# Patient Record
Sex: Male | Born: 1945
Health system: Southern US, Community
[De-identification: ages and names within clinical notes are randomized; demographics above are authoritative.]

## PROBLEM LIST (undated history)

## (undated) DIAGNOSIS — Z87442 Personal history of urinary calculi: Secondary | ICD-10-CM

## (undated) DIAGNOSIS — I219 Acute myocardial infarction, unspecified: Secondary | ICD-10-CM

## (undated) DIAGNOSIS — I10 Essential (primary) hypertension: Secondary | ICD-10-CM

## (undated) DIAGNOSIS — I739 Peripheral vascular disease, unspecified: Secondary | ICD-10-CM

## (undated) DIAGNOSIS — Z8546 Personal history of malignant neoplasm of prostate: Secondary | ICD-10-CM

## (undated) DIAGNOSIS — C61 Malignant neoplasm of prostate: Secondary | ICD-10-CM

## (undated) DIAGNOSIS — I2510 Atherosclerotic heart disease of native coronary artery without angina pectoris: Secondary | ICD-10-CM

## (undated) DIAGNOSIS — G709 Myoneural disorder, unspecified: Secondary | ICD-10-CM

## (undated) DIAGNOSIS — K219 Gastro-esophageal reflux disease without esophagitis: Secondary | ICD-10-CM

## (undated) DIAGNOSIS — I619 Nontraumatic intracerebral hemorrhage, unspecified: Secondary | ICD-10-CM

## (undated) DIAGNOSIS — R9439 Abnormal result of other cardiovascular function study: Secondary | ICD-10-CM

## (undated) DIAGNOSIS — I714 Abdominal aortic aneurysm, without rupture, unspecified: Secondary | ICD-10-CM

## (undated) DIAGNOSIS — I639 Cerebral infarction, unspecified: Secondary | ICD-10-CM

## (undated) DIAGNOSIS — E785 Hyperlipidemia, unspecified: Secondary | ICD-10-CM

## (undated) DIAGNOSIS — J449 Chronic obstructive pulmonary disease, unspecified: Secondary | ICD-10-CM

## (undated) HISTORY — DX: Nontraumatic intracerebral hemorrhage, unspecified: I61.9

## (undated) HISTORY — DX: Hyperlipidemia, unspecified: E78.5

## (undated) HISTORY — DX: Chronic obstructive pulmonary disease, unspecified: J44.9

## (undated) HISTORY — DX: Malignant neoplasm of prostate: C61

## (undated) HISTORY — PX: SPINE SURGERY: SHX786

## (undated) HISTORY — DX: Peripheral vascular disease, unspecified: I73.9

## (undated) HISTORY — PX: FOOT SURGERY: SHX648

## (undated) HISTORY — DX: Personal history of urinary calculi: Z87.442

## (undated) HISTORY — DX: Personal history of malignant neoplasm of prostate: Z85.46

## (undated) HISTORY — DX: Abdominal aortic aneurysm, without rupture: I71.4

## (undated) HISTORY — PX: HERNIA REPAIR: SHX51

## (undated) HISTORY — PX: BACK SURGERY: SHX140

## (undated) HISTORY — DX: Myoneural disorder, unspecified: G70.9

## (undated) HISTORY — DX: Abdominal aortic aneurysm, without rupture, unspecified: I71.40

## (undated) HISTORY — DX: Abnormal result of other cardiovascular function study: R94.39

## (undated) HISTORY — DX: Essential (primary) hypertension: I10

## (undated) HISTORY — DX: Atherosclerotic heart disease of native coronary artery without angina pectoris: I25.10

## (undated) HISTORY — PX: KNEE SURGERY: SHX244

---

## 2000-04-17 HISTORY — PX: CARDIAC CATHETERIZATION: SHX172

## 2001-03-21 ENCOUNTER — Inpatient Hospital Stay (HOSPITAL_COMMUNITY): Admission: EM | Admit: 2001-03-21 | Discharge: 2001-03-25 | Payer: Self-pay | Admitting: Cardiology

## 2001-03-24 ENCOUNTER — Encounter: Payer: Self-pay | Admitting: Cardiology

## 2004-02-25 ENCOUNTER — Ambulatory Visit: Payer: Self-pay | Admitting: Internal Medicine

## 2004-03-07 ENCOUNTER — Ambulatory Visit: Payer: Self-pay | Admitting: Internal Medicine

## 2004-07-01 ENCOUNTER — Ambulatory Visit: Payer: Self-pay | Admitting: Internal Medicine

## 2004-12-30 ENCOUNTER — Ambulatory Visit: Payer: Self-pay | Admitting: Internal Medicine

## 2005-02-16 ENCOUNTER — Ambulatory Visit: Payer: Self-pay | Admitting: Cardiology

## 2005-07-04 ENCOUNTER — Ambulatory Visit: Payer: Self-pay | Admitting: Internal Medicine

## 2005-08-18 ENCOUNTER — Ambulatory Visit: Payer: Self-pay | Admitting: Cardiology

## 2006-01-19 ENCOUNTER — Ambulatory Visit: Payer: Self-pay | Admitting: Internal Medicine

## 2006-01-29 ENCOUNTER — Ambulatory Visit: Payer: Self-pay | Admitting: Cardiology

## 2006-10-22 ENCOUNTER — Ambulatory Visit: Payer: Self-pay | Admitting: Internal Medicine

## 2006-10-26 ENCOUNTER — Ambulatory Visit: Payer: Self-pay | Admitting: Unknown Physician Specialty

## 2007-01-25 ENCOUNTER — Ambulatory Visit: Payer: Self-pay | Admitting: Unknown Physician Specialty

## 2007-02-14 ENCOUNTER — Ambulatory Visit: Payer: Self-pay | Admitting: Cardiology

## 2007-09-27 ENCOUNTER — Encounter: Admission: RE | Admit: 2007-09-27 | Discharge: 2007-09-27 | Payer: Self-pay | Admitting: Internal Medicine

## 2007-10-25 ENCOUNTER — Ambulatory Visit: Payer: Self-pay | Admitting: Cardiology

## 2007-10-30 ENCOUNTER — Ambulatory Visit: Payer: Self-pay

## 2007-12-10 ENCOUNTER — Ambulatory Visit (HOSPITAL_COMMUNITY): Admission: RE | Admit: 2007-12-10 | Discharge: 2007-12-11 | Payer: Self-pay | Admitting: Neurosurgery

## 2008-08-04 ENCOUNTER — Telehealth: Payer: Self-pay | Admitting: Cardiology

## 2008-08-05 DIAGNOSIS — E785 Hyperlipidemia, unspecified: Secondary | ICD-10-CM | POA: Insufficient documentation

## 2008-08-05 DIAGNOSIS — I10 Essential (primary) hypertension: Secondary | ICD-10-CM

## 2008-08-05 DIAGNOSIS — K219 Gastro-esophageal reflux disease without esophagitis: Secondary | ICD-10-CM | POA: Insufficient documentation

## 2008-08-05 DIAGNOSIS — D126 Benign neoplasm of colon, unspecified: Secondary | ICD-10-CM

## 2008-08-28 ENCOUNTER — Encounter: Payer: Self-pay | Admitting: Cardiology

## 2008-08-28 ENCOUNTER — Encounter: Admission: RE | Admit: 2008-08-28 | Discharge: 2008-08-28 | Payer: Self-pay | Admitting: Neurosurgery

## 2008-10-18 DIAGNOSIS — I2510 Atherosclerotic heart disease of native coronary artery without angina pectoris: Secondary | ICD-10-CM | POA: Insufficient documentation

## 2008-10-20 ENCOUNTER — Ambulatory Visit: Payer: Self-pay | Admitting: Cardiology

## 2008-11-30 ENCOUNTER — Encounter: Payer: Self-pay | Admitting: Cardiology

## 2009-01-08 ENCOUNTER — Telehealth (INDEPENDENT_AMBULATORY_CARE_PROVIDER_SITE_OTHER): Payer: Self-pay | Admitting: *Deleted

## 2009-02-19 ENCOUNTER — Encounter: Payer: Self-pay | Admitting: Cardiology

## 2009-05-07 ENCOUNTER — Encounter: Payer: Self-pay | Admitting: Cardiology

## 2009-05-31 ENCOUNTER — Ambulatory Visit: Payer: Self-pay | Admitting: Cardiology

## 2009-06-11 ENCOUNTER — Encounter: Payer: Self-pay | Admitting: Cardiology

## 2009-06-23 ENCOUNTER — Encounter: Payer: Self-pay | Admitting: Cardiology

## 2009-07-20 ENCOUNTER — Ambulatory Visit: Admission: RE | Admit: 2009-07-20 | Discharge: 2009-10-18 | Payer: Self-pay | Admitting: Radiation Oncology

## 2009-07-21 ENCOUNTER — Encounter: Payer: Self-pay | Admitting: Cardiology

## 2009-08-02 ENCOUNTER — Ambulatory Visit: Payer: Medicare Other | Admitting: Unknown Physician Specialty

## 2009-08-13 ENCOUNTER — Encounter: Admission: RE | Admit: 2009-08-13 | Discharge: 2009-08-13 | Payer: Self-pay | Admitting: Urology

## 2009-09-15 HISTORY — PX: PROSTATE SURGERY: SHX751

## 2009-09-29 ENCOUNTER — Ambulatory Visit (HOSPITAL_BASED_OUTPATIENT_CLINIC_OR_DEPARTMENT_OTHER): Admission: RE | Admit: 2009-09-29 | Discharge: 2009-09-29 | Payer: Self-pay | Admitting: Urology

## 2009-10-22 ENCOUNTER — Ambulatory Visit: Admission: RE | Admit: 2009-10-22 | Discharge: 2010-01-20 | Payer: Self-pay | Admitting: Radiation Oncology

## 2009-10-27 ENCOUNTER — Encounter: Payer: Self-pay | Admitting: Cardiology

## 2009-11-11 ENCOUNTER — Ambulatory Visit (HOSPITAL_COMMUNITY): Admission: RE | Admit: 2009-11-11 | Discharge: 2009-11-11 | Payer: Self-pay | Admitting: Radiation Oncology

## 2010-03-07 ENCOUNTER — Ambulatory Visit: Payer: Self-pay | Admitting: Cardiology

## 2010-05-08 ENCOUNTER — Encounter: Payer: Self-pay | Admitting: Urology

## 2010-05-17 NOTE — Letter (Signed)
Summary: [facility] Office Note  [facility] Office Note   Imported By: Roderic Ovens 04/28/2009 13:06:21  _____________________________________________________________________  External Attachment:    Type:   Image     Comment:   External Document

## 2010-05-17 NOTE — Letter (Signed)
Summary: [facility] Office Note  [facility] Office Note   Imported By: Roderic Ovens 07/14/2009 14:23:11  _____________________________________________________________________  External Attachment:    Type:   Image     Comment:   External Document

## 2010-05-17 NOTE — Assessment & Plan Note (Signed)
Summary: rov/9 month f/u   Visit Type:  Follow-up Primary Provider:  Duncan Dull [place]   History of Present Illness: The patient is 65 years old and return for management of CAD. He had a posterior MI in 2002 treated with a bare-metal stent to the circumflex artery. I saw him last July as a preoperative evaluation prior to back surgery by Dr. Noel Gerold and he had a negative Myoview scan at that time with an ejection fraction of 60%.He had another preop evaluation in February 2011 prior to prostate biopsy and he ended up having a seed implantation. He's been off Plavix since that time.  He has been doing well from the standpoint of his heart with no chest pain shortness breath or palpitations.  His main limitations are related to his back and he has had 2 surgeries on his back.  Current Medications (verified): 1)  Benazepril-Hydrochlorothiazide 20-25 Mg Tabs (Benazepril-Hydrochlorothiazide) .[name].. 1 Tab Q Day 2)  Metoprolol Succinate 25 Mg Xr24h-Tab (Metoprolol Succinate) .... Take 1 Tablet By Mouth Once A Day 3)  Advair Diskus .... 2 Puffs Daily 4)  Spiriva Inhaler .[name].. 1 Puff Qday 5)  Omeprazole 40 Mg Cpdr (Omeprazole) .... Once Daily 6)  Aspirin 81 Mg Tbec (Aspirin) .... Take One Tablet By Mouth Daily 7)  Multi Vitamin .... Once Daily 8)  Simvastatin 80 Mg Tabs (Simvastatin) .... Take One Tablet By Mouth Daily At Bedtime 9)  Nitrostat 0.4 Mg Subl (Nitroglycerin) .... Take As Directed 10)  Lyrica 75 Mg Caps (Pregabalin) .[name].. 1 Tab Bid 11)  Tramadol Hcl 50 Mg Tabs (Tramadol Hcl) .... As Directed 12)  Ventolin Hfa 108 (90 Base) Mcg/act Aers (Albuterol Sulfate) .... Uad 13)  Tamsulosin Hcl 0.4 Mg Caps (Tamsulosin Hcl) .... Once Daily 14)  Methocarbamol 500 Mg Tabs (Methocarbamol) .... Uad 15)  Meloxicam 15 Mg Tabs (Meloxicam) .... Once Daily  Allergies (verified): No Known Drug Allergies  Past History:  Past Medical History: Reviewed history from 10/18/2008 and no changes  required. Current Problems:  COLONIC POLYPS (ICD-211.3) GERD (ICD-530.81) HYPERTENSION (ICD-401.9) HYPERLIPIDEMIA (ICD-272.4) 2. Coronary artery disease, status post posterior wall myocardial     infarction treated with a bare-metal stent to the circumflex artery     in 2002, now stable. 3. Good left ventricular function. 4. Continued cigarette use. 5. Hypertension. 6. Hyperlipidemia.   Notable for hypertension and hyperlipidemia. There is a history of left cerebral CVA in approximately 1990 with relatively minor symptoms.  Review of Systems       ROS is negative except as outlined in HPI.   Vital Signs:  Patient profile:   64 year old male Height:      72 inches Weight:      205 pounds BMI:     27.90 Pulse rate:   75 / minute BP sitting:   100 / 60  (left arm)  Vitals Entered By: Laurance Flatten CMA (March 07, 2010 4:36 PM)  Physical Exam  Additional Exam:  Gen. Well-nourished, in no distress   Neck: No JVD, thyroid not enlarged, no carotid bruits Lungs: No tachypnea, clear without rales, rhonchi or wheezes Cardiovascular: Rhythm regular, PMI not displaced,  heart sounds  normal, no murmurs or gallops, no peripheral edema, pulses normal in all 4 extremities. Abdomen: BS normal, abdomen soft and non-tender without masses or organomegaly, no hepatosplenomegaly. MS: No deformities, no cyanosis or clubbing   Neuro:  No focal sns   Skin:  no lesions    Impression & Recommendations:  Problem # 1:  CAD, NATIVE VESSEL (ICD-414.01) He had a bare-metal stent to the RCA in 2002 for a posterior MI. He's had no chest pain. His ECG is normal. This problem is stable. He also had a negative Myoview scan last year. His updated medication list for this problem includes:    Benazepril-hydrochlorothiazide 20-25 Mg Tabs (Benazepril-hydrochlorothiazide) .[name]... 1 tab q day    Metoprolol Succinate 25 Mg Xr24h-tab (Metoprolol succinate) .[name]... Take 1 tablet by mouth once a day    Aspirin 81  Mg Tbec (Aspirin) .[name]... Take one tablet by mouth daily    Nitrostat 0.4 Mg Subl (Nitroglycerin) .[name]... Take as directed  Orders: EKG w/ Interpretation (93000)  Problem # 2:  HYPERTENSION (ICD-401.9) This is well controlled on current medications. His updated medication list for this problem includes:    Benazepril-hydrochlorothiazide 20-25 Mg Tabs (Benazepril-hydrochlorothiazide) .[name]... 1 tab q day    Metoprolol Succinate 25 Mg Xr24h-tab (Metoprolol succinate) .[name]... Take 1 tablet by mouth once a day    Aspirin 81 Mg Tbec (Aspirin) .[name]... Take one tablet by mouth daily  Problem # 3:  HYPERLIPIDEMIA (ICD-272.4) He plans to have a lipid profile with his primary care physician next month. His updated medication list for this problem includes:    Simvastatin 80 Mg Tabs (Simvastatin) .[name]... Take one tablet by mouth daily at bedtime  Patient Instructions: 1)  Your physician recommends that you continue on your current medications as directed. Please refer to the Current Medication list given to you today. 2)  Your physician wants you to follow-up in:  1 year with Dr. Mariah Milling in [place]. You will receive a reminder letter in the mail two months in advance. If you don't receive a letter, please call our office to schedule the follow-up appointment.

## 2010-05-17 NOTE — Letter (Signed)
Summary: [facility] Office Visit Note   [facility] Office Visit Note   Imported By: Roderic Ovens 11/23/2009 13:03:09  _____________________________________________________________________  External Attachment:    Type:   Image     Comment:   External Document

## 2010-05-17 NOTE — Letter (Signed)
[facility]   Imported By: Marylou Mccoy 10/06/2009 18:53:35  _____________________________________________________________________  External Attachment:    Type:   Image     Comment:   External Document

## 2010-05-17 NOTE — Assessment & Plan Note (Signed)
Summary: surgical clearance   Visit Type:  Pre-op Evaluation Primary Provider:  Duncan Dull [place]  CC:  no cardiac complaints Pt need to  off plavixfor prodecure.  History of Present Illness: the patient is 65 years old and return for management of CAD. He had a posterior MI in 2002 treated with a bare-metal stent to the circumflex artery. I saw him last July as a preoperative evaluation prior to back surgery by Dr. Noel Gerold and he had a negative Myoview scan at that time with an ejection fraction of 60%.  He has done well since that time has had no recent chest pain shortness of breath or palpitations.  He comes in now for preoperative assessment prior to prostate biopsy. They would like him to come off his Plavix 5 days before the biopsy. He is also scheduled for colonoscopy and endoscopy by Dr. Mechele Collin in [place] in April or May and they would like him to come off Plavix for that as well.  Current Medications (verified): 1)  Benazepril-Hydrochlorothiazide 20-25 Mg Tabs (Benazepril-Hydrochlorothiazide) .[name].. 1 Tab Q Day 2)  Metoprolol Succinate 25 Mg Xr24h-Tab (Metoprolol Succinate) .... Take 1 Tablet By Mouth Once A Day 3)  Advair Diskus .... 2 Puffs Daily 4)  Spiriva Inhaler .[name].. 1 Puff Qday 5)  Nexium 40 Mg Cpdr (Esomeprazole Magnesium) .[name].. 1 Tab Once Daily 6)  Plavix 75 Mg Tabs (Clopidogrel Bisulfate) .... Take One Tablet By Mouth Daily 7)  Aspirin 81 Mg Tbec (Aspirin) .... Take One Tablet By Mouth Daily 8)  Multi Vitamin .... Once Daily 9)  Simvastatin 80 Mg Tabs (Simvastatin) .... Take One Tablet By Mouth Daily At Bedtime 10)  Nitrostat 0.4 Mg Subl (Nitroglycerin) .... Take As Directed 11)  Lyrica 75 Mg Caps (Pregabalin) .[name].. 1 Tab Bid 12)  Tramadol Hcl 50 Mg Tabs (Tramadol Hcl) .... As Directed  Allergies (verified): No Known Drug Allergies  Past History:  Past Medical History: Reviewed history from 10/18/2008 and no changes required. Current Problems:  COLONIC  POLYPS (ICD-211.3) GERD (ICD-530.81) HYPERTENSION (ICD-401.9) HYPERLIPIDEMIA (ICD-272.4) 2. Coronary artery disease, status post posterior wall myocardial     infarction treated with a bare-metal stent to the circumflex artery     in 2002, now stable. 3. Good left ventricular function. 4. Continued cigarette use. 5. Hypertension. 6. Hyperlipidemia.   Notable for hypertension and hyperlipidemia. There is a history of left cerebral CVA in approximately 1990 with relatively minor symptoms.  Review of Systems       ROS is negative except as outlined in HPI.   Vital Signs:  Patient profile:   65 year old male Height:      72 inches Weight:      201 pounds Pulse rate:   73 / minute BP sitting:   114 / 69  (left arm) Cuff size:   regular  Vitals Entered By: Burnett Kanaris, CNA (May 31, 2009 11:11 AM)  Physical Exam  Additional Exam:  Gen. Well-nourished, in no distress   Neck: No JVD, thyroid not enlarged, no carotid bruits Lungs: No tachypnea, clear without rales, rhonchi or wheezes Cardiovascular: Rhythm regular, PMI not displaced,  heart sounds  normal, no murmurs or gallops, no peripheral edema, pulses normal in all 4 extremities. Abdomen: BS normal, abdomen soft and non-tender without masses or organomegaly, no hepatosplenomegaly. MS: No deformities, no cyanosis or clubbing   Neuro:  No focal sns   Skin:  no lesions    Impression & Recommendations:  Problem # 1:  CAD, NATIVE VESSEL (ICD-414.01) He had a remote posterior MI in 2002 treated with a bare-metal stent and has been stable since that time. I think at this point the benefit of Plavix and the bleeding risk of Plavix are pretty similar in that he could come off Plavix. We'll have him stop this when he finishes his current supply. This means he will be off it for his prostate biopsy and for his endoscopy procedures. The following medications were removed from the medication list:    Plavix 75 Mg Tabs (Clopidogrel  bisulfate) .[name]... Take one tablet by mouth daily His updated medication list for this problem includes:    Benazepril-hydrochlorothiazide 20-25 Mg Tabs (Benazepril-hydrochlorothiazide) .[name]... 1 tab q day    Metoprolol Succinate 25 Mg Xr24h-tab (Metoprolol succinate) .[name]... Take 1 tablet by mouth once a day    Aspirin 81 Mg Tbec (Aspirin) .[name]... Take one tablet by mouth daily    Nitrostat 0.4 Mg Subl (Nitroglycerin) .[name]... Take as directed  Problem # 2:  PREOPERATIVE EXAMINATION (ICD-V72.84) He is scheduled for a prostate biopsy and if this is positive he may need surgery. Clinically he has been stable with no recent chest pain. I don't think he needs any further evaluation prior to the biopsy but if he should require prostate surgery I think we should repeat his perfusion scan. He is to let us know if he will need any further surgery.  Problem # 3:  HYPERTENSION (ICD-401.9) This is well controlled on current medications. His updated medication list for this problem includes:    Benazepril-hydrochlorothiazide 20-25 Mg Tabs (Benazepril-hydrochlorothiazide) .[name]... 1 tab q day    Metoprolol Succinate 25 Mg Xr24h-tab (Metoprolol succinate) .[name]... Take 1 tablet by mouth once a day    Aspirin 81 Mg Tbec (Aspirin) .[name]... Take one tablet by mouth daily  His updated medication list for this problem includes:    Benazepril-hydrochlorothiazide 20-25 Mg Tabs (Benazepril-hydrochlorothiazide) .[name]... 1 tab q day    Metoprolol Succinate 25 Mg Xr24h-tab (Metoprolol succinate) .[name]... Take 1 tablet by mouth once a day    Aspirin 81 Mg Tbec (Aspirin) .[name]... Take one tablet by mouth daily  Problem # 4:  HYPERLIPIDEMIA (ICD-272.4)  Tis currently being managed with simvastatin. His updated medication list for this problem includes:    Simvastatin 80 Mg Tabs (Simvastatin) .[name]... Take one tablet by mouth daily at bedtime  His updated medication list for this problem includes:    Simvastatin 80 Mg Tabs (Simvastatin) .[name]...  Take one tablet by mouth daily at bedtime  Patient Instructions: 1)  Your physician wants you to follow-up in: 9 months (Nov. 2011)  You will receive a reminder letter in the mail two months in advance. If you don't receive a letter, please call our office to schedule the follow-up appointment. 2)  Your physician has recommended you make the following change in your medication: 1) you may stop plavix when you finish your current dose. 3)  If you require surgery for your prostate, Dr. Juanda Chance will want you to have a stress test prior to giving cardiac clearance for this.

## 2010-05-17 NOTE — Letter (Signed)
Summary: [facility] Office Note  [facility] Office Note   Imported By: Roderic Ovens 07/14/2009 14:21:29  _____________________________________________________________________  External Attachment:    Type:   Image     Comment:   External Document

## 2010-05-17 NOTE — Letter (Signed)
Summary: [facility] Office Note  [facility] Office Note   Imported By: Roderic Ovens 07/20/2009 10:48:11  _____________________________________________________________________  External Attachment:    Type:   Image     Comment:   External Document

## 2010-07-04 LAB — COMPREHENSIVE METABOLIC PANEL
ALT: 16 U/L (ref 0–53)
Albumin: 3.9 g/dL (ref 3.5–5.2)
Alkaline Phosphatase: 88 U/L (ref 39–117)
Chloride: 102 mEq/L (ref 96–112)
Glucose, Bld: 110 mg/dL — ABNORMAL HIGH (ref 70–99)
Potassium: 4 mEq/L (ref 3.5–5.1)
Sodium: 136 mEq/L (ref 135–145)
Total Protein: 6.7 g/dL (ref 6.0–8.3)

## 2010-07-04 LAB — CBC
Hemoglobin: 14 g/dL (ref 13.0–17.0)
RDW: 12.4 % (ref 11.5–15.5)
WBC: 11.5 10*3/uL — ABNORMAL HIGH (ref 4.0–10.5)

## 2010-07-04 LAB — PROTIME-INR: Prothrombin Time: 14 seconds (ref 11.6–15.2)

## 2010-08-30 NOTE — Assessment & Plan Note (Signed)
[facility]                            CARDIOLOGY OFFICE NOTE   [name], [name]                       MRN:          [id number]  DATE:02/14/2007                            DOB:          14-Feb-1946    PRIMARY CARE PHYSICIAN:  Dr. Duncan Dull in [place].   CLINICAL HISTORY:  [name] returned for followup management of his  coronary heart disease.  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  He had a  documented apical  infarction in 2002 treated with a bare metal stent to  the circumflex artery.  His LV function is good.  He has been doing  quite well from a cardiac standpoint.  He has had no chest pain,  shortness of breath or palpitations.   PAST MEDICAL HISTORY:  1. Hypertension.  2. Hyperlipidemia.  3. GERD.  4. Colon polyps for which he recently had colonoscopy.   Dr. Darrick Huntsman recently adjusted his medications for his hypertension.  Unfortunately, he has continued to smoke although less than he had a few  years ago.   CURRENT MEDICATIONS:  Aspirin, Plavix, Nexium, Vytorin,  Lotensin/hydrochlorothiazide which is new, Toprol, Spiriva and Advair.   EXAMINATION:  The blood pressure is 116/72 and the pulse 74 and regular.  There was no venous distention.  The carotid pulses were full without  bruits.  CHEST:  Clear.  CARDIAC:  Rhythm was regular.  I could hear no murmurs or gallops.  ABDOMEN:  Soft without organomegaly.  Peripheral pulses were full with no peripheral edema.   An electrocardiogram showed Q wave in 3 and nonspecific ST-T changes.   IMPRESSION:  1. Coronary artery disease status post posterior wall myocardial      infarction treated with a bare metal stent to the circumflex artery      in 2002, now stable.  2. Good left ventricular function.  3. Continued cigarette use.  4. Hyperlipidemia.  5. Hypertension.   RECOMMENDATIONS:  I think [name] is  doing well from the standpoint  of his heart.  Unfortunately he has continued to smoke which is his main  uncontrolled risk factor.  His blood  pressure is much better.  His cholesterol has been good in the past but  his HDL has been low and we will get his recent readings from Dr.  Melina Schools office.  I will plan to see him back in a year.     Nathaniel Elvera Lennox Juanda Chance, MD, [facility]  Electronically Signed    BRB/MedQ  DD: 02/14/2007  DT: 02/14/2007  Job #: [id number]   cc:   Duncan Dull, M.D.

## 2010-08-30 NOTE — Assessment & Plan Note (Signed)
[facility]                            CARDIOLOGY OFFICE NOTE   [name], [name]                       MRN:          [id number]  DATE:10/25/2007                            DOB:          April 11, 1946    REFERRING PHYSICIAN:  Marlowe Ramirez, M.D.   PRIMARY CARE PHYSICIAN:  Nathaniel Dull, MD   REASON FOR REFERRAL:  Preop evaluation prior to back surgery.   CLINICAL HISTORY:  [name] is 65 years old and comes in today for a  preop evaluation prior to probable back surgery.  He has coronary heart  disease and had a myocardial infarction treated with a bare-metal stent  to the circumflex artery in 2002.  He has good LV function.  He has had  no recent symptoms of chest pain, shortness of breath, or palpitations.   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  He has had an MRI, which has showed protruded  disk and he has been referred by worker's compensation to Dr. Simonne Ramirez  for possible surgery.   PAST MEDICAL HISTORY:  Significant for;  1. Hypertension.  2. Hyperlipidemia.  3. GERD.  4. Colon polyps.   CURRENT MEDICATIONS:  1. Plavix 75 mg daily.  2. Aspirin 81 mg daily.  3. Nexium 40 mg daily.  4. Spiriva inhalers.  5. Advair inhaler.  6. Lotensin 20/25 daily.  7. Simvastatin 80 mg nightly.  8. Robaxin.  9. Hydrocodone.  10.Toprol-XL 25 mg daily.   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  He still  smokes 2 packs of cigarettes a day.  He is married.   FAMILY HISTORY:  Positive and he has a brother who had bypass surgery  and his mother who had a heart attack.   REVIEW OF SYSTEMS:  Positive for low back pain and also knee pain.   PHYSICAL EXAMINATION:  VITAL SIGNS:  Blood pressure is 123/78, pulse 85  and regular.  NECK:  There was no venous distension.  The carotid pulses were full and  there are no bruits.  CHEST:  Clear without rales or rhonchi.  CARDIAC:  Rhythm was regular.  The first and second heart sounds were  normal.  There were no murmurs or gallops.  ABDOMEN:  Soft with normal bowel sounds.  There was no  hepatosplenomegaly.  Peripheral pulses were full and there was no  peripheral edema.  MUSCULOSKELETAL:  Showed no deformities.  SKIN:  Warm and dry.  NEUROLOGIC:  No focal neurological signs.   Electrocardiogram was normal.   IMPRESSION:  1. Preoperative evaluation prior to back surgery.  2. Coronary artery disease, status post posterior wall myocardial      infarction treated with a bare-metal stent to the circumflex artery      in 2002, now stable.  3. Good left ventricular function.  4. Continued cigarette use.  5. Hypertension.  6. Hyperlipidemia.   RECOMMENDATIONS:  I think [name] should be evaluated with  an  adenosine rest stress Myoview scan prior to surgery.  If the scan is low  risk, then I think his risk of general anesthesia for surgery should be  low and I think it is reasonable to proceed.  I would recommend stopping  the Plavix 5 days prior to surgery.  I would prefer to continue the  aspirin through surgery unless Dr. Simonne Ramirez feels that the bleeding  risk will be increased too much.  I think continuing the aspirin would  decrease his cardiovascular risk.  I would recommend continuing Toprol  through surgery.  I will be available for any problems.     Nathaniel Elvera Lennox Juanda Chance, MD, [facility]  Electronically Signed    BRB/MedQ  DD: 10/25/2007  DT: 10/26/2007  Job #: [id number]

## 2010-08-30 NOTE — Op Note (Signed)
NAME:  [name], [name] NO.:  [id number]   MEDICAL RECORD NO.:  [id number]          PATIENT TYPE:  OIB   LOCATION:  3599                         FACILITY:  [facility]   PHYSICIAN:  Reinaldo Meeker, M.D. DATE OF BIRTH:  08/11/1945   DATE OF PROCEDURE:  12/10/2007  DATE OF DISCHARGE:                               OPERATIVE REPORT   PREOPERATIVE DIAGNOSES:  1. Herniated disk L4-L5.  2. Right superior fragment with compression of L4-L5 nerve roots.   POSTOPERATIVE DIAGNOSES:  1. Herniated disk L4-L5.  2. Right superior fragment with compression of L4-L5 nerve roots.   PROCEDURE:  Right L4-L5 interlaminar laminotomy for excision of  herniated disk and decompression of L4-L5 nerve roots on the right.   SURGEON:  Reinaldo Meeker, MD   ASSISTANT:  Kathaleen Maser. Pool, MD   PROCEDURE IN DETAIL:  After placing in the prone position, the patient's  back was prepped and draped in the usual sterile fashion.  Localizing x-  ray was taken prior to incision to identify the appropriate level.  Midline incision was made over the spinous processes of L4-L5.  Using  Bovie cutting current, the incision was carried down the spinous  processes.  Subperiosteal dissection was then carried out on the right-  sided spinous processes and lamina.  Self-retaining retractor was placed  for exposure.  X-rays showed we approached the appropriate level.  Using  high-speed drill, the inferior 80% of the L4 lamina, medial one-third of  facet joint, and the superior one-third of the L5 lamina were removed.  L4-L5 nerve roots were both decompressed and visualized.  Microscope was  draped, brought into the field, and used for the remainder of the case.  Using microdissection technique, the lateral aspect of the thecal sac  and L5 nerve were identified.  Further coagulation was carried out down  to the floor of the canal to identify the L4-L5 disk, which was found to  be remarkably herniated with a  superior fragment.  The disk space was  incised with a #15 blade and thoroughly cleaned out.  At that time,  inspection was carried out superior to disk space where herniated disk  material was seen up towards the L4 nerve root.  This was removed in  order to decompress the L4 nerve root and then some medial disk was  removed as well as to decompress the thecal sac.  At this time,  inspection was carried out in all directions for any evidence of  residual compression and none could be identified.  Large amounts of  irrigation were carried out.  Any bleeding controlled with bipolar  coagulation and Gelfoam.  The wound was then closed in multiple layers  of Vicryl in the muscle, fascia, subcutaneous, and subcuticular tissues  and staples were placed on the skin.  A sterile dressing was then  applied.  The patient was extubated and taken to recovery room in stable  condition.          ______________________________  Reinaldo Meeker, M.D.    ROK/MEDQ  D:  12/10/2007  T:  12/11/2007  Job:  [id number]

## 2010-09-02 NOTE — H&P (Signed)
[place]. [facility]  Patient:    [name], [name] Visit Number: [id number] MRN: [id number]          Service Type: MED Location: CCUA 2922 [id number] Attending Physician:  Lenoria Farrier Dictated by:   Gerrit Friends. Dietrich Pates, M.D. [facility] Proc. Date: 03/21/01 Admit Date:  03/21/2001                           History and Physical  CHIEF COMPLAINT: The patient is a 65 year old gentleman, transferred from [facility] with acute inferior myocardial infarction.  The patient has experienced intermittent anterior chest tightness with some sharp components for the past week.  This evening, he noted the onset of more severe symptoms associated with dyspnea, diaphoresis, and nausea. There was radiation to the neck. The pain was intense prompting him to immediately present to the emergency department where initial ECG demonstrated slight ST segment elevation in the inferior leads that was not clearly recognized as significant. He was ultimately started on heparin, aspirin, and intravenous nitroglycerin without much improvement in pain.  A repeat ECG demonstrated more impressive inferior ST segment elevation with reciprocal ST segment depression in the anterior leads. Initial cardiac markers were positive. Integrilin was added to his medical regimen and a request made for transfer to [facility] for acute intervention. Time of onset of chest pain was approximately 5:30 p.m.  PAST MEDICAL HISTORY: Notable for hypertension and hyperlipidemia. There is a history of left cerebral CVA in approximately 1990 with relatively minor symptoms.  CURRENT MEDICATIONS: Include, Prevacid, amlodipine, and aspirin.  ALLERGIES: He is unaware of any drug allergies.  SOCIAL HISTORY: Long-standing cigarette smoker; no history of excessive alcohol use. Married and lives with wife.  FAMILY HISTORY: Positive for coronary artery disease.  REVIEW OF SYSTEMS: Notable for  intermittent cough and sputum as well as intermittent epigastric discomfort.  PHYSICAL EXAMINATION:  GENERAL: On physical examination, a well appearing gentleman in mild discomfort with no respiratory distress.  VITAL SIGNS: Heart rate is 70 and regular, blood pressure 110/60, respirations 20, afebrile.  HEENT: Normal oral mucosa.  NECK: No jugular venous distention; no carotid bruits.  ENDOCRINE: No thyromegaly.  SKIN: No significant lesions.  LUNGS: Clear.  CARDIAC: Normal first and second heart sounds.  ABDOMEN: Benign without organomegaly.  EXTREMITIES: No edema. Distal pulses intact.  NEUROMUSCULAR: Normal strength and tone.  LABORATORY AND ACCESSORY DATA: Chest x-ray: Increased perihilar markings by report. ECG: Normal sinus rhythm; 1-2 mm of inferior ST segment elevation with ST segment depression and T wave inversion in V2-V5.  CPK - 492 with MB of 33 and troponin of 5.3.  IMPRESSION: Acute myocardial infarction with onset of symptoms approximately 5 hours prior to arrival at [facility]. Acute intervention is warranted. Dr. Juanda Chance present and will take patient immediately to the cardiac catheterization laboratory. Dictated by:   Gerrit Friends. Dietrich Pates, M.D. [facility] Attending Physician:  Lenoria Farrier DD:  03/21/01 TD:  03/22/01 Job: [id number]

## 2010-09-02 NOTE — Discharge Summary (Signed)
[place]. [facility]  Patient:    BLUE, WINTHER Visit Number: [id number] MRN: [id number]          Service Type: MED Location: [phone] Attending Physician:  Lenoria Farrier Dictated by:   Lavella Hammock, P.A. Admit Date:  03/21/2001 Discharge Date: 03/25/2001   CC:         Dr. Zollie Beckers   Referring Physician Discharge [place]  DATE OF BIRTH:  [birth year]  PROCEDURES: 1. Cardiac catheterization. 2. Coronary arteriogram. 3. Left ventriculogram. 4. PTCA and stent to one vessel.  HOSPITAL COURSE:  Mr. Urieta is a 65 year old male who presented to [facility] with sharp chest pain that started about 5:40 p.m. on the day of admission.  This pain was associated with slight inferior ST elevation and dyspnea.  He was treated with aspirin, heparin, Integrilin, and nitroglycerin, but his pain continued.  He was transferred to [facility] for further evaluation and cardiac catheterization.  He had a cardiac catheterization on March 21, 2001 that showed a normal left main, an LAD with a 40% mid stenosis, and a circumflex that was totaled in the mid portion. Distal to the occlusion was a 50% stenosis.  The RCA had no significant disease.  He had inferior hypokinesis on the left ventriculogram with an EF of approximately 55%.  He had PTCA and stent to the circumflex, reducing the stenosis from 100% to 10% with TIMI 3 flow.  The patient tolerated the procedure well and the sheath was removed without difficulty.  He was seen by cardiac rehab and ambulated without difficulty.  From a cardiac standpoint he continued to do well and was ready for discharge on March 25, 2001.  An additional problem was congestive heart failure.  The patients initial chest x-ray showed pulmonary vascular congestion and mild interstitial edema. There were no effusions.  The patient was diuresed with Lasix - both IV and p.o. - and a repeat chest x-ray  on March 24, 2001 showed resolution of CHF with minimal left basilar atelectasis.  The patient had some orthostatic symptoms and his vital signs were checked and his blood pressure went from 90/46 while lying down to 70/32 while sitting and then was palpated at 70 while standing.  The patient was symptomatic with this.  The patient had IV fluids started and by the next day, his orthostatics were rechecked and were negative.  The patient additionally had a history of hypertension and hyperlipidemia. His lipid profile was checked and the only abnormality was an HDL of 35.  He had been on Zocor prior to admission and this was continued.  The patient had a history of hypertension but was actually hypotensive in the hospital.  He was taken off his blood pressure medications and he is to be started back on medications as an outpatient if his blood pressure and heart rate will tolerate.  The patient was seen by cardiac rehab and ambulated without difficulty.  With improvement in his chest pain and resolution of his CHF symptoms, he was considered stable for discharge on March 25, 2001.  DISCHARGE CONDITION:  Improved.  DISCHARGE DIAGNOSES: 1. Acute inferior myocardial infarction secondary to circumflex occlusion,    stented this admission. 2. Residual coronary artery disease with a 40% mid left anterior descending    artery and a 50% mid distal circumflex. 3. Preserved left ventricular function with an ejection fraction of 55% and    some anteroapical and inferoapical hypokinesis and akinesis.  4. Hyperlipidemia. 5. History of hypertension. 6. History of a cerebrovascular accident. 7. Ongoing tobacco use. 8. History of gastroesophageal reflux disease symptoms. 9. Pulmonary edema.  DISCHARGE INSTRUCTIONS:  ACTIVITY:  His activity level is to include no driving, sexual, or strenuous activity until seen by M.D.  DIET:  He is to stick to a low salt, fat, and cholesterol  diet.  WOUND CARE:  He is to observe the catheterization site for bruising, drainage, swelling, or discomfort.  SPECIAL INSTRUCTIONS:  He is not to smoke.  FOLLOW-UP:  He has an appointment with Dr. Juanda Chance on April 12, 2002 at 9 a.m. with the P.A.  He is to see Dr. Zollie Beckers as needed.  DISCHARGE MEDICATIONS: 1. Plavix 75 mg q.d. 2. Coated aspirin 325 mg q.d. 3. Zocor 40 mg q.d. 4. Prevacid as prior to admission. 5. Nitroglycerin sublingual p.r.n. 6. He is not to take Norvasc or amlodipine for now. Dictated by:   Lavella Hammock, P.A. Attending Physician:  Lenoria Farrier DD:  05/06/01 TD:  05/06/01 Job: [id number]

## 2010-09-02 NOTE — Assessment & Plan Note (Signed)
[facility]                              CARDIOLOGY OFFICE NOTE   [name], [name]                       MRN:          [id number]  DATE:01/29/2006                            DOB:          Oct 03, 1945    PRIMARY CARE PHYSICIAN:  Dr. Alphonsus Sias.   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  He  has his grandson working with him now.   In December of 2002 he had a diaphragmatic wall infarction treated with a  bare metal stent to the circumflex artery.  He has good LV function.   He has done quite well since that time.  He has had no recent chest pain,  shortness of breath, or palpitations.   He is still smoking 1 pack of cigarettes a day, but has cut back form a  previous level of 3 packs per day.   PAST MEDICAL HISTORY:  Significant for GERD, hyperlipidemia, and  hypertension.   CURRENT MEDICATIONS:  Include Plavix, aspirin, Nexium, Vytorin, Lotensin,  and Toprol.   EXAMINATION:  Blood pressure is 150/89, the pulse 77 and regular.  There is no venous distension.  Carotid pulses were full without bruits.  CHEST:  Clear.  CARDIAC:  Rhythm was regular.  No murmurs or gallops.  ABDOMEN:  Soft with normal bowel sounds.  There was no hepatosplenomegaly.  Peripheral pulses were full.  There was no peripheral edema.   ELECTROCARDIOGRAM:  Increased voltage and nonspecific ST-T changes.   Recent laboratory work done at Dr. Karle Starch office showed a cholesterol of  124, an HDL of 30, and an LDL of 77.  His SGPT and SGOT was normal.   IMPRESSION:  1. Coronary artery disease status post posterior wall myocardial      infarction treated with stenting of circumflex artery in 2002 with a      bare metal stent, now stable.  2. Good left ventricular function.  3. Continued cigarette  use.  4. Hyperlipidemia.  5. Hypertension.   RECOMMENDATIONS:  I think [name]'s cardiac situation is stable.  His  risk factor control is not optimal in that his blood pressure is a little  bit elevated and he is still smoking.  This is an isolated blood pressure  reading, so I asked him to get a repeat check, which he does with his wife,  who is diabetic, and let us know if he is over 140/80 more than  occasionally.  His HDL is not at target, but we will plan to continue his  current medications.  I encouraged him to work on the cigarettes.  We will  see him back in followup in 1 year.            ______________________________  Everardo Beals. Juanda Chance, MD, [facility]     BRB/MedQ  DD:  01/29/2006  DT:  01/30/2006  Job #:  [id number]

## 2010-09-02 NOTE — Cardiovascular Report (Signed)
[place]. [facility]  Patient:    [name], [name] Visit Number: [id number] MRN: [id number]          Service Type: MED Location: CCUA 2922 01 Attending Physician:  Lenoria Farrier Dictated by:   Everardo Beals Juanda Chance, M.D. [facility] Proc. Date: 03/21/01 Admit Date:  03/21/2001   CC:         Dr. Randa Lynn, [facility], [place]. Dietrich Pates, M.D. [facility]  Cardiopulmonary Laboratory   Cardiac Catheterization  PROCEDURES PERFORMED: Cardiac catheterization.  CLINICAL HISTORY: The patient is 65 years old and has no prior history of known heart disease, although he is hypertensive and has hyperlipidemia and smokes and has a family history for coronary heart disease. He developed chest pain yesterday, but he developed recurrent chest pain today at 4 p.m. and went to [facility] Emergency Room at 1830 hours where his ECG showed an inferior posterior infarction. The helicopter transport to [facility] was not available and decision was made to transfer him to [facility] by [facility].  DESCRIPTION OF PROCEDURE: The procedure was performed via the right femoral artery using an arterial sheath and 6 French preformed coronary catheters.  A front wall arterial puncture was performed and Omnipaque contrast was used. At the completion of the diagnostic study, we made a decision to proceed with intervention on the circumflex artery.  The patient had been given heparin earlier and was given 3 chewable aspirin and had been started on Integrilin at [facility]. We gave additional heparin to prolong the ACT greater than 200 seconds. We used a 4.0, 7 [place] guiding catheter with side holes and short luge wire. We were unable to cross the lesion with a luge wire and switched to a PT Graphix and finally crossed the lesion with some difficulty. We dilated with a 3.0 x 20 mm Maverick balloon and performed 3 inflations up to 7 atmospheres for 40 seconds. With reperfusion, the  patient developed marked bradycardia and hypotension requiring IV fluids, dopamine and a temporary transvenous pacemaker. After about 15 minutes we stabilized his condition and proceeded with the intervention. We then used a 3.5 x 20 mm Express stent and deployed this across a marginal branch which was located before the total occlusion and deployed this with one inflation of 15 atmospheres for 45 seconds. The distal edge of the stent appeared to be in an ectatic segment of the vessel so we went back in with a 3.75 x 12 mm Quantum Maverick and performed 2 inflations of 12 and 14 atmospheres for 32 and 33 seconds. Repeat diagnostic studies were then performed through the guiding catheter. The patient tolerated the procedure well and left the laboratory in satisfactory condition.  RESULTS: The aortic pressure was 91/61 with a mean of 74. Left ventricular pressure was 91/29.  The left main coronary artery: The left main coronary artery was free of significant disease.  Left anterior descending: The left anterior descending artery gave rise to 5 septal perforators and a large diagonal branch. There was 40% segmental narrowing in the proximal portion of the vessel.  Circumflex artery: The circumflex artery gave rise to a marginal branch and then was completely occluded proximally. There were no collaterals seen.  Right coronary artery: The right coronary was a small vessel that gave rise to a conus branch, a right ventricular branch and a very small posterior descending branch. Most of the inferior wall was supplied by the circumflex.  LEFT VENTRICULOGRAM: The left ventriculogram performed in the RAO  projection showed akinesis of the inferobasal segment and hypokinesis of the mid inferior segment. The rest of the wall motion was good and the estimated ejection fraction was 55%.  LEFT VENTRICULOGRAM: The left ventriculogram performed in the LAO projection showed hypokinesis of the  inferolateral wall.  Following PTCA and stenting of the circumflex lesion, the stenosis improved from 100% to less than 10% and the flow improved from TIMI-0 to TIMI-3 flow. The distal vessel consisted of a second marginal branch, a posterolateral and a posterior descending branch which appeared to be free of major obstruction. There was a good myocardial blush after reperfusion.  The patient had the onset of chest pain at 1600 hours and arrived at [facility] Emergency Room at 1820 hours. The first balloon inflation was performed at 2247 hours. This gave a door to balloon time of 4 hours and 17 minutes and a reperfusion time of 6 hours and 47 minutes.  IMPRESSION: 1. Acute inferior wall myocardial infarction with total occlusion of the    proximal circumflex artery, 40% narrowing in the proximal left anterior    descending artery, no major obstruction in the right coronary artery and    inferior wall hypo - akinesis. 2. Successful reperfusion and stenting of the circumflex artery with    improvement in percent diameter narrowing from 100% to less than 10%    and improvement in the flow from TIMI-0 to TIMI-3 flow.  DISPOSITION: The patient was returned to the postangioplasty unit for further observation. The patient will be classified as low risk and will target discharge on day #3, which will be Sunday. Dictated by:   Everardo Beals Juanda Chance, M.D. [facility] Attending Physician:  Lenoria Farrier DD:  03/21/01 TD:  03/22/01 Job: [id number]

## 2010-11-01 ENCOUNTER — Other Ambulatory Visit: Payer: Self-pay | Admitting: Neurosurgery

## 2010-11-01 DIAGNOSIS — M549 Dorsalgia, unspecified: Secondary | ICD-10-CM

## 2010-11-14 ENCOUNTER — Ambulatory Visit
Admission: RE | Admit: 2010-11-14 | Discharge: 2010-11-14 | Disposition: A | Discharge status: Home / Self Care | Payer: No Typology Code available for payment source | Source: Ambulatory Visit | Attending: Neurosurgery | Admitting: Neurosurgery

## 2010-11-14 DIAGNOSIS — M549 Dorsalgia, unspecified: Secondary | ICD-10-CM

## 2010-11-18 ENCOUNTER — Other Ambulatory Visit: Payer: Self-pay | Admitting: Neurosurgery

## 2010-11-18 DIAGNOSIS — M47816 Spondylosis without myelopathy or radiculopathy, lumbar region: Secondary | ICD-10-CM

## 2010-11-24 ENCOUNTER — Encounter: Payer: Self-pay | Admitting: Internal Medicine

## 2010-11-28 ENCOUNTER — Ambulatory Visit
Admission: RE | Admit: 2010-11-28 | Discharge: 2010-11-28 | Disposition: A | Discharge status: Home / Self Care | Payer: Medicare Other | Source: Ambulatory Visit | Attending: Neurosurgery | Admitting: Neurosurgery

## 2010-11-28 DIAGNOSIS — M47816 Spondylosis without myelopathy or radiculopathy, lumbar region: Secondary | ICD-10-CM

## 2010-11-28 MED ORDER — METHYLPREDNISOLONE ACETATE 40 MG/ML INJ SUSP (RADIOLOG
120.0000 mg | Freq: Once | INTRAMUSCULAR | Status: AC
  Administered 2010-11-28: 120 mg via EPIDURAL

## 2010-11-28 MED ORDER — IOHEXOL 180 MG/ML  SOLN
1.0000 mL | Freq: Once | INTRAMUSCULAR | Status: AC | PRN
  Administered 2010-11-28: 1 mL via EPIDURAL

## 2010-12-08 ENCOUNTER — Other Ambulatory Visit: Payer: Self-pay | Admitting: Internal Medicine

## 2010-12-09 ENCOUNTER — Other Ambulatory Visit: Payer: Self-pay | Admitting: Internal Medicine

## 2010-12-09 MED ORDER — TIOTROPIUM BROMIDE MONOHYDRATE 18 MCG IN CAPS: 18.0000 ug | ORAL_CAPSULE | Freq: Every day | RESPIRATORY_TRACT | Status: DC

## 2010-12-09 MED ORDER — FLUTICASONE-SALMETEROL 250-50 MCG/DOSE IN AEPB: 1.0000 | INHALATION_SPRAY | Freq: Two times a day (BID) | RESPIRATORY_TRACT | Status: DC

## 2010-12-09 NOTE — Telephone Encounter (Signed)
He can resume the Spiriva one capsule inhlaed daily.  The Advair or Symbicort are similar and if there is a singificant cost difference to patient,  Choose the cheapr one,  The pharmacy may be able to tell us which one .  symbicort is 2 puffs twice daily  #11 refills.  Advair is 1 puff twice daily  11 refills

## 2010-12-09 NOTE — Telephone Encounter (Signed)
Clyde from [employer] called and wanted to know what inhalers you wanted patient on.  He stated you gave him Symbicort 80/4.5 on 04/18/10, and Advair 250/50 on 06/02/08.  He wanted to know which one you wanted him to have.  Patient also needs Spiriva refilled but hasn't had it refilled since 03/17/08.  Please advise

## 2010-12-15 ENCOUNTER — Ambulatory Visit (INDEPENDENT_AMBULATORY_CARE_PROVIDER_SITE_OTHER): Payer: Medicare Other | Admitting: Internal Medicine

## 2010-12-15 ENCOUNTER — Encounter: Payer: Self-pay | Admitting: Internal Medicine

## 2010-12-15 VITALS — BP 115/75 | HR 81 | Temp 97.6°F | Resp 14 | Ht 72.0 in | Wt 213.0 lb

## 2010-12-15 DIAGNOSIS — B370 Candidal stomatitis: Secondary | ICD-10-CM

## 2010-12-15 DIAGNOSIS — Z79899 Other long term (current) drug therapy: Secondary | ICD-10-CM

## 2010-12-15 DIAGNOSIS — R609 Edema, unspecified: Secondary | ICD-10-CM

## 2010-12-15 DIAGNOSIS — J449 Chronic obstructive pulmonary disease, unspecified: Secondary | ICD-10-CM

## 2010-12-15 DIAGNOSIS — R600 Localized edema: Secondary | ICD-10-CM

## 2010-12-15 LAB — BASIC METABOLIC PANEL
BUN: 10 mg/dL (ref 6–23)
Chloride: 107 mEq/L (ref 96–112)
Glucose, Bld: 109 mg/dL — ABNORMAL HIGH (ref 70–99)
Potassium: 3.8 mEq/L (ref 3.5–5.1)

## 2010-12-15 MED ORDER — BUDESONIDE-FORMOTEROL FUMARATE 160-4.5 MCG/ACT IN AERO: 2.0000 | INHALATION_SPRAY | Freq: Two times a day (BID) | RESPIRATORY_TRACT | Status: DC

## 2010-12-15 MED ORDER — FLUCONAZOLE 100 MG PO TABS: 150.0000 mg | ORAL_TABLET | Freq: Every day | ORAL | Status: AC

## 2010-12-15 MED ORDER — NYSTATIN 100000 UNIT/ML MT SUSP: 500000.0000 [IU] | Freq: Four times a day (QID) | OROMUCOSAL | Status: AC

## 2010-12-15 MED ORDER — POTASSIUM CHLORIDE ER 10 MEQ PO TBCR: 20.0000 meq | EXTENDED_RELEASE_TABLET | Freq: Every day | ORAL | Status: DC

## 2010-12-15 MED ORDER — FUROSEMIDE 40 MG PO TABS: 120.0000 mg | ORAL_TABLET | Freq: Every day | ORAL | Status: DC

## 2010-12-15 NOTE — Progress Notes (Signed)
  Subjective:    Patient ID: [name], male    DOB: [birth year], 65 y.o.   MRN: [id number]  HPI 65 yo male with history of COPD, CAD, and degenerative disk disease presents with mouth pain adn odynophagia.  Symptoms started a few days ago after receiving two epidural steroid injections.  He denies fevers, headaches, and dysphagia.     Review of Systems  Constitutional: Negative for fever, chills, diaphoresis, activity change, appetite change, fatigue and unexpected weight change.  HENT: Positive for congestion. Negative for hearing loss, ear pain, nosebleeds, sore throat, facial swelling, rhinorrhea, sneezing, drooling, mouth sores, trouble swallowing, neck pain, neck stiffness, dental problem, voice change, postnasal drip, sinus pressure, tinnitus and ear discharge.   Eyes: Negative for photophobia, pain, discharge, redness, itching and visual disturbance.  Respiratory: Negative for apnea, cough, choking, chest tightness, shortness of breath, wheezing and stridor.   Cardiovascular: Positive for leg swelling. Negative for chest pain and palpitations.  Gastrointestinal: Negative for nausea, vomiting, abdominal pain, diarrhea, constipation, blood in stool, abdominal distention, anal bleeding and rectal pain.  Genitourinary: Negative for dysuria, urgency, frequency, hematuria, flank pain, decreased urine volume, scrotal swelling, difficulty urinating and testicular pain.  Musculoskeletal: Positive for back pain. Negative for myalgias, joint swelling, arthralgias and gait problem.  Skin: Negative for color change, rash and wound.  Neurological: Negative for dizziness, tremors, seizures, syncope, speech difficulty, weakness, light-headedness, numbness and headaches.  Psychiatric/Behavioral: Negative for suicidal ideas, hallucinations, behavioral problems, confusion, sleep disturbance, dysphoric mood, decreased concentration and agitation. The patient is not nervous/anxious.        Objective:   Physical Exam  Constitutional: He appears well-developed and well-nourished.  HENT:  Head: Normocephalic and atraumatic.  Mouth/Throat: Oral lesions present.         thrush   Eyes: EOM are normal. Pupils are equal, round, and reactive to light.  Neck: Normal range of motion. Neck supple. No JVD present. No tracheal deviation present. No thyromegaly present.  Lymphadenopathy:    He has cervical adenopathy.          Assessment & Plan:  Oral thrush: secondary to inhaled and systemic steorids.  Will treat with one dose of fluconazole and 5 days of nystatin oral solution.   Edema:  Secondary to Lyrica: Increase furosemide to 40 mg daily.  Back pain: secondaty to new herniated disk per patient.  Receiving treatment by Trey Sailors.

## 2010-12-15 NOTE — Patient Instructions (Signed)
Take the dose of fluconazole today and start the nystatin oral rinse today as well.  You willl need to swish it around your mouth and then swallow it four times daily until the bottle is empty .  Take the 40 mg of furosemide daily until the swelling resolves.  I will call you about the potassium.  Use the symbicort twice daily (2 puffs each time) every day.

## 2010-12-29 ENCOUNTER — Ambulatory Visit (INDEPENDENT_AMBULATORY_CARE_PROVIDER_SITE_OTHER): Payer: Medicare Other | Admitting: Cardiovascular Disease

## 2010-12-29 ENCOUNTER — Encounter: Payer: Self-pay | Admitting: *Deleted

## 2010-12-29 ENCOUNTER — Encounter: Payer: Self-pay | Admitting: Cardiovascular Disease

## 2010-12-29 VITALS — BP 110/67 | HR 78 | Ht 72.0 in | Wt 206.0 lb

## 2010-12-29 DIAGNOSIS — R9431 Abnormal electrocardiogram [ECG] [EKG]: Secondary | ICD-10-CM

## 2010-12-29 DIAGNOSIS — Z01810 Encounter for preprocedural cardiovascular examination: Secondary | ICD-10-CM

## 2010-12-29 DIAGNOSIS — I10 Essential (primary) hypertension: Secondary | ICD-10-CM

## 2010-12-29 DIAGNOSIS — I2510 Atherosclerotic heart disease of native coronary artery without angina pectoris: Secondary | ICD-10-CM

## 2010-12-29 DIAGNOSIS — E785 Hyperlipidemia, unspecified: Secondary | ICD-10-CM

## 2010-12-29 MED ORDER — METOPROLOL TARTRATE 25 MG PO TABS: 25.0000 mg | ORAL_TABLET | Freq: Two times a day (BID) | ORAL | Status: DC | PRN

## 2010-12-29 NOTE — Assessment & Plan Note (Signed)
He is relatively asymptomatic in terms of his breathing and chest pain. He is relatively sedentary though at baseline. What is most concerning, is that he has new diffuse T-wave abnormality on his EKG. Given his sedentary last time you EKG changes, we will schedule him for a lexiscan Myoview to be done in the next day or so. We hope to provide better risk assessment given his pending surgery in 10 days time.  We have suggested if his test shows no significant ischemia, he hold his aspirin 10 days prior to the surgery or as long as Dr. Channing Mutters would prefer. We will also give him metoprolol tartrate 12.5 mg the day before and morning of the surgery. We will recommend limitations to the IV fluids use in an effort to avoid perioperative arrhythmia.

## 2010-12-29 NOTE — Assessment & Plan Note (Signed)
Blood pressure is borderline low. We will follow him closely. Ideally we would like to continue low dose beta blocker following the surgery.

## 2010-12-29 NOTE — Assessment & Plan Note (Signed)
Goal LDL less than 70. We have suggested he continue simvastatin.

## 2010-12-29 NOTE — Patient Instructions (Addendum)
You are doing well. No medication changes were made. We will schedule a stress test as your ekg has changed Please take metoprolol 1/2 pill the night before and morning of the surgery. Please call us if you have new issues that need to be addressed before your next appt.  We will call you for a follow up Appt. In 12 months

## 2010-12-29 NOTE — Assessment & Plan Note (Signed)
Stress test planned as above. When surgery is complete, we'll restart aspirin 81 mg x2 with his statin.

## 2010-12-29 NOTE — Progress Notes (Signed)
Patient ID: [name], male    DOB: [birth year], 65 y.o.   MRN: [id number]  HPI Comments: The patient is 65 years old with CAD,  posterior MI in 2002 treated with a bare-metal stent to the circumflex artery,  back surgery by Dr. Noel Gerold x2 In 2009 and 2010, and he had a negative Myoview scan at that time with an ejection fraction of 60% (2010).He had another preop evaluation in February 2011 prior to prostate biopsy and he ended up having a seed implantation. He's been off Plavix since that time.   He reports that he is scheduled for back surgery on September 24 with Dr. Channing Mutters. He did have problems with lower extremity edema and shortness of breath though this improved after he stopped lyrica. He reports that his breathing is stable though he continues to smoke 5-6 cigarettes per day. He denies any significant chest pain is not very active at baseline secondary to his hip and radiating leg pain with exertion.  He is scheduled for L3-L4 fusion . Otherwise he has no complaints   EKG today shows sinus rhythm with rate 82 beats per minute with diffuse T-wave abnormality in V2 through V6, one, aVL, 2, 3, aVF This is a new T wave abnormality that was not seen on his EKG at the end of 2011      Outpatient Encounter Prescriptions as of 12/29/2010  Medication Sig Dispense Refill  . aspirin 81 MG tablet Take 81 mg by mouth daily.        . budesonide-formoterol (SYMBICORT) 160-4.5 MCG/ACT inhaler Inhale 2 puffs into the lungs 2 (two) times daily.  1 Inhaler  12  . Fluticasone-Salmeterol (ADVAIR DISKUS) 250-50 MCG/DOSE AEPB Inhale 1 puff into the lungs 2 (two) times daily.  14 each  11  . omeprazole (PRILOSEC) 40 MG capsule Take 40 mg by mouth daily.        . phenazopyridine (PYRIDIUM) 200 MG tablet Take 200 mg by mouth 3 (three) times daily as needed.        . simvastatin (ZOCOR) 80 MG tablet Take 80 mg by mouth at bedtime.        . Tamsulosin HCl (FLOMAX) 0.4 MG CAPS Take 0.4 mg by mouth daily.          [name] tiotropium (SPIRIVA HANDIHALER) 18 MCG inhalation capsule Place 1 capsule (18 mcg total) into inhaler and inhale daily.  30 capsule  2  . traMADol (ULTRAM) 50 MG tablet Take 50 mg by mouth 4 (four) times daily as needed.        [name] DISCONTD: methocarbamol (ROBAXIN) 500 MG tablet Take 500 mg by mouth 2 (two) times daily.        . metoprolol tartrate (LOPRESSOR) 25 MG tablet Take 1 tablet (25 mg total) by mouth 2 (two) times daily as needed.  60 tablet  11  . DISCONTD: furosemide (LASIX) 40 MG tablet Take 40 mg by mouth daily.        [name] DISCONTD: nabumetone (RELAFEN) 500 MG tablet Take 500 mg by mouth every 6 (six) hours as needed.        [name] DISCONTD: potassium chloride (K-DUR) 10 MEQ tablet Take 2 tablets (20 mEq total) by mouth daily.  30 tablet  3     Review of Systems  Constitutional: Negative.   HENT: Negative.   Eyes: Negative.   Respiratory: Positive for shortness of breath.   Cardiovascular: Negative.   Gastrointestinal: Negative.   Musculoskeletal: Positive for back  pain.  Skin: Negative.   Neurological: Negative.   Hematological: Negative.   Psychiatric/Behavioral: Negative.   All other systems reviewed and are negative.    BP 110/67  Pulse 78  Ht 6' (1.829 m)  Wt 206 lb (93.441 kg)  BMI 27.94 kg/m2   Physical Exam  Nursing note and vitals reviewed. Constitutional: He is oriented to person, place, and time. He appears well-developed and well-nourished.  HENT:  Head: Normocephalic.  Nose: Nose normal.  Mouth/Throat: Oropharynx is clear and moist.  Eyes: Conjunctivae are normal. Pupils are equal, round, and reactive to light.  Neck: Normal range of motion. Neck supple. No JVD present.  Cardiovascular: Normal rate, regular rhythm, S1 normal, S2 normal, normal heart sounds and intact distal pulses.  Exam reveals no gallop and no friction rub.   No murmur heard. Pulmonary/Chest: Effort normal and breath sounds normal. No respiratory distress. He has no wheezes. He has  no rales. He exhibits no tenderness.  Abdominal: Soft. Bowel sounds are normal. He exhibits no distension. There is no tenderness.  Musculoskeletal: Normal range of motion. He exhibits no edema and no tenderness.  Lymphadenopathy:    He has no cervical adenopathy.  Neurological: He is alert and oriented to person, place, and time. Coordination normal.  Skin: Skin is warm and dry. No rash noted. No erythema.  Psychiatric: He has a normal mood and affect. His behavior is normal. Judgment and thought content normal.           Assessment and Plan

## 2011-01-02 ENCOUNTER — Ambulatory Visit: Payer: Medicare Other | Admitting: Cardiovascular Disease

## 2011-01-02 DIAGNOSIS — R9431 Abnormal electrocardiogram [ECG] [EKG]: Secondary | ICD-10-CM

## 2011-01-03 ENCOUNTER — Ambulatory Visit (INDEPENDENT_AMBULATORY_CARE_PROVIDER_SITE_OTHER): Payer: Medicare Other | Admitting: *Deleted

## 2011-01-03 ENCOUNTER — Telehealth: Payer: Self-pay | Admitting: *Deleted

## 2011-01-03 DIAGNOSIS — Z01810 Encounter for preprocedural cardiovascular examination: Secondary | ICD-10-CM

## 2011-01-03 DIAGNOSIS — R9439 Abnormal result of other cardiovascular function study: Secondary | ICD-10-CM

## 2011-01-03 DIAGNOSIS — I2510 Atherosclerotic heart disease of native coronary artery without angina pectoris: Secondary | ICD-10-CM

## 2011-01-03 NOTE — Telephone Encounter (Signed)
Per Dr. Mariah Milling, pt will have [facility] at [facility] this thurs 9/20 for positive stress test/ pre-op, pt notified. He will come in today for labs/instructions.

## 2011-01-03 NOTE — Telephone Encounter (Signed)
Yes, would take at least a 1/2 pill in Am and PM and monitor heart rate and BP Cardioprotective!

## 2011-01-03 NOTE — Telephone Encounter (Signed)
What does pt need to do regarding metoprolol? He was going to take prior to back surgery only, now that that is moved back, does he need to start at any point? Please advise.

## 2011-01-04 LAB — CBC WITH DIFFERENTIAL/PLATELET
Basophils Absolute: 0 10*3/uL (ref 0.0–0.1)
Eosinophils Relative: 5 % (ref 0–5)
HCT: 42.6 % (ref 39.0–52.0)
Hemoglobin: 14 g/dL (ref 13.0–17.0)
Lymphocytes Relative: 22 % (ref 12–46)
MCHC: 32.9 g/dL (ref 30.0–36.0)
MCV: 95.5 fL (ref 78.0–100.0)
Monocytes Absolute: 0.6 10*3/uL (ref 0.1–1.0)
Monocytes Relative: 7 % (ref 3–12)
RDW: 12.3 % (ref 11.5–15.5)

## 2011-01-04 LAB — BASIC METABOLIC PANEL
CO2: 25 mEq/L (ref 19–32)
Glucose, Bld: 88 mg/dL (ref 70–99)
Potassium: 4.2 mEq/L (ref 3.5–5.3)
Sodium: 140 mEq/L (ref 135–145)

## 2011-01-05 ENCOUNTER — Encounter: Payer: Self-pay | Admitting: Cardiovascular Disease

## 2011-01-05 ENCOUNTER — Ambulatory Visit: Payer: Medicare Other | Admitting: Cardiovascular Disease

## 2011-01-05 DIAGNOSIS — R9430 Abnormal result of cardiovascular function study, unspecified: Secondary | ICD-10-CM

## 2011-01-05 DIAGNOSIS — I2510 Atherosclerotic heart disease of native coronary artery without angina pectoris: Secondary | ICD-10-CM

## 2011-01-05 DIAGNOSIS — I739 Peripheral vascular disease, unspecified: Secondary | ICD-10-CM

## 2011-01-05 NOTE — Telephone Encounter (Signed)
Pt was notified to do this on 01/04/11

## 2011-01-06 ENCOUNTER — Telehealth: Payer: Self-pay | Admitting: *Deleted

## 2011-01-06 MED ORDER — LISINOPRIL 10 MG PO TABS: 10.0000 mg | ORAL_TABLET | Freq: Every day | ORAL | Status: DC

## 2011-01-06 MED ORDER — ASPIRIN 325 MG PO TABS: 325.0000 mg | ORAL_TABLET | Freq: Every day | ORAL | Status: DC

## 2011-01-06 MED ORDER — CLOPIDOGREL BISULFATE 75 MG PO TABS: 75.0000 mg | ORAL_TABLET | Freq: Every day | ORAL | Status: AC

## 2011-01-06 MED ORDER — CARVEDILOL 12.5 MG PO TABS: 12.5000 mg | ORAL_TABLET | Freq: Two times a day (BID) | ORAL | Status: DC

## 2011-01-06 NOTE — Telephone Encounter (Signed)
Per Dr. Mariah Milling, will call in new Rx for pt post discharge.

## 2011-01-09 ENCOUNTER — Telehealth: Payer: Self-pay | Admitting: *Deleted

## 2011-01-09 NOTE — Telephone Encounter (Signed)
Pt's wife called this am stating pt was changed from metoprolol to coreg upon discharge from hospital and wanted to make sure ok with Dr. Mariah Milling. Notified this is fine per TG, as long as he is on beta blocker. Also, pt having cramping in legs, however, this has been going on for some time. He has been on simvastatin 80mg  for long time as well. Advised pt to hold for about 2 days and may take half dose after that, and call me at the end of week. If symptoms improved pt can take half dose per Dr. Mariah Milling, otherwise, will need to f/u with PCP. Pt and wife ok with this.

## 2011-01-12 ENCOUNTER — Telehealth: Payer: Self-pay | Admitting: *Deleted

## 2011-01-12 NOTE — Telephone Encounter (Signed)
Called pt to f/u with symptoms of cramping after holding simvastatin. He says leg cramping is gone. Advised he try taking 1/2 tab (40mg ) throughout weekend, if returns, hold and let me know on Monday. Otherwise may be ok on 40mg . Please advise otherwise.

## 2011-01-16 ENCOUNTER — Encounter: Payer: Self-pay | Admitting: Cardiovascular Disease

## 2011-01-19 ENCOUNTER — Ambulatory Visit: Payer: Medicare Other | Admitting: Cardiovascular Disease

## 2011-01-19 ENCOUNTER — Ambulatory Visit (INDEPENDENT_AMBULATORY_CARE_PROVIDER_SITE_OTHER): Payer: Medicare Other | Admitting: Cardiovascular Disease

## 2011-01-19 ENCOUNTER — Encounter: Payer: Self-pay | Admitting: Cardiovascular Disease

## 2011-01-19 DIAGNOSIS — Z01810 Encounter for preprocedural cardiovascular examination: Secondary | ICD-10-CM

## 2011-01-19 DIAGNOSIS — I2510 Atherosclerotic heart disease of native coronary artery without angina pectoris: Secondary | ICD-10-CM

## 2011-01-19 DIAGNOSIS — E785 Hyperlipidemia, unspecified: Secondary | ICD-10-CM

## 2011-01-19 DIAGNOSIS — I10 Essential (primary) hypertension: Secondary | ICD-10-CM

## 2011-01-19 NOTE — Assessment & Plan Note (Signed)
He would be cleared for surgery in several months time after he has been on aspirin and Plavix for at least one month, preferably 2 months. He would be an acceptable risk. I would suggest minimal IV fluids given he continues to have mild lower extremity edema.

## 2011-01-19 NOTE — Patient Instructions (Signed)
You are doing well. Please cut the lisinopril in 1/2 (5 mg) daily Please call us if you have new issues that need to be addressed before your next appt.  We will call you for a follow up Appt. In 6 months

## 2011-01-19 NOTE — Assessment & Plan Note (Signed)
Blood pressure is low today. We will decrease his lisinopril in half to 5 mg daily. We would like to keep him on low dose ACE inhibitor and beta blocker.

## 2011-01-19 NOTE — Progress Notes (Signed)
Patient ID: [name], male    DOB: 1945/11/22, 65 y.o.   MRN: [id number]  HPI Comments: The patient is 65 years old with CAD,  posterior MI in 2002 treated with a bare-metal stent to the circumflex artery,  back surgery by Dr. Noel Gerold x2 In 2009 and 2010, and he had a negative Myoview scan at that time with an ejection fraction of 60% (2010).He had another preop evaluation in February 2011 prior to prostate biopsy and he ended up having a seed implantation. He's been off Plavix since that time.   On his last clinic visit, he had abnormal EKG noted. He had a stress test that showed anterior wall ischemia. Cardiac catheterization was performed on January 05 2011 that showed 95% proximal LAD disease. A bare metal stent was placed ( 3.5 x 18 mm vision bare metal stent). Since then, his breathing has been much better. He is able to walk to the mailbox without having to stop we'll take a breathing treatment. He continues to have severe back pain like to have back surgery as soon as possible.  He continues to smoke. Otherwise he has no complaints   EKG today shows sinus rhythm with rate 82 beats per minute with diffuse T-wave abnormality in V2 through V6, one, aVL, 2, 3, aVF       Outpatient Encounter Prescriptions as of 01/19/2011  Medication Sig Dispense Refill  . aspirin 325 MG tablet Take 1 tablet (325 mg total) by mouth daily.  30 tablet  0  . budesonide-formoterol (SYMBICORT) 160-4.5 MCG/ACT inhaler Inhale 2 puffs into the lungs 2 (two) times daily.  1 Inhaler  12  . carvedilol (COREG) 12.5 MG tablet Take 1 tablet (12.5 mg total) by mouth 2 (two) times daily.  30 tablet  6  . clopidogrel (PLAVIX) 75 MG tablet Take 1 tablet (75 mg total) by mouth daily.  30 tablet  11  . Fluticasone-Salmeterol (ADVAIR DISKUS) 250-50 MCG/DOSE AEPB Inhale 1 puff into the lungs 2 (two) times daily.  14 each  11  . furosemide (LASIX) 40 MG tablet Take 40 mg by mouth daily.        [name] lisinopril  (PRINIVIL,ZESTRIL) 10 MG tablet Take 1 tablet (10 mg total) by mouth daily.  30 tablet  11  . methocarbamol (ROBAXIN) 500 MG tablet Take 500 mg by mouth 2 (two) times daily.        [name] omeprazole (PRILOSEC) 40 MG capsule Take 40 mg by mouth daily.        . phenazopyridine (PYRIDIUM) 200 MG tablet Take 200 mg by mouth 3 (three) times daily as needed.        . potassium chloride SA (K-DUR,KLOR-CON) 20 MEQ tablet Take 20 mEq by mouth daily.        . simvastatin (ZOCOR) 80 MG tablet Take 40 mg by mouth at bedtime.       . Tamsulosin HCl (FLOMAX) 0.4 MG CAPS Take 0.4 mg by mouth daily.        [name] tiotropium (SPIRIVA HANDIHALER) 18 MCG inhalation capsule Place 1 capsule (18 mcg total) into inhaler and inhale daily.  30 capsule  2  . traMADol (ULTRAM) 50 MG tablet Take 50 mg by mouth 4 (four) times daily as needed.           Review of Systems  Constitutional: Negative.   HENT: Negative.   Eyes: Negative.   Cardiovascular: Negative.   Gastrointestinal: Negative.   Musculoskeletal: Positive for back pain.  Skin: Negative.   Neurological: Negative.   Hematological: Negative.   Psychiatric/Behavioral: Negative.   All other systems reviewed and are negative.    BP 93/59  Ht 6' (1.829 m)  Wt 206 lb (93.441 kg)  BMI 27.94 kg/m2   Physical Exam  Nursing note and vitals reviewed. Constitutional: He is oriented to person, place, and time. He appears well-developed and well-nourished.  HENT:  Head: Normocephalic.  Nose: Nose normal.  Mouth/Throat: Oropharynx is clear and moist.  Eyes: Conjunctivae are normal. Pupils are equal, round, and reactive to light.  Neck: Normal range of motion. Neck supple. No JVD present.  Cardiovascular: Normal rate, regular rhythm, S1 normal, S2 normal, normal heart sounds and intact distal pulses.  Exam reveals no gallop and no friction rub.   No murmur heard. Pulmonary/Chest: Effort normal. No respiratory distress. He has decreased breath sounds. He has no  wheezes. He has no rales. He exhibits no tenderness.  Abdominal: Soft. Bowel sounds are normal. He exhibits no distension. There is no tenderness.  Musculoskeletal: Normal range of motion. He exhibits edema. He exhibits no tenderness.  Lymphadenopathy:    He has no cervical adenopathy.  Neurological: He is alert and oriented to person, place, and time. Coordination normal.  Skin: Skin is warm and dry. No rash noted. No erythema.  Psychiatric: He has a normal mood and affect. His behavior is normal. Judgment and thought content normal.           Assessment and Plan

## 2011-01-19 NOTE — Assessment & Plan Note (Signed)
Recent bare metal stent placed on January 05 2011  To the proximal LAD. We have suggested he give [place] as much time on aspirin and Plavix as possible before stopping the Plavix for back surgery. He will talk with Dr. Channing Mutters to see when the surgery can be scheduled. Ideally, late November or early December with the ideal.

## 2011-01-19 NOTE — Assessment & Plan Note (Signed)
Simvastatin was decreased from 80 mg to 40 mg. We have suggested he have his cholesterol checked. Goal LDL less than 70.

## 2011-01-20 ENCOUNTER — Telehealth: Payer: Self-pay | Admitting: Internal Medicine

## 2011-01-20 DIAGNOSIS — Z79899 Other long term (current) drug therapy: Secondary | ICD-10-CM

## 2011-01-20 DIAGNOSIS — E785 Hyperlipidemia, unspecified: Secondary | ICD-10-CM

## 2011-01-20 NOTE — Telephone Encounter (Signed)
Pt wife called pt went to dr Mariah Milling lb card dr Mariah Milling  wanted to know when his last chol .  If it has been over 3 month please sent up lab so this can be done

## 2011-01-25 NOTE — Telephone Encounter (Signed)
See below Pt wife called back and said dr Mariah Milling wants to get this lab done can you order this for him

## 2011-01-30 NOTE — Telephone Encounter (Signed)
Patient says that Dr. Mariah Milling wants him to have his cholesterol checked and is aksing if you could order this. Please advise.

## 2011-01-30 NOTE — Telephone Encounter (Signed)
Ordered. Please  call patient an d ask him to come by for fasting labs.

## 2011-01-31 ENCOUNTER — Encounter: Payer: Self-pay | Admitting: *Deleted

## 2011-01-31 NOTE — Telephone Encounter (Signed)
Pt coming in 02/01/11 for labs

## 2011-02-01 ENCOUNTER — Telehealth: Payer: Self-pay | Admitting: *Deleted

## 2011-02-01 ENCOUNTER — Other Ambulatory Visit (INDEPENDENT_AMBULATORY_CARE_PROVIDER_SITE_OTHER): Payer: Medicare Other | Admitting: *Deleted

## 2011-02-01 DIAGNOSIS — E785 Hyperlipidemia, unspecified: Secondary | ICD-10-CM

## 2011-02-01 DIAGNOSIS — Z79899 Other long term (current) drug therapy: Secondary | ICD-10-CM

## 2011-02-01 LAB — COMPREHENSIVE METABOLIC PANEL
AST: 14 U/L (ref 0–37)
Albumin: 3.8 g/dL (ref 3.5–5.2)
Alkaline Phosphatase: 82 U/L (ref 39–117)
Potassium: 4.2 mEq/L (ref 3.5–5.1)
Sodium: 139 mEq/L (ref 135–145)
Total Protein: 6.2 g/dL (ref 6.0–8.3)

## 2011-02-01 LAB — LIPID PANEL
LDL Cholesterol: 83 mg/dL (ref 0–99)
VLDL: 30.6 mg/dL (ref 0.0–40.0)

## 2011-02-01 NOTE — Telephone Encounter (Signed)
He can take robitussin DM for the cough and benadryl for the drainage.  If he cough is productive he needs to have an antibiotic  because he has COPD.  Please call him in azithromycin 500 mg one tablet daily for 7 days  #7 no refills.

## 2011-02-01 NOTE — Telephone Encounter (Signed)
Patient is having a lot of sinus drainage, has some cough. He is asking what he could take OTC that would be okay will all of his other meds.

## 2011-02-02 NOTE — Telephone Encounter (Signed)
Patient notified. He says that he does not have a productive cough so doesn't need the antibiotic.

## 2011-02-10 ENCOUNTER — Ambulatory Visit: Payer: Medicare Other | Admitting: Internal Medicine

## 2011-02-10 ENCOUNTER — Ambulatory Visit (INDEPENDENT_AMBULATORY_CARE_PROVIDER_SITE_OTHER): Payer: Medicare Other | Admitting: Internal Medicine

## 2011-02-10 ENCOUNTER — Encounter: Payer: Self-pay | Admitting: Internal Medicine

## 2011-02-10 DIAGNOSIS — Z720 Tobacco use: Secondary | ICD-10-CM

## 2011-02-10 DIAGNOSIS — Z79899 Other long term (current) drug therapy: Secondary | ICD-10-CM

## 2011-02-10 DIAGNOSIS — E785 Hyperlipidemia, unspecified: Secondary | ICD-10-CM

## 2011-02-10 DIAGNOSIS — J40 Bronchitis, not specified as acute or chronic: Secondary | ICD-10-CM

## 2011-02-10 DIAGNOSIS — F17200 Nicotine dependence, unspecified, uncomplicated: Secondary | ICD-10-CM

## 2011-02-10 DIAGNOSIS — R079 Chest pain, unspecified: Secondary | ICD-10-CM

## 2011-02-10 DIAGNOSIS — R0781 Pleurodynia: Secondary | ICD-10-CM

## 2011-02-10 DIAGNOSIS — Z716 Tobacco abuse counseling: Secondary | ICD-10-CM

## 2011-02-10 MED ORDER — AMOXICILLIN-POT CLAVULANATE 875-125 MG PO TABS: 1.0000 | ORAL_TABLET | Freq: Two times a day (BID) | ORAL | Status: AC

## 2011-02-10 MED ORDER — HYDROCODONE-ACETAMINOPHEN 5-500 MG PO TABS: 1.0000 | ORAL_TABLET | Freq: Four times a day (QID) | ORAL | Status: DC | PRN

## 2011-02-10 NOTE — Patient Instructions (Signed)
We are chaning your cholesterol medication from simvastatin 40 mg to crestor 20 mg daily.(7 weeks of samples given)  Repeat your LDL in 6 weeks   I want you to quit smoking   Chest x ray today,

## 2011-02-10 NOTE — Progress Notes (Signed)
Subjective:    Patient ID: [name], male    DOB: 09-Apr-1946, 65 y.o.   MRN: [id number]  HPI Mr. Vercher is a 65 yo white male with a  history of COPD, ongoing tobacco abuse and degenerative disk disease who was recently diagnosed with CAD s/p PTCA (found during preoperative exam).  He presents with left sided chest pain of several days duration aggravated by coughing. No fevers, chills, or purulent sputum.  His cough is not particularly worse than usual .  He has a chronic morning cough and sinus drainage attributed to chronic bronchitis and ongoing tobacco abuse.  He has been taking hydrocodone for relief of pain, which has helped somewhat but he has run out. Past Medical History  Diagnosis Date  . Coronary artery disease   . COPD (chronic obstructive pulmonary disease)   . Hypertension   . Hyperlipidemia   . History of kidney stones   . History of prostate cancer   . MI (myocardial infarction) 03/2001   Current Outpatient Prescriptions on File Prior to Visit  Medication Sig Dispense Refill  . aspirin 325 MG tablet Take 1 tablet (325 mg total) by mouth daily.  30 tablet  0  . budesonide-formoterol (SYMBICORT) 160-4.5 MCG/ACT inhaler Inhale 2 puffs into the lungs 2 (two) times daily.  1 Inhaler  12  . carvedilol (COREG) 12.5 MG tablet Take 1 tablet (12.5 mg total) by mouth 2 (two) times daily.  30 tablet  6  . clopidogrel (PLAVIX) 75 MG tablet Take 1 tablet (75 mg total) by mouth daily.  30 tablet  11  . Fluticasone-Salmeterol (ADVAIR DISKUS) 250-50 MCG/DOSE AEPB Inhale 1 puff into the lungs 2 (two) times daily.  14 each  11  . furosemide (LASIX) 40 MG tablet Take 40 mg by mouth daily.        [name] lisinopril (PRINIVIL,ZESTRIL) 10 MG tablet Take 1 tablet (10 mg total) by mouth daily.  30 tablet  11  . methocarbamol (ROBAXIN) 500 MG tablet Take 500 mg by mouth 2 (two) times daily.        [name] omeprazole (PRILOSEC) 40 MG capsule Take 40 mg by mouth daily.        . phenazopyridine (PYRIDIUM)  200 MG tablet Take 200 mg by mouth 3 (three) times daily as needed.        . potassium chloride SA (K-DUR,KLOR-CON) 20 MEQ tablet Take 20 mEq by mouth daily.        . simvastatin (ZOCOR) 80 MG tablet Take 40 mg by mouth at bedtime.       . Tamsulosin HCl (FLOMAX) 0.4 MG CAPS Take 0.4 mg by mouth daily.        [name] tiotropium (SPIRIVA HANDIHALER) 18 MCG inhalation capsule Place 1 capsule (18 mcg total) into inhaler and inhale daily.  30 capsule  2  . traMADol (ULTRAM) 50 MG tablet Take 50 mg by mouth 4 (four) times daily as needed.          Review of Systems  Constitutional: Negative for fever, chills, diaphoresis, activity change, appetite change, fatigue and unexpected weight change.  HENT: Negative for hearing loss, ear pain, nosebleeds, congestion, sore throat, facial swelling, rhinorrhea, sneezing, drooling, mouth sores, trouble swallowing, neck pain, neck stiffness, dental problem, voice change, postnasal drip, sinus pressure, tinnitus and ear discharge.   Eyes: Negative for photophobia, pain, discharge, redness, itching and visual disturbance.  Respiratory: Positive for cough. Negative for apnea, choking, chest tightness, shortness of breath, wheezing and  stridor.   Cardiovascular: Positive for chest pain. Negative for palpitations and leg swelling.  Gastrointestinal: Negative for nausea, vomiting, abdominal pain, diarrhea, constipation, blood in stool, abdominal distention, anal bleeding and rectal pain.  Genitourinary: Negative for dysuria, urgency, frequency, hematuria, flank pain, decreased urine volume, scrotal swelling, difficulty urinating and testicular pain.  Musculoskeletal: Negative for myalgias, back pain, joint swelling, arthralgias and gait problem.  Skin: Negative for color change, rash and wound.  Neurological: Negative for dizziness, tremors, seizures, syncope, speech difficulty, weakness, light-headedness, numbness and headaches.  Psychiatric/Behavioral: Negative for suicidal  ideas, hallucinations, behavioral problems, confusion, sleep disturbance, dysphoric mood, decreased concentration and agitation. The patient is not nervous/anxious.        Objective:   Physical Exam  Constitutional: He is oriented to person, place, and time.  HENT:  Head: Normocephalic and atraumatic.  Mouth/Throat: Oropharynx is clear and moist.  Eyes: Conjunctivae and EOM are normal.  Neck: Normal range of motion. Neck supple. No JVD present. No thyromegaly present.  Cardiovascular: Normal rate, regular rhythm and normal heart sounds.   Pulmonary/Chest: Effort normal and breath sounds normal. He has no wheezes. He has no rales.  Abdominal: Soft. Bowel sounds are normal. He exhibits no mass. There is no tenderness. There is no rebound.  Musculoskeletal: Normal range of motion. He exhibits tenderness. He exhibits no edema.       Arms: Neurological: He is alert and oriented to person, place, and time.  Skin: Skin is warm and dry.  Psychiatric: He has a normal mood and affect.       Assessment & Plan:  Chest pain:  He is palpably tender along the 7th anterolateral rib and is concerned that a broken rib will interfere with his planned back surgery next week, which is going to be an anterior approach.   I am more concerned about a lobar pneumonia .  I have sent him for chest films and unilateral rib films,  all of which were negative for pneumonia and fracture.

## 2011-02-12 ENCOUNTER — Encounter: Payer: Self-pay | Admitting: Internal Medicine

## 2011-02-12 DIAGNOSIS — Z720 Tobacco use: Secondary | ICD-10-CM | POA: Insufficient documentation

## 2011-02-12 DIAGNOSIS — Z716 Tobacco abuse counseling: Secondary | ICD-10-CM | POA: Insufficient documentation

## 2011-02-12 NOTE — Assessment & Plan Note (Signed)
I have counselled him on the risks of continued tobacco abuse given his known CAD, COPD and upcoming surgery.  He and his wife Lynden Ang have pledged today to quit smoking together.

## 2011-02-14 ENCOUNTER — Other Ambulatory Visit: Payer: Self-pay | Admitting: Internal Medicine

## 2011-02-14 MED ORDER — OMEPRAZOLE 40 MG PO CPDR: 40.0000 mg | DELAYED_RELEASE_CAPSULE | Freq: Every day | ORAL | Status: DC

## 2011-02-22 ENCOUNTER — Encounter: Payer: Self-pay | Admitting: Internal Medicine

## 2011-03-06 ENCOUNTER — Encounter: Payer: Self-pay | Admitting: Internal Medicine

## 2011-03-06 ENCOUNTER — Ambulatory Visit: Payer: Medicare Other | Admitting: Internal Medicine

## 2011-03-06 ENCOUNTER — Ambulatory Visit (INDEPENDENT_AMBULATORY_CARE_PROVIDER_SITE_OTHER): Payer: Medicare Other | Admitting: Internal Medicine

## 2011-03-06 VITALS — BP 94/46 | HR 72 | Temp 98.2°F | Resp 16 | Ht 72.0 in | Wt 205.5 lb

## 2011-03-06 DIAGNOSIS — E785 Hyperlipidemia, unspecified: Secondary | ICD-10-CM

## 2011-03-06 MED ORDER — SIMVASTATIN 40 MG PO TABS: 40.0000 mg | ORAL_TABLET | Freq: Every evening | ORAL | Status: DC

## 2011-03-06 MED ORDER — AMOXICILLIN-POT CLAVULANATE 875-125 MG PO TABS: 1.0000 | ORAL_TABLET | Freq: Two times a day (BID) | ORAL | Status: AC

## 2011-03-06 NOTE — Progress Notes (Signed)
Subjective:    Patient ID: [name], male    DOB: [birth year], 65 y.o.   MRN: [id number]  HPI  65 yo white male with history of spinal stenosis,prior surgeries,  COPD due to history of tobacco abuse, recent CAD found during preoperative evaluation for back surgery, now s/p PTCA/stent, recently started on  Crestor, previously taking simvastatin 50 mg daily.  Quit smoking one week ago.  Has been having increased myalgias on a daily basis, since starting crestor. Want to resume simvastatin.[name]  His LDL was 83 on simvastatin  but he was switched to crestor after his cardiac catheterization .    Past Medical History  Diagnosis Date  . Coronary artery disease   . COPD (chronic obstructive pulmonary disease)   . Hypertension   . Hyperlipidemia   . History of kidney stones   . History of prostate cancer   . MI (myocardial infarction) 03/2001  . Neuromuscular disorder    Current Outpatient Prescriptions on File Prior to Visit  Medication Sig Dispense Refill  . aspirin 325 MG tablet Take 1 tablet (325 mg total) by mouth daily.  30 tablet  0  . budesonide-formoterol (SYMBICORT) 160-4.5 MCG/ACT inhaler Inhale 2 puffs into the lungs 2 (two) times daily.  1 Inhaler  12  . carvedilol (COREG) 12.5 MG tablet Take 1 tablet (12.5 mg total) by mouth 2 (two) times daily.  30 tablet  6  . clopidogrel (PLAVIX) 75 MG tablet Take 1 tablet (75 mg total) by mouth daily.  30 tablet  11  . Fluticasone-Salmeterol (ADVAIR DISKUS) 250-50 MCG/DOSE AEPB Inhale 1 puff into the lungs 2 (two) times daily.  14 each  11  . furosemide (LASIX) 40 MG tablet Take 40 mg by mouth daily.        [name] HYDROcodone-acetaminophen (VICODIN) 5-500 MG per tablet Take 1 tablet by mouth every 6 (six) hours as needed for pain (or cough).  60 tablet  0  . methocarbamol (ROBAXIN) 500 MG tablet Take 500 mg by mouth 2 (two) times daily.        [name] omeprazole (PRILOSEC) 40 MG capsule Take 1 capsule (40 mg total) by mouth daily.  30 capsule  3  .  phenazopyridine (PYRIDIUM) 200 MG tablet Take 200 mg by mouth 3 (three) times daily as needed.        . potassium chloride SA (K-DUR,KLOR-CON) 20 MEQ tablet Take 20 mEq by mouth daily.        . simvastatin (ZOCOR) 80 MG tablet Take 40 mg by mouth at bedtime.       . Tamsulosin HCl (FLOMAX) 0.4 MG CAPS Take 0.4 mg by mouth daily.        [name] tiotropium (SPIRIVA HANDIHALER) 18 MCG inhalation capsule Place 1 capsule (18 mcg total) into inhaler and inhale daily.  30 capsule  2  . traMADol (ULTRAM) 50 MG tablet Take 50 mg by mouth 4 (four) times daily as needed.           Review of Systems  Constitutional: Negative for fever, chills, diaphoresis, activity change, appetite change, fatigue and unexpected weight change.  HENT: Negative for hearing loss, ear pain, nosebleeds, congestion, sore throat, facial swelling, rhinorrhea, sneezing, drooling, mouth sores, trouble swallowing, neck pain, neck stiffness, dental problem, voice change, postnasal drip, sinus pressure, tinnitus and ear discharge.   Eyes: Negative for photophobia, pain, discharge, redness, itching and visual disturbance.  Respiratory: Positive for cough. Negative for apnea, choking, chest tightness, shortness of breath, wheezing  and stridor.   Cardiovascular: Positive for chest pain. Negative for palpitations and leg swelling.  Gastrointestinal: Negative for nausea, vomiting, abdominal pain, diarrhea, constipation, blood in stool, abdominal distention, anal bleeding and rectal pain.  Genitourinary: Negative for dysuria, urgency, frequency, hematuria, flank pain, decreased urine volume, scrotal swelling, difficulty urinating and testicular pain.  Musculoskeletal: Negative for myalgias, back pain, joint swelling, arthralgias and gait problem.  Skin: Negative for color change, rash and wound.  Neurological: Negative for dizziness, tremors, seizures, syncope, speech difficulty, weakness, light-headedness, numbness and headaches.    Psychiatric/Behavioral: Negative for suicidal ideas, hallucinations, behavioral problems, confusion, sleep disturbance, dysphoric mood, decreased concentration and agitation. The patient is not nervous/anxious.        Objective:   Physical Exam  Constitutional: He is oriented to person, place, and time.  HENT:  Head: Normocephalic and atraumatic.  Mouth/Throat: Oropharynx is clear and moist.  Eyes: Conjunctivae and EOM are normal.  Neck: Normal range of motion. Neck supple. No JVD present. No thyromegaly present.  Cardiovascular: Normal rate, regular rhythm and normal heart sounds.   Pulmonary/Chest: Effort normal and breath sounds normal. He has no wheezes. He has no rales.  Abdominal: Soft. Bowel sounds are normal. He exhibits no mass. There is no tenderness. There is no rebound.  Musculoskeletal: Normal range of motion. He exhibits no edema.  Neurological: He is alert and oriented to person, place, and time.  Skin: Skin is warm and dry.  Psychiatric: He has a normal mood and affect.          Assessment & Plan:  Tobacco abuse:  Congratulated him on quitting smoking.  Hyperlipidemia:  Will resume simvastatin for increased myalgias which are aggravating his chronic back pain and limiting his mobility. Repeat  lipids in 6 weeks.

## 2011-03-06 NOTE — Patient Instructions (Signed)
I have sent simvastatin and augmentin to your pharmacy should you need either.    The simvastatin is the cholesterol medication,  and the augmentin is the antibiotic

## 2011-03-14 ENCOUNTER — Encounter: Payer: Self-pay | Admitting: Cardiovascular Disease

## 2011-03-17 ENCOUNTER — Other Ambulatory Visit: Payer: Medicare Other

## 2011-03-21 ENCOUNTER — Ambulatory Visit: Payer: Medicare Other | Admitting: Cardiovascular Disease

## 2011-04-19 ENCOUNTER — Telehealth: Payer: Self-pay | Admitting: Cardiovascular Disease

## 2011-04-19 DIAGNOSIS — R5383 Other fatigue: Secondary | ICD-10-CM

## 2011-04-19 DIAGNOSIS — R609 Edema, unspecified: Secondary | ICD-10-CM

## 2011-04-19 DIAGNOSIS — Z9889 Other specified postprocedural states: Secondary | ICD-10-CM

## 2011-04-19 DIAGNOSIS — R0602 Shortness of breath: Secondary | ICD-10-CM

## 2011-04-19 NOTE — Telephone Encounter (Signed)
Patient's wife Vickie called and Hussain is having swelling even with taking his lasix, his color is also not looking well per wife.  Would like RN to call her and discuss.

## 2011-04-19 NOTE — Telephone Encounter (Signed)
Spoke to pt, he's had decr energy since back surgery just done on 03/22/11, muscle cramps in legs (likely due to statin--restarted simva 40mg  after stopping due to cramps), and BLE swelling since beginning of Dec 2012. Pt takes Lasix 40mg  QOD PRN, and has been taking about QOD since 12/5. Keeps legs elevated when possible, watching fluids, etc. Wife states that his "color is not normal," and pt's only c/o is fatigue/weakness since back surgery; wife thinks his BP has been running low.  They have not been checking his BP at home, advised they start and call me with BP/HR results, advised to hold coreg and lisinopril for SBP <100. For swelling, told pt to come in tomorrow for labs and will evaluate BMP/BNP, will check CBC due to fatigue and s/p surgery. When pt gets home today will call office with BP.  Dr. Mariah Milling, his last echo 01/02/11 showed EF 26%, do you want me to have him incr lasix or wait until lab results. Also, please advise regarding returned muscle cramps after restarting simva 40.  H/o CAD, MI '02 with stent, neg myoview 2010, cath 12/2010 with bare metal stent LAD. Last ov with you 01/19/11.

## 2011-04-20 ENCOUNTER — Ambulatory Visit (INDEPENDENT_AMBULATORY_CARE_PROVIDER_SITE_OTHER): Payer: Medicare Other | Admitting: *Deleted

## 2011-04-20 ENCOUNTER — Telehealth: Payer: Self-pay | Admitting: *Deleted

## 2011-04-20 DIAGNOSIS — E785 Hyperlipidemia, unspecified: Secondary | ICD-10-CM

## 2011-04-20 DIAGNOSIS — R5383 Other fatigue: Secondary | ICD-10-CM

## 2011-04-20 DIAGNOSIS — Z79899 Other long term (current) drug therapy: Secondary | ICD-10-CM

## 2011-04-20 DIAGNOSIS — R0602 Shortness of breath: Secondary | ICD-10-CM

## 2011-04-20 DIAGNOSIS — R609 Edema, unspecified: Secondary | ICD-10-CM | POA: Diagnosis not present

## 2011-04-20 DIAGNOSIS — Z9889 Other specified postprocedural states: Secondary | ICD-10-CM

## 2011-04-20 DIAGNOSIS — R5381 Other malaise: Secondary | ICD-10-CM | POA: Diagnosis not present

## 2011-04-20 NOTE — Progress Notes (Signed)
See phone note 04/19/11. Pt's BP low today at 80/40, HR 67. Pale skin color. He has taken his AM Coreg and Lisinopril. He has not taken any lasix this AM. Pt denies any symptoms, dizziness, SOB, etc. Pt's baseline BP low 100s systolic. He had back surgery beginning of December, is taking Percocet for pain. No fever today. C/O only soreness at site, no swelling, no radiating pain anywhere else. He called yesterday mainly for c/o BLE edema. He does have mild pitting edema bilaterally 1+. Pt states this is about normal for him. I told pt to hold all BP meds today, Coreg, Lisinopril, Lasix and Potassium, monitor BP and if continues to drop OR develops symptoms to go to ER. We do not have MD in office this AM. Otherwise, pt will call back in AM with BP readings. We have drawn BMP/BNP, CBC. Will forward to Dr. Mariah Milling for any further recommendations.

## 2011-04-20 NOTE — Telephone Encounter (Signed)
After reviewing info from this AM note re low BP; per Dr. Mariah Milling, advised pt to hold everything discussed except he can go back on 1/2 dose of the Coreg since HR this AM was 67. Pt will now take coreg 6.25 BID. Pt will call back in AM with BP numbers.

## 2011-04-20 NOTE — Patient Instructions (Signed)
Your BP is low today 80/40 HR 67. Hold your Coreg, Lisinopril, Lasix and Potassium today.  Monitor BP/HR today. If BP continues to drop OR you develop ANY symptoms: Dizziness, lightheadedness, SOB, CP, etc. Go to the ER.  Otherwise, call the office in the AM for update of BP. We will also have your lab results tomorrow AM.

## 2011-04-21 ENCOUNTER — Telehealth: Payer: Self-pay | Admitting: *Deleted

## 2011-04-21 ENCOUNTER — Inpatient Hospital Stay: Payer: Self-pay | Admitting: Internal Medicine

## 2011-04-21 DIAGNOSIS — J9610 Chronic respiratory failure, unspecified whether with hypoxia or hypercapnia: Secondary | ICD-10-CM | POA: Diagnosis not present

## 2011-04-21 DIAGNOSIS — Z23 Encounter for immunization: Secondary | ICD-10-CM | POA: Diagnosis not present

## 2011-04-21 DIAGNOSIS — J4489 Other specified chronic obstructive pulmonary disease: Secondary | ICD-10-CM | POA: Diagnosis not present

## 2011-04-21 DIAGNOSIS — R0902 Hypoxemia: Secondary | ICD-10-CM | POA: Diagnosis not present

## 2011-04-21 DIAGNOSIS — J449 Chronic obstructive pulmonary disease, unspecified: Secondary | ICD-10-CM | POA: Diagnosis not present

## 2011-04-21 DIAGNOSIS — I2510 Atherosclerotic heart disease of native coronary artery without angina pectoris: Secondary | ICD-10-CM | POA: Diagnosis not present

## 2011-04-21 DIAGNOSIS — Z9861 Coronary angioplasty status: Secondary | ICD-10-CM | POA: Diagnosis not present

## 2011-04-21 DIAGNOSIS — D62 Acute posthemorrhagic anemia: Secondary | ICD-10-CM | POA: Diagnosis not present

## 2011-04-21 DIAGNOSIS — Z7982 Long term (current) use of aspirin: Secondary | ICD-10-CM | POA: Diagnosis not present

## 2011-04-21 DIAGNOSIS — D649 Anemia, unspecified: Secondary | ICD-10-CM | POA: Diagnosis not present

## 2011-04-21 DIAGNOSIS — I959 Hypotension, unspecified: Secondary | ICD-10-CM | POA: Diagnosis not present

## 2011-04-21 DIAGNOSIS — Z87891 Personal history of nicotine dependence: Secondary | ICD-10-CM | POA: Diagnosis not present

## 2011-04-21 DIAGNOSIS — J441 Chronic obstructive pulmonary disease with (acute) exacerbation: Secondary | ICD-10-CM | POA: Diagnosis not present

## 2011-04-21 DIAGNOSIS — M549 Dorsalgia, unspecified: Secondary | ICD-10-CM | POA: Diagnosis present

## 2011-04-21 DIAGNOSIS — E785 Hyperlipidemia, unspecified: Secondary | ICD-10-CM | POA: Diagnosis present

## 2011-04-21 DIAGNOSIS — N400 Enlarged prostate without lower urinary tract symptoms: Secondary | ICD-10-CM | POA: Diagnosis present

## 2011-04-21 DIAGNOSIS — Z888 Allergy status to other drugs, medicaments and biological substances status: Secondary | ICD-10-CM | POA: Diagnosis not present

## 2011-04-21 DIAGNOSIS — E860 Dehydration: Secondary | ICD-10-CM | POA: Diagnosis present

## 2011-04-21 DIAGNOSIS — R5383 Other fatigue: Secondary | ICD-10-CM | POA: Diagnosis not present

## 2011-04-21 DIAGNOSIS — G8929 Other chronic pain: Secondary | ICD-10-CM | POA: Diagnosis not present

## 2011-04-21 DIAGNOSIS — K920 Hematemesis: Secondary | ICD-10-CM | POA: Diagnosis not present

## 2011-04-21 DIAGNOSIS — Z79899 Other long term (current) drug therapy: Secondary | ICD-10-CM | POA: Diagnosis not present

## 2011-04-21 DIAGNOSIS — R5381 Other malaise: Secondary | ICD-10-CM | POA: Diagnosis not present

## 2011-04-21 DIAGNOSIS — Z7902 Long term (current) use of antithrombotics/antiplatelets: Secondary | ICD-10-CM | POA: Diagnosis not present

## 2011-04-21 DIAGNOSIS — Z885 Allergy status to narcotic agent status: Secondary | ICD-10-CM | POA: Diagnosis not present

## 2011-04-21 LAB — CBC
HCT: 25.3 % — ABNORMAL LOW (ref 40.0–52.0)
HGB: 8.4 g/dL — ABNORMAL LOW (ref 13.0–18.0)
MCHC: 33 g/dL (ref 32.0–36.0)
Platelet: 222 10*3/uL (ref 150–440)
RBC: 2.56 10*6/uL — ABNORMAL LOW (ref 4.40–5.90)
RDW: 14.2 % (ref 11.5–14.5)
WBC: 8.6 10*3/uL (ref 3.8–10.6)

## 2011-04-21 LAB — URINALYSIS, COMPLETE
Bilirubin,UR: NEGATIVE
Blood: NEGATIVE
Leukocyte Esterase: NEGATIVE
Ph: 5 (ref 4.5–8.0)
Protein: NEGATIVE
RBC,UR: NONE SEEN /HPF (ref 0–5)
Specific Gravity: 1.013 (ref 1.003–1.030)
Squamous Epithelial: NONE SEEN
WBC UR: 2 /HPF (ref 0–5)

## 2011-04-21 LAB — COMPREHENSIVE METABOLIC PANEL
ALT: 12 IU/L (ref 0–44)
AST: 20 IU/L (ref 0–40)
Albumin/Globulin Ratio: 1.7 (ref 1.1–2.5)
Anion Gap: 8 (ref 7–16)
BUN: 19 mg/dL — ABNORMAL HIGH (ref 7–18)
Bilirubin,Total: 1 mg/dL (ref 0.2–1.0)
Chloride: 105 mmol/L (ref 97–108)
Chloride: 111 mmol/L — ABNORMAL HIGH (ref 98–107)
Creatinine: 1.3 mg/dL (ref 0.60–1.30)
EGFR (African American): >60
GFR calc Af Amer: 58 mL/min/{1.73_m2} — ABNORMAL LOW (ref >59)
GFR calc non Af Amer: 50 mL/min/{1.73_m2} — ABNORMAL LOW (ref >59)
Glucose: 107 mg/dL — ABNORMAL HIGH (ref 65–99)
Glucose: 114 mg/dL — ABNORMAL HIGH (ref 65–99)
Osmolality: 292 (ref 275–301)
Potassium: 4.3 mmol/L (ref 3.5–5.2)
Potassium: 4.6 mmol/L (ref 3.5–5.1)
SGOT(AST): 33 U/L (ref 15–37)
Sodium: 140 mmol/L (ref 134–144)
Sodium: 145 mmol/L (ref 136–145)
Total Bilirubin: 1.2 mg/dL (ref 0.0–1.2)
Total Protein: 6.2 g/dL — ABNORMAL LOW (ref 6.4–8.2)

## 2011-04-21 LAB — CBC WITH DIFFERENTIAL/PLATELET
Basophils Absolute: 0 10*3/uL (ref 0.0–0.2)
Eosinophils Absolute: 0.8 10*3/uL — ABNORMAL HIGH (ref 0.0–0.4)
Immature Grans (Abs): 0 10*3/uL (ref 0.0–0.1)
Immature Granulocytes: 0 % (ref 0–2)
Lymphs: 18 % (ref 14–46)
MCH: 31.4 pg (ref 26.6–33.0)
MCHC: 33.3 g/dL (ref 31.5–35.7)
Monocytes: 9 % (ref 4–13)
Neutrophils Relative %: 62 % (ref 40–74)
RDW: 13.5 % (ref 12.3–15.4)

## 2011-04-21 LAB — IRON AND TIBC: Iron: 55 ug/dL — ABNORMAL LOW (ref 65–175)

## 2011-04-21 LAB — TROPONIN I: Troponin-I: <0.02 ng/mL

## 2011-04-21 LAB — BRAIN NATRIURETIC PEPTIDE: BNP: 194.2 pg/mL — ABNORMAL HIGH (ref 0.0–100.0)

## 2011-04-21 LAB — PRO B NATRIURETIC PEPTIDE: B-Type Natriuretic Peptide: 1234 pg/mL — ABNORMAL HIGH (ref 0–125)

## 2011-04-21 NOTE — Telephone Encounter (Signed)
After getting lab results this AM, called pt and advised wife take him to ER. Hct 24.6/ Hgb 8.2 (3 mo prior Hct 42.6/ Hgb 14.0) and pt had back surgery by Dr. Trey Sailors early 03/22/11. Yesterday in office BP 80/40, but pt was asymptomatic so told to hold BP meds and lasix and monitor. This AM after no meds pt's BP at home 123/75 HR 73. Pt has no c/o at this time. His wife will take him to ER for low blood and recent h/o hypotension after recent surgery. Charge nurse at ER aware and labs sent. Will also notify Dr. Darrick Huntsman, pt's pcp.

## 2011-04-22 DIAGNOSIS — J9610 Chronic respiratory failure, unspecified whether with hypoxia or hypercapnia: Secondary | ICD-10-CM

## 2011-04-22 DIAGNOSIS — I2510 Atherosclerotic heart disease of native coronary artery without angina pectoris: Secondary | ICD-10-CM

## 2011-04-22 DIAGNOSIS — R0902 Hypoxemia: Secondary | ICD-10-CM

## 2011-04-22 DIAGNOSIS — D62 Acute posthemorrhagic anemia: Secondary | ICD-10-CM

## 2011-04-22 LAB — CBC WITH DIFFERENTIAL/PLATELET
Eosinophil %: 10 %
HCT: 29 % — ABNORMAL LOW (ref 40.0–52.0)
MCH: 32 pg (ref 26.0–34.0)
MCHC: 32.6 g/dL (ref 32.0–36.0)
MCV: 98 fL (ref 80–100)
Monocyte #: 0.6 10*3/uL (ref 0.0–0.7)
Monocyte %: 6.4 %
Platelet: 203 10*3/uL (ref 150–440)
RBC: 2.95 10*6/uL — ABNORMAL LOW (ref 4.40–5.90)
RDW: 14.9 % — ABNORMAL HIGH (ref 11.5–14.5)
WBC: 9.2 10*3/uL (ref 3.8–10.6)

## 2011-04-22 LAB — BASIC METABOLIC PANEL
Anion Gap: 6 — ABNORMAL LOW (ref 7–16)
BUN: 17 mg/dL (ref 7–18)
Chloride: 107 mmol/L (ref 98–107)
Co2: 33 mmol/L — ABNORMAL HIGH (ref 21–32)
EGFR (African American): >60
Osmolality: 293 (ref 275–301)
Potassium: 4.2 mmol/L (ref 3.5–5.1)

## 2011-04-23 LAB — RENAL FUNCTION PANEL
Albumin: 3.2 g/dL — ABNORMAL LOW (ref 3.4–5.0)
Anion Gap: 7 (ref 7–16)
BUN: 16 mg/dL (ref 7–18)
Chloride: 107 mmol/L (ref 98–107)
Co2: 31 mmol/L (ref 21–32)
Creatinine: 1.22 mg/dL (ref 0.60–1.30)
EGFR (African American): >60
EGFR (Non-African Amer.): >60
Osmolality: 290 (ref 275–301)
Sodium: 145 mmol/L (ref 136–145)

## 2011-04-23 LAB — CBC WITH DIFFERENTIAL/PLATELET
Basophil #: 0 10*3/uL (ref 0.0–0.1)
Basophil %: 0.3 %
Eosinophil #: 0.8 10*3/uL — ABNORMAL HIGH (ref 0.0–0.7)
Eosinophil %: 8.6 %
HGB: 9.1 g/dL — ABNORMAL LOW (ref 13.0–18.0)
Lymphocyte #: 1.7 10*3/uL (ref 1.0–3.6)
Lymphocyte %: 17.6 %
MCHC: 32.8 g/dL (ref 32.0–36.0)
Monocyte %: 6.2 %
Neutrophil #: 6.4 10*3/uL (ref 1.4–6.5)
Neutrophil %: 67.3 %
RDW: 15.1 % — ABNORMAL HIGH (ref 11.5–14.5)
WBC: 9.5 10*3/uL (ref 3.8–10.6)

## 2011-04-23 LAB — OCCULT BLOOD X 1 CARD TO LAB, STOOL: Occult Blood, Feces: NEGATIVE

## 2011-04-23 LAB — CK TOTAL AND CKMB (NOT AT ARMC)
CK, Total: 61 U/L (ref 35–232)
CK-MB: 0.9 ng/mL (ref 0.5–3.6)

## 2011-04-23 LAB — TROPONIN I: Troponin-I: <0.02 ng/mL

## 2011-04-24 ENCOUNTER — Other Ambulatory Visit: Payer: Self-pay | Admitting: Internal Medicine

## 2011-04-24 ENCOUNTER — Encounter: Payer: Self-pay | Admitting: Internal Medicine

## 2011-04-24 DIAGNOSIS — D62 Acute posthemorrhagic anemia: Secondary | ICD-10-CM | POA: Insufficient documentation

## 2011-04-24 DIAGNOSIS — J9611 Chronic respiratory failure with hypoxia: Secondary | ICD-10-CM | POA: Insufficient documentation

## 2011-04-24 DIAGNOSIS — D509 Iron deficiency anemia, unspecified: Secondary | ICD-10-CM | POA: Diagnosis not present

## 2011-04-24 MED ORDER — CARVEDILOL 3.125 MG PO TABS: 3.1250 mg | ORAL_TABLET | Freq: Two times a day (BID) | ORAL | Status: DC

## 2011-04-24 MED ORDER — ASPIRIN 325 MG PO TABS: 325.0000 mg | ORAL_TABLET | Freq: Every day | ORAL | Status: DC

## 2011-04-24 NOTE — Assessment & Plan Note (Signed)
Requiring transfusion of 1 unit for symptoms of hypotension, hypoxia in settign of known CAD<  Hgb was 9.1 at dc and he will undergo EGD/colonoscopy this week by Mechele Collin.

## 2011-04-24 NOTE — Telephone Encounter (Signed)
I spoke to pt's wife this AM to f/u on him. He did go to ER Fri, was admitted, received blood transfusion. He has been discharged, is staying on clear liquids until f/u with GI today at 1:30. BP this AM 117/75 HR 67. I will also send our labwork to Dr. Trey Sailors his back surgery per pt's request. Wife will notify them of what has occurred since surgery.

## 2011-04-25 ENCOUNTER — Ambulatory Visit: Payer: Self-pay | Admitting: Unknown Physician Specialty

## 2011-04-25 DIAGNOSIS — K922 Gastrointestinal hemorrhage, unspecified: Secondary | ICD-10-CM | POA: Diagnosis not present

## 2011-04-25 DIAGNOSIS — Z8371 Family history of colonic polyps: Secondary | ICD-10-CM | POA: Diagnosis not present

## 2011-04-25 DIAGNOSIS — Z9861 Coronary angioplasty status: Secondary | ICD-10-CM | POA: Diagnosis not present

## 2011-04-25 DIAGNOSIS — Z87891 Personal history of nicotine dependence: Secondary | ICD-10-CM | POA: Diagnosis not present

## 2011-04-25 DIAGNOSIS — Z79899 Other long term (current) drug therapy: Secondary | ICD-10-CM | POA: Diagnosis not present

## 2011-04-25 DIAGNOSIS — I10 Essential (primary) hypertension: Secondary | ICD-10-CM | POA: Diagnosis not present

## 2011-04-25 DIAGNOSIS — Z8546 Personal history of malignant neoplasm of prostate: Secondary | ICD-10-CM | POA: Diagnosis not present

## 2011-04-25 DIAGNOSIS — K648 Other hemorrhoids: Secondary | ICD-10-CM | POA: Diagnosis not present

## 2011-04-25 DIAGNOSIS — I2510 Atherosclerotic heart disease of native coronary artery without angina pectoris: Secondary | ICD-10-CM | POA: Diagnosis not present

## 2011-04-25 DIAGNOSIS — I252 Old myocardial infarction: Secondary | ICD-10-CM | POA: Diagnosis not present

## 2011-04-25 DIAGNOSIS — E785 Hyperlipidemia, unspecified: Secondary | ICD-10-CM | POA: Diagnosis not present

## 2011-04-25 DIAGNOSIS — Z801 Family history of malignant neoplasm of trachea, bronchus and lung: Secondary | ICD-10-CM | POA: Diagnosis not present

## 2011-04-25 DIAGNOSIS — K2270 Barrett's esophagus without dysplasia: Secondary | ICD-10-CM | POA: Diagnosis not present

## 2011-04-25 DIAGNOSIS — D509 Iron deficiency anemia, unspecified: Secondary | ICD-10-CM | POA: Diagnosis not present

## 2011-04-25 DIAGNOSIS — M129 Arthropathy, unspecified: Secondary | ICD-10-CM | POA: Diagnosis not present

## 2011-04-25 DIAGNOSIS — D1330 Benign neoplasm of unspecified part of small intestine: Secondary | ICD-10-CM | POA: Diagnosis not present

## 2011-04-25 DIAGNOSIS — Z86010 Personal history of colonic polyps: Secondary | ICD-10-CM | POA: Diagnosis not present

## 2011-04-25 DIAGNOSIS — Z800 Family history of malignant neoplasm of digestive organs: Secondary | ICD-10-CM | POA: Diagnosis not present

## 2011-04-25 DIAGNOSIS — K2980 Duodenitis without bleeding: Secondary | ICD-10-CM | POA: Diagnosis not present

## 2011-04-26 ENCOUNTER — Telehealth: Payer: Self-pay | Admitting: *Deleted

## 2011-04-26 MED ORDER — EZETIMIBE-SIMVASTATIN 10-20 MG PO TABS: 1.0000 | ORAL_TABLET | Freq: Every day | ORAL | Status: DC

## 2011-04-26 NOTE — Telephone Encounter (Signed)
Pt's wife called stating upper/lower endoscopy both showed no sign of bleed. He has CT scan -with contrast tomorrow to evaluate reason for recent low hgb/hct and low bp post back surgery 03/22/11. Pt has been instructed to hold his Plavix and ASA by hospital. Dr. Mariah Milling would like pt to ideally be on both Plavix and ASA with bare-metal stent placed 4 mos ago, but until reason for low blood count determined, he would like pt to atleast restart ASA, and wants to verify with Dr. Darrick Huntsman prior to him doing this to see if ok. Pt is also going to see Dr. Nanda Quinton soon for hematology if CT normal. Pt also has h/o Prostat CA, and wife thinks that something has been going on for a while, "his color has not been good even before his surgery, and BP has been running low prior to sx too." He has f/u with urologist Dr. Annabell Howells 1/23. Will forward to Dr. Darrick Huntsman to see if pt can restart ASA per Dr. Mariah Milling.

## 2011-04-26 NOTE — Telephone Encounter (Signed)
I am ok with resuming ASA now and plavix if there were no signs of bleeding on CT scan

## 2011-04-26 NOTE — Telephone Encounter (Signed)
Per Dr. Mariah Milling, pt can try Vytorin 10/20 since cannot tolerate Simvastatin 40. Rx sent in, and pt will try samples prior.

## 2011-04-26 NOTE — Telephone Encounter (Signed)
Pt notified to start ASA 81 now, and if CT scan normal tomorrow, restart Plavix also.

## 2011-04-27 ENCOUNTER — Ambulatory Visit: Payer: Self-pay | Admitting: Unknown Physician Specialty

## 2011-04-27 ENCOUNTER — Ambulatory Visit: Payer: Self-pay | Admitting: Oncology

## 2011-04-27 DIAGNOSIS — J9819 Other pulmonary collapse: Secondary | ICD-10-CM | POA: Diagnosis not present

## 2011-04-27 DIAGNOSIS — Z9889 Other specified postprocedural states: Secondary | ICD-10-CM | POA: Diagnosis not present

## 2011-04-27 DIAGNOSIS — D509 Iron deficiency anemia, unspecified: Secondary | ICD-10-CM | POA: Diagnosis not present

## 2011-04-27 DIAGNOSIS — K4090 Unilateral inguinal hernia, without obstruction or gangrene, not specified as recurrent: Secondary | ICD-10-CM | POA: Diagnosis not present

## 2011-04-27 DIAGNOSIS — I700 Atherosclerosis of aorta: Secondary | ICD-10-CM | POA: Diagnosis not present

## 2011-04-27 DIAGNOSIS — K8020 Calculus of gallbladder without cholecystitis without obstruction: Secondary | ICD-10-CM | POA: Diagnosis not present

## 2011-04-27 DIAGNOSIS — K429 Umbilical hernia without obstruction or gangrene: Secondary | ICD-10-CM | POA: Diagnosis not present

## 2011-04-27 DIAGNOSIS — I714 Abdominal aortic aneurysm, without rupture: Secondary | ICD-10-CM | POA: Diagnosis not present

## 2011-04-28 ENCOUNTER — Encounter: Payer: Self-pay | Admitting: Internal Medicine

## 2011-04-30 NOTE — Telephone Encounter (Signed)
Can we try to get the CT for our records?

## 2011-05-01 ENCOUNTER — Other Ambulatory Visit: Payer: Self-pay | Admitting: *Deleted

## 2011-05-01 MED ORDER — METHOCARBAMOL 500 MG PO TABS: 500.0000 mg | ORAL_TABLET | Freq: Two times a day (BID) | ORAL | Status: DC

## 2011-05-01 NOTE — Telephone Encounter (Signed)
I don't think I forwarded note, please see below.

## 2011-05-01 NOTE — Telephone Encounter (Signed)
Faxed request from [facility], last filled 01/31/11

## 2011-05-01 NOTE — Telephone Encounter (Signed)
Spoke to pt's wife, notified her I reviewed CT scan at [facility], no sign of bleed. He will f/u with Dr. Ether Griffins for anemia. Also, pt now has incr swelling in ankles, I had him hold his Lasix when BP was running low. Now BP normal at 114/67, HR 72, I told pt to restart his Lasix (he was on 40mg  QOD), told him to restart at 1/2 tab qod and titrate to 40 qod as needed. He is going to restart Plavix now that CT normal. Also, pt cannot take Vytorin he had severe indigestion after taking (we changed to 10/20 when could not tolerate simva 40. He also cannot take Crestor. Please advise what chol med to change him to.

## 2011-05-02 ENCOUNTER — Telehealth: Payer: Self-pay | Admitting: *Deleted

## 2011-05-02 NOTE — Telephone Encounter (Signed)
Completed forms given back to patient.

## 2011-05-02 NOTE — Telephone Encounter (Signed)
Pt has dropped off a form that needs to be completed so that he can get a refund on a trip that was planned, that he will not be able to take.  Form and note from pt's wife are in your red folder.

## 2011-05-03 NOTE — Telephone Encounter (Signed)
Can he take lipitor?

## 2011-05-04 ENCOUNTER — Telehealth: Payer: Self-pay

## 2011-05-04 ENCOUNTER — Other Ambulatory Visit: Payer: Self-pay | Admitting: *Deleted

## 2011-05-04 MED ORDER — ATORVASTATIN CALCIUM 20 MG PO TABS: 20.0000 mg | ORAL_TABLET | Freq: Every day | ORAL | Status: DC

## 2011-05-04 NOTE — Telephone Encounter (Signed)
Faxed request from [facility], last filled 01/02/11.

## 2011-05-04 NOTE — Telephone Encounter (Signed)
A new Rx was sent for atorvastatin 20 mg one tablet daily.

## 2011-05-04 NOTE — Telephone Encounter (Signed)
Spoke with wife regarding cholesterol medication change. The wife states never had lipitor and would like to try it. What dose of lipitor do you want sent in? The patient is still having swelling in legs, feet and ankles; patient takes lasix 40 mg every other day with potassium 20 meq one tablet daily. The patient has noticed some cramping in legs since fluid pill changed. Please advise what to do.

## 2011-05-05 ENCOUNTER — Ambulatory Visit: Payer: Medicare Other | Admitting: Internal Medicine

## 2011-05-05 ENCOUNTER — Ambulatory Visit: Payer: Self-pay | Admitting: Oncology

## 2011-05-05 DIAGNOSIS — Z79899 Other long term (current) drug therapy: Secondary | ICD-10-CM | POA: Diagnosis not present

## 2011-05-05 DIAGNOSIS — Z95818 Presence of other cardiac implants and grafts: Secondary | ICD-10-CM | POA: Diagnosis not present

## 2011-05-05 DIAGNOSIS — J449 Chronic obstructive pulmonary disease, unspecified: Secondary | ICD-10-CM | POA: Diagnosis not present

## 2011-05-05 DIAGNOSIS — I10 Essential (primary) hypertension: Secondary | ICD-10-CM | POA: Diagnosis not present

## 2011-05-05 DIAGNOSIS — Z8546 Personal history of malignant neoplasm of prostate: Secondary | ICD-10-CM | POA: Diagnosis not present

## 2011-05-05 DIAGNOSIS — Z7982 Long term (current) use of aspirin: Secondary | ICD-10-CM | POA: Diagnosis not present

## 2011-05-05 DIAGNOSIS — I2510 Atherosclerotic heart disease of native coronary artery without angina pectoris: Secondary | ICD-10-CM | POA: Diagnosis not present

## 2011-05-05 DIAGNOSIS — I252 Old myocardial infarction: Secondary | ICD-10-CM | POA: Diagnosis not present

## 2011-05-05 DIAGNOSIS — D649 Anemia, unspecified: Secondary | ICD-10-CM | POA: Diagnosis not present

## 2011-05-05 LAB — RETICULOCYTES
Absolute Retic Count: 0.109 10*6/uL — ABNORMAL HIGH (ref 0.024–0.084)
Reticulocyte: 3.4 % — ABNORMAL HIGH (ref 0.5–1.5)

## 2011-05-05 LAB — IRON AND TIBC
Iron Saturation: 29 %
Iron: 63 ug/dL — ABNORMAL LOW (ref 65–175)
Unbound Iron-Bind.Cap.: 153 ug/dL

## 2011-05-05 LAB — CBC CANCER CENTER
Basophil %: 0.5 %
Eosinophil %: 7.4 %
HGB: 10.4 g/dL — ABNORMAL LOW (ref 13.0–18.0)
Lymphocyte %: 19.7 %
MCH: 32.6 pg (ref 26.0–34.0)
MCHC: 33.2 g/dL (ref 32.0–36.0)
MCV: 98 fL (ref 80–100)
Monocyte %: 6.3 %
Neutrophil %: 66.1 %
Platelet: 211 x10 3/mm (ref 150–440)
RBC: 3.2 10*6/uL — ABNORMAL LOW (ref 4.40–5.90)
RDW: 14.3 % (ref 11.5–14.5)

## 2011-05-05 LAB — FERRITIN: Ferritin (ARMC): 360 ng/mL (ref 8–388)

## 2011-05-05 LAB — LACTATE DEHYDROGENASE: LDH: 222 U/L (ref 87–241)

## 2011-05-05 LAB — FOLATE: Folic Acid: 89.8 ng/mL (ref 3.1–100.0)

## 2011-05-05 MED ORDER — TRAMADOL HCL 50 MG PO TABS: 50.0000 mg | ORAL_TABLET | Freq: Four times a day (QID) | ORAL | Status: DC | PRN

## 2011-05-05 NOTE — Telephone Encounter (Signed)
A Rx for Lipitor 20 mg was sent to the pharmacy but also need to review other part of note.

## 2011-05-08 LAB — PROT IMMUNOELECTROPHORES(ARMC)

## 2011-05-08 NOTE — Telephone Encounter (Signed)
Spoke to pt's wife, she states swelling now has improved, "gone." He saw Dr. Ether Griffins last Friday, extensive labwork done to determine tx for anemia. He goes back in 2 weeks. I notified of the swelling likely due to anemia. He is going to only take Lasix 40 QOD PRN (and KCl only with lasix), so he is not taking at this time. He also reports he has been taking 1/2 tablet of Lipitor 20mg , no problems at this time, will incr to 20mg  in 1 week. Pt will call back with any changes.

## 2011-05-08 NOTE — Telephone Encounter (Signed)
Will ask Dr. Mariah Milling today what to do about Lasix/Potassium with swelling.

## 2011-05-08 NOTE — Telephone Encounter (Signed)
Spoke to Dr. Mariah Milling, said the swelling likely due to his anemia. He has f/u with Dr. Ether Griffins, will find out if he has already seen. Anemia will need to be addressed first. In the meantime, pt can take Lasix 40mg  QOD as already doing, per TG may take extra dose to see if needed, and can use Ace wraps for legs. Attempted to contact pt, LMOM TCB.

## 2011-05-10 DIAGNOSIS — R339 Retention of urine, unspecified: Secondary | ICD-10-CM | POA: Diagnosis not present

## 2011-05-10 DIAGNOSIS — Z8546 Personal history of malignant neoplasm of prostate: Secondary | ICD-10-CM | POA: Diagnosis not present

## 2011-05-10 DIAGNOSIS — R300 Dysuria: Secondary | ICD-10-CM | POA: Diagnosis not present

## 2011-05-10 DIAGNOSIS — N320 Bladder-neck obstruction: Secondary | ICD-10-CM | POA: Diagnosis not present

## 2011-05-11 DIAGNOSIS — D509 Iron deficiency anemia, unspecified: Secondary | ICD-10-CM | POA: Diagnosis not present

## 2011-05-16 DIAGNOSIS — M549 Dorsalgia, unspecified: Secondary | ICD-10-CM | POA: Diagnosis not present

## 2011-05-19 ENCOUNTER — Ambulatory Visit: Payer: Self-pay | Admitting: Oncology

## 2011-05-19 ENCOUNTER — Encounter: Payer: Self-pay | Admitting: Internal Medicine

## 2011-05-19 DIAGNOSIS — I252 Old myocardial infarction: Secondary | ICD-10-CM | POA: Diagnosis not present

## 2011-05-19 DIAGNOSIS — I2510 Atherosclerotic heart disease of native coronary artery without angina pectoris: Secondary | ICD-10-CM | POA: Diagnosis not present

## 2011-05-19 DIAGNOSIS — J449 Chronic obstructive pulmonary disease, unspecified: Secondary | ICD-10-CM | POA: Diagnosis not present

## 2011-05-19 DIAGNOSIS — D509 Iron deficiency anemia, unspecified: Secondary | ICD-10-CM | POA: Diagnosis not present

## 2011-05-19 DIAGNOSIS — I10 Essential (primary) hypertension: Secondary | ICD-10-CM | POA: Diagnosis not present

## 2011-05-19 DIAGNOSIS — Z95818 Presence of other cardiac implants and grafts: Secondary | ICD-10-CM | POA: Diagnosis not present

## 2011-05-19 DIAGNOSIS — Z7982 Long term (current) use of aspirin: Secondary | ICD-10-CM | POA: Diagnosis not present

## 2011-05-19 DIAGNOSIS — Z8546 Personal history of malignant neoplasm of prostate: Secondary | ICD-10-CM | POA: Diagnosis not present

## 2011-05-19 DIAGNOSIS — Z79899 Other long term (current) drug therapy: Secondary | ICD-10-CM | POA: Diagnosis not present

## 2011-05-19 DIAGNOSIS — M549 Dorsalgia, unspecified: Secondary | ICD-10-CM | POA: Diagnosis not present

## 2011-05-22 ENCOUNTER — Encounter: Payer: Self-pay | Admitting: Internal Medicine

## 2011-05-22 ENCOUNTER — Ambulatory Visit (INDEPENDENT_AMBULATORY_CARE_PROVIDER_SITE_OTHER): Payer: Medicare Other | Admitting: Internal Medicine

## 2011-05-22 VITALS — BP 100/58 | HR 60 | Temp 98.0°F | Wt 202.0 lb

## 2011-05-22 DIAGNOSIS — M549 Dorsalgia, unspecified: Secondary | ICD-10-CM | POA: Diagnosis not present

## 2011-05-22 DIAGNOSIS — R0902 Hypoxemia: Secondary | ICD-10-CM

## 2011-05-22 DIAGNOSIS — J9611 Chronic respiratory failure with hypoxia: Secondary | ICD-10-CM

## 2011-05-22 DIAGNOSIS — D62 Acute posthemorrhagic anemia: Secondary | ICD-10-CM

## 2011-05-22 DIAGNOSIS — F17200 Nicotine dependence, unspecified, uncomplicated: Secondary | ICD-10-CM | POA: Diagnosis not present

## 2011-05-22 DIAGNOSIS — Z720 Tobacco use: Secondary | ICD-10-CM

## 2011-05-22 DIAGNOSIS — A09 Infectious gastroenteritis and colitis, unspecified: Secondary | ICD-10-CM | POA: Insufficient documentation

## 2011-05-22 MED ORDER — OMEPRAZOLE 40 MG PO CPDR: 40.0000 mg | DELAYED_RELEASE_CAPSULE | Freq: Every day | ORAL | Status: DC

## 2011-05-22 NOTE — Assessment & Plan Note (Signed)
Improved, hypoxia has resolved,

## 2011-05-22 NOTE — Assessment & Plan Note (Signed)
He has been tobacco free for nearly 4 months , since his PTCA/stent.

## 2011-05-22 NOTE — Progress Notes (Signed)
Subjective:    Patient ID: [name], male    DOB: 03-05-1946, 66 y.o.   MRN: [id number]  HPI [name] returns for hospital followup following admission doe severe symptomatic anemia post cardiac catheterization and stent.  His  EGD /colonoscopy was normal.  He had an iron trasnfusion  by Dr Orlie Dakin last Friday and is starting to feel better.  He has also seen his urologist Dr. Annabell Howells.  His repeat PSA was < 2.0.  Past Medical History  Diagnosis Date  . Coronary artery disease   . COPD (chronic obstructive pulmonary disease)   . Hypertension   . Hyperlipidemia   . History of kidney stones   . History of prostate cancer   . MI (myocardial infarction) 03/2001  . Neuromuscular disorder    Current Outpatient Prescriptions on File Prior to Visit  Medication Sig Dispense Refill  . aspirin 325 MG tablet Take 1 tablet (325 mg total) by mouth daily.  30 tablet  0  . atorvastatin (LIPITOR) 20 MG tablet Take 1 tablet (20 mg total) by mouth daily.  30 tablet  6  . budesonide-formoterol (SYMBICORT) 160-4.5 MCG/ACT inhaler Inhale 2 puffs into the lungs 2 (two) times daily.  1 Inhaler  12  . carvedilol (COREG) 3.125 MG tablet Take 1 tablet (3.125 mg total) by mouth 2 (two) times daily.  30 tablet  6  . clopidogrel (PLAVIX) 75 MG tablet Take 1 tablet (75 mg total) by mouth daily.  30 tablet  11  . Fluticasone-Salmeterol (ADVAIR DISKUS) 250-50 MCG/DOSE AEPB Inhale 1 puff into the lungs 2 (two) times daily.  14 each  11  . furosemide (LASIX) 40 MG tablet Take 40 mg by mouth daily.        [name] lisinopril (PRINIVIL,ZESTRIL) 10 MG tablet Take 5 mg by mouth daily.        . methocarbamol (ROBAXIN) 500 MG tablet Take 1 tablet (500 mg total) by mouth 2 (two) times daily.  180 tablet  3  . phenazopyridine (PYRIDIUM) 200 MG tablet Take 200 mg by mouth 3 (three) times daily as needed.        . potassium chloride SA (K-DUR,KLOR-CON) 20 MEQ tablet Take 20 mEq by mouth daily.        . Tamsulosin HCl (FLOMAX) 0.4 MG CAPS  Take 0.4 mg by mouth daily.        [name] tiotropium (SPIRIVA HANDIHALER) 18 MCG inhalation capsule Place 1 capsule (18 mcg total) into inhaler and inhale daily.  30 capsule  2  . traMADol (ULTRAM) 50 MG tablet Take 1 tablet (50 mg total) by mouth 4 (four) times daily as needed.  120 tablet  3  . HYDROcodone-acetaminophen (VICODIN) 5-500 MG per tablet Take 1 tablet by mouth every 6 (six) hours as needed for pain (or cough).  60 tablet  0    Review of Systems  Constitutional: Negative for fever, chills, diaphoresis, activity change, appetite change, fatigue and unexpected weight change.  HENT: Negative for hearing loss, ear pain, nosebleeds, congestion, sore throat, facial swelling, rhinorrhea, sneezing, drooling, mouth sores, trouble swallowing, neck pain, neck stiffness, dental problem, voice change, postnasal drip, sinus pressure, tinnitus and ear discharge.   Eyes: Negative for photophobia, pain, discharge, redness, itching and visual disturbance.  Respiratory: Negative for apnea, cough, choking, chest tightness, shortness of breath, wheezing and stridor.   Cardiovascular: Negative for chest pain, palpitations and leg swelling.  Gastrointestinal: Negative for nausea, vomiting, abdominal pain, diarrhea, constipation, blood in stool, abdominal distention,  anal bleeding and rectal pain.  Genitourinary: Negative for dysuria, urgency, frequency, hematuria, flank pain, decreased urine volume, scrotal swelling, difficulty urinating and testicular pain.  Musculoskeletal: Negative for myalgias, back pain, joint swelling, arthralgias and gait problem.  Skin: Negative for color change, rash and wound.  Neurological: Negative for dizziness, tremors, seizures, syncope, speech difficulty, weakness, light-headedness, numbness and headaches.  Psychiatric/Behavioral: Negative for suicidal ideas, hallucinations, behavioral problems, confusion, sleep disturbance, dysphoric mood, decreased concentration and agitation.  The patient is not nervous/anxious.        Objective:   Physical Exam  Constitutional: He is oriented to person, place, and time.  HENT:  Head: Normocephalic and atraumatic.  Mouth/Throat: Oropharynx is clear and moist.  Eyes: Conjunctivae and EOM are normal.  Neck: Normal range of motion. Neck supple. No JVD present. No thyromegaly present.  Cardiovascular: Normal rate, regular rhythm and normal heart sounds.   Pulmonary/Chest: Effort normal and breath sounds normal. He has no wheezes. He has no rales.  Abdominal: Soft. Bowel sounds are normal. He exhibits no mass. There is no tenderness. There is no rebound.  Musculoskeletal: Normal range of motion. He exhibits no edema.  Neurological: He is alert and oriented to person, place, and time.  Skin: Skin is warm and dry.  Psychiatric: He has a normal mood and affect.      Assessment & Plan:   Anemia associated with acute blood loss Etiology unclear,  No GI source was found on EGD and colonoscopy  He is responding to iron  And will followup with Dr. Orlie Dakin.   Tobacco abuse He has been tobacco free for nearly 4 months , since his PTCA/stent.   Chronic respiratory failure with hypoxia Improved, hypoxia has resolved,     Updated Medication List Outpatient Encounter Prescriptions as of 05/22/2011  Medication Sig Dispense Refill  . aspirin 325 MG tablet Take 1 tablet (325 mg total) by mouth daily.  30 tablet  0  . atorvastatin (LIPITOR) 20 MG tablet Take 1 tablet (20 mg total) by mouth daily.  30 tablet  6  . budesonide-formoterol (SYMBICORT) 160-4.5 MCG/ACT inhaler Inhale 2 puffs into the lungs 2 (two) times daily.  1 Inhaler  12  . carvedilol (COREG) 3.125 MG tablet Take 1 tablet (3.125 mg total) by mouth 2 (two) times daily.  30 tablet  6  . clopidogrel (PLAVIX) 75 MG tablet Take 1 tablet (75 mg total) by mouth daily.  30 tablet  11  . Fluticasone-Salmeterol (ADVAIR DISKUS) 250-50 MCG/DOSE AEPB Inhale 1 puff into the lungs 2 (two)  times daily.  14 each  11  . furosemide (LASIX) 40 MG tablet Take 40 mg by mouth daily.        [name] lisinopril (PRINIVIL,ZESTRIL) 10 MG tablet Take 5 mg by mouth daily.        . methocarbamol (ROBAXIN) 500 MG tablet Take 1 tablet (500 mg total) by mouth 2 (two) times daily.  180 tablet  3  . omeprazole (PRILOSEC) 40 MG capsule Take 1 capsule (40 mg total) by mouth daily.  90 capsule  3  . phenazopyridine (PYRIDIUM) 200 MG tablet Take 200 mg by mouth 3 (three) times daily as needed.        . potassium chloride SA (K-DUR,KLOR-CON) 20 MEQ tablet Take 20 mEq by mouth daily.        . Tamsulosin HCl (FLOMAX) 0.4 MG CAPS Take 0.4 mg by mouth daily.        [name] tiotropium (SPIRIVA HANDIHALER) 18  MCG inhalation capsule Place 1 capsule (18 mcg total) into inhaler and inhale daily.  30 capsule  2  . traMADol (ULTRAM) 50 MG tablet Take 1 tablet (50 mg total) by mouth 4 (four) times daily as needed.  120 tablet  3  . DISCONTD: omeprazole (PRILOSEC) 40 MG capsule Take 1 capsule (40 mg total) by mouth daily.  30 capsule  3  . HYDROcodone-acetaminophen (VICODIN) 5-500 MG per tablet Take 1 tablet by mouth every 6 (six) hours as needed for pain (or cough).  60 tablet  0

## 2011-05-22 NOTE — Assessment & Plan Note (Addendum)
Etiology unclear,  No GI source was found on EGD and colonoscopy  He is responding to iron  And will followup with Dr. Orlie Dakin.

## 2011-05-24 ENCOUNTER — Encounter: Payer: Self-pay | Admitting: Internal Medicine

## 2011-05-25 DIAGNOSIS — M549 Dorsalgia, unspecified: Secondary | ICD-10-CM | POA: Diagnosis not present

## 2011-05-26 ENCOUNTER — Encounter: Payer: Self-pay | Admitting: Cardiovascular Disease

## 2011-05-26 ENCOUNTER — Ambulatory Visit (INDEPENDENT_AMBULATORY_CARE_PROVIDER_SITE_OTHER): Payer: Medicare Other | Admitting: Cardiovascular Disease

## 2011-05-26 VITALS — BP 108/73 | HR 59 | Ht 70.0 in | Wt 200.0 lb

## 2011-05-26 DIAGNOSIS — E785 Hyperlipidemia, unspecified: Secondary | ICD-10-CM | POA: Diagnosis not present

## 2011-05-26 DIAGNOSIS — I10 Essential (primary) hypertension: Secondary | ICD-10-CM | POA: Diagnosis not present

## 2011-05-26 DIAGNOSIS — Z720 Tobacco use: Secondary | ICD-10-CM

## 2011-05-26 DIAGNOSIS — I2510 Atherosclerotic heart disease of native coronary artery without angina pectoris: Secondary | ICD-10-CM

## 2011-05-26 DIAGNOSIS — D62 Acute posthemorrhagic anemia: Secondary | ICD-10-CM

## 2011-05-26 DIAGNOSIS — F17200 Nicotine dependence, unspecified, uncomplicated: Secondary | ICD-10-CM | POA: Diagnosis not present

## 2011-05-26 NOTE — Patient Instructions (Signed)
You are doing well. Please decrease the aspirin to 81 mg a day with plavix once a day  Please call us if you have new issues that need to be addressed before your next appt.  Your physician wants you to follow-up in: 6 months.  You will receive a reminder letter in the mail two months in advance. If you don't receive a letter, please call our office to schedule the follow-up appointment.

## 2011-05-26 NOTE — Assessment & Plan Note (Signed)
He has stopped smoking. We have encouraged continued smoking cessation.

## 2011-05-26 NOTE — Assessment & Plan Note (Signed)
Bare metal stent to the LAd placed 5 months ago. Have recommended he continue on asa 81 mg daily with plavix 75 mg daily. If he needs to come off his plavix secondary to anemia or procedures, this should not be a problem.

## 2011-05-26 NOTE — Assessment & Plan Note (Signed)
Blood pressure is well controlled on today's visit. No changes made to the medications. 

## 2011-05-26 NOTE — Assessment & Plan Note (Signed)
Etiology unclear. He has received iron infusion. No labs drawn on todays visit. He appears well.

## 2011-05-26 NOTE — Assessment & Plan Note (Signed)
Cholesterol is at goal on the current lipid regimen. No changes to the medications were made.  

## 2011-05-26 NOTE — Progress Notes (Signed)
Patient ID: [name], male    DOB: [birth year], 66 y.o.   MRN: [id number]  HPI Comments: 66 years old with CAD,  posterior MI in 2002 treated with a bare-metal stent to the circumflex artery,  back surgery by Dr. Noel Gerold x2 In 2009 and 2010, and he had a negative Myoview scan at that time with an ejection fraction of 60% (2010).He had another preop evaluation in February 2011 prior to prostate biopsy and he ended up having a seed implantation. abnormal EKG noted on office visit in 2012,  He had a stress test that showed anterior wall ischemia. Cardiac catheterization was performed on January 05 2011 that showed 95% proximal LAD disease. A bare metal stent was placed ( 3.5 x 18 mm vision bare metal stent).  Since then, his breathing has been much better. He is able to walk to the mailbox without having to stop for  a breathing treatment.   he is s/p back surgery with profound anemia noted after the surgery. He was admitted to the hospital after routine blood work showed a HCT of 8. In the ER was noted to have hypotension. He had a transfusion, EGD, colonoscopy.   CT of the ABD did not show a bleed. Her is now back on ASA and plavix. He has received an iron infusion.  Otherwise he has no complaints. He reports that he had leg edema a few weeks ago. This has essentially resolved. No significant SOB or CP.    EKG today shows sinus rhythm with rate 59 beats per minute with ST-wave abnormality in V5 through V6       Outpatient Encounter Prescriptions as of 05/26/2011  Medication Sig Dispense Refill  . aspirin 325 MG EC tablet Take 325 mg by mouth daily.      [name] atorvastatin (LIPITOR) 20 MG tablet Take 1 tablet (20 mg total) by mouth daily.  30 tablet  6  . carvedilol (COREG) 12.5 MG tablet Take 12.5 mg by mouth 2 (two) times daily with a meal.      . clopidogrel (PLAVIX) 75 MG tablet Take 1 tablet (75 mg total) by mouth daily.  30 tablet  11  . furosemide (LASIX) 40 MG tablet Take 40 mg by mouth  daily as needed.       . methocarbamol (ROBAXIN) 500 MG tablet Take 1 tablet (500 mg total) by mouth 2 (two) times daily.  180 tablet  3  . omeprazole (PRILOSEC) 40 MG capsule Take 1 capsule (40 mg total) by mouth daily.  90 capsule  3  . phenazopyridine (PYRIDIUM) 200 MG tablet Take 200 mg by mouth 3 (three) times daily as needed.        . polyethylene glycol powder (GLYCOLAX/MIRALAX) powder Take 17 g by mouth as needed.       . potassium chloride SA (K-DUR,KLOR-CON) 20 MEQ tablet Take 20 mEq by mouth daily as needed.       . Tamsulosin HCl (FLOMAX) 0.4 MG CAPS Take 0.4 mg by mouth daily.        [name] tiotropium (SPIRIVA HANDIHALER) 18 MCG inhalation capsule Place 1 capsule (18 mcg total) into inhaler and inhale daily.  30 capsule  2  . budesonide-formoterol (SYMBICORT) 160-4.5 MCG/ACT inhaler Inhale 2 puffs into the lungs 2 (two) times daily.  1 Inhaler  12  . Fluticasone-Salmeterol (ADVAIR DISKUS) 250-50 MCG/DOSE AEPB Inhale 1 puff into the lungs 2 (two) times daily.  14 each  11  . traMADol (ULTRAM)  50 MG tablet Take 1 tablet (50 mg total) by mouth 4 (four) times daily as needed.  120 tablet  3     Review of Systems  Constitutional: Negative.   HENT: Negative.   Eyes: Negative.   Cardiovascular: Negative.   Gastrointestinal: Negative.   Skin: Negative.   Neurological: Negative.   Hematological: Negative.   Psychiatric/Behavioral: Negative.   All other systems reviewed and are negative.    BP 108/73  Pulse 59  Wt 200 lb (90.719 kg)   Physical Exam  Nursing note and vitals reviewed. Constitutional: He is oriented to person, place, and time. He appears well-developed and well-nourished.  HENT:  Head: Normocephalic.  Nose: Nose normal.  Mouth/Throat: Oropharynx is clear and moist.  Eyes: Conjunctivae are normal. Pupils are equal, round, and reactive to light.  Neck: Normal range of motion. Neck supple. No JVD present.  Cardiovascular: Normal rate, regular rhythm, S1 normal, S2  normal, normal heart sounds and intact distal pulses.  Exam reveals no gallop and no friction rub.   No murmur heard. Pulmonary/Chest: Effort normal. No respiratory distress. He has decreased breath sounds. He has no wheezes. He has no rales. He exhibits no tenderness.  Abdominal: Soft. Bowel sounds are normal. He exhibits no distension. There is no tenderness.  Musculoskeletal: Normal range of motion. He exhibits no edema and no tenderness.  Lymphadenopathy:    He has no cervical adenopathy.  Neurological: He is alert and oriented to person, place, and time. Coordination normal.  Skin: Skin is warm and dry. No rash noted. No erythema.  Psychiatric: He has a normal mood and affect. His behavior is normal. Judgment and thought content normal.           Assessment and Plan

## 2011-05-30 DIAGNOSIS — M549 Dorsalgia, unspecified: Secondary | ICD-10-CM | POA: Diagnosis not present

## 2011-06-01 DIAGNOSIS — M549 Dorsalgia, unspecified: Secondary | ICD-10-CM | POA: Diagnosis not present

## 2011-06-06 DIAGNOSIS — M549 Dorsalgia, unspecified: Secondary | ICD-10-CM | POA: Diagnosis not present

## 2011-06-08 DIAGNOSIS — M549 Dorsalgia, unspecified: Secondary | ICD-10-CM | POA: Diagnosis not present

## 2011-06-13 DIAGNOSIS — M549 Dorsalgia, unspecified: Secondary | ICD-10-CM | POA: Diagnosis not present

## 2011-06-15 DIAGNOSIS — M549 Dorsalgia, unspecified: Secondary | ICD-10-CM | POA: Diagnosis not present

## 2011-06-16 ENCOUNTER — Ambulatory Visit: Payer: Self-pay | Admitting: Oncology

## 2011-06-27 ENCOUNTER — Ambulatory Visit (INDEPENDENT_AMBULATORY_CARE_PROVIDER_SITE_OTHER): Payer: Medicare Other | Admitting: Internal Medicine

## 2011-06-27 ENCOUNTER — Encounter: Payer: Self-pay | Admitting: Internal Medicine

## 2011-06-27 VITALS — BP 94/48 | HR 68 | Temp 98.7°F | Resp 16 | Wt 201.8 lb

## 2011-06-27 DIAGNOSIS — J9611 Chronic respiratory failure with hypoxia: Secondary | ICD-10-CM

## 2011-06-27 DIAGNOSIS — F17200 Nicotine dependence, unspecified, uncomplicated: Secondary | ICD-10-CM

## 2011-06-27 DIAGNOSIS — J9610 Chronic respiratory failure, unspecified whether with hypoxia or hypercapnia: Secondary | ICD-10-CM

## 2011-06-27 DIAGNOSIS — Z720 Tobacco use: Secondary | ICD-10-CM

## 2011-06-27 NOTE — Progress Notes (Signed)
Subjective:    Patient ID: [name], male    DOB: Jun 19, 1945, 66 y.o.   MRN: [id number]  HPI  Mr. Rocks presents with mild symptoms of URi after coming into contact with his wife who has been suffering for a week from sinusitis.  He denies fevers, myalgias,  and dyspnea.  Occasional cough,but feels generally at baseline,  Has maintained tobacco abstinence by sucking on lollipops.  Back pain improved and surgical follow up with Dr. Channing Mutters was fine.   Past Medical History  Diagnosis Date  . Coronary artery disease   . COPD (chronic obstructive pulmonary disease)   . Hypertension   . Hyperlipidemia   . History of kidney stones   . History of prostate cancer   . MI (myocardial infarction) 03/2001  . Neuromuscular disorder    Current Outpatient Prescriptions on File Prior to Visit  Medication Sig Dispense Refill  . aspirin 81 MG tablet Take 1 tablet (81 mg total) by mouth daily.  30 tablet    . atorvastatin (LIPITOR) 20 MG tablet Take 1 tablet (20 mg total) by mouth daily.  30 tablet  6  . budesonide-formoterol (SYMBICORT) 160-4.5 MCG/ACT inhaler Inhale 2 puffs into the lungs 2 (two) times daily.  1 Inhaler  12  . carvedilol (COREG) 12.5 MG tablet Take 12.5 mg by mouth 2 (two) times daily with a meal.      . clopidogrel (PLAVIX) 75 MG tablet Take 1 tablet (75 mg total) by mouth daily.  30 tablet  11  . Fluticasone-Salmeterol (ADVAIR DISKUS) 250-50 MCG/DOSE AEPB Inhale 1 puff into the lungs 2 (two) times daily.  14 each  11  . furosemide (LASIX) 40 MG tablet Take 40 mg by mouth daily as needed.       . methocarbamol (ROBAXIN) 500 MG tablet Take 1 tablet (500 mg total) by mouth 2 (two) times daily.  180 tablet  3  . omeprazole (PRILOSEC) 40 MG capsule Take 1 capsule (40 mg total) by mouth daily.  90 capsule  3  . phenazopyridine (PYRIDIUM) 200 MG tablet Take 200 mg by mouth 3 (three) times daily as needed.        . polyethylene glycol powder (GLYCOLAX/MIRALAX) powder Take 17 g by mouth as  needed.       . potassium chloride SA (K-DUR,KLOR-CON) 20 MEQ tablet Take 20 mEq by mouth daily as needed.       . Tamsulosin HCl (FLOMAX) 0.4 MG CAPS Take 0.4 mg by mouth daily.        [name] tiotropium (SPIRIVA HANDIHALER) 18 MCG inhalation capsule Place 1 capsule (18 mcg total) into inhaler and inhale daily.  30 capsule  2  . traMADol (ULTRAM) 50 MG tablet Take 1 tablet (50 mg total) by mouth 4 (four) times daily as needed.  120 tablet  3   Review of Systems  Constitutional: Negative for fever, chills, diaphoresis, activity change, appetite change, fatigue and unexpected weight change.  HENT: Negative for hearing loss, ear pain, nosebleeds, congestion, sore throat, facial swelling, rhinorrhea, sneezing, drooling, mouth sores, trouble swallowing, neck pain, neck stiffness, dental problem, voice change, postnasal drip, sinus pressure, tinnitus and ear discharge.   Eyes: Negative for photophobia, pain, discharge, redness, itching and visual disturbance.  Respiratory: Negative for apnea, cough, choking, chest tightness, shortness of breath, wheezing and stridor.   Cardiovascular: Negative for chest pain, palpitations and leg swelling.  Gastrointestinal: Negative for nausea, vomiting, abdominal pain, diarrhea, constipation, blood in stool, abdominal distention, anal  bleeding and rectal pain.  Genitourinary: Negative for dysuria, urgency, frequency, hematuria, flank pain, decreased urine volume, scrotal swelling, difficulty urinating and testicular pain.  Musculoskeletal: Negative for myalgias, back pain, joint swelling, arthralgias and gait problem.  Skin: Negative for color change, rash and wound.  Neurological: Negative for dizziness, tremors, seizures, syncope, speech difficulty, weakness, light-headedness, numbness and headaches.  Psychiatric/Behavioral: Negative for suicidal ideas, hallucinations, behavioral problems, confusion, sleep disturbance, dysphoric mood, decreased concentration and agitation.  The patient is not nervous/anxious.        Objective:   Physical Exam  Constitutional: He is oriented to person, place, and time.  HENT:  Head: Normocephalic and atraumatic.  Mouth/Throat: Oropharynx is clear and moist.  Eyes: Conjunctivae and EOM are normal.  Neck: Normal range of motion. Neck supple. No JVD present. No thyromegaly present.  Cardiovascular: Normal rate, regular rhythm and normal heart sounds.   Pulmonary/Chest: Effort normal and breath sounds normal. He has no wheezes. He has no rales.  Abdominal: Soft. Bowel sounds are normal. He exhibits no mass. There is no tenderness. There is no rebound.  Musculoskeletal: Normal range of motion. He exhibits no edema.  Neurological: He is alert and oriented to person, place, and time.  Skin: Skin is warm and dry.  Psychiatric: He has a normal mood and affect.      Assessment & Plan:   Chronic respiratory failure with hypoxia His ambulatory sats arre 91 % but he is not dyspneic and has a normal lung exam.  No changes to COPD regimen.   Tobacco abuse He has been abstinent for over one month since his cardiac catheterization .      Updated Medication List Outpatient Encounter Prescriptions as of 06/27/2011  Medication Sig Dispense Refill  . aspirin 81 MG tablet Take 1 tablet (81 mg total) by mouth daily.  30 tablet    . atorvastatin (LIPITOR) 20 MG tablet Take 1 tablet (20 mg total) by mouth daily.  30 tablet  6  . budesonide-formoterol (SYMBICORT) 160-4.5 MCG/ACT inhaler Inhale 2 puffs into the lungs 2 (two) times daily.  1 Inhaler  12  . carvedilol (COREG) 12.5 MG tablet Take 12.5 mg by mouth 2 (two) times daily with a meal.      . clopidogrel (PLAVIX) 75 MG tablet Take 1 tablet (75 mg total) by mouth daily.  30 tablet  11  . Fluticasone-Salmeterol (ADVAIR DISKUS) 250-50 MCG/DOSE AEPB Inhale 1 puff into the lungs 2 (two) times daily.  14 each  11  . furosemide (LASIX) 40 MG tablet Take 40 mg by mouth daily as needed.         . methocarbamol (ROBAXIN) 500 MG tablet Take 1 tablet (500 mg total) by mouth 2 (two) times daily.  180 tablet  3  . omeprazole (PRILOSEC) 40 MG capsule Take 1 capsule (40 mg total) by mouth daily.  90 capsule  3  . phenazopyridine (PYRIDIUM) 200 MG tablet Take 200 mg by mouth 3 (three) times daily as needed.        . polyethylene glycol powder (GLYCOLAX/MIRALAX) powder Take 17 g by mouth as needed.       . potassium chloride SA (K-DUR,KLOR-CON) 20 MEQ tablet Take 20 mEq by mouth daily as needed.       . Tamsulosin HCl (FLOMAX) 0.4 MG CAPS Take 0.4 mg by mouth daily.        [name] tiotropium (SPIRIVA HANDIHALER) 18 MCG inhalation capsule Place 1 capsule (18 mcg total) into inhaler and  inhale daily.  30 capsule  2  . traMADol (ULTRAM) 50 MG tablet Take 1 tablet (50 mg total) by mouth 4 (four) times daily as needed.  120 tablet  3

## 2011-06-27 NOTE — Assessment & Plan Note (Addendum)
His ambulatory sats arre 91 % but he is not dyspneic and has a normal lung exam.  No changes to COPD regimen.

## 2011-06-27 NOTE — Assessment & Plan Note (Signed)
He has been abstinent for over one month since his cardiac catheterization .

## 2011-07-03 DIAGNOSIS — M19039 Primary osteoarthritis, unspecified wrist: Secondary | ICD-10-CM | POA: Diagnosis not present

## 2011-07-04 DIAGNOSIS — M25549 Pain in joints of unspecified hand: Secondary | ICD-10-CM | POA: Diagnosis not present

## 2011-08-18 ENCOUNTER — Ambulatory Visit: Payer: Self-pay | Admitting: Oncology

## 2011-08-18 DIAGNOSIS — Z79899 Other long term (current) drug therapy: Secondary | ICD-10-CM | POA: Diagnosis not present

## 2011-08-18 DIAGNOSIS — D509 Iron deficiency anemia, unspecified: Secondary | ICD-10-CM | POA: Diagnosis not present

## 2011-08-18 DIAGNOSIS — J449 Chronic obstructive pulmonary disease, unspecified: Secondary | ICD-10-CM | POA: Diagnosis not present

## 2011-08-18 DIAGNOSIS — Z7982 Long term (current) use of aspirin: Secondary | ICD-10-CM | POA: Diagnosis not present

## 2011-08-18 DIAGNOSIS — I252 Old myocardial infarction: Secondary | ICD-10-CM | POA: Diagnosis not present

## 2011-08-18 DIAGNOSIS — Z950 Presence of cardiac pacemaker: Secondary | ICD-10-CM | POA: Diagnosis not present

## 2011-08-18 LAB — CBC CANCER CENTER
Basophil %: 0.9 %
Eosinophil #: 0.3 x10 3/mm (ref 0.0–0.7)
HGB: 11.9 g/dL — ABNORMAL LOW (ref 13.0–18.0)
Lymphocyte %: 22.8 %
MCH: 31.6 pg (ref 26.0–34.0)
Monocyte %: 7.2 %
Neutrophil #: 4.3 x10 3/mm (ref 1.4–6.5)
Neutrophil %: 64.2 %
RBC: 3.75 10*6/uL — ABNORMAL LOW (ref 4.40–5.90)
RDW: 12.6 % (ref 11.5–14.5)

## 2011-08-18 LAB — FERRITIN: Ferritin (ARMC): 362 ng/mL (ref 8–388)

## 2011-08-18 LAB — IRON AND TIBC
Iron Bind.Cap.(Total): 217 ug/dL — ABNORMAL LOW (ref 250–450)
Iron Saturation: 32 %

## 2011-09-04 ENCOUNTER — Encounter: Payer: Self-pay | Admitting: Internal Medicine

## 2011-09-04 ENCOUNTER — Ambulatory Visit (INDEPENDENT_AMBULATORY_CARE_PROVIDER_SITE_OTHER): Payer: Medicare Other | Admitting: Internal Medicine

## 2011-09-04 VITALS — BP 110/68 | HR 66 | Temp 98.8°F | Resp 16 | Ht 70.0 in | Wt 196.5 lb

## 2011-09-04 DIAGNOSIS — J029 Acute pharyngitis, unspecified: Secondary | ICD-10-CM

## 2011-09-04 DIAGNOSIS — R21 Rash and other nonspecific skin eruption: Secondary | ICD-10-CM

## 2011-09-04 DIAGNOSIS — L02519 Cutaneous abscess of unspecified hand: Secondary | ICD-10-CM

## 2011-09-04 DIAGNOSIS — L03119 Cellulitis of unspecified part of limb: Secondary | ICD-10-CM | POA: Diagnosis not present

## 2011-09-04 DIAGNOSIS — J069 Acute upper respiratory infection, unspecified: Secondary | ICD-10-CM

## 2011-09-04 MED ORDER — DOXYCYCLINE HYCLATE 100 MG PO TABS: 100.0000 mg | ORAL_TABLET | Freq: Two times a day (BID) | ORAL | Status: AC

## 2011-09-04 MED ORDER — NYSTATIN 100000 UNIT/GM EX OINT: TOPICAL_OINTMENT | Freq: Two times a day (BID) | CUTANEOUS | Status: DC

## 2011-09-04 MED ORDER — GUAIFENESIN-CODEINE 100-10 MG/5ML PO SYRP: 5.0000 mL | ORAL_SOLUTION | Freq: Three times a day (TID) | ORAL | Status: AC | PRN

## 2011-09-04 NOTE — Progress Notes (Signed)
Patient ID: [name], male   DOB: [birth year], 66 y.o.   MRN: [id number] Patient Active Problem List  Diagnoses  . COLONIC POLYPS  . HYPERLIPIDEMIA  . HYPERTENSION  . CAD, NATIVE VESSEL  . GERD  . Preoperative cardiovascular examination  . Tobacco abuse  . Tobacco abuse counseling  . Anemia associated with acute blood loss  . Chronic respiratory failure with hypoxia  . Gastroenteritis/colitis, infectious  . Cellulitis and abscess of hand  . Acute URI    Subjective:  CC:   Chief Complaint  Patient presents with  . infection ? left wrist  . Cough    productive cough with yellow phlegm  . Sore Throat    HPI:   [name] a 66 y.o. male who presents with  2 separate acute issues.  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  He first noticed blisters which burst and then bled .  Has been treating the areas with hydrogen peroxide several times daily and salt water.  Intensely pruritic. Second issue is cough and sore throat which started yesterday evening, accompanied by congested sinuses.  Positive sick contacts.   Yellow drainage.     Past Medical History  Diagnosis Date  . Coronary artery disease   . COPD (chronic obstructive pulmonary disease)   . Hypertension   . Hyperlipidemia   . History of kidney stones   . History of prostate cancer   . MI (myocardial infarction) 03/2001  . Neuromuscular disorder     Past Surgical History  Procedure Date  . Prostate surgery 09/2009    prostate implant  . Back surgery 2009/2010    x 2  . Hernia repair     bilateral   . Foot surgery     bilateral   . Knee surgery     right knee   . Cardiac catheterization 2002    stent placement [place]  . Spine surgery          The following portions of the patient's history  were reviewed and updated as appropriate: Allergies, current medications, and problem list.    Review of Systems:   12 Pt  review of systems was negative except those addressed in the HPI,     History   Social History  . Marital Status: Married    Spouse Name: N/A    Number of Children: N/A  . Years of Education: N/A   Occupational History  . Not on file.   Social History Main Topics  . Smoking status: Former Smoker -- 1.0 packs/day    Types: Cigarettes    Quit date: 02/10/2011  . Smokeless tobacco: Never Used  . Alcohol Use: No  . Drug Use: No  . Sexually Active: Not on file   Other Topics Concern  . Not on file   Social History Narrative  . No narrative on file    Objective:  BP 110/68  Pulse 66  Temp(Src) 98.8 F (37.1 C) (Oral)  Resp 16  Ht 5\' 10"  (1.778 m)  Wt 196 lb 8 oz (89.132 kg)  BMI 28.19 kg/m2  SpO2 91%  General appearance: alert, cooperative and appears stated age Ears: normal TM's and external ear canals both ears Throat: lips, mucosa, and tongue normal; teeth and gums normal Neck: no  adenopathy, no carotid bruit, supple, symmetrical, trachea midline and thyroid not enlarged, symmetric, no tenderness/mass/nodules Back: symmetric, no curvature. ROM normal. No CVA tenderness. Lungs: clear to auscultation bilaterally Heart: regular rate and rhythm, S1, S2 normal, no murmur, click, rub or gallop Abdomen: soft, non-tender; bowel sounds normal; no masses,  no organomegaly Pulses: 2+ and symmetric Skin: Skin color, texture, turgor normal. No rashes or lesions Lymph nodes: Cervical, supraclavicular, and axillary nodes normal.  Assessment and Plan:  Cellulitis and abscess of hand Superficial ulcers which are pruritic due to persistent wet state.,  Will cover topically with nystatin for fungal,  And add doxycycline to cover MRSA empirically. Wound was covered with telfa nonstick pad and wrapped in kerlex.   Acute URI No wheezing on exam. Strep  test was negative.  Doxycycline and cheratussin. Oral decongestants and zyrtec.     Updated Medication List Outpatient Encounter Prescriptions as of 09/04/2011  Medication Sig Dispense Refill  . aspirin 81 MG tablet Take 1 tablet (81 mg total) by mouth daily.  30 tablet    . atorvastatin (LIPITOR) 20 MG tablet Take 1 tablet (20 mg total) by mouth daily.  30 tablet  6  . carvedilol (COREG) 12.5 MG tablet Take 6.25 mg by mouth 2 (two) times daily with a meal.       . clopidogrel (PLAVIX) 75 MG tablet Take 1 tablet (75 mg total) by mouth daily.  30 tablet  11  . methocarbamol (ROBAXIN) 500 MG tablet Take 1 tablet (500 mg total) by mouth 2 (two) times daily.  180 tablet  3  . omeprazole (PRILOSEC) 40 MG capsule Take 1 capsule (40 mg total) by mouth daily.  90 capsule  3  . phenazopyridine (PYRIDIUM) 200 MG tablet Take 200 mg by mouth 3 (three) times daily as needed.        . potassium chloride SA (K-DUR,KLOR-CON) 20 MEQ tablet Take 20 mEq by mouth daily as needed.       . Tamsulosin HCl (FLOMAX) 0.4 MG CAPS Take 0.4 mg by mouth daily.        . traMADol (ULTRAM) 50 MG tablet Take 1 tablet (50 mg total) by mouth 4 (four) times daily as needed.  120 tablet  3  . budesonide-formoterol (SYMBICORT) 160-4.5 MCG/ACT inhaler Inhale 2 puffs into the lungs 2 (two) times daily.  1 Inhaler  12  . doxycycline (VIBRA-TABS) 100 MG tablet Take 1 tablet (100 mg total) by mouth 2 (two) times daily.  28 tablet  0  . Fluticasone-Salmeterol (ADVAIR DISKUS) 250-50 MCG/DOSE AEPB Inhale 1 puff into the lungs 2 (two) times daily.  14 each  11  . furosemide (LASIX) 40 MG tablet Take 40 mg by mouth daily as needed.       [name] guaiFENesin-codeine (ROBITUSSIN AC) 100-10 MG/5ML syrup Take 5 mLs by mouth 3 (three) times daily as needed for cough.  240 mL  0  . nystatin ointment (MYCOSTATIN) Apply topically 2 (two) times daily.  30 g  0  . polyethylene glycol powder (GLYCOLAX/MIRALAX) powder Take 17 g by mouth as needed.       .  tiotropium (SPIRIVA HANDIHALER) 18 MCG inhalation capsule Place 1 capsule (18 mcg total) into inhaler and inhale daily.  30 capsule  2     Orders Placed This Encounter  Procedures  . POCT rapid strep A    No Follow-up on file.

## 2011-09-04 NOTE — Assessment & Plan Note (Signed)
No wheezing on exam.  Doxycycline and cheratussin. Oral decongestants and zyrtec.

## 2011-09-04 NOTE — Patient Instructions (Signed)
Consider the Low Glycemic Index Diet and 6 smaller meals daily .  This boosts your metabolism and regulates your sugars:   7 AM Low carbohydrate Protein  Shakes (EAS Carb Control  Or Atkins ,  Available everywhere,   In  cases at [employer] )  2.5 carbs  (Add or substitute a toasted sandwhich thin w/ peanut butter)  10 AM: Protein bar by Atkins (snack size,  Chocolate lover's variety at  [employer])    Lunch: sandwich on pita bread or flatbread (Joseph's makes a pita bread and a flat bread , available at [employer] and [employer]; Toufayah makes a low carb flatbread available at [employer] and [employer]) Mission makes a low carb whole wheat tortilla available at [employer] most grocery stores   3 PM:  Mid day :  Another protein bar,  Or a  cheese stick, 1/4 cup of almonds, walnuts, pistachios, pecans, peanuts,  Macadamia nuts  6 PM  Dinner:  "mean and green:"  Meat/chicken/fish, salad, and green veggie : use ranch, vinagrette,  Blue cheese, etc  9 PM snack : Breyer's low carb fudgsicle or  ice cream bar (Carb Smart), or  Weight Watcher's ice cream bar , or another protein shake   I am prescribing nystatin ointment to use twice daily underneath your gauze dressing,  And doxycycline for both of you for the respiratory symptoms.   Along with cough syrup with codeine.

## 2011-09-04 NOTE — Assessment & Plan Note (Addendum)
Superficial ulcers which are pruritic due to persistent wet state.,  Will cover topically with nystatin for fungal,  And add doxycycline to cover MRSA empirically. Wound was covered with telfa nonstick pad and wrapped in kerlex.

## 2011-09-15 ENCOUNTER — Ambulatory Visit (INDEPENDENT_AMBULATORY_CARE_PROVIDER_SITE_OTHER): Payer: Medicare Other | Admitting: Internal Medicine

## 2011-09-15 ENCOUNTER — Encounter: Payer: Self-pay | Admitting: Internal Medicine

## 2011-09-15 VITALS — BP 106/66 | HR 62 | Temp 98.4°F | Resp 18 | Wt 194.8 lb

## 2011-09-15 DIAGNOSIS — J40 Bronchitis, not specified as acute or chronic: Secondary | ICD-10-CM | POA: Diagnosis not present

## 2011-09-15 DIAGNOSIS — L259 Unspecified contact dermatitis, unspecified cause: Secondary | ICD-10-CM

## 2011-09-15 DIAGNOSIS — L309 Dermatitis, unspecified: Secondary | ICD-10-CM

## 2011-09-15 MED ORDER — TRIAMCINOLONE ACETONIDE 0.1 % EX CREA: TOPICAL_CREAM | Freq: Two times a day (BID) | CUTANEOUS | Status: DC

## 2011-09-15 MED ORDER — AMOXICILLIN-POT CLAVULANATE 875-125 MG PO TABS: 1.0000 | ORAL_TABLET | Freq: Two times a day (BID) | ORAL | Status: AC

## 2011-09-15 MED ORDER — ALBUTEROL SULFATE HFA 108 (90 BASE) MCG/ACT IN AERS: 2.0000 | INHALATION_SPRAY | Freq: Four times a day (QID) | RESPIRATORY_TRACT | Status: DC | PRN

## 2011-09-15 NOTE — Progress Notes (Signed)
Subjective:    Patient ID: [name], male    DOB: 08/30/1945, 66 y.o.   MRN: [id number]  HPI 66 year old male with history of tobacco abuse, CAD, COPD, and recent upper respiratory infection presents for acute visit complaining of worsening cough, shortness of breath, and fatigue. He was recently seen in our clinic and treated with doxycycline. He reports no improvement with this. Notably, he had stopped using his inhaled bronchodilators and inhaled steroids. Over the last week, he has noticed worsening shortness of breath, wheezing, and cough productive of purulent sputum. He denies any chest pain. He denies fever or chills.  He is also concerned about a rash on his left hand. He has been wearing a brace on his hand to help with instability and weakness in his thumb. Since wearing a brace he has developed a red itching rash over his proximal thumb. This was initially thought to represent cellulitis and he was treated with doxycycline. He reports some improvement with this treatment. He denies any fever or chills. Over the last week, he has also been applying Benadryl cream to the area with no improvement.  Outpatient Encounter Prescriptions as of 09/15/2011  Medication Sig Dispense Refill  . aspirin 81 MG tablet Take 1 tablet (81 mg total) by mouth daily.  30 tablet    . atorvastatin (LIPITOR) 20 MG tablet Take 1 tablet (20 mg total) by mouth daily.  30 tablet  6  . budesonide-formoterol (SYMBICORT) 160-4.5 MCG/ACT inhaler Inhale 2 puffs into the lungs 2 (two) times daily.  1 Inhaler  12  . carvedilol (COREG) 12.5 MG tablet Take 6.25 mg by mouth 2 (two) times daily with a meal.       . clopidogrel (PLAVIX) 75 MG tablet Take 1 tablet (75 mg total) by mouth daily.  30 tablet  11  . Fluticasone-Salmeterol (ADVAIR DISKUS) 250-50 MCG/DOSE AEPB Inhale 1 puff into the lungs 2 (two) times daily.  14 each  11  . furosemide (LASIX) 40 MG tablet Take 40 mg by mouth daily as needed.       . methocarbamol  (ROBAXIN) 500 MG tablet Take 1 tablet (500 mg total) by mouth 2 (two) times daily.  180 tablet  3  . omeprazole (PRILOSEC) 40 MG capsule Take 1 capsule (40 mg total) by mouth daily.  90 capsule  3  . phenazopyridine (PYRIDIUM) 200 MG tablet Take 200 mg by mouth 3 (three) times daily as needed.        . polyethylene glycol powder (GLYCOLAX/MIRALAX) powder Take 17 g by mouth as needed.       . potassium chloride SA (K-DUR,KLOR-CON) 20 MEQ tablet Take 20 mEq by mouth daily as needed.       . Tamsulosin HCl (FLOMAX) 0.4 MG CAPS Take 0.4 mg by mouth daily.        [name] tiotropium (SPIRIVA HANDIHALER) 18 MCG inhalation capsule Place 1 capsule (18 mcg total) into inhaler and inhale daily.  30 capsule  2  . traMADol (ULTRAM) 50 MG tablet Take 1 tablet (50 mg total) by mouth 4 (four) times daily as needed.  120 tablet  3  . DISCONTD: nystatin ointment (MYCOSTATIN) Apply topically 2 (two) times daily.  30 g  0  . albuterol (PROVENTIL HFA;VENTOLIN HFA) 108 (90 BASE) MCG/ACT inhaler Inhale 2 puffs into the lungs every 6 (six) hours as needed for wheezing.  1 Inhaler  0  . amoxicillin-clavulanate (AUGMENTIN) 875-125 MG per tablet Take 1 tablet by mouth 2 (  two) times daily.  20 tablet  0  . doxycycline (VIBRA-TABS) 100 MG tablet Take 1 tablet (100 mg total) by mouth 2 (two) times daily.  28 tablet  0  . guaiFENesin-codeine (ROBITUSSIN AC) 100-10 MG/5ML syrup Take 5 mLs by mouth 3 (three) times daily as needed for cough.  240 mL  0  . triamcinolone cream (KENALOG) 0.1 % Apply topically 2 (two) times daily.  30 g  0   BP 106/66  Pulse 62  Temp(Src) 98.4 F (36.9 C) (Oral)  Resp 18  Wt 194 lb 12 oz (88.338 kg)  SpO2 90%  Review of Systems  Constitutional: Positive for fatigue. Negative for fever, chills and activity change.  HENT: Positive for congestion, rhinorrhea and postnasal drip. Negative for hearing loss, ear pain, nosebleeds, sore throat, sneezing, trouble swallowing, neck pain, neck stiffness, voice  change, sinus pressure, tinnitus and ear discharge.   Eyes: Negative for discharge, redness, itching and visual disturbance.  Respiratory: Positive for cough, chest tightness, shortness of breath and wheezing. Negative for stridor.   Cardiovascular: Negative for chest pain and leg swelling.  Musculoskeletal: Negative for myalgias and arthralgias.  Skin: Positive for color change and rash.  Neurological: Negative for dizziness, facial asymmetry and headaches.  Psychiatric/Behavioral: Negative for sleep disturbance.       Objective:   Physical Exam  Constitutional: He is oriented to person, place, and time. He appears well-developed and well-nourished. No distress.  HENT:  Head: Normocephalic and atraumatic.  Right Ear: External ear normal.  Left Ear: External ear normal.  Nose: Nose normal.  Mouth/Throat: Oropharynx is clear and moist. No oropharyngeal exudate.  Eyes: Conjunctivae and EOM are normal. Pupils are equal, round, and reactive to light. Right eye exhibits no discharge. Left eye exhibits no discharge. No scleral icterus.  Neck: Normal range of motion. Neck supple. No tracheal deviation present. No thyromegaly present.  Cardiovascular: Normal rate, regular rhythm and normal heart sounds.  Exam reveals no gallop and no friction rub.   No murmur heard. Pulmonary/Chest: Effort normal. No accessory muscle usage. Not tachypneic. No respiratory distress. He has decreased breath sounds (markedly improved after alb/atrovent neb). He has wheezes. He has no rhonchi. He has no rales. He exhibits no tenderness.  Musculoskeletal: Normal range of motion. He exhibits no edema.  Lymphadenopathy:    He has no cervical adenopathy.  Neurological: He is alert and oriented to person, place, and time. No cranial nerve deficit. Coordination normal.  Skin: Skin is warm and dry. Rash noted. Rash is papular. He is not diaphoretic. There is erythema. No pallor.     Psychiatric: He has a normal mood and  affect. His behavior is normal. Judgment and thought content normal.          Assessment & Plan:

## 2011-09-15 NOTE — Assessment & Plan Note (Signed)
Symptoms and exam are most consistent with acute bronchitis. Exam is markedly improved after albuterol and Atrovent nebulizer in clinic. Encouraged patient to restart Advair, Spiriva, and Ventolin. Samples were given today. Will also start Levaquin. Would like to use prednisone, however patient is intolerant to this. We'll be aggressive with inhaled bronchodilators and steroids. Patient will call or e-mail if no improvement over the weekend. If no improvement, would favor getting chest x-ray and possibly trying low-dose of prednisone to help with bronchospasm.

## 2011-09-15 NOTE — Assessment & Plan Note (Signed)
Symptoms and exam are most consistent with dermatitis secondary to contact with place. There is no evidence of infection today. Patient will apply topical triamcinolone cream twice daily over the next week. He will call or return to clinic if symptoms are worsening or if they are not improving. He will avoid the use of his brace given potential allergy.

## 2011-09-16 ENCOUNTER — Ambulatory Visit: Payer: Self-pay | Admitting: Oncology

## 2011-09-18 ENCOUNTER — Encounter: Payer: Self-pay | Admitting: Internal Medicine

## 2011-09-18 ENCOUNTER — Ambulatory Visit (INDEPENDENT_AMBULATORY_CARE_PROVIDER_SITE_OTHER): Payer: Medicare Other | Admitting: Internal Medicine

## 2011-09-18 VITALS — BP 110/60 | HR 70 | Temp 98.5°F | Ht 70.0 in | Wt 195.0 lb

## 2011-09-18 DIAGNOSIS — E039 Hypothyroidism, unspecified: Secondary | ICD-10-CM

## 2011-09-18 DIAGNOSIS — D510 Vitamin B12 deficiency anemia due to intrinsic factor deficiency: Secondary | ICD-10-CM | POA: Diagnosis not present

## 2011-09-18 DIAGNOSIS — J40 Bronchitis, not specified as acute or chronic: Secondary | ICD-10-CM

## 2011-09-18 DIAGNOSIS — R252 Cramp and spasm: Secondary | ICD-10-CM | POA: Diagnosis not present

## 2011-09-18 DIAGNOSIS — L309 Dermatitis, unspecified: Secondary | ICD-10-CM

## 2011-09-18 DIAGNOSIS — L259 Unspecified contact dermatitis, unspecified cause: Secondary | ICD-10-CM

## 2011-09-18 LAB — CBC WITH DIFFERENTIAL/PLATELET
Basophils Relative: 0.3 % (ref 0.0–3.0)
Eosinophils Absolute: 0.4 10*3/uL (ref 0.0–0.7)
Eosinophils Relative: 4.2 % (ref 0.0–5.0)
Hemoglobin: 10.9 g/dL — ABNORMAL LOW (ref 13.0–17.0)
Lymphocytes Relative: 15.6 % (ref 12.0–46.0)
MCHC: 32.3 g/dL (ref 30.0–36.0)
Neutro Abs: 6.5 10*3/uL (ref 1.4–7.7)
Neutrophils Relative %: 74 % (ref 43.0–77.0)
RBC: 3.53 Mil/uL — ABNORMAL LOW (ref 4.22–5.81)
WBC: 8.9 10*3/uL (ref 4.5–10.5)

## 2011-09-18 LAB — COMPREHENSIVE METABOLIC PANEL
AST: 18 U/L (ref 0–37)
Albumin: 3.5 g/dL (ref 3.5–5.2)
Alkaline Phosphatase: 91 U/L (ref 39–117)
BUN: 13 mg/dL (ref 6–23)
Calcium: 8.8 mg/dL (ref 8.4–10.5)
Creatinine, Ser: 1 mg/dL (ref 0.4–1.5)
Glucose, Bld: 116 mg/dL — ABNORMAL HIGH (ref 70–99)

## 2011-09-18 LAB — MAGNESIUM: Magnesium: 1.8 mg/dL (ref 1.5–2.5)

## 2011-09-18 LAB — TSH: TSH: 1.93 u[IU]/mL (ref 0.35–5.50)

## 2011-09-18 NOTE — Progress Notes (Signed)
Subjective:    Patient ID: [name], male    DOB: 1945-11-29, 66 y.o.   MRN: [id number]  HPI 66YO male with history of COPD presents for follow up of recent episode of bronchitis and dermatitis. Symptoms of cough and shortness of breath are much improved after resuming inhalers over the weekend. Pt has also been taking Augmentin. He denies any problems with this medication.  In regards to dermatitis of his left hand, he has been applying triamcinolone with improvement in rash and itching.  He is concerned today about muscle cramping. This first started over the weekend. It is described as diffuse. He has been taking Tramadol and Robaxin with no improvement.  Outpatient Encounter Prescriptions as of 09/18/2011  Medication Sig Dispense Refill  . albuterol (PROVENTIL HFA;VENTOLIN HFA) 108 (90 BASE) MCG/ACT inhaler Inhale 2 puffs into the lungs every 6 (six) hours as needed for wheezing.  1 Inhaler  0  . amoxicillin-clavulanate (AUGMENTIN) 875-125 MG per tablet Take 1 tablet by mouth 2 (two) times daily.  20 tablet  0  . aspirin 81 MG tablet Take 1 tablet (81 mg total) by mouth daily.  30 tablet    . budesonide-formoterol (SYMBICORT) 160-4.5 MCG/ACT inhaler Inhale 2 puffs into the lungs 2 (two) times daily.  1 Inhaler  12  . carvedilol (COREG) 12.5 MG tablet Take 6.25 mg by mouth 2 (two) times daily with a meal.       . clopidogrel (PLAVIX) 75 MG tablet Take 1 tablet (75 mg total) by mouth daily.  30 tablet  11  . Fluticasone-Salmeterol (ADVAIR DISKUS) 250-50 MCG/DOSE AEPB Inhale 1 puff into the lungs 2 (two) times daily.  14 each  11  . furosemide (LASIX) 40 MG tablet Take 40 mg by mouth daily as needed.       . methocarbamol (ROBAXIN) 500 MG tablet Take 1 tablet (500 mg total) by mouth 2 (two) times daily.  180 tablet  3  . omeprazole (PRILOSEC) 40 MG capsule Take 1 capsule (40 mg total) by mouth daily.  90 capsule  3  . phenazopyridine (PYRIDIUM) 200 MG tablet Take 200 mg by mouth 3 (three)  times daily as needed.        . polyethylene glycol powder (GLYCOLAX/MIRALAX) powder Take 17 g by mouth as needed.       . potassium chloride SA (K-DUR,KLOR-CON) 20 MEQ tablet Take 20 mEq by mouth daily as needed.       . Tamsulosin HCl (FLOMAX) 0.4 MG CAPS Take 0.4 mg by mouth daily.        [name] tiotropium (SPIRIVA HANDIHALER) 18 MCG inhalation capsule Place 1 capsule (18 mcg total) into inhaler and inhale daily.  30 capsule  2  . traMADol (ULTRAM) 50 MG tablet Take 1 tablet (50 mg total) by mouth 4 (four) times daily as needed.  120 tablet  3  . triamcinolone cream (KENALOG) 0.1 % Apply topically 2 (two) times daily.  30 g  0  . DISCONTD: atorvastatin (LIPITOR) 20 MG tablet Take 1 tablet (20 mg total) by mouth daily.  30 tablet  6   BP 110/60  Pulse 70  Temp(Src) 98.5 F (36.9 C) (Oral)  Ht 5\' 10"  (1.778 m)  Wt 195 lb (88.451 kg)  BMI 27.98 kg/m2  SpO2 97%  Review of Systems  Constitutional: Negative for fever, chills, activity change, appetite change, fatigue and unexpected weight change.  Eyes: Negative for visual disturbance.  Respiratory: Negative for cough, shortness of breath  and wheezing.   Cardiovascular: Negative for chest pain, palpitations and leg swelling.  Gastrointestinal: Negative for abdominal pain and abdominal distention.  Genitourinary: Negative for dysuria, urgency and difficulty urinating.  Musculoskeletal: Positive for myalgias. Negative for arthralgias and gait problem.  Skin: Positive for rash. Negative for color change.  Hematological: Negative for adenopathy.  Psychiatric/Behavioral: Negative for sleep disturbance and dysphoric mood. The patient is not nervous/anxious.        Objective:   Physical Exam  Constitutional: He is oriented to person, place, and time. He appears well-developed and well-nourished. No distress.  HENT:  Head: Normocephalic and atraumatic.  Right Ear: External ear normal.  Left Ear: External ear normal.  Nose: Nose normal.    Mouth/Throat: Oropharynx is clear and moist. No oropharyngeal exudate.  Eyes: Conjunctivae and EOM are normal. Pupils are equal, round, and reactive to light. Right eye exhibits no discharge. Left eye exhibits no discharge. No scleral icterus.  Neck: Normal range of motion. Neck supple. No tracheal deviation present. No thyromegaly present.  Cardiovascular: Normal rate, regular rhythm and normal heart sounds.  Exam reveals no gallop and no friction rub.   No murmur heard. Pulmonary/Chest: Effort normal. No accessory muscle usage. Not tachypneic. No respiratory distress. He has decreased breath sounds (however, improved compared to previous). He has no wheezes. He has no rhonchi. He has no rales. He exhibits no tenderness.  Musculoskeletal: Normal range of motion. He exhibits no edema.  Lymphadenopathy:    He has no cervical adenopathy.  Neurological: He is alert and oriented to person, place, and time. No cranial nerve deficit. Coordination normal.  Skin: Skin is warm and dry. Rash noted. Rash is macular. He is not diaphoretic. There is erythema. No pallor.     Psychiatric: He has a normal mood and affect. His behavior is normal. Judgment and thought content normal.          Assessment & Plan:

## 2011-09-18 NOTE — Assessment & Plan Note (Addendum)
Unclear etiology. Will check CMP, Mag, TSH, B12, CBC with labs today. He has had muscle cramping in the past with prednisone. Question if this might be secondary to use of triamcinolone, however this seems unlikely given the minimal exposure. Will taper triamcinolone to once daily. Follow up 2 weeks with Dr. Darrick Huntsman.

## 2011-09-18 NOTE — Assessment & Plan Note (Addendum)
Improved with triamcinolone cream. Will continue daily x2-3 more days, then stop. Pt will refrain from using brace, as this was likely allergen.

## 2011-09-18 NOTE — Assessment & Plan Note (Signed)
Symptoms much improved. Will continue augmentin. Continue inhaled bronchodilators. Follow up 2 weeks with Dr. Darrick Huntsman.

## 2011-10-03 ENCOUNTER — Ambulatory Visit (INDEPENDENT_AMBULATORY_CARE_PROVIDER_SITE_OTHER): Payer: Medicare Other | Admitting: Internal Medicine

## 2011-10-03 ENCOUNTER — Encounter: Payer: Self-pay | Admitting: Internal Medicine

## 2011-10-03 VITALS — BP 102/46 | HR 62 | Temp 98.0°F | Resp 14 | Wt 192.0 lb

## 2011-10-03 DIAGNOSIS — R0600 Dyspnea, unspecified: Secondary | ICD-10-CM

## 2011-10-03 DIAGNOSIS — D62 Acute posthemorrhagic anemia: Secondary | ICD-10-CM | POA: Diagnosis not present

## 2011-10-03 DIAGNOSIS — R300 Dysuria: Secondary | ICD-10-CM | POA: Diagnosis not present

## 2011-10-03 DIAGNOSIS — Z8546 Personal history of malignant neoplasm of prostate: Secondary | ICD-10-CM | POA: Diagnosis not present

## 2011-10-03 DIAGNOSIS — R0902 Hypoxemia: Secondary | ICD-10-CM

## 2011-10-03 DIAGNOSIS — R05 Cough: Secondary | ICD-10-CM

## 2011-10-03 DIAGNOSIS — R634 Abnormal weight loss: Secondary | ICD-10-CM | POA: Diagnosis not present

## 2011-10-03 DIAGNOSIS — R17 Unspecified jaundice: Secondary | ICD-10-CM | POA: Insufficient documentation

## 2011-10-03 DIAGNOSIS — R0989 Other specified symptoms and signs involving the circulatory and respiratory systems: Secondary | ICD-10-CM

## 2011-10-03 DIAGNOSIS — R6881 Early satiety: Secondary | ICD-10-CM

## 2011-10-03 DIAGNOSIS — N320 Bladder-neck obstruction: Secondary | ICD-10-CM | POA: Diagnosis not present

## 2011-10-03 LAB — COMPLETE METABOLIC PANEL WITH GFR
Alkaline Phosphatase: 96 U/L (ref 39–117)
Creat: 1.12 mg/dL (ref 0.50–1.35)
GFR, Est African American: 78 mL/min
GFR, Est Non African American: 68 mL/min
Glucose, Bld: 117 mg/dL — ABNORMAL HIGH (ref 70–99)
Sodium: 144 mEq/L (ref 135–145)
Total Bilirubin: 1.1 mg/dL (ref 0.3–1.2)
Total Protein: 6.1 g/dL (ref 6.0–8.3)

## 2011-10-03 NOTE — Progress Notes (Signed)
Patient ID: [name], male   DOB: [birth year], 66 y.o.   MRN: [id number]  Patient Active Problem List  Diagnosis  . COLONIC POLYPS  . HYPERLIPIDEMIA  . HYPERTENSION  . CAD, NATIVE VESSEL  . GERD  . Preoperative cardiovascular examination  . Tobacco abuse  . Tobacco abuse counseling  . Anemia associated with acute blood loss  . Chronic respiratory failure with hypoxia  . Bronchitis  . Dermatitis  . Muscle cramp  . Jaundice  . Weight loss, unintentional    Subjective:  CC:   Chief Complaint  Patient presents with  . Follow-up    HPI:   [name] a 66 y.o. male who presents for follow up on muscle cramps and COPD exacerbation.  His cramps tarted after beginning antibiotics for recent bronchitis/COPD episode.  He stopped the antibiotics and his cramps have resolved.[name]  He is now taking Septra for UTI , treated by by Annabell Howells in [place] .  He looks pale and jaundiced,  And has continued to lose weight.  His appetite has been poor for the last several months and he has had early satiety.  He denies shortness of breath, but his room air sats are 90%.     Past Medical History  Diagnosis Date  . Coronary artery disease   . COPD (chronic obstructive pulmonary disease)   . Hypertension   . Hyperlipidemia   . History of kidney stones   . History of prostate cancer   . MI (myocardial infarction) 03/2001  . Neuromuscular disorder     Past Surgical History  Procedure Date  . Prostate surgery 09/2009    prostate implant  . Back surgery 2009/2010    x 2  . Hernia repair     bilateral   . Foot surgery     bilateral   . Knee surgery     right knee   . Cardiac catheterization 2002    stent placement [facility]  . Spine surgery          The following portions of the patient's history were reviewed and updated as appropriate: Allergies, current medications, and problem list.    Review of Systems:   12 Pt  review of systems was negative except those addressed in  the HPI,     History   Social History  . Marital Status: Married    Spouse Name: N/A    Number of Children: N/A  . Years of Education: N/A   Occupational History  . Not on file.   Social History Main Topics  . Smoking status: Former Smoker -- 1.0 packs/day    Types: Cigarettes    Quit date: 02/10/2011  . Smokeless tobacco: Never Used  . Alcohol Use: No  . Drug Use: No  . Sexually Active: Not on file   Other Topics Concern  . Not on file   Social History Narrative  . No narrative on file    Objective:  BP 102/46  Pulse 62  Temp 98 F (36.7 C) (Oral)  Resp 14  Wt 192 lb (87.091 kg)  SpO2 90%  General appearance: alert, cooperative and appears stated age Ears: normal TM's and external ear canals both ears Throat: lips, mucosa, and tongue normal; teeth and gums normal Neck: no adenopathy, no carotid bruit, supple, symmetrical, trachea midline and thyroid not enlarged, symmetric, no tenderness/mass/nodules Back: symmetric, no curvature. ROM normal. No CVA tenderness. Lungs: clear to auscultation bilaterally Heart: regular rate and rhythm, S1, S2  normal, no murmur, click, rub or gallop Abdomen: soft, non-tender; bowel sounds normal; no masses,  no organomegaly Pulses: 2+ and symmetric Skin: Skin color, texture, turgor normal. No rashes or lesions Lymph nodes: Cervical, supraclavicular, and axillary nodes normal.  Assessment and Plan:  Jaundice His liver function tests are back to normal currently, raising the issue of worsening anemia as the cause for his pallor. Last hgb was 10.8 2 weeks ago.   Anemia associated with acute blood loss Hg was 10.8 2 weeks ago  With chronic duodenitis  By January 2013 EGD, H Pyjlori negative.  And he is up to date on colonoscopy   Weight loss, unintentional Given his long history of tobacco abuse I am concerned a bout occult malignancy.   Will rule out with chest abdomen and pelvic CT   Updated Medication List Outpatient  Encounter Prescriptions as of 10/03/2011  Medication Sig Dispense Refill  . albuterol (PROVENTIL HFA;VENTOLIN HFA) 108 (90 BASE) MCG/ACT inhaler Inhale 2 puffs into the lungs every 6 (six) hours as needed for wheezing.  1 Inhaler  0  . aspirin 81 MG tablet Take 1 tablet (81 mg total) by mouth daily.  30 tablet    . budesonide-formoterol (SYMBICORT) 160-4.5 MCG/ACT inhaler Inhale 2 puffs into the lungs 2 (two) times daily.  1 Inhaler  12  . carvedilol (COREG) 12.5 MG tablet Take 6.25 mg by mouth 2 (two) times daily with a meal.       . clopidogrel (PLAVIX) 75 MG tablet Take 1 tablet (75 mg total) by mouth daily.  30 tablet  11  . Fluticasone-Salmeterol (ADVAIR DISKUS) 250-50 MCG/DOSE AEPB Inhale 1 puff into the lungs 2 (two) times daily.  14 each  11  . furosemide (LASIX) 40 MG tablet Take 40 mg by mouth daily as needed.       . methocarbamol (ROBAXIN) 500 MG tablet Take 1 tablet (500 mg total) by mouth 2 (two) times daily.  180 tablet  3  . omeprazole (PRILOSEC) 40 MG capsule Take 1 capsule (40 mg total) by mouth daily.  90 capsule  3  . phenazopyridine (PYRIDIUM) 200 MG tablet Take 200 mg by mouth 3 (three) times daily as needed.        . polyethylene glycol powder (GLYCOLAX/MIRALAX) powder Take 17 g by mouth as needed.       . potassium chloride SA (K-DUR,KLOR-CON) 20 MEQ tablet Take 20 mEq by mouth daily as needed.       . Tamsulosin HCl (FLOMAX) 0.4 MG CAPS Take 0.4 mg by mouth daily.        [name] tiotropium (SPIRIVA HANDIHALER) 18 MCG inhalation capsule Place 1 capsule (18 mcg total) into inhaler and inhale daily.  30 capsule  2  . traMADol (ULTRAM) 50 MG tablet Take 1 tablet (50 mg total) by mouth 4 (four) times daily as needed.  120 tablet  3  . triamcinolone cream (KENALOG) 0.1 % Apply topically 2 (two) times daily.  30 g  0     Orders Placed This Encounter  Procedures  . CT Chest W Contrast  . CT Abdomen Pelvis W Contrast  . COMPLETE METABOLIC PANEL WITH GFR    No Follow-up on  file.

## 2011-10-04 ENCOUNTER — Encounter: Payer: Self-pay | Admitting: Internal Medicine

## 2011-10-04 ENCOUNTER — Telehealth: Payer: Self-pay | Admitting: Internal Medicine

## 2011-10-04 DIAGNOSIS — R634 Abnormal weight loss: Secondary | ICD-10-CM | POA: Insufficient documentation

## 2011-10-04 NOTE — Assessment & Plan Note (Signed)
Hg was 10.8 2 weeks ago  With chronic duodenitis  By January 2013 EGD, H Pyjlori negative.  And he is up to date on colonoscopy

## 2011-10-04 NOTE — Telephone Encounter (Signed)
His liver tests are normal.  I would like him ot get a chest abdomen and pelvic CT to investigate  the hypoxia and wt loss

## 2011-10-04 NOTE — Assessment & Plan Note (Signed)
Given his long history of tobacco abuse I am concerned a bout occult malignancy.   Will rule out with chest abdomen and pelvic CT

## 2011-10-04 NOTE — Assessment & Plan Note (Addendum)
His liver function tests are back to normal currently, raising the issue of worsening anemia as the cause for his pallor. Last hgb was 10.8 2 weeks ago.

## 2011-10-05 ENCOUNTER — Telehealth: Payer: Self-pay | Admitting: Internal Medicine

## 2011-10-05 NOTE — Telephone Encounter (Signed)
There is already a note about this, see other note.

## 2011-10-05 NOTE — Telephone Encounter (Signed)
Pt spouse is calling to check on the lab result please advise.

## 2011-10-05 NOTE — Telephone Encounter (Signed)
Patient notified. He will wait to hear from our office for appt for ct.

## 2011-10-09 ENCOUNTER — Ambulatory Visit: Payer: Self-pay | Admitting: Internal Medicine

## 2011-10-09 DIAGNOSIS — R911 Solitary pulmonary nodule: Secondary | ICD-10-CM | POA: Diagnosis not present

## 2011-10-09 DIAGNOSIS — R059 Cough, unspecified: Secondary | ICD-10-CM | POA: Diagnosis not present

## 2011-10-09 DIAGNOSIS — R0902 Hypoxemia: Secondary | ICD-10-CM | POA: Diagnosis not present

## 2011-10-09 DIAGNOSIS — R634 Abnormal weight loss: Secondary | ICD-10-CM | POA: Diagnosis not present

## 2011-10-09 DIAGNOSIS — I714 Abdominal aortic aneurysm, without rupture: Secondary | ICD-10-CM | POA: Diagnosis not present

## 2011-10-10 ENCOUNTER — Telehealth: Payer: Self-pay | Admitting: Internal Medicine

## 2011-10-10 NOTE — Telephone Encounter (Signed)
Patient notified, he made an appt for tomorrow.

## 2011-10-10 NOTE — Telephone Encounter (Signed)
Ct scan reviewed,  Nothing glaring but need to talk about a few things.,. Needs a follow appt  This week or next

## 2011-10-11 ENCOUNTER — Encounter: Payer: Self-pay | Admitting: Internal Medicine

## 2011-10-11 ENCOUNTER — Telehealth: Payer: Self-pay | Admitting: Internal Medicine

## 2011-10-11 ENCOUNTER — Ambulatory Visit (INDEPENDENT_AMBULATORY_CARE_PROVIDER_SITE_OTHER): Payer: Medicare Other | Admitting: Internal Medicine

## 2011-10-11 ENCOUNTER — Telehealth: Payer: Self-pay | Admitting: Pulmonary Disease

## 2011-10-11 VITALS — BP 90/46 | HR 65 | Temp 98.0°F | Resp 14 | Wt 189.5 lb

## 2011-10-11 DIAGNOSIS — J9611 Chronic respiratory failure with hypoxia: Secondary | ICD-10-CM

## 2011-10-11 DIAGNOSIS — R634 Abnormal weight loss: Secondary | ICD-10-CM

## 2011-10-11 DIAGNOSIS — D62 Acute posthemorrhagic anemia: Secondary | ICD-10-CM

## 2011-10-11 DIAGNOSIS — R0902 Hypoxemia: Secondary | ICD-10-CM

## 2011-10-11 DIAGNOSIS — Z720 Tobacco use: Secondary | ICD-10-CM

## 2011-10-11 DIAGNOSIS — R911 Solitary pulmonary nodule: Secondary | ICD-10-CM

## 2011-10-11 DIAGNOSIS — F17200 Nicotine dependence, unspecified, uncomplicated: Secondary | ICD-10-CM | POA: Diagnosis not present

## 2011-10-11 DIAGNOSIS — J9610 Chronic respiratory failure, unspecified whether with hypoxia or hypercapnia: Secondary | ICD-10-CM

## 2011-10-11 DIAGNOSIS — K8020 Calculus of gallbladder without cholecystitis without obstruction: Secondary | ICD-10-CM

## 2011-10-11 NOTE — Telephone Encounter (Signed)
The first available appointment with Dr Kendrick Fries is on August 5th. Had a note sent to the Triage nurse to see if she can work this patient in sooner.   If that is not possible, do you want me to try and schedule in [place]?

## 2011-10-11 NOTE — Telephone Encounter (Signed)
LMTCB for Marge- ? Can the pt come to [place] office?

## 2011-10-11 NOTE — Telephone Encounter (Signed)
Only if it's with Dr Kendrick Fries .  Patient will be gone first week in July .  Thank   You

## 2011-10-11 NOTE — Telephone Encounter (Signed)
Spoke with [employer]. She states that Dr. Darrick Huntsman only wants the pt to see Dr. Kendrick Fries. I advised that he is out of the office until next wk, will check and see if he is okay with overbooking his schedule to see this pt.    Dr. Kendrick Fries, this pt is scheduled to see you on 11/20/11 for pulmonary nodule per Dr. Darrick Huntsman. She would like for the pt to be seen sooner if possible. Please advise if okay to overbook to see him. 7/22 schedule is nothing but return visits. Please advise, thanks!

## 2011-10-11 NOTE — Telephone Encounter (Signed)
[name] returned our call.  Call her back at [phone] [name]

## 2011-10-13 ENCOUNTER — Other Ambulatory Visit: Payer: Self-pay | Admitting: Internal Medicine

## 2011-10-13 DIAGNOSIS — K8020 Calculus of gallbladder without cholecystitis without obstruction: Secondary | ICD-10-CM

## 2011-10-14 ENCOUNTER — Encounter: Payer: Self-pay | Admitting: Internal Medicine

## 2011-10-14 NOTE — Assessment & Plan Note (Signed)
Secondary to tobacco induced COPD.  Resting sats are 91%.  Continue use of bronchodilator therapy.  He has maintained tobacco free state since January.

## 2011-10-14 NOTE — Progress Notes (Signed)
Patient ID: MAVERIC DEBONO, male   DOB: 02-Oct-1945, 66 y.o.   MRN: [id number]   Patient Active Problem List  Diagnosis  . COLONIC POLYPS  . HYPERLIPIDEMIA  . HYPERTENSION  . CAD, NATIVE VESSEL  . GERD  . Tobacco abuse  . Tobacco abuse counseling  . Chronic respiratory failure with hypoxia  . Bronchitis  . Weight loss, unintentional    Subjective:  CC:   Chief Complaint  Patient presents with  . Results    CT    HPI:   Blayne Frankie Shepherdis a 66 y.o. male who presents  With abnormal CT .  Patient was recent evaluated  With  CT of chest abdomen and pelvis to rule out cholecystitis and occult malignancy vs  metastatic disease because of continued anorexia, early satiety and weight loss and returns today to discuss results.  He continues to lose weight unintentionally , a total of 16 lbs documented since November .  Denies abdominal pain, purulent sputum, and change in stool habits, but has decreased appetite, no interest in food.   He is up to date with colonoscopy.  Prostate cancer was treated and he has continued follow up with urology.   Past Medical History  Diagnosis Date  . Coronary artery disease   . COPD (chronic obstructive pulmonary disease)   . Hypertension   . Hyperlipidemia   . History of kidney stones   . History of prostate cancer   . MI (myocardial infarction) 03/2001  . Neuromuscular disorder     Past Surgical History  Procedure Date  . Prostate surgery 09/2009    prostate implant  . Back surgery 2009/2010    x 2  . Hernia repair     bilateral   . Foot surgery     bilateral   . Knee surgery     right knee   . Cardiac catheterization 2002    stent placement [place]  . Spine surgery          The following portions of the patient's history were reviewed and updated as appropriate: Allergies, current medications, and problem list.    Review of Systems:   Compfrehensive review of systems was negative except those addressed in the  HPI,     History   Social History  . Marital Status: Married    Spouse Name: N/A    Number of Children: N/A  . Years of Education: N/A   Occupational History  . Not on file.   Social History Main Topics  . Smoking status: Former Smoker -- 1.0 packs/day    Types: Cigarettes    Quit date: 02/10/2011  . Smokeless tobacco: Never Used  . Alcohol Use: No  . Drug Use: No  . Sexually Active: Not on file   Other Topics Concern  . Not on file   Social History Narrative  . No narrative on file    Objective:  BP 90/46  Pulse 65  Temp 98 F (36.7 C) (Oral)  Resp 14  Wt 189 lb 8 oz (85.957 kg)  SpO2 91%  General appearance: alert, cooperative and appears stated age Ears: normal TM's and external ear canals both ears Throat: lips, mucosa, and tongue normal; teeth and gums normal Neck: no adenopathy, no carotid bruit, supple, symmetrical, trachea midline and thyroid not enlarged, symmetric, no tenderness/mass/nodules Back: symmetric, no curvature. ROM normal. No CVA tenderness. Lungs: clear to auscultation bilaterally Heart: regular rate and rhythm, S1, S2 normal, no murmur, click, rub or  gallop Abdomen: soft, non-tender; bowel sounds normal; no masses,  no organomegaly Pulses: 2+ and symmetric Skin: Skin color, texture, turgor normal. No rashes or lesions Lymph nodes: Cervical, supraclavicular, and axillary nodes normal.  Assessment and Plan:  Anemia associated with acute blood loss He had an EGD/colonoscopy  Jan 8 which was negative for malignancies,  But showed chronic duodenitis, H Pylori negative.   Repeat EGD 2016, colonoscopy 2018.    Tobacco abuse Resolved as of January 2013 after a very long history of tobacco use. Given his new pulmonary nodule on chest CT.,, I am concerned about small cell CA and have referred him to Dr. Kendrick Fries for pulmonology opinion on follow up of  6 mm nodule.   Weight loss, unintentional Given results of CT,  Prior EGD,  Etiologies  include duodenitis, pulmonary malignancy (6 mm pulmonary nodule concerns me for small cell CA), and chronic cholecystitis given calcified gallstone on CT.  HIDA scan and pulmonary consult ordered.   Chronic respiratory failure with hypoxia Secondary to tobacco induced COPD.  Resting sats are 91%.  Continue use of bronchodilator therapy.  He has maintained tobacco free state since January.    Updated Medication List Outpatient Encounter Prescriptions as of 10/11/2011  Medication Sig Dispense Refill  . albuterol (PROVENTIL HFA;VENTOLIN HFA) 108 (90 BASE) MCG/ACT inhaler Inhale 2 puffs into the lungs every 6 (six) hours as needed for wheezing.  1 Inhaler  0  . aspirin 81 MG tablet Take 1 tablet (81 mg total) by mouth daily.  30 tablet    . budesonide-formoterol (SYMBICORT) 160-4.5 MCG/ACT inhaler Inhale 2 puffs into the lungs 2 (two) times daily.  1 Inhaler  12  . carvedilol (COREG) 12.5 MG tablet Take 6.25 mg by mouth 2 (two) times daily with a meal.       . clopidogrel (PLAVIX) 75 MG tablet Take 1 tablet (75 mg total) by mouth daily.  30 tablet  11  . Fluticasone-Salmeterol (ADVAIR DISKUS) 250-50 MCG/DOSE AEPB Inhale 1 puff into the lungs 2 (two) times daily.  14 each  11  . furosemide (LASIX) 40 MG tablet Take 40 mg by mouth daily as needed.       . methocarbamol (ROBAXIN) 500 MG tablet Take 1 tablet (500 mg total) by mouth 2 (two) times daily.  180 tablet  3  . omeprazole (PRILOSEC) 40 MG capsule Take 1 capsule (40 mg total) by mouth daily.  90 capsule  3  . phenazopyridine (PYRIDIUM) 200 MG tablet Take 200 mg by mouth 3 (three) times daily as needed.        . polyethylene glycol powder (GLYCOLAX/MIRALAX) powder Take 17 g by mouth as needed.       . potassium chloride SA (K-DUR,KLOR-CON) 20 MEQ tablet Take 20 mEq by mouth daily as needed.       . Tamsulosin HCl (FLOMAX) 0.4 MG CAPS Take 0.4 mg by mouth daily.        [name] tiotropium (SPIRIVA HANDIHALER) 18 MCG inhalation capsule Place 1 capsule (18  mcg total) into inhaler and inhale daily.  30 capsule  2  . traMADol (ULTRAM) 50 MG tablet Take 1 tablet (50 mg total) by mouth 4 (four) times daily as needed.  120 tablet  3  . triamcinolone cream (KENALOG) 0.1 % Apply topically 2 (two) times daily.  30 g  0     Orders Placed This Encounter  Procedures  . NM Hepatobiliary  . Ambulatory referral to Pulmonology    No  Follow-up on file.

## 2011-10-14 NOTE — Assessment & Plan Note (Signed)
He had an EGD/colonoscopy  Jan 8 which was negative for malignancies,  But sowed Chronic duodenitis, H Pylori negative,  Repeat EGD 2016, colonoscopy 2018.

## 2011-10-14 NOTE — Assessment & Plan Note (Signed)
Given results of CT,  Prior EGD,  Etiologies include duodenitis, occult malignancy (6 mm pulmonary nodule concerns me for small cell CA), and chronic cholecystitis given calcified gallstone on CT.  HIDA scan and pulmonary consult ordered.

## 2011-10-14 NOTE — Assessment & Plan Note (Signed)
Resolved as of January 2013 after a very long history of tobacco use. Given his new pulmonary nodule on chest CT.,, I am concerned about small cell and have referred him to Dr. Kendrick Fries for pulmonology opinion on follow up of  6 mm nodule.

## 2011-10-16 ENCOUNTER — Other Ambulatory Visit: Payer: Self-pay | Admitting: Internal Medicine

## 2011-10-16 DIAGNOSIS — K8020 Calculus of gallbladder without cholecystitis without obstruction: Secondary | ICD-10-CM

## 2011-10-17 NOTE — Telephone Encounter (Signed)
I'm OK with an overbook on 7/22

## 2011-10-18 NOTE — Telephone Encounter (Signed)
Spoke with Liborio Nixon and scheduled appt with Dr. Kendrick Fries for 11/06/11 at 1:30 in [place]. Nothing further was needed.

## 2011-10-20 ENCOUNTER — Other Ambulatory Visit (HOSPITAL_COMMUNITY): Payer: Medicare Other

## 2011-10-25 ENCOUNTER — Encounter: Payer: Self-pay | Admitting: Internal Medicine

## 2011-10-27 ENCOUNTER — Encounter (HOSPITAL_COMMUNITY)
Admission: RE | Admit: 2011-10-27 | Discharge: 2011-10-27 | Disposition: A | Discharge status: Home / Self Care | Payer: Medicare Other | Source: Ambulatory Visit | Attending: Internal Medicine | Admitting: Internal Medicine

## 2011-10-27 DIAGNOSIS — R109 Unspecified abdominal pain: Secondary | ICD-10-CM | POA: Insufficient documentation

## 2011-10-27 DIAGNOSIS — K8020 Calculus of gallbladder without cholecystitis without obstruction: Secondary | ICD-10-CM

## 2011-10-27 MED ORDER — TECHNETIUM TC 99M MEBROFENIN IV KIT
5.4000 | PACK | Freq: Once | INTRAVENOUS | Status: AC | PRN
  Administered 2011-10-27: 5 via INTRAVENOUS

## 2011-11-06 ENCOUNTER — Ambulatory Visit (INDEPENDENT_AMBULATORY_CARE_PROVIDER_SITE_OTHER): Payer: Medicare Other | Admitting: Pulmonary Disease

## 2011-11-06 ENCOUNTER — Encounter: Payer: Self-pay | Admitting: Pulmonary Disease

## 2011-11-06 VITALS — BP 100/60 | HR 62 | Temp 97.0°F | Ht 72.0 in | Wt 188.4 lb

## 2011-11-06 DIAGNOSIS — J449 Chronic obstructive pulmonary disease, unspecified: Secondary | ICD-10-CM | POA: Diagnosis not present

## 2011-11-06 DIAGNOSIS — R911 Solitary pulmonary nodule: Secondary | ICD-10-CM

## 2011-11-06 NOTE — Assessment & Plan Note (Signed)
COPD: GOLD Grade A Combined recommendations from the [employer]	[place], [employer], Designer, television/film set, European Respiratory Society (Qaseem A et al, Ann Intern Med. 2011;155(3):179) recommends tobacco cessation, pulmonary rehab (for symptomatic patients with an FEV1 < 50% predicted), supplemental oxygen (for patients with SaO2 <88% or paO2 <55), and appropriate bronchodilator therapy.  In regards to long acting bronchodilators, they recommend monotherapy (FEV1 60-80% with symptoms weak evidence, FEV1 with symptoms <60% strong evidence), or combination therapy (FEV1 <60% with symptoms, strong recommendation, moderate evidence).  One should also provide patients with annual immunizations and consider therapy for prevention of COPD exacerbations (ie. roflumilast or azithromycin) when appopriate.  -O2 therapy: Not indicated -Immunizations: address on next visit -Tobacco use: quit 2012 -Exercise: Encouraged daily exercise -Bronchodilator therapy: Considering lack of symptoms and GOLD Stage II spirometry, continuing with SABA only as needed is OK unless symptoms worsen -Exacerbation prevention: n/a

## 2011-11-06 NOTE — Assessment & Plan Note (Signed)
I explained to [name] today that the appearance of the nodule in the left lower lobe is not particularly worrisome based on the fact that it is round, smooth, solid and only 6 mm. The differential diagnosis of this nodule includes lung cancer, less likely prostate cancer, granulomatous disease, pulmonary arteriovenous malformation, lipoma, scarring from an old infection. Considering his personal history of malignancy in his extensive smoking history he is at high-risk and therefore needs a followup CT scan for observation in 3 months time. Prostate cancer rarely metastasized to the lung (although it can happen), so I would prefer to defer a biopsy unless we see growth.  Plan: -Repeat CT scan without contrast in September 2013 at [facility] - Follow up with [place] afterwards

## 2011-11-06 NOTE — Progress Notes (Signed)
Subjective:    Patient ID: [name], male    DOB: 1945-08-04, 66 y.o.   MRN: [id number]  HPI [name] is a very pleasant 66 year old male a COPD who is referred to [place] by Dr. too low for evaluation of COPD and a pulmonary nodule. He had no lung trouble as a child and was diagnosed as COPD many years ago after smoking one and a half to 2 packs a day for nearly 40 years. He stated that in 2012 he developed worsening shortness of breath which is resolved nearly instantaneously with the placement of a PCI his coronary artery. He quit smoking in December of 2012 at the time of the stent placement and has done well with minimal breathing trouble since. He states that he works around the house and can walk over a quarter of a mile without stopping for shortness of breath. The only time he needs to use his inhaler is when he is out cutting the grass in hot weather and is exposed to a lot of dust. He is a very work cough which is not productive of sputum. He denies chest pain. He is referred to [place] for evaluation of a 6 mm left lower lobe nodule that was seen on June 2012 CT scan performed at [facility]. He has lost 40 pounds in the last 6-12 months since being treated for prostate cancer with stem cell implants. He denies hemoptysis or focal chest pain.  Past Medical History  Diagnosis Date  . Coronary artery disease   . COPD (chronic obstructive pulmonary disease)   . Hypertension   . Hyperlipidemia   . History of kidney stones   . History of prostate cancer   . MI (myocardial infarction) 03/2001  . Neuromuscular disorder      Family History  Problem Relation Age of Onset  . Heart attack Mother   . Diabetes Mother   . Heart disease Father      History   Social History  . Marital Status: Married    Spouse Name: N/A    Number of Children: N/A  . Years of Education: N/A   Occupational History  . Not on file.   Social History Main Topics  . Smoking status: Former Smoker -- 1.0 packs/day     Types: Cigarettes    Quit date: 02/10/2011  . Smokeless tobacco: Never Used  . Alcohol Use: No  . Drug Use: No  . Sexually Active: Not on file   Other Topics Concern  . Not on file   Social History Narrative  . No narrative on file     Allergies  Allergen Reactions  . Lyrica (Pregabalin)     Swelling   . Prednisone     Feels like having a heart attack     Outpatient Prescriptions Prior to Visit  Medication Sig Dispense Refill  . albuterol (PROVENTIL HFA;VENTOLIN HFA) 108 (90 BASE) MCG/ACT inhaler Inhale 2 puffs into the lungs every 6 (six) hours as needed for wheezing.  1 Inhaler  0  . aspirin 81 MG tablet Take 1 tablet (81 mg total) by mouth daily.  30 tablet    . carvedilol (COREG) 12.5 MG tablet Take 6.25 mg by mouth 2 (two) times daily with a meal.       . clopidogrel (PLAVIX) 75 MG tablet Take 1 tablet (75 mg total) by mouth daily.  30 tablet  11  . methocarbamol (ROBAXIN) 500 MG tablet Take 1 tablet (500 mg total) by mouth  2 (two) times daily.  180 tablet  3  . omeprazole (PRILOSEC) 40 MG capsule Take 1 capsule (40 mg total) by mouth daily.  90 capsule  3  . phenazopyridine (PYRIDIUM) 200 MG tablet Take 200 mg by mouth 3 (three) times daily as needed.        . polyethylene glycol powder (GLYCOLAX/MIRALAX) powder Take 17 g by mouth as needed.       . potassium chloride SA (K-DUR,KLOR-CON) 20 MEQ tablet Take 20 mEq by mouth daily as needed.       . Tamsulosin HCl (FLOMAX) 0.4 MG CAPS Take 0.4 mg by mouth daily.        . traMADol (ULTRAM) 50 MG tablet Take 1 tablet (50 mg total) by mouth 4 (four) times daily as needed.  120 tablet  3  . triamcinolone cream (KENALOG) 0.1 % Apply topically 2 (two) times daily.  30 g  0  . budesonide-formoterol (SYMBICORT) 160-4.5 MCG/ACT inhaler Inhale 2 puffs into the lungs 2 (two) times daily.  1 Inhaler  12  . Fluticasone-Salmeterol (ADVAIR DISKUS) 250-50 MCG/DOSE AEPB Inhale 1 puff into the lungs 2 (two) times daily.  14 each  11  .  furosemide (LASIX) 40 MG tablet Take 40 mg by mouth daily as needed.       . tiotropium (SPIRIVA HANDIHALER) 18 MCG inhalation capsule Place 1 capsule (18 mcg total) into inhaler and inhale daily.  30 capsule  2       Review of Systems  Constitutional: Positive for appetite change and unexpected weight change. Negative for fever, chills and activity change.  HENT: Negative for hearing loss, ear pain, congestion, rhinorrhea, sneezing, neck pain, neck stiffness, postnasal drip and sinus pressure.   Eyes: Negative for redness, itching and visual disturbance.  Respiratory: Negative for cough, chest tightness, shortness of breath and wheezing.   Cardiovascular: Negative for chest pain, palpitations and leg swelling.  Gastrointestinal: Negative for nausea, vomiting, abdominal pain, diarrhea, constipation, blood in stool and abdominal distention.  Musculoskeletal: Positive for arthralgias. Negative for myalgias, joint swelling and gait problem.  Skin: Negative for rash.  Neurological: Negative for dizziness, light-headedness, numbness and headaches.  Hematological: Bruises/bleeds easily.  Psychiatric/Behavioral: Negative for confusion and dysphoric mood.       Objective:   Physical Exam  Filed Vitals:   11/06/11 1316  BP: 100/60  Pulse: 62  Temp: 97 F (36.1 C)  TempSrc: Oral  Height: 6' (1.829 m)  Weight: 188 lb 6.4 oz (85.458 kg)  SpO2: 92%   Gen: well appearing, no acute distress HEENT: NCAT, PERRL, EOMi, OP clear, neck supple without masses PULM: CTA B CV: RRR, no mgr, no JVD AB: BS+, soft, nontender, no hsm Ext: warm, no edema, no clubbing, no cyanosis Derm: no rash or skin breakdown Neuro: A&Ox4, CN II-XII intact, strength 5/5 in all 4 extremities  10/09/2011 CT chest [facility]: 6 mm round, smooth, solid left lower lobe pulmonary nodule 11/06/2011 PFT >> Ratio 60% FEV1 2.37 L (63%)    Assessment & Plan:   Pulmonary nodule I explained to Mr. Soliday today that the  appearance of the nodule in the left lower lobe is not particularly worrisome based on the fact that it is round, smooth, solid and only 6 mm. The differential diagnosis of this nodule includes lung cancer, less likely prostate cancer, granulomatous disease, pulmonary arteriovenous malformation, lipoma, scarring from an old infection. Considering his personal history of malignancy in his extensive smoking history he is at high-risk  and therefore needs a followup CT scan for observation in 3 months time. Prostate cancer rarely metastasized to the lung (although it can happen), so I would prefer to defer a biopsy unless we see growth.  Plan: -Repeat CT scan without contrast in September 2013 at [facility] - Follow up with [place] afterwards  COPD (chronic obstructive pulmonary disease) COPD: GOLD Grade A Combined recommendations from the [employer], [employer] of [employer], Designer, television/film set, European Respiratory Society (Qaseem A et al, Ann Intern Med. 2011;155(3):179) recommends tobacco cessation, pulmonary rehab (for symptomatic patients with an FEV1 < 50% predicted), supplemental oxygen (for patients with SaO2 <88% or paO2 <55), and appropriate bronchodilator therapy.  In regards to long acting bronchodilators, they recommend monotherapy (FEV1 60-80% with symptoms weak evidence, FEV1 with symptoms <60% strong evidence), or combination therapy (FEV1 <60% with symptoms, strong recommendation, moderate evidence).  One should also provide patients with annual immunizations and consider therapy for prevention of COPD exacerbations (ie. roflumilast or azithromycin) when appopriate.  -O2 therapy: Not indicated -Immunizations: address on next visit -Tobacco use: quit 2012 -Exercise: Encouraged daily exercise -Bronchodilator therapy: Considering lack of symptoms and GOLD Stage II spirometry, continuing with SABA only as needed is OK unless symptoms worsen -Exacerbation  prevention: n/a    Updated Medication List Outpatient Encounter Prescriptions as of 11/06/2011  Medication Sig Dispense Refill  . albuterol (PROVENTIL HFA;VENTOLIN HFA) 108 (90 BASE) MCG/ACT inhaler Inhale 2 puffs into the lungs every 6 (six) hours as needed for wheezing.  1 Inhaler  0  . aspirin 81 MG tablet Take 1 tablet (81 mg total) by mouth daily.  30 tablet    . carvedilol (COREG) 12.5 MG tablet Take 6.25 mg by mouth 2 (two) times daily with a meal.       . clopidogrel (PLAVIX) 75 MG tablet Take 1 tablet (75 mg total) by mouth daily.  30 tablet  11  . methocarbamol (ROBAXIN) 500 MG tablet Take 1 tablet (500 mg total) by mouth 2 (two) times daily.  180 tablet  3  . omeprazole (PRILOSEC) 40 MG capsule Take 1 capsule (40 mg total) by mouth daily.  90 capsule  3  . phenazopyridine (PYRIDIUM) 200 MG tablet Take 200 mg by mouth 3 (three) times daily as needed.        . polyethylene glycol powder (GLYCOLAX/MIRALAX) powder Take 17 g by mouth as needed.       . potassium chloride SA (K-DUR,KLOR-CON) 20 MEQ tablet Take 20 mEq by mouth daily as needed.       . Tamsulosin HCl (FLOMAX) 0.4 MG CAPS Take 0.4 mg by mouth daily.        . traMADol (ULTRAM) 50 MG tablet Take 1 tablet (50 mg total) by mouth 4 (four) times daily as needed.  120 tablet  3  . triamcinolone cream (KENALOG) 0.1 % Apply topically 2 (two) times daily.  30 g  0  . budesonide-formoterol (SYMBICORT) 160-4.5 MCG/ACT inhaler Inhale 2 puffs into the lungs 2 (two) times daily.  1 Inhaler  12  . Fluticasone-Salmeterol (ADVAIR DISKUS) 250-50 MCG/DOSE AEPB Inhale 1 puff into the lungs 2 (two) times daily.  14 each  11  . furosemide (LASIX) 40 MG tablet Take 40 mg by mouth daily as needed.       . tiotropium (SPIRIVA HANDIHALER) 18 MCG inhalation capsule Place 1 capsule (18 mcg total) into inhaler and inhale daily.  30 capsule  2

## 2011-11-06 NOTE — Patient Instructions (Addendum)
We will see you back in September after you have had a repeat CT scan of the chest to follow up the left lung pulmonary nodule Continue taking your inhalers as you are doing We will see you back sooner if needed.

## 2011-11-08 DIAGNOSIS — Z8546 Personal history of malignant neoplasm of prostate: Secondary | ICD-10-CM | POA: Diagnosis not present

## 2011-11-08 DIAGNOSIS — N320 Bladder-neck obstruction: Secondary | ICD-10-CM | POA: Diagnosis not present

## 2011-11-08 DIAGNOSIS — R3915 Urgency of urination: Secondary | ICD-10-CM | POA: Diagnosis not present

## 2011-11-08 DIAGNOSIS — R300 Dysuria: Secondary | ICD-10-CM | POA: Diagnosis not present

## 2011-11-20 ENCOUNTER — Encounter: Payer: Self-pay | Admitting: Internal Medicine

## 2011-11-20 ENCOUNTER — Ambulatory Visit: Payer: Medicare Other | Admitting: Pulmonary Disease

## 2011-11-20 ENCOUNTER — Ambulatory Visit (INDEPENDENT_AMBULATORY_CARE_PROVIDER_SITE_OTHER): Payer: Medicare Other | Admitting: Internal Medicine

## 2011-11-20 VITALS — BP 96/50 | HR 60 | Temp 98.0°F | Resp 14 | Wt 195.2 lb

## 2011-11-20 DIAGNOSIS — R634 Abnormal weight loss: Secondary | ICD-10-CM

## 2011-11-20 DIAGNOSIS — J4489 Other specified chronic obstructive pulmonary disease: Secondary | ICD-10-CM

## 2011-11-20 DIAGNOSIS — D62 Acute posthemorrhagic anemia: Secondary | ICD-10-CM | POA: Insufficient documentation

## 2011-11-20 DIAGNOSIS — D649 Anemia, unspecified: Secondary | ICD-10-CM | POA: Diagnosis not present

## 2011-11-20 DIAGNOSIS — I10 Essential (primary) hypertension: Secondary | ICD-10-CM | POA: Diagnosis not present

## 2011-11-20 DIAGNOSIS — J449 Chronic obstructive pulmonary disease, unspecified: Secondary | ICD-10-CM

## 2011-11-20 NOTE — Progress Notes (Signed)
Patient ID: [name], male   DOB: October 01, 1945, 66 y.o.   MRN: [id number] Patient Active Problem List  Diagnosis  . COLONIC POLYPS  . HYPERLIPIDEMIA  . HYPERTENSION  . CAD, NATIVE VESSEL  . GERD  . Tobacco abuse  . Tobacco abuse counseling  . Chronic respiratory failure with hypoxia  . Weight loss, unintentional  . COPD (chronic obstructive pulmonary disease)  . Pulmonary nodule  . Acute blood loss anemia    Subjective:  CC:   Chief Complaint  Patient presents with  . Follow-up    6 month    HPI:   [name] a 66 y.o. male who presents for follow up on significant unintentional weight loss pulmonary nodule and gallstones .  Since his last visit he has undergone the skin which was reportedly normal. He had an incidental pulmonary nodule 6 mm found on chest CT and has seen Dr. Kendrick Fries who has recommended followup CT in 1 year. His weight loss has abated and he has actually gained a few pounds since last visit. He states his appetite continues to be poor but he says eating more fattening choices.  He has been having persistent urinary frequency which has been addressed by Dr. Ma Rings who has started Vesicare. His nighttime voids have been reduced from 6-3 per night.    Past Medical History  Diagnosis Date  . Coronary artery disease   . COPD (chronic obstructive pulmonary disease)   . Hypertension   . Hyperlipidemia   . History of kidney stones   . History of prostate cancer   . MI (myocardial infarction) 03/2001  . Neuromuscular disorder     Past Surgical History  Procedure Date  . Prostate surgery 09/2009    prostate implant  . Back surgery 2009/2010    x 2  . Hernia repair     bilateral   . Foot surgery     bilateral   . Knee surgery     right knee   . Cardiac catheterization 2002    stent placement [place]  . Spine surgery     The following portions of the patient's history were reviewed and updated as appropriate: Allergies, current medications,  and problem list.    Review of Systems:  Positive for back pain.  The rest of a comprehensive  review of systems was negative except those addressed in the HPI,     History   Social History  . Marital Status: Married    Spouse Name: N/A    Number of Children: N/A  . Years of Education: N/A   Occupational History  . Not on file.   Social History Main Topics  . Smoking status: Former Smoker -- 1.0 packs/day    Types: Cigarettes    Quit date: 02/10/2011  . Smokeless tobacco: Never Used  . Alcohol Use: No  . Drug Use: No  . Sexually Active: Not on file   Other Topics Concern  . Not on file   Social History Narrative  . No narrative on file    Objective:  BP 96/50  Pulse 60  Temp 98 F (36.7 C) (Oral)  Resp 14  Wt 195 lb 4 oz (88.565 kg)  SpO2 92%  General appearance: alert, cooperative and appears stated age Ears: normal TM's and external ear canals both ears Throat: lips, mucosa, and tongue normal; teeth and gums normal Neck: no adenopathy, no carotid bruit, supple, symmetrical, trachea midline and thyroid not enlarged, symmetric, no tenderness/mass/nodules  Back: symmetric, no curvature. ROM normal. No CVA tenderness. Lungs: clear to auscultation bilaterally Heart: regular rate and rhythm, S1, S2 normal, no murmur, click, rub or gallop Abdomen: soft, non-tender; bowel sounds normal; no masses,  no organomegaly Pulses: 2+ and symmetric Skin: Skin color, texture, turgor normal. No rashes or lesions Lymph nodes: Cervical, supraclavicular, and axillary nodes normal.  Assessment and Plan:  HYPERTENSION .Well controlled on current regimen. Renal function stable, no changes today.  COPD (chronic obstructive pulmonary disease) With chronic respiratory failure secondary to prolonged history of tobacco abuse now abstinent since December 2012 he will followup with Dr. Kendrick Fries . continue Spiriva Symbicort and albuterol when necessary.  Weight loss,  unintentional Apparently secondary to treatment for prostate cancer. No occult malignancies were found but I chest abdomen and pelvic CT. Gallstones were found that were not obstructive by recent HIDA scan .  Acute blood loss anemia Attributed to duodenitis during last hospitalization. Hemoglobin at that time was 8.2 . Last hemoglobin was 10.9 in May and he is following up with Dr. Orlie Dakin at the end of the month.   Updated Medication List Outpatient Encounter Prescriptions as of 11/20/2011  Medication Sig Dispense Refill  . albuterol (PROVENTIL HFA;VENTOLIN HFA) 108 (90 BASE) MCG/ACT inhaler Inhale 2 puffs into the lungs every 6 (six) hours as needed for wheezing.  1 Inhaler  0  . aspirin 81 MG tablet Take 1 tablet (81 mg total) by mouth daily.  30 tablet    . budesonide-formoterol (SYMBICORT) 160-4.5 MCG/ACT inhaler Inhale 2 puffs into the lungs 2 (two) times daily.  1 Inhaler  12  . carvedilol (COREG) 12.5 MG tablet Take 6.25 mg by mouth 2 (two) times daily with a meal.       . clopidogrel (PLAVIX) 75 MG tablet Take 1 tablet (75 mg total) by mouth daily.  30 tablet  11  . Fluticasone-Salmeterol (ADVAIR DISKUS) 250-50 MCG/DOSE AEPB Inhale 1 puff into the lungs 2 (two) times daily.  14 each  11  . furosemide (LASIX) 40 MG tablet Take 40 mg by mouth daily as needed.       . methocarbamol (ROBAXIN) 500 MG tablet Take 1 tablet (500 mg total) by mouth 2 (two) times daily.  180 tablet  3  . omeprazole (PRILOSEC) 40 MG capsule Take 1 capsule (40 mg total) by mouth daily.  90 capsule  3  . phenazopyridine (PYRIDIUM) 200 MG tablet Take 200 mg by mouth 3 (three) times daily as needed.        . polyethylene glycol powder (GLYCOLAX/MIRALAX) powder Take 17 g by mouth as needed.       . potassium chloride SA (K-DUR,KLOR-CON) 20 MEQ tablet Take 20 mEq by mouth daily as needed.       . solifenacin (VESICARE) 5 MG tablet Take 10 mg by mouth daily.      . Tamsulosin HCl (FLOMAX) 0.4 MG CAPS Take 0.4 mg by  mouth daily.        [name] tiotropium (SPIRIVA HANDIHALER) 18 MCG inhalation capsule Place 1 capsule (18 mcg total) into inhaler and inhale daily.  30 capsule  2  . traMADol (ULTRAM) 50 MG tablet Take 1 tablet (50 mg total) by mouth 4 (four) times daily as needed.  120 tablet  3  . triamcinolone cream (KENALOG) 0.1 % Apply topically 2 (two) times daily.  30 g  0     No orders of the defined types were placed in this encounter.    No  Follow-up on file.

## 2011-11-20 NOTE — Assessment & Plan Note (Addendum)
With chronic respiratory failure secondary to prolonged history of tobacco abuse now abstinent since December 2012 he will followup with Dr. Kendrick Fries . continue Spiriva Symbicort and albuterol when necessary.

## 2011-11-20 NOTE — Assessment & Plan Note (Signed)
Well controlled on current regimen. Renal function stable, no changes today. 

## 2011-11-20 NOTE — Assessment & Plan Note (Addendum)
Attributed to duodenitis during last hospitalization. Hemoglobin at that time was 8.2 . Last hemoglobin was 10.9 in May and he is following up with Dr. Orlie Dakin at the end of the month.

## 2011-11-20 NOTE — Assessment & Plan Note (Signed)
Apparently secondary to treatment for prostate cancer. No occult malignancies were found but I chest abdomen and pelvic CT. Gallstones were found that were not obstructive by recent HIDA scan .

## 2011-11-23 DIAGNOSIS — M4716 Other spondylosis with myelopathy, lumbar region: Secondary | ICD-10-CM | POA: Diagnosis not present

## 2011-11-24 ENCOUNTER — Ambulatory Visit: Payer: Self-pay | Admitting: Oncology

## 2011-11-24 DIAGNOSIS — Z7982 Long term (current) use of aspirin: Secondary | ICD-10-CM | POA: Diagnosis not present

## 2011-11-24 DIAGNOSIS — I10 Essential (primary) hypertension: Secondary | ICD-10-CM | POA: Diagnosis not present

## 2011-11-24 DIAGNOSIS — D509 Iron deficiency anemia, unspecified: Secondary | ICD-10-CM | POA: Diagnosis not present

## 2011-11-24 DIAGNOSIS — Z79899 Other long term (current) drug therapy: Secondary | ICD-10-CM | POA: Diagnosis not present

## 2011-11-24 DIAGNOSIS — R911 Solitary pulmonary nodule: Secondary | ICD-10-CM | POA: Diagnosis not present

## 2011-11-24 DIAGNOSIS — I252 Old myocardial infarction: Secondary | ICD-10-CM | POA: Diagnosis not present

## 2011-11-24 DIAGNOSIS — I2510 Atherosclerotic heart disease of native coronary artery without angina pectoris: Secondary | ICD-10-CM | POA: Diagnosis not present

## 2011-11-24 DIAGNOSIS — J449 Chronic obstructive pulmonary disease, unspecified: Secondary | ICD-10-CM | POA: Diagnosis not present

## 2011-11-24 DIAGNOSIS — Z95818 Presence of other cardiac implants and grafts: Secondary | ICD-10-CM | POA: Diagnosis not present

## 2011-11-24 DIAGNOSIS — Z8546 Personal history of malignant neoplasm of prostate: Secondary | ICD-10-CM | POA: Diagnosis not present

## 2011-11-24 LAB — CBC CANCER CENTER
Basophil #: 0.1 x10 3/mm (ref 0.0–0.1)
Basophil %: 1 %
Eosinophil %: 6 %
HCT: 38.7 % — ABNORMAL LOW (ref 40.0–52.0)
Lymphocyte #: 1.3 x10 3/mm (ref 1.0–3.6)
Lymphocyte %: 17.1 %
MCV: 97 fL (ref 80–100)
Monocyte %: 5.9 %
Neutrophil #: 5.4 x10 3/mm (ref 1.4–6.5)
Platelet: 166 x10 3/mm (ref 150–440)
RBC: 3.99 10*6/uL — ABNORMAL LOW (ref 4.40–5.90)
RDW: 12.7 % (ref 11.5–14.5)
WBC: 7.8 x10 3/mm (ref 3.8–10.6)

## 2011-11-24 LAB — FERRITIN: Ferritin (ARMC): 298 ng/mL (ref 8–388)

## 2011-11-24 LAB — IRON AND TIBC
Iron Bind.Cap.(Total): 218 ug/dL — ABNORMAL LOW (ref 250–450)
Iron Saturation: 36 %
Unbound Iron-Bind.Cap.: 140 ug/dL

## 2011-12-04 ENCOUNTER — Other Ambulatory Visit: Payer: Self-pay | Admitting: Cardiovascular Disease

## 2011-12-04 NOTE — Telephone Encounter (Signed)
Refilled Atorvastatin.

## 2011-12-12 DIAGNOSIS — R3915 Urgency of urination: Secondary | ICD-10-CM | POA: Diagnosis not present

## 2011-12-12 DIAGNOSIS — N318 Other neuromuscular dysfunction of bladder: Secondary | ICD-10-CM | POA: Diagnosis not present

## 2011-12-17 ENCOUNTER — Ambulatory Visit: Payer: Self-pay | Admitting: Oncology

## 2011-12-20 ENCOUNTER — Ambulatory Visit (INDEPENDENT_AMBULATORY_CARE_PROVIDER_SITE_OTHER): Payer: Medicare Other | Admitting: Internal Medicine

## 2011-12-20 ENCOUNTER — Encounter: Payer: Self-pay | Admitting: Internal Medicine

## 2011-12-20 VITALS — BP 130/82 | HR 64 | Temp 98.3°F | Ht 70.0 in | Wt 194.0 lb

## 2011-12-20 DIAGNOSIS — J40 Bronchitis, not specified as acute or chronic: Secondary | ICD-10-CM | POA: Insufficient documentation

## 2011-12-20 MED ORDER — AMOXICILLIN-POT CLAVULANATE 875-125 MG PO TABS: 1.0000 | ORAL_TABLET | Freq: Two times a day (BID) | ORAL | Status: AC

## 2011-12-20 MED ORDER — HYDROCOD POLST-CHLORPHEN POLST 10-8 MG/5ML PO LQCR: 5.0000 mL | Freq: Two times a day (BID) | ORAL | Status: DC | PRN

## 2011-12-20 NOTE — Progress Notes (Signed)
Subjective:    Patient ID: [name], male    DOB: June 03, 1945, 66 y.o.   MRN: [id number]  HPI 66 year old male with history of COPD presents for acute visit complaining of 2-3 days of nasal congestion, cough productive of purulent sputum, and shortness of breath. He has been using his inhalers including Advair, spiriva, and albuterol as needed with minimal improvement. He denies chest pain. He denies fever but has had chills at home. He denies any known sick contacts.  Outpatient Encounter Prescriptions as of 12/20/2011  Medication Sig Dispense Refill  . albuterol (PROVENTIL HFA;VENTOLIN HFA) 108 (90 BASE) MCG/ACT inhaler Inhale 2 puffs into the lungs every 6 (six) hours as needed for wheezing.  1 Inhaler  0  . aspirin 81 MG tablet Take 1 tablet (81 mg total) by mouth daily.  30 tablet    . atorvastatin (LIPITOR) 20 MG tablet TAKE 1 TABLET BY MOUTH DAILY  30 tablet  5  . budesonide-formoterol (SYMBICORT) 160-4.5 MCG/ACT inhaler Inhale 2 puffs into the lungs 2 (two) times daily.      . carvedilol (COREG) 12.5 MG tablet Take 6.25 mg by mouth 2 (two) times daily with a meal.       . clopidogrel (PLAVIX) 75 MG tablet Take 1 tablet (75 mg total) by mouth daily.  30 tablet  11  . Fluticasone-Salmeterol (ADVAIR) 250-50 MCG/DOSE AEPB Inhale 1 puff into the lungs 2 (two) times daily.      . furosemide (LASIX) 40 MG tablet Take 40 mg by mouth daily as needed.       . methocarbamol (ROBAXIN) 500 MG tablet Take 1 tablet (500 mg total) by mouth 2 (two) times daily.  180 tablet  3  . omeprazole (PRILOSEC) 40 MG capsule Take 1 capsule (40 mg total) by mouth daily.  90 capsule  3  . phenazopyridine (PYRIDIUM) 200 MG tablet Take 200 mg by mouth 3 (three) times daily as needed.        . polyethylene glycol powder (GLYCOLAX/MIRALAX) powder Take 17 g by mouth as needed.       . potassium chloride SA (K-DUR,KLOR-CON) 20 MEQ tablet Take 20 mEq by mouth daily as needed.       . solifenacin (VESICARE) 5 MG tablet  Take 10 mg by mouth daily.      . Tamsulosin HCl (FLOMAX) 0.4 MG CAPS Take 0.4 mg by mouth daily.        [name] tiotropium (SPIRIVA) 18 MCG inhalation capsule Place 18 mcg into inhaler and inhale daily.      . traMADol (ULTRAM) 50 MG tablet Take 1 tablet (50 mg total) by mouth 4 (four) times daily as needed.  120 tablet  3  . triamcinolone cream (KENALOG) 0.1 % Apply topically 2 (two) times daily.  30 g  0  . DISCONTD: budesonide-formoterol (SYMBICORT) 160-4.5 MCG/ACT inhaler Inhale 2 puffs into the lungs 2 (two) times daily.  1 Inhaler  12  . DISCONTD: Fluticasone-Salmeterol (ADVAIR DISKUS) 250-50 MCG/DOSE AEPB Inhale 1 puff into the lungs 2 (two) times daily.  14 each  11  . DISCONTD: tiotropium (SPIRIVA HANDIHALER) 18 MCG inhalation capsule Place 1 capsule (18 mcg total) into inhaler and inhale daily.  30 capsule  2  . amoxicillin-clavulanate (AUGMENTIN) 875-125 MG per tablet Take 1 tablet by mouth 2 (two) times daily.  20 tablet  0  . chlorpheniramine-HYDROcodone (TUSSIONEX) 10-8 MG/5ML LQCR Take 5 mLs by mouth every 12 (twelve) hours as needed.  140 mL  0   BP 130/82  Pulse 64  Temp 98.3 F (36.8 C) (Oral)  Ht 5\' 10"  (1.778 m)  Wt 194 lb (87.998 kg)  BMI 27.84 kg/m2  SpO2 93%  Review of Systems  Constitutional: Positive for chills and fatigue. Negative for fever and activity change.  HENT: Negative for hearing loss, ear pain, nosebleeds, congestion, sore throat, rhinorrhea, sneezing, trouble swallowing, neck pain, neck stiffness, voice change, postnasal drip, sinus pressure, tinnitus and ear discharge.   Eyes: Negative for discharge, redness, itching and visual disturbance.  Respiratory: Positive for cough and shortness of breath. Negative for chest tightness, wheezing and stridor.   Cardiovascular: Negative for chest pain and leg swelling.  Musculoskeletal: Negative for myalgias and arthralgias.  Skin: Negative for color change and rash.  Neurological: Negative for dizziness, facial  asymmetry and headaches.  Psychiatric/Behavioral: Negative for disturbed wake/sleep cycle.       Objective:   Physical Exam  Constitutional: He is oriented to person, place, and time. He appears well-developed and well-nourished. No distress.  HENT:  Head: Normocephalic and atraumatic.  Right Ear: External ear normal. Tympanic membrane is erythematous and bulging.  Left Ear: External ear normal. Tympanic membrane is not erythematous and not bulging.  Nose: Nose normal.  Mouth/Throat: Oropharynx is clear and moist. No oropharyngeal exudate.  Eyes: Conjunctivae and EOM are normal. Pupils are equal, round, and reactive to light. Right eye exhibits no discharge. Left eye exhibits no discharge. No scleral icterus.  Neck: Normal range of motion. Neck supple. No tracheal deviation present. No thyromegaly present.  Cardiovascular: Normal rate, regular rhythm and normal heart sounds.  Exam reveals no gallop and no friction rub.   No murmur heard. Pulmonary/Chest: Effort normal. No accessory muscle usage. Not tachypneic. No respiratory distress. He has decreased breath sounds (prolonged exp). He has no wheezes. He has no rhonchi. He has no rales. He exhibits no tenderness.  Musculoskeletal: Normal range of motion. He exhibits no edema.  Lymphadenopathy:    He has no cervical adenopathy.  Neurological: He is alert and oriented to person, place, and time. No cranial nerve deficit. Coordination normal.  Skin: Skin is warm and dry. No rash noted. He is not diaphoretic. No erythema. No pallor.  Psychiatric: He has a normal mood and affect. His behavior is normal. Judgment and thought content normal.          Assessment & Plan:

## 2011-12-20 NOTE — Assessment & Plan Note (Signed)
Symptoms consistent with early acute bronchitis. Will treat with Augmentin and continue inhaled albuterol, Advair. Patient will call if symptoms are not improving over the next 48 hours.

## 2011-12-27 ENCOUNTER — Encounter: Payer: Self-pay | Admitting: Internal Medicine

## 2011-12-27 ENCOUNTER — Ambulatory Visit (INDEPENDENT_AMBULATORY_CARE_PROVIDER_SITE_OTHER): Payer: Medicare Other | Admitting: Internal Medicine

## 2011-12-27 VITALS — BP 98/48 | HR 60 | Temp 98.9°F | Resp 14 | Wt 187.5 lb

## 2011-12-27 DIAGNOSIS — J40 Bronchitis, not specified as acute or chronic: Secondary | ICD-10-CM | POA: Diagnosis not present

## 2011-12-27 DIAGNOSIS — R197 Diarrhea, unspecified: Secondary | ICD-10-CM

## 2011-12-27 DIAGNOSIS — A09 Infectious gastroenteritis and colitis, unspecified: Secondary | ICD-10-CM

## 2011-12-27 DIAGNOSIS — I10 Essential (primary) hypertension: Secondary | ICD-10-CM

## 2011-12-27 NOTE — Progress Notes (Signed)
Patient ID: GEO SLONE, male   DOB: Feb 29, 1946, 66 y.o.   MRN: [id number]  Patient Active Problem List  Diagnosis  . COLONIC POLYPS  . HYPERLIPIDEMIA  . HYPERTENSION  . CAD, NATIVE VESSEL  . GERD  . Tobacco abuse counseling  . Weight loss, unintentional  . COPD (chronic obstructive pulmonary disease)  . Pulmonary nodule  . Acute blood loss anemia  . Bronchitis  . Diarrhea of presumed infectious origin    Subjective:  CC:   Chief Complaint  Patient presents with  . Follow-up    one week    HPI:   Michail Boyte Shepherdis a 66 y.o. male who presents New onset Diarrhea which started one week ago, 2 days after augmentin was started for sinusitis.[name]  Has lost 5 lbs bc of the diarrhea. He has a history of fecal incontinence due to back surgery and has been having increased problems due to the diarrhea.  Waking up at night with loose stools. No fevers, some mild abdominal cramping at the time of stooling. No vomiting or nausea.   Past Medical History  Diagnosis Date  . Coronary artery disease   . COPD (chronic obstructive pulmonary disease)   . Hypertension   . Hyperlipidemia   . History of kidney stones   . History of prostate cancer   . MI (myocardial infarction) 03/2001  . Neuromuscular disorder     Past Surgical History  Procedure Date  . Prostate surgery 09/2009    prostate implant  . Back surgery 2009/2010    x 2  . Hernia repair     bilateral   . Foot surgery     bilateral   . Knee surgery     right knee   . Cardiac catheterization 2002    stent placement [place]  . Spine surgery          The following portions of the patient's history were reviewed and updated as appropriate: Allergies, current medications, and problem list.    Review of Systems:   12 Pt  review of systems was negative except those addressed in the HPI,     History   Social History  . Marital Status: Married    Spouse Name: N/A    Number of Children: N/A  . Years of  Education: N/A   Occupational History  . Not on file.   Social History Main Topics  . Smoking status: Former Smoker -- 1.0 packs/day    Types: Cigarettes    Quit date: 02/10/2011  . Smokeless tobacco: Never Used  . Alcohol Use: No  . Drug Use: No  . Sexually Active: Not on file   Other Topics Concern  . Not on file   Social History Narrative  . No narrative on file    Objective:  BP 98/48  Pulse 60  Temp 98.9 F (37.2 C) (Oral)  Resp 14  Wt 187 lb 8 oz (85.049 kg)  SpO2 93%  General appearance: pale, chronically ill appearing. alert, cooperative and appears stated age Ears: normal TM's and external ear canals both ears Throat: lips, mucosa, and tongue normal; teeth and gums normal Neck: no adenopathy, no carotid bruit, supple, symmetrical, trachea midline and thyroid not enlarged, symmetric, no tenderness/mass/nodules Back: symmetric, no curvature. ROM normal. No CVA tenderness. Lungs: clear to auscultation bilaterally Heart: regular rate and rhythm, S1, S2 normal, no murmur, click, rub or gallop Abdomen: soft, non-tender; bowel sounds normal; no masses,  no organomegaly Pulses: 2+ and  symmetric Skin: Skin color, texture, turgor normal. No rashes or lesions Lymph nodes: Cervical, supraclavicular, and axillary nodes normal.  Assessment and Plan:  Diarrhea of presumed infectious origin Presumed to be secondary to Augmentin.  stool has been sent for determination of whether this is Clostridium difficile colitis. I put her on Flagyl for him. Treatment. Enteric precautions discussed. Clear liquid diet discussed  Bronchitis His wife reports that he is wheezing at night when supine but today on exam his pulmonary exam is normal.  HYPERTENSION He is slightly hypotensive today due to the diarrhea and presumed dehydration. I have asked him to suspend his carvedilol for the next few days.   Updated Medication List Outpatient Encounter Prescriptions as of 12/27/2011    Medication Sig Dispense Refill  . albuterol (PROVENTIL HFA;VENTOLIN HFA) 108 (90 BASE) MCG/ACT inhaler Inhale 2 puffs into the lungs every 6 (six) hours as needed for wheezing.  1 Inhaler  0  . amoxicillin-clavulanate (AUGMENTIN) 875-125 MG per tablet Take 1 tablet by mouth 2 (two) times daily.  20 tablet  0  . aspirin 81 MG tablet Take 1 tablet (81 mg total) by mouth daily.  30 tablet    . atorvastatin (LIPITOR) 20 MG tablet TAKE 1 TABLET BY MOUTH DAILY  30 tablet  5  . budesonide-formoterol (SYMBICORT) 160-4.5 MCG/ACT inhaler Inhale 2 puffs into the lungs 2 (two) times daily.      . carvedilol (COREG) 12.5 MG tablet Take 6.25 mg by mouth 2 (two) times daily with a meal.       . clopidogrel (PLAVIX) 75 MG tablet Take 1 tablet (75 mg total) by mouth daily.  30 tablet  11  . Fluticasone-Salmeterol (ADVAIR) 250-50 MCG/DOSE AEPB Inhale 1 puff into the lungs 2 (two) times daily.      . furosemide (LASIX) 40 MG tablet Take 40 mg by mouth daily as needed.       . methocarbamol (ROBAXIN) 500 MG tablet Take 1 tablet (500 mg total) by mouth 2 (two) times daily.  180 tablet  3  . omeprazole (PRILOSEC) 40 MG capsule Take 1 capsule (40 mg total) by mouth daily.  90 capsule  3  . phenazopyridine (PYRIDIUM) 200 MG tablet Take 200 mg by mouth 3 (three) times daily as needed.        . polyethylene glycol powder (GLYCOLAX/MIRALAX) powder Take 17 g by mouth as needed.       . potassium chloride SA (K-DUR,KLOR-CON) 20 MEQ tablet Take 20 mEq by mouth daily as needed.       . solifenacin (VESICARE) 5 MG tablet Take 10 mg by mouth daily.      . Tamsulosin HCl (FLOMAX) 0.4 MG CAPS Take 0.4 mg by mouth daily.        [name] tiotropium (SPIRIVA) 18 MCG inhalation capsule Place 18 mcg into inhaler and inhale daily.      . traMADol (ULTRAM) 50 MG tablet Take 1 tablet (50 mg total) by mouth 4 (four) times daily as needed.  120 tablet  3  . triamcinolone cream (KENALOG) 0.1 % Apply topically 2 (two) times daily.  30 g  0  .  DISCONTD: chlorpheniramine-HYDROcodone (TUSSIONEX) 10-8 MG/5ML LQCR Take 5 mLs by mouth every 12 (twelve) hours as needed.  140 mL  0     Orders Placed This Encounter  Procedures  . Basic metabolic panel  . Magnesium  . C difficile Toxins A+B W/Rflx    Return in about 1 week (around 01/03/2012).

## 2011-12-27 NOTE — Patient Instructions (Addendum)
Stop the augmentin for now. Use afrin after your flush your sinuses with salt water twice daily for 5 days,  Then stop   Limit diet to Gatorade,  Chicken broth,  Jello,  Toast,  Rice for a few days   Avoid dairy,  Salad,  Greasy food until stools return to normal.  We are testing you and treating your for clostridium dificile colitis ("C diff")    Wash hands thoroughly,  clorox the bathroom .  I will call in metronidazole. (flagyl) to take as an antibiotic   suspend your carvedilol until you are feeling better

## 2011-12-28 LAB — BASIC METABOLIC PANEL
BUN: 15 mg/dL (ref 6–23)
Calcium: 8.9 mg/dL (ref 8.4–10.5)
GFR: 69.27 mL/min (ref >60.00)
Glucose, Bld: 121 mg/dL — ABNORMAL HIGH (ref 70–99)
Sodium: 140 mEq/L (ref 135–145)

## 2011-12-29 ENCOUNTER — Encounter: Payer: Self-pay | Admitting: Internal Medicine

## 2011-12-29 DIAGNOSIS — R197 Diarrhea, unspecified: Secondary | ICD-10-CM | POA: Insufficient documentation

## 2011-12-29 NOTE — Assessment & Plan Note (Signed)
Presumed to be secondary to Augmentin.  stool has been sent for determination of whether this is Clostridium difficile colitis. I put her on Flagyl for him. Treatment. Enteric precautions discussed. Clear liquid diet discussed

## 2011-12-29 NOTE — Assessment & Plan Note (Addendum)
He is slightly hypotensive today due to the diarrhea and presumed dehydration. I have asked him to suspend his carvedilol for the next few days.

## 2011-12-29 NOTE — Assessment & Plan Note (Addendum)
His wife reports that he is wheezing at night when supine but today on exam his pulmonary exam is normal.

## 2012-01-01 ENCOUNTER — Telehealth: Payer: Self-pay | Admitting: Internal Medicine

## 2012-01-01 NOTE — Telephone Encounter (Signed)
Patient's wife calling for his stool results.

## 2012-01-02 NOTE — Telephone Encounter (Signed)
Pt spouse called back checking on results

## 2012-01-02 NOTE — Telephone Encounter (Signed)
They have not been resulted yet. Can jamie call the lab and  find out what is taking so long. He should be taking the antibiotics anyway.

## 2012-01-03 ENCOUNTER — Encounter: Payer: Self-pay | Admitting: Cardiovascular Disease

## 2012-01-03 ENCOUNTER — Ambulatory Visit (INDEPENDENT_AMBULATORY_CARE_PROVIDER_SITE_OTHER): Payer: Medicare Other | Admitting: Internal Medicine

## 2012-01-03 ENCOUNTER — Ambulatory Visit (INDEPENDENT_AMBULATORY_CARE_PROVIDER_SITE_OTHER): Payer: Medicare Other | Admitting: Cardiovascular Disease

## 2012-01-03 ENCOUNTER — Encounter: Payer: Self-pay | Admitting: Internal Medicine

## 2012-01-03 VITALS — BP 110/60 | HR 62 | Temp 98.0°F | Resp 14 | Wt 188.2 lb

## 2012-01-03 VITALS — BP 98/60 | HR 63 | Ht 72.0 in | Wt 188.2 lb

## 2012-01-03 DIAGNOSIS — J449 Chronic obstructive pulmonary disease, unspecified: Secondary | ICD-10-CM

## 2012-01-03 DIAGNOSIS — R197 Diarrhea, unspecified: Secondary | ICD-10-CM

## 2012-01-03 DIAGNOSIS — I2510 Atherosclerotic heart disease of native coronary artery without angina pectoris: Secondary | ICD-10-CM

## 2012-01-03 DIAGNOSIS — E785 Hyperlipidemia, unspecified: Secondary | ICD-10-CM

## 2012-01-03 DIAGNOSIS — I10 Essential (primary) hypertension: Secondary | ICD-10-CM

## 2012-01-03 DIAGNOSIS — R634 Abnormal weight loss: Secondary | ICD-10-CM

## 2012-01-03 LAB — O+P EXAM, FORMALIN ONLY

## 2012-01-03 LAB — STOOL CULTURE

## 2012-01-03 LAB — C DIFFICILE, CYTOTOXIN B

## 2012-01-03 NOTE — Progress Notes (Signed)
Patient ID: [name], male    DOB: 08-23-1945, 66 y.o.   MRN: [id number]  HPI Comments: 66 years old with CAD,  posterior MI in 2002 treated with a bare-metal stent to the circumflex artery,  back surgery by Dr. Noel Gerold x2 In 2009 and 2010, and he had a negative Myoview scan at that time with an ejection fraction of 60% (2010).He had another preop evaluation in February 2011 prior to prostate biopsy and he ended up having a seed implantation. abnormal EKG noted on office visit in 2012,  He had a stress test that showed anterior wall ischemia. Cardiac catheterization was performed on January 05 2011 that showed 95% proximal LAD disease. A bare metal stent was placed ( 3.5 x 18 mm vision bare metal stent).  Since then, his breathing has been much better.   he is s/p back surgery with profound anemia noted after the surgery. He was admitted to the hospital after routine blood work showed a HCT of 8. In the ER was noted to have hypotension. He had a transfusion, EGD, colonoscopy.   CT of the ABD did not show a bleed. Her is now back on ASA and plavix. He has received an iron infusion.   He reports that he has lost significant weight over the past month. He had a sinus infection initially and was treated with Augmentin. He developed diarrhea with high suspicion for C. Difficile. He is currently on Flagyl and reports that his bowel movements are slowly improving. Carvedilol has been on hold secondary to low blood pressure. He is taking Coreg 3.125 mg twice a day in the past day or so her blood pressure continues to run low. He has mild general malaise otherwise feels well.   Recent CT scan showed abdominal aortic aneurysm 3.7 cm x 3.2 cm   EKG today shows sinus rhythm with rate 63 beats per minute with ST-wave abnormality in V5 through V6       Outpatient Encounter Prescriptions as of 01/03/2012  Medication Sig Dispense Refill  . albuterol (PROVENTIL HFA;VENTOLIN HFA) 108 (90 BASE) MCG/ACT  inhaler Inhale 2 puffs into the lungs every 6 (six) hours as needed for wheezing.  1 Inhaler  0  . aspirin 81 MG tablet Take 1 tablet (81 mg total) by mouth daily.  30 tablet    . atorvastatin (LIPITOR) 20 MG tablet TAKE 1 TABLET BY MOUTH DAILY  30 tablet  5  . budesonide-formoterol (SYMBICORT) 160-4.5 MCG/ACT inhaler Inhale 2 puffs into the lungs 2 (two) times daily.      . carvedilol (COREG) 12.5 MG tablet Take 6.25 mg by mouth 2 (two) times daily with a meal.       . clopidogrel (PLAVIX) 75 MG tablet Take 1 tablet (75 mg total) by mouth daily.  30 tablet  11  . Fluticasone-Salmeterol (ADVAIR) 250-50 MCG/DOSE AEPB Inhale 1 puff into the lungs 2 (two) times daily.      . furosemide (LASIX) 40 MG tablet Take 40 mg by mouth daily as needed.       . methocarbamol (ROBAXIN) 500 MG tablet Take 1 tablet (500 mg total) by mouth 2 (two) times daily.  180 tablet  3  . MetroNIDAZOLE (FLAGYL PO) Take by mouth 3 (three) times daily.      [name] omeprazole (PRILOSEC) 40 MG capsule Take 1 capsule (40 mg total) by mouth daily.  90 capsule  3  . phenazopyridine (PYRIDIUM) 200 MG tablet Take 200 mg by mouth  3 (three) times daily as needed.        . polyethylene glycol powder (GLYCOLAX/MIRALAX) powder Take 17 g by mouth as needed.       . potassium chloride SA (K-DUR,KLOR-CON) 20 MEQ tablet Take 20 mEq by mouth daily as needed.       . solifenacin (VESICARE) 5 MG tablet Take 10 mg by mouth daily.      . Tamsulosin HCl (FLOMAX) 0.4 MG CAPS Take 0.4 mg by mouth daily.        [name] tiotropium (SPIRIVA) 18 MCG inhalation capsule Place 18 mcg into inhaler and inhale daily.      . traMADol (ULTRAM) 50 MG tablet Take 1 tablet (50 mg total) by mouth 4 (four) times daily as needed.  120 tablet  3  . DISCONTD: triamcinolone cream (KENALOG) 0.1 % Apply topically 2 (two) times daily.  30 g  0     Review of Systems  Constitutional: Positive for unexpected weight change.  HENT: Negative.   Eyes: Negative.   Cardiovascular:  Negative.   Gastrointestinal: Positive for diarrhea.  Skin: Negative.   Neurological: Negative.   Hematological: Negative.   Psychiatric/Behavioral: Negative.   All other systems reviewed and are negative.    BP 98/60  Pulse 63  Ht 6' (1.829 m)  Wt 188 lb 4 oz (85.39 kg)  BMI 25.53 kg/m2  Physical Exam  Nursing note and vitals reviewed. Constitutional: He is oriented to person, place, and time. He appears well-developed and well-nourished.  HENT:  Head: Normocephalic.  Nose: Nose normal.  Mouth/Throat: Oropharynx is clear and moist.  Eyes: Conjunctivae normal are normal. Pupils are equal, round, and reactive to light.  Neck: Normal range of motion. Neck supple. No JVD present.  Cardiovascular: Normal rate, regular rhythm, S1 normal, S2 normal, normal heart sounds and intact distal pulses.  Exam reveals no gallop and no friction rub.   No murmur heard. Pulmonary/Chest: Effort normal. No respiratory distress. He has decreased breath sounds. He has no wheezes. He has no rales. He exhibits no tenderness.  Abdominal: Soft. Bowel sounds are normal. He exhibits no distension. There is no tenderness.  Musculoskeletal: Normal range of motion. He exhibits no edema and no tenderness.  Lymphadenopathy:    He has no cervical adenopathy.  Neurological: He is alert and oriented to person, place, and time. Coordination normal.  Skin: Skin is warm and dry. No rash noted. No erythema.  Psychiatric: He has a normal mood and affect. His behavior is normal. Judgment and thought content normal.           Assessment and Plan

## 2012-01-03 NOTE — Assessment & Plan Note (Signed)
Lipids close to goal. With recent weight loss, likely well within goal.

## 2012-01-03 NOTE — Progress Notes (Signed)
Patient ID: [name], male   DOB: 05-02-1945, 66 y.o.   MRN: [id number]  Patient Active Problem List  Diagnosis  . COLONIC POLYPS  . HYPERLIPIDEMIA  . HYPERTENSION  . CAD, NATIVE VESSEL  . GERD  . Tobacco abuse counseling  . Weight loss, unintentional  . COPD (chronic obstructive pulmonary disease)  . Pulmonary nodule  . Acute blood loss anemia  . Bronchitis  . Diarrhea of presumed infectious origin    Subjective:  CC:   Chief Complaint  Patient presents with  . Follow-up    one week    HPI:   [name] a 66 y.o. male who presents for followup on recent diarrheal episode. The diarrhea started after he was treated with antibiotics for COPD exacerbation. He was treated empirically for C. difficile colitis with metronidazole. This stool sample was sent for C. difficile antigen testing and is still pending at time of one week followup. Patient's diarrhea has resolved. He has one day of metronidazole remaining. He is having regular stools now. He has been on a clear liquid diet and is ready to advance his diet.   Past Medical History  Diagnosis Date  . Coronary artery disease   . COPD (chronic obstructive pulmonary disease)   . Hypertension   . Hyperlipidemia   . History of kidney stones   . History of prostate cancer   . MI (myocardial infarction) 03/2001  . Neuromuscular disorder     Past Surgical History  Procedure Date  . Prostate surgery 09/2009    prostate implant  . Back surgery 2009/2010    x 2  . Hernia repair     bilateral   . Foot surgery     bilateral   . Knee surgery     right knee   . Cardiac catheterization 2002    stent placement   . Spine surgery     The following portions of the patient's history were reviewed and updated as appropriate: Allergies, current medications, and problem list.   Review of Systems:   Review of Systems  Constitutional: Negative for fever, chills and weight loss.  Respiratory: Negative for  cough and shortness of breath.   Cardiovascular: Negative for chest pain and orthopnea.  Gastrointestinal: Negative for abdominal pain, diarrhea and blood in stool.  Skin: Negative for rash.  Psychiatric/Behavioral: The patient does not have insomnia.      History   Social History  . Marital Status: Married    Spouse Name: N/A    Number of Children: N/A  . Years of Education: N/A   Occupational History  . Not on file.   Social History Main Topics  . Smoking status: Former Smoker -- 1.0 packs/day    Types: Cigarettes    Quit date: 02/10/2011  . Smokeless tobacco: Never Used  . Alcohol Use: No  . Drug Use: No  . Sexually Active: Not on file   Other Topics Concern  . Not on file   Social History Narrative  . No narrative on file    Objective:  BP 110/60  Pulse 62  Temp 98 F (36.7 C) (Oral)  Resp 14  Wt 188 lb 4 oz (85.39 kg)  SpO2 94%  General appearance: alert, cooperative and appears stated age Ears: normal TM's and external ear canals both ears Throat: lips, mucosa, and tongue normal; teeth and gums normal Neck: no adenopathy, no carotid bruit, supple, symmetrical, trachea midline and thyroid not enlarged, symmetric, no tenderness/mass/nodules  Back: symmetric, no curvature. ROM normal. No CVA tenderness. Lungs: clear to auscultation bilaterally Heart: regular rate and rhythm, S1, S2 normal, no murmur, click, rub or gallop Abdomen: soft, non-tender; bowel sounds normal; no masses,  no organomegaly Pulses: 2+ and symmetric Skin: Skin color, texture, turgor normal. No rashes or lesions Lymph nodes: Cervical, supraclavicular, and axillary nodes normal.  Assessment and Plan:  Diarrhea of presumed infectious origin He was treated empirically for C. difficile colitis given his recent use of antibiotics. His symptoms of diarrhea and cramping have resolved with clear liquid diet in one week of metronidazole. The C. difficile test is still pending because it was  sent to the wrong lab. His wife had one evening of similar symptoms several days ago but this resolved. He is ready to advance his diet to bland diet. Diet reviewed.   HYPERTENSION At last week's visit he was hypotensive and his carvedilol was suspended. He has been instructed to resume coreg now .  COPD (chronic obstructive pulmonary disease) His wife reports that his nighttime wheezing has resolved.  Weight loss, unintentional Recent episode of weight loss was due to his diarrhea. He has gained 1 pound since last week. Her appetite has improved.   Updated Medication List Outpatient Encounter Prescriptions as of 01/03/2012  Medication Sig Dispense Refill  . albuterol (PROVENTIL HFA;VENTOLIN HFA) 108 (90 BASE) MCG/ACT inhaler Inhale 2 puffs into the lungs every 6 (six) hours as needed for wheezing.  1 Inhaler  0  . aspirin 81 MG tablet Take 1 tablet (81 mg total) by mouth daily.  30 tablet    . atorvastatin (LIPITOR) 20 MG tablet TAKE 1 TABLET BY MOUTH DAILY  30 tablet  5  . budesonide-formoterol (SYMBICORT) 160-4.5 MCG/ACT inhaler Inhale 2 puffs into the lungs 2 (two) times daily.      . carvedilol (COREG) 12.5 MG tablet Take 6.25 mg by mouth 2 (two) times daily with a meal.       . clopidogrel (PLAVIX) 75 MG tablet Take 1 tablet (75 mg total) by mouth daily.  30 tablet  11  . Fluticasone-Salmeterol (ADVAIR) 250-50 MCG/DOSE AEPB Inhale 1 puff into the lungs 2 (two) times daily.      . furosemide (LASIX) 40 MG tablet Take 40 mg by mouth daily as needed.       . methocarbamol (ROBAXIN) 500 MG tablet Take 1 tablet (500 mg total) by mouth 2 (two) times daily.  180 tablet  3  . omeprazole (PRILOSEC) 40 MG capsule Take 1 capsule (40 mg total) by mouth daily.  90 capsule  3  . phenazopyridine (PYRIDIUM) 200 MG tablet Take 200 mg by mouth 3 (three) times daily as needed.        . polyethylene glycol powder (GLYCOLAX/MIRALAX) powder Take 17 g by mouth as needed.       . potassium chloride SA  (K-DUR,KLOR-CON) 20 MEQ tablet Take 20 mEq by mouth daily as needed.       . solifenacin (VESICARE) 5 MG tablet Take 10 mg by mouth daily.      . Tamsulosin HCl (FLOMAX) 0.4 MG CAPS Take 0.4 mg by mouth daily.        [name] tiotropium (SPIRIVA) 18 MCG inhalation capsule Place 18 mcg into inhaler and inhale daily.      . traMADol (ULTRAM) 50 MG tablet Take 1 tablet (50 mg total) by mouth 4 (four) times daily as needed.  120 tablet  3  . DISCONTD: triamcinolone cream (KENALOG)  0.1 % Apply topically 2 (two) times daily.  30 g  0

## 2012-01-03 NOTE — Assessment & Plan Note (Addendum)
He was treated empirically for C. difficile colitis given his recent use of antibiotics. His symptoms of diarrhea and cramping have resolved with clear liquid diet in one week of metronidazole. The C. difficile test is still pending because it was sent to the wrong lab. His wife had one evening of similar symptoms several days ago but this resolved. He is ready to advance his diet to bland diet. Diet reviewed.

## 2012-01-03 NOTE — Telephone Encounter (Signed)
[name] has contacted the lab, they have to call her back because they said they have not received the results.

## 2012-01-03 NOTE — Assessment & Plan Note (Signed)
Mild stable shortness of breath. No significant changes.

## 2012-01-03 NOTE — Assessment & Plan Note (Signed)
Blood pressure is low. We have suggested he hold his carvedilol for now until blood pressure improves.

## 2012-01-03 NOTE — Patient Instructions (Addendum)
Resume everything but greasy food and roughage  Try to eat a little yogurt daily to repopulate your gut with good bacteria .  The alternative is a probiotic medicine you can buy OTC called Align

## 2012-01-03 NOTE — Assessment & Plan Note (Signed)
Currently with no symptoms of angina. No further workup at this time. Continue current medication regimen. 

## 2012-01-03 NOTE — Assessment & Plan Note (Signed)
Diarrhea is slowly improving on Flagyl. We have suggested he followup with Dr. Darrick Huntsman if symptoms recur after Flagyl is finished.

## 2012-01-03 NOTE — Assessment & Plan Note (Addendum)
At last week's visit he was hypotensive and his carvedilol was suspended. He has been instructed to resume coreg now .

## 2012-01-03 NOTE — Patient Instructions (Addendum)
You are doing well. No medication changes were made. Hold coreg until blood pressure improves  Please call us if you have new issues that need to be addressed before your next appt.  Your physician wants you to follow-up in: 6 months.  You will receive a reminder letter in the mail two months in advance. If you don't receive a letter, please call our office to schedule the follow-up appointment.

## 2012-01-03 NOTE — Assessment & Plan Note (Addendum)
His wife reports that his nighttime wheezing has resolved.

## 2012-01-04 DIAGNOSIS — H2510 Age-related nuclear cataract, unspecified eye: Secondary | ICD-10-CM | POA: Diagnosis not present

## 2012-01-04 NOTE — Assessment & Plan Note (Signed)
Recent episode of weight loss was due to his diarrhea. He has gained 1 pound since last week. Her appetite has improved.

## 2012-01-05 ENCOUNTER — Encounter: Payer: Self-pay | Admitting: Internal Medicine

## 2012-01-10 ENCOUNTER — Ambulatory Visit: Payer: Self-pay | Admitting: Pulmonary Disease

## 2012-01-10 DIAGNOSIS — R911 Solitary pulmonary nodule: Secondary | ICD-10-CM | POA: Diagnosis not present

## 2012-01-10 DIAGNOSIS — J984 Other disorders of lung: Secondary | ICD-10-CM | POA: Diagnosis not present

## 2012-01-10 DIAGNOSIS — I2510 Atherosclerotic heart disease of native coronary artery without angina pectoris: Secondary | ICD-10-CM | POA: Diagnosis not present

## 2012-01-10 DIAGNOSIS — K750 Abscess of liver: Secondary | ICD-10-CM | POA: Diagnosis not present

## 2012-01-15 ENCOUNTER — Encounter: Payer: Self-pay | Admitting: Pulmonary Disease

## 2012-01-15 ENCOUNTER — Other Ambulatory Visit: Payer: Self-pay | Admitting: Pulmonary Disease

## 2012-01-15 ENCOUNTER — Other Ambulatory Visit: Payer: Self-pay | Admitting: Cardiovascular Disease

## 2012-01-15 ENCOUNTER — Ambulatory Visit (INDEPENDENT_AMBULATORY_CARE_PROVIDER_SITE_OTHER): Payer: Medicare Other | Admitting: Pulmonary Disease

## 2012-01-15 VITALS — BP 124/80 | HR 66 | Temp 98.1°F | Ht 71.0 in | Wt 188.1 lb

## 2012-01-15 DIAGNOSIS — J449 Chronic obstructive pulmonary disease, unspecified: Secondary | ICD-10-CM | POA: Diagnosis not present

## 2012-01-15 DIAGNOSIS — R911 Solitary pulmonary nodule: Secondary | ICD-10-CM

## 2012-01-15 DIAGNOSIS — J4489 Other specified chronic obstructive pulmonary disease: Secondary | ICD-10-CM

## 2012-01-15 DIAGNOSIS — Z23 Encounter for immunization: Secondary | ICD-10-CM | POA: Diagnosis not present

## 2012-01-15 NOTE — Assessment & Plan Note (Signed)
Fortunately there was no change on his CT scan  Plan: -repeat CT chest in 6 months

## 2012-01-15 NOTE — Assessment & Plan Note (Signed)
Stable interval.  He recovered well from a recent bout of bronchitis and an URI.  Plan: -continue SABA prn -flu shot given today

## 2012-01-15 NOTE — Patient Instructions (Signed)
We will have you follow up with [place] in 6 months with a CT scan of the chest to evaluate the pulmonary nodules Call us if you need to see [place] sooner

## 2012-01-15 NOTE — Progress Notes (Signed)
Subjective:    Patient ID: [name], male    DOB: [birth year], 66 y.o.   MRN: [id number]  Synopsis: [name] first saw the [facility] in July 2013 for a pulmonary nodule. He has a greater than 50-pack-year smoking history and quit in 2012. He was found on his initial visit to have gold stage II COPD but has minimal symptoms. The nodule was 6 mm and located in the left lower lobe there is also a 6 mm pleural-based nodule seen in the right middle lobe.  HPI  01/15/12 ROV --  Since his last visit [name] is been doing very well. He had a bout of tonsillitis and bronchitis apparently several weeks ago which was treated with Augmentin and associated with diarrhea. The diarrhea apparently lasted for quite some time but is now resolved. He states that he really didn't have much shortness of breath with this and he has not been feeling more short of breath or having cough since then. He went for a CT scan at [facility] on 01/10/2012.pmh  Past Medical History  Diagnosis Date  . Coronary artery disease   . COPD (chronic obstructive pulmonary disease)   . Hypertension   . Hyperlipidemia   . History of kidney stones   . History of prostate cancer   . MI (myocardial infarction) 03/2001  . Neuromuscular disorder      Review of Systems Gen: no weight loss, fever, chills CV: no chest pain, dyspnea, swelling    Objective:   Physical Exam  Filed Vitals:   01/15/12 1359  BP: 124/80  Pulse: 66  Temp: 98.1 F (36.7 C)  TempSrc: Oral  Height: 5\' 11"  (1.803 m)  Weight: 188 lb 1.9 oz (85.331 kg)  SpO2: 97%   Gen: well appearing, no acute distress HEENT: NCAT, PERRL, EOMi, OP clear, neck supple,  without masses, L TM full but no fluid, pus, or edema PULM: CTA B CV: RRR, no mgr, no JVD AB: BS+, soft, nontender, no hsm Ext: warm, no edema, no clubbing, no cyanosis  01/10/2012 CT chest [facility] (my read): stable LLL nodule, stable RML nodule     Assessment & Plan:   COPD  (chronic obstructive pulmonary disease) Stable interval.  He recovered well from a recent bout of bronchitis and an URI.  Plan: -continue SABA prn -flu shot given today  Pulmonary nodule Fortunately there was no change on his CT scan  Plan: -repeat CT chest in 6 months   Updated Medication List Outpatient Encounter Prescriptions as of 01/15/2012  Medication Sig Dispense Refill  . albuterol (PROVENTIL HFA;VENTOLIN HFA) 108 (90 BASE) MCG/ACT inhaler Inhale 2 puffs into the lungs every 6 (six) hours as needed for wheezing.  1 Inhaler  0  . aspirin 81 MG tablet Take 1 tablet (81 mg total) by mouth daily.  30 tablet    . atorvastatin (LIPITOR) 20 MG tablet TAKE 1 TABLET BY MOUTH DAILY  30 tablet  5  . budesonide-formoterol (SYMBICORT) 160-4.5 MCG/ACT inhaler Inhale 2 puffs into the lungs 2 (two) times daily.      . clopidogrel (PLAVIX) 75 MG tablet TAKE ONE (1) TABLET EACH DAY  30 tablet  5  . clopidogrel (PLAVIX) 75 MG tablet Take 75 mg by mouth daily.      . Fluticasone-Salmeterol (ADVAIR) 250-50 MCG/DOSE AEPB Inhale 1 puff into the lungs 2 (two) times daily.      . furosemide (LASIX) 40 MG tablet Take 40 mg by mouth daily as  needed.       . methocarbamol (ROBAXIN) 500 MG tablet Take 1 tablet (500 mg total) by mouth 2 (two) times daily.  180 tablet  3  . MetroNIDAZOLE (FLAGYL PO) Take by mouth 3 (three) times daily.      [name] omeprazole (PRILOSEC) 40 MG capsule Take 1 capsule (40 mg total) by mouth daily.  90 capsule  3  . phenazopyridine (PYRIDIUM) 200 MG tablet Take 200 mg by mouth 3 (three) times daily as needed.        . polyethylene glycol powder (GLYCOLAX/MIRALAX) powder Take 17 g by mouth as needed.       . potassium chloride SA (K-DUR,KLOR-CON) 20 MEQ tablet Take 20 mEq by mouth daily as needed.       . solifenacin (VESICARE) 5 MG tablet Take 10 mg by mouth daily.      . Tamsulosin HCl (FLOMAX) 0.4 MG CAPS Take 0.4 mg by mouth daily.        [name] tiotropium (SPIRIVA) 18 MCG inhalation  capsule Place 18 mcg into inhaler and inhale daily.      . traMADol (ULTRAM) 50 MG tablet Take 1 tablet (50 mg total) by mouth 4 (four) times daily as needed.  120 tablet  3  . carvedilol (COREG) 12.5 MG tablet Take 6.25 mg by mouth 2 (two) times daily with a meal.

## 2012-01-15 NOTE — Telephone Encounter (Signed)
Refilled Clopidogrel. 

## 2012-01-18 DIAGNOSIS — M19049 Primary osteoarthritis, unspecified hand: Secondary | ICD-10-CM | POA: Diagnosis not present

## 2012-01-24 ENCOUNTER — Encounter: Payer: Self-pay | Admitting: Pulmonary Disease

## 2012-02-12 DIAGNOSIS — M19049 Primary osteoarthritis, unspecified hand: Secondary | ICD-10-CM | POA: Diagnosis not present

## 2012-02-27 ENCOUNTER — Ambulatory Visit: Payer: Self-pay | Admitting: Oncology

## 2012-02-27 DIAGNOSIS — Z8546 Personal history of malignant neoplasm of prostate: Secondary | ICD-10-CM | POA: Diagnosis not present

## 2012-02-27 DIAGNOSIS — Z800 Family history of malignant neoplasm of digestive organs: Secondary | ICD-10-CM | POA: Diagnosis not present

## 2012-02-27 DIAGNOSIS — Z801 Family history of malignant neoplasm of trachea, bronchus and lung: Secondary | ICD-10-CM | POA: Diagnosis not present

## 2012-02-27 DIAGNOSIS — J438 Other emphysema: Secondary | ICD-10-CM | POA: Diagnosis not present

## 2012-02-27 DIAGNOSIS — R911 Solitary pulmonary nodule: Secondary | ICD-10-CM | POA: Diagnosis not present

## 2012-02-27 DIAGNOSIS — Z79899 Other long term (current) drug therapy: Secondary | ICD-10-CM | POA: Diagnosis not present

## 2012-02-27 DIAGNOSIS — Z888 Allergy status to other drugs, medicaments and biological substances status: Secondary | ICD-10-CM | POA: Diagnosis not present

## 2012-02-27 DIAGNOSIS — Z87891 Personal history of nicotine dependence: Secondary | ICD-10-CM | POA: Diagnosis not present

## 2012-02-27 DIAGNOSIS — I252 Old myocardial infarction: Secondary | ICD-10-CM | POA: Diagnosis not present

## 2012-02-27 DIAGNOSIS — Z8049 Family history of malignant neoplasm of other genital organs: Secondary | ICD-10-CM | POA: Diagnosis not present

## 2012-02-27 DIAGNOSIS — Z885 Allergy status to narcotic agent status: Secondary | ICD-10-CM | POA: Diagnosis not present

## 2012-02-27 DIAGNOSIS — I10 Essential (primary) hypertension: Secondary | ICD-10-CM | POA: Diagnosis not present

## 2012-02-27 DIAGNOSIS — Z7982 Long term (current) use of aspirin: Secondary | ICD-10-CM | POA: Diagnosis not present

## 2012-02-27 DIAGNOSIS — D509 Iron deficiency anemia, unspecified: Secondary | ICD-10-CM | POA: Diagnosis not present

## 2012-02-27 LAB — CBC CANCER CENTER
Basophil %: 1.3 %
HCT: 45.2 % (ref 40.0–52.0)
HGB: 14.9 g/dL (ref 13.0–18.0)
Lymphocyte %: 21.5 %
MCV: 93 fL (ref 80–100)
Monocyte #: 0.6 x10 3/mm (ref 0.2–1.0)
Monocyte %: 6.3 %
Neutrophil #: 6.6 x10 3/mm — ABNORMAL HIGH (ref 1.4–6.5)
WBC: 9.8 x10 3/mm (ref 3.8–10.6)

## 2012-02-27 LAB — IRON AND TIBC
Iron Bind.Cap.(Total): 257 ug/dL (ref 250–450)
Iron: 81 ug/dL (ref 65–175)

## 2012-02-27 LAB — FERRITIN: Ferritin (ARMC): 214 ng/mL (ref 8–388)

## 2012-03-11 ENCOUNTER — Encounter: Payer: Self-pay | Admitting: Internal Medicine

## 2012-03-11 ENCOUNTER — Ambulatory Visit (INDEPENDENT_AMBULATORY_CARE_PROVIDER_SITE_OTHER): Payer: Medicare Other | Admitting: Internal Medicine

## 2012-03-11 VITALS — BP 118/62 | HR 74 | Temp 98.5°F | Resp 12 | Ht 72.0 in | Wt 195.2 lb

## 2012-03-11 DIAGNOSIS — J069 Acute upper respiratory infection, unspecified: Secondary | ICD-10-CM

## 2012-03-11 MED ORDER — LEVOFLOXACIN 500 MG PO TABS: 500.0000 mg | ORAL_TABLET | Freq: Every day | ORAL | Status: DC

## 2012-03-11 NOTE — Progress Notes (Signed)
Patient ID: [name], male   DOB: Mar 06, 1946, 66 y.o.   MRN: [id number]  Patient Active Problem List  Diagnosis  . COLONIC POLYPS  . HYPERLIPIDEMIA  . HYPERTENSION  . CAD, NATIVE VESSEL  . GERD  . Tobacco abuse counseling  . Weight loss, unintentional  . COPD (chronic obstructive pulmonary disease)  . Pulmonary nodule  . Acute blood loss anemia  . URI, acute    Subjective:  CC:   Chief Complaint  Patient presents with  . Cough    HPI:   [name] a 66 y.o. male who presents Feeling bad since Friday.  Started with sore throat, then developed copious nasal drainage, clear, accompanied by a  productive cough with occasional green sputum .  Denies fevers and  wheezing, but using his  Bronchodilators  As directed.  History of COPD but quit smoking several months ago.  Several grandchildren of daycare age spent the weekend with him. No dyspnea, chest pain, myalgias, neck pain or headaches.      Past Medical History  Diagnosis Date  . Coronary artery disease   . COPD (chronic obstructive pulmonary disease)   . Hypertension   . Hyperlipidemia   . History of kidney stones   . History of prostate cancer   . MI (myocardial infarction) 03/2001  . Neuromuscular disorder     Past Surgical History  Procedure Date  . Prostate surgery 09/2009    prostate implant  . Back surgery 2009/2010    x 2  . Hernia repair     bilateral   . Foot surgery     bilateral   . Knee surgery     right knee   . Cardiac catheterization 2002    stent placement [place]  . Spine surgery      The following portions of the patient's history were reviewed and updated as appropriate: Allergies, current medications, and problem list.    Review of Systems:   Patient denies headache, fevers,  unintentional weight loss, skin rash, eye pain, sinus pain, , dysphagia,  hemoptysis , dyspnea, wheezing, chest pain, palpitations, orthopnea, edema, abdominal pain, nausea, melena, diarrhea,  constipation, flank pain, dysuria, hematuria, urinary  Frequency, nocturia, numbness, tingling, seizures,  Focal weakness, Loss of consciousness,  Tremor, insomnia, depression, anxiety, and suicidal ideation.        History   Social History  . Marital Status: Married    Spouse Name: N/A    Number of Children: N/A  . Years of Education: N/A   Occupational History  . Not on file.   Social History Main Topics  . Smoking status: Former Smoker -- 1.0 packs/day    Types: Cigarettes    Quit date: 02/10/2011  . Smokeless tobacco: Never Used  . Alcohol Use: No  . Drug Use: No  . Sexually Active: Not on file   Other Topics Concern  . Not on file   Social History Narrative  . No narrative on file    Objective:  BP 118/62  Pulse 74  Temp 98.5 F (36.9 C) (Oral)  Resp 12  Ht 6' (1.829 m)  Wt 195 lb 4 oz (88.565 kg)  BMI 26.48 kg/m2  SpO2 94%  General appearance: alert, cooperative and appears stated age Ears: normal TM's and external ear canals both ears Throat: lips, mucosa, and tongue normal; teeth and gums normal Neck: no adenopathy, no carotid bruit, supple, symmetrical, trachea midline and thyroid not enlarged, symmetric, no tenderness/mass/nodules Back: symmetric,  no curvature. ROM normal. No CVA tenderness. Lungs: clear to auscultation bilaterally Heart: regular rate and rhythm, S1, S2 normal, no murmur, click, rub or gallop Abdomen: soft, non-tender; bowel sounds normal; no masses,  no organomegaly Pulses: 2+ and symmetric Skin: Skin color, texture, turgor normal. No rashes or lesions Lymph nodes: Cervical, supraclavicular, and axillary nodes normal.  Assessment and Plan:  URI, acute Current presentation suggest viral etiology. However he has COPD.  Will treat supportively,  Advised to start the antibiotic I have given  only if if symptoms fail to resolve in a few days or gprogress to include fevers, chest , facial or ear pain, or purulent/blood streaked sputum  or nasal discharge.    Updated Medication List Outpatient Encounter Prescriptions as of 03/11/2012  Medication Sig Dispense Refill  . albuterol (PROVENTIL HFA;VENTOLIN HFA) 108 (90 BASE) MCG/ACT inhaler Inhale 2 puffs into the lungs every 6 (six) hours as needed for wheezing.  1 Inhaler  0  . aspirin 81 MG tablet Take 1 tablet (81 mg total) by mouth daily.  30 tablet    . atorvastatin (LIPITOR) 20 MG tablet TAKE 1 TABLET BY MOUTH DAILY  30 tablet  5  . budesonide-formoterol (SYMBICORT) 160-4.5 MCG/ACT inhaler Inhale 2 puffs into the lungs 2 (two) times daily.      . carvedilol (COREG) 12.5 MG tablet Take 6.25 mg by mouth 2 (two) times daily with a meal.       . clopidogrel (PLAVIX) 75 MG tablet TAKE ONE (1) TABLET EACH DAY  30 tablet  5  . clopidogrel (PLAVIX) 75 MG tablet Take 75 mg by mouth daily.      . Fluticasone-Salmeterol (ADVAIR) 250-50 MCG/DOSE AEPB Inhale 1 puff into the lungs 2 (two) times daily.      . furosemide (LASIX) 40 MG tablet Take 40 mg by mouth daily as needed.       . methocarbamol (ROBAXIN) 500 MG tablet Take 1 tablet (500 mg total) by mouth 2 (two) times daily.  180 tablet  3  . MetroNIDAZOLE (FLAGYL PO) Take by mouth 3 (three) times daily.      [name] omeprazole (PRILOSEC) 40 MG capsule Take 1 capsule (40 mg total) by mouth daily.  90 capsule  3  . phenazopyridine (PYRIDIUM) 200 MG tablet Take 200 mg by mouth 3 (three) times daily as needed.        . polyethylene glycol powder (GLYCOLAX/MIRALAX) powder Take 17 g by mouth as needed.       . potassium chloride SA (K-DUR,KLOR-CON) 20 MEQ tablet Take 20 mEq by mouth daily as needed.       . solifenacin (VESICARE) 5 MG tablet Take 10 mg by mouth daily.      . Tamsulosin HCl (FLOMAX) 0.4 MG CAPS Take 0.4 mg by mouth daily.        [name] tiotropium (SPIRIVA) 18 MCG inhalation capsule Place 18 mcg into inhaler and inhale daily.      . traMADol (ULTRAM) 50 MG tablet Take 1 tablet (50 mg total) by mouth 4 (four) times daily as needed.   120 tablet  3  . levofloxacin (LEVAQUIN) 500 MG tablet Take 1 tablet (500 mg total) by mouth daily.  7 tablet  0     No orders of the defined types were placed in this encounter.    No Follow-up on file.

## 2012-03-11 NOTE — Patient Instructions (Addendum)
You have a viral  Syndrome .  The post nasal drip is causing your sore throat.  Lavage your sinuses twice daly with Simply saline nasal spray.  Use benadryl 25 mg every 8 hours and Sudafed PE 10 to 30 every 8 hours to manage the drainage and congestion.  Gargle with salt water often for the sore throat.  Use Delsym OTC or Robitussin DM for the cough   If the throat is no better  In 3 to 4 days OR  if you develop T > 100.4,  Or  Sinus/ear  pain,  Start the levaquin  (antibiotic) .

## 2012-03-12 ENCOUNTER — Encounter: Payer: Self-pay | Admitting: Internal Medicine

## 2012-03-12 NOTE — Assessment & Plan Note (Signed)
Current presentation suggest viral etiology. However he has COPD.  Will treat supportively,  Advised to start the antibiotic I have given  only if if symptoms fail to resolve in a few days or gprogress to include fevers, chest , facial or ear pain, or purulent/blood streaked sputum or nasal discharge.  

## 2012-03-17 ENCOUNTER — Ambulatory Visit: Payer: Self-pay | Admitting: Oncology

## 2012-03-20 DIAGNOSIS — M4716 Other spondylosis with myelopathy, lumbar region: Secondary | ICD-10-CM | POA: Diagnosis not present

## 2012-03-27 DIAGNOSIS — M19049 Primary osteoarthritis, unspecified hand: Secondary | ICD-10-CM | POA: Diagnosis not present

## 2012-05-08 DIAGNOSIS — N318 Other neuromuscular dysfunction of bladder: Secondary | ICD-10-CM | POA: Diagnosis not present

## 2012-05-15 DIAGNOSIS — Z8546 Personal history of malignant neoplasm of prostate: Secondary | ICD-10-CM | POA: Diagnosis not present

## 2012-05-15 DIAGNOSIS — N320 Bladder-neck obstruction: Secondary | ICD-10-CM | POA: Diagnosis not present

## 2012-05-15 DIAGNOSIS — R3915 Urgency of urination: Secondary | ICD-10-CM | POA: Diagnosis not present

## 2012-06-12 ENCOUNTER — Other Ambulatory Visit: Payer: Self-pay | Admitting: Internal Medicine

## 2012-06-12 NOTE — Telephone Encounter (Signed)
Received refill electronically. See warning. Is it okay to refill?

## 2012-06-13 ENCOUNTER — Telehealth: Payer: Self-pay | Admitting: Pulmonary Disease

## 2012-06-13 DIAGNOSIS — R911 Solitary pulmonary nodule: Secondary | ICD-10-CM

## 2012-06-13 NOTE — Telephone Encounter (Signed)
Ok to refill 

## 2012-06-13 NOTE — Telephone Encounter (Signed)
this pt needs a CT sacn prior to his follow up apt with McQuaid.

## 2012-06-13 NOTE — Telephone Encounter (Signed)
Will send an order to the [facility] to get ct chest scheduled at [facility] w/o contrast and pt also needs ov to f/u with BQ.

## 2012-06-17 ENCOUNTER — Telehealth: Payer: Self-pay | Admitting: Pulmonary Disease

## 2012-06-17 NOTE — Telephone Encounter (Signed)
Spoke with pt's spouse and advised that upcoming ct was ordered without CM, so no special prep before scan She verbalized understanding and states nothing further needed

## 2012-06-20 DIAGNOSIS — M4716 Other spondylosis with myelopathy, lumbar region: Secondary | ICD-10-CM | POA: Diagnosis not present

## 2012-06-25 ENCOUNTER — Other Ambulatory Visit: Payer: Self-pay | Admitting: Cardiovascular Disease

## 2012-06-25 ENCOUNTER — Ambulatory Visit: Payer: Self-pay | Admitting: Pulmonary Disease

## 2012-06-25 DIAGNOSIS — R911 Solitary pulmonary nodule: Secondary | ICD-10-CM | POA: Diagnosis not present

## 2012-06-25 DIAGNOSIS — R918 Other nonspecific abnormal finding of lung field: Secondary | ICD-10-CM | POA: Diagnosis not present

## 2012-06-25 NOTE — Telephone Encounter (Signed)
Refilled Lipitor sent to [facility].

## 2012-07-02 ENCOUNTER — Ambulatory Visit (INDEPENDENT_AMBULATORY_CARE_PROVIDER_SITE_OTHER): Payer: Medicare Other | Admitting: Pulmonary Disease

## 2012-07-02 ENCOUNTER — Encounter: Payer: Self-pay | Admitting: Pulmonary Disease

## 2012-07-02 ENCOUNTER — Telehealth: Payer: Self-pay | Admitting: *Deleted

## 2012-07-02 VITALS — BP 144/81 | HR 67 | Temp 98.4°F | Ht 72.0 in | Wt 212.0 lb

## 2012-07-02 DIAGNOSIS — R911 Solitary pulmonary nodule: Secondary | ICD-10-CM

## 2012-07-02 DIAGNOSIS — J449 Chronic obstructive pulmonary disease, unspecified: Secondary | ICD-10-CM

## 2012-07-02 NOTE — Patient Instructions (Signed)
Keep taking your inhaler as you inhaler as you are doing We will set up another CT scan in 6 months at [facility] and see you afterwards Let us know if you develop worsening shortness of breath, a new cough, unexplained weight loss, or coughing up blood

## 2012-07-02 NOTE — Assessment & Plan Note (Signed)
Fortunately this has not changed on the most recent CT chest.  We will plan to see him again after the next study in 6 months.  He was advised to call us if he has weight loss, cough, hemoptysis, increasing dyspnea.

## 2012-07-02 NOTE — Telephone Encounter (Signed)
Message copied by Christen Butter on Tue Jul 02, 2012  1:17 PM ------      Message from: Lupita Leash      Created: Tue Jul 02, 2012 12:56 PM       L,            Please let him know that his CT chest showed that the nodule has not changed.  This is good news.            Thanks,      B ------

## 2012-07-02 NOTE — Progress Notes (Signed)
Subjective:    Patient ID: [name], male    DOB: 08-05-1945, 67 y.o.   MRN: [id number]  Synopsis: [name] first saw the [place] [facility] in July 2013 for a pulmonary nodule. He has a greater than 50-pack-year smoking history and quit in 2012. He was found on his initial visit to have gold stage II COPD but has minimal symptoms. The nodule was 6 mm and located in the left lower lobe there is also a 6 mm pleural-based nodule seen in the right middle lobe.  HPI   01/15/12 ROV --  Since his last visit [name] is been doing very well. He had a bout of tonsillitis and bronchitis apparently several weeks ago which was treated with Augmentin and associated with diarrhea. The diarrhea apparently lasted for quite some time but is now resolved. He states that he really didn't have much shortness of breath with this and he has not been feeling more short of breath or having cough since then. He went for a CT scan at [facility] on 01/10/2012.pmh  07/02/2012 ROV -- Since our last visit Cincere has been doing well.  He had to use his inhaler once last week after he spent the day cleaning trees up in his yard, but other than that he really hasn't had to use it.  He has not been short of breath and cough is minimal.  He gained 12 lbs since the last visit.  His daughter has lung cancer and this has been bothering them some, but otherwise he is doing OK.  Past Medical History  Diagnosis Date  . Coronary artery disease   . COPD (chronic obstructive pulmonary disease)   . Hypertension   . Hyperlipidemia   . History of kidney stones   . History of prostate cancer   . MI (myocardial infarction) 03/2001  . Neuromuscular disorder      Review of Systems  Constitutional: Negative for fever, chills and fatigue.  HENT: Negative for congestion, rhinorrhea, sneezing and sinus pressure.   Respiratory: Negative for cough, shortness of breath and wheezing.   Cardiovascular: Negative for chest pain,  palpitations and leg swelling.      Objective:   Physical Exam   Filed Vitals:   07/02/12 1536  BP: 144/81  Pulse: 67  Temp: 98.4 F (36.9 C)  TempSrc: Oral  Height: 6' (1.829 m)  Weight: 212 lb (96.163 kg)  SpO2: 96%   Gen: well appearing, no acute distress HEENT: NCAT, EOMi, OP clear,  PULM: Few fine inspiratory crackles throughout CV: RRR, no mgr, no JVD AB: BS+, soft, nontender, no hsm Ext: warm, no edema, no clubbing, no cyanosis  10/09/2011 CT chest [facility]: 6 mm round smooth solid pulmonary nodule in the left lower lobe 01/10/2012 CT chest [facility]: 6 mm nodule in LLL stable, stable 5.8 cm pleural based lesion RML 06/25/2012 CT Chest [facility] > 6mm nodule in LLL unchanged, scarring in lingula likely retained mucus     Assessment & Plan:   Pulmonary nodule Fortunately this has not changed on the most recent CT chest.  We will plan to see him again after the next study in 6 months.  He was advised to call us if he has weight loss, cough, hemoptysis, increasing dyspnea.  COPD (chronic obstructive pulmonary disease) GOLD Grade A disease. This has been a stable interval for [facility].  He only needs his albuterol on an as needed basis.    Plan: -continue spiriva and symbicort -continue intermittent albuterol  use -we will see him back in 6 months    Updated Medication List Outpatient Encounter Prescriptions as of 07/02/2012  Medication Sig Dispense Refill  . albuterol (PROVENTIL HFA;VENTOLIN HFA) 108 (90 BASE) MCG/ACT inhaler Inhale 2 puffs into the lungs every 6 (six) hours as needed for wheezing.  1 Inhaler  0  . aspirin 81 MG tablet Take 1 tablet (81 mg total) by mouth daily.  30 tablet    . atorvastatin (LIPITOR) 20 MG tablet TAKE 1 TABLET BY MOUTH DAILY  30 tablet  3  . budesonide-formoterol (SYMBICORT) 160-4.5 MCG/ACT inhaler Inhale 1 puff into the lungs daily.       . carvedilol (COREG) 12.5 MG tablet Take 6.25 mg by mouth 2 (two) times daily with a meal.       . clopidogrel  (PLAVIX) 75 MG tablet TAKE ONE (1) TABLET EACH DAY  30 tablet  5  . Fluticasone-Salmeterol (ADVAIR) 250-50 MCG/DOSE AEPB Inhale 1 puff into the lungs 2 (two) times daily.      . furosemide (LASIX) 40 MG tablet Take 40 mg by mouth daily as needed.       . methocarbamol (ROBAXIN) 500 MG tablet Take 1 tablet (500 mg total) by mouth 2 (two) times daily.  180 tablet  3  . omeprazole (PRILOSEC) 40 MG capsule TAKE ONE (1) CAPSULE EACH DAY  90 capsule  1  . polyethylene glycol powder (GLYCOLAX/MIRALAX) powder Take 17 g by mouth as needed.       . potassium chloride SA (K-DUR,KLOR-CON) 20 MEQ tablet Take 20 mEq by mouth daily as needed.       . solifenacin (VESICARE) 5 MG tablet Take 10 mg by mouth daily.      [name] tiotropium (SPIRIVA) 18 MCG inhalation capsule Place 18 mcg into inhaler and inhale daily.      . traMADol (ULTRAM) 50 MG tablet Take 1 tablet (50 mg total) by mouth 4 (four) times daily as needed.  120 tablet  3  . [DISCONTINUED] clopidogrel (PLAVIX) 75 MG tablet Take 75 mg by mouth daily.      . [DISCONTINUED] levofloxacin (LEVAQUIN) 500 MG tablet Take 1 tablet (500 mg total) by mouth daily.  7 tablet  0  . [DISCONTINUED] MetroNIDAZOLE (FLAGYL PO) Take by mouth 3 (three) times daily.      . [DISCONTINUED] phenazopyridine (PYRIDIUM) 200 MG tablet Take 200 mg by mouth 3 (three) times daily as needed.        . [DISCONTINUED] Tamsulosin HCl (FLOMAX) 0.4 MG CAPS Take 0.4 mg by mouth daily.         No facility-administered encounter medications on file as of 07/02/2012.

## 2012-07-02 NOTE — Assessment & Plan Note (Addendum)
GOLD Grade A disease. This has been a stable interval for [facility].  He only needs his albuterol on an as needed basis.    Plan: -continue spiriva and symbicort -continue intermittent albuterol use -we will see him back in 6 months

## 2012-07-02 NOTE — Telephone Encounter (Signed)
LMTCB for pt 

## 2012-07-02 NOTE — Telephone Encounter (Signed)
Spoke with pt and notified of results per Dr.McQuaid. Pt verbalized understanding and denied any questions. 

## 2012-07-11 ENCOUNTER — Other Ambulatory Visit: Payer: Self-pay | Admitting: Cardiovascular Disease

## 2012-07-11 NOTE — Telephone Encounter (Signed)
Refilled [facility]

## 2012-07-22 ENCOUNTER — Encounter: Payer: Self-pay | Admitting: Pulmonary Disease

## 2012-08-12 ENCOUNTER — Encounter: Payer: Self-pay | Admitting: Cardiovascular Disease

## 2012-08-12 ENCOUNTER — Ambulatory Visit (INDEPENDENT_AMBULATORY_CARE_PROVIDER_SITE_OTHER): Payer: Medicare Other | Admitting: Cardiovascular Disease

## 2012-08-12 VITALS — BP 130/72 | HR 63 | Ht 72.0 in | Wt 206.5 lb

## 2012-08-12 DIAGNOSIS — I2510 Atherosclerotic heart disease of native coronary artery without angina pectoris: Secondary | ICD-10-CM | POA: Diagnosis not present

## 2012-08-12 DIAGNOSIS — I10 Essential (primary) hypertension: Secondary | ICD-10-CM

## 2012-08-12 DIAGNOSIS — E785 Hyperlipidemia, unspecified: Secondary | ICD-10-CM | POA: Diagnosis not present

## 2012-08-12 NOTE — Assessment & Plan Note (Signed)
Blood pressure is well controlled on today's visit. No changes made to the medications. 

## 2012-08-12 NOTE — Assessment & Plan Note (Signed)
Currently with no symptoms of angina. No further workup at this time. Continue current medication regimen. 

## 2012-08-12 NOTE — Progress Notes (Signed)
Patient ID: [name], male    DOB: 1945-08-10, 67 y.o.   MRN: [id number]  HPI Comments: 67 years old with CAD,  posterior MI in 2002 treated with a bare-metal stent to the circumflex artery,  back surgery by Dr. Noel Gerold x2 In 2009 and 2010, and he had a negative Myoview scan at that time with an ejection fraction of 60% (2010).He had another preop evaluation in February 2011 prior to prostate biopsy and he ended up having a seed implantation. abnormal EKG noted on office visit in 2012,  He had a stress test that showed anterior wall ischemia. Cardiac catheterization was performed on January 05 2011 that showed 95% proximal LAD disease. A bare metal stent was placed ( 3.5 x 18 mm vision bare metal stent).  Since then, his breathing has been much better.   he is s/p back surgery with profound anemia noted after the surgery. He was admitted to the hospital after routine blood work showed a HCT of 8. In the ER was noted to have hypotension. He had a transfusion, EGD, colonoscopy.   CT of the ABD did not show a bleed. Her is now back on ASA and plavix. He has received an iron infusion.   Overall he reports that he is doing well. He is helping to take care of several great-grandchildren. Knees and back continue to give him trouble. He has trouble getting up off the floor once he is down on the ground. Denies any chest pain, shortness of breath.  Previous CT scan showed abdominal aortic aneurysm 3.7 cm x 3.2 cm   EKG today shows sinus rhythm with rate 63 beats per minute with ST-wave abnormality in V4 through V6       Outpatient Encounter Prescriptions as of 08/12/2012  Medication Sig Dispense Refill  . albuterol (PROVENTIL HFA;VENTOLIN HFA) 108 (90 BASE) MCG/ACT inhaler Inhale 2 puffs into the lungs every 6 (six) hours as needed for wheezing.  1 Inhaler  0  . aspirin 81 MG tablet Take 1 tablet (81 mg total) by mouth daily.  30 tablet    . atorvastatin (LIPITOR) 20 MG tablet TAKE 1 TABLET BY  MOUTH DAILY  30 tablet  3  . budesonide-formoterol (SYMBICORT) 160-4.5 MCG/ACT inhaler Inhale 1 puff into the lungs daily.       . carvedilol (COREG) 12.5 MG tablet Take 6.25 mg by mouth 2 (two) times daily with a meal.       . clopidogrel (PLAVIX) 75 MG tablet TAKE ONE (1) TABLET EACH DAY  30 tablet  3  . Fluticasone-Salmeterol (ADVAIR) 250-50 MCG/DOSE AEPB Inhale 1 puff into the lungs 2 (two) times daily.      . methocarbamol (ROBAXIN) 500 MG tablet Take 1 tablet (500 mg total) by mouth 2 (two) times daily.  180 tablet  3  . omeprazole (PRILOSEC) 40 MG capsule TAKE ONE (1) CAPSULE EACH DAY  90 capsule  1  . polyethylene glycol powder (GLYCOLAX/MIRALAX) powder Take 17 g by mouth as needed.       . solifenacin (VESICARE) 5 MG tablet Take 10 mg by mouth daily.      [name] tiotropium (SPIRIVA) 18 MCG inhalation capsule Place 18 mcg into inhaler and inhale daily.      . traMADol (ULTRAM) 50 MG tablet Take 1 tablet (50 mg total) by mouth 4 (four) times daily as needed.  120 tablet  3  . [DISCONTINUED] furosemide (LASIX) 40 MG tablet Take 40 mg by mouth daily  as needed.       . [DISCONTINUED] potassium chloride SA (K-DUR,KLOR-CON) 20 MEQ tablet Take 20 mEq by mouth daily as needed.        No facility-administered encounter medications on file as of 08/12/2012.     Review of Systems  HENT: Negative.   Eyes: Negative.   Respiratory: Negative.   Cardiovascular: Negative.   Gastrointestinal: Positive for diarrhea.  Musculoskeletal: Negative.   Skin: Negative.   Neurological: Negative.   Psychiatric/Behavioral: Negative.   All other systems reviewed and are negative.    BP 130/72  Pulse 63  Ht 6' (1.829 m)  Wt 206 lb 8 oz (93.668 kg)  BMI 28 kg/m2  Physical Exam  Nursing note and vitals reviewed. Constitutional: He is oriented to person, place, and time. He appears well-developed and well-nourished.  HENT:  Head: Normocephalic.  Nose: Nose normal.  Mouth/Throat: Oropharynx is clear and  moist.  Eyes: Conjunctivae are normal. Pupils are equal, round, and reactive to light.  Neck: Normal range of motion. Neck supple. No JVD present.  Cardiovascular: Normal rate, regular rhythm, S1 normal, S2 normal, normal heart sounds and intact distal pulses.  Exam reveals no gallop and no friction rub.   No murmur heard. Pulmonary/Chest: Effort normal. No respiratory distress. He has decreased breath sounds. He has no wheezes. He has no rales. He exhibits no tenderness.  Abdominal: Soft. Bowel sounds are normal. He exhibits no distension. There is no tenderness.  Musculoskeletal: Normal range of motion. He exhibits no edema and no tenderness.  Lymphadenopathy:    He has no cervical adenopathy.  Neurological: He is alert and oriented to person, place, and time. Coordination normal.  Skin: Skin is warm and dry. No rash noted. No erythema.  Psychiatric: He has a normal mood and affect. His behavior is normal. Judgment and thought content normal.      Assessment and Plan        and

## 2012-08-12 NOTE — Assessment & Plan Note (Signed)
Cholesterol is at goal on the current lipid regimen. No changes to the medications were made.  

## 2012-08-12 NOTE — Patient Instructions (Addendum)
You are doing well. No medication changes were made.  We will check your blood work today  Please call us if you have new issues that need to be addressed before your next appt.  Your physician wants you to follow-up in: 6 months.  You will receive a reminder letter in the mail two months in advance. If you don't receive a letter, please call our office to schedule the follow-up appointment.   

## 2012-08-13 LAB — HEPATIC FUNCTION PANEL
ALT: 19 IU/L (ref 0–44)
AST: 22 IU/L (ref 0–40)
Alkaline Phosphatase: 128 IU/L — ABNORMAL HIGH (ref 39–117)
Bilirubin, Direct: 0.23 mg/dL (ref 0.00–0.40)
Total Bilirubin: 0.9 mg/dL (ref 0.0–1.2)

## 2012-08-13 LAB — LIPID PANEL
Cholesterol, Total: 165 mg/dL (ref 100–199)
HDL: 41 mg/dL (ref >39)
Triglycerides: 100 mg/dL (ref 0–149)

## 2012-08-14 ENCOUNTER — Other Ambulatory Visit: Payer: Self-pay

## 2012-08-14 MED ORDER — ATORVASTATIN CALCIUM 40 MG PO TABS: 40.0000 mg | ORAL_TABLET | Freq: Every day | ORAL | Status: DC

## 2012-09-06 DIAGNOSIS — M25519 Pain in unspecified shoulder: Secondary | ICD-10-CM | POA: Diagnosis not present

## 2012-09-10 ENCOUNTER — Encounter: Payer: Self-pay | Admitting: Internal Medicine

## 2012-09-10 ENCOUNTER — Ambulatory Visit (INDEPENDENT_AMBULATORY_CARE_PROVIDER_SITE_OTHER): Payer: Medicare Other | Admitting: Internal Medicine

## 2012-09-10 VITALS — BP 110/70 | HR 83 | Temp 98.3°F | Ht 72.0 in | Wt 203.0 lb

## 2012-09-10 DIAGNOSIS — I10 Essential (primary) hypertension: Secondary | ICD-10-CM

## 2012-09-10 NOTE — Patient Instructions (Addendum)
Use the symbicort twice a day as directed.  Use the albuterol inhaler as directed.  Mucinex in the am and Robitussin in the evening.  Saline nasal spray - flush nose at least 2-3x/day.  I am going to give you a prescription for an antibiotic if you do not improve.

## 2012-09-10 NOTE — Assessment & Plan Note (Signed)
Blood pressure controlled.  Follow.    

## 2012-09-10 NOTE — Progress Notes (Signed)
Subjective:    Patient ID: [name], male    DOB: 04/01/1946, 67 y.o.   MRN: [id number]  HPI 67 year old male with past history of COPD, GERD, hypertension and hypercholesterolemia who comes in today as a work in with concerns regarding increased cough and congestion.  States symptoms started over the last two days.  Increased post nasal drainage and some irritation of his throat.  Increased cough.  Some sinus pressure - minimal.  No earache.  No chest tightness.  No increased sob.  No vomiting or diarrhea.     Past Medical History  Diagnosis Date  . Coronary artery disease   . COPD (chronic obstructive pulmonary disease)   . Hypertension   . Hyperlipidemia   . History of kidney stones   . History of prostate cancer   . MI (myocardial infarction) 03/2001  . Neuromuscular disorder     Current Outpatient Prescriptions on File Prior to Visit  Medication Sig Dispense Refill  . albuterol (PROVENTIL HFA;VENTOLIN HFA) 108 (90 BASE) MCG/ACT inhaler Inhale 2 puffs into the lungs every 6 (six) hours as needed for wheezing.  1 Inhaler  0  . aspirin 81 MG tablet Take 1 tablet (81 mg total) by mouth daily.  30 tablet    . atorvastatin (LIPITOR) 40 MG tablet Take 1 tablet (40 mg total) by mouth daily.  90 tablet  3  . budesonide-formoterol (SYMBICORT) 160-4.5 MCG/ACT inhaler Inhale 1 puff into the lungs daily.       . carvedilol (COREG) 12.5 MG tablet Take 6.25 mg by mouth 2 (two) times daily with a meal.       . clopidogrel (PLAVIX) 75 MG tablet TAKE ONE (1) TABLET EACH DAY  30 tablet  3  . Fluticasone-Salmeterol (ADVAIR) 250-50 MCG/DOSE AEPB Inhale 1 puff into the lungs 2 (two) times daily.      . methocarbamol (ROBAXIN) 500 MG tablet Take 1 tablet (500 mg total) by mouth 2 (two) times daily.  180 tablet  3  . omeprazole (PRILOSEC) 40 MG capsule TAKE ONE (1) CAPSULE EACH DAY  90 capsule  1  . polyethylene glycol powder (GLYCOLAX/MIRALAX) powder Take 17 g by mouth as needed.       .  solifenacin (VESICARE) 5 MG tablet Take 10 mg by mouth daily.      [name] tiotropium (SPIRIVA) 18 MCG inhalation capsule Place 18 mcg into inhaler and inhale daily.      . traMADol (ULTRAM) 50 MG tablet Take 1 tablet (50 mg total) by mouth 4 (four) times daily as needed.  120 tablet  3   No current facility-administered medications on file prior to visit.    Review of Systems Patient denies any headache, lightheadedness or dizziness.  Some minimal sinus pressure. No significant nasal congestion.  Some increased drainage with some irritation of his throat.  No chest pain, tightness or palpitations.  No increased shortness of breath.  Does report increased cough and congestion.  Used his inhaler this am.  Does not use regularly.   No nausea or vomiting.  No abdominal pain or cramping.  No bowel change, such as diarrhea.        Objective:   Physical Exam Filed Vitals:   09/10/12 1456  BP: 110/70  Pulse: 83  Temp: 98.3 F (22.41 C)   67 year old male in no acute distress.  HEENT:  Nares - slightly erythematous turbinates.  Oropharynx - without lesions.  No significant tenderness to palpation over  the sinuses.  TMs visualized - without erythema.   NECK:  Supple.  Nontender.  HEART:  Appears to be regular.   LUNGS:  No crackles or wheezing audible.  Respirations even and unlabored.  Some increased cough with forced expiration.  RADIAL PULSE:  Equal bilaterally.     Assessment & Plan:  PROBABLE URI.  Will get him back on the Symbicort bid as directed.  Albuterol prn.  Mucinex in the am and Robitussin in the evening.  Saline nasal spray as directed.  Try a trial with this regimen and see if symptoms improve.  Gave him a rx for amoxicillin 875mg  to have if symptoms do not improve or worsen.  Follow closely.  He is to be reevaluated if symptoms change, worsen or do not resolve.

## 2012-09-11 DIAGNOSIS — L578 Other skin changes due to chronic exposure to nonionizing radiation: Secondary | ICD-10-CM | POA: Diagnosis not present

## 2012-09-11 DIAGNOSIS — L219 Seborrheic dermatitis, unspecified: Secondary | ICD-10-CM | POA: Diagnosis not present

## 2012-09-11 DIAGNOSIS — L723 Sebaceous cyst: Secondary | ICD-10-CM | POA: Diagnosis not present

## 2012-09-11 DIAGNOSIS — L905 Scar conditions and fibrosis of skin: Secondary | ICD-10-CM | POA: Diagnosis not present

## 2012-09-11 DIAGNOSIS — M25519 Pain in unspecified shoulder: Secondary | ICD-10-CM | POA: Diagnosis not present

## 2012-09-13 DIAGNOSIS — M25519 Pain in unspecified shoulder: Secondary | ICD-10-CM | POA: Diagnosis not present

## 2012-09-20 DIAGNOSIS — M19019 Primary osteoarthritis, unspecified shoulder: Secondary | ICD-10-CM | POA: Diagnosis not present

## 2012-09-24 DIAGNOSIS — M4716 Other spondylosis with myelopathy, lumbar region: Secondary | ICD-10-CM | POA: Diagnosis not present

## 2012-11-05 ENCOUNTER — Other Ambulatory Visit: Payer: Self-pay | Admitting: *Deleted

## 2012-11-05 MED ORDER — CLOPIDOGREL BISULFATE 75 MG PO TABS: ORAL_TABLET | ORAL | Status: DC

## 2012-11-19 ENCOUNTER — Telehealth: Payer: Self-pay | Admitting: *Deleted

## 2012-11-19 DIAGNOSIS — R911 Solitary pulmonary nodule: Secondary | ICD-10-CM

## 2012-11-19 NOTE — Telephone Encounter (Signed)
Spoke with pt's spouse and made aware ct due next mo She verbalized understanding Order was sent to [facility]

## 2012-11-19 NOTE — Telephone Encounter (Signed)
Message copied by Christen Butter on Tue Nov 19, 2012 11:08 AM ------      Message from: Christen Butter      Created: Tue Jul 02, 2012  4:30 PM       Needs non con ct chest f/u pulm nodule in Sept 2015 ------

## 2012-11-20 ENCOUNTER — Other Ambulatory Visit: Payer: Self-pay

## 2013-01-10 DIAGNOSIS — M4716 Other spondylosis with myelopathy, lumbar region: Secondary | ICD-10-CM | POA: Diagnosis not present

## 2013-01-16 ENCOUNTER — Telehealth: Payer: Self-pay | Admitting: Pulmonary Disease

## 2013-01-16 DIAGNOSIS — R911 Solitary pulmonary nodule: Secondary | ICD-10-CM

## 2013-01-16 NOTE — Telephone Encounter (Signed)
Order has been placed for the pt to have the ct scan done at [facility].  Nothing further is needed. Pt is aware.

## 2013-01-20 ENCOUNTER — Ambulatory Visit: Payer: Self-pay | Admitting: Pulmonary Disease

## 2013-01-20 DIAGNOSIS — R911 Solitary pulmonary nodule: Secondary | ICD-10-CM | POA: Diagnosis not present

## 2013-01-20 DIAGNOSIS — J984 Other disorders of lung: Secondary | ICD-10-CM | POA: Diagnosis not present

## 2013-01-21 ENCOUNTER — Encounter: Payer: Self-pay | Admitting: Pulmonary Disease

## 2013-01-21 ENCOUNTER — Telehealth: Payer: Self-pay | Admitting: *Deleted

## 2013-01-21 NOTE — Telephone Encounter (Signed)
Pts wife is aware of results.

## 2013-01-21 NOTE — Telephone Encounter (Signed)
Message copied by Caryl Ada on Tue Jan 21, 2013  1:54 PM ------      Message from: Max Fickle B      Created: Tue Jan 21, 2013  1:15 PM       L,            Please let him know that his CT chest showed that the nodule was unchanged.            Thanks,      B ------

## 2013-01-28 ENCOUNTER — Encounter: Payer: Self-pay | Admitting: Pulmonary Disease

## 2013-01-28 ENCOUNTER — Ambulatory Visit (INDEPENDENT_AMBULATORY_CARE_PROVIDER_SITE_OTHER): Payer: Medicare Other | Admitting: Pulmonary Disease

## 2013-01-28 VITALS — BP 122/70 | HR 66 | Temp 98.5°F | Ht 73.0 in | Wt 210.0 lb

## 2013-01-28 DIAGNOSIS — R911 Solitary pulmonary nodule: Secondary | ICD-10-CM

## 2013-01-28 DIAGNOSIS — Z23 Encounter for immunization: Secondary | ICD-10-CM | POA: Diagnosis not present

## 2013-01-28 DIAGNOSIS — J449 Chronic obstructive pulmonary disease, unspecified: Secondary | ICD-10-CM

## 2013-01-28 NOTE — Assessment & Plan Note (Signed)
Fortunately this has not changed since the last study. He is not need another CT scan of his chest until June 2015 which will be the last one that we need.

## 2013-01-28 NOTE — Patient Instructions (Signed)
We will order a CT scan of your chest for June 2015 and see you after that Keep taking your inhalers as you are doing.

## 2013-01-28 NOTE — Assessment & Plan Note (Addendum)
This has been a stable interval for [facility]. He will have a flu shot today. He is to continue taking the Spiriva daily.

## 2013-01-28 NOTE — Progress Notes (Signed)
Subjective:    Patient ID: [name], male    DOB: [birth year], 67 y.o.   MRN: [id number]  Synopsis: [name] first saw the [place] [facility] in July 2013 for a pulmonary nodule. He has a greater than 50-pack-year smoking history and quit in 2012. He was found on his initial visit to have gold stage II COPD but has minimal symptoms. The nodule was 6 mm and located in the left lower lobe there is also a 6 mm pleural-based nodule seen in the right middle lobe.  HPI   01/15/12 ROV --  Since his last visit [name] is been doing very well. He had a bout of tonsillitis and bronchitis apparently several weeks ago which was treated with Augmentin and associated with diarrhea. The diarrhea apparently lasted for quite some time but is now resolved. He states that he really didn't have much shortness of breath with this and he has not been feeling more short of breath or having cough since then. He went for a CT scan at [facility] on 01/10/2012.pmh  07/02/2012 ROV -- Since our last visit [name] has been doing well.  He had to use his inhaler once last week after he spent the day cleaning trees up in his yard, but other than that he really hasn't had to use it.  He has not been short of breath and cough is minimal.  He gained 12 lbs since the last visit.  His daughter has lung cancer and this has been bothering them some, but otherwise he is doing OK.  01/28/2013 routine office visit> [name] has been doing very well since the last visit. He has not had any trouble with shortness of breath. He continues to take his Spiriva primarily on as needed basis. He has not had weight loss, fevers, chills or chest pain. He continues to take care of his great granddaughter every day.  Past Medical History  Diagnosis Date  . Coronary artery disease   . COPD (chronic obstructive pulmonary disease)   . Hypertension   . Hyperlipidemia   . History of kidney stones   . History of prostate cancer   . MI (myocardial  infarction) 03/2001  . Neuromuscular disorder      Review of Systems  Constitutional: Negative for fever, chills and fatigue.  HENT: Negative for congestion, rhinorrhea, sinus pressure and sneezing.   Respiratory: Negative for cough, shortness of breath and wheezing.   Cardiovascular: Negative for chest pain, palpitations and leg swelling.      Objective:   Physical Exam   Filed Vitals:   01/28/13 1636  BP: 122/70  Pulse: 66  Temp: 98.5 F (36.9 C)  TempSrc: Oral  Height: 6\' 1"  (1.854 m)  Weight: 210 lb (95.255 kg)  SpO2: 93%   Gen: well appearing, no acute distress HEENT: NCAT, EOMi, OP clear,  PULM: Few fine inspiratory crackles throughout CV: RRR, no mgr, no JVD AB: BS+, soft, nontender, no hsm Ext: warm, no edema, no clubbing, no cyanosis  10/09/2011 CT chest [facility]: 6 mm round smooth solid pulmonary nodule in the left lower lobe 01/10/2012 CT chest [facility]: 6 mm nodule in LLL stable, stable 5.8 cm pleural based lesion RML 06/25/2012 CT Chest [facility] > 6mm nodule in LLL unchanged, scarring in lingula likely retained mucus     Assessment & Plan:   Pulmonary nodule Fortunately this has not changed since the last study. He is not need another CT scan of his chest until June 2015 which  will be the last one that we need.  COPD (chronic obstructive pulmonary disease) This has been a stable interval for [facility]. He will have a flu shot today. He is to continue taking the Spiriva daily.    Updated Medication List Outpatient Encounter Prescriptions as of 01/28/2013  Medication Sig Dispense Refill  . aspirin 81 MG tablet Take 1 tablet (81 mg total) by mouth daily.  30 tablet    . atorvastatin (LIPITOR) 40 MG tablet Take 1 tablet (40 mg total) by mouth daily.  90 tablet  3  . carvedilol (COREG) 12.5 MG tablet Take 6.25 mg by mouth 2 (two) times daily with a meal.       . clopidogrel (PLAVIX) 75 MG tablet TAKE ONE (1) TABLET EACH DAY  30 tablet  3  . methocarbamol (ROBAXIN) 500 MG  tablet Take 1 tablet (500 mg total) by mouth 2 (two) times daily.  180 tablet  3  . omeprazole (PRILOSEC) 40 MG capsule TAKE ONE (1) CAPSULE EACH DAY  90 capsule  1  . polyethylene glycol powder (GLYCOLAX/MIRALAX) powder Take 17 g by mouth as needed.       . solifenacin (VESICARE) 5 MG tablet Take 10 mg by mouth daily.      [name] tiotropium (SPIRIVA) 18 MCG inhalation capsule Place 18 mcg into inhaler and inhale daily.      . traMADol (ULTRAM) 50 MG tablet Take 1 tablet (50 mg total) by mouth 4 (four) times daily as needed.  120 tablet  3  . [DISCONTINUED] budesonide-formoterol (SYMBICORT) 160-4.5 MCG/ACT inhaler Inhale 1 puff into the lungs daily.       . [DISCONTINUED] Fluticasone-Salmeterol (ADVAIR) 250-50 MCG/DOSE AEPB Inhale 1 puff into the lungs 2 (two) times daily.      [name] albuterol (PROVENTIL HFA;VENTOLIN HFA) 108 (90 BASE) MCG/ACT inhaler Inhale 2 puffs into the lungs every 6 (six) hours as needed for wheezing.  1 Inhaler  0   No facility-administered encounter medications on file as of 01/28/2013.

## 2013-02-18 DIAGNOSIS — H25019 Cortical age-related cataract, unspecified eye: Secondary | ICD-10-CM | POA: Diagnosis not present

## 2013-02-24 ENCOUNTER — Encounter: Payer: Self-pay | Admitting: Cardiovascular Disease

## 2013-02-24 ENCOUNTER — Ambulatory Visit (INDEPENDENT_AMBULATORY_CARE_PROVIDER_SITE_OTHER): Payer: Medicare Other | Admitting: Cardiovascular Disease

## 2013-02-24 VITALS — BP 132/80 | HR 63 | Ht 72.0 in | Wt 210.0 lb

## 2013-02-24 DIAGNOSIS — Z716 Tobacco abuse counseling: Secondary | ICD-10-CM

## 2013-02-24 DIAGNOSIS — F17200 Nicotine dependence, unspecified, uncomplicated: Secondary | ICD-10-CM

## 2013-02-24 DIAGNOSIS — I10 Essential (primary) hypertension: Secondary | ICD-10-CM

## 2013-02-24 DIAGNOSIS — Z7189 Other specified counseling: Secondary | ICD-10-CM

## 2013-02-24 DIAGNOSIS — I2510 Atherosclerotic heart disease of native coronary artery without angina pectoris: Secondary | ICD-10-CM

## 2013-02-24 DIAGNOSIS — E785 Hyperlipidemia, unspecified: Secondary | ICD-10-CM

## 2013-02-24 NOTE — Assessment & Plan Note (Signed)
Currently with no symptoms of angina. No further workup at this time. Continue current medication regimen. 

## 2013-02-24 NOTE — Progress Notes (Signed)
Patient ID: [name], male    DOB: Nov 10, 1945, 67 y.o.   MRN: [id number]  HPI Comments: 67 years old with CAD,  posterior MI in 2002 treated with a bare-metal stent to the circumflex artery,  back surgery by Dr. Noel Gerold x2 In 2009 and 2010, and he had a negative Myoview scan at that time with an ejection fraction of 60% (2010).He had another preop evaluation in February 2011 prior to prostate biopsy and he ended up having a seed implantation. abnormal EKG noted on office visit in 2012,  He had a stress test that showed anterior wall ischemia. Cardiac catheterization was performed on January 05 2011 that showed 95% proximal LAD disease. A bare metal stent was placed ( 3.5 x 18 mm vision bare metal stent).   he is s/p back surgery with profound anemia noted after the surgery. He was admitted to the hospital after routine blood work showed a HCT of 8. In the ER was noted to have hypotension. He had a transfusion, EGD, colonoscopy.   CT of the ABD did not show a bleed. He has received an iron infusion.   Overall he reports that he is doing well. He is helping to take care of several great-grandchildren. Knees and back continue to give him trouble. Denies any chest pain, shortness of breath. He is scheduled to have left shoulder surgery for possible rotator cuff tear. Scheduled to have MRI in the near future if needed, surgery. He seen by [facility] orthopedics, Dr. Thomasena Edis. Overall no new complaints today  Previous CT scan showed abdominal aortic aneurysm 3.7 cm x 3.2 cm   EKG today shows sinus rhythm with rate 63 beats per minute with ST-wave abnormality in V4 through V6, II, III, AVF       Outpatient Encounter Prescriptions as of 02/24/2013  Medication Sig  . albuterol (PROVENTIL HFA;VENTOLIN HFA) 108 (90 BASE) MCG/ACT inhaler Inhale 2 puffs into the lungs every 6 (six) hours as needed for wheezing.  [name] aspirin 81 MG tablet Take 1 tablet (81 mg total) by mouth daily.  [name] atorvastatin (LIPITOR)  40 MG tablet Take 1 tablet (40 mg total) by mouth daily.  . clopidogrel (PLAVIX) 75 MG tablet TAKE ONE (1) TABLET EACH DAY  . methocarbamol (ROBAXIN) 500 MG tablet Take 1 tablet (500 mg total) by mouth 2 (two) times daily.  [name] omeprazole (PRILOSEC) 40 MG capsule TAKE ONE (1) CAPSULE EACH DAY  . polyethylene glycol powder (GLYCOLAX/MIRALAX) powder Take 17 g by mouth as needed.   . solifenacin (VESICARE) 5 MG tablet Take 10 mg by mouth daily.  [name] tiotropium (SPIRIVA) 18 MCG inhalation capsule Place 18 mcg into inhaler and inhale daily.  . traMADol (ULTRAM) 50 MG tablet Take 1 tablet (50 mg total) by mouth 4 (four) times daily as needed.  . [DISCONTINUED] carvedilol (COREG) 12.5 MG tablet Take 6.25 mg by mouth 2 (two) times daily with a meal.      Review of Systems  Constitutional: Negative.   HENT: Negative.   Eyes: Negative.   Respiratory: Negative.   Cardiovascular: Negative.   Endocrine: Negative.   Musculoskeletal: Positive for arthralgias.       Left shoulder pain  Skin: Negative.   Allergic/Immunologic: Negative.   Neurological: Negative.   Hematological: Negative.   Psychiatric/Behavioral: Negative.   All other systems reviewed and are negative.    BP 132/80  Pulse 63  Ht 6' (1.829 m)  Wt 210 lb (95.255 kg)  BMI 28.47 kg/m2  Physical Exam  Nursing note and vitals reviewed. Constitutional: He is oriented to person, place, and time. He appears well-developed and well-nourished.  HENT:  Head: Normocephalic.  Nose: Nose normal.  Mouth/Throat: Oropharynx is clear and moist.  Eyes: Conjunctivae are normal. Pupils are equal, round, and reactive to light.  Neck: Normal range of motion. Neck supple. No JVD present.  Cardiovascular: Normal rate, regular rhythm, S1 normal, S2 normal, normal heart sounds and intact distal pulses.  Exam reveals no gallop and no friction rub.   No murmur heard. Pulmonary/Chest: Effort normal. No respiratory distress. He has decreased breath  sounds. He has no wheezes. He has no rales. He exhibits no tenderness.  Abdominal: Soft. Bowel sounds are normal. He exhibits no distension. There is no tenderness.  Musculoskeletal: Normal range of motion. He exhibits no edema and no tenderness.  Lymphadenopathy:    He has no cervical adenopathy.  Neurological: He is alert and oriented to person, place, and time. Coordination normal.  Skin: Skin is warm and dry. No rash noted. No erythema.  Psychiatric: He has a normal mood and affect. His behavior is normal. Judgment and thought content normal.      Assessment and Plan        and

## 2013-02-24 NOTE — Patient Instructions (Signed)
You are doing well. No medication changes were made.  Please come in for lab work in the Am, fasting  Please call us if you have new issues that need to be addressed before your next appt.  Your physician wants you to follow-up in: 12 months.  You will receive a reminder letter in the mail two months in advance. If you don't receive a letter, please call our office to schedule the follow-up appointment.

## 2013-02-24 NOTE — Assessment & Plan Note (Signed)
Cholesterol is at goal on the current lipid regimen. No changes to the medications were made.  

## 2013-02-24 NOTE — Assessment & Plan Note (Signed)
He stopped smoking 2 years ago.

## 2013-02-24 NOTE — Assessment & Plan Note (Signed)
Blood pressure is well controlled on today's visit. No changes made to the medications. 

## 2013-02-26 ENCOUNTER — Encounter: Payer: Self-pay | Admitting: Pulmonary Disease

## 2013-03-03 ENCOUNTER — Encounter: Payer: Self-pay | Admitting: Internal Medicine

## 2013-03-03 ENCOUNTER — Telehealth: Payer: Self-pay | Admitting: Internal Medicine

## 2013-03-03 ENCOUNTER — Ambulatory Visit (INDEPENDENT_AMBULATORY_CARE_PROVIDER_SITE_OTHER): Payer: Medicare Other | Admitting: Internal Medicine

## 2013-03-03 VITALS — BP 140/82 | HR 81 | Temp 98.7°F | Resp 14 | Wt 210.0 lb

## 2013-03-03 DIAGNOSIS — J069 Acute upper respiratory infection, unspecified: Secondary | ICD-10-CM | POA: Diagnosis not present

## 2013-03-03 MED ORDER — PREDNISONE (PAK) 10 MG PO TABS: ORAL_TABLET | ORAL | Status: DC

## 2013-03-03 MED ORDER — DOXYCYCLINE HYCLATE 100 MG PO TBEC: 100.0000 mg | DELAYED_RELEASE_TABLET | Freq: Two times a day (BID) | ORAL | Status: DC

## 2013-03-03 MED ORDER — CULTURELLE DIGESTIVE HEALTH PO CAPS: 1.0000 | ORAL_CAPSULE | Freq: Every day | ORAL | Status: DC

## 2013-03-03 NOTE — Telephone Encounter (Signed)
Patient Information:  Caller Name: Kiyaan  Phone: [phone]  Patient: [name], [name]  Gender: Male  DOB: 1945/12/28  Age: 67 Years  PCP: Duncan Dull (Adults only)  Office Follow Up:  Does the office need to follow up with this patient?: Yes  Instructions For The Office: No appointments available at office this date; patient declines appointment at another office.  He reports he has appointment on 03/05/13 with Dr. Darrick Huntsman.   Symptoms  Reason For Call & Symptoms: Patient callling about cough and post nasal drip.  Light green sputum reported.  Emergent symptoms ruled out.  See Today in Office per Cough protocol due to Known COPD or other severe lung disease and worsening symptoms.  Reviewed Health History In EMR: Yes  Reviewed Medications In EMR: Yes  Reviewed Allergies In EMR: Yes  Reviewed Surgeries / Procedures: Yes  Date of Onset of Symptoms: 03/01/2013  Treatments Tried: Tessalon Perles  Treatments Tried Worked: Yes  Guideline(s) Used:  Cough  Disposition Per Guideline:   See Today in Office  Reason For Disposition Reached:   Known COPD or other severe lung disease (i.e., bronchiectasis, cystic fibrosis, lung surgery) and worsening symptoms (i.e., increased sputum purulence or amount, increased breathing difficulty)  Advice Given:  Reassurance  Coughing is the way that our lungs remove irritants and mucus. It helps protect our lungs from getting pneumonia.  Here is some care advice that should help.  Coughing Spasms:  Drink warm fluids. Inhale warm mist (Reason: both relax the airway and loosen up the phlegm).  Suck on cough drops or hard candy to coat the irritated throat.  Prevent Dehydration:  Drink adequate liquids.  This will help soothe an irritated or dry throat and loosen up the phlegm.  Avoid Tobacco Smoke:  Smoking or being exposed to smoke makes coughs much worse.  Call Back If:  Difficulty breathing  Fever lasts > 3 days  You become worse.  Patient  Will Follow Care Advice:  YES

## 2013-03-03 NOTE — Telephone Encounter (Signed)
i have 2 pm and 4:30 pm today availabe to see [name]

## 2013-03-03 NOTE — Patient Instructions (Addendum)
You have a mild case of bronchitis Stop the augmentin.  It is not necessary  Take the doxycycline twice daily with food for 7 days  Please take a probiotic ( Align, Floraque or Culturelle) for at least two weesk while you are on the antibiotic to prevent a serious antibiotic associated colitis (diarrhea)   Called clostirudium dificile colitis

## 2013-03-03 NOTE — Progress Notes (Signed)
Pre-visit discussion using our clinic review tool. No additional management support is needed unless otherwise documented below in the visit note.  

## 2013-03-03 NOTE — Telephone Encounter (Signed)
Appt 03/05/13

## 2013-03-03 NOTE — Telephone Encounter (Signed)
Per Zella Ball Patient scheduled for 4.30

## 2013-03-03 NOTE — Progress Notes (Signed)
Patient ID: [name], male   DOB: 05-Apr-1946, 67 y.o.   MRN: [id number]   Patient Active Problem List   Diagnosis Date Noted  . URI, acute 03/11/2012  . Acute blood loss anemia 11/20/2011  . COPD (chronic obstructive pulmonary disease) 11/06/2011  . Pulmonary nodule 11/06/2011  . Weight loss, unintentional 10/04/2011  . Tobacco abuse counseling 02/12/2011  . CAD, NATIVE VESSEL 10/18/2008  . COLONIC POLYPS 08/05/2008  . HYPERLIPIDEMIA 08/05/2008  . HYPERTENSION 08/05/2008  . GERD 08/05/2008    Subjective:  CC:   Chief Complaint  Patient presents with  . Acute Visit    head and chest cold.    HPI:   [name] a 67 y.o. male who presents with a 2 day history of persistent cough which improved when he started taking  Some leftover  augmentin,  At 1/2 tablet twice daily Feels 100% better,  Nose not running, cough has improved.  Took last dose of augmentin this morning. Denies wheezing,  Green sputum.  Having shoulder surgery soon but date not set yet.     Past Medical History  Diagnosis Date  . Coronary artery disease   . COPD (chronic obstructive pulmonary disease)   . Hypertension   . Hyperlipidemia   . History of kidney stones   . History of prostate cancer   . MI (myocardial infarction) 03/2001  . Neuromuscular disorder     Past Surgical History  Procedure Laterality Date  . Prostate surgery  09/2009    prostate implant  . Back surgery  2009/2010    x 2  . Hernia repair      bilateral   . Foot surgery      bilateral   . Knee surgery      right knee   . Cardiac catheterization  2002    stent placement [place]  . Spine surgery         The following portions of the patient's history were reviewed and updated as appropriate: Allergies, current medications, and problem list.    Review of Systems:   12 Pt  review of systems was negative except those addressed in the HPI,     History   Social History  . Marital Status: Married   Spouse Name: N/A    Number of Children: N/A  . Years of Education: N/A   Occupational History  . Not on file.   Social History Main Topics  . Smoking status: Former Smoker -- 1.00 packs/day    Types: Cigarettes    Quit date: 02/10/2011  . Smokeless tobacco: Never Used  . Alcohol Use: No  . Drug Use: No  . Sexual Activity: Not on file   Other Topics Concern  . Not on file   Social History Narrative  . No narrative on file    Objective:  Filed Vitals:   03/03/13 1627  BP: 140/82  Pulse: 81  Temp: 98.7 F (37.1 C)  Resp: 14     General appearance: alert, cooperative and appears stated age Ears: normal TM's and external ear canals both ears Throat: lips, mucosa, and tongue normal; teeth and gums normal Neck: no adenopathy, no carotid bruit, supple, symmetrical, trachea midline and thyroid not enlarged, symmetric, no tenderness/mass/nodules Back: symmetric, no curvature. ROM normal. No CVA tenderness. Lungs: clear to auscultation bilaterally Heart: regular rate and rhythm, S1, S2 normal, no murmur, click, rub or gallop Abdomen: soft, non-tender; bowel sounds normal; no masses,  no organomegaly Pulses: 2+  and symmetric Skin: Skin color, texture, turgor normal. No rashes or lesions Lymph nodes: Cervical, supraclavicular, and axillary nodes normal.  Assessment and Plan:  URI, acute avoiding augment due to prior diarrheal episode.  Doxy,  Probiotic,     Updated Medication List Outpatient Encounter Prescriptions as of 03/03/2013  Medication Sig  . aspirin 81 MG tablet Take 1 tablet (81 mg total) by mouth daily.  [name] atorvastatin (LIPITOR) 40 MG tablet Take 1 tablet (40 mg total) by mouth daily.  . clopidogrel (PLAVIX) 75 MG tablet TAKE ONE (1) TABLET EACH DAY  . methocarbamol (ROBAXIN) 500 MG tablet Take 1 tablet (500 mg total) by mouth 2 (two) times daily.  [name] omeprazole (PRILOSEC) 40 MG capsule TAKE ONE (1) CAPSULE EACH DAY  . polyethylene glycol powder  (GLYCOLAX/MIRALAX) powder Take 17 g by mouth as needed.   . solifenacin (VESICARE) 5 MG tablet Take 10 mg by mouth daily.  [name] tiotropium (SPIRIVA) 18 MCG inhalation capsule Place 18 mcg into inhaler and inhale daily.  . traMADol (ULTRAM) 50 MG tablet Take 1 tablet (50 mg total) by mouth 4 (four) times daily as needed.  [name] albuterol (PROVENTIL HFA;VENTOLIN HFA) 108 (90 BASE) MCG/ACT inhaler Inhale 2 puffs into the lungs every 6 (six) hours as needed for wheezing.  [name] doxycycline (DORYX) 100 MG EC tablet Take 1 tablet (100 mg total) by mouth 2 (two) times daily.  . Lactobacillus-Inulin (CULTURELLE DIGESTIVE HEALTH) CAPS Take 1 capsule by mouth daily.  . [DISCONTINUED] predniSONE (STERAPRED UNI-PAK) 10 MG tablet 6 tablets on Day 1 , then reduce by 1 tablet daily until gone     No orders of the defined types were placed in this encounter.    No Follow-up on file.

## 2013-03-04 ENCOUNTER — Telehealth: Payer: Self-pay | Admitting: *Deleted

## 2013-03-04 ENCOUNTER — Other Ambulatory Visit: Payer: Self-pay

## 2013-03-04 ENCOUNTER — Ambulatory Visit (INDEPENDENT_AMBULATORY_CARE_PROVIDER_SITE_OTHER): Payer: Medicare Other | Admitting: *Deleted

## 2013-03-04 DIAGNOSIS — E785 Hyperlipidemia, unspecified: Secondary | ICD-10-CM | POA: Diagnosis not present

## 2013-03-04 DIAGNOSIS — I2510 Atherosclerotic heart disease of native coronary artery without angina pectoris: Secondary | ICD-10-CM | POA: Diagnosis not present

## 2013-03-04 MED ORDER — CLOPIDOGREL BISULFATE 75 MG PO TABS: ORAL_TABLET | ORAL | Status: DC

## 2013-03-04 NOTE — Telephone Encounter (Signed)
Yes, nonenteric coated doxycycline is fine

## 2013-03-04 NOTE — Telephone Encounter (Signed)
Refill sent for plavix  

## 2013-03-04 NOTE — Assessment & Plan Note (Signed)
avoiding augment due to prior diarrheal episode.  Doxy,  Probiotic,

## 2013-03-04 NOTE — Telephone Encounter (Signed)
Pharmacy does not have enteric coated doxycycline so please advise as to okay to use non enteric coated?

## 2013-03-05 ENCOUNTER — Ambulatory Visit: Payer: Medicare Other | Admitting: Internal Medicine

## 2013-03-05 LAB — HEPATIC FUNCTION PANEL
ALT: 14 IU/L (ref 0–44)
Albumin: 4.1 g/dL (ref 3.6–4.8)
Alkaline Phosphatase: 118 IU/L — ABNORMAL HIGH (ref 39–117)
Bilirubin, Direct: 0.17 mg/dL (ref 0.00–0.40)
Total Bilirubin: 0.6 mg/dL (ref 0.0–1.2)
Total Protein: 6.6 g/dL (ref 6.0–8.5)

## 2013-03-05 LAB — LIPID PANEL
Cholesterol, Total: 154 mg/dL (ref 100–199)
VLDL Cholesterol Cal: 27 mg/dL (ref 5–40)

## 2013-03-05 NOTE — Telephone Encounter (Signed)
Pharmacy notified.

## 2013-03-07 DIAGNOSIS — S43429A Sprain of unspecified rotator cuff capsule, initial encounter: Secondary | ICD-10-CM | POA: Diagnosis not present

## 2013-03-10 ENCOUNTER — Telehealth: Payer: Self-pay

## 2013-03-10 NOTE — Telephone Encounter (Signed)
Spoke w/ pt's wife.  She will make him aware of results.  

## 2013-03-10 NOTE — Telephone Encounter (Signed)
Message copied by Marilynne Halsted on Mon Mar 10, 2013 10:47 AM ------      Message from: Antonieta Iba      Created: Sun Mar 09, 2013  9:56 PM       Cholesterol  Better than last year      Goal is <150      we are at 154      Goal LDL <70, we are 34      Watch the diet!!      Recheck in one year      stay on same meds if diet improves ------

## 2013-03-18 DIAGNOSIS — M67919 Unspecified disorder of synovium and tendon, unspecified shoulder: Secondary | ICD-10-CM | POA: Diagnosis not present

## 2013-03-18 DIAGNOSIS — S43439A Superior glenoid labrum lesion of unspecified shoulder, initial encounter: Secondary | ICD-10-CM | POA: Diagnosis not present

## 2013-03-18 DIAGNOSIS — S43499A Other sprain of unspecified shoulder joint, initial encounter: Secondary | ICD-10-CM | POA: Diagnosis not present

## 2013-03-18 DIAGNOSIS — M24119 Other articular cartilage disorders, unspecified shoulder: Secondary | ICD-10-CM | POA: Diagnosis not present

## 2013-03-18 DIAGNOSIS — M6688 Spontaneous rupture of other tendons, other: Secondary | ICD-10-CM | POA: Diagnosis not present

## 2013-03-18 DIAGNOSIS — M19019 Primary osteoarthritis, unspecified shoulder: Secondary | ICD-10-CM | POA: Diagnosis not present

## 2013-03-18 DIAGNOSIS — G8918 Other acute postprocedural pain: Secondary | ICD-10-CM | POA: Diagnosis not present

## 2013-03-24 DIAGNOSIS — M25519 Pain in unspecified shoulder: Secondary | ICD-10-CM | POA: Diagnosis not present

## 2013-03-26 DIAGNOSIS — M19019 Primary osteoarthritis, unspecified shoulder: Secondary | ICD-10-CM | POA: Diagnosis not present

## 2013-03-26 DIAGNOSIS — Z4889 Encounter for other specified surgical aftercare: Secondary | ICD-10-CM | POA: Diagnosis not present

## 2013-03-28 DIAGNOSIS — M25519 Pain in unspecified shoulder: Secondary | ICD-10-CM | POA: Diagnosis not present

## 2013-04-01 ENCOUNTER — Encounter: Payer: Self-pay | Admitting: Internal Medicine

## 2013-04-01 ENCOUNTER — Ambulatory Visit (INDEPENDENT_AMBULATORY_CARE_PROVIDER_SITE_OTHER): Payer: Medicare Other | Admitting: Internal Medicine

## 2013-04-01 VITALS — BP 138/74 | HR 76 | Temp 98.7°F | Resp 12 | Wt 211.0 lb

## 2013-04-01 DIAGNOSIS — M25519 Pain in unspecified shoulder: Secondary | ICD-10-CM | POA: Diagnosis not present

## 2013-04-01 DIAGNOSIS — J069 Acute upper respiratory infection, unspecified: Secondary | ICD-10-CM

## 2013-04-01 MED ORDER — LEVOFLOXACIN 500 MG PO TABS: 500.0000 mg | ORAL_TABLET | Freq: Every day | ORAL | Status: DC

## 2013-04-01 MED ORDER — BENZONATATE 200 MG PO CAPS: 200.0000 mg | ORAL_CAPSULE | Freq: Three times a day (TID) | ORAL | Status: DC | PRN

## 2013-04-01 NOTE — Progress Notes (Signed)
Pre visit review using our clinic review tool, if applicable. No additional management support is needed unless otherwise documented below in the visit note. 

## 2013-04-01 NOTE — Patient Instructions (Addendum)
You have a sinusitis and COPD exacerbation.  I am prescribing an antibiotic (levaquin) and prednisone taper  To manage the infection   Please take a probiotic again for at least 2 weeks ( Align, [place] or [place]) while you are on the antibiotic to prevent a serious antibiotic associated diarrhea  Called clostirudium dificile colitis and a vaginal yeast infection   I also advise use of the following OTC meds to help with your other symptoms.   Take generic OTC benadryl 25 mg every 8 hours for the drainage,  Sudafed PE  10 to 30 mg every 8 hours for the congestion, you may substitute Afrin nasal spray for the nighttime dose of sudafed PE  If needed to prevent insomnia.  flushes your sinuses twice daily with Simply Saline (do over the sink because if you do it right you will spit out globs of mucus)  Use benzonatate capsules   FOR THE COUGH.  Gargle with salt water as needed for sore throat.

## 2013-04-01 NOTE — Progress Notes (Signed)
Patient ID: [name], male   DOB: [birth year], 67 y.o.   MRN: [id number]   Patient Active Problem List   Diagnosis Date Noted  . URI, acute 03/11/2012  . Acute blood loss anemia 11/20/2011  . COPD (chronic obstructive pulmonary disease) 11/06/2011  . Pulmonary nodule 11/06/2011  . Weight loss, unintentional 10/04/2011  . Tobacco abuse counseling 02/12/2011  . CAD, NATIVE VESSEL 10/18/2008  . COLONIC POLYPS 08/05/2008  . HYPERLIPIDEMIA 08/05/2008  . HYPERTENSION 08/05/2008  . GERD 08/05/2008    Subjective:  CC:   Chief Complaint  Patient presents with  . Cough    HPI:   [name] a 67 y.o. male who presents Respiratory and sinus Symptoms since Saturday,  Frontal headache  All day  geen nasaL dicsrage,  Cough worse at night, using albuterol inhaler prn.   Has  not tolerated prior trials of prednisone taper to agitation and systemic symptoms including chest pain.  he recently finished a course of keflex which was finished on  Dec 9th. This was prescribed postoperatively after having rotator cuff surgery by Dr. Thomasena Edis. He is still using Percocet for pain control. Using tessalon perles For for management of cough.    Past Medical History  Diagnosis Date  . Coronary artery disease   . COPD (chronic obstructive pulmonary disease)   . Hypertension   . Hyperlipidemia   . History of kidney stones   . History of prostate cancer   . MI (myocardial infarction) 03/2001  . Neuromuscular disorder     Past Surgical History  Procedure Laterality Date  . Prostate surgery  09/2009    prostate implant  . Back surgery  2009/2010    x 2  . Hernia repair      bilateral   . Foot surgery      bilateral   . Knee surgery      right knee   . Cardiac catheterization  2002    stent placement [place]  . Spine surgery         The following portions of the patient's history were reviewed and updated as appropriate: Allergies, current medications, and problem  list.    Review of Systems:   12 Pt  review of systems was negative except those addressed in the HPI,     History   Social History  . Marital Status: Married    Spouse Name: N/A    Number of Children: N/A  . Years of Education: N/A   Occupational History  . Not on file.   Social History Main Topics  . Smoking status: Former Smoker -- 1.00 packs/day    Types: Cigarettes    Quit date: 02/10/2011  . Smokeless tobacco: Never Used  . Alcohol Use: No  . Drug Use: No  . Sexual Activity: Not on file   Other Topics Concern  . Not on file   Social History Narrative  . No narrative on file    Objective:  Filed Vitals:   04/01/13 0912  BP: 138/74  Pulse: 76  Temp: 98.7 F (37.1 C)  Resp: 12     General appearance: alert, cooperative and appears stated age Ears: normal TM's and external ear canals both ears Throat: lips, mucosa, and tongue normal; teeth and gums normal Neck: no adenopathy, no carotid bruit, supple, symmetrical, trachea midline and thyroid not enlarged, symmetric, no tenderness/mass/nodules Back: symmetric, no curvature. ROM normal. No CVA tenderness. Lungs: clear to auscultation bilaterally Heart: regular rate and rhythm,  S1, S2 normal, no murmur, click, rub or gallop Abdomen: soft, non-tender; bowel sounds normal; no masses,  no organomegaly Pulses: 2+ and symmetric Skin: Skin color, texture, turgor normal. No rashes or lesions Lymph nodes: Cervical, supraclavicular, and axillary nodes normal.  Assessment and Plan:  URI, acute Given his current symptoms and history of of advanced COPD I am compelled to start empiric antibiotic therapy to prevent this from becoming a full-blown COPD exacerbation which would likely require hospitalization given his prior intolerance to prednisone. I warned him of the risks of antibiotic use causing Clostridium difficile colitis and have advised him to take probiotic for least 2 weeks.   Updated Medication  List Outpatient Encounter Prescriptions as of 04/01/2013  Medication Sig  . aspirin 81 MG tablet Take 1 tablet (81 mg total) by mouth daily.  [name] atorvastatin (LIPITOR) 40 MG tablet Take 1 tablet (40 mg total) by mouth daily.  . clopidogrel (PLAVIX) 75 MG tablet TAKE ONE (1) TABLET EACH DAY  . methocarbamol (ROBAXIN) 500 MG tablet Take 1 tablet (500 mg total) by mouth 2 (two) times daily.  [name] omeprazole (PRILOSEC) 40 MG capsule TAKE ONE (1) CAPSULE EACH DAY  . oxyCODONE-acetaminophen (PERCOCET) 10-325 MG per tablet Take 1 tablet by mouth every 4 (four) hours as needed for pain.  . polyethylene glycol powder (GLYCOLAX/MIRALAX) powder Take 17 g by mouth as needed.   . solifenacin (VESICARE) 5 MG tablet Take 10 mg by mouth daily.  [name] tiotropium (SPIRIVA) 18 MCG inhalation capsule Place 18 mcg into inhaler and inhale daily.  [name] albuterol (PROVENTIL HFA;VENTOLIN HFA) 108 (90 BASE) MCG/ACT inhaler Inhale 2 puffs into the lungs every 6 (six) hours as needed for wheezing.  . benzonatate (TESSALON) 200 MG capsule Take 1 capsule (200 mg total) by mouth 3 (three) times daily as needed for cough.  . doxycycline (DORYX) 100 MG EC tablet Take 1 tablet (100 mg total) by mouth 2 (two) times daily.  . Lactobacillus-Inulin (CULTURELLE DIGESTIVE HEALTH) CAPS Take 1 capsule by mouth daily.  [name] levofloxacin (LEVAQUIN) 500 MG tablet Take 1 tablet (500 mg total) by mouth daily.  . traMADol (ULTRAM) 50 MG tablet Take 1 tablet (50 mg total) by mouth 4 (four) times daily as needed.     No orders of the defined types were placed in this encounter.    No Follow-up on file.

## 2013-04-02 ENCOUNTER — Ambulatory Visit: Payer: Medicare Other | Admitting: Internal Medicine

## 2013-04-03 DIAGNOSIS — M25519 Pain in unspecified shoulder: Secondary | ICD-10-CM | POA: Diagnosis not present

## 2013-04-03 NOTE — Assessment & Plan Note (Signed)
Given his current symptoms and history of of advanced COPD I am compelled to start empiric antibiotic therapy to prevent this from becoming a full-blown COPD exacerbation which would likely require hospitalization given his prior intolerance to prednisone. I warned him of the risks of antibiotic use causing Clostridium difficile colitis and have advised him to take probiotic for least 2 weeks.

## 2013-04-08 DIAGNOSIS — M25519 Pain in unspecified shoulder: Secondary | ICD-10-CM | POA: Diagnosis not present

## 2013-04-15 DIAGNOSIS — M25519 Pain in unspecified shoulder: Secondary | ICD-10-CM | POA: Diagnosis not present

## 2013-04-17 DIAGNOSIS — M25519 Pain in unspecified shoulder: Secondary | ICD-10-CM | POA: Diagnosis not present

## 2013-05-13 DIAGNOSIS — M4716 Other spondylosis with myelopathy, lumbar region: Secondary | ICD-10-CM | POA: Diagnosis not present

## 2013-05-18 DIAGNOSIS — M25519 Pain in unspecified shoulder: Secondary | ICD-10-CM | POA: Diagnosis not present

## 2013-05-20 DIAGNOSIS — Z8546 Personal history of malignant neoplasm of prostate: Secondary | ICD-10-CM | POA: Diagnosis not present

## 2013-05-20 LAB — HM COLONOSCOPY

## 2013-05-30 DIAGNOSIS — Z8546 Personal history of malignant neoplasm of prostate: Secondary | ICD-10-CM | POA: Diagnosis not present

## 2013-05-30 DIAGNOSIS — N318 Other neuromuscular dysfunction of bladder: Secondary | ICD-10-CM | POA: Diagnosis not present

## 2013-06-04 ENCOUNTER — Other Ambulatory Visit: Payer: Self-pay | Admitting: Internal Medicine

## 2013-06-15 DIAGNOSIS — M25519 Pain in unspecified shoulder: Secondary | ICD-10-CM | POA: Diagnosis not present

## 2013-06-16 DIAGNOSIS — M25519 Pain in unspecified shoulder: Secondary | ICD-10-CM | POA: Diagnosis not present

## 2013-06-19 DIAGNOSIS — M25519 Pain in unspecified shoulder: Secondary | ICD-10-CM | POA: Diagnosis not present

## 2013-06-24 DIAGNOSIS — M25519 Pain in unspecified shoulder: Secondary | ICD-10-CM | POA: Diagnosis not present

## 2013-06-27 DIAGNOSIS — M25519 Pain in unspecified shoulder: Secondary | ICD-10-CM | POA: Diagnosis not present

## 2013-07-01 DIAGNOSIS — M25519 Pain in unspecified shoulder: Secondary | ICD-10-CM | POA: Diagnosis not present

## 2013-07-04 DIAGNOSIS — M25519 Pain in unspecified shoulder: Secondary | ICD-10-CM | POA: Diagnosis not present

## 2013-07-07 DIAGNOSIS — M19019 Primary osteoarthritis, unspecified shoulder: Secondary | ICD-10-CM | POA: Diagnosis not present

## 2013-07-09 DIAGNOSIS — M25519 Pain in unspecified shoulder: Secondary | ICD-10-CM | POA: Diagnosis not present

## 2013-07-11 ENCOUNTER — Other Ambulatory Visit: Payer: Self-pay | Admitting: Cardiovascular Disease

## 2013-07-11 DIAGNOSIS — M25519 Pain in unspecified shoulder: Secondary | ICD-10-CM | POA: Diagnosis not present

## 2013-07-15 DIAGNOSIS — M25519 Pain in unspecified shoulder: Secondary | ICD-10-CM | POA: Diagnosis not present

## 2013-07-16 DIAGNOSIS — M25519 Pain in unspecified shoulder: Secondary | ICD-10-CM | POA: Diagnosis not present

## 2013-08-04 DIAGNOSIS — M4716 Other spondylosis with myelopathy, lumbar region: Secondary | ICD-10-CM | POA: Diagnosis not present

## 2013-08-11 DIAGNOSIS — M19019 Primary osteoarthritis, unspecified shoulder: Secondary | ICD-10-CM | POA: Diagnosis not present

## 2013-09-10 IMAGING — CR DG CHEST 1V PORT
1 series · 1 of 1 positions shown · non-contrast
Comparison: none

REASON FOR EXAM: hypoxia
COMMENTS:

[ap]
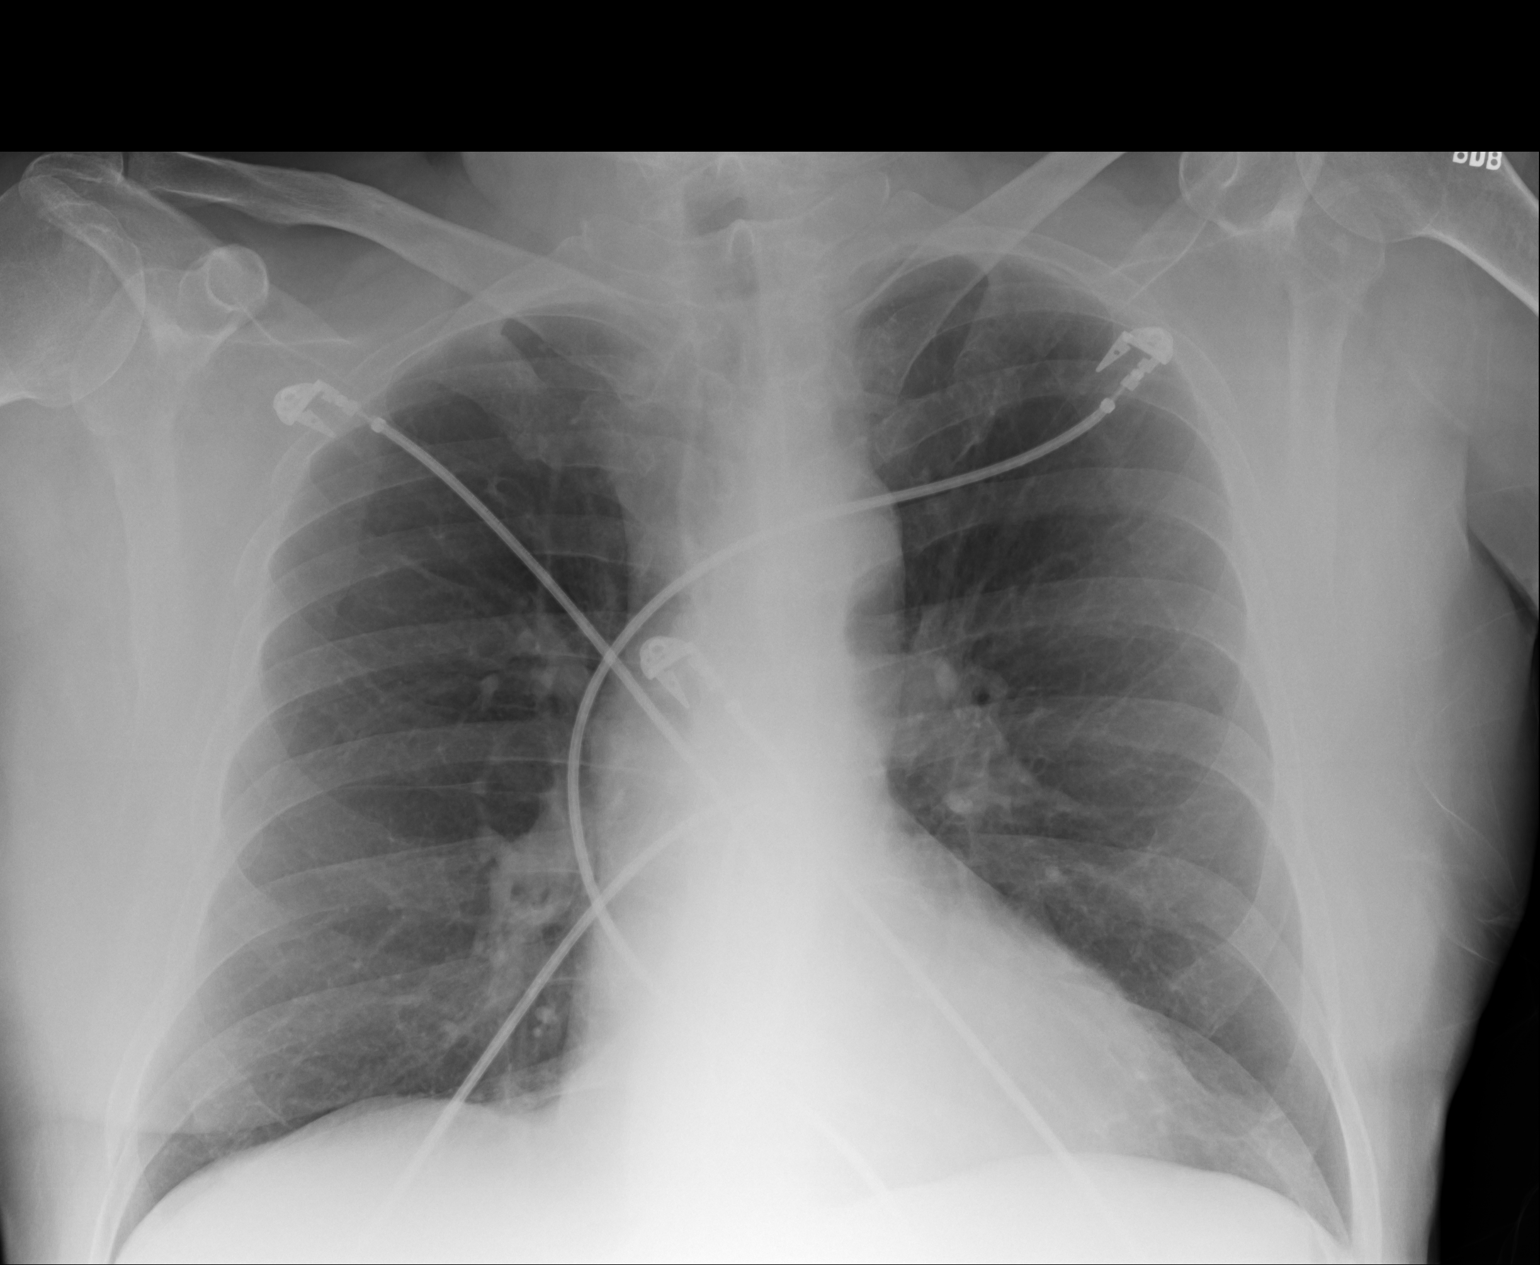

[1 of 1 positions shown; findings below may reference images not displayed]

PROCEDURE:     DXR - DXR PORTABLE CHEST SINGLE VIEW  - April 21, 2011 [DATE]

RESULT:

Comparison is made to the prior exam of 02/10/2011.

The lung fields are clear. No pneumonia, pneumothorax or pleural effusion is
seen. The heart size is normal. Monitoring electrodes are present. The chest
appears mildly hyperinflated bilaterally suspicious for a history of COPD.
IMPRESSION: 1.  Stable appearing chest as compared to the exam of 02/10/2011.
[DATE].  The lung fields are clear.
3.  The chest appears mildly hyperinflated bilaterally suspicious for a
history of COPD.

## 2013-10-13 ENCOUNTER — Encounter: Payer: Self-pay | Admitting: Adult Health

## 2013-10-13 ENCOUNTER — Ambulatory Visit (INDEPENDENT_AMBULATORY_CARE_PROVIDER_SITE_OTHER): Payer: Medicare Other | Admitting: Adult Health

## 2013-10-13 ENCOUNTER — Ambulatory Visit: Payer: No Typology Code available for payment source | Admitting: Internal Medicine

## 2013-10-13 VITALS — BP 132/74 | HR 84 | Temp 98.8°F | Resp 14 | Wt 198.2 lb

## 2013-10-13 DIAGNOSIS — M25569 Pain in unspecified knee: Secondary | ICD-10-CM

## 2013-10-13 DIAGNOSIS — M25561 Pain in right knee: Secondary | ICD-10-CM | POA: Insufficient documentation

## 2013-10-13 DIAGNOSIS — L259 Unspecified contact dermatitis, unspecified cause: Secondary | ICD-10-CM | POA: Diagnosis not present

## 2013-10-13 MED ORDER — HYDROCORTISONE 2.5 % EX CREA: TOPICAL_CREAM | Freq: Two times a day (BID) | CUTANEOUS | Status: DC

## 2013-10-13 NOTE — Patient Instructions (Signed)
  Hydrocortisone 2.5% cream to right knee twice a day.  Do not wear the knee brace.  Ice for 15 min at a time. Do this 3-4 times daily.  Continue Tramadol as you have been doing.  Please go to our [facility] in the morning for an xray of your knee. I will call you with the results and additional instructions.

## 2013-10-13 NOTE — Progress Notes (Signed)
Subjective:    Patient ID: [name], male    DOB: 01/19/1946, 68 y.o.   MRN: [id number]  Fall  68 yo male presents today for evaluation post-fall. His wife and granddaughter are present for the appointment. [place] about 2 weeks ago. Has had 3 previous back surgeries and has permanent nerve related damage. Was standing and his right leg leg gave way falling towards a wall and twisted his right knee. Has been taking his prescribed tramadol and attempted knee brace. Does not like to take any stronger medicine. Describes sharp pain that goes from mid-posterior thigh down towards his calf.   Per his wife he has appointment with Dr. Alvan Dame (orthpedics) on July 10. She reports she was not able to get an appointment for her husband any sooner.  Has notice a rash since starting to use neoprene sleeve. Used OTC hydrocortisone cream per the recommendation of pharmacist. Replaced brace and rash again broke out.   Past Medical History  Diagnosis Date  . Coronary artery disease   . COPD (chronic obstructive pulmonary disease)   . Hypertension   . Hyperlipidemia   . History of kidney stones   . History of prostate cancer   . MI (myocardial infarction) 03/2001  . Neuromuscular disorder     Current Outpatient Prescriptions on File Prior to Visit  Medication Sig Dispense Refill  . aspirin 81 MG tablet Take 1 tablet (81 mg total) by mouth daily.  30 tablet    . atorvastatin (LIPITOR) 40 MG tablet TAKE ONE (1) TABLET EACH DAY  90 tablet  3  . benzonatate (TESSALON) 200 MG capsule Take 1 capsule (200 mg total) by mouth 3 (three) times daily as needed for cough.  60 capsule  1  . clopidogrel (PLAVIX) 75 MG tablet TAKE ONE (1) TABLET EACH DAY  30 tablet  6  . doxycycline (DORYX) 100 MG EC tablet Take 1 tablet (100 mg total) by mouth 2 (two) times daily.  14 tablet  0  . Lactobacillus-Inulin ([place]) CAPS Take 1 capsule by mouth daily.  30 capsule  0  . levofloxacin (LEVAQUIN) 500 MG  tablet Take 1 tablet (500 mg total) by mouth daily.  7 tablet  0  . methocarbamol (ROBAXIN) 500 MG tablet Take 1 tablet (500 mg total) by mouth 2 (two) times daily.  180 tablet  3  . omeprazole (PRILOSEC) 40 MG capsule TAKE ONE (1) CAPSULE EACH DAY  90 capsule  1  . oxyCODONE-acetaminophen (PERCOCET) 10-325 MG per tablet Take 1 tablet by mouth every 4 (four) hours as needed for pain.      . polyethylene glycol powder (GLYCOLAX/MIRALAX) powder Take 17 g by mouth as needed.       . solifenacin (VESICARE) 5 MG tablet Take 10 mg by mouth daily.      [name] tiotropium (SPIRIVA) 18 MCG inhalation capsule Place 18 mcg into inhaler and inhale daily.      . traMADol (ULTRAM) 50 MG tablet Take 1 tablet (50 mg total) by mouth 4 (four) times daily as needed.  120 tablet  3  . albuterol (PROVENTIL HFA;VENTOLIN HFA) 108 (90 BASE) MCG/ACT inhaler Inhale 2 puffs into the lungs every 6 (six) hours as needed for wheezing.  1 Inhaler  0   No current facility-administered medications on file prior to visit.     Review of Systems  Constitutional: Negative.   HENT: Negative.   Eyes: Negative.   Respiratory: Negative.   Cardiovascular: Negative.  Gastrointestinal: Negative.   Endocrine: Negative.   Genitourinary: Negative.   Musculoskeletal: Positive for arthralgias (Right knee), back pain, gait problem and joint swelling (Right knee).  Skin: Positive for rash (Right leg).  Allergic/Immunologic: Negative.   Neurological: Negative.   Hematological: Negative.   Psychiatric/Behavioral: Negative.        Objective:  BP 132/74  Pulse 84  Temp(Src) 98.8 F (37.1 C) (Oral)  Resp 14  Wt 198 lb 4 oz (89.926 kg)  SpO2 97%   Physical Exam  Constitutional: He is oriented to person, place, and time. No distress.  Walks with cane  Musculoskeletal:       Right knee: He exhibits swelling and effusion.  Vesicular rash in shape of neoprene brace.  Neurological: He is alert and oriented to person, place, and time.    Skin: Skin is warm and dry.  Psychiatric: He has a normal mood and affect. His behavior is normal. Judgment and thought content normal.       Assessment & Plan:   1. Right knee pain Effusion, pain and swelling or right knee. Obtain x-ray to r/o fracture concern for meniscal injury may need MRI. Continue tramadol for pain. Advised to ice x 15 minutes 3-4 times per day. Do not wear the neoprene knee brace. Appointment with orthopedic on July 10th. Follow up prn with PCP.  - DG Knee Complete 4 Views Right; Future  2. Contact dermatitis Start hydrocortisone 2.5% cream to right knee twice daily. Do not use neoprene sleeve as this is a second outbreak.

## 2013-10-13 NOTE — Progress Notes (Signed)
Pre visit review using our clinic review tool, if applicable. No additional management support is needed unless otherwise documented below in the visit note. 

## 2013-10-14 ENCOUNTER — Other Ambulatory Visit: Payer: Self-pay | Admitting: Cardiovascular Disease

## 2013-10-14 ENCOUNTER — Encounter: Payer: Self-pay | Admitting: Adult Health

## 2013-10-14 ENCOUNTER — Ambulatory Visit (INDEPENDENT_AMBULATORY_CARE_PROVIDER_SITE_OTHER)
Admission: RE | Admit: 2013-10-14 | Discharge: 2013-10-14 | Disposition: A | Discharge status: Home / Self Care | Payer: Medicare Other | Source: Ambulatory Visit | Attending: Adult Health | Admitting: Adult Health

## 2013-10-14 DIAGNOSIS — S99919A Unspecified injury of unspecified ankle, initial encounter: Secondary | ICD-10-CM | POA: Diagnosis not present

## 2013-10-14 DIAGNOSIS — M25569 Pain in unspecified knee: Secondary | ICD-10-CM

## 2013-10-14 DIAGNOSIS — M25561 Pain in right knee: Secondary | ICD-10-CM

## 2013-10-14 DIAGNOSIS — S8990XA Unspecified injury of unspecified lower leg, initial encounter: Secondary | ICD-10-CM | POA: Diagnosis not present

## 2013-10-16 NOTE — Telephone Encounter (Signed)
Mailed unread message to pt  

## 2013-10-20 ENCOUNTER — Telehealth: Payer: Self-pay | Admitting: Adult Health

## 2013-10-20 NOTE — Telephone Encounter (Signed)
Spoke to patient and he stated that he has an office visit with orthopedic and needs a order for an MRI before he is seen on Friday. Patient states that you all discuss this at his last office visit. Please advise.

## 2013-10-20 NOTE — Telephone Encounter (Signed)
Needing results from xray in order to proceed to have his MRI done before Friday for his orthopedic appointment.

## 2013-10-21 ENCOUNTER — Other Ambulatory Visit: Payer: Self-pay | Admitting: Adult Health

## 2013-10-21 DIAGNOSIS — S838X1A Sprain of other specified parts of right knee, initial encounter: Secondary | ICD-10-CM

## 2013-10-21 NOTE — Telephone Encounter (Signed)
MRI of right knee has been ordered

## 2013-10-22 ENCOUNTER — Ambulatory Visit (HOSPITAL_COMMUNITY)
Admission: RE | Admit: 2013-10-22 | Discharge: 2013-10-22 | Disposition: A | Discharge status: Home / Self Care | Payer: Medicare Other | Source: Ambulatory Visit | Attending: Adult Health | Admitting: Adult Health

## 2013-10-22 DIAGNOSIS — X58XXXA Exposure to other specified factors, initial encounter: Secondary | ICD-10-CM | POA: Insufficient documentation

## 2013-10-22 DIAGNOSIS — M25569 Pain in unspecified knee: Secondary | ICD-10-CM | POA: Diagnosis not present

## 2013-10-22 DIAGNOSIS — M25469 Effusion, unspecified knee: Secondary | ICD-10-CM | POA: Insufficient documentation

## 2013-10-22 DIAGNOSIS — IMO0002 Reserved for concepts with insufficient information to code with codable children: Secondary | ICD-10-CM | POA: Insufficient documentation

## 2013-10-22 DIAGNOSIS — M7120 Synovial cyst of popliteal space [Baker], unspecified knee: Secondary | ICD-10-CM | POA: Diagnosis not present

## 2013-10-22 DIAGNOSIS — S838X1A Sprain of other specified parts of right knee, initial encounter: Secondary | ICD-10-CM

## 2013-10-23 ENCOUNTER — Encounter: Payer: Self-pay | Admitting: Adult Health

## 2013-10-24 DIAGNOSIS — Z5189 Encounter for other specified aftercare: Secondary | ICD-10-CM | POA: Diagnosis not present

## 2013-10-31 ENCOUNTER — Telehealth: Payer: Self-pay | Admitting: Cardiovascular Disease

## 2013-10-31 NOTE — Telephone Encounter (Signed)
Patient's wife would like to know if [facility] has cardiac clearance for surgery and if  He has to d/c any of his medications.

## 2013-10-31 NOTE — Telephone Encounter (Signed)
Spoke w/ pt's wife.  She states that cardiac clearance request was faxed to our office this am.  Advised her that I will have Christell Faith, PA take a look at it see if pt is cleared or if he needs further testing.

## 2013-11-06 ENCOUNTER — Telehealth: Payer: Self-pay

## 2013-11-06 NOTE — Telephone Encounter (Signed)
Cardiac clearance faxed to [place] to proceed w/ Rt knee scope & debridement. Christell Faith, PA advised pt to "stop Plavix if needed for procedure 5 days prior, then restart.  Continue aspirin 81 mg daily."

## 2013-11-20 DIAGNOSIS — M23329 Other meniscus derangements, posterior horn of medial meniscus, unspecified knee: Secondary | ICD-10-CM | POA: Diagnosis not present

## 2013-11-20 DIAGNOSIS — G8918 Other acute postprocedural pain: Secondary | ICD-10-CM | POA: Diagnosis not present

## 2013-11-20 DIAGNOSIS — M2240 Chondromalacia patellae, unspecified knee: Secondary | ICD-10-CM | POA: Diagnosis not present

## 2013-11-20 DIAGNOSIS — M9420 Chondromalacia, unspecified site: Secondary | ICD-10-CM | POA: Diagnosis not present

## 2013-11-20 DIAGNOSIS — M23305 Other meniscus derangements, unspecified medial meniscus, unspecified knee: Secondary | ICD-10-CM | POA: Diagnosis not present

## 2013-11-20 DIAGNOSIS — M23349 Other meniscus derangements, anterior horn of lateral meniscus, unspecified knee: Secondary | ICD-10-CM | POA: Diagnosis not present

## 2013-11-24 ENCOUNTER — Other Ambulatory Visit: Payer: Self-pay | Admitting: Internal Medicine

## 2013-11-27 ENCOUNTER — Ambulatory Visit (INDEPENDENT_AMBULATORY_CARE_PROVIDER_SITE_OTHER): Payer: Medicare Other | Admitting: Adult Health

## 2013-11-27 ENCOUNTER — Encounter: Payer: Self-pay | Admitting: Adult Health

## 2013-11-27 VITALS — BP 120/78 | HR 80 | Temp 98.4°F | Resp 16 | Wt 194.8 lb

## 2013-11-27 DIAGNOSIS — J069 Acute upper respiratory infection, unspecified: Secondary | ICD-10-CM

## 2013-11-27 MED ORDER — AZITHROMYCIN 250 MG PO TABS: ORAL_TABLET | ORAL | Status: DC

## 2013-11-27 NOTE — Progress Notes (Signed)
Pre visit review using our clinic review tool, if applicable. No additional management support is needed unless otherwise documented below in the visit note. 

## 2013-11-27 NOTE — Progress Notes (Signed)
Patient ID: [name], male   DOB: January 08, 1946, 68 y.o.   MRN: [id number]   Subjective:    Patient ID: [name], male    DOB: 1946/02/16, 68 y.o.   MRN: [id number]  HPI patient is a pleasant 68 year old male who presents with coughing, rhinorrhea, postnasal drip. Reports that he is coughing up clear to yellow sputum. No fever. Feels in general discomfort. Reports that his granddaughter has been sick. He has not taken any over-the-counter medication but reports he is not able to sleep secondary to cough.  Past Medical History  Diagnosis Date  . Coronary artery disease   . COPD (chronic obstructive pulmonary disease)   . Hypertension   . Hyperlipidemia   . History of kidney stones   . History of prostate cancer   . MI (myocardial infarction) 03/2001  . Neuromuscular disorder     Current Outpatient Prescriptions on File Prior to Visit  Medication Sig Dispense Refill  . atorvastatin (LIPITOR) 40 MG tablet TAKE ONE (1) TABLET EACH DAY  90 tablet  3  . clopidogrel (PLAVIX) 75 MG tablet TAKE ONE (1) TABLET EACH DAY  30 tablet  3  . hydrocortisone 2.5 % cream Apply topically 2 (two) times daily. Apply to right knee  30 g  0  . Lactobacillus-Inulin ([place]) CAPS Take 1 capsule by mouth daily.  30 capsule  0  . methocarbamol (ROBAXIN) 500 MG tablet Take 1 tablet (500 mg total) by mouth 2 (two) times daily.  180 tablet  3  . omeprazole (PRILOSEC) 40 MG capsule TAKE ONE (1) CAPSULE EACH DAY  90 capsule  1  . polyethylene glycol powder (GLYCOLAX/MIRALAX) powder Take 17 g by mouth as needed.       . solifenacin (VESICARE) 5 MG tablet Take 10 mg by mouth daily.      [name] tiotropium (SPIRIVA) 18 MCG inhalation capsule Place 18 mcg into inhaler and inhale daily.      . traMADol (ULTRAM) 50 MG tablet Take 1 tablet (50 mg total) by mouth 4 (four) times daily as needed.  120 tablet  3  . albuterol (PROVENTIL HFA;VENTOLIN HFA) 108 (90 BASE) MCG/ACT inhaler Inhale 2 puffs into the  lungs every 6 (six) hours as needed for wheezing.  1 Inhaler  0   No current facility-administered medications on file prior to visit.     Review of Systems  Constitutional: Negative for fever and chills.  HENT: Positive for congestion, postnasal drip, rhinorrhea and sinus pressure. Negative for sore throat.   Respiratory: Positive for cough. Negative for shortness of breath and wheezing.   Gastrointestinal: Negative.   All other systems reviewed and are negative.      Objective:  BP 120/78  Pulse 80  Temp(Src) 98.4 F (36.9 C) (Oral)  Resp 16  Wt 194 lb 12 oz (88.338 kg)  SpO2 95%   Physical Exam  Constitutional: He is oriented to person, place, and time. He appears well-developed and well-nourished. No distress.  HENT:  Head: Normocephalic and atraumatic.  Mouth/Throat: No oropharyngeal exudate.  Oropharynx is erythematous. No exudate.  Eyes: Conjunctivae and EOM are normal.  Neck: Normal range of motion. Neck supple.  Cardiovascular: Normal rate, regular rhythm and normal heart sounds.  Exam reveals no gallop and no friction rub.   No murmur heard. Pulmonary/Chest: Effort normal and breath sounds normal. No respiratory distress. He has no wheezes. He has no rales.  Musculoskeletal: Normal range of motion.  Lymphadenopathy:  He has cervical adenopathy.  Neurological: He is alert and oriented to person, place, and time.  Skin: Skin is warm and dry.  Psychiatric: He has a normal mood and affect. His behavior is normal. Judgment and thought content normal.       Assessment & Plan:   1. URI, acute Start Azithromycin Robitussin or Delsym for cough RTC if no improvement within 3-4 days

## 2013-11-27 NOTE — Patient Instructions (Signed)
  Start azithromycin 2 tablets today then one tablet daily for the next 4 days.  Try Robitussin or Delsym. Make sure it contains guaifenesin and dextromethorphan for cough.  Drink plenty of water to maintain hydration.  Call if no improvement within 3-4 days

## 2013-12-04 DIAGNOSIS — M4716 Other spondylosis with myelopathy, lumbar region: Secondary | ICD-10-CM | POA: Diagnosis not present

## 2013-12-16 DIAGNOSIS — M25569 Pain in unspecified knee: Secondary | ICD-10-CM | POA: Diagnosis not present

## 2014-01-15 DIAGNOSIS — M25561 Pain in right knee: Secondary | ICD-10-CM | POA: Diagnosis not present

## 2014-01-20 DIAGNOSIS — M25561 Pain in right knee: Secondary | ICD-10-CM | POA: Diagnosis not present

## 2014-01-27 DIAGNOSIS — M25561 Pain in right knee: Secondary | ICD-10-CM | POA: Diagnosis not present

## 2014-01-28 DIAGNOSIS — M65841 Other synovitis and tenosynovitis, right hand: Secondary | ICD-10-CM | POA: Diagnosis not present

## 2014-01-29 DIAGNOSIS — M25561 Pain in right knee: Secondary | ICD-10-CM | POA: Diagnosis not present

## 2014-02-17 ENCOUNTER — Ambulatory Visit: Payer: Medicare Other

## 2014-02-19 DIAGNOSIS — H5213 Myopia, bilateral: Secondary | ICD-10-CM | POA: Diagnosis not present

## 2014-02-19 DIAGNOSIS — H2513 Age-related nuclear cataract, bilateral: Secondary | ICD-10-CM | POA: Diagnosis not present

## 2014-02-23 DIAGNOSIS — M180 Bilateral primary osteoarthritis of first carpometacarpal joints: Secondary | ICD-10-CM | POA: Diagnosis not present

## 2014-02-23 DIAGNOSIS — M65841 Other synovitis and tenosynovitis, right hand: Secondary | ICD-10-CM | POA: Diagnosis not present

## 2014-02-24 ENCOUNTER — Ambulatory Visit (INDEPENDENT_AMBULATORY_CARE_PROVIDER_SITE_OTHER): Payer: Medicare Other

## 2014-02-24 ENCOUNTER — Other Ambulatory Visit: Payer: Self-pay | Admitting: Cardiovascular Disease

## 2014-02-24 DIAGNOSIS — Z23 Encounter for immunization: Secondary | ICD-10-CM

## 2014-03-11 DIAGNOSIS — Z4789 Encounter for other orthopedic aftercare: Secondary | ICD-10-CM | POA: Diagnosis not present

## 2014-03-11 DIAGNOSIS — M25561 Pain in right knee: Secondary | ICD-10-CM | POA: Diagnosis not present

## 2014-05-04 DIAGNOSIS — M65841 Other synovitis and tenosynovitis, right hand: Secondary | ICD-10-CM | POA: Diagnosis not present

## 2014-05-04 DIAGNOSIS — M25532 Pain in left wrist: Secondary | ICD-10-CM | POA: Diagnosis not present

## 2014-05-04 DIAGNOSIS — M79645 Pain in left finger(s): Secondary | ICD-10-CM | POA: Diagnosis not present

## 2014-05-04 DIAGNOSIS — M25332 Other instability, left wrist: Secondary | ICD-10-CM | POA: Diagnosis not present

## 2014-05-07 ENCOUNTER — Ambulatory Visit (INDEPENDENT_AMBULATORY_CARE_PROVIDER_SITE_OTHER): Payer: Medicare Other | Admitting: Cardiovascular Disease

## 2014-05-07 ENCOUNTER — Encounter: Payer: Self-pay | Admitting: Cardiovascular Disease

## 2014-05-07 VITALS — BP 128/68 | HR 67 | Ht 72.0 in | Wt 187.5 lb

## 2014-05-07 DIAGNOSIS — I10 Essential (primary) hypertension: Secondary | ICD-10-CM | POA: Diagnosis not present

## 2014-05-07 DIAGNOSIS — Z716 Tobacco abuse counseling: Secondary | ICD-10-CM

## 2014-05-07 DIAGNOSIS — I25110 Atherosclerotic heart disease of native coronary artery with unstable angina pectoris: Secondary | ICD-10-CM

## 2014-05-07 DIAGNOSIS — M19049 Primary osteoarthritis, unspecified hand: Secondary | ICD-10-CM | POA: Insufficient documentation

## 2014-05-07 DIAGNOSIS — J449 Chronic obstructive pulmonary disease, unspecified: Secondary | ICD-10-CM | POA: Diagnosis not present

## 2014-05-07 DIAGNOSIS — M1990 Unspecified osteoarthritis, unspecified site: Secondary | ICD-10-CM

## 2014-05-07 DIAGNOSIS — E785 Hyperlipidemia, unspecified: Secondary | ICD-10-CM

## 2014-05-07 MED ORDER — ATORVASTATIN CALCIUM 40 MG PO TABS: 40.0000 mg | ORAL_TABLET | Freq: Every day | ORAL | Status: DC

## 2014-05-07 NOTE — Assessment & Plan Note (Addendum)
He reports having severe arthritis in his hands. Has a brace on bilaterally

## 2014-05-07 NOTE — Assessment & Plan Note (Signed)
Currently with no symptoms of angina. No further workup at this time. Continue current medication regimen. 

## 2014-05-07 NOTE — Assessment & Plan Note (Signed)
Cholesterol is at goal on the current lipid regimen. No changes to the medications were made.  

## 2014-05-07 NOTE — Assessment & Plan Note (Signed)
Stable shortness of breath.

## 2014-05-07 NOTE — Progress Notes (Signed)
Patient ID: [name], male    DOB: [birth year], 69 y.o.   MRN: [id number]  HPI Comments: 69 years old with CAD,  posterior MI in 2002 treated with a bare-metal stent to the circumflex artery,  back surgery by Dr. Patrice Paradise x2 In 2009 and 2010, and he had a negative Myoview scan at that time with an ejection fraction of 60% (2010).He had another preop evaluation in February 2011 prior to prostate biopsy and he ended up having a seed implantation. abnormal EKG noted on office visit in 2012,  He had a stress test that showed anterior wall ischemia. Cardiac catheterization was performed on January 05 2011 that showed 95% proximal LAD disease. A bare metal stent was placed ( 3.5 x 18 mm vision bare metal stent). He presents for follow up of his CAD.   In general he reports that he is doing well. Denies any shortness of breath or chest pain symptoms. Notes indicate he has lost 20 pounds by watching his diet. Still on Lipitor. He has severe arthritis in his hands, wrist Still with chronic back pain  EKG today shows sinus rhythm with rate 67 beats per minute with no significant ST or T-wave abnormality, consider old inferior MI  Prior medical hx he is s/p back surgery with profound anemia noted after the surgery. He was admitted to the hospital after routine blood work showed a HCT of 8. In the ER was noted to have hypotension. He had a transfusion, EGD, colonoscopy.   CT of the ABD did not show a bleed. He has received an iron infusion.   Previous CT scan showed abdominal aortic aneurysm 3.7 cm x 3.2 cm         Allergies  Allergen Reactions  . Lyrica [Pregabalin]     Swelling   . Prednisone     Feels like having a heart attack    Outpatient Encounter Prescriptions as of 05/07/2014  Medication Sig  . albuterol (PROVENTIL HFA;VENTOLIN HFA) 108 (90 BASE) MCG/ACT inhaler Inhale 2 puffs into the lungs every 6 (six) hours as needed for wheezing.  [name] aspirin 81 MG tablet Take 81 mg by mouth  daily.  [name] atorvastatin (LIPITOR) 40 MG tablet Take 1 tablet (40 mg total) by mouth daily at 6 PM.  . clopidogrel (PLAVIX) 75 MG tablet TAKE ONE (1) TABLET EACH DAY  . methocarbamol (ROBAXIN) 500 MG tablet Take 1 tablet (500 mg total) by mouth 2 (two) times daily.  [name] omeprazole (PRILOSEC) 40 MG capsule TAKE ONE (1) CAPSULE EACH DAY  . polyethylene glycol powder (GLYCOLAX/MIRALAX) powder Take 17 g by mouth as needed.   . tiotropium (SPIRIVA) 18 MCG inhalation capsule Place 18 mcg into inhaler and inhale daily.  . traMADol (ULTRAM) 50 MG tablet Take 1 tablet (50 mg total) by mouth 4 (four) times daily as needed.    Past Medical History  Diagnosis Date  . Coronary artery disease   . COPD (chronic obstructive pulmonary disease)   . Hypertension   . Hyperlipidemia   . History of kidney stones   . History of prostate cancer   . MI (myocardial infarction) 03/2001  . Neuromuscular disorder     Past Surgical History  Procedure Laterality Date  . Prostate surgery  09/2009    prostate implant  . Back surgery  2009/2010    x 2  . Hernia repair      bilateral   . Foot surgery      bilateral   .  Knee surgery      right knee   . Cardiac catheterization  2002    stent placement [place]  . Spine surgery      Social History  reports that he quit smoking about 3 years ago. His smoking use included Cigarettes. He smoked 1.00 pack per day. He has never used smokeless tobacco. He reports that he does not drink alcohol or use illicit drugs.  Family History family history includes Diabetes in his mother; Heart attack in his mother; Heart disease in his father.  Review of Systems  Constitutional: Negative.   Respiratory: Negative.   Cardiovascular: Negative.   Gastrointestinal: Negative.   Musculoskeletal: Positive for arthralgias.       Left shoulder pain  Skin: Negative.   Neurological: Negative.   Hematological: Negative.   Psychiatric/Behavioral: Negative.   All other systems  reviewed and are negative.   BP 128/68 mmHg  Pulse 67  Ht 6' (1.829 m)  Wt 187 lb 8 oz (85.049 kg)  BMI 25.42 kg/m2  Physical Exam  Constitutional: He is oriented to person, place, and time. He appears well-developed and well-nourished.  HENT:  Head: Normocephalic.  Nose: Nose normal.  Mouth/Throat: Oropharynx is clear and moist.  Eyes: Conjunctivae are normal. Pupils are equal, round, and reactive to light.  Neck: Normal range of motion. Neck supple. No JVD present.  Cardiovascular: Normal rate, regular rhythm, S1 normal, S2 normal, normal heart sounds and intact distal pulses.  Exam reveals no gallop and no friction rub.   No murmur heard. Pulmonary/Chest: Effort normal. No respiratory distress. He has decreased breath sounds. He has no wheezes. He has no rales. He exhibits no tenderness.  Abdominal: Soft. Bowel sounds are normal. He exhibits no distension. There is no tenderness.  Musculoskeletal: Normal range of motion. He exhibits no edema or tenderness.  Lymphadenopathy:    He has no cervical adenopathy.  Neurological: He is alert and oriented to person, place, and time. Coordination normal.  Skin: Skin is warm and dry. No rash noted. No erythema.  Psychiatric: He has a normal mood and affect. His behavior is normal. Judgment and thought content normal.      Assessment and Plan   Nursing note and vitals reviewed.

## 2014-05-07 NOTE — Assessment & Plan Note (Signed)
Blood pressure is well controlled on today's visit. No changes made to the medications. 

## 2014-05-07 NOTE — Assessment & Plan Note (Signed)
We have encouraged him to continue to work on weaning his cigarettes and smoking cessation. He will continue to work on this and does not want any assistance with chantix.  

## 2014-05-07 NOTE — Patient Instructions (Addendum)
You are doing well.  Please hold the plavix (or every other day) Stay on aspirin one a day  Please call us if you have new issues that need to be addressed before your next appt.  Your physician wants you to follow-up in: 12 months.  You will receive a reminder letter in the mail two months in advance. If you don't receive a letter, please call our office to schedule the follow-up appointment.

## 2014-05-13 ENCOUNTER — Ambulatory Visit: Payer: Self-pay | Admitting: Unknown Physician Specialty

## 2014-05-13 DIAGNOSIS — Z8673 Personal history of transient ischemic attack (TIA), and cerebral infarction without residual deficits: Secondary | ICD-10-CM | POA: Diagnosis not present

## 2014-05-13 DIAGNOSIS — I10 Essential (primary) hypertension: Secondary | ICD-10-CM | POA: Diagnosis not present

## 2014-05-13 DIAGNOSIS — K2980 Duodenitis without bleeding: Secondary | ICD-10-CM | POA: Diagnosis not present

## 2014-05-13 DIAGNOSIS — K2270 Barrett's esophagus without dysplasia: Secondary | ICD-10-CM | POA: Diagnosis not present

## 2014-05-13 DIAGNOSIS — F17200 Nicotine dependence, unspecified, uncomplicated: Secondary | ICD-10-CM | POA: Diagnosis not present

## 2014-05-13 DIAGNOSIS — K210 Gastro-esophageal reflux disease with esophagitis: Secondary | ICD-10-CM | POA: Diagnosis not present

## 2014-05-13 DIAGNOSIS — Z7982 Long term (current) use of aspirin: Secondary | ICD-10-CM | POA: Diagnosis not present

## 2014-05-13 DIAGNOSIS — Z8546 Personal history of malignant neoplasm of prostate: Secondary | ICD-10-CM | POA: Diagnosis not present

## 2014-05-13 DIAGNOSIS — K317 Polyp of stomach and duodenum: Secondary | ICD-10-CM | POA: Diagnosis not present

## 2014-05-13 DIAGNOSIS — J449 Chronic obstructive pulmonary disease, unspecified: Secondary | ICD-10-CM | POA: Diagnosis not present

## 2014-05-13 DIAGNOSIS — B9681 Helicobacter pylori [H. pylori] as the cause of diseases classified elsewhere: Secondary | ICD-10-CM | POA: Diagnosis not present

## 2014-05-13 DIAGNOSIS — Z955 Presence of coronary angioplasty implant and graft: Secondary | ICD-10-CM | POA: Diagnosis not present

## 2014-05-13 DIAGNOSIS — I252 Old myocardial infarction: Secondary | ICD-10-CM | POA: Diagnosis not present

## 2014-05-20 ENCOUNTER — Encounter: Payer: Self-pay | Admitting: Internal Medicine

## 2014-05-20 ENCOUNTER — Ambulatory Visit (INDEPENDENT_AMBULATORY_CARE_PROVIDER_SITE_OTHER): Payer: Medicare Other | Admitting: Internal Medicine

## 2014-05-20 VITALS — BP 120/64 | HR 76 | Temp 98.5°F | Resp 14 | Ht 70.25 in | Wt 188.5 lb

## 2014-05-20 DIAGNOSIS — Z0000 Encounter for general adult medical examination without abnormal findings: Secondary | ICD-10-CM

## 2014-05-20 DIAGNOSIS — K2270 Barrett's esophagus without dysplasia: Secondary | ICD-10-CM

## 2014-05-20 DIAGNOSIS — R5383 Other fatigue: Secondary | ICD-10-CM

## 2014-05-20 DIAGNOSIS — D126 Benign neoplasm of colon, unspecified: Secondary | ICD-10-CM | POA: Diagnosis not present

## 2014-05-20 DIAGNOSIS — Z1159 Encounter for screening for other viral diseases: Secondary | ICD-10-CM

## 2014-05-20 DIAGNOSIS — K5900 Constipation, unspecified: Secondary | ICD-10-CM | POA: Insufficient documentation

## 2014-05-20 DIAGNOSIS — M19049 Primary osteoarthritis, unspecified hand: Secondary | ICD-10-CM

## 2014-05-20 DIAGNOSIS — K5909 Other constipation: Secondary | ICD-10-CM

## 2014-05-20 DIAGNOSIS — E559 Vitamin D deficiency, unspecified: Secondary | ICD-10-CM | POA: Diagnosis not present

## 2014-05-20 DIAGNOSIS — Z716 Tobacco abuse counseling: Secondary | ICD-10-CM

## 2014-05-20 DIAGNOSIS — Z23 Encounter for immunization: Secondary | ICD-10-CM | POA: Diagnosis not present

## 2014-05-20 DIAGNOSIS — E785 Hyperlipidemia, unspecified: Secondary | ICD-10-CM

## 2014-05-20 DIAGNOSIS — M1990 Unspecified osteoarthritis, unspecified site: Secondary | ICD-10-CM

## 2014-05-20 LAB — COMPREHENSIVE METABOLIC PANEL
ALBUMIN: 3.9 g/dL (ref 3.5–5.2)
ALT: 10 U/L (ref 0–53)
AST: 13 U/L (ref 0–37)
Alkaline Phosphatase: 93 U/L (ref 39–117)
BUN: 14 mg/dL (ref 6–23)
CALCIUM: 9.1 mg/dL (ref 8.4–10.5)
CO2: 30 meq/L (ref 19–32)
Chloride: 106 mEq/L (ref 96–112)
Creatinine, Ser: 0.88 mg/dL (ref 0.40–1.50)
GFR: 90.86 mL/min (ref >60.00)
GLUCOSE: 88 mg/dL (ref 70–99)
Potassium: 4 mEq/L (ref 3.5–5.1)
SODIUM: 139 meq/L (ref 135–145)
Total Bilirubin: 0.8 mg/dL (ref 0.2–1.2)
Total Protein: 6.5 g/dL (ref 6.0–8.3)

## 2014-05-20 LAB — LIPID PANEL
CHOLESTEROL: 147 mg/dL (ref 0–200)
HDL: 44.1 mg/dL (ref >39.00)
LDL Cholesterol: 84 mg/dL (ref 0–99)
NONHDL: 102.9
Total CHOL/HDL Ratio: 3
Triglycerides: 95 mg/dL (ref 0.0–149.0)
VLDL: 19 mg/dL (ref 0.0–40.0)

## 2014-05-20 LAB — CBC WITH DIFFERENTIAL/PLATELET
BASOS ABS: 0 10*3/uL (ref 0.0–0.1)
BASOS PCT: 0.4 % (ref 0.0–3.0)
EOS PCT: 4 % (ref 0.0–5.0)
Eosinophils Absolute: 0.4 10*3/uL (ref 0.0–0.7)
HCT: 42.9 % (ref 39.0–52.0)
HEMOGLOBIN: 14.5 g/dL (ref 13.0–17.0)
LYMPHS ABS: 2.1 10*3/uL (ref 0.7–4.0)
Lymphocytes Relative: 22.4 % (ref 12.0–46.0)
MCHC: 33.8 g/dL (ref 30.0–36.0)
MCV: 89.9 fl (ref 78.0–100.0)
Monocytes Absolute: 0.6 10*3/uL (ref 0.1–1.0)
Monocytes Relative: 6.4 % (ref 3.0–12.0)
Neutro Abs: 6.2 10*3/uL (ref 1.4–7.7)
Neutrophils Relative %: 66.8 % (ref 43.0–77.0)
PLATELETS: 191 10*3/uL (ref 150.0–400.0)
RBC: 4.77 Mil/uL (ref 4.22–5.81)
RDW: 13.7 % (ref 11.5–15.5)
WBC: 9.3 10*3/uL (ref 4.0–10.5)

## 2014-05-20 LAB — TSH: TSH: 2.89 u[IU]/mL (ref 0.35–4.50)

## 2014-05-20 LAB — VITAMIN D 25 HYDROXY (VIT D DEFICIENCY, FRACTURES): VITD: 35.58 ng/mL (ref 30.00–100.00)

## 2014-05-20 NOTE — Assessment & Plan Note (Signed)
  Next scope due 2018

## 2014-05-20 NOTE — Progress Notes (Signed)
Patient ID: [name], male   DOB: May 18, 1945, 69 y.o.   MRN: [id number]  The patient is here for annual Medicare wellness examination and management of other chronic and acute problems. In the interim since his last visit he has been diagnosed with prostate CA and underwent  seed implantation  By [facility]  Dr Jeffie Pollock His serial PSAs have been noted to decline and he has no sdverse symptoms including bladder dysfunction.   He underwent and EGD last week by Vira Agar for Barretts Esophagus surveillance  He is currently suffering from tendonitis in both wrists andhis left thumb recurrently dislocates due to severe DJD .  No surgery is planned per recommendation  from [place] [facility]ist Dr. Roxy Manns.  He has had prior left shoulder surgery by Dr. Theda Sers and his pain has resolved (2014)   He sleeps in a recliner due to back pain, recurrent leg spasms.    The risk factors are reflected in the social history.  The roster of all physicians providing medical care to patient - is listed in the Snapshot section of the chart.  Activities of daily living:  The patient is 100% independent in all ADLs: dressing, toileting, feeding as well as independent mobility  Home safety : The patient has smoke detectors in the home. They wear seatbelts.  There are no firearms at home. There is no violence in the home.   There is no risks for hepatitis, STDs or HIV. There is no   history of blood transfusion. They have no travel history to infectious disease endemic areas of the world.  The patient has seen their dentist in the last six month. They have seen their eye doctor in the last year. They admit to slight hearing difficulty with regard to whispered voices and some television programs.  They have deferred audiologic testing in the last year.  They do not  have excessive sun exposure. Discussed the need for sun protection: hats, long sleeves and use of sunscreen if there is significant sun exposure.   Diet: the  importance of a healthy diet is discussed. They do have a healthy diet.  The benefits of regular aerobic exercise were discussed. He does not exercise due to chronic pain  and COPD and is tired from providing daily daycare of his young toddler granddaughter.   Depression screen: there are no signs or vegative symptoms of depression- irritability, change in appetite, anhedonia, sadness/tearfullness.  Cognitive assessment: the patient manages all their financial and personal affairs and is actively engaged. They could relate day,date,year and events; recalled 2/3 objects at 3 minutes; performed clock-face test normally.  The following portions of the patient's history were reviewed and updated as appropriate: allergies, current medications, past family history, past medical history,  past surgical history, past social history  and problem list.  Visual acuity was not assessed per patient preference since she has regular follow up with her ophthalmologist. Hearing and body mass index were assessed and reviewed.   During the course of the visit the patient was educated and counseled about appropriate screening and preventive services including : fall prevention , diabetes screening, nutrition counseling, colorectal cancer screening, and recommended immunizations.     Review of Systems:  Patient denies headache, fevers, malaise, unintentional weight loss, skin rash, eye pain, sinus congestion and sinus pain, sore throat, dysphagia,  hemoptysis , cough,wheezing, chest pain, palpitations, orthopnea, edema, abdominal pain, nausea, melena, diarrhea, constipation, flank pain, dysuria, hematuria, urinary  Frequency, nocturia, numbness, tingling, seizures,  Focal  weakness, Loss of consciousness,  Tremor, insomnia, depression, anxiety, and suicidal ideation.     Objective:  BP 120/64 mmHg  Pulse 76  Temp(Src) 98.5 F (36.9 C) (Oral)  Resp 14  Ht 5' 10.25" (1.784 m)  Wt 188 lb 8 oz (85.503 kg)  BMI  26.87 kg/m2  SpO2 95%  General appearance: alert, cooperative and appears stated age Ears: normal TM's and external ear canals both ears Throat: lips, mucosa, and tongue normal; teeth and gums normal Neck: no adenopathy, no carotid bruit, supple, symmetrical, trachea midline and thyroid not enlarged, symmetric, no tenderness/mass/nodules Back: symmetric, no curvature. ROM normal. No CVA tenderness. Lungs: clear to auscultation bilaterally Heart: regular rate and rhythm, S1, S2 normal, no murmur, click, rub or gallop Abdomen: soft, non-tender; bowel sounds normal; no masses,  no organomegaly Pulses: 2+ and symmetric Skin: Skin color, texture, turgor normal. No rashes or lesions Lymph nodes: Cervical, supraclavicular, and axillary nodes normal.   Assessment and Plan:  Problem List Items Addressed This Visit    Arthritis of hand    He has severe DJD of left thumb and is wearing a brace to keep the the 1st MC in place. He has been advised by orthopedics to avoid surgery       Barrett's esophagus    He has periodic surveillance with EGD by Dr Vira Agar,  Last EGD Jan 2016. Advised to continue daily PPI.      COLONIC POLYPS     Next scope due 2018      Constipation   Hyperlipidemia    He is on a high potency statin  And tolerating it without side effects. No changes today   Lab Results  Component Value Date   CHOL 147 05/20/2014   HDL 44.10 05/20/2014   LDLCALC 84 05/20/2014   TRIG 95.0 05/20/2014   CHOLHDL 3 05/20/2014   Lab Results  Component Value Date   ALT 10 05/20/2014   AST 13 05/20/2014   ALKPHOS 93 05/20/2014   BILITOT 0.8 05/20/2014         Relevant Orders   Lipid panel (Completed)   Medicare annual wellness visit, subsequent    Annual Medicare wellness  exam was done as well as a comprehensive physical exam and management of acute and chronic conditions .  During the course of the visit the patient was educated and counseled about appropriate screening and  preventive services including : fall prevention , diabetes screening, nutrition counseling, colorectal cancer screening, and recommended immunizations.  Printed recommendations for health maintenance screenings was given.       Tobacco abuse counseling    He quit smoking in 2012, but has occasional slips.  Encouragement given.        Other Visit Diagnoses    Other fatigue    -  Primary    Relevant Orders    CBC with Differential/Platelet (Completed)    Comprehensive metabolic panel (Completed)    TSH (Completed)    Need for hepatitis C screening test        Relevant Orders    Hepatitis C antibody (Completed)    Vitamin D deficiency        Relevant Orders    Vit D  25 hydroxy (rtn osteoporosis monitoring) (Completed)    Need for prophylactic vaccination against Streptococcus pneumoniae (pneumococcus)        Relevant Orders    Pneumococcal conjugate vaccine 13-valent (Completed)

## 2014-05-20 NOTE — Progress Notes (Signed)
Pre-visit discussion using our clinic review tool. No additional management support is needed unless otherwise documented below in the visit note.  

## 2014-05-20 NOTE — Patient Instructions (Signed)

## 2014-05-21 LAB — HEPATITIS C ANTIBODY: HCV AB: NEGATIVE

## 2014-05-22 ENCOUNTER — Encounter: Payer: Self-pay | Admitting: Internal Medicine

## 2014-05-23 ENCOUNTER — Encounter: Payer: Self-pay | Admitting: Internal Medicine

## 2014-05-23 DIAGNOSIS — Z0000 Encounter for general adult medical examination without abnormal findings: Secondary | ICD-10-CM | POA: Insufficient documentation

## 2014-05-23 NOTE — Assessment & Plan Note (Signed)
He quit smoking in 2012, but has occasional slips.  Encouragement given.

## 2014-05-23 NOTE — Assessment & Plan Note (Signed)
He is on a high potency statin  And tolerating it without side effects. No changes today   Lab Results  Component Value Date   CHOL 147 05/20/2014   HDL 44.10 05/20/2014   LDLCALC 84 05/20/2014   TRIG 95.0 05/20/2014   CHOLHDL 3 05/20/2014   Lab Results  Component Value Date   ALT 10 05/20/2014   AST 13 05/20/2014   ALKPHOS 93 05/20/2014   BILITOT 0.8 05/20/2014

## 2014-05-23 NOTE — Assessment & Plan Note (Signed)
He has severe DJD of left thumb and is wearing a brace to keep the the 1st MC in place. He has been advised by orthopedics to avoid surgery

## 2014-05-23 NOTE — Assessment & Plan Note (Signed)

## 2014-05-23 NOTE — Assessment & Plan Note (Addendum)
He has periodic surveillance with EGD by Dr Vira Agar,  Last EGD Jan 2016. Advised to continue daily PPI.

## 2014-05-28 DIAGNOSIS — Z8546 Personal history of malignant neoplasm of prostate: Secondary | ICD-10-CM | POA: Diagnosis not present

## 2014-06-01 IMAGING — CT CT CHEST W/O CM
1 series · 15 of 32 positions shown, 19 images · non-contrast
Comparison: none

REASON FOR EXAM: Pulmonary Nodule
COMMENTS:

[Series 2: soft tissue · axial · 0.73mm/px · z∈[-256,+50]mm · 15 of 116 slices shown, 19 images]
[im 9/116  mediastinal]
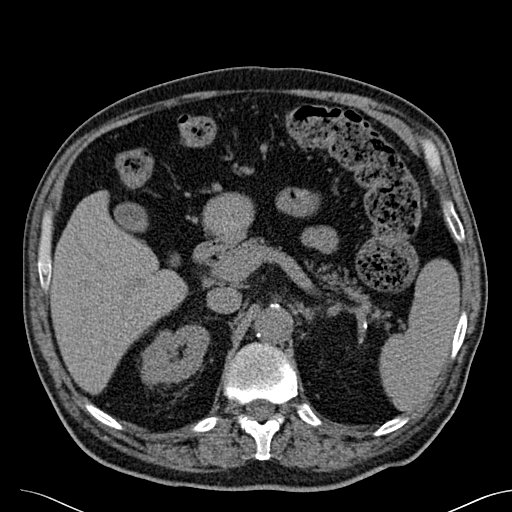
[im 9/116  lung]
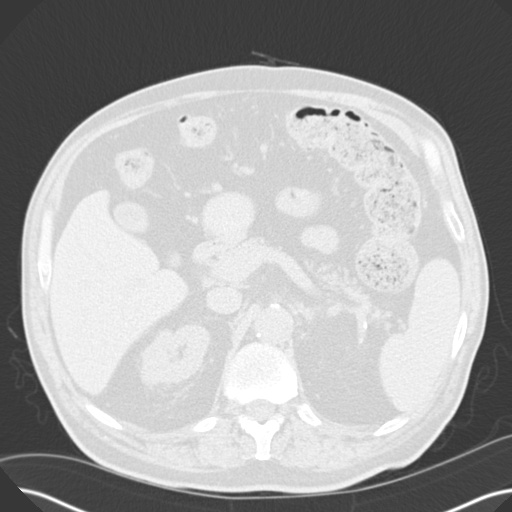
[im 18/116  lung]
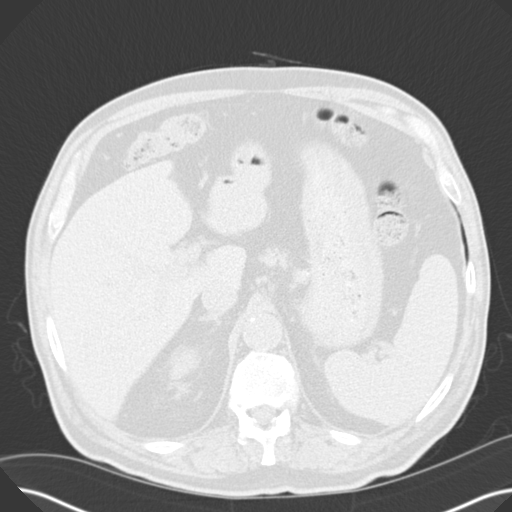
[im 24/116  lung]
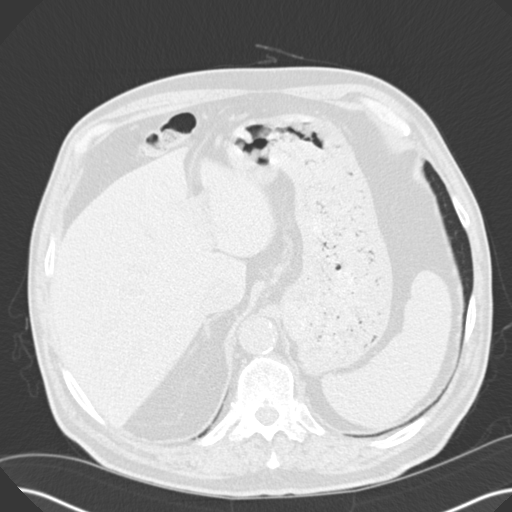
[im 30/116  lung]
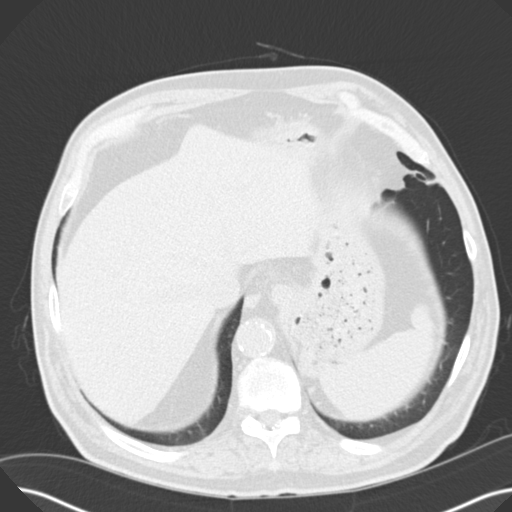
[im 39/116  mediastinal]
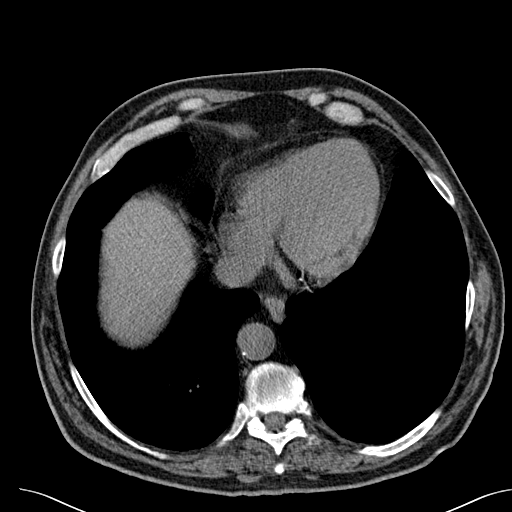
[im 39/116  lung]
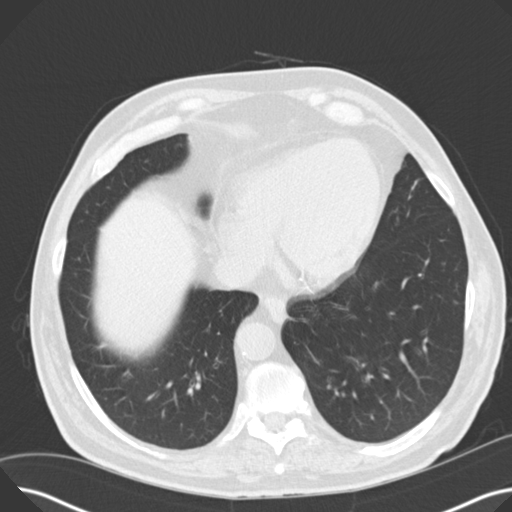
[im 47/116  lung]
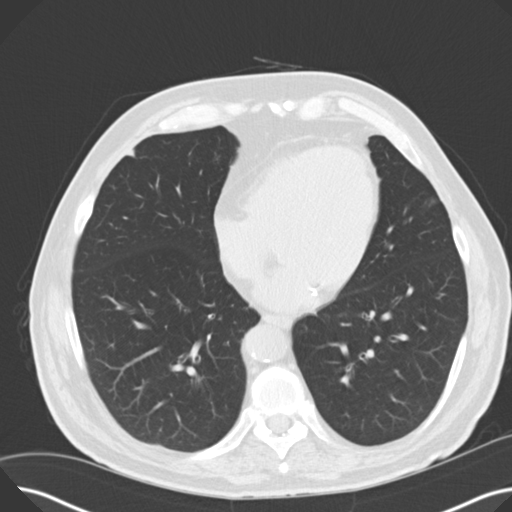
[im 56/116  lung]
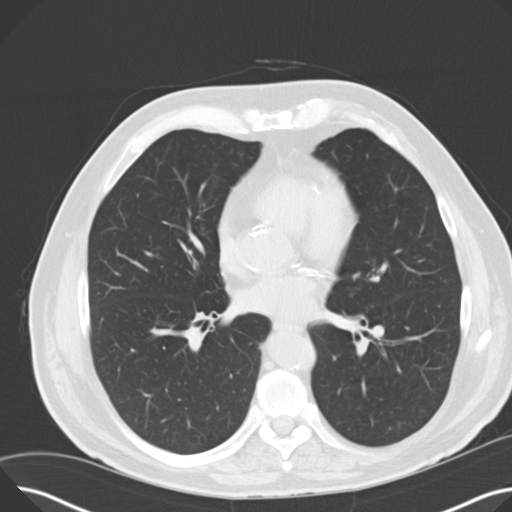
[im 61/116  lung]
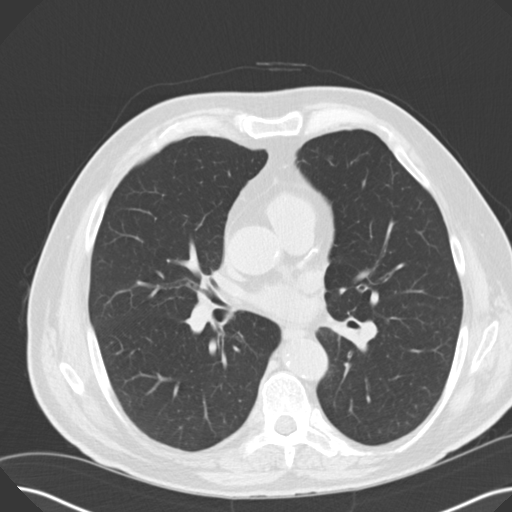
[im 69/116  mediastinal]
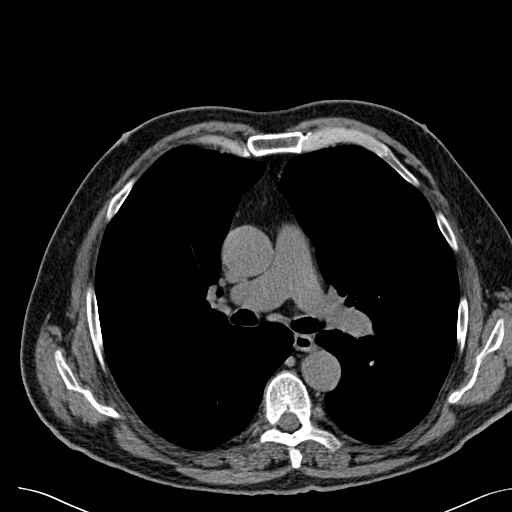
[im 69/116  lung]
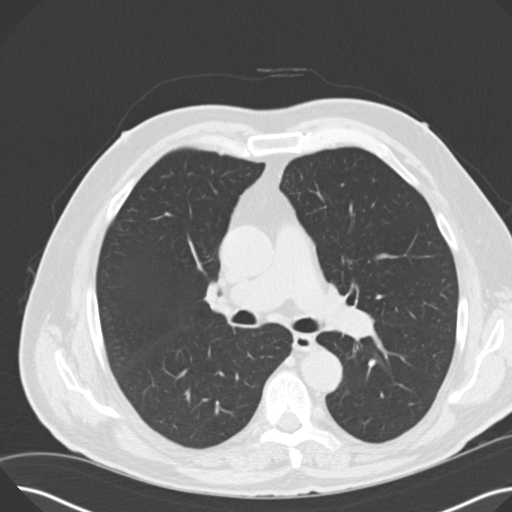
[im 73/116  lung]
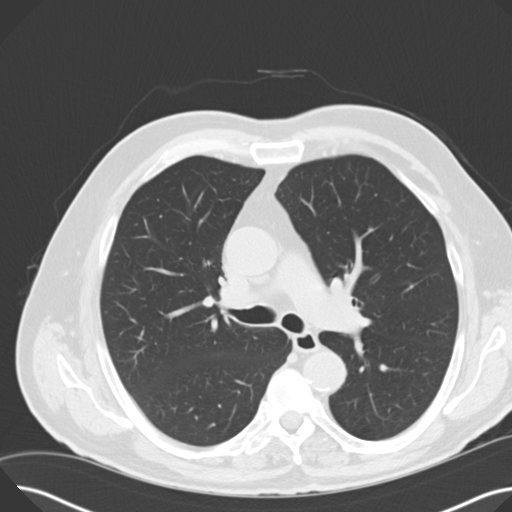
[im 81/116  lung]
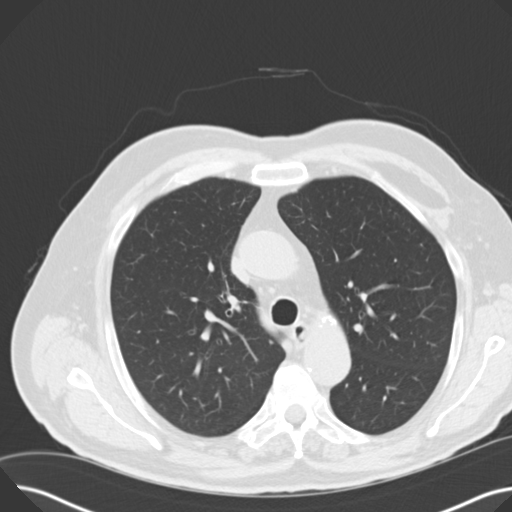
[im 90/116  lung]
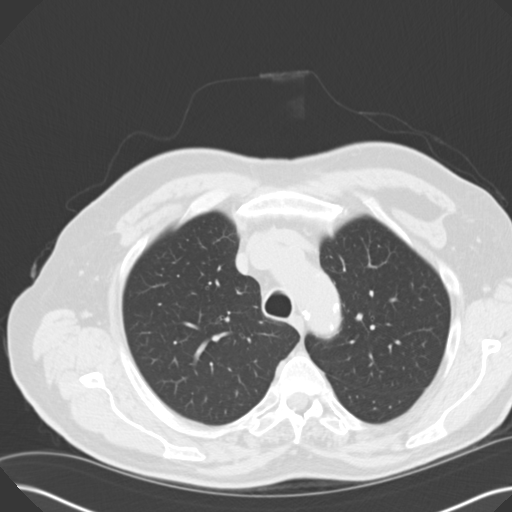
[im 94/116  mediastinal]
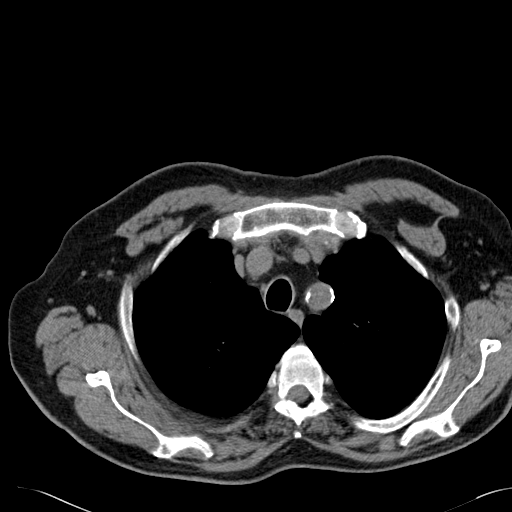
[im 94/116  lung]
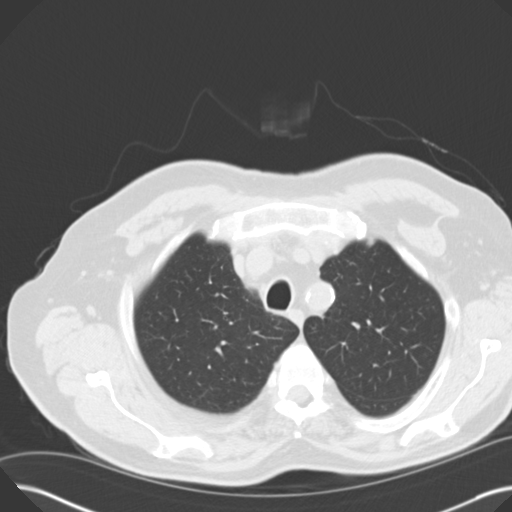
[im 103/116  lung]
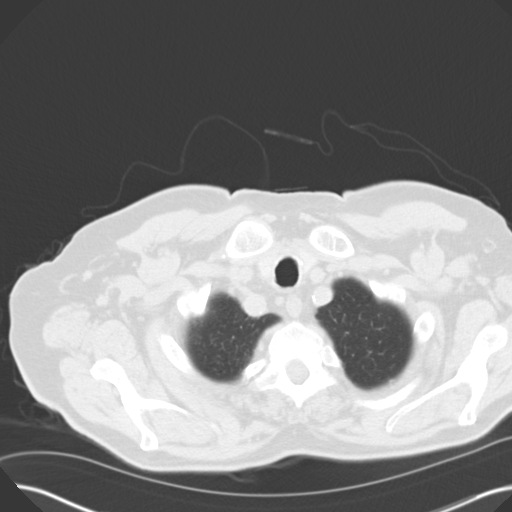
[im 111/116  lung]
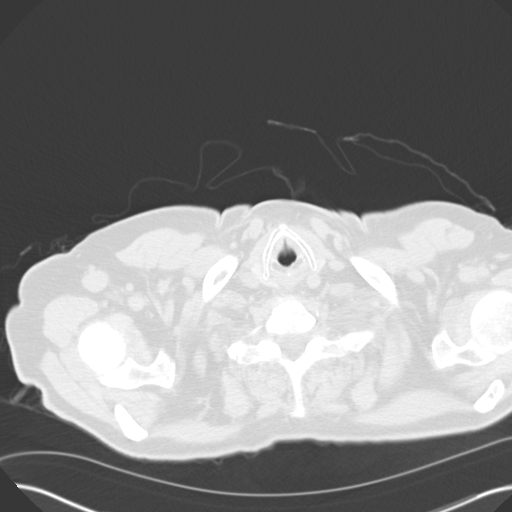

[15 of 32 positions shown; findings below may reference images not displayed]

PROCEDURE:     CT  - CT CHEST WITHOUT CONTRAST  - January 10, 2012 [DATE]

RESULT:     Chest CT without contrast is reconstructed at 3 mm slice
thickness in the axial plane with comparison made to images from 09 October, 2011, which were performed with contrast.

Lung window images demonstrate a smoothly marginated nodular density
laterally in the left lower lobe and measures 5.5 mm on image #79 and
appears stable compared to the previous study image #73. There is a right
middle lobe pleural based area of density anterolaterally measuring 5.8 mm
on image #70 and also appearing stable. The nodularity previously
demonstrated along the wall of the trachea is not evident on today's exam.
The lungs are otherwise clear. There is extensive atherosclerotic
calcification including the coronary arteries. The thoracic aorta is normal
in caliber. Nonenlarged pretracheal mediastinal lymph nodes are present. No
hilar adenopathy or mass is evident. The axillary and supraclavicular
regions appear unremarkable. The thyroid lobes appear within normal limits.
The included upper abdominal structures show cholelithiasis as noted
previously. The adrenal glands appear unremarkable.
IMPRESSION: 1. Stable densities in the right middle lobe and left lower lobe.
2. Atherosclerotic disease including the coronary arteries.
3. Cholelithiasis.
4. No evidence of tracheal nodularity suggested on the previous study.

[REDACTED]

## 2014-06-09 ENCOUNTER — Other Ambulatory Visit: Payer: Self-pay | Admitting: Internal Medicine

## 2014-06-11 DIAGNOSIS — N3281 Overactive bladder: Secondary | ICD-10-CM | POA: Diagnosis not present

## 2014-06-11 DIAGNOSIS — Z8546 Personal history of malignant neoplasm of prostate: Secondary | ICD-10-CM | POA: Diagnosis not present

## 2014-07-08 NOTE — Telephone Encounter (Signed)
Mailed unread message to pt  

## 2014-07-21 DIAGNOSIS — M4716 Other spondylosis with myelopathy, lumbar region: Secondary | ICD-10-CM | POA: Diagnosis not present

## 2014-07-24 ENCOUNTER — Encounter: Payer: Self-pay | Admitting: Internal Medicine

## 2014-08-09 NOTE — Consult Note (Signed)
Brief Consult Note: Diagnosis: Anemia.   Patient was seen by consultant.   Consult note dictated.   Orders entered.   Comments: Patient with significant anemia without signs of active bleeding. H and H better after transfusion. Patient has been on Plavix due to placement of coronary stent 09/12/ History of EGD and colonoscopy in 2011.  Recommendations: Continue to hold Plavix but will start baby ASA as there are no signs of active bleding. The optimal time to perform EGD and colonoscopy is difficult to determine and several options were discussed with him. As the patient has a bare metal stent placed more than 3 months ago, I believe it is safe to hold Plavix for 5 days before performing the procedures without significant risk of stenosis. This will allow the endoscopist to perform therapeutic interventions if needed without significant risk of bleeding. As patient wants Dr. Vira Agar to perform the procedures, I will communicate with Dr. Vira Agar on Monday for procedures to be done either Tuesday or Wednesday as OP. I expect the patient to be able to go home in am on baby ASA and PPI. Please do not hesitate to call me if there are any questions or concerns. Thanks.  Electronic Signatures: Jill Side (MD)  (Signed 05-Jan-13 12:12)  Authored: Brief Consult Note   Last Updated: 05-Jan-13 12:12 by Jill Side (MD)

## 2014-08-09 NOTE — H&P (Signed)
PATIENT NAME:  [name], [name] MR#:  [id number] DATE OF BIRTH:  05/10/1945  DATE OF ADMISSION:  04/21/2011  PRIMARY CARE PHYSICIAN: Deborra Medina, MD   CARDIOLOGIST: Esmond Plants, MD   ER REFERRING PHYSICIAN: Robb Matar, MD    CHIEF COMPLAINT: Fatigue, and he was sent by primary doctor for low hemoglobin.   HISTORY OF PRESENT ILLNESS: The patient is a 69 year old male with a history of hypertension, chronic obstructive pulmonary disease, and history of coronary artery disease-status post stents, also history of recent back surgery on 03/22/2011, came in because the patient's wife noted him to be with pale color with lots of fatigue; so he went to see his cardiologist and had blood work  drawn. Hemoglobin showed around 8, so he was asked to come to the ER for blood transfusion. The patient also was found to have low blood pressure with 80/40, and his Coreg was decreased, and also his Lasix is stopped. According to the wife, the medications were adjusted because of his low blood pressure. Today he came in because of low hemoglobin and fatigue. The patient denies any epistaxis. No melena. No history of vomiting or hematemesis.   PAST MEDICAL HISTORY: Significant for: 1. Hypertension.  2. Chronic obstructive pulmonary disease.  3. Coronary artery disease, status post stent.  4. History of chronic back pain, status post recent surgery. 5. Hyperlipidemia.  6. Benign prostatic hypertrophy.    SOCIAL HISTORY: History of smoking before, quit now. No alcohol. No drugs.   ALLERGIES: He is allergic to codeine, and he is allergic to morphine, Lyrica and prednisone.    MEDICATIONS:  1. Coreg is decreased from 12.5 to 6.25 b.i.d.  2. Plavix 75 mg p.o. daily.  3. Furosemide 40 mg daily.  4. Lisinopril 10 mg daily.  5. Omeprazole 40 mg daily.  6. Pyridium 200 mg p.o. t.i.d.  7. KCl 20 mEq p.o. daily.  8. Flomax 0.4 mg daily.  9. Simvastatin 40 mg at bedtime.  10. Spiriva 1 capsule inhalation which  is 80 mcg daily.  11. Advair 250/50, 1 puff b.i.d.  12. Symbicort 2 puffs b.i.d.  13. Robaxin 500 mg p.o. b.i.d.  14. Tramadol 50 mg every 6 hours.  15. Vicodin 5/500 every 6 hours for pain.  16. Multivitamins.  17. Aspirin.   PAST SURGICAL HISTORY: Significant for: 1. History of back surgery.  2. History of double hernia repair.  3. Foot surgeries.  4. History of recent back surgery.  5. History of coronary artery disease, status post stent in 2002.  6. History of  PCI   stent in  12/2010.  7. Recent L3-L4 fusion.   GI HISTORY: He restarted to take the Plavix after the surgery on September 5th. The patient also had a history of colon polyps, and his last colonoscopy was in 2011, which showed internal hemorrhoids and a sessile polyp in the transverse colon. This was done by Dr. Vira Agar. The patient's wants Dr. Vira Agar to see him for GI.  He also had endoscopy in April 2011. That one showed shortened Barrett's esophagus and normal stomach. He also had duodenal polyp.  REVIEW OF SYSTEMS: CONSTITUTIONAL: He has fatigue. EYES: No blurred vision. ENT: No tinnitus. No epistaxis. No difficulty swallowing. RESPIRATORY: No cough. No wheezing. CARDIOVASCULAR: No chest pain. No palpitations. GASTROINTESTINAL: No nausea. No vomiting. No abdominal pain. GENITOURINARY: No dysuria. ENDOCRINE: No polyuria or nocturia. HEMATOLOGIC: No easy bruising, has anemia now. INTEGUMENT: No skin rashes. Skin looks pale. MUSCULOSKELETAL: Denies any joint  pains except back pain. NEUROLOGICAL: No weakness. PSYCHIATRIC: No anxiety or insomnia.   PHYSICAL EXAMINATION:  VITAL SIGNS: Temperature 96.4. pulse 80, respirations 18, blood pressure 120/60, and saturations were 91% on 2 liters, 87% on room air. Initial blood pressure at home was 80/40 but improved to 120/80 in the ER.   GENERAL: He is awake, alert and oriented.   HEENT: Head is atraumatic, normocephalic. Pupils are equally reacting to light. Extraocular muscles  are intact. ENT: No tympanic membrane congestion. No turbinate hypertrophy.     NECK: Normal range of motion. No JVD. No carotid bruits.   CARDIOVASCULAR: S1, S2 regular. No murmurs.   LUNGS: Clear to auscultation. No wheeze noted, overall decreased breath sounds.   ABDOMEN: Soft, nontender, nondistended. Bowel sounds present.   EXTREMITIES: No extremity edema. No cyanosis. No clubbing.   NEUROLOGICAL: The patient is alert, awake, oriented. No focal neurological deficits.   PSYCHIATRIC: Oriented to time, place, person and not agitated.   LYMPH NODES: No lymphadenopathy.   LABORATORY, DIAGNOSTIC AND RADIOLOGICAL DATA:  Urinalysis showed amber cloudy urine with leukocyte esterase negative. Nitrites are positive. Bacteria trace. WBC 2.0.  Chest x-ray showed stable chest. Lungs are clear. Chronic obstructive pulmonary disease changes present.  Electrolytes: Sodium 145, potassium 4.6, chloride 111, bicarbonate 26, BUN 19, creatinine 1.0, glucose 114.  WBC 8.6, hemoglobin 8.4, hematocrit 25.3, platelet 222. BNP 1234. Troponin less than 0.02.  ASSESSMENT AND PLAN: The patient is a 69 year old male with: 1. Symptomatic anemia with fatigue: The patient's symptomatic anemia likely is from his slow GI bleed. We will admit the patient to telemetry, transfuse one unit of packed RBCs. Check a repeat hemoglobin. The patient has no obvious signs of bleeding, so will check stool for guaiac and anemia panel. He has history of  Barrett's esophagus and also polyps before, so we will get Dr. Vira Agar to see him to see if he needs to be scoped again. We will hold the aspirin and Plavix at this time. Continue IV Protonix.  2. Hypoxia: The patient has a history of chronic obstructive pulmonary disease, and even with the hypoxia he is not symptomatic. He is not having any shortness of breath. His BNP is elevated. He saw Dr. Rockey Situ recently, so we will request an echocardiogram report and Cardiology consult, but his  chest x-ray looked fine. He may need oxygen at home. Continue Advair and Spiriva for his chronic obstructive pulmonary disease to keep oxygen saturations around 88 to 90 given history of severe chronic obstructive pulmonary disease.  3. Hypotension: The patient's blood pressure medications have been adjusted recently, and his initial blood pressure is normal in the ER; so we will watch him on telemetry and restart the Coreg 3.125 p.o. b.i.d. and increase, if tolerated, but continue to hold the Lasix and lisinopril at this time.  4. History of benign prostatic hypertrophy: He is on tamsulosin, continue that.  5. History of chronic back pain with recent back surgery: On tramadol and Percocet, resume them.  6. Mild dehydration: The patient's Lasix and lisinopril are already on hold. I increased the p.o. intake and will not give any IV fluids at this time. Continue to watch his renal function closely.   I discussed the plan with the patient's wife. I will sign out to Dr. Derrel Nip.    TOTAL TIME SPENT ON HISTORY AND PHYSICAL:  60 minutes.  ____________________________ Epifanio Lesches, MD sk:cbb D: 04/21/2011 17:22:17 ET T: 04/21/2011 18:34:08 ET JOB#: [id number]  cc: Epifanio Lesches, MD, <  Dictator> Deborra Medina, MD Epifanio Lesches MD ELECTRONICALLY SIGNED 04/23/2011 21:39

## 2014-08-09 NOTE — Consult Note (Signed)
PATIENT NAME:  [name], [name] MR#:  [id number] DATE OF BIRTH:  09-14-1945  DATE OF CONSULTATION:  04/22/2011  REFERRING PHYSICIAN:  Deborra Medina, MD CONSULTING PHYSICIAN:  Jill Side, MD  REASON FOR CONSULTATION: Significant symptomatic anemia.   HISTORY OF PRESENT ILLNESS: This is a 69 year old male with history of hypertension, chronic obstructive pulmonary disease, history of coronary artery disease, status post coronary artery stents on multiple occasions. His last stenting was done in September of 2012. The patient had back surgery in December of 2012. Recently patient's wife has noted him to be pale. The patient has been complaining of feeling weak and fatigued. He was found to have a hemoglobin of only 8. He was somewhat hypotensive as well but that may have been due to his antihypertensive and diuretics. The patient denies any melena, bright red blood per rectum, nausea, vomiting, hematemesis. He has chronic constipation. Apparently patient underwent a colonoscopy in 2011 and a polyp was removed prior. Prior to that he had colonoscopy where 27 polyps were removed according to the wife. The patient also had an upper GI endoscopy in 2011 which was fairly unremarkable according to the patient.   PAST MEDICAL HISTORY: As above. Significant for hypertension, chronic obstructive pulmonary disease, coronary artery disease, status post bare metal stent placement in September of 2012, history of chronic back pain, status post surgery in December of 2012, hyperlipidemia,   BPH.    SOCIAL HISTORY: History of smoking in the past but not anymore.   ALLERGIES: Codeine, morphine, Lyrica, and prednisone.    MEDICINES AT HOME: Coreg, Plavix, Lasix, lisinopril, omeprazole, Pyridium, potassium chloride. Flomax, simvastatin, Spiriva, Advair, Symbicort, Robaxin, tramadol, Vicodin, multivitamin, and a regular aspirin.    REVIEW OF SYSTEMS: Grossly negative except for what is mentioned in the History of  Present Illness.   PHYSICAL EXAMINATION:  GENERAL: Fairly well-built male. Does not appear to be in any acute distress. Fully awake, alert, and oriented.   VITAL SIGNS: His vitals are fairly stable. Blood pressure 137/85, respirations 16, pulse is 60, temperature 97.7.   LUNGS: Clear to auscultation bilaterally with fair air entry and no added sounds.   CARDIOVASCULAR: Regular rate and rhythm. No gallops or murmur.   ABDOMEN: Soft and benign. Bowel sounds positive. Nontender, nondistended. No rebound or guarding.   EXTREMITIES: No edema, clubbing, or cyanosis.   NEUROLOGIC: Examination appears to be unremarkable.   LABORATORIES:  His hemoglobin on admission was 8.4; it is 9.4 after 1 unit of packed RBC transfusion. White cell count is normal at 9.2, platelet count is 203,000. Electrolytes are fairly unremarkable. Troponin less than 0.02. BNP is somewhat elevated at 1200. Iron saturation is normal at 29%. Liver enzymes are unremarkable.   ASSESSMENT AND PLAN: Patient with significant symptomatic anemia without signs of active gastrointestinal blood loss. Chronic slow gastrointestinal blood loss remains a concern. The patient had been on Plavix and aspirin due to recent placement of a coronary stent. I agree with withholding the Plavix, although since there are no signs of active GI bleeding we can start the patient on baby aspirin. Follow hemoglobin and hematocrit and transfuse if needed, although I expect the hemoglobin to remain stable. The optimal time to repeat the endoscopies is somewhat difficult to determine. As the patient had a bare metal stent placed more than three months ago I believe it would be safe to hold the Plavix for about five days without significantly increasing the risk of restenosis. This will allow the endoscopist to perform  therapeutic intervention such polypectomy or cauterization if needed during the procedures. As patient would like Dr. Vira Agar to perform the  endoscopies, I will discuss it with Dr. Vira Agar on Monday and plan on him having the procedures done either Tuesday or Wednesday next week. If patient's hemoglobin and hematocrit remain stable he could probably be discharged home tomorrow on baby aspirin.  He already takes omeprazole on home.  Continue to hold Plavix, and Dr. Percell [place] office will be in touch with him on Monday to make further plans. Please do not hesitate to call me if you have any questions or concerns. An early endoscopy can be done as soon as tomorrow, although patient may need repeat endoscopy after holding Plavix for several days. If a lesion is found that would require further treatment such as polypectomy, etc. Plan has been discussed with the patient and his wife, and they are in full agreement as well. We will follow while the patient is here.    Thank you so much, Dr. Derrel Nip, for involving me in the care of Mr. [place].     ____________________________ Jill Side, MD si:vtd D: 04/22/2011 12:19:36 ET T: 04/22/2011 14:04:31 ET JOB#: [id number]  cc: Jill Side, MD, <Dictator> Manya Silvas, MD Deborra Medina, MD Jill Side MD ELECTRONICALLY SIGNED 04/22/2011 14:37

## 2014-08-10 LAB — SURGICAL PATHOLOGY

## 2014-09-15 DIAGNOSIS — L720 Epidermal cyst: Secondary | ICD-10-CM | POA: Diagnosis not present

## 2014-09-15 DIAGNOSIS — L821 Other seborrheic keratosis: Secondary | ICD-10-CM | POA: Diagnosis not present

## 2014-10-05 ENCOUNTER — Telehealth: Payer: Self-pay | Admitting: Internal Medicine

## 2014-10-05 DIAGNOSIS — E785 Hyperlipidemia, unspecified: Secondary | ICD-10-CM

## 2014-10-05 DIAGNOSIS — Z79899 Other long term (current) drug therapy: Secondary | ICD-10-CM

## 2014-10-05 NOTE — Telephone Encounter (Signed)
Pt called about scheduling fasting labs no orders in chart. Please advise

## 2014-10-05 NOTE — Addendum Note (Signed)
Addended by: Crecencio Mc on: 10/05/2014 10:20 AM   Modules accepted: Orders

## 2014-11-17 ENCOUNTER — Other Ambulatory Visit (INDEPENDENT_AMBULATORY_CARE_PROVIDER_SITE_OTHER): Payer: Medicare Other

## 2014-11-17 DIAGNOSIS — E785 Hyperlipidemia, unspecified: Secondary | ICD-10-CM

## 2014-11-17 DIAGNOSIS — Z79899 Other long term (current) drug therapy: Secondary | ICD-10-CM | POA: Diagnosis not present

## 2014-11-17 LAB — COMPREHENSIVE METABOLIC PANEL
ALK PHOS: 87 U/L (ref 39–117)
ALT: 10 U/L (ref 0–53)
AST: 16 U/L (ref 0–37)
Albumin: 3.9 g/dL (ref 3.5–5.2)
BUN: 11 mg/dL (ref 6–23)
CALCIUM: 8.9 mg/dL (ref 8.4–10.5)
CHLORIDE: 107 meq/L (ref 96–112)
CO2: 28 mEq/L (ref 19–32)
Creatinine, Ser: 0.89 mg/dL (ref 0.40–1.50)
GFR: 89.56 mL/min (ref >60.00)
GLUCOSE: 88 mg/dL (ref 70–99)
Potassium: 3.8 mEq/L (ref 3.5–5.1)
Sodium: 142 mEq/L (ref 135–145)
Total Bilirubin: 0.7 mg/dL (ref 0.2–1.2)
Total Protein: 6.6 g/dL (ref 6.0–8.3)

## 2014-11-17 LAB — LIPID PANEL
CHOLESTEROL: 136 mg/dL (ref 0–200)
HDL: 40.6 mg/dL (ref >39.00)
LDL Cholesterol: 71 mg/dL (ref 0–99)
NONHDL: 95.74
Total CHOL/HDL Ratio: 3
Triglycerides: 126 mg/dL (ref 0.0–149.0)
VLDL: 25.2 mg/dL (ref 0.0–40.0)

## 2014-11-22 ENCOUNTER — Encounter: Payer: Self-pay | Admitting: Internal Medicine

## 2014-12-08 NOTE — Telephone Encounter (Signed)
Mailed patient unread my chart message

## 2015-01-11 ENCOUNTER — Ambulatory Visit (INDEPENDENT_AMBULATORY_CARE_PROVIDER_SITE_OTHER): Payer: Medicare Other | Admitting: Pulmonary Disease

## 2015-01-11 ENCOUNTER — Encounter: Payer: Self-pay | Admitting: Pulmonary Disease

## 2015-01-11 VITALS — BP 132/68 | HR 82 | Ht 73.0 in | Wt 193.0 lb

## 2015-01-11 DIAGNOSIS — J449 Chronic obstructive pulmonary disease, unspecified: Secondary | ICD-10-CM

## 2015-01-11 DIAGNOSIS — Z716 Tobacco abuse counseling: Secondary | ICD-10-CM | POA: Diagnosis not present

## 2015-01-11 DIAGNOSIS — Z23 Encounter for immunization: Secondary | ICD-10-CM

## 2015-01-11 DIAGNOSIS — I25110 Atherosclerotic heart disease of native coronary artery with unstable angina pectoris: Secondary | ICD-10-CM | POA: Diagnosis not present

## 2015-01-11 DIAGNOSIS — R911 Solitary pulmonary nodule: Secondary | ICD-10-CM

## 2015-01-11 NOTE — Assessment & Plan Note (Signed)
He did not follow-up with his CT scan in 2015. I advised him that we really need to get a repeat CT chest to ensure that the nodule has not increased in size. Plan; Repeat CT chest

## 2015-01-11 NOTE — Assessment & Plan Note (Signed)
This has been a stable interval for him but e continues to smoke. He has not had an exacerbation since he saw me last in 2014.  Counseled to quit smoking Continue Spiriva daily Flu shot today  Follow-up 6 months

## 2015-01-11 NOTE — Progress Notes (Signed)
Subjective:    Patient ID: [name], male    DOB: 13-Jan-1946, 69 y.o.   MRN: [id number]  Synopsis: [name] first saw the [facility] in July 2013 for a pulmonary nodule. He has a greater than 50-pack-year smoking history and quit in 2012. He was found on his initial visit to have gold stage II COPD but has minimal symptoms. The nodule was 6 mm and located in the left lower lobe there is also a 6 mm pleural-based nodule seen in the right middle lobe.  HPI  Chief Complaint  Patient presents with  . Follow-up    last seen 01/2013.  pt has no breathing complaints today.  last imaging 01/2013.     [name] is here at his wife's prompting.  He has no complaints from a respiratory standpoint.  His wife insisted that he come today for a check up .  He didn't have his 2015 CT scan.  His wife didn't actually tell him he had an appointment today.  He only uses Spiriva from time to time.  He coughs a lot.  He can keep up with his grandbaby.  No major changes.  No doctors visits since 2014.  He is smoking still, some days more than others.  He smokes 1/2 pack per day.     Past Medical History  Diagnosis Date  . Coronary artery disease   . COPD (chronic obstructive pulmonary disease)   . Hypertension   . Hyperlipidemia   . History of kidney stones   . History of prostate cancer   . MI (myocardial infarction) 03/2001  . Neuromuscular disorder      Review of Systems  Constitutional: Negative for fever, chills and fatigue.  HENT: Negative for congestion, rhinorrhea, sinus pressure and sneezing.   Respiratory: Negative for cough, shortness of breath and wheezing.   Cardiovascular: Negative for chest pain, palpitations and leg swelling.      Objective:   Physical Exam  Filed Vitals:   01/11/15 1322  BP: 132/68  Pulse: 82  Height: 6\' 1"  (1.854 m)  Weight: 193 lb (87.544 kg)  SpO2: 95%   Gen: well appearing, no acute distress HEENT: NCAT, EOMi, OP clear,  PULM: Few  fine inspiratory crackles throughout CV: RRR, no mgr, no JVD AB: BS+, soft, nontender, no hsm Ext: warm, no edema, no clubbing, no cyanosis  10/09/2011 CT chest [facility]: 6 mm round smooth solid pulmonary nodule in the left lower lobe 01/10/2012 CT chest [facility]: 6 mm nodule in LLL stable, stable 5.8 cm pleural based lesion RML 06/25/2012 CT Chest [facility] > 95mm nodule in LLL unchanged, scarring in lingula likely retained mucus     Assessment & Plan:   Tobacco abuse counseling  He was counseled to quit at length today  COPD (chronic obstructive pulmonary disease)  This has been a stable interval for him but e continues to smoke. He has not had an exacerbation since he saw me last in 2014.  Counseled to quit smoking Continue Spiriva daily Flu shot today  Follow-up 6 months  Pulmonary nodule  He did not follow-up with his CT scan in 2015. I advised him that we really need to get a repeat CT chest to ensure that the nodule has not increased in size. Plan; Repeat CT chest    Updated Medication List Outpatient Encounter Prescriptions as of 01/11/2015  Medication Sig  . albuterol (PROVENTIL HFA;VENTOLIN HFA) 108 (90 BASE) MCG/ACT inhaler Inhale 2 puffs into the lungs every  6 (six) hours as needed for wheezing.  [name] aspirin 81 MG tablet Take 81 mg by mouth daily.  [name] atorvastatin (LIPITOR) 40 MG tablet Take 1 tablet (40 mg total) by mouth daily at 6 PM.  . methocarbamol (ROBAXIN) 500 MG tablet Take 1 tablet (500 mg total) by mouth 2 (two) times daily.  [name] omeprazole (PRILOSEC) 40 MG capsule TAKE ONE (1) CAPSULE EACH DAY  . polyethylene glycol powder (GLYCOLAX/MIRALAX) powder Take 17 g by mouth as needed.   . tiotropium (SPIRIVA) 18 MCG inhalation capsule Place 18 mcg into inhaler and inhale daily.  . traMADol (ULTRAM) 50 MG tablet Take 1 tablet (50 mg total) by mouth 4 (four) times daily as needed.   No facility-administered encounter medications on file as of 01/11/2015.

## 2015-01-11 NOTE — Patient Instructions (Signed)
Stop smoking You Spiriva daily no matter how you feel We will order a CT scan of your chest to follow-up the pulmonary nodule We will see you back in 6 months or sooner if needed

## 2015-01-11 NOTE — Assessment & Plan Note (Signed)
He was counseled to quit at length today

## 2015-01-13 ENCOUNTER — Ambulatory Visit
Admission: RE | Admit: 2015-01-13 | Discharge: 2015-01-13 | Disposition: A | Discharge status: Home / Self Care | Payer: Medicare Other | Source: Ambulatory Visit | Attending: Pulmonary Disease | Admitting: Pulmonary Disease

## 2015-01-13 DIAGNOSIS — R911 Solitary pulmonary nodule: Secondary | ICD-10-CM | POA: Insufficient documentation

## 2015-01-13 DIAGNOSIS — I700 Atherosclerosis of aorta: Secondary | ICD-10-CM | POA: Diagnosis not present

## 2015-01-13 DIAGNOSIS — I2510 Atherosclerotic heart disease of native coronary artery without angina pectoris: Secondary | ICD-10-CM | POA: Insufficient documentation

## 2015-01-19 DIAGNOSIS — M4716 Other spondylosis with myelopathy, lumbar region: Secondary | ICD-10-CM | POA: Diagnosis not present

## 2015-01-22 ENCOUNTER — Other Ambulatory Visit: Payer: Self-pay

## 2015-01-22 ENCOUNTER — Encounter: Payer: Self-pay | Admitting: Nurse Practitioner

## 2015-01-22 ENCOUNTER — Ambulatory Visit (INDEPENDENT_AMBULATORY_CARE_PROVIDER_SITE_OTHER): Payer: Medicare Other | Admitting: Nurse Practitioner

## 2015-01-22 VITALS — BP 138/78 | HR 75 | Temp 98.6°F | Resp 18 | Ht 73.0 in | Wt 190.4 lb

## 2015-01-22 DIAGNOSIS — I25110 Atherosclerotic heart disease of native coronary artery with unstable angina pectoris: Secondary | ICD-10-CM

## 2015-01-22 DIAGNOSIS — J069 Acute upper respiratory infection, unspecified: Secondary | ICD-10-CM | POA: Diagnosis not present

## 2015-01-22 DIAGNOSIS — Z716 Tobacco abuse counseling: Secondary | ICD-10-CM

## 2015-01-22 MED ORDER — GUAIFENESIN-CODEINE 100-10 MG/5ML PO SYRP: 5.0000 mL | ORAL_SOLUTION | Freq: Every day | ORAL | Status: DC

## 2015-01-22 MED ORDER — OMEPRAZOLE 40 MG PO CPDR: 40.0000 mg | DELAYED_RELEASE_CAPSULE | Freq: Every day | ORAL | Status: DC

## 2015-01-22 NOTE — Assessment & Plan Note (Signed)
Will try Cheratussin and encouraged mucinex and flonase. Asked pt to call back if worsening or failure to improve after 7 days. Will follow.

## 2015-01-22 NOTE — Progress Notes (Signed)
Pre visit review using our clinic review tool, if applicable. No additional management support is needed unless otherwise documented below in the visit note. 

## 2015-01-22 NOTE — Assessment & Plan Note (Signed)
Still not ready to quit, but has not smoked in 2 days due to cold symptoms.

## 2015-01-22 NOTE — Progress Notes (Signed)
Patient ID: [name], male    DOB: 1945/11/28  Age: 69 y.o. MRN: [id number]  CC: URI   HPI [name] presents for URI x 3 days.   1) Cough- green, rhinorrhea, achy, watery eyes, drainage, sore throat x 3 days.   Mucinex and Chloraseptic - somewhat helpful   Keeps two grandchildren and one attends daycare.  History [name] has a past medical history of Coronary artery disease; COPD (chronic obstructive pulmonary disease) ([place]); Hypertension; Hyperlipidemia; History of kidney stones; History of prostate cancer; MI (myocardial infarction) ([place]) (03/2001); and Neuromuscular disorder ([place]).   [name] has past surgical history that includes Prostate surgery (09/2009); Back surgery (2009/2010); Hernia repair; Foot surgery; Knee surgery; Cardiac catheterization (2002); and Spine surgery.   His family history includes Diabetes in his mother; Heart attack in his mother; Heart disease in his father.[name] reports that [name] has been smoking Cigarettes.  [name] has a 55 pack-year smoking history. [name] has never used smokeless tobacco. [name] reports that [name] does not drink alcohol or use illicit drugs.  Outpatient Prescriptions Prior to Visit  Medication Sig Dispense Refill  . aspirin 81 MG tablet Take 81 mg by mouth daily.    [name] atorvastatin (LIPITOR) 40 MG tablet Take 1 tablet (40 mg total) by mouth daily at 6 PM. 90 tablet 3  . methocarbamol (ROBAXIN) 500 MG tablet Take 1 tablet (500 mg total) by mouth 2 (two) times daily. 180 tablet 3  . omeprazole (PRILOSEC) 40 MG capsule TAKE ONE (1) CAPSULE EACH DAY 90 capsule 1  . polyethylene glycol powder (GLYCOLAX/MIRALAX) powder Take 17 g by mouth as needed.     . tiotropium (SPIRIVA) 18 MCG inhalation capsule Place 18 mcg into inhaler and inhale daily.    . traMADol (ULTRAM) 50 MG tablet Take 1 tablet (50 mg total) by mouth 4 (four) times daily as needed. 120 tablet 3  . albuterol (PROVENTIL HFA;VENTOLIN HFA) 108 (90 BASE) MCG/ACT inhaler Inhale 2 puffs into the lungs  every 6 (six) hours as needed for wheezing. 1 Inhaler 0   No facility-administered medications prior to visit.    ROS Review of Systems  Constitutional: Negative for fever, chills, diaphoresis and fatigue.  HENT: Positive for congestion, ear pain, postnasal drip, rhinorrhea and sneezing. Negative for ear discharge, sinus pressure, sore throat, tinnitus, trouble swallowing and voice change.   Eyes: Positive for discharge and itching. Negative for visual disturbance.  Respiratory: Positive for cough. Negative for chest tightness, shortness of breath and wheezing.   Cardiovascular: Negative for chest pain, palpitations and leg swelling.  Gastrointestinal: Negative for nausea, vomiting and diarrhea.  Skin: Negative for rash.  Neurological: Positive for headaches. Negative for dizziness, weakness and numbness.  Psychiatric/Behavioral: The patient is not nervous/anxious.     Objective:  BP 138/78 mmHg  Pulse 75  Temp(Src) 98.6 F (37 C)  Resp 18  Ht 6\' 1"  (1.854 m)  Wt 190 lb 6.4 oz (86.365 kg)  BMI 25.13 kg/m2  SpO2 96%  Physical Exam  Constitutional: [name] is oriented to person, place, and time. [name] appears well-developed and well-nourished. No distress.  HENT:  Head: Normocephalic and atraumatic.  Right Ear: External ear normal.  Left Ear: External ear normal.  Mouth/Throat: No oropharyngeal exudate.  TM's clear bilaterally Boggy turbinates with rhinorrhea-clear  Eyes: Conjunctivae and EOM are normal. Pupils are equal, round, and reactive to light. Right eye exhibits no discharge. Left eye exhibits no discharge. No scleral icterus.  Neck: Normal range of motion.  Neck supple. No thyromegaly present.  Cardiovascular: Normal rate, regular rhythm and normal heart sounds.  Exam reveals no gallop and no friction rub.   No murmur heard. Pulmonary/Chest: Effort normal and breath sounds normal. No respiratory distress. [name] has no wheezes. [name] has no rales. [name] exhibits no tenderness.   Lymphadenopathy:    [name] has no cervical adenopathy.  Neurological: [name] is alert and oriented to person, place, and time.  Skin: Skin is warm and dry. No rash noted. [name] is not diaphoretic.  Psychiatric: [name] has a normal mood and affect. His behavior is normal. Judgment and thought content normal.   Assessment & Plan:   [name] was seen today for uri.  Diagnoses and all orders for this visit:  Tobacco abuse counseling  URI, acute  Other orders -     guaiFENesin-codeine (ROBITUSSIN AC) 100-10 MG/5ML syrup; Take 5 mLs by mouth at bedtime.   I am having [name] start on guaiFENesin-codeine. I am also having him maintain his methocarbamol, traMADol, polyethylene glycol powder, albuterol, tiotropium, aspirin, atorvastatin, and omeprazole.  Meds ordered this encounter  Medications  . guaiFENesin-codeine (ROBITUSSIN AC) 100-10 MG/5ML syrup    Sig: Take 5 mLs by mouth at bedtime.    Dispense:  180 mL    Refill:  0    Order Specific Question:  Supervising Provider    Answer:  Crecencio Mc [[id number]]     Follow-up: Return if symptoms worsen or fail to improve.

## 2015-01-22 NOTE — Patient Instructions (Signed)
Mucinex, Cheratussin at night time 1 teaspoon, and Flonase nasal spray  Upper Respiratory Infection, Adult Most upper respiratory infections (URIs) are a viral infection of the air passages leading to the lungs. A URI affects the nose, throat, and upper air passages. The most common type of URI is nasopharyngitis and is typically referred to as "the common cold." URIs run their course and usually go away on their own. Most of the time, a URI does not require medical attention, but sometimes a bacterial infection in the upper airways can follow a viral infection. This is called a secondary infection. Sinus and middle ear infections are common types of secondary upper respiratory infections. Bacterial pneumonia can also complicate a URI. A URI can worsen asthma and chronic obstructive pulmonary disease (COPD). Sometimes, these complications can require emergency medical care and may be life threatening.  CAUSES Almost all URIs are caused by viruses. A virus is a type of germ and can spread from one person to another.  RISKS FACTORS You may be at risk for a URI if:   You smoke.   You have chronic heart or lung disease.  You have a weakened defense (immune) system.   You are very young or very old.   You have nasal allergies or asthma.  You work in crowded or poorly ventilated areas.  You work in health care facilities or schools. SIGNS AND SYMPTOMS  Symptoms typically develop 2-3 days after you come in contact with a cold virus. Most viral URIs last 7-10 days. However, viral URIs from the influenza virus (flu virus) can last 14-18 days and are typically more severe. Symptoms may include:   Runny or stuffy (congested) nose.   Sneezing.   Cough.   Sore throat.   Headache.   Fatigue.   Fever.   Loss of appetite.   Pain in your forehead, behind your eyes, and over your cheekbones (sinus pain).  Muscle aches.  DIAGNOSIS  Your health care provider may diagnose a URI  by:  Physical exam.  Tests to check that your symptoms are not due to another condition such as:  Strep throat.  Sinusitis.  Pneumonia.  Asthma. TREATMENT  A URI goes away on its own with time. It cannot be cured with medicines, but medicines may be prescribed or recommended to relieve symptoms. Medicines may help:  Reduce your fever.  Reduce your cough.  Relieve nasal congestion. HOME CARE INSTRUCTIONS   Take medicines only as directed by your health care provider.   Gargle warm saltwater or take cough drops to comfort your throat as directed by your health care provider.  Use a warm mist humidifier or inhale steam from a shower to increase air moisture. This may make it easier to breathe.  Drink enough fluid to keep your urine clear or pale yellow.   Eat soups and other clear broths and maintain good nutrition.   Rest as needed.   Return to work when your temperature has returned to normal or as your health care provider advises. You may need to stay home longer to avoid infecting others. You can also use a face mask and careful hand washing to prevent spread of the virus.  Increase the usage of your inhaler if you have asthma.   Do not use any tobacco products, including cigarettes, chewing tobacco, or electronic cigarettes. If you need help quitting, ask your health care provider. PREVENTION  The best way to protect yourself from getting a cold is to practice good  hygiene.   Avoid oral or hand contact with people with cold symptoms.   Wash your hands often if contact occurs.  There is no clear evidence that vitamin C, vitamin E, echinacea, or exercise reduces the chance of developing a cold. However, it is always recommended to get plenty of rest, exercise, and practice good nutrition.  SEEK MEDICAL CARE IF:   You are getting worse rather than better.   Your symptoms are not controlled by medicine.   You have chills.  You have worsening shortness of  breath.  You have brown or red mucus.  You have yellow or brown nasal discharge.  You have pain in your face, especially when you bend forward.  You have a fever.  You have swollen neck glands.  You have pain while swallowing.  You have white areas in the back of your throat. SEEK IMMEDIATE MEDICAL CARE IF:   You have severe or persistent:  Headache.  Ear pain.  Sinus pain.  Chest pain.  You have chronic lung disease and any of the following:  Wheezing.  Prolonged cough.  Coughing up blood.  A change in your usual mucus.  You have a stiff neck.  You have changes in your:  Vision.  Hearing.  Thinking.  Mood. MAKE SURE YOU:   Understand these instructions.  Will watch your condition.  Will get help right away if you are not doing well or get worse.   This information is not intended to replace advice given to you by your health care provider. Make sure you discuss any questions you have with your health care provider.   Document Released: 09/27/2000 Document Revised: 08/18/2014 Document Reviewed: 07/09/2013 Elsevier Interactive Patient Education [employer].

## 2015-02-15 DIAGNOSIS — D485 Neoplasm of uncertain behavior of skin: Secondary | ICD-10-CM | POA: Diagnosis not present

## 2015-02-15 DIAGNOSIS — L821 Other seborrheic keratosis: Secondary | ICD-10-CM | POA: Diagnosis not present

## 2015-02-15 DIAGNOSIS — C44321 Squamous cell carcinoma of skin of nose: Secondary | ICD-10-CM | POA: Diagnosis not present

## 2015-02-24 DIAGNOSIS — G8918 Other acute postprocedural pain: Secondary | ICD-10-CM | POA: Diagnosis not present

## 2015-02-24 DIAGNOSIS — L578 Other skin changes due to chronic exposure to nonionizing radiation: Secondary | ICD-10-CM | POA: Diagnosis not present

## 2015-02-24 DIAGNOSIS — C44321 Squamous cell carcinoma of skin of nose: Secondary | ICD-10-CM | POA: Diagnosis not present

## 2015-02-24 DIAGNOSIS — L814 Other melanin hyperpigmentation: Secondary | ICD-10-CM | POA: Diagnosis not present

## 2015-02-24 DIAGNOSIS — L908 Other atrophic disorders of skin: Secondary | ICD-10-CM | POA: Diagnosis not present

## 2015-03-17 ENCOUNTER — Telehealth: Payer: Self-pay | Admitting: Internal Medicine

## 2015-03-17 ENCOUNTER — Other Ambulatory Visit: Payer: Self-pay

## 2015-03-17 NOTE — Telephone Encounter (Signed)
Pt requesting spiriva last given 02/2012

## 2015-03-17 NOTE — Telephone Encounter (Signed)
Pt came in wanting to get a refill on tiotropium (SPIRIVA) 18 MCG inhalation capsule.. Please advise pt

## 2015-03-19 ENCOUNTER — Other Ambulatory Visit: Payer: Self-pay | Admitting: Nurse Practitioner

## 2015-03-19 MED ORDER — TIOTROPIUM BROMIDE MONOHYDRATE 18 MCG IN CAPS: 18.0000 ug | ORAL_CAPSULE | Freq: Every day | RESPIRATORY_TRACT | Status: DC

## 2015-03-19 NOTE — Telephone Encounter (Signed)
Ordered and sent to pharmacy. Thanks!

## 2015-03-31 ENCOUNTER — Ambulatory Visit (INDEPENDENT_AMBULATORY_CARE_PROVIDER_SITE_OTHER): Payer: Medicare Other | Admitting: Internal Medicine

## 2015-03-31 ENCOUNTER — Encounter: Payer: Self-pay | Admitting: Internal Medicine

## 2015-03-31 VITALS — BP 104/60 | HR 77 | Temp 97.6°F | Resp 12 | Ht 73.0 in | Wt 192.0 lb

## 2015-03-31 DIAGNOSIS — J441 Chronic obstructive pulmonary disease with (acute) exacerbation: Secondary | ICD-10-CM | POA: Diagnosis not present

## 2015-03-31 DIAGNOSIS — I25110 Atherosclerotic heart disease of native coronary artery with unstable angina pectoris: Secondary | ICD-10-CM | POA: Diagnosis not present

## 2015-03-31 DIAGNOSIS — R6889 Other general symptoms and signs: Secondary | ICD-10-CM

## 2015-03-31 LAB — POCT INFLUENZA A/B
Influenza A, POC: NEGATIVE
Influenza B, POC: NEGATIVE

## 2015-03-31 MED ORDER — GUAIFENESIN-CODEINE 100-10 MG/5ML PO SYRP: 5.0000 mL | ORAL_SOLUTION | Freq: Three times a day (TID) | ORAL | Status: DC | PRN

## 2015-03-31 MED ORDER — LEVOFLOXACIN 500 MG PO TABS: 500.0000 mg | ORAL_TABLET | Freq: Every day | ORAL | Status: DC

## 2015-03-31 MED ORDER — METHYLPREDNISOLONE ACETATE 40 MG/ML IJ SUSP
40.0000 mg | Freq: Once | INTRAMUSCULAR | Status: AC
  Administered 2015-03-31: 40 mg via INTRAMUSCULAR

## 2015-03-31 NOTE — Patient Instructions (Addendum)
You are having a mild COPD exacerbation .  We will calll you with the results of the flu test   I am treating you  with the following:  IM injection of steroids    Levaquin 1 tablet daily for 7 days  Continue your spiriva daily .  Use your albuterol inhaler as needed Take Delsym for cough, cheratussin if needed    Please take a probiotic ( Align, Floraque or Culturelle), or  the generic version of one of these  For a minimum of 3 weeks to prevent a serious antibiotic associated diarrhea  Called clostridium dificile colitis

## 2015-04-03 ENCOUNTER — Encounter: Payer: Self-pay | Admitting: Internal Medicine

## 2015-04-03 DIAGNOSIS — J441 Chronic obstructive pulmonary disease with (acute) exacerbation: Secondary | ICD-10-CM | POA: Insufficient documentation

## 2015-04-03 NOTE — Progress Notes (Signed)
Subjective:  Patient ID: [name], male    DOB: 01-03-1946  Age: 69 y.o. MRN: [id number]  CC: The encounter diagnosis was Flu-like symptoms.  HPI [name] presents for COPD exacerbation.Symptoms include productive cough and shortness of breath accompanied by miuld myalgias and low grade fevers.  No chestt pain .  Positive sick contacts at home,  Still smoking   Outpatient Prescriptions Prior to Visit  Medication Sig Dispense Refill  . aspirin 81 MG tablet Take 81 mg by mouth daily.    [name] atorvastatin (LIPITOR) 40 MG tablet Take 1 tablet (40 mg total) by mouth daily at 6 PM. 90 tablet 3  . methocarbamol (ROBAXIN) 500 MG tablet Take 1 tablet (500 mg total) by mouth 2 (two) times daily. 180 tablet 3  . omeprazole (PRILOSEC) 40 MG capsule Take 1 capsule (40 mg total) by mouth daily. 90 capsule 1  . polyethylene glycol powder (GLYCOLAX/MIRALAX) powder Take 17 g by mouth as needed.     . tiotropium (SPIRIVA) 18 MCG inhalation capsule Place 1 capsule (18 mcg total) into inhaler and inhale daily. 30 capsule 0  . traMADol (ULTRAM) 50 MG tablet Take 1 tablet (50 mg total) by mouth 4 (four) times daily as needed. 120 tablet 3  . albuterol (PROVENTIL HFA;VENTOLIN HFA) 108 (90 BASE) MCG/ACT inhaler Inhale 2 puffs into the lungs every 6 (six) hours as needed for wheezing. 1 Inhaler 0  . guaiFENesin-codeine (ROBITUSSIN AC) 100-10 MG/5ML syrup Take 5 mLs by mouth at bedtime. (Patient not taking: Reported on 03/31/2015) 180 mL 0   No facility-administered medications prior to visit.    Review of Systems;  Patient denies headache, fevers, malaise, unintentional weight loss, skin rash, eye pain, sinus congestion and sinus pain, sore throat, dysphagia,  hemoptysis , cough, dyspnea, wheezing, chest pain, palpitations, orthopnea, edema, abdominal pain, nausea, melena, diarrhea, constipation, flank pain, dysuria, hematuria, urinary  Frequency, nocturia, numbness, tingling, seizures,  Focal weakness,  Loss of consciousness,  Tremor, insomnia, depression, anxiety, and suicidal ideation.      Objective:  BP 104/60 mmHg  Pulse 77  Temp(Src) 97.6 F (36.4 C) (Oral)  Resp 12  Ht 6\' 1"  (1.854 m)  Wt 192 lb (87.091 kg)  BMI 25.34 kg/m2  SpO2 93%  BP Readings from Last 3 Encounters:  03/31/15 104/60  01/22/15 138/78  01/11/15 132/68    Wt Readings from Last 3 Encounters:  03/31/15 192 lb (87.091 kg)  01/22/15 190 lb 6.4 oz (86.365 kg)  01/11/15 193 lb (87.544 kg)    General appearance: alert, cooperative and appears stated age Ears: normal TM's and external ear canals both ears Throat: lips, mucosa, and tongue normal; teeth and gums normal Neck: no adenopathy, no carotid bruit, supple, symmetrical, trachea midline and thyroid not enlarged, symmetric, no tenderness/mass/nodules Back: symmetric, no curvature. ROM normal. No CVA tenderness. Lungs: bilateral ronchi with occasional wheezing ,  Fair air movement bilaterally Heart: regular rate and rhythm, S1, S2 normal, no murmur, click, rub or gallop Abdomen: soft, non-tender; bowel sounds normal; no masses,  no organomegaly Pulses: 2+ and symmetric Skin: Skin color, texture, turgor normal. No rashes or lesions Lymph nodes: Cervical, supraclavicular, and axillary nodes normal.  No results found for: HGBA1C  Lab Results  Component Value Date   CREATININE 0.89 11/17/2014   CREATININE 0.88 05/20/2014   CREATININE 1.1 12/27/2011    Lab Results  Component Value Date   WBC 9.3 05/20/2014   HGB 14.5 05/20/2014   HCT  42.9 05/20/2014   PLT 191.0 05/20/2014   GLUCOSE 88 11/17/2014   CHOL 136 11/17/2014   TRIG 126.0 11/17/2014   HDL 40.60 11/17/2014   LDLCALC 71 11/17/2014   ALT 10 11/17/2014   AST 16 11/17/2014   NA 142 11/17/2014   K 3.8 11/17/2014   CL 107 11/17/2014   CREATININE 0.89 11/17/2014   BUN 11 11/17/2014   CO2 28 11/17/2014   TSH 2.89 05/20/2014   INR 1.06 01/03/2011    Ct Chest Wo Contrast  01/13/2015   CLINICAL DATA:  Followup pulmonary nodule. History of prostate cancer. Smoker. EXAM: CT CHEST WITHOUT CONTRAST TECHNIQUE: Multidetector CT imaging of the chest was performed following the standard protocol without IV contrast. COMPARISON:  06/25/2012 FINDINGS: Mediastinum: Heart size is normal. No pericardial effusion. Aortic atherosclerosis identified. There is calcification within the LAD, left circumflex coronary arteries. No mediastinal or hilar adenopathy identified. Lungs/Pleura: No pleural effusion. There is a scar like opacity within the lingular portion of the left lung, unchanged from previous exam. Pulmonary nodule in the left lower lobe measures 6 mm, image 45 of series 4. This is unchanged when compared with previous exam. No additional pulmonary nodules or masses. Upper Abdomen: The visualized portions of the liver are normal. The adrenal glands are unremarkable. Musculoskeletal: Spondylosis noted within the thoracic spine. No aggressive lytic or sclerotic bone lesions. IMPRESSION: 1. Stable left lower lobe pulmonary nodule measuring 6 mm. This is been unchanged since the study dated 01/10/2012 and is compatible with a benign process. No suspicious nodules are identified at this time and no further followup is indicated. This recommendation follows the consensus statement: Guidelines for Management of Small Pulmonary Nodules Detected on CT Scans: A Statement from the Fleischner Society as published in Radiology 2005; 237:395-400. 2. Aortic atherosclerosis and multi vessel coronary artery calcification. Electronically Signed   By: Kerby Moors M.D.   On: 01/13/2015 14:49    Assessment & Plan:   Problem List Items Addressed This Visit    None    Visit Diagnoses    Flu-like symptoms    -  Primary    Relevant Medications    methylPREDNISolone acetate (DEPO-MEDROL) injection 40 mg (Completed)    Other Relevant Orders    POCT Influenza A/B (Completed)       I am having Mr. Armond start  on levofloxacin and guaiFENesin-codeine. I am also having him maintain his methocarbamol, traMADol, polyethylene glycol powder, albuterol, aspirin, atorvastatin, guaiFENesin-codeine, omeprazole, and tiotropium. We administered methylPREDNISolone acetate.  Meds ordered this encounter  Medications  . levofloxacin (LEVAQUIN) 500 MG tablet    Sig: Take 1 tablet (500 mg total) by mouth daily.    Dispense:  7 tablet    Refill:  0  . guaiFENesin-codeine (ROBITUSSIN AC) 100-10 MG/5ML syrup    Sig: Take 5 mLs by mouth 3 (three) times daily as needed for cough.    Dispense:  180 mL    Refill:  0  . methylPREDNISolone acetate (DEPO-MEDROL) injection 40 mg    Sig:     There are no discontinued medications.  Follow-up: No Follow-up on file.   Crecencio Mc, MD

## 2015-04-03 NOTE — Assessment & Plan Note (Signed)
Current presentation suggest viral etiology. However he has COPD.  Will treat supportively,  Advised to start the antibiotic and steroids. Continue inhaled medications.

## 2015-04-14 ENCOUNTER — Encounter: Payer: Self-pay | Admitting: *Deleted

## 2015-05-10 ENCOUNTER — Ambulatory Visit (INDEPENDENT_AMBULATORY_CARE_PROVIDER_SITE_OTHER): Payer: Medicare Other | Admitting: Cardiovascular Disease

## 2015-05-10 ENCOUNTER — Encounter: Payer: Self-pay | Admitting: Cardiovascular Disease

## 2015-05-10 VITALS — BP 150/86 | HR 60 | Ht 72.0 in | Wt 193.5 lb

## 2015-05-10 DIAGNOSIS — J449 Chronic obstructive pulmonary disease, unspecified: Secondary | ICD-10-CM

## 2015-05-10 DIAGNOSIS — I10 Essential (primary) hypertension: Secondary | ICD-10-CM | POA: Diagnosis not present

## 2015-05-10 DIAGNOSIS — I25110 Atherosclerotic heart disease of native coronary artery with unstable angina pectoris: Secondary | ICD-10-CM

## 2015-05-10 DIAGNOSIS — E785 Hyperlipidemia, unspecified: Secondary | ICD-10-CM

## 2015-05-10 NOTE — Progress Notes (Signed)
Patient ID: [name], male    DOB: [birth year], 70 y.o.   MRN: [id number]  HPI Comments: 70 years old with CAD,  posterior MI in 2002 treated with a bare-metal stent to the circumflex artery,  back surgery by Dr. Patrice Paradise x2 In 2009 and 2010, and he had a negative Myoview scan at that time with an ejection fraction of 60% (2010).He had another preop evaluation in February 2011 prior to prostate biopsy and he ended up having a seed implantation. abnormal EKG noted on office visit in 2012,  He had a stress test that showed anterior wall ischemia. Cardiac catheterization was performed on January 05 2011 that showed 95% proximal LAD disease. A bare metal stent was placed ( 3.5 x 18 mm vision bare metal stent). He presents for follow up of his CAD.   In general he reports that he is doing well. He does have significant stress, daughter with cancer. She is only 54, has been fighting cancer since age 60.  Denies any shortness of breath or chest pain symptoms. He has severe arthritis in his hands, wrist Still with chronic back pain Lab work reviewed with him in detail showing total cholesterol 136, LDL 71 He reports blood pressures elevated on today's visit secondary to stress about meeting with oncology tomorrow (with his daughter)  EKG today shows sinus rhythm with rate 69 beats per minute with no significant ST or T-wave abnormality, consider old inferior MI  Prior medical hx he is s/p back surgery with profound anemia noted after the surgery. He was admitted to the hospital after routine blood work showed a HCT of 8. In the ER was noted to have hypotension. He had a transfusion, EGD, colonoscopy.   CT of the ABD did not show a bleed. He has received an iron infusion.   Previous CT scan showed abdominal aortic aneurysm 3.7 cm x 3.2 cm         Allergies  Allergen Reactions  . Lyrica [Pregabalin]     Swelling   . Prednisone     Feels like having a heart attack    Outpatient Encounter  Prescriptions as of 05/07/2014  Medication Sig  . albuterol (PROVENTIL HFA;VENTOLIN HFA) 108 (90 BASE) MCG/ACT inhaler Inhale 2 puffs into the lungs every 6 (six) hours as needed for wheezing.  [name] aspirin 81 MG tablet Take 81 mg by mouth daily.  [name] atorvastatin (LIPITOR) 40 MG tablet Take 1 tablet (40 mg total) by mouth daily at 6 PM.  . clopidogrel (PLAVIX) 75 MG tablet TAKE ONE (1) TABLET EACH DAY  . methocarbamol (ROBAXIN) 500 MG tablet Take 1 tablet (500 mg total) by mouth 2 (two) times daily.  [name] omeprazole (PRILOSEC) 40 MG capsule TAKE ONE (1) CAPSULE EACH DAY  . polyethylene glycol powder (GLYCOLAX/MIRALAX) powder Take 17 g by mouth as needed.   . tiotropium (SPIRIVA) 18 MCG inhalation capsule Place 18 mcg into inhaler and inhale daily.  . traMADol (ULTRAM) 50 MG tablet Take 1 tablet (50 mg total) by mouth 4 (four) times daily as needed.    Past Medical History  Diagnosis Date  . Coronary artery disease   . COPD (chronic obstructive pulmonary disease) ([place])   . Hypertension   . Hyperlipidemia   . History of kidney stones   . History of prostate cancer   . MI (myocardial infarction) ([place]) 03/2001  . Neuromuscular disorder [facility])     Past Surgical History  Procedure Laterality Date  . Prostate  surgery  09/2009    prostate implant  . Back surgery  2009/2010    x 2  . Hernia repair      bilateral   . Foot surgery      bilateral   . Knee surgery      right knee   . Cardiac catheterization  2002    stent placement [place]  . Spine surgery      Social History  reports that he has been smoking Cigarettes.  He has a 27.5 pack-year smoking history. He has never used smokeless tobacco. He reports that he does not drink alcohol or use illicit drugs.  Family History family history includes Diabetes in his mother; Heart attack in his mother; Heart disease in his father.  Review of Systems  Constitutional: Negative.   Respiratory: Negative.   Cardiovascular: Negative.    Gastrointestinal: Negative.   Musculoskeletal: Positive for arthralgias.  Skin: Negative.   Neurological: Negative.   Hematological: Negative.   Psychiatric/Behavioral: Negative.   All other systems reviewed and are negative.   BP 150/86 mmHg  Pulse 60  Ht 6' (1.829 m)  Wt 193 lb 8 oz (87.771 kg)  BMI 26.24 kg/m2  Physical Exam  Constitutional: He is oriented to person, place, and time. He appears well-developed and well-nourished.  HENT:  Head: Normocephalic.  Nose: Nose normal.  Mouth/Throat: Oropharynx is clear and moist.  Eyes: Conjunctivae are normal. Pupils are equal, round, and reactive to light.  Neck: Normal range of motion. Neck supple. No JVD present.  Cardiovascular: Normal rate, regular rhythm, S1 normal, S2 normal, normal heart sounds and intact distal pulses.  Exam reveals no gallop and no friction rub.   No murmur heard. Pulmonary/Chest: Effort normal. No respiratory distress. He has decreased breath sounds. He has no wheezes. He has no rales. He exhibits no tenderness.  Abdominal: Soft. Bowel sounds are normal. He exhibits no distension. There is no tenderness.  Musculoskeletal: Normal range of motion. He exhibits no edema or tenderness.  Lymphadenopathy:    He has no cervical adenopathy.  Neurological: He is alert and oriented to person, place, and time. Coordination normal.  Skin: Skin is warm and dry. No rash noted. No erythema.  Psychiatric: He has a normal mood and affect. His behavior is normal. Judgment and thought content normal.      Assessment and Plan   Nursing note and vitals reviewed.

## 2015-05-10 NOTE — Patient Instructions (Signed)
You are doing well. No medication changes were made.  Please call us if you have new issues that need to be addressed before your next appt.  Your physician wants you to follow-up in: 12 months.  You will receive a reminder letter in the mail two months in advance. If you don't receive a letter, please call our office to schedule the follow-up appointment. 

## 2015-05-10 NOTE — Assessment & Plan Note (Signed)
Blood pressure is well controlled on today's visit. No changes made to the medications. 

## 2015-05-10 NOTE — Assessment & Plan Note (Signed)
Cholesterol is at goal on the current lipid regimen. No changes to the medications were made.  

## 2015-05-10 NOTE — Assessment & Plan Note (Addendum)
COPD secondary to long smoking history Decreased airflow on clinical exam, followed by pulmonary and Dr. Derrel Nip

## 2015-05-10 NOTE — Assessment & Plan Note (Signed)
Currently with no symptoms of angina. No further workup at this time. Continue current medication regimen. 

## 2015-05-18 ENCOUNTER — Other Ambulatory Visit: Payer: Self-pay | Admitting: Internal Medicine

## 2015-05-18 DIAGNOSIS — E785 Hyperlipidemia, unspecified: Secondary | ICD-10-CM

## 2015-05-18 DIAGNOSIS — Z7289 Other problems related to lifestyle: Secondary | ICD-10-CM

## 2015-05-18 DIAGNOSIS — R5383 Other fatigue: Secondary | ICD-10-CM

## 2015-06-08 ENCOUNTER — Other Ambulatory Visit (INDEPENDENT_AMBULATORY_CARE_PROVIDER_SITE_OTHER): Payer: Medicare Other

## 2015-06-08 DIAGNOSIS — E785 Hyperlipidemia, unspecified: Secondary | ICD-10-CM

## 2015-06-08 DIAGNOSIS — R5383 Other fatigue: Secondary | ICD-10-CM | POA: Diagnosis not present

## 2015-06-08 DIAGNOSIS — Z7289 Other problems related to lifestyle: Secondary | ICD-10-CM

## 2015-06-08 LAB — LIPID PANEL
CHOL/HDL RATIO: 3
Cholesterol: 140 mg/dL (ref 0–200)
HDL: 41.5 mg/dL (ref >39.00)
LDL Cholesterol: 83 mg/dL (ref 0–99)
NONHDL: 98.67
Triglycerides: 79 mg/dL (ref 0.0–149.0)
VLDL: 15.8 mg/dL (ref 0.0–40.0)

## 2015-06-08 LAB — COMPREHENSIVE METABOLIC PANEL
ALT: 9 U/L (ref 0–53)
AST: 13 U/L (ref 0–37)
Albumin: 4 g/dL (ref 3.5–5.2)
Alkaline Phosphatase: 85 U/L (ref 39–117)
BUN: 15 mg/dL (ref 6–23)
CO2: 28 meq/L (ref 19–32)
Calcium: 9.2 mg/dL (ref 8.4–10.5)
Chloride: 107 mEq/L (ref 96–112)
Creatinine, Ser: 0.94 mg/dL (ref 0.40–1.50)
GFR: 83.95 mL/min (ref >60.00)
GLUCOSE: 94 mg/dL (ref 70–99)
POTASSIUM: 4 meq/L (ref 3.5–5.1)
Sodium: 142 mEq/L (ref 135–145)
Total Bilirubin: 1.2 mg/dL (ref 0.2–1.2)
Total Protein: 6.3 g/dL (ref 6.0–8.3)

## 2015-06-08 LAB — TSH: TSH: 2.21 u[IU]/mL (ref 0.35–4.50)

## 2015-06-08 LAB — CBC WITH DIFFERENTIAL/PLATELET
BASOS PCT: 0.3 % (ref 0.0–3.0)
Basophils Absolute: 0 10*3/uL (ref 0.0–0.1)
Eosinophils Absolute: 0.3 10*3/uL (ref 0.0–0.7)
Eosinophils Relative: 2.8 % (ref 0.0–5.0)
HEMATOCRIT: 44.3 % (ref 39.0–52.0)
Hemoglobin: 15.1 g/dL (ref 13.0–17.0)
LYMPHS PCT: 14.5 % (ref 12.0–46.0)
Lymphs Abs: 1.5 10*3/uL (ref 0.7–4.0)
MCHC: 34 g/dL (ref 30.0–36.0)
MCV: 90.2 fl (ref 78.0–100.0)
MONOS PCT: 5.9 % (ref 3.0–12.0)
Monocytes Absolute: 0.6 10*3/uL (ref 0.1–1.0)
NEUTROS ABS: 7.9 10*3/uL — AB (ref 1.4–7.7)
Neutrophils Relative %: 76.5 % (ref 43.0–77.0)
PLATELETS: 177 10*3/uL (ref 150.0–400.0)
RBC: 4.92 Mil/uL (ref 4.22–5.81)
RDW: 13.1 % (ref 11.5–15.5)
WBC: 10.3 10*3/uL (ref 4.0–10.5)

## 2015-06-09 LAB — HEPATITIS C ANTIBODY: HCV Ab: NEGATIVE

## 2015-06-10 DIAGNOSIS — H2513 Age-related nuclear cataract, bilateral: Secondary | ICD-10-CM | POA: Diagnosis not present

## 2015-06-11 ENCOUNTER — Encounter: Payer: Self-pay | Admitting: Internal Medicine

## 2015-06-21 DIAGNOSIS — N3281 Overactive bladder: Secondary | ICD-10-CM | POA: Diagnosis not present

## 2015-06-21 DIAGNOSIS — Z8546 Personal history of malignant neoplasm of prostate: Secondary | ICD-10-CM | POA: Diagnosis not present

## 2015-06-28 DIAGNOSIS — R3912 Poor urinary stream: Secondary | ICD-10-CM | POA: Diagnosis not present

## 2015-06-28 DIAGNOSIS — Z8546 Personal history of malignant neoplasm of prostate: Secondary | ICD-10-CM | POA: Diagnosis not present

## 2015-06-28 DIAGNOSIS — R351 Nocturia: Secondary | ICD-10-CM | POA: Diagnosis not present

## 2015-06-28 DIAGNOSIS — Z0000 Encounter for general adult medical examination without abnormal findings: Secondary | ICD-10-CM | POA: Diagnosis not present

## 2015-06-29 NOTE — Telephone Encounter (Signed)
Mailed unread message to patient.  

## 2015-07-06 ENCOUNTER — Other Ambulatory Visit: Payer: Self-pay | Admitting: Nurse Practitioner

## 2015-07-06 ENCOUNTER — Other Ambulatory Visit: Payer: Self-pay

## 2015-07-06 MED ORDER — ATORVASTATIN CALCIUM 40 MG PO TABS: 40.0000 mg | ORAL_TABLET | Freq: Every day | ORAL | Status: DC

## 2015-07-06 NOTE — Telephone Encounter (Signed)
Refill sent for Atorvastatin 

## 2015-07-07 ENCOUNTER — Other Ambulatory Visit: Payer: Self-pay

## 2015-07-13 ENCOUNTER — Ambulatory Visit: Payer: Medicare Other | Admitting: Pulmonary Disease

## 2015-07-16 ENCOUNTER — Encounter: Payer: Self-pay | Admitting: Pulmonary Disease

## 2015-07-16 ENCOUNTER — Ambulatory Visit (INDEPENDENT_AMBULATORY_CARE_PROVIDER_SITE_OTHER): Payer: Medicare Other | Admitting: Pulmonary Disease

## 2015-07-16 VITALS — BP 146/82 | HR 68 | Ht 73.0 in | Wt 196.0 lb

## 2015-07-16 DIAGNOSIS — Z716 Tobacco abuse counseling: Secondary | ICD-10-CM

## 2015-07-16 DIAGNOSIS — J40 Bronchitis, not specified as acute or chronic: Secondary | ICD-10-CM

## 2015-07-16 DIAGNOSIS — I25110 Atherosclerotic heart disease of native coronary artery with unstable angina pectoris: Secondary | ICD-10-CM

## 2015-07-16 DIAGNOSIS — R911 Solitary pulmonary nodule: Secondary | ICD-10-CM

## 2015-07-16 DIAGNOSIS — J449 Chronic obstructive pulmonary disease, unspecified: Secondary | ICD-10-CM

## 2015-07-16 MED ORDER — ALBUTEROL SULFATE HFA 108 (90 BASE) MCG/ACT IN AERS: 2.0000 | INHALATION_SPRAY | Freq: Four times a day (QID) | RESPIRATORY_TRACT | Status: DC | PRN

## 2015-07-16 NOTE — Assessment & Plan Note (Signed)
This has been a stable interval for him.  No exacerbations since the last visit.  He has moderate airflow obstruction but his sympotms are primarily cough which is due more to his tobacco use.  I explained to him that I think that Spiriva is not necessary as he will likely see symptoms improve with quitting somking.  Plan: Stop spiriva Use albuterol prn Stop smoking F/u 12 months

## 2015-07-16 NOTE — Assessment & Plan Note (Signed)
His nodule is stable on September 2016 chest imaging, this represents 3 years of stability total which is consistent with benign behavior.  Plan: Refer to lung cancer screening program on next visit

## 2015-07-16 NOTE — Patient Instructions (Signed)
Use albuterol on an as-needed basis. Stop smoking At this time I see no reason for you to take Spiriva I will see you back in one year or sooner if needed

## 2015-07-16 NOTE — Progress Notes (Signed)
Subjective:    Patient ID: [name], male    DOB: 1946/03/16, 70 y.o.   MRN: [id number]  Synopsis: [name] first saw the [place] [facility] in July 2013 for a pulmonary nodule. He has a greater than 50-pack-year smoking history and quit in 2012. He was found on his initial visit to have gold stage II COPD but has minimal symptoms. The nodule was 6 mm and located in the left lower lobe there is also a 6 mm pleural-based nodule seen in the right middle lobe.  10/09/2011 CT chest [facility]: 6 mm round smooth solid pulmonary nodule in the left lower lobe 01/10/2012 CT chest [facility]: 6 mm nodule in LLL stable, stable 5.8 cm pleural based lesion RML 06/25/2012 CT Chest [facility] > 7mm nodule in LLL unchanged, scarring in lingula likely retained mucus 01/20/2013 CT chest [facility]> Stable 62mm nodule int he LLL unchanged September 2016 CT chest stable 6 mm nodule  Smoked 1-1/2-2 packs a day for 40 years, quit December 2012 11/06/2011 simple spirometry with clear airflow obstruction, FEV1 to FVC ratio 60%, FEV1 2.37 L (63% predicted) 11/06/2011 [facility] 0  HPI  Chief Complaint  Patient presents with  . Follow-up    pt doing well, no complaints with breathing today.     [place] says he still has some breathing problems. He still has a a cough from somking a lot.  He doesn't produce much mucus.  He has a lot of sinus drainage this time of year. He doesn't take anything for sinus symptoms. Still smoking 1/2 ppd. He is not taking Spiriva due to the cost.   He can walk fine, not limited due to breathing. No trouble breathing over the winter.    Past Medical History  Diagnosis Date  . Coronary artery disease   . COPD (chronic obstructive pulmonary disease) ([place])   . Hypertension   . Hyperlipidemia   . History of kidney stones   . History of prostate cancer   . MI (myocardial infarction) ([place]) 03/2001  . Neuromuscular disorder ([place])      Review of Systems  Constitutional: Negative for fever,  chills and fatigue.  HENT: Negative for congestion, rhinorrhea, sinus pressure and sneezing.   Respiratory: Negative for cough, shortness of breath and wheezing.   Cardiovascular: Negative for chest pain, palpitations and leg swelling.      Objective:   Physical Exam  Filed Vitals:   07/16/15 1114  BP: 146/82  Pulse: 68  Height: 6\' 1"  (1.854 m)  Weight: 196 lb (88.905 kg)  SpO2: 95%   Gen: well appearing, no acute distress HEENT: NCAT, EOMi, OP clear,  PULM: CTA B CV: RRR, no mgr, no JVD AB: BS+, soft, nontender, no hsm Ext: warm, no edema, no clubbing, no cyanosis      Assessment & Plan:   COPD (chronic obstructive pulmonary disease) This has been a stable interval for him.  No exacerbations since the last visit.  He has moderate airflow obstruction but his sympotms are primarily cough which is due more to his tobacco use.  I explained to him that I think that Spiriva is not necessary as he will likely see symptoms improve with quitting somking.  Plan: Stop spiriva Use albuterol prn Stop smoking F/u 12 months  Tobacco abuse counseling He and his wife are both counseled to quit smoking today. I explained to them that if he continues to smoke he will become more symptomatic in the future. Smoking cessation literature was provided.  Pulmonary nodule His nodule is stable on September 2016 chest imaging, this represents 3 years of stability total which is consistent with benign behavior.  Plan: Refer to lung cancer screening program on next visit    Updated Medication List Outpatient Encounter Prescriptions as of 07/16/2015  Medication Sig  . albuterol (PROVENTIL HFA;VENTOLIN HFA) 108 (90 Base) MCG/ACT inhaler Inhale 2 puffs into the lungs every 6 (six) hours as needed for wheezing.  [name] aspirin 81 MG tablet Take 81 mg by mouth daily.  [name] atorvastatin (LIPITOR) 40 MG tablet Take 1 tablet (40 mg total) by mouth daily at 6 PM.  . guaiFENesin-codeine (ROBITUSSIN AC) 100-10  MG/5ML syrup Take 5 mLs by mouth 3 (three) times daily as needed for cough.  . methocarbamol (ROBAXIN) 500 MG tablet Take 1 tablet (500 mg total) by mouth 2 (two) times daily.  [name] omeprazole (PRILOSEC) 40 MG capsule Take 1 capsule (40 mg total) by mouth daily.  . polyethylene glycol powder (GLYCOLAX/MIRALAX) powder Take 17 g by mouth as needed.   [name] SPIRIVA HANDIHALER 18 MCG inhalation capsule INHALE ONE CAPSULE AS DIRECTED ONCE A DAY  . traMADol (ULTRAM) 50 MG tablet Take 1 tablet (50 mg total) by mouth 4 (four) times daily as needed.  . [DISCONTINUED] albuterol (PROVENTIL HFA;VENTOLIN HFA) 108 (90 BASE) MCG/ACT inhaler Inhale 2 puffs into the lungs every 6 (six) hours as needed for wheezing.   No facility-administered encounter medications on file as of 07/16/2015.

## 2015-07-16 NOTE — Assessment & Plan Note (Signed)
He and his wife are both counseled to quit smoking today. I explained to them that if he continues to smoke he will become more symptomatic in the future. Smoking cessation literature was provided.

## 2015-07-20 DIAGNOSIS — M4716 Other spondylosis with myelopathy, lumbar region: Secondary | ICD-10-CM | POA: Diagnosis not present

## 2015-07-22 ENCOUNTER — Other Ambulatory Visit: Payer: Self-pay

## 2015-07-22 MED ORDER — OMEPRAZOLE 40 MG PO CPDR: 40.0000 mg | DELAYED_RELEASE_CAPSULE | Freq: Every day | ORAL | Status: DC

## 2015-07-26 ENCOUNTER — Ambulatory Visit (INDEPENDENT_AMBULATORY_CARE_PROVIDER_SITE_OTHER): Payer: Medicare Other | Admitting: Internal Medicine

## 2015-07-26 VITALS — BP 138/80 | HR 74 | Temp 98.6°F | Resp 14 | Ht 72.0 in | Wt 196.2 lb

## 2015-07-26 DIAGNOSIS — I25110 Atherosclerotic heart disease of native coronary artery with unstable angina pectoris: Secondary | ICD-10-CM | POA: Diagnosis not present

## 2015-07-26 DIAGNOSIS — R05 Cough: Secondary | ICD-10-CM

## 2015-07-26 DIAGNOSIS — J441 Chronic obstructive pulmonary disease with (acute) exacerbation: Secondary | ICD-10-CM | POA: Diagnosis not present

## 2015-07-26 DIAGNOSIS — R059 Cough, unspecified: Secondary | ICD-10-CM

## 2015-07-26 MED ORDER — METHYLPREDNISOLONE ACETATE 40 MG/ML IJ SUSP
40.0000 mg | Freq: Once | INTRAMUSCULAR | Status: AC
  Administered 2015-07-26: 40 mg via INTRAMUSCULAR

## 2015-07-26 MED ORDER — DOXYCYCLINE HYCLATE 100 MG PO CAPS: 100.0000 mg | ORAL_CAPSULE | Freq: Two times a day (BID) | ORAL | Status: DC

## 2015-07-26 MED ORDER — HYDROCOD POLST-CPM POLST ER 10-8 MG/5ML PO SUER: 5.0000 mL | Freq: Every evening | ORAL | Status: DC | PRN

## 2015-07-26 NOTE — Progress Notes (Signed)
Subjective:  Patient ID: [name], male    DOB: October 31, 1945  Age: 70 y.o. MRN: [id number]  CC: The primary encounter diagnosis was Cough. A diagnosis of COPD exacerbation ([place]) was also pertinent to this visit.  HPI [name] presents for cough and shortess of breath.  symptoms started with runny nose  5 days ago, afer contact with sick grandchild.  Then developed cough, PND,  Sinus congestion.  No fevers or body aches, coughing at night .      Outpatient Prescriptions Prior to Visit  Medication Sig Dispense Refill  . albuterol (PROVENTIL HFA;VENTOLIN HFA) 108 (90 Base) MCG/ACT inhaler Inhale 2 puffs into the lungs every 6 (six) hours as needed for wheezing. 1 Inhaler 5  . aspirin 81 MG tablet Take 81 mg by mouth daily.    [name] atorvastatin (LIPITOR) 40 MG tablet Take 1 tablet (40 mg total) by mouth daily at 6 PM. 90 tablet 3  . methocarbamol (ROBAXIN) 500 MG tablet Take 1 tablet (500 mg total) by mouth 2 (two) times daily. 180 tablet 3  . omeprazole (PRILOSEC) 40 MG capsule Take 1 capsule (40 mg total) by mouth daily. 90 capsule 1  . polyethylene glycol powder (GLYCOLAX/MIRALAX) powder Take 17 g by mouth as needed.     [name] SPIRIVA HANDIHALER 18 MCG inhalation capsule INHALE ONE CAPSULE AS DIRECTED ONCE A DAY 30 capsule 1  . traMADol (ULTRAM) 50 MG tablet Take 1 tablet (50 mg total) by mouth 4 (four) times daily as needed. 120 tablet 3  . guaiFENesin-codeine (ROBITUSSIN AC) 100-10 MG/5ML syrup Take 5 mLs by mouth 3 (three) times daily as needed for cough. (Patient not taking: Reported on 07/26/2015) 180 mL 0   No facility-administered medications prior to visit.    Review of Systems;  Patient denies headache, fevers, malaise, unintentional weight loss, skin rash, eye pain, sinus congestion and sinus pain, sore throat, dysphagia,  hemoptysis , cough, dyspnea, wheezing, chest pain, palpitations, orthopnea, edema, abdominal pain, nausea, melena, diarrhea, constipation, flank pain,  dysuria, hematuria, urinary  Frequency, nocturia, numbness, tingling, seizures,  Focal weakness, Loss of consciousness,  Tremor, insomnia, depression, anxiety, and suicidal ideation.      Objective:  BP 138/80 mmHg  Pulse 74  Temp(Src) 98.6 F (37 C) (Oral)  Resp 14  Ht 6' (1.829 m)  Wt 196 lb 4 oz (89.018 kg)  BMI 26.61 kg/m2  SpO2 91%  BP Readings from Last 3 Encounters:  07/26/15 138/80  07/16/15 146/82  05/10/15 150/86    Wt Readings from Last 3 Encounters:  07/26/15 196 lb 4 oz (89.018 kg)  07/16/15 196 lb (88.905 kg)  05/10/15 193 lb 8 oz (87.771 kg)    General appearance: alert, cooperative and appears stated age Ears: normal TM's and external ear canals both ears Throat: lips, mucosa, and tongue normal; teeth and gums normal Neck: no adenopathy, no carotid bruit, supple, symmetrical, trachea midline and thyroid not enlarged, symmetric, no tenderness/mass/nodules Back: symmetric, no curvature. ROM normal. No CVA tenderness. Lungs: clear to auscultation bilaterally Heart: regular rate and rhythm, S1, S2 normal, no murmur, click, rub or gallop Abdomen: soft, non-tender; bowel sounds normal; no masses,  no organomegaly Pulses: 2+ and symmetric Skin: Skin color, texture, turgor normal. No rashes or lesions Lymph nodes: Cervical, supraclavicular, and axillary nodes normal.  No results found for: HGBA1C  Lab Results  Component Value Date   CREATININE 0.94 06/08/2015   CREATININE 0.89 11/17/2014   CREATININE 0.88 05/20/2014  Lab Results  Component Value Date   WBC 10.3 06/08/2015   HGB 15.1 06/08/2015   HCT 44.3 06/08/2015   PLT 177.0 06/08/2015   GLUCOSE 94 06/08/2015   CHOL 140 06/08/2015   TRIG 79.0 06/08/2015   HDL 41.50 06/08/2015   LDLCALC 83 06/08/2015   ALT 9 06/08/2015   AST 13 06/08/2015   NA 142 06/08/2015   K 4.0 06/08/2015   CL 107 06/08/2015   CREATININE 0.94 06/08/2015   BUN 15 06/08/2015   CO2 28 06/08/2015   TSH 2.21 06/08/2015    INR 1.06 01/03/2011    Ct Chest Wo Contrast  01/13/2015  CLINICAL DATA:  Followup pulmonary nodule. History of prostate cancer. Smoker. EXAM: CT CHEST WITHOUT CONTRAST TECHNIQUE: Multidetector CT imaging of the chest was performed following the standard protocol without IV contrast. COMPARISON:  06/25/2012 FINDINGS: Mediastinum: Heart size is normal. No pericardial effusion. Aortic atherosclerosis identified. There is calcification within the LAD, left circumflex coronary arteries. No mediastinal or hilar adenopathy identified. Lungs/Pleura: No pleural effusion. There is a scar like opacity within the lingular portion of the left lung, unchanged from previous exam. Pulmonary nodule in the left lower lobe measures 6 mm, image 45 of series 4. This is unchanged when compared with previous exam. No additional pulmonary nodules or masses. Upper Abdomen: The visualized portions of the liver are normal. The adrenal glands are unremarkable. Musculoskeletal: Spondylosis noted within the thoracic spine. No aggressive lytic or sclerotic bone lesions. IMPRESSION: 1. Stable left lower lobe pulmonary nodule measuring 6 mm. This is been unchanged since the study dated 01/10/2012 and is compatible with a benign process. No suspicious nodules are identified at this time and no further followup is indicated. This recommendation follows the consensus statement: Guidelines for Management of Small Pulmonary Nodules Detected on CT Scans: A Statement from the Fleischner Society as published in Radiology 2005; 237:395-400. 2. Aortic atherosclerosis and multi vessel coronary artery calcification. Electronically Signed   By: Kerby Moors M.D.   On: 01/13/2015 14:49    Assessment & Plan:   Problem List Items Addressed This Visit    COPD exacerbation ([place])    Current presentation suggest viral etiology. However he has COPD.  Will treat supportively with IM prednisone, antibiotics and cough suppressants .       Relevant  Medications   methylPREDNISolone acetate (DEPO-MEDROL) injection 40 mg (Completed)   chlorpheniramine-HYDROcodone (TUSSIONEX PENNKINETIC ER) 10-8 MG/5ML SUER    Other Visit Diagnoses    Cough    -  Primary    Relevant Medications    methylPREDNISolone acetate (DEPO-MEDROL) injection 40 mg (Completed)       I am having [name] start on doxycycline and chlorpheniramine-HYDROcodone. I am also having him maintain his methocarbamol, traMADol, polyethylene glycol powder, aspirin, guaiFENesin-codeine, SPIRIVA HANDIHALER, atorvastatin, albuterol, omeprazole, and rOPINIRole. We administered methylPREDNISolone acetate.  Meds ordered this encounter  Medications  . rOPINIRole (REQUIP) 0.5 MG tablet    Sig: Take 1 tablet by mouth daily.  . methylPREDNISolone acetate (DEPO-MEDROL) injection 40 mg    Sig:   . doxycycline (VIBRAMYCIN) 100 MG capsule    Sig: Take 1 capsule (100 mg total) by mouth 2 (two) times daily.    Dispense:  14 capsule    Refill:  0  . chlorpheniramine-HYDROcodone (TUSSIONEX PENNKINETIC ER) 10-8 MG/5ML SUER    Sig: Take 5 mLs by mouth at bedtime as needed for cough.    Dispense:  140 mL    Refill:  0    There are no discontinued medications.  Follow-up: No Follow-up on file.   Crecencio Mc, MD

## 2015-07-26 NOTE — Patient Instructions (Addendum)
You are also having a COPD exacerbation 1  If you become more short of breath than you are now,  You need to go to the nearest ER immediately or call 911  I am treating you  with the following:  Prednisone  IM dose   Doxycycline two times daily with food  7 days (antibiotic) Continue your spiriva daily .  Use your albuterol inhaler as needed Take Delsym for daytime cough, Tussionex for nighttime cough     Please take a probiotic ( Align, Floraque or Culturelle), or  the generic version of one of these  For a minimum of 3 weeks to prevent a serious antibiotic associated diarrhea  Called clostridium dificile colitis

## 2015-07-28 NOTE — Assessment & Plan Note (Signed)
Current presentation suggest viral etiology. However he has COPD.  Will treat supportively with IM prednisone, antibiotics and cough suppressants .

## 2015-12-28 ENCOUNTER — Encounter: Payer: Self-pay | Admitting: Internal Medicine

## 2015-12-28 ENCOUNTER — Ambulatory Visit (INDEPENDENT_AMBULATORY_CARE_PROVIDER_SITE_OTHER): Payer: Medicare Other | Admitting: Internal Medicine

## 2015-12-28 VITALS — BP 132/68 | Temp 98.2°F | Ht 70.0 in | Wt 196.4 lb

## 2015-12-28 DIAGNOSIS — Z23 Encounter for immunization: Secondary | ICD-10-CM

## 2015-12-28 DIAGNOSIS — Z0000 Encounter for general adult medical examination without abnormal findings: Secondary | ICD-10-CM

## 2015-12-28 DIAGNOSIS — E785 Hyperlipidemia, unspecified: Secondary | ICD-10-CM

## 2015-12-28 DIAGNOSIS — Z8546 Personal history of malignant neoplasm of prostate: Secondary | ICD-10-CM

## 2015-12-28 DIAGNOSIS — Z716 Tobacco abuse counseling: Secondary | ICD-10-CM

## 2015-12-28 DIAGNOSIS — I10 Essential (primary) hypertension: Secondary | ICD-10-CM

## 2015-12-28 DIAGNOSIS — K219 Gastro-esophageal reflux disease without esophagitis: Secondary | ICD-10-CM

## 2015-12-28 DIAGNOSIS — E559 Vitamin D deficiency, unspecified: Secondary | ICD-10-CM

## 2015-12-28 DIAGNOSIS — Z79899 Other long term (current) drug therapy: Secondary | ICD-10-CM

## 2015-12-28 DIAGNOSIS — M19049 Primary osteoarthritis, unspecified hand: Secondary | ICD-10-CM

## 2015-12-28 DIAGNOSIS — M1990 Unspecified osteoarthritis, unspecified site: Secondary | ICD-10-CM

## 2015-12-28 LAB — LIPID PANEL
CHOL/HDL RATIO: 4
CHOLESTEROL: 129 mg/dL (ref 0–200)
HDL: 35 mg/dL — AB (ref >39.00)
LDL Cholesterol: 71 mg/dL (ref 0–99)
NonHDL: 94.49
TRIGLYCERIDES: 115 mg/dL (ref 0.0–149.0)
VLDL: 23 mg/dL (ref 0.0–40.0)

## 2015-12-28 LAB — CBC WITH DIFFERENTIAL/PLATELET
BASOS ABS: 0 10*3/uL (ref 0.0–0.1)
Basophils Relative: 0.5 % (ref 0.0–3.0)
EOS PCT: 2.4 % (ref 0.0–5.0)
Eosinophils Absolute: 0.3 10*3/uL (ref 0.0–0.7)
HCT: 45.8 % (ref 39.0–52.0)
HEMOGLOBIN: 15.6 g/dL (ref 13.0–17.0)
Lymphocytes Relative: 15.8 % (ref 12.0–46.0)
Lymphs Abs: 1.7 10*3/uL (ref 0.7–4.0)
MCHC: 34.1 g/dL (ref 30.0–36.0)
MCV: 90.2 fl (ref 78.0–100.0)
MONO ABS: 0.7 10*3/uL (ref 0.1–1.0)
MONOS PCT: 6.5 % (ref 3.0–12.0)
NEUTROS PCT: 74.8 % (ref 43.0–77.0)
Neutro Abs: 8.2 10*3/uL — ABNORMAL HIGH (ref 1.4–7.7)
Platelets: 182 10*3/uL (ref 150.0–400.0)
RBC: 5.08 Mil/uL (ref 4.22–5.81)
RDW: 13.1 % (ref 11.5–15.5)
WBC: 10.9 10*3/uL — AB (ref 4.0–10.5)

## 2015-12-28 LAB — COMPREHENSIVE METABOLIC PANEL
ALBUMIN: 4 g/dL (ref 3.5–5.2)
ALT: 12 U/L (ref 0–53)
AST: 15 U/L (ref 0–37)
Alkaline Phosphatase: 98 U/L (ref 39–117)
BILIRUBIN TOTAL: 1 mg/dL (ref 0.2–1.2)
BUN: 13 mg/dL (ref 6–23)
CALCIUM: 9.1 mg/dL (ref 8.4–10.5)
CHLORIDE: 103 meq/L (ref 96–112)
CO2: 33 meq/L — AB (ref 19–32)
Creatinine, Ser: 0.95 mg/dL (ref 0.40–1.50)
GFR: 82.8 mL/min (ref >60.00)
Glucose, Bld: 104 mg/dL — ABNORMAL HIGH (ref 70–99)
Potassium: 4 mEq/L (ref 3.5–5.1)
Sodium: 140 mEq/L (ref 135–145)
Total Protein: 6.5 g/dL (ref 6.0–8.3)

## 2015-12-28 LAB — VITAMIN D 25 HYDROXY (VIT D DEFICIENCY, FRACTURES): VITD: 27.74 ng/mL — AB (ref 30.00–100.00)

## 2015-12-28 LAB — PSA, MEDICARE: PSA: 0.04 ng/ml — ABNORMAL LOW (ref 0.10–4.00)

## 2015-12-28 NOTE — Progress Notes (Signed)
Patient ID: [name], male    DOB: [birth year]  Age: 70 y.o. MRN: [id number]  The patient is here for annual Medicare wellness examination and management of other chronic and acute problems.   The risk factors are reflected in the social history. Bowels moving every othe d ay  Colonoscopy normal int he 2013 [facility]  EGD 2016 [facility]   Has a living will .  Has a healthcare [place] youngest son.  dtr is  37 and has CA metastatic started at age 51    The roster of all physicians providing medical care to patient - is listed in the Snapshot section of the chart.  Activities of daily living:  The patient is 100% independent in all ADLs: dressing, toileting, feeding as well as independent mobility  Home safety : The patient has smoke detectors in the home. They wear seatbelts.  There are no firearms at home. There is no violence in the home.   There is no risks for hepatitis, STDs or HIV. There is no   history of blood transfusion. They have no travel history to infectious disease endemic areas of the world.  The patient has seen their dentist in the last six month. They have seen their eye doctor in the last year.  Early cataracts, done at [facility].  .They admit to slight hearing difficulty with regard to whispered voices and some television programs.  They have deferred audiologic testing in the last year.  They do not  have excessive sun exposure. Discussed the need for sun protection: hats, long sleeves and use of sunscreen if there is significant sun exposure.   Diet: the importance of a healthy diet is discussed. They do have a healthy diet.  The benefits of regular aerobic exercise were discussed. He does not exervcise due to COPD and sciatica    Depression screen: there are no signs or vegative symptoms of depression- irritability, change in appetite, anhedonia, sadness/tearfullness.  Cognitive assessment: the patient manages all their financial and personal affairs  and is actively engaged. They could relate day,date,year and events; recalled 2/3 objects at 3 minutes; performed clock-face test normally.  The following portions of the patient's history were reviewed and updated as appropriate: allergies, current medications, past family history, past medical history,  past surgical history, past social history  and problem list.  Visual acuity was not assessed per patient preference since she has regular follow up with her ophthalmologist. Hearing and body mass index were assessed and reviewed.   During the course of the visit the patient was educated and counseled about appropriate screening and preventive services including : fall prevention , diabetes screening, nutrition counseling, colorectal cancer screening, and recommended immunizations.    CC: The primary encounter diagnosis was Vitamin D deficiency. Diagnoses of Encounter for immunization, Hyperlipidemia, Long-term use of high-risk medication, History of prostate cancer, Medicare annual wellness visit, subsequent, Gastroesophageal reflux disease without esophagitis, Essential hypertension, Tobacco abuse counseling, and Arthritis of hand were also pertinent to this visit.  Quit smoking in 2012,  Resumed smoking lthis year,  Nearly a pack daily , trying to quit , using mints ,  Quit again . Doesn't want help. Spent 3 minutes discussing risk of continued tobacco abuse, including but not limited to CAD, PAD, hypertension, and CA.  He is not interested in pharmacotherapy at this time.   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  Aggravated by sedentary lifestyle  And riding lawn mower.  Using robaxin and tramadol by Dr Glenna Fellows,  Cannot sleep supine in bed,  sleeping in recliner.   Uses 2 tramadol daily   .   Sees Dr Jeffie Pollock annually for history of prostate CA  Weight stable  Diet reviewed , he does most of the cooking.   No  complaints.  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   He has a past surgical history that includes ostate surgery (09/2009); Back surgery (2009/2010); Hernia repair; Foot surgery; Knee surgery; Cardiac catheterization (2002); and Spine surgery.   His family history includes Diabetes in his mother; Heart attack in his mother; Heart disease in his father.He reports that he has been smoking Cigarettes.  He has a 27.50 pack-year smoking history. He has never used smokeless tobacco. He reports that he does not drink alcohol or use drugs.  Outpatient Medications Prior to Visit  Medication Sig Dispense Refill  . albuterol (PROVENTIL HFA;VENTOLIN HFA) 108 (90 Base) MCG/ACT inhaler Inhale 2 puffs into the lungs every 6 (six) hours as needed for wheezing. 1 Inhaler 5  . aspirin 81 MG tablet Take 81 mg by mouth daily.    [name] atorvastatin (LIPITOR) 40 MG tablet Take 1 tablet (40 mg total) by mouth daily at 6 PM. 90 tablet 3  . chlorpheniramine-HYDROcodone (TUSSIONEX PENNKINETIC ER) 10-8 MG/5ML SUER Take 5 mLs by mouth at bedtime as needed for cough. 140 mL 0  . doxycycline (VIBRAMYCIN) 100 MG capsule Take 1 capsule (100 mg total) by mouth 2 (two) times daily. 14 capsule 0  . methocarbamol (ROBAXIN) 500 MG tablet Take 1 tablet (500 mg total) by mouth 2 (two) times daily. 180 tablet 3  . omeprazole (PRILOSEC) 40 MG capsule Take 1 capsule (40 mg total) by mouth daily. 90 capsule 1  . polyethylene glycol powder (GLYCOLAX/MIRALAX) powder Take 17 g by mouth as needed.     [name] rOPINIRole (REQUIP) 0.5 MG tablet Take 1 tablet by mouth daily.    [name]  SPIRIVA HANDIHALER 18 MCG inhalation capsule INHALE ONE CAPSULE AS DIRECTED ONCE A DAY 30 capsule 1  . traMADol (ULTRAM) 50 MG tablet Take 1 tablet (50 mg total) by mouth 4 (four) times daily as needed. 120 tablet 3  . guaiFENesin-codeine (ROBITUSSIN AC) 100-10 MG/5ML syrup Take 5 mLs by mouth 3 (three) times daily as needed for cough. 180 mL 0   No facility-administered medications prior to visit.     Review of Systems   Patient denies headache, fevers, malaise, unintentional weight loss, skin rash, eye pain, sinus congestion and sinus pain, sore throat, dysphagia,  hemoptysis , cough, dyspnea, wheezing, chest pain, palpitations, orthopnea, edema, abdominal pain, nausea, melena, diarrhea, constipation, flank pain, dysuria, hematuria, urinary  Frequency, nocturia, numbness, tingling, seizures,  Focal weakness, Loss of consciousness,  Tremor, insomnia, depression, anxiety, and suicidal ideation.      Objective:  BP 132/68   Temp 98.2 F (36.8 C) (Oral)   Ht 5\' 10"  (1.778 m)   Wt 196 lb 6 oz (89.1 kg)   SpO2 94%   BMI 28.18 kg/m   Physical Exam  General appearance: alert, cooperative and appears stated age Ears: normal TM's and external ear canals both ears Throat: lips, mucosa, and tongue normal; teeth and gums normal Neck: no adenopathy, no carotid bruit, supple, symmetrical, trachea midline and thyroid not enlarged, symmetric, no tenderness/mass/nodules Back: symmetric, no curvature. ROM normal. No CVA tenderness. Lungs: clear to auscultation bilaterally Heart: regular rate and rhythm, S1, S2 normal, no murmur, click, rub or gallop Abdomen: soft, non-tender; bowel sounds normal; no masses,  no organomegaly Pulses: 2+ and symmetric Skin: Skin color, texture, turgor normal. No rashes or lesions Lymph nodes: Cervical, supraclavicular, and axillary nodes normal.    Assessment & Plan:   Problem List Items Addressed This Visit    Essential hypertension    Well controlled on  current regimen. Renal function stable, no changes today.  Lab Results  Component Value Date   CREATININE 0.95 12/28/2015   Lab Results  Component Value Date   NA 140 12/28/2015   K 4.0 12/28/2015   CL 103 12/28/2015   CO2 33 (H) 12/28/2015         GERD    Well controlled on current regimen.       Tobacco abuse counseling    .Spent 3 minutes discussing risk of continued tobacco abuse, including but not limited to CAD, PAD, hypertension, and CA.  He is not interested in pharmacotherapy at this time.      Arthritis of hand    Bilateral thumb joints.  Avoiding surgery, per Gramig with use of braces.       Medicare annual wellness visit, subsequent    Annual Medicare wellness  exam was done as well as a comprehensive physical exam and management of acute and chronic conditions .  During the course of the visit the patient was educated and counseled about appropriate screening and preventive services including : fall prevention , diabetes screening, nutrition counseling, colorectal cancer screening, and recommended immunizations.  Printed recommendations for health maintenance screenings was given.       Hyperlipidemia   Relevant Orders   Lipid panel (Completed)    Other Visit Diagnoses    Vitamin D deficiency    -  Primary   Relevant Orders   VITAMIN D 25 Hydroxy (Vit-D Deficiency, Fractures) (Completed)   Encounter for immunization       Relevant Orders   Flu Vaccine QUAD 36+ mos IM (Completed)   Long-term use of high-risk medication       Relevant Orders   Comprehensive metabolic panel (Completed)   CBC with Differential/Platelet (Completed)   History of prostate cancer       Relevant Orders   PSA, Medicare (Completed)      I have discontinued Mr. Credeur's guaiFENesin-codeine. I am also having him maintain his methocarbamol, traMADol, polyethylene glycol powder, aspirin, SPIRIVA HANDIHALER, atorvastatin, albuterol, omeprazole, rOPINIRole, doxycycline, and  chlorpheniramine-HYDROcodone.  No orders of the defined types were placed in this encounter.   Medications Discontinued During This Encounter  Medication Reason  . guaiFENesin-codeine (ROBITUSSIN AC) 100-10 MG/5ML syrup Completed Course    Follow-up: No Follow-up on file.   Crecencio Mc, MD

## 2015-12-28 NOTE — Patient Instructions (Addendum)
Try taking 2 tramadol at bedtime with robaxin  To help you rest better   Please cut back on smoking  Gradually! Health Maintenance, Male A healthy lifestyle and preventative care can promote health and wellness.  Maintain regular health, dental, and eye exams.  Eat a healthy diet. Foods like vegetables, fruits, whole grains, low-fat dairy products, and lean protein foods contain the nutrients you need and are low in calories. Decrease your intake of foods high in solid fats, added sugars, and salt. Get information about a proper diet from your health care provider, if necessary.  Regular physical exercise is one of the most important things you can do for your health. Most adults should get at least 150 minutes of moderate-intensity exercise (any activity that increases your heart rate and causes you to sweat) each week. In addition, most adults need muscle-strengthening exercises on 2 or more days a week.   Maintain a healthy weight. The body mass index (BMI) is a screening tool to identify possible weight problems. It provides an estimate of body fat based on height and weight. Your health care provider can find your BMI and can help you achieve or maintain a healthy weight. For males 20 years and older:  A BMI below 18.5 is considered underweight.  A BMI of 18.5 to 24.9 is normal.  A BMI of 25 to 29.9 is considered overweight.  A BMI of 30 and above is considered obese.  Maintain normal blood lipids and cholesterol by exercising and minimizing your intake of saturated fat. Eat a balanced diet with plenty of fruits and vegetables. Blood tests for lipids and cholesterol should begin at age 41 and be repeated every 5 years. If your lipid or cholesterol levels are high, you are over age 81, or you are at high risk for heart disease, you may need your cholesterol levels checked more frequently.Ongoing high lipid and cholesterol levels should be treated with medicines if diet and exercise are  not working.  If you smoke, find out from your health care provider how to quit. If you do not use tobacco, do not start.  Lung cancer screening is recommended for adults aged 76-80 years who are at high risk for developing lung cancer because of a history of smoking. A yearly low-dose CT scan of the lungs is recommended for people who have at least a 30-pack-year history of smoking and are current smokers or have quit within the past 15 years. A pack year of smoking is smoking an average of 1 pack of cigarettes a day for 1 year (for example, a 30-pack-year history of smoking could mean smoking 1 pack a day for 30 years or 2 packs a day for 15 years). Yearly screening should continue until the smoker has stopped smoking for at least 15 years. Yearly screening should be stopped for people who develop a health problem that would prevent them from having lung cancer treatment.  If you choose to drink alcohol, do not have more than 2 drinks per day. One drink is considered to be 12 oz (360 mL) of beer, 5 oz (150 mL) of wine, or 1.5 oz (45 mL) of liquor.  Avoid the use of street drugs. Do not share needles with anyone. Ask for help if you need support or instructions about stopping the use of drugs.  High blood pressure causes heart disease and increases the risk of stroke. High blood pressure is more likely to develop in:  People who have blood pressure in  the end of the normal range (100-139/85-89 mm Hg).  People who are overweight or obese.  People who are African American.  If you are 61-92 years of age, have your blood pressure checked every 3-5 years. If you are 102 years of age or older, have your blood pressure checked every year. You should have your blood pressure measured twice--once when you are at a hospital or clinic, and once when you are not at a hospital or clinic. Record the average of the two measurements. To check your blood pressure when you are not at a hospital or clinic, you can  use:  An automated blood pressure machine at a pharmacy.  A home blood pressure monitor.  If you are 81-32 years old, ask your health care provider if you should take aspirin to prevent heart disease.  Diabetes screening involves taking a blood sample to check your fasting blood sugar level. This should be done once every 3 years after age 66 if you are at a normal weight and without risk factors for diabetes. Testing should be considered at a younger age or be carried out more frequently if you are overweight and have at least 1 risk factor for diabetes.  Colorectal cancer can be detected and often prevented. Most routine colorectal cancer screening begins at the age of 17 and continues through age 51. However, your health care provider may recommend screening at an earlier age if you have risk factors for colon cancer. On a yearly basis, your health care provider may provide home test kits to check for hidden blood in the stool. A small camera at the end of a tube may be used to directly examine the colon (sigmoidoscopy or colonoscopy) to detect the earliest forms of colorectal cancer. Talk to your health care provider about this at age 61 when routine screening begins. A direct exam of the colon should be repeated every 5-10 years through age 43, unless early forms of precancerous polyps or small growths are found.  People who are at an increased risk for hepatitis B should be screened for this virus. You are considered at high risk for hepatitis B if:  You were born in a country where hepatitis B occurs often. Talk with your health care provider about which countries are considered high risk.  Your parents were born in a high-risk country and you have not received a shot to protect against hepatitis B (hepatitis B vaccine).  You have HIV or AIDS.  You use needles to inject street drugs.  You live with, or have sex with, someone who has hepatitis B.  You are a man who has sex with other  men (MSM).  You get hemodialysis treatment.  You take certain medicines for conditions like cancer, organ transplantation, and autoimmune conditions.  Hepatitis C blood testing is recommended for all people born from [birth year] through 1967 and any individual with known risk factors for hepatitis C.  Healthy men should no longer receive prostate-specific antigen (PSA) blood tests as part of routine cancer screening. Talk to your health care provider about prostate cancer screening.  Testicular cancer screening is not recommended for adolescents or adult males who have no symptoms. Screening includes self-exam, a health care provider exam, and other screening tests. Consult with your health care provider about any symptoms you have or any concerns you have about testicular cancer.  Practice safe sex. Use condoms and avoid high-risk sexual practices to reduce the spread of sexually transmitted infections (STIs).  You  should be screened for STIs, including gonorrhea and chlamydia if:  You are sexually active and are younger than 24 years.  You are older than 24 years, and your health care provider tells you that you are at risk for this type of infection.  Your sexual activity has changed since you were last screened, and you are at an increased risk for chlamydia or gonorrhea. Ask your health care provider if you are at risk.  If you are at risk of being infected with HIV, it is recommended that you take a prescription medicine daily to prevent HIV infection. This is called pre-exposure prophylaxis (PrEP). You are considered at risk if:  You are a man who has sex with other men (MSM).  You are a heterosexual man who is sexually active with multiple partners.  You take drugs by injection.  You are sexually active with a partner who has HIV.  Talk with your health care provider about whether you are at high risk of being infected with HIV. If you choose to begin PrEP, you should first be tested  for HIV. You should then be tested every 3 months for as long as you are taking PrEP.  Use sunscreen. Apply sunscreen liberally and repeatedly throughout the day. You should seek shade when your shadow is shorter than you. Protect yourself by wearing long sleeves, pants, a wide-brimmed hat, and sunglasses year round whenever you are outdoors.  Tell your health care provider of new moles or changes in moles, especially if there is a change in shape or color. Also, tell your health care provider if a mole is larger than the size of a pencil eraser.  A one-time screening for abdominal aortic aneurysm (AAA) and surgical repair of large AAAs by ultrasound is recommended for men aged 49-75 years who are current or former smokers.  Stay current with your vaccines (immunizations).   This information is not intended to replace advice given to you by your health care provider. Make sure you discuss any questions you have with your health care provider.   Document Released: 09/30/2007 Document Revised: 04/24/2014 Document Reviewed: 08/29/2010 Elsevier Interactive Patient Education [employer].

## 2015-12-28 NOTE — Progress Notes (Signed)
Pre visit review using our clinic review tool, if applicable. No additional management support is needed unless otherwise documented below in the visit note. 

## 2015-12-29 NOTE — Assessment & Plan Note (Signed)
Spent 3 minutes discussing risk of continued tobacco abuse, including but not limited to CAD, PAD, hypertension, and CA.  He is not interested in pharmacotherapy at this time. 

## 2015-12-29 NOTE — Assessment & Plan Note (Signed)
Bilateral thumb joints.  Avoiding surgery, per Gramig with use of braces.

## 2015-12-29 NOTE — Assessment & Plan Note (Signed)

## 2015-12-29 NOTE — Assessment & Plan Note (Signed)
Well controlled on current regimen. Renal function stable, no changes today.  Lab Results  Component Value Date   CREATININE 0.95 12/28/2015   Lab Results  Component Value Date   NA 140 12/28/2015   K 4.0 12/28/2015   CL 103 12/28/2015   CO2 33 (H) 12/28/2015

## 2015-12-29 NOTE — Assessment & Plan Note (Addendum)
Well-controlled on current regimen. ?

## 2015-12-30 ENCOUNTER — Encounter: Payer: Self-pay | Admitting: Internal Medicine

## 2016-01-13 NOTE — Telephone Encounter (Signed)
Mailed unread message to patient.  

## 2016-02-02 ENCOUNTER — Ambulatory Visit (INDEPENDENT_AMBULATORY_CARE_PROVIDER_SITE_OTHER): Payer: Medicare Other | Admitting: Family Medicine

## 2016-02-02 ENCOUNTER — Encounter: Payer: Self-pay | Admitting: Family Medicine

## 2016-02-02 VITALS — BP 126/64 | HR 80 | Temp 97.9°F | Wt 197.0 lb

## 2016-02-02 DIAGNOSIS — I25110 Atherosclerotic heart disease of native coronary artery with unstable angina pectoris: Secondary | ICD-10-CM | POA: Diagnosis not present

## 2016-02-02 DIAGNOSIS — J441 Chronic obstructive pulmonary disease with (acute) exacerbation: Secondary | ICD-10-CM | POA: Diagnosis not present

## 2016-02-02 MED ORDER — DOXYCYCLINE HYCLATE 100 MG PO CAPS: 100.0000 mg | ORAL_CAPSULE | Freq: Two times a day (BID) | ORAL | 0 refills | Status: DC

## 2016-02-02 MED ORDER — TIOTROPIUM BROMIDE MONOHYDRATE 2.5 MCG/ACT IN AERS: INHALATION_SPRAY | RESPIRATORY_TRACT | 0 refills | Status: DC

## 2016-02-02 MED ORDER — METHYLPREDNISOLONE ACETATE 40 MG/ML IJ SUSP
40.0000 mg | Freq: Once | INTRAMUSCULAR | Status: AC
  Administered 2016-02-02: 40 mg via INTRAMUSCULAR

## 2016-02-02 NOTE — Assessment & Plan Note (Signed)
This is likely secondary to viral illness. Exam consistent with mild COPD exacerbation. Treating with doxycycline. Depo-Medrol given today as patient cannot tolerate oral steroids. Sample for Spiriva given today.

## 2016-02-02 NOTE — Progress Notes (Signed)
Pre visit review using our clinic review tool, if applicable. No additional management support is needed unless otherwise documented below in the visit note. 

## 2016-02-02 NOTE — Patient Instructions (Signed)
Take the antibiotic as prescribed.  Use your inhalers.  Take care  Dr. Lacinda Axon

## 2016-02-02 NOTE — Progress Notes (Signed)
Subjective:  Patient ID: [name], male    DOB: [birth year]  Age: 70 y.o. MRN: [id number]  CC: Cough  HPI:  70 year old male with a history of CAD and COPD presents with complaints of cough.  Patient states that he's had a one-week history of cough. Cough is productive of discolored sputum. No associated fever or shortness of breath. He endorses that he is noncompliant with his inhalers as he cannot afford them. His wife is sick with similar symptoms as well. No known exacerbating or relieving factors. No other complaint at this time.  Social Hx   Social History   Social History  . Marital status: Married    Spouse name: N/A  . Number of children: N/A  . Years of education: N/A   Social History Main Topics  . Smoking status: Current Every Day Smoker    Packs/day: 0.50    Years: 55.00    Types: Cigarettes  . Smokeless tobacco: Never Used     Comment: back to smoking X1 year  . Alcohol use No  . Drug use: No  . Sexual activity: Not Asked   Other Topics Concern  . None   Social History Narrative  . None    Review of Systems  Constitutional: Negative for fever.  Respiratory: Positive for cough.    Objective:  BP 126/64 (BP Location: Left Arm, Patient Position: Sitting, Cuff Size: Normal)   Pulse 80   Temp 97.9 F (36.6 C) (Oral)   Wt 197 lb (89.4 kg)   SpO2 95%   BMI 28.27 kg/m   BP/Weight 02/02/2016 12/28/2015 [id number]  Systolic BP [id number] [id number]  Diastolic BP 64 68 80  Wt. (Lbs) 197 196.38 196.25  BMI 28.27 28.18 26.61   Physical Exam  Constitutional: He is oriented to person, place, and time.  Chronically ill-appearing male in no acute distress.  HENT:  Mouth/Throat: Oropharynx is clear and moist.  Cardiovascular: Normal rate and regular rhythm.   Pulmonary/Chest: Effort normal.  Mild scattered next or wheezing.  Neurological: He is alert and oriented to person, place, and time.  Psychiatric: He has a normal mood and affect.  Vitals  reviewed.  Lab Results  Component Value Date   WBC 10.9 (H) 12/28/2015   HGB 15.6 12/28/2015   HCT 45.8 12/28/2015   PLT 182.0 12/28/2015   GLUCOSE 104 (H) 12/28/2015   CHOL 129 12/28/2015   TRIG 115.0 12/28/2015   HDL 35.00 (L) 12/28/2015   LDLCALC 71 12/28/2015   ALT 12 12/28/2015   AST 15 12/28/2015   NA 140 12/28/2015   K 4.0 12/28/2015   CL 103 12/28/2015   CREATININE 0.95 12/28/2015   BUN 13 12/28/2015   CO2 33 (H) 12/28/2015   TSH 2.21 06/08/2015   PSA 0.04 (L) 12/28/2015   INR 1.06 01/03/2011    Assessment & Plan:   Problem List Items Addressed This Visit    COPD exacerbation ([place]) - Primary    This is likely secondary to viral illness. Exam consistent with mild COPD exacerbation. Treating with doxycycline. Depo-Medrol given today as patient cannot tolerate oral steroids. Sample for Spiriva given today.       Relevant Medications   Tiotropium Bromide Monohydrate (SPIRIVA RESPIMAT) 2.5 MCG/ACT AERS   methylPREDNISolone acetate (DEPO-MEDROL) injection 40 mg (Completed)    Other Visit Diagnoses   None.     Meds ordered this encounter  Medications  . doxycycline (VIBRAMYCIN) 100 MG capsule    Sig: Take 1  capsule (100 mg total) by mouth 2 (two) times daily.    Dispense:  20 capsule    Refill:  0  . Tiotropium Bromide Monohydrate (SPIRIVA RESPIMAT) 2.5 MCG/ACT AERS    Sig: 2 inhalations daily.    Dispense:  4 g    Refill:  0  . methylPREDNISolone acetate (DEPO-MEDROL) injection 40 mg    Follow-up: PRN  [place]

## 2016-02-10 ENCOUNTER — Other Ambulatory Visit: Payer: Self-pay

## 2016-02-10 MED ORDER — OMEPRAZOLE 40 MG PO CPDR: 40.0000 mg | DELAYED_RELEASE_CAPSULE | Freq: Every day | ORAL | 1 refills | Status: DC

## 2016-03-24 ENCOUNTER — Other Ambulatory Visit: Payer: Self-pay

## 2016-05-09 ENCOUNTER — Ambulatory Visit (INDEPENDENT_AMBULATORY_CARE_PROVIDER_SITE_OTHER): Payer: Medicare Other

## 2016-05-09 ENCOUNTER — Ambulatory Visit (INDEPENDENT_AMBULATORY_CARE_PROVIDER_SITE_OTHER): Payer: Medicare Other | Admitting: Family

## 2016-05-09 ENCOUNTER — Encounter: Payer: Self-pay | Admitting: Family

## 2016-05-09 VITALS — BP 150/60 | HR 66 | Temp 98.2°F | Ht 70.0 in | Wt 203.6 lb

## 2016-05-09 DIAGNOSIS — J441 Chronic obstructive pulmonary disease with (acute) exacerbation: Secondary | ICD-10-CM

## 2016-05-09 DIAGNOSIS — M4716 Other spondylosis with myelopathy, lumbar region: Secondary | ICD-10-CM | POA: Diagnosis not present

## 2016-05-09 MED ORDER — TIOTROPIUM BROMIDE MONOHYDRATE 18 MCG IN CAPS: ORAL_CAPSULE | RESPIRATORY_TRACT | 1 refills | Status: DC

## 2016-05-09 MED ORDER — IPRATROPIUM-ALBUTEROL 0.5-2.5 (3) MG/3ML IN SOLN: 3.0000 mL | Freq: Four times a day (QID) | RESPIRATORY_TRACT | Status: DC

## 2016-05-09 MED ORDER — HYDROCOD POLST-CPM POLST ER 10-8 MG/5ML PO SUER: 5.0000 mL | Freq: Every evening | ORAL | 0 refills | Status: DC | PRN

## 2016-05-09 MED ORDER — ALBUTEROL SULFATE HFA 108 (90 BASE) MCG/ACT IN AERS: 2.0000 | INHALATION_SPRAY | Freq: Four times a day (QID) | RESPIRATORY_TRACT | 5 refills | Status: DC | PRN

## 2016-05-09 MED ORDER — METHYLPREDNISOLONE ACETATE 40 MG/ML IJ SUSP
40.0000 mg | Freq: Once | INTRAMUSCULAR | Status: AC
  Administered 2016-05-09: 40 mg via INTRAMUSCULAR

## 2016-05-09 NOTE — Assessment & Plan Note (Addendum)
sao2 93%. Breathing unlabored. Responded well to nebulizer. Pending CXR to ensure no underlying infection. Depo Medrol IM given as patient preference. Education provided on maintenance v rescue inhalers.Return precautions given.

## 2016-05-09 NOTE — Progress Notes (Signed)
Pre visit review using our clinic review tool, if applicable. No additional management support is needed unless otherwise documented below in the visit note. 

## 2016-05-09 NOTE — Patient Instructions (Addendum)
CXR  Albuterol inhaler is your RESCUE inhaler  Use albuterol every 6 hours for first 24 hours to get good medication into the lungs and loosen congestion; after, you may use as needed and eventually stop all together when cough resolves.  Spiriva is your everyday inhaler for MAINTENANCE.   If there is no improvement in your symptoms, or if there is any worsening of symptoms, or if you have any additional concerns, please return for re-evaluation; or, if we are closed, consider going to the Emergency Room for evaluation if symptoms urgent.    Chronic Obstructive Pulmonary Disease Exacerbation Chronic obstructive pulmonary disease (COPD) is a common lung condition in which airflow from the lungs is limited. COPD is a general term that can be used to describe many different lung problems that limit airflow, including chronic bronchitis and emphysema. COPD exacerbations are episodes when breathing symptoms become much worse and require extra treatment. Without treatment, COPD exacerbations can be life threatening, and frequent COPD exacerbations can cause further damage to your lungs. What are the causes?  Respiratory infections.  Exposure to smoke.  Exposure to air pollution, chemical fumes, or dust. Sometimes there is no apparent cause or trigger. What increases the risk?  Smoking cigarettes.  Older age.  Frequent prior COPD exacerbations. What are the signs or symptoms?  Increased coughing.  Increased thick spit (sputum) production.  Increased wheezing.  Increased shortness of breath.  Rapid breathing.  Chest tightness. How is this diagnosed? Your medical history, a physical exam, and tests will help your health care provider make a diagnosis. Tests may include:  A chest X-ray.  Basic lab tests.  Sputum testing.  An arterial blood gas test. How is this treated? Depending on the severity of your COPD exacerbation, you may need to be admitted to a hospital for  treatment. Some of the treatments commonly used to treat COPD exacerbations are:  Antibiotic medicines.  Bronchodilators. These are drugs that expand the air passages. They may be given with an inhaler or nebulizer. Spacer devices may be needed to help improve drug delivery.  Corticosteroid medicines.  Supplemental oxygen therapy.  Airway clearing techniques, such as noninvasive ventilation (NIV) and positive expiratory pressure (PEP). These provide respiratory support through a mask or other noninvasive device. Follow these instructions at home:  Do not smoke. Quitting smoking is very important to prevent COPD from getting worse and exacerbations from happening as often.  Avoid exposure to all substances that irritate the airway, especially to tobacco smoke.  If you were prescribed an antibiotic medicine, finish it all even if you start to feel better.  Take all medicines as directed by your health care provider.It is important to use correct technique with inhaled medicines.  Drink enough fluids to keep your urine clear or pale yellow (unless you have a medical condition that requires fluid restriction).  Use a cool mist vaporizer. This makes it easier to clear your chest when you cough.  If you have a home nebulizer and oxygen, continue to use them as directed.  Maintain all necessary vaccinations to prevent infections.  Exercise regularly.  Eat a healthy diet.  Keep all follow-up appointments as directed by your health care provider. Get help right away if:  You have worsening shortness of breath.  You have trouble talking.  You have severe chest pain.  You have blood in your sputum.  You have a fever.  You have weakness, vomit repeatedly, or faint.  You feel confused.  You continue to  get worse. This information is not intended to replace advice given to you by your health care provider. Make sure you discuss any questions you have with your health care  provider. Document Released: 01/29/2007 Document Revised: 09/09/2015 Document Reviewed: 12/06/2012 Elsevier Interactive Patient Education  2017 [employer].

## 2016-05-09 NOTE — Progress Notes (Signed)
Subjective:    Patient ID: [name], male    DOB: May 10, 1945, 71 y.o.   MRN: [id number]  CC: [name] is a 71 y.o. male who presents today for an acute visit.    HPI: CC: Cough x 2 weeks, worsening. Productive. Endorses nasal congestion, wheezing. No sinus pressure, ear pain.   Tessalon perles with no relief. Has been using wife's symbicort and not spirva. Run out of albuterol and spirva.  H/o copd Smoker  See Dr Lucita Lora next week     HISTORY:  Past Medical History:  Diagnosis Date  . COPD (chronic obstructive pulmonary disease) ([place])   . Coronary artery disease   . History of kidney stones   . History of prostate cancer   . Hyperlipidemia   . Hypertension   . MI (myocardial infarction) 03/2001  . Neuromuscular disorder [facility])    Past Surgical History:  Procedure Laterality Date  . BACK SURGERY  2009/2010   x 2  . CARDIAC CATHETERIZATION  2002   stent placement [place]  . FOOT SURGERY     bilateral   . HERNIA REPAIR     bilateral   . KNEE SURGERY     right knee   . PROSTATE SURGERY  09/2009   prostate implant  . SPINE SURGERY     Family History  Problem Relation Age of Onset  . Heart attack Mother   . Diabetes Mother   . Heart disease Father     Allergies: Lyrica [pregabalin] and Prednisone Current Outpatient Prescriptions on File Prior to Visit  Medication Sig Dispense Refill  . aspirin 81 MG tablet Take 81 mg by mouth daily.    [name] atorvastatin (LIPITOR) 40 MG tablet Take 1 tablet (40 mg total) by mouth daily at 6 PM. 90 tablet 3  . doxycycline (VIBRAMYCIN) 100 MG capsule Take 1 capsule (100 mg total) by mouth 2 (two) times daily. 20 capsule 0  . methocarbamol (ROBAXIN) 500 MG tablet Take 1 tablet (500 mg total) by mouth 2 (two) times daily. 180 tablet 3  . omeprazole (PRILOSEC) 40 MG capsule Take 1 capsule (40 mg total) by mouth daily. 90 capsule 1  . polyethylene glycol powder (GLYCOLAX/MIRALAX) powder Take 17 g by mouth as needed.     [name]  rOPINIRole (REQUIP) 0.5 MG tablet Take 1 tablet by mouth daily.    . traMADol (ULTRAM) 50 MG tablet Take 1 tablet (50 mg total) by mouth 4 (four) times daily as needed. 120 tablet 3   No current facility-administered medications on file prior to visit.     Social History  Substance Use Topics  . Smoking status: Current Every Day Smoker    Packs/day: 0.50    Years: 55.00    Types: Cigarettes  . Smokeless tobacco: Never Used     Comment: back to smoking X1 year  . Alcohol use No    Review of Systems  Constitutional: Negative for chills and fever.  HENT: Positive for congestion. Negative for ear pain, sinus pain, sinus pressure and sore throat.   Respiratory: Positive for cough and wheezing. Negative for shortness of breath.   Cardiovascular: Negative for chest pain, palpitations and leg swelling.  Gastrointestinal: Negative for nausea and vomiting.      Objective:    BP (!) 150/60   Pulse 66   Temp 98.2 F (36.8 C) (Oral)   Ht 5\' 10"  (1.778 m)   Wt 203 lb 9.6 oz (92.4 kg)  SpO2 93%   BMI 29.21 kg/m    Physical Exam  Constitutional: Vital signs are normal. He appears well-developed and well-nourished.  HENT:  Head: Normocephalic and atraumatic.  Right Ear: Hearing, tympanic membrane, external ear and ear canal normal. No drainage, swelling or tenderness. Tympanic membrane is not injected, not erythematous and not bulging. No middle ear effusion. No decreased hearing is noted.  Left Ear: Hearing, tympanic membrane, external ear and ear canal normal. No drainage, swelling or tenderness. Tympanic membrane is not injected, not erythematous and not bulging.  No middle ear effusion. No decreased hearing is noted.  Nose: Nose normal. Right sinus exhibits no maxillary sinus tenderness and no frontal sinus tenderness. Left sinus exhibits no maxillary sinus tenderness and no frontal sinus tenderness.  Mouth/Throat: Uvula is midline, oropharynx is clear and moist and mucous membranes  are normal. No oropharyngeal exudate, posterior oropharyngeal edema, posterior oropharyngeal erythema or tonsillar abscesses.  Eyes: Conjunctivae are normal.  Cardiovascular: Regular rhythm and normal heart sounds.   Pulmonary/Chest: Effort normal. No respiratory distress. He has decreased breath sounds in the right lower field and the left lower field. He has no wheezes. He has no rhonchi. He has no rales.  Lymphadenopathy:       Head (right side): No submental, no submandibular, no tonsillar, no preauricular, no posterior auricular and no occipital adenopathy present.       Head (left side): No submental, no submandibular, no tonsillar, no preauricular, no posterior auricular and no occipital adenopathy present.    He has no cervical adenopathy.  Neurological: He is alert.  Skin: Skin is warm and dry.  Psychiatric: He has a normal mood and affect. His speech is normal and behavior is normal.  Vitals reviewed.  Patient felt significantly better after albuterol treatment. Lung sounds clear and increased     Assessment & Plan:   Problem List Items Addressed This Visit      Respiratory   COPD exacerbation ([place]) - Primary    sao2 93%. Breathing unlabored. Responded well to nebulizer. Pending CXR to ensure no underlying infection. Depo Medrol IM given as patient preference. Education provided on maintenance v rescue inhalers.Return precautions given.       Relevant Medications   albuterol (PROVENTIL HFA;VENTOLIN HFA) 108 (90 Base) MCG/ACT inhaler   chlorpheniramine-HYDROcodone (TUSSIONEX PENNKINETIC ER) 10-8 MG/5ML SUER   ipratropium-albuterol (DUONEB) 0.5-2.5 (3) MG/3ML nebulizer solution 3 mL (Start on 05/09/2016  2:00 PM)   methylPREDNISolone acetate (DEPO-MEDROL) injection 40 mg (Start on 05/09/2016 11:45 AM)   tiotropium (SPIRIVA HANDIHALER) 18 MCG inhalation capsule   Other Relevant Orders   DG Chest 2 View         I have discontinued Mr. Gulla's Tiotropium Bromide  Monohydrate. I have also changed his [place] to tiotropium. Additionally, I am having him maintain his methocarbamol, traMADol, polyethylene glycol powder, aspirin, atorvastatin, rOPINIRole, doxycycline, omeprazole, albuterol, and chlorpheniramine-HYDROcodone. We will continue to administer ipratropium-albuterol and methylPREDNISolone acetate.   Meds ordered this encounter  Medications  . albuterol (PROVENTIL HFA;VENTOLIN HFA) 108 (90 Base) MCG/ACT inhaler    Sig: Inhale 2 puffs into the lungs every 6 (six) hours as needed for wheezing.    Dispense:  1 Inhaler    Refill:  5    Order Specific Question:   Supervising Provider    Answer:   Derrel Nip, TERESA L [[id number]]  . chlorpheniramine-HYDROcodone (TUSSIONEX PENNKINETIC ER) 10-8 MG/5ML SUER    Sig: Take 5 mLs by mouth at bedtime as needed for  cough.    Dispense:  140 mL    Refill:  0    Order Specific Question:   Supervising Provider    Answer:   Derrel Nip, TERESA L [[id number]]  . ipratropium-albuterol (DUONEB) 0.5-2.5 (3) MG/3ML nebulizer solution 3 mL  . methylPREDNISolone acetate (DEPO-MEDROL) injection 40 mg  . tiotropium (SPIRIVA HANDIHALER) 18 MCG inhalation capsule    Sig: INHALE ONE CAPSULE AS DIRECTED ONCE A DAY    Dispense:  30 capsule    Refill:  1    Order Specific Question:   Supervising Provider    Answer:   Crecencio Mc [[id number]]    Return precautions given.   Risks, benefits, and alternatives of the medications and treatment plan prescribed today were discussed, and patient expressed understanding.   Education regarding symptom management and diagnosis given to patient on AVS.  Continue to follow with TULLO, Aris Everts, MD for routine health maintenance.   [name] and I agreed with plan.   Mable Paris, FNP

## 2016-05-16 ENCOUNTER — Telehealth: Payer: Self-pay | Admitting: *Deleted

## 2016-05-16 ENCOUNTER — Encounter: Payer: Self-pay | Admitting: Pulmonary Disease

## 2016-05-16 ENCOUNTER — Ambulatory Visit (INDEPENDENT_AMBULATORY_CARE_PROVIDER_SITE_OTHER): Payer: Medicare Other | Admitting: Pulmonary Disease

## 2016-05-16 VITALS — BP 142/84 | HR 72

## 2016-05-16 DIAGNOSIS — Z716 Tobacco abuse counseling: Secondary | ICD-10-CM | POA: Diagnosis not present

## 2016-05-16 DIAGNOSIS — J441 Chronic obstructive pulmonary disease with (acute) exacerbation: Secondary | ICD-10-CM | POA: Diagnosis not present

## 2016-05-16 DIAGNOSIS — R05 Cough: Secondary | ICD-10-CM

## 2016-05-16 DIAGNOSIS — J449 Chronic obstructive pulmonary disease, unspecified: Secondary | ICD-10-CM

## 2016-05-16 DIAGNOSIS — F17210 Nicotine dependence, cigarettes, uncomplicated: Secondary | ICD-10-CM

## 2016-05-16 DIAGNOSIS — R059 Cough, unspecified: Secondary | ICD-10-CM

## 2016-05-16 MED ORDER — TIOTROPIUM BROMIDE MONOHYDRATE 2.5 MCG/ACT IN AERS: 2.0000 | INHALATION_SPRAY | Freq: Every day | RESPIRATORY_TRACT | 0 refills | Status: DC

## 2016-05-16 NOTE — Telephone Encounter (Signed)
Pt;s wife requested chest Xray results Contact [name] [phone]

## 2016-05-16 NOTE — Patient Instructions (Addendum)
For your COPD: Start taking Anoro 1 puff daily Call me and tell me what respiratory medicines are covered under your medication formulary  For your cough: Use Neil Med rinses with distilled water at least twice per day using the instructions on the package. 1/2 hour after using the [facility] Med rinse, use Nasacort two puffs in each nostril once per day.  Remember that the Nasacort can take 1-2 weeks to work after regular use. Use generic zyrtec (cetirizine) every day.  If this doesn't help, then stop taking it and use chlorpheniramine-phenylephrine combination tablets.  For your tobacco abuse: Quit smoking immediately  Follow-up with me in 3 months

## 2016-05-16 NOTE — Progress Notes (Signed)
Subjective:    Patient ID: [name], male    DOB: Sep 01, 1945, 71 y.o.   MRN: [id number]  Synopsis: [name] first saw the [place] pulmonary clinic in July 2013 for a pulmonary nodule. He has a greater than 50-pack-year smoking history and quit in 2012. He was found on his initial visit to have gold stage II COPD but has minimal symptoms. The nodule was 6 mm and located in the left lower lobe there is also a 6 mm pleural-based nodule seen in the right middle lobe.  10/09/2011 CT chest [facility]: 6 mm round smooth solid pulmonary nodule in the left lower lobe 01/10/2012 CT chest [facility]: 6 mm nodule in LLL stable, stable 5.8 cm pleural based lesion RML 06/25/2012 CT Chest [facility] > 9mm nodule in LLL unchanged, scarring in lingula likely retained mucus 01/20/2013 CT chest [facility]> Stable 6mm nodule int he LLL unchanged September 2016 CT chest stable 6 mm nodule  Smoked 1-1/2-2 packs a day for 40 years, quit December 2012 11/06/2011 simple spirometry with clear airflow obstruction, FEV1 to FVC ratio 60%, FEV1 2.37 L (63% predicted) 11/06/2011 [facility] 0  HPI  Chief Complaint  Patient presents with  . Follow-up    pt had recent COPD exacerbation treated by PCP.  c/o increased SOB, prod cough with green mucus. CAT score 12   [name] had a flare up of his COPD recently.  He went to his PCP because he had increasing shortness of breath and cough.  He was treated with a prednisone injection and prednisone.  He had a lot of cough production.  He is still producing mucus now.  This is his second COPD exacerbation in the last 6 months.  His cough happens all the time, worse in the cold weather.   He has more cough at when he is lying flat.  He produces a lot of mucus then and again in the morning.    He has a lot of sinus drainage and blows his nose a lot.    He is not taking a long acting inhaler.    He is currnetly smoking less than 1/2 pack per day.      Past Medical History:  Diagnosis Date  . COPD  (chronic obstructive pulmonary disease) ([place])   . Coronary artery disease   . History of kidney stones   . History of prostate cancer   . Hyperlipidemia   . Hypertension   . MI (myocardial infarction) 03/2001  . Neuromuscular disorder ([place])      Review of Systems  Constitutional: Negative for chills, fatigue and fever.  HENT: Negative for congestion, rhinorrhea, sinus pressure and sneezing.   Respiratory: Negative for cough, shortness of breath and wheezing.   Cardiovascular: Negative for chest pain, palpitations and leg swelling.      Objective:   Physical Exam  Vitals:   05/16/16 1541  BP: (!) 142/84  BP Location: Right Arm  Cuff Size: Normal  Pulse: 72  SpO2: 92%   Gen: well appearing HENT: OP clear,  neck supple PULM: CTA B, normal percussion CV: RRR, no mgr, trace edema GI: BS+, soft, nontender Derm: no cyanosis or rash Psyche: normal mood and affect   Records from earlier this month as well as in the fall of last year with his primary care physician reviewed where he was treated both times for a COPD exacerbation with steroids  He was also recently prescribed Spriva  Chest x-ray images from 05/09/2016 personally reviewed emphysema, some chronic  bronchitic changes in the bases bilaterally      Assessment & Plan:   Impression: COPD Cigarette smoker Cough Postnasal drip  Discussion: Since stopping Spiriva last year [name] has had 2 exacerbations of COPD. While this is clearly due to ongoing tobacco use I can't deny that being off of a long-acting bronchodilator is implicated somehow here. His major concern is cost with medications. All of the symptoms come back to ongoing tobacco use. He has done a good job of cutting back. He was counseled at length today to quit smoking again. He was offered resources from the state of [place] and local resources through our health system.  For your COPD: Start taking Anoro 1 puff daily Call me and tell me what  respiratory medicines are covered under your medication formulary  For your cough: Use Neil Med rinses with distilled water at least twice per day using the instructions on the package. 1/2 hour after using the [facility] Med rinse, use Nasacort two puffs in each nostril once per day.  Remember that the Nasacort can take 1-2 weeks to work after regular use. Use generic zyrtec (cetirizine) every day.  If this doesn't help, then stop taking it and use chlorpheniramine-phenylephrine combination tablets.  For your tobacco abuse: Quit smoking immediately  Follow-up with me in 3 months Updated Medication List Outpatient Encounter Prescriptions as of 05/16/2016  Medication Sig Dispense Refill  . albuterol (PROVENTIL HFA;VENTOLIN HFA) 108 (90 Base) MCG/ACT inhaler Inhale 2 puffs into the lungs every 6 (six) hours as needed for wheezing. 1 Inhaler 5  . aspirin 81 MG tablet Take 81 mg by mouth daily.    [name] atorvastatin (LIPITOR) 40 MG tablet Take 1 tablet (40 mg total) by mouth daily at 6 PM. 90 tablet 3  . chlorpheniramine-HYDROcodone (TUSSIONEX PENNKINETIC ER) 10-8 MG/5ML SUER Take 5 mLs by mouth at bedtime as needed for cough. 140 mL 0  . methocarbamol (ROBAXIN) 500 MG tablet Take 1 tablet (500 mg total) by mouth 2 (two) times daily. 180 tablet 3  . omeprazole (PRILOSEC) 40 MG capsule Take 1 capsule (40 mg total) by mouth daily. 90 capsule 1  . polyethylene glycol powder (GLYCOLAX/MIRALAX) powder Take 17 g by mouth as needed.     [name] rOPINIRole (REQUIP) 0.5 MG tablet Take 1 tablet by mouth daily.    [name] tiotropium (SPIRIVA HANDIHALER) 18 MCG inhalation capsule INHALE ONE CAPSULE AS DIRECTED ONCE A DAY 30 capsule 1  . traMADol (ULTRAM) 50 MG tablet Take 1 tablet (50 mg total) by mouth 4 (four) times daily as needed. 120 tablet 3  . [DISCONTINUED] doxycycline (VIBRAMYCIN) 100 MG capsule Take 1 capsule (100 mg total) by mouth 2 (two) times daily. (Patient not taking: Reported on 05/16/2016) 20 capsule 0    Facility-Administered Encounter Medications as of 05/16/2016  Medication Dose Route Frequency Provider Last Rate Last Dose  . ipratropium-albuterol (DUONEB) 0.5-2.5 (3) MG/3ML nebulizer solution 3 mL  3 mL Nebulization Q6H Burnard Hawthorne, FNP

## 2016-05-16 NOTE — Telephone Encounter (Signed)
Pt's wife, [name] informed of below.   Written by Burnard Hawthorne, FNP on 05/09/2016 10:51 PM  Hi [name],   Good news. No pneumonia. Perhaps bronchitis ( this is usually viral) has triggered a COPD exacerbation.   Let me know if you are feeling better after depo medrol injection today.

## 2016-06-08 DIAGNOSIS — Z86010 Personal history of colonic polyps: Secondary | ICD-10-CM | POA: Diagnosis not present

## 2016-07-07 ENCOUNTER — Other Ambulatory Visit: Payer: Self-pay | Admitting: Cardiovascular Disease

## 2016-07-07 NOTE — Telephone Encounter (Signed)
Pt needs f/u appt with [facility]. Thank you 

## 2016-07-10 NOTE — Telephone Encounter (Signed)
LMOM TO SCHEDULE 1 YR F.U

## 2016-07-10 NOTE — Progress Notes (Signed)
Cardiology Office Note  Date:  07/11/2016   ID:  ERNESTINE LANGWORTHY, DOB [birth year], MRN [id number]  PCP:  Crecencio Mc, MD   Chief Complaint  Patient presents with  . other    Pt needing Refills. Meds reviewed verbally with pt.    HPI:  71 years old with Past medical history of CAD,  posterior MI in 2002 treated with a bare-metal stent to the circumflex artery,   back surgery by Dr. Patrice Paradise x2 In 2009 and 2010,  Prostate cancer with seed implantation Cardiac workup as below  negative Myoview scan 2010 with an ejection fraction of 60% (2010).  preop evaluation in February 2011 abnormal EKG noted on office visit in 2012,  stress test that showed anterior wall ischemia.  Cardiac catheterization was performed on January 05 2011 that showed 95% proximal LAD disease.  bare metal stent was placed ( 3.5 x 18 mm vision bare metal stent). He presents for follow up of his CAD, AAA 3.7 cm In 2013  In general he reports that he is doing well.  No regular exercise, chronic back pain Presents today because he was running out of his medications Previous stress,daughter with cancer. She is only 19, has been fighting cancer since age 38. Denies any shortness of breath or chest pain symptoms. Spends most of his time taking care of grand daughter He has severe arthritis in his hands, wrist Denies any chest pain or shortness of breath on exertion but limited in his ability to exert himself secondary to arthritic pain  Weight is up approximately 10 pounds over the past several months  Previous lab work reviewed with him in detail September 2017 showing total cholesterol 129, LDL 71 Vitamin D 27, Normal LFTs  EKG today shows sinus rhythm with rate 70 beats per minute with no significant ST or T-wave abnormality, consider old inferior MI, LVH with repolarization abnormality  Prior medical hx he is s/p back surgery with profound anemia noted after the surgery. He was admitted to the hospital  after routine blood work showed a HCT of 8. In the ER was noted to have hypotension. He had a transfusion, EGD, colonoscopy.   CT of the ABD did not show a bleed. He has received an iron infusion.   Previous CT scan showed abdominal aortic aneurysm 3.7 cm x 3.2 cm     PMH:   has a past medical history of COPD (chronic obstructive pulmonary disease) ([place]); Coronary artery disease; History of kidney stones; History of prostate cancer; Hyperlipidemia; Hypertension; MI (myocardial infarction) (03/2001); and Neuromuscular disorder ([place]).  PSH:    Past Surgical History:  Procedure Laterality Date  . BACK SURGERY  2009/2010   x 2  . CARDIAC CATHETERIZATION  2002   stent placement [place]  . FOOT SURGERY     bilateral   . HERNIA REPAIR     bilateral   . KNEE SURGERY     right knee   . PROSTATE SURGERY  09/2009   prostate implant  . SPINE SURGERY      Current Outpatient Prescriptions  Medication Sig Dispense Refill  . albuterol (PROVENTIL HFA;VENTOLIN HFA) 108 (90 Base) MCG/ACT inhaler Inhale 2 puffs into the lungs every 6 (six) hours as needed for wheezing. 1 Inhaler 5  . aspirin 81 MG tablet Take 81 mg by mouth daily.    [name] atorvastatin (LIPITOR) 40 MG tablet TAKE 1 TABLET BY MOUTH EVERY DAY AT 6PM 90 tablet 1  . chlorpheniramine-HYDROcodone (TUSSIONEX  PENNKINETIC ER) 10-8 MG/5ML SUER Take 5 mLs by mouth at bedtime as needed for cough. 140 mL 0  . methocarbamol (ROBAXIN) 500 MG tablet Take 1 tablet (500 mg total) by mouth 2 (two) times daily. 180 tablet 3  . omeprazole (PRILOSEC) 40 MG capsule Take 1 capsule (40 mg total) by mouth daily. 90 capsule 1  . polyethylene glycol powder (GLYCOLAX/MIRALAX) powder Take 17 g by mouth as needed.     [name] rOPINIRole (REQUIP) 0.5 MG tablet Take 1 tablet by mouth daily.    [name] tiotropium (SPIRIVA HANDIHALER) 18 MCG inhalation capsule INHALE ONE CAPSULE AS DIRECTED ONCE A DAY 30 capsule 1  . Tiotropium Bromide Monohydrate (SPIRIVA RESPIMAT) 2.5 MCG/ACT  AERS Inhale 2 puffs into the lungs daily. 1 Inhaler 0  . traMADol (ULTRAM) 50 MG tablet Take 1 tablet (50 mg total) by mouth 4 (four) times daily as needed. 120 tablet 3   Current Facility-Administered Medications  Medication Dose Route Frequency Provider Last Rate Last Dose  . ipratropium-albuterol (DUONEB) 0.5-2.5 (3) MG/3ML nebulizer solution 3 mL  3 mL Nebulization Q6H Burnard Hawthorne, FNP         Allergies:   Lyrica [pregabalin] and Prednisone   Social History:  The patient  reports that he has been smoking Cigarettes.  He has a 27.50 pack-year smoking history. He has never used smokeless tobacco. He reports that he does not drink alcohol or use drugs.   Family History:   family history includes Diabetes in his mother; Heart attack in his mother; Heart disease in his father.    Review of Systems: Review of Systems  Constitutional: Negative.   Respiratory: Negative.   Cardiovascular: Negative.   Gastrointestinal: Negative.   Musculoskeletal: Negative.   Neurological: Negative.   Psychiatric/Behavioral: Negative.   All other systems reviewed and are negative.    PHYSICAL EXAM: VS:  BP (!) 152/90 (BP Location: Left Arm, Patient Position: Sitting, Cuff Size: Normal)   Pulse 70   Ht 5\' 11"  (1.803 m)   Wt 207 lb 8 oz (94.1 kg)   BMI 28.94 kg/m  , BMI Body mass index is 28.94 kg/m. GEN: Well nourished, well developed, in no acute distress, Obese  HEENT: normal  Neck: no JVD, carotid bruits, or masses Cardiac: RRR; no murmurs, rubs, or gallops,no edema  Respiratory:  clear to auscultation bilaterally, normal work of breathing GI: soft, nontender, nondistended, + BS MS: no deformity or atrophy  Skin: warm and dry, no rash Neuro:  Strength and sensation are intact Psych: euthymic mood, full affect    Recent Labs: 12/28/2015: ALT 12; BUN 13; Creatinine, Ser 0.95; Hemoglobin 15.6; Platelets 182.0; Potassium 4.0; Sodium 140    Lipid Panel Lab Results  Component Value  Date   CHOL 129 12/28/2015   HDL 35.00 (L) 12/28/2015   LDLCALC 71 12/28/2015   TRIG 115.0 12/28/2015      Wt Readings from Last 3 Encounters:  07/11/16 207 lb 8 oz (94.1 kg)  05/09/16 203 lb 9.6 oz (92.4 kg)  02/02/16 197 lb (89.4 kg)       ASSESSMENT AND PLAN:  Mixed hyperlipidemia -  Cholesterol is at goal on the current lipid regimen. No changes to the medications were made.  Essential hypertension  Blood pressure running mildly elevated today, recommended he monitor blood pressure at home and Call our office with numbers We would add losartan or amlodipine  Atherosclerosis of native coronary artery with stable angina pectoris, unspecified whether native or transplanted  heart (HCC)  Currently with no symptoms of angina. No further workup at this time. Continue current medication regimen. Discussed symptoms to watch for  Centrilobular emphysema ([place]) -  Still smoking, one half pack per day  Tobacco abuse counseling - We have encouraged him to continue to work on weaning his cigarettes and smoking cessation. He will continue to work on this and does not want any assistance with chantix.   COPD exacerbation ([place]) -  Seen by pulmonary in [facility], uses inhalers  AAA We have ordered abdominal ultrasound 3.7 cm in 2013   Total encounter time more than 25 minutes  Greater than 50% was spent in counseling and coordination of care with the patient   Disposition:   F/U  6 months   Orders Placed This Encounter  Procedures  . EKG 12-Lead     Signed, Esmond Plants, M.D., Ph.D. 07/11/2016  [place], [place]

## 2016-07-11 ENCOUNTER — Encounter: Payer: Self-pay | Admitting: Cardiovascular Disease

## 2016-07-11 ENCOUNTER — Ambulatory Visit (INDEPENDENT_AMBULATORY_CARE_PROVIDER_SITE_OTHER): Payer: Medicare Other | Admitting: Cardiovascular Disease

## 2016-07-11 VITALS — BP 152/90 | HR 70 | Ht 71.0 in | Wt 207.5 lb

## 2016-07-11 DIAGNOSIS — E782 Mixed hyperlipidemia: Secondary | ICD-10-CM | POA: Diagnosis not present

## 2016-07-11 DIAGNOSIS — I25118 Atherosclerotic heart disease of native coronary artery with other forms of angina pectoris: Secondary | ICD-10-CM | POA: Diagnosis not present

## 2016-07-11 DIAGNOSIS — Z716 Tobacco abuse counseling: Secondary | ICD-10-CM

## 2016-07-11 DIAGNOSIS — J441 Chronic obstructive pulmonary disease with (acute) exacerbation: Secondary | ICD-10-CM

## 2016-07-11 DIAGNOSIS — I209 Angina pectoris, unspecified: Secondary | ICD-10-CM

## 2016-07-11 DIAGNOSIS — J432 Centrilobular emphysema: Secondary | ICD-10-CM

## 2016-07-11 DIAGNOSIS — I10 Essential (primary) hypertension: Secondary | ICD-10-CM | POA: Diagnosis not present

## 2016-07-11 NOTE — Patient Instructions (Addendum)
Medication Instructions:   No medication changes made  Labwork:  No new labs needed  Testing/Procedures:  We will order a abd ultrasound for AAA seen in 2013   I recommend watching educational videos on topics of interest to you at:       [email or web address]  Enter code: HEARTCARE    Follow-Up: It was a pleasure seeing you in the office today. Please call us if you have new issues that need to be addressed before your next appt.  [phone]  Your physician wants you to follow-up in: 6 months.  You will receive a reminder letter in the mail two months in advance. If you don't receive a letter, please call our office to schedule the follow-up appointment.  If you need a refill on your cardiac medications before your next appointment, please call your pharmacy.     Abdominal Aortic Aneurysm An aneurysm is a bulge in an artery. It happens when blood pushes up against a weakened or damaged artery wall. An abdominal aortic aneurysm is an aneurysm that occurs in the lower part of the aorta, the main artery of the body. The aorta supplies blood from the heart to the rest of the body. Some aneurysms may not cause symptoms or problems. However, the major concern with an abdominal aortic aneurysm is that it can enlarge and burst (rupture), or that blood can flow between the layers of the wall of the aorta through a tear (aorticdissection). Both of these conditions can cause bleeding inside the body and can be life-threatening unless diagnosed and treated right away. What are the causes? The exact cause of this condition is not known. What increases the risk? The following factors may make you more likely to develop this condition:  Being older than age 60.  Having a hardening of the arteries caused by the buildup of fat and other substances in the lining of a blood vessel (arteriosclerosis).  Having inflammation of the walls of an artery (arteritis).  Having a genetic disease that  weakens the body's connective tissue, such as Marfan syndrome.  Having abdominal trauma.  Having an infection, such as syphilis or staphylococcus, in the wall of the aorta (infectious aortitis) caused by bacteria.  Having high blood pressure (hypertension).  Being male.  Being white (Caucasian).  Having high cholesterol.  Having a family history of aneurysms.  Using tobacco.  Having chronic obstructive pulmonary disease (COPD). What are the signs or symptoms? Symptoms of this condition vary depending on the size and rate of growth of the aneurysm.Most aneurysms grow slowly and do not cause any symptoms. When symptoms do occur, they may include:  Pain in the abdomen, side, or lower back. The pain may vary in intensity.  Feeling full after eating only small amounts of food.  Feeling a pulsating lump in the abdomen. Symptoms that the aneurysm has ruptured include:  A sudden onset of severe pain in the abdomen, side, or lower back.  Nausea or vomiting.  Feeling faint or passing out. How is this diagnosed? This condition may be diagnosed with:  A physical exam. During the exam, your health care provider will check for throbbing in your abdomen. He or she may also listen to the blood flow in your abdomen.  Tests, such as:  An ultrasound.  X-rays.  A CT scan.  An MRI.  Tests to check your arteries for damage or blockage (angiogram). Because most unruptured abdominal aortic aneurysms cause no symptoms, they are often found during exams for other  conditions. How is this treated? Treatment for this condition depends on:  The size of the aneurysm.  How fast the aneurysm is growing.  Your age.  Risk factors for rupture. Aneurysms that are smaller than 2 inches (5 cm) may be managed by using medicines to control blood pressure, manage pain, or fight infection. You may need regular monitoring to see if the aneurysm is getting bigger. Your health care provider may  recommend that you have an ultrasound every few years, every year, or every 3-6 months. How often you need to have an ultrasound depends on the size of the aneurysm, how fast it is growing, and whether you have a family history of aneurysms. Surgical repair may be needed if your aneurysm is larger than 2 inches (5 cm). Follow these instructions at home: General instructions   Keep all follow-up visits as told by your health care provider. This is important.  Talk to your health care provider about regular screenings to see if the aneurysm is getting bigger.  Take over-the-counter and prescription medicines only as told by your health care provider.  Avoid heavy lifting and activities that take a lot of effort (are strenuous). Ask your health care provider what activities are safe for you. Lifestyle  Follow instructions from your health care provider about healthy lifestyle habits. Your health care provider may recommend:  Not using any products that contain nicotine or tobacco, such as cigarettes and e-cigarettes. If you need help quitting, ask your health care provider.  Limiting or avoiding alcohol.  Keeping your blood pressure within normal limits. The target limit for most people is below 120/80. Check your blood pressure regularly. If it is high, ask your health care provider about ways that you can control it.  Keeping your blood sugar level and cholesterol levels within normal limits. Target limits for most people are:  Blood sugar level: Less than 100 mg/dL.  Total cholesterol level: Less than 200 mg/dL.  Eating a healthy diet. This may include:  Lowering your salt (sodium) intake. In some people, too much salt can raise blood pressure and increase the risk of abdominal aortic aneurysm.  Avoiding foods that are high in saturated fat and cholesterol, such as red meat and dairy products.  Eating a diet that is low in sugar.  Increasing your fiber intake by including whole  grains, vegetables, and fruits in your diet. Eating these foods may help to lower blood pressure.  Maintaining a healthy weight.  Staying physically active and exercising regularly. Talk with your health care provider about how often you should exercise and which types of exercise are safe for you. Contact a health care provider if:  You have pain in your abdomen, side, or lower back.  You have a throbbing feeling in your abdomen.  You have a family history of aneurysms. Get help right away if:  You have sudden, severe pain in your abdomen or lower back.  You experience nausea or vomiting.  You have constipation or problems urinating.  You feel light-headed.  You have a rapid heart rate when you stand.  You have sweaty, clammy skin.  You have shortness of breath.  You have a fever. This information is not intended to replace advice given to you by your health care provider. Make sure you discuss any questions you have with your health care provider. Document Released: 01/11/2005 Document Revised: 10/27/2015 Document Reviewed: 09/21/2015 Elsevier Interactive Patient Education  2017 [employer].

## 2016-07-17 ENCOUNTER — Telehealth: Payer: Self-pay | Admitting: Cardiovascular Disease

## 2016-07-17 ENCOUNTER — Telehealth: Payer: Self-pay | Admitting: Internal Medicine

## 2016-07-17 NOTE — Telephone Encounter (Signed)
Pt wife called wanting to know if she can get a copy of the CT scan on 10/09/2011. It was ordered by Dr Derrel Nip. Please advise?  Call pt @ [phone]. Thank you!

## 2016-07-17 NOTE — Telephone Encounter (Signed)
Pt wife is calling, states Dr. Rockey Situ told pt last week he had an aneurysm. She states they were not aware of this. She would like to know when this was diagnosed and by whom. Please call and advise.

## 2016-07-17 NOTE — Telephone Encounter (Signed)
Spoke w/ pt's wife.  She states that has been present at most of pt's visits that she neither she nor pt were aware that pt has ever been diagnosed w/ AAA. Reviewed pt's previous imaging w/ her and provided dates of testing. Pt is sched to see Dr. Vira Agar later this week, she will be sure to mention this to him.  Pt is sched for ab u/s next month, but asks to be added to wait list in the event of a cancellation.  She is appreciative of the call.

## 2016-07-17 NOTE — Telephone Encounter (Signed)
Spoke with patient, results in envelope up front for pick up, thanks

## 2016-07-18 DIAGNOSIS — Z86010 Personal history of colonic polyps: Secondary | ICD-10-CM | POA: Diagnosis not present

## 2016-07-18 DIAGNOSIS — K22719 Barrett's esophagus with dysplasia, unspecified: Secondary | ICD-10-CM | POA: Diagnosis not present

## 2016-07-18 DIAGNOSIS — K621 Rectal polyp: Secondary | ICD-10-CM | POA: Diagnosis not present

## 2016-07-18 DIAGNOSIS — K648 Other hemorrhoids: Secondary | ICD-10-CM | POA: Diagnosis not present

## 2016-07-18 DIAGNOSIS — D122 Benign neoplasm of ascending colon: Secondary | ICD-10-CM | POA: Diagnosis not present

## 2016-07-18 DIAGNOSIS — D126 Benign neoplasm of colon, unspecified: Secondary | ICD-10-CM | POA: Diagnosis not present

## 2016-07-18 DIAGNOSIS — D123 Benign neoplasm of transverse colon: Secondary | ICD-10-CM | POA: Diagnosis not present

## 2016-07-18 DIAGNOSIS — D120 Benign neoplasm of cecum: Secondary | ICD-10-CM | POA: Diagnosis not present

## 2016-07-18 DIAGNOSIS — K2990 Gastroduodenitis, unspecified, without bleeding: Secondary | ICD-10-CM | POA: Diagnosis not present

## 2016-07-18 DIAGNOSIS — K620 Anal polyp: Secondary | ICD-10-CM | POA: Diagnosis not present

## 2016-07-18 DIAGNOSIS — D125 Benign neoplasm of sigmoid colon: Secondary | ICD-10-CM | POA: Diagnosis not present

## 2016-07-18 DIAGNOSIS — K635 Polyp of colon: Secondary | ICD-10-CM | POA: Diagnosis not present

## 2016-07-18 DIAGNOSIS — K2270 Barrett's esophagus without dysplasia: Secondary | ICD-10-CM | POA: Diagnosis not present

## 2016-07-18 DIAGNOSIS — K5140 Inflammatory polyps of colon without complications: Secondary | ICD-10-CM | POA: Diagnosis not present

## 2016-07-18 DIAGNOSIS — K2970 Gastritis, unspecified, without bleeding: Secondary | ICD-10-CM | POA: Diagnosis not present

## 2016-07-18 DIAGNOSIS — D124 Benign neoplasm of descending colon: Secondary | ICD-10-CM | POA: Diagnosis not present

## 2016-07-18 DIAGNOSIS — K640 First degree hemorrhoids: Secondary | ICD-10-CM | POA: Diagnosis not present

## 2016-07-26 DIAGNOSIS — Z8546 Personal history of malignant neoplasm of prostate: Secondary | ICD-10-CM | POA: Diagnosis not present

## 2016-07-31 ENCOUNTER — Other Ambulatory Visit: Payer: Self-pay | Admitting: Internal Medicine

## 2016-08-01 ENCOUNTER — Other Ambulatory Visit: Payer: Self-pay | Admitting: Cardiovascular Disease

## 2016-08-01 DIAGNOSIS — I714 Abdominal aortic aneurysm, without rupture, unspecified: Secondary | ICD-10-CM

## 2016-08-01 NOTE — Telephone Encounter (Signed)
Refilled: 02/10/2016 Last OV: 12/28/2015 Next OV: not scheduled Last labs: 12/28/2015

## 2016-08-04 DIAGNOSIS — Z8546 Personal history of malignant neoplasm of prostate: Secondary | ICD-10-CM | POA: Diagnosis not present

## 2016-08-04 DIAGNOSIS — R351 Nocturia: Secondary | ICD-10-CM | POA: Diagnosis not present

## 2016-08-15 ENCOUNTER — Encounter: Payer: Self-pay | Admitting: Family

## 2016-08-15 ENCOUNTER — Ambulatory Visit (INDEPENDENT_AMBULATORY_CARE_PROVIDER_SITE_OTHER): Payer: Medicare Other | Admitting: Family

## 2016-08-15 ENCOUNTER — Other Ambulatory Visit: Payer: Medicare Other

## 2016-08-15 VITALS — BP 140/82 | HR 73 | Temp 98.0°F | Ht 71.0 in | Wt 207.0 lb

## 2016-08-15 DIAGNOSIS — J441 Chronic obstructive pulmonary disease with (acute) exacerbation: Secondary | ICD-10-CM | POA: Diagnosis not present

## 2016-08-15 DIAGNOSIS — I25118 Atherosclerotic heart disease of native coronary artery with other forms of angina pectoris: Secondary | ICD-10-CM | POA: Diagnosis not present

## 2016-08-15 MED ORDER — HYDROCOD POLST-CPM POLST ER 10-8 MG/5ML PO SUER: 5.0000 mL | Freq: Every evening | ORAL | 0 refills | Status: DC | PRN

## 2016-08-15 MED ORDER — IPRATROPIUM-ALBUTEROL 0.5-2.5 (3) MG/3ML IN SOLN: 3.0000 mL | Freq: Four times a day (QID) | RESPIRATORY_TRACT | Status: DC

## 2016-08-15 MED ORDER — IPRATROPIUM-ALBUTEROL 0.5-2.5 (3) MG/3ML IN SOLN
3.0000 mL | Freq: Once | RESPIRATORY_TRACT | Status: AC
  Administered 2016-08-15: 3 mL via RESPIRATORY_TRACT

## 2016-08-15 MED ORDER — METHYLPREDNISOLONE ACETATE 40 MG/ML IJ SUSP
40.0000 mg | Freq: Once | INTRAMUSCULAR | Status: AC
  Administered 2016-08-15: 40 mg via INTRAMUSCULAR

## 2016-08-15 MED ORDER — DOXYCYCLINE HYCLATE 100 MG PO TABS: 100.0000 mg | ORAL_TABLET | Freq: Two times a day (BID) | ORAL | 0 refills | Status: DC

## 2016-08-15 NOTE — Patient Instructions (Signed)
As discussed, start doxycycline. You may use cough syrup at bedtime.   Please let us know do not improve on this regimen.

## 2016-08-15 NOTE — Progress Notes (Signed)
Pre visit review using our clinic review tool, if applicable. No additional management support is needed unless otherwise documented below in the visit note. 

## 2016-08-15 NOTE — Progress Notes (Signed)
Subjective:    Patient ID: [name], male    DOB: Oct 04, 1945, 71 y.o.   MRN: [id number]  CC: [name] is a 71 y.o. male who presents today for an acute visit.    HPI: CC: cough x 10 days, worsening.   Increased sputum, wheezing in coughing, sinus pressure.  Tried mucinex, albuterol, symbicort, and tessalon with some relief.   h/o COPD   Still smoking       HISTORY:  Past Medical History:  Diagnosis Date  . COPD (chronic obstructive pulmonary disease) ([place])   . Coronary artery disease   . History of kidney stones   . History of prostate cancer   . Hyperlipidemia   . Hypertension   . MI (myocardial infarction) ([place]) 03/2001  . Neuromuscular disorder [facility])    Past Surgical History:  Procedure Laterality Date  . BACK SURGERY  2009/2010   x 2  . CARDIAC CATHETERIZATION  2002   stent placement [place]  . FOOT SURGERY     bilateral   . HERNIA REPAIR     bilateral   . KNEE SURGERY     right knee   . PROSTATE SURGERY  09/2009   prostate implant  . SPINE SURGERY     Family History  Problem Relation Age of Onset  . Heart attack Mother   . Diabetes Mother   . Heart disease Father     Allergies: Lyrica [pregabalin] and Prednisone Current Outpatient Prescriptions on File Prior to Visit  Medication Sig Dispense Refill  . albuterol (PROVENTIL HFA;VENTOLIN HFA) 108 (90 Base) MCG/ACT inhaler Inhale 2 puffs into the lungs every 6 (six) hours as needed for wheezing. 1 Inhaler 5  . aspirin 81 MG tablet Take 81 mg by mouth daily.    [name] atorvastatin (LIPITOR) 40 MG tablet TAKE 1 TABLET BY MOUTH EVERY DAY AT 6PM 90 tablet 1  . methocarbamol (ROBAXIN) 500 MG tablet Take 1 tablet (500 mg total) by mouth 2 (two) times daily. 180 tablet 3  . omeprazole (PRILOSEC) 40 MG capsule TAKE ONE (1) CAPSULE EACH DAY 90 capsule 1  . polyethylene glycol powder (GLYCOLAX/MIRALAX) powder Take 17 g by mouth as needed.     [name] rOPINIRole (REQUIP) 0.5 MG tablet Take 1 tablet by mouth  daily.    . Tiotropium Bromide Monohydrate (SPIRIVA RESPIMAT) 2.5 MCG/ACT AERS Inhale 2 puffs into the lungs daily. 1 Inhaler 0  . traMADol (ULTRAM) 50 MG tablet Take 1 tablet (50 mg total) by mouth 4 (four) times daily as needed. 120 tablet 3   No current facility-administered medications on file prior to visit.     Social History  Substance Use Topics  . Smoking status: Current Every Day Smoker    Packs/day: 0.50    Years: 55.00    Types: Cigarettes  . Smokeless tobacco: Never Used     Comment: back to smoking X1 year  . Alcohol use No    Review of Systems  Constitutional: Negative for chills and fever.  Respiratory: Negative for cough.   Cardiovascular: Negative for chest pain and palpitations.  Gastrointestinal: Negative for nausea and vomiting.      Objective:    BP 140/82   Pulse 73   Temp 98 F (36.7 C) (Oral)   Ht 5\' 11"  (1.803 m)   Wt 207 lb (93.9 kg)   SpO2 93%   BMI 28.87 kg/m    Physical Exam  Constitutional: Vital signs are normal. He appears  well-developed and well-nourished.  HENT:  Head: Normocephalic and atraumatic.  Right Ear: Hearing, tympanic membrane, external ear and ear canal normal. No drainage, swelling or tenderness. Tympanic membrane is not injected, not erythematous and not bulging. No middle ear effusion. No decreased hearing is noted.  Left Ear: Hearing, tympanic membrane, external ear and ear canal normal. No drainage, swelling or tenderness. Tympanic membrane is not injected, not erythematous and not bulging.  No middle ear effusion. No decreased hearing is noted.  Nose: Nose normal. Right sinus exhibits no maxillary sinus tenderness and no frontal sinus tenderness. Left sinus exhibits no maxillary sinus tenderness and no frontal sinus tenderness.  Mouth/Throat: Uvula is midline, oropharynx is clear and moist and mucous membranes are normal. No oropharyngeal exudate, posterior oropharyngeal edema, posterior oropharyngeal erythema or  tonsillar abscesses.  Eyes: Conjunctivae are normal.  Cardiovascular: Regular rhythm and normal heart sounds.   Pulmonary/Chest: Effort normal. No respiratory distress. He has decreased breath sounds in the right lower field and the left lower field. He has no wheezes. He has no rhonchi. He has no rales.  Lymphadenopathy:       Head (right side): No submental, no submandibular, no tonsillar, no preauricular, no posterior auricular and no occipital adenopathy present.       Head (left side): No submental, no submandibular, no tonsillar, no preauricular, no posterior auricular and no occipital adenopathy present.    He has no cervical adenopathy.  Neurological: He is alert.  Skin: Skin is warm and dry.  Psychiatric: He has a normal mood and affect. His speech is normal and behavior is normal.  Vitals reviewed.  Patient felt slightly better after albuterol treatment. Lung sounds increased     Assessment & Plan:   Problem List Items Addressed This Visit      Respiratory   COPD exacerbation ([place]) - Primary    Modest clinical improvement after nebulizer treatment. SaO2 93%. Afebrile. Patient is well-appearing and in no acute respiratory distress. He is unable to tolerate oral steroids and we gave him 40 mg grams IM Depo-Medrol. Understands close vigilance. Also start doxycycline due to increased sputum. He will let me know how is doing.      Relevant Medications   ipratropium-albuterol (DUONEB) 0.5-2.5 (3) MG/3ML nebulizer solution 3 mL (Completed)   doxycycline (VIBRA-TABS) 100 MG tablet   methylPREDNISolone acetate (DEPO-MEDROL) injection 40 mg   chlorpheniramine-HYDROcodone (TUSSIONEX PENNKINETIC ER) 10-8 MG/5ML SUER        I have discontinued [name]'s chlorpheniramine-HYDROcodone and tiotropium. I am also having him start on doxycycline and chlorpheniramine-HYDROcodone. Additionally, I am having him maintain his methocarbamol, traMADol, polyethylene glycol powder, aspirin,  rOPINIRole, albuterol, Tiotropium Bromide Monohydrate, atorvastatin, and omeprazole. We will stop administering ipratropium-albuterol. Additionally, we administered ipratropium-albuterol. Additionally, we will continue to administer methylPREDNISolone acetate.   Meds ordered this encounter  Medications  . DISCONTD: ipratropium-albuterol (DUONEB) 0.5-2.5 (3) MG/3ML nebulizer solution 3 mL  . ipratropium-albuterol (DUONEB) 0.5-2.5 (3) MG/3ML nebulizer solution 3 mL  . doxycycline (VIBRA-TABS) 100 MG tablet    Sig: Take 1 tablet (100 mg total) by mouth 2 (two) times daily.    Dispense:  14 tablet    Refill:  0    Order Specific Question:   Supervising Provider    Answer:   Deborra Medina L [[id number]]  . methylPREDNISolone acetate (DEPO-MEDROL) injection 40 mg  . chlorpheniramine-HYDROcodone (TUSSIONEX PENNKINETIC ER) 10-8 MG/5ML SUER    Sig: Take 5 mLs by mouth at bedtime as needed for cough.  Dispense:  70 mL    Refill:  0    Order Specific Question:   Supervising Provider    Answer:   Crecencio Mc [[id number]]    Return precautions given.   Risks, benefits, and alternatives of the medications and treatment plan prescribed today were discussed, and patient expressed understanding.   Education regarding symptom management and diagnosis given to patient on AVS.  Continue to follow with TULLO, Aris Everts, MD for routine health maintenance.   [name] and I agreed with plan.   Mable Paris, FNP

## 2016-08-15 NOTE — Assessment & Plan Note (Signed)
Modest clinical improvement after nebulizer treatment. SaO2 93%. Afebrile. Patient is well-appearing and in no acute respiratory distress. He is unable to tolerate oral steroids and we gave him 40 mg grams IM Depo-Medrol. Understands close vigilance. Also start doxycycline due to increased sputum. He will let me know how is doing.

## 2016-08-16 ENCOUNTER — Ambulatory Visit: Payer: Medicare Other

## 2016-08-16 DIAGNOSIS — I714 Abdominal aortic aneurysm, without rupture, unspecified: Secondary | ICD-10-CM

## 2016-08-22 ENCOUNTER — Ambulatory Visit (INDEPENDENT_AMBULATORY_CARE_PROVIDER_SITE_OTHER)
Admission: RE | Admit: 2016-08-22 | Discharge: 2016-08-22 | Disposition: A | Discharge status: Home / Self Care | Payer: Medicare Other | Source: Ambulatory Visit | Attending: Pulmonary Disease | Admitting: Pulmonary Disease

## 2016-08-22 ENCOUNTER — Ambulatory Visit (INDEPENDENT_AMBULATORY_CARE_PROVIDER_SITE_OTHER): Payer: Medicare Other | Admitting: Pulmonary Disease

## 2016-08-22 ENCOUNTER — Encounter: Payer: Self-pay | Admitting: Pulmonary Disease

## 2016-08-22 VITALS — BP 144/82 | HR 72 | Ht 71.0 in | Wt 200.8 lb

## 2016-08-22 DIAGNOSIS — Z716 Tobacco abuse counseling: Secondary | ICD-10-CM

## 2016-08-22 DIAGNOSIS — J432 Centrilobular emphysema: Secondary | ICD-10-CM

## 2016-08-22 DIAGNOSIS — I25118 Atherosclerotic heart disease of native coronary artery with other forms of angina pectoris: Secondary | ICD-10-CM

## 2016-08-22 DIAGNOSIS — Z720 Tobacco use: Secondary | ICD-10-CM | POA: Diagnosis not present

## 2016-08-22 DIAGNOSIS — R0602 Shortness of breath: Secondary | ICD-10-CM | POA: Diagnosis not present

## 2016-08-22 DIAGNOSIS — R05 Cough: Secondary | ICD-10-CM | POA: Diagnosis not present

## 2016-08-22 MED ORDER — UMECLIDINIUM-VILANTEROL 62.5-25 MCG/INH IN AEPB: 1.0000 | INHALATION_SPRAY | Freq: Every day | RESPIRATORY_TRACT | 11 refills | Status: DC

## 2016-08-22 MED ORDER — UMECLIDINIUM-VILANTEROL 62.5-25 MCG/INH IN AEPB: 1.0000 | INHALATION_SPRAY | Freq: Every day | RESPIRATORY_TRACT | 0 refills | Status: DC

## 2016-08-22 NOTE — Patient Instructions (Signed)
Take Anoro 1 puff daily We will call you with the results of today's chest x-ray Quit smoking We will see you back in 3 months or sooner if needed We will refer you to the lung cancer screening program in [place].

## 2016-08-22 NOTE — Assessment & Plan Note (Signed)
He had another flareup recently which is due to ongoing tobacco use. I explained to him today and no uncertain terms that the only thing that will stop the cycles of recurrent exacerbations of COPD smoking cessation. He voiced understanding.  Plan: Melton Krebs, he is going to get his prescription from [place] Stop smoking Follow-up 3 months

## 2016-08-22 NOTE — Assessment & Plan Note (Signed)
Counseled again to quit smoking today. We'll refer to the [facility] lung cancer screening program.

## 2016-08-22 NOTE — Progress Notes (Signed)
Subjective:    Patient ID: [name], male    DOB: [birth year], 71 y.o.   MRN: [id number]  Synopsis: [name] first saw the [facility] in July 2013 for a pulmonary nodule. He has a greater than 50-pack-year smoking history and quit in 2012. He was found on his initial visit to have gold stage II COPD but has minimal symptoms. The nodule was 6 mm and located in the left lower lobe there is also a 6 mm pleural-based nodule seen in the right middle lobe.  10/09/2011 CT chest [facility]: 6 mm round smooth solid pulmonary nodule in the left lower lobe 01/10/2012 CT chest [facility]: 6 mm nodule in LLL stable, stable 5.8 cm pleural based lesion RML 06/25/2012 CT Chest [facility] > 33mm nodule in LLL unchanged, scarring in lingula likely retained mucus 01/20/2013 CT chest [facility]> Stable 44mm nodule int he LLL unchanged September 2016 CT chest stable 6 mm nodule  Smoked 1-1/2-2 packs a day for 40 years, quit December 2012 11/06/2011 simple spirometry with clear airflow obstruction, FEV1 to FVC ratio 60%, FEV1 2.37 L (63% predicted) 11/06/2011 [facility] 0  HPI  Chief Complaint  Patient presents with  . Follow-up    pt recently had increased prod cough- cracked a rib.  pt currently c/o prod cough with white mucus, sob with exertion.      Mads had another flare up of his COPD: needed to see his PCP's office and was treated with a course of antibiotics and a breathing treatment.  He cracked a rib he was coughing so much.  He has cut back significantly on smoking and is smoking 3 a day right now.  He has been told that he has an aortic aneurysm and has been told that   Anoro helped.    Past Medical History:  Diagnosis Date  . COPD (chronic obstructive pulmonary disease) ([place])   . Coronary artery disease   . History of kidney stones   . History of prostate cancer   . Hyperlipidemia   . Hypertension   . MI (myocardial infarction) ([place]) 03/2001  . Neuromuscular disorder ([place])      Review of  Systems  Constitutional: Negative for chills, fatigue and fever.  HENT: Negative for congestion, rhinorrhea, sinus pressure and sneezing.   Respiratory: Negative for cough, shortness of breath and wheezing.   Cardiovascular: Negative for chest pain, palpitations and leg swelling.      Objective:   Physical Exam  Vitals:   08/22/16 1404  BP: (!) 144/82  Pulse: 72  SpO2: 95%  Weight: 200 lb 12.8 oz (91.1 kg)  Height: 5\' 11"  (1.803 m)   Gen: well appearing HENT: OP clear, TM's clear, neck supple PULM: CTA B, normal percussion CV: RRR, no mgr, trace edema GI: BS+, soft, nontender Derm: no cyanosis or rash Psyche: normal mood and affect          Assessment & Plan:    Tobacco abuse counseling Counseled again to quit smoking today. We'll refer to the [facility] lung cancer screening program.  COPD (chronic obstructive pulmonary disease) He had another flareup recently which is due to ongoing tobacco use. I explained to him today and no uncertain terms that the only thing that will stop the cycles of recurrent exacerbations of COPD smoking cessation. He voiced understanding.  Plan: Melton Krebs, he is going to get his prescription from [place] Stop smoking Follow-up 3 months    Current Outpatient Prescriptions:  .  albuterol (PROVENTIL HFA;VENTOLIN  HFA) 108 (90 Base) MCG/ACT inhaler, Inhale 2 puffs into the lungs every 6 (six) hours as needed for wheezing., Disp: 1 Inhaler, Rfl: 5 .  aspirin 81 MG tablet, Take 81 mg by mouth daily., Disp: , Rfl:  .  atorvastatin (LIPITOR) 40 MG tablet, TAKE 1 TABLET BY MOUTH EVERY DAY AT 6PM, Disp: 90 tablet, Rfl: 1 .  chlorpheniramine-HYDROcodone (TUSSIONEX PENNKINETIC ER) 10-8 MG/5ML SUER, Take 5 mLs by mouth at bedtime as needed for cough., Disp: 70 mL, Rfl: 0 .  methocarbamol (ROBAXIN) 500 MG tablet, Take 1 tablet (500 mg total) by mouth 2 (two) times daily., Disp: 180 tablet, Rfl: 3 .  omeprazole (PRILOSEC) 40 MG capsule, TAKE ONE  (1) CAPSULE EACH DAY, Disp: 90 capsule, Rfl: 1 .  polyethylene glycol powder (GLYCOLAX/MIRALAX) powder, Take 17 g by mouth as needed. , Disp: , Rfl:  .  rOPINIRole (REQUIP) 0.5 MG tablet, Take 1 tablet by mouth daily., Disp: , Rfl:  .  Tiotropium Bromide Monohydrate (SPIRIVA RESPIMAT) 2.5 MCG/ACT AERS, Inhale 2 puffs into the lungs daily., Disp: 1 Inhaler, Rfl: 0 .  traMADol (ULTRAM) 50 MG tablet, Take 1 tablet (50 mg total) by mouth 4 (four) times daily as needed., Disp: 120 tablet, Rfl: 3 .  umeclidinium-vilanterol (ANORO ELLIPTA) 62.5-25 MCG/INH AEPB, Inhale 1 puff into the lungs daily., Disp: 60 each, Rfl: 11 .  umeclidinium-vilanterol (ANORO ELLIPTA) 62.5-25 MCG/INH AEPB, Inhale 1 puff into the lungs daily., Disp: 2 each, Rfl: 0

## 2016-08-23 ENCOUNTER — Telehealth: Payer: Self-pay | Admitting: *Deleted

## 2016-08-23 DIAGNOSIS — Z87891 Personal history of nicotine dependence: Secondary | ICD-10-CM

## 2016-08-23 NOTE — Telephone Encounter (Signed)
Received referral for initial lung cancer screening scan. Contacted patient and obtained smoking history,(current, 42 pack year) as well as answering questions related to screening process. Patient denies signs of lung cancer such as weight loss or hemoptysis. Patient denies comorbidity that would prevent curative treatment if lung cancer were found. Patient is scheduled for shared decision making visit and CT scan on 08/29/16.

## 2016-08-29 ENCOUNTER — Ambulatory Visit
Admission: RE | Admit: 2016-08-29 | Discharge: 2016-08-29 | Disposition: A | Discharge status: Home / Self Care | Payer: Medicare Other | Source: Ambulatory Visit | Attending: Oncology | Admitting: Oncology

## 2016-08-29 ENCOUNTER — Encounter: Payer: Self-pay | Admitting: Oncology

## 2016-08-29 ENCOUNTER — Inpatient Hospital Stay: Payer: Medicare Other | Attending: Oncology | Admitting: Oncology

## 2016-08-29 DIAGNOSIS — Z87891 Personal history of nicotine dependence: Secondary | ICD-10-CM | POA: Insufficient documentation

## 2016-08-29 DIAGNOSIS — I700 Atherosclerosis of aorta: Secondary | ICD-10-CM | POA: Diagnosis not present

## 2016-08-29 DIAGNOSIS — I2510 Atherosclerotic heart disease of native coronary artery without angina pectoris: Secondary | ICD-10-CM | POA: Diagnosis not present

## 2016-08-29 DIAGNOSIS — Z122 Encounter for screening for malignant neoplasm of respiratory organs: Secondary | ICD-10-CM | POA: Diagnosis not present

## 2016-08-29 DIAGNOSIS — F17210 Nicotine dependence, cigarettes, uncomplicated: Secondary | ICD-10-CM

## 2016-08-29 NOTE — Progress Notes (Signed)
In accordance with CMS guidelines, patient has met eligibility criteria including age, absence of signs or symptoms of lung cancer.  Social History  Substance Use Topics  . Smoking status: Current Every Day Smoker    Packs/day: 0.75    Years: 56.00    Types: Cigarettes  . Smokeless tobacco: Never Used     Comment: back to smoking X1 year  . Alcohol use No     A shared decision-making session was conducted prior to the performance of CT scan. This includes one or more decision aids, includes benefits and harms of screening, follow-up diagnostic testing, over-diagnosis, false positive rate, and total radiation exposure.  Counseling on the importance of adherence to annual lung cancer LDCT screening, impact of co-morbidities, and ability or willingness to undergo diagnosis and treatment is imperative for compliance of the program.  Counseling on the importance of continued smoking cessation for former smokers; the importance of smoking cessation for current smokers, and information about tobacco cessation interventions have been given to patient including [place] and 1800 quit [place] programs.  Written order for lung cancer screening with LDCT has been given to the patient and any and all questions have been answered to the best of my abilities.   Yearly follow up will be coordinated by Burgess Estelle, Thoracic Navigator.

## 2016-08-31 ENCOUNTER — Encounter: Payer: Self-pay | Admitting: *Deleted

## 2016-09-24 ENCOUNTER — Ambulatory Visit (HOSPITAL_COMMUNITY)
Admission: EM | Admit: 2016-09-24 | Discharge: 2016-09-24 | Disposition: A | Discharge status: Home / Self Care | Payer: Medicare Other | Attending: Internal Medicine | Admitting: Internal Medicine

## 2016-09-24 ENCOUNTER — Encounter (HOSPITAL_COMMUNITY): Payer: Self-pay | Admitting: Emergency Medicine

## 2016-09-24 DIAGNOSIS — M542 Cervicalgia: Secondary | ICD-10-CM | POA: Diagnosis not present

## 2016-09-24 DIAGNOSIS — S46812A Strain of other muscles, fascia and tendons at shoulder and upper arm level, left arm, initial encounter: Secondary | ICD-10-CM

## 2016-09-24 DIAGNOSIS — M62838 Other muscle spasm: Secondary | ICD-10-CM

## 2016-09-24 MED ORDER — METHYLPREDNISOLONE ACETATE 80 MG/ML IJ SUSP
INTRAMUSCULAR | Status: AC
  Filled 2016-09-24: qty 1

## 2016-09-24 MED ORDER — MELOXICAM 7.5 MG PO TABS: 7.5000 mg | ORAL_TABLET | Freq: Every day | ORAL | 0 refills | Status: DC

## 2016-09-24 MED ORDER — METHYLPREDNISOLONE ACETATE 80 MG/ML IJ SUSP
80.0000 mg | Freq: Once | INTRAMUSCULAR | Status: AC
  Administered 2016-09-24: 80 mg via INTRAMUSCULAR

## 2016-09-24 MED ORDER — HYDROCODONE-ACETAMINOPHEN 5-325 MG PO TABS: 1.0000 | ORAL_TABLET | Freq: Four times a day (QID) | ORAL | 0 refills | Status: DC | PRN

## 2016-09-24 NOTE — ED Provider Notes (Signed)
CSN: [id number]     Arrival date & time 09/24/16  1201 History   First MD Initiated Contact with Patient 09/24/16 1215     Chief Complaint  Patient presents with  . Shoulder Pain   (Consider location/radiation/quality/duration/timing/severity/associated sxs/prior Treatment) HPI  [name] is a 71 y.o. male presenting to UC with c/o 3 days of gradually worsening Left sided neck pain and stiffness radiating into Left shoulder. Symptoms started after doing a lot of yardwork including putting down mulch this past week. No specific known injury.  He has been taking Robaxin and Tramadol which provides temporary relief by about 2-4 hours.  Denies radiation of pain or numbness down arms or legs.  Pt notes he has done well with steroid injections before but cannot take the prednisone pills as he "feels like he is having a heart attack." denies hx of DM.   Past Medical History:  Diagnosis Date  . COPD (chronic obstructive pulmonary disease) ([place])   . Coronary artery disease   . History of kidney stones   . History of prostate cancer   . Hyperlipidemia   . Hypertension   . MI (myocardial infarction) ([place]) 03/2001  . Neuromuscular disorder [facility])    Past Surgical History:  Procedure Laterality Date  . BACK SURGERY  2009/2010   x 2  . CARDIAC CATHETERIZATION  2002   stent placement [place]  . FOOT SURGERY     bilateral   . HERNIA REPAIR     bilateral   . KNEE SURGERY     right knee   . PROSTATE SURGERY  09/2009   prostate implant  . SPINE SURGERY     Family History  Problem Relation Age of Onset  . Heart attack Mother   . Diabetes Mother   . Heart disease Father    Social History  Substance Use Topics  . Smoking status: Current Every Day Smoker    Packs/day: 0.75    Years: 56.00    Types: Cigarettes  . Smokeless tobacco: Never Used     Comment: back to smoking X1 year  . Alcohol use No    Review of Systems  Constitutional: Negative for chills and fever.    Musculoskeletal: Positive for arthralgias, back pain (Left upper), myalgias, neck pain and neck stiffness. Negative for gait problem and joint swelling.  Skin: Negative for color change, rash and wound.  Neurological: Negative for weakness and numbness.    Allergies  Lyrica [pregabalin] and Prednisone  Home Medications   Prior to Admission medications   Medication Sig Start Date End Date Taking? Authorizing Provider  albuterol (PROVENTIL HFA;VENTOLIN HFA) 108 (90 Base) MCG/ACT inhaler Inhale 2 puffs into the lungs every 6 (six) hours as needed for wheezing. 05/09/16 01/02/20  Burnard Hawthorne, FNP  aspirin 81 MG tablet Take 81 mg by mouth daily.    [provider]  atorvastatin (LIPITOR) 40 MG tablet TAKE 1 TABLET BY MOUTH EVERY DAY AT 6PM 07/11/16   Minna Merritts, MD  chlorpheniramine-HYDROcodone (TUSSIONEX PENNKINETIC ER) 10-8 MG/5ML SUER Take 5 mLs by mouth at bedtime as needed for cough. 08/15/16   Burnard Hawthorne, FNP  HYDROcodone-acetaminophen (NORCO/VICODIN) 5-325 MG tablet Take 1 tablet by mouth every 6 (six) hours as needed for severe pain. 09/24/16   Noland Fordyce, PA-C  meloxicam (MOBIC) 7.5 MG tablet Take 1-2 tablets (7.5-15 mg total) by mouth daily. For 5-7 days as needed for pain 09/24/16   Noland Fordyce, PA-C  methocarbamol (  ROBAXIN) 500 MG tablet Take 1 tablet (500 mg total) by mouth 2 (two) times daily. 05/01/11   Crecencio Mc, MD  omeprazole (PRILOSEC) 40 MG capsule TAKE ONE (1) CAPSULE EACH DAY 08/01/16   Crecencio Mc, MD  polyethylene glycol powder (GLYCOLAX/MIRALAX) powder Take 17 g by mouth as needed.  04/24/11   [provider]  rOPINIRole (REQUIP) 0.5 MG tablet Take 1 tablet by mouth daily. 07/20/15   [provider]  Tiotropium Bromide Monohydrate (SPIRIVA RESPIMAT) 2.5 MCG/ACT AERS Inhale 2 puffs into the lungs daily. 05/16/16   Juanito Doom, MD  traMADol (ULTRAM) 50 MG tablet Take 1 tablet (50 mg total) by mouth 4 (four) times  daily as needed. 05/04/11   Crecencio Mc, MD  umeclidinium-vilanterol (ANORO ELLIPTA) 62.5-25 MCG/INH AEPB Inhale 1 puff into the lungs daily. 08/22/16   Juanito Doom, MD  umeclidinium-vilanterol (ANORO ELLIPTA) 62.5-25 MCG/INH AEPB Inhale 1 puff into the lungs daily. 08/22/16   Juanito Doom, MD   Meds Ordered and Administered this Visit   Medications  methylPREDNISolone acetate (DEPO-MEDROL) injection 80 mg (80 mg Intramuscular Given 09/24/16 1231)    BP 137/81 (BP Location: Left Arm)   Pulse 68   Temp 98.3 F (36.8 C) (Oral)   Resp 16   SpO2 98%  No data found.   Physical Exam  Constitutional: He is oriented to person, place, and time. He appears well-developed and well-nourished. No distress.  Pt sitting on exam bed, NAD. Braces on both wrists.   HENT:  Head: Normocephalic and atraumatic.  Eyes: EOM are normal.  Neck: Muscular tenderness present. No spinous process tenderness present. Decreased range of motion present.    Tenderness to Left cervical muscles and upper trapezius. Limited head extension, flexion, and head rotation, less motion to Left than the Right.  Cardiovascular: Normal rate.   Pulmonary/Chest: Effort normal.  Musculoskeletal: He exhibits tenderness. He exhibits no edema.  No midline spinal tenderness. Tenderness to Left upper trapezius. Full ROM upper and lower extremities.   Neurological: He is alert and oriented to person, place, and time.  Skin: Skin is warm and dry. Capillary refill takes less than 2 seconds. He is not diaphoretic.  Psychiatric: He has a normal mood and affect. His behavior is normal.  Nursing note and vitals reviewed.   Urgent Care Course     Procedures (including critical care time)  Labs Review Labs Reviewed - No data to display  Imaging Review No results found.    MDM   1. Neck pain on left side   2. Muscle spasms of neck   3. Trapezius strain, left, initial encounter    No bony tenderness. Tenderness to  Left cervical muscles and Left upper trapezius.  Likely due to overuse from recent yard work.  No indication for imaging at this time. Pt has done well with steroid shots in the past Tx in UC: Depomedrol 80mg  IM  Rx: meloxicam for 5-7 days, norco for severe pain (advised not to take with his tramadol) F/u with PCP or Orthopedist in 1-2 weeks if not improving          Noland Fordyce, [place] 09/24/16 1312

## 2016-09-24 NOTE — Discharge Instructions (Signed)
° °  Norco/Vicodin (hydrocodone-acetaminophen) is a narcotic pain medication, do not combine these medications with others containing tylenol. While taking, do not drink alcohol, drive, or perform any other activities that requires focus while taking these medications.  Do NOT take your tramadol while taking this medication.  You should take one or the other but not both as this could lead to difficulty in breathing, balance, or even death if you take too much sedating medication at the same time.   Meloxicam (Mobic) is an antiinflammatory to help with pain and inflammation.  Do not take ibuprofen, Advil, Aleve, or any other medications that contain NSAIDs while taking meloxicam as this may cause stomach upset or even ulcers if taken in large amounts for an extended period of time.

## 2016-09-24 NOTE — ED Triage Notes (Signed)
The patient presented to the [facility] with a complaint of left shoulder and neck pain x 3 days. The patient denied any known injury.

## 2016-10-24 DIAGNOSIS — M4716 Other spondylosis with myelopathy, lumbar region: Secondary | ICD-10-CM | POA: Diagnosis not present

## 2016-11-16 ENCOUNTER — Ambulatory Visit: Payer: Medicare Other | Admitting: Pulmonary Disease

## 2016-11-16 NOTE — Progress Notes (Deleted)
Subjective:    Patient ID: [name], male    DOB: 03/23/1946, 71 y.o.   MRN: [id number]  Synopsis: [name] first saw the [place] [facility] in July 2013 for a pulmonary nodule. He has a greater than 50-pack-year smoking history and quit in 2012. He was found on his initial visit to have gold stage II COPD but has minimal symptoms. The nodule was 6 mm and located in the left lower lobe there is also a 6 mm pleural-based nodule seen in the right middle lobe.    Smoked 1-1/2-2 packs a day for 40 years, quit December 2012   HPI  No chief complaint on file.   ***   Past Medical History:  Diagnosis Date  . COPD (chronic obstructive pulmonary disease) ([place])   . Coronary artery disease   . History of kidney stones   . History of prostate cancer   . Hyperlipidemia   . Hypertension   . MI (myocardial infarction) ([place]) 03/2001  . Neuromuscular disorder ([place])      Review of Systems  Constitutional: Negative for chills, fatigue and fever.  HENT: Negative for congestion, rhinorrhea, sinus pressure and sneezing.   Respiratory: Negative for cough, shortness of breath and wheezing.   Cardiovascular: Negative for chest pain, palpitations and leg swelling.      Objective:   Physical Exam  There were no vitals filed for this visit.  ***  Imaging 10/09/2011 CT chest [facility]: 6 mm round smooth solid pulmonary nodule in the left lower lobe 01/10/2012 CT chest [facility]: 6 mm nodule in LLL stable, stable 5.8 cm pleural based lesion RML 06/25/2012 CT Chest [facility] > 37mm nodule in LLL unchanged, scarring in lingula likely retained mucus 01/20/2013 CT chest [facility]> Stable 17mm nodule int he LLL unchanged September 2016 CT chest stable 6 mm nodule  PFT 11/06/2011 simple spirometry with clear airflow obstruction, FEV1 to FVC ratio 60%, FEV1 2.37 L (63% predicted)  11/06/2011 [facility] 0     Assessment & Plan:   No diagnosis found.  Discussion: ***    Current Outpatient  Prescriptions:  .  albuterol (PROVENTIL HFA;VENTOLIN HFA) 108 (90 Base) MCG/ACT inhaler, Inhale 2 puffs into the lungs every 6 (six) hours as needed for wheezing., Disp: 1 Inhaler, Rfl: 5 .  aspirin 81 MG tablet, Take 81 mg by mouth daily., Disp: , Rfl:  .  atorvastatin (LIPITOR) 40 MG tablet, TAKE 1 TABLET BY MOUTH EVERY DAY AT 6PM, Disp: 90 tablet, Rfl: 1 .  chlorpheniramine-HYDROcodone (TUSSIONEX PENNKINETIC ER) 10-8 MG/5ML SUER, Take 5 mLs by mouth at bedtime as needed for cough., Disp: 70 mL, Rfl: 0 .  HYDROcodone-acetaminophen (NORCO/VICODIN) 5-325 MG tablet, Take 1 tablet by mouth every 6 (six) hours as needed for severe pain., Disp: 8 tablet, Rfl: 0 .  meloxicam (MOBIC) 7.5 MG tablet, Take 1-2 tablets (7.5-15 mg total) by mouth daily. For 5-7 days as needed for pain, Disp: 14 tablet, Rfl: 0 .  methocarbamol (ROBAXIN) 500 MG tablet, Take 1 tablet (500 mg total) by mouth 2 (two) times daily., Disp: 180 tablet, Rfl: 3 .  omeprazole (PRILOSEC) 40 MG capsule, TAKE ONE (1) CAPSULE EACH DAY, Disp: 90 capsule, Rfl: 1 .  polyethylene glycol powder (GLYCOLAX/MIRALAX) powder, Take 17 g by mouth as needed. , Disp: , Rfl:  .  rOPINIRole (REQUIP) 0.5 MG tablet, Take 1 tablet by mouth daily., Disp: , Rfl:  .  Tiotropium Bromide Monohydrate (SPIRIVA RESPIMAT) 2.5 MCG/ACT AERS, Inhale 2 puffs into the  lungs daily., Disp: 1 Inhaler, Rfl: 0 .  traMADol (ULTRAM) 50 MG tablet, Take 1 tablet (50 mg total) by mouth 4 (four) times daily as needed., Disp: 120 tablet, Rfl: 3 .  umeclidinium-vilanterol (ANORO ELLIPTA) 62.5-25 MCG/INH AEPB, Inhale 1 puff into the lungs daily., Disp: 60 each, Rfl: 11 .  umeclidinium-vilanterol (ANORO ELLIPTA) 62.5-25 MCG/INH AEPB, Inhale 1 puff into the lungs daily., Disp: 2 each, Rfl: 0

## 2016-12-27 ENCOUNTER — Ambulatory Visit: Payer: Medicare Other

## 2017-01-06 NOTE — Progress Notes (Signed)
Cardiology Office Note  Date:  01/08/2017   ID:  [name], DOB May 09, 1945, MRN [id number]  PCP:  Crecencio Mc, MD   Chief Complaint  Patient presents with  . other    6 month follow up. Meds reviewed by the pt. verbally. "doing well."     HPI:  71 years old with Past medical history of CAD,  posterior MI in 2002 treated with a bare-metal stent to the circumflex artery,   back surgery by Dr. Patrice Paradise x2 In 2009 and 2010,  Prostate cancer with seed implantation negative Myoview scan 2010 with an ejection fraction of 60% (2010). abnormal EKG noted on office visit in 2012,  stress test that showed anterior wall ischemia.  Cardiac catheterization was performed on January 05 2011 that showed 95% proximal LAD disease.  bare metal stent was placed ( 3.5 x 18 mm vision bare metal stent). AAA 3.7 cm In 2013, up to 4.2 ( 4.5 cm May 2018) He presents for follow up of his CAD and AAA  Discussed with him in detail, AAA 3.7 (2013) 4.2 to 4.5   (08/2016)  Denies any new symptoms No chest pain concerning for angina Denies any shortness of breath or chest pain symptoms. Spends most of his time taking care of grand daughter He has severe arthritis in his hands, wrist Denies any chest pain or shortness of breath on exertion but limited in his ability to exert himself secondary to arthritic pain  Previous lab work September 2017 showing total cholesterol 129, LDL 71 Vitamin D 27, Normal LFTs  EKG personally reviewed by myself today shows sinus rhythm with rate 67 beats per minute with no significant ST or T-wave abnormality, consider old inferior MI, LVH with repolarization abnormality No change in EKG compared to previous study  Prior medical hx he is s/p back surgery with profound anemia noted after the surgery. He was admitted to the hospital after routine blood work showed a HCT of 8. In the ER was noted to have hypotension. He had a transfusion, EGD, colonoscopy.   CT of the ABD  did not show a bleed. He has received an iron infusion.    PMH:   has a past medical history of COPD (chronic obstructive pulmonary disease) ([place]); Coronary artery disease; History of kidney stones; History of prostate cancer; Hyperlipidemia; Hypertension; MI (myocardial infarction) ([place]) (03/2001); and Neuromuscular disorder ([place]).  PSH:    Past Surgical History:  Procedure Laterality Date  . BACK SURGERY  2009/2010   x 2  . CARDIAC CATHETERIZATION  2002   stent placement [place]  . FOOT SURGERY     bilateral   . HERNIA REPAIR     bilateral   . KNEE SURGERY     right knee   . PROSTATE SURGERY  09/2009   prostate implant  . SPINE SURGERY      Current Outpatient Prescriptions  Medication Sig Dispense Refill  . albuterol (PROVENTIL HFA;VENTOLIN HFA) 108 (90 Base) MCG/ACT inhaler Inhale 2 puffs into the lungs every 6 (six) hours as needed for wheezing. 1 Inhaler 5  . aspirin 81 MG tablet Take 81 mg by mouth daily.    . chlorpheniramine-HYDROcodone (TUSSIONEX PENNKINETIC ER) 10-8 MG/5ML SUER Take 5 mLs by mouth at bedtime as needed for cough. 70 mL 0  . HYDROcodone-acetaminophen (NORCO/VICODIN) 5-325 MG tablet Take 1 tablet by mouth every 6 (six) hours as needed for severe pain. 8 tablet 0  . meloxicam (MOBIC) 7.5 MG tablet  Take 1-2 tablets (7.5-15 mg total) by mouth daily. For 5-7 days as needed for pain 14 tablet 0  . methocarbamol (ROBAXIN) 500 MG tablet Take 1 tablet (500 mg total) by mouth 2 (two) times daily. 180 tablet 3  . omeprazole (PRILOSEC) 40 MG capsule TAKE ONE (1) CAPSULE EACH DAY 90 capsule 1  . polyethylene glycol powder (GLYCOLAX/MIRALAX) powder Take 17 g by mouth as needed.     [name] rOPINIRole (REQUIP) 0.5 MG tablet Take 1 tablet by mouth daily.    . Tiotropium Bromide Monohydrate (SPIRIVA RESPIMAT) 2.5 MCG/ACT AERS Inhale 2 puffs into the lungs daily. 1 Inhaler 0  . traMADol (ULTRAM) 50 MG tablet Take 1 tablet (50 mg total) by mouth 4 (four) times daily as needed.  120 tablet 3  . umeclidinium-vilanterol (ANORO ELLIPTA) 62.5-25 MCG/INH AEPB Inhale 1 puff into the lungs daily. 60 each 11  . umeclidinium-vilanterol (ANORO ELLIPTA) 62.5-25 MCG/INH AEPB Inhale 1 puff into the lungs daily. 2 each 0  . atorvastatin (LIPITOR) 40 MG tablet TAKE 1 TABLET BY MOUTH EVERY DAY AT 6PM 90 tablet 3   No current facility-administered medications for this visit.      Allergies:   Lyrica [pregabalin] and Prednisone   Social History:  The patient  reports that he has been smoking Cigarettes.  He has a 42.00 pack-year smoking history. He has never used smokeless tobacco. He reports that he does not drink alcohol or use drugs.   Family History:   family history includes Diabetes in his mother; Heart attack in his mother; Heart disease in his father.    Review of Systems: Review of Systems  Constitutional: Negative.   Respiratory: Negative.   Cardiovascular: Negative.   Gastrointestinal: Negative.   Musculoskeletal: Positive for back pain and joint pain.  Neurological: Negative.   Psychiatric/Behavioral: Negative.   All other systems reviewed and are negative.    PHYSICAL EXAM: VS:  BP (!) 146/82 (BP Location: Left Arm, Patient Position: Sitting, Cuff Size: Normal)   Pulse 67   Ht 5\' 11"  (1.803 m)   Wt 203 lb 12 oz (92.4 kg)   BMI 28.42 kg/m  , BMI Body mass index is 28.42 kg/m.  GEN: Well nourished, well developed, in no acute distress, Obese  HEENT: normal  Neck: no JVD, carotid bruits, or masses Cardiac: RRR; no murmurs, rubs, or gallops,no edema  Respiratory:  clear to auscultation bilaterally, normal work of breathing GI: soft, nontender, nondistended, + BS MS: no deformity or atrophy  Skin: warm and dry, no rash Neuro:  Strength and sensation are intact Psych: euthymic mood, full affect    Recent Labs: No results found for requested labs within last 8760 hours.    Lipid Panel Lab Results  Component Value Date   CHOL 129 12/28/2015    HDL 35.00 (L) 12/28/2015   LDLCALC 71 12/28/2015   TRIG 115.0 12/28/2015      Wt Readings from Last 3 Encounters:  01/08/17 203 lb 12 oz (92.4 kg)  08/29/16 200 lb (90.7 kg)  08/22/16 200 lb 12.8 oz (91.1 kg)       ASSESSMENT AND PLAN:  Mixed hyperlipidemia -  Cholesterol is at goal on the current lipid regimen. No changes to the medications were made.  Essential hypertension  Blood pressure is well controlled on today's visit. No changes made to the medications.  Atherosclerosis of native coronary artery with stable angina pectoris, unspecified whether native or transplanted heart [facility])  Currently with no symptoms of  angina. No further workup at this time. Continue current medication regimen.Stable  Centrilobular emphysema (HCC) -  Still smoking, one half pack per day No desire to quit  Tobacco abuse counseling - We have encouraged him to continue to work on weaning his cigarettes and smoking cessation. He will continue to work on this and does not want any assistance with chantix.   COPD exacerbation ([place]) -  Seen by pulmonary in [facility], uses inhalers Stable, flu shot today  AAA We have ordered abdominal ultrasound 3.7 cm in 2013 Now up to 4.2 centimeters, 4.5 at its worst Repeat ultrasound in one year Would likely need a CT scan at some point   Total encounter time more than 25 minutes  Greater than 50% was spent in counseling and coordination of care with the patient   Disposition:   F/U  6 months   No orders of the defined types were placed in this encounter.    Signed, Esmond Plants, M.D., Ph.D. 01/08/2017  [place], [place]

## 2017-01-08 ENCOUNTER — Ambulatory Visit (INDEPENDENT_AMBULATORY_CARE_PROVIDER_SITE_OTHER): Payer: Medicare Other | Admitting: Cardiovascular Disease

## 2017-01-08 ENCOUNTER — Other Ambulatory Visit: Payer: Self-pay | Admitting: Cardiovascular Disease

## 2017-01-08 ENCOUNTER — Encounter: Payer: Self-pay | Admitting: Cardiovascular Disease

## 2017-01-08 VITALS — BP 146/82 | HR 67 | Ht 71.0 in | Wt 203.8 lb

## 2017-01-08 DIAGNOSIS — E782 Mixed hyperlipidemia: Secondary | ICD-10-CM

## 2017-01-08 DIAGNOSIS — Z23 Encounter for immunization: Secondary | ICD-10-CM

## 2017-01-08 DIAGNOSIS — I714 Abdominal aortic aneurysm, without rupture, unspecified: Secondary | ICD-10-CM

## 2017-01-08 DIAGNOSIS — Z716 Tobacco abuse counseling: Secondary | ICD-10-CM

## 2017-01-08 DIAGNOSIS — I209 Angina pectoris, unspecified: Secondary | ICD-10-CM

## 2017-01-08 DIAGNOSIS — J441 Chronic obstructive pulmonary disease with (acute) exacerbation: Secondary | ICD-10-CM

## 2017-01-08 DIAGNOSIS — I25118 Atherosclerotic heart disease of native coronary artery with other forms of angina pectoris: Secondary | ICD-10-CM

## 2017-01-08 DIAGNOSIS — I10 Essential (primary) hypertension: Secondary | ICD-10-CM

## 2017-01-08 DIAGNOSIS — Z87891 Personal history of nicotine dependence: Secondary | ICD-10-CM

## 2017-01-08 DIAGNOSIS — J432 Centrilobular emphysema: Secondary | ICD-10-CM

## 2017-01-08 NOTE — Addendum Note (Signed)
Addended by: Vanessa Ralphs on: 01/08/2017 04:16 PM   Modules accepted: Orders

## 2017-01-08 NOTE — Patient Instructions (Addendum)
Flu shot  Medication Instructions:   No medication changes made  Labwork:  Aorta u/s for AAA in 09/2017  Testing/Procedures:  No further testing at this time   Follow-Up: It was a pleasure seeing you in the office today. Please call us if you have new issues that need to be addressed before your next appt.  [phone]  Your physician wants you to follow-up in: 12 months.  You will receive a reminder letter in the mail two months in advance. If you don't receive a letter, please call our office to schedule the follow-up appointment.  If you need a refill on your cardiac medications before your next appointment, please call your pharmacy.

## 2017-01-24 ENCOUNTER — Other Ambulatory Visit: Payer: Self-pay | Admitting: Internal Medicine

## 2017-01-31 ENCOUNTER — Ambulatory Visit: Payer: Medicare Other | Admitting: Pulmonary Disease

## 2017-02-09 ENCOUNTER — Ambulatory Visit: Payer: Medicare Other | Admitting: Pulmonary Disease

## 2017-02-15 ENCOUNTER — Ambulatory Visit: Payer: Medicare Other | Admitting: Internal Medicine

## 2017-02-15 ENCOUNTER — Ambulatory Visit (INDEPENDENT_AMBULATORY_CARE_PROVIDER_SITE_OTHER): Payer: Medicare Other | Admitting: Family Medicine

## 2017-02-15 ENCOUNTER — Encounter: Payer: Self-pay | Admitting: Family Medicine

## 2017-02-15 DIAGNOSIS — I25118 Atherosclerotic heart disease of native coronary artery with other forms of angina pectoris: Secondary | ICD-10-CM

## 2017-02-15 DIAGNOSIS — J441 Chronic obstructive pulmonary disease with (acute) exacerbation: Secondary | ICD-10-CM | POA: Diagnosis not present

## 2017-02-15 MED ORDER — DOXYCYCLINE HYCLATE 100 MG PO TABS: 100.0000 mg | ORAL_TABLET | Freq: Two times a day (BID) | ORAL | 0 refills | Status: DC

## 2017-02-15 NOTE — Progress Notes (Signed)
Sick for several days, <1 week.  More cough, rhinorrhea, more sputum.  Still on inhalers, intermittently.  No fevers.  He has been more SOB, according to his wife.  He didn't agree, he didn't think he was more SOB.  More wheeze.  No vomiting, no diarrhea.  Was last sick like this more than a few months ago.  Today is a little better than yesterday.  Sputum is green.  At baseline he has less sputum and it isn't colored.    Wife was recently inpatient. D/w pt.    Meds, vitals, and allergies reviewed.   ROS: Per HPI unless specifically indicated in ROS section   GEN: nad, alert and oriented HEENT: mucous membranes moist, tm w/o erythema, nasal exam w/o erythema, clear discharge noted,  OP with cobblestoning NECK: supple w/o LA CV: rrr.   PULM: scant wheeze B, no inc wob, No focal dec in BS EXT: no edema SKIN: no acute rash Able to speak in complete sentences.

## 2017-02-15 NOTE — Patient Instructions (Signed)
Start doxycycline.   Use symbicort 2 puffs twice a day.  Then use albuterol on top of that if needed.  Take care.  Glad to see you.

## 2017-02-16 NOTE — Assessment & Plan Note (Addendum)
Likely COPD exacerbation. Start doxycycline. Restart inhalers. Use Symbicort 2 puffs twice a day. Use albuterol as needed. He agrees. Okay for outpatient follow-up. Update [place] as needed. Routed to PCP as FYI. ER cautions given.

## 2017-03-16 ENCOUNTER — Encounter: Payer: Self-pay | Admitting: Internal Medicine

## 2017-03-16 ENCOUNTER — Ambulatory Visit (INDEPENDENT_AMBULATORY_CARE_PROVIDER_SITE_OTHER): Payer: Medicare Other | Admitting: Internal Medicine

## 2017-03-16 VITALS — BP 124/70 | HR 73 | Temp 98.2°F | Wt 202.5 lb

## 2017-03-16 DIAGNOSIS — I25118 Atherosclerotic heart disease of native coronary artery with other forms of angina pectoris: Secondary | ICD-10-CM

## 2017-03-16 DIAGNOSIS — R062 Wheezing: Secondary | ICD-10-CM | POA: Diagnosis not present

## 2017-03-16 DIAGNOSIS — J449 Chronic obstructive pulmonary disease, unspecified: Secondary | ICD-10-CM

## 2017-03-16 MED ORDER — ALBUTEROL SULFATE (2.5 MG/3ML) 0.083% IN NEBU
2.5000 mg | INHALATION_SOLUTION | Freq: Once | RESPIRATORY_TRACT | Status: AC
  Administered 2017-03-16: 2.5 mg via RESPIRATORY_TRACT

## 2017-03-16 MED ORDER — HYDROCOD POLST-CPM POLST ER 10-8 MG/5ML PO SUER: 5.0000 mL | Freq: Every evening | ORAL | 0 refills | Status: DC | PRN

## 2017-03-16 MED ORDER — METHYLPREDNISOLONE ACETATE 80 MG/ML IJ SUSP
80.0000 mg | Freq: Once | INTRAMUSCULAR | Status: AC
  Administered 2017-03-16: 80 mg via INTRAMUSCULAR

## 2017-03-16 MED ORDER — IPRATROPIUM BROMIDE 0.02 % IN SOLN
0.5000 mg | Freq: Once | RESPIRATORY_TRACT | Status: AC
  Administered 2017-03-16: 0.5 mg via RESPIRATORY_TRACT

## 2017-03-16 NOTE — Patient Instructions (Signed)
Bronchospasm, Adult Bronchospasm is a tightening of the airways going into the lungs. During an episode, it may be harder to breathe. You may cough, and you may make a whistling sound when you breathe (wheeze). This condition often affects people with asthma. What are the causes? This condition is caused by swelling and irritation in the airways. It can be triggered by:  An infection (common).  Seasonal allergies.  An allergic reaction.  Exercise.  Irritants. These include pollution, cigarette smoke, strong odors, aerosol sprays, and paint fumes.  Weather changes. Winds increase molds and pollens in the air. Cold air may cause swelling.  Stress and emotional upset.  What are the signs or symptoms? Symptoms of this condition include:  Wheezing. If the episode was triggered by an allergy, wheezing may start right away or hours later.  Nighttime coughing.  Frequent or severe coughing with a simple cold.  Chest tightness.  Shortness of breath.  Decreased ability to exercise.  How is this diagnosed? This condition is usually diagnosed with a review of your medical history and a physical exam. Tests, such as lung function tests, are sometimes done to look for other conditions. The need for a chest X-ray depends on where the wheezing occurs and whether it is the first time you have wheezed. How is this treated? This condition may be treated with:  Inhaled medicines. These open up the airways and help you breathe. They can be taken with an inhaler or a nebulizer device.  Corticosteroid medicines. These may be given for severe bronchospasm, usually when it is associated with asthma.  Avoiding triggers, such as irritants, infection, or allergies.  Follow these instructions at home: Medicines  Take over-the-counter and prescription medicines only as told by your health care provider.  If you need to use an inhaler or nebulizer to take your medicine, ask your health care  provider to explain how to use it correctly. If you were given a spacer, always use it with your inhaler. Lifestyle  Reduce the number of triggers in your home. To do this: ? Change your heating and air conditioning filter at least once a month. ? Limit your use of fireplaces and wood stoves. ? Do not smoke. Do not allow smoking in your home. ? Avoid using perfumes and fragrances. ? Get rid of pests, such as roaches and mice, and their droppings. ? Remove any mold from your home. ? Keep your house clean and dust free. Use unscented cleaning products. ? Replace carpet with wood, tile, or vinyl flooring. Carpet can trap dander and dust. ? Use allergy-proof pillows, mattress covers, and box spring covers. ? Wash bed sheets and blankets every week in hot water. Dry them in a dryer. ? Use blankets that are made of polyester or cotton. ? Wash your hands often. ? Do not allow pets in your bedroom.  Avoid breathing in cold air when you exercise. General instructions  Have a plan for seeking medical care. Know when to call your health care provider and local emergency services, and where to get emergency care.  Stay up to date on your immunizations.  When you have an episode of bronchospasm, stay calm. Try to relax and breathe more slowly.  If you have asthma, make sure you have an asthma action plan.  Keep all follow-up visits as told by your health care provider. This is important. Contact a health care provider if:  You have muscle aches.  You have chest pain.  The mucus that you   cough up (sputum) changes from clear or white to yellow, green, gray, or bloody.  You have a fever.  Your sputum gets thicker. Get help right away if:  Your wheezing and coughing get worse, even after you take your prescribed medicines.  It gets even harder to breathe.  You develop severe chest pain. Summary  Bronchospasm is a tightening of the airways going into the lungs.  During an episode of  bronchospasm, you may have a harder time breathing. You may cough and make a whistling sound when you breathe (wheeze).  Avoid exposure to triggers such as smoke, dust, mold, animal dander, and fragrances.  When you have an episode of bronchospasm, stay calm. Try to relax and breathe more slowly. This information is not intended to replace advice given to you by your health care provider. Make sure you discuss any questions you have with your health care provider. Document Released: 04/06/2003 Document Revised: 03/30/2016 Document Reviewed: 03/30/2016 Elsevier Interactive Patient Education  2017 Elsevier Inc.  

## 2017-03-16 NOTE — Progress Notes (Signed)
HPI  Pt presents to the clinic today with c/o runny nose and cough. He reports this started 2 days ago. He is blowing clear mucous out of his nose. The cough is productive of green mucous. He denies nasal congestion, ear pain, sore throat. He reports he is no more short of breath than usual. He denies fever, chills or body aches. He has taken Symbicort without any relief. He reports he can not afford the Albuterol inhaler. He has an appt with Dr. [place] Bells on [facility] for follow up of COPD. He has not had sick contacts.  Review of Systems      Past Medical History:  Diagnosis Date  . COPD (chronic obstructive pulmonary disease) ([place])   . Coronary artery disease   . History of kidney stones   . History of prostate cancer   . Hyperlipidemia   . Hypertension   . MI (myocardial infarction) ([place]) 03/2001  . Neuromuscular disorder ([place])     Family History  Problem Relation Age of Onset  . Heart attack Mother   . Diabetes Mother   . Heart disease Father     Social History   Socioeconomic History  . Marital status: Married    Spouse name: Not on file  . Number of children: Not on file  . Years of education: Not on file  . Highest education level: Not on file  Social Needs  . Financial resource strain: Not on file  . Food insecurity - worry: Not on file  . Food insecurity - inability: Not on file  . Transportation needs - medical: Not on file  . Transportation needs - non-medical: Not on file  Occupational History  . Not on file  Tobacco Use  . Smoking status: Current Every Day Smoker    Packs/day: 0.75    Years: 56.00    Pack years: 42.00    Types: Cigarettes  . Smokeless tobacco: Never Used  . Tobacco comment: back to smoking X1 year  Substance and Sexual Activity  . Alcohol use: No    Alcohol/week: 0.0 oz  . Drug use: No  . Sexual activity: Not on file  Other Topics Concern  . Not on file  Social History Narrative  . Not on file    Allergies  Allergen Reactions  .  Lyrica [Pregabalin]     Swelling   . Prednisone     Feels like having a heart attack. Has had the injection form before and does well.     Constitutional: Denies headache, fatigue, fever or abrupt weight changes.  HEENT:  Positive runny nose. Denies eye redness, eye pain, pressure behind the eyes, facial pain, nasal congestion, ear pain, ringing in the ears, wax buildup, or sore throat. Respiratory: Positive cough. Denies difficulty breathing or shortness of breath.  Cardiovascular: Denies chest pain, chest tightness, palpitations or swelling in the hands or feet.   No other specific complaints in a complete review of systems (except as listed in HPI above).  Objective:   BP 124/70   Pulse 73   Temp 98.2 F (36.8 C) (Oral)   Wt 202 lb 8 oz (91.9 kg)   SpO2 94%   BMI 28.24 kg/m  Wt Readings from Last 3 Encounters:  03/16/17 202 lb 8 oz (91.9 kg)  02/15/17 202 lb (91.6 kg)  01/08/17 203 lb 12 oz (92.4 kg)     General: Appears his stated age, in NAD. HEENT: Ears: Tm's gray and intact, normal light reflex; Throat/Mouth: + PND.  Teeth present, mucosa erythematous and moist, no exudate noted, no lesions or ulcerations noted.  Neck: No cervical lymphadenopathy.  Pulmonary/Chest: Normal effort with diminshed breath sounds with intermittent expiratory wheeze noted in the RLL. No respiratory distress. No rales or ronchi noted.       Assessment & Plan:   Wheezing, COPD:  No indication for abx at this time They can not afford Albuterol, allergy to Prednisone, but he can take Depo Medrol per his report 80 mg Depo IM today Duoneb given in office today Rx for Tussionex cough syrup  RTC as needed or if symptoms persist.   Webb Silversmith, NP

## 2017-03-19 ENCOUNTER — Ambulatory Visit (INDEPENDENT_AMBULATORY_CARE_PROVIDER_SITE_OTHER): Payer: Medicare Other | Admitting: Pulmonary Disease

## 2017-03-19 ENCOUNTER — Telehealth: Payer: Self-pay | Admitting: Pulmonary Disease

## 2017-03-19 ENCOUNTER — Encounter: Payer: Self-pay | Admitting: Pulmonary Disease

## 2017-03-19 ENCOUNTER — Ambulatory Visit (INDEPENDENT_AMBULATORY_CARE_PROVIDER_SITE_OTHER)
Admission: RE | Admit: 2017-03-19 | Discharge: 2017-03-19 | Disposition: A | Discharge status: Home / Self Care | Payer: Medicare Other | Source: Ambulatory Visit | Attending: Pulmonary Disease | Admitting: Pulmonary Disease

## 2017-03-19 VITALS — BP 160/82 | HR 73 | Temp 98.6°F | Ht 72.0 in | Wt 202.4 lb

## 2017-03-19 DIAGNOSIS — J441 Chronic obstructive pulmonary disease with (acute) exacerbation: Secondary | ICD-10-CM

## 2017-03-19 DIAGNOSIS — I25118 Atherosclerotic heart disease of native coronary artery with other forms of angina pectoris: Secondary | ICD-10-CM | POA: Diagnosis not present

## 2017-03-19 DIAGNOSIS — R05 Cough: Secondary | ICD-10-CM | POA: Diagnosis not present

## 2017-03-19 MED ORDER — ALBUTEROL SULFATE HFA 108 (90 BASE) MCG/ACT IN AERS: 2.0000 | INHALATION_SPRAY | Freq: Four times a day (QID) | RESPIRATORY_TRACT | 5 refills | Status: DC | PRN

## 2017-03-19 MED ORDER — LEVOFLOXACIN 750 MG PO TABS: 750.0000 mg | ORAL_TABLET | Freq: Every day | ORAL | 0 refills | Status: DC

## 2017-03-19 MED ORDER — PREDNISONE 20 MG PO TABS: 20.0000 mg | ORAL_TABLET | Freq: Every day | ORAL | 0 refills | Status: DC

## 2017-03-19 MED ORDER — METHYLPREDNISOLONE ACETATE 80 MG/ML IJ SUSP
80.0000 mg | Freq: Once | INTRAMUSCULAR | Status: AC
  Administered 2017-03-19: 80 mg via INTRAMUSCULAR

## 2017-03-19 NOTE — Patient Instructions (Signed)
COPD exacerbation: Depo-Medrol injection today Chest x-ray Levaquin 750 mg daily times 5 days Keep using Symbicort as you are doing We will give you a new prescription for pro-air inhaler Please provide [place] with a sample of your mucus  Tobacco abuse: Quit smoking right away  Follow-up in 4-6 weeks so that we can figure out which cause you to have so many flareups of COPD.

## 2017-03-19 NOTE — Progress Notes (Signed)
Subjective:    Patient ID: [name], male    DOB: 04-14-1946, 71 y.o.   MRN: [id number]  Synopsis: [name] first saw the [place] [facility] in July 2013 for a pulmonary nodule. He has a greater than 50-pack-year smoking history and quit in 2012> started again though and is still smoking as of 2018. He was found on his initial visit to have gold stage II COPD but has minimal symptoms. The nodule was 6 mm and located in the left lower lobe there is also a 6 mm pleural-based nodule seen in the right middle lobe.  11/06/2011 MMRC 0  HPI  Chief Complaint  Patient presents with  . Follow-up    cough, congestion, wheezing   [name] says that he got really sick last Thursday and has been feeling badly since then.  He saw a PA at [facility] on Friday and was given a steroid injection.  He felt a lot worse on Saturday and he took two doxycycline that day.  He still feels like a mac truck hit him. No headache, no body aches.  No fever.  He has a lot of sinus congestion.  He has a severe cough.  He is producing mucus.  He has the albuterol and Symbicort.  He is still taking the Symbicort.    He is still smoking cigarettes, still smoking 1.5 ppd now.  He has smoked less with this illness.  He had been on an antibiotic earlier in the month.   Past Medical History:  Diagnosis Date  . COPD (chronic obstructive pulmonary disease) ([place])   . Coronary artery disease   . History of kidney stones   . History of prostate cancer   . Hyperlipidemia   . Hypertension   . MI (myocardial infarction) ([place]) 03/2001  . Neuromuscular disorder ([place])      Review of Systems  Constitutional: Negative for chills, fatigue and fever.  HENT: Negative for congestion, rhinorrhea, sinus pressure and sneezing.   Respiratory: Negative for cough, shortness of breath and wheezing.   Cardiovascular: Negative for chest pain, palpitations and leg swelling.      Objective:   Physical Exam  Vitals:   03/19/17  1026  BP: (!) 160/82  Pulse: 73  Temp: 98.6 F (37 C)  TempSrc: Oral  SpO2: 92%  Weight: 202 lb 6 oz (91.8 kg)  Height: 6' (1.829 m)   Gen: ill appearing HENT: OP clear, TM's clear, neck supple PULM: Crackles R base B, normal percussion CV: RRR, no mgr, trace edema GI: BS+, soft, nontender Derm: no cyanosis or rash Psyche: normal mood and affect  Chest imaging: 10/09/2011 CT chest [facility]: 6 mm round smooth solid pulmonary nodule in the left lower lobe 01/10/2012 CT chest [facility]: 6 mm nodule in LLL stable, stable 5.8 cm pleural based lesion RML 06/25/2012 CT Chest [facility] > 49m nodule in LLL unchanged, scarring in lingula likely retained mucus 01/20/2013 CT chest [facility]> Stable 698mnodule int he LLL unchanged September 2016 CT chest stable 6 mm nodule  PFT 11/06/2011 simple spirometry with clear airflow obstruction, FEV1 to FVC ratio 60%, FEV1 2.37 L (63% predicted)  CBC    Component Value Date/Time   WBC 10.9 (H) 12/28/2015 1524   RBC 5.08 12/28/2015 1524   HGB 15.6 12/28/2015 1524   HGB 14.9 02/27/2012 1406   HCT 45.8 12/28/2015 1524   HCT 45.2 02/27/2012 1406   PLT 182.0 12/28/2015 1524   PLT 196 02/27/2012 1406   MCV  90.2 12/28/2015 1524   MCV 93 02/27/2012 1406   MCH 30.7 02/27/2012 1406   MCH 31.4 01/03/2011 1420   MCHC 34.1 12/28/2015 1524   RDW 13.1 12/28/2015 1524   RDW 12.9 02/27/2012 1406   LYMPHSABS 1.7 12/28/2015 1524   LYMPHSABS 2.1 02/27/2012 1406   MONOABS 0.7 12/28/2015 1524   MONOABS 0.6 02/27/2012 1406   EOSABS 0.3 12/28/2015 1524   EOSABS 0.4 02/27/2012 1406   BASOSABS 0.0 12/28/2015 1524   BASOSABS 0.1 02/27/2012 1406   Records from his visit with [facility] office last week reviewed where he presented with sinus congestion and cough.  He was treated with a steroid injection     Assessment & Plan:   COPD exacerbation ([place]) - Plan: DG Chest 2 View,  MYCOBACTERIA, CULTURE, WITH FLUOROCHROME SMEAR, AFB Culture & Smear, Respiratory or Resp and Sputum  Culture, albuterol (PROVENTIL HFA;VENTOLIN HFA) 108 (90 Base) MCG/ACT inhaler  Discussion: Unfortunately [name] is having another exacerbation of COPD.  This is the second in the last month.  I am concerned about this because repeated exacerbations of COPD are associated with a poor prognosis from that condition.  I think the most likely cause is his ongoing tobacco use and the fact that they have a preschooler in the home bringing in a lot of infections.  However, you do consider atypical infections or resistant infections as a potential cause in a patient with COPD.  Differential diagnosis also includes conditions like allergic bronchopulmonary aspergillosis.  Going to need to go ahead and treat him again today for his COPD exacerbation but then I want him to come back in about 4-6 weeks of that we can try to get to the bottom of this.  We will send a sputum culture today.  Plan: COPD exacerbation: Depo-Medrol injection today Chest x-ray Levaquin 750 mg daily times 5 days Keep using Symbicort as you are doing We will give you a new prescription for pro-air inhaler Please provide [place] with a sample of your mucus  Tobacco abuse: Quit smoking right away  Follow-up in 4-6 weeks so that we can figure out which cause you to have so many flareups of COPD.    Current Outpatient Medications:  .  albuterol (PROVENTIL HFA;VENTOLIN HFA) 108 (90 Base) MCG/ACT inhaler, Inhale 2 puffs into the lungs every 6 (six) hours as needed for wheezing., Disp: 1 Inhaler, Rfl: 5 .  aspirin 81 MG tablet, Take 81 mg by mouth daily., Disp: , Rfl:  .  atorvastatin (LIPITOR) 40 MG tablet, TAKE 1 TABLET BY MOUTH EVERY DAY AT 6PM, Disp: 90 tablet, Rfl: 3 .  budesonide-formoterol (SYMBICORT) 160-4.5 MCG/ACT inhaler, Inhale 2 puffs into the lungs 2 (two) times daily., Disp: , Rfl:  .  chlorpheniramine-HYDROcodone (TUSSIONEX PENNKINETIC ER) 10-8 MG/5ML SUER, Take 5 mLs by mouth at bedtime as needed., Disp: 140 mL, Rfl: 0 .   methocarbamol (ROBAXIN) 500 MG tablet, Take 1 tablet (500 mg total) by mouth 2 (two) times daily., Disp: 180 tablet, Rfl: 3 .  omeprazole (PRILOSEC) 40 MG capsule, TAKE 1 CAPSULE BY MOUTH EVERY DAY, Disp: 90 capsule, Rfl: 1 .  polyethylene glycol powder (GLYCOLAX/MIRALAX) powder, Take 17 g by mouth as needed. , Disp: , Rfl:  .  traMADol (ULTRAM) 50 MG tablet, Take 1 tablet (50 mg total) by mouth 4 (four) times daily as needed., Disp: 120 tablet, Rfl: 3

## 2017-03-19 NOTE — Telephone Encounter (Signed)
Spoke with pt's wife and advised rx sent to pharmacy. Nothing further is needed.

## 2017-03-20 ENCOUNTER — Other Ambulatory Visit: Payer: Medicare Other

## 2017-03-20 DIAGNOSIS — J441 Chronic obstructive pulmonary disease with (acute) exacerbation: Secondary | ICD-10-CM | POA: Diagnosis not present

## 2017-03-23 LAB — RESPIRATORY CULTURE OR RESPIRATORY AND SPUTUM CULTURE
MICRO NUMBER:: 81361475
RESULT:: NORMAL
SPECIMEN QUALITY:: ADEQUATE

## 2017-04-06 ENCOUNTER — Encounter: Payer: Self-pay | Admitting: Pulmonary Disease

## 2017-04-06 ENCOUNTER — Ambulatory Visit (INDEPENDENT_AMBULATORY_CARE_PROVIDER_SITE_OTHER): Payer: Medicare Other | Admitting: Pulmonary Disease

## 2017-04-06 VITALS — BP 144/90 | HR 78 | Ht 72.0 in | Wt 197.8 lb

## 2017-04-06 DIAGNOSIS — Z720 Tobacco use: Secondary | ICD-10-CM

## 2017-04-06 DIAGNOSIS — I25118 Atherosclerotic heart disease of native coronary artery with other forms of angina pectoris: Secondary | ICD-10-CM | POA: Diagnosis not present

## 2017-04-06 DIAGNOSIS — F17210 Nicotine dependence, cigarettes, uncomplicated: Secondary | ICD-10-CM | POA: Diagnosis not present

## 2017-04-06 DIAGNOSIS — J432 Centrilobular emphysema: Secondary | ICD-10-CM

## 2017-04-06 MED ORDER — BUDESONIDE-FORMOTEROL FUMARATE 160-4.5 MCG/ACT IN AERO: 2.0000 | INHALATION_SPRAY | Freq: Two times a day (BID) | RESPIRATORY_TRACT | 0 refills | Status: DC

## 2017-04-06 NOTE — Addendum Note (Signed)
Addended by: Maryanna Shape A on: 04/06/2017 10:45 AM   Modules accepted: Orders

## 2017-04-06 NOTE — Patient Instructions (Signed)
COPD: Continue Symbicort 2 puffs twice a day Keep using albuterol as needed for chest tightness wheezing or shortness of breath Exercise regularly, practice good hand hygiene this time a year  Cigarette smoker: Stop smoking  We will see you back in 5 months or sooner if needed

## 2017-04-06 NOTE — Progress Notes (Signed)
Subjective:    Patient ID: [name], male    DOB: 1945-08-03, 71 y.o.   MRN: [id number]  Synopsis: [name] first saw the [place] [facility] in July 2013 for a pulmonary nodule. He has a greater than 50-pack-year smoking history and quit in 2012> started again though and is still smoking as of 2018. He was found on his initial visit to have gold stage II COPD but has minimal symptoms. The nodule was 6 mm and located in the left lower lobe there is also a 6 mm pleural-based nodule seen in the right middle lobe.  11/06/2011 MMRC 0  HPI  Chief Complaint  Patient presents with  . Follow-up    pt reports occ non prod cough.    He says he is feeling much better.  He took the antibiotic which helped quite a bit.  Steroid shot helped as well.  He is now back to breathing normally.  Minimal cough.  Still smoking some but he is cut down to about a pack a day.  He is still taking Symbicort, no problems with that.  Past Medical History:  Diagnosis Date  . COPD (chronic obstructive pulmonary disease) ([place])   . Coronary artery disease   . History of kidney stones   . History of prostate cancer   . Hyperlipidemia   . Hypertension   . MI (myocardial infarction) ([place]) 03/2001  . Neuromuscular disorder ([place])      Review of Systems  Constitutional: Negative for chills, fatigue and fever.  HENT: Negative for congestion, rhinorrhea, sinus pressure and sneezing.   Respiratory: Negative for cough, shortness of breath and wheezing.   Cardiovascular: Negative for chest pain, palpitations and leg swelling.      Objective:   Physical Exam  Vitals:   04/06/17 1024  BP: (!) 144/90  Pulse: 78  SpO2: 97%  Weight: 197 lb 12.8 oz (89.7 kg)  Height: 6' (1.829 m)    Gen: well appearing HENT: OP clear, TM's clear, neck supple PULM: CTA B, normal percussion CV: RRR, no mgr, trace edema GI: BS+, soft, nontender Derm: no cyanosis or rash Psyche: normal mood and affect   Chest  imaging: 10/09/2011 CT chest [facility]: 6 mm round smooth solid pulmonary nodule in the left lower lobe 01/10/2012 CT chest [facility]: 6 mm nodule in LLL stable, stable 5.8 cm pleural based lesion RML 06/25/2012 CT Chest [facility] > 41mm nodule in LLL unchanged, scarring in lingula likely retained mucus 01/20/2013 CT chest [facility]> Stable 49mm nodule int he LLL unchanged September 2016 CT chest stable 6 mm nodule 03/2017 CXR > emphysema, no acute abnormality  PFT 11/06/2011 simple spirometry with clear airflow obstruction, FEV1 to FVC ratio 60%, FEV1 2.37 L (63% predicted)  CBC    Component Value Date/Time   WBC 10.9 (H) 12/28/2015 1524   RBC 5.08 12/28/2015 1524   HGB 15.6 12/28/2015 1524   HGB 14.9 02/27/2012 1406   HCT 45.8 12/28/2015 1524   HCT 45.2 02/27/2012 1406   PLT 182.0 12/28/2015 1524   PLT 196 02/27/2012 1406   MCV 90.2 12/28/2015 1524   MCV 93 02/27/2012 1406   MCH 30.7 02/27/2012 1406   MCH 31.4 01/03/2011 1420   MCHC 34.1 12/28/2015 1524   RDW 13.1 12/28/2015 1524   RDW 12.9 02/27/2012 1406   LYMPHSABS 1.7 12/28/2015 1524   LYMPHSABS 2.1 02/27/2012 1406   MONOABS 0.7 12/28/2015 1524   MONOABS 0.6 02/27/2012 1406   EOSABS 0.3 12/28/2015 1524  EOSABS 0.4 02/27/2012 1406   BASOSABS 0.0 12/28/2015 1524   BASOSABS 0.1 02/27/2012 1406       Assessment & Plan:   Centrilobular emphysema (HCC)  Tobacco abuse  Discussion: He has recurrent exacerbations of COPD in the setting of ongoing tobacco use.  He is counseled again today to quit smoking at length.  He says as always "I am trying to cut back".  In general, this is the main thing he needs to do to stop the cycle of recurrent exacerbations.  I have counseled him that this is a poor prognostic sign.  Plan: COPD: Continue Symbicort 2 puffs twice a day Keep using albuterol as needed for chest tightness wheezing or shortness of breath Exercise regularly, practice good hand hygiene this time a year  Cigarette smoker: Stop  smoking  We will see you back in 5 months or sooner if needed   Current Outpatient Medications:  .  albuterol (PROVENTIL HFA;VENTOLIN HFA) 108 (90 Base) MCG/ACT inhaler, Inhale 2 puffs into the lungs every 6 (six) hours as needed for wheezing., Disp: 1 Inhaler, Rfl: 5 .  aspirin 81 MG tablet, Take 81 mg by mouth daily., Disp: , Rfl:  .  atorvastatin (LIPITOR) 40 MG tablet, TAKE 1 TABLET BY MOUTH EVERY DAY AT 6PM, Disp: 90 tablet, Rfl: 3 .  budesonide-formoterol (SYMBICORT) 160-4.5 MCG/ACT inhaler, Inhale 2 puffs into the lungs 2 (two) times daily., Disp: , Rfl:  .  chlorpheniramine-HYDROcodone (TUSSIONEX PENNKINETIC ER) 10-8 MG/5ML SUER, Take 5 mLs by mouth at bedtime as needed., Disp: 140 mL, Rfl: 0 .  methocarbamol (ROBAXIN) 500 MG tablet, Take 1 tablet (500 mg total) by mouth 2 (two) times daily., Disp: 180 tablet, Rfl: 3 .  omeprazole (PRILOSEC) 40 MG capsule, TAKE 1 CAPSULE BY MOUTH EVERY DAY, Disp: 90 capsule, Rfl: 1 .  polyethylene glycol powder (GLYCOLAX/MIRALAX) powder, Take 17 g by mouth as needed. , Disp: , Rfl:  .  traMADol (ULTRAM) 50 MG tablet, Take 1 tablet (50 mg total) by mouth 4 (four) times daily as needed., Disp: 120 tablet, Rfl: 3

## 2017-05-15 DIAGNOSIS — M4716 Other spondylosis with myelopathy, lumbar region: Secondary | ICD-10-CM | POA: Diagnosis not present

## 2017-05-15 DIAGNOSIS — G2581 Restless legs syndrome: Secondary | ICD-10-CM | POA: Diagnosis not present

## 2017-05-17 DIAGNOSIS — M79644 Pain in right finger(s): Secondary | ICD-10-CM | POA: Diagnosis not present

## 2017-05-17 DIAGNOSIS — M1811 Unilateral primary osteoarthritis of first carpometacarpal joint, right hand: Secondary | ICD-10-CM | POA: Diagnosis not present

## 2017-05-17 DIAGNOSIS — M79645 Pain in left finger(s): Secondary | ICD-10-CM | POA: Diagnosis not present

## 2017-05-17 DIAGNOSIS — M1812 Unilateral primary osteoarthritis of first carpometacarpal joint, left hand: Secondary | ICD-10-CM | POA: Diagnosis not present

## 2017-05-17 DIAGNOSIS — M180 Bilateral primary osteoarthritis of first carpometacarpal joints: Secondary | ICD-10-CM | POA: Diagnosis not present

## 2017-06-05 ENCOUNTER — Ambulatory Visit (INDEPENDENT_AMBULATORY_CARE_PROVIDER_SITE_OTHER): Payer: Medicare Other | Admitting: Internal Medicine

## 2017-06-05 ENCOUNTER — Encounter: Payer: Self-pay | Admitting: Internal Medicine

## 2017-06-05 VITALS — BP 140/80 | HR 79 | Temp 97.9°F | Wt 202.0 lb

## 2017-06-05 DIAGNOSIS — B9789 Other viral agents as the cause of diseases classified elsewhere: Secondary | ICD-10-CM | POA: Diagnosis not present

## 2017-06-05 DIAGNOSIS — J069 Acute upper respiratory infection, unspecified: Secondary | ICD-10-CM

## 2017-06-05 DIAGNOSIS — J449 Chronic obstructive pulmonary disease, unspecified: Secondary | ICD-10-CM | POA: Diagnosis not present

## 2017-06-05 NOTE — Progress Notes (Signed)
HPI  Pt presents to the clinic today with c/o headache, nasal congestion and cough. This started 3 days ago. He is blowing yellow mucous out of his nose. The cough is productive of green mucous. He denies fever, chills or body aches. He has tried Tylenol OTC with minimal relief. He has a history of COPD. He has had sick contacts.   Review of Systems      Past Medical History:  Diagnosis Date  . COPD (chronic obstructive pulmonary disease) ([place])   . Coronary artery disease   . History of kidney stones   . History of prostate cancer   . Hyperlipidemia   . Hypertension   . MI (myocardial infarction) ([place]) 03/2001  . Neuromuscular disorder ([place])     Family History  Problem Relation Age of Onset  . Heart attack Mother   . Diabetes Mother   . Heart disease Father     Social History   Socioeconomic History  . Marital status: Married    Spouse name: Not on file  . Number of children: Not on file  . Years of education: Not on file  . Highest education level: Not on file  Social Needs  . Financial resource strain: Not on file  . Food insecurity - worry: Not on file  . Food insecurity - inability: Not on file  . Transportation needs - medical: Not on file  . Transportation needs - non-medical: Not on file  Occupational History  . Not on file  Tobacco Use  . Smoking status: Current Every Day Smoker    Packs/day: 0.75    Years: 56.00    Pack years: 42.00    Types: Cigarettes  . Smokeless tobacco: Never Used  . Tobacco comment: back to smoking X1 year  Substance and Sexual Activity  . Alcohol use: No    Alcohol/week: 0.0 oz  . Drug use: No  . Sexual activity: Not on file  Other Topics Concern  . Not on file  Social History Narrative  . Not on file    Allergies  Allergen Reactions  . Lyrica [Pregabalin]     Swelling   . Prednisone     Feels like having a heart attack. Has had the injection form before and does well.     Constitutional: Positive headache.  Denies fatigue, fever or abrupt weight changes.  HEENT:  Positive nasal congestion. Denies eye redness, eye pain, pressure behind the eyes, facial pain, ear pain, ringing in the ears, wax buildup, runny nose or sore throat. Respiratory: Positive cough. Denies difficulty breathing or shortness of breath.  Cardiovascular: Denies chest pain, chest tightness, palpitations or swelling in the hands or feet.   No other specific complaints in a complete review of systems (except as listed in HPI above).  Objective:   BP 140/80   Pulse 79   Temp 97.9 F (36.6 C) (Oral)   Wt 202 lb (91.6 kg)   SpO2 96%   BMI 27.40 kg/m  Wt Readings from Last 3 Encounters:  06/05/17 202 lb (91.6 kg)  04/06/17 197 lb 12.8 oz (89.7 kg)  03/19/17 202 lb 6 oz (91.8 kg)     General: Appears his stated age, in NAD. HEENT: Head: normal shape and size, no sinus tenderness noted;  Ears: Tm's gray and intact, normal light reflex; Nose: mucosa pink and moist, septum midline; Throat/Mouth: + PND. Teeth present, mucosa erythematous and moist, no exudate noted, no lesions or ulcerations noted.  Neck: No cervical lymphadenopathy.  Pulmonary/Chest: Normal effort and diminshedar breath sounds. No respiratory distress. No wheezes, rales or ronchi noted.       Assessment & Plan:   Viral Upper Respiratory Infection with Cough:  Get some rest and drink plenty of water Start Zyrtec and Flonase OTC 80 mg Depo IM today Delsym as needed for cough  RTC as needed or if symptoms persist.   Webb Silversmith, NP

## 2017-06-05 NOTE — Patient Instructions (Signed)
Upper Respiratory Infection, Adult Most upper respiratory infections (URIs) are caused by a virus. A URI affects the nose, throat, and upper air passages. The most common type of URI is often called "the common cold." Follow these instructions at home:  Take medicines only as told by your doctor.  Gargle warm saltwater or take cough drops to comfort your throat as told by your doctor.  Use a warm mist humidifier or inhale steam from a shower to increase air moisture. This may make it easier to breathe.  Drink enough fluid to keep your pee (urine) clear or pale yellow.  Eat soups and other clear broths.  Have a healthy diet.  Rest as needed.  Go back to work when your fever is gone or your doctor says it is okay. ? You may need to stay home longer to avoid giving your URI to others. ? You can also wear a face mask and wash your hands often to prevent spread of the virus.  Use your inhaler more if you have asthma.  Do not use any tobacco products, including cigarettes, chewing tobacco, or electronic cigarettes. If you need help quitting, ask your doctor. Contact a doctor if:  You are getting worse, not better.  Your symptoms are not helped by medicine.  You have chills.  You are getting more short of breath.  You have brown or red mucus.  You have yellow or brown discharge from your nose.  You have pain in your face, especially when you bend forward.  You have a fever.  You have puffy (swollen) neck glands.  You have pain while swallowing.  You have white areas in the back of your throat. Get help right away if:  You have very bad or constant: ? Headache. ? Ear pain. ? Pain in your forehead, behind your eyes, and over your cheekbones (sinus pain). ? Chest pain.  You have long-lasting (chronic) lung disease and any of the following: ? Wheezing. ? Long-lasting cough. ? Coughing up blood. ? A change in your usual mucus.  You have a stiff neck.  You have  changes in your: ? Vision. ? Hearing. ? Thinking. ? Mood. This information is not intended to replace advice given to you by your health care provider. Make sure you discuss any questions you have with your health care provider. Document Released: 09/20/2007 Document Revised: 12/05/2015 Document Reviewed: 07/09/2013 Elsevier Interactive Patient Education  2018 Elsevier Inc.  

## 2017-06-06 MED ORDER — METHYLPREDNISOLONE ACETATE 80 MG/ML IJ SUSP
80.0000 mg | Freq: Once | INTRAMUSCULAR | Status: AC
Start: 2017-06-06 — End: 2017-06-05
  Administered 2017-06-05: 80 mg via INTRAMUSCULAR

## 2017-06-06 NOTE — Addendum Note (Signed)
Addended by: Lurlean Nanny on: 06/06/2017 04:57 PM   Modules accepted: Orders

## 2017-06-14 ENCOUNTER — Ambulatory Visit (INDEPENDENT_AMBULATORY_CARE_PROVIDER_SITE_OTHER): Payer: Medicare Other | Admitting: Family Medicine

## 2017-06-14 ENCOUNTER — Encounter: Payer: Self-pay | Admitting: Family Medicine

## 2017-06-14 VITALS — BP 124/66 | HR 74 | Temp 98.0°F | Resp 15 | Wt 198.0 lb

## 2017-06-14 DIAGNOSIS — J209 Acute bronchitis, unspecified: Secondary | ICD-10-CM | POA: Diagnosis not present

## 2017-06-14 MED ORDER — DOXYCYCLINE HYCLATE 100 MG PO TABS
100.0000 mg | ORAL_TABLET | Freq: Two times a day (BID) | ORAL | 0 refills | Status: DC
Start: 2017-06-14 — End: 2017-12-20

## 2017-06-14 NOTE — Progress Notes (Signed)
Patient ID: CAMRIN GEARHEART, male   DOB: 1945/11/17, 72 y.o.   MRN: [id number] PCP: Crecencio Mc, MD  Subjective:  [name] is a 72 y.o. year old very pleasant male patient who presents with Upper Respiratory infection symptoms including nasal congestion, rhinitis, cough that is productive with green/yellow sputum -started: 10 days ago  symptoms improved but then returned -previous treatments: acetaminophen, mucinex have provided limited benefit. Flonase and Zyrtec have provided limited benefit. He had 80 mg Depo 06/05/17 which provided moderate benefit -sick contacts/travel/risks: denies flu exposure. Recent sick contact exposure with sick granddaughter -Hx of: COPD, HTN Current everyday smoker, smokes 0.5 ppd  ROS-denies fever, SOB, NVD, tooth pain  Pertinent Past Medical History- COPD, HTN  Medications- reviewed  Current Outpatient Medications  Medication Sig Dispense Refill  . albuterol (PROVENTIL HFA;VENTOLIN HFA) 108 (90 Base) MCG/ACT inhaler Inhale 2 puffs into the lungs every 6 (six) hours as needed for wheezing. 1 Inhaler 5  . aspirin 81 MG tablet Take 81 mg by mouth daily.    [name] atorvastatin (LIPITOR) 40 MG tablet TAKE 1 TABLET BY MOUTH EVERY DAY AT 6PM 90 tablet 3  . budesonide-formoterol (SYMBICORT) 160-4.5 MCG/ACT inhaler Inhale 2 puffs into the lungs 2 (two) times daily.    . chlorpheniramine-HYDROcodone (TUSSIONEX PENNKINETIC ER) 10-8 MG/5ML SUER Take 5 mLs by mouth at bedtime as needed. 140 mL 0  . methocarbamol (ROBAXIN) 500 MG tablet Take 1 tablet (500 mg total) by mouth 2 (two) times daily. 180 tablet 3  . omeprazole (PRILOSEC) 40 MG capsule TAKE 1 CAPSULE BY MOUTH EVERY DAY 90 capsule 1  . polyethylene glycol powder (GLYCOLAX/MIRALAX) powder Take 17 g by mouth as needed.     . traMADol (ULTRAM) 50 MG tablet Take 1 tablet (50 mg total) by mouth 4 (four) times daily as needed. 120 tablet 3   No current facility-administered medications for this visit.      Objective: BP 124/66 (BP Location: Left Arm, Patient Position: Sitting, Cuff Size: Normal)   Pulse 74   Temp 98 F (36.7 C) (Oral)   Resp 15   Wt 193 lb (87.5 kg)   SpO2 93%   BMI 26.18 kg/m  Gen: NAD, resting comfortably HEENT: Turbinates erythematous, TMs normal bilaterally, pharynx mildly erythematous with no tonsilar exudate or edema, no sinus tenderness CV: RRR no murmurs rubs or gallops Lungs: Minimal scattered wheezing present; no crackles or rhonchi Abdomen: soft/nontender/nondistended/normal bowel sounds. No rebound or guarding.  Ext: no edema Skin: warm, dry, no rash Neuro: grossly normal, moves all extremities  Assessment/Plan: 1. Acute bronchitis, unspecified organism Symptom duration of 10 days and double sickening presentation are likely due to bronchitis. Discussed likely viral nature and less likely bacterial cause. Minimal scattered wheezing present, VSS and NAD make pneumonia unlikely. Discussed possible treatment options and recent depo provided benefit however he is not willing to take oral prednisone due to prior adverse reaction.  Also provided with a prescription of doxycycline to take if not improved in the next 2-3 days. Advised that if his symptoms were to worsen he should let us know. He has a cough suppressant that was provided to him 10 days ago.[name] He is given return precautions.  - doxycycline (VIBRA-TABS) 100 MG tablet; Take 1 tablet (100 mg total) by mouth 2 (two) times daily.  Dispense: 20 tablet; Refill: 0  Finally, we reviewed reasons to return to care including if symptoms worsen or persist or new concerns arise- once again particularly shortness  of breath or fever.    Laurita Quint, FNP

## 2017-06-14 NOTE — Patient Instructions (Signed)
Please take medication as directed. Please drink plenty of water so that your urine is pale yellow or clear. Also, get plenty of rest, and follow up if symptoms do not improve in 3 to 4 days, worsen, or you develop a fever >101.   Acute Bronchitis, Adult Acute bronchitis is when air tubes (bronchi) in the lungs suddenly get swollen. The condition can make it hard to breathe. It can also cause these symptoms:  A cough.  Coughing up clear, yellow, or green mucus.  Wheezing.  Chest congestion.  Shortness of breath.  A fever.  Body aches.  Chills.  A sore throat.  Follow these instructions at home: Medicines  Take over-the-counter and prescription medicines only as told by your doctor.  If you were prescribed an antibiotic medicine, take it as told by your doctor. Do not stop taking the antibiotic even if you start to feel better. General instructions  Rest.  Drink enough fluids to keep your pee (urine) clear or pale yellow.  Avoid smoking and secondhand smoke. If you smoke and you need help quitting, ask your doctor. Quitting will help your lungs heal faster.  Use an inhaler, cool mist vaporizer, or humidifier as told by your doctor.  Keep all follow-up visits as told by your doctor. This is important. How is this prevented? To lower your risk of getting this condition again:  Wash your hands often with soap and water. If you cannot use soap and water, use hand sanitizer.  Avoid contact with people who have cold symptoms.  Try not to touch your hands to your mouth, nose, or eyes.  Make sure to get the flu shot every year.  Contact a doctor if:  Your symptoms do not get better in 2 weeks. Get help right away if:  You cough up blood.  You have chest pain.  You have very bad shortness of breath.  You become dehydrated.  You faint (pass out) or keep feeling like you are going to pass out.  You keep throwing up (vomiting).  You have a very bad  headache.  Your fever or chills gets worse. This information is not intended to replace advice given to you by your health care provider. Make sure you discuss any questions you have with your health care provider. Document Released: 09/20/2007 Document Revised: 11/10/2015 Document Reviewed: 09/22/2015 Elsevier Interactive Patient Education  [employer].

## 2017-07-10 DIAGNOSIS — G2581 Restless legs syndrome: Secondary | ICD-10-CM | POA: Diagnosis not present

## 2017-07-10 DIAGNOSIS — M4716 Other spondylosis with myelopathy, lumbar region: Secondary | ICD-10-CM | POA: Diagnosis not present

## 2017-07-23 ENCOUNTER — Other Ambulatory Visit: Payer: Self-pay | Admitting: Internal Medicine

## 2017-07-24 NOTE — Telephone Encounter (Signed)
Refilled: 01/24/2017 Last OV: 12/28/2015 Next OV: not scheduled

## 2017-08-02 DIAGNOSIS — Z8546 Personal history of malignant neoplasm of prostate: Secondary | ICD-10-CM | POA: Diagnosis not present

## 2017-08-09 DIAGNOSIS — R351 Nocturia: Secondary | ICD-10-CM | POA: Diagnosis not present

## 2017-08-09 DIAGNOSIS — Z8546 Personal history of malignant neoplasm of prostate: Secondary | ICD-10-CM | POA: Diagnosis not present

## 2017-08-23 ENCOUNTER — Ambulatory Visit: Payer: Medicare Other | Admitting: Pulmonary Disease

## 2017-08-23 NOTE — Progress Notes (Deleted)
Subjective:    Patient ID: [name], male    DOB: 1945-05-24, 72 y.o.   MRN: [id number]  Synopsis: [name] first saw the [place] [facility] in July 2013 for a pulmonary nodule. He has a greater than 50-pack-year smoking history and quit in 2012> started again though and is still smoking as of 2018. He was found on his initial visit to have gold stage II COPD but has minimal symptoms. The nodule was 6 mm and located in the left lower lobe there is also a 6 mm pleural-based nodule seen in the right middle lobe.  11/06/2011 MMRC 0  HPI  No chief complaint on file.  ***  Past Medical History:  Diagnosis Date  . COPD (chronic obstructive pulmonary disease) ([place])   . Coronary artery disease   . History of kidney stones   . History of prostate cancer   . Hyperlipidemia   . Hypertension   . MI (myocardial infarction) ([place]) 03/2001  . Neuromuscular disorder ([place])      Review of Systems  Constitutional: Negative for chills, fatigue and fever.  HENT: Negative for congestion, rhinorrhea, sinus pressure and sneezing.   Respiratory: Negative for cough, shortness of breath and wheezing.   Cardiovascular: Negative for chest pain, palpitations and leg swelling.      Objective:   Physical Exam  There were no vitals filed for this visit.  ***   Chest imaging: 10/09/2011 CT chest [facility]: 6 mm round smooth solid pulmonary nodule in the left lower lobe 01/10/2012 CT chest [facility]: 6 mm nodule in LLL stable, stable 5.8 cm pleural based lesion RML 06/25/2012 CT Chest [facility] > 75mm nodule in LLL unchanged, scarring in lingula likely retained mucus 01/20/2013 CT chest [facility]> Stable 41mm nodule int he LLL unchanged September 2016 CT chest stable 6 mm nodule 03/2017 CXR > emphysema, no acute abnormality  PFT 11/06/2011 simple spirometry with clear airflow obstruction, FEV1 to FVC ratio 60%, FEV1 2.37 L (63% predicted)  CBC    Component Value Date/Time   WBC 10.9 (H) 12/28/2015 1524   RBC 5.08 12/28/2015 1524   HGB 15.6 12/28/2015 1524   HGB 14.9 02/27/2012 1406   HCT 45.8 12/28/2015 1524   HCT 45.2 02/27/2012 1406   PLT 182.0 12/28/2015 1524   PLT 196 02/27/2012 1406   MCV 90.2 12/28/2015 1524   MCV 93 02/27/2012 1406   MCH 30.7 02/27/2012 1406   MCH 31.4 01/03/2011 1420   MCHC 34.1 12/28/2015 1524   RDW 13.1 12/28/2015 1524   RDW 12.9 02/27/2012 1406   LYMPHSABS 1.7 12/28/2015 1524   LYMPHSABS 2.1 02/27/2012 1406   MONOABS 0.7 12/28/2015 1524   MONOABS 0.6 02/27/2012 1406   EOSABS 0.3 12/28/2015 1524   EOSABS 0.4 02/27/2012 1406   BASOSABS 0.0 12/28/2015 1524   BASOSABS 0.1 02/27/2012 1406       Assessment & Plan:   No diagnosis found.  ***   Current Outpatient Medications:  .  albuterol (PROVENTIL HFA;VENTOLIN HFA) 108 (90 Base) MCG/ACT inhaler, Inhale 2 puffs into the lungs every 6 (six) hours as needed for wheezing., Disp: 1 Inhaler, Rfl: 5 .  aspirin 81 MG tablet, Take 81 mg by mouth daily., Disp: , Rfl:  .  atorvastatin (LIPITOR) 40 MG tablet, TAKE 1 TABLET BY MOUTH EVERY DAY AT 6PM, Disp: 90 tablet, Rfl: 3 .  budesonide-formoterol (SYMBICORT) 160-4.5 MCG/ACT inhaler, Inhale 2 puffs into the lungs 2 (two) times daily., Disp: , Rfl:  .  chlorpheniramine-HYDROcodone (  TUSSIONEX PENNKINETIC ER) 10-8 MG/5ML SUER, Take 5 mLs by mouth at bedtime as needed., Disp: 140 mL, Rfl: 0 .  doxycycline (VIBRA-TABS) 100 MG tablet, Take 1 tablet (100 mg total) by mouth 2 (two) times daily., Disp: 20 tablet, Rfl: 0 .  methocarbamol (ROBAXIN) 500 MG tablet, Take 1 tablet (500 mg total) by mouth 2 (two) times daily., Disp: 180 tablet, Rfl: 3 .  omeprazole (PRILOSEC) 40 MG capsule, TAKE 1 CAPSULE BY MOUTH DAILY USUALLY 30MINUTES BEFORE BREAKFAST, Disp: 90 capsule, Rfl: 1 .  polyethylene glycol powder (GLYCOLAX/MIRALAX) powder, Take 17 g by mouth as needed. , Disp: , Rfl:  .  traMADol (ULTRAM) 50 MG tablet, Take 1 tablet (50 mg total) by mouth 4 (four) times daily as  needed., Disp: 120 tablet, Rfl: 3

## 2017-08-25 ENCOUNTER — Telehealth: Payer: Self-pay | Admitting: *Deleted

## 2017-08-25 DIAGNOSIS — Z122 Encounter for screening for malignant neoplasm of respiratory organs: Secondary | ICD-10-CM

## 2017-08-25 DIAGNOSIS — Z87891 Personal history of nicotine dependence: Secondary | ICD-10-CM

## 2017-08-25 NOTE — Telephone Encounter (Signed)
Notified patient that annual lung cancer screening low dose CT scan is due currently or will be in near future. Confirmed that patient is within the age range of 55-77, and asymptomatic, (no signs or symptoms of lung cancer). Patient denies illness that would prevent curative treatment for lung cancer if found. Verified smoking history, (current, 43 pack year). The shared decision making visit was done 08/29/16. Patient is agreeable for CT scan being scheduled 2nd week of June.

## 2017-09-25 ENCOUNTER — Ambulatory Visit
Admission: RE | Admit: 2017-09-25 | Discharge: 2017-09-25 | Disposition: A | Discharge status: Home / Self Care | Payer: Medicare Other | Source: Ambulatory Visit | Attending: Nurse Practitioner | Admitting: Nurse Practitioner

## 2017-09-25 DIAGNOSIS — Z87891 Personal history of nicotine dependence: Secondary | ICD-10-CM | POA: Insufficient documentation

## 2017-09-25 DIAGNOSIS — F17210 Nicotine dependence, cigarettes, uncomplicated: Secondary | ICD-10-CM | POA: Diagnosis not present

## 2017-09-25 DIAGNOSIS — K8020 Calculus of gallbladder without cholecystitis without obstruction: Secondary | ICD-10-CM | POA: Insufficient documentation

## 2017-09-25 DIAGNOSIS — J432 Centrilobular emphysema: Secondary | ICD-10-CM | POA: Insufficient documentation

## 2017-09-25 DIAGNOSIS — I700 Atherosclerosis of aorta: Secondary | ICD-10-CM | POA: Insufficient documentation

## 2017-09-25 DIAGNOSIS — I2510 Atherosclerotic heart disease of native coronary artery without angina pectoris: Secondary | ICD-10-CM | POA: Insufficient documentation

## 2017-09-25 DIAGNOSIS — Z122 Encounter for screening for malignant neoplasm of respiratory organs: Secondary | ICD-10-CM | POA: Insufficient documentation

## 2017-10-01 ENCOUNTER — Ambulatory Visit (INDEPENDENT_AMBULATORY_CARE_PROVIDER_SITE_OTHER): Payer: Medicare Other

## 2017-10-01 ENCOUNTER — Telehealth: Payer: Self-pay | Admitting: *Deleted

## 2017-10-01 DIAGNOSIS — I714 Abdominal aortic aneurysm, without rupture, unspecified: Secondary | ICD-10-CM

## 2017-10-01 NOTE — Telephone Encounter (Signed)
Notified patient of LDCT lung cancer screening program results with recommendation for 6 month follow up imaging. Also notified of incidental findings noted below and is encouraged to discuss further with PCP who will receive a copy of this note and/or the CT report. Patient verbalizes understanding.   IMPRESSION: 1. Lung-RADS 3S, probably benign findings. Short-term follow-up in 6 months is recommended with repeat low-dose chest CT without contrast (please use the following order, "CT CHEST LCS NODULE FOLLOW-UP W/O CM"). 2. The "S" modifier above refers to potentially clinically significant non lung cancer related findings. Specifically, there is aortic atherosclerosis, in addition to left main and 3 vessel coronary artery disease. Assessment for potential risk factor modification, dietary therapy or pharmacologic therapy may be warranted, if clinically indicated. 3. Mild diffuse bronchial wall thickening with mild centrilobular and paraseptal emphysema; imaging findings suggestive of underlying COPD. 4. Cholelithiasis.  Aortic Atherosclerosis (ICD10-I70.0) and Emphysema (ICD10-J43.9).

## 2017-10-08 ENCOUNTER — Other Ambulatory Visit: Payer: Medicare Other

## 2017-10-15 ENCOUNTER — Telehealth: Payer: Self-pay | Admitting: Cardiovascular Disease

## 2017-10-15 DIAGNOSIS — I714 Abdominal aortic aneurysm, without rupture, unspecified: Secondary | ICD-10-CM

## 2017-10-15 NOTE — Telephone Encounter (Signed)
Patient wife calling Checking on status of results for ECHO Please call

## 2017-10-16 DIAGNOSIS — M4716 Other spondylosis with myelopathy, lumbar region: Secondary | ICD-10-CM | POA: Diagnosis not present

## 2017-10-17 NOTE — Telephone Encounter (Signed)
Spoke with patient and reviewed that he had a AAA done but that I do not see a echocardiogram in the system. Reviewed results of that study and recommendations to establish with [facility]. He verbalized understanding of results, agreement with plan, and had no further questions at this time. Will put in referral and advised that I would reach out to them to assist with scheduling. He had no further questions at this time.

## 2017-10-19 NOTE — Telephone Encounter (Signed)
Spoke with Tammy over at [facility] and she confirmed receipt of referral and will reach out to schedule with him.

## 2017-10-19 NOTE — Telephone Encounter (Signed)
Called [facility] and left voicemail message for them to call back regarding referral and appointment for patient.

## 2017-10-25 ENCOUNTER — Ambulatory Visit (INDEPENDENT_AMBULATORY_CARE_PROVIDER_SITE_OTHER): Payer: Medicare Other | Admitting: Pulmonary Disease

## 2017-10-25 ENCOUNTER — Encounter: Payer: Self-pay | Admitting: Pulmonary Disease

## 2017-10-25 VITALS — BP 156/80 | HR 68 | Ht 72.0 in | Wt 203.0 lb

## 2017-10-25 DIAGNOSIS — Z720 Tobacco use: Secondary | ICD-10-CM | POA: Diagnosis not present

## 2017-10-25 DIAGNOSIS — J432 Centrilobular emphysema: Secondary | ICD-10-CM | POA: Diagnosis not present

## 2017-10-25 MED ORDER — UMECLIDINIUM-VILANTEROL 62.5-25 MCG/INH IN AEPB: 1.0000 | INHALATION_SPRAY | Freq: Every day | RESPIRATORY_TRACT | 0 refills | Status: DC

## 2017-10-25 MED ORDER — UMECLIDINIUM-VILANTEROL 62.5-25 MCG/INH IN AEPB: 1.0000 | INHALATION_SPRAY | Freq: Every day | RESPIRATORY_TRACT | 0 refills | Status: AC

## 2017-10-25 NOTE — Progress Notes (Signed)
Subjective:    Patient ID: [name], male    DOB: [birth year], 72 y.o.   MRN: [id number]  Synopsis: [name] first saw the [place] [facility] in July 2013 for a pulmonary nodule. He has a greater than 50-pack-year smoking history and quit in 2012> started again though and is still smoking as of 2018. He was found on his initial visit to have gold stage II COPD but has minimal symptoms. The nodule was 6 mm and located in the left lower lobe there is also a 6 mm pleural-based nodule seen in the right middle lobe.  11/06/2011 MMRC 0  HPI  Chief Complaint  Patient presents with  . Follow-up   He is still going outside cutting the grass and feels a little limited doing it.  However the heat is the main problem there.  He is still smoking.  He says that his breathing has been doing fairly well recently.  Some wheezing but not too much mucus production.  He has not been sick with bronchitis or pneumonia.  He is compliant with his Symbicort which he takes 2 puffs twice a day but he is interested in considering Anoro because he has learned from his insurance company that this is less expensive.  Past Medical History:  Diagnosis Date  . COPD (chronic obstructive pulmonary disease) ([place])   . Coronary artery disease   . History of kidney stones   . History of prostate cancer   . Hyperlipidemia   . Hypertension   . MI (myocardial infarction) ([place]) 03/2001  . Neuromuscular disorder ([place])      Review of Systems  Constitutional: Negative for chills, fatigue and fever.  HENT: Negative for congestion, rhinorrhea, sinus pressure and sneezing.   Respiratory: Negative for cough, shortness of breath and wheezing.   Cardiovascular: Negative for chest pain, palpitations and leg swelling.      Objective:   Physical Exam  Vitals:   10/25/17 1145  BP: (!) 156/80  Pulse: 68  SpO2: 91%  Weight: 203 lb (92.1 kg)  Height: 6' (1.829 m)    Gen: well appearing HENT: OP clear, TM's  clear, neck supple PULM: CTA B, normal percussion CV: RRR, no mgr, trace edema GI: BS+, soft, nontender Derm: no cyanosis or rash Psyche: normal mood and affect   Chest imaging: 10/09/2011 CT chest [facility]: 6 mm round smooth solid pulmonary nodule in the left lower lobe 01/10/2012 CT chest [facility]: 6 mm nodule in LLL stable, stable 5.8 mm pleural based lesion RML 06/25/2012 CT Chest [facility] > 38mm nodule in LLL unchanged, scarring in lingula likely retained mucus 01/20/2013 CT chest [facility]> Stable 94mm nodule int he LLL unchanged September 2016 CT chest stable 6 mm nodule 03/2017 CXR > emphysema, no acute abnormality June 2019 CT chest images independently reviewed and reviewed with the patient showing a 5.8 mm nodule in the right middle lobe  PFT 11/06/2011 simple spirometry with clear airflow obstruction, FEV1 to FVC ratio 60%, FEV1 2.37 L (63% predicted)  CBC    Component Value Date/Time   WBC 10.9 (H) 12/28/2015 1524   RBC 5.08 12/28/2015 1524   HGB 15.6 12/28/2015 1524   HGB 14.9 02/27/2012 1406   HCT 45.8 12/28/2015 1524   HCT 45.2 02/27/2012 1406   PLT 182.0 12/28/2015 1524   PLT 196 02/27/2012 1406   MCV 90.2 12/28/2015 1524   MCV 93 02/27/2012 1406   MCH 30.7 02/27/2012 1406   MCH 31.4 01/03/2011 1420   MCHC  34.1 12/28/2015 1524   RDW 13.1 12/28/2015 1524   RDW 12.9 02/27/2012 1406   LYMPHSABS 1.7 12/28/2015 1524   LYMPHSABS 2.1 02/27/2012 1406   MONOABS 0.7 12/28/2015 1524   MONOABS 0.6 02/27/2012 1406   EOSABS 0.3 12/28/2015 1524   EOSABS 0.4 02/27/2012 1406   BASOSABS 0.0 12/28/2015 1524   BASOSABS 0.1 02/27/2012 1406       Assessment & Plan:   Centrilobular emphysema (HCC)  Tobacco abuse  Discussion: This has been a stable interval for [facility].  He has a pulmonary nodule which was seen in the right lung on the recent CT scan.  I think this is likely the same one that was seen years ago.  He is going to undergo another CT in 6 months which is reasonable.  He is  interested in changing to [facility] for cost reasons.  Plan: Cigarette smoker: Quit smoking Continue follow-up with the lung cancer screening program  COPD: Try changing from Symbicort to Anoro, samples given today Call us if you think this sample is helpful and we can change her prescription Get a flu shot in the fall Stay active Practice good hand hygiene  We will see you back in 6 months or sooner if needed  Current Outpatient Medications:  .  albuterol (PROVENTIL HFA;VENTOLIN HFA) 108 (90 Base) MCG/ACT inhaler, Inhale 2 puffs into the lungs every 6 (six) hours as needed for wheezing., Disp: 1 Inhaler, Rfl: 5 .  aspirin 81 MG tablet, Take 81 mg by mouth daily., Disp: , Rfl:  .  atorvastatin (LIPITOR) 40 MG tablet, TAKE 1 TABLET BY MOUTH EVERY DAY AT 6PM, Disp: 90 tablet, Rfl: 3 .  budesonide-formoterol (SYMBICORT) 160-4.5 MCG/ACT inhaler, Inhale 2 puffs into the lungs 2 (two) times daily., Disp: , Rfl:  .  chlorpheniramine-HYDROcodone (TUSSIONEX PENNKINETIC ER) 10-8 MG/5ML SUER, Take 5 mLs by mouth at bedtime as needed., Disp: 140 mL, Rfl: 0 .  doxycycline (VIBRA-TABS) 100 MG tablet, Take 1 tablet (100 mg total) by mouth 2 (two) times daily., Disp: 20 tablet, Rfl: 0 .  methocarbamol (ROBAXIN) 500 MG tablet, Take 1 tablet (500 mg total) by mouth 2 (two) times daily., Disp: 180 tablet, Rfl: 3 .  omeprazole (PRILOSEC) 40 MG capsule, TAKE 1 CAPSULE BY MOUTH DAILY USUALLY 30MINUTES BEFORE BREAKFAST, Disp: 90 capsule, Rfl: 1 .  polyethylene glycol powder (GLYCOLAX/MIRALAX) powder, Take 17 g by mouth as needed. , Disp: , Rfl:  .  traMADol (ULTRAM) 50 MG tablet, Take 1 tablet (50 mg total) by mouth 4 (four) times daily as needed., Disp: 120 tablet, Rfl: 3 .  umeclidinium-vilanterol (ANORO ELLIPTA) 62.5-25 MCG/INH AEPB, Inhale 1 puff into the lungs daily for 1 day., Disp: 14 each, Rfl: 0

## 2017-10-25 NOTE — Patient Instructions (Signed)
Cigarette smoker: Quit smoking Continue follow-up with the lung cancer screening program  COPD: Try changing from Symbicort to Anoro, samples given today Call us if you think this sample is helpful and we can change her prescription Get a flu shot in the fall Stay active Practice good hand hygiene  We will see you back in 6 months or sooner if needed

## 2017-10-30 ENCOUNTER — Encounter (INDEPENDENT_AMBULATORY_CARE_PROVIDER_SITE_OTHER): Payer: Self-pay | Admitting: Vascular Surgery

## 2017-10-30 ENCOUNTER — Ambulatory Visit (INDEPENDENT_AMBULATORY_CARE_PROVIDER_SITE_OTHER): Payer: Medicare Other | Admitting: Vascular Surgery

## 2017-10-30 ENCOUNTER — Ambulatory Visit
Admission: RE | Admit: 2017-10-30 | Discharge: 2017-10-30 | Disposition: A | Discharge status: Home / Self Care | Payer: Medicare Other | Source: Ambulatory Visit | Attending: Vascular Surgery | Admitting: Vascular Surgery

## 2017-10-30 ENCOUNTER — Telehealth (INDEPENDENT_AMBULATORY_CARE_PROVIDER_SITE_OTHER): Payer: Self-pay | Admitting: Vascular Surgery

## 2017-10-30 VITALS — BP 172/90 | HR 73 | Resp 17 | Ht 71.0 in | Wt 204.0 lb

## 2017-10-30 DIAGNOSIS — K8020 Calculus of gallbladder without cholecystitis without obstruction: Secondary | ICD-10-CM | POA: Insufficient documentation

## 2017-10-30 DIAGNOSIS — I25118 Atherosclerotic heart disease of native coronary artery with other forms of angina pectoris: Secondary | ICD-10-CM | POA: Diagnosis not present

## 2017-10-30 DIAGNOSIS — I714 Abdominal aortic aneurysm, without rupture, unspecified: Secondary | ICD-10-CM

## 2017-10-30 DIAGNOSIS — K4020 Bilateral inguinal hernia, without obstruction or gangrene, not specified as recurrent: Secondary | ICD-10-CM | POA: Diagnosis not present

## 2017-10-30 DIAGNOSIS — I701 Atherosclerosis of renal artery: Secondary | ICD-10-CM | POA: Diagnosis not present

## 2017-10-30 DIAGNOSIS — I713 Abdominal aortic aneurysm, ruptured, unspecified: Secondary | ICD-10-CM

## 2017-10-30 DIAGNOSIS — E782 Mixed hyperlipidemia: Secondary | ICD-10-CM | POA: Diagnosis not present

## 2017-10-30 DIAGNOSIS — K429 Umbilical hernia without obstruction or gangrene: Secondary | ICD-10-CM | POA: Diagnosis not present

## 2017-10-30 DIAGNOSIS — N200 Calculus of kidney: Secondary | ICD-10-CM | POA: Insufficient documentation

## 2017-10-30 DIAGNOSIS — I10 Essential (primary) hypertension: Secondary | ICD-10-CM

## 2017-10-30 LAB — POCT I-STAT CREATININE: CREATININE: 1 mg/dL (ref 0.61–1.24)

## 2017-10-30 MED ORDER — IOPAMIDOL (ISOVUE-370) INJECTION 76%
100.0000 mL | Freq: Once | INTRAVENOUS | Status: AC | PRN
  Administered 2017-10-30: 100 mL via INTRAVENOUS

## 2017-10-30 NOTE — Patient Instructions (Signed)
Abdominal Aortic Aneurysm Blood pumps away from the heart through tubes (blood vessels) called arteries. Aneurysms are weak or damaged places in the wall of an artery. It bulges out like a balloon. An abdominal aortic aneurysm happens in the main artery of the body (aorta). It can burst or tear, causing bleeding inside the body. This is an emergency. It needs treatment right away. What are the causes? The exact cause is unknown. Things that could cause this problem include:  Fat and other substances building up in the lining of a tube.  Swelling of the walls of a blood vessel.  Certain tissue diseases.  Belly (abdominal) trauma.  An infection in the main artery of the body.  What increases the risk? There are things that make it more likely for you to have an aneurysm. These include:  Being over the age of 74 years old.  Having high blood pressure (hypertension).  Being a male.  Being white.  Being very overweight (obese).  Having a family history of aneurysm.  Using tobacco products.  What are the signs or symptoms? Symptoms depend on the size of the aneurysm and how fast it grows. There may not be symptoms. If symptoms occur, they can include:  Pain (belly, side, lower back, or groin).  Feeling full after eating a small amount of food.  Feeling sick to your stomach (nauseous), throwing up (vomiting), or both.  Feeling a lump in your belly that feels like it is beating (pulsating).  Feeling like you will pass out (faint).  How is this treated?  Medicine to control blood pressure and pain.  Imaging tests to see if the aneurysm gets bigger.  Surgery. How is this prevented? To lessen your chance of getting this condition:  Stop smoking. Stop chewing tobacco.  Limit or avoid alcohol.  Keep your blood pressure, blood sugar, and cholesterol within normal limits.  Eat less salt.  Eat foods low in saturated fats and cholesterol. These are found in animal and  whole dairy products.  Eat more fiber. Fiber is found in whole grains, vegetables, and fruits.  Keep a healthy weight.  Stay active and exercise often.  This information is not intended to replace advice given to you by your health care provider. Make sure you discuss any questions you have with your health care provider. Document Released: 07/29/2012 Document Revised: 09/09/2015 Document Reviewed: 05/03/2012 Elsevier Interactive Patient Education  2017 Elsevier Inc.  

## 2017-10-30 NOTE — Assessment & Plan Note (Signed)
blood pressure control important in reducing the progression of atherosclerotic disease. On appropriate oral medications.  

## 2017-10-30 NOTE — Telephone Encounter (Signed)
Patient has had his CT.

## 2017-10-30 NOTE — Assessment & Plan Note (Signed)
Follows with cardiology 

## 2017-10-30 NOTE — Telephone Encounter (Signed)
centralized scheduling said this patient will need an order for labs to get his ct done that dew just put in today to be done today.

## 2017-10-30 NOTE — Assessment & Plan Note (Signed)
lipid control important in reducing the progression of atherosclerotic disease. Continue statin therapy  

## 2017-10-30 NOTE — Progress Notes (Signed)
Patient ID: [name], male   DOB: 23-Dec-1945, 72 y.o.   MRN: [id number]  Chief Complaint  Patient presents with  . New Patient (Initial Visit)    ref Roland for AAA    HPI [name] is a 72 y.o. male.  I am asked to see the patient by Dr. Rockey Situ for evaluation of AAA.  The patient reports having this aneurysm for some time and they have been following it at his cardiologist's office.  Apparently, it was about 4.2 cm last year but his most recent duplex shows significant growth now measuring 4.88 cm in maximal diameter.  He has a large abdominal hernia.  He has not had any new back pain but does have some chronic abdominal discomfort from his hernia.  No signs of peripheral embolization.  Is a smoker and does have blood pressure issues.  No family history of aneurysms.   Past Medical History:  Diagnosis Date  . COPD (chronic obstructive pulmonary disease) ([place])   . Coronary artery disease   . History of kidney stones   . History of prostate cancer   . Hyperlipidemia   . Hypertension   . MI (myocardial infarction) ([place]) 03/2001  . Neuromuscular disorder [facility])     Past Surgical History:  Procedure Laterality Date  . BACK SURGERY  2009/2010   x 2  . CARDIAC CATHETERIZATION  2002   stent placement [place]  . FOOT SURGERY     bilateral   . HERNIA REPAIR     bilateral   . KNEE SURGERY     right knee   . PROSTATE SURGERY  09/2009   prostate implant  . SPINE SURGERY      Family History  Problem Relation Age of Onset  . Heart attack Mother   . Diabetes Mother   . Heart disease Father   no bleeding or clotting disorders No aneurysms  Social History Social History   Tobacco Use  . Smoking status: Current Every Day Smoker    Packs/day: 1.00    Years: 56.00    Pack years: 56.00    Types: Cigarettes  . Smokeless tobacco: Never Used  . Tobacco comment: back to smoking X1 year  Substance Use Topics  . Alcohol use: No    Alcohol/week: 0.0 oz  . Drug use:  No  Married   Allergies  Allergen Reactions  . Lyrica [Pregabalin]     Swelling   . Prednisone     Feels like having a heart attack. Has had the injection form before and does well.    Current Outpatient Medications  Medication Sig Dispense Refill  . albuterol (PROVENTIL HFA;VENTOLIN HFA) 108 (90 Base) MCG/ACT inhaler Inhale 2 puffs into the lungs every 6 (six) hours as needed for wheezing. 1 Inhaler 5  . aspirin 81 MG tablet Take 81 mg by mouth daily.    [name] atorvastatin (LIPITOR) 40 MG tablet TAKE 1 TABLET BY MOUTH EVERY DAY AT 6PM 90 tablet 3  . budesonide-formoterol (SYMBICORT) 160-4.5 MCG/ACT inhaler Inhale 2 puffs into the lungs 2 (two) times daily.    . chlorpheniramine-HYDROcodone (TUSSIONEX PENNKINETIC ER) 10-8 MG/5ML SUER Take 5 mLs by mouth at bedtime as needed. 140 mL 0  . doxycycline (VIBRA-TABS) 100 MG tablet Take 1 tablet (100 mg total) by mouth 2 (two) times daily. 20 tablet 0  . methocarbamol (ROBAXIN) 500 MG tablet Take 1 tablet (500 mg total) by mouth 2 (two) times daily. 180 tablet 3  .  omeprazole (PRILOSEC) 40 MG capsule TAKE 1 CAPSULE BY MOUTH DAILY USUALLY 30MINUTES BEFORE BREAKFAST 90 capsule 1  . polyethylene glycol powder (GLYCOLAX/MIRALAX) powder Take 17 g by mouth as needed.     . traMADol (ULTRAM) 50 MG tablet Take 1 tablet (50 mg total) by mouth 4 (four) times daily as needed. 120 tablet 3   No current facility-administered medications for this visit.       REVIEW OF SYSTEMS (Negative unless checked)  Constitutional: [] Weight loss  [] Fever  [] Chills Cardiac: [] Chest pain   [] Chest pressure   [x] Palpitations   [] Shortness of breath when laying flat   [] Shortness of breath at rest   [x] Shortness of breath with exertion. Vascular:  [] Pain in legs with walking   [] Pain in legs at rest   [] Pain in legs when laying flat   [] Claudication   [] Pain in feet when walking  [] Pain in feet at rest  [] Pain in feet when laying flat   [] History of DVT   [] Phlebitis    [] Swelling in legs   [] Varicose veins   [] Non-healing ulcers Pulmonary:   [] Uses home oxygen   [] Productive cough   [] Hemoptysis   [] Wheeze  [x] COPD   [] Asthma Neurologic:  [] Dizziness  [] Blackouts   [] Seizures   [] History of stroke   [] History of TIA  [] Aphasia   [] Temporary blindness   [] Dysphagia   [] Weakness or numbness in arms   [] Weakness or numbness in legs Musculoskeletal:  [x] Arthritis   [] Joint swelling   [x] Joint pain   [x] Low back pain Hematologic:  [] Easy bruising  [] Easy bleeding   [] Hypercoagulable state   [] Anemic  [] Hepatitis Gastrointestinal:  [] Blood in stool   [] Vomiting blood  [x] Gastroesophageal reflux/heartburn   [] Abdominal pain Genitourinary:  [] Chronic kidney disease   [] Difficult urination  [] Frequent urination  [] Burning with urination   [] Hematuria Skin:  [] Rashes   [] Ulcers   [] Wounds Psychological:  [] History of anxiety   []  History of major depression.    Physical Exam BP (!) 172/90 (BP Location: Right Arm)   Pulse 73   Resp 17   Ht 5\' 11"  (1.803 m)   Wt 204 lb (92.5 kg)   BMI 28.45 kg/m  Gen:  WD/WN, NAD Head: [place]/AT, No temporalis wasting.  Ear/Nose/Throat: Hearing grossly intact, nares w/o erythema or drainage, oropharynx w/o Erythema/Exudate Eyes: Conjunctiva clear, sclera non-icteric  Neck: trachea midline.  No bruit.  Pulmonary:  Good air movement, clear to auscultation bilaterally.  Cardiac: RRR, no JVD Vascular:  Vessel Right Left  Radial Palpable Palpable                          PT  1+ palpable  1+ palpable  DP  1+ palpable Palpable   Gastrointestinal: soft, non-tender/non-distended.  Large abdominal hernia is present.  Aorta is not easily palpable. Musculoskeletal: M/S 5/5 throughout.  Extremities without ischemic changes.  No deformity or atrophy.  Neurologic: Sensation grossly intact in extremities.  Symmetrical.  Speech is fluent. Motor exam as listed above. Psychiatric: Judgment intact, Mood & affect appropriate for pt's clinical  situation. Dermatologic: No rashes or ulcers noted.  No cellulitis or open wounds.    Radiology No results found.  Labs No results found for this or any previous visit (from the past 2160 hour(s)).  Assessment/Plan:  Essential hypertension blood pressure control important in reducing the progression of atherosclerotic disease. On appropriate oral medications.   CAD, NATIVE VESSEL Follows with cardiology  Hyperlipidemia lipid  control important in reducing the progression of atherosclerotic disease. Continue statin therapy   AAA (abdominal aortic aneurysm) without rupture (HCC) Recommend: The patient has an abdominal aortic aneurysm that is growing and approaching 5.0 cm by duplex scan and based on this study it appears that it is suitable for endovascular treatment.  The patient is otherwise in reasonable health.   Therefore, the patient should undergo endovascular repair of the AAA to prevent future leathal rupture.   Patient will require CT angiography of the abdomen and pelvis in order to appropriately plan repair of the AAA.  The risks and benefits as well as the alternative therapies was discussed in detail with the patient. All questions were answered. The patient agrees to move forward the AAA repair and therefore with CT scan.  The patient will follow up with me in the office after the CT scan to review the study.      Leotis Pain 10/30/2017, 11:27 AM   This note was created with Dragon medical transcription system.  Any errors from dictation are unintentional.

## 2017-10-30 NOTE — Assessment & Plan Note (Signed)
Recommend: The patient has an abdominal aortic aneurysm that is growing and approaching 5.0 cm by duplex scan and based on this study it appears that it is suitable for endovascular treatment.  The patient is otherwise in reasonable health.   Therefore, the patient should undergo endovascular repair of the AAA to prevent future leathal rupture.   Patient will require CT angiography of the abdomen and pelvis in order to appropriately plan repair of the AAA.  The risks and benefits as well as the alternative therapies was discussed in detail with the patient. All questions were answered. The patient agrees to move forward the AAA repair and therefore with CT scan.  The patient will follow up with me in the office after the CT scan to review the study.

## 2017-11-26 DIAGNOSIS — M1711 Unilateral primary osteoarthritis, right knee: Secondary | ICD-10-CM | POA: Diagnosis not present

## 2017-11-26 DIAGNOSIS — M5416 Radiculopathy, lumbar region: Secondary | ICD-10-CM | POA: Diagnosis not present

## 2017-11-26 DIAGNOSIS — M25561 Pain in right knee: Secondary | ICD-10-CM | POA: Diagnosis not present

## 2017-11-27 ENCOUNTER — Telehealth: Payer: Self-pay | Admitting: Cardiovascular Disease

## 2017-11-27 NOTE — Telephone Encounter (Signed)
New Message  Pts wife verbalized Dr. Bunnie Domino office sent over cardiac clearance two weeks ago and have not heard back so the wife is calling to expedite the cardiac clearance.  Please f/u with pts wife.

## 2017-11-27 NOTE — Telephone Encounter (Signed)
Spoke with patients wife and reviewed that we have not received cardiac clearance from other providers office. Advised that I would send them a message to please send that over to [place] and that she should please call me back if she doesn't hear anything back by Thursday. She verbalized understanding with no further questions at this time.

## 2017-11-28 ENCOUNTER — Telehealth: Payer: Self-pay | Admitting: Cardiovascular Disease

## 2017-11-28 NOTE — Telephone Encounter (Signed)
Can you please call patient to see if they can come in on 12/14/17 to see Dr Rockey Situ? There looks like there is an opening.

## 2017-11-28 NOTE — Telephone Encounter (Signed)
   [facility] Pre-operative Risk Assessment    Request for surgical clearance:  1. What type of surgery is being performed? Endo AAA repair  2. When is this surgery scheduled? 12/19/2017  3. What type of clearance is required (medical clearance vs. Pharmacy clearance to hold med vs. Both)? Not listed 4. Are there any medications that need to be held prior to surgery and how long? Not listed  5. Practice name and name of physician performing surgery? [facility]  6. What is your office phone number [phone]   7.   What is your office fax number [phone]  8.   Anesthesia type (None, local, MAC, general) ? Not listed   [name] 11/28/2017, 10:53 AM  _________________________________________________________________   (provider comments below)

## 2017-11-28 NOTE — Telephone Encounter (Signed)
Olin Hauser, The request was faxed over on 11/14/17 to your office. I will refax the request again today.

## 2017-11-28 NOTE — Telephone Encounter (Signed)
LMOV to schedule sooner appointment for clearance Placed hold for 8/30 with Dr. Rockey Situ

## 2017-11-29 NOTE — Telephone Encounter (Signed)
Olin Hauser,   Thank you for your help.

## 2017-11-29 NOTE — Telephone Encounter (Signed)
Patient has scheduled appointment to come in and see provider for clearance.

## 2017-11-29 NOTE — Telephone Encounter (Signed)
Patient wife called and scheduled for 8/30

## 2017-12-14 ENCOUNTER — Telehealth (INDEPENDENT_AMBULATORY_CARE_PROVIDER_SITE_OTHER): Payer: Self-pay | Admitting: Vascular Surgery

## 2017-12-14 ENCOUNTER — Ambulatory Visit: Payer: Medicare Other | Admitting: Cardiovascular Disease

## 2017-12-14 NOTE — Telephone Encounter (Signed)
I spoke with he patient wife and inform her that her husband will need to have clearance after that everything will be scheduled.The wife informed that patient did have an appointment for today but had to be reschedule to 12/18/17 with [facility] office.

## 2017-12-18 ENCOUNTER — Ambulatory Visit: Payer: Medicare Other | Admitting: Cardiovascular Disease

## 2017-12-18 ENCOUNTER — Ambulatory Visit: Payer: Medicare Other | Admitting: Physician Assistant

## 2017-12-19 ENCOUNTER — Encounter (INDEPENDENT_AMBULATORY_CARE_PROVIDER_SITE_OTHER): Payer: Self-pay

## 2017-12-20 ENCOUNTER — Encounter: Payer: Self-pay | Admitting: Physician Assistant

## 2017-12-20 ENCOUNTER — Ambulatory Visit (INDEPENDENT_AMBULATORY_CARE_PROVIDER_SITE_OTHER): Payer: Medicare Other | Admitting: Physician Assistant

## 2017-12-20 VITALS — BP 148/72 | HR 66 | Ht 72.0 in | Wt 198.0 lb

## 2017-12-20 DIAGNOSIS — I2510 Atherosclerotic heart disease of native coronary artery without angina pectoris: Secondary | ICD-10-CM

## 2017-12-20 DIAGNOSIS — Z01810 Encounter for preprocedural cardiovascular examination: Secondary | ICD-10-CM

## 2017-12-20 DIAGNOSIS — Z720 Tobacco use: Secondary | ICD-10-CM

## 2017-12-20 DIAGNOSIS — J432 Centrilobular emphysema: Secondary | ICD-10-CM

## 2017-12-20 DIAGNOSIS — I10 Essential (primary) hypertension: Secondary | ICD-10-CM | POA: Diagnosis not present

## 2017-12-20 DIAGNOSIS — I714 Abdominal aortic aneurysm, without rupture, unspecified: Secondary | ICD-10-CM

## 2017-12-20 DIAGNOSIS — I429 Cardiomyopathy, unspecified: Secondary | ICD-10-CM | POA: Diagnosis not present

## 2017-12-20 DIAGNOSIS — R9431 Abnormal electrocardiogram [ECG] [EKG]: Secondary | ICD-10-CM | POA: Diagnosis not present

## 2017-12-20 DIAGNOSIS — E782 Mixed hyperlipidemia: Secondary | ICD-10-CM | POA: Diagnosis not present

## 2017-12-20 NOTE — Patient Instructions (Signed)
Medication Instructions:  Your physician recommends that you continue on your current medications as directed. Please refer to the Current Medication list given to you today.   Labwork: none  Testing/Procedures: Your physician has requested that you have an echocardiogram. Echocardiography is a painless test that uses sound waves to create images of your heart. It provides your doctor with information about the size and shape of your heart and how well your heart's chambers and valves are working. This procedure takes approximately one hour. There are no restrictions for this procedure. You may get an IV, if needed, to receive an ultrasound enhancing agent through to better visualize your heart.   Your physician has requested that you have a lexiscan myoview. For further information please visit [email or web address]. Please follow instruction sheet, as given.  [place]  Your caregiver has ordered a Stress Test with nuclear imaging. The purpose of this test is to evaluate the blood supply to your heart muscle. This procedure is referred to as a "Non-Invasive Stress Test." This is because other than having an IV started in your vein, nothing is inserted or "invades" your body. Cardiac stress tests are done to find areas of poor blood flow to the heart by determining the extent of coronary artery disease (CAD). Some patients exercise on a treadmill, which naturally increases the blood flow to your heart, while others who are  unable to walk on a treadmill due to physical limitations have a pharmacologic/chemical stress agent called Lexiscan . This medicine will mimic walking on a treadmill by temporarily increasing your coronary blood flow.   Please note: these test may take anywhere between 2-4 hours to complete  PLEASE REPORT TO [facility] AT THE FIRST DESK WILL DIRECT YOU WHERE TO GO  Date of Procedure:_____________________________________  Arrival Time for  Procedure:______________________________    PLEASE NOTIFY THE OFFICE AT LEAST 24 HOURS IN ADVANCE IF YOU ARE UNABLE TO KEEP YOUR APPOINTMENT.  [phone] AND  PLEASE NOTIFY NUCLEAR MEDICINE AT [facility] AT LEAST 24 HOURS IN ADVANCE IF YOU ARE UNABLE TO KEEP YOUR APPOINTMENT. [phone]  How to prepare for your Myoview test:  1. Do not eat or drink after midnight 2. No caffeine for 24 hours prior to test 3. No smoking 24 hours prior to test. 4. Your medication may be taken with water.  If your doctor stopped a medication because of this test, do not take that medication. 5. Pants are appropriate. Please wear a short sleeve shirt. 6. No perfume, cologne or lotion. 7. Wear comfortable walking shoes.   Follow-Up: Your physician recommends that you schedule a follow-up appointment in: [place] [facility].  If you need a refill on your cardiac medications before your next appointment, please call your pharmacy.    Echocardiogram An echocardiogram, or echocardiography, uses sound waves (ultrasound) to produce an image of your heart. The echocardiogram is simple, painless, obtained within a short period of time, and offers valuable information to your health care provider. The images from an echocardiogram can provide information such as:  Evidence of coronary artery disease (CAD).  Heart size.  Heart muscle function.  Heart valve function.  Aneurysm detection.  Evidence of a past heart attack.  Fluid buildup around the heart.  Heart muscle thickening.  Assess heart valve function.  Tell a health care provider about:  Any allergies you have.  All medicines you are taking, including vitamins, herbs, eye drops, creams, and over-the-counter medicines.  Any problems you or family members have had with anesthetic medicines.  Any blood disorders you have.  Any surgeries you have had.  Any medical conditions you have.  Whether you are pregnant or may be  pregnant. What happens before the procedure? No special preparation is needed. Eat and drink normally. What happens during the procedure?  In order to produce an image of your heart, gel will be applied to your chest and a wand-like tool (transducer) will be moved over your chest. The gel will help transmit the sound waves from the transducer. The sound waves will harmlessly bounce off your heart to allow the heart images to be captured in real-time motion. These images will then be recorded.  You may need an IV to receive a medicine that improves the quality of the pictures. What happens after the procedure? You may return to your normal schedule including diet, activities, and medicines, unless your health care provider tells you otherwise. This information is not intended to replace advice given to you by your health care provider. Make sure you discuss any questions you have with your health care provider. Document Released: 03/31/2000 Document Revised: 11/20/2015 Document Reviewed: 12/09/2012 Elsevier Interactive Patient Education  2017 [place].     Cardiac Nuclear Scan A cardiac nuclear scan is a test that measures blood flow to the heart when a person is resting and when he or she is exercising. The test looks for problems such as:  Not enough blood reaching a portion of the heart.  The heart muscle not working normally.  You may need this test if:  You have heart disease.  You have had abnormal lab results.  You have had heart surgery or angioplasty.  You have chest pain.  You have shortness of breath.  In this test, a radioactive dye (tracer) is injected into your bloodstream. After the tracer has traveled to your heart, an imaging device is used to measure how much of the tracer is absorbed by or distributed to various areas of your heart. This procedure is usually done at a hospital and takes 2-4 hours. Tell a health care provider about:  Any allergies you  have.  All medicines you are taking, including vitamins, herbs, eye drops, creams, and over-the-counter medicines.  Any problems you or family members have had with the use of anesthetic medicines.  Any blood disorders you have.  Any surgeries you have had.  Any medical conditions you have.  Whether you are pregnant or may be pregnant. What are the risks? Generally, this is a safe procedure. However, problems may occur, including:  Serious chest pain and heart attack. This is only a risk if the stress portion of the test is done.  Rapid heartbeat.  Sensation of warmth in your chest. This usually passes quickly.  What happens before the procedure?  Ask your health care provider about changing or stopping your regular medicines. This is especially important if you are taking diabetes medicines or blood thinners.  Remove your jewelry on the day of the procedure. What happens during the procedure?  An IV tube will be inserted into one of your veins.  Your health care provider will inject a small amount of radioactive tracer through the tube.  You will wait for 20-40 minutes while the tracer travels through your bloodstream.  Your heart activity will be monitored with an electrocardiogram (ECG).  You will lie down on an exam table.  Images of your heart will be taken for about  15-20 minutes.  You may be asked to exercise on a treadmill or stationary bike. While you exercise, your heart's activity will be monitored with an ECG, and your blood pressure will be checked. If you are unable to exercise, you may be given a medicine to increase blood flow to parts of your heart.  When blood flow to your heart has peaked, a tracer will again be injected through the IV tube.  After 20-40 minutes, you will get back on the exam table and have more images taken of your heart.  When the procedure is over, your IV tube will be removed. The procedure may vary among health care providers  and hospitals. Depending on the type of tracer used, scans may need to be repeated 3-4 hours later. What happens after the procedure?  Unless your health care provider tells you otherwise, you may return to your normal schedule, including diet, activities, and medicines.  Unless your health care provider tells you otherwise, you may increase your fluid intake. This will help flush the contrast dye from your body. Drink enough fluid to keep your urine clear or pale yellow.  It is up to you to get your test results. Ask your health care provider, or the department that is doing the test, when your results will be ready. Summary  A cardiac nuclear scan measures the blood flow to the heart when a person is resting and when he or she is exercising.  You may need this test if you are at risk for heart disease.  Tell your health care provider if you are pregnant.  Unless your health care provider tells you otherwise, increase your fluid intake. This will help flush the contrast dye from your body. Drink enough fluid to keep your urine clear or pale yellow. This information is not intended to replace advice given to you by your health care provider. Make sure you discuss any questions you have with your health care provider. Document Released: 04/28/2004 Document Revised: 04/05/2016 Document Reviewed: 03/12/2013 Elsevier Interactive Patient Education  2017 [employer].

## 2017-12-20 NOTE — Progress Notes (Signed)
Cardiology Office Note Date:  12/20/2017  Patient ID:  [name], [name] April 26, 1945, MRN [id number] PCP:  Crecencio Mc, MD  Cardiologist:  Dr. Rockey Situ, MD    Chief Complaint: Preoperative cardiac evaluation  History of Present Illness: [name] is a 72 y.o. male with history of CAD with history of posterior MI in 2002 status post BMS to the left circumflex status post repeat PCI/BMS in 2012 in the setting of abnormal Myoview as detailed below, possible cardiomyopathy, ongoing tobacco abuse, AAA as detailed below, PVD, hypertension, hyperlipidemia, COPD, prostate cancer status post seed implantation, and nephrolithiasis who presents for preoperative cardiac evaluation.  Patient underwent Myoview in 2010 that was negative for ischemia with an EF of 60%.  Abnormal EKG was noted in the office in 2012 with follow-up nuclear stress testing showing anterior wall ischemia with an estimated EF of 26%.  This was a new wall motion abnormality as well as a newly reduced EF.  Follow-up cardiac catheterization on 01/05/2011 showed 95% stenosis of the proximal LAD, 95% stenosis of the mid LAD, 30% in-stent restenosis of the mid left circumflex with a second lesion of diffuse 50% stenosis.  The patient underwent successful PCI/BMS to the mid LAD with 0% residual stenosis.  It does not appear LV gram was performed.  Patient has not undergone any further ischemic evaluation to further elucidate his possible cardiomyopathy.  Patient was last seen in the office and 12/2016 and was doing reasonably well from a cardiac perspective at that time.  He has previously been monitored from a AAA standpoint with a widest dimension of 3.7 cm in 2013.  Follow-up imaging in 08/2016 demonstrated a 4.5 cm AAA.  He was referred to vascular surgery.  Patient saw vascular surgery in 10/2017 for initial evaluation of his AAA.  It was noted at that time the patient's AAA had progressed to 4.88 cm in maximal diameter.  Recommendation  was made to proceed with endovascular repair at that time.  Preoperative CT abdomen and pelvis demonstrated an infrarenal abdominal aortic aneurysm that measured 4.5 x 5.3 cm and greatest transverse dimensions.  There was also noted to be some mild obstructive disease at the origin of the celiac axis, SMA, and left renal artery.  There was also moderate proximal right renal artery stenosis, and occluded IMA.  He was noted to have tortuous bilateral iliac arteries without evidence of aneurysmal disease or significant obstructive disease.  He was incidentally noted to have at least one calcified gallstone without evidence of cholecystitis or biliary obstruction as well as an umbilical hernia containing fat, bilateral inguinal hernias containing fat as well as a tiny portion of the distal descending colon in the left inguinal hernia sac without strangulation and nephrolithiasis of the left kidney.  Patient is scheduled for endovascular repair of his AAA on 01/02/2018.  He presents today for cardiac evaluation.  Patient continues to do reasonably well from a cardiac perspective.  He indicates his limiting factor is significant bilateral leg fatigue and cramping with prolonged ambulation.  He denies any symptoms concerning for angina or dyspnea.  He does have underlying COPD with chronic shortness of breath though states this is stable.  He has had one mechanical fall the week prior in the setting of tripping in the dark.  No BRBPR or melena.  He is able to achieve at least 4 METS without issues from a cardiac perspective.   Past Medical History:  Diagnosis Date  . AAA (abdominal aortic  aneurysm) ([place])    a.  CTA 7/19: measured 4.5 x 5.3 cm and greatest transverse dimensions  . Abnormal nuclear stress test    a.  Myoview 2012: anterior wall ischemia with an estimated EF of 26%.  This was a new wall motion abnormality as well as a newly reduced EF  . COPD (chronic obstructive pulmonary disease) ([place])   .  Coronary artery disease    a.  Posterior MI in 2002 status post BMS to the LCx; b. [place] 2012: 95% stenosis of the proximal LAD, 95% stenosis of the mid LAD, 30% in-stent restenosis of the mid left circumflex with a second lesion of diffuse 50% stenosis.  The patient underwent successful PCI/BMS to the mid LAD with 0% residual stenosis, LV gram not performed  . History of kidney stones   . History of prostate cancer   . Hyperlipidemia   . Hypertension   . Neuromuscular disorder ([place])   . Prostate cancer ([place])    a.  Status post seeding  . PVD (peripheral vascular disease) ([place])     Past Surgical History:  Procedure Laterality Date  . BACK SURGERY  2009/2010   x 2  . CARDIAC CATHETERIZATION  2002   stent placement [place]  . FOOT SURGERY     bilateral   . HERNIA REPAIR     bilateral   . KNEE SURGERY     right knee   . PROSTATE SURGERY  09/2009   prostate implant  . SPINE SURGERY      Current Meds  Medication Sig  . albuterol (PROVENTIL HFA;VENTOLIN HFA) 108 (90 Base) MCG/ACT inhaler Inhale 2 puffs into the lungs every 6 (six) hours as needed for wheezing.  [name] aspirin 81 MG tablet Take 81 mg by mouth daily.  [name] atorvastatin (LIPITOR) 40 MG tablet TAKE 1 TABLET BY MOUTH EVERY DAY AT 6PM  . budesonide-formoterol (SYMBICORT) 160-4.5 MCG/ACT inhaler Inhale 2 puffs into the lungs 2 (two) times daily.  . methocarbamol (ROBAXIN) 500 MG tablet Take 1 tablet (500 mg total) by mouth 2 (two) times daily.  [name] omeprazole (PRILOSEC) 40 MG capsule TAKE 1 CAPSULE BY MOUTH DAILY USUALLY 30MINUTES BEFORE BREAKFAST  . polyethylene glycol powder (GLYCOLAX/MIRALAX) powder Take 17 g by mouth as needed.   . traMADol (ULTRAM) 50 MG tablet Take 1 tablet (50 mg total) by mouth 4 (four) times daily as needed.    Allergies:   Lyrica [pregabalin] and Prednisone   Social History:  The patient  reports that he has been smoking cigarettes. He has a 56.00 pack-year smoking history. He has never used smokeless  tobacco. He reports that he does not drink alcohol or use drugs.   Family History:  The patient's family history includes Diabetes in his mother; Heart attack in his mother; Heart disease in his father.  ROS:   Review of Systems  Constitutional: Positive for malaise/fatigue. Negative for chills, diaphoresis, fever and weight loss.  HENT: Negative for congestion.   Eyes: Negative for discharge and redness.  Respiratory: Positive for shortness of breath. Negative for cough, hemoptysis, sputum production and wheezing.   Cardiovascular: Positive for claudication. Negative for chest pain, palpitations, orthopnea, leg swelling and PND.  Gastrointestinal: Negative for abdominal pain, blood in stool, heartburn, melena, nausea and vomiting.  Genitourinary: Negative for hematuria.  Musculoskeletal: Positive for falls. Negative for myalgias.       Isolated mechanical fall the week prior in the setting of lightbulb going out and patient falling into  the doorway  Skin: Negative for rash.  Neurological: Positive for weakness. Negative for dizziness, tingling, tremors, sensory change, speech change, focal weakness and loss of consciousness.  Endo/Heme/Allergies: Does not bruise/bleed easily.  Psychiatric/Behavioral: Negative for substance abuse. The patient is not nervous/anxious.   All other systems reviewed and are negative.    PHYSICAL EXAM:  VS:  BP (!) 148/72 (BP Location: Left Arm, Patient Position: Sitting, Cuff Size: Normal)   Pulse 66   Ht 6' (1.829 m)   Wt 198 lb (89.8 kg)   BMI 26.85 kg/m  BMI: Body mass index is 26.85 kg/m.  Physical Exam  Constitutional: He is oriented to person, place, and time. He appears well-developed and well-nourished.  HENT:  Head: Normocephalic and atraumatic.  Eyes: Right eye exhibits no discharge. Left eye exhibits no discharge.  Neck: Normal range of motion. No JVD present.  Cardiovascular: Normal rate, regular rhythm, S1 normal, S2 normal and normal  heart sounds. Exam reveals no distant heart sounds, no friction rub, no midsystolic click and no opening snap.  No murmur heard. Pulses:      Posterior tibial pulses are 2+ on the right side, and 1+ on the left side.  Pulmonary/Chest: Effort normal and breath sounds normal. No respiratory distress. He has no decreased breath sounds. He has no wheezes. He has no rales. He exhibits no tenderness.  Abdominal: Soft. He exhibits no distension. There is no tenderness.  Musculoskeletal: He exhibits no edema.  Neurological: He is alert and oriented to person, place, and time.  Skin: Skin is warm and dry. No cyanosis. Nails show no clubbing.  Psychiatric: He has a normal mood and affect. His speech is normal and behavior is normal. Judgment and thought content normal.      EKG:  Was ordered and interpreted by me today. Shows NSR, 66 bpm, LVH with early repolarization, prior inferior infarct, T wave inversion leads I, aVL, V4-V6  Recent Labs: 10/30/2017: Creatinine, Ser 1.00  No results found for requested labs within last 8760 hours.   CrCl cannot be calculated (Patient's most recent lab result is older than the maximum 21 days allowed.).   Wt Readings from Last 3 Encounters:  12/20/17 198 lb (89.8 kg)  10/30/17 204 lb (92.5 kg)  10/25/17 203 lb (92.1 kg)     Other studies reviewed: Additional studies/records reviewed today include: summarized above  ASSESSMENT AND PLAN:  1. Preoperative cardiac evaluation: Per modified Lee's criteria patient is at least moderate to high risk for noncardiac surgery.  Some of this will depend on if the patient actually has a cardiomyopathy as detailed below.  Nonetheless, prior to providing final risk stratification we have recommended the patient undergo ischemic evaluation with a Lexiscan Myoview as well as an echocardiogram to further evaluate his ejection fraction.  Further recommendations will be made following these studies.  2. Coronary artery disease  involving the native coronary arteries without angina/abnormal EKG: Patient is currently symptom-free.  He does have new lateral T wave inversions on EKG today.  Given his prior history and new EKG changes we have agreed to proceed with a Lexiscan Myoview to evaluate for high risk ischemia for further risk stratification of his AAA repair.  Recommend he continue aspirin 81 mg daily through the perioperative period.  Aggressive risk factor modification including smoking cessation and secondary prevention.  3. Possible cardiomyopathy: As noted above, patient previously underwent Myoview in 2010 that demonstrated an EF of 60%.  Follow-up Myoview in 2012 demonstrated an  EF of 26%.  Further imaging has not been obtained.  We have scheduled an echocardiogram to further evaluate his ejection fraction for risk stratification as well as optimization of medications as needed.  4. AAA: Planning for endovascular repair by vascular surgery on 01/02/2018.  Optimal blood pressure, heart rate, and lipid control is recommended.  5. PVD: Patient is describing symptoms concerning for claudication.  Defer to vascular surgery.  Optimal lipid control as above.  6. Hypertension: Blood pressure is mildly elevated today at 148/72.  Patient and his wife indicate his blood pressure typically runs in the 1 teens to 120s over 96s.  Should his blood pressure continue to run on the high side would recommend addition of antihypertensive therapy.  7. Hyperlipidemia: Last LDL checked in 2017 noted to be 71.  Recommend patient obtain fasting lipid and liver function either at PCPs office or in follow-up.  Goal LDL less than 70.  Continue atorvastatin 40 mg daily.  8. Tobacco abuse/COPD: Complete cessation advised.  Disposition: F/u with Dr. Rockey Situ in 3 months, sooner if needed.  Current medicines are reviewed at length with the patient today.  The patient did not have any concerns regarding medicines.  Signed, Christell Faith,  PA-C 12/20/2017 4:42 PM     [address] [place], [place] [address] [phone]

## 2017-12-21 ENCOUNTER — Other Ambulatory Visit: Payer: Self-pay | Admitting: Physician Assistant

## 2017-12-21 ENCOUNTER — Telehealth: Payer: Self-pay

## 2017-12-21 ENCOUNTER — Other Ambulatory Visit: Payer: Self-pay | Admitting: *Deleted

## 2017-12-21 ENCOUNTER — Other Ambulatory Visit (INDEPENDENT_AMBULATORY_CARE_PROVIDER_SITE_OTHER): Payer: Self-pay | Admitting: Nurse Practitioner

## 2017-12-21 ENCOUNTER — Ambulatory Visit
Admission: RE | Admit: 2017-12-21 | Discharge: 2017-12-21 | Disposition: A | Discharge status: Home / Self Care | Payer: Medicare Other | Source: Ambulatory Visit | Attending: Cardiovascular Disease | Admitting: Cardiovascular Disease

## 2017-12-21 DIAGNOSIS — R9431 Abnormal electrocardiogram [ECG] [EKG]: Secondary | ICD-10-CM

## 2017-12-21 DIAGNOSIS — I429 Cardiomyopathy, unspecified: Secondary | ICD-10-CM

## 2017-12-21 DIAGNOSIS — E782 Mixed hyperlipidemia: Secondary | ICD-10-CM

## 2017-12-21 LAB — NM MYOCAR MULTI W/SPECT W/WALL MOTION / EF
CHL CUP RESTING HR STRESS: 66 {beats}/min
CSEPHR: 65 %
LV dias vol: 292 mL (ref 62–150)
LVSYSVOL: 211 mL
Peak HR: 96 {beats}/min
SDS: 4
SRS: 15
SSS: 16
TID: 0.94

## 2017-12-21 MED ORDER — TECHNETIUM TC 99M TETROFOSMIN IV KIT
30.0000 | PACK | Freq: Once | INTRAVENOUS | Status: AC | PRN
  Administered 2017-12-21: 31.24 via INTRAVENOUS

## 2017-12-21 MED ORDER — REGADENOSON 0.4 MG/5ML IV SOLN
0.4000 mg | Freq: Once | INTRAVENOUS | Status: AC
  Administered 2017-12-21: 0.4 mg via INTRAVENOUS

## 2017-12-21 MED ORDER — TECHNETIUM TC 99M TETROFOSMIN IV KIT
10.0000 | PACK | Freq: Once | INTRAVENOUS | Status: AC | PRN
  Administered 2017-12-21: 13.78 via INTRAVENOUS

## 2017-12-21 NOTE — Telephone Encounter (Signed)
S/w Pre-Admission testing. They had me enter the order and will place a note on his chart to draw the lab work.   Notified patient's wife and she will remind them about the lab work when they go as well.

## 2017-12-21 NOTE — Telephone Encounter (Signed)
If they are able to draw this at that time, that would be ok.

## 2017-12-21 NOTE — Addendum Note (Signed)
Addended by: Alba Destine on: 12/21/2017 11:35 AM   Modules accepted: Orders

## 2017-12-21 NOTE — Telephone Encounter (Signed)
Patient is currently having the stress test in the [facility]. Patient's wife was wondering if patient needed to have his cholesterol checked at his preop appointment this Tuesday. She remembers Thurmond Butts mentioned it but was not sure if patient needed it now or not. Routing to [employer] for advice.

## 2017-12-21 NOTE — Telephone Encounter (Signed)
Please contact the patient's wife Loletha Carrow) regarding upcoming appointments.  The patient's wife Loletha Carrow) would like to know where Mr. Reichart should go for lab. Please contact patient's wife Loletha Carrow) because the patient is having a test done today.

## 2017-12-25 ENCOUNTER — Telehealth: Payer: Self-pay | Admitting: Cardiovascular Disease

## 2017-12-25 ENCOUNTER — Encounter
Admission: RE | Admit: 2017-12-25 | Discharge: 2017-12-25 | Disposition: A | Discharge status: Home / Self Care | Payer: Medicare Other | Source: Ambulatory Visit | Attending: Vascular Surgery | Admitting: Vascular Surgery

## 2017-12-25 ENCOUNTER — Other Ambulatory Visit: Payer: Self-pay

## 2017-12-25 ENCOUNTER — Ambulatory Visit (INDEPENDENT_AMBULATORY_CARE_PROVIDER_SITE_OTHER): Payer: Medicare Other

## 2017-12-25 DIAGNOSIS — I429 Cardiomyopathy, unspecified: Secondary | ICD-10-CM | POA: Diagnosis not present

## 2017-12-25 DIAGNOSIS — R9431 Abnormal electrocardiogram [ECG] [EKG]: Secondary | ICD-10-CM | POA: Diagnosis not present

## 2017-12-25 DIAGNOSIS — Z01812 Encounter for preprocedural laboratory examination: Secondary | ICD-10-CM | POA: Insufficient documentation

## 2017-12-25 DIAGNOSIS — E782 Mixed hyperlipidemia: Secondary | ICD-10-CM

## 2017-12-25 DIAGNOSIS — I714 Abdominal aortic aneurysm, without rupture: Secondary | ICD-10-CM | POA: Diagnosis not present

## 2017-12-25 HISTORY — DX: Cerebral infarction, unspecified: I63.9

## 2017-12-25 HISTORY — DX: Acute myocardial infarction, unspecified: I21.9

## 2017-12-25 HISTORY — DX: Gastro-esophageal reflux disease without esophagitis: K21.9

## 2017-12-25 LAB — CBC WITH DIFFERENTIAL/PLATELET
BASOS ABS: 0.1 10*3/uL (ref 0–0.1)
Basophils Relative: 1 %
Eosinophils Absolute: 0.3 10*3/uL (ref 0–0.7)
Eosinophils Relative: 3 %
HCT: 46.4 % (ref 40.0–52.0)
HEMOGLOBIN: 16.1 g/dL (ref 13.0–18.0)
LYMPHS PCT: 15 %
Lymphs Abs: 1.5 10*3/uL (ref 1.0–3.6)
MCH: 32.4 pg (ref 26.0–34.0)
MCHC: 34.6 g/dL (ref 32.0–36.0)
MCV: 93.6 fL (ref 80.0–100.0)
Monocytes Absolute: 0.7 10*3/uL (ref 0.2–1.0)
Monocytes Relative: 7 %
NEUTROS ABS: 7.1 10*3/uL — AB (ref 1.4–6.5)
Neutrophils Relative %: 74 %
Platelets: 166 10*3/uL (ref 150–440)
RBC: 4.96 MIL/uL (ref 4.40–5.90)
RDW: 13.2 % (ref 11.5–14.5)
WBC: 9.7 10*3/uL (ref 3.8–10.6)

## 2017-12-25 LAB — BASIC METABOLIC PANEL
Anion gap: 8 (ref 5–15)
BUN: 16 mg/dL (ref 8–23)
CHLORIDE: 108 mmol/L (ref 98–111)
CO2: 28 mmol/L (ref 22–32)
Calcium: 8.4 mg/dL — ABNORMAL LOW (ref 8.9–10.3)
Creatinine, Ser: 1.08 mg/dL (ref 0.61–1.24)
GFR calc Af Amer: >60 mL/min (ref >60)
GFR calc non Af Amer: >60 mL/min (ref >60)
GLUCOSE: 95 mg/dL (ref 70–99)
POTASSIUM: 3.9 mmol/L (ref 3.5–5.1)
Sodium: 144 mmol/L (ref 135–145)

## 2017-12-25 LAB — TYPE AND SCREEN
ABO/RH(D): O POS
ANTIBODY SCREEN: NEGATIVE

## 2017-12-25 LAB — LIPID PANEL
Cholesterol: 130 mg/dL (ref 0–200)
HDL: 37 mg/dL — ABNORMAL LOW (ref >40)
LDL Cholesterol: 81 mg/dL (ref 0–99)
Total CHOL/HDL Ratio: 3.5 RATIO
Triglycerides: 60 mg/dL (ref <150)
VLDL: 12 mg/dL (ref 0–40)

## 2017-12-25 LAB — ECHOCARDIOGRAM COMPLETE
Height: 72 in
Weight: 3152 oz

## 2017-12-25 LAB — PROTIME-INR
INR: 0.99
Prothrombin Time: 13 seconds (ref 11.4–15.2)

## 2017-12-25 LAB — APTT: aPTT: 30 seconds (ref 24–36)

## 2017-12-25 MED ORDER — ATORVASTATIN CALCIUM 80 MG PO TABS: 80.0000 mg | ORAL_TABLET | Freq: Every day | ORAL | 3 refills | Status: DC

## 2017-12-25 MED ORDER — PERFLUTREN LIPID MICROSPHERE
1.0000 mL | INTRAVENOUS | Status: AC | PRN
  Administered 2017-12-25: 2 mL via INTRAVENOUS

## 2017-12-25 NOTE — Telephone Encounter (Signed)
Thank you for the update. I would like to wait and see what the final read on his echo from 12/25/17 is prior to adding medications. Based on this echo, we would use certain medications over others to address his BP and potentially any abnormal findings noted on the echo. He should follow up with his PCP for hypoxia.   Regarding final risk stratification leading up to his planned surgery, we will need to await his echo to further evaluate his EF.

## 2017-12-25 NOTE — Telephone Encounter (Signed)
The patient's wife is aware of his lab results & stress test results. (ok per DPR). They are willing to start the increased dose of lipitor 80 mg once daily.  RX to be sent to [facility].   The patient is due to be seen by Dr. Rockey Situ on 03/26/18. They would prefer to have a lipid/ liver panel repeated here in the office. I have advised the patient's wife that I will have scheduling call her to arrange an appointment for fasting labs the week prior to him seeing Dr. Rockey Situ. She is agreeable.  She also wanted to make [place], [place] aware that the patient's BP was still elevated at pre-op this morning at 147/90 & his O2 sat was 91%.  They have not checked his BP at home. They have a family member who is a Marine scientist and they will try to have her check it tonight. They will try to have 1 or 2 readings from home for [place] when we call with the echo results he had today.   Will forward to [place], [place] to review/ for further recommendations.

## 2017-12-25 NOTE — Telephone Encounter (Signed)
Patient wife checking status of results to stress test and lab work done last week Please call to discuss

## 2017-12-25 NOTE — Patient Instructions (Signed)
Your procedure is scheduled on: Wednesday 01/02/18 Report to ADMITTING DESK AT 11:15 IN [place] .  Remember: Instructions that are not followed completely may result in serious medical risk, up to and including death, or upon the discretion of your surgeon and anesthesiologist your surgery may need to be rescheduled.     _X__ 1. Do not eat food after midnight the night before your procedure.                 No gum chewing or hard candies. You may drink clear liquids up to 2 hours                 before you are scheduled to arrive for your surgery- DO not drink clear                 liquids within 2 hours of the start of your surgery.                 Clear Liquids include:  water, apple juice without pulp, clear carbohydrate                 drink such as Clearfast or Gatorade, Black Coffee or Tea (Do not add                 anything to coffee or tea).  __X__2.  On the morning of surgery brush your teeth with toothpaste and water, you                 may rinse your mouth with mouthwash if you wish.  Do not swallow any              toothpaste of mouthwash.     _X__ 3.  No Alcohol for 24 hours before or after surgery.   _X__ 4.  Do Not Smoke or use e-cigarettes For 24 Hours Prior to Your Surgery.                 Do not use any chewable tobacco products for at least 6 hours prior to                 surgery.  ____  5.  Bring all medications with you on the day of surgery if instructed.   __X__  6.  Notify your doctor if there is any change in your medical condition      (cold, fever, infections).     Do not wear jewelry, make-up, hairpins, clips or nail polish. Do not wear lotions, powders, or perfumes.  Do not shave 48 hours prior to surgery. Men may shave face and neck. Do not bring valuables to the hospital.    [facility] is not responsible for any belongings or valuables.  Contacts, dentures/partials or body piercings may not be worn into surgery. Bring a case for your  contacts, glasses or hearing aids, a denture cup will be supplied. Leave your suitcase in the car. After surgery it may be brought to your room. For patients admitted to the hospital, discharge time is determined by your treatment team.   Patients discharged the day of surgery will not be allowed to drive home.   Please read over the following fact sheets that you were given:   MRSA Information  __X__ Take these medicines the morning of surgery with A SIP OF WATER:    1. oMEPRAZOLE  2.   3.   4.  5.  6.  ____ Fleet Enema (  as directed)   __X__ Use CHG Soap/SAGE wipes as directed  __X__ Use inhalers on the day of surgery  ____ Stop metformin/Janumet/Farxiga 2 days prior to surgery    ____ Take 1/2 of usual insulin dose the night before surgery. No insulin the morning          of surgery.   ____ Stop Blood Thinners Coumadin/Plavix/Xarelto/Pleta/Pradaxa/Eliquis/Effient/Aspirin  on   Or contact your Surgeon, Cardiologist or Medical Doctor regarding  ability to stop your blood thinners  __X__ Stop Anti-inflammatories 7 days before surgery such as Advil, Ibuprofen, Motrin,  BC or Goodies Powder, Naprosyn, Naproxen, Aleve   __X__ Stop all herbal supplements, fish oil or vitamin E until after surgery.    ____ Bring C-Pap to the hospital.

## 2017-12-26 ENCOUNTER — Encounter: Payer: Self-pay | Admitting: *Deleted

## 2017-12-26 ENCOUNTER — Telehealth: Payer: Self-pay | Admitting: Physician Assistant

## 2017-12-26 DIAGNOSIS — I429 Cardiomyopathy, unspecified: Secondary | ICD-10-CM

## 2017-12-26 DIAGNOSIS — Z79899 Other long term (current) drug therapy: Secondary | ICD-10-CM

## 2017-12-26 DIAGNOSIS — I10 Essential (primary) hypertension: Secondary | ICD-10-CM

## 2017-12-26 MED ORDER — CARVEDILOL 3.125 MG PO TABS: 3.1250 mg | ORAL_TABLET | Freq: Two times a day (BID) | ORAL | 3 refills | Status: DC

## 2017-12-26 MED ORDER — SACUBITRIL-VALSARTAN 24-26 MG PO TABS: 1.0000 | ORAL_TABLET | Freq: Two times a day (BID) | ORAL | 3 refills | Status: DC

## 2017-12-26 NOTE — Telephone Encounter (Signed)
I have spoken with the patient and his wife and notified them of Thurmond Butts, [place] recommendations regarding his echocardiogram.  Per Thurmond Butts, PA: Echo showed an EF of 30-35%, no regional wall motion abnormalities, grade 1 diastolic dysfunction, mildly dilated aortic root at 39 mm, mild mitral regurgitation, mildly dilated left atrium and normal pulmonary arterial pressure.     1) Case has been discussed with Drs. Arida and [place].  2) Start Entresto 24/26 mg bid and Coreg 3.125 mg bid.  3) Will escalate evidence-based heart failure therapy, including possible addition of spironolactone, as indicated and allowed based on vital signs.  4)He will need a follow up bmet in 1 week following initiation of Entresto to assess his renal function and potassium.  5) He will be at least moderate risk for endovascular repair of his AAA.  6) Per Dr. Fletcher Anon, he is not cleared for an open repair of his AAA, if needed. He will need to be further risk stratified with a LHC prior if open repair is required, unless vascular surgery feels repair of his AAA in an open setting is required to save the patient's life  7) He will need further ischemic evaluation following his AAA repair (no symptoms concerning for angina at his pre-operative visit).  8) I have sent a message to Dr. Lucky Cowboy to discuss this patient, and will also forward this result note to him.   9) Please have patient follow up with Dr. Rockey Situ or an APP within 2 weeks.     They are aware that:  1)  I have pulled samples of Entresto 24/26 mg tablets for the patient to pick up  Entresto 24/26 mg Lot: [id number] Exp: 6/21 # 1 box  Free #30 trial card info also sent on RX to the pharmacy  (Please Honor Card patient is presenting for Carmie Kanner: [id number]     RXGRP: [id number] RXCPN: [id number]           ISSUER:  [id number] ID: [id number]  2) Coreg 3.125 mg BID sent to the pharmacy  3) BMP [facility] placed [facility] to be done 9/16 or 9/17  4) Follow up Dr.  Rockey Situ on 01/09/18 @ 3:20 pm

## 2017-12-27 ENCOUNTER — Ambulatory Visit: Payer: Medicare Other | Admitting: Cardiovascular Disease

## 2017-12-27 ENCOUNTER — Telehealth: Payer: Self-pay | Admitting: *Deleted

## 2017-12-27 NOTE — Telephone Encounter (Signed)
See 12/26/17 phone note.

## 2017-12-27 NOTE — Telephone Encounter (Signed)
[email or web address] Prior Auth for [employer] submitted. Key: [id number] For Entresto 24-26 mg tablets. Waiting for determination.

## 2017-12-28 NOTE — Telephone Encounter (Signed)
Fax received from [facility] dated 12/27/17, that the patient's Entresto 24/26 mg tablets has received prior authorization.  Good from 12/27/17- 12/28/19.  Copy of the patient's prior approval has been placed in nurse prior approval file.  I have called [place] and made them aware this has been received.

## 2017-12-31 ENCOUNTER — Other Ambulatory Visit
Admission: RE | Admit: 2017-12-31 | Discharge: 2017-12-31 | Disposition: A | Discharge status: Home / Self Care | Payer: Medicare Other | Source: Ambulatory Visit | Attending: Physician Assistant | Admitting: Physician Assistant

## 2017-12-31 ENCOUNTER — Telehealth: Payer: Self-pay | Admitting: Cardiovascular Disease

## 2017-12-31 DIAGNOSIS — Z79899 Other long term (current) drug therapy: Secondary | ICD-10-CM | POA: Insufficient documentation

## 2017-12-31 DIAGNOSIS — E782 Mixed hyperlipidemia: Secondary | ICD-10-CM | POA: Insufficient documentation

## 2017-12-31 DIAGNOSIS — I429 Cardiomyopathy, unspecified: Secondary | ICD-10-CM | POA: Insufficient documentation

## 2017-12-31 DIAGNOSIS — I10 Essential (primary) hypertension: Secondary | ICD-10-CM | POA: Insufficient documentation

## 2017-12-31 LAB — HEPATIC FUNCTION PANEL
ALBUMIN: 3.6 g/dL (ref 3.5–5.0)
ALK PHOS: 91 U/L (ref 38–126)
ALT: 13 U/L (ref 0–44)
AST: 15 U/L (ref 15–41)
BILIRUBIN TOTAL: 1.1 mg/dL (ref 0.3–1.2)
Bilirubin, Direct: 0.2 mg/dL (ref 0.0–0.2)
Indirect Bilirubin: 0.9 mg/dL (ref 0.3–0.9)
TOTAL PROTEIN: 6.6 g/dL (ref 6.5–8.1)

## 2017-12-31 LAB — BASIC METABOLIC PANEL
Anion gap: 7 (ref 5–15)
BUN: 16 mg/dL (ref 8–23)
CO2: 29 mmol/L (ref 22–32)
CREATININE: 1.02 mg/dL (ref 0.61–1.24)
Calcium: 8.7 mg/dL — ABNORMAL LOW (ref 8.9–10.3)
Chloride: 106 mmol/L (ref 98–111)
GFR calc Af Amer: >60 mL/min (ref >60)
Glucose, Bld: 105 mg/dL — ABNORMAL HIGH (ref 70–99)
Potassium: 3.9 mmol/L (ref 3.5–5.1)
SODIUM: 142 mmol/L (ref 135–145)

## 2017-12-31 LAB — LIPID PANEL
CHOL/HDL RATIO: 3.2 ratio
CHOLESTEROL: 123 mg/dL (ref 0–200)
HDL: 38 mg/dL — ABNORMAL LOW (ref >40)
LDL Cholesterol: 73 mg/dL (ref 0–99)
Triglycerides: 59 mg/dL (ref <150)
VLDL: 12 mg/dL (ref 0–40)

## 2017-12-31 NOTE — Telephone Encounter (Signed)
Left a message for the patient to call back.  

## 2017-12-31 NOTE — Addendum Note (Signed)
Addended by: Santiago Bur on: 12/31/2017 11:17 AM   Modules accepted: Orders

## 2017-12-31 NOTE — Telephone Encounter (Signed)
Pt c/o medication issue:  1. Name of Medication: Lipitor   2. How are you currently taking this medication (dosage and times per day)? 80 mg po q day  3. Are you having a reaction (difficulty breathing--STAT)? Diarrhea   4. What is your medication issue? Increased to 80 mg and is causing diarrhea patient wants to know if he can take 40 mg BID instead

## 2017-12-31 NOTE — Telephone Encounter (Signed)
Left a message to call back.

## 2017-12-31 NOTE — Telephone Encounter (Signed)
Pt wife is returning your call.  

## 2018-01-01 ENCOUNTER — Encounter: Payer: Self-pay | Admitting: Anesthesiology

## 2018-01-01 MED ORDER — CEFAZOLIN SODIUM-DEXTROSE 2-4 GM/100ML-% IV SOLN
2.0000 g | INTRAVENOUS | Status: AC
  Administered 2018-01-02: 2 g via INTRAVENOUS

## 2018-01-01 NOTE — Telephone Encounter (Signed)
Received incoming call from patient's wife, ok per DPR. Patient took first dose of lipitor 80 mg on 9/11.  Wife says patient had diarrhea after that dose. Since then, he's been taking 40 mg BID and has not had diarrhea. Advised the the best way is to take at one time with the evening meal. Advised to try another time to take 80 mg in the evening and if he does ok then continue taking it that way. If diarrhea recurs, then call us to let us know for further advice. She was appreciative and verbalized understanding.

## 2018-01-02 ENCOUNTER — Encounter
Admission: RE | Disposition: A | Discharge status: Home / Self Care | Payer: Self-pay | Source: Ambulatory Visit | Attending: Vascular Surgery

## 2018-01-02 ENCOUNTER — Other Ambulatory Visit: Payer: Self-pay

## 2018-01-02 ENCOUNTER — Inpatient Hospital Stay: Payer: Medicare Other | Admitting: Anesthesiology

## 2018-01-02 ENCOUNTER — Inpatient Hospital Stay
Admission: RE | Admit: 2018-01-02 | Discharge: 2018-01-03 | DRG: 269 | Disposition: A | Discharge status: Home / Self Care | Payer: Medicare Other | Source: Ambulatory Visit | Attending: Vascular Surgery | Admitting: Vascular Surgery

## 2018-01-02 DIAGNOSIS — I2510 Atherosclerotic heart disease of native coronary artery without angina pectoris: Secondary | ICD-10-CM | POA: Diagnosis present

## 2018-01-02 DIAGNOSIS — I739 Peripheral vascular disease, unspecified: Secondary | ICD-10-CM | POA: Diagnosis present

## 2018-01-02 DIAGNOSIS — I714 Abdominal aortic aneurysm, without rupture, unspecified: Secondary | ICD-10-CM | POA: Diagnosis present

## 2018-01-02 DIAGNOSIS — I10 Essential (primary) hypertension: Secondary | ICD-10-CM | POA: Diagnosis present

## 2018-01-02 DIAGNOSIS — Z833 Family history of diabetes mellitus: Secondary | ICD-10-CM

## 2018-01-02 DIAGNOSIS — J449 Chronic obstructive pulmonary disease, unspecified: Secondary | ICD-10-CM | POA: Diagnosis present

## 2018-01-02 DIAGNOSIS — Z8249 Family history of ischemic heart disease and other diseases of the circulatory system: Secondary | ICD-10-CM | POA: Diagnosis not present

## 2018-01-02 DIAGNOSIS — F17210 Nicotine dependence, cigarettes, uncomplicated: Secondary | ICD-10-CM | POA: Diagnosis present

## 2018-01-02 DIAGNOSIS — Z955 Presence of coronary angioplasty implant and graft: Secondary | ICD-10-CM

## 2018-01-02 DIAGNOSIS — E785 Hyperlipidemia, unspecified: Secondary | ICD-10-CM | POA: Diagnosis present

## 2018-01-02 DIAGNOSIS — K219 Gastro-esophageal reflux disease without esophagitis: Secondary | ICD-10-CM | POA: Diagnosis present

## 2018-01-02 DIAGNOSIS — Z8546 Personal history of malignant neoplasm of prostate: Secondary | ICD-10-CM

## 2018-01-02 DIAGNOSIS — Z8673 Personal history of transient ischemic attack (TIA), and cerebral infarction without residual deficits: Secondary | ICD-10-CM

## 2018-01-02 DIAGNOSIS — Z888 Allergy status to other drugs, medicaments and biological substances status: Secondary | ICD-10-CM | POA: Diagnosis not present

## 2018-01-02 DIAGNOSIS — Z87442 Personal history of urinary calculi: Secondary | ICD-10-CM

## 2018-01-02 DIAGNOSIS — I252 Old myocardial infarction: Secondary | ICD-10-CM | POA: Diagnosis not present

## 2018-01-02 HISTORY — PX: ENDOVASCULAR STENT GRAFT (AAA): CATH118280

## 2018-01-02 LAB — GLUCOSE, CAPILLARY: Glucose-Capillary: 135 mg/dL — ABNORMAL HIGH (ref 70–99)

## 2018-01-02 LAB — MRSA PCR SCREENING: MRSA BY PCR: NEGATIVE

## 2018-01-02 LAB — ABO/RH: ABO/RH(D): O POS

## 2018-01-02 SURGERY — ENDOVASCULAR STENT GRAFT (AAA)
Anesthesia: General

## 2018-01-02 MED ORDER — TRAMADOL HCL 50 MG PO TABS: 50.0000 mg | ORAL_TABLET | Freq: Four times a day (QID) | ORAL | Status: DC | PRN

## 2018-01-02 MED ORDER — POLYETHYLENE GLYCOL 3350 17 G PO PACK: 17.0000 g | PACK | Freq: Every day | ORAL | Status: DC | PRN

## 2018-01-02 MED ORDER — LABETALOL HCL 5 MG/ML IV SOLN
10.0000 mg | INTRAVENOUS | Status: DC | PRN
  Administered 2018-01-02 (×2): 10 mg via INTRAVENOUS

## 2018-01-02 MED ORDER — FENTANYL CITRATE (PF) 100 MCG/2ML IJ SOLN
INTRAMUSCULAR | Status: DC | PRN
  Administered 2018-01-02: 100 ug via INTRAVENOUS

## 2018-01-02 MED ORDER — MOMETASONE FURO-FORMOTEROL FUM 200-5 MCG/ACT IN AERO
2.0000 | INHALATION_SPRAY | Freq: Two times a day (BID) | RESPIRATORY_TRACT | Status: DC
  Administered 2018-01-02 – 2018-01-03 (×2): 2 via RESPIRATORY_TRACT
  Filled 2018-01-02: qty 8.8

## 2018-01-02 MED ORDER — ACETAMINOPHEN 325 MG RE SUPP
325.0000 mg | RECTAL | Status: DC | PRN
  Filled 2018-01-02: qty 2

## 2018-01-02 MED ORDER — SODIUM CHLORIDE 0.9 % IV SOLN
INTRAVENOUS | Status: DC | PRN
  Administered 2018-01-02: 30 ug/min via INTRAVENOUS

## 2018-01-02 MED ORDER — SUCCINYLCHOLINE CHLORIDE 20 MG/ML IJ SOLN
INTRAMUSCULAR | Status: AC
  Filled 2018-01-02: qty 1

## 2018-01-02 MED ORDER — ONDANSETRON HCL 4 MG/2ML IJ SOLN: 4.0000 mg | Freq: Four times a day (QID) | INTRAMUSCULAR | Status: DC | PRN

## 2018-01-02 MED ORDER — ONDANSETRON HCL 4 MG/2ML IJ SOLN
INTRAMUSCULAR | Status: AC
  Filled 2018-01-02: qty 2

## 2018-01-02 MED ORDER — DEXAMETHASONE SODIUM PHOSPHATE 10 MG/ML IJ SOLN
INTRAMUSCULAR | Status: DC | PRN
  Administered 2018-01-02: 10 mg via INTRAVENOUS

## 2018-01-02 MED ORDER — FAMOTIDINE IN NACL 20-0.9 MG/50ML-% IV SOLN
20.0000 mg | Freq: Two times a day (BID) | INTRAVENOUS | Status: DC
  Administered 2018-01-02: 20 mg via INTRAVENOUS
  Filled 2018-01-02: qty 50

## 2018-01-02 MED ORDER — DOPAMINE-DEXTROSE 3.2-5 MG/ML-% IV SOLN: 3.0000 ug/kg/min | INTRAVENOUS | Status: DC

## 2018-01-02 MED ORDER — ROCURONIUM BROMIDE 50 MG/5ML IV SOLN
INTRAVENOUS | Status: AC
  Filled 2018-01-02: qty 1

## 2018-01-02 MED ORDER — OXYCODONE-ACETAMINOPHEN 5-325 MG PO TABS: 1.0000 | ORAL_TABLET | ORAL | Status: DC | PRN

## 2018-01-02 MED ORDER — LIDOCAINE HCL (CARDIAC) PF 100 MG/5ML IV SOSY
PREFILLED_SYRINGE | INTRAVENOUS | Status: DC | PRN
  Administered 2018-01-02: 100 mg via INTRAVENOUS

## 2018-01-02 MED ORDER — HYDRALAZINE HCL 20 MG/ML IJ SOLN: 5.0000 mg | INTRAMUSCULAR | Status: DC | PRN

## 2018-01-02 MED ORDER — FAMOTIDINE IN NACL 20-0.9 MG/50ML-% IV SOLN
20.0000 mg | Freq: Once | INTRAVENOUS | Status: DC
  Filled 2018-01-02: qty 50

## 2018-01-02 MED ORDER — METOPROLOL TARTRATE 5 MG/5ML IV SOLN: 2.0000 mg | INTRAVENOUS | Status: DC | PRN

## 2018-01-02 MED ORDER — POLYETHYLENE GLYCOL 3350 17 GM/SCOOP PO POWD
17.0000 g | ORAL | Status: DC | PRN
  Filled 2018-01-02: qty 255

## 2018-01-02 MED ORDER — ONDANSETRON HCL 4 MG/2ML IJ SOLN
INTRAMUSCULAR | Status: DC | PRN
  Administered 2018-01-02: 4 mg via INTRAVENOUS

## 2018-01-02 MED ORDER — ALUM & MAG HYDROXIDE-SIMETH 200-200-20 MG/5ML PO SUSP
15.0000 mL | ORAL | Status: DC | PRN
  Filled 2018-01-02: qty 30

## 2018-01-02 MED ORDER — HEPARIN SODIUM (PORCINE) 1000 UNIT/ML IJ SOLN
INTRAMUSCULAR | Status: DC | PRN
  Administered 2018-01-02: 5000 [IU] via INTRAVENOUS

## 2018-01-02 MED ORDER — PROPOFOL 10 MG/ML IV BOLUS
INTRAVENOUS | Status: AC
  Filled 2018-01-02: qty 20

## 2018-01-02 MED ORDER — NITROGLYCERIN IN D5W 200-5 MCG/ML-% IV SOLN: 5.0000 ug/min | INTRAVENOUS | Status: DC

## 2018-01-02 MED ORDER — HYDROMORPHONE HCL 1 MG/ML IJ SOLN: 1.0000 mg | Freq: Once | INTRAMUSCULAR | Status: DC | PRN

## 2018-01-02 MED ORDER — LACTATED RINGERS IV SOLN
INTRAVENOUS | Status: DC
  Administered 2018-01-02: 07:00:00 via INTRAVENOUS

## 2018-01-02 MED ORDER — ADULT MULTIVITAMIN W/MINERALS CH: 1.0000 | ORAL_TABLET | Freq: Every day | ORAL | Status: DC

## 2018-01-02 MED ORDER — DEXAMETHASONE SODIUM PHOSPHATE 10 MG/ML IJ SOLN
INTRAMUSCULAR | Status: AC
  Filled 2018-01-02: qty 1

## 2018-01-02 MED ORDER — FENTANYL CITRATE (PF) 100 MCG/2ML IJ SOLN
25.0000 ug | INTRAMUSCULAR | Status: DC | PRN
  Administered 2018-01-02 (×4): 25 ug via INTRAVENOUS

## 2018-01-02 MED ORDER — SODIUM CHLORIDE 0.9 % IV SOLN: 500.0000 mL | Freq: Once | INTRAVENOUS | Status: DC | PRN

## 2018-01-02 MED ORDER — HEPARIN SODIUM (PORCINE) 1000 UNIT/ML IJ SOLN
INTRAMUSCULAR | Status: AC
  Filled 2018-01-02: qty 1

## 2018-01-02 MED ORDER — LIDOCAINE HCL (PF) 2 % IJ SOLN
INTRAMUSCULAR | Status: AC
  Filled 2018-01-02: qty 10

## 2018-01-02 MED ORDER — CEFAZOLIN SODIUM-DEXTROSE 2-4 GM/100ML-% IV SOLN
2.0000 g | Freq: Three times a day (TID) | INTRAVENOUS | Status: AC
  Administered 2018-01-02 – 2018-01-03 (×2): 2 g via INTRAVENOUS
  Filled 2018-01-02 (×2): qty 100

## 2018-01-02 MED ORDER — ACETAMINOPHEN 325 MG PO TABS
325.0000 mg | ORAL_TABLET | ORAL | Status: DC | PRN
  Administered 2018-01-02: 650 mg via ORAL
  Filled 2018-01-02: qty 2

## 2018-01-02 MED ORDER — SEVOFLURANE IN SOLN
RESPIRATORY_TRACT | Status: AC
  Filled 2018-01-02: qty 250

## 2018-01-02 MED ORDER — ROCURONIUM BROMIDE 100 MG/10ML IV SOLN
INTRAVENOUS | Status: DC | PRN
  Administered 2018-01-02: 30 mg via INTRAVENOUS
  Administered 2018-01-02 (×2): 20 mg via INTRAVENOUS

## 2018-01-02 MED ORDER — PANTOPRAZOLE SODIUM 40 MG PO TBEC
40.0000 mg | DELAYED_RELEASE_TABLET | Freq: Every day | ORAL | Status: DC
  Administered 2018-01-03: 40 mg via ORAL
  Filled 2018-01-02: qty 1

## 2018-01-02 MED ORDER — ASPIRIN 81 MG PO CHEW
81.0000 mg | CHEWABLE_TABLET | Freq: Every day | ORAL | Status: DC
  Administered 2018-01-02 – 2018-01-03 (×2): 81 mg via ORAL
  Filled 2018-01-02 (×2): qty 1

## 2018-01-02 MED ORDER — IOPAMIDOL (ISOVUE-300) INJECTION 61%
INTRAVENOUS | Status: DC | PRN
  Administered 2018-01-02: 50 mL via INTRAVENOUS

## 2018-01-02 MED ORDER — IPRATROPIUM-ALBUTEROL 0.5-2.5 (3) MG/3ML IN SOLN
3.0000 mL | Freq: Once | RESPIRATORY_TRACT | Status: AC
  Administered 2018-01-02: 3 mL via RESPIRATORY_TRACT

## 2018-01-02 MED ORDER — PHENYLEPHRINE HCL 10 MG/ML IJ SOLN
INTRAMUSCULAR | Status: AC
  Filled 2018-01-02: qty 1

## 2018-01-02 MED ORDER — PHENYLEPHRINE HCL 10 MG/ML IJ SOLN
INTRAMUSCULAR | Status: DC | PRN
  Administered 2018-01-02: 100 ug via INTRAVENOUS
  Administered 2018-01-02: 200 ug via INTRAVENOUS

## 2018-01-02 MED ORDER — POTASSIUM CHLORIDE CRYS ER 20 MEQ PO TBCR: 20.0000 meq | EXTENDED_RELEASE_TABLET | Freq: Every day | ORAL | Status: DC | PRN

## 2018-01-02 MED ORDER — MORPHINE SULFATE (PF) 4 MG/ML IV SOLN
2.0000 mg | INTRAVENOUS | Status: DC | PRN
  Administered 2018-01-02 – 2018-01-03 (×3): 2 mg via INTRAVENOUS
  Filled 2018-01-02 (×3): qty 1

## 2018-01-02 MED ORDER — MAGNESIUM SULFATE 2 GM/50ML IV SOLN: 2.0000 g | Freq: Every day | INTRAVENOUS | Status: DC | PRN

## 2018-01-02 MED ORDER — PHENOL 1.4 % MT LIQD
1.0000 | OROMUCOSAL | Status: DC | PRN
  Filled 2018-01-02: qty 177

## 2018-01-02 MED ORDER — ATORVASTATIN CALCIUM 20 MG PO TABS
80.0000 mg | ORAL_TABLET | Freq: Every day | ORAL | Status: DC
  Administered 2018-01-02: 80 mg via ORAL
  Filled 2018-01-02: qty 4

## 2018-01-02 MED ORDER — DOCUSATE SODIUM 100 MG PO CAPS
100.0000 mg | ORAL_CAPSULE | Freq: Every day | ORAL | Status: DC
  Filled 2018-01-02: qty 1

## 2018-01-02 MED ORDER — PROPOFOL 10 MG/ML IV BOLUS
INTRAVENOUS | Status: DC | PRN
  Administered 2018-01-02: 140 mg via INTRAVENOUS

## 2018-01-02 MED ORDER — GUAIFENESIN-DM 100-10 MG/5ML PO SYRP
15.0000 mL | ORAL_SOLUTION | ORAL | Status: DC | PRN
  Filled 2018-01-02: qty 15

## 2018-01-02 MED ORDER — ALBUTEROL SULFATE (2.5 MG/3ML) 0.083% IN NEBU: 2.5000 mg | INHALATION_SOLUTION | Freq: Four times a day (QID) | RESPIRATORY_TRACT | Status: DC | PRN

## 2018-01-02 MED ORDER — ALBUTEROL SULFATE HFA 108 (90 BASE) MCG/ACT IN AERS: 2.0000 | INHALATION_SPRAY | Freq: Four times a day (QID) | RESPIRATORY_TRACT | Status: DC | PRN

## 2018-01-02 MED ORDER — CALCIUM CARBONATE ANTACID 500 MG PO CHEW: 500.0000 mg | CHEWABLE_TABLET | Freq: Every day | ORAL | Status: DC

## 2018-01-02 MED ORDER — METHOCARBAMOL 500 MG PO TABS
500.0000 mg | ORAL_TABLET | Freq: Three times a day (TID) | ORAL | Status: DC | PRN
  Filled 2018-01-02: qty 1

## 2018-01-02 MED ORDER — ONDANSETRON HCL 4 MG/2ML IJ SOLN: 4.0000 mg | Freq: Once | INTRAMUSCULAR | Status: DC | PRN

## 2018-01-02 MED ORDER — CLOPIDOGREL BISULFATE 75 MG PO TABS
75.0000 mg | ORAL_TABLET | Freq: Every day | ORAL | Status: DC
  Administered 2018-01-03: 75 mg via ORAL
  Filled 2018-01-02: qty 1

## 2018-01-02 MED ORDER — FENTANYL CITRATE (PF) 100 MCG/2ML IJ SOLN
INTRAMUSCULAR | Status: AC
  Filled 2018-01-02: qty 2

## 2018-01-02 MED ORDER — SODIUM CHLORIDE 0.9 % IV SOLN
INTRAVENOUS | Status: DC
  Administered 2018-01-02: 12:00:00 via INTRAVENOUS

## 2018-01-02 MED ORDER — CARVEDILOL 3.125 MG PO TABS
3.1250 mg | ORAL_TABLET | Freq: Two times a day (BID) | ORAL | Status: DC
  Administered 2018-01-02 – 2018-01-03 (×3): 3.125 mg via ORAL
  Filled 2018-01-02 (×3): qty 1

## 2018-01-02 MED ORDER — SUGAMMADEX SODIUM 200 MG/2ML IV SOLN
INTRAVENOUS | Status: DC | PRN
  Administered 2018-01-02: 200 mg via INTRAVENOUS

## 2018-01-02 SURGICAL SUPPLY — 50 items
BLADE SURG 15 STRL LF DISP TIS (BLADE) IMPLANT
BLADE SURG 15 STRL SS (BLADE) ×3
BOOT SUTURE AID YELLOW STND (SUTURE) ×3 IMPLANT
CATH ACCU-VU SIZ PIG 5F 70CM (CATHETERS) ×2 IMPLANT
CATH BALLN CODA 9X100X32 (BALLOONS) ×2 IMPLANT
CATH BEACON 5 .035 65 KMP TIP (CATHETERS) ×3 IMPLANT
DEVICE CLOSURE PERCLS PRGLD 6F (VASCULAR PRODUCTS) IMPLANT
DEVICE PRESTO INFLATION (MISCELLANEOUS) ×3 IMPLANT
DEVICE SAFEGUARD 24CM (GAUZE/BANDAGES/DRESSINGS) ×4 IMPLANT
DEVICE TORQUE .025-.038 (MISCELLANEOUS) ×2 IMPLANT
DRYSEAL FLEXSHEATH 12FR 33CM (SHEATH) ×2
DRYSEAL FLEXSHEATH 18FR 33CM (SHEATH) ×2
ELECT CAUTERY BLADE 6.4 (BLADE) ×3 IMPLANT
ELECT REM PT RETURN 9FT ADLT (ELECTROSURGICAL) ×6
ELECTRODE REM PT RTRN 9FT ADLT (ELECTROSURGICAL) IMPLANT
EXCLUDER TNK LEG 31MX14X17 (Endovascular Graft) IMPLANT
EXCLUDER TRUNK LEG 31MX14X17 (Endovascular Graft) ×3 IMPLANT
GLIDEWIRE STIFF .35X180X3 HYDR (WIRE) ×2 IMPLANT
GLOVE BIO SURGEON STRL SZ7 (GLOVE) ×8 IMPLANT
GLOVE BIO SURGEON STRL SZ8 (GLOVE) ×2 IMPLANT
GOWN STRL REUS W/ TWL LRG LVL3 (GOWN DISPOSABLE) IMPLANT
GOWN STRL REUS W/ TWL XL LVL3 (GOWN DISPOSABLE) ×1 IMPLANT
GOWN STRL REUS W/TWL LRG LVL3 (GOWN DISPOSABLE) ×3
GOWN STRL REUS W/TWL XL LVL3 (GOWN DISPOSABLE) ×3
GRAFT EXCLUDER LEG (Endovascular Graft) ×3 IMPLANT
LEG CONTRALATERAL 16X16X13.5 (Endovascular Graft) ×3 IMPLANT
LOOP RED MAXI  1X406MM (MISCELLANEOUS) ×2
LOOP VESSEL MAXI 1X406 RED (MISCELLANEOUS) ×1 IMPLANT
LOOP VESSEL MINI 0.8X406 BLUE (MISCELLANEOUS) IMPLANT
LOOPS BLUE MINI 0.8X406MM (MISCELLANEOUS) ×2
NEEDLE ENTRY 21GA 7CM ECHOTIP (NEEDLE) ×3 IMPLANT
PACK ANGIOGRAPHY (CUSTOM PROCEDURE TRAY) ×2 IMPLANT
PERCLOSE PROGLIDE 6F (VASCULAR PRODUCTS) ×21
PREP CHG 10.5 TEAL (MISCELLANEOUS) ×2 IMPLANT
SET INTRO CAPELLA COAXIAL (SET/KITS/TRAYS/PACK) ×2 IMPLANT
SHEATH BRITE TIP 6FRX11 (SHEATH) ×4 IMPLANT
SHEATH BRITE TIP 8FRX11 (SHEATH) ×4 IMPLANT
SHEATH DRYSEAL FLEX 12FR 33CM (SHEATH) IMPLANT
SHEATH DRYSEAL FLEX 18FR 33CM (SHEATH) IMPLANT
SHIELD RADPAD SCOOP 12X17 (MISCELLANEOUS) ×4 IMPLANT
STENT GRAFT CONTRALAT 16X13.5 (Endovascular Graft) IMPLANT
SUT MNCRL 4-0 (SUTURE) ×3
SUT MNCRL 4-0 27XMFL (SUTURE) ×1
SUTURE MNCRL 4-0 27XMF (SUTURE) IMPLANT
SYR 20CC LL (SYRINGE) ×2 IMPLANT
SYR MEDRAD MARK V 150ML (SYRINGE) ×2 IMPLANT
TOWEL OR 17X26 4PK STRL BLUE (TOWEL DISPOSABLE) ×2 IMPLANT
TUBING CONTRAST HIGH PRESS 72 (TUBING) ×2 IMPLANT
WIRE AMPLATZ SSTIFF .035X260CM (WIRE) ×4 IMPLANT
WIRE J 3MM .035X145CM (WIRE) ×4 IMPLANT

## 2018-01-02 NOTE — Progress Notes (Signed)
PADs deflated completely at 1730.  No signs bleeding or oozing.  Pt with chronic back pain, usually sleeps in a recliner.  2 mgs morphine administered for left sided back pain.  He is resting quietly at this time, states pain is relieved.  He may get OOB to his recliner later.  Per Dr Lucky Cowboy his foley catheter can be removed at any time, and he can be mobile as of 1600.  PO intake WDL.  BP improved with pain control.  Pt tolerating room air w/ adequate O2 sats.

## 2018-01-02 NOTE — Progress Notes (Signed)
 Vein and Vascular Surgery  Daily Progress Note   Subjective  - Day of Surgery  Doing well.  No complaints at this point  Objective Vitals:   01/02/18 1200 01/02/18 1224 01/02/18 1300 01/02/18 1400  BP: 124/84 124/84  (!) 154/95  Pulse: 72 71 78 81  Resp: 13  18 17   Temp: 98 F (36.7 C)     TempSrc: Oral     SpO2: 93%  95% 97%  Weight: 90.6 kg     Height: 6' (1.829 m)       Intake/Output Summary (Last 24 hours) at 01/02/2018 1451 Last data filed at 01/02/2018 1400 Gross per 24 hour  Intake 1164.38 ml  Output 460 ml  Net 704.38 ml    PULM  CTAB CV  RRR VASC  Access sites C/D/I without bleeding  Laboratory CBC    Component Value Date/Time   WBC 9.7 12/25/2017 1142   HGB 16.1 12/25/2017 1142   HGB 14.9 02/27/2012 1406   HCT 46.4 12/25/2017 1142   HCT 45.2 02/27/2012 1406   PLT 166 12/25/2017 1142   PLT 196 02/27/2012 1406    BMET    Component Value Date/Time   NA 142 12/31/2017 1122   NA 145 04/23/2011 0252   K 3.9 12/31/2017 1122   K 3.9 04/23/2011 0252   CL 106 12/31/2017 1122   CL 107 04/23/2011 0252   CO2 29 12/31/2017 1122   CO2 31 04/23/2011 0252   GLUCOSE 105 (H) 12/31/2017 1122   GLUCOSE 93 04/23/2011 0252   BUN 16 12/31/2017 1122   BUN 16 04/23/2011 0252   CREATININE 1.02 12/31/2017 1122   CREATININE 1.12 10/03/2011 1625   CALCIUM 8.7 (L) 12/31/2017 1122   CALCIUM 8.6 04/23/2011 0252   GFRNONAA >60 12/31/2017 1122   GFRNONAA 68 10/03/2011 1625   GFRAA >60 12/31/2017 1122   GFRAA 78 10/03/2011 1625    Assessment/Planning: POD #0 s/p AAA repair   Doing well.  Advance diet as tolerated.  Check labs in the morning  Likely home tomorrow    [name]  01/02/2018, 2:51 PM

## 2018-01-02 NOTE — Anesthesia Postprocedure Evaluation (Signed)
Anesthesia Post Note  Patient: ATREYU MAK  Procedure(s) Performed: ENDOVASCULAR REPAIR/STENT GRAFT (N/A )  Patient location during evaluation: Cath Lab Anesthesia Type: General Level of consciousness: awake and alert Pain management: pain level controlled Vital Signs Assessment: post-procedure vital signs reviewed and stable Respiratory status: spontaneous breathing, nonlabored ventilation, respiratory function stable and patient connected to nasal cannula oxygen Cardiovascular status: blood pressure returned to baseline and stable Postop Assessment: no apparent nausea or vomiting Anesthetic complications: no     Last Vitals:  Vitals:   01/02/18 1200 01/02/18 1224  BP: 124/84 124/84  Pulse: 72 71  Resp: 13   Temp: 36.7 C   SpO2: 93%     Last Pain:  Vitals:   01/02/18 1200  TempSrc: Oral  PainSc:                  Yvette Loveless S

## 2018-01-02 NOTE — H&P (Signed)
[facility] Admission History & Physical  MRN : [id number]  [name] is a 72 y.o. (06-Feb-1946) male who presents with chief complaint of No chief complaint on file. [name]  History of Present Illness: Patient presents for his repair of his abdominal aortic aneurysm.  Has had an outpatient work-up and see those notes for full details.  Had an extended delay for cardiac clearance which was not entirely normal and he is at least moderate risk but for a stent graft it was felt he would be acceptable.  The aneurysm is large and has grown significantly, and clearly needs to be repaired.  The patient has no current symptoms today.  Current Facility-Administered Medications  Medication Dose Route Frequency Provider Last Rate Last Dose  . ceFAZolin (ANCEF) IVPB 2g/100 mL premix  2 g Intravenous On Call to OR Kris Hartmann, NP      . famotidine (PEPCID) IVPB 20 mg premix  20 mg Intravenous Once Kris Hartmann, NP 100 mL/hr at 01/02/18 0720 20 mg at 01/02/18 0720  . HYDROmorphone (DILAUDID) injection 1 mg  1 mg Intravenous Once PRN Kris Hartmann, NP      . lactated ringers infusion   Intravenous Continuous Gunnar Bulla, MD 75 mL/hr at 01/02/18 0715    . ondansetron (ZOFRAN) injection 4 mg  4 mg Intravenous Q6H PRN Kris Hartmann, NP        Past Medical History:  Diagnosis Date  . AAA (abdominal aortic aneurysm) ([place])    a.  CTA 7/19: measured 4.5 x 5.3 cm and greatest transverse dimensions  . Abnormal nuclear stress test    a.  Myoview 2012: anterior wall ischemia with an estimated EF of 26%.  This was a new wall motion abnormality as well as a newly reduced EF  . COPD (chronic obstructive pulmonary disease) ([place])   . Coronary artery disease    a.  Posterior MI in 2002 status post BMS to the LCx; b. [place] 2012: 95% stenosis of the proximal LAD, 95% stenosis of the mid LAD, 30% in-stent restenosis of the mid left circumflex with a second lesion of diffuse 50% stenosis.   The patient underwent successful PCI/BMS to the mid LAD with 0% residual stenosis, LV gram not performed  . GERD (gastroesophageal reflux disease)   . History of kidney stones   . History of prostate cancer   . Hyperlipidemia   . Hypertension   . Myocardial infarction ([place])   . Neuromuscular disorder ([place])   . Prostate cancer ([place])    a.  Status post seeding  . PVD (peripheral vascular disease) ([place])   . Stroke [facility])     Past Surgical History:  Procedure Laterality Date  . BACK SURGERY  2009/2010   x 2  . CARDIAC CATHETERIZATION  2002   stent placement [place]  . FOOT SURGERY     bilateral   . HERNIA REPAIR     bilateral   . KNEE SURGERY     right knee   . PROSTATE SURGERY  09/2009   prostate implant  . SPINE SURGERY      Family History  Problem Relation Age of Onset  . Heart attack Mother   . Diabetes Mother   . Heart disease Father   no bleeding or clotting disorders No aneurysms  Social History Social History        Tobacco Use  . Smoking status: Current Every Day Smoker    Packs/day:  1.00    Years: 56.00    Pack years: 56.00    Types: Cigarettes  . Smokeless tobacco: Never Used  . Tobacco comment: back to smoking X1 year  Substance Use Topics  . Alcohol use: No    Alcohol/week: 0.0 oz  . Drug use: No  Married        Allergies  Allergen Reactions  . Lyrica [Pregabalin]     Swelling   . Prednisone     Feels like having a heart attack. Has had the injection form before and does well.      REVIEW OF SYSTEMS (Negative unless checked)  Constitutional: [] Weight loss  [] Fever  [] Chills Cardiac: [] Chest pain   [] Chest pressure   [x] Palpitations   [] Shortness of breath when laying flat   [] Shortness of breath at rest   [x] Shortness of breath with exertion. Vascular:  [] Pain in legs with walking   [] Pain in legs at rest   [] Pain in legs when laying flat   [] Claudication   [] Pain in feet when walking  [] Pain in feet at  rest  [] Pain in feet when laying flat   [] History of DVT   [] Phlebitis   [] Swelling in legs   [] Varicose veins   [] Non-healing ulcers Pulmonary:   [] Uses home oxygen   [] Productive cough   [] Hemoptysis   [] Wheeze  [x] COPD   [] Asthma Neurologic:  [] Dizziness  [] Blackouts   [] Seizures   [] History of stroke   [] History of TIA  [] Aphasia   [] Temporary blindness   [] Dysphagia   [] Weakness or numbness in arms   [] Weakness or numbness in legs Musculoskeletal:  [x] Arthritis   [] Joint swelling   [x] Joint pain   [x] Low back pain Hematologic:  [] Easy bruising  [] Easy bleeding   [] Hypercoagulable state   [] Anemic  [] Hepatitis Gastrointestinal:  [] Blood in stool   [] Vomiting blood  [x] Gastroesophageal reflux/heartburn   [] Abdominal pain Genitourinary:  [] Chronic kidney disease   [] Difficult urination  [] Frequent urination  [] Burning with urination   [] Hematuria Skin:  [] Rashes   [] Ulcers   [] Wounds Psychological:  [] History of anxiety   []  History of major depression.    Physical Examination  Vitals:   01/02/18 0638  BP: (!) 173/96  Pulse: 69  Resp: (!) 22  Temp: 97.8 F (36.6 C)  TempSrc: Oral  SpO2: 94%  Weight: 89.4 kg  Height: 6' (1.829 m)   Body mass index is 26.73 kg/m. Gen: WD/WN, NAD Head: [place]/AT, No temporalis wasting.  Ear/Nose/Throat: Hearing grossly intact, nares w/o erythema or drainage, oropharynx w/o Erythema/Exudate,  Eyes: Conjunctiva clear, sclera non-icteric Neck: Trachea midline.  No JVD.  Pulmonary:  Good air movement, respirations not labored, no use of accessory muscles.  Cardiac: Irregular Vascular:  Vessel Right Left  Radial Palpable Palpable                                   Gastrointestinal: soft, non-tender/non-distended.  Increased aortic impulse Musculoskeletal: M/S 5/5 throughout.  Extremities without ischemic changes.  No deformity or atrophy.  Neurologic: Sensation grossly intact in extremities.  Symmetrical.  Speech is fluent. Motor exam as listed  above. Psychiatric: Judgment intact, Mood & affect appropriate for pt's clinical situation. Dermatologic: No rashes or ulcers noted.  No cellulitis or open wounds.      CBC Lab Results  Component Value Date   WBC 9.7 12/25/2017   HGB 16.1 12/25/2017   HCT 46.4 12/25/2017  MCV 93.6 12/25/2017   PLT 166 12/25/2017    BMET    Component Value Date/Time   NA 142 12/31/2017 1122   NA 145 04/23/2011 0252   K 3.9 12/31/2017 1122   K 3.9 04/23/2011 0252   CL 106 12/31/2017 1122   CL 107 04/23/2011 0252   CO2 29 12/31/2017 1122   CO2 31 04/23/2011 0252   GLUCOSE 105 (H) 12/31/2017 1122   GLUCOSE 93 04/23/2011 0252   BUN 16 12/31/2017 1122   BUN 16 04/23/2011 0252   CREATININE 1.02 12/31/2017 1122   CREATININE 1.12 10/03/2011 1625   CALCIUM 8.7 (L) 12/31/2017 1122   CALCIUM 8.6 04/23/2011 0252   GFRNONAA >60 12/31/2017 1122   GFRNONAA 68 10/03/2011 1625   GFRAA >60 12/31/2017 1122   GFRAA 78 10/03/2011 1625   Estimated Creatinine Clearance: 69.7 mL/min (by C-G formula based on SCr of 1.02 mg/dL).  COAG Lab Results  Component Value Date   INR 0.99 12/25/2017   INR 1.06 01/03/2011   INR 1.09 09/22/2009    Radiology Nm Myocar Multi W/spect W/wall Motion / Ef  Result Date: 12/21/2017  T wave inversion was noted during stress in the V5 and V6 leads.  There was no ST segment deviation noted during stress.  Defect 1: There is a small defect of moderate severity present in the basal inferior and apex location.  Findings consistent with prior myocardial infarction.  This is a high risk study.  Nuclear stress EF: 16%.      Assessment/Plan Essential hypertension blood pressure control important in reducing the progression of atherosclerotic disease. On appropriate oral medications.   CAD, NATIVE VESSEL Follows with cardiology. Stress test shows defects but for stent graft Dr. Rockey Situ feels this patient is acceptable risk  Hyperlipidemia lipid control important in  reducing the progression of atherosclerotic disease. Continue statin therapy  AAA (abdominal aortic aneurysm) without rupture (HCC) Aneurysm is well over 5 cm in maximal diameter by CT scan and has acceptable anatomy for endograft repair.  Risks and benefits are discussed with the patient and he is agreeable to proceed.  Leotis Pain, MD  01/02/2018 7:39 AM

## 2018-01-02 NOTE — Anesthesia Post-op Follow-up Note (Signed)
Anesthesia QCDR form completed.        

## 2018-01-02 NOTE — Plan of Care (Signed)
Pt and family oriented to ICU, diet tolerated, no pain issues at this time

## 2018-01-02 NOTE — Transfer of Care (Signed)
Immediate Anesthesia Transfer of Care Note  Patient: [name]  Procedure(s) Performed: ENDOVASCULAR REPAIR/STENT GRAFT (N/A )  Patient Location: PACU  Anesthesia Type:General  Level of Consciousness: awake and alert   Airway & Oxygen Therapy: Patient Spontanous Breathing and Patient connected to face mask oxygen  Post-op Assessment: Report given to RN and Post -op Vital signs reviewed and stable  Post vital signs: Reviewed and stable  Last Vitals:  Vitals Value Taken Time  BP 192/108 01/02/2018 10:01 AM  Temp    Pulse 74 01/02/2018 10:03 AM  Resp 19 01/02/2018 10:03 AM  SpO2 99 % 01/02/2018 10:03 AM  Vitals shown include unvalidated device data.  Last Pain:  Vitals:   01/02/18 0349  TempSrc: Oral  PainSc: 5          Complications: No apparent anesthesia complications

## 2018-01-02 NOTE — Op Note (Signed)
OPERATIVE NOTE   PROCEDURE: 1. US guidance for vascular access, bilateral femoral arteries 2. Catheter placement into aorta from bilateral femoral approaches 3. Placement of a C3 31 x 14 x 17 Gore Excluder Endoprosthesis main body with a 16 x 14 contralateral limb 4. Placement of a 14 x 10 Gore Excluder extender limb left common iliac 5. ProGlide closure devices bilateral femoral arteries  PRE-OPERATIVE DIAGNOSIS: AAA  POST-OPERATIVE DIAGNOSIS: same  SURGEON: Hortencia Pilar, MD and Leotis Pain, MD - Co-surgeons  ANESTHESIA: general  ESTIMATED BLOOD LOSS: 100 cc  FINDING(S): 1.  AAA  SPECIMEN(S):  none  INDICATIONS:   [name] is a 72 y.o. y.o. male who presents with a large abdominal aortic aneurysm.  He is suitable for endovascular repair.  He is undergoing stent grafting of his abdominal aortic aneurysm to prevent lethal rupture.  The risks and benefits have been discussed with the patient and he wishes to proceed with repair of his aneurysm.  DESCRIPTION: After obtaining full informed written consent, the patient was brought back to the operating room and placed supine upon the operating table.  The patient received IV antibiotics prior to induction.  After obtaining adequate anesthesia, the patient was prepped and draped in the standard fashion for endovascular AAA repair.  Co-surgeons are required as much of the work is being performed simultaneously from both the right and wrapped left femoral access ease and this expedite the procedure and reduce his operative time benefiting the patient.  We then began by gaining access to both femoral arteries with US guidance with me working on the patient's right and Dr. Lucky Cowboy working on the patient's left.  The femoral arteries were found to be patent and accessed without difficulty with a needle under ultrasound guidance without difficulty on each side and permanent images were recorded.  We then placed 2 proglide devices on each  side in a pre-close fashion and placed 8 French sheaths.  The patient was then given 5000 units of intravenous heparin.   The Pigtail catheter was placed into the aorta from the right side. Using this image, we selected a 31 x 14 x 17 Main body device.  Over a stiff wire, an 9 French sheath was placed. The main body was then placed through the 18 French sheath. A Kumpe catheter was placed up the right side and a magnified image at the renal arteries was performed. The main body was then deployed just below the lowest renal artery. The Kumpe catheter was used to cannulate the contralateral gate without difficulty and successful cannulation was confirmed by twirling the pigtail catheter in the main body. We then placed a stiff wire and a retrograde arteriogram was performed through the right femoral sheath. We upsized to the 12 [place] sheath for the contralateral limb and a 16 x 14 limb was selected and deployed. The main body deployment was then completed.  Hand-injection contrast was then performed through the left femoral sheath and an RAO projection.  Based off the angiographic findings, and extension limb was necessary on the left.  A 14 x 10 Gore excluder extender limb was opened onto the field and advanced up the left side.  It was deployed without difficulty landing the distal and just above the iliac bifurcation. All junction points and seals zones were treated with the compliant balloon.   The pigtail catheter was then replaced and a completion angiogram was performed.   Excellent seal proximally distally was noted.  No endoleak was  detected on completion angiography. The renal arteries were found to be widely patent.   At this point we elected to terminate the procedure. We secured the pro glide devices for hemostasis on the femoral arteries. The skin incision was closed with a 4-0 Monocryl. Dermabond and pressure dressing were placed. The patient was taken to the recovery room in stable condition  having tolerated the procedure well.  COMPLICATIONS: none  CONDITION: stable  Hortencia Pilar  01/02/2018, 9:41 AM

## 2018-01-02 NOTE — Anesthesia Preprocedure Evaluation (Addendum)
Anesthesia Evaluation  Patient identified by MRN, date of birth, ID band Patient awake    Reviewed: Allergy & Precautions, NPO status , Patient's Chart, lab work & pertinent test results, reviewed documented beta blocker date and time   Airway Mallampati: II  TM Distance: >3 FB     Dental  (+) Upper Dentures, Lower Dentures   Pulmonary COPD, Current Smoker,           Cardiovascular hypertension, Pt. on medications and Pt. on home beta blockers + CAD, + Past MI, + Cardiac Stents and + Peripheral Vascular Disease       Neuro/Psych  Neuromuscular disease CVA, No Residual Symptoms    GI/Hepatic GERD  Controlled,  Endo/Other    Renal/GU      Musculoskeletal  (+) Arthritis ,   Abdominal   Peds  Hematology   Anesthesia Other Findings Smokes. Saturations run low - 91%. EF in the past 25. New EF 30-35.  Reproductive/Obstetrics                           Anesthesia Physical Anesthesia Plan  ASA: III  Anesthesia Plan: General   Post-op Pain Management:    Induction: Intravenous  PONV Risk Score and Plan:   Airway Management Planned: Oral ETT  Additional Equipment:   Intra-op Plan:   Post-operative Plan:   Informed Consent: I have reviewed the patients History and Physical, chart, labs and discussed the procedure including the risks, benefits and alternatives for the proposed anesthesia with the patient or authorized representative who has indicated his/her understanding and acceptance.     Plan Discussed with: CRNA  Anesthesia Plan Comments:         Anesthesia Quick Evaluation

## 2018-01-02 NOTE — Op Note (Signed)
OPERATIVE NOTE   PROCEDURE: 1. US guidance for vascular access, bilateral femoral arteries 2. Catheter placement into aorta from bilateral femoral approaches 3. Placement of a 31 mm diameter proximal, 17 cm length Gore Excluder Endoprosthesis main body left with a 16 mm by 14 cm right contralateral limb 4. Placement of a 14 mm by 10 cm left iliac extension limb 5. ProGlide closure devices bilateral femoral arteries  PRE-OPERATIVE DIAGNOSIS: AAA  POST-OPERATIVE DIAGNOSIS: same  SURGEON: Leotis Pain, MD and Hortencia Pilar, MD - Co-surgeons  ANESTHESIA: general  ESTIMATED BLOOD LOSS: 100 cc  FINDING(S): 1.  AAA  SPECIMEN(S):  none  INDICATIONS:   [name] is a 72 y.o. male who presents with >5 cm AAA. The anatomy was suitable for endovascular repair.  Risks and benefits of repair in an endovascular fashion were discussed and informed consent was obtained.  Co-surgeons are used to expedite the procedure and reduce operative time as bilateral work needs to be done.  DESCRIPTION: After obtaining full informed written consent, the patient was brought back to the operating room and placed supine upon the operating table.  The patient received IV antibiotics prior to induction.  After obtaining adequate anesthesia, the patient was prepped and draped in the standard fashion for endovascular AAA repair.  We then began by gaining access to both femoral arteries with US guidance with me working on the left and Dr. Delana Meyer working on the right.  The femoral arteries were found to be patent and accessed without difficulty with a needle under ultrasound guidance without difficulty on each side and permanent images were recorded.  We then placed 2 proglide devices on each side in a pre-close fashion and placed 8 French sheaths. The patient was then given 5000 units of intravenous heparin. The Pigtail catheter was placed into the aorta from the right side. Using this image, we selected a 31 mm  proximal, 14 mm distal, 17 cm length Main body device.  Over a stiff wire, an 74 French sheath was placed up the left side. The main body was then placed through the 18 French sheath. A Kumpe catheter was placed up the right side and a magnified image at the renal arteries was performed with cranial projection to reduce parallax. The main body was then deployed just below the lowest renal artery which was the right. The Kumpe catheter was used to cannulate the contralateral gate without difficulty and successful cannulation was confirmed by twirling the pigtail catheter in the main body. We then placed a stiff wire and a retrograde arteriogram was performed through the right femoral sheath. We upsized to the 12 [place] sheath for the contralateral limb on the right and a 16 mm diameter by 14 cm length contralateral right limb was selected and deployed. The main body deployment was then completed. Based off the angiographic findings, extension limbs were necessary.  A 14 mm diameter by 10 cm length left iliac extension limb was taken down to just above the hypogastric artery. All junction points and seals zones were treated with the compliant balloon. The pigtail catheter was then replaced and a completion angiogram was performed.  No endoleak was detected on completion angiography. The renal arteries were found to be widely patent.  Both hypogastric arteries appeared to be patent. At this point we elected to terminate the procedure. We secured the pro glide devices for hemostasis on the femoral arteries. The skin incision was closed with a 4-0 Monocryl. Dermabond and pressure dressing were  placed. The patient was taken to the recovery room in stable condition having tolerated the procedure well.  COMPLICATIONS: none  CONDITION: stable  Leotis Pain  01/02/2018, 9:42 AM   This note was created with Dragon Medical transcription system. Any errors in dictation are purely unintentional.

## 2018-01-02 NOTE — Anesthesia Procedure Notes (Addendum)
Procedure Name: Intubation Date/Time: 01/02/2018 7:56 AM Performed by: Womer, Rex Kras, RN Pre-anesthesia Checklist: Patient identified, Emergency Drugs available, Suction available, Patient being monitored and Timeout performed Patient Re-evaluated:Patient Re-evaluated prior to induction Oxygen Delivery Method: Circle system utilized Preoxygenation: Pre-oxygenation with 100% oxygen Induction Type: IV induction Ventilation: Two handed mask ventilation required Laryngoscope Size: McGraph and 4 Grade View: Grade I Tube type: Oral Tube size: 7.5 mm Number of attempts: 1 Airway Equipment and Method: Stylet and Video-laryngoscopy Placement Confirmation: positive ETCO2 and breath sounds checked- equal and bilateral Secured at: 22 cm Tube secured with: Tape Dental Injury: Teeth and Oropharynx as per pre-operative assessment

## 2018-01-03 LAB — CBC
HEMATOCRIT: 41.5 % (ref 40.0–52.0)
Hemoglobin: 14.2 g/dL (ref 13.0–18.0)
MCH: 31.7 pg (ref 26.0–34.0)
MCHC: 34.1 g/dL (ref 32.0–36.0)
MCV: 92.8 fL (ref 80.0–100.0)
PLATELETS: 156 10*3/uL (ref 150–440)
RBC: 4.47 MIL/uL (ref 4.40–5.90)
RDW: 13.1 % (ref 11.5–14.5)
WBC: 15.5 10*3/uL — AB (ref 3.8–10.6)

## 2018-01-03 LAB — BASIC METABOLIC PANEL
ANION GAP: 6 (ref 5–15)
BUN: 13 mg/dL (ref 8–23)
CALCIUM: 8.3 mg/dL — AB (ref 8.9–10.3)
CO2: 28 mmol/L (ref 22–32)
Chloride: 107 mmol/L (ref 98–111)
Creatinine, Ser: 1.05 mg/dL (ref 0.61–1.24)
GLUCOSE: 116 mg/dL — AB (ref 70–99)
POTASSIUM: 3.8 mmol/L (ref 3.5–5.1)
Sodium: 141 mmol/L (ref 135–145)

## 2018-01-03 LAB — MAGNESIUM: Magnesium: 1.7 mg/dL (ref 1.7–2.4)

## 2018-01-03 MED ORDER — CLOPIDOGREL BISULFATE 75 MG PO TABS: 75.0000 mg | ORAL_TABLET | Freq: Every day | ORAL | 0 refills | Status: DC

## 2018-01-03 MED ORDER — FAMOTIDINE 20 MG PO TABS: 20.0000 mg | ORAL_TABLET | Freq: Two times a day (BID) | ORAL | Status: DC

## 2018-01-03 MED ORDER — TRAMADOL HCL 50 MG PO TABS: 50.0000 mg | ORAL_TABLET | Freq: Four times a day (QID) | ORAL | 0 refills | Status: DC | PRN

## 2018-01-03 NOTE — Plan of Care (Signed)
Discharged to home  Femoral sites clean and dry.  PIVs DC, bleeding controlled.  Scrips provided.  DC education and f/u appt provided, understanding verbalized.  Pt left hospital in car with his family

## 2018-01-03 NOTE — Discharge Instructions (Signed)
You may shower as of Friday. Keep groins clean and dry. Please do not bathe or submerge in water until cleared by your doctor. No heavy lifting, greater than 10 pounds for at least a month.  No driving while on pain medication.

## 2018-01-03 NOTE — Discharge Summary (Signed)
[facility]    Discharge Summary  Patient ID:  [name] MRN: [id number] DOB/AGE: 74-May-1947 75 y.o.  Admit date: 01/02/2018 Discharge date: 01/03/2018 Date of Surgery: 01/02/2018 Surgeon: Surgeon(s): Dew, Erskine Squibb, MD Schnier, Dolores Lory, MD  Admission Diagnosis: AAA (abdominal aortic aneurysm) without rupture [facility]) [I71.4]  Discharge Diagnoses:  AAA (abdominal aortic aneurysm) without rupture [facility]) [I71.4]  Secondary Diagnoses: Past Medical History:  Diagnosis Date  . AAA (abdominal aortic aneurysm) ([place])    a.  CTA 7/19: measured 4.5 x 5.3 cm and greatest transverse dimensions  . Abnormal nuclear stress test    a.  Myoview 2012: anterior wall ischemia with an estimated EF of 26%.  This was a new wall motion abnormality as well as a newly reduced EF  . COPD (chronic obstructive pulmonary disease) ([place])   . Coronary artery disease    a.  Posterior MI in 2002 status post BMS to the LCx; b. [place] 2012: 95% stenosis of the proximal LAD, 95% stenosis of the mid LAD, 30% in-stent restenosis of the mid left circumflex with a second lesion of diffuse 50% stenosis.  The patient underwent successful PCI/BMS to the mid LAD with 0% residual stenosis, LV gram not performed  . GERD (gastroesophageal reflux disease)   . History of kidney stones   . History of prostate cancer   . Hyperlipidemia   . Hypertension   . Myocardial infarction ([place])   . Neuromuscular disorder ([place])   . Prostate cancer ([place])    a.  Status post seeding  . PVD (peripheral vascular disease) ([place])   . Stroke [facility])    Procedure(s): ENDOVASCULAR REPAIR/STENT GRAFT  Discharged Condition: good  HPI:  [name] is a 72 year old male who presents with >5 cm AAA. The anatomy was suitable for endovascular repair. On 01/02/18, the patient underwent a US guidance for vascular access, bilateral femoral arteries, Catheter placement into aorta from bilateral femoral approaches, Placement of a 31 mm  diameter proximal, 17 cm length Gore Excluder Endoprosthesis main body left with a 16 mm by 14 cm right contralateral limb, Placement of a 14 mm by 10 cm left iliac extension limb with ProGlide closure devices bilateral femoral arteries. The patient tolerated the procedure well and was transferred to the ICU for observation overnight. His night of surgery was unremarkable. During his brief inpatient stay, the patients diet was advanced, he was urinating without complication, tolerating a regular diet and ambulating independently.   Hospital Course:  [name] is a 72 y.o. male is S/P:  Procedure(s): ENDOVASCULAR REPAIR/STENT GRAFT  Extubated: POD # 0  Physical exam:  A&Ox3, NAD CV: RRR Pulm: CTA Bilaterally Abdomen: Soft, Non-tender, Non-distended, (+) Bowel Sounds Groins: No swelling, ecchymosis or drainage. Healing well.  Extremities: Warm, Non-tender, Palpable pedal pulses, feet warm  Post-op wounds clean, dry, intact or healing well  Pt. Ambulating, voiding and taking PO diet without difficulty.  Pt pain controlled with PO pain meds.  Labs as below  Complications:none  Consults: None  Significant Diagnostic Studies: CBC Lab Results  Component Value Date   WBC 15.5 (H) 01/03/2018   HGB 14.2 01/03/2018   HCT 41.5 01/03/2018   MCV 92.8 01/03/2018   PLT 156 01/03/2018   BMET    Component Value Date/Time   NA 141 01/03/2018 0543   NA 145 04/23/2011 0252   K 3.8 01/03/2018 0543   K 3.9 04/23/2011 0252   CL 107 01/03/2018 0543  CL 107 04/23/2011 0252   CO2 28 01/03/2018 0543   CO2 31 04/23/2011 0252   GLUCOSE 116 (H) 01/03/2018 0543   GLUCOSE 93 04/23/2011 0252   BUN 13 01/03/2018 0543   BUN 16 04/23/2011 0252   CREATININE 1.05 01/03/2018 0543   CREATININE 1.12 10/03/2011 1625   CALCIUM 8.3 (L) 01/03/2018 0543   CALCIUM 8.6 04/23/2011 0252   GFRNONAA >60 01/03/2018 0543   GFRNONAA 68 10/03/2011 1625   GFRAA >60 01/03/2018 0543   GFRAA 78 10/03/2011  1625   COAG Lab Results  Component Value Date   INR 0.99 12/25/2017   INR 1.06 01/03/2011   INR 1.09 09/22/2009   Disposition:  Discharge to :Home  Allergies as of 01/03/2018      Reactions   Lyrica [pregabalin]    Swelling   Prednisone    Feels like having a heart attack. Has had the injection form before and does well.      Medication List    TAKE these medications   albuterol 108 (90 Base) MCG/ACT inhaler Commonly known as:  PROVENTIL HFA;VENTOLIN HFA Inhale 2 puffs into the lungs every 6 (six) hours as needed for wheezing.   aspirin 81 MG tablet Take 81 mg by mouth daily.   atorvastatin 80 MG tablet Commonly known as:  LIPITOR Take 1 tablet (80 mg total) by mouth daily.   budesonide-formoterol 160-4.5 MCG/ACT inhaler Commonly known as:  SYMBICORT Inhale 2 puffs into the lungs 2 (two) times daily.   CALCIUM PO Take 1 tablet by mouth daily.   carvedilol 3.125 MG tablet Commonly known as:  COREG Take 1 tablet (3.125 mg total) by mouth 2 (two) times daily.   clopidogrel 75 MG tablet Commonly known as:  PLAVIX Take 1 tablet (75 mg total) by mouth daily with breakfast. Only take daily for one month Start taking on:  01/04/2018   methocarbamol 500 MG tablet Commonly known as:  ROBAXIN Take 1 tablet (500 mg total) by mouth 2 (two) times daily. What changed:    how much to take  when to take this  reasons to take this   multivitamin with minerals tablet Take 1 tablet by mouth daily.   omeprazole 40 MG capsule Commonly known as:  PRILOSEC TAKE 1 CAPSULE BY MOUTH DAILY USUALLY 30MINUTES BEFORE BREAKFAST   polyethylene glycol powder powder Commonly known as:  GLYCOLAX/MIRALAX Take 17 g by mouth as needed.   sacubitril-valsartan 24-26 MG Commonly known as:  ENTRESTO Take 1 tablet by mouth 2 (two) times daily.   traMADol 50 MG tablet Commonly known as:  ULTRAM Take 1 tablet (50 mg total) by mouth every 6 (six) hours as needed for moderate pain or  severe pain. What changed:    when to take this  reasons to take this      Verbal and written Discharge instructions given to the patient. Wound care per Discharge AVS Follow-up Information    Keveon Amsler, Joelene Millin A, PA-C Follow up in 1 week(s).   Specialty:  Vascular Surgery Why:  First post-op check. AAA. Incision check. No studies.  January 10, 2018 at 9 O'Clock AM Contact information: [place] [address] [phone]        Sela Hua, PA-C.   Specialty:  Vascular Surgery Why:  SEPT. 26TH 2019 AT 09:00AM Contact information: [place] [address] [phone]          Signed: Sela Hua, PA-C  01/03/2018, 1:38 PM

## 2018-01-03 NOTE — Progress Notes (Signed)
PHARMACIST - PHYSICIAN COMMUNICATION  CONCERNING: IV to Oral Route Change Policy  RECOMMENDATION: This patient is receiving famotidine by the intravenous route.  Based on criteria approved by the Pharmacy and Therapeutics Committee, the intravenous medication(s) is/are being converted to the equivalent oral dose form(s).   DESCRIPTION: These criteria include:  The patient is eating (either orally or via tube) and/or has been taking other orally administered medications for a least 24 hours  The patient has no evidence of active gastrointestinal bleeding or impaired GI absorption (gastrectomy, short bowel, patient on TNA or NPO).  If you have questions about this conversion, please contact the Pharmacy Department  []   [phone] )  [facility] [x]   [phone] )  [facility] []   [phone] )  [facility] []   [phone] )  [facility] []   [phone] )  [place], [facility] 01/03/2018 9:53 AM

## 2018-01-09 ENCOUNTER — Ambulatory Visit: Payer: Medicare Other | Admitting: Cardiovascular Disease

## 2018-01-10 ENCOUNTER — Ambulatory Visit (INDEPENDENT_AMBULATORY_CARE_PROVIDER_SITE_OTHER): Payer: Medicare Other | Admitting: Vascular Surgery

## 2018-01-10 ENCOUNTER — Encounter (INDEPENDENT_AMBULATORY_CARE_PROVIDER_SITE_OTHER): Payer: Self-pay | Admitting: Vascular Surgery

## 2018-01-10 VITALS — BP 153/89 | HR 67 | Resp 16 | Ht 70.0 in | Wt 197.0 lb

## 2018-01-10 DIAGNOSIS — F17210 Nicotine dependence, cigarettes, uncomplicated: Secondary | ICD-10-CM

## 2018-01-10 DIAGNOSIS — E782 Mixed hyperlipidemia: Secondary | ICD-10-CM

## 2018-01-10 DIAGNOSIS — I714 Abdominal aortic aneurysm, without rupture, unspecified: Secondary | ICD-10-CM

## 2018-01-10 DIAGNOSIS — I10 Essential (primary) hypertension: Secondary | ICD-10-CM

## 2018-01-10 NOTE — Progress Notes (Signed)
Subjective:    Patient ID: [name], male    DOB: [birth year], 72 y.o.   MRN: [id number] Chief Complaint  Patient presents with  . Follow-up    1 week [facility] follow up   The patient presents for his first postoperative follow-up.  The patient is status post a endovascular abdominal aortic aneurysm repair on January 02, 2018.  The patient's postoperative course has been unremarkable.  Patient presents today without complaint with a section of some constipation.  The patient is not taking any type of narcotic pain medication.  The patient does note some bruising to the bilateral groins.  The patient denies any abdominal pain, back pain or thrombosis to the bilateral lower extremity.  The patient denies any other issues with his groin access sites aside from the bruising.  The patient is passing gas even though he is constipated which seems to be a chronic condition for him.  The patient denies any fever, nausea vomiting.  Review of Systems  Constitutional: Negative.   HENT: Negative.   Eyes: Negative.   Respiratory: Negative.   Cardiovascular: Negative.   Gastrointestinal: Negative.   Endocrine: Negative.   Genitourinary: Negative.   Musculoskeletal: Negative.   Skin: Negative.   Allergic/Immunologic: Negative.   Neurological: Negative.   Hematological: Negative.   Psychiatric/Behavioral: Negative.       Objective:   Physical Exam  Constitutional: He is oriented to person, place, and time. He appears well-developed and well-nourished. No distress.  HENT:  Head: Normocephalic and atraumatic.  Right Ear: External ear normal.  Left Ear: External ear normal.  Eyes: Pupils are equal, round, and reactive to light. Conjunctivae and EOM are normal.  Neck: Normal range of motion.  Cardiovascular: Normal rate, regular rhythm, normal heart sounds and intact distal pulses.  Pulmonary/Chest: Effort normal and breath sounds normal.  Abdominal: Soft. Bowel sounds are normal. He exhibits  no distension. There is no tenderness. There is no guarding.  Musculoskeletal: Normal range of motion. He exhibits no edema.  Neurological: He is alert and oriented to person, place, and time.  Skin: Skin is warm and dry. He is not diaphoretic.  Some mild bruising to the bilateral groins, left greater than right.  Access sites are closed and healed.  Thigh is soft.  No palpable pulsatile masses.  Psychiatric: He has a normal mood and affect. His behavior is normal. Judgment and thought content normal.  Vitals reviewed.  BP (!) 153/89 (BP Location: Right Arm, Patient Position: Sitting)   Pulse 67   Resp 16   Ht 5\' 10"  (1.778 m)   Wt 197 lb (89.4 kg)   BMI 28.27 kg/m   Past Medical History:  Diagnosis Date  . AAA (abdominal aortic aneurysm) ([place])    a.  CTA 7/19: measured 4.5 x 5.3 cm and greatest transverse dimensions  . Abnormal nuclear stress test    a.  Myoview 2012: anterior wall ischemia with an estimated EF of 26%.  This was a new wall motion abnormality as well as a newly reduced EF  . COPD (chronic obstructive pulmonary disease) ([place])   . Coronary artery disease    a.  Posterior MI in 2002 status post BMS to the LCx; b. [place] 2012: 95% stenosis of the proximal LAD, 95% stenosis of the mid LAD, 30% in-stent restenosis of the mid left circumflex with a second lesion of diffuse 50% stenosis.  The patient underwent successful PCI/BMS to the mid LAD with 0% residual stenosis, LV gram  not performed  . GERD (gastroesophageal reflux disease)   . History of kidney stones   . History of prostate cancer   . Hyperlipidemia   . Hypertension   . Myocardial infarction ([place])   . Neuromuscular disorder ([place])   . Prostate cancer ([place])    a.  Status post seeding  . PVD (peripheral vascular disease) ([place])   . Stroke [facility])    Social History   Socioeconomic History  . Marital status: Married    Spouse name: Not on file  . Number of children: Not on file  . Years of education: Not on file    . Highest education level: Not on file  Occupational History  . Not on file  Social Needs  . Financial resource strain: Not on file  . Food insecurity:    Worry: Not on file    Inability: Not on file  . Transportation needs:    Medical: Not on file    Non-medical: Not on file  Tobacco Use  . Smoking status: Current Every Day Smoker    Packs/day: 0.25    Years: 56.00    Pack years: 14.00    Types: Cigarettes  . Smokeless tobacco: Never Used  . Tobacco comment: back to smoking X1 year  Substance and Sexual Activity  . Alcohol use: No    Alcohol/week: 0.0 standard drinks  . Drug use: No  . Sexual activity: Not on file  Lifestyle  . Physical activity:    Days per week: Not on file    Minutes per session: Not on file  . Stress: Not on file  Relationships  . Social connections:    Talks on phone: Not on file    Gets together: Not on file    Attends religious service: Not on file    Active member of club or organization: Not on file    Attends meetings of clubs or organizations: Not on file    Relationship status: Not on file  . Intimate partner violence:    Fear of current or ex partner: Not on file    Emotionally abused: Not on file    Physically abused: Not on file    Forced sexual activity: Not on file  Other Topics Concern  . Not on file  Social History Narrative  . Not on file   Past Surgical History:  Procedure Laterality Date  . BACK SURGERY  2009/2010   x 2  . CARDIAC CATHETERIZATION  2002   stent placement [place]  . ENDOVASCULAR REPAIR/STENT GRAFT N/A 01/02/2018   Procedure: ENDOVASCULAR REPAIR/STENT GRAFT;  Surgeon: Algernon Huxley, MD;  Location: [facility];  Service: Cardiovascular;  Laterality: N/A;  . FOOT SURGERY     bilateral   . HERNIA REPAIR     bilateral   . KNEE SURGERY     right knee   . PROSTATE SURGERY  09/2009   prostate implant  . SPINE SURGERY     Family History  Problem Relation Age of Onset  . Heart attack Mother   .  Diabetes Mother   . Heart disease Father    Allergies  Allergen Reactions  . Lyrica [Pregabalin]     Swelling   . Prednisone     Feels like having a heart attack. Has had the injection form before and does well.      Assessment & Plan:  The patient presents for his first postoperative follow-up.  The patient is status post a  endovascular abdominal aortic aneurysm repair on January 02, 2018.  The patient's postoperative course has been unremarkable.  Patient presents today without complaint with a section of some constipation.  The patient is not taking any type of narcotic pain medication.  The patient does note some bruising to the bilateral groins.  The patient denies any abdominal pain, back pain or thrombosis to the bilateral lower extremity.  The patient denies any other issues with his groin access sites aside from the bruising.  The patient is passing gas even though he is constipated which seems to be a chronic condition for him.  The patient denies any fever, nausea vomiting.  1. AAA (abdominal aortic aneurysm) without rupture ([facility]) - Stable The patient is status post a endovascular abdominal aortic aneurysm repair on 01/02/18. His postoperative course has been unremarkable Patient continued to have issues with constipation which seems to be a chronic issue for him The patient does experience some bruising to the bilateral groins over the access sites are healing well He denies any abdominal pain, back pain or thrombosis to bilateral lower extremities Overall he is doing well postoperatively. The patient is to follow-up in approximately 1 month for an EVAR  - VAS [place] EVAR DUPLEX; Future  2. Essential hypertension - Stable Encouraged good control as its slows the progression of atherosclerotic disease  3. Mixed hyperlipidemia - Stable Encouraged good control as its slows the progression of atherosclerotic disease  Current Outpatient Medications on File Prior to Visit    Medication Sig Dispense Refill  . albuterol (PROVENTIL HFA;VENTOLIN HFA) 108 (90 Base) MCG/ACT inhaler Inhale 2 puffs into the lungs every 6 (six) hours as needed for wheezing. 1 Inhaler 5  . aspirin 81 MG tablet Take 81 mg by mouth daily.    [name] atorvastatin (LIPITOR) 80 MG tablet Take 1 tablet (80 mg total) by mouth daily. 90 tablet 3  . budesonide-formoterol (SYMBICORT) 160-4.5 MCG/ACT inhaler Inhale 2 puffs into the lungs 2 (two) times daily.    [name] CALCIUM PO Take 1 tablet by mouth daily.    . carvedilol (COREG) 3.125 MG tablet Take 1 tablet (3.125 mg total) by mouth 2 (two) times daily. 60 tablet 3  . Cholecalciferol (VITAMIN D3) 2000 units capsule Take by mouth.    . clonazePAM (KLONOPIN) 0.5 MG tablet clonazepam 0.5 mg tablet    . clopidogrel (PLAVIX) 75 MG tablet Take 1 tablet (75 mg total) by mouth daily with breakfast. Only take daily for one month 30 tablet 0  . methocarbamol (ROBAXIN) 500 MG tablet Take 1 tablet (500 mg total) by mouth 2 (two) times daily. (Patient taking differently: Take 500-1,000 mg by mouth every 8 (eight) hours as needed. ) 180 tablet 3  . Multiple Vitamins-Minerals (MULTIVITAMIN WITH MINERALS) tablet Take 1 tablet by mouth daily.    [name] omeprazole (PRILOSEC) 40 MG capsule TAKE 1 CAPSULE BY MOUTH DAILY USUALLY 30MINUTES BEFORE BREAKFAST 90 capsule 1  . polyethylene glycol powder (GLYCOLAX/MIRALAX) powder Take 17 g by mouth as needed.     . sacubitril-valsartan (ENTRESTO) 24-26 MG Take 1 tablet by mouth 2 (two) times daily. 60 tablet 3  . traMADol (ULTRAM) 50 MG tablet Take 1 tablet (50 mg total) by mouth every 6 (six) hours as needed for moderate pain or severe pain. 60 tablet 0   No current facility-administered medications on file prior to visit.     There are no Patient Instructions on file for this visit. No follow-ups on file.   Regina Ganci  A Magda Muise, PA-C

## 2018-01-15 ENCOUNTER — Other Ambulatory Visit: Payer: Self-pay | Admitting: *Deleted

## 2018-01-15 DIAGNOSIS — I714 Abdominal aortic aneurysm, without rupture, unspecified: Secondary | ICD-10-CM

## 2018-01-18 DIAGNOSIS — M4716 Other spondylosis with myelopathy, lumbar region: Secondary | ICD-10-CM | POA: Diagnosis not present

## 2018-01-20 DIAGNOSIS — I25118 Atherosclerotic heart disease of native coronary artery with other forms of angina pectoris: Secondary | ICD-10-CM | POA: Insufficient documentation

## 2018-01-20 NOTE — Progress Notes (Signed)
Cardiology Office Note  Date:  01/21/2018   ID:  [name], DOB 1946-02-10, MRN [id number]  PCP:  Nathaniel Mc, MD   Chief Complaint  Patient presents with  . other    Follow up from AAA repair. Meds reviewed by the pt. verbally. Pt. c/o fatigue.     HPI:  72 years old with Past medical history of CAD,  posterior MI in 2002 treated with a bare-metal stent to the circumflex artery,   back surgery by Dr. Patrice Ramirez x2 In 2009 and 2010,  Prostate cancer with seed implantation negative Myoview scan 2010 with an ejection fraction of 60% (2010). abnormal EKG noted on office visit in 2012,  stress test that showed anterior wall ischemia.  Cardiac catheterization was performed on January 05 2011 that showed 95% proximal LAD disease.  bare metal stent was placed ( 3.5 x 18 mm vision bare metal stent). AAA 3.7 cm In 2013, up to 4.2 ( 4.5 cm May 2018) He presents for follow up of his CAD and AAA  Echocardiogram September 2019 Left ventricle: The cavity size was moderately dilated. There was   mild concentric hypertrophy. Systolic function was moderately to   severely reduced. The estimated ejection fraction was in the   range of 30% to 35%. Wall motion was normal; there were no   regional wall motion abnormalities. Doppler parameters are   consistent with abnormal left ventricular relaxation (grade 1   diastolic dysfunction). - Aorta: Aortic root dimension: 39 mm (ED). - Mitral valve: There was mild regurgitation. - Left atrium: The atrium was mildly dilated. - Pulmonary arteries: Systolic pressure was within the normal   range.  Stress test December 21, 2017  T wave inversion was noted during stress in the V5 and V6 leads.  There was no ST segment deviation noted during stress.  Defect 1: There is a small defect of moderate severity present in the basal inferior and apex location.  Findings consistent with prior myocardial infarction.  This is a high risk study.  Nuclear  stress EF: 16%.   Started on Entresto, carvedilol Completed endovascular repair of his AAA. Plan was for further ischemic work-up after surgery, possible left heart catheterization  He is recovering from AAA repair, groin bruising Has not been walking much as it has been very hot Chronic shortness of breath Legs are weak, limiting his walking Denies any chest pain concerning for angina  Spends most of his time taking care of grand daughter He has severe arthritis in his hands, wrist Likes to restore furniture  limited in his ability to exert himself secondary to arthritic pain  Previous lab work September 2017 showing total cholesterol 123  EKG personally reviewed by myself today  shows sinus rhythm with rate 71 beats per minute with no significant ST or T-wave abnormality, consider old inferior MI, LVH with repolarization abnormality No change in EKG compared to previous study  Prior medical hx he is s/p back surgery with profound anemia noted after the surgery. He was admitted to the [facility] after routine blood work showed a HCT of 8. In the ER was noted to have hypotension. He had a transfusion, EGD, colonoscopy.   CT of the ABD did not show a bleed. He has received an iron infusion.    PMH:   has a past medical history of AAA (abdominal aortic aneurysm) ([name]), Abnormal nuclear stress test, COPD (chronic obstructive pulmonary disease) ([place]), Coronary artery disease, GERD (gastroesophageal reflux disease), History of  kidney stones, History of prostate cancer, Hyperlipidemia, Hypertension, Myocardial infarction [name]), Neuromuscular disorder ([name]), Prostate cancer ([name]), PVD (peripheral vascular disease) ([name]), and Stroke ([name]).  PSH:    Past Surgical History:  Procedure Laterality Date  . BACK SURGERY  2009/2010   x 2  . CARDIAC CATHETERIZATION  2002   stent placement [place]  . ENDOVASCULAR REPAIR/STENT GRAFT N/A 01/02/2018   Procedure: ENDOVASCULAR REPAIR/STENT GRAFT;   Surgeon: Algernon Huxley, MD;  Location: [facility];  Service: Cardiovascular;  Laterality: N/A;  . FOOT SURGERY     bilateral   . HERNIA REPAIR     bilateral   . KNEE SURGERY     right knee   . PROSTATE SURGERY  09/2009   prostate implant  . SPINE SURGERY      Current Outpatient Medications  Medication Sig Dispense Refill  . albuterol (PROVENTIL HFA;VENTOLIN HFA) 108 (90 Base) MCG/ACT inhaler Inhale 2 puffs into the lungs every 6 (six) hours as needed for wheezing. 1 Inhaler 5  . aspirin 81 MG tablet Take 81 mg by mouth daily.    [name] atorvastatin (LIPITOR) 80 MG tablet Take 1 tablet (80 mg total) by mouth daily. 90 tablet 3  . budesonide-formoterol (SYMBICORT) 160-4.5 MCG/ACT inhaler Inhale 2 puffs into the lungs 2 (two) times daily.    [name] CALCIUM PO Take 1 tablet by mouth daily.    . carvedilol (COREG) 3.125 MG tablet Take 1 tablet (3.125 mg total) by mouth 2 (two) times daily. 60 tablet 3  . Cholecalciferol (VITAMIN D3) 2000 units capsule Take by mouth.    . clonazePAM (KLONOPIN) 0.5 MG tablet clonazepam 0.5 mg tablet    . methocarbamol (ROBAXIN) 500 MG tablet Take 1 tablet (500 mg total) by mouth 2 (two) times daily. (Patient taking differently: Take 500-1,000 mg by mouth every 8 (eight) hours as needed. ) 180 tablet 3  . Multiple Vitamins-Minerals (MULTIVITAMIN WITH MINERALS) tablet Take 1 tablet by mouth daily.    [name] omeprazole (PRILOSEC) 40 MG capsule TAKE 1 CAPSULE BY MOUTH DAILY USUALLY 30MINUTES BEFORE BREAKFAST 90 capsule 1  . polyethylene glycol powder (GLYCOLAX/MIRALAX) powder Take 17 g by mouth as needed.     . traMADol (ULTRAM) 50 MG tablet Take 1 tablet (50 mg total) by mouth every 6 (six) hours as needed for moderate pain or severe pain. 60 tablet 0  . sacubitril-valsartan (ENTRESTO) 49-51 MG Take 1 tablet by mouth 2 (two) times daily. 60 tablet 11   No current facility-administered medications for this visit.      Allergies:   Lyrica [pregabalin] and Prednisone    Social History:  The patient  reports that he has been smoking cigarettes. He has a 14.00 pack-year smoking history. He has never used smokeless tobacco. He reports that he does not drink alcohol or use drugs.   Family History:   family history includes Diabetes in his mother; Heart attack in his mother; Heart disease in his father.    Review of Systems: Review of Systems  Constitutional: Negative.   Respiratory: Positive for shortness of breath.   Cardiovascular: Negative.   Gastrointestinal: Negative.   Musculoskeletal: Positive for back pain and joint pain.  Neurological: Negative.   Psychiatric/Behavioral: Negative.   All other systems reviewed and are negative.    PHYSICAL EXAM: VS:  BP 118/68 (BP Location: Left Arm, Patient Position: Sitting, Cuff Size: Normal)   Pulse 71   Ht 5' 11.5" (1.816 m)   Wt 197 lb  8 oz (89.6 kg)   BMI 27.16 kg/m  , BMI Body mass index is 27.16 kg/m.  Constitutional:  oriented to person, place, and time. No distress.  HENT:  Head: Normocephalic and atraumatic.  Eyes:  no discharge. No scleral icterus.  Neck: Normal range of motion. Neck supple. No JVD present.  Cardiovascular: Normal rate, regular rhythm, normal heart sounds and intact distal pulses. Exam reveals no gallop and no friction rub. No edema No murmur heard. Pulmonary/Chest: Moderately decreased breath sounds throughout, no stridor. No respiratory distress.  no wheezes.  no rales.  no tenderness.  Abdominal: Soft.  no distension.  no tenderness.  Musculoskeletal: Normal range of motion.  no  tenderness or deformity.  Neurological:  normal muscle tone. Coordination normal. No atrophy Skin: Skin is warm and dry. No rash noted. not diaphoretic.  Psychiatric:  normal mood and affect. behavior is normal. Thought content normal.    Recent Labs: 12/31/2017: ALT 13 01/03/2018: BUN 13; Creatinine, Ser 1.05; Hemoglobin 14.2; Magnesium 1.7; Platelets 156; Potassium 3.8; Sodium 141     Lipid Panel Lab Results  Component Value Date   CHOL 123 12/31/2017   HDL 38 (L) 12/31/2017   LDLCALC 73 12/31/2017   TRIG 59 12/31/2017      Wt Readings from Last 3 Encounters:  01/21/18 197 lb 8 oz (89.6 kg)  01/10/18 197 lb (89.4 kg)  01/02/18 199 lb 11.8 oz (90.6 kg)      ASSESSMENT AND PLAN:  Mixed hyperlipidemia -  Cholesterol at goal Continue current regiment  Essential hypertension  He will monitor blood pressure We will increase Entresto up to 49/51 mg twice daily He will call for any low blood pressures, near syncope symptoms  Dilated cardiomyopathy Presumed to be ischemic though not clear On carvedilol and Entresto Will increase the [place], he prefers to repeat echocardiogram in 3 months before cardiac catheterization  Atherosclerosis of native coronary artery with stable angina pectoris, unspecified whether native or transplanted heart (HCC)  Recent stress test with only small region of ischemia Echocardiogram ejection fraction 30 to 35% No recent prior study for comparison He is inactive but will start a walking program as his back and joints tolerate We did discuss doing a cardiac catheterization for low ejection fraction , abnormal stress test He would like to wait , but after his symptoms, reevaluate ejection fraction in several months time and then make a decision He is tired, just had endograft placement for AAA .  Once more time to recover  Centrilobular emphysema ([place]) -  Still smoking, one half pack per day No desire to quit Discussed again with him  Tobacco abuse counseling - We have encouraged him to continue to work on weaning his cigarettes and smoking cessation. He will continue to work on this and does not want any assistance with chantix.   COPD exacerbation ([place]) -  Seen by pulmonary in [place], uses inhalers Inhalers expensive for him, he purchases these from [place] pharmacy  AAA Recent endograft placement Reports it  went well   Total encounter time more than 25 minutes  Greater than 50% was spent in counseling and coordination of care with the patient   Disposition:   F/U  3 months   Orders Placed This Encounter  Procedures  . EKG 12-Lead     Signed, Esmond Plants, M.D., Ph.D. 01/21/2018  [place], [place]

## 2018-01-21 ENCOUNTER — Ambulatory Visit (INDEPENDENT_AMBULATORY_CARE_PROVIDER_SITE_OTHER): Payer: Medicare Other | Admitting: Cardiovascular Disease

## 2018-01-21 ENCOUNTER — Encounter: Payer: Self-pay | Admitting: Cardiovascular Disease

## 2018-01-21 VITALS — BP 118/68 | HR 71 | Ht 71.5 in | Wt 197.5 lb

## 2018-01-21 DIAGNOSIS — I714 Abdominal aortic aneurysm, without rupture, unspecified: Secondary | ICD-10-CM

## 2018-01-21 DIAGNOSIS — E782 Mixed hyperlipidemia: Secondary | ICD-10-CM

## 2018-01-21 DIAGNOSIS — I10 Essential (primary) hypertension: Secondary | ICD-10-CM | POA: Diagnosis not present

## 2018-01-21 DIAGNOSIS — I2510 Atherosclerotic heart disease of native coronary artery without angina pectoris: Secondary | ICD-10-CM

## 2018-01-21 DIAGNOSIS — Z720 Tobacco use: Secondary | ICD-10-CM

## 2018-01-21 DIAGNOSIS — J432 Centrilobular emphysema: Secondary | ICD-10-CM

## 2018-01-21 DIAGNOSIS — I429 Cardiomyopathy, unspecified: Secondary | ICD-10-CM

## 2018-01-21 DIAGNOSIS — I25118 Atherosclerotic heart disease of native coronary artery with other forms of angina pectoris: Secondary | ICD-10-CM | POA: Diagnosis not present

## 2018-01-21 MED ORDER — SACUBITRIL-VALSARTAN 49-51 MG PO TABS: 1.0000 | ORAL_TABLET | Freq: Two times a day (BID) | ORAL | 11 refills | Status: DC

## 2018-01-21 NOTE — Patient Instructions (Addendum)
Please call with blood pressures  Medication Instructions:   Please increase the entresto up to 49/51 mg twice a day  Labwork:  No new labs needed  Testing/Procedures:  No further testing at this time   Follow-Up: It was a pleasure seeing you in the office today. Please call us if you have new issues that need to be addressed before your next appt.  [phone]  Your physician wants you to follow-up in: 3 months.  You will receive a reminder letter in the mail two months in advance. If you don't receive a letter, please call our office to schedule the follow-up appointment.  If you need a refill on your cardiac medications before your next appointment, please call your pharmacy.  For educational health videos Log in to : [email or web address] Or : [email or web address], password : [email or web address]

## 2018-01-23 ENCOUNTER — Other Ambulatory Visit: Payer: Self-pay | Admitting: Internal Medicine

## 2018-01-23 ENCOUNTER — Telehealth: Payer: Self-pay | Admitting: Cardiovascular Disease

## 2018-01-23 DIAGNOSIS — Z23 Encounter for immunization: Secondary | ICD-10-CM | POA: Diagnosis not present

## 2018-01-23 NOTE — Telephone Encounter (Signed)
I called and spoke with the patient's wife.  She states that the co-pay for entresto has been $307/ month and will be higher at the beginning of next year.  The patient's wife called [employer] and they are sending Patient Assistance forms to them. I advised her we can also provide this for them. Per Mrs. Schmit, they will come pick these up today.  Patient arrived at the front desk prior to me placing patient assistance forms up there for them. Forms given to front desk staff to give to the patient.

## 2018-01-23 NOTE — Telephone Encounter (Signed)
Pt c/o medication issue:  1. Name of Medication: Entresto   2. How are you currently taking this medication (dosage and times per day)? 1 tablet 2 times daily  3. Are you having a reaction (difficulty breathing--STAT)? no  4. What is your medication issue? Patient wife calling, states that the [facility] co-pay will be too expensive, would like to discuss other options.  Please call to discuss

## 2018-01-24 NOTE — Telephone Encounter (Signed)
Patient came by office °Dropped off Patient Assistance forms to be completed °Placed in nurse box  °

## 2018-01-25 NOTE — Telephone Encounter (Signed)
Forms received.  Given to Dr. Donivan Scull nurse for completion and MD signature.

## 2018-01-25 NOTE — Telephone Encounter (Signed)
Spoke with patients wife per release form and reviewed that we will complete this documentation when provider returns and will fax it to the assistance program for them. She was appreciative for the call with no further questions at this time.

## 2018-01-29 ENCOUNTER — Telehealth (INDEPENDENT_AMBULATORY_CARE_PROVIDER_SITE_OTHER): Payer: Self-pay

## 2018-01-29 NOTE — Telephone Encounter (Signed)
Patient's wife called and stated that their bottle of Plavix says for him to only take the medicine for one month after his procedure, she wants to know if he should continue the medication beyond the one month?  If so, she would like for [place] to be on the look out for a refill requesting form [place].

## 2018-01-29 NOTE — Telephone Encounter (Signed)
Called the patient's wife back to let her know that he can start taking a Low dose Aspirin daily and to stop taking the Plavix, I had to leave a message.

## 2018-02-04 NOTE — Telephone Encounter (Signed)
Assistance forms completed and will fax over to company for review.

## 2018-02-05 ENCOUNTER — Telehealth: Payer: Self-pay | Admitting: Cardiovascular Disease

## 2018-02-05 NOTE — Telephone Encounter (Signed)
Call when schedule in December is open

## 2018-02-05 NOTE — Telephone Encounter (Signed)
Patient wife says they have rx coverage and is not sure if they will be approved   They were also told by insurance rep to schedule a cath if needed before the end of the year because of deductibles.  Please call to discuss if this is needed or possible.

## 2018-02-05 NOTE — Telephone Encounter (Signed)
-----   Message from Emily Filbert, RN sent at 12/25/2017  4:23 PM EDT ----- Please call the patient/ his wife to schedule follow up labs. He is seeing Dr. Rockey Situ on 12/10 and needs to come in the week before for a FASTING lipid/ liver panel.  They prefer to come to the office.  Orders placed.   Thanks!

## 2018-02-06 NOTE — Telephone Encounter (Signed)
Returned the call to the patient's wife. She stated that if the Entresto did not work then the patient was supposed to have a cath. If this is the case then they will need to schedule the cath for this year for insurance purposes.  She was also inquiring whether or not the [facility] had been approved yet. Message has been routed to the provider's nurse.

## 2018-02-07 ENCOUNTER — Other Ambulatory Visit: Payer: Self-pay | Admitting: *Deleted

## 2018-02-07 DIAGNOSIS — E782 Mixed hyperlipidemia: Secondary | ICD-10-CM

## 2018-02-07 DIAGNOSIS — Z79899 Other long term (current) drug therapy: Secondary | ICD-10-CM

## 2018-02-07 NOTE — Telephone Encounter (Signed)
Spoke with patient and advised that we faxed application in but that it is still pending. Reviewed that they will send her a letter letting her know if he qualifies for any assistance and to please call me back with any questions. Reviewed at last visit they wanted to increase Entresto and see if that helps before rechecking EF and doing heart cath. She verbalized understanding with no further questions at this time.

## 2018-02-11 ENCOUNTER — Encounter (INDEPENDENT_AMBULATORY_CARE_PROVIDER_SITE_OTHER): Payer: Self-pay | Admitting: Vascular Surgery

## 2018-02-11 ENCOUNTER — Ambulatory Visit (INDEPENDENT_AMBULATORY_CARE_PROVIDER_SITE_OTHER): Payer: Medicare Other

## 2018-02-11 ENCOUNTER — Ambulatory Visit (INDEPENDENT_AMBULATORY_CARE_PROVIDER_SITE_OTHER): Payer: Medicare Other | Admitting: Vascular Surgery

## 2018-02-11 VITALS — BP 138/82 | HR 60 | Resp 18 | Ht 71.0 in | Wt 197.0 lb

## 2018-02-11 DIAGNOSIS — I10 Essential (primary) hypertension: Secondary | ICD-10-CM

## 2018-02-11 DIAGNOSIS — I714 Abdominal aortic aneurysm, without rupture, unspecified: Secondary | ICD-10-CM

## 2018-02-11 DIAGNOSIS — F17210 Nicotine dependence, cigarettes, uncomplicated: Secondary | ICD-10-CM

## 2018-02-11 DIAGNOSIS — E782 Mixed hyperlipidemia: Secondary | ICD-10-CM

## 2018-02-11 NOTE — Progress Notes (Signed)
Subjective:    Patient ID: [name], male    DOB: [birth year], 72 y.o.   MRN: [id number] Chief Complaint  Patient presents with  . Follow-up    1 month EVAR   Patient presents for a one-month postoperative follow-up.  Patient is status post an [place] on January 02, 2018.  The patient was seen with his wife.  The patient presents today without complaint.  She denies any issues with his groin incisions.  Patient denies any abdominal pain, back pain or thrombosis to the bilateral lower extremity.  He denies any fever, nausea vomiting.  Patient denies any changes in his bowel habits.  The patient underwent a ER which was notable for a patent endovascular aneurysm repair with no evidence of endoleak.  Residual AAA sac has decreased in size from 4.9cm to 4cm x 3.3cm.  Review of Systems  Constitutional: Negative.   HENT: Negative.   Eyes: Negative.   Respiratory: Negative.   Cardiovascular: Negative.   Gastrointestinal: Negative.   Endocrine: Negative.   Genitourinary: Negative.   Musculoskeletal: Negative.   Skin: Negative.   Allergic/Immunologic: Negative.   Neurological: Negative.   Hematological: Negative.   Psychiatric/Behavioral: Negative.       Objective:   Physical Exam  Constitutional: He is oriented to person, place, and time. He appears well-developed and well-nourished. No distress.  HENT:  Head: Normocephalic and atraumatic.  Right Ear: External ear normal.  Left Ear: External ear normal.  Eyes: Pupils are equal, round, and reactive to light. Conjunctivae and EOM are normal.  Neck: Normal range of motion.  Cardiovascular: Normal rate, regular rhythm, normal heart sounds and intact distal pulses.  Pulses:      Radial pulses are 2+ on the right side, and 2+ on the left side.       Dorsalis pedis pulses are 1+ on the right side, and 1+ on the left side.       Posterior tibial pulses are 1+ on the right side, and 1+ on the left side.  Pulmonary/Chest: Effort normal  and breath sounds normal.  Abdominal: Soft. Bowel sounds are normal. He exhibits no distension. There is no tenderness. There is no guarding.  Musculoskeletal: Normal range of motion.  Neurological: He is alert and oriented to person, place, and time.  Skin: He is not diaphoretic.  Bilateral Groins are healed. No signs of infection.   Psychiatric: He has a normal mood and affect. His behavior is normal. Judgment and thought content normal.  Vitals reviewed.  BP 138/82 (BP Location: Right Arm, Patient Position: Sitting)   Pulse 60   Resp 18   Ht 5\' 11"  (1.803 m)   Wt 197 lb (89.4 kg)   BMI 27.48 kg/m   Past Medical History:  Diagnosis Date  . AAA (abdominal aortic aneurysm) ([place])    a.  CTA 7/19: measured 4.5 x 5.3 cm and greatest transverse dimensions  . Abnormal nuclear stress test    a.  Myoview 2012: anterior wall ischemia with an estimated EF of 26%.  This was a new wall motion abnormality as well as a newly reduced EF  . COPD (chronic obstructive pulmonary disease) ([place])   . Coronary artery disease    a.  Posterior MI in 2002 status post BMS to the LCx; b. [place] 2012: 95% stenosis of the proximal LAD, 95% stenosis of the mid LAD, 30% in-stent restenosis of the mid left circumflex with a second lesion of diffuse 50% stenosis.  The patient  underwent successful PCI/BMS to the mid LAD with 0% residual stenosis, LV gram not performed  . GERD (gastroesophageal reflux disease)   . History of kidney stones   . History of prostate cancer   . Hyperlipidemia   . Hypertension   . Myocardial infarction ([place])   . Neuromuscular disorder ([place])   . Prostate cancer ([place])    a.  Status post seeding  . PVD (peripheral vascular disease) ([place])   . Stroke [facility])    Social History   Socioeconomic History  . Marital status: Married    Spouse name: Not on file  . Number of children: Not on file  . Years of education: Not on file  . Highest education level: Not on file  Occupational History  .  Not on file  Social Needs  . Financial resource strain: Not on file  . Food insecurity:    Worry: Not on file    Inability: Not on file  . Transportation needs:    Medical: Not on file    Non-medical: Not on file  Tobacco Use  . Smoking status: Current Every Day Smoker    Packs/day: 0.25    Years: 56.00    Pack years: 14.00    Types: Cigarettes  . Smokeless tobacco: Never Used  . Tobacco comment: back to smoking X1 year  Substance and Sexual Activity  . Alcohol use: No    Alcohol/week: 0.0 standard drinks  . Drug use: No  . Sexual activity: Not on file  Lifestyle  . Physical activity:    Days per week: Not on file    Minutes per session: Not on file  . Stress: Not on file  Relationships  . Social connections:    Talks on phone: Not on file    Gets together: Not on file    Attends religious service: Not on file    Active member of club or organization: Not on file    Attends meetings of clubs or organizations: Not on file    Relationship status: Not on file  . Intimate partner violence:    Fear of current or ex partner: Not on file    Emotionally abused: Not on file    Physically abused: Not on file    Forced sexual activity: Not on file  Other Topics Concern  . Not on file  Social History Narrative  . Not on file   Past Surgical History:  Procedure Laterality Date  . BACK SURGERY  2009/2010   x 2  . CARDIAC CATHETERIZATION  2002   stent placement [place]  . ENDOVASCULAR REPAIR/STENT GRAFT N/A 01/02/2018   Procedure: ENDOVASCULAR REPAIR/STENT GRAFT;  Surgeon: Algernon Huxley, MD;  Location: [facility];  Service: Cardiovascular;  Laterality: N/A;  . FOOT SURGERY     bilateral   . HERNIA REPAIR     bilateral   . KNEE SURGERY     right knee   . PROSTATE SURGERY  09/2009   prostate implant  . SPINE SURGERY     Family History  Problem Relation Age of Onset  . Heart attack Mother   . Diabetes Mother   . Heart disease Father    Allergies  Allergen  Reactions  . Lyrica [Pregabalin]     Swelling   . Prednisone     Feels like having a heart attack. Has had the injection form before and does well.      Assessment & Plan:  Patient presents for  a one-month postoperative follow-up.  Patient is status post an [place] on January 02, 2018.  The patient was seen with his wife.  The patient presents today without complaint.  She denies any issues with his groin incisions.  Patient denies any abdominal pain, back pain or thrombosis to the bilateral lower extremity.  He denies any fever, nausea vomiting.  Patient denies any changes in his bowel habits.  The patient underwent a ER which was notable for a patent endovascular aneurysm repair with no evidence of endoleak.  Residual AAA sac has decreased in size from 4.9cm to 4cm x 3.3cm.  1. AAA (abdominal aortic aneurysm) without rupture (HCC) s/p repair on 01/02/18 - stable Patient presents without complaint Patient's physical exam is unremarkable She does had an unremarkable postoperative course EVAR with patent stent with no endoleak.  Velocities throughout Patient should follow-up in 3 months for an EVAR and ABI for continued postoperative surveillance  - VAS [place] EVAR DUPLEX; Future - VAS [place] ABI WITH/WO TBI; Future  2. Mixed hyperlipidemia - Stable On ASA and Lipitor. Encouraged good control as its slows the progression of atherosclerotic disease  3. Essential hypertension - Stable Encouraged good control as its slows the progression of atherosclerotic and aneurysmal disease  Current Outpatient Medications on File Prior to Visit  Medication Sig Dispense Refill  . albuterol (PROVENTIL HFA;VENTOLIN HFA) 108 (90 Base) MCG/ACT inhaler Inhale 2 puffs into the lungs every 6 (six) hours as needed for wheezing. 1 Inhaler 5  . aspirin 81 MG tablet Take 81 mg by mouth daily.    [name] atorvastatin (LIPITOR) 80 MG tablet Take 1 tablet (80 mg total) by mouth daily. 90 tablet 3  . budesonide-formoterol  (SYMBICORT) 160-4.5 MCG/ACT inhaler Inhale 2 puffs into the lungs 2 (two) times daily.    [name] CALCIUM PO Take 1 tablet by mouth daily.    . carvedilol (COREG) 3.125 MG tablet Take 1 tablet (3.125 mg total) by mouth 2 (two) times daily. 60 tablet 3  . Cholecalciferol (VITAMIN D3) 2000 units capsule Take by mouth.    . clonazePAM (KLONOPIN) 0.5 MG tablet clonazepam 0.5 mg tablet    . methocarbamol (ROBAXIN) 500 MG tablet Take 1 tablet (500 mg total) by mouth 2 (two) times daily. (Patient taking differently: Take 500-1,000 mg by mouth every 8 (eight) hours as needed. ) 180 tablet 3  . Multiple Vitamins-Minerals (MULTIVITAMIN WITH MINERALS) tablet Take 1 tablet by mouth daily.    [name] omeprazole (PRILOSEC) 40 MG capsule TAKE 1 CAPSULE BY MOUTH DAILY USUALLY 30MINUTES BEFORE BREAKFAST 90 capsule 1  . polyethylene glycol powder (GLYCOLAX/MIRALAX) powder Take 17 g by mouth as needed.     . sacubitril-valsartan (ENTRESTO) 49-51 MG Take 1 tablet by mouth 2 (two) times daily. (Patient taking differently: Take 1 tablet by mouth 2 (two) times daily. ) 60 tablet 11  . traMADol (ULTRAM) 50 MG tablet Take 1 tablet (50 mg total) by mouth every 6 (six) hours as needed for moderate pain or severe pain. 60 tablet 0   No current facility-administered medications on file prior to visit.    There are no Patient Instructions on file for this visit. No follow-ups on file.  KIMBERLY A STEGMAYER, PA-C

## 2018-02-12 ENCOUNTER — Telehealth: Payer: Self-pay | Admitting: Cardiovascular Disease

## 2018-02-12 NOTE — Telephone Encounter (Signed)
I called and spoke with the patient's wife. I have advised her that we received fax notification today from [employer] that the patient has been approved for patient assistance for Entresto. Per fax, this is for the remainder of the calendar year.  She is aware that this should be shipped to their home address.  She was very appreciative for the call.

## 2018-03-13 ENCOUNTER — Telehealth: Payer: Self-pay | Admitting: *Deleted

## 2018-03-13 ENCOUNTER — Telehealth: Payer: Self-pay | Admitting: Cardiovascular Disease

## 2018-03-13 DIAGNOSIS — I10 Essential (primary) hypertension: Secondary | ICD-10-CM

## 2018-03-13 DIAGNOSIS — Z79899 Other long term (current) drug therapy: Secondary | ICD-10-CM

## 2018-03-13 DIAGNOSIS — E782 Mixed hyperlipidemia: Secondary | ICD-10-CM

## 2018-03-13 NOTE — Telephone Encounter (Signed)
Ok great! Just wanted to make sure I did or didn't need to call him. Thanks a million!

## 2018-03-13 NOTE — Telephone Encounter (Signed)
Attempted to call patient and see why he is coming in sooner  Will try again at a later time

## 2018-03-13 NOTE — Telephone Encounter (Signed)
Yes maam I spoke with him this morning.

## 2018-03-13 NOTE — Telephone Encounter (Signed)
-----   Message from Britt Bottom, [place] sent at 03/13/2018 10:44 AM EST ----- Pt f/u with TG 03/26/18 . Pt not due for 3 month f/u until 1/19.  Please advise if ok to keep appointment or should f/u in January.  Thank you, Lenda Kelp

## 2018-03-13 NOTE — Telephone Encounter (Signed)
Orders reentered for the [place].  Is the patient aware to be fasting and knows the [place]?

## 2018-03-13 NOTE — Telephone Encounter (Signed)
-----   Message from Clarisse Gouge sent at 03/13/2018  9:52 AM EST ----- Regarding: RE: orders change  No clinic lab this day patient [facility]   :-)   Thanks  ----- Message ----- From: Clarisse Gouge Sent: 02/05/2018   5:00 PM EST To: Emily Filbert, RN, Clarisse Gouge Subject: orders                                         I scheduled 12/3 (hoping there is a CMA .  If not will fu when staff schedule is out.  Can you put in an order please maam   :-) ----- Message ----- From: Emily Filbert, RN Sent: 12/25/2017   4:23 PM EDT To: Emily Filbert, RN, [facility] Scheduling  Please call the patient/ his wife to schedule follow up labs. He is seeing Dr. Rockey Situ on 12/10 and needs to come in the week before for a FASTING lipid/ liver panel.  They prefer to come to the office.  Orders placed.   Thanks!

## 2018-03-15 ENCOUNTER — Telehealth: Payer: Self-pay

## 2018-03-15 NOTE — Telephone Encounter (Signed)
Call pt regarding lung screening. Pt is a current smoker , smoking about a 1/2 pack per day. Would like scan in the afternoon on a Wednesday, Thursday, or Friday. He denies any new health issues.

## 2018-03-18 ENCOUNTER — Telehealth: Payer: Self-pay | Admitting: *Deleted

## 2018-03-18 DIAGNOSIS — Z122 Encounter for screening for malignant neoplasm of respiratory organs: Secondary | ICD-10-CM

## 2018-03-18 DIAGNOSIS — R918 Other nonspecific abnormal finding of lung field: Secondary | ICD-10-CM

## 2018-03-18 DIAGNOSIS — Z87891 Personal history of nicotine dependence: Secondary | ICD-10-CM

## 2018-03-18 NOTE — Telephone Encounter (Signed)
Contacted patient regarding 6 month lung screening scan follow up. Patient is agreeable to being scheduled.

## 2018-03-18 NOTE — Telephone Encounter (Signed)
Rescheduled the appointment They were confused as to when he needed to be seen

## 2018-03-19 ENCOUNTER — Other Ambulatory Visit: Payer: Medicare Other

## 2018-03-19 ENCOUNTER — Ambulatory Visit: Payer: Medicare Other

## 2018-03-26 ENCOUNTER — Ambulatory Visit: Payer: Medicare Other | Admitting: Cardiovascular Disease

## 2018-03-27 ENCOUNTER — Ambulatory Visit (INDEPENDENT_AMBULATORY_CARE_PROVIDER_SITE_OTHER): Payer: Medicare Other | Admitting: Family Medicine

## 2018-03-27 ENCOUNTER — Encounter: Payer: Self-pay | Admitting: Family Medicine

## 2018-03-27 ENCOUNTER — Ambulatory Visit
Admission: RE | Admit: 2018-03-27 | Discharge: 2018-03-27 | Disposition: A | Discharge status: Home / Self Care | Payer: Medicare Other | Source: Ambulatory Visit | Attending: Oncology | Admitting: Oncology

## 2018-03-27 VITALS — BP 140/78 | HR 66 | Temp 98.0°F | Ht 72.0 in | Wt 202.0 lb

## 2018-03-27 DIAGNOSIS — R0982 Postnasal drip: Secondary | ICD-10-CM

## 2018-03-27 DIAGNOSIS — Z122 Encounter for screening for malignant neoplasm of respiratory organs: Secondary | ICD-10-CM | POA: Insufficient documentation

## 2018-03-27 DIAGNOSIS — J019 Acute sinusitis, unspecified: Secondary | ICD-10-CM

## 2018-03-27 DIAGNOSIS — R05 Cough: Secondary | ICD-10-CM

## 2018-03-27 DIAGNOSIS — R918 Other nonspecific abnormal finding of lung field: Secondary | ICD-10-CM | POA: Diagnosis not present

## 2018-03-27 DIAGNOSIS — Z87891 Personal history of nicotine dependence: Secondary | ICD-10-CM | POA: Insufficient documentation

## 2018-03-27 DIAGNOSIS — J439 Emphysema, unspecified: Secondary | ICD-10-CM | POA: Diagnosis not present

## 2018-03-27 DIAGNOSIS — R059 Cough, unspecified: Secondary | ICD-10-CM

## 2018-03-27 MED ORDER — AMOXICILLIN-POT CLAVULANATE 875-125 MG PO TABS: 1.0000 | ORAL_TABLET | Freq: Two times a day (BID) | ORAL | 0 refills | Status: DC

## 2018-03-27 NOTE — Progress Notes (Signed)
Subjective:    Patient ID: [name], male    DOB: 1945-10-01, 72 y.o.   MRN: [id number]  HPI  Presents to clinic c/o sinus congestion, drainage and cough for little over a week.  Patient states the drainage down the back of his throat has gotten very thick and makes him cough a lot throughout the night.  Patient states also when he blows his nose, the discharge is thick yellow/green.  Patient Active Problem List   Diagnosis Date Noted  . CAD (coronary artery disease), native coronary artery 01/20/2018  . AAA (abdominal aortic aneurysm) without rupture ([place]) 10/30/2017  . Personal history of tobacco use, presenting hazards to health 08/29/2016  . COPD exacerbation ([place]) 04/03/2015  . Medicare annual wellness visit, subsequent 05/23/2014  . Barrett's esophagus 05/20/2014  . Constipation 05/20/2014  . Arthritis of hand 05/07/2014  . COPD (chronic obstructive pulmonary disease) ([place]) 11/06/2011  . Pulmonary nodule 11/06/2011  . Tobacco abuse counseling 02/12/2011  . CAD, NATIVE VESSEL 10/18/2008  . COLONIC POLYPS 08/05/2008  . Hyperlipidemia 08/05/2008  . Essential hypertension 08/05/2008  . GERD 08/05/2008   Social History   Tobacco Use  . Smoking status: Current Every Day Smoker    Packs/day: 0.25    Years: 56.00    Pack years: 14.00    Types: Cigarettes  . Smokeless tobacco: Never Used  . Tobacco comment: back to smoking X1 year  Substance Use Topics  . Alcohol use: No    Alcohol/week: 0.0 standard drinks   Review of Systems  Constitutional: Negative for chills, fatigue and fever.  HENT: +congestion, ear pain, sinus pain/drainage    Eyes: Negative.   Respiratory: +cough. Negative for shortness of breath and wheezing.   Cardiovascular: Negative for chest pain, palpitations and leg swelling.  Gastrointestinal: Negative for abdominal pain, diarrhea, nausea and vomiting.  Genitourinary: Negative for dysuria, frequency and urgency.  Musculoskeletal: Negative for  arthralgias and myalgias.  Skin: Negative for color change, pallor and rash.  Neurological: Negative for syncope, light-headedness and headaches.  Psychiatric/Behavioral: The patient is not nervous/anxious.        Objective:   Physical Exam  Constitutional: He is oriented to person, place, and time. No distress.  HENT:  Head: Normocephalic and atraumatic.  Positive fullness bilateral TMs.  Positive thick yellow nasal discharge.  Positive postnasal drip.  Positive maxillary and facial sinus tenderness.  Eyes: Conjunctivae and EOM are normal. No scleral icterus.  Neck: Neck supple. No tracheal deviation present.  Cardiovascular: Normal rate and regular rhythm.  Pulmonary/Chest: Effort normal and breath sounds normal. No respiratory distress. He has no wheezes. He has no rales.  Musculoskeletal: He exhibits no edema.  Neurological: He is alert and oriented to person, place, and time.  Skin: Skin is warm and dry. No pallor.  Psychiatric: He has a normal mood and affect. His behavior is normal.  Nursing note and vitals reviewed.   Vitals:   03/27/18 0941  BP: 140/78  Pulse: 66  Temp: 98 F (36.7 C)  SpO2: 90%      Assessment & Plan:   Acute sinusitis-patient will take Augmentin twice daily for 10 days.  Advised to increase fluid intake, rest and do good handwashing.  Also suggested patient try saline nasal washes to rinse out congestion.  Postnasal drip/cough-lungs are clear on exam so I suspect cough is related to postnasal drip.  Patient advised to use his Flonase nasal spray and Tessalon Perles as needed.  Patient states he has  both nasal spray and Tessalon Perles at home from previous prescription and does not require a new prescription.  Keep regularly scheduled follow-up with PCP as planned.  Return to clinic sooner if any issues arise.

## 2018-03-27 NOTE — Patient Instructions (Signed)
Use flonase nasal spray and tessalon perles if needed for cough

## 2018-04-01 ENCOUNTER — Encounter: Payer: Self-pay | Admitting: *Deleted

## 2018-04-15 ENCOUNTER — Ambulatory Visit (INDEPENDENT_AMBULATORY_CARE_PROVIDER_SITE_OTHER): Payer: Medicare Other | Admitting: Nurse Practitioner

## 2018-04-15 ENCOUNTER — Ambulatory Visit (INDEPENDENT_AMBULATORY_CARE_PROVIDER_SITE_OTHER): Payer: Medicare Other

## 2018-04-15 ENCOUNTER — Ambulatory Visit: Payer: Self-pay | Admitting: *Deleted

## 2018-04-15 ENCOUNTER — Encounter (INDEPENDENT_AMBULATORY_CARE_PROVIDER_SITE_OTHER): Payer: Self-pay | Admitting: Nurse Practitioner

## 2018-04-15 VITALS — BP 163/89 | HR 75 | Resp 14 | Ht 72.0 in | Wt 196.8 lb

## 2018-04-15 DIAGNOSIS — I714 Abdominal aortic aneurysm, without rupture, unspecified: Secondary | ICD-10-CM

## 2018-04-15 DIAGNOSIS — K521 Toxic gastroenteritis and colitis: Secondary | ICD-10-CM

## 2018-04-15 DIAGNOSIS — E782 Mixed hyperlipidemia: Secondary | ICD-10-CM

## 2018-04-15 DIAGNOSIS — I10 Essential (primary) hypertension: Secondary | ICD-10-CM | POA: Diagnosis not present

## 2018-04-15 DIAGNOSIS — F17210 Nicotine dependence, cigarettes, uncomplicated: Secondary | ICD-10-CM | POA: Diagnosis not present

## 2018-04-15 DIAGNOSIS — I2510 Atherosclerotic heart disease of native coronary artery without angina pectoris: Secondary | ICD-10-CM

## 2018-04-15 DIAGNOSIS — K219 Gastro-esophageal reflux disease without esophagitis: Secondary | ICD-10-CM | POA: Diagnosis not present

## 2018-04-15 DIAGNOSIS — T3695XA Adverse effect of unspecified systemic antibiotic, initial encounter: Principal | ICD-10-CM

## 2018-04-15 MED ORDER — LEVOFLOXACIN 500 MG PO TABS: 500.0000 mg | ORAL_TABLET | Freq: Every day | ORAL | 0 refills | Status: DC

## 2018-04-15 NOTE — Telephone Encounter (Signed)
Patient was seen on 03-27-18 for sinus congestion, drainage and cough, he stated he is not feeling better and is hoping if something different can be called in   Patient reports the antibiotic caused diarrhea that is still present- now- it seemed to help a little- but it did not clear up the cough and congestion. ( patient makes note that Doxycycline causes diarrhea also)  Patient is using his inhaler everyday- he is also using Tessalon pearls and nasal spray.  Patient wants to know what else he can do. Can he be treated with different medication- or does he need to come back?  Answer Assessment - Initial Assessment Questions 1. ONSET: "When did the cough begin?"      Cough has been present since before 03/27/18 2. SEVERITY: "How bad is the cough today?"      Keeping patient awake at night- sinus drainage is also presnet 3. RESPIRATORY DISTRESS: "Describe your breathing."      Breathing is effected 4. FEVER: "Do you have a fever?" If so, ask: "What is your temperature, how was it measured, and when did it start?"     No fever 5. SPUTUM: "Describe the color of your sputum" (clear, white, yellow, green)     Green and whitish in color 6. HEMOPTYSIS: "Are you coughing up any blood?" If so ask: "How much?" (flecks, streaks, tablespoons, etc.)     no 7. CARDIAC HISTORY: "Do you have any history of heart disease?" (e.g., heart attack, congestive heart failure)      History of stints 8. LUNG HISTORY: "Do you have any history of lung disease?"  (e.g., pulmonary embolus, asthma, emphysema)     COPD 9. PE RISK FACTORS: "Do you have a history of blood clots?" (or: recent major surgery, recent prolonged travel, bedridden)     no 10. OTHER SYMPTOMS: "Do you have any other symptoms?" (e.g., runny nose, wheezing, chest pain)       Sinus drainage, wheezing 11. PREGNANCY: "Is there any chance you are pregnant?" "When was your last menstrual period?"       n/a 12. TRAVEL: "Have you traveled out of the  country in the last month?" (e.g., travel history, exposures)       no  Protocols used: [place]

## 2018-04-15 NOTE — Telephone Encounter (Signed)
I will call in an alternative abx (Levaquin) but He needs to  Come by the lab to give [place] a stool sample to rule out c dificile colitis. I have ordered the stool cultures

## 2018-04-15 NOTE — Progress Notes (Signed)
Subjective:    Patient ID: [name], male    DOB: 1946-04-01, 72 y.o.   MRN: [id number] Chief Complaint  Patient presents with  . Follow-up    HPI  [name] is a 72 y.o. male that is following up for repair of his normal aortic aneurysm.  He reports having no issues at his groin site or pain in his abdomen since its repair.  Currently his only issues are sinus related.  He has had no major changes since his last visit.  He underwent endovascular aneurysm repair on 01/02/2018.  Patient denies abdominal pain or back pain, no other abdominal complaints. No groin related complaints. No symptoms consistent with distal embolization No changes in claudication distance.   There have been no interval changes in his overall healthcare since his last visit.   Patient denies amaurosis fugax or TIA symptoms. There is no history of claudication or rest pain symptoms of the lower extremities. The patient denies angina or shortness of breath.   Duplex US of the aorta and iliac arteries shows a 4.3 cm AAA sac with no endoleak, likely no change in the sac compared to the previous study.  CT on 10/30/2017 measured his AAA at 4.5 cm x 5.3 cm.  Sound done on 02/11/2018 measure his diameter at 4.03 cm x 3.33 cm.  Today's study results are more consistent with results found on the CT, given the timeframe since his repair.   Past Medical History:  Diagnosis Date  . AAA (abdominal aortic aneurysm) ([place])    a.  CTA 7/19: measured 4.5 x 5.3 cm and greatest transverse dimensions  . Abnormal nuclear stress test    a.  Myoview 2012: anterior wall ischemia with an estimated EF of 26%.  This was a new wall motion abnormality as well as a newly reduced EF  . COPD (chronic obstructive pulmonary disease) ([place])   . Coronary artery disease    a.  Posterior MI in 2002 status post BMS to the LCx; b. [place] 2012: 95% stenosis of the proximal LAD, 95% stenosis of the mid LAD, 30% in-stent restenosis of the mid left  circumflex with a second lesion of diffuse 50% stenosis.  The patient underwent successful PCI/BMS to the mid LAD with 0% residual stenosis, LV gram not performed  . GERD (gastroesophageal reflux disease)   . History of kidney stones   . History of prostate cancer   . Hyperlipidemia   . Hypertension   . Myocardial infarction ([place])   . Neuromuscular disorder ([place])   . Prostate cancer ([place])    a.  Status post seeding  . PVD (peripheral vascular disease) ([place])   . Stroke [facility])     Past Surgical History:  Procedure Laterality Date  . BACK SURGERY  2009/2010   x 2  . CARDIAC CATHETERIZATION  2002   stent placement [place]  . ENDOVASCULAR REPAIR/STENT GRAFT N/A 01/02/2018   Procedure: ENDOVASCULAR REPAIR/STENT GRAFT;  Surgeon: Algernon Huxley, MD;  Location: [facility];  Service: Cardiovascular;  Laterality: N/A;  . FOOT SURGERY     bilateral   . HERNIA REPAIR     bilateral   . KNEE SURGERY     right knee   . PROSTATE SURGERY  09/2009   prostate implant  . SPINE SURGERY      Social History   Socioeconomic History  . Marital status: Married    Spouse name: Not on file  . Number of  children: Not on file  . Years of education: Not on file  . Highest education level: Not on file  Occupational History  . Not on file  Social Needs  . Financial resource strain: Not on file  . Food insecurity:    Worry: Not on file    Inability: Not on file  . Transportation needs:    Medical: Not on file    Non-medical: Not on file  Tobacco Use  . Smoking status: Current Every Day Smoker    Packs/day: 0.25    Years: 56.00    Pack years: 14.00    Types: Cigarettes  . Smokeless tobacco: Never Used  . Tobacco comment: back to smoking X1 year  Substance and Sexual Activity  . Alcohol use: No    Alcohol/week: 0.0 standard drinks  . Drug use: No  . Sexual activity: Not on file  Lifestyle  . Physical activity:    Days per week: Not on file    Minutes per session: Not on file  .  Stress: Not on file  Relationships  . Social connections:    Talks on phone: Not on file    Gets together: Not on file    Attends religious service: Not on file    Active member of club or organization: Not on file    Attends meetings of clubs or organizations: Not on file    Relationship status: Not on file  . Intimate partner violence:    Fear of current or ex partner: Not on file    Emotionally abused: Not on file    Physically abused: Not on file    Forced sexual activity: Not on file  Other Topics Concern  . Not on file  Social History Narrative  . Not on file    Family History  Problem Relation Age of Onset  . Heart attack Mother   . Diabetes Mother   . Heart disease Father     Allergies  Allergen Reactions  . Lyrica [Pregabalin]     Swelling   . Prednisone     Feels like having a heart attack. Has had the injection form before and does well.     Review of Systems   Review of Systems: Negative Unless Checked Constitutional: [] Weight loss  [] Fever  [] Chills Cardiac: [] Chest pain   []  Atrial Fibrillation  [] Palpitations   [] Shortness of breath when laying flat   [] Shortness of breath with exertion. [] Shortness of breath at rest Vascular:  [] Pain in legs with walking   [] Pain in legs with standing [] Pain in legs when laying flat   [] Claudication    [] Pain in feet when laying flat    [] History of DVT   [] Phlebitis   [] Swelling in legs   [] Varicose veins   [] Non-healing ulcers Pulmonary:   [] Uses home oxygen   [] Productive cough   [] Hemoptysis   [] Wheeze  [x] COPD   [] Asthma Neurologic:  [] Dizziness   [] Seizures  [] Blackouts [] History of stroke   [] History of TIA  [] Aphasia   [] Temporary Blindness   [] Weakness or numbness in arm   [] Weakness or numbness in leg Musculoskeletal:   [] Joint swelling   [] Joint pain   [] Low back pain  []  History of Knee Replacement [] Arthritis [] back Surgeries  []  Spinal Stenosis    Hematologic:  [] Easy bruising  [] Easy bleeding    [] Hypercoagulable state   [] Anemic Gastrointestinal:  [] Diarrhea   [] Vomiting  [x] Gastroesophageal reflux/heartburn   [] Difficulty swallowing. [] Abdominal pain Genitourinary:  [] Chronic kidney  disease   [] Difficult urination  [] Anuric   [] Blood in urine [] Frequent urination  [] Burning with urination   [] Hematuria Skin:  [] Rashes   [] Ulcers [] Wounds Psychological:  [] History of anxiety   []  History of major depression  []  Memory Difficulties     Objective:   Physical Exam  BP (!) 163/89 (BP Location: Right Arm, Patient Position: Sitting)   Pulse 75   Resp 14   Ht 6' (1.829 m)   Wt 196 lb 12.8 oz (89.3 kg)   BMI 26.69 kg/m   Gen: WD/WN, NAD Head: [place]/AT, No temporalis wasting.  Ear/Nose/Throat: Hearing grossly intact, nares w/o erythema or drainage Eyes: PER, EOMI, sclera nonicteric.  Neck: Supple, no masses.  No JVD.  Pulmonary:  Good air movement, no use of accessory muscles.  Cardiac: RRR Vascular:  Vessel Right Left  Radial Palpable Palpable  Dorsalis Pedis Palpable Palpable  Posterior Tibial Palpable Palpable   Gastrointestinal: soft, non-distended. No guarding/no peritoneal signs.  Musculoskeletal: M/S 5/5 throughout.  No deformity or atrophy.  Neurologic: Pain and light touch intact in extremities.  Symmetrical.  Speech is fluent. Motor exam as listed above. Psychiatric: Judgment intact, Mood & affect appropriate for pt's clinical situation. Dermatologic: No Venous rashes. No Ulcers Noted.  No changes consistent with cellulitis. Lymph : No Cervical lymphadenopathy, no lichenification or skin changes of chronic lymphedema.      Assessment & Plan:   1. AAA (abdominal aortic aneurysm) without rupture (H[facility]) Duplex US of the aorta and iliac arteries shows a 4.3 cm AAA sac with no endoleak, likely no change in the sac compared to the previous study.  CT on 10/30/2017 measured his AAA at 4.5 cm x 5.3 cm.  Sound done on 02/11/2018 measure his diameter at 4.03 cm x 3.33 cm.   Today's study results are more consistent with results found on the CT, given the timeframe since his repair.  Recommend: Patient is status post successful endovascular repair of the AAA.   No further intervention is required at this time.   No endoleak is detected and the aneurysm sac is stable.  The patient will continue antiplatelet therapy as prescribed as well as aggressive management of hyperlipidemia. Exercise is again strongly encouraged.   However, endografts require continued surveillance with ultrasound or CT scan. This is mandatory to detect any changes that allow repressurization of the aneurysm sac.  The patient is informed that this would be asymptomatic.  The patient is reminded that lifelong routine surveillance is a necessity with an endograft. Patient will continue to follow-up at 6 month intervals with ultrasound of the aorta. - VAS [place] EVAR DUPLEX; Future  2. Essential hypertension Continue antihypertensive medications as already ordered, these medications have been reviewed and there are no changes at this time.   3. Gastroesophageal reflux disease without esophagitis Continue PPI as already ordered, this medication has been reviewed and there are no changes at this time.  Avoidence of caffeine and alcohol  Moderate elevation of the head of the bed   4. Mixed hyperlipidemia Continue statin as ordered and reviewed, no changes at this time    Current Outpatient Medications on File Prior to Visit  Medication Sig Dispense Refill  . albuterol (PROVENTIL HFA;VENTOLIN HFA) 108 (90 Base) MCG/ACT inhaler Inhale 2 puffs into the lungs every 6 (six) hours as needed for wheezing. 1 Inhaler 5  . aspirin 81 MG tablet Take 81 mg by mouth daily.    [name] atorvastatin (LIPITOR) 80 MG tablet Take 1  tablet (80 mg total) by mouth daily. 90 tablet 3  . CALCIUM PO Take 1 tablet by mouth daily.    . Cholecalciferol (VITAMIN D3) 2000 units capsule Take by mouth.    . methocarbamol  (ROBAXIN) 500 MG tablet Take 1 tablet (500 mg total) by mouth 2 (two) times daily. (Patient taking differently: Take 500-1,000 mg by mouth every 8 (eight) hours as needed. ) 180 tablet 3  . Multiple Vitamins-Minerals (MULTIVITAMIN WITH MINERALS) tablet Take 1 tablet by mouth daily.    [name] omeprazole (PRILOSEC) 40 MG capsule TAKE 1 CAPSULE BY MOUTH DAILY USUALLY 30MINUTES BEFORE BREAKFAST 90 capsule 1  . polyethylene glycol powder (GLYCOLAX/MIRALAX) powder Take 17 g by mouth as needed.     . sacubitril-valsartan (ENTRESTO) 49-51 MG Take 1 tablet by mouth 2 (two) times daily. (Patient taking differently: Take 1 tablet by mouth 2 (two) times daily. ) 60 tablet 11  . traMADol (ULTRAM) 50 MG tablet Take 1 tablet (50 mg total) by mouth every 6 (six) hours as needed for moderate pain or severe pain. 60 tablet 0  . carvedilol (COREG) 3.125 MG tablet Take 1 tablet (3.125 mg total) by mouth 2 (two) times daily. 60 tablet 3   No current facility-administered medications on file prior to visit.     There are no Patient Instructions on file for this visit. No follow-ups on file.   Kris Hartmann, NP  This note was completed with Sales executive.  Any errors are purely unintentional.

## 2018-04-16 NOTE — Telephone Encounter (Signed)
Patient coming by to pick up stool kit.

## 2018-04-22 ENCOUNTER — Telehealth: Payer: Self-pay | Admitting: Cardiovascular Disease

## 2018-04-22 ENCOUNTER — Other Ambulatory Visit
Admission: RE | Admit: 2018-04-22 | Discharge: 2018-04-22 | Disposition: A | Discharge status: Home / Self Care | Payer: PPO | Source: Ambulatory Visit | Attending: Cardiovascular Disease | Admitting: Cardiovascular Disease

## 2018-04-22 ENCOUNTER — Telehealth: Payer: Self-pay

## 2018-04-22 DIAGNOSIS — I10 Essential (primary) hypertension: Secondary | ICD-10-CM

## 2018-04-22 DIAGNOSIS — E782 Mixed hyperlipidemia: Secondary | ICD-10-CM | POA: Insufficient documentation

## 2018-04-22 DIAGNOSIS — Z79899 Other long term (current) drug therapy: Secondary | ICD-10-CM | POA: Diagnosis not present

## 2018-04-22 LAB — HEPATIC FUNCTION PANEL
ALK PHOS: 100 U/L (ref 38–126)
ALT: 12 U/L (ref 0–44)
AST: 16 U/L (ref 15–41)
Albumin: 3.5 g/dL (ref 3.5–5.0)
BILIRUBIN DIRECT: 0.2 mg/dL (ref 0.0–0.2)
BILIRUBIN INDIRECT: 1.3 mg/dL — AB (ref 0.3–0.9)
BILIRUBIN TOTAL: 1.5 mg/dL — AB (ref 0.3–1.2)
Total Protein: 6.2 g/dL — ABNORMAL LOW (ref 6.5–8.1)

## 2018-04-22 LAB — LIPID PANEL
Cholesterol: 110 mg/dL (ref 0–200)
HDL: 37 mg/dL — ABNORMAL LOW (ref >40)
LDL CALC: 62 mg/dL (ref 0–99)
Total CHOL/HDL Ratio: 3 RATIO
Triglycerides: 53 mg/dL (ref <150)
VLDL: 11 mg/dL (ref 0–40)

## 2018-04-22 MED ORDER — SACUBITRIL-VALSARTAN 49-51 MG PO TABS: 1.0000 | ORAL_TABLET | Freq: Two times a day (BID) | ORAL | 3 refills | Status: DC

## 2018-04-22 NOTE — Telephone Encounter (Signed)
Pt wife states pt lab orders are not in Epic. Please call to discuss.

## 2018-04-22 NOTE — Telephone Encounter (Signed)
I spoke [name] from [employer] she mentioned that pt is due to reapply for his [facility] Patient Assistance. Pt's Pt assistance expired 04/16/18. He will not be able to get Prescription from [employer] PA until he reapplies.

## 2018-04-22 NOTE — Telephone Encounter (Signed)
*  STAT* If patient is at the pharmacy, call can be transferred to refill team.   1. Which medications need to be refilled? (please list name of each medication and dose if known) Entresto  2. Which pharmacy/location (including street and city if local pharmacy) is medication to be sent to? Pt wife states this comes from [employer]  3. Do they need a 30 day or 90 day supply?

## 2018-04-22 NOTE — Telephone Encounter (Signed)
I spoke with pt's wife and she mentioned that she has already reapplied for Pt assistance. Pt came before christmas and gave her husbands application to [employer]. Pt's wife mentioned that she dropped off application and that it was going to be given to the nurse for filing. I didn't see any documentation as to when this took place or if application was received in pt's chart. Pt's wife was upset and not happy about application not being faxed or sent to Pt assistance program. I made pt aware that we will see if we can locate application and verify if it has been faxed to Pt's assistance program.  Please advise status of application/if anyone has pt's application and income information.

## 2018-04-22 NOTE — Telephone Encounter (Signed)
Attempted to call [employer] [phone]. High Call volume on Hold for 20 mins will attempt to return call at a later time.

## 2018-04-22 NOTE — Telephone Encounter (Signed)
Patient wife in office to check status of forms.  Anderson Malta, RN  Has forms and will fax with rx today

## 2018-04-22 NOTE — Telephone Encounter (Signed)
Application completed by Dr Rockey Situ and faxed to [employer].

## 2018-04-22 NOTE — Telephone Encounter (Signed)
Pt was seen at [facility] this am for labs. Wife reported that orders werent placed, however, pt had labs drawn and are resulted.  No other orders at this time.  Pt has f/u this afternoon w [facility] per pt report.  Advised pt to call for any further questions or concerns

## 2018-04-23 ENCOUNTER — Other Ambulatory Visit (INDEPENDENT_AMBULATORY_CARE_PROVIDER_SITE_OTHER): Payer: Self-pay

## 2018-04-23 ENCOUNTER — Other Ambulatory Visit: Payer: Medicare Other

## 2018-04-23 ENCOUNTER — Ambulatory Visit (INDEPENDENT_AMBULATORY_CARE_PROVIDER_SITE_OTHER): Payer: Self-pay | Admitting: Vascular Surgery

## 2018-04-23 ENCOUNTER — Encounter

## 2018-04-23 DIAGNOSIS — T3695XA Adverse effect of unspecified systemic antibiotic, initial encounter: Principal | ICD-10-CM

## 2018-04-23 DIAGNOSIS — K521 Toxic gastroenteritis and colitis: Secondary | ICD-10-CM

## 2018-04-23 LAB — C.DIFFICILE TOXIN: C. DIFFICILE TOXIN A: NOT DETECTED

## 2018-04-24 NOTE — Progress Notes (Signed)
Cardiology Office Note  Date:  04/25/2018   ID:  [name], DOB [birth year], MRN [id number]  PCP:  Crecencio Mc, MD   Chief Complaint  Patient presents with  . other    3 mo follow up.SOB with exertion. Medications reviewed verbally.     HPI:  73 years old with Past medical history of CAD,  posterior MI in 2002 treated with a bare-metal stent to the circumflex artery,   back surgery by Dr. Patrice Paradise x2 In 2009 and 2010,  Prostate cancer with seed implantation negative Myoview scan 2010 with an ejection fraction of 60% (2010). abnormal EKG noted on office visit in 2012,  stress test that showed anterior wall ischemia.  Cardiac catheterization was performed on January 05 2011 that showed 95% proximal LAD disease.  bare metal stent was placed ( 3.5 x 18 mm vision bare metal stent). AAA 3.7 cm In 2013, up to 4.2 ( 4.5 cm May 2018) He presents for follow up of his CAD and AAA  In follow-up today he denies any significant chest pain, Chronic mild shortness of breath Tolerating Entresto Does not monitor blood pressure at home Paperwork sent in today for [facility] assistance Recent loss of family member, very tired, has been helping to deal with their estate  Spends most of his time taking care of grand daughter  He has severe arthritis in his hands, wrist Wears a splint  Likes to restore furniture  limited in his ability to exert himself secondary to arthritic pain  EKG personally reviewed by myself on todays visit Shows normal sinus rhythm with rate 65 bpm nonspecific ST abnormality, PVCs  Other past medical history reviewed Echocardiogram September 2019 Left ventricle: The cavity size was moderately dilated. There was   mild concentric hypertrophy. Systolic function was moderately to   severely reduced. The estimated ejection fraction was in the   range of 30% to 35%. Wall motion was normal; there were no   regional wall motion abnormalities. Doppler parameters are  consistent with abnormal left ventricular relaxation (grade 1   diastolic dysfunction). - Aorta: Aortic root dimension: 39 mm (ED). - Mitral valve: There was mild regurgitation. - Left atrium: The atrium was mildly dilated. - Pulmonary arteries: Systolic pressure was within the normal   range.  Stress test December 21, 2017  T wave inversion was noted during stress in the V5 and V6 leads.  There was no ST segment deviation noted during stress.  Defect 1: There is a small defect of moderate severity present in the basal inferior and apex location.  Findings consistent with prior myocardial infarction.  This is a high risk study.  Nuclear stress EF: 16%.   Prior medical hx he is s/p back surgery with profound anemia noted after the surgery. He was admitted to the hospital after routine blood work showed a HCT of 8. In the ER was noted to have hypotension. He had a transfusion, EGD, colonoscopy.   CT of the ABD did not show a bleed. He has received an iron infusion.    PMH:   has a past medical history of AAA (abdominal aortic aneurysm) ([place]), Abnormal nuclear stress test, COPD (chronic obstructive pulmonary disease) ([place]), Coronary artery disease, GERD (gastroesophageal reflux disease), History of kidney stones, History of prostate cancer, Hyperlipidemia, Hypertension, Myocardial infarction [facility]), Neuromuscular disorder ([place]), Prostate cancer ([place]), PVD (peripheral vascular disease) ([place]), and Stroke ([place]).  PSH:    Past Surgical History:  Procedure Laterality Date  . BACK  SURGERY  2009/2010   x 2  . CARDIAC CATHETERIZATION  2002   stent placement [place]  . ENDOVASCULAR REPAIR/STENT GRAFT N/A 01/02/2018   Procedure: ENDOVASCULAR REPAIR/STENT GRAFT;  Surgeon: Algernon Huxley, MD;  Location: [facility];  Service: Cardiovascular;  Laterality: N/A;  . FOOT SURGERY     bilateral   . HERNIA REPAIR     bilateral   . KNEE SURGERY     right knee   . PROSTATE SURGERY  09/2009    prostate implant  . SPINE SURGERY      Current Outpatient Medications  Medication Sig Dispense Refill  . albuterol (PROVENTIL HFA;VENTOLIN HFA) 108 (90 Base) MCG/ACT inhaler Inhale 2 puffs into the lungs every 6 (six) hours as needed for wheezing. 1 Inhaler 5  . aspirin 81 MG tablet Take 81 mg by mouth daily.    [name] atorvastatin (LIPITOR) 80 MG tablet Take 1 tablet (80 mg total) by mouth daily. 90 tablet 3  . CALCIUM PO Take 1 tablet by mouth daily.    . carvedilol (COREG) 3.125 MG tablet Take 1 tablet (3.125 mg total) by mouth 2 (two) times daily. 60 tablet 3  . Cholecalciferol (VITAMIN D3) 2000 units capsule Take by mouth.    . levofloxacin (LEVAQUIN) 500 MG tablet Take 1 tablet (500 mg total) by mouth daily. 7 tablet 0  . methocarbamol (ROBAXIN) 500 MG tablet Take 1 tablet (500 mg total) by mouth 2 (two) times daily. (Patient taking differently: Take 500-1,000 mg by mouth every 8 (eight) hours as needed. ) 180 tablet 3  . Multiple Vitamins-Minerals (MULTIVITAMIN WITH MINERALS) tablet Take 1 tablet by mouth daily.    [name] omeprazole (PRILOSEC) 40 MG capsule TAKE 1 CAPSULE BY MOUTH DAILY USUALLY 30MINUTES BEFORE BREAKFAST 90 capsule 1  . polyethylene glycol powder (GLYCOLAX/MIRALAX) powder Take 17 g by mouth as needed.     . sacubitril-valsartan (ENTRESTO) 49-51 MG Take 1 tablet by mouth 2 (two) times daily. 180 tablet 3  . traMADol (ULTRAM) 50 MG tablet Take 1 tablet (50 mg total) by mouth every 6 (six) hours as needed for moderate pain or severe pain. 60 tablet 0   No current facility-administered medications for this visit.      Allergies:   Lyrica [pregabalin] and Prednisone   Social History:  The patient  reports that he has been smoking cigarettes. He has a 14.00 pack-year smoking history. He has never used smokeless tobacco. He reports that he does not drink alcohol or use drugs.   Family History:   family history includes Diabetes in his mother; Heart attack in his mother; Heart  disease in his father.    Review of Systems: Review of Systems  Constitutional: Negative.   Respiratory: Positive for shortness of breath.   Cardiovascular: Negative.   Gastrointestinal: Negative.   Musculoskeletal: Positive for back pain and joint pain.  Neurological: Negative.   Psychiatric/Behavioral: Negative.   All other systems reviewed and are negative.   PHYSICAL EXAM: VS:  BP (!) 148/80 (BP Location: Left Arm, Patient Position: Sitting, Cuff Size: Normal)   Pulse 65   Ht 6' (1.829 m)   Wt 198 lb (89.8 kg)   SpO2 94%   BMI 26.85 kg/m  , BMI Body mass index is 26.85 kg/m. Constitutional:  oriented to person, place, and time. No distress.  HENT:  Head: Grossly normal Eyes:  no discharge. No scleral icterus.  Neck: No JVD, no carotid bruits  Cardiovascular: Regular rate  and rhythm, no murmurs appreciated Pulmonary/Chest: Clear to auscultation bilaterally, no wheezes or rails Abdominal: Soft.  no distension.  no tenderness.  Musculoskeletal: Normal range of motion Neurological:  normal muscle tone. Coordination normal. No atrophy Skin: Skin warm and dry Psychiatric: normal affect, pleasant  Recent Labs: 01/03/2018: BUN 13; Creatinine, Ser 1.05; Hemoglobin 14.2; Magnesium 1.7; Platelets 156; Potassium 3.8; Sodium 141 04/22/2018: ALT 12    Lipid Panel Lab Results  Component Value Date   CHOL 110 04/22/2018   HDL 37 (L) 04/22/2018   LDLCALC 62 04/22/2018   TRIG 53 04/22/2018    Wt Readings from Last 3 Encounters:  04/25/18 198 lb (89.8 kg)  04/15/18 196 lb 12.8 oz (89.3 kg)  03/27/18 202 lb (91.6 kg)      ASSESSMENT AND PLAN:  Mixed hyperlipidemia -  Cholesterol is at goal on the current lipid regimen. No changes to the medications were made.  Essential hypertension  Blood pressure running high today, Given his cardiomyopathy we have recommended he increase Entresto up to 79/103 mg twice daily He will have a basic metabolic panel tomorrow Recommend he  call us with blood pressures and heart rates, will try to titrate up carvedilol as blood pressure tolerates  Dilated cardiomyopathy Medication changes as above Repeat echocardiogram in several months time   Atherosclerosis of native coronary artery with stable angina pectoris, unspecified whether native or transplanted heart [facility])  Recent stress test with only small region of ischemia Echocardiogram ejection fraction 30 to 35% Previously declined cardiac catheterization Preferred medical management with reevaluation with echocardiogram at a later date  Centrilobular emphysema ([place]) -  Still smoking, one half pack per day No desire to quit.  Discussed with him on previous visits  Tobacco abuse counseling - We have encouraged him to continue to work on weaning his cigarettes and smoking cessation. He will continue to work on this and does not want any assistance with chantix.   COPD exacerbation ([place]) -  Seen by pulmonary in [place], uses inhalers Inhalers expensive for him Recent exacerbation  AAA  endograft placement Recovered well   Total encounter time more than 25 minutes  Greater than 50% was spent in counseling and coordination of care with the patient   Disposition:   F/U  3 months   Orders Placed This Encounter  Procedures  . EKG 12-Lead     Signed, Esmond Plants, M.D., Ph.D. 04/25/2018  [facility] [place], [place] [phone]

## 2018-04-25 ENCOUNTER — Ambulatory Visit (INDEPENDENT_AMBULATORY_CARE_PROVIDER_SITE_OTHER): Payer: PPO | Admitting: Cardiovascular Disease

## 2018-04-25 ENCOUNTER — Encounter: Payer: Self-pay | Admitting: Cardiovascular Disease

## 2018-04-25 VITALS — BP 148/80 | HR 65 | Ht 72.0 in | Wt 198.0 lb

## 2018-04-25 DIAGNOSIS — Z720 Tobacco use: Secondary | ICD-10-CM | POA: Diagnosis not present

## 2018-04-25 DIAGNOSIS — E782 Mixed hyperlipidemia: Secondary | ICD-10-CM | POA: Diagnosis not present

## 2018-04-25 DIAGNOSIS — I714 Abdominal aortic aneurysm, without rupture, unspecified: Secondary | ICD-10-CM

## 2018-04-25 DIAGNOSIS — I10 Essential (primary) hypertension: Secondary | ICD-10-CM

## 2018-04-25 DIAGNOSIS — I429 Cardiomyopathy, unspecified: Secondary | ICD-10-CM

## 2018-04-25 DIAGNOSIS — J432 Centrilobular emphysema: Secondary | ICD-10-CM | POA: Diagnosis not present

## 2018-04-25 DIAGNOSIS — I25118 Atherosclerotic heart disease of native coronary artery with other forms of angina pectoris: Secondary | ICD-10-CM

## 2018-04-25 MED ORDER — SACUBITRIL-VALSARTAN 97-103 MG PO TABS: 1.0000 | ORAL_TABLET | Freq: Two times a day (BID) | ORAL | 11 refills | Status: DC

## 2018-04-25 NOTE — Patient Instructions (Addendum)
Medication Instructions:   Please increase the entresto 97/103 mg twice a day  If you need a refill on your cardiac medications before your next appointment, please call your pharmacy.    Lab work: BMP in the hospital   If you have labs (blood work) drawn today and your tests are completely normal, you will receive your results only by: [name] MyChart Message (if you have MyChart) OR . A paper copy in the mail If you have any lab test that is abnormal or we need to change your treatment, we will call you to review the results.   Testing/Procedures: No new testing needed   Follow-Up: At [facility], you and your health needs are our priority.  As part of our continuing mission to provide you with exceptional heart care, we have created designated Provider Care Teams.  These Care Teams include your primary Cardiologist (physician) and Advanced Practice Providers (APPs -  Physician Assistants and Nurse Practitioners) who all work together to provide you with the care you need, when you need it.  . You will need a follow up appointment in 3 month  . Providers on your designated Care Team:   . Murray Hodgkins, NP . Christell Faith, PA-C . Marrianne Mood, PA-C  Any Other Special Instructions Will Be Listed Below (If Applicable).  For educational health videos Log in to : [email or web address] Or : [email or web address], password : [email or web address]  Home

## 2018-04-25 NOTE — Telephone Encounter (Signed)
Called patient and notified him that I faxed the application on 1/0/2548. Suggested they call [employer] to see where they are in the process. Number provided [phone].

## 2018-04-25 NOTE — Telephone Encounter (Signed)
Wife calling back to say she spoke with [employer] and they had not received the application yet. It was faxed successfully on 04/22/2018. Re-faxed to number as given by wife and was correct. SHe was appreciative and will check back with them later.

## 2018-04-26 ENCOUNTER — Other Ambulatory Visit: Payer: Self-pay | Admitting: Physician Assistant

## 2018-04-26 ENCOUNTER — Other Ambulatory Visit: Payer: Self-pay | Admitting: Internal Medicine

## 2018-04-29 ENCOUNTER — Other Ambulatory Visit: Payer: Self-pay | Admitting: Physician Assistant

## 2018-04-29 ENCOUNTER — Other Ambulatory Visit
Admission: RE | Admit: 2018-04-29 | Discharge: 2018-04-29 | Disposition: A | Discharge status: Home / Self Care | Payer: PPO | Source: Ambulatory Visit | Attending: Cardiovascular Disease | Admitting: Cardiovascular Disease

## 2018-04-29 DIAGNOSIS — I25118 Atherosclerotic heart disease of native coronary artery with other forms of angina pectoris: Secondary | ICD-10-CM | POA: Diagnosis not present

## 2018-04-29 LAB — BASIC METABOLIC PANEL
Anion gap: 7 (ref 5–15)
BUN: 10 mg/dL (ref 8–23)
CO2: 29 mmol/L (ref 22–32)
Calcium: 8.4 mg/dL — ABNORMAL LOW (ref 8.9–10.3)
Chloride: 105 mmol/L (ref 98–111)
Creatinine, Ser: 1.02 mg/dL (ref 0.61–1.24)
GFR calc Af Amer: >60 mL/min (ref >60)
GFR calc non Af Amer: >60 mL/min (ref >60)
GLUCOSE: 113 mg/dL — AB (ref 70–99)
Potassium: 3.5 mmol/L (ref 3.5–5.1)
Sodium: 141 mmol/L (ref 135–145)

## 2018-05-06 ENCOUNTER — Telehealth: Payer: Self-pay

## 2018-05-06 NOTE — Telephone Encounter (Signed)
-----   Message from Minna Merritts, MD sent at 05/05/2018  1:13 PM EST ----- Labs good Potassium low normal Could increase citrus intake or add a potassium pill 10 meq every other day

## 2018-05-06 NOTE — Telephone Encounter (Signed)
Call to patient to review lab results. Pt verbalized understanding and stated that rather than adding a new medication to increase potassium that he would rather inc potassium in his diet.  No further questions at this time.  Advised pt to call for any further questions or concerns.

## 2018-05-10 ENCOUNTER — Telehealth: Payer: Self-pay

## 2018-05-10 ENCOUNTER — Other Ambulatory Visit: Payer: Self-pay | Admitting: *Deleted

## 2018-05-10 MED ORDER — SACUBITRIL-VALSARTAN 97-103 MG PO TABS: 1.0000 | ORAL_TABLET | Freq: Two times a day (BID) | ORAL | 3 refills | Status: DC

## 2018-05-10 NOTE — Telephone Encounter (Signed)
Prior Auth approved for Entresto 97-103MG  through 05/10/2019

## 2018-05-10 NOTE — Telephone Encounter (Signed)
Fax received from [facility] patient assistance stating that patients account is going to be suspended.  Called the number provided on the fax and spoke with Verdis Frederickson.  Verdis Frederickson stated that they received the application on 42/01/3127 and they faxed a prior auth form on 04/30/2018.  We never received the prior auth form.   The patient assistance form was filled out 04/22/2018 for the dose 49-51MG  and patient was seen 04/25/2018 and it was increased to 97-103 at that time.   Updated the patient assistance form and Stated a prior auth through covermymeds for the new dose.  Awaiting Dr. Donivan Scull Signature then I will refax the application.

## 2018-05-10 NOTE — Telephone Encounter (Addendum)
Spoke with patients wife, Loletha Carrow. (okay per DPR)  She was wanting to know what was going on with the patient assistance application.  I explained to her that when the application was filled out on 04/22/2018 it was filled out for the currently dose of entresto that the patient was on which was 49-51MG .   Patient was seen in office on 04/25/2018 and the dose was increased therefore we had to update the patient assistance form to watch the new dose. Made her aware that the application would be faxed today to patient assistance.   Patients wife stated that the patient has not increased entresto to the new dose.   Per Alvis Lemmings, patient can either pick up the new dose of Entresto 97-103MG  from [facility] or since they have not increased to the new dose they can come pick up one bottle of the 49-51MG  until patient assistance is approved.   Patients wife wanted me to call and see how much the new dose would be and if that was to much then she would pick up the current dose patient is taking.   Spoke with [employer], for the 97-103MG  for a one month supply it will be $45.   Patients wife stated that she can not afford that so she will come pick up the 49-51MG  sample.

## 2018-05-10 NOTE — Telephone Encounter (Signed)
Sample placed up front for Entresto.   Medication Samples have been provided to the patient.  Drug name: Delene Loll       Strength: 49-51       Qty: 1 bottle  LOT: [id number]  Exp.Date: 04/22  Janan Ridge 11:19 AM 05/10/2018

## 2018-05-10 NOTE — Addendum Note (Signed)
Addended by: Janan Ridge on: 05/10/2018 10:39 AM   Modules accepted: Orders

## 2018-05-14 ENCOUNTER — Ambulatory Visit (INDEPENDENT_AMBULATORY_CARE_PROVIDER_SITE_OTHER): Payer: Medicare Other | Admitting: Vascular Surgery

## 2018-05-14 ENCOUNTER — Other Ambulatory Visit (INDEPENDENT_AMBULATORY_CARE_PROVIDER_SITE_OTHER): Payer: Medicare Other

## 2018-05-15 DIAGNOSIS — M4716 Other spondylosis with myelopathy, lumbar region: Secondary | ICD-10-CM | POA: Diagnosis not present

## 2018-05-28 ENCOUNTER — Telehealth: Payer: Self-pay

## 2018-05-28 NOTE — Telephone Encounter (Signed)
Error

## 2018-05-28 NOTE — Telephone Encounter (Signed)
Okay to stay on Entresto 49/51 milligrams twice a day until he higher dose comes in

## 2018-05-28 NOTE — Telephone Encounter (Signed)
Patient calling to check status of medication assistance approval.    Patient calling the office for samples of medication:   1.  What medication and dosage are you requesting samples for? Entresto   2.  Are you currently out of this medication? 2 days left

## 2018-05-28 NOTE — Telephone Encounter (Signed)
Spoke with Shanon Brow with [employer] patient assistance.  He stated that the new dose 97/103 is not yet approved in there system.  He stated when it is approved the patient would get a call from a 1-800 number to verify shipping address.   Patient has NOT started new dose (97/103mg ) of Entreso.  He is currently still taking 49/51MG  due to not being able to afford the new dose medication.  He currently has refills for the 49/51mg  dose through patient assistance.  He states he will not start the new dose (97/103mg ) until it is approved through patient assistance.

## 2018-05-28 NOTE — Telephone Encounter (Signed)
Spoke with patient.  Made patient aware that Dr. Rockey Situ is okay with him staying on the 49/51MG  dose until patient assistance approves the higher dose for him.  Samples were placed up front for patient.  Medication Samples have been provided to the patient.  Drug name: Delene Loll      Strength: 49/51       Qty: 1 bottle  LOT: [id number]  Exp.Date: 04/22  Janan Ridge 2:24 PM 05/28/2018

## 2018-05-31 NOTE — Telephone Encounter (Signed)
Patient calling to let us know he missed the delivery from [place] yesterday and thinks it may have been medication.    He will call us back if they come back.     Would patient assistance program be delivering this via [employer]?

## 2018-05-31 NOTE — Telephone Encounter (Signed)
Spoke with Benjamine Mola from [place] patient assistance.  She stated that patient assistance does deliver via [place].   She stated that if he his missed his shipment yesterday they will attempt to redeliver it again the next business day (Friday 05/31/2018) if he misses that delivery then they will take it to the closest UPS drop off and patient will have to pick it up from there.   Called patient and made him aware of this information.

## 2018-06-03 ENCOUNTER — Telehealth: Payer: Self-pay | Admitting: Cardiovascular Disease

## 2018-06-03 NOTE — Telephone Encounter (Signed)
Please call to discuss [name], states he received it mail order, but was the wrong doseage.

## 2018-06-04 MED ORDER — SACUBITRIL-VALSARTAN 97-103 MG PO TABS: 1.0000 | ORAL_TABLET | Freq: Two times a day (BID) | ORAL | 3 refills | Status: DC

## 2018-06-04 NOTE — Telephone Encounter (Signed)
Called and spoke with [employer]. They do not have on record the increased dose at this time.  Representative said that I could fax the Entresto 97-103 mg prescription electronically to [facility] by [address]. I found this pharmacy and did this. She said it would take a few days to ship this medication and process this updated request.  Rx sent to pharmacy.

## 2018-07-12 ENCOUNTER — Telehealth: Payer: Self-pay

## 2018-07-12 NOTE — Telephone Encounter (Signed)
LMOV for patient to call back Need to confirm if they would be willing to change to a Evisit

## 2018-07-15 ENCOUNTER — Telehealth: Payer: Self-pay

## 2018-07-15 NOTE — Telephone Encounter (Signed)
Virtual Visit Pre-Appointment Phone Call  Steps For Call:  1. Confirm consent - "In the setting of the current Covid19 crisis, you are scheduled for a phone visit with your provider on 07/19/2018 at 2:40PM.  Just as we do with many in-office visits, in order for you to participate in this visit, we must obtain consent.  If you'd like, I can send this to your mychart (if signed up) or email for you to review.  Otherwise, I can obtain your verbal consent now.  All virtual visits are billed to your insurance company just like a normal visit would be.  By agreeing to a virtual visit, we'd like you to understand that the technology does not allow for your provider to perform an examination, and thus may limit your provider's ability to fully assess your condition.  Finally, though the technology is pretty good, we cannot assure that it will always work on either your or our end, and in the setting of a video visit, we may have to convert it to a phone-only visit.  In either situation, we cannot ensure that we have a secure connection.  Are you willing to proceed?"  2. Give patient instructions for WebEx download to smartphone as below if video visit  3. Advise patient to be prepared with any vital sign or heart rhythm information, their current medicines, and a piece of paper and pen handy for any instructions they may receive the day of their visit  4. Inform patient they will receive a phone call 15 minutes prior to their appointment time (may be from unknown caller ID) so they should be prepared to answer  5. Confirm that appointment type is correct in Epic appointment notes (video vs telephone)    TELEPHONE CALL NOTE  [name] has been deemed a candidate for a follow-up tele-health visit to limit community exposure during the Covid-19 pandemic. I spoke with the patient via phone to ensure availability of phone/video source, confirm preferred email & phone number, and discuss instructions  and expectations.  I reminded [name] to be prepared with any vital sign and/or heart rhythm information that could potentially be obtained via home monitoring, at the time of his visit. I reminded [name] to expect a phone call at the time of his visit if his visit.  Did the patient verbally acknowledge consent to treatment? YES Janan Ridge, CMA 07/15/2018 1:32 PM   CONSENT FOR TELE-HEALTH VISIT - PLEASE REVIEW  I hereby voluntarily request, consent and authorize [facility] and its employed or contracted physicians, physician assistants, nurse practitioners or other licensed health care professionals (the Practitioner), to provide me with telemedicine health care services (the Services") as deemed necessary by the treating Practitioner. I acknowledge and consent to receive the Services by the Practitioner via telemedicine. I understand that the telemedicine visit will involve communicating with the Practitioner through live audiovisual communication technology and the disclosure of certain medical information by electronic transmission. I acknowledge that I have been given the opportunity to request an in-person assessment or other available alternative prior to the telemedicine visit and am voluntarily participating in the telemedicine visit.  I understand that I have the right to withhold or withdraw my consent to the use of telemedicine in the course of my care at any time, without affecting my right to future care or treatment, and that the Practitioner or I may terminate the telemedicine visit at any time. I understand that I have  the right to inspect all information obtained and/or recorded in the course of the telemedicine visit and may receive copies of available information for a reasonable fee.  I understand that some of the potential risks of receiving the Services via telemedicine include:   Delay or interruption in medical evaluation due to technological equipment  failure or disruption;  Information transmitted may not be sufficient (e.g. poor resolution of images) to allow for appropriate medical decision making by the Practitioner; and/or   In rare instances, security protocols could fail, causing a breach of personal health information.  Furthermore, I acknowledge that it is my responsibility to provide information about my medical history, conditions and care that is complete and accurate to the best of my ability. I acknowledge that Practitioner's advice, recommendations, and/or decision may be based on factors not within their control, such as incomplete or inaccurate data provided by me or distortions of diagnostic images or specimens that may result from electronic transmissions. I understand that the practice of medicine is not an exact science and that Practitioner makes no warranties or guarantees regarding treatment outcomes. I acknowledge that I will receive a copy of this consent concurrently upon execution via email to the email address I last provided but may also request a printed copy by calling the office of [facility].    I understand that my insurance will be billed for this visit.   I have read or had this consent read to me.  I understand the contents of this consent, which adequately explains the benefits and risks of the Services being provided via telemedicine.   I have been provided ample opportunity to ask questions regarding this consent and the Services and have had my questions answered to my satisfaction.  I give my informed consent for the services to be provided through the use of telemedicine in my medical care  By participating in this telemedicine visit I agree to the above.

## 2018-07-17 ENCOUNTER — Ambulatory Visit: Payer: PPO | Admitting: Internal Medicine

## 2018-07-19 ENCOUNTER — Other Ambulatory Visit: Payer: Self-pay

## 2018-07-19 ENCOUNTER — Telehealth (INDEPENDENT_AMBULATORY_CARE_PROVIDER_SITE_OTHER): Payer: PPO | Admitting: Cardiovascular Disease

## 2018-07-19 DIAGNOSIS — I110 Hypertensive heart disease with heart failure: Secondary | ICD-10-CM

## 2018-07-19 DIAGNOSIS — I519 Heart disease, unspecified: Secondary | ICD-10-CM | POA: Diagnosis not present

## 2018-07-19 DIAGNOSIS — I10 Essential (primary) hypertension: Secondary | ICD-10-CM

## 2018-07-19 DIAGNOSIS — I25118 Atherosclerotic heart disease of native coronary artery with other forms of angina pectoris: Secondary | ICD-10-CM

## 2018-07-19 DIAGNOSIS — I420 Dilated cardiomyopathy: Secondary | ICD-10-CM | POA: Diagnosis not present

## 2018-07-19 DIAGNOSIS — I714 Abdominal aortic aneurysm, without rupture: Secondary | ICD-10-CM

## 2018-07-19 DIAGNOSIS — J432 Centrilobular emphysema: Secondary | ICD-10-CM | POA: Diagnosis not present

## 2018-07-19 DIAGNOSIS — J449 Chronic obstructive pulmonary disease, unspecified: Secondary | ICD-10-CM

## 2018-07-19 DIAGNOSIS — Z716 Tobacco abuse counseling: Secondary | ICD-10-CM | POA: Diagnosis not present

## 2018-07-19 NOTE — Patient Instructions (Addendum)
Medication Instructions:  No changes  If you need a refill on your cardiac medications before your next appointment, please call your pharmacy.    Lab work: No new labs needed   If you have labs (blood work) drawn today and your tests are completely normal, you will receive your results only by: [name] MyChart Message (if you have MyChart) OR . A paper copy in the mail If you have any lab test that is abnormal or we need to change your treatment, we will call you to review the results.   Testing/Procedures: We will schedule an echocardiogram in 3 months for follow-up of your cardiomyopathy, systolic dysfunction Your physician has requested that you have an echocardiogram. Echocardiography is a painless test that uses sound waves to create images of your heart. It provides your doctor with information about the size and shape of your heart and how well your heart's chambers and valves are working. This procedure takes approximately one hour. There are no restrictions for this procedure. You may get an IV, if needed, to receive an ultrasound enhancing agent through to better visualize your heart.    Follow-Up: At [facility], you and your health needs are our priority.  As part of our continuing mission to provide you with exceptional heart care, we have created designated Provider Care Teams.  These Care Teams include your primary Cardiologist (physician) and Advanced Practice Providers (APPs -  Physician Assistants and Nurse Practitioners) who all work together to provide you with the care you need, when you need it.  . You will need a follow up appointment in 12 months .   Please call our office 2 months in advance to schedule this appointment.    . Providers on your designated Care Team:   . Murray Hodgkins, NP . Christell Faith, PA-C . Marrianne Mood, PA-C  Any Other Special Instructions Will Be Listed Below (If Applicable).  For educational health videos Log in to :  [email or web address] Or : [email or web address], password : [email or web address]     Echocardiogram An echocardiogram is a procedure that uses painless sound waves (ultrasound) to produce an image of the heart. Images from an echocardiogram can provide important information about:  Signs of coronary artery disease (CAD).  Aneurysm detection. An aneurysm is a weak or damaged part of an artery wall that bulges out from the normal force of blood pumping through the body.  Heart size and shape. Changes in the size or shape of the heart can be associated with certain conditions, including heart failure, aneurysm, and CAD.  Heart muscle function.  Heart valve function.  Signs of a past heart attack.  Fluid buildup around the heart.  Thickening of the heart muscle.  A tumor or infectious growth around the heart valves. Tell a health care provider about:  Any allergies you have.  All medicines you are taking, including vitamins, herbs, eye drops, creams, and over-the-counter medicines.  Any blood disorders you have.  Any surgeries you have had.  Any medical conditions you have.  Whether you are pregnant or may be pregnant. What are the risks? Generally, this is a safe procedure. However, problems may occur, including:  Allergic reaction to dye (contrast) that may be used during the procedure. What happens before the procedure? No specific preparation is needed. You may eat and drink normally. What happens during the procedure?   An IV tube may be inserted into one of your veins.  You may receive contrast through this tube. A contrast is  an injection that improves the quality of the pictures from your heart.  A gel will be applied to your chest.  A wand-like tool (transducer) will be moved over your chest. The gel will help to transmit the sound waves from the transducer.  The sound waves will harmlessly bounce off of your heart to allow the heart images to be captured in real-time motion. The  images will be recorded on a computer. The procedure may vary among health care providers and hospitals. What happens after the procedure?  You may return to your normal, everyday life, including diet, activities, and medicines, unless your health care provider tells you not to do that. Summary  An echocardiogram is a procedure that uses painless sound waves (ultrasound) to produce an image of the heart.  Images from an echocardiogram can provide important information about the size and shape of your heart, heart muscle function, heart valve function, and fluid buildup around your heart.  You do not need to do anything to prepare before this procedure. You may eat and drink normally.  After the echocardiogram is completed, you may return to your normal, everyday life, unless your health care provider tells you not to do that. This information is not intended to replace advice given to you by your health care provider. Make sure you discuss any questions you have with your health care provider. Document Released: 03/31/2000 Document Revised: 05/06/2016 Document Reviewed: 05/06/2016 Elsevier Interactive Patient Education  2019 [employer].

## 2018-07-19 NOTE — Progress Notes (Addendum)
Virtual Visit via Telephone Note   This visit type was conducted due to national recommendations for restrictions regarding the COVID-19 Pandemic (e.g. social distancing) in an effort to limit this patient's exposure and mitigate transmission in our community.  Due to her co-morbid illnesses, this patient is at least at moderate risk for complications without adequate follow up.  This format is felt to be most appropriate for this patient at this time.  The patient did not have access to video technology/had technical difficulties with video requiring transitioning to audio format only (telephone).  All issues noted in this document were discussed and addressed.  No physical exam could be performed with this format.  Please refer to the patient's chart for her  consent to telehealth for [facility].    Date:  07/19/2018   ID:  [name], DOB [birth year], MRN [id number]  Patient Location:  [phone] [place] [address]   Provider location:   [facility], [place] office  PCP:  Crecencio Mc, MD  Cardiologist:  Patsy Baltimore  Chief Complaint:  Feels run down, Joint pain, back pain   History of Present Illness:    [name] is a 73 y.o. male who presents via audio/video conferencing for a telehealth visit today.   The patient does not symptoms concerning for COVID-19 infection (fever, chills, cough, or new SHORTNESS OF BREATH).   Patient has a past medical history of 73 years old with Past medical history of CAD, posterior MI in 2002 treated with a bare-metal stent to the circumflex artery,  back surgery by Dr. Patrice Paradise x2 In 2009 and 2010,  Prostate cancer with seed implantation negative Myoview scan 2010 with an ejection fraction of 60% (2010). abnormal EKG noted on office visit in 2012, stress test that showed anterior wall ischemia.  Cardiac catheterization was performed on January 05 2011 that showed 95% proximal LAD disease.  bare metal  stent was placed ( 3.5 x 18 mm vision bare metal stent). AAA 3.7 cm In 2013, up to 4.2 ( 4.5 cm May 2018) He presents for follow up of his CAD and AAA  denies any significant chest pain, Chronic mild shortness of breath On Entresto  Spends most of his time taking care of grand daughter,  Not spending as much time recently and the viral outbreak  severe arthritis in his hands, wrist Wears a splint  Likes to restore furniture  limited in his ability to exert himself secondary to arthritic pain  Entresto dosing was increased 97/103 twice daily on his last clinic visit in January 2020 He did not start taking higher dose medication until June 18, 2018 Receiving company assistance When he first started taking the higher dose felt somewhat fatigued, has taken him longer to get used to the higher dose Now starting to feel better  Denies significant shortness of breath, no leg swelling, no PND orthopnea No regular activities or exercise program Staying in and very sedentary secondary to virus outbreak Denies any orthostasis 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 There was mild concentric hypertrophy. Systolic function was moderately to severely reduced. The estimated ejection fraction was in the range of 30% to 35%. Wall motion was normal; there were no regional wall motion abnormalities. Doppler parameters are consistent with abnormal left ventricular relaxation (  grade 1 diastolic dysfunction). - Aorta: Aortic root dimension: 39 mm (ED). - Mitral valve: There was mild regurgitation. - Left atrium: The atrium was mildly dilated. - Pulmonary arteries: Systolic pressure was within the normal range.  Stress test December 21, 2017  T wave inversion was noted during stress in the V5 and  V6 leads.  There was no ST segment deviation noted during stress.  Defect 1: There is a small defect of moderate severity present in the basal inferior and apex location.  Findings consistent with prior myocardial infarction.  This is a high risk study.  Nuclear stress EF: 16%.   Past Medical History:  Diagnosis Date  . AAA (abdominal aortic aneurysm) ([place])    a.  CTA 7/19: measured 4.5 x 5.3 cm and greatest transverse dimensions  . Abnormal nuclear stress test    a.  Myoview 2012: anterior wall ischemia with an estimated EF of 26%.  This was a new wall motion abnormality as well as a newly reduced EF  . COPD (chronic obstructive pulmonary disease) ([place])   . Coronary artery disease    a.  Posterior MI in 2002 status post BMS to the LCx; b. [place] 2012: 95% stenosis of the proximal LAD, 95% stenosis of the mid LAD, 30% in-stent restenosis of the mid left circumflex with a second lesion of diffuse 50% stenosis.  The patient underwent successful PCI/BMS to the mid LAD with 0% residual stenosis, LV gram not performed  . GERD (gastroesophageal reflux disease)   . History of kidney stones   . History of prostate cancer   . Hyperlipidemia   . Hypertension   . Myocardial infarction ([place])   . Neuromuscular disorder ([place])   . Prostate cancer ([place])    a.  Status post seeding  . PVD (peripheral vascular disease) ([place])   . Stroke [facility])    Past Surgical History:  Procedure Laterality Date  . BACK SURGERY  2009/2010   x 2  . CARDIAC CATHETERIZATION  2002   stent placement [place]  . ENDOVASCULAR REPAIR/STENT GRAFT N/A 01/02/2018   Procedure: ENDOVASCULAR REPAIR/STENT GRAFT;  Surgeon: Algernon Huxley, MD;  Location: [facility];  Service: Cardiovascular;  Laterality: N/A;  . FOOT SURGERY     bilateral   . HERNIA REPAIR     bilateral   . KNEE SURGERY     right knee   . PROSTATE SURGERY  09/2009   prostate implant  . SPINE SURGERY       No outpatient medications have been  marked as taking for the 07/19/18 encounter (Appointment) with Minna Merritts, MD.     Allergies:   Lyrica [pregabalin] and Prednisone   Social History   Tobacco Use  . Smoking status: Current Every Day Smoker    Packs/day: 0.25    Years: 56.00    Pack years: 14.00    Types: Cigarettes  . Smokeless tobacco: Never Used  . Tobacco comment: back to smoking X1 year  Substance Use Topics  . Alcohol use: No    Alcohol/week: 0.0 standard drinks  . Drug use: No     Current Outpatient Medications on File Prior to Visit  Medication Sig Dispense Refill  . albuterol (PROVENTIL HFA;VENTOLIN HFA) 108 (90 Base) MCG/ACT inhaler Inhale 2 puffs into the lungs every 6 (six) hours as needed for wheezing. 1 Inhaler 5  . aspirin 81 MG tablet Take 81 mg by mouth daily.    [name] atorvastatin (LIPITOR) 80 MG  tablet Take 1 tablet (80 mg total) by mouth daily. 90 tablet 3  . CALCIUM PO Take 1 tablet by mouth daily.    . carvedilol (COREG) 3.125 MG tablet TAKE 1 TABLET BY MOUTH TWICE A DAY 60 tablet 3  . Cholecalciferol (VITAMIN D3) 2000 units capsule Take by mouth.    . levofloxacin (LEVAQUIN) 500 MG tablet Take 1 tablet (500 mg total) by mouth daily. 7 tablet 0  . methocarbamol (ROBAXIN) 500 MG tablet Take 1 tablet (500 mg total) by mouth 2 (two) times daily. (Patient taking differently: Take 500-1,000 mg by mouth every 8 (eight) hours as needed. ) 180 tablet 3  . Multiple Vitamins-Minerals (MULTIVITAMIN WITH MINERALS) tablet Take 1 tablet by mouth daily.    [name] omeprazole (PRILOSEC) 40 MG capsule TAKE 1 CAPSULE BY MOUTH DAILY USUALLY 30MINUTES BEFORE BREAKFAST 90 capsule 1  . polyethylene glycol powder (GLYCOLAX/MIRALAX) powder Take 17 g by mouth as needed.     . sacubitril-valsartan (ENTRESTO) 97-103 MG Take 1 tablet by mouth 2 (two) times daily. 180 tablet 3  . traMADol (ULTRAM) 50 MG tablet Take 1 tablet (50 mg total) by mouth every 6 (six) hours as needed for moderate pain or severe pain. 60 tablet 0   No  current facility-administered medications on file prior to visit.      Family Hx: The patient's family history includes Diabetes in his mother; Heart attack in his mother; Heart disease in his father.  ROS:   Please see the history of present illness.    Review of Systems  Constitutional: Positive for malaise/fatigue.  Respiratory: Negative.   Cardiovascular: Negative.   Gastrointestinal: Negative.   Musculoskeletal: Positive for back pain and joint pain.  Neurological: Negative.   Psychiatric/Behavioral: Negative.   All other systems reviewed and are negative.     Labs/Other Tests and Data Reviewed:    Recent Labs: 01/03/2018: Hemoglobin 14.2; Magnesium 1.7; Platelets 156 04/22/2018: ALT 12 04/29/2018: BUN 10; Creatinine, Ser 1.02; Potassium 3.5; Sodium 141   Recent Lipid Panel Lab Results  Component Value Date/Time   CHOL 110 04/22/2018 08:01 AM   CHOL 154 03/04/2013 08:31 AM   TRIG 53 04/22/2018 08:01 AM   HDL 37 (L) 04/22/2018 08:01 AM   HDL 39 (L) 03/04/2013 08:31 AM   CHOLHDL 3.0 04/22/2018 08:01 AM   LDLCALC 62 04/22/2018 08:01 AM   LDLCALC 88 03/04/2013 08:31 AM    Wt Readings from Last 3 Encounters:  04/25/18 198 lb (89.8 kg)  04/15/18 196 lb 12.8 oz (89.3 kg)  03/27/18 202 lb (91.6 kg)     Exam:    Vital Signs: Vital signs as detailed above in HPI  Well nourished, well developed male in no acute distress. Constitutional:  oriented to person, place, and time. No distress.    ASSESSMENT & PLAN:     Dilated cardiomyopathy Continue current medications Repeat echocardiogram in 3 months time after viral outbreak Potentially could add spironolactone though no easy way to check his lab work and follow-up given virus outbreak  Atherosclerosis of native coronary artery with stable angina pectoris, unspecified whether native or transplanted heart ([place])  stress test with only small region of ischemia Echocardiogram ejection fraction 30 to 35% Previously  declined cardiac catheterization Preferred medical management with reevaluation with echocardiogram at a later date Aggressive lipid management Long history of heavy smoking -Recommended repeat echocardiogram in 3 months time  Centrilobular emphysema ([place]) -  Still smoking, one half pack per day No desire to  quit.    Cessation recommended  Tobacco abuse counseling -  does not want any assistance with chantix.  No desire to quit  COPD exacerbation ([place]) -  Seen by pulmonary in [facility], uses inhalers Inhalers expensive for him  AAA  endograft placement Recovered well Stressed importance of smoking cessation   COVID-19 Education: The signs and symptoms of COVID-19 were discussed with the patient and how to seek care for testing (follow up with PCP or arrange E-visit).  The importance of social distancing was discussed today.  Patient Risk:   After full review of this patients clinical status, I feel that they are at least moderate risk at this time.  Time:   Today, I have spent 25 minutes with the patient with telehealth technology discussing management of cardiomyopathy, medications, other medication options available   Medication Adjustments/Labs and Tests Ordered: Current medicines are reviewed at length with the patient today.  Concerns regarding medicines are outlined above.   Tests Ordered: Echocardiogram in 3 months   Medication Changes: No changes made   Disposition: Follow-up in 6 months   Signed, Ida Rogue, MD  07/19/2018 1:38 PM    [facility] [address] [place] [address], [place], [place] [address]

## 2018-07-24 ENCOUNTER — Telehealth: Payer: Self-pay

## 2018-07-24 NOTE — Telephone Encounter (Signed)
Copied from [place] [id number]. Topic: Appointment Scheduling - Scheduling Inquiry for Clinic >> Jul 24, 2018 10:49 AM Ahmed Prima L wrote: Reason for CRM: patient would like to set up Virtual Visit for May appt with Dr Derrel Nip

## 2018-07-29 ENCOUNTER — Ambulatory Visit: Payer: PPO | Admitting: Cardiovascular Disease

## 2018-07-30 ENCOUNTER — Other Ambulatory Visit: Payer: Self-pay | Admitting: Internal Medicine

## 2018-08-20 ENCOUNTER — Telehealth: Payer: Self-pay

## 2018-08-20 ENCOUNTER — Ambulatory Visit: Payer: PPO

## 2018-08-20 NOTE — Telephone Encounter (Signed)
Spoke with pt's wife and rescheduled pt for AWV tomorrow.

## 2018-08-20 NOTE — Telephone Encounter (Signed)
Copied from [place] [phone]. Topic: General - Other >> Aug 20, 2018 12:11 PM Keene Breath wrote: Reason for CRM: Patient called to reschedule his [place] that was cancelled this month.  Patient did not understand why his appt. Was cancelled, but would like to reschedule.  CB# [phone]

## 2018-08-21 ENCOUNTER — Ambulatory Visit (INDEPENDENT_AMBULATORY_CARE_PROVIDER_SITE_OTHER): Payer: PPO

## 2018-08-21 ENCOUNTER — Ambulatory Visit: Payer: PPO

## 2018-08-21 ENCOUNTER — Other Ambulatory Visit: Payer: Self-pay

## 2018-08-21 DIAGNOSIS — Z0000 Encounter for general adult medical examination without abnormal findings: Secondary | ICD-10-CM | POA: Diagnosis not present

## 2018-08-21 NOTE — Patient Instructions (Addendum)
  [name] , Thank you for taking time to come for your Medicare Wellness Visit. I appreciate your ongoing commitment to your health goals. Please review the following plan we discussed and let me know if I can assist you in the future.   These are the goals we discussed: Goals    . Healthy Lifestyle     Try to stop smoking       This is a list of the screening recommended for you and due dates:  Health Maintenance  Topic Date Due  . Tetanus Vaccine  11/13/1962  . Flu Shot  11/16/2018  . Colon Cancer Screening  05/21/2023  .  Hepatitis C: One time screening is recommended by Center for Disease Control  (CDC) for  adults born from [birth year] through 1967.   Completed  . Pneumonia vaccines  Completed

## 2018-08-21 NOTE — Progress Notes (Signed)
Subjective:   [name] is a 73 y.o. male who presents for Medicare Annual/Subsequent preventive examination.  Review of Systems:  No ROS.  Medicare Wellness Virtual Visit.  Visual/audio telehealth visit, UTA vital signs.   See social history for additional risk factors.  Cardiac Risk Factors include: advanced age (>76men, >9 women);hypertension;male gender;smoking/ tobacco exposure     Objective:    Vitals: There were no vitals taken for this visit.  There is no height or weight on file to calculate BMI.  Advanced Directives 08/21/2018 01/02/2018 01/02/2018 12/25/2017  Does Patient Have a Medical Advance Directive? Yes Yes Yes Yes  Type of Paramedic of [place];Living will Healthcare Power of [place];Living will [place];Living will  Does patient want to make changes to medical advance directive? No - Patient declined No - Patient declined No - Patient declined No - Patient declined  Copy of [place] in Chart? No - copy requested Yes No - copy requested No - copy requested    Tobacco Social History   Tobacco Use  Smoking Status Current Every Day Smoker  . Packs/day: 0.25  . Years: 56.00  . Pack years: 14.00  . Types: Cigarettes  Smokeless Tobacco Never Used  Tobacco Comment   back to smoking X1 year     Ready to quit: Not Answered Counseling given: Not Answered Comment: back to smoking X1 year   Clinical Intake:  Pre-visit preparation completed: Yes        Diabetes: No  How often do you need to have someone help you when you read instructions, pamphlets, or other written materials from your doctor or pharmacy?: 1 - Never  Interpreter Needed?: No     Past Medical History:  Diagnosis Date  . AAA (abdominal aortic aneurysm) ([place])    a.  CTA 7/19: measured 4.5 x 5.3 cm and greatest transverse dimensions  . Abnormal nuclear stress test    a.  Myoview 2012:  anterior wall ischemia with an estimated EF of 26%.  This was a new wall motion abnormality as well as a newly reduced EF  . COPD (chronic obstructive pulmonary disease) ([place])   . Coronary artery disease    a.  Posterior MI in 2002 status post BMS to the LCx; b. [place] 2012: 95% stenosis of the proximal LAD, 95% stenosis of the mid LAD, 30% in-stent restenosis of the mid left circumflex with a second lesion of diffuse 50% stenosis.  The patient underwent successful PCI/BMS to the mid LAD with 0% residual stenosis, LV gram not performed  . GERD (gastroesophageal reflux disease)   . History of kidney stones   . History of prostate cancer   . Hyperlipidemia   . Hypertension   . Myocardial infarction ([place])   . Neuromuscular disorder ([place])   . Prostate cancer ([place])    a.  Status post seeding  . PVD (peripheral vascular disease) ([place])   . Stroke [facility])    Past Surgical History:  Procedure Laterality Date  . BACK SURGERY  2009/2010   x 2  . CARDIAC CATHETERIZATION  2002   stent placement [place]  . ENDOVASCULAR REPAIR/STENT GRAFT N/A 01/02/2018   Procedure: ENDOVASCULAR REPAIR/STENT GRAFT;  Surgeon: Algernon Huxley, MD;  Location: [facility];  Service: Cardiovascular;  Laterality: N/A;  . FOOT SURGERY     bilateral   . HERNIA REPAIR     bilateral   . KNEE  SURGERY     right knee   . PROSTATE SURGERY  09/2009   prostate implant  . SPINE SURGERY     Family History  Problem Relation Age of Onset  . Heart attack Mother   . Diabetes Mother   . Heart disease Father   . Lung cancer Daughter    Social History   Socioeconomic History  . Marital status: Married    Spouse name: Not on file  . Number of children: Not on file  . Years of education: Not on file  . Highest education level: Not on file  Occupational History  . Not on file  Social Needs  . Financial resource strain: Not hard at all  . Food insecurity:    Worry: Never true    Inability: Never true  . Transportation  needs:    Medical: No    Non-medical: No  Tobacco Use  . Smoking status: Current Every Day Smoker    Packs/day: 0.25    Years: 56.00    Pack years: 14.00    Types: Cigarettes  . Smokeless tobacco: Never Used  . Tobacco comment: back to smoking X1 year  Substance and Sexual Activity  . Alcohol use: No    Alcohol/week: 0.0 standard drinks  . Drug use: No  . Sexual activity: Not on file  Lifestyle  . Physical activity:    Days per week: Not on file    Minutes per session: Not on file  . Stress: Not at all  Relationships  . Social connections:    Talks on phone: Not on file    Gets together: Not on file    Attends religious service: Not on file    Active member of club or organization: Not on file    Attends meetings of clubs or organizations: Not on file    Relationship status: Not on file  Other Topics Concern  . Not on file  Social History Narrative  . Not on file    Outpatient Encounter Medications as of 08/21/2018  Medication Sig  . albuterol (PROVENTIL HFA;VENTOLIN HFA) 108 (90 Base) MCG/ACT inhaler Inhale 2 puffs into the lungs every 6 (six) hours as needed for wheezing.  [name] aspirin 81 MG tablet Take 81 mg by mouth daily.  [name] CALCIUM PO Take 1 tablet by mouth daily.  . carvedilol (COREG) 3.125 MG tablet TAKE 1 TABLET BY MOUTH TWICE A DAY  . Cholecalciferol (VITAMIN D3) 2000 units capsule Take by mouth.  . levofloxacin (LEVAQUIN) 500 MG tablet Take 1 tablet (500 mg total) by mouth daily.  . methocarbamol (ROBAXIN) 500 MG tablet Take 1 tablet (500 mg total) by mouth 2 (two) times daily. (Patient taking differently: Take 500-1,000 mg by mouth every 8 (eight) hours as needed. )  . Multiple Vitamins-Minerals (MULTIVITAMIN WITH MINERALS) tablet Take 1 tablet by mouth daily.  [name] omeprazole (PRILOSEC) 40 MG capsule TAKE 1 CAPSULE BY MOUTH DAILY USUALLY 30MINUTES BEFORE BREAKFAST  . polyethylene glycol powder (GLYCOLAX/MIRALAX) powder Take 17 g by mouth as needed.   .  sacubitril-valsartan (ENTRESTO) 97-103 MG Take 1 tablet by mouth 2 (two) times daily.  . traMADol (ULTRAM) 50 MG tablet Take 1 tablet (50 mg total) by mouth every 6 (six) hours as needed for moderate pain or severe pain.  [name] atorvastatin (LIPITOR) 80 MG tablet Take 1 tablet (80 mg total) by mouth daily.   No facility-administered encounter medications on file as of 08/21/2018.     Activities of Daily Living  In your present state of health, do you have any difficulty performing the following activities: 08/21/2018 01/02/2018  Hearing? N N  Vision? N N  Difficulty concentrating or making decisions? N N  Walking or climbing stairs? Y N  Comment He paces himself -  Dressing or bathing? N N  Doing errands, shopping? N N  Preparing Food and eating ? N -  Comment Wife does the cooking. Self feeds. -  Using the Toilet? N -  In the past six months, have you accidently leaked urine? N -  Comment Sees kidney specialist annually -  Do you have problems with loss of bowel control? N -  Managing your Medications? N -  Managing your Finances? Y -  Comment Wife manages finances -  Housekeeping or managing your Housekeeping? [place] responsibility with wife -  Some recent data might be hidden    Patient Care Team: Crecencio Mc, MD as PCP - General (Internal Medicine) Minna Merritts, MD as Consulting Physician (Cardiology)   Assessment:   This is a routine wellness examination for [place].  I connected with patient 08/21/18 at 11:30 AM EDT by a video/audio enabled telemedicine application and verified that I am speaking with the correct person using two identifiers. Patient stated full name and DOB. Patient gave permission to continue with virtual visit. Patient's location was at home and Nurse's location was at [place] office. Patient had technical issues with camera access, continued and completed visit via audio.   Health Screenings  Colonoscopy - 05/2013 Glaucoma -none Hearing  -demonstrates normal hearing during visit. Cholesterol - 04/2018 Dental- UTD [facility]- annual visits  Social  Alcohol intake - no     Smoking history- current. Encouraged to quit.  Illicit drug use? none Exercise - he does yard work Diet - regular BMI- discussed the importance of a healthy diet, water intake and the benefits of aerobic exercise.  Educational material provided.   Safety  Patient feels safe at home- yes Patient does have smoke detectors at home- yes Patient does wear sunscreen or protective clothing when in direct sunlight -yes Patient does wear seat belt when in a moving vehicle -yes  Activities of Daily Living Patient shares chores/household responsibilities with his wife. Denies needing assistance with: driving, feeding themselves, getting from bed to chair, getting to the toilet, bathing/showering or dressing.   No new identified risk were noted.    Depression Screen Patient denies losing interest in daily life, feeling hopeless, or crying easily over simple problems.   Medication-taking as directed and without issues.   Fall Screen Patient denies being afraid of falling or falling in the last year.   Patient is alert. Correctly identified the president of the [place] and recall of 2/3 objects. States the months of the year in reverse and counts from 20 to 1 correctly.  Immunizations The following Immunizations were discussed: Influenza, shingles, pneumonia, and tetanus.   Other Providers Patient Care Team: Crecencio Mc, MD as PCP - General (Internal Medicine) Minna Merritts, MD as Consulting Physician (Cardiology)  Exercise Activities and Dietary recommendations Current Exercise Habits: The patient does not participate in regular exercise at present  Goals    . Healthy Lifestyle     Try to stop smoking       Fall Risk Fall Risk  08/21/2018 05/09/2016 03/24/2016 03/03/2013  Falls in the past year? 0 No No No  Comment - - Emmi Telephone Survey:  data to providers prior to  load -   Depression Screen PHQ 2/9 Scores 08/21/2018 05/09/2016 03/03/2013  PHQ - 2 Score 0 0 0    Cognitive Function     6CIT Screen 08/21/2018  What Year? 0 points  What month? 0 points  What time? 0 points  Count back from 20 0 points  Months in reverse 0 points  Repeat phrase 0 points  Total Score 0    Immunization History  Administered Date(s) Administered  . Influenza Split 01/15/2012  . Influenza Whole 01/16/2011  . Influenza, High Dose Seasonal PF 01/28/2013  . Influenza,inj,Quad PF,6+ Mos 02/24/2014, 01/11/2015, 12/28/2015, 01/08/2017  . Pneumococcal Conjugate-13 05/20/2014  . Pneumococcal Polysaccharide-23 01/18/2011  . Zoster 04/15/2014   Screening Tests Health Maintenance  Topic Date Due  . TETANUS/TDAP  11/13/1962  . INFLUENZA VACCINE  11/16/2018  . COLONOSCOPY  05/21/2023  . Hepatitis C Screening  Completed  . PNA vac Low Risk Adult  Completed       Plan:    End of life planning; Advance aging; Advanced directives discussed.  Copy of current HCPOA/Living Will requested.    I have personally reviewed and noted the following in the patient's chart:   . Medical and social history . Use of alcohol, tobacco or illicit drugs  . Current medications and supplements . Functional ability and status . Nutritional status . Physical activity . Advanced directives . List of other physicians . Hospitalizations, surgeries, and ER visits in previous 12 months . Vitals . Screenings to include cognitive, depression, and falls . Referrals and appointments  In addition, I have reviewed and discussed with patient certain preventive protocols, quality metrics, and best practice recommendations. A written personalized care plan for preventive services as well as general preventive health recommendations were provided to patient.     OBrien-Blaney, Florine Sprenkle L, LPN  08/20/8125   I have reviewed the above information and agree with above.    Deborra Medina, MD

## 2018-08-27 ENCOUNTER — Telehealth: Payer: Self-pay

## 2018-08-27 ENCOUNTER — Telehealth: Payer: Self-pay | Admitting: Cardiovascular Disease

## 2018-08-27 NOTE — Telephone Encounter (Signed)
Pt c/o medication issue:  1. Name of Medication: Entresto   2. How are you currently taking this medication (dosage and times per day)? 97-103 mg po BID   3. Are you having a reaction (difficulty breathing--STAT)? No   4. What is your medication issue? Patient wants to confirm meds will be auto shipped from assistance program please call to confirm

## 2018-08-27 NOTE — Telephone Encounter (Signed)
Error

## 2018-08-28 ENCOUNTER — Ambulatory Visit: Payer: PPO | Admitting: Internal Medicine

## 2018-08-28 ENCOUNTER — Other Ambulatory Visit: Payer: Self-pay | Admitting: Physician Assistant

## 2018-08-28 NOTE — Telephone Encounter (Signed)
[employer] spoke with [name].  She made me aware that Patient assistance does not do Auto ship.  It is the patients responsibility to call 7-10 days before medication will run out.  She made me aware that they now also have a text option that will send the patient a message reminding them to call in there refill when it is time.   [name] checked the status on patients medication.  She stated that the patients last refill was Feb 2020 she he is due for another shipment but his case is cancelled because they need a letter of hardship for them to continue sending this patient his medication.   The letter of hardship can state that he will have to stay on this medication and continue paying out of pocket, any other medications that himself or his spouse is on since they share income. She stated this can be faxed to [employer] from our office or the patient can mail a letter to them.

## 2018-08-28 NOTE — Telephone Encounter (Signed)
[place] were you working on this pt's entresto? I saw open telephone note please let me know if I need to address.

## 2018-09-01 NOTE — Telephone Encounter (Signed)
Yes, please send letter.

## 2018-09-03 ENCOUNTER — Other Ambulatory Visit: Payer: Self-pay

## 2018-09-03 ENCOUNTER — Ambulatory Visit (INDEPENDENT_AMBULATORY_CARE_PROVIDER_SITE_OTHER): Payer: PPO | Admitting: Internal Medicine

## 2018-09-03 ENCOUNTER — Encounter: Payer: Self-pay | Admitting: Internal Medicine

## 2018-09-03 DIAGNOSIS — J432 Centrilobular emphysema: Secondary | ICD-10-CM | POA: Diagnosis not present

## 2018-09-03 DIAGNOSIS — M4726 Other spondylosis with radiculopathy, lumbar region: Secondary | ICD-10-CM

## 2018-09-03 DIAGNOSIS — Z79899 Other long term (current) drug therapy: Secondary | ICD-10-CM | POA: Diagnosis not present

## 2018-09-03 DIAGNOSIS — D126 Benign neoplasm of colon, unspecified: Secondary | ICD-10-CM | POA: Diagnosis not present

## 2018-09-03 DIAGNOSIS — K22719 Barrett's esophagus with dysplasia, unspecified: Secondary | ICD-10-CM | POA: Diagnosis not present

## 2018-09-03 MED ORDER — TRAMADOL HCL 50 MG PO TABS: 50.0000 mg | ORAL_TABLET | Freq: Two times a day (BID) | ORAL | 5 refills | Status: DC | PRN

## 2018-09-03 NOTE — Progress Notes (Signed)
Virtual Visit via [email or web address]  This visit type was conducted due to national recommendations for restrictions regarding the COVID-19 pandemic (e.g. social distancing).  This format is felt to be most appropriate for this patient at this time.  All issues noted in this document were discussed and addressed.  No physical exam was performed (except for noted visual exam findings with Video Visits).   I connected with@ on 09/03/18 at  3:00 PM EDT by a video enabled telemedicine application or telephone and verified that I am speaking with the correct person using two identifiers. Location patient: home Location provider: work or home office Persons participating in the virtual visit: patient, provider  I discussed the limitations, risks, security and privacy concerns of performing an evaluation and management service by telephone and the availability of in person appointments. I also discussed with the patient that there may be a patient responsible charge related to this service. The patient expressed understanding and agreed to proceed.  Reason for visit: medication refill.  Last seen by me Sept 2017 .[name]  HPI:  Dilated ischemic cardiomyopathy: managed by [facility]. No recent decompensations.   Lumbar spondylosis with right sided sciatica  Managed by [facility] in [place] last seen Jan 2019 and given #90 tramadol  By Dr Carloyn Manner who has retired  COPD: his respiratory status has been  stable.  Still smoking  But has reduced daily use to 1/2 pack daily    History of Prostate CA s/p prostatectomy.  No urinary incontinence .   Sees [facility] next month for follow up.   Hypertension: patient checks blood pressure twice weekly at home.  Readings have been for the most part  140/80 or less at rest .but has been as high as 160/80 .  Patient is following a reduce salt diet most days and is taking medications as prescribed    Colon CA screening:  His last colonoscopy was in /april 2018 and ten year follow up was  recommended  with Vira Agar    ROS: See pertinent positives and negatives per HPI.  Past Medical History:  Diagnosis Date  . AAA (abdominal aortic aneurysm) ([place])    a.  CTA 7/19: measured 4.5 x 5.3 cm and greatest transverse dimensions  . Abnormal nuclear stress test    a.  Myoview 2012: anterior wall ischemia with an estimated EF of 26%.  This was a new wall motion abnormality as well as a newly reduced EF  . COPD (chronic obstructive pulmonary disease) ([place])   . Coronary artery disease    a.  Posterior MI in 2002 status post BMS to the LCx; b. [place] 2012: 95% stenosis of the proximal LAD, 95% stenosis of the mid LAD, 30% in-stent restenosis of the mid left circumflex with a second lesion of diffuse 50% stenosis.  The patient underwent successful PCI/BMS to the mid LAD with 0% residual stenosis, LV gram not performed  . GERD (gastroesophageal reflux disease)   . History of kidney stones   . History of prostate cancer   . Hyperlipidemia   . Hypertension   . Myocardial infarction ([place])   . Neuromuscular disorder ([place])   . Prostate cancer ([place])    a.  Status post seeding  . PVD (peripheral vascular disease) ([place])   . Stroke [facility])     Past Surgical History:  Procedure Laterality Date  . BACK SURGERY  2009/2010   x 2  . CARDIAC CATHETERIZATION  2002   stent placement [place]  . ENDOVASCULAR  REPAIR/STENT GRAFT N/A 01/02/2018   Procedure: ENDOVASCULAR REPAIR/STENT GRAFT;  Surgeon: Algernon Huxley, MD;  Location: [facility];  Service: Cardiovascular;  Laterality: N/A;  . FOOT SURGERY     bilateral   . HERNIA REPAIR     bilateral   . KNEE SURGERY     right knee   . PROSTATE SURGERY  09/2009   prostate implant  . SPINE SURGERY      Family History  Problem Relation Age of Onset  . Heart attack Mother   . Diabetes Mother   . Heart disease Father   . Lung cancer Daughter     SOCIAL HX:  Social History   Tobacco Use  . Smoking status: Current Every Day Smoker     Packs/day: 0.25    Years: 56.00    Pack years: 14.00    Types: Cigarettes  . Smokeless tobacco: Never Used  . Tobacco comment: back to smoking X1 year  Substance Use Topics  . Alcohol use: No    Alcohol/week: 0.0 standard drinks  . Drug use: No     Current Outpatient Medications:  .  albuterol (PROVENTIL HFA;VENTOLIN HFA) 108 (90 Base) MCG/ACT inhaler, Inhale 2 puffs into the lungs every 6 (six) hours as needed for wheezing., Disp: 1 Inhaler, Rfl: 5 .  aspirin 81 MG tablet, Take 81 mg by mouth daily., Disp: , Rfl:  .  CALCIUM PO, Take 1 tablet by mouth daily., Disp: , Rfl:  .  carvedilol (COREG) 3.125 MG tablet, TAKE 1 TABLET BY MOUTH TWICE A DAY, Disp: 60 tablet, Rfl: 2 .  Cholecalciferol (VITAMIN D3) 2000 units capsule, Take by mouth., Disp: , Rfl:  .  methocarbamol (ROBAXIN) 500 MG tablet, Take 1 tablet (500 mg total) by mouth 2 (two) times daily. (Patient taking differently: Take 500-1,000 mg by mouth every 8 (eight) hours as needed. ), Disp: 180 tablet, Rfl: 3 .  Multiple Vitamins-Minerals (MULTIVITAMIN WITH MINERALS) tablet, Take 1 tablet by mouth daily., Disp: , Rfl:  .  omeprazole (PRILOSEC) 40 MG capsule, TAKE 1 CAPSULE BY MOUTH DAILY USUALLY 30MINUTES BEFORE BREAKFAST, Disp: 90 capsule, Rfl: 1 .  polyethylene glycol powder (GLYCOLAX/MIRALAX) powder, Take 17 g by mouth as needed. , Disp: , Rfl:  .  sacubitril-valsartan (ENTRESTO) 97-103 MG, Take 1 tablet by mouth 2 (two) times daily., Disp: 180 tablet, Rfl: 3 .  traMADol (ULTRAM) 50 MG tablet, Take 1 tablet (50 mg total) by mouth every 12 (twelve) hours as needed for moderate pain or severe pain., Disp: 60 tablet, Rfl: 5 .  atorvastatin (LIPITOR) 80 MG tablet, , Disp: , Rfl:   EXAM:  VITALS per patient if applicable:  GENERAL: alert, oriented, appears well and in no acute distress  HEENT: atraumatic, conjunttiva clear, no obvious abnormalities on inspection of external nose and ears  NECK: normal movements of the head and  neck  LUNGS: on inspection no signs of respiratory distress, breathing rate appears normal, no obvious gross SOB, gasping or wheezing  CV: no obvious cyanosis  MS: moves all visible extremities without noticeable abnormality  PSYCH/NEURO: pleasant and cooperative, no obvious depression or anxiety, speech and thought processing grossly intact  ASSESSMENT AND PLAN:  Discussed the following assessment and plan:  Long-term use of high-risk medication - Plan: Comprehensive metabolic panel  Benign neoplasm of colon, unspecified part of colon  Centrilobular emphysema (HCC)  Barrett's esophagus with dysplasia  Other spondylosis with radiculopathy, lumbar region  COLONIC POLYPS Last colonoscopy was April  2018 by RTE.  5 yr follow up is due in 2023  Centrilobular emphysema [facility]) He quit smoking in 2012, but has been smoking again Encouragement given.   Barrett's esophagus Last EGD 2018  Other spondylosis with radiculopathy, lumbar region Managed with tramadol .  Refills given     I discussed the assessment and treatment plan with the patient. The patient was provided an opportunity to ask questions and all were answered. The patient agreed with the plan and demonstrated an understanding of the instructions.   The patient was advised to call back or seek an in-person evaluation if the symptoms worsen or if the condition fails to improve as anticipated.  I provided 25 minutes of non-face-to-face time during this encounter.   Nathaniel Mc, MD

## 2018-09-04 ENCOUNTER — Encounter: Payer: Self-pay | Admitting: Internal Medicine

## 2018-09-04 DIAGNOSIS — M4726 Other spondylosis with radiculopathy, lumbar region: Secondary | ICD-10-CM | POA: Insufficient documentation

## 2018-09-04 NOTE — Assessment & Plan Note (Signed)
Managed with tramadol .  Refills given

## 2018-09-04 NOTE — Assessment & Plan Note (Signed)
Last colonoscopy was April 2018 by [facility].  5 yr follow up is due in 2023

## 2018-09-04 NOTE — Assessment & Plan Note (Signed)
He quit smoking in 2012, but has been smoking again Encouragement given.

## 2018-09-04 NOTE — Assessment & Plan Note (Signed)
Last EGD 2018

## 2018-09-06 NOTE — Telephone Encounter (Signed)
Hey Jennifer/ Lattie Haw,  Pam sent this to me, but I am not be in the office at all next week.  Do one of you mind helping with this when you are in the office?

## 2018-09-06 NOTE — Progress Notes (Signed)
Non fasting lab is scheduled and pt is aware. Controlled substance agreement has been mailed and pt is aware that he will need to sign it and mail it back to [place].

## 2018-09-11 ENCOUNTER — Encounter: Payer: Self-pay | Admitting: *Deleted

## 2018-09-11 NOTE — Telephone Encounter (Signed)
Patient's wife calling back, ok per DPR. She spoke with [employer]. They said it would be up to 10-14 days for the letter to be reviewed, approved, and the medication shipped. In the meantime, patient only has 5 days of medication left.  Patient does have plenty of samples of the Entresto 49-51 mg that were given to him prior to the increase. They would like to know if it is ok to take 2 tablets two times a day of the 49-51 mg or not until the shipment is received. We are out of samples in our office at this time.  Advised I will route to DOD, Dr End, for further advice.

## 2018-09-11 NOTE — Telephone Encounter (Signed)
Generated letter and faxed it to [employer] at [phone]. Patient and wife notified that this had been done. Patient has 5 days of medication left. We do not have samples at this time. Encouraged them to call [employer] to make sure they received the letter and to call us back if not.

## 2018-09-11 NOTE — Telephone Encounter (Signed)
Yes, it is okay for the patient to take 2 tablets of Entresto 49-51 BID until his new supply arrives from [employer].  Nelva Bush, MD [facility] Pager: [phone]

## 2018-09-11 NOTE — Telephone Encounter (Signed)
Called and spoke with wife. She verbalized understanding that ok to take 2 tablets of Entresto 49-51 mg two times a day until his new supply arrives. She was very Patent attorney.

## 2018-10-14 ENCOUNTER — Ambulatory Visit (INDEPENDENT_AMBULATORY_CARE_PROVIDER_SITE_OTHER): Payer: PPO | Admitting: Nurse Practitioner

## 2018-10-14 ENCOUNTER — Other Ambulatory Visit: Payer: Self-pay

## 2018-10-14 ENCOUNTER — Ambulatory Visit (INDEPENDENT_AMBULATORY_CARE_PROVIDER_SITE_OTHER): Payer: PPO

## 2018-10-14 ENCOUNTER — Encounter (INDEPENDENT_AMBULATORY_CARE_PROVIDER_SITE_OTHER): Payer: Self-pay | Admitting: Nurse Practitioner

## 2018-10-14 VITALS — BP 162/72 | HR 53 | Resp 16 | Wt 201.2 lb

## 2018-10-14 DIAGNOSIS — I714 Abdominal aortic aneurysm, without rupture, unspecified: Secondary | ICD-10-CM

## 2018-10-14 DIAGNOSIS — Z79899 Other long term (current) drug therapy: Secondary | ICD-10-CM

## 2018-10-14 DIAGNOSIS — J432 Centrilobular emphysema: Secondary | ICD-10-CM

## 2018-10-14 DIAGNOSIS — E782 Mixed hyperlipidemia: Secondary | ICD-10-CM

## 2018-10-14 DIAGNOSIS — Z87891 Personal history of nicotine dependence: Secondary | ICD-10-CM

## 2018-10-14 DIAGNOSIS — F17210 Nicotine dependence, cigarettes, uncomplicated: Secondary | ICD-10-CM | POA: Diagnosis not present

## 2018-10-14 NOTE — Progress Notes (Signed)
SUBJECTIVE:  Patient ID: [name], male    DOB: 11-11-1945, 73 y.o.   MRN: [id number] Chief Complaint  Patient presents with  . Follow-up    ultrasound follow up    HPI  [name] is a 73 y.o. male The patient returns to the office for surveillance of an abdominal aortic aneurysm status post stent graft placement on 01/02/2018.   Patient denies abdominal pain or back pain, no other abdominal complaints. No groin related complaints. No symptoms consistent with distal embolization No changes in claudication distance.   There have been no interval changes in his overall healthcare since his last visit.   Patient denies amaurosis fugax or TIA symptoms. There is no history of claudication or rest pain symptoms of the lower extremities. The patient denies angina or shortness of breath.   Duplex US of the aorta and iliac arteries shows a 4.14 AAA sac with no endoleak, a decrease in the sac compared to the previous study.  Past Medical History:  Diagnosis Date  . AAA (abdominal aortic aneurysm) ([place])    a.  CTA 7/19: measured 4.5 x 5.3 cm and greatest transverse dimensions  . Abnormal nuclear stress test    a.  Myoview 2012: anterior wall ischemia with an estimated EF of 26%.  This was a new wall motion abnormality as well as a newly reduced EF  . COPD (chronic obstructive pulmonary disease) ([place])   . Coronary artery disease    a.  Posterior MI in 2002 status post BMS to the LCx; b. [place] 2012: 95% stenosis of the proximal LAD, 95% stenosis of the mid LAD, 30% in-stent restenosis of the mid left circumflex with a second lesion of diffuse 50% stenosis.  The patient underwent successful PCI/BMS to the mid LAD with 0% residual stenosis, LV gram not performed  . GERD (gastroesophageal reflux disease)   . History of kidney stones   . History of prostate cancer   . Hyperlipidemia   . Hypertension   . Myocardial infarction ([place])   . Neuromuscular disorder ([place])   . Prostate cancer  ([place])    a.  Status post seeding  . PVD (peripheral vascular disease) ([place])   . Stroke [facility])     Past Surgical History:  Procedure Laterality Date  . BACK SURGERY  2009/2010   x 2  . CARDIAC CATHETERIZATION  2002   stent placement [place]  . ENDOVASCULAR REPAIR/STENT GRAFT N/A 01/02/2018   Procedure: ENDOVASCULAR REPAIR/STENT GRAFT;  Surgeon: Algernon Huxley, MD;  Location: [place] [facility];  Service: Cardiovascular;  Laterality: N/A;  . FOOT SURGERY     bilateral   . HERNIA REPAIR     bilateral   . KNEE SURGERY     right knee   . PROSTATE SURGERY  09/2009   prostate implant  . SPINE SURGERY      Social History   Socioeconomic History  . Marital status: Married    Spouse name: Not on file  . Number of children: Not on file  . Years of education: Not on file  . Highest education level: Not on file  Occupational History  . Not on file  Social Needs  . Financial resource strain: Not hard at all  . Food insecurity    Worry: Never true    Inability: Never true  . Transportation needs    Medical: No    Non-medical: No  Tobacco Use  . Smoking status: Current Every Day  Smoker    Packs/day: 0.25    Years: 56.00    Pack years: 14.00    Types: Cigarettes  . Smokeless tobacco: Never Used  . Tobacco comment: back to smoking X1 year  Substance and Sexual Activity  . Alcohol use: No    Alcohol/week: 0.0 standard drinks  . Drug use: No  . Sexual activity: Not on file  Lifestyle  . Physical activity    Days per week: Not on file    Minutes per session: Not on file  . Stress: Not at all  Relationships  . Social Herbalist on phone: Not on file    Gets together: Not on file    Attends religious service: Not on file    Active member of club or organization: Not on file    Attends meetings of clubs or organizations: Not on file    Relationship status: Not on file  . Intimate partner violence    Fear of current or ex partner: No    Emotionally abused: No     Physically abused: No    Forced sexual activity: No  Other Topics Concern  . Not on file  Social History Narrative  . Not on file    Family History  Problem Relation Age of Onset  . Heart attack Mother   . Diabetes Mother   . Heart disease Father   . Lung cancer Daughter     Allergies  Allergen Reactions  . Lyrica [Pregabalin]     Swelling   . Prednisone     Feels like having a heart attack. Has had the injection form before and does well.     Review of Systems   Review of Systems: Negative Unless Checked Constitutional: [] Weight loss  [] Fever  [] Chills Cardiac: [] Chest pain   []  Atrial Fibrillation  [] Palpitations   [] Shortness of breath when laying flat   [] Shortness of breath with exertion. [] Shortness of breath at rest Vascular:  [] Pain in legs with walking   [] Pain in legs with standing [] Pain in legs when laying flat   [] Claudication    [] Pain in feet when laying flat    [] History of DVT   [] Phlebitis   [] Swelling in legs   [] Varicose veins   [] Non-healing ulcers Pulmonary:   [] Uses home oxygen   [] Productive cough   [] Hemoptysis   [] Wheeze  [x] COPD   [] Asthma Neurologic:  [] Dizziness   [] Seizures  [] Blackouts [x] History of stroke   [] History of TIA  [] Aphasia   [] Temporary Blindness   [] Weakness or numbness in arm   [] Weakness or numbness in leg Musculoskeletal:   [] Joint swelling   [] Joint pain   [] Low back pain  []  History of Knee Replacement [] Arthritis [] back Surgeries  []  Spinal Stenosis    Hematologic:  [] Easy bruising  [] Easy bleeding   [] Hypercoagulable state   [] Anemic Gastrointestinal:  [] Diarrhea   [] Vomiting  [x] Gastroesophageal reflux/heartburn   [] Difficulty swallowing. [] Abdominal pain Genitourinary:  [] Chronic kidney disease   [] Difficult urination  [] Anuric   [] Blood in urine [] Frequent urination  [] Burning with urination   [] Hematuria Skin:  [] Rashes   [] Ulcers [] Wounds Psychological:  [] History of anxiety   []  History of major depression  []  Memory  Difficulties      OBJECTIVE:   Physical Exam  BP (!) 162/72 (BP Location: Right Arm)   Pulse (!) 53   Resp 16   Wt 201 lb 3.2 oz (91.3 kg)   BMI 27.29 kg/m  Gen: WD/WN, NAD Head: [place]/AT, No temporalis wasting.  Ear/Nose/Throat: Hearing grossly intact, nares w/o erythema or drainage Eyes: PER, EOMI, sclera nonicteric.  Neck: Supple, no masses.  No JVD.  Pulmonary:  Good air movement, no use of accessory muscles.  Cardiac: RRR Vascular:  Vessel Right Left  Radial Palpable Palpable  Dorsalis Pedis Palpable Palpable  Posterior Tibial Palpable Palpable   Gastrointestinal: soft, non-distended. No guarding/no peritoneal signs.  Musculoskeletal: M/S 5/5 throughout.  No deformity or atrophy.  Neurologic: Pain and light touch intact in extremities.  Symmetrical.  Speech is fluent. Motor exam as listed above. Psychiatric: Judgment intact, Mood & affect appropriate for pt's clinical situation. Dermatologic: No Venous rashes. No Ulcers Noted.  No changes consistent with cellulitis. Lymph : No Cervical lymphadenopathy, no lichenification or skin changes of chronic lymphedema.       ASSESSMENT AND PLAN:  1. AAA (abdominal aortic aneurysm) without rupture (HCC) Recommend: Patient is status post successful endovascular repair of the AAA.   No further intervention is required at this time.   No endoleak is detected and the aneurysm sac is stable.  The patient will continue antiplatelet therapy as prescribed as well as aggressive management of hyperlipidemia. Exercise is again strongly encouraged.   However, endografts require continued surveillance with ultrasound or CT scan. This is mandatory to detect any changes that allow repressurization of the aneurysm sac.  The patient is informed that this would be asymptomatic.  The patient is reminded that lifelong routine surveillance is a necessity with an endograft. Patient will continue to follow-up at 12 month intervals with ultrasound of  the aorta. - VAS [place] EVAR DUPLEX; Future  2. Mixed hyperlipidemia Continue statin as ordered and reviewed, no changes at this time   3. Centrilobular emphysema (HCC) Continue pulmonary medications and aerosols as already ordered, these medications have been reviewed and there are no changes at this time.    4. Personal history of tobacco use, presenting hazards to health Smoking cessation was discussed, 3-10 minutes spent on this topic specifically    Current Outpatient Medications on File Prior to Visit  Medication Sig Dispense Refill  . albuterol (PROVENTIL HFA;VENTOLIN HFA) 108 (90 Base) MCG/ACT inhaler Inhale 2 puffs into the lungs every 6 (six) hours as needed for wheezing. 1 Inhaler 5  . aspirin 81 MG tablet Take 81 mg by mouth daily.    [name] atorvastatin (LIPITOR) 80 MG tablet     . CALCIUM PO Take 1 tablet by mouth daily.    . carvedilol (COREG) 3.125 MG tablet TAKE 1 TABLET BY MOUTH TWICE A DAY 60 tablet 2  . Cholecalciferol (VITAMIN D3) 2000 units capsule Take by mouth.    . methocarbamol (ROBAXIN) 500 MG tablet Take 1 tablet (500 mg total) by mouth 2 (two) times daily. (Patient taking differently: Take 500-1,000 mg by mouth every 8 (eight) hours as needed. ) 180 tablet 3  . Multiple Vitamins-Minerals (MULTIVITAMIN WITH MINERALS) tablet Take 1 tablet by mouth daily.    [name] omeprazole (PRILOSEC) 40 MG capsule TAKE 1 CAPSULE BY MOUTH DAILY USUALLY 30MINUTES BEFORE BREAKFAST 90 capsule 1  . polyethylene glycol powder (GLYCOLAX/MIRALAX) powder Take 17 g by mouth as needed.     . sacubitril-valsartan (ENTRESTO) 97-103 MG Take 1 tablet by mouth 2 (two) times daily. 180 tablet 3  . traMADol (ULTRAM) 50 MG tablet Take 1 tablet (50 mg total) by mouth every 12 (twelve) hours as needed for moderate pain or severe pain. 60 tablet 5  No current facility-administered medications on file prior to visit.     There are no Patient Instructions on file for this visit. No follow-ups on file.    Kris Hartmann, NP  This note was completed with Sales executive.  Any errors are purely unintentional.

## 2018-10-16 DIAGNOSIS — Z8546 Personal history of malignant neoplasm of prostate: Secondary | ICD-10-CM | POA: Diagnosis not present

## 2018-10-17 ENCOUNTER — Ambulatory Visit: Payer: Self-pay | Admitting: General Surgery

## 2018-10-23 DIAGNOSIS — Z87442 Personal history of urinary calculi: Secondary | ICD-10-CM | POA: Diagnosis not present

## 2018-10-23 DIAGNOSIS — Z8546 Personal history of malignant neoplasm of prostate: Secondary | ICD-10-CM | POA: Diagnosis not present

## 2018-10-23 DIAGNOSIS — R351 Nocturia: Secondary | ICD-10-CM | POA: Diagnosis not present

## 2018-10-24 ENCOUNTER — Telehealth: Payer: Self-pay

## 2018-10-24 NOTE — Telephone Encounter (Signed)

## 2018-10-25 ENCOUNTER — Other Ambulatory Visit: Payer: Self-pay

## 2018-10-25 ENCOUNTER — Ambulatory Visit (INDEPENDENT_AMBULATORY_CARE_PROVIDER_SITE_OTHER): Payer: PPO

## 2018-10-25 ENCOUNTER — Telehealth: Payer: Self-pay | Admitting: Internal Medicine

## 2018-10-25 ENCOUNTER — Other Ambulatory Visit (INDEPENDENT_AMBULATORY_CARE_PROVIDER_SITE_OTHER): Payer: PPO

## 2018-10-25 DIAGNOSIS — I420 Dilated cardiomyopathy: Secondary | ICD-10-CM

## 2018-10-25 DIAGNOSIS — Z79899 Other long term (current) drug therapy: Secondary | ICD-10-CM | POA: Diagnosis not present

## 2018-10-25 DIAGNOSIS — I519 Heart disease, unspecified: Secondary | ICD-10-CM | POA: Diagnosis not present

## 2018-10-25 LAB — COMPREHENSIVE METABOLIC PANEL
ALT: 9 U/L (ref 0–53)
AST: 12 U/L (ref 0–37)
Albumin: 3.9 g/dL (ref 3.5–5.2)
Alkaline Phosphatase: 117 U/L (ref 39–117)
BUN: 11 mg/dL (ref 6–23)
CO2: 32 mEq/L (ref 19–32)
Calcium: 8.4 mg/dL (ref 8.4–10.5)
Chloride: 104 mEq/L (ref 96–112)
Creatinine, Ser: 1.07 mg/dL (ref 0.40–1.50)
GFR: 67.38 mL/min (ref >60.00)
Glucose, Bld: 97 mg/dL (ref 70–99)
Potassium: 4.2 mEq/L (ref 3.5–5.1)
Sodium: 142 mEq/L (ref 135–145)
Total Bilirubin: 1.1 mg/dL (ref 0.2–1.2)
Total Protein: 6.1 g/dL (ref 6.0–8.3)

## 2018-10-25 NOTE — Telephone Encounter (Signed)
Placed in red folder  

## 2018-10-25 NOTE — Telephone Encounter (Signed)
Pt dropped off DMV handicapp Placard to be filled out. Its in color folder up front. Thank you!

## 2018-10-29 ENCOUNTER — Encounter: Payer: Self-pay | Admitting: General Surgery

## 2018-10-29 ENCOUNTER — Ambulatory Visit (INDEPENDENT_AMBULATORY_CARE_PROVIDER_SITE_OTHER): Payer: PPO | Admitting: General Surgery

## 2018-10-29 ENCOUNTER — Other Ambulatory Visit: Payer: Self-pay

## 2018-10-29 VITALS — BP 155/77 | HR 60 | Temp 97.3°F | Resp 16 | Ht 72.0 in | Wt 198.2 lb

## 2018-10-29 DIAGNOSIS — L720 Epidermal cyst: Secondary | ICD-10-CM | POA: Diagnosis not present

## 2018-10-29 NOTE — Progress Notes (Signed)
Patient ID: [name], male   DOB: Sep 06, 1945, 73 y.o.   MRN: [id number]  Chief Complaint  Patient presents with  . Other     on neck and back    HPI [name] is a 73 y.o. male.  Here today for cyst on left side of neck and mid back. Neck cyst x2 months. Cyst on back many years. Neck cyst has drainage had been swollen larger than a dime, some decrease with drainage. Wife squeezes cyst on back and produces a foul smell. If the mid back lesion is left alone, he occasionally has some itching, but no pain.  HPI  Past Medical History:  Diagnosis Date  . AAA (abdominal aortic aneurysm) ([place])    a.  CTA 7/19: measured 4.5 x 5.3 cm and greatest transverse dimensions  . Abnormal nuclear stress test    a.  Myoview 2012: anterior wall ischemia with an estimated EF of 26%.  This was a new wall motion abnormality as well as a newly reduced EF  . COPD (chronic obstructive pulmonary disease) ([place])   . Coronary artery disease    a.  Posterior MI in 2002 status post BMS to the LCx; b. [place] 2012: 95% stenosis of the proximal LAD, 95% stenosis of the mid LAD, 30% in-stent restenosis of the mid left circumflex with a second lesion of diffuse 50% stenosis.  The patient underwent successful PCI/BMS to the mid LAD with 0% residual stenosis, LV gram not performed  . GERD (gastroesophageal reflux disease)   . History of kidney stones   . History of prostate cancer   . Hyperlipidemia   . Hypertension   . Myocardial infarction ([place])   . Neuromuscular disorder ([place])   . Prostate cancer ([place])    a.  Status post seeding  . PVD (peripheral vascular disease) ([place])   . Stroke [facility])     Past Surgical History:  Procedure Laterality Date  . BACK SURGERY  2009/2010   x 2  . CARDIAC CATHETERIZATION  2002   stent placement [place]  . ENDOVASCULAR REPAIR/STENT GRAFT N/A 01/02/2018   Procedure: ENDOVASCULAR REPAIR/STENT GRAFT;  Surgeon: Algernon Huxley, MD;  Location: [facility];  Service:  Cardiovascular;  Laterality: N/A;  . FOOT SURGERY     bilateral   . HERNIA REPAIR     bilateral   . KNEE SURGERY     right knee   . PROSTATE SURGERY  09/2009   prostate implant  . SPINE SURGERY      Family History  Problem Relation Age of Onset  . Heart attack Mother   . Diabetes Mother   . Heart disease Father   . Lung cancer Daughter     Social History Social History   Tobacco Use  . Smoking status: Current Every Day Smoker    Packs/day: 0.25    Years: 56.00    Pack years: 14.00    Types: Cigarettes  . Smokeless tobacco: Never Used  . Tobacco comment: back to smoking X1 year  Substance Use Topics  . Alcohol use: No    Alcohol/week: 0.0 standard drinks  . Drug use: No    Allergies  Allergen Reactions  . Lyrica [Pregabalin]     Swelling   . Prednisone     Feels like having a heart attack. Has had the injection form before and does well.    Current Outpatient Medications  Medication Sig Dispense Refill  . albuterol (PROVENTIL HFA;VENTOLIN HFA)  108 (90 Base) MCG/ACT inhaler Inhale 2 puffs into the lungs every 6 (six) hours as needed for wheezing. 1 Inhaler 5  . aspirin 81 MG tablet Take 81 mg by mouth daily.    [name] atorvastatin (LIPITOR) 80 MG tablet     . CALCIUM PO Take 1 tablet by mouth daily.    . carvedilol (COREG) 3.125 MG tablet TAKE 1 TABLET BY MOUTH TWICE A DAY 60 tablet 2  . Cholecalciferol (VITAMIN D3) 2000 units capsule Take by mouth.    . methocarbamol (ROBAXIN) 500 MG tablet Take 1 tablet (500 mg total) by mouth 2 (two) times daily. (Patient taking differently: Take 500-1,000 mg by mouth every 8 (eight) hours as needed. ) 180 tablet 3  . Multiple Vitamins-Minerals (MULTIVITAMIN WITH MINERALS) tablet Take 1 tablet by mouth daily.    [name] omeprazole (PRILOSEC) 40 MG capsule TAKE 1 CAPSULE BY MOUTH DAILY USUALLY 30MINUTES BEFORE BREAKFAST 90 capsule 1  . polyethylene glycol powder (GLYCOLAX/MIRALAX) powder Take 17 g by mouth as needed.     .  sacubitril-valsartan (ENTRESTO) 97-103 MG Take 1 tablet by mouth 2 (two) times daily. 180 tablet 3  . traMADol (ULTRAM) 50 MG tablet Take 1 tablet (50 mg total) by mouth every 12 (twelve) hours as needed for moderate pain or severe pain. 60 tablet 5   No current facility-administered medications for this visit.     Review of Systems Review of Systems  Constitutional: Negative.   Respiratory: Negative.   Cardiovascular: Negative.     Blood pressure (!) 155/77, pulse 60, temperature (!) 97.3 F (36.3 C), temperature source Temporal, resp. rate 16, height 6' (1.829 m), weight 198 lb 3.2 oz (89.9 kg), SpO2 91 %.  Physical Exam Physical Exam Constitutional:      Appearance: Normal appearance.  Skin:    General: Skin is warm and dry.       Neurological:     Mental Status: He is alert.     Data Reviewed PCP teleconference note of May 2019.   Assessment Inflamed cyst left neck.  Plan  Inflamed cyst, for excision. 10 cc 0.5% Xylocaine with 0.25% marcaine with 1: 200,000 epinephrine. Chrloraprep. Lesion excision thru an elliptical incision. 5-0 Prolene sutures x 4 for wound closure.  Telfa/ Tegaderm.  Patient to ice for comfort, tylenol if needed for pain.   Return in one week for suture removal.  Discouraged squeezing cyst on back as it may drive content into deeper tissue.    HPI, Physical Exam, Assessment and Plan have been scribed under the direction and in the presence of Robert Bellow, MD. Jonnie Finner, CMA  I have completed the exam and reviewed the above documentation for accuracy and completeness.  I agree with the above.  Haematologist has been used and any errors in dictation or transcription are unintentional.  Hervey Ard, M.D., F.A.C.S.  Forest Gleason Jammie Clink 10/29/2018, 2:57 PM

## 2018-10-29 NOTE — Patient Instructions (Signed)
We will remove your sutures next week. Shower as usual.

## 2018-11-05 ENCOUNTER — Ambulatory Visit: Payer: PPO | Admitting: *Deleted

## 2018-11-05 ENCOUNTER — Other Ambulatory Visit: Payer: Self-pay

## 2018-11-05 DIAGNOSIS — L720 Epidermal cyst: Secondary | ICD-10-CM

## 2018-11-05 NOTE — Progress Notes (Signed)
The sutures were removed and steri strips applied. 

## 2018-11-19 ENCOUNTER — Other Ambulatory Visit: Payer: Self-pay

## 2018-11-19 DIAGNOSIS — R6889 Other general symptoms and signs: Secondary | ICD-10-CM | POA: Diagnosis not present

## 2018-11-19 DIAGNOSIS — Z20822 Contact with and (suspected) exposure to covid-19: Secondary | ICD-10-CM

## 2018-11-20 LAB — NOVEL CORONAVIRUS, NAA: SARS-CoV-2, NAA: NOT DETECTED

## 2018-11-21 ENCOUNTER — Other Ambulatory Visit: Payer: Self-pay | Admitting: Cardiovascular Disease

## 2018-12-12 ENCOUNTER — Telehealth: Payer: Self-pay | Admitting: Pulmonary Disease

## 2018-12-12 MED ORDER — BUDESONIDE-FORMOTEROL FUMARATE 160-4.5 MCG/ACT IN AERO: 2.0000 | INHALATION_SPRAY | Freq: Two times a day (BID) | RESPIRATORY_TRACT | 5 refills | Status: DC

## 2018-12-12 NOTE — Telephone Encounter (Signed)
Spoke with patient's wife [name]. She stated that the patient needed refills on his Symbicort. She would like to have these printed and mailed to them as they get their medications from [place]. Verified address on file. Advised her that I would place these in the mail today. She verbalized understanding.   Nothing further needed at time of call.

## 2018-12-24 ENCOUNTER — Other Ambulatory Visit: Payer: Self-pay | Admitting: Physician Assistant

## 2019-01-10 ENCOUNTER — Other Ambulatory Visit: Payer: Self-pay

## 2019-01-10 ENCOUNTER — Ambulatory Visit (INDEPENDENT_AMBULATORY_CARE_PROVIDER_SITE_OTHER): Payer: PPO

## 2019-01-10 DIAGNOSIS — Z23 Encounter for immunization: Secondary | ICD-10-CM

## 2019-01-10 NOTE — Telephone Encounter (Signed)
Refilled: 09/03/2018 Last OV: 09/03/2018 Next OV: not scheduled

## 2019-01-11 MED ORDER — TRAMADOL HCL 50 MG PO TABS: 50.0000 mg | ORAL_TABLET | Freq: Two times a day (BID) | ORAL | 5 refills | Status: DC | PRN

## 2019-01-28 ENCOUNTER — Other Ambulatory Visit: Payer: Self-pay | Admitting: Internal Medicine

## 2019-02-03 ENCOUNTER — Telehealth: Payer: Self-pay | Admitting: Cardiovascular Disease

## 2019-02-03 NOTE — Telephone Encounter (Signed)
Please call regarding Entresto. States he needs to talk about patient assitance.

## 2019-02-04 NOTE — Telephone Encounter (Signed)
Patient had me speak with his wife. Wife said patient received letter from [facility] patient assistance saying he qualified to receive Delene Loll the remainder of the calendar year at no cost. They suggested he go ahead and start the process with his physician for next year.  Advised I could mail the patient assistance application to them to fill out and bring back to [place] to submit. She was appreciative. Application printed and mailed to patient.

## 2019-02-17 ENCOUNTER — Other Ambulatory Visit: Payer: Self-pay | Admitting: Cardiovascular Disease

## 2019-03-03 ENCOUNTER — Telehealth: Payer: Self-pay | Admitting: *Deleted

## 2019-03-03 NOTE — Telephone Encounter (Signed)
Patient mailed his 2021 [facility] Patient Assistance Application to Dr Rockey Situ to complete.  Patient was advised to start the process by [employer]. Patient included his portion of application along with appropriate documentation they need. Dr Donivan Scull portion completed and awaiting his signature. Placed in Dr Donivan Scull sign folder and routing to [facility], RN for follow up.

## 2019-03-05 NOTE — Telephone Encounter (Signed)
Paperwork completed and faxed to assistance program.

## 2019-03-07 NOTE — Telephone Encounter (Signed)
Informed patient that paperwork has been completed and faxed to company. Let him know that they would review and advise if he is approved. He verbalized understanding with no further questions at this time.

## 2019-03-18 ENCOUNTER — Telehealth: Payer: Self-pay

## 2019-03-18 ENCOUNTER — Telehealth: Payer: Self-pay | Admitting: Cardiovascular Disease

## 2019-03-18 DIAGNOSIS — L0390 Cellulitis, unspecified: Secondary | ICD-10-CM | POA: Diagnosis not present

## 2019-03-18 DIAGNOSIS — Z122 Encounter for screening for malignant neoplasm of respiratory organs: Secondary | ICD-10-CM

## 2019-03-18 DIAGNOSIS — M7022 Olecranon bursitis, left elbow: Secondary | ICD-10-CM | POA: Diagnosis not present

## 2019-03-18 DIAGNOSIS — Z87891 Personal history of nicotine dependence: Secondary | ICD-10-CM

## 2019-03-18 NOTE — Telephone Encounter (Signed)
Spoke with patients wife per release form and she received a second application. Advised that his application was faxed to them on 03/07/19 and she could try calling them to check on the status. She verbalized understanding with no further questions at this time.

## 2019-03-18 NOTE — Telephone Encounter (Signed)
Spoke with patients wife to let her know that lung cancer screening CT scan is due currently or will be in near future for Mr Guzzardo. Confirmed that patient is within the appropriate age range, and asymptomatic, (no signs or symptoms of lung cancer). Patients wife denies illness that would prevent curative treatment for lung cancer if found. Verified smoking history 1/2 ppd. Patient is agreeable for CT scan being scheduled any day after lunch time.

## 2019-03-18 NOTE — Telephone Encounter (Signed)
Patient wife has a question regarding Entresto form.

## 2019-03-20 DIAGNOSIS — M7022 Olecranon bursitis, left elbow: Secondary | ICD-10-CM | POA: Diagnosis not present

## 2019-03-24 ENCOUNTER — Telehealth: Payer: Self-pay | Admitting: *Deleted

## 2019-03-24 NOTE — Addendum Note (Signed)
Addended by: Lieutenant Diego on: 03/24/2019 10:13 AM   Modules accepted: Orders

## 2019-03-24 NOTE — Telephone Encounter (Signed)
error 

## 2019-03-24 NOTE — Telephone Encounter (Signed)
Smoking history current, 44 pack year

## 2019-03-27 DIAGNOSIS — M7022 Olecranon bursitis, left elbow: Secondary | ICD-10-CM | POA: Diagnosis not present

## 2019-03-27 DIAGNOSIS — L0390 Cellulitis, unspecified: Secondary | ICD-10-CM | POA: Diagnosis not present

## 2019-03-27 DIAGNOSIS — M1711 Unilateral primary osteoarthritis, right knee: Secondary | ICD-10-CM | POA: Diagnosis not present

## 2019-04-01 ENCOUNTER — Ambulatory Visit: Admission: RE | Admit: 2019-04-01 | Payer: PPO | Source: Ambulatory Visit

## 2019-04-01 DIAGNOSIS — M25561 Pain in right knee: Secondary | ICD-10-CM | POA: Diagnosis not present

## 2019-04-01 DIAGNOSIS — M170 Bilateral primary osteoarthritis of knee: Secondary | ICD-10-CM | POA: Diagnosis not present

## 2019-04-14 DIAGNOSIS — E538 Deficiency of other specified B group vitamins: Secondary | ICD-10-CM | POA: Diagnosis not present

## 2019-04-14 DIAGNOSIS — E782 Mixed hyperlipidemia: Secondary | ICD-10-CM | POA: Diagnosis not present

## 2019-04-14 DIAGNOSIS — J431 Panlobular emphysema: Secondary | ICD-10-CM | POA: Diagnosis not present

## 2019-04-14 DIAGNOSIS — M1711 Unilateral primary osteoarthritis, right knee: Secondary | ICD-10-CM | POA: Diagnosis not present

## 2019-04-14 DIAGNOSIS — I420 Dilated cardiomyopathy: Secondary | ICD-10-CM | POA: Diagnosis not present

## 2019-04-14 DIAGNOSIS — Z0000 Encounter for general adult medical examination without abnormal findings: Secondary | ICD-10-CM | POA: Diagnosis not present

## 2019-04-22 ENCOUNTER — Other Ambulatory Visit: Payer: Self-pay

## 2019-04-22 ENCOUNTER — Ambulatory Visit
Admission: RE | Admit: 2019-04-22 | Discharge: 2019-04-22 | Disposition: A | Discharge status: Home / Self Care | Payer: PPO | Source: Ambulatory Visit | Attending: Oncology | Admitting: Oncology

## 2019-04-22 DIAGNOSIS — Z122 Encounter for screening for malignant neoplasm of respiratory organs: Secondary | ICD-10-CM | POA: Diagnosis not present

## 2019-04-22 DIAGNOSIS — Z87891 Personal history of nicotine dependence: Secondary | ICD-10-CM | POA: Diagnosis not present

## 2019-04-22 DIAGNOSIS — F17210 Nicotine dependence, cigarettes, uncomplicated: Secondary | ICD-10-CM | POA: Diagnosis not present

## 2019-04-23 DIAGNOSIS — M1711 Unilateral primary osteoarthritis, right knee: Secondary | ICD-10-CM | POA: Diagnosis not present

## 2019-04-24 ENCOUNTER — Encounter: Payer: Self-pay | Admitting: *Deleted

## 2019-04-29 DIAGNOSIS — M1711 Unilateral primary osteoarthritis, right knee: Secondary | ICD-10-CM | POA: Diagnosis not present

## 2019-05-06 DIAGNOSIS — M1711 Unilateral primary osteoarthritis, right knee: Secondary | ICD-10-CM | POA: Diagnosis not present

## 2019-05-12 ENCOUNTER — Telehealth: Payer: Self-pay

## 2019-05-12 NOTE — Telephone Encounter (Signed)
Opened in error

## 2019-05-13 ENCOUNTER — Ambulatory Visit: Payer: PPO | Admitting: Pulmonary Disease

## 2019-05-13 DIAGNOSIS — M1711 Unilateral primary osteoarthritis, right knee: Secondary | ICD-10-CM | POA: Diagnosis not present

## 2019-05-22 ENCOUNTER — Telehealth: Payer: Self-pay

## 2019-05-22 NOTE — Telephone Encounter (Signed)
[employer] Patient [place] has approved the patient's medication at no cost from the [employer] for this year 2021.  Patient notified to contact [phone] for any questions regarding this.

## 2019-05-26 ENCOUNTER — Ambulatory Visit: Payer: PPO | Admitting: Pulmonary Disease

## 2019-07-22 ENCOUNTER — Ambulatory Visit: Payer: PPO | Admitting: Cardiovascular Disease

## 2019-07-22 ENCOUNTER — Telehealth: Payer: Self-pay | Admitting: Internal Medicine

## 2019-07-22 NOTE — Telephone Encounter (Signed)
-----   Message from Salvatore Marvel sent at 07/21/2019  4:21 PM EDT ----- Regarding: RE: remove pcp Sorry about that. Appt for 08/22/19 was cancelled with reason of changed providers  ----- Message ----- From: Larey Days Sent: 07/21/2019   4:14 PM EDT To: Salvatore Marvel Subject: RE: remove pcp                                 What is the reason for the removal of PCP? ----- Message ----- From: Salvatore Marvel Sent: 07/21/2019  11:24 AM EDT To: Larey Days Subject: remove pcp                                     Please remove pcp.

## 2019-07-22 NOTE — Telephone Encounter (Signed)
The patients former PCP has been removed

## 2019-08-13 DIAGNOSIS — J431 Panlobular emphysema: Secondary | ICD-10-CM | POA: Diagnosis not present

## 2019-08-13 DIAGNOSIS — Z0000 Encounter for general adult medical examination without abnormal findings: Secondary | ICD-10-CM | POA: Diagnosis not present

## 2019-08-13 DIAGNOSIS — E782 Mixed hyperlipidemia: Secondary | ICD-10-CM | POA: Diagnosis not present

## 2019-08-13 DIAGNOSIS — M519 Unspecified thoracic, thoracolumbar and lumbosacral intervertebral disc disorder: Secondary | ICD-10-CM | POA: Diagnosis not present

## 2019-08-13 DIAGNOSIS — I420 Dilated cardiomyopathy: Secondary | ICD-10-CM | POA: Diagnosis not present

## 2019-08-21 ENCOUNTER — Other Ambulatory Visit: Payer: Self-pay | Admitting: General Surgery

## 2019-08-21 DIAGNOSIS — L720 Epidermal cyst: Secondary | ICD-10-CM | POA: Diagnosis not present

## 2019-08-21 DIAGNOSIS — L723 Sebaceous cyst: Secondary | ICD-10-CM | POA: Diagnosis not present

## 2019-08-22 ENCOUNTER — Ambulatory Visit: Payer: PPO

## 2019-08-26 LAB — SURGICAL PATHOLOGY

## 2019-09-02 ENCOUNTER — Other Ambulatory Visit: Payer: Self-pay

## 2019-09-02 ENCOUNTER — Ambulatory Visit: Payer: PPO | Admitting: Cardiovascular Disease

## 2019-09-02 ENCOUNTER — Encounter: Payer: Self-pay | Admitting: Cardiovascular Disease

## 2019-09-02 VITALS — BP 158/86 | HR 58 | Ht 71.0 in | Wt 195.0 lb

## 2019-09-02 DIAGNOSIS — I25118 Atherosclerotic heart disease of native coronary artery with other forms of angina pectoris: Secondary | ICD-10-CM | POA: Diagnosis not present

## 2019-09-02 DIAGNOSIS — J432 Centrilobular emphysema: Secondary | ICD-10-CM

## 2019-09-02 DIAGNOSIS — I10 Essential (primary) hypertension: Secondary | ICD-10-CM

## 2019-09-02 DIAGNOSIS — I420 Dilated cardiomyopathy: Secondary | ICD-10-CM

## 2019-09-02 DIAGNOSIS — I714 Abdominal aortic aneurysm, without rupture, unspecified: Secondary | ICD-10-CM

## 2019-09-02 DIAGNOSIS — Z720 Tobacco use: Secondary | ICD-10-CM | POA: Diagnosis not present

## 2019-09-02 NOTE — Patient Instructions (Signed)

## 2019-09-02 NOTE — Progress Notes (Signed)
Date:  09/02/2019   ID:  [name], DOB 1945/05/18, MRN [id number]  Patient Location:  [id number] [place] [id number]   Provider location:   [facility], [place] office  PCP:  Nathaniel Mc, MD  Cardiologist:  [name]  Chief Complaint  Patient presents with  . Other    12 month follow up. meds reviewed verbally with patient.     History of Present Illness:    [name] is a 74 y.o. male  past medical history of CAD, posterior MI in 2002 treated with a bare-metal stent to the circumflex artery,  back surgery by Dr. Patrice Paradise x2 In 2009 and 2010,  Prostate cancer with seed implantation negative Myoview scan 2010 with an ejection fraction of 60% (2010). abnormal EKG noted on office visit in 2012, stress test that showed anterior wall ischemia.  Cardiac catheterization was performed on January 05 2011 that showed 95% proximal LAD disease.  bare metal stent was placed ( 3.5 x 18 mm vision bare metal stent). AAA 3.7 cm In 2013, up to 4.2 ( 4.5 cm May 2018) He presents for follow up of his CAD and AAA  In follow-up today reports having chronic knee pain Some stress at home wife getting set up for peritoneal HD  denies any significant chest pain, Chronic mild shortness of breath No regular exercise program Tolerating Entresto 97/103 BID  BP at home [id number] systolic Does not want to add additional medications at this time  Lab work reviewed CR 1.1  severe arthritis in his hands, wrist Wears a splint  Likes to restore furniture, limited by arthritides  EKG personally reviewed by myself on todays visit Shows normal sinus rhythm/sinus bradycardia rate 58 bpm LVH with repolarization abnormality   Prior CV studies:   The following studies were reviewed today:  Echocardiogram September 2019 Left ventricle: The cavity size was moderately dilated. There was mild concentric hypertrophy. Systolic function was moderately  to severely reduced. The estimated ejection fraction was in the range of 30% to 35%. Wall motion was normal; there were no regional wall motion abnormalities. Doppler parameters are consistent with abnormal left ventricular relaxation (grade 1 diastolic dysfunction). - Aorta: Aortic root dimension: 39 mm (ED). - Mitral valve: There was mild regurgitation. - Left atrium: The atrium was mildly dilated. - Pulmonary arteries: Systolic pressure was within the normal range.  Stress test December 21, 2017  T wave inversion was noted during stress in the V5 and V6 leads.  There was no ST segment deviation noted during stress.  Defect 1: There is a small defect of moderate severity present in the basal inferior and apex location.  Findings consistent with prior myocardial infarction.  This is a high risk study.  Nuclear stress EF: 16%.   Past Medical History:  Diagnosis Date  . AAA (abdominal aortic aneurysm) ([place])    a.  CTA 7/19: measured 4.5 x 5.3 cm and greatest transverse dimensions  . Abnormal nuclear stress test    a.  Myoview 2012: anterior wall ischemia with an estimated EF of 26%.  This was a new wall motion abnormality as well as a newly reduced EF  . COPD (chronic obstructive pulmonary disease) ([place])   . Coronary artery disease    a.  Posterior MI in 2002 status post BMS to the LCx; b. [place] 2012: 95% stenosis of the proximal LAD, 95% stenosis of the mid LAD, 30% in-stent restenosis of the  mid left circumflex with a second lesion of diffuse 50% stenosis.  The patient underwent successful PCI/BMS to the mid LAD with 0% residual stenosis, LV gram not performed  . GERD (gastroesophageal reflux disease)   . History of kidney stones   . History of prostate cancer   . Hyperlipidemia   . Hypertension   . Myocardial infarction ([place])   . Neuromuscular disorder ([place])   . Prostate cancer ([place])    a.  Status post seeding  . PVD (peripheral vascular disease) ([place])   .  Stroke [facility])    Past Surgical History:  Procedure Laterality Date  . BACK SURGERY  2009/2010   x 2  . CARDIAC CATHETERIZATION  2002   stent placement [place]  . ENDOVASCULAR REPAIR/STENT GRAFT N/A 01/02/2018   Procedure: ENDOVASCULAR REPAIR/STENT GRAFT;  Surgeon: Algernon Huxley, MD;  Location: [place] CV LAB;  Service: Cardiovascular;  Laterality: N/A;  . FOOT SURGERY     bilateral   . HERNIA REPAIR     bilateral   . KNEE SURGERY     right knee   . PROSTATE SURGERY  09/2009   prostate implant  . SPINE SURGERY       Current Meds  Medication Sig  . albuterol (PROVENTIL HFA;VENTOLIN HFA) 108 (90 Base) MCG/ACT inhaler Inhale 2 puffs into the lungs every 6 (six) hours as needed for wheezing.  [name] aspirin 81 MG tablet Take 81 mg by mouth daily.  [name] atorvastatin (LIPITOR) 80 MG tablet TAKE 1 TABLET BY MOUTH DAILY  . budesonide-formoterol (SYMBICORT) 160-4.5 MCG/ACT inhaler Inhale 2 puffs into the lungs 2 (two) times daily.  [name] CALCIUM PO Take 1 tablet by mouth daily.  . carvedilol (COREG) 3.125 MG tablet TAKE 1 TABLET BY MOUTH TWICE A DAY  . Cholecalciferol (VITAMIN D3) 2000 units capsule Take by mouth.  . methocarbamol (ROBAXIN) 500 MG tablet Take 1 tablet (500 mg total) by mouth 2 (two) times daily. (Patient taking differently: Take 500-1,000 mg by mouth every 8 (eight) hours as needed. )  . Multiple Vitamins-Minerals (MULTIVITAMIN WITH MINERALS) tablet Take 1 tablet by mouth daily.  [name] omeprazole (PRILOSEC) 40 MG capsule TAKE 1 CAPSULE BY MOUTH DAILY USUALLY 30MINUTES BEFORE BREAKFAST  . polyethylene glycol powder (GLYCOLAX/MIRALAX) powder Take 17 g by mouth as needed.   . sacubitril-valsartan (ENTRESTO) 97-103 MG Take 1 tablet by mouth 2 (two) times daily.  . traMADol (ULTRAM) 50 MG tablet Take 1 tablet (50 mg total) by mouth every 12 (twelve) hours as needed for moderate pain or severe pain.     Allergies:   Lyrica [pregabalin] and Prednisone   Social History   Tobacco Use  .  Smoking status: Current Every Day Smoker    Packs/day: 0.25    Years: 56.00    Pack years: 14.00    Types: Cigarettes  . Smokeless tobacco: Never Used  . Tobacco comment: back to smoking X1 year  Substance Use Topics  . Alcohol use: No    Alcohol/week: 0.0 standard drinks  . Drug use: No     Current Outpatient Medications on File Prior to Visit  Medication Sig Dispense Refill  . albuterol (PROVENTIL HFA;VENTOLIN HFA) 108 (90 Base) MCG/ACT inhaler Inhale 2 puffs into the lungs every 6 (six) hours as needed for wheezing. 1 Inhaler 5  . aspirin 81 MG tablet Take 81 mg by mouth daily.    [name] atorvastatin (LIPITOR) 80 MG tablet TAKE 1 TABLET BY MOUTH DAILY 90 tablet 3  .  budesonide-formoterol (SYMBICORT) 160-4.5 MCG/ACT inhaler Inhale 2 puffs into the lungs 2 (two) times daily. 1 Inhaler 5  . CALCIUM PO Take 1 tablet by mouth daily.    . carvedilol (COREG) 3.125 MG tablet TAKE 1 TABLET BY MOUTH TWICE A DAY 60 tablet 2  . Cholecalciferol (VITAMIN D3) 2000 units capsule Take by mouth.    . methocarbamol (ROBAXIN) 500 MG tablet Take 1 tablet (500 mg total) by mouth 2 (two) times daily. (Patient taking differently: Take 500-1,000 mg by mouth every 8 (eight) hours as needed. ) 180 tablet 3  . Multiple Vitamins-Minerals (MULTIVITAMIN WITH MINERALS) tablet Take 1 tablet by mouth daily.    [name] omeprazole (PRILOSEC) 40 MG capsule TAKE 1 CAPSULE BY MOUTH DAILY USUALLY 30MINUTES BEFORE BREAKFAST 90 capsule 1  . polyethylene glycol powder (GLYCOLAX/MIRALAX) powder Take 17 g by mouth as needed.     . sacubitril-valsartan (ENTRESTO) 97-103 MG Take 1 tablet by mouth 2 (two) times daily. 180 tablet 3  . traMADol (ULTRAM) 50 MG tablet Take 1 tablet (50 mg total) by mouth every 12 (twelve) hours as needed for moderate pain or severe pain. 60 tablet 5   No current facility-administered medications on file prior to visit.     Family Hx: The patient's family history includes Diabetes in his mother; Heart attack  in his mother; Heart disease in his father; Lung cancer in his daughter.  ROS:   Please see the history of present illness.    Review of Systems  Constitutional: Negative.   HENT: Negative.   Respiratory: Negative.   Cardiovascular: Negative.   Gastrointestinal: Negative.   Musculoskeletal: Positive for back pain and joint pain.  Neurological: Negative.   Psychiatric/Behavioral: Negative.   All other systems reviewed and are negative.    Labs/Other Tests and Data Reviewed:    Recent Labs: 10/25/2018: ALT 9; BUN 11; Creatinine, Ser 1.07; Potassium 4.2; Sodium 142   Recent Lipid Panel Lab Results  Component Value Date/Time   CHOL 110 04/22/2018 08:01 AM   CHOL 154 03/04/2013 08:31 AM   TRIG 53 04/22/2018 08:01 AM   HDL 37 (L) 04/22/2018 08:01 AM   HDL 39 (L) 03/04/2013 08:31 AM   CHOLHDL 3.0 04/22/2018 08:01 AM   LDLCALC 62 04/22/2018 08:01 AM   LDLCALC 88 03/04/2013 08:31 AM    Wt Readings from Last 3 Encounters:  09/02/19 195 lb (88.5 kg)  04/22/19 195 lb (88.5 kg)  10/29/18 198 lb 3.2 oz (89.9 kg)     Exam:    BP (!) 158/86 (BP Location: Left Arm, Patient Position: Sitting, Cuff Size: Normal)   Pulse (!) 58   Ht 5\' 11"  (1.803 m)   Wt 195 lb (88.5 kg)   SpO2 94%   BMI 27.20 kg/m   Constitutional:  oriented to person, place, and time. No distress.  HENT:  Head: Grossly normal Eyes:  no discharge. No scleral icterus.  Neck: No JVD, no carotid bruits  Cardiovascular: Regular rate and rhythm, no murmurs appreciated Pulmonary/Chest: Clear to auscultation bilaterally, no wheezes or rails Abdominal: Soft.  no distension.  no tenderness.  Musculoskeletal: Normal range of motion Neurological:  normal muscle tone. Coordination normal. No atrophy Skin: Skin warm and dry Psychiatric: normal affect, pleasant   ASSESSMENT & PLAN:     Dilated cardiomyopathy He prefers no changes to his medications at this time Continue Entresto, Coreg Appears  euvolemic  Atherosclerosis of native coronary artery with stable angina pectoris, unspecified whether native or transplanted heart (  [place])  stress test with only small region of ischemia Echocardiogram ejection fraction 30 to 35% Previously declined cardiac catheterization Continue Entresto and carvedilol Declining spironolactone at this time  Centrilobular emphysema (HCC) -  Still smoking, one half pack per day Smoking cessation recommended  Tobacco abuse counseling -  does not want any assistance with chantix.  No desire to quit  COPD exacerbation ([place]) -  Uses inhalers, denies any recent exacerbation  AAA  endograft placement Stressed importance of smoking cessation   Total encounter time more than 25 minutes  Greater than 50% was spent in counseling and coordination of care with the patient  Disposition: Follow-up in 6 months   Signed, Ida Rogue, MD  09/02/2019 4:01 PM    [place] Office [address], [place], [place] [address]

## 2019-10-14 ENCOUNTER — Other Ambulatory Visit: Payer: Self-pay

## 2019-10-14 ENCOUNTER — Encounter (INDEPENDENT_AMBULATORY_CARE_PROVIDER_SITE_OTHER): Payer: Self-pay | Admitting: Vascular Surgery

## 2019-10-14 ENCOUNTER — Ambulatory Visit (INDEPENDENT_AMBULATORY_CARE_PROVIDER_SITE_OTHER): Payer: PPO

## 2019-10-14 ENCOUNTER — Ambulatory Visit (INDEPENDENT_AMBULATORY_CARE_PROVIDER_SITE_OTHER): Payer: PPO | Admitting: Vascular Surgery

## 2019-10-14 VITALS — BP 172/83 | HR 56 | Resp 16 | Wt 195.6 lb

## 2019-10-14 DIAGNOSIS — I714 Abdominal aortic aneurysm, without rupture, unspecified: Secondary | ICD-10-CM

## 2019-10-14 DIAGNOSIS — E782 Mixed hyperlipidemia: Secondary | ICD-10-CM

## 2019-10-14 DIAGNOSIS — I10 Essential (primary) hypertension: Secondary | ICD-10-CM

## 2019-10-14 NOTE — Assessment & Plan Note (Signed)
blood pressure control important in reducing the progression of atherosclerotic disease and aneurysmal growth. On appropriate oral medications.  

## 2019-10-14 NOTE — Progress Notes (Signed)
MRN : [id number]  [name] is a 74 y.o. (1946/01/15) male who presents with chief complaint of  Chief Complaint  Patient presents with  . Follow-up    ultrasound follow up  .  History of Present Illness: Patient returns today in follow up of his abdominal aortic aneurysm.  He is almost 2 years status post repair of his aneurysm with a stent graft.  He is doing well.  He denies any current complaints or problems related to the aneurysm.  No signs of peripheral embolization, abdominal pain or back pain.  He does have severe arthritis in his legs that is his biggest issue.  His duplex today shows a patent stent graft without evidence of endoleak and a stable aneurysm sac size of approximately 4.2 cm in maximal diameter.  Current Outpatient Medications  Medication Sig Dispense Refill  . albuterol (PROVENTIL HFA;VENTOLIN HFA) 108 (90 Base) MCG/ACT inhaler Inhale 2 puffs into the lungs every 6 (six) hours as needed for wheezing. 1 Inhaler 5  . aspirin 81 MG tablet Take 81 mg by mouth daily.    [name] atorvastatin (LIPITOR) 80 MG tablet TAKE 1 TABLET BY MOUTH DAILY 90 tablet 3  . budesonide-formoterol (SYMBICORT) 160-4.5 MCG/ACT inhaler Inhale 2 puffs into the lungs 2 (two) times daily. 1 Inhaler 5  . CALCIUM PO Take 1 tablet by mouth daily.    . carvedilol (COREG) 3.125 MG tablet TAKE 1 TABLET BY MOUTH TWICE A DAY 60 tablet 2  . Cholecalciferol (VITAMIN D3) 2000 units capsule Take by mouth.    . methocarbamol (ROBAXIN) 500 MG tablet Take 1 tablet (500 mg total) by mouth 2 (two) times daily. (Patient taking differently: Take 500-1,000 mg by mouth every 8 (eight) hours as needed. ) 180 tablet 3  . Multiple Vitamins-Minerals (MULTIVITAMIN WITH MINERALS) tablet Take 1 tablet by mouth daily.    [name] omeprazole (PRILOSEC) 40 MG capsule TAKE 1 CAPSULE BY MOUTH DAILY USUALLY 30MINUTES BEFORE BREAKFAST 90 capsule 1  . polyethylene glycol powder (GLYCOLAX/MIRALAX) powder Take 17 g by mouth as needed.     .  sacubitril-valsartan (ENTRESTO) 97-103 MG Take 1 tablet by mouth 2 (two) times daily. 180 tablet 3  . traMADol (ULTRAM) 50 MG tablet Take 1 tablet (50 mg total) by mouth every 12 (twelve) hours as needed for moderate pain or severe pain. 60 tablet 5   No current facility-administered medications for this visit.    Past Medical History:  Diagnosis Date  . AAA (abdominal aortic aneurysm) ([place])    a.  CTA 7/19: measured 4.5 x 5.3 cm and greatest transverse dimensions  . Abnormal nuclear stress test    a.  Myoview 2012: anterior wall ischemia with an estimated EF of 26%.  This was a new wall motion abnormality as well as a newly reduced EF  . COPD (chronic obstructive pulmonary disease) ([place])   . Coronary artery disease    a.  Posterior MI in 2002 status post BMS to the LCx; b. [place] 2012: 95% stenosis of the proximal LAD, 95% stenosis of the mid LAD, 30% in-stent restenosis of the mid left circumflex with a second lesion of diffuse 50% stenosis.  The patient underwent successful PCI/BMS to the mid LAD with 0% residual stenosis, LV gram not performed  . GERD (gastroesophageal reflux disease)   . History of kidney stones   . History of prostate cancer   . Hyperlipidemia   . Hypertension   . Myocardial infarction ([place])   .  Neuromuscular disorder ([place])   . Prostate cancer ([place])    a.  Status post seeding  . PVD (peripheral vascular disease) ([place])   . Stroke [facility])     Past Surgical History:  Procedure Laterality Date  . BACK SURGERY  2009/2010   x 2  . CARDIAC CATHETERIZATION  2002   stent placement [place]  . ENDOVASCULAR REPAIR/STENT GRAFT N/A 01/02/2018   Procedure: ENDOVASCULAR REPAIR/STENT GRAFT;  Surgeon: Algernon Huxley, MD;  Location: [place] CV LAB;  Service: Cardiovascular;  Laterality: N/A;  . FOOT SURGERY     bilateral   . HERNIA REPAIR     bilateral   . KNEE SURGERY     right knee   . PROSTATE SURGERY  09/2009   prostate implant  . SPINE SURGERY       Social  History   Tobacco Use  . Smoking status: Current Every Day Smoker    Packs/day: 0.25    Years: 56.00    Pack years: 14.00    Types: Cigarettes  . Smokeless tobacco: Never Used  . Tobacco comment: back to smoking X1 year  Vaping Use  . Vaping Use: Never used  Substance Use Topics  . Alcohol use: No    Alcohol/week: 0.0 standard drinks  . Drug use: No     Family History  Problem Relation Age of Onset  . Heart attack Mother   . Diabetes Mother   . Heart disease Father   . Lung cancer Daughter     Allergies  Allergen Reactions  . Lyrica [Pregabalin]     Swelling   . Prednisone     Feels like having a heart attack. Has had the injection form before and does well.     REVIEW OF SYSTEMS (Negative unless checked) Constitutional: [] ?Weight loss[] ?Fever[] ?Chills Cardiac:[] ?Chest pain[] ? Atrial Fibrillation[] ?Palpitations [] ?Shortness of breath when laying flat [] ?Shortness of breath with exertion. [] ?Shortness of breath at rest Vascular: [] ?Pain in legs with walking[] ?Pain in legswith standing[] ?Pain in legs when laying flat [] ?Claudication  [] ?Pain in feet when laying flat [] ?History of DVT [] ?Phlebitis [] ?Swelling in legs [] ?Varicose veins [] ?Non-healing ulcers Pulmonary: [] ?Uses home oxygen [] ?Productive cough[] ?Hemoptysis [] ?Wheeze [x] ?COPD [] ?Asthma Neurologic: [] ?Dizziness[] ?Seizures [] ?Blackouts[x] ?History of stroke [] ?History of TIA[] ?Aphasia [] ?Temporary Blindness[] ?Weaknessor numbness in arm [] ?Weakness or numbnessin leg Musculoskeletal:[] ?Joint swelling [x] ?Joint pain [] ?Low back pain  [] ? History of Knee Replacement [x] ?Arthritis [] ?back Surgeries[] ? Spinal Stenosis  Hematologic:[] ?Easy bruising[] ?Easy bleeding [] ?Hypercoagulable state [] ?Anemic Gastrointestinal:[] ?Diarrhea [] ?Vomiting[x] ?Gastroesophageal reflux/heartburn[] ?Difficulty swallowing. [] ?Abdominal  pain Genitourinary: [] ?Chronic kidney disease [] ?Difficulturination [] ?Anuric[] ?Blood in urine [] ?Frequenturination [] ?Burning with urination[] ?Hematuria Skin: [] ?Rashes [] ?Ulcers [] ?Wounds Psychological: [] ?History of anxiety[] ?History of major depression  [] ? Memory Difficulties  Physical Examination  BP (!) 172/83 (BP Location: Right Arm)   Pulse (!) 56   Resp 16   Wt 195 lb 9.6 oz (88.7 kg)   BMI 27.28 kg/m  Gen:  WD/WN, NAD Head: [place]/AT, No temporalis wasting. Ear/Nose/Throat: Hearing grossly intact, nares w/o erythema or drainage Eyes: Conjunctiva clear. Sclera non-icteric Neck: Supple.  Trachea midline Pulmonary:  Good air movement, no use of accessory muscles.  Cardiac: RRR, no JVD Vascular:  Vessel Right Left  Radial Palpable Palpable                          PT Palpable Palpable  DP Palpable Palpable   Gastrointestinal: soft, non-tender/non-distended. No guarding/reflex.  Musculoskeletal: M/S 5/5 throughout.  No deformity or atrophy. Walks with a cane. Trace LE edema. Neurologic: Sensation grossly intact in extremities.  Symmetrical.  Speech is fluent.  Psychiatric: Judgment intact, Mood & affect appropriate for pt's clinical situation. Dermatologic: No rashes or ulcers noted.  No cellulitis or open wounds.       Labs Recent Results (from the past 2160 hour(s))  Surgical pathology     Status: None   Collection Time: 08/21/19 11:45 AM  Result Value Ref Range   SURGICAL PATHOLOGY      Surgical Pathology CASE: [id number] PATIENT: Josph Macho Surgical Pathology Report     Specimen Submitted: A. Cyst, mid back  Clinical History: Back cyst     DIAGNOSIS: A. SKIN AND SOFT TISSUE, MID BACK; EXCISION: - EPIDERMAL CYST (FOLLICULAR CYST, INFUNDIBULAR TYPE).  GROSS DESCRIPTION: A. Labeled: Mid back cyst Received: In formalin Tissue fragment(s): 1 Size: 2.3 x 1.5 x 1.3 cm Description: An ellipse of pale-tan skin with  attached underlying tan cystic soft tissue which is disrupted, and expels a moderate amount of white soft material. Representative sections submitted in 1 cassette.   Final Diagnosis performed by Bryan Lemma, MD.   Electronically signed 08/26/2019 1:15:25PM The electronic signature indicates that the named Attending Pathologist has evaluated the specimen Technical component performed at [facility], [address] [place], [place] [address] Lab: [phone] Dir: Rush Farmer, MD, MMM  Professional component performed at [facility],  [facility], [place], [place], [place] [address] Lab: [phone] Dir: Dellia Nims. Reuel Derby, MD     Radiology No results found.  Assessment/Plan  Essential hypertension blood pressure control important in reducing the progression of atherosclerotic disease and aneurysmal growth. On appropriate oral medications.   Hyperlipidemia lipid control important in reducing the progression of atherosclerotic disease. Continue statin therapy   AAA (abdominal aortic aneurysm) without rupture (HCC) His duplex today shows a patent stent graft without evidence of endoleak and a stable aneurysm sac size of approximately 4.2 cm in maximal diameter.  Doing well.  Two years status post repair.  Follow annually at this point.    Leotis Pain, MD  10/14/2019 11:31 AM    This note was created with Dragon medical transcription system.  Any errors from dictation are purely unintentional

## 2019-10-14 NOTE — Assessment & Plan Note (Signed)
His duplex today shows a patent stent graft without evidence of endoleak and a stable aneurysm sac size of approximately 4.2 cm in maximal diameter.  Doing well.  Two years status post repair.  Follow annually at this point.

## 2019-10-14 NOTE — Assessment & Plan Note (Signed)
lipid control important in reducing the progression of atherosclerotic disease. Continue statin therapy  

## 2020-01-12 DIAGNOSIS — Z23 Encounter for immunization: Secondary | ICD-10-CM | POA: Diagnosis not present

## 2020-02-16 DIAGNOSIS — R0600 Dyspnea, unspecified: Secondary | ICD-10-CM | POA: Diagnosis not present

## 2020-02-16 DIAGNOSIS — Z125 Encounter for screening for malignant neoplasm of prostate: Secondary | ICD-10-CM | POA: Diagnosis not present

## 2020-02-16 DIAGNOSIS — E782 Mixed hyperlipidemia: Secondary | ICD-10-CM | POA: Diagnosis not present

## 2020-02-16 DIAGNOSIS — I420 Dilated cardiomyopathy: Secondary | ICD-10-CM | POA: Diagnosis not present

## 2020-02-16 DIAGNOSIS — Z0000 Encounter for general adult medical examination without abnormal findings: Secondary | ICD-10-CM | POA: Diagnosis not present

## 2020-02-16 DIAGNOSIS — R634 Abnormal weight loss: Secondary | ICD-10-CM | POA: Diagnosis not present

## 2020-02-26 ENCOUNTER — Other Ambulatory Visit: Payer: Self-pay

## 2020-02-26 MED ORDER — ENTRESTO 97-103 MG PO TABS: 1.0000 | ORAL_TABLET | Freq: Two times a day (BID) | ORAL | 3 refills | Status: DC

## 2020-03-05 ENCOUNTER — Other Ambulatory Visit: Payer: PPO

## 2020-03-05 DIAGNOSIS — Z20822 Contact with and (suspected) exposure to covid-19: Secondary | ICD-10-CM | POA: Diagnosis not present

## 2020-03-06 LAB — SARS-COV-2, NAA 2 DAY TAT

## 2020-03-06 LAB — NOVEL CORONAVIRUS, NAA: SARS-CoV-2, NAA: NOT DETECTED

## 2020-03-08 ENCOUNTER — Ambulatory Visit: Payer: PPO | Admitting: Cardiovascular Disease

## 2020-03-25 ENCOUNTER — Telehealth: Payer: Self-pay | Admitting: Cardiovascular Disease

## 2020-03-25 NOTE — Progress Notes (Signed)
Cardiology Office Note:    Date:  03/26/2020   ID:  [name], DOB 05-04-1945, MRN [id number]  PCP:  Rusty Aus, MD  [facility] Cardiologist:  No primary care provider on file.  [facility] Electrophysiologist:  None   Referring MD: Crecencio Mc, MD   Chief Complaint: 6 month follow-up  History of Present Illness:    [name] is a 74 y.o. male with a hx of CAD posterior MI in 2002 treated with BMS to Cx artery and BMS to prox LAD in 2012, AAA s/p endograft placement, COPD with ongoing tobacco use, ischemic/dialted cardiomyopathy, chronic knee pain who presents for 6 month follow up. The patient had a Myoview in 12/2017 that showed no large region of ischemia, suspected old infarct of the heart with a very low EF. Echo was ordered to evaluate EF. This showed decreased EF 30-35%, no WMA, G1DD, mildly dilated aortic root 94mm, mild MR, mildly dilated left atrium. The patient was started on GDT. Patient previously declined cath. Most recent Echo 10/2018 showed low EF 30-35%.   Today, patient says he has been doing well from a cardiac perspective. Recently his daughter with cancer went on hospice 2 weeks ago. His wife also recently went on home dialysis. Has been going through a lot and is the main caregiver for his wife. EKG today with no acute changes today. He denies chest pain or sob. He has COPD and is still smoking. He does not want to quit. No lower leg edema, orthopnea, or PND. No palpitations. Has some lightheadedness when he stands up too fast but this is very rare. Appetite is a little low in light of recent stressfull events. BP today 130/76, which is normal for him. Discussed low EF and that he declined cath in the past and he again reiterates the same thing. He is compliant with his medications.    Past Medical History:  Diagnosis Date  . AAA (abdominal aortic aneurysm) ([place])    a.  CTA 7/19: measured 4.5 x 5.3 cm and greatest transverse dimensions  . Abnormal  nuclear stress test    a.  Myoview 2012: anterior wall ischemia with an estimated EF of 26%.  This was a new wall motion abnormality as well as a newly reduced EF  . COPD (chronic obstructive pulmonary disease) ([place])   . Coronary artery disease    a.  Posterior MI in 2002 status post BMS to the LCx; b. [place] 2012: 95% stenosis of the proximal LAD, 95% stenosis of the mid LAD, 30% in-stent restenosis of the mid left circumflex with a second lesion of diffuse 50% stenosis.  The patient underwent successful PCI/BMS to the mid LAD with 0% residual stenosis, LV gram not performed  . GERD (gastroesophageal reflux disease)   . History of kidney stones   . History of prostate cancer   . Hyperlipidemia   . Hypertension   . Myocardial infarction ([place])   . Neuromuscular disorder ([place])   . Prostate cancer ([place])    a.  Status post seeding  . PVD (peripheral vascular disease) ([place])   . Stroke [facility])     Past Surgical History:  Procedure Laterality Date  . BACK SURGERY  2009/2010   x 2  . CARDIAC CATHETERIZATION  2002   stent placement [place]  . ENDOVASCULAR REPAIR/STENT GRAFT N/A 01/02/2018   Procedure: ENDOVASCULAR REPAIR/STENT GRAFT;  Surgeon: Algernon Huxley, MD;  Location: [place] [facility];  Service: Cardiovascular;  Laterality: N/A;  . FOOT SURGERY     bilateral   . HERNIA REPAIR     bilateral   . KNEE SURGERY     right knee   . PROSTATE SURGERY  09/2009   prostate implant  . SPINE SURGERY      Current Medications: Current Meds  Medication Sig  . albuterol (PROVENTIL HFA;VENTOLIN HFA) 108 (90 Base) MCG/ACT inhaler Inhale 2 puffs into the lungs every 6 (six) hours as needed for wheezing.  [name] aspirin 81 MG tablet Take 81 mg by mouth daily.  [name] atorvastatin (LIPITOR) 80 MG tablet TAKE 1 TABLET BY MOUTH DAILY  . budesonide-formoterol (SYMBICORT) 160-4.5 MCG/ACT inhaler Inhale 2 puffs into the lungs 2 (two) times daily.  . carvedilol (COREG) 3.125 MG tablet TAKE 1 TABLET BY MOUTH TWICE A  DAY  . Cholecalciferol (VITAMIN D3) 2000 units capsule Takes on occassion  . methocarbamol (ROBAXIN) 500 MG tablet Take 500 mg by mouth 2 (two) times daily as needed for muscle spasms.  . montelukast (SINGULAIR) 10 MG tablet Take 10 mg by mouth daily.  . Multiple Vitamins-Minerals (MULTIVITAMIN WITH MINERALS) tablet Takes occassionally  . omeprazole (PRILOSEC) 40 MG capsule TAKE 1 CAPSULE BY MOUTH DAILY USUALLY 30MINUTES BEFORE BREAKFAST  . polyethylene glycol powder (GLYCOLAX/MIRALAX) powder Take 17 g by mouth as needed.   . sacubitril-valsartan (ENTRESTO) 97-103 MG Take 1 tablet by mouth 2 (two) times daily.  . traMADol (ULTRAM) 50 MG tablet Take 1 tablet (50 mg total) by mouth every 12 (twelve) hours as needed for moderate pain or severe pain.  . traZODone (DESYREL) 50 MG tablet Take 50 mg by mouth at bedtime.     Allergies:   Lyrica [pregabalin] and Prednisone   Social History   Socioeconomic History  . Marital status: Married    Spouse name: Not on file  . Number of children: Not on file  . Years of education: Not on file  . Highest education level: Not on file  Occupational History  . Not on file  Tobacco Use  . Smoking status: Current Every Day Smoker    Packs/day: 0.25    Years: 56.00    Pack years: 14.00    Types: Cigarettes  . Smokeless tobacco: Never Used  . Tobacco comment: back to smoking X1 year  Vaping Use  . Vaping Use: Never used  Substance and Sexual Activity  . Alcohol use: No    Alcohol/week: 0.0 standard drinks  . Drug use: No  . Sexual activity: Not on file  Other Topics Concern  . Not on file  Social History Narrative  . Not on file   Social Determinants of Health   Financial Resource Strain: Not on file  Food Insecurity: Not on file  Transportation Needs: Not on file  Physical Activity: Not on file  Stress: Not on file  Social Connections: Not on file     Family History: The patient's family history includes Diabetes in his mother; Heart  attack in his mother; Heart disease in his father; Lung cancer in his daughter.  ROS:   Please see the history of present illness.     All other systems reviewed and are negative.  EKGs/Labs/Other Studies Reviewed:    The following studies were reviewed today:   Echocardiogram September 2019 Left ventricle: The cavity size was moderately dilated. There was mild concentric hypertrophy. Systolic function was moderately to severely reduced. The estimated ejection fraction was in the range of 30% to 35%. Wall motion  was normal; there were no regional wall motion abnormalities. Doppler parameters are consistent with abnormal left ventricular relaxation (grade 1 diastolic dysfunction). - Aorta: Aortic root dimension: 39 mm (ED). - Mitral valve: There was mild regurgitation. - Left atrium: The atrium was mildly dilated. - Pulmonary arteries: Systolic pressure was within the normal range.  Stress test December 21, 2017  T wave inversion was noted during stress in the V5 and V6 leads.  There was no ST segment deviation noted during stress.  Defect 1: There is a small defect of moderate severity present in the basal inferior and apex location.  Findings consistent with prior myocardial infarction.  This is a high risk study.  Nuclear stress EF: 16%.  Echo 10/2018 1. Severe hypokinesis of the left ventricular, mid-apical anteroseptal  wall, anterior wall, inferoseptal wall and apical segment.  2. The left ventricle has moderate-severely reduced systolic function,  with an ejection fraction of 30-35%. The cavity size was moderately  dilated. There is mild concentric left ventricular hypertrophy. Left  ventricular diastolic Doppler parameters are  consistent with impaired relaxation.  3. The right ventricle has normal systolic function. The cavity was  normal. There is no increase in right ventricular wall thickness.  4. Left atrial size was mildly dilated.   5. Right atrial size was mildly dilated.  6. The mitral valve is grossly normal.  7. The tricuspid valve is grossly normal.  8. The aortic valve is grossly normal. Aortic valve regurgitation is  trivial by color flow Doppler. No stenosis of the aortic valve.    EKG:  EKG is ordered today.  The ekg ordered today demonstrates NSR, 65bpm, LVH with repol abnormality, PVC, no acute changes  Recent Labs: No results found for requested labs within last 8760 hours.  Recent Lipid Panel    Component Value Date/Time   CHOL 110 04/22/2018 0801   CHOL 154 03/04/2013 0831   TRIG 53 04/22/2018 0801   HDL 37 (L) 04/22/2018 0801   HDL 39 (L) 03/04/2013 0831   CHOLHDL 3.0 04/22/2018 0801   VLDL 11 04/22/2018 0801   LDLCALC 62 04/22/2018 0801   LDLCALC 88 03/04/2013 0831     Risk Assessment/Calculations:       Physical Exam:    VS:  BP 130/76 (BP Location: Left Arm, Patient Position: Sitting, Cuff Size: Normal)   Pulse 65   Ht 5\' 10"  (1.778 m)   Wt 185 lb (83.9 kg)   SpO2 92%   BMI 26.54 kg/m     Wt Readings from Last 3 Encounters:  03/26/20 185 lb (83.9 kg)  10/14/19 195 lb 9.6 oz (88.7 kg)  09/02/19 195 lb (88.5 kg)     GEN: Well nourished, well developed in no acute distress HEENT: Normal NECK: No JVD; No carotid bruits LYMPHATICS: No lymphadenopathy CARDIAC: RRR, no murmurs, rubs, gallops RESPIRATORY:  Clear to auscultation without rales, wheezing or rhonchi  ABDOMEN: Soft, non-tender, non-distended MUSCULOSKELETAL:  No edema; No deformity  SKIN: Warm and dry NEUROLOGIC:  Alert and oriented x 3 PSYCHIATRIC:  Normal affect   ASSESSMENT:    1. Ischemic cardiomyopathy   2. Atherosclerosis of native coronary artery with stable angina pectoris, unspecified whether native or transplanted heart ([place])   3. Medication management   4. Chronic systolic heart failure ([place])   5. Dilated cardiomyopathy ([place])   6. Hyperlipidemia, mixed   7. Coronary artery disease of native  artery of native heart with stable angina pectoris ([place])    PLAN:  In order of problems listed above:  Ischemic and Dilated Cardiomyopathy/chronic combined systolic and diastolic heart failure Most recent Echo in 10/2018 showed LVEF 30-35 and moderately dilated LV. He is taking Entresto and coreg. BP is good today. Patient is agreeable to start spironolactone. Will start 12.5mg  daily. BMET in 1 week. Will check Echo in 3 months to assess pump function. If EF is still down he will need to see EP for possible ICD.   CAD with remote stenting Myoview in 12/2017 showed low EF with small area of ischemia. Eho showed EF 30-35%. He declined cath and is on GDT. Again he expresses he would not want to undergo any invasive procedures. He denies anginal symptoms. EKG shows NSR with PVC. Continue aspirin, statin, BB.   HTN BP today 130/62. Continue Entresto, coreg. Add spiro as above.   HLD Continue lipitor. LDL 62.  AAA s/p endograph placement Follows with [facility].    Disposition: Follow up MD/APP with after the eho   Shared Decision Making/Informed Consent        Signed, Wlliam Grosso Ninfa Meeker, PA-C  03/26/2020 11:20 AM    [place]

## 2020-03-25 NOTE — Telephone Encounter (Signed)
Patient received a letter from [employer] but is not sure what to do.  Patient advised to bring to visit tomorrow to discuss.

## 2020-03-26 ENCOUNTER — Ambulatory Visit (INDEPENDENT_AMBULATORY_CARE_PROVIDER_SITE_OTHER): Payer: PPO | Admitting: Medical

## 2020-03-26 ENCOUNTER — Encounter: Payer: Self-pay | Admitting: Physician Assistant

## 2020-03-26 ENCOUNTER — Other Ambulatory Visit: Payer: Self-pay

## 2020-03-26 VITALS — BP 130/76 | HR 65 | Ht 70.0 in | Wt 185.0 lb

## 2020-03-26 DIAGNOSIS — I420 Dilated cardiomyopathy: Secondary | ICD-10-CM | POA: Diagnosis not present

## 2020-03-26 DIAGNOSIS — E782 Mixed hyperlipidemia: Secondary | ICD-10-CM | POA: Diagnosis not present

## 2020-03-26 DIAGNOSIS — I5022 Chronic systolic (congestive) heart failure: Secondary | ICD-10-CM | POA: Diagnosis not present

## 2020-03-26 DIAGNOSIS — I25118 Atherosclerotic heart disease of native coronary artery with other forms of angina pectoris: Secondary | ICD-10-CM

## 2020-03-26 DIAGNOSIS — I255 Ischemic cardiomyopathy: Secondary | ICD-10-CM | POA: Diagnosis not present

## 2020-03-26 DIAGNOSIS — Z79899 Other long term (current) drug therapy: Secondary | ICD-10-CM | POA: Diagnosis not present

## 2020-03-26 MED ORDER — SPIRONOLACTONE 25 MG PO TABS: 12.5000 mg | ORAL_TABLET | Freq: Every day | ORAL | 1 refills | Status: DC

## 2020-03-26 NOTE — Patient Instructions (Signed)
Medication Instructions:   Your physician has recommended you make the following change in your medication:   START Spironolactone 12.5mg  once daily - An Rx was sent to your pharmacy for Spironolactone 25mg  - Take 1/2 tablet for (12.5mg ) once daily  *If you need a refill on your cardiac medications before your next appointment, please call your pharmacy*   Lab Work:  -  Your physician recommends that you return for lab work in: [place] for Bmet -  Please go to the [employer]. You will check in at the front desk to the right as you walk into the atrium. Valet Parking is offered if needed. - No appointment needed. You may go any day between 7 am and 6 pm.   If you have labs (blood work) drawn today and your tests are completely normal, you will receive your results only by: [name] MyChart Message (if you have MyChart) OR . A paper copy in the mail If you have any lab test that is abnormal or we need to change your treatment, we will call you to review the results.   Testing/Procedures:  Your physician has requested that you have an echocardiogram to be scheduled in 3 MONTHS. Echocardiography is a painless test that uses sound waves to create images of your heart. It provides your doctor with information about the size and shape of your heart and how well your heart's chambers and valves are working. This procedure takes approximately one hour. There are no restrictions for this procedure.    Follow-Up: At [facility], you and your health needs are our priority.  As part of our continuing mission to provide you with exceptional heart care, we have created designated Provider Care Teams.  These Care Teams include your primary Cardiologist (physician) and Advanced Practice Providers (APPs -  Physician Assistants and Nurse Practitioners) who all work together to provide you with the care you need, when you need it.  We recommend signing up for the patient portal called "MyChart".  Sign  up information is provided on this After Visit Summary.  MyChart is used to connect with patients for Virtual Visits (Telemedicine).  Patients are able to view lab/test results, encounter notes, upcoming appointments, etc.  Non-urgent messages can be sent to your provider as well.   To learn more about what you can do with MyChart, go to [email or web address].    Your next appointment:   3 month(s) - AFTER Echo  The format for your next appointment:   In Person  Provider:   You may see Dr. Rockey Situ or one of the following Advanced Practice Providers on your designated Care Team:    Murray Hodgkins, NP  Christell Faith, PA-C  Marrianne Mood, PA-C  Cadence [place], [place]  Laurann [place], NP

## 2020-03-29 ENCOUNTER — Other Ambulatory Visit
Admission: RE | Admit: 2020-03-29 | Discharge: 2020-03-29 | Disposition: A | Discharge status: Home / Self Care | Payer: PPO | Attending: Medical | Admitting: Medical

## 2020-03-29 ENCOUNTER — Telehealth: Payer: Self-pay

## 2020-03-29 ENCOUNTER — Other Ambulatory Visit: Payer: Self-pay

## 2020-03-29 DIAGNOSIS — I255 Ischemic cardiomyopathy: Secondary | ICD-10-CM

## 2020-03-29 DIAGNOSIS — Z79899 Other long term (current) drug therapy: Secondary | ICD-10-CM | POA: Insufficient documentation

## 2020-03-29 DIAGNOSIS — I25118 Atherosclerotic heart disease of native coronary artery with other forms of angina pectoris: Secondary | ICD-10-CM

## 2020-03-29 LAB — BASIC METABOLIC PANEL
Anion gap: 10 (ref 5–15)
BUN: 13 mg/dL (ref 8–23)
CO2: 28 mmol/L (ref 22–32)
Calcium: 8.4 mg/dL — ABNORMAL LOW (ref 8.9–10.3)
Chloride: 102 mmol/L (ref 98–111)
Creatinine, Ser: 0.97 mg/dL (ref 0.61–1.24)
GFR, Estimated: >60 mL/min (ref >60)
Glucose, Bld: 128 mg/dL — ABNORMAL HIGH (ref 70–99)
Potassium: 3.4 mmol/L — ABNORMAL LOW (ref 3.5–5.1)
Sodium: 140 mmol/L (ref 135–145)

## 2020-03-29 NOTE — Telephone Encounter (Signed)
Called and spoke with pt's wife regarding pt's application for [facility] for patient assistance. Wife request we fax the completed form. Dr. Rockey Situ has signed.

## 2020-03-29 NOTE — Telephone Encounter (Signed)
Patient dropped off [employer] application to be completed  Placed in Advertising copywriter

## 2020-03-29 NOTE — Progress Notes (Signed)
[employer] Patient [place]. form completed for assistance with Delene Loll,  form was faxed to [phone] on 03/29/2020

## 2020-03-30 ENCOUNTER — Telehealth: Payer: Self-pay | Admitting: *Deleted

## 2020-03-30 DIAGNOSIS — Z79899 Other long term (current) drug therapy: Secondary | ICD-10-CM

## 2020-03-30 DIAGNOSIS — I5022 Chronic systolic (congestive) heart failure: Secondary | ICD-10-CM

## 2020-03-30 NOTE — Telephone Encounter (Signed)
-----   Message from [place], PA-C sent at 03/29/2020  3:53 PM EST ----- Labs showed good kidney function. Potassium is a little low. Since we just started spironolactone will wait for follow-up BMET to evaluate potassium.

## 2020-03-30 NOTE — Telephone Encounter (Signed)
Spoke to pt. Notified that we will repeat Bmet in 1 week at the medical mall at [facility].  Pt verbalized understanding. Orders placed. Pt has no further questions at this time.

## 2020-04-06 ENCOUNTER — Other Ambulatory Visit
Admission: RE | Admit: 2020-04-06 | Discharge: 2020-04-06 | Disposition: A | Discharge status: Home / Self Care | Payer: PPO | Attending: Medical | Admitting: Medical

## 2020-04-06 ENCOUNTER — Other Ambulatory Visit: Payer: Self-pay

## 2020-04-06 DIAGNOSIS — I5022 Chronic systolic (congestive) heart failure: Secondary | ICD-10-CM | POA: Insufficient documentation

## 2020-04-06 DIAGNOSIS — Z79899 Other long term (current) drug therapy: Secondary | ICD-10-CM | POA: Insufficient documentation

## 2020-04-06 LAB — BASIC METABOLIC PANEL
Anion gap: 8 (ref 5–15)
BUN: 13 mg/dL (ref 8–23)
CO2: 29 mmol/L (ref 22–32)
Calcium: 8.6 mg/dL — ABNORMAL LOW (ref 8.9–10.3)
Chloride: 103 mmol/L (ref 98–111)
Creatinine, Ser: 0.99 mg/dL (ref 0.61–1.24)
GFR, Estimated: >60 mL/min (ref >60)
Glucose, Bld: 102 mg/dL — ABNORMAL HIGH (ref 70–99)
Potassium: 3.9 mmol/L (ref 3.5–5.1)
Sodium: 140 mmol/L (ref 135–145)

## 2020-04-13 ENCOUNTER — Other Ambulatory Visit: Payer: Self-pay | Admitting: *Deleted

## 2020-04-13 DIAGNOSIS — I5022 Chronic systolic (congestive) heart failure: Secondary | ICD-10-CM

## 2020-04-14 ENCOUNTER — Other Ambulatory Visit
Admission: RE | Admit: 2020-04-14 | Discharge: 2020-04-14 | Disposition: A | Discharge status: Home / Self Care | Payer: PPO | Attending: Medical | Admitting: Medical

## 2020-04-14 ENCOUNTER — Encounter: Payer: Self-pay | Admitting: *Deleted

## 2020-04-14 ENCOUNTER — Other Ambulatory Visit: Payer: Self-pay

## 2020-04-14 ENCOUNTER — Telehealth: Payer: Self-pay | Admitting: *Deleted

## 2020-04-14 DIAGNOSIS — I5022 Chronic systolic (congestive) heart failure: Secondary | ICD-10-CM | POA: Diagnosis not present

## 2020-04-14 LAB — BASIC METABOLIC PANEL
Anion gap: 6 (ref 5–15)
BUN: 13 mg/dL (ref 8–23)
CO2: 31 mmol/L (ref 22–32)
Calcium: 8.7 mg/dL — ABNORMAL LOW (ref 8.9–10.3)
Chloride: 101 mmol/L (ref 98–111)
Creatinine, Ser: 1 mg/dL (ref 0.61–1.24)
GFR, Estimated: >60 mL/min (ref >60)
Glucose, Bld: 96 mg/dL (ref 70–99)
Potassium: 3.6 mmol/L (ref 3.5–5.1)
Sodium: 138 mmol/L (ref 135–145)

## 2020-04-14 NOTE — Telephone Encounter (Signed)
Patient returned call, refuses lung screening scan at this time due to multiple other co morbidities that he is dealing with. He knows that he can contact me anytime in the future if he would like to schedule lung screening.

## 2020-04-14 NOTE — Telephone Encounter (Signed)
Attempted to contact and schedule lung screening scan. Message left for patient to call back to schedule. 

## 2020-05-18 ENCOUNTER — Telehealth: Payer: Self-pay | Admitting: Cardiovascular Disease

## 2020-05-18 NOTE — Telephone Encounter (Signed)
Spoke with [name] regarding his Entresto medication, advised [name] as of 1/25 had spoken with [employer] [facility] representative and they stated [name]'s application was still in progress and mediation was on it's way for delivery. [name] still has not revived medication, advised if not there within the next couple of days to come by clinic for samples for coverage. [name] has enough to last him another 5 days. [name] verbalized understanding, will come by for samples if needed.

## 2020-05-18 NOTE — Telephone Encounter (Signed)
Please call regarding Patient assistance regarding Entresto.

## 2020-05-26 ENCOUNTER — Telehealth: Payer: Self-pay

## 2020-05-26 NOTE — Telephone Encounter (Signed)
Received Fax from [employer], that pt is approved for PA for the remainder of this calender year for [employer]. Patient ID: [id number]

## 2020-06-23 ENCOUNTER — Telehealth: Payer: Self-pay | Admitting: Cardiovascular Disease

## 2020-06-23 NOTE — Telephone Encounter (Signed)
Patient wife would like call after upcoming visit to discuss dx.  Recent hip fracture and is unable to come to assist patient as needed in person.    Please call after Monday visit.

## 2020-06-24 ENCOUNTER — Other Ambulatory Visit: Payer: Self-pay

## 2020-06-24 ENCOUNTER — Ambulatory Visit (INDEPENDENT_AMBULATORY_CARE_PROVIDER_SITE_OTHER): Payer: PPO

## 2020-06-24 DIAGNOSIS — I255 Ischemic cardiomyopathy: Secondary | ICD-10-CM

## 2020-06-24 LAB — ECHOCARDIOGRAM COMPLETE
Area-P 1/2: 3.06 cm2
S' Lateral: 4.7 cm

## 2020-06-24 NOTE — Telephone Encounter (Signed)
Return Vickie's phone call, (DPR approved). Vickie requested that a phone call be made to here after Mrs. Bogle's visit next Monday 3/14 if there is anything critical she needs to know about Mrs. Milone's visit. Wife will not be able to attend this time d/t recent hip fx. Advised Vickie that instructions would be explained on AVS and anything that would needed detail explanation we would give her a call. Wife feels Mr. Fredderick Severance "has a hard time retaining what Dr. Rockey Situ says and he will forget before he gets home". Vickie will get a call Monday if anything changes with Mr. Sheperd's plan of care. Otherwise all questions or concerns were address and no additional concerns at this time.

## 2020-06-27 NOTE — Progress Notes (Unsigned)
Date:  06/28/2020   ID:  [name], DOB [birth year], MRN [id number]  Patient Location:  [phone] [place] [address]   Provider location:   [facility], [place] office  PCP:  Rusty Aus, MD  Cardiologist:  Arvid Right [facility]  Chief Complaint  Patient presents with  . Follow-up    3 month F/U after echo    History of Present Illness:    [name] is a 75 y.o. male  past medical history of CAD, posterior MI in 2002 treated with a bare-metal stent to the circumflex artery,  back surgery by Dr. Patrice Paradise x2 In 2009 and 2010,  Prostate cancer with seed implantation negative Myoview scan 2010 with an ejection fraction of 60% (2010). abnormal EKG noted on office visit in 2012, stress test that showed anterior wall ischemia.  Cardiac catheterization was performed on January 05 2011 that showed 95% proximal LAD disease.  bare metal stent was placed ( 3.5 x 18 mm vision bare metal stent). AAA 3.7 cm In 2013, up to 4.2 ( 4.5 cm May 2018) He presents for follow up of his CAD and AAA  Recent stressors at home Wife fell broke hip, had surgery wife also with peritoneal HD Daughter died from cancer, was in hospice, 75 yo  Reports he is not sleeping well, appetite stable Discussed recent echocardiogram findings with him No change in ejection fraction estimation, is 30 to 35%.   Was told he might need defibrillator, reports this is not a good time as he is taking care of his wife who is currently disabled following hip fracture, on peritoneal dialysis  Denies significant chest pain, has chronic shortness of breath Tolerating Entresto 97/103 BID with spironolactone, carvedilol  Prior CV studies:   The following studies were reviewed today:  Echocardiogram September 2019 Left ventricle: The cavity size was moderately dilated. There was mild concentric hypertrophy. Systolic function was moderately to severely reduced. The estimated ejection  fraction was in the range of 30% to 35%. Wall motion was normal; there were no regional wall motion abnormalities. Doppler parameters are consistent with abnormal left ventricular relaxation (grade 1 diastolic dysfunction). - Aorta: Aortic root dimension: 39 mm (ED). - Mitral valve: There was mild regurgitation. - Left atrium: The atrium was mildly dilated. - Pulmonary arteries: Systolic pressure was within the normal range.  Stress test December 21, 2017  T wave inversion was noted during stress in the V5 and V6 leads.  There was no ST segment deviation noted during stress.  Defect 1: There is a small defect of moderate severity present in the basal inferior and apex location.  Findings consistent with prior myocardial infarction.  This is a high risk study.  Nuclear stress EF: 16%.   Past Medical History:  Diagnosis Date  . AAA (abdominal aortic aneurysm) ([place])    a.  CTA 7/19: measured 4.5 x 5.3 cm and greatest transverse dimensions  . Abnormal nuclear stress test    a.  Myoview 2012: anterior wall ischemia with an estimated EF of 26%.  This was a new wall motion abnormality as well as a newly reduced EF  . COPD (chronic obstructive pulmonary disease) ([place])   . Coronary artery disease    a.  Posterior MI in 2002 status post BMS to the LCx; b. [place] 2012: 95% stenosis of the proximal LAD, 95% stenosis of the mid LAD, 30% in-stent restenosis of the mid left circumflex with a second  lesion of diffuse 50% stenosis.  The patient underwent successful PCI/BMS to the mid LAD with 0% residual stenosis, LV gram not performed  . GERD (gastroesophageal reflux disease)   . History of kidney stones   . History of prostate cancer   . Hyperlipidemia   . Hypertension   . Myocardial infarction ([place])   . Neuromuscular disorder ([place])   . Prostate cancer ([place])    a.  Status post seeding  . PVD (peripheral vascular disease) ([place])   . Stroke [facility])    Past Surgical History:   Procedure Laterality Date  . BACK SURGERY  2009/2010   x 2  . CARDIAC CATHETERIZATION  2002   stent placement [place]  . ENDOVASCULAR REPAIR/STENT GRAFT N/A 01/02/2018   Procedure: ENDOVASCULAR REPAIR/STENT GRAFT;  Surgeon: Algernon Huxley, MD;  Location: [facility];  Service: Cardiovascular;  Laterality: N/A;  . FOOT SURGERY     bilateral   . HERNIA REPAIR     bilateral   . KNEE SURGERY     right knee   . PROSTATE SURGERY  09/2009   prostate implant  . SPINE SURGERY       Current Meds  Medication Sig  . albuterol (PROVENTIL HFA;VENTOLIN HFA) 108 (90 Base) MCG/ACT inhaler Inhale 2 puffs into the lungs every 6 (six) hours as needed for wheezing.  [name] aspirin 81 MG tablet Take 81 mg by mouth daily.  [name] atorvastatin (LIPITOR) 80 MG tablet TAKE 1 TABLET BY MOUTH DAILY  . budesonide-formoterol (SYMBICORT) 160-4.5 MCG/ACT inhaler Inhale 2 puffs into the lungs 2 (two) times daily.  . carvedilol (COREG) 3.125 MG tablet TAKE 1 TABLET BY MOUTH TWICE A DAY  . Cholecalciferol (VITAMIN D3) 2000 units capsule Takes on occassion  . methocarbamol (ROBAXIN) 500 MG tablet Take 500 mg by mouth 2 (two) times daily as needed for muscle spasms.  . montelukast (SINGULAIR) 10 MG tablet Take 10 mg by mouth daily.  . Multiple Vitamins-Minerals (MULTIVITAMIN WITH MINERALS) tablet Takes occassionally  . omeprazole (PRILOSEC) 40 MG capsule TAKE 1 CAPSULE BY MOUTH DAILY USUALLY 30MINUTES BEFORE BREAKFAST  . polyethylene glycol powder (GLYCOLAX/MIRALAX) powder Take 17 g by mouth as needed.   . sacubitril-valsartan (ENTRESTO) 97-103 MG Take 1 tablet by mouth 2 (two) times daily.  [name] spironolactone (ALDACTONE) 25 MG tablet Take 0.5 tablets (12.5 mg total) by mouth daily.  . traMADol (ULTRAM) 50 MG tablet Take 1 tablet (50 mg total) by mouth every 12 (twelve) hours as needed for moderate pain or severe pain.  . traZODone (DESYREL) 50 MG tablet Take 50 mg by mouth at bedtime.     Allergies:   Lyrica  [pregabalin] and Prednisone   Social History   Tobacco Use  . Smoking status: Current Every Day Smoker    Packs/day: 0.25    Years: 56.00    Pack years: 14.00    Types: Cigarettes  . Smokeless tobacco: Never Used  . Tobacco comment: back to smoking X1 year  Vaping Use  . Vaping Use: Never used  Substance Use Topics  . Alcohol use: No    Alcohol/week: 0.0 standard drinks  . Drug use: No     Family Hx: The patient's family history includes Diabetes in his mother; Heart attack in his mother; Heart disease in his father; Lung cancer in his daughter.  ROS:   Please see the history of present illness.    Review of Systems  Constitutional: Negative.   HENT: Negative.  Respiratory: Negative.   Cardiovascular: Negative.   Gastrointestinal: Negative.   Musculoskeletal: Positive for back pain and joint pain.  Neurological: Negative.   Psychiatric/Behavioral: Negative.   All other systems reviewed and are negative.    Labs/Other Tests and Data Reviewed:    Recent Labs: 04/14/2020: BUN 13; Creatinine, Ser 1.00; Potassium 3.6; Sodium 138   Recent Lipid Panel Lab Results  Component Value Date/Time   CHOL 110 04/22/2018 08:01 AM   CHOL 154 03/04/2013 08:31 AM   TRIG 53 04/22/2018 08:01 AM   HDL 37 (L) 04/22/2018 08:01 AM   HDL 39 (L) 03/04/2013 08:31 AM   CHOLHDL 3.0 04/22/2018 08:01 AM   LDLCALC 62 04/22/2018 08:01 AM   LDLCALC 88 03/04/2013 08:31 AM    Wt Readings from Last 3 Encounters:  06/28/20 175 lb (79.4 kg)  03/26/20 185 lb (83.9 kg)  10/14/19 195 lb 9.6 oz (88.7 kg)     Exam:    BP 140/80 (BP Location: Left Arm, Patient Position: Sitting, Cuff Size: Large)   Pulse 64   Ht 5\' 11"  (1.803 m)   Wt 175 lb (79.4 kg)   SpO2 96%   BMI 24.41 kg/m   Constitutional:  oriented to person, place, and time. No distress.  HENT:  Head: Grossly normal Eyes:  no discharge. No scleral icterus.  Neck: No JVD, no carotid bruits  Cardiovascular: Regular rate and  rhythm, no murmurs appreciated Pulmonary/Chest: Clear to auscultation bilaterally, no wheezes or rails Abdominal: Soft.  no distension.  no tenderness.  Musculoskeletal: Normal range of motion Neurological:  normal muscle tone. Coordination normal. No atrophy Skin: Skin warm and dry Psychiatric: normal affect, pleasant   ASSESSMENT & PLAN:    Dilated cardiomyopathy Continue Entresto, Coreg, spironolactone No changes to his medications He is declining evaluation for defibrillator at this time as detailed above  Atherosclerosis of native coronary artery with stable angina pectoris, unspecified whether native or transplanted heart (HCC)  stress test with only small region of ischemia Echocardiogram ejection fraction 30 to 35%, no change on repeat echo Previously declined cardiac catheterization Continue Entresto and carvedilol, spironolactone As above declining defibrillator  Centrilobular emphysema (HCC) -  Still smoking, one half pack per day Smoking cessation recommended  Tobacco abuse counseling - We have encouraged him to continue to work on weaning his cigarettes and smoking cessation. He will continue to work on this and does not want any assistance with chantix.   COPD exacerbation (HCC) -  Uses inhalers,  Breathing stable, no exacerbations  AAA endograft placement Stressed importance of smoking cessation   Total encounter time more than 25 minutes  Greater than 50% was spent in counseling and coordination of care with the patient    Signed, Ida Rogue, MD  06/28/2020 3:30 PM    [place] Office [address] [place] [address], [place], [place] [address]

## 2020-06-28 ENCOUNTER — Other Ambulatory Visit: Payer: Self-pay

## 2020-06-28 ENCOUNTER — Ambulatory Visit: Payer: PPO | Admitting: Cardiovascular Disease

## 2020-06-28 ENCOUNTER — Encounter: Payer: Self-pay | Admitting: Cardiovascular Disease

## 2020-06-28 VITALS — BP 140/80 | HR 64 | Ht 71.0 in | Wt 175.0 lb

## 2020-06-28 DIAGNOSIS — I714 Abdominal aortic aneurysm, without rupture, unspecified: Secondary | ICD-10-CM

## 2020-06-28 DIAGNOSIS — I420 Dilated cardiomyopathy: Secondary | ICD-10-CM

## 2020-06-28 DIAGNOSIS — J432 Centrilobular emphysema: Secondary | ICD-10-CM | POA: Diagnosis not present

## 2020-06-28 DIAGNOSIS — E782 Mixed hyperlipidemia: Secondary | ICD-10-CM

## 2020-06-28 DIAGNOSIS — I10 Essential (primary) hypertension: Secondary | ICD-10-CM

## 2020-06-28 DIAGNOSIS — I25118 Atherosclerotic heart disease of native coronary artery with other forms of angina pectoris: Secondary | ICD-10-CM | POA: Diagnosis not present

## 2020-06-28 DIAGNOSIS — I255 Ischemic cardiomyopathy: Secondary | ICD-10-CM | POA: Diagnosis not present

## 2020-06-28 DIAGNOSIS — I5022 Chronic systolic (congestive) heart failure: Secondary | ICD-10-CM

## 2020-06-28 DIAGNOSIS — Z720 Tobacco use: Secondary | ICD-10-CM | POA: Diagnosis not present

## 2020-06-28 NOTE — Patient Instructions (Signed)
Medication Instructions:  No changes  If you need a refill on your cardiac medications before your next appointment, please call your pharmacy.    Lab work: No new labs needed   If you have labs (blood work) drawn today and your tests are completely normal, you will receive your results only by: . MyChart Message (if you have MyChart) OR . A paper copy in the mail If you have any lab test that is abnormal or we need to change your treatment, we will call you to review the results.   Testing/Procedures: No new testing needed   Follow-Up: At [facility], you and your health needs are our priority.  As part of our continuing mission to provide you with exceptional heart care, we have created designated Provider Care Teams.  These Care Teams include your primary Cardiologist (physician) and Advanced Practice Providers (APPs -  Physician Assistants and Nurse Practitioners) who all work together to provide you with the care you need, when you need it.  . You will need a follow up appointment in 6 months  . Providers on your designated Care Team:   . Christopher Berge, NP . Ryan Dunn, PA-C . Jacquelyn Visser, PA-C  Any Other Special Instructions Will Be Listed Below (If Applicable).  COVID-19 Vaccine Information can be found at: [email or web address] For questions related to vaccine distribution or appointments, please email [email or web address] or call [phone].     

## 2020-08-16 DIAGNOSIS — R5382 Chronic fatigue, unspecified: Secondary | ICD-10-CM | POA: Diagnosis not present

## 2020-08-16 DIAGNOSIS — Z79899 Other long term (current) drug therapy: Secondary | ICD-10-CM | POA: Diagnosis not present

## 2020-08-16 DIAGNOSIS — I420 Dilated cardiomyopathy: Secondary | ICD-10-CM | POA: Diagnosis not present

## 2020-08-16 DIAGNOSIS — Z125 Encounter for screening for malignant neoplasm of prostate: Secondary | ICD-10-CM | POA: Diagnosis not present

## 2020-08-16 DIAGNOSIS — Z0000 Encounter for general adult medical examination without abnormal findings: Secondary | ICD-10-CM | POA: Diagnosis not present

## 2020-08-16 DIAGNOSIS — J431 Panlobular emphysema: Secondary | ICD-10-CM | POA: Diagnosis not present

## 2020-10-01 ENCOUNTER — Ambulatory Visit (INDEPENDENT_AMBULATORY_CARE_PROVIDER_SITE_OTHER): Payer: PPO | Admitting: Vascular Surgery

## 2020-10-01 ENCOUNTER — Other Ambulatory Visit (INDEPENDENT_AMBULATORY_CARE_PROVIDER_SITE_OTHER): Payer: PPO

## 2020-12-28 ENCOUNTER — Ambulatory Visit: Payer: PPO | Admitting: Cardiovascular Disease

## 2021-01-19 ENCOUNTER — Other Ambulatory Visit: Payer: Self-pay

## 2021-01-19 ENCOUNTER — Encounter: Payer: Self-pay | Admitting: Cardiovascular Disease

## 2021-01-19 ENCOUNTER — Ambulatory Visit: Payer: PPO | Admitting: Cardiovascular Disease

## 2021-01-19 VITALS — BP 158/80 | HR 57 | Ht 72.0 in | Wt 167.4 lb

## 2021-01-19 DIAGNOSIS — I10 Essential (primary) hypertension: Secondary | ICD-10-CM | POA: Diagnosis not present

## 2021-01-19 MED ORDER — SPIRONOLACTONE 25 MG PO TABS: 12.5000 mg | ORAL_TABLET | Freq: Every day | ORAL | 3 refills | Status: DC

## 2021-01-19 MED ORDER — ENTRESTO 97-103 MG PO TABS: 1.0000 | ORAL_TABLET | Freq: Two times a day (BID) | ORAL | 3 refills | Status: DC

## 2021-01-19 MED ORDER — CARVEDILOL 3.125 MG PO TABS: 3.1250 mg | ORAL_TABLET | Freq: Two times a day (BID) | ORAL | 3 refills | Status: DC

## 2021-01-19 MED ORDER — ATORVASTATIN CALCIUM 80 MG PO TABS: 80.0000 mg | ORAL_TABLET | Freq: Every day | ORAL | 3 refills | Status: DC

## 2021-01-19 NOTE — Patient Instructions (Addendum)
Medication Instructions:  No changes  If you need a refill on your cardiac medications before your next appointment, please call your pharmacy.   Lab work: No new labs needed  Testing/Procedures: No new testing needed   Follow-Up: At [facility], you and your health needs are our priority.  As part of our continuing mission to provide you with exceptional heart care, we have created designated Provider Care Teams.  These Care Teams include your primary Cardiologist (physician) and Advanced Practice Providers (APPs -  Physician Assistants and Nurse Practitioners) who all work together to provide you with the care you need, when you need it.  You will need a follow up appointment in 6 months  Providers on your designated Care Team:   Christopher Berge, NP Ryan Dunn, PA-C Jacquelyn Visser, PA-C Cadence Furth, PA-C   COVID-19 Vaccine Information can be found at: [email or web address] For questions related to vaccine distribution or appointments, please email [email or web address] or call [phone].    

## 2021-01-19 NOTE — Progress Notes (Addendum)
Date:  01/19/2021   ID:  [name], DOB 03-08-1946, MRN [id number]  Patient Location:  [phone] [place] [id number]   Provider location:   [facility], [place] office  PCP:  Rusty Aus, MD  Cardiologist:  Arvid Right [facility]  Chief Complaint  Patient presents with   Other    6 month f/u no complaints today. Meds reviewed verbally with pt.    History of Present Illness:    [name] is a 75 y.o. male  past medical history of CAD,  posterior MI in 2002 treated with a bare-metal stent to the circumflex artery,   back surgery by Dr. Patrice Paradise x2 In 2009 and 2010,  Prostate cancer with seed implantation negative Myoview scan 2010 with an ejection fraction of 60% (2010). abnormal EKG noted on office visit in 2012,  stress test that showed anterior wall ischemia.  Cardiac catheterization was performed on January 05 2011 that showed 95% proximal LAD disease.  bare metal stent was placed ( 3.5 x 18 mm vision bare metal stent). AAA 3.7 cm In 2013, up to 4.2 ( 4.5 cm May 2018)  He presents for follow up of his CAD and AAA  Last clinic visit June 28, 2020 At that time, stressors, [name] fell broke hip, had surgery [name] also on peritoneal HD [name] died from cancer, was in hospice, 75 yo  In follow-up he reports things have continued to be stressful Continues to take care of his [name] who presents with him today, She is on peritoneal dialysis, had a fall  He has a thick nonproductive cough, reports he started smoking again Only clear sputum coming out Not using his nebulizer Walking with a cane  Tolerating Entresto, spironolactone, carvedilol Not particularly interested in more medications Declined defibrillator on prior clinic visit Denies chest pain  echocardiogram June 24, 2020 No change in ejection fraction estimation, is 30 to 35%.  Unchanged from July 2020  EKG personally reviewed by myself on todays visit Sinus bradycardia rate  57 bpm ST and T wave abnormality V4 through V6, 1 and aVL, consider old inferior MI   Prior CV studies:   The following studies were reviewed today:  Echocardiogram September 2019 Left ventricle: The cavity size was moderately dilated. There was   mild concentric hypertrophy. Systolic function was moderately to   severely reduced. The estimated ejection fraction was in the   range of 30% to 35%. Wall motion was normal; there were no   regional wall motion abnormalities. Doppler parameters are   consistent with abnormal left ventricular relaxation (grade 1   diastolic dysfunction). - Aorta: Aortic root dimension: 39 mm (ED). - Mitral valve: There was mild regurgitation. - Left atrium: The atrium was mildly dilated. - Pulmonary arteries: Systolic pressure was within the normal   range.   Stress test December 21, 2017 T wave inversion was noted during stress in the V5 and V6 leads. There was no ST segment deviation noted during stress. Defect 1: There is a small defect of moderate severity present in the basal inferior and apex location. Findings consistent with prior myocardial infarction. This is a high risk study. Nuclear stress EF: 16%.   Past Medical History:  Diagnosis Date   AAA (abdominal aortic aneurysm)    a.  CTA 7/19: measured 4.5 x 5.3 cm and greatest transverse dimensions   Abnormal nuclear stress test    a.  Myoview 2012: anterior wall ischemia with  an estimated EF of 26%.  This was a new wall motion abnormality as well as a newly reduced EF   COPD (chronic obstructive pulmonary disease) (HCC)    Coronary artery disease    a.  Posterior MI in 2002 status post BMS to the LCx; b. [place] 2012: 95% stenosis of the proximal LAD, 95% stenosis of the mid LAD, 30% in-stent restenosis of the mid left circumflex with a second lesion of diffuse 50% stenosis.  The patient underwent successful PCI/BMS to the mid LAD with 0% residual stenosis, LV gram not performed   GERD  (gastroesophageal reflux disease)    History of kidney stones    History of prostate cancer    Hyperlipidemia    Hypertension    Myocardial infarction [facility])    Neuromuscular disorder ([place])    Prostate cancer ([place])    a.  Status post seeding   PVD (peripheral vascular disease) ([place])    Stroke [facility])    Past Surgical History:  Procedure Laterality Date   BACK SURGERY  2009/2010   x 2   CARDIAC CATHETERIZATION  2002   stent placement [place]   ENDOVASCULAR REPAIR/STENT GRAFT N/A 01/02/2018   Procedure: ENDOVASCULAR REPAIR/STENT GRAFT;  Surgeon: Algernon Huxley, MD;  Location: [facility];  Service: Cardiovascular;  Laterality: N/A;   FOOT SURGERY     bilateral    HERNIA REPAIR     bilateral    KNEE SURGERY     right knee    PROSTATE SURGERY  09/2009   prostate implant   SPINE SURGERY       Current Meds  Medication Sig   albuterol (PROVENTIL HFA;VENTOLIN HFA) 108 (90 Base) MCG/ACT inhaler Inhale 2 puffs into the lungs every 6 (six) hours as needed for wheezing.   aspirin 81 MG tablet Take 81 mg by mouth daily.   atorvastatin (LIPITOR) 80 MG tablet TAKE 1 TABLET BY MOUTH DAILY   budesonide-formoterol (SYMBICORT) 160-4.5 MCG/ACT inhaler Inhale 2 puffs into the lungs 2 (two) times daily.   carvedilol (COREG) 3.125 MG tablet TAKE 1 TABLET BY MOUTH TWICE A DAY   Cholecalciferol (VITAMIN D3) 2000 units capsule Takes on occassion   montelukast (SINGULAIR) 10 MG tablet Take 10 mg by mouth daily.   Multiple Vitamins-Minerals (MULTIVITAMIN WITH MINERALS) tablet Takes occassionally   omeprazole (PRILOSEC) 40 MG capsule TAKE 1 CAPSULE BY MOUTH DAILY USUALLY 30MINUTES BEFORE BREAKFAST   polyethylene glycol powder (GLYCOLAX/MIRALAX) powder Take 17 g by mouth as needed.    sacubitril-valsartan (ENTRESTO) 97-103 MG Take 1 tablet by mouth 2 (two) times daily.   spironolactone (ALDACTONE) 25 MG tablet Take 0.5 tablets (12.5 mg total) by mouth daily.     Allergies:   Lyrica [pregabalin]  and Prednisone   Social History   Tobacco Use   Smoking status: Every Day    Packs/day: 0.25    Years: 56.00    Pack years: 14.00    Types: Cigarettes   Smokeless tobacco: Never   Tobacco comments:    back to smoking X1 year  Vaping Use   Vaping Use: Never used  Substance Use Topics   Alcohol use: No    Alcohol/week: 0.0 standard drinks   Drug use: No     Family Hx: The patient's family history includes Diabetes in his mother; Heart attack in his mother; Heart disease in his father; Lung cancer in his [name].  ROS:   Please see the history of present illness.  Review of Systems  Constitutional: Negative.   HENT: Negative.    Respiratory: Negative.    Cardiovascular: Negative.   Gastrointestinal: Negative.   Musculoskeletal:  Positive for back pain and joint pain.  Neurological: Negative.   Psychiatric/Behavioral: Negative.    All other systems reviewed and are negative.   Labs/Other Tests and Data Reviewed:    Recent Labs: 04/14/2020: BUN 13; Creatinine, Ser 1.00; Potassium 3.6; Sodium 138   Recent Lipid Panel Lab Results  Component Value Date/Time   CHOL 110 04/22/2018 08:01 AM   CHOL 154 03/04/2013 08:31 AM   TRIG 53 04/22/2018 08:01 AM   HDL 37 (L) 04/22/2018 08:01 AM   HDL 39 (L) 03/04/2013 08:31 AM   CHOLHDL 3.0 04/22/2018 08:01 AM   LDLCALC 62 04/22/2018 08:01 AM   LDLCALC 88 03/04/2013 08:31 AM    Wt Readings from Last 3 Encounters:  01/19/21 167 lb 6 oz (75.9 kg)  06/28/20 175 lb (79.4 kg)  03/26/20 185 lb (83.9 kg)     Exam:    BP (!) 158/80 (BP Location: Left Arm, Patient Position: Sitting, Cuff Size: Normal)   Pulse (!) 57   Ht 6' (1.829 m)   Wt 167 lb 6 oz (75.9 kg)   SpO2 98%   BMI 22.70 kg/m   Constitutional:  oriented to person, place, and time. No distress.  HENT:  Head: Grossly normal Eyes:  no discharge. No scleral icterus.  Neck: No JVD, no carotid bruits  Cardiovascular: Regular rate and rhythm, no murmurs  appreciated Pulmonary/Chest: Clear to auscultation bilaterally, no wheezes or rails Abdominal: Soft.  no distension.  no tenderness.  Musculoskeletal: Normal range of motion Neurological:  normal muscle tone. Coordination normal. No atrophy Skin: Skin warm and dry Psychiatric: normal affect, pleasant   ASSESSMENT & PLAN:    Dilated cardiomyopathy Continue Entresto, Coreg, spironolactone Previously declined evaluation for defibrillator  Lots of stress at home, exacerbated by taking care of his [name] who has medical issues At his request, we will hold off on adding additional medications at this time   Atherosclerosis of native coronary artery with stable angina pectoris, unspecified whether native or transplanted heart (HCC)  stress test with only small region of ischemia Echocardiogram ejection fraction 30 to 35%, no change on repeat echo Previously declined cardiac catheterization, declined defibrillator Continue Entresto and carvedilol, spironolactone  Centrilobular emphysema ([place]) -  Still smoking, one half pack per day Smoking cessation recommended Significant cough on today's visit, recommended with hot shower, nebulizer, Mucinex For any worsening productive green or yellow sputum, may need antibiotics Currently reports it is clear, chronic   Tobacco abuse counseling - We have encouraged him to continue to work on weaning his cigarettes and smoking cessation. He will continue to work on this and does not want any assistance with chantix.     COPD exacerbation (HCC) -  Uses inhalers,  Breathing stable, no exacerbations   AAA endograft placement Stressed importance of smoking cessation    Total encounter time more than 25 minutes  Greater than 50% was spent in counseling and coordination of care with the patient    Signed, Ida Rogue, MD  01/19/2021 4:07 PM    [place] Office [address] [place] [address], [place], [place]  [address]

## 2021-02-04 DIAGNOSIS — J441 Chronic obstructive pulmonary disease with (acute) exacerbation: Secondary | ICD-10-CM | POA: Diagnosis not present

## 2021-02-04 DIAGNOSIS — Z23 Encounter for immunization: Secondary | ICD-10-CM | POA: Diagnosis not present

## 2021-02-04 DIAGNOSIS — J40 Bronchitis, not specified as acute or chronic: Secondary | ICD-10-CM | POA: Diagnosis not present

## 2021-02-04 DIAGNOSIS — B349 Viral infection, unspecified: Secondary | ICD-10-CM | POA: Diagnosis not present

## 2021-02-21 ENCOUNTER — Inpatient Hospital Stay (HOSPITAL_COMMUNITY): Payer: PPO

## 2021-02-21 ENCOUNTER — Other Ambulatory Visit: Payer: Self-pay

## 2021-02-21 ENCOUNTER — Inpatient Hospital Stay
Admission: EM | Admit: 2021-02-21 | Discharge: 2021-02-21 | DRG: 082 | Disposition: A | Discharge status: Other Acute Inpatient Hospital | Payer: PPO | Attending: Internal Medicine | Admitting: Internal Medicine

## 2021-02-21 ENCOUNTER — Emergency Department: Payer: PPO

## 2021-02-21 ENCOUNTER — Inpatient Hospital Stay (HOSPITAL_COMMUNITY)
Admit: 2021-02-21 | Discharge: 2021-02-21 | Disposition: A | Discharge status: Home / Self Care | Payer: PPO | Attending: Internal Medicine | Admitting: Internal Medicine

## 2021-02-21 ENCOUNTER — Inpatient Hospital Stay: Payer: PPO

## 2021-02-21 ENCOUNTER — Inpatient Hospital Stay (HOSPITAL_COMMUNITY)
Admission: AD | Admit: 2021-02-21 | Discharge: 2021-02-26 | DRG: 064 | Disposition: A | Discharge status: Rehabilitation Facility | Payer: PPO | Source: Other Acute Inpatient Hospital | Attending: Neurosurgery | Admitting: Neurosurgery

## 2021-02-21 DIAGNOSIS — R55 Syncope and collapse: Secondary | ICD-10-CM

## 2021-02-21 DIAGNOSIS — I248 Other forms of acute ischemic heart disease: Secondary | ICD-10-CM | POA: Diagnosis not present

## 2021-02-21 DIAGNOSIS — I10 Essential (primary) hypertension: Secondary | ICD-10-CM | POA: Diagnosis present

## 2021-02-21 DIAGNOSIS — K5901 Slow transit constipation: Secondary | ICD-10-CM | POA: Diagnosis not present

## 2021-02-21 DIAGNOSIS — Z923 Personal history of irradiation: Secondary | ICD-10-CM | POA: Diagnosis not present

## 2021-02-21 DIAGNOSIS — I639 Cerebral infarction, unspecified: Secondary | ICD-10-CM

## 2021-02-21 DIAGNOSIS — S065XAA Traumatic subdural hemorrhage with loss of consciousness status unknown, initial encounter: Secondary | ICD-10-CM | POA: Diagnosis not present

## 2021-02-21 DIAGNOSIS — I110 Hypertensive heart disease with heart failure: Secondary | ICD-10-CM | POA: Diagnosis present

## 2021-02-21 DIAGNOSIS — I160 Hypertensive urgency: Secondary | ICD-10-CM | POA: Diagnosis present

## 2021-02-21 DIAGNOSIS — I69198 Other sequelae of nontraumatic intracerebral hemorrhage: Secondary | ICD-10-CM | POA: Diagnosis not present

## 2021-02-21 DIAGNOSIS — G936 Cerebral edema: Secondary | ICD-10-CM | POA: Diagnosis not present

## 2021-02-21 DIAGNOSIS — I252 Old myocardial infarction: Secondary | ICD-10-CM | POA: Diagnosis not present

## 2021-02-21 DIAGNOSIS — F17210 Nicotine dependence, cigarettes, uncomplicated: Secondary | ICD-10-CM | POA: Diagnosis not present

## 2021-02-21 DIAGNOSIS — I609 Nontraumatic subarachnoid hemorrhage, unspecified: Secondary | ICD-10-CM | POA: Diagnosis not present

## 2021-02-21 DIAGNOSIS — E876 Hypokalemia: Secondary | ICD-10-CM | POA: Diagnosis not present

## 2021-02-21 DIAGNOSIS — R2689 Other abnormalities of gait and mobility: Secondary | ICD-10-CM | POA: Diagnosis not present

## 2021-02-21 DIAGNOSIS — I161 Hypertensive emergency: Secondary | ICD-10-CM | POA: Diagnosis present

## 2021-02-21 DIAGNOSIS — J432 Centrilobular emphysema: Secondary | ICD-10-CM | POA: Diagnosis present

## 2021-02-21 DIAGNOSIS — Z833 Family history of diabetes mellitus: Secondary | ICD-10-CM

## 2021-02-21 DIAGNOSIS — D72829 Elevated white blood cell count, unspecified: Secondary | ICD-10-CM | POA: Diagnosis not present

## 2021-02-21 DIAGNOSIS — R350 Frequency of micturition: Secondary | ICD-10-CM | POA: Diagnosis present

## 2021-02-21 DIAGNOSIS — J439 Emphysema, unspecified: Secondary | ICD-10-CM | POA: Diagnosis not present

## 2021-02-21 DIAGNOSIS — Z720 Tobacco use: Secondary | ICD-10-CM

## 2021-02-21 DIAGNOSIS — B9620 Unspecified Escherichia coli [E. coli] as the cause of diseases classified elsewhere: Secondary | ICD-10-CM | POA: Diagnosis present

## 2021-02-21 DIAGNOSIS — I619 Nontraumatic intracerebral hemorrhage, unspecified: Secondary | ICD-10-CM | POA: Diagnosis not present

## 2021-02-21 DIAGNOSIS — J9811 Atelectasis: Secondary | ICD-10-CM | POA: Diagnosis not present

## 2021-02-21 DIAGNOSIS — I6201 Nontraumatic acute subdural hemorrhage: Secondary | ICD-10-CM | POA: Diagnosis not present

## 2021-02-21 DIAGNOSIS — I951 Orthostatic hypotension: Secondary | ICD-10-CM | POA: Diagnosis not present

## 2021-02-21 DIAGNOSIS — J9601 Acute respiratory failure with hypoxia: Secondary | ICD-10-CM | POA: Diagnosis not present

## 2021-02-21 DIAGNOSIS — J811 Chronic pulmonary edema: Secondary | ICD-10-CM | POA: Diagnosis not present

## 2021-02-21 DIAGNOSIS — E785 Hyperlipidemia, unspecified: Secondary | ICD-10-CM | POA: Diagnosis present

## 2021-02-21 DIAGNOSIS — Z20822 Contact with and (suspected) exposure to covid-19: Secondary | ICD-10-CM | POA: Diagnosis present

## 2021-02-21 DIAGNOSIS — I69118 Other symptoms and signs involving cognitive functions following nontraumatic intracerebral hemorrhage: Secondary | ICD-10-CM | POA: Diagnosis not present

## 2021-02-21 DIAGNOSIS — S065X9A Traumatic subdural hemorrhage with loss of consciousness of unspecified duration, initial encounter: Secondary | ICD-10-CM | POA: Diagnosis not present

## 2021-02-21 DIAGNOSIS — R300 Dysuria: Secondary | ICD-10-CM | POA: Diagnosis not present

## 2021-02-21 DIAGNOSIS — I5042 Chronic combined systolic (congestive) and diastolic (congestive) heart failure: Secondary | ICD-10-CM | POA: Diagnosis present

## 2021-02-21 DIAGNOSIS — I714 Abdominal aortic aneurysm, without rupture, unspecified: Secondary | ICD-10-CM | POA: Diagnosis present

## 2021-02-21 DIAGNOSIS — I739 Peripheral vascular disease, unspecified: Secondary | ICD-10-CM | POA: Diagnosis present

## 2021-02-21 DIAGNOSIS — Z801 Family history of malignant neoplasm of trachea, bronchus and lung: Secondary | ICD-10-CM

## 2021-02-21 DIAGNOSIS — K219 Gastro-esophageal reflux disease without esophagitis: Secondary | ICD-10-CM | POA: Diagnosis present

## 2021-02-21 DIAGNOSIS — S066X0A Traumatic subarachnoid hemorrhage without loss of consciousness, initial encounter: Secondary | ICD-10-CM | POA: Diagnosis not present

## 2021-02-21 DIAGNOSIS — Z79899 Other long term (current) drug therapy: Secondary | ICD-10-CM

## 2021-02-21 DIAGNOSIS — Z8673 Personal history of transient ischemic attack (TIA), and cerebral infarction without residual deficits: Secondary | ICD-10-CM | POA: Diagnosis present

## 2021-02-21 DIAGNOSIS — Y92230 Patient room in hospital as the place of occurrence of the external cause: Secondary | ICD-10-CM | POA: Diagnosis present

## 2021-02-21 DIAGNOSIS — I2510 Atherosclerotic heart disease of native coronary artery without angina pectoris: Secondary | ICD-10-CM | POA: Diagnosis not present

## 2021-02-21 DIAGNOSIS — W19XXXA Unspecified fall, initial encounter: Secondary | ICD-10-CM | POA: Diagnosis present

## 2021-02-21 DIAGNOSIS — S06360A Traumatic hemorrhage of cerebrum, unspecified, without loss of consciousness, initial encounter: Secondary | ICD-10-CM | POA: Diagnosis not present

## 2021-02-21 DIAGNOSIS — S065X1A Traumatic subdural hemorrhage with loss of consciousness of 30 minutes or less, initial encounter: Secondary | ICD-10-CM | POA: Diagnosis present

## 2021-02-21 DIAGNOSIS — R778 Other specified abnormalities of plasma proteins: Secondary | ICD-10-CM | POA: Diagnosis not present

## 2021-02-21 DIAGNOSIS — J449 Chronic obstructive pulmonary disease, unspecified: Secondary | ICD-10-CM | POA: Diagnosis present

## 2021-02-21 DIAGNOSIS — S065X0A Traumatic subdural hemorrhage without loss of consciousness, initial encounter: Secondary | ICD-10-CM | POA: Diagnosis not present

## 2021-02-21 DIAGNOSIS — A419 Sepsis, unspecified organism: Secondary | ICD-10-CM | POA: Diagnosis present

## 2021-02-21 DIAGNOSIS — W1839XA Other fall on same level, initial encounter: Secondary | ICD-10-CM | POA: Diagnosis present

## 2021-02-21 DIAGNOSIS — M25561 Pain in right knee: Secondary | ICD-10-CM | POA: Diagnosis not present

## 2021-02-21 DIAGNOSIS — M47812 Spondylosis without myelopathy or radiculopathy, cervical region: Secondary | ICD-10-CM | POA: Diagnosis not present

## 2021-02-21 DIAGNOSIS — Z888 Allergy status to other drugs, medicaments and biological substances status: Secondary | ICD-10-CM

## 2021-02-21 DIAGNOSIS — R7989 Other specified abnormal findings of blood chemistry: Secondary | ICD-10-CM | POA: Diagnosis present

## 2021-02-21 DIAGNOSIS — N390 Urinary tract infection, site not specified: Secondary | ICD-10-CM | POA: Diagnosis present

## 2021-02-21 DIAGNOSIS — K5900 Constipation, unspecified: Secondary | ICD-10-CM | POA: Diagnosis not present

## 2021-02-21 DIAGNOSIS — Z87442 Personal history of urinary calculi: Secondary | ICD-10-CM | POA: Diagnosis not present

## 2021-02-21 DIAGNOSIS — Z8546 Personal history of malignant neoplasm of prostate: Secondary | ICD-10-CM

## 2021-02-21 DIAGNOSIS — R9431 Abnormal electrocardiogram [ECG] [EKG]: Secondary | ICD-10-CM | POA: Diagnosis not present

## 2021-02-21 DIAGNOSIS — S06310A Contusion and laceration of right cerebrum without loss of consciousness, initial encounter: Secondary | ICD-10-CM | POA: Diagnosis not present

## 2021-02-21 DIAGNOSIS — R040 Epistaxis: Secondary | ICD-10-CM | POA: Diagnosis not present

## 2021-02-21 DIAGNOSIS — E440 Moderate protein-calorie malnutrition: Secondary | ICD-10-CM | POA: Diagnosis not present

## 2021-02-21 DIAGNOSIS — Y92009 Unspecified place in unspecified non-institutional (private) residence as the place of occurrence of the external cause: Secondary | ICD-10-CM | POA: Diagnosis present

## 2021-02-21 DIAGNOSIS — S06341A Traumatic hemorrhage of right cerebrum with loss of consciousness of 30 minutes or less, initial encounter: Secondary | ICD-10-CM | POA: Diagnosis not present

## 2021-02-21 DIAGNOSIS — Z7982 Long term (current) use of aspirin: Secondary | ICD-10-CM

## 2021-02-21 DIAGNOSIS — S061X1A Traumatic cerebral edema with loss of consciousness of 30 minutes or less, initial encounter: Secondary | ICD-10-CM | POA: Diagnosis present

## 2021-02-21 DIAGNOSIS — I6200 Nontraumatic subdural hemorrhage, unspecified: Secondary | ICD-10-CM | POA: Diagnosis not present

## 2021-02-21 DIAGNOSIS — M25562 Pain in left knee: Secondary | ICD-10-CM | POA: Diagnosis not present

## 2021-02-21 DIAGNOSIS — R32 Unspecified urinary incontinence: Secondary | ICD-10-CM | POA: Diagnosis not present

## 2021-02-21 DIAGNOSIS — S066X1A Traumatic subarachnoid hemorrhage with loss of consciousness of 30 minutes or less, initial encounter: Secondary | ICD-10-CM | POA: Diagnosis present

## 2021-02-21 DIAGNOSIS — S0003XA Contusion of scalp, initial encounter: Secondary | ICD-10-CM | POA: Diagnosis not present

## 2021-02-21 DIAGNOSIS — S2242XA Multiple fractures of ribs, left side, initial encounter for closed fracture: Secondary | ICD-10-CM | POA: Diagnosis not present

## 2021-02-21 DIAGNOSIS — Z8249 Family history of ischemic heart disease and other diseases of the circulatory system: Secondary | ICD-10-CM

## 2021-02-21 DIAGNOSIS — R252 Cramp and spasm: Secondary | ICD-10-CM | POA: Diagnosis not present

## 2021-02-21 DIAGNOSIS — I517 Cardiomegaly: Secondary | ICD-10-CM | POA: Diagnosis not present

## 2021-02-21 DIAGNOSIS — R569 Unspecified convulsions: Secondary | ICD-10-CM | POA: Diagnosis not present

## 2021-02-21 DIAGNOSIS — S199XXA Unspecified injury of neck, initial encounter: Secondary | ICD-10-CM | POA: Diagnosis not present

## 2021-02-21 DIAGNOSIS — S0240FA Zygomatic fracture, left side, initial encounter for closed fracture: Secondary | ICD-10-CM | POA: Diagnosis present

## 2021-02-21 DIAGNOSIS — L89152 Pressure ulcer of sacral region, stage 2: Secondary | ICD-10-CM | POA: Diagnosis not present

## 2021-02-21 DIAGNOSIS — Z4682 Encounter for fitting and adjustment of non-vascular catheter: Secondary | ICD-10-CM | POA: Diagnosis not present

## 2021-02-21 DIAGNOSIS — N3000 Acute cystitis without hematuria: Secondary | ICD-10-CM

## 2021-02-21 DIAGNOSIS — S02402A Zygomatic fracture, unspecified, initial encounter for closed fracture: Secondary | ICD-10-CM | POA: Diagnosis present

## 2021-02-21 DIAGNOSIS — Z955 Presence of coronary angioplasty implant and graft: Secondary | ICD-10-CM | POA: Diagnosis not present

## 2021-02-21 DIAGNOSIS — G441 Vascular headache, not elsewhere classified: Secondary | ICD-10-CM | POA: Diagnosis not present

## 2021-02-21 DIAGNOSIS — Z79890 Hormone replacement therapy (postmenopausal): Secondary | ICD-10-CM

## 2021-02-21 DIAGNOSIS — S0093XA Contusion of unspecified part of head, initial encounter: Secondary | ICD-10-CM | POA: Diagnosis not present

## 2021-02-21 LAB — CK TOTAL AND CKMB (NOT AT ARMC)
CK, MB: 4.7 ng/mL (ref 0.5–5.0)
Relative Index: 4.2 — ABNORMAL HIGH (ref 0.0–2.5)
Total CK: 113 U/L (ref 49–397)

## 2021-02-21 LAB — COMPREHENSIVE METABOLIC PANEL
ALT: 18 U/L (ref 0–44)
AST: 18 U/L (ref 15–41)
Albumin: 3.8 g/dL (ref 3.5–5.0)
Alkaline Phosphatase: 84 U/L (ref 38–126)
Anion gap: 8 (ref 5–15)
BUN: 18 mg/dL (ref 8–23)
CO2: 28 mmol/L (ref 22–32)
Calcium: 8.9 mg/dL (ref 8.9–10.3)
Chloride: 104 mmol/L (ref 98–111)
Creatinine, Ser: 0.96 mg/dL (ref 0.61–1.24)
GFR, Estimated: >60 mL/min (ref >60)
Glucose, Bld: 108 mg/dL — ABNORMAL HIGH (ref 70–99)
Potassium: 4.2 mmol/L (ref 3.5–5.1)
Sodium: 140 mmol/L (ref 135–145)
Total Bilirubin: 1.9 mg/dL — ABNORMAL HIGH (ref 0.3–1.2)
Total Protein: 7 g/dL (ref 6.5–8.1)

## 2021-02-21 LAB — BLOOD GAS, ARTERIAL
Acid-Base Excess: 1.6 mmol/L (ref 0.0–2.0)
Bicarbonate: 27.7 mmol/L (ref 20.0–28.0)
FIO2: 0.5
MECHVT: 450 mL
Mechanical Rate: 16
O2 Saturation: 98.1 %
PEEP: 5 cmH2O
Patient temperature: 37
pCO2 arterial: 48 mmHg (ref 32.0–48.0)
pH, Arterial: 7.37 (ref 7.350–7.450)
pO2, Arterial: 109 mmHg — ABNORMAL HIGH (ref 83.0–108.0)

## 2021-02-21 LAB — CBC WITH DIFFERENTIAL/PLATELET
Abs Immature Granulocytes: 0.07 10*3/uL (ref 0.00–0.07)
Basophils Absolute: 0 10*3/uL (ref 0.0–0.1)
Basophils Relative: 0 %
Eosinophils Absolute: 0.3 10*3/uL (ref 0.0–0.5)
Eosinophils Relative: 2 %
HCT: 47.9 % (ref 39.0–52.0)
Hemoglobin: 16.1 g/dL (ref 13.0–17.0)
Immature Granulocytes: 1 %
Lymphocytes Relative: 16 %
Lymphs Abs: 2 10*3/uL (ref 0.7–4.0)
MCH: 32.1 pg (ref 26.0–34.0)
MCHC: 33.6 g/dL (ref 30.0–36.0)
MCV: 95.4 fL (ref 80.0–100.0)
Monocytes Absolute: 0.8 10*3/uL (ref 0.1–1.0)
Monocytes Relative: 6 %
Neutro Abs: 9.7 10*3/uL — ABNORMAL HIGH (ref 1.7–7.7)
Neutrophils Relative %: 75 %
Platelets: 156 10*3/uL (ref 150–400)
RBC: 5.02 MIL/uL (ref 4.22–5.81)
RDW: 12.9 % (ref 11.5–15.5)
WBC: 12.9 10*3/uL — ABNORMAL HIGH (ref 4.0–10.5)
nRBC: 0 % (ref 0.0–0.2)

## 2021-02-21 LAB — CBG MONITORING, ED
Glucose-Capillary: 101 mg/dL — ABNORMAL HIGH (ref 70–99)
Glucose-Capillary: 115 mg/dL — ABNORMAL HIGH (ref 70–99)

## 2021-02-21 LAB — RESP PANEL BY RT-PCR (FLU A&B, COVID) ARPGX2
Influenza A by PCR: NEGATIVE
Influenza B by PCR: NEGATIVE
SARS Coronavirus 2 by RT PCR: NEGATIVE

## 2021-02-21 LAB — ECHOCARDIOGRAM COMPLETE
AR max vel: 3.38 cm2
AV Area VTI: 3.41 cm2
AV Area mean vel: 3.38 cm2
AV Mean grad: 6 mmHg
AV Peak grad: 9.9 mmHg
Ao pk vel: 1.57 m/s
Area-P 1/2: 4.17 cm2
Calc EF: 44.2 %
Height: 72 in
MV VTI: 3.74 cm2
S' Lateral: 4.2 cm
Single Plane A2C EF: 44 %
Single Plane A4C EF: 41.2 %
Weight: 2726.65 oz

## 2021-02-21 LAB — POCT I-STAT 7, (LYTES, BLD GAS, ICA,H+H)
Acid-Base Excess: 1 mmol/L (ref 0.0–2.0)
Bicarbonate: 26.7 mmol/L (ref 20.0–28.0)
Calcium, Ion: 1.19 mmol/L (ref 1.15–1.40)
HCT: 38 % — ABNORMAL LOW (ref 39.0–52.0)
Hemoglobin: 12.9 g/dL — ABNORMAL LOW (ref 13.0–17.0)
O2 Saturation: 99 %
Patient temperature: 98.1
Potassium: 3.6 mmol/L (ref 3.5–5.1)
Sodium: 138 mmol/L (ref 135–145)
TCO2: 28 mmol/L (ref 22–32)
pCO2 arterial: 47.4 mmHg (ref 32.0–48.0)
pH, Arterial: 7.357 (ref 7.350–7.450)
pO2, Arterial: 130 mmHg — ABNORMAL HIGH (ref 83.0–108.0)

## 2021-02-21 LAB — URINALYSIS, COMPLETE (UACMP) WITH MICROSCOPIC
Bilirubin Urine: NEGATIVE
Glucose, UA: NEGATIVE mg/dL
Ketones, ur: 5 mg/dL — AB
Nitrite: POSITIVE — AB
Protein, ur: 30 mg/dL — AB
Specific Gravity, Urine: 1.021 (ref 1.005–1.030)
Squamous Epithelial / HPF: NONE SEEN (ref 0–5)
pH: 5 (ref 5.0–8.0)

## 2021-02-21 LAB — BRAIN NATRIURETIC PEPTIDE: B Natriuretic Peptide: 1729.8 pg/mL — ABNORMAL HIGH (ref 0.0–100.0)

## 2021-02-21 LAB — LACTIC ACID, PLASMA: Lactic Acid, Venous: 1 mmol/L (ref 0.5–1.9)

## 2021-02-21 LAB — HEMOGLOBIN A1C
Hgb A1c MFr Bld: 5.5 % (ref 4.8–5.6)
Mean Plasma Glucose: 111.15 mg/dL

## 2021-02-21 LAB — GLUCOSE, CAPILLARY
Glucose-Capillary: 114 mg/dL — ABNORMAL HIGH (ref 70–99)
Glucose-Capillary: 95 mg/dL (ref 70–99)

## 2021-02-21 LAB — PROCALCITONIN: Procalcitonin: <0.1 ng/mL

## 2021-02-21 LAB — TROPONIN I (HIGH SENSITIVITY)
Troponin I (High Sensitivity): 21 ng/L — ABNORMAL HIGH (ref <18)
Troponin I (High Sensitivity): 17 ng/L (ref <18)
Troponin I (High Sensitivity): 21 ng/L — ABNORMAL HIGH (ref <18)

## 2021-02-21 LAB — PROTIME-INR
INR: 1.1 (ref 0.8–1.2)
Prothrombin Time: 14 seconds (ref 11.4–15.2)

## 2021-02-21 LAB — APTT: aPTT: 31 seconds (ref 24–36)

## 2021-02-21 LAB — MRSA NEXT GEN BY PCR, NASAL: MRSA by PCR Next Gen: NOT DETECTED

## 2021-02-21 MED ORDER — FENTANYL CITRATE PF 50 MCG/ML IJ SOSY: 25.0000 ug | PREFILLED_SYRINGE | INTRAMUSCULAR | Status: DC | PRN

## 2021-02-21 MED ORDER — VITAMIN D 25 MCG (1000 UNIT) PO TABS
2000.0000 [IU] | ORAL_TABLET | Freq: Every day | ORAL | Status: DC
Start: 2021-02-21 — End: 2021-02-21
  Administered 2021-02-21: 2000 [IU] via ORAL
  Filled 2021-02-21: qty 2

## 2021-02-21 MED ORDER — CARVEDILOL 6.25 MG PO TABS: 3.1250 mg | ORAL_TABLET | Freq: Two times a day (BID) | ORAL | Status: DC

## 2021-02-21 MED ORDER — ONDANSETRON HCL 4 MG/2ML IJ SOLN: 4.0000 mg | Freq: Three times a day (TID) | INTRAMUSCULAR | Status: DC | PRN

## 2021-02-21 MED ORDER — IPRATROPIUM-ALBUTEROL 0.5-2.5 (3) MG/3ML IN SOLN: 3.0000 mL | RESPIRATORY_TRACT | Status: DC | PRN

## 2021-02-21 MED ORDER — ALBUTEROL SULFATE (2.5 MG/3ML) 0.083% IN NEBU
2.5000 mg | INHALATION_SOLUTION | Freq: Four times a day (QID) | RESPIRATORY_TRACT | Status: DC | PRN
  Administered 2021-02-22 – 2021-02-24 (×2): 2.5 mg via RESPIRATORY_TRACT
  Filled 2021-02-21 (×2): qty 3

## 2021-02-21 MED ORDER — POLYETHYLENE GLYCOL 3350 17 GM/SCOOP PO POWD
17.0000 g | Freq: Every day | ORAL | Status: DC | PRN
  Filled 2021-02-21: qty 255

## 2021-02-21 MED ORDER — ARFORMOTEROL TARTRATE 15 MCG/2ML IN NEBU
15.0000 ug | INHALATION_SOLUTION | Freq: Two times a day (BID) | RESPIRATORY_TRACT | Status: DC
  Administered 2021-02-21 – 2021-02-26 (×9): 15 ug via RESPIRATORY_TRACT
  Filled 2021-02-21 (×10): qty 2

## 2021-02-21 MED ORDER — PROPOFOL 1000 MG/100ML IV EMUL
0.0000 ug/kg/min | INTRAVENOUS | Status: DC
  Administered 2021-02-21: 5 ug/kg/min via INTRAVENOUS
  Filled 2021-02-21: qty 100

## 2021-02-21 MED ORDER — CHLORHEXIDINE GLUCONATE 0.12% ORAL RINSE (MEDLINE KIT)
15.0000 mL | Freq: Two times a day (BID) | OROMUCOSAL | Status: DC
  Administered 2021-02-21 – 2021-02-22 (×2): 15 mL via OROMUCOSAL

## 2021-02-21 MED ORDER — DM-GUAIFENESIN ER 30-600 MG PO TB12
1.0000 | ORAL_TABLET | Freq: Two times a day (BID) | ORAL | Status: DC | PRN
  Administered 2021-02-21: 1 via ORAL
  Filled 2021-02-21: qty 1

## 2021-02-21 MED ORDER — OXYCODONE-ACETAMINOPHEN 5-325 MG PO TABS
1.0000 | ORAL_TABLET | Freq: Four times a day (QID) | ORAL | Status: DC | PRN
  Administered 2021-02-21: 1 via ORAL
  Filled 2021-02-21: qty 1

## 2021-02-21 MED ORDER — DOCUSATE SODIUM 50 MG/5ML PO LIQD
100.0000 mg | Freq: Two times a day (BID) | ORAL | Status: DC
  Administered 2021-02-22: 100 mg
  Filled 2021-02-21: qty 10

## 2021-02-21 MED ORDER — FENTANYL CITRATE PF 50 MCG/ML IJ SOSY
100.0000 ug | PREFILLED_SYRINGE | Freq: Once | INTRAMUSCULAR | Status: AC
  Administered 2021-02-21: 100 ug via INTRAVENOUS
  Filled 2021-02-21: qty 2

## 2021-02-21 MED ORDER — ROCURONIUM BROMIDE 10 MG/ML (PF) SYRINGE
PREFILLED_SYRINGE | INTRAVENOUS | Status: AC
  Filled 2021-02-21: qty 10

## 2021-02-21 MED ORDER — SACUBITRIL-VALSARTAN 97-103 MG PO TABS
1.0000 | ORAL_TABLET | Freq: Two times a day (BID) | ORAL | Status: DC
  Administered 2021-02-21: 1 via ORAL
  Filled 2021-02-21 (×2): qty 1

## 2021-02-21 MED ORDER — DOCUSATE SODIUM 50 MG/5ML PO LIQD
100.0000 mg | Freq: Two times a day (BID) | ORAL | Status: DC
  Filled 2021-02-21 (×2): qty 10

## 2021-02-21 MED ORDER — SODIUM CHLORIDE 0.9 % IV SOLN
1.0000 g | INTRAVENOUS | Status: DC
  Administered 2021-02-21: 1 g via INTRAVENOUS
  Filled 2021-02-21: qty 10

## 2021-02-21 MED ORDER — ORAL CARE MOUTH RINSE
15.0000 mL | OROMUCOSAL | Status: DC
  Administered 2021-02-21 – 2021-02-22 (×10): 15 mL via OROMUCOSAL

## 2021-02-21 MED ORDER — PANTOPRAZOLE SODIUM 40 MG IV SOLR
40.0000 mg | Freq: Every day | INTRAVENOUS | Status: DC
  Administered 2021-02-21 – 2021-02-23 (×3): 40 mg via INTRAVENOUS
  Filled 2021-02-21 (×3): qty 40

## 2021-02-21 MED ORDER — POLYETHYLENE GLYCOL 3350 17 GM/SCOOP PO POWD
17.0000 g | ORAL | Status: DC | PRN
  Filled 2021-02-21: qty 255

## 2021-02-21 MED ORDER — CEPHALEXIN 500 MG PO CAPS: 500.0000 mg | ORAL_CAPSULE | Freq: Three times a day (TID) | ORAL | Status: DC

## 2021-02-21 MED ORDER — HYDRALAZINE HCL 20 MG/ML IJ SOLN
10.0000 mg | Freq: Once | INTRAMUSCULAR | Status: AC
  Administered 2021-02-21: 10 mg via INTRAVENOUS
  Filled 2021-02-21: qty 1

## 2021-02-21 MED ORDER — POLYETHYLENE GLYCOL 3350 17 G PO PACK: 17.0000 g | PACK | Freq: Every day | ORAL | Status: DC

## 2021-02-21 MED ORDER — MOMETASONE FURO-FORMOTEROL FUM 200-5 MCG/ACT IN AERO
2.0000 | INHALATION_SPRAY | Freq: Two times a day (BID) | RESPIRATORY_TRACT | Status: DC
  Administered 2021-02-21: 2 via RESPIRATORY_TRACT
  Filled 2021-02-21: qty 8.8

## 2021-02-21 MED ORDER — LORAZEPAM 2 MG/ML IJ SOLN
1.0000 mg | Freq: Once | INTRAMUSCULAR | Status: AC
  Administered 2021-02-21: 1 mg via INTRAVENOUS
  Filled 2021-02-21: qty 1

## 2021-02-21 MED ORDER — PROPOFOL 1000 MG/100ML IV EMUL
0.0000 ug/kg/min | INTRAVENOUS | Status: DC
  Administered 2021-02-21: 20 ug/kg/min via INTRAVENOUS
  Administered 2021-02-21: 30 ug/kg/min via INTRAVENOUS
  Administered 2021-02-22: 20 ug/kg/min via INTRAVENOUS
  Filled 2021-02-21 (×2): qty 100

## 2021-02-21 MED ORDER — BUDESONIDE 0.5 MG/2ML IN SUSP
0.5000 mg | Freq: Two times a day (BID) | RESPIRATORY_TRACT | Status: DC
  Administered 2021-02-21 – 2021-02-26 (×10): 0.5 mg via RESPIRATORY_TRACT
  Filled 2021-02-21 (×10): qty 2

## 2021-02-21 MED ORDER — ALBUTEROL SULFATE (2.5 MG/3ML) 0.083% IN NEBU: 2.5000 mg | INHALATION_SOLUTION | RESPIRATORY_TRACT | Status: DC | PRN

## 2021-02-21 MED ORDER — ATORVASTATIN CALCIUM 20 MG PO TABS: 80.0000 mg | ORAL_TABLET | Freq: Every day | ORAL | Status: DC

## 2021-02-21 MED ORDER — FENTANYL BOLUS VIA INFUSION
25.0000 ug | INTRAVENOUS | Status: DC | PRN
  Administered 2021-02-21: 25 ug via INTRAVENOUS
  Filled 2021-02-21: qty 100

## 2021-02-21 MED ORDER — ADULT MULTIVITAMIN W/MINERALS CH
1.0000 | ORAL_TABLET | Freq: Every day | ORAL | Status: DC
  Administered 2021-02-21: 1 via ORAL
  Filled 2021-02-21: qty 1

## 2021-02-21 MED ORDER — PANTOPRAZOLE SODIUM 40 MG PO TBEC
40.0000 mg | DELAYED_RELEASE_TABLET | Freq: Every day | ORAL | Status: DC
  Administered 2021-02-21: 40 mg via ORAL
  Filled 2021-02-21: qty 1

## 2021-02-21 MED ORDER — ROCURONIUM BROMIDE 50 MG/5ML IV SOLN
50.0000 mg | Freq: Once | INTRAVENOUS | Status: AC
  Administered 2021-02-21: 50 mg via INTRAVENOUS
  Filled 2021-02-21: qty 5

## 2021-02-21 MED ORDER — SPIRONOLACTONE 25 MG PO TABS
12.5000 mg | ORAL_TABLET | Freq: Every day | ORAL | Status: DC
  Administered 2021-02-21: 12.5 mg via ORAL
  Filled 2021-02-21: qty 0.5

## 2021-02-21 MED ORDER — ACETAMINOPHEN 325 MG PO TABS: 650.0000 mg | ORAL_TABLET | Freq: Four times a day (QID) | ORAL | Status: DC | PRN

## 2021-02-21 MED ORDER — NICOTINE 21 MG/24HR TD PT24
21.0000 mg | MEDICATED_PATCH | Freq: Every day | TRANSDERMAL | Status: DC
  Filled 2021-02-21: qty 1

## 2021-02-21 MED ORDER — MONTELUKAST SODIUM 10 MG PO TABS: 10.0000 mg | ORAL_TABLET | Freq: Every day | ORAL | Status: DC

## 2021-02-21 MED ORDER — SODIUM CHLORIDE 0.9 % IV SOLN
1.0000 g | INTRAVENOUS | Status: AC
  Administered 2021-02-21 – 2021-02-25 (×5): 1 g via INTRAVENOUS
  Filled 2021-02-21 (×6): qty 10

## 2021-02-21 MED ORDER — ETOMIDATE 2 MG/ML IV SOLN
20.0000 mg | Freq: Once | INTRAVENOUS | Status: AC
  Administered 2021-02-21: 20 mg via INTRAVENOUS
  Filled 2021-02-21: qty 10

## 2021-02-21 MED ORDER — CHLORHEXIDINE GLUCONATE CLOTH 2 % EX PADS
6.0000 | MEDICATED_PAD | Freq: Every day | CUTANEOUS | Status: DC
  Administered 2021-02-21 – 2021-02-24 (×4): 6 via TOPICAL

## 2021-02-21 MED ORDER — FENTANYL CITRATE PF 50 MCG/ML IJ SOSY
25.0000 ug | PREFILLED_SYRINGE | INTRAMUSCULAR | Status: DC | PRN
  Administered 2021-02-22: 25 ug via INTRAVENOUS
  Filled 2021-02-21: qty 1

## 2021-02-21 MED ORDER — CARVEDILOL 3.125 MG PO TABS
3.1250 mg | ORAL_TABLET | Freq: Two times a day (BID) | ORAL | Status: DC
  Administered 2021-02-22: 3.125 mg
  Filled 2021-02-21: qty 1

## 2021-02-21 MED ORDER — NICARDIPINE HCL IN NACL 20-0.86 MG/200ML-% IV SOLN
3.0000 mg/h | INTRAVENOUS | Status: DC
  Administered 2021-02-21: 5 mg/h via INTRAVENOUS
  Administered 2021-02-21: 2.5 mg/h via INTRAVENOUS
  Filled 2021-02-21 (×2): qty 200

## 2021-02-21 MED ORDER — ACETAMINOPHEN 500 MG PO TABS
1000.0000 mg | ORAL_TABLET | Freq: Once | ORAL | Status: AC
  Administered 2021-02-21: 1000 mg via ORAL
  Filled 2021-02-21: qty 2

## 2021-02-21 MED ORDER — FAMOTIDINE IN NACL 20-0.9 MG/50ML-% IV SOLN
20.0000 mg | Freq: Two times a day (BID) | INTRAVENOUS | Status: DC
  Filled 2021-02-21: qty 50

## 2021-02-21 MED ORDER — FENTANYL 2500MCG IN NS 250ML (10MCG/ML) PREMIX INFUSION
25.0000 ug/h | INTRAVENOUS | Status: DC
  Administered 2021-02-21: 25 ug/h via INTRAVENOUS
  Filled 2021-02-21: qty 250

## 2021-02-21 NOTE — ED Provider Notes (Signed)
[facility] Emergency Department Provider Note  ____________________________________________  Time seen: Approximately 5:02 AM  I have reviewed the triage vital signs and the nursing notes.   HISTORY  Chief Complaint Loss of Consciousness   HPI [name] is a 75 y.o. male with a history of AAA, COPD, CAD, hypertension, hyperlipidemia, prostate cancer status post seeding, peripheral vascular disease, stroke was being evaluated for syncopal event.  Patient was here with his wife who is a patient in the emergency room, when was started hearing the wife screaming from the room.  We immediately walked in the room and found patient unconscious on the floor with epistaxis.  Patient had urinated on himself.  No seizure-like activity.  Loss of consciousness lasted about a minute.  Patient seemed confused for 5 to 10 minutes after regaining consciousness.  Patient denies any history of seizures.  Patient still slightly confused and has no recollection of passing out.  According to the wife he has been urinating every 15 minutes and has had dysuria over the last few days.  Patient denies headache, neck pain, back pain, chest pain, abdominal pain, shortness of breath.  Is not on blood thinners.   Past Medical History:  Diagnosis Date   AAA (abdominal aortic aneurysm)    a.  CTA 7/19: measured 4.5 x 5.3 cm and greatest transverse dimensions   Abnormal nuclear stress test    a.  Myoview 2012: anterior wall ischemia with an estimated EF of 26%.  This was a new wall motion abnormality as well as a newly reduced EF   COPD (chronic obstructive pulmonary disease) (HCC)    Coronary artery disease    a.  Posterior MI in 2002 status post BMS to the LCx; b. [employer] 2012: 95% stenosis of the proximal LAD, 95% stenosis of the mid LAD, 30% in-stent restenosis of the mid left circumflex with a second lesion of diffuse 50% stenosis.  The patient underwent successful PCI/BMS to the mid LAD  with 0% residual stenosis, LV gram not performed   GERD (gastroesophageal reflux disease)    History of kidney stones    History of prostate cancer    Hyperlipidemia    Hypertension    Myocardial infarction [facility])    Neuromuscular disorder ([place])    Prostate cancer ([place])    a.  Status post seeding   PVD (peripheral vascular disease) ([place])    Stroke [facility])     Patient Active Problem List   Diagnosis Date Noted   Other spondylosis with radiculopathy, lumbar region 09/04/2018   Centrilobular emphysema ([place]) 07/19/2018   Dilated cardiomyopathy ([place]) 07/19/2018   Coronary artery disease of native artery of native heart with stable angina pectoris ([place]) 01/20/2018   AAA (abdominal aortic aneurysm) without rupture 10/30/2017   Personal history of tobacco use, presenting hazards to health 08/29/2016   COPD exacerbation ([place]) 04/03/2015   Medicare annual wellness visit, subsequent 05/23/2014   Barrett's esophagus 05/20/2014   Constipation 05/20/2014   Arthritis of hand 05/07/2014   COPD (chronic obstructive pulmonary disease) ([place]) 11/06/2011   Pulmonary nodule 11/06/2011   Tobacco abuse counseling 02/12/2011   CAD, NATIVE VESSEL 10/18/2008   COLONIC POLYPS 08/05/2008   Hyperlipidemia 08/05/2008   Essential hypertension 08/05/2008   GERD 08/05/2008    Past Surgical History:  Procedure Laterality Date   BACK SURGERY  2009/2010   x 2   CARDIAC CATHETERIZATION  2002   stent placement [place]   ENDOVASCULAR REPAIR/STENT GRAFT N/A  01/02/2018   Procedure: ENDOVASCULAR REPAIR/STENT GRAFT;  Surgeon: Algernon Huxley, MD;  Location: [facility];  Service: Cardiovascular;  Laterality: N/A;   FOOT SURGERY     bilateral    HERNIA REPAIR     bilateral    KNEE SURGERY     right knee    PROSTATE SURGERY  09/2009   prostate implant   SPINE SURGERY      Prior to Admission medications   Medication Sig Start Date End Date Taking? Authorizing Provider  albuterol (PROVENTIL  HFA;VENTOLIN HFA) 108 (90 Base) MCG/ACT inhaler Inhale 2 puffs into the lungs every 6 (six) hours as needed for wheezing. 03/19/17 02/21/21 Yes Juanito Doom, MD  aspirin 81 MG tablet Take 81 mg by mouth daily.   Yes [provider]  atorvastatin (LIPITOR) 80 MG tablet Take 1 tablet (80 mg total) by mouth daily. 01/19/21  Yes Minna Merritts, MD  budesonide-formoterol (SYMBICORT) 160-4.5 MCG/ACT inhaler Inhale 2 puffs into the lungs 2 (two) times daily. 12/12/18  Yes Juanito Doom, MD  carvedilol (COREG) 3.125 MG tablet Take 1 tablet (3.125 mg total) by mouth 2 (two) times daily. 01/19/21  Yes Minna Merritts, MD  Cholecalciferol (VITAMIN D3) 2000 units capsule Takes on occassion   Yes [provider]  montelukast (SINGULAIR) 10 MG tablet Take 10 mg by mouth daily. 02/16/20 02/21/21 Yes [provider]  Multiple Vitamins-Minerals (MULTIVITAMIN WITH MINERALS) tablet Takes occassionally   Yes [provider]  omeprazole (PRILOSEC) 40 MG capsule TAKE 1 CAPSULE BY MOUTH DAILY USUALLY 30MINUTES BEFORE BREAKFAST 01/28/19  Yes Crecencio Mc, MD  sacubitril-valsartan (ENTRESTO) 97-103 MG Take 1 tablet by mouth 2 (two) times daily. 01/19/21  Yes Minna Merritts, MD  spironolactone (ALDACTONE) 25 MG tablet Take 0.5 tablets (12.5 mg total) by mouth daily. 01/19/21 07/18/21 Yes Gollan, Kathlene November, MD  polyethylene glycol powder (GLYCOLAX/MIRALAX) powder Take 17 g by mouth as needed.  04/24/11   [provider]    Allergies Lyrica [pregabalin] and Prednisone  Family History  Problem Relation Age of Onset   Heart attack Mother    Diabetes Mother    Heart disease Father    Lung cancer Daughter     Social History Social History   Tobacco Use   Smoking status: Every Day    Packs/day: 0.25    Years: 56.00    Pack years: 14.00    Types: Cigarettes   Smokeless tobacco: Never   Tobacco comments:    back to smoking X1 year  Vaping Use   Vaping Use:  Never used  Substance Use Topics   Alcohol use: No    Alcohol/week: 0.0 standard drinks   Drug use: No    Review of Systems  Constitutional: Negative for fever. + syncope Eyes: Negative for visual changes. ENT: Negative for sore throat. + epistaxis Neck: No neck pain  Cardiovascular: Negative for chest pain. Respiratory: Negative for shortness of breath. Gastrointestinal: Negative for abdominal pain, vomiting or diarrhea. Genitourinary: Negative for dysuria. Musculoskeletal: Negative for back pain. Skin: Negative for rash. Neurological: Negative for headaches, weakness or numbness. Psych: No SI or HI  ____________________________________________   PHYSICAL EXAM:  VITAL SIGNS: ED Triage Vitals  Enc Vitals Group     BP 02/21/21 0451 (!) 233/141     Pulse Rate 02/21/21 0451 69     Resp 02/21/21 0451 (!) 23     Temp --      Temp src --  SpO2 02/21/21 0451 94 %     Weight --      Height 02/21/21 0453 6' (1.829 m)     Head Circumference --      Peak Flow --      Pain Score --      Pain Loc --      Pain Edu? --      Excl. in [place]? --     Constitutional: Alert and slow to answer questions, no distress.  HEENT:      Head: Normocephalic and atraumatic.         Eyes: Conjunctivae are normal. Sclera is non-icteric.       Nose: epistaxis      Mouth/Throat: Mucous membranes are moist.       Neck: Supple with no signs of meningismus. No c-spine tenderness, c-collar was placed on patient Cardiovascular: Regular rate and rhythm. No murmurs, gallops, or rubs. 2+ symmetrical distal pulses are present in all extremities. No JVD. Respiratory: Normal respiratory effort. Lungs are clear to auscultation bilaterally.  Gastrointestinal: Soft, non tender, and non distended. Musculoskeletal:  No edema, cyanosis, or erythema of extremities. Full painless range of motion of all 4 extremities.  No midline spine tenderness, pelvis stable Neurologic: Normal speech and language. Face is  symmetric. Moving all extremities. No gross focal neurologic deficits are appreciated. Skin: Skin is warm, dry and intact. No rash noted. Psychiatric: Mood and affect are normal. Speech and behavior are normal.  ____________________________________________   LABS (all labs ordered are listed, but only abnormal results are displayed)  Labs Reviewed  CBC WITH DIFFERENTIAL/PLATELET - Abnormal; Notable for the following components:      Result Value   WBC 12.9 (*)    Neutro Abs 9.7 (*)    All other components within normal limits  COMPREHENSIVE METABOLIC PANEL - Abnormal; Notable for the following components:   Glucose, Bld 108 (*)    Total Bilirubin 1.9 (*)    All other components within normal limits  TROPONIN I (HIGH SENSITIVITY) - Abnormal; Notable for the following components:   Troponin I (High Sensitivity) 21 (*)    All other components within normal limits  URINALYSIS, COMPLETE (UACMP) WITH MICROSCOPIC  TROPONIN I (HIGH SENSITIVITY)   ____________________________________________  EKG  ED ECG REPORT I, [name], the attending physician, personally viewed and interpreted this ECG.  Normal sinus rhythm, normal intervals, normal axis, no STE or depressions, no evidence of HOCM, AV block, delta wave, ARVD, prolonged QTc, WPW, or [place].  Unchanged from prior.   ____________________________________________  RADIOLOGY  I have personally reviewed the images performed during this visit and I agree with the Radiologist's read.   Interpretation by Radiologist:  CT HEAD WO CONTRAST (5MM)  Result Date: 02/21/2021 CLINICAL DATA:  Head trauma. EXAM: CT HEAD WITHOUT CONTRAST TECHNIQUE: Contiguous axial images were obtained from the base of the skull through the vertex without intravenous contrast. COMPARISON:  None. FINDINGS: Brain: There is a right cerebral convexity acute subdural hematoma, for the most part measuring 3-4 mm with a maximal diameter up to 6 mm along the  frontoparietal vertex. Some of the blood layers along the right posterior falx where it measures up to 4.4 mm in thickness, and there is 3 mm of right-to-left midline shift at the level of the roof of the third ventricle, and slight crowding of the right cerebral gyri in frontoparietal area. There are mild features of atrophy and small vessel disease. There are bilateral gangliocapsular lacunar infarcts with  chronic appearance with no old territorial infarct. There is a small wedge-shaped chronic infarct in the inferior left cerebellar hemisphere, with the brainstem and cerebellum otherwise unremarkable. There is a normal ventricular size. Vascular: There patchy calcifications in the carotid siphons, distal vertebral arteries. There are no hyperdense central vessels. Skull: The calvarium, skull base and orbits are intact. There are no focal bone lesions. There is a small left temporal scalp hematoma. There is a nondisplaced fracture of left zygomatic arch. Sinuses/Orbits: Unremarkable orbital contents. There are dense fluid levels in the bilateral sphenoid sinus and posterior right ethmoid sinus most likely due to hemorrhagic fluid levels, but the sinus walls appear intact. There is mild membrane thickening in maxillary and ethmoid air cells. No mastoid effusion is seen. Other: None. IMPRESSION: 1. Right cerebral convexity acute subdural hematoma measuring up to 6 mm in thickness at the vertex and up to 4.3 mm thickness along the right posterior falx. 2. 3 mm right to left midline shift and slight crowding of the right frontoparietal gyri, but no downward mass effect or significant ventricular effacement. The third ventricle and sylvian aqueduct remain patent. 3. Atrophy, small vessel disease and gangliocapsular lacunar infarcts with chronic appearance and small chronic wedge-shaped left cerebellar infarct. 4. Hemorrhagic fluid levels in the bilateral sphenoid and right posterior ethmoid sinus. Sinus membrane  disease. The sinus walls are grossly intact. 5. Nondisplaced fracture of left zygomatic arch. 6. Results phoned to Dr. Alfred Levins at 5:44 a.m., 02/21/2021, with read back. Electronically Signed   By: Telford Nab M.D.   On: 02/21/2021 05:53   CT Cervical Spine Wo Contrast  Result Date: 02/21/2021 CLINICAL DATA:  75 year old male found down. EXAM: CT CERVICAL SPINE WITHOUT CONTRAST TECHNIQUE: Multidetector CT imaging of the cervical spine was performed without intravenous contrast. Multiplanar CT image reconstructions were also generated. COMPARISON:  None. FINDINGS: Alignment: Preserved cervical lordosis. Mild dextroconvex cervical curvature. Cervicothoracic junction alignment is within normal limits. Bilateral posterior element alignment is within normal limits. Skull base and vertebrae: Visualized skull base is intact. No atlanto-occipital dissociation. C1 and C2 appear intact and aligned. No acute osseous abnormality identified in the cervical spine. Soft tissues and spinal canal: No prevertebral fluid or swelling. No visible canal hematoma. Calcified bilateral cervical carotid atherosclerosis. Otherwise negative visible noncontrast neck soft tissues. Disc levels: Multilevel advanced left side cervical facet arthropathy, but mild for age disc and endplate degeneration. No significant spinal stenosis suspected. Upper chest: Visible upper thoracic levels appear intact. Negative lung apices. Other: Absent dentition. Nondisplaced comminution of the left zygomatic arch on series 2, image 6. Head CT today reported separately. IMPRESSION: 1. No acute traumatic injury identified in the cervical spine. 2. Nondisplaced fracture of the left zygomatic arch. Electronically Signed   By: Genevie Ann M.D.   On: 02/21/2021 06:01   DG Chest Portable 1 View  Result Date: 02/21/2021 CLINICAL DATA:  Syncope, urinary incontinence EXAM: PORTABLE CHEST 1 VIEW COMPARISON:  Prior chest x-ray 03/19/2017 FINDINGS: Cardiomegaly,  significantly progressed compared to prior. Atherosclerotic calcifications present in the thoracic aorta. The aorta is tortuous. New pulmonary vascular congestion without overt edema. No focal airspace infiltrate. No pleural effusion or pneumothorax. No acute osseous abnormality. IMPRESSION: Cardiomegaly and pulmonary vascular congestion without overt edema. Aortic atherosclerotic calcifications. Electronically Signed   By: Jacqulynn Cadet M.D.   On: 02/21/2021 05:35     ____________________________________________   PROCEDURES  Procedure(s) performed:yes .1-3 Lead EKG Interpretation Performed by: Nathaniel Re, MD Authorized by: Nathaniel Re, MD  Interpretation: non-specific     ECG rate assessment: normal     Rhythm: sinus rhythm     Ectopy: none     Conduction: normal     Critical Care performed: yes  CRITICAL CARE Performed by: [name]  ?  Total critical care time: 30 min  Critical care time was exclusive of separately billable procedures and treating other patients.  Critical care was necessary to treat or prevent imminent or life-threatening deterioration.  Critical care was time spent personally by me on the following activities: development of treatment plan with patient and/or surrogate as well as nursing, discussions with consultants, evaluation of patient's response to treatment, examination of patient, obtaining history from patient or surrogate, ordering and performing treatments and interventions, ordering and review of laboratory studies, ordering and review of radiographic studies, pulse oximetry and Ramirez-evaluation of patient's condition.  ____________________________________________   INITIAL IMPRESSION / ASSESSMENT AND PLAN / ED COURSE  75 y.o. male with a history of AAA, COPD, CAD, hypertension, hyperlipidemia, prostate cancer status post seeding, peripheral vascular disease, stroke was being evaluated for syncopal event.  Patient was  seen in the emergency room with his wife when he had a syncopal event.  We responded to the room immediately and there was no seizure-like activity.  Patient was incontinent of urine however his wife says that he has been having dysuria and frequency for the last couple of days.  Patient had LOC for about 1 minute and was slightly confused up to 10 minutes after the episode.  No known prior history of seizures.  He is neurologically intact, abdomen is soft and nontender, heart regular rate and rhythm.  EKG with no signs of dysrhythmias or ischemia.  BP is elevated at 233/141.  Review of epic shows the patient has a history of elevated blood pressure but never this high.  CBG of 101.  We will keep patient on telemetry to rule out dysrhythmias.  We will cycle troponins to rule out a cardiac event, will get CT head and cervical spine.  Patient was placed on a c-collar immediately after the syncopal event.  We will check labs for any signs of electrolyte derangements, dehydration, sepsis, anemia.  Old medical records reviewed.   _________________________ 6:29 AM on 02/21/2021 ----------------------------------------- CT concerning for a small subdural hematoma with a right to left midline shift.  Patient's mental status has improved significantly.  BP continue to be elevated therefore patient was given a dose of hydralazine.  Nicardipine has been ordered in case blood pressure is above 160/90.  Discussed with Dr. Cari Caraway from neurosurgery who said from his perspective there is no need for surgical intervention at this time, only a repeat head CT in 6 hours to evaluate for any changes.  Patient is not anticoagulated.  His first troponin is positive making me concerned for cardiac event leading to the syncopal event.  He is on telemetry at this time showing normal sinus rhythm.  We will discussed with the hospitalist for admission.  Also discussed with the patient the finding of nondisplaced fracture of the left  zygomatic arch.  The results of patient's imaging studies and plan were discussed with patient and his son who was at bedside.      _____________________________________________ Please note:  Patient was evaluated in Emergency Department today for the symptoms described in the history of present illness. Patient was evaluated in the context of the global COVID-19 pandemic, which necessitated consideration that the patient might be at risk for  infection with the SARS-CoV-2 virus that causes COVID-19. Institutional protocols and algorithms that pertain to the evaluation of patients at risk for COVID-19 are in a state of rapid change based on information released by regulatory bodies including the CDC and federal and state organizations. These policies and algorithms were followed during the patient's care in the ED.  Some ED evaluations and interventions may be delayed as a result of limited staffing during the pandemic.   [place] Controlled Substance Database was reviewed by me. ____________________________________________   FINAL CLINICAL IMPRESSION(S) / ED DIAGNOSES   Final diagnoses:  Syncope, unspecified syncope type  SDH (subdural hematoma)      NEW MEDICATIONS STARTED DURING THIS VISIT:  ED Discharge Orders     None        Note:  This document was prepared using Dragon voice recognition software and may include unintentional dictation errors.    Alfred Levins, [place], MD 02/21/21 0630

## 2021-02-21 NOTE — Consult Note (Signed)
NAME:  SILVIO SAUSEDO, MRN:  [id number], DOB:  Jul 07, 1945, LOS: 0 ADMISSION DATE:  02/21/2021, CONSULTATION DATE:  02/21/2021 REFERRING MD:  Dr. Blaine Hamper, CHIEF COMPLAINT:  Syncope and fall   Brief Pt Description / Synopsis:  75 y.o. Male admitted with right subdural hematoma s/p syncopal episode and fall.  Later in the day on 11/7 with increased restlessness/agitation, and repeat CTH showed significant increase in size of right subdural hematoma.  Required intubation for airway protection. Plan for transfer to [facility].  History of Present Illness:  Devion Chriscoe is a 75 year old male with a past medical history significant for CHF with EF of 30 to 35%, hypertension, hyperlipidemia, stroke, GERD, COPD, tobacco abuse, GERD, PVD, prostate cancer, CAD, AAA who presented status post syncopal episode and fall.  Patient was here in the ED visiting his wife who is the patient, and staff suddenly heard the wife screaming from the room.  When staff entered the room they found the patient unconscious on the floor with epistaxis and with urinary incontinence.  No seizure activity was noted.  The episode lasted for approximately 1 minute, and upon arrival saw he was confused for several minutes after regaining consciousness.  Upon examination in the ED he was noted to be mildly lethargic, but alert and oriented x3, without focal deficits, and without slurred speech or facial droop.  He denied chest pain, shortness of breath, abdominal pain, nausea, vomiting, diarrhea, dysuria.  ED Course:  Initial Vital Signs:, temperature normal, heart rate 82, 114, RR 24, oxygen saturation 94% on room air Significant Labs: WBC 12.9, troponin level 21, 17, positive urinalysis (hazy appearance, moderate amount of leukocyte, positive nitrite, rare bacteria, WBC 11-20) Imaging: Chest x-ray showed cardiomegaly and vascular congestion, no overt edema CT-head: 1. Right cerebral convexity acute subdural hematoma measuring up to 6 mm  in thickness at the vertex and up to 4.3 mm thickness along the right posterior falx. 2. 3 mm right to left midline shift and slight crowding of the right frontoparietal gyri, but no downward mass effect or significant ventricular effacement. The third ventricle and sylvian aqueduct remain patent. 3. Atrophy, small vessel disease and gangliocapsular lacunar infarcts with chronic appearance and small chronic wedge-shaped left cerebellar infarct. 4. Hemorrhagic fluid levels in the bilateral sphenoid and right posterior ethmoid sinus. Sinus membrane disease. The sinus walls are grossly intact. 5. Nondisplaced fracture of left zygomatic arch. 6. Results phoned to Dr. Alfred Levins at 5:44 a.m., 02/21/2021, with read back. CT-Cervical Spine:  1. No acute traumatic injury identified in the cervical spine. 2. Nondisplaced fracture of the left zygomatic arch.  He was evaluated by Neurosurgery, and recommendation was made for him to be admitted to ICU at [facility] for frequent neuro checks, repeat imaging in 6 hours, and Nicardipine drip to maintain SBP <160.  Hospitalist were contacted to admit with PCCM consultation.  Upon initial PCCM evaluation, pt was awake and alert with no change in baseline neuro exam and no focal deficits.    Hospital Course: While awaiting bed placement in the ED, he was noted to become progressively more restless and agitated.  Repeat CT Head showed progression of the right subdural hematoma in size with increased right to left shift (9 mm).  Neurosurgery recommends to transfer to [facility].  Pt was intubated for airway protection.  Pertinent  Medical History  HFrEF (LVEF 30 to 35%) Hypertension Hyperlipidemia Stroke AAA PVD COPD Tobacco abuse GERD Barrett's esophagitis Prostate cancer   Micro Data:  02/22/2024:  SARS-CoV-2 and influenza PCR>> negative 02/21/2021: Urine>> 02/21/2021: Blood culture x2>>  Antimicrobials:  Ceftriaxone 11/7>>  Significant Hospital  Events: Including procedures, antibiotic start and stop dates in addition to other pertinent events   02/21/2021: Patient with syncopal episode and fall while visiting his wife in the ER.  Found to have right subdural hematoma.  Neurosurgery consulted 02/21/2021: Patient became progressively more restless and agitated, follow-up CT head with worsening of subdural hematoma.  Intubated for airway protection.  Plans for transfer to [place]  Interim History / Subjective:  -Upon initial PCCM evaluation this morning patient was at baseline mental status -Plans were to admit to [facility] ICU for close neuro assessments and nicardipine infusion -While awaiting bed placement in the ED patient's but became increasingly restless and agitated -Follow-up CT with worsening of subdural hematoma -Required intubation for airway protection -Plan for transfer to [place]  Objective   Blood pressure (!) 146/91, pulse 76, temperature 98.9 F (37.2 C), temperature source Oral, resp. rate (!) 22, height 6' (1.829 m), weight 77.3 kg, SpO2 94 %.       No intake or output data in the 24 hours ending 02/21/21 1140 Filed Weights   02/21/21 0900  Weight: 77.3 kg    Examination: General: Acutely ill-appearing male, sitting in bed, currently getting echocardiogram, on nasal cannula, no acute distress HENT: Normocephalic, neck supple, no JVD Lungs: Coarse breath sounds bilaterally, even, nonlabored Cardiovascular: Regular rate and rhythm, S1-S2, no murmurs, rubs, gallops Abdomen: Soft, nontender, nondistended, no guarding rebound tenderness, bowel sounds positive x4 Extremities: No deformities, no edema, normal bulk and tone Neuro: Awake, oriented x3, follows commands, no focal deficits, speech clear GU: Deferred  Resolved Hospital Problem list     Assessment & Plan:   Intubated for airway protection due to worsening subdural hematoma PMHx of COPD, tobacco abuse -Full vent support, implement lung protective  strategies -Plateau pressures less than 30 cm H20 -Wean FiO2 & PEEP as tolerated to maintain O2 sats >92% -Follow intermittent Chest X-ray & ABG as needed -Spontaneous Breathing Trials when respiratory parameters met and mental status permits -Implement VAP Bundle -Bronchodilators  Right Subdural Hematoma Sedation needs in setting of mechanical ventilation PMHx of Stroke -Maintain a RASS goal of 0 to -1 -Propofol and Fentanyl to maintain RASS goal -Avoid sedating medications as able -Daily wake up assessment -Frequent neuro exams -Repeat CTH for any change in neuro exam -Neurosurgery following, appreciate input -Plan for transfer to [place], may require intervention -Maintain SBP <160 -Hold antiplatelet/anticoagulation for 7 days per Neurosurgery recommendations  Hypertensive Urgency Syncope w/ Fall Chronic combined systolic and diastolic CHF without acute exacerbation Mildly elevated troponin, suspect demand ischemia PMHx of HTN, HLD, AAA, PVD -Continuous cardiac monitoring -Maintain SBP <160 -Nicardipine gtt as needed -Trend HS Troponin until peaked (21 ~ 17 ~ 21) -Echocardiogram pending -Continue home Coreg, Entresto, Spironolactone -Diuresis as BP and renal function permits  Urinary Tract Infection -Monitor fever curve -Trend WBC's & Procalcitonin -Follow cultures as above -Continue empiric Ceftriaxone pending cultures & sensitivities      Best Practice (right click and "Reselect all SmartList Selections" daily)   Diet/type: NPO DVT prophylaxis: SCD (chemical prophylaxis contraindicated) GI prophylaxis: PPI Lines: N/A Foley:  N/A Code Status:  Limited code Last date of multidisciplinary goals of care discussion [N/A]  Pt's sons updated at bedside by both myself and Dr. Gonzalez.  Labs   CBC: Recent Labs  Lab 02/21/21 0503  WBC 12.9*  NEUTROABS 9.7*  HGB   16.1  HCT 47.9  MCV 95.4  PLT 156    Basic Metabolic Panel: Recent Labs  Lab  02/21/21 0503  NA 140  K 4.2  CL 104  CO2 28  GLUCOSE 108*  BUN 18  CREATININE 0.96  CALCIUM 8.9   GFR: Estimated Creatinine Clearance: 70.5 mL/min (by C-G formula based on SCr of 0.96 mg/dL). Recent Labs  Lab 02/21/21 0503 02/21/21 1050  WBC 12.9*  --   LATICACIDVEN  --  1.0    Liver Function Tests: Recent Labs  Lab 02/21/21 0503  AST 18  ALT 18  ALKPHOS 84  BILITOT 1.9*  PROT 7.0  ALBUMIN 3.8   No results for input(s): LIPASE, AMYLASE in the last 168 hours. No results for input(s): AMMONIA in the last 168 hours.  ABG No results found for: PHART, PCO2ART, PO2ART, HCO3, TCO2, ACIDBASEDEF, O2SAT   Coagulation Profile: Recent Labs  Lab 02/21/21 0503  INR 1.1    Cardiac Enzymes: No results for input(s): CKTOTAL, CKMB, CKMBINDEX, TROPONINI in the last 168 hours.  HbA1C: No results found for: HGBA1C  CBG: Recent Labs  Lab 02/21/21 0943  GLUCAP 115*    Review of Systems:   Positives in BOLD: Gen: Denies fever, chills, weight change, fatigue, night sweats HEENT: Denies blurred vision, double vision, hearing loss, tinnitus, sinus congestion, rhinorrhea, sore throat, neck stiffness, dysphagia PULM: Denies shortness of breath, cough, sputum production, hemoptysis, wheezing CV: Denies chest pain, edema, orthopnea, paroxysmal nocturnal dyspnea, palpitations GI: Denies abdominal pain, nausea, vomiting, diarrhea, hematochezia, melena, constipation, change in bowel habits GU: Denies dysuria, hematuria, polyuria, oliguria, urethral discharge Endocrine: Denies hot or cold intolerance, polyuria, polyphagia or appetite change Derm: Denies rash, dry skin, scaling or peeling skin change Heme: Denies easy bruising, bleeding, bleeding gums Neuro: Denies headache, numbness, weakness, slurred speech, loss consciousness, syncope   Past Medical History:  He,  has a past medical history of AAA (abdominal aortic aneurysm), Abnormal nuclear stress test, COPD (chronic  obstructive pulmonary disease) (HCC), Coronary artery disease, GERD (gastroesophageal reflux disease), History of kidney stones, History of prostate cancer, Hyperlipidemia, Hypertension, Myocardial infarction (HCC), Neuromuscular disorder (HCC), Prostate cancer (HCC), PVD (peripheral vascular disease) (HCC), and Stroke (HCC).   Surgical History:   Past Surgical History:  Procedure Laterality Date   BACK SURGERY  2009/2010   x 2   CARDIAC CATHETERIZATION  2002   stent placement    ENDOVASCULAR REPAIR/STENT GRAFT N/A 01/02/2018   Procedure: ENDOVASCULAR REPAIR/STENT GRAFT;  Surgeon: Dew, Jason S, MD;  Location: [facility] INVASIVE CV LAB;  Service: Cardiovascular;  Laterality: N/A;   FOOT SURGERY     bilateral    HERNIA REPAIR     bilateral    KNEE SURGERY     right knee    PROSTATE SURGERY  09/2009   prostate implant   SPINE SURGERY       Social History:   reports that he has been smoking cigarettes. He has a 14.00 pack-year smoking history. He has never used smokeless tobacco. He reports that he does not drink alcohol and does not use drugs.   Family History:  His family history includes Diabetes in his mother; Heart attack in his mother; Heart disease in his father; Lung cancer in his daughter.   Allergies Allergies  Allergen Reactions   Lyrica [Pregabalin]     Swelling    Prednisone     Feels like having a heart attack. Has had the injection form before and does well.       Home Medications  Prior to Admission medications   Medication Sig Start Date End Date Taking? Authorizing Provider  albuterol (PROVENTIL HFA;VENTOLIN HFA) 108 (90 Base) MCG/ACT inhaler Inhale 2 puffs into the lungs every 6 (six) hours as needed for wheezing. 03/19/17 02/21/21 Yes Juanito Doom, MD  aspirin 81 MG tablet Take 81 mg by mouth daily.   Yes [provider]  atorvastatin (LIPITOR) 80 MG tablet Take 1 tablet (80 mg total) by mouth daily. 01/19/21  Yes Minna Merritts, MD   budesonide-formoterol (SYMBICORT) 160-4.5 MCG/ACT inhaler Inhale 2 puffs into the lungs 2 (two) times daily. 12/12/18  Yes Juanito Doom, MD  carvedilol (COREG) 3.125 MG tablet Take 1 tablet (3.125 mg total) by mouth 2 (two) times daily. 01/19/21  Yes Minna Merritts, MD  Cholecalciferol (VITAMIN D3) 2000 units capsule Takes on occassion   Yes [provider]  montelukast (SINGULAIR) 10 MG tablet Take 10 mg by mouth daily. 02/16/20 02/21/21 Yes [provider]  Multiple Vitamins-Minerals (MULTIVITAMIN WITH MINERALS) tablet Takes occassionally   Yes [provider]  omeprazole (PRILOSEC) 40 MG capsule TAKE 1 CAPSULE BY MOUTH DAILY USUALLY 30MINUTES BEFORE BREAKFAST 01/28/19  Yes Crecencio Mc, MD  sacubitril-valsartan (ENTRESTO) 97-103 MG Take 1 tablet by mouth 2 (two) times daily. 01/19/21  Yes Minna Merritts, MD  spironolactone (ALDACTONE) 25 MG tablet Take 0.5 tablets (12.5 mg total) by mouth daily. 01/19/21 07/18/21 Yes Gollan, Kathlene November, MD  polyethylene glycol powder (GLYCOLAX/MIRALAX) powder Take 17 g by mouth as needed.  04/24/11   [provider]     Critical care time: 50 minutes     Darel Hong, AGACNP-BC [facility] [place] epic messenger for cross cover needs If after hours, please call E-link

## 2021-02-21 NOTE — Progress Notes (Signed)
EEG complete - results pending 

## 2021-02-21 NOTE — Discharge Summary (Addendum)
Physician Discharge Summary  [name] JJO:[id number] DOB: 19-Apr-1945 DOA: 02/21/2021  PCP: Rusty Aus, MD  Admit date: 02/21/2021 Discharge date: 02/21/2021  Recommendations for Outpatient Follow-up:  -Will be transferred to [facility] neuro ICU  Home Health: none Equipment/Devices: none  Discharge Condition: will be intubategd CODE STATUS: partial code (OK for CPR, no intubation) Diet recommendation: NPO now  Brief/Interim Summary (HPI)  [name] is a 75 y.o. male with medical history significant of CHF with EF 30-35%, hypertension, hyperlipidemia, stroke, GERD, COPD, GERD, PVD, prostate cancer (s/p of XTR seeding), CAD, AAA, tobacco abuse, Barrett's esophagus, who presents with syncope and fall.   Per EDP, patient was here with his wife who is a patient in the emergency room. Staff suddenly heard wife screaming from the room. They at that  immediately walked in the room and found patient unconscious on the floor with epistaxis. Pt had urinary incontinence.  No seizure activity noted.  Episode lasted for about 1 minutes.  Patient seemed confused for several minutes after regaining consciousness. When I saw pt in ED, he is alert, mildly lethargic, but oriented x3.  He moves all extremities normally.  No facial droop or slurred speech.  He denies headache or neck pain to me.  No chest pain or shortness breath.  He states that he has chronic cough due to COPD, which has not changed.  No nausea, vomiting, diarrhea or abdominal pain.  No symptoms of UTI.  Per his wife, patient has dysuria and increased urinary frequency in the past several days.   Patient was found to have elevated blood pressure 233/141, nicardipine drip is started in the ED.     ED Course: pt was found to have WBC 12.9, troponin level 21, 17, positive urinalysis (hazy appearance, moderate amount of leukocyte, positive nitrite, rare bacteria, WBC 11-20), temperature normal, heart rate 82, 114, RR 24, oxygen  saturation 94% on room air.  Chest x-ray showed cardiomegaly and vascular congestion. Pt is admitted to stepdown as inpatient.  Neurosurgeon, Dr. Cari Caraway and Dr. Lacinda Axon are consulted.   First CT-head: 1. Right cerebral convexity acute subdural hematoma measuring up to 6 mm in thickness at the vertex and up to 4.3 mm thickness along the right posterior falx. 2. 3 mm right to left midline shift and slight crowding of the right frontoparietal gyri, but no downward mass effect or significant ventricular effacement. The third ventricle and sylvian aqueduct remain patent. 3. Atrophy, small vessel disease and gangliocapsular lacunar infarcts with chronic appearance and small chronic wedge-shaped left cerebellar infarct. 4. Hemorrhagic fluid levels in the bilateral sphenoid and right posterior ethmoid sinus. Sinus membrane disease. The sinus walls are grossly intact. 5. Nondisplaced fracture of left zygomatic arch. 6. Results phoned to Dr. Alfred Levins at 5:44 a.m., 02/21/2021, with read back.   CT-spin  1. No acute traumatic injury identified in the cervical spine. 2. Nondisplaced fracture of the left zygomatic arch.    The Repeated CT-head at 11:46 AM 1. Significant increase in size of the previously demonstrated right subdural hematoma with increased right to left shift, currently shifted 9 mm. 2. Interval 2.9 cm right temporal lobe parenchymal hemorrhage.    Subjective  -fall and head injury    Discharge Diagnoses and Hospital Course:   Principal Problem:   SDH (subdural hematoma) Active Problems:   Hyperlipidemia   CAD, NATIVE VESSEL   GERD   Tobacco abuse   COPD (chronic obstructive pulmonary disease) (HCC)   Syncope  Fall   Hypertensive urgency   HTN (hypertension)   Stroke (HCC)   Chronic combined systolic and diastolic CHF (congestive heart failure) (HCC)   Elevated troponin   Zygomatic arch fracture (HCC)   UTI (urinary tract infection)   Sepsis (HCC)    SDH  (subdural hematoma): CT scan showed acute SDH with 3 mm right to left midline shift, but no downward mass effect or significant ventricular effacement.  Dr. Cari Caraway and Dr. Lacinda Axon of neurosurgery are consulted.  They recommend to repeat the CT of head in 6 hours.  Patient has hypertensive urgency with blood pressure 233/141, nicardipine drip is started, targeting SBP <160.  As per Dr. Jonathon Jordan recommendation, we repeated CT scan at about 1146, which showed worsening SDH, new 2.9 cm right temporal lobe parenchymal hemorrhag with worsening midline shift. Consulted Dr. Lacinda Axon of neurosurgeon again, who recommended to transfer patient to another facility. I asked Dr. Lacinda Axon if he can help to communicate with neurosurgeon in other facility, unfortunately he was too busy to help. Later on, Dr.Yarbrough told me that the reason for transferring pt to other facility is that pt needs dedicated neuro ICU. I consulted PCCM, Dr. Tacy Learn and Dr. Christella Noa of neurosurgery who kindly accepted the patient to [facility] ICU.  Since patient has worsening mental statue. I consulted Dr. Patsey Berthold of ICU, patient is intubated. Of note, Dr. Lacinda Axon recommended to transfuse 1 units of platelet which was not done since pt needs urgent transfer and no enough time to do transfusion.   -Continue nicardipine drip -Frequent neuro check -Hold aspirin -transfer to [facility] ICU.   Zygomatic arch fracture: CT showed nondisplaced fracture of left zygomatic arch and hemorrhagic fluid levels in the bilateral sphenoid and right posterior ethmoid sinus.  -consulted Dr. Kathyrn Sheriff of ENT -Prn percocet and tylenol for pain   Syncope and fall: Etiology is not clear.  No seizure activity noted.  On physical examination patient does not have focal neurological findings, low suspicions for stroke.  Patient has low EF of 30 to 35%, suspecting cardiac etiology.  UTI may have contributed partially. -Fall precaution for precaution -Orthostatic status check -2D  echo -Telemetry monitor -Consulted Dr. Sophronia Simas of cardiology   Hypertensive urgency and HTN (hypertension): -Continued Coreg -Patient is on Entresto and spironolactone which is for CHF -Continued Nicardipin drip, targeting SBP <160   Hyperlipidemia -Lipitor   CAD, NATIVE VESSEL and elevated troponin: Troponin is minimally elevated 21, 17, 21.  No chest pain, likely demand ischemia -Hold aspirin due to subdural hematoma -Continued Lipitor, Coreg -Check A1c, FLP   GERD -Protonix   Tobacco abuse -Nicotine patch   COPD (chronic obstructive pulmonary disease) ([place]): No wheezing or rhonchi -Bronchodilators   Stroke (HCC) -Hold aspirin -Continued Lipitor   Chronic combined systolic and diastolic CHF (congestive heart failure) ([place]): 2D echo on 06/24/2020 showed EF 30-35% with grade 1 diastolic dysfunction.  Patient does not have leg edema.  No shortness of breath, CHF seem to be compensated. -Continued home spironolactone, Entresto   Sepsis due to UTI (urinary tract infection): Patient meets criteria for sepsis with WBC 12.9, tachycardia with heart rate up to 114, RR 24.  Pending lactic acid level -IV Rocephin -Follow-up of blood culture and urine culture -will get Procalcitonin and trend lactic acid levels per sepsis protocol. -IVF: due to EF of 30-35% , will not give IV fluid unless lactic acid is significantly elevated    Discharge Instructions   Allergies as of 02/21/2021  Reactions   Lyrica [pregabalin]    Swelling   Prednisone    Feels like having a heart attack. Has had the injection form before and does well.     Med Rec must be completed prior to using this SMART[employer]       Allergies  Allergen Reactions   Lyrica [Pregabalin]     Swelling    Prednisone     Feels like having a heart attack. Has had the injection form before and does well.    Consultations: Dr. Patsey Berthold of ICU, Dr. Tacy Learn of PCCM in [facility], Dr. Christella Noa for neurosurgery of [facility]   Procedures/Studies: [place] (5MM)  Result Date: 02/21/2021 CLINICAL DATA:  Follow-up subdural hematoma. EXAM: CT HEAD WITHOUT CONTRAST TECHNIQUE: Contiguous axial images were obtained from the base of the skull through the vertex without intravenous contrast. COMPARISON:  Earlier today. FINDINGS: Brain: Significant increase in size of the previously demonstrated subdural hematoma with inferior extension along the tentorium with a maximum thickness of 1.8 cm. There is also an interval parenchymal hemorrhage in the right temporal lobe, measuring 2.9 x 2.5 x 2.2 cm. Increased right to left shift, measuring 9 mm. Associated compression of the right lateral ventricle and effacement of the sulci. Vascular: No hyperdense vessel or unexpected calcification. Skull: Normal. Negative for fracture or focal lesion. Sinuses/Orbits: Unremarkable. Other: None. IMPRESSION: 1. Significant increase in size of the previously demonstrated right subdural hematoma with increased right to left shift, currently shifted 9 mm. 2. Interval 2.9 cm right temporal lobe parenchymal hemorrhage. Critical Value/emergent results were called by telephone at the time of interpretation on 02/21/2021 at 11:55 am to provider Ivor Costa , who verbally acknowledged these results. Electronically Signed   By: Claudie Revering M.D.   On: 02/21/2021 12:02   CT HEAD WO CONTRAST (5MM)  Result Date: 02/21/2021 CLINICAL DATA:  Head trauma. EXAM: CT HEAD WITHOUT CONTRAST TECHNIQUE: Contiguous axial images were obtained from the base of the skull through the vertex without intravenous contrast. COMPARISON:  None. FINDINGS: Brain: There is a right cerebral convexity acute subdural hematoma, for the most part measuring 3-4 mm with a maximal diameter up to 6 mm along the frontoparietal vertex. Some of the blood layers along the right posterior falx where it measures up to 4.4 mm in thickness, and there is 3 mm of right-to-left midline shift at  the level of the roof of the third ventricle, and slight crowding of the right cerebral gyri in frontoparietal area. There are mild features of atrophy and small vessel disease. There are bilateral gangliocapsular lacunar infarcts with chronic appearance with no old territorial infarct. There is a small wedge-shaped chronic infarct in the inferior left cerebellar hemisphere, with the brainstem and cerebellum otherwise unremarkable. There is a normal ventricular size. Vascular: There patchy calcifications in the carotid siphons, distal vertebral arteries. There are no hyperdense central vessels. Skull: The calvarium, skull base and orbits are intact. There are no focal bone lesions. There is a small left temporal scalp hematoma. There is a nondisplaced fracture of left zygomatic arch. Sinuses/Orbits: Unremarkable orbital contents. There are dense fluid levels in the bilateral sphenoid sinus and posterior right ethmoid sinus most likely due to hemorrhagic fluid levels, but the sinus walls appear intact. There is mild membrane thickening in maxillary and ethmoid air cells. No mastoid effusion is seen. Other: None. IMPRESSION: 1. Right cerebral convexity acute subdural hematoma measuring up to 6 mm in thickness at the vertex and  up to 4.3 mm thickness along the right posterior falx. 2. 3 mm right to left midline shift and slight crowding of the right frontoparietal gyri, but no downward mass effect or significant ventricular effacement. The third ventricle and sylvian aqueduct remain patent. 3. Atrophy, small vessel disease and gangliocapsular lacunar infarcts with chronic appearance and small chronic wedge-shaped left cerebellar infarct. 4. Hemorrhagic fluid levels in the bilateral sphenoid and right posterior ethmoid sinus. Sinus membrane disease. The sinus walls are grossly intact. 5. Nondisplaced fracture of left zygomatic arch. 6. Results phoned to Dr. Alfred Levins at 5:44 a.m., 02/21/2021, with read back.  Electronically Signed   By: Telford Nab M.D.   On: 02/21/2021 05:53   CT Cervical Spine Wo Contrast  Result Date: 02/21/2021 CLINICAL DATA:  75 year old male found down. EXAM: CT CERVICAL SPINE WITHOUT CONTRAST TECHNIQUE: Multidetector CT imaging of the cervical spine was performed without intravenous contrast. Multiplanar CT image reconstructions were also generated. COMPARISON:  None. FINDINGS: Alignment: Preserved cervical lordosis. Mild dextroconvex cervical curvature. Cervicothoracic junction alignment is within normal limits. Bilateral posterior element alignment is within normal limits. Skull base and vertebrae: Visualized skull base is intact. No atlanto-occipital dissociation. C1 and C2 appear intact and aligned. No acute osseous abnormality identified in the cervical spine. Soft tissues and spinal canal: No prevertebral fluid or swelling. No visible canal hematoma. Calcified bilateral cervical carotid atherosclerosis. Otherwise negative visible noncontrast neck soft tissues. Disc levels: Multilevel advanced left side cervical facet arthropathy, but mild for age disc and endplate degeneration. No significant spinal stenosis suspected. Upper chest: Visible upper thoracic levels appear intact. Negative lung apices. Other: Absent dentition. Nondisplaced comminution of the left zygomatic arch on series 2, image 6. Head CT today reported separately. IMPRESSION: 1. No acute traumatic injury identified in the cervical spine. 2. Nondisplaced fracture of the left zygomatic arch. Electronically Signed   By: Genevie Ann M.D.   On: 02/21/2021 06:01   DG Chest Portable 1 View  Result Date: 02/21/2021 CLINICAL DATA:  Syncope, urinary incontinence EXAM: PORTABLE CHEST 1 VIEW COMPARISON:  Prior chest x-ray 03/19/2017 FINDINGS: Cardiomegaly, significantly progressed compared to prior. Atherosclerotic calcifications present in the thoracic aorta. The aorta is tortuous. New pulmonary vascular congestion without overt  edema. No focal airspace infiltrate. No pleural effusion or pneumothorax. No acute osseous abnormality. IMPRESSION: Cardiomegaly and pulmonary vascular congestion without overt edema. Aortic atherosclerotic calcifications. Electronically Signed   By: Jacqulynn Cadet M.D.   On: 02/21/2021 05:35   ECHOCARDIOGRAM COMPLETE  Result Date: 02/21/2021    ECHOCARDIOGRAM REPORT   Patient Name:   [name] Date of Exam: 02/21/2021 Medical Rec #:  [id number]      Height:       72.0 in Accession #:    [id number]     Weight:       170.4 lb Date of Birth:  February 28, 1946      BSA:          1.990 m Patient Age:    75 years       BP:           146/91 mmHg Patient Gender: M              HR:           71 bpm. Exam Location:  [facility] Procedure: 2D Echo, Color Doppler and Cardiac Doppler Indications:     R55 Syncope  History:         Patient has prior history of Echocardiogram examinations,  most                  recent 06/24/2020. CAD, COPD, PVD and Stroke; Risk                  Factors:Hypertension and Dyslipidemia.  Sonographer:     Charmayne Sheer Referring Phys:  [id number] Gerlach Mel Tadros Diagnosing Phys: Kate Sable MD  Sonographer Comments: Suboptimal parasternal window. Image acquisition challenging due to uncooperative patient and Image acquisition challenging due to COPD. IMPRESSIONS  1. Left ventricular ejection fraction, by estimation, is 40 to 45%. Left ventricular ejection fraction by 2D MOD biplane is 44.2 %. The left ventricle has mild to moderately decreased function. The left ventricle demonstrates global hypokinesis. There is mild left ventricular hypertrophy of the basal-septal segment. Left ventricular diastolic parameters are consistent with Grade I diastolic dysfunction (impaired relaxation).  2. Right ventricular systolic function is normal. The right ventricular size is normal.  3. The mitral valve is normal in structure. No evidence of mitral valve regurgitation.  4. The aortic valve was not well visualized. Aortic valve  regurgitation is not visualized.  5. The inferior vena cava is normal in size with <50% respiratory variability, suggesting right atrial pressure of 8 mmHg. FINDINGS  Left Ventricle: Left ventricular ejection fraction, by estimation, is 40 to 45%. Left ventricular ejection fraction by 2D MOD biplane is 44.2 %. The left ventricle has mild to moderately decreased function. The left ventricle demonstrates global hypokinesis. The left ventricular internal cavity size was normal in size. There is mild left ventricular hypertrophy of the basal-septal segment. Left ventricular diastolic parameters are consistent with Grade I diastolic dysfunction (impaired relaxation). Right Ventricle: The right ventricular size is normal. No increase in right ventricular wall thickness. Right ventricular systolic function is normal. Left Atrium: Left atrial size was normal in size. Right Atrium: Right atrial size was normal in size. Pericardium: There is no evidence of pericardial effusion. Mitral Valve: The mitral valve is normal in structure. No evidence of mitral valve regurgitation. MV peak gradient, 6.7 mmHg. The mean mitral valve gradient is 2.0 mmHg. Tricuspid Valve: The tricuspid valve is normal in structure. Tricuspid valve regurgitation is not demonstrated. Aortic Valve: The aortic valve was not well visualized. Aortic valve regurgitation is not visualized. Aortic valve mean gradient measures 6.0 mmHg. Aortic valve peak gradient measures 9.9 mmHg. Aortic valve area, by VTI measures 3.41 cm. Pulmonic Valve: The pulmonic valve was not well visualized. Pulmonic valve regurgitation is not visualized. Aorta: The aortic root was not well visualized. Venous: The inferior vena cava is normal in size with less than 50% respiratory variability, suggesting right atrial pressure of 8 mmHg. IAS/Shunts: No atrial level shunt detected by color flow Doppler.  LEFT VENTRICLE PLAX 2D                        Biplane EF (MOD) LVIDd:         5.30 cm          LV Biplane EF:   Left LVIDs:         4.20 cm                          ventricular LV PW:         1.30 cm                          ejection  LV IVS:        1.00 cm                          fraction by LVOT diam:     2.60 cm                          2D MOD LV SV:         105                              biplane is LV SV Index:   53                               44.2 %. LVOT Area:     5.31 cm                                Diastology                                LV e' medial:    4.79 cm/s LV Volumes (MOD)               LV E/e' medial:  11.8 LV vol d, MOD    193.0 ml      LV e' lateral:   5.22 cm/s A2C:                           LV E/e' lateral: 10.8 LV vol d, MOD    162.0 ml A4C: LV vol s, MOD    108.0 ml A2C: LV vol s, MOD    95.2 ml A4C: LV SV MOD A2C:   85.0 ml LV SV MOD A4C:   162.0 ml LV SV MOD BP:    84.0 ml LEFT ATRIUM             Index LA diam:        4.80 cm 2.41 cm/m LA Vol (A2C):   55.9 ml 28.08 ml/m LA Vol (A4C):   40.1 ml 20.15 ml/m LA Biplane Vol: 49.0 ml 24.62 ml/m  AORTIC VALVE AV Area (Vmax):    3.38 cm AV Area (Vmean):   3.38 cm AV Area (VTI):     3.41 cm AV Vmax:           157.00 cm/s AV Vmean:          112.000 cm/s AV VTI:            0.308 m AV Peak Grad:      9.9 mmHg AV Mean Grad:      6.0 mmHg LVOT Vmax:         99.90 cm/s LVOT Vmean:        71.300 cm/s LVOT VTI:          0.198 m LVOT/AV VTI ratio: 0.64  AORTA Ao Root diam: 3.50 cm MITRAL VALVE MV Area (PHT): 4.17 cm     SHUNTS MV Area VTI:   3.74 cm     Systemic VTI:  0.20 m MV Peak grad:  6.7 mmHg     Systemic Diam: 2.60 cm MV Mean grad:  2.0 mmHg MV Vmax:  1.29 m/s MV Vmean:      65.5 cm/s MV Decel Time: 182 msec MV E velocity: 56.60 cm/s MV A velocity: 102.00 cm/s MV E/A ratio:  0.55 Kate Sable MD Electronically signed by Kate Sable MD Signature Date/Time: 02/21/2021/1:17:34 PM    Final       Discharge Exam: Vitals:   02/21/21 1300 02/21/21 1341  BP: 136/78 (!) 143/93  Pulse: 73 79  Resp: 16 15   Temp:    SpO2: 95% 96%   Vitals:   02/21/21 1110 02/21/21 1155 02/21/21 1300 02/21/21 1341  BP: (!) 146/91 137/88 136/78 (!) 143/93  Pulse: 76 (!) 113 73 79  Resp: (!) 22 20 16 15   Temp:      TempSrc:      SpO2: 94% 90% 95% 96%  Weight:      Height:        General: Not in acute distress HEENT:       Eyes: PERRL, EOMI, no scleral icterus.       ENT: No discharge from the ears and nose,       Neck: No JVD, no bruit, no mass felt. Heme: No neck lymph node enlargement. Cardiac: S1/S2, RRR, No murmurs, No gallops or rubs. Respiratory: No rales, wheezing, rhonchi or rubs. GI: Soft, nondistended, nontender, no organomegaly, BS present. GU: No hematuria Ext: No pitting leg edema bilaterally. 1+DP/PT pulse bilaterally. Musculoskeletal: No joint deformities, No joint redness or warmth, no limitation of ROM in spin. Skin: No rashes.  Neuro: Patient is confused, arousable, knows his own name, confused about time and place, partially following command, moves all extremities. Cranial nerves II-XII grossly intact, moves all extremities.  Psych: Patient is not psychotic, no suicidal or hemocidal ideation.     The results of significant diagnostics from this hospitalization (including imaging, microbiology, ancillary and laboratory) are listed below for reference.     Microbiology: Recent Results (from the past 240 hour(s))  Resp Panel by RT-PCR (Flu A&B, Covid) Nasopharyngeal Swab     Status: None   Collection Time: 02/21/21  5:03 AM   Specimen: Nasopharyngeal Swab; Nasopharyngeal(NP) swabs in vial transport medium  Result Value Ref Range Status   SARS Coronavirus 2 by RT PCR NEGATIVE NEGATIVE Final    Comment: (NOTE) SARS-CoV-2 target nucleic acids are NOT DETECTED.  The SARS-CoV-2 RNA is generally detectable in upper respiratory specimens during the acute phase of infection. The lowest concentration of SARS-CoV-2 viral copies this assay can detect is 138 copies/mL. A negative  result does not preclude SARS-Cov-2 infection and should not be used as the sole basis for treatment or other patient management decisions. A negative result may occur with  improper specimen collection/handling, submission of specimen other than nasopharyngeal swab, presence of viral mutation(s) within the areas targeted by this assay, and inadequate number of viral copies(<138 copies/mL). A negative result must be combined with clinical observations, patient history, and epidemiological information. The expected result is Negative.  Fact Sheet for Patients:  EntrepreneurPulse.com.au  Fact Sheet for Healthcare Providers:  IncredibleEmployment.be  This test is no t yet approved or cleared by the [place] FDA and  has been authorized for detection and/or diagnosis of SARS-CoV-2 by FDA under an Emergency Use Authorization (EUA). This EUA will remain  in effect (meaning this test can be used) for the duration of the COVID-19 declaration under Section 564(b)(1) of the Act, 21 U.S.C.section 360bbb-3(b)(1), unless the authorization is terminated  or revoked sooner.  Influenza A by PCR NEGATIVE NEGATIVE Final   Influenza B by PCR NEGATIVE NEGATIVE Final    Comment: (NOTE) The Xpert Xpress SARS-CoV-2/FLU/RSV plus assay is intended as an aid in the diagnosis of influenza from Nasopharyngeal swab specimens and should not be used as a sole basis for treatment. Nasal washings and aspirates are unacceptable for Xpert Xpress SARS-CoV-2/FLU/RSV testing.  Fact Sheet for Patients: EntrepreneurPulse.com.au  Fact Sheet for Healthcare Providers: IncredibleEmployment.be  This test is not yet approved or cleared by the [place] FDA and has been authorized for detection and/or diagnosis of SARS-CoV-2 by FDA under an Emergency Use Authorization (EUA). This EUA will remain in effect (meaning this test can be used)  for the duration of the COVID-19 declaration under Section 564(b)(1) of the Act, 21 U.S.C. section 360bbb-3(b)(1), unless the authorization is terminated or revoked.  Performed at [facility], [address] [place], [place] [address]   CULTURE, BLOOD (ROUTINE X 2) w Reflex to ID Panel     Status: None (Preliminary result)   Collection Time: 02/21/21  8:54 AM   Specimen: BLOOD  Result Value Ref Range Status   Specimen Description BLOOD BLOOD RIGHT HAND  Final   Special Requests   Final    BOTTLES DRAWN AEROBIC AND ANAEROBIC Blood Culture adequate volume   Culture   Final    NO GROWTH < 12 HOURS Performed at [facility], [address] [place], [place] [address]    Report Status PENDING  Incomplete  CULTURE, BLOOD (ROUTINE X 2) w Reflex to ID Panel     Status: None (Preliminary result)   Collection Time: 02/21/21  8:54 AM   Specimen: BLOOD  Result Value Ref Range Status   Specimen Description BLOOD BLOOD LEFT HAND  Final   Special Requests   Final    BOTTLES DRAWN AEROBIC AND ANAEROBIC Blood Culture adequate volume   Culture   Final    NO GROWTH < 12 HOURS Performed at [facility], [address] [place], [place] [address]    Report Status PENDING  Incomplete     Labs: BNP (last 3 results) Recent Labs    02/21/21 0503  BNP 2,130.8*   Basic Metabolic Panel: Recent Labs  Lab 02/21/21 0503  NA 140  K 4.2  CL 104  CO2 28  GLUCOSE 108*  BUN 18  CREATININE 0.96  CALCIUM 8.9   Liver Function Tests: Recent Labs  Lab 02/21/21 0503  AST 18  ALT 18  ALKPHOS 84  BILITOT 1.9*  PROT 7.0  ALBUMIN 3.8   No results for input(s): LIPASE, AMYLASE in the last 168 hours. No results for input(s): AMMONIA in the last 168 hours. CBC: Recent Labs  Lab 02/21/21 0503  WBC 12.9*  NEUTROABS 9.7*  HGB 16.1  HCT 47.9  MCV 95.4  PLT 156   Cardiac Enzymes: No results for input(s): CKTOTAL, CKMB, CKMBINDEX, TROPONINI in the last 168  hours. BNP: Invalid input(s): POCBNP CBG: Recent Labs  Lab 02/21/21 0443 02/21/21 0943  GLUCAP 101* 115*   D-Dimer No results for input(s): DDIMER in the last 72 hours. Hgb A1c Recent Labs    02/21/21 0854  HGBA1C 5.5   Lipid Profile No results for input(s): CHOL, HDL, LDLCALC, TRIG, CHOLHDL, LDLDIRECT in the last 72 hours. Thyroid function studies No results for input(s): TSH, T4TOTAL, T3FREE, THYROIDAB in the last 72 hours.  Invalid input(s): FREET3 Anemia work up No results for input(s): VITAMINB12, FOLATE, FERRITIN, TIBC,  IRON, RETICCTPCT in the last 72 hours. Urinalysis    Component Value Date/Time   COLORURINE YELLOW (A) 02/21/2021 0641   APPEARANCEUR HAZY (A) 02/21/2021 0641   APPEARANCEUR Cloudy 04/21/2011 1418   LABSPEC 1.021 02/21/2021 0641   LABSPEC 1.013 04/21/2011 1418   PHURINE 5.0 02/21/2021 0641   GLUCOSEU NEGATIVE 02/21/2021 0641   GLUCOSEU Negative 04/21/2011 1418   HGBUR MODERATE (A) 02/21/2021 0641   BILIRUBINUR NEGATIVE 02/21/2021 0641   BILIRUBINUR Negative 04/21/2011 1418   KETONESUR 5 (A) 02/21/2021 0641   PROTEINUR 30 (A) 02/21/2021 0641   NITRITE POSITIVE (A) 02/21/2021 0641   LEUKOCYTESUR MODERATE (A) 02/21/2021 0641   LEUKOCYTESUR Negative 04/21/2011 1418   Sepsis Labs Invalid input(s): PROCALCITONIN,  WBC,  LACTICIDVEN Microbiology Recent Results (from the past 240 hour(s))  Resp Panel by RT-PCR (Flu A&B, Covid) Nasopharyngeal Swab     Status: None   Collection Time: 02/21/21  5:03 AM   Specimen: Nasopharyngeal Swab; Nasopharyngeal(NP) swabs in vial transport medium  Result Value Ref Range Status   SARS Coronavirus 2 by RT PCR NEGATIVE NEGATIVE Final    Comment: (NOTE) SARS-CoV-2 target nucleic acids are NOT DETECTED.  The SARS-CoV-2 RNA is generally detectable in upper respiratory specimens during the acute phase of infection. The lowest concentration of SARS-CoV-2 viral copies this assay can detect is 138 copies/mL. A  negative result does not preclude SARS-Cov-2 infection and should not be used as the sole basis for treatment or other patient management decisions. A negative result may occur with  improper specimen collection/handling, submission of specimen other than nasopharyngeal swab, presence of viral mutation(s) within the areas targeted by this assay, and inadequate number of viral copies(<138 copies/mL). A negative result must be combined with clinical observations, patient history, and epidemiological information. The expected result is Negative.  Fact Sheet for Patients:  EntrepreneurPulse.com.au  Fact Sheet for Healthcare Providers:  IncredibleEmployment.be  This test is no t yet approved or cleared by the [place] FDA and  has been authorized for detection and/or diagnosis of SARS-CoV-2 by FDA under an Emergency Use Authorization (EUA). This EUA will remain  in effect (meaning this test can be used) for the duration of the COVID-19 declaration under Section 564(b)(1) of the Act, 21 U.S.C.section 360bbb-3(b)(1), unless the authorization is terminated  or revoked sooner.       Influenza A by PCR NEGATIVE NEGATIVE Final   Influenza B by PCR NEGATIVE NEGATIVE Final    Comment: (NOTE) The Xpert Xpress SARS-CoV-2/FLU/RSV plus assay is intended as an aid in the diagnosis of influenza from Nasopharyngeal swab specimens and should not be used as a sole basis for treatment. Nasal washings and aspirates are unacceptable for Xpert Xpress SARS-CoV-2/FLU/RSV testing.  Fact Sheet for Patients: EntrepreneurPulse.com.au  Fact Sheet for Healthcare Providers: IncredibleEmployment.be  This test is not yet approved or cleared by the [place] FDA and has been authorized for detection and/or diagnosis of SARS-CoV-2 by FDA under an Emergency Use Authorization (EUA). This EUA will remain in effect (meaning this test can  be used) for the duration of the COVID-19 declaration under Section 564(b)(1) of the Act, 21 U.S.C. section 360bbb-3(b)(1), unless the authorization is terminated or revoked.  Performed at [facility], [address] [place], [place] [address]   CULTURE, BLOOD (ROUTINE X 2) w Reflex to ID Panel     Status: None (Preliminary result)   Collection Time: 02/21/21  8:54 AM   Specimen: BLOOD  Result Value Ref Range Status  Specimen Description BLOOD BLOOD RIGHT HAND  Final   Special Requests   Final    BOTTLES DRAWN AEROBIC AND ANAEROBIC Blood Culture adequate volume   Culture   Final    NO GROWTH < 12 HOURS Performed at [facility], [address] [place], [place] [address]    Report Status PENDING  Incomplete  CULTURE, BLOOD (ROUTINE X 2) w Reflex to ID Panel     Status: None (Preliminary result)   Collection Time: 02/21/21  8:54 AM   Specimen: BLOOD  Result Value Ref Range Status   Specimen Description BLOOD BLOOD LEFT HAND  Final   Special Requests   Final    BOTTLES DRAWN AEROBIC AND ANAEROBIC Blood Culture adequate volume   Culture   Final    NO GROWTH < 12 HOURS Performed at [facility], [address] [place], [place] [address]    Report Status PENDING  Incomplete    Time coordinating discharge: 29 min  SIGNED:  Ivor Costa, MD [facility] 02/21/2021, 1:46 PM   If 7PM-7AM, please contact night-coverage [email or web address]

## 2021-02-21 NOTE — Consult Note (Addendum)
NAME:  [name], MRN:  [id number], DOB:  [birth year], LOS: 0 ADMISSION DATE:  02/21/2021, CONSULTATION DATE:  11/7 REFERRING MD:  Christella Noa, CHIEF COMPLAINT:  acute SDH   History of Present Illness:  75 year old male patient with history as below was at [facility] emergency room visiting his wife who is under evaluation there.  Suddenly nursing staff heard wife screaming in the room, on arrival they found the patient unconscious on the floor with epistaxis and urinary incontinence.  Per report he was unconscious for approximately 1 minute, following this he was confused for several minutes then improved.  On initial evaluation by critical care he was awake, oriented x3 without focal deficits and had no slurred speech or dysarthria.  His initial CT of head showed right subdural hematoma C-spine was normal.  While in the emergency room he became progressively more restless and agitated a repeat CT of head showed progression of right subdural hematoma with new right to left shift.  He was evaluated by neurosurgery at [facility] who reported they did not have capacity to assist him and therefore he was transferred to [facility] for neurosurgical evaluation.  He was intubated for airway protection and transferred to [facility] on arrival he was sedated on fentanyl and propofol infusion  Pertinent  Medical History  CHF w ef 30-35% HTN HL prior stroke tobacco abuse prostate cancer CAD AAA   Significant Hospital Events: Including procedures, antibiotic start and stop dates in addition to other pertinent events   11/7 admitted status post syncopal event/fall resulting in right subdural hematoma.  Mental status deteriorated while in ER, repeat CT imaging showed progression of right subdural hematoma transferred to [facility] for neurosurgical evaluation  Interim History / Subjective:  Currently sedated   Objective   Blood pressure (Abnormal) 145/96, pulse 62, resp. rate (Abnormal) 22,  SpO2 100 %.    Vent Mode: PRVC FiO2 (%):  [50 %] 50 % Set Rate:  [16 bmp-22 bmp] 22 bmp Vt Set:  [450 mL-480 mL] 480 mL PEEP:  [5 cmH20] 5 cmH20 Plateau Pressure:  [14 cmH20] 14 cmH20  No intake or output data in the 24 hours ending 02/21/21 1657 There were no vitals filed for this visit.  Examination: General: 75 year old male patient resting in bed, sedated.  Moving all extremities HENT: Pupil equal reactive no focal deficits orally intubated Lungs: Scattered rhonchi Cardiovascular: Regular rate and rhythm Abdomen: Soft not tender Extremities: Warm dry scattered areas of ecchymosis Neuro: Sedated on propofol and fentanyl.  Moves all extremities does not follow commands GU: Due to void  Resolved Hospital Problem list   Hypertensive urgency resolved  Assessment & Plan:  Traumatic right subdural hematoma status post syncopal event with CT imaging suggesting progression of bleed Plan Serial neuro checks Minimal sedation with RASS goal 0 to -1 Potential for surgical intervention TBD Systolic blood pressure goal less than 160  Acute respiratory failure  with ineffective airway clearance, superimposed on history of COPD and active tobacco abuse Plan Full vent support PAD protocol RASS goal 0 to -1 Scheduled bronchodilators VAP bundle A.m. chest x-ray  Syncope with fall, superimposed on history of combined systolic and diastolic heart failure Mildly elevated troponin secondary to probable demand ischemia Plan Telemetry monitoring Serial troponins Follow-up echocardiogram Will cont coreg for now   History of hypertension hyperlipidemia AAA and peripheral vascular disease Plan Holding antiplatelet agents  Urinary tract infection Plan Follow-up cultures Ceftriaxone day 1    Best Practice (  right click and "Reselect all SmartList Selections" daily)   Diet/type: NPO w/ meds via tube DVT prophylaxis: SCD GI prophylaxis: PPI Lines: N/A Foley:  N/A Code Status:   full code Last date of multidisciplinary goals of care discussion [pending ]  Labs   CBC: Recent Labs  Lab 02/21/21 0503  WBC 12.9*  NEUTROABS 9.7*  HGB 16.1  HCT 47.9  MCV 95.4  PLT 696    Basic Metabolic Panel: Recent Labs  Lab 02/21/21 0503  NA 140  K 4.2  CL 104  CO2 28  GLUCOSE 108*  BUN 18  CREATININE 0.96  CALCIUM 8.9   GFR: Estimated Creatinine Clearance: 70.5 mL/min (by C-G formula based on SCr of 0.96 mg/dL). Recent Labs  Lab 02/21/21 0503 02/21/21 1050  PROCALCITON  --  <0.10  WBC 12.9*  --   LATICACIDVEN  --  1.0    Liver Function Tests: Recent Labs  Lab 02/21/21 0503  AST 18  ALT 18  ALKPHOS 84  BILITOT 1.9*  PROT 7.0  ALBUMIN 3.8   No results for input(s): LIPASE, AMYLASE in the last 168 hours. No results for input(s): AMMONIA in the last 168 hours.  ABG    Component Value Date/Time   PHART 7.37 02/21/2021 1400   PCO2ART 48 02/21/2021 1400   PO2ART 109 (H) 02/21/2021 1400   HCO3 27.7 02/21/2021 1400   O2SAT 98.1 02/21/2021 1400     Coagulation Profile: Recent Labs  Lab 02/21/21 0503  INR 1.1    Cardiac Enzymes: No results for input(s): CKTOTAL, CKMB, CKMBINDEX, TROPONINI in the last 168 hours.  HbA1C: Hgb A1c MFr Bld  Date/Time Value Ref Range Status  02/21/2021 08:54 AM 5.5 4.8 - 5.6 % Final    Comment:    (NOTE) Pre diabetes:          5.7%-6.4%  Diabetes:              >6.4%  Glycemic control for   <7.0% adults with diabetes     CBG: Recent Labs  Lab 02/21/21 0443 02/21/21 0943  GLUCAP 101* 115*    Review of Systems:   Not able   Past Medical History:  He,  has a past medical history of AAA (abdominal aortic aneurysm), Abnormal nuclear stress test, COPD (chronic obstructive pulmonary disease) ([place]), Coronary artery disease, GERD (gastroesophageal reflux disease), History of kidney stones, History of prostate cancer, Hyperlipidemia, Hypertension, Myocardial infarction ([place]), Neuromuscular disorder  ([place]), Prostate cancer ([place]), PVD (peripheral vascular disease) ([place]), and Stroke ([place]).   Surgical History:   Past Surgical History:  Procedure Laterality Date   BACK SURGERY  2009/2010   x 2   CARDIAC CATHETERIZATION  2002   stent placement [place]   ENDOVASCULAR REPAIR/STENT GRAFT N/A 01/02/2018   Procedure: ENDOVASCULAR REPAIR/STENT GRAFT;  Surgeon: Algernon Huxley, MD;  Location: [facility];  Service: Cardiovascular;  Laterality: N/A;   FOOT SURGERY     bilateral    HERNIA REPAIR     bilateral    KNEE SURGERY     right knee    PROSTATE SURGERY  09/2009   prostate implant   SPINE SURGERY       Social History:   reports that he has been smoking cigarettes. He has a 14.00 pack-year smoking history. He has never used smokeless tobacco. He reports that he does not drink alcohol and does not use drugs.   Family History:  His family history includes Diabetes in his  mother; Heart attack in his mother; Heart disease in his father; Lung cancer in his daughter.   Allergies Allergies  Allergen Reactions   Lyrica [Pregabalin]     Swelling    Prednisone     Feels like having a heart attack. Has had the injection form before and does well.     Home Medications  Prior to Admission medications   Medication Sig Start Date End Date Taking? Authorizing Provider  albuterol (PROVENTIL HFA;VENTOLIN HFA) 108 (90 Base) MCG/ACT inhaler Inhale 2 puffs into the lungs every 6 (six) hours as needed for wheezing. 03/19/17 02/21/21  Juanito Doom, MD  aspirin 81 MG tablet Take 81 mg by mouth daily.    [provider]  atorvastatin (LIPITOR) 80 MG tablet Take 1 tablet (80 mg total) by mouth daily. 01/19/21   Minna Merritts, MD  budesonide-formoterol (SYMBICORT) 160-4.5 MCG/ACT inhaler Inhale 2 puffs into the lungs 2 (two) times daily. 12/12/18   Juanito Doom, MD  carvedilol (COREG) 3.125 MG tablet Take 1 tablet (3.125 mg total) by mouth 2 (two) times daily. 01/19/21    Minna Merritts, MD  Cholecalciferol (VITAMIN D3) 2000 units capsule Takes on occassion    [provider]  montelukast (SINGULAIR) 10 MG tablet Take 10 mg by mouth daily. 02/16/20 02/21/21  [provider]  Multiple Vitamins-Minerals (MULTIVITAMIN WITH MINERALS) tablet Takes occassionally    [provider]  omeprazole (PRILOSEC) 40 MG capsule TAKE 1 CAPSULE BY MOUTH DAILY USUALLY 30MINUTES BEFORE BREAKFAST 01/28/19   Crecencio Mc, MD  polyethylene glycol powder (GLYCOLAX/MIRALAX) powder Take 17 g by mouth as needed.  04/24/11   [provider]  sacubitril-valsartan (ENTRESTO) 97-103 MG Take 1 tablet by mouth 2 (two) times daily. 01/19/21   Minna Merritts, MD  spironolactone (ALDACTONE) 25 MG tablet Take 0.5 tablets (12.5 mg total) by mouth daily. 01/19/21 07/18/21  Minna Merritts, MD     Critical care time: 34 min      Erick Colace ACNP-BC [facility] Pager # [phone] OR # [phone] if no answer

## 2021-02-21 NOTE — ED Notes (Signed)
Patient transported to CT 

## 2021-02-21 NOTE — Consult Note (Signed)
Neurosurgery-New Consultation Evaluation 02/21/2021 [name] [id number]  Identifying Statement: [name] is a 75 y.o. male from [place] [address] with fall  Physician Requesting Consultation: [facility]  History of Present Illness: [name] is here for evaluation after a fall.  He states he does not remember this and does not member if he struck his head.  He was here seeing his wife and was found unconscious in the room with her.  He did not appear to have any seizure-like activity but he did have loss of consciousness for a minute.  He had some confusion after this but he gradually returned to his baseline.  He did get a CT scan of his head and was found to have a very small subdural hematoma.  Given this, we are consulted for evaluation.  He currently denies any weakness or numbness.  He does appear uncomfortable in the bed.  Past Medical History:  Past Medical History:  Diagnosis Date   AAA (abdominal aortic aneurysm)    a.  CTA 7/19: measured 4.5 x 5.3 cm and greatest transverse dimensions   Abnormal nuclear stress test    a.  Myoview 2012: anterior wall ischemia with an estimated EF of 26%.  This was a new wall motion abnormality as well as a newly reduced EF   COPD (chronic obstructive pulmonary disease) (HCC)    Coronary artery disease    a.  Posterior MI in 2002 status post BMS to the LCx; b. [place] 2012: 95% stenosis of the proximal LAD, 95% stenosis of the mid LAD, 30% in-stent restenosis of the mid left circumflex with a second lesion of diffuse 50% stenosis.  The patient underwent successful PCI/BMS to the mid LAD with 0% residual stenosis, LV gram not performed   GERD (gastroesophageal reflux disease)    History of kidney stones    History of prostate cancer    Hyperlipidemia    Hypertension    Myocardial infarction [facility])    Neuromuscular disorder ([place])    Prostate cancer ([place])    a.  Status post seeding   PVD (peripheral vascular disease) (HCC)     Stroke [facility])     Social History: Social History   Socioeconomic History   Marital status: Married    Spouse name: Not on file   Number of children: Not on file   Years of education: Not on file   Highest education level: Not on file  Occupational History   Not on file  Tobacco Use   Smoking status: Every Day    Packs/day: 0.25    Years: 56.00    Pack years: 14.00    Types: Cigarettes   Smokeless tobacco: Never   Tobacco comments:    back to smoking X1 year  Vaping Use   Vaping Use: Never used  Substance and Sexual Activity   Alcohol use: No    Alcohol/week: 0.0 standard drinks   Drug use: No   Sexual activity: Not on file  Other Topics Concern   Not on file  Social History Narrative   Not on file   Social Determinants of Health   Financial Resource Strain: Not on file  Food Insecurity: Not on file  Transportation Needs: Not on file  Physical Activity: Not on file  Stress: Not on file  Social Connections: Not on file  Intimate Partner Violence: Not on file    Family History: Family History  Problem Relation Age of Onset   Heart attack Mother  Diabetes Mother    Heart disease Father    Lung cancer Daughter     Review of Systems:  Review of Systems - General ROS: Negative Psychological ROS: Negative Ophthalmic ROS: Negative ENT ROS: Negative Hematological and Lymphatic ROS: Negative  Endocrine ROS: Negative Respiratory ROS: Negative Cardiovascular ROS: Negative Gastrointestinal ROS: Negative Genito-Urinary ROS: Negative Musculoskeletal ROS: Negative Neurological ROS: Positive for head injury Dermatological ROS: Negative  Physical Exam: BP 114/70   Pulse 76   Temp 98.9 F (37.2 C) (Oral)   Resp (!) 24   Ht 6' (1.829 m)   SpO2 95%   BMI 22.70 kg/m  Body mass index is 22.7 kg/m. Body surface area is 1.96 meters squared. General appearance: Alert, appears uncomfortable, sitting up in bed Head: Normocephalic Eyes: Normal, EOM  intact Oropharynx: Some dried blood around the nose Ext: No edema in LE bilaterally, warm extremities  Neurologic exam:  Mental status: alertness: alert, orientation: person, place, did not know month or year, affect: normal Speech: fluent and clear, names and repeats Cranial nerves:  III/IV/VI: extra-ocular motions intact bilaterally V/VII:no evidence of facial droop or weakness  VIII: hearing normal XI: trapezius strength symmetric,  sternocleidomastoid strength symmetric XII: tongue strength symmetric  Motor:strength symmetric 5/5, normal muscle mass and tone in all extremities  Sensory: intact to light touch in all extremities Gait: Not tested  Laboratory: Results for orders placed or performed during the hospital encounter of 02/21/21  CBC with Differential  Result Value Ref Range   WBC 12.9 (H) 4.0 - 10.5 K/uL   RBC 5.02 4.22 - 5.81 MIL/uL   Hemoglobin 16.1 13.0 - 17.0 g/dL   HCT 47.9 39.0 - 52.0 %   MCV 95.4 80.0 - 100.0 fL   MCH 32.1 26.0 - 34.0 pg   MCHC 33.6 30.0 - 36.0 g/dL   RDW 12.9 11.5 - 15.5 %   Platelets 156 150 - 400 K/uL   nRBC 0.0 0.0 - 0.2 %   Neutrophils Relative % 75 %   Neutro Abs 9.7 (H) 1.7 - 7.7 K/uL   Lymphocytes Relative 16 %   Lymphs Abs 2.0 0.7 - 4.0 K/uL   Monocytes Relative 6 %   Monocytes Absolute 0.8 0.1 - 1.0 K/uL   Eosinophils Relative 2 %   Eosinophils Absolute 0.3 0.0 - 0.5 K/uL   Basophils Relative 0 %   Basophils Absolute 0.0 0.0 - 0.1 K/uL   Immature Granulocytes 1 %   Abs Immature Granulocytes 0.07 0.00 - 0.07 K/uL  Comprehensive metabolic panel  Result Value Ref Range   Sodium 140 135 - 145 mmol/L   Potassium 4.2 3.5 - 5.1 mmol/L   Chloride 104 98 - 111 mmol/L   CO2 28 22 - 32 mmol/L   Glucose, Bld 108 (H) 70 - 99 mg/dL   BUN 18 8 - 23 mg/dL   Creatinine, Ser 0.96 0.61 - 1.24 mg/dL   Calcium 8.9 8.9 - 10.3 mg/dL   Total Protein 7.0 6.5 - 8.1 g/dL   Albumin 3.8 3.5 - 5.0 g/dL   AST 18 15 - 41 U/L   ALT 18 0 - 44 U/L    Alkaline Phosphatase 84 38 - 126 U/L   Total Bilirubin 1.9 (H) 0.3 - 1.2 mg/dL   GFR, Estimated >60 >60 mL/min   Anion gap 8 5 - 15  Troponin I (High Sensitivity)  Result Value Ref Range   Troponin I (High Sensitivity) 21 (H) <18 ng/L  Troponin I (High Sensitivity)  Result Value Ref Range   Troponin I (High Sensitivity) 17 <18 ng/L   I personally reviewed labs  Imaging: CT head: Right cerebral convexity acute subdural hematoma measuring up to 6 mm in thickness at the vertex and up to 4.3 mm thickness along the right posterior falx.  3 mm right to left midline shift and slight crowding of the right frontoparietal gyri, but no downward mass effect or significant ventricular effacement. The third ventricle and sylvian aqueduct remain patent. Atrophy, small vessel disease and gangliocapsular lacunar infarcts with chronic appearance and small chronic wedge-shaped left cerebellar infarct. Hemorrhagic fluid levels in the bilateral sphenoid and right posterior ethmoid sinus. Sinus membrane disease. The sinus walls are grossly intact.  Impression/Plan:  Mr. Rosiak is here for evaluation of a fall and a small subdural hematoma, approximately 6 mm on the right side.  His exam right now appears to be intact with some mild confusion of the time.  I do recommend a repeat CT scan in 6 hours to confirm stability of this but given the small size, no surgical intervention is needed.  If patient should have any change in his exam, please notify neurosurgery and get a stat CT of the head.  Otherwise, I would recommend holding his aspirin and any in a coagulation for 7 days.   1.  Diagnosis: Small subdural hematoma  2.  Plan -Repeat CT head 6 hours after the previous -If stable, can follow-up in clinic in 3 to 4 weeks -Would hold any antiplatelet or anticoagulant therapy for 7 days

## 2021-02-21 NOTE — ED Notes (Signed)
Informed family that intubation was successful and family asked where patient was to be transferred to.  Informed that patient would be transferring to [facility].  Son stated that they wished for him to go to [facility].  Paged Dr. Blaine Hamper and Dr. Blaine Hamper spoke with family.  According to Dr. Blaine Hamper, family agrees to transfer to [facility].

## 2021-02-21 NOTE — Progress Notes (Signed)
Pt transported to and from CT1 without event.

## 2021-02-21 NOTE — H&P (Signed)
[name] is an 75 y.o. male.   Chief Complaint: subdural hematoma, fall HPI: [name] was with his wife in the [place] ED when while in the room he fell and struck his head. LOC approximately 1 minute. CT scan performed, Neurosurgery consult requested. No seizures witnessed. Neurological exam at that time 0827 11/7, revealed a gentleman Alert and oriented x2, confused speech clear, following all commands, normal strength. Head CT showed small subdural hematoma. He was hypertensive, treated with nicardipine.  Repeat Head CT showed increased subdural hematoma on the right side, increased agitation. Unsure why, but neurosurgeon then recommended transfer to [facility] for higher level of care.  Patient arrived intubated(due to transport), and sedated, thus neurological exam is delayed. Past Medical History:  Diagnosis Date   AAA (abdominal aortic aneurysm)    a.  CTA 7/19: measured 4.5 x 5.3 cm and greatest transverse dimensions   Abnormal nuclear stress test    a.  Myoview 2012: anterior wall ischemia with an estimated EF of 26%.  This was a new wall motion abnormality as well as a newly reduced EF   COPD (chronic obstructive pulmonary disease) (HCC)    Coronary artery disease    a.  Posterior MI in 2002 status post BMS to the LCx; b. [place] 2012: 95% stenosis of the proximal LAD, 95% stenosis of the mid LAD, 30% in-stent restenosis of the mid left circumflex with a second lesion of diffuse 50% stenosis.  The patient underwent successful PCI/BMS to the mid LAD with 0% residual stenosis, LV gram not performed   GERD (gastroesophageal reflux disease)    History of kidney stones    History of prostate cancer    Hyperlipidemia    Hypertension    Myocardial infarction [facility])    Neuromuscular disorder ([place])    Prostate cancer ([place])    a.  Status post seeding   PVD (peripheral vascular disease) ([place])    Stroke [facility])     Past Surgical History:  Procedure Laterality Date   BACK SURGERY   2009/2010   x 2   CARDIAC CATHETERIZATION  2002   stent placement [place]   ENDOVASCULAR REPAIR/STENT GRAFT N/A 01/02/2018   Procedure: ENDOVASCULAR REPAIR/STENT GRAFT;  Surgeon: Algernon Huxley, MD;  Location: [facility];  Service: Cardiovascular;  Laterality: N/A;   FOOT SURGERY     bilateral    HERNIA REPAIR     bilateral    KNEE SURGERY     right knee    PROSTATE SURGERY  09/2009   prostate implant   SPINE SURGERY      Family History  Problem Relation Age of Onset   Heart attack Mother    Diabetes Mother    Heart disease Father    Lung cancer Daughter    Social History:  reports that he has been smoking cigarettes. He has a 14.00 pack-year smoking history. He has never used smokeless tobacco. He reports that he does not drink alcohol and does not use drugs.  Allergies:  Allergies  Allergen Reactions   Lyrica [Pregabalin]     Swelling    Prednisone     Feels like having a heart attack. Has had the injection form before and does well.    Medications Prior to Admission  Medication Sig Dispense Refill   albuterol (PROVENTIL HFA;VENTOLIN HFA) 108 (90 Base) MCG/ACT inhaler Inhale 2 puffs into the lungs every 6 (six) hours as needed for wheezing. 1 Inhaler 5  aspirin 81 MG tablet Take 81 mg by mouth daily.     atorvastatin (LIPITOR) 80 MG tablet Take 1 tablet (80 mg total) by mouth daily. 90 tablet 3   budesonide-formoterol (SYMBICORT) 160-4.5 MCG/ACT inhaler Inhale 2 puffs into the lungs 2 (two) times daily. 1 Inhaler 5   carvedilol (COREG) 3.125 MG tablet Take 1 tablet (3.125 mg total) by mouth 2 (two) times daily. 180 tablet 3   Cholecalciferol (VITAMIN D3) 2000 units capsule Takes on occassion     montelukast (SINGULAIR) 10 MG tablet Take 10 mg by mouth daily.     Multiple Vitamins-Minerals (MULTIVITAMIN WITH MINERALS) tablet Takes occassionally     omeprazole (PRILOSEC) 40 MG capsule TAKE 1 CAPSULE BY MOUTH DAILY USUALLY 30MINUTES BEFORE BREAKFAST 90 capsule 1    polyethylene glycol powder (GLYCOLAX/MIRALAX) powder Take 17 g by mouth as needed.      sacubitril-valsartan (ENTRESTO) 97-103 MG Take 1 tablet by mouth 2 (two) times daily. 180 tablet 3   spironolactone (ALDACTONE) 25 MG tablet Take 0.5 tablets (12.5 mg total) by mouth daily. 45 tablet 3    Results for orders placed or performed during the hospital encounter of 02/21/21 (from the past 48 hour(s))  Glucose, capillary     Status: Abnormal   Collection Time: 02/21/21  5:01 PM  Result Value Ref Range   Glucose-Capillary 114 (H) 70 - 99 mg/dL    Comment: Glucose reference range applies only to samples taken after fasting for at least 8 hours.   CT HEAD WO CONTRAST (5MM)  Result Date: 02/21/2021 CLINICAL DATA:  Follow-up subdural hematoma. EXAM: CT HEAD WITHOUT CONTRAST TECHNIQUE: Contiguous axial images were obtained from the base of the skull through the vertex without intravenous contrast. COMPARISON:  Earlier today. FINDINGS: Brain: Significant increase in size of the previously demonstrated subdural hematoma with inferior extension along the tentorium with a maximum thickness of 1.8 cm. There is also an interval parenchymal hemorrhage in the right temporal lobe, measuring 2.9 x 2.5 x 2.2 cm. Increased right to left shift, measuring 9 mm. Associated compression of the right lateral ventricle and effacement of the sulci. Vascular: No hyperdense vessel or unexpected calcification. Skull: Normal. Negative for fracture or focal lesion. Sinuses/Orbits: Unremarkable. Other: None. IMPRESSION: 1. Significant increase in size of the previously demonstrated right subdural hematoma with increased right to left shift, currently shifted 9 mm. 2. Interval 2.9 cm right temporal lobe parenchymal hemorrhage. Critical Value/emergent results were called by telephone at the time of interpretation on 02/21/2021 at 11:55 am to provider Ivor Costa , who verbally acknowledged these results. Electronically Signed   By: Claudie Revering M.D.   On: 02/21/2021 12:02   CT HEAD WO CONTRAST (5MM)  Result Date: 02/21/2021 CLINICAL DATA:  Head trauma. EXAM: CT HEAD WITHOUT CONTRAST TECHNIQUE: Contiguous axial images were obtained from the base of the skull through the vertex without intravenous contrast. COMPARISON:  None. FINDINGS: Brain: There is a right cerebral convexity acute subdural hematoma, for the most part measuring 3-4 mm with a maximal diameter up to 6 mm along the frontoparietal vertex. Some of the blood layers along the right posterior falx where it measures up to 4.4 mm in thickness, and there is 3 mm of right-to-left midline shift at the level of the roof of the third ventricle, and slight crowding of the right cerebral gyri in frontoparietal area. There are mild features of atrophy and small vessel disease. There are bilateral gangliocapsular lacunar infarcts with chronic appearance with  no old territorial infarct. There is a small wedge-shaped chronic infarct in the inferior left cerebellar hemisphere, with the brainstem and cerebellum otherwise unremarkable. There is a normal ventricular size. Vascular: There patchy calcifications in the carotid siphons, distal vertebral arteries. There are no hyperdense central vessels. Skull: The calvarium, skull base and orbits are intact. There are no focal bone lesions. There is a small left temporal scalp hematoma. There is a nondisplaced fracture of left zygomatic arch. Sinuses/Orbits: Unremarkable orbital contents. There are dense fluid levels in the bilateral sphenoid sinus and posterior right ethmoid sinus most likely due to hemorrhagic fluid levels, but the sinus walls appear intact. There is mild membrane thickening in maxillary and ethmoid air cells. No mastoid effusion is seen. Other: None. IMPRESSION: 1. Right cerebral convexity acute subdural hematoma measuring up to 6 mm in thickness at the vertex and up to 4.3 mm thickness along the right posterior falx. 2. 3 mm right to left  midline shift and slight crowding of the right frontoparietal gyri, but no downward mass effect or significant ventricular effacement. The third ventricle and sylvian aqueduct remain patent. 3. Atrophy, small vessel disease and gangliocapsular lacunar infarcts with chronic appearance and small chronic wedge-shaped left cerebellar infarct. 4. Hemorrhagic fluid levels in the bilateral sphenoid and right posterior ethmoid sinus. Sinus membrane disease. The sinus walls are grossly intact. 5. Nondisplaced fracture of left zygomatic arch. 6. Results phoned to Dr. Alfred Levins at 5:44 a.m., 02/21/2021, with read back. Electronically Signed   By: Telford Nab M.D.   On: 02/21/2021 05:53   CT Cervical Spine Wo Contrast  Result Date: 02/21/2021 CLINICAL DATA:  75 year old male found down. EXAM: CT CERVICAL SPINE WITHOUT CONTRAST TECHNIQUE: Multidetector CT imaging of the cervical spine was performed without intravenous contrast. Multiplanar CT image reconstructions were also generated. COMPARISON:  None. FINDINGS: Alignment: Preserved cervical lordosis. Mild dextroconvex cervical curvature. Cervicothoracic junction alignment is within normal limits. Bilateral posterior element alignment is within normal limits. Skull base and vertebrae: Visualized skull base is intact. No atlanto-occipital dissociation. C1 and C2 appear intact and aligned. No acute osseous abnormality identified in the cervical spine. Soft tissues and spinal canal: No prevertebral fluid or swelling. No visible canal hematoma. Calcified bilateral cervical carotid atherosclerosis. Otherwise negative visible noncontrast neck soft tissues. Disc levels: Multilevel advanced left side cervical facet arthropathy, but mild for age disc and endplate degeneration. No significant spinal stenosis suspected. Upper chest: Visible upper thoracic levels appear intact. Negative lung apices. Other: Absent dentition. Nondisplaced comminution of the left zygomatic arch on  series 2, image 6. Head CT today reported separately. IMPRESSION: 1. No acute traumatic injury identified in the cervical spine. 2. Nondisplaced fracture of the left zygomatic arch. Electronically Signed   By: Genevie Ann M.D.   On: 02/21/2021 06:01   DG Chest Portable 1 View  Result Date: 02/21/2021 CLINICAL DATA:  Post intubation. Additional history provided: Post intubation, OG tube placement. EXAM: PORTABLE CHEST 1 VIEW COMPARISON:  Prior chest radiographs 02/21/2021 and earlier. FINDINGS: An ET tube is now present, terminating 5.1 cm above the level of the carina. An enteric tube passes below the level of the left hemidiaphragm with tip projecting in the expected location of the gastric body. A small portion of the left lung apex is excluded from the field of view. Heart size within normal limits. Aortic atherosclerosis. Suspected central pulmonary vascular congestion without overt pulmonary edema. Minimal atelectasis within the left lung base. No appreciable airspace consolidation. No evidence of pleural  effusion or pneumothorax. IMPRESSION: Please note a small portion of the left lung apex is excluded from the field of view. ET tube now present, terminating 5.1 cm above the level of the carina. Enteric tube now present with tip projecting in the expected location of the gastric body. Suspected central pulmonary vascular congestion without overt pulmonary edema, unchanged. Minimal atelectasis within the left lung base. Aortic Atherosclerosis (ICD10-I70.0). Electronically Signed   By: Kellie Simmering D.O.   On: 02/21/2021 14:15   DG Chest Portable 1 View  Result Date: 02/21/2021 CLINICAL DATA:  Syncope, urinary incontinence EXAM: PORTABLE CHEST 1 VIEW COMPARISON:  Prior chest x-ray 03/19/2017 FINDINGS: Cardiomegaly, significantly progressed compared to prior. Atherosclerotic calcifications present in the thoracic aorta. The aorta is tortuous. New pulmonary vascular congestion without overt edema. No focal  airspace infiltrate. No pleural effusion or pneumothorax. No acute osseous abnormality. IMPRESSION: Cardiomegaly and pulmonary vascular congestion without overt edema. Aortic atherosclerotic calcifications. Electronically Signed   By: Jacqulynn Cadet M.D.   On: 02/21/2021 05:35   ECHOCARDIOGRAM COMPLETE  Result Date: 02/21/2021    ECHOCARDIOGRAM REPORT   Patient Name:   [name] Date of Exam: 02/21/2021 Medical Rec #:  [id number]      Height:       72.0 in Accession #:    [id number]     Weight:       170.4 lb Date of Birth:  17-Feb-1946      BSA:          1.990 m Patient Age:    38 years       BP:           146/91 mmHg Patient Gender: M              HR:           71 bpm. Exam Location:  [facility] Procedure: 2D Echo, Color Doppler and Cardiac Doppler Indications:     R55 Syncope  History:         Patient has prior history of Echocardiogram examinations, most                  recent 06/24/2020. CAD, COPD, PVD and Stroke; Risk                  Factors:Hypertension and Dyslipidemia.  Sonographer:     Charmayne Sheer Referring Phys:  [id number] Soledad Gerlach NIU Diagnosing Phys: Kate Sable MD  Sonographer Comments: Suboptimal parasternal window. Image acquisition challenging due to uncooperative patient and Image acquisition challenging due to COPD. IMPRESSIONS  1. Left ventricular ejection fraction, by estimation, is 40 to 45%. Left ventricular ejection fraction by 2D MOD biplane is 44.2 %. The left ventricle has mild to moderately decreased function. The left ventricle demonstrates global hypokinesis. There is mild left ventricular hypertrophy of the basal-septal segment. Left ventricular diastolic parameters are consistent with Grade I diastolic dysfunction (impaired relaxation).  2. Right ventricular systolic function is normal. The right ventricular size is normal.  3. The mitral valve is normal in structure. No evidence of mitral valve regurgitation.  4. The aortic valve was not well visualized. Aortic valve regurgitation  is not visualized.  5. The inferior vena cava is normal in size with <50% respiratory variability, suggesting right atrial pressure of 8 mmHg. FINDINGS  Left Ventricle: Left ventricular ejection fraction, by estimation, is 40 to 45%. Left ventricular ejection fraction by 2D MOD biplane is 44.2 %. The left ventricle has mild to moderately decreased function.  The left ventricle demonstrates global hypokinesis. The left ventricular internal cavity size was normal in size. There is mild left ventricular hypertrophy of the basal-septal segment. Left ventricular diastolic parameters are consistent with Grade I diastolic dysfunction (impaired relaxation). Right Ventricle: The right ventricular size is normal. No increase in right ventricular wall thickness. Right ventricular systolic function is normal. Left Atrium: Left atrial size was normal in size. Right Atrium: Right atrial size was normal in size. Pericardium: There is no evidence of pericardial effusion. Mitral Valve: The mitral valve is normal in structure. No evidence of mitral valve regurgitation. MV peak gradient, 6.7 mmHg. The mean mitral valve gradient is 2.0 mmHg. Tricuspid Valve: The tricuspid valve is normal in structure. Tricuspid valve regurgitation is not demonstrated. Aortic Valve: The aortic valve was not well visualized. Aortic valve regurgitation is not visualized. Aortic valve mean gradient measures 6.0 mmHg. Aortic valve peak gradient measures 9.9 mmHg. Aortic valve area, by VTI measures 3.41 cm. Pulmonic Valve: The pulmonic valve was not well visualized. Pulmonic valve regurgitation is not visualized. Aorta: The aortic root was not well visualized. Venous: The inferior vena cava is normal in size with less than 50% respiratory variability, suggesting right atrial pressure of 8 mmHg. IAS/Shunts: No atrial level shunt detected by color flow Doppler.  LEFT VENTRICLE PLAX 2D                        Biplane EF (MOD) LVIDd:         5.30 cm         LV  Biplane EF:   Left LVIDs:         4.20 cm                          ventricular LV PW:         1.30 cm                          ejection LV IVS:        1.00 cm                          fraction by LVOT diam:     2.60 cm                          2D MOD LV SV:         105                              biplane is LV SV Index:   53                               44.2 %. LVOT Area:     5.31 cm                                Diastology                                LV e' medial:    4.79 cm/s LV Volumes (MOD)  LV E/e' medial:  11.8 LV vol d, MOD    193.0 ml      LV e' lateral:   5.22 cm/s A2C:                           LV E/e' lateral: 10.8 LV vol d, MOD    162.0 ml A4C: LV vol s, MOD    108.0 ml A2C: LV vol s, MOD    95.2 ml A4C: LV SV MOD A2C:   85.0 ml LV SV MOD A4C:   162.0 ml LV SV MOD BP:    84.0 ml LEFT ATRIUM             Index LA diam:        4.80 cm 2.41 cm/m LA Vol (A2C):   55.9 ml 28.08 ml/m LA Vol (A4C):   40.1 ml 20.15 ml/m LA Biplane Vol: 49.0 ml 24.62 ml/m  AORTIC VALVE AV Area (Vmax):    3.38 cm AV Area (Vmean):   3.38 cm AV Area (VTI):     3.41 cm AV Vmax:           157.00 cm/s AV Vmean:          112.000 cm/s AV VTI:            0.308 m AV Peak Grad:      9.9 mmHg AV Mean Grad:      6.0 mmHg LVOT Vmax:         99.90 cm/s LVOT Vmean:        71.300 cm/s LVOT VTI:          0.198 m LVOT/AV VTI ratio: 0.64  AORTA Ao Root diam: 3.50 cm MITRAL VALVE MV Area (PHT): 4.17 cm     SHUNTS MV Area VTI:   3.74 cm     Systemic VTI:  0.20 m MV Peak grad:  6.7 mmHg     Systemic Diam: 2.60 cm MV Mean grad:  2.0 mmHg MV Vmax:       1.29 m/s MV Vmean:      65.5 cm/s MV Decel Time: 182 msec MV E velocity: 56.60 cm/s MV A velocity: 102.00 cm/s MV E/A ratio:  0.55 Kate Sable MD Electronically signed by Kate Sable MD Signature Date/Time: 02/21/2021/1:17:34 PM    Final     Review of Systems  Unable to perform ROS: Intubated   Blood pressure (!) 142/73, pulse 62, temperature 98.1 F (36.7 C),  resp. rate (!) 22, SpO2 100 %. Physical Exam Constitutional:      General: He is in acute distress.     Comments: Intubated, previously sedated  HENT:     Head: Normocephalic.     Right Ear: External ear normal.     Left Ear: External ear normal.     Nose:     Comments: Dried blood    Mouth/Throat:     Mouth: Mucous membranes are moist.  Eyes:     Pupils: Pupils are equal, round, and reactive to light.  Cardiovascular:     Rate and Rhythm: Normal rate and regular rhythm.     Pulses: Normal pulses.  Pulmonary:     Effort: Pulmonary effort is normal.  Abdominal:     General: Abdomen is flat.     Palpations: Abdomen is soft.  Musculoskeletal:     Cervical back: Normal range of motion.  Neurological:     Sensory: Sensation is intact.  Motor: Motor function is intact.     Comments: Moving all extremities, not to commands Intubated [place] with painful stimuli Localizing bilateral upper extremities Cannot assess coordination Gait not assessed Pupils small reactive +cough, +gag     Assessment/Plan [name] is a 75 y.o. male Is moving all extremities, still under the effects of sedation. Once I am able to perform a good exam need for operative intervention can be decided.   Ashok Pall, MD 02/21/2021, 5:32 PM

## 2021-02-21 NOTE — ED Notes (Signed)
Phlebotomist at bedside.

## 2021-02-21 NOTE — ED Triage Notes (Signed)
Pt was visiting with wife when staff heard noise from room and entered to find pt on the floor with bloody nose and episode of urinary incontinence. C collar applied and pt transferred to stretcher.

## 2021-02-21 NOTE — H&P (Addendum)
History and Physical    [name] JME:[id number] DOB: [birth year] DOA: 02/21/2021  Referring MD/NP/PA:   PCP: Rusty Aus, MD   Patient coming from:  The patient is coming from home.  At baseline, pt is independent for most of ADL.        Chief Complaint: syncope and fall  HPI: [name] is a 76 y.o. male with medical history significant of CHF with EF 30-35%, hypertension, hyperlipidemia, stroke, GERD, COPD, GERD, PVD, prostate cancer (s/p of XTR seeding), CAD, AAA, tobacco abuse, Barrett's esophagus, who presents with syncope and fall.  Per EDP, patient was here with his wife who is a patient in the emergency room. Staff suddenly heard wife screaming from the room. They at that  immediately walked in the room and found patient unconscious on the floor with epistaxis. Pt had urinary incontinence.  No seizure activity noted.  Episode lasted for about 1 minutes.  Patient seemed confused for several minutes after regaining consciousness. When I saw pt in ED, he is alert, mildly lethargic, but oriented x3.  He moves all extremities normally.  No facial droop or slurred speech.  He denies headache or neck pain to me.  No chest pain or shortness breath.  He states that he has chronic cough due to COPD, which has not changed.  No nausea, vomiting, diarrhea or abdominal pain.  No symptoms of UTI.  Per his wife, patient has dysuria and increased urinary frequency in the past several days.  Patient was found to have elevated blood pressure 233/141, nicardipine drip is started in the ED.   ED Course: pt was found to have WBC 12.9, troponin level 21, 17, positive urinalysis (hazy appearance, moderate amount of leukocyte, positive nitrite, rare bacteria, WBC 11-20), temperature normal, heart rate 82, 114, RR 24, oxygen saturation 94% on room air.  Chest x-ray showed cardiomegaly and vascular congestion. Pt is admitted to stepdown as inpatient.  Neurosurgeon, Dr. Cari Caraway and Dr. Lacinda Axon are  consulted.  CT-head: 1. Right cerebral convexity acute subdural hematoma measuring up to 6 mm in thickness at the vertex and up to 4.3 mm thickness along the right posterior falx. 2. 3 mm right to left midline shift and slight crowding of the right frontoparietal gyri, but no downward mass effect or significant ventricular effacement. The third ventricle and sylvian aqueduct remain patent. 3. Atrophy, small vessel disease and gangliocapsular lacunar infarcts with chronic appearance and small chronic wedge-shaped left cerebellar infarct. 4. Hemorrhagic fluid levels in the bilateral sphenoid and right posterior ethmoid sinus. Sinus membrane disease. The sinus walls are grossly intact. 5. Nondisplaced fracture of left zygomatic arch. 6. Results phoned to Dr. Alfred Levins at 5:44 a.m., 02/21/2021, with read back.  CT-head:  1. No acute traumatic injury identified in the cervical spine. 2. Nondisplaced fracture of the left zygomatic arch.   Review of Systems:   General: no fevers, chills, no body weight gain, has fatigue HEENT: no blurry vision, hearing changes or sore throat Respiratory: no dyspnea, has coughing, no wheezing CV: no chest pain, no palpitations GI: no nausea, vomiting, abdominal pain, diarrhea, constipation GU: has dysuria, increased urinary frequency, no hematuria  Ext: no leg edema Neuro: no unilateral weakness, numbness, or tingling, no vision change or hearing loss. Has fall and syncope Skin: no rash, no skin tear. MSK: No muscle spasm, no deformity, no limitation of range of movement in spin Heme: No easy bruising.  Travel history: No recent long distant travel.  Allergy:  Allergies  Allergen Reactions   Lyrica [Pregabalin]     Swelling    Prednisone     Feels like having a heart attack. Has had the injection form before and does well.    Past Medical History:  Diagnosis Date   AAA (abdominal aortic aneurysm)    a.  CTA 7/19: measured 4.5 x 5.3 cm and  greatest transverse dimensions   Abnormal nuclear stress test    a.  Myoview 2012: anterior wall ischemia with an estimated EF of 26%.  This was a new wall motion abnormality as well as a newly reduced EF   COPD (chronic obstructive pulmonary disease) (HCC)    Coronary artery disease    a.  Posterior MI in 2002 status post BMS to the LCx; b. [place] 2012: 95% stenosis of the proximal LAD, 95% stenosis of the mid LAD, 30% in-stent restenosis of the mid left circumflex with a second lesion of diffuse 50% stenosis.  The patient underwent successful PCI/BMS to the mid LAD with 0% residual stenosis, LV gram not performed   GERD (gastroesophageal reflux disease)    History of kidney stones    History of prostate cancer    Hyperlipidemia    Hypertension    Myocardial infarction [facility])    Neuromuscular disorder ([place])    Prostate cancer ([place])    a.  Status post seeding   PVD (peripheral vascular disease) ([place])    Stroke [facility])     Past Surgical History:  Procedure Laterality Date   BACK SURGERY  2009/2010   x 2   CARDIAC CATHETERIZATION  2002   stent placement [place]   ENDOVASCULAR REPAIR/STENT GRAFT N/A 01/02/2018   Procedure: ENDOVASCULAR REPAIR/STENT GRAFT;  Surgeon: Algernon Huxley, MD;  Location: [facility];  Service: Cardiovascular;  Laterality: N/A;   FOOT SURGERY     bilateral    HERNIA REPAIR     bilateral    KNEE SURGERY     right knee    PROSTATE SURGERY  09/2009   prostate implant   SPINE SURGERY      Social History:  reports that he has been smoking cigarettes. He has a 14.00 pack-year smoking history. He has never used smokeless tobacco. He reports that he does not drink alcohol and does not use drugs.  Family History:  Family History  Problem Relation Age of Onset   Heart attack Mother    Diabetes Mother    Heart disease Father    Lung cancer Daughter      Prior to Admission medications   Medication Sig Start Date End Date Taking? Authorizing Provider   albuterol (PROVENTIL HFA;VENTOLIN HFA) 108 (90 Base) MCG/ACT inhaler Inhale 2 puffs into the lungs every 6 (six) hours as needed for wheezing. 03/19/17 02/21/21 Yes Juanito Doom, MD  aspirin 81 MG tablet Take 81 mg by mouth daily.   Yes [provider]  atorvastatin (LIPITOR) 80 MG tablet Take 1 tablet (80 mg total) by mouth daily. 01/19/21  Yes Minna Merritts, MD  budesonide-formoterol (SYMBICORT) 160-4.5 MCG/ACT inhaler Inhale 2 puffs into the lungs 2 (two) times daily. 12/12/18  Yes Juanito Doom, MD  carvedilol (COREG) 3.125 MG tablet Take 1 tablet (3.125 mg total) by mouth 2 (two) times daily. 01/19/21  Yes Minna Merritts, MD  Cholecalciferol (VITAMIN D3) 2000 units capsule Takes on occassion   Yes [provider]  montelukast (SINGULAIR) 10 MG tablet Take 10 mg by mouth daily.  02/16/20 02/21/21 Yes [provider]  Multiple Vitamins-Minerals (MULTIVITAMIN WITH MINERALS) tablet Takes occassionally   Yes [provider]  omeprazole (PRILOSEC) 40 MG capsule TAKE 1 CAPSULE BY MOUTH DAILY USUALLY 30MINUTES BEFORE BREAKFAST 01/28/19  Yes Crecencio Mc, MD  sacubitril-valsartan (ENTRESTO) 97-103 MG Take 1 tablet by mouth 2 (two) times daily. 01/19/21  Yes Minna Merritts, MD  spironolactone (ALDACTONE) 25 MG tablet Take 0.5 tablets (12.5 mg total) by mouth daily. 01/19/21 07/18/21 Yes Gollan, Kathlene November, MD  polyethylene glycol powder (GLYCOLAX/MIRALAX) powder Take 17 g by mouth as needed.  04/24/11   [provider]    Physical Exam: Vitals:   02/21/21 0900 02/21/21 0910 02/21/21 0920 02/21/21 0930  BP:  126/88 (!) 143/76 (!) 139/96  Pulse:  69 64 72  Resp:  20 15 19   Temp:      TempSrc:      SpO2:  94% 94% 95%  Weight: 77.3 kg     Height:       General: Not in acute distress HEENT:       Eyes: PERRL, EOMI, no scleral icterus.       ENT: No discharge from the ears and nose, no pharynx injection, no tonsillar enlargement.        Neck:  No JVD, no bruit, no mass felt. Heme: No neck lymph node enlargement. Cardiac: S1/S2, RRR, No murmurs, No gallops or rubs. Respiratory: No rales, wheezing, rhonchi or rubs. GI: Soft, nondistended, nontender, no rebound pain, no organomegaly, BS present. GU: No hematuria Ext: No pitting leg edema bilaterally. 1+DP/PT pulse bilaterally. Musculoskeletal: No joint deformities, No joint redness or warmth, no limitation of ROM in spin. Skin: No rashes.  Neuro: Lethargic, but is oriented X3, cranial nerves II-XII grossly intact, moves all extremities normally.  Psych: Patient is not psychotic, no suicidal or hemocidal ideation.  Labs on Admission: I have personally reviewed following labs and imaging studies  CBC: Recent Labs  Lab 02/21/21 0503  WBC 12.9*  NEUTROABS 9.7*  HGB 16.1  HCT 47.9  MCV 95.4  PLT 474   Basic Metabolic Panel: Recent Labs  Lab 02/21/21 0503  NA 140  K 4.2  CL 104  CO2 28  GLUCOSE 108*  BUN 18  CREATININE 0.96  CALCIUM 8.9   GFR: Estimated Creatinine Clearance: 70.5 mL/min (by C-G formula based on SCr of 0.96 mg/dL). Liver Function Tests: Recent Labs  Lab 02/21/21 0503  AST 18  ALT 18  ALKPHOS 84  BILITOT 1.9*  PROT 7.0  ALBUMIN 3.8   No results for input(s): LIPASE, AMYLASE in the last 168 hours. No results for input(s): AMMONIA in the last 168 hours. Coagulation Profile: Recent Labs  Lab 02/21/21 0503  INR 1.1   Cardiac Enzymes: No results for input(s): CKTOTAL, CKMB, CKMBINDEX, TROPONINI in the last 168 hours. BNP (last 3 results) No results for input(s): PROBNP in the last 8760 hours. HbA1C: No results for input(s): HGBA1C in the last 72 hours. CBG: Recent Labs  Lab 02/21/21 0943  GLUCAP 115*   Lipid Profile: No results for input(s): CHOL, HDL, LDLCALC, TRIG, CHOLHDL, LDLDIRECT in the last 72 hours. Thyroid Function Tests: No results for input(s): TSH, T4TOTAL, FREET4, T3FREE, THYROIDAB in the last 72 hours. Anemia  Panel: No results for input(s): VITAMINB12, FOLATE, FERRITIN, TIBC, IRON, RETICCTPCT in the last 72 hours. Urine analysis:    Component Value Date/Time   COLORURINE YELLOW (A) 02/21/2021 0641   APPEARANCEUR HAZY (A) 02/21/2021 2595  APPEARANCEUR Cloudy 04/21/2011 1418   LABSPEC 1.021 02/21/2021 0641   LABSPEC 1.013 04/21/2011 1418   PHURINE 5.0 02/21/2021 0641   GLUCOSEU NEGATIVE 02/21/2021 0641   GLUCOSEU Negative 04/21/2011 1418   HGBUR MODERATE (A) 02/21/2021 0641   BILIRUBINUR NEGATIVE 02/21/2021 0641   BILIRUBINUR Negative 04/21/2011 1418   KETONESUR 5 (A) 02/21/2021 0641   PROTEINUR 30 (A) 02/21/2021 0641   NITRITE POSITIVE (A) 02/21/2021 0641   LEUKOCYTESUR MODERATE (A) 02/21/2021 0641   LEUKOCYTESUR Negative 04/21/2011 1418   Sepsis Labs: @LABRCNTIP (procalcitonin:4,lacticidven:4) )No results found for this or any previous visit (from the past 240 hour(s)).   Radiological Exams on Admission: CT HEAD WO CONTRAST (5MM)  Result Date: 02/21/2021 CLINICAL DATA:  Head trauma. EXAM: CT HEAD WITHOUT CONTRAST TECHNIQUE: Contiguous axial images were obtained from the base of the skull through the vertex without intravenous contrast. COMPARISON:  None. FINDINGS: Brain: There is a right cerebral convexity acute subdural hematoma, for the most part measuring 3-4 mm with a maximal diameter up to 6 mm along the frontoparietal vertex. Some of the blood layers along the right posterior falx where it measures up to 4.4 mm in thickness, and there is 3 mm of right-to-left midline shift at the level of the roof of the third ventricle, and slight crowding of the right cerebral gyri in frontoparietal area. There are mild features of atrophy and small vessel disease. There are bilateral gangliocapsular lacunar infarcts with chronic appearance with no old territorial infarct. There is a small wedge-shaped chronic infarct in the inferior left cerebellar hemisphere, with the brainstem and cerebellum  otherwise unremarkable. There is a normal ventricular size. Vascular: There patchy calcifications in the carotid siphons, distal vertebral arteries. There are no hyperdense central vessels. Skull: The calvarium, skull base and orbits are intact. There are no focal bone lesions. There is a small left temporal scalp hematoma. There is a nondisplaced fracture of left zygomatic arch. Sinuses/Orbits: Unremarkable orbital contents. There are dense fluid levels in the bilateral sphenoid sinus and posterior right ethmoid sinus most likely due to hemorrhagic fluid levels, but the sinus walls appear intact. There is mild membrane thickening in maxillary and ethmoid air cells. No mastoid effusion is seen. Other: None. IMPRESSION: 1. Right cerebral convexity acute subdural hematoma measuring up to 6 mm in thickness at the vertex and up to 4.3 mm thickness along the right posterior falx. 2. 3 mm right to left midline shift and slight crowding of the right frontoparietal gyri, but no downward mass effect or significant ventricular effacement. The third ventricle and sylvian aqueduct remain patent. 3. Atrophy, small vessel disease and gangliocapsular lacunar infarcts with chronic appearance and small chronic wedge-shaped left cerebellar infarct. 4. Hemorrhagic fluid levels in the bilateral sphenoid and right posterior ethmoid sinus. Sinus membrane disease. The sinus walls are grossly intact. 5. Nondisplaced fracture of left zygomatic arch. 6. Results phoned to Dr. Alfred Levins at 5:44 a.m., 02/21/2021, with read back. Electronically Signed   By: Telford Nab M.D.   On: 02/21/2021 05:53   CT Cervical Spine Wo Contrast  Result Date: 02/21/2021 CLINICAL DATA:  75 year old male found down. EXAM: CT CERVICAL SPINE WITHOUT CONTRAST TECHNIQUE: Multidetector CT imaging of the cervical spine was performed without intravenous contrast. Multiplanar CT image reconstructions were also generated. COMPARISON:  None. FINDINGS: Alignment:  Preserved cervical lordosis. Mild dextroconvex cervical curvature. Cervicothoracic junction alignment is within normal limits. Bilateral posterior element alignment is within normal limits. Skull base and vertebrae: Visualized skull base is intact. No  atlanto-occipital dissociation. C1 and C2 appear intact and aligned. No acute osseous abnormality identified in the cervical spine. Soft tissues and spinal canal: No prevertebral fluid or swelling. No visible canal hematoma. Calcified bilateral cervical carotid atherosclerosis. Otherwise negative visible noncontrast neck soft tissues. Disc levels: Multilevel advanced left side cervical facet arthropathy, but mild for age disc and endplate degeneration. No significant spinal stenosis suspected. Upper chest: Visible upper thoracic levels appear intact. Negative lung apices. Other: Absent dentition. Nondisplaced comminution of the left zygomatic arch on series 2, image 6. Head CT today reported separately. IMPRESSION: 1. No acute traumatic injury identified in the cervical spine. 2. Nondisplaced fracture of the left zygomatic arch. Electronically Signed   By: Genevie Ann M.D.   On: 02/21/2021 06:01   DG Chest Portable 1 View  Result Date: 02/21/2021 CLINICAL DATA:  Syncope, urinary incontinence EXAM: PORTABLE CHEST 1 VIEW COMPARISON:  Prior chest x-ray 03/19/2017 FINDINGS: Cardiomegaly, significantly progressed compared to prior. Atherosclerotic calcifications present in the thoracic aorta. The aorta is tortuous. New pulmonary vascular congestion without overt edema. No focal airspace infiltrate. No pleural effusion or pneumothorax. No acute osseous abnormality. IMPRESSION: Cardiomegaly and pulmonary vascular congestion without overt edema. Aortic atherosclerotic calcifications. Electronically Signed   By: Jacqulynn Cadet M.D.   On: 02/21/2021 05:35     EKG: I have personally reviewed.  Sinus rhythm, QTC 414, Q waves in inferior leads, artificial  reflux  Assessment/Plan Principal Problem:   SDH (subdural hematoma) Active Problems:   Hyperlipidemia   CAD, NATIVE VESSEL   GERD   Tobacco abuse   COPD (chronic obstructive pulmonary disease) (HCC)   Syncope   Fall   Hypertensive urgency   HTN (hypertension)   Stroke (HCC)   Chronic combined systolic and diastolic CHF (congestive heart failure) (HCC)   Leukocytosis   Elevated troponin   Zygomatic arch fracture (HCC)   UTI (urinary tract infection)   Sepsis (HCC)   SDH (subdural hematoma): CT scan showed acute SDH with 3 mm right to left midline shift, but no downward mass effect or significant ventricular effacement.  Dr. Cari Caraway and Dr. Lacinda Axon of neurosurgery are consulted.  They recommend to repeat the CT of head in 6 hours.  Patient has hypertensive urgency with blood pressure 233/141, nicardipine drip is started, targeting SBP <160. -will admit to SDU as inpt - repeat CT-head at 11:30 AM -continue nicardipine drip -Frequent neuro check -Hold aspirin -Consulted Dr. Patsey Berthold of ICU -Follow-up neurosurgery further recommendation  Zygomatic arch fracture: CT showed nondisplaced fracture of left zygomatic arch and hemorrhagic fluid levels in the bilateral sphenoid and right posterior ethmoid sinus.  -consulted Dr. Kathyrn Sheriff of ENT -Prn percocet and tylenol for pain  Syncope and fall: Etiology is not clear.  No seizure activity noted.  On physical examination patient does not have focal neurological findings, low suspicions for stroke.  Patient has low EF of 30 to 35%, suspecting cardiac etiology.  UTI may have contributed partially. -Fall precaution for precaution -Orthostatic status check -2D echo -Telemetry monitor -Consulted Dr. Sophronia Simas of cardiology  Hypertensive urgency and HTN (hypertension): -Continue Coreg -Patient is on Entresto and spironolactone which is overall CHF -Continue Cardizem drip, targeting SBP <160  Hyperlipidemia -Lipitor  CAD, NATIVE VESSEL  and elevated troponin: Troponin is minimally elevated 21, 17, 21.  No chest pain, likely demand ischemia -Hold aspirin due to subdural hematoma -Continue Lipitor, Coreg -Check A1c, FLP  GERD -Protonix  Tobacco abuse -Nicotine patch  COPD (chronic obstructive pulmonary disease) ([place]):  No wheezing or rhonchi -Bronchodilators  Stroke (HCC) -Hold aspirin -Continue Lipitor  Chronic combined systolic and diastolic CHF (congestive heart failure) ([place]): 2D echo on 06/24/2020 showed EF 30-35% with grade 1 diastolic dysfunction.  Patient does not have leg edema.  No shortness of breath, CHF seem to be compensated. -Continue home spironolactone, Entresto  Sepsis due to UTI (urinary tract infection): Patient meets criteria for sepsis with WBC 12.9, tachycardia with heart rate up to 114, RR 24.  Pending lactic acid level -IV Rocephin -Follow-up of blood culture and urine culture -will get Procalcitonin and trend lactic acid levels per sepsis protocol. -IVF: due to EF of 30-35% , will not give IV fluid unless lactic acid is significantly elevated     DVT ppx: SCD Code Status: partial code per his wife, his son and his two stepsons ( OK for CPR and no intubation) Family Communication:  Yes, patient's  wife and two sons  at bed side Disposition Plan:  Anticipate discharge back to previous environment Consults called:  Dr. Izora Ribas of and Dr. Lacinda Axon of neurosurgery Admission status and Level of care: Stepdown:    as inpt        Status is: Inpatient  Remains inpatient appropriate because: Patient has multiple comorbidities, now presents with syncope, fall, found to have subdural hematoma with midline shift.  Also has a sepsis due to UTI and elevated troponin.  His presentation is highly complicated.  Patient is at high risk of deteriorating.  Need to be treated in the hospital for the next 2 days.          Date of Service 02/21/2021    Ivor Costa [facility]   If 7PM-7AM,  please contact night-coverage [email or web address] 02/21/2021, 10:15 AM

## 2021-02-21 NOTE — Progress Notes (Signed)
Patient ID: [name], male   DOB: 06/22/1945, 75 y.o.   MRN: [id number] BP 136/71   Pulse (!) 58   Temp 98.6 F (37 C)   Resp (!) 22   SpO2 100%  CT reviewed. Basal cisterns patent, no significant mass effect No operative indication at this time.

## 2021-02-21 NOTE — ED Notes (Signed)
Pt changed into clean brief and new gown.

## 2021-02-21 NOTE — ED Notes (Signed)
Pt repeatedly trying to get out of bed and is now more agitated.

## 2021-02-21 NOTE — Progress Notes (Signed)
Patient seen after repeat CT showing increase in the subdural hematoma around the tentorium.  There is additionally a new left IPH.  I discussed with those at bedside that this is all from his recent trauma and could continue to increase.  His neurologic exam at this time remains at his baseline.  He is having some agitation.  Given the new findings, I would recommend keeping systolic blood pressure less than 160 and transfusion of 1 unit of platelets for history of aspirin use.  He will he be transferred to a higher level facility for neuro monitoring in an intensive care unit.  He may be a candidate for intervention should this worsen.  Recommend transfer at this time.

## 2021-02-21 NOTE — Progress Notes (Signed)
*  PRELIMINARY RESULTS* Echocardiogram 2D Echocardiogram has been performed.  [name] 02/21/2021, 11:44 AM

## 2021-02-21 NOTE — ED Notes (Signed)
Neurosurgery team at bedside

## 2021-02-21 NOTE — Progress Notes (Signed)
Pharmacy Antibiotic Note  [name] is a 75 y.o. male admitted on 02/21/2021 with UTI.  Pharmacy has been consulted for Ceftriaxone dosing.  Plan: Start ceftriaxone 1 gm IV q24hr Monitor urine culture   Temp (24hrs), Avg:98.6 F (37 C), Min:98.1 F (36.7 C), Max:98.9 F (37.2 C)  Recent Labs  Lab 02/21/21 0503 02/21/21 1050  WBC 12.9*  --   CREATININE 0.96  --   LATICACIDVEN  --  1.0    Estimated Creatinine Clearance: 70.5 mL/min (by C-G formula based on SCr of 0.96 mg/dL).    Allergies  Allergen Reactions   Lyrica [Pregabalin]     Swelling    Prednisone     Feels like having a heart attack. Has had the injection form before and does well.    Antimicrobials this admission: Ceftriaxone 11/7 >>   Thank you for allowing pharmacy to be a part of this patient's care.  Alanda Slim, PharmD, [facility] Clinical Pharmacist Please see AMION for all Pharmacists' Contact Phone Numbers 02/21/2021, 5:47 PM

## 2021-02-21 NOTE — Procedures (Signed)
Intubation Procedure Note  KEDDRICK WYNE  [id number]  1945-11-15  Date:02/21/21  Time:1:53 PM   Provider Performing:Froylan Hobby D Dewaine Conger    Procedure: Intubation (93734)  Indication(s) Respiratory Failure  Consent Risks of the procedure as well as the alternatives and risks of each were explained to the patient and/or caregiver.  Consent for the procedure was obtained and is signed in the bedside chart   Anesthesia Etomidate, Fentanyl, and Rocuronium   Time Out Verified patient identification, verified procedure, site/side was marked, verified correct patient position, special equipment/implants available, medications/allergies/relevant history reviewed, required imaging and test results available.   Sterile Technique Usual hand hygeine, masks, and gloves were used   Procedure Description Patient positioned in bed supine.  Sedation given as noted above.  Patient was intubated with endotracheal tube using Glidescope.  View was Grade 1 full glottis .  Number of attempts was 1.  Colorimetric CO2 detector was consistent with tracheal placement.   Complications/Tolerance None; patient tolerated the procedure well. Chest X-ray is ordered to verify placement.   EBL Minimal   Specimen(s) None   Size 8.0 ETT  Tube was secured at 24 cm at the lip.    Darel Hong, AGACNP-BC [facility] Prefer epic messenger for cross cover needs If after hours, please call E-link

## 2021-02-21 NOTE — ED Notes (Signed)
Pt noted to be responding to internal stimuli.

## 2021-02-21 NOTE — ED Notes (Signed)
Informed RN bed assigned 

## 2021-02-22 DIAGNOSIS — R569 Unspecified convulsions: Secondary | ICD-10-CM

## 2021-02-22 LAB — COMPREHENSIVE METABOLIC PANEL
ALT: 13 U/L (ref 0–44)
AST: 18 U/L (ref 15–41)
Albumin: 2.8 g/dL — ABNORMAL LOW (ref 3.5–5.0)
Alkaline Phosphatase: 60 U/L (ref 38–126)
Anion gap: 7 (ref 5–15)
BUN: 15 mg/dL (ref 8–23)
CO2: 25 mmol/L (ref 22–32)
Calcium: 8.2 mg/dL — ABNORMAL LOW (ref 8.9–10.3)
Chloride: 105 mmol/L (ref 98–111)
Creatinine, Ser: 0.95 mg/dL (ref 0.61–1.24)
GFR, Estimated: >60 mL/min (ref >60)
Glucose, Bld: 86 mg/dL (ref 70–99)
Potassium: 3.4 mmol/L — ABNORMAL LOW (ref 3.5–5.1)
Sodium: 137 mmol/L (ref 135–145)
Total Bilirubin: 1.8 mg/dL — ABNORMAL HIGH (ref 0.3–1.2)
Total Protein: 5.1 g/dL — ABNORMAL LOW (ref 6.5–8.1)

## 2021-02-22 LAB — CBC
HCT: 38.4 % — ABNORMAL LOW (ref 39.0–52.0)
Hemoglobin: 12.5 g/dL — ABNORMAL LOW (ref 13.0–17.0)
MCH: 31.4 pg (ref 26.0–34.0)
MCHC: 32.6 g/dL (ref 30.0–36.0)
MCV: 96.5 fL (ref 80.0–100.0)
Platelets: 113 10*3/uL — ABNORMAL LOW (ref 150–400)
RBC: 3.98 MIL/uL — ABNORMAL LOW (ref 4.22–5.81)
RDW: 13.1 % (ref 11.5–15.5)
WBC: 8.8 10*3/uL (ref 4.0–10.5)
nRBC: 0 % (ref 0.0–0.2)

## 2021-02-22 LAB — GLUCOSE, CAPILLARY
Glucose-Capillary: 78 mg/dL (ref 70–99)
Glucose-Capillary: 78 mg/dL (ref 70–99)
Glucose-Capillary: 80 mg/dL (ref 70–99)
Glucose-Capillary: 87 mg/dL (ref 70–99)
Glucose-Capillary: 88 mg/dL (ref 70–99)
Glucose-Capillary: 90 mg/dL (ref 70–99)
Glucose-Capillary: 96 mg/dL (ref 70–99)

## 2021-02-22 LAB — TRIGLYCERIDES: Triglycerides: 81 mg/dL (ref <150)

## 2021-02-22 MED ORDER — POTASSIUM CHLORIDE 20 MEQ PO PACK
40.0000 meq | PACK | Freq: Once | ORAL | Status: AC
  Administered 2021-02-22: 40 meq
  Filled 2021-02-22: qty 2

## 2021-02-22 MED ORDER — CLONIDINE HCL 0.1 MG PO TABS
0.1000 mg | ORAL_TABLET | Freq: Two times a day (BID) | ORAL | Status: DC
  Administered 2021-02-22 – 2021-02-23 (×2): 0.1 mg via ORAL
  Filled 2021-02-22 (×2): qty 1

## 2021-02-22 MED ORDER — HYDROCODONE-ACETAMINOPHEN 5-325 MG PO TABS
1.0000 | ORAL_TABLET | ORAL | Status: DC | PRN
Start: 2021-02-22 — End: 2021-02-26
  Administered 2021-02-22 – 2021-02-23 (×3): 1 via ORAL
  Administered 2021-02-23: 2 via ORAL
  Administered 2021-02-24: 1 via ORAL
  Administered 2021-02-24 – 2021-02-26 (×7): 2 via ORAL
  Filled 2021-02-22 (×3): qty 2
  Filled 2021-02-22: qty 1
  Filled 2021-02-22: qty 2
  Filled 2021-02-22: qty 1
  Filled 2021-02-22: qty 2
  Filled 2021-02-22: qty 1
  Filled 2021-02-22 (×2): qty 2
  Filled 2021-02-22: qty 1
  Filled 2021-02-22: qty 2

## 2021-02-22 MED ORDER — CARVEDILOL 3.125 MG PO TABS
3.1250 mg | ORAL_TABLET | Freq: Two times a day (BID) | ORAL | Status: DC
  Administered 2021-02-23 – 2021-02-26 (×7): 3.125 mg via ORAL
  Filled 2021-02-22 (×7): qty 1

## 2021-02-22 MED ORDER — DOCUSATE SODIUM 100 MG PO CAPS
100.0000 mg | ORAL_CAPSULE | Freq: Two times a day (BID) | ORAL | Status: DC
  Administered 2021-02-23 – 2021-02-26 (×6): 100 mg via ORAL
  Filled 2021-02-22 (×6): qty 1

## 2021-02-22 MED ORDER — HYDRALAZINE HCL 20 MG/ML IJ SOLN
10.0000 mg | INTRAMUSCULAR | Status: DC | PRN
  Administered 2021-02-22 – 2021-02-26 (×7): 10 mg via INTRAVENOUS
  Filled 2021-02-22 (×8): qty 1

## 2021-02-22 NOTE — Progress Notes (Signed)
NAME:  [name], MRN:  [id number], DOB:  November 01, 1945, LOS: 1 ADMISSION DATE:  02/21/2021, CONSULTATION DATE:  11/7 REFERRING MD:  Christella Noa, CHIEF COMPLAINT:  acute SDH   History of Present Illness:  75 year old male patient with history as below was at [facility] emergency room visiting his wife who is under evaluation there.  Suddenly nursing staff heard wife screaming in the room, on arrival they found the patient unconscious on the floor with epistaxis and urinary incontinence.  Per report he was unconscious for approximately 1 minute, following this he was confused for several minutes then improved.  On initial evaluation by critical care he was awake, oriented x3 without focal deficits and had no slurred speech or dysarthria.  His initial CT of head showed right subdural hematoma C-spine was normal.  While in the emergency room he became progressively more restless and agitated a repeat CT of head showed progression of right subdural hematoma with new right to left shift.  He was evaluated by neurosurgery at [facility] who reported they did not have capacity to assist him and therefore he was transferred to [facility] for neurosurgical evaluation.  He was intubated for airway protection and transferred to [facility] on arrival he was sedated on fentanyl and propofol infusion  Pertinent  Medical History  CHF w ef 30-35% HTN HL prior stroke tobacco abuse prostate cancer CAD AAA   Significant Hospital Events: Including procedures, antibiotic start and stop dates in addition to other pertinent events   11/7 admitted status post syncopal event/fall resulting in right subdural hematoma.  Mental status deteriorated while in ER, repeat CT imaging showed progression of right subdural hematoma transferred to [facility] for neurosurgical evaluation  Interim History / Subjective:  Currently sedated   Objective   Blood pressure 125/66, pulse (!) 50, temperature 99.3 F (37.4 C),  resp. rate (!) 22, SpO2 99 %.    Vent Mode: PRVC FiO2 (%):  [40 %-50 %] 40 % Set Rate:  [16 bmp-22 bmp] 22 bmp Vt Set:  [450 mL-480 mL] 480 mL PEEP:  [5 cmH20] 5 cmH20 Plateau Pressure:  [13 cmH20-16 cmH20] 13 cmH20   Intake/Output Summary (Last 24 hours) at 02/22/2021 0856 Last data filed at 02/22/2021 0800 Gross per 24 hour  Intake 251.22 ml  Output 345 ml  Net -93.78 ml   There were no vitals filed for this visit.  Examination: General:  Ill appearing older adult M intubated, paused sedation, NAD  Neuro: Recently sedated on prop (paused for exam). Winces to pain does not follow commands. PERRL 56mm HENT: ETT secure. Anicteric sclera. Trachea midline  Lungs: Symmetrical chest expansion, Mechanically ventilated. Coarse sounds  Cardiovascular: bradycardic, regular. s1s2 cap refill brisk  Abdomen: soft ndnt  GU:  wnl Extremities: no acute deformity no cyanosis or clubbing  Skin: c/d/w scattered ecchymosis    Resolved Hospital Problem list   Hypertensive urgency resolved  Assessment & Plan:   Traumatic R SDH Syncope with fall  -ground level fall, + LOC x 1 min. ? If syncope prior to fall or if LOC after fall. No sz captured on EEG. ECHO not suggestive of PE  -CT imaging suggesting progression of bleed and was transferred to [facility] from [facility]  P SBP goal < 160 No NSGY intervention at this point   Acute respiratory failure with ineffective airway clearance 2/2 above Hx COPD  Tobacco abuse  -was intubated for transport from [facility] to [facility].  P WUA/ SBT If  unable to extubate, cont MV support and weaning efforts as able Bronchodilators   Combined systolic and diastolic HF HTN HLD AAA PVD  Mildly elevated troponin secondary to probable demand ischemia P Holding antiplatelet agents Coreg ICU monitoring   UTI  P Rocephin Follow cx  Will also hope to remove foley 11/8   Hypokalemia, mild P 18mEq enteral KCl    Best Practice (right click and "Reselect all SmartList  Selections" daily)   Diet/type: NPO w/ meds via tube DVT prophylaxis: SCD GI prophylaxis: PPI Lines: N/A Foley:  N/A Code Status:  full code Last date of multidisciplinary goals of care discussion [pending ]  Labs   CBC: Recent Labs  Lab 02/21/21 0503 02/21/21 1802 02/22/21 0546  WBC 12.9*  --  8.8  NEUTROABS 9.7*  --   --   HGB 16.1 12.9* 12.5*  HCT 47.9 38.0* 38.4*  MCV 95.4  --  96.5  PLT 156  --  113*    Basic Metabolic Panel: Recent Labs  Lab 02/21/21 0503 02/21/21 1802 02/22/21 0546  NA 140 138 137  K 4.2 3.6 3.4*  CL 104  --  105  CO2 28  --  25  GLUCOSE 108*  --  86  BUN 18  --  15  CREATININE 0.96  --  0.95  CALCIUM 8.9  --  8.2*   GFR: Estimated Creatinine Clearance: 71.2 mL/min (by C-G formula based on SCr of 0.95 mg/dL). Recent Labs  Lab 02/21/21 0503 02/21/21 1050 02/22/21 0546  PROCALCITON  --  <0.10  --   WBC 12.9*  --  8.8  LATICACIDVEN  --  1.0  --     Liver Function Tests: Recent Labs  Lab 02/21/21 0503 02/22/21 0546  AST 18 18  ALT 18 13  ALKPHOS 84 60  BILITOT 1.9* 1.8*  PROT 7.0 5.1*  ALBUMIN 3.8 2.8*   No results for input(s): LIPASE, AMYLASE in the last 168 hours. No results for input(s): AMMONIA in the last 168 hours.  ABG    Component Value Date/Time   PHART 7.357 02/21/2021 1802   PCO2ART 47.4 02/21/2021 1802   PO2ART 130 (H) 02/21/2021 1802   HCO3 26.7 02/21/2021 1802   TCO2 28 02/21/2021 1802   O2SAT 99.0 02/21/2021 1802     Coagulation Profile: Recent Labs  Lab 02/21/21 0503  INR 1.1    Cardiac Enzymes: Recent Labs  Lab 02/21/21 1810  CKTOTAL 113  CKMB 4.7    HbA1C: Hgb A1c MFr Bld  Date/Time Value Ref Range Status  02/21/2021 08:54 AM 5.5 4.8 - 5.6 % Final    Comment:    (NOTE) Pre diabetes:          5.7%-6.4%  Diabetes:              >6.4%  Glycemic control for   <7.0% adults with diabetes     CBG: Recent Labs  Lab 02/21/21 1701 02/21/21 2012 02/22/21 0003 02/22/21 0353  02/22/21 0803  GLUCAP 114* 95 96 87 78    CRITICAL CARE Performed by: Cristal Generous   Total critical care time: 35 minutes  Critical care time was exclusive of separately billable procedures and treating other patients. Critical care was necessary to treat or prevent imminent or life-threatening deterioration.  Critical care was time spent personally by me on the following activities: development of treatment plan with patient and/or surrogate as well as nursing, discussions with consultants, evaluation of patient's response to treatment, examination  of patient, obtaining history from patient or surrogate, ordering and performing treatments and interventions, ordering and review of laboratory studies, ordering and review of radiographic studies, pulse oximetry and re-evaluation of patient's condition.  Eliseo Gum MSN, AGACNP-BC [place] for pager  02/22/2021, 8:56 AM

## 2021-02-22 NOTE — Progress Notes (Signed)
Patient ID: [name], male   DOB: 26-Dec-1945, 75 y.o.   MRN: [id number] BP (!) 155/80   Pulse 80   Temp 99.4 F (37.4 C) (Axillary)   Resp (!) 23   SpO2 95%  Alert, oriented to person and place, and situation Moving all extremities Obviously improved. Will continue to monitor.  Given a pain medication, and a antihypertensive

## 2021-02-22 NOTE — Evaluation (Signed)
Physical Therapy Evaluation Patient Details Name: [name] MRN: [id number] DOB: 1945/09/16 Today's Date: 02/22/2021  History of Present Illness  Pt admitted from [facility] where he was with his wife in the ED and had a syncopal episode resulting in R SDH. Pt transferred to [facility] for further evaluation due to neuro decline after event. PMH: prostate cancer, CAD, AAA, CVA, HTN, HLD, CHF, [name] OA.  Clinical Impression  Pt admitted with above diagnosis. Pt from home with wife where he assisted her with daily HD and she just fell and broke her hip. He does not remember falling in ED. Pt able to come to EOB without physical assist. Min -guard needed to stand to RW and pt was mildly unsteady when moving hands from bed/ chair to RW. Pt ambulated short distance with min A and RW. Expect he will be able to go home with [facility] if family can stay with him initially. If they cannot, may need to update to higher level care.  Pt currently with functional limitations due to the deficits listed below (see PT Problem List). Pt will benefit from skilled PT to increase their independence and safety with mobility to allow discharge to the venue listed below.          Recommendations for follow up therapy are one component of a multi-disciplinary discharge planning process, led by the attending physician.  Recommendations may be updated based on patient status, additional functional criteria and insurance authorization.  Follow Up Recommendations Home health PT    Assistance Recommended at Discharge Frequent or constant Supervision/Assistance  Functional Status Assessment Patient has had a recent decline in their functional status and demonstrates the ability to make significant improvements in function in a reasonable and predictable amount of time.  Equipment Recommendations  None recommended by PT    Recommendations for Other Services       Precautions / Restrictions Precautions Precautions:  Fall Restrictions Weight Bearing Restrictions: No      Mobility  Bed Mobility Overal bed mobility: Needs Assistance Bed Mobility: Supine to Sit     Supine to sit: Supervision;HOB elevated     General bed mobility comments: pt able to move LE's off EOB with increased time and use of bed rail from [facility] elevated position    Transfers Overall transfer level: Needs assistance Equipment used: Rolling walker (2 wheels) Transfers: Sit to/from Stand Sit to Stand: Min guard           General transfer comment: min-guard for safety, increased time extending knees, slightly off balance moving hands from arm rests to RW    Ambulation/Gait Ambulation/Gait assistance: Min assist Gait Distance (Feet): 3 Feet Assistive device: Rolling walker (2 wheels) Gait Pattern/deviations: Step-to pattern Gait velocity: decreased Gait velocity interpretation: <1.31 ft/sec, indicative of household ambulator   General Gait Details: pt able to take small steps, fatigued with mobility today and this was the first time he had been up  [employer] Mobility    Modified Rankin (Stroke Patients Only)       Balance Overall balance assessment: Needs assistance;History of Falls Sitting-balance support: Feet supported;No upper extremity supported Sitting balance-Leahy Scale: Fair     Standing balance support: Bilateral upper extremity supported;Reliant on assistive device for balance Standing balance-Leahy Scale: Poor Standing balance comment: reliant on UE support  Pertinent Vitals/Pain Pain Assessment: No/denies pain    Home Living Family/patient expects to be discharged to:: Private residence Living Arrangements: Spouse/significant other Available Help at Discharge: Family;Available 24 hours/day Type of Home: House Home Access: Ramped entrance       Home Layout: One level Home Equipment: Conservation officer, nature (2 wheels);Cane -  single point;Shower seat Additional Comments: pt lives with his wife but she just broke her hip for the second time (first time was 2/22). Family lives nearby but they work in the day.    Prior Function Prior Level of Function : Independent/Modified Independent;Driving             Mobility Comments: pt uses cane for "safety" per his report       Hand Dominance        Extremity/Trunk Assessment   Upper Extremity Assessment Upper Extremity Assessment: Defer to OT evaluation    Lower Extremity Assessment Lower Extremity Assessment: Generalized weakness    Cervical / Trunk Assessment Cervical / Trunk Assessment: Normal  Communication   Communication: No difficulties  Cognition Arousal/Alertness: Lethargic Behavior During Therapy: WFL for tasks assessed/performed Overall Cognitive Status: No family/caregiver present to determine baseline cognitive functioning                                 General Comments: mildly slow to respond and was woken from sleep.        General Comments General comments (skin integrity, edema, etc.): VSS with changes in position. Pt denies diplopia and blurry vision but seems to have difficulty focusing. Has zygomatic arch fx with some swelling around L eye    Exercises     Assessment/Plan    PT Assessment Patient needs continued PT services  PT Problem List Decreased strength;Decreased activity tolerance;Decreased balance;Decreased mobility;Decreased knowledge of use of DME;Decreased knowledge of precautions       PT Treatment Interventions DME instruction;Gait training;Functional mobility training;Therapeutic activities;Therapeutic exercise;Balance training;Patient/family education    PT Goals (Current goals can be found in the Care Plan section)  Acute Rehab PT Goals Patient Stated Goal: return home PT Goal Formulation: With patient Time For Goal Achievement: 03/08/21 Potential to Achieve Goals: Good    Frequency  Min 4X/week   Barriers to discharge Decreased caregiver support wife currently hospitalized (and he was her caregiver PTA)    Co-evaluation               AM-PAC PT "6 Clicks" Mobility  Outcome Measure Help needed turning from your back to your side while in a flat bed without using bedrails?: None Help needed moving from lying on your back to sitting on the side of a flat bed without using bedrails?: A Little Help needed moving to and from a bed to a chair (including a wheelchair)?: A Little Help needed standing up from a chair using your arms (e.g., wheelchair or bedside chair)?: A Little Help needed to walk in hospital room?: A Little Help needed climbing 3-5 steps with a railing? : A Lot 6 Click Score: 18    End of Session Equipment Utilized During Treatment: Gait belt;Oxygen Activity Tolerance: Patient tolerated treatment well Patient left: in chair;with call bell/phone within reach;with chair alarm set;with nursing/sitter in room Nurse Communication: Mobility status PT Visit Diagnosis: Unsteadiness on feet (R26.81);Muscle weakness (generalized) (M62.81);History of falling (Z91.81)    Time: 8295-6213 PT Time Calculation (min) (ACUTE ONLY): 33 min   Charges:   PT Evaluation $  PT Eval Moderate Complexity: 1 Mod PT Treatments $Therapeutic Activity: 8-22 mins        Leighton Roach, PT  Acute Rehab Services  Pager [phone] Office [place] 02/22/2021, 4:01 PM

## 2021-02-22 NOTE — Progress Notes (Signed)
Paged Neurosurgery regarding pt's SBP greater than 160 and ongoing HA of 8/10. Will continue to monitor.

## 2021-02-22 NOTE — Procedures (Addendum)
Patient Name: [name]  MRN: [id number]  Epilepsy Attending: Lora Havens  Referring Physician/Provider: Salvadore Dom, NP Date: 02/21/2021  Duration: 24.47 mins  Patient history: 74yo M with right SDH. EEG to evaluate for seizure  Level of alertness: comatose  AEDs during EEG study: Propofol  Technical aspects: This EEG study was done with scalp electrodes positioned according to the 10-20 International system of electrode placement. Electrical activity was acquired at a sampling rate of 500Hz  and reviewed with a high frequency filter of 70Hz  and a low frequency filter of 1Hz . EEG data were recorded continuously and digitally stored.   Description: EEG showed continuous generalized and lateralized right hemisphere 3 to 6 Hz theta-delta slowing. There is also overriding 15 to 18 Hz beta activity distributed symmetrically and diffusely. Hyperventilation and photic stimulation were not performed.     ABNORMALITY - Continuous slow, generalized and lateralized right hemisphere - Excessive beta, generalized  IMPRESSION: This study is suggestive of cortical dysfunction in right hemisphere likely due to underlying SDH. There is also moderate to severe diffuse encephalopathy, nonspecific etiology but could be due to sedation. No seizures or epileptiform discharges were seen throughout the recording.  Aryam Zhan Barbra Sarks

## 2021-02-22 NOTE — Progress Notes (Signed)
25mLs of IV Fentanyl wasted in the med room with Teodoro Spray, RN.

## 2021-02-22 NOTE — Progress Notes (Addendum)
Two of pt's 5 sons at the bedside and updated. Pt's wife is still in the hospital at [facility]. Her direct room # is [phone]. She requested we have it in case we need to update her. Eulalie Speights, Rande Brunt, RN

## 2021-02-22 NOTE — Procedures (Signed)
Extubation Procedure Note  Patient Details:   Name: [name] DOB: 27-Aug-1945 MRN: [id number]   Airway Documentation:    Vent end date: 02/22/21 Vent end time: 1000   Evaluation  O2 sats: stable throughout Complications: No apparent complications Patient did tolerate procedure well. Bilateral Breath Sounds: Diminished, Clear   Yes  RT extubated patient to 2L [place] per MD order with RN at bedside. Positive cuff leak noted. Patient tolerated well. No stridor noted at this time. RT will continue to monitor as needed.    Fabiola Backer 02/22/2021, 2:49 PM

## 2021-02-23 ENCOUNTER — Inpatient Hospital Stay (HOSPITAL_COMMUNITY): Payer: PPO

## 2021-02-23 DIAGNOSIS — B9620 Unspecified Escherichia coli [E. coli] as the cause of diseases classified elsewhere: Secondary | ICD-10-CM

## 2021-02-23 DIAGNOSIS — N390 Urinary tract infection, site not specified: Secondary | ICD-10-CM

## 2021-02-23 LAB — BASIC METABOLIC PANEL
Anion gap: 8 (ref 5–15)
BUN: 11 mg/dL (ref 8–23)
CO2: 27 mmol/L (ref 22–32)
Calcium: 8.5 mg/dL — ABNORMAL LOW (ref 8.9–10.3)
Chloride: 101 mmol/L (ref 98–111)
Creatinine, Ser: 0.87 mg/dL (ref 0.61–1.24)
GFR, Estimated: >60 mL/min (ref >60)
Glucose, Bld: 102 mg/dL — ABNORMAL HIGH (ref 70–99)
Potassium: 3.7 mmol/L (ref 3.5–5.1)
Sodium: 136 mmol/L (ref 135–145)

## 2021-02-23 LAB — GLUCOSE, CAPILLARY
Glucose-Capillary: 103 mg/dL — ABNORMAL HIGH (ref 70–99)
Glucose-Capillary: 104 mg/dL — ABNORMAL HIGH (ref 70–99)
Glucose-Capillary: 105 mg/dL — ABNORMAL HIGH (ref 70–99)
Glucose-Capillary: 113 mg/dL — ABNORMAL HIGH (ref 70–99)
Glucose-Capillary: 135 mg/dL — ABNORMAL HIGH (ref 70–99)
Glucose-Capillary: 91 mg/dL (ref 70–99)

## 2021-02-23 LAB — URINE CULTURE: Culture: >=100000 — AB

## 2021-02-23 LAB — MAGNESIUM: Magnesium: 1.8 mg/dL (ref 1.7–2.4)

## 2021-02-23 LAB — PHOSPHORUS: Phosphorus: 2.8 mg/dL (ref 2.5–4.6)

## 2021-02-23 MED ORDER — MAGNESIUM SULFATE 2 GM/50ML IV SOLN
2.0000 g | Freq: Once | INTRAVENOUS | Status: AC
  Administered 2021-02-23: 2 g via INTRAVENOUS
  Filled 2021-02-23: qty 50

## 2021-02-23 MED ORDER — PANTOPRAZOLE SODIUM 40 MG PO TBEC
40.0000 mg | DELAYED_RELEASE_TABLET | Freq: Every day | ORAL | Status: DC
  Administered 2021-02-24 – 2021-02-25 (×2): 40 mg via ORAL
  Filled 2021-02-23 (×2): qty 1

## 2021-02-23 MED ORDER — SODIUM CHLORIDE 0.9 % IV SOLN: INTRAVENOUS | Status: DC

## 2021-02-23 MED ORDER — POLYETHYLENE GLYCOL 3350 17 GM/SCOOP PO POWD
17.0000 g | ORAL | Status: DC | PRN
  Filled 2021-02-23: qty 255

## 2021-02-23 MED ORDER — ONDANSETRON HCL 4 MG/2ML IJ SOLN
INTRAMUSCULAR | Status: AC
  Filled 2021-02-23: qty 2

## 2021-02-23 MED ORDER — POLYETHYLENE GLYCOL 3350 17 G PO PACK: 17.0000 g | PACK | ORAL | Status: DC | PRN

## 2021-02-23 MED ORDER — SACUBITRIL-VALSARTAN 97-103 MG PO TABS
1.0000 | ORAL_TABLET | Freq: Two times a day (BID) | ORAL | Status: DC
  Administered 2021-02-23 – 2021-02-26 (×6): 1 via ORAL
  Filled 2021-02-23 (×7): qty 1

## 2021-02-23 MED ORDER — ONDANSETRON HCL 4 MG/2ML IJ SOLN
4.0000 mg | Freq: Four times a day (QID) | INTRAMUSCULAR | Status: DC | PRN
  Administered 2021-02-23 – 2021-02-25 (×2): 4 mg via INTRAVENOUS
  Filled 2021-02-23: qty 2

## 2021-02-23 NOTE — Progress Notes (Signed)
Patient ID: [name], male   DOB: February 24, 1946, 75 y.o.   MRN: [id number] BP 140/73   Pulse 61   Temp 98.4 F (36.9 C) (Oral)   Resp 18   SpO2 98%  Alert and oriented x 3 Following commands Moving extremities May go to 4np

## 2021-02-23 NOTE — Progress Notes (Addendum)
Inpatient Rehabilitation Admissions Coordinator   Inpatient rehab consult received. I met with patient at bedside who is asleep. I have left a message for his son, [name], to contact me for further discussions. I am also consulting on his wife at [facility] room 147.  Danne Baxter, RN, MSN Rehab Admissions Coordinator [phone] 02/23/2021 12:06 PM   I met at bedside with patient's wife at [facility] room 146. I also spoke with son , [name] per her request by phone. I discussed goals and expectations of a [facility] admit for patient as well as his wife. I feel that both could benefit from a [facility] admit pending family can assist at supervision to min assist level after a CIR admit. The couple can not be expected to be each other's caregiver. [name] will speak with his other 3 brothers to clarify if they can arrange the caregiver supports recommended. I will follow up with [name] and pt's wife tomorrow to clarify caregiver supports before I pursue insurance approval with [employer] with Dr Amalia Hailey. CIR will not make patient independent enough to return home to be his wife's caregiver or assist her with her peritoneal dialysis as he was prior to admit. Please call me with any questions.  Danne Baxter, RN, MSN Rehab Admissions Coordinator [phone] 02/23/2021 5:01 PM

## 2021-02-23 NOTE — Progress Notes (Signed)
Physical Therapy Treatment Patient Details Name: [name] MRN: [id number] DOB: April 03, 1946 Today's Date: 02/23/2021   History of Present Illness Pt admitted from [facility] where he was with his wife in the ED and had a syncopal episode resulting in R SDH. Pt transferred to [facility] for further evaluation due to neuro decline after event. PMH: prostate cancer, CAD, AAA, CVA, HTN, HLD, CHF, [name] OA.    PT Comments    Patient demonstrating cognitive deficits and L inattention this date. Patient requiring minA initially with RW for in room progressing to [place] with fatigue and increasing L inattention. Patient more confused this date than previous date per chart review. Updated discharge recommendation to CIR at discharge to maximize functional independence and safety.     Recommendations for follow up therapy are one component of a multi-disciplinary discharge planning process, led by the attending physician.  Recommendations may be updated based on patient status, additional functional criteria and insurance authorization.  Follow Up Recommendations  Acute inpatient rehab (3hours/day)     Assistance Recommended at Discharge Frequent or constant Supervision/Assistance  Equipment Recommendations  None recommended by PT    Recommendations for Other Services Rehab consult     Precautions / Restrictions Precautions Precautions: Fall Restrictions Weight Bearing Restrictions: No     Mobility  Bed Mobility Overal bed mobility: Needs Assistance Bed Mobility: Supine to Sit     Supine to sit: Min guard     General bed mobility comments: pt exiting the bed on the R side with cues to come to the L side. pt does not initiate any L side attempts. pt sitting EOB on R side impulsive to get up    Transfers Overall transfer level: Needs assistance Equipment used: Rolling Jameil Whitmoyer (2 wheels) Transfers: Sit to/from Stand Sit to Stand: Min assist           General transfer comment: pt  progressed with RW but with poor awareness to the RW    Ambulation/Gait Ambulation/Gait assistance: Min assist;Mod assist Gait Distance (Feet): 40 Feet Assistive device: Rolling Vessie Olmsted (2 wheels) Gait Pattern/deviations: Step-to pattern;Shuffle;Decreased stride length Gait velocity: decreased     General Gait Details: Initially requiring minA for ambulation in the room to find his wife's purse and his phone. With fatigue, patient requiring modA for balance, RW management, and attention to L side. Patient attempting to grasp RW with fist and requires assist to place L hand on RW handle   Stairs             Wheelchair Mobility    Modified Rankin (Stroke Patients Only)       Balance Overall balance assessment: Needs assistance Sitting-balance support: Bilateral upper extremity supported;Feet supported Sitting balance-Leahy Scale: Fair     Standing balance support: Bilateral upper extremity supported;During functional activity;Reliant on assistive device for balance Standing balance-Leahy Scale: Fair                              Cognition Arousal/Alertness: Awake/alert Behavior During Therapy: Restless;Flat affect Overall Cognitive Status: Impaired/Different from baseline Area of Impairment: Orientation;Attention;Memory;Following commands;Safety/judgement;Awareness;Problem solving                 Orientation Level: Disoriented to;Time;Situation Current Attention Level: Sustained Memory: Decreased recall of precautions;Decreased short-term memory Following Commands: Follows multi-step commands inconsistently Safety/Judgement: Decreased awareness of safety;Decreased awareness of deficits Awareness: Intellectual Problem Solving: Slow processing General Comments: pt searching for wife purse throughout the session.  pt looking for phone that is in his hands and reporting he needs to find his phone that is in his hand. pt talking to neighbor and then stops  talking and forgets he is on the phone. pt reports wife is at hospital to neighbor and going home but didnt need any surgery. pt's wife is admitted to [facility] s/p surgery at this time with pending workup. Pt repeating statements during session that have been answered. pt with poor awareness to personal reason for admission then retaining information for about 10 minutes with anemsia to information again        Exercises      General Comments General comments (skin integrity, edema, etc.): VSS      Pertinent Vitals/Pain Pain Assessment: Faces Faces Pain Scale: Hurts even more Pain Location: head Pain Descriptors / Indicators: Headache Pain Intervention(s): Repositioned;Premedicated before session;Monitored during session    [place] expects to be discharged to:: Private residence Living Arrangements: Spouse/significant other Available Help at Discharge: Family;Available 24 hours/day Type of Home: House Home Access: Ramped entrance       Home Layout: One level Home Equipment: Conservation officer, nature (2 wheels);Cane - single point;Shower seat Additional Comments: pt lives with his wife but she just broke her hip for the second time (first time was 2/22). Family lives nearby but they work in the day.    Prior Function            PT Goals (current goals can now be found in the care plan section) Acute Rehab PT Goals PT Goal Formulation: With patient Time For Goal Achievement: 03/08/21 Potential to Achieve Goals: Good Progress towards PT goals: Progressing toward goals    Frequency    Min 4X/week      PT Plan Discharge plan needs to be updated    Co-evaluation PT/OT/SLP Co-Evaluation/Treatment: Yes Reason for Co-Treatment: For patient/therapist safety;To address functional/ADL transfers PT goals addressed during session: Mobility/safety with mobility;Balance;Proper use of DME OT goals addressed during session: Proper use of Adaptive equipment and DME       AM-PAC PT "6 Clicks" Mobility   Outcome Measure  Help needed turning from your back to your side while in a flat bed without using bedrails?: A Little Help needed moving from lying on your back to sitting on the side of a flat bed without using bedrails?: A Little Help needed moving to and from a bed to a chair (including a wheelchair)?: A Lot Help needed standing up from a chair using your arms (e.g., wheelchair or bedside chair)?: A Lot Help needed to walk in hospital room?: A Lot Help needed climbing 3-5 steps with a railing? : A Lot 6 Click Score: 14    End of Session Equipment Utilized During Treatment: Gait belt Activity Tolerance: Patient tolerated treatment well Patient left: in chair;with call bell/phone within reach;with chair alarm set;with nursing/sitter in room Nurse Communication: Mobility status PT Visit Diagnosis: Unsteadiness on feet (R26.81);Muscle weakness (generalized) (M62.81);History of falling (Z91.81)     Time: 5409-8119 PT Time Calculation (min) (ACUTE ONLY): 32 min  Charges:  $Gait Training: 8-22 mins                     Samul Mcinroy A. Gilford Rile PT, DPT Acute Rehabilitation Services Pager [phone] Office [phone]    Linna Hoff 02/23/2021, 11:22 AM

## 2021-02-23 NOTE — Progress Notes (Signed)
Inpatient Rehab Admissions Coordinator:  ? ?Per therapy recommendations,  patient was screened for CIR candidacy by Tinsley Lomas, MS, CCC-SLP. At this time, Pt. Appears to be a a potential candidate for CIR. I will place   order for rehab consult per protocol for full assessment. Please contact me any with questions. ? ?Ole Lafon, MS, CCC-SLP ?Rehab Admissions Coordinator  ?[phone] (celll) ?[phone] (office) ? ?

## 2021-02-23 NOTE — Progress Notes (Signed)
NAME:  [name], MRN:  [id number], DOB:  07/28/1945, LOS: 2 ADMISSION DATE:  02/21/2021, CONSULTATION DATE:  11/7 REFERRING MD:  Christella Noa, CHIEF COMPLAINT:  acute SDH   History of Present Illness:  75 year old male patient with history as below was at [facility] emergency room visiting his wife who is under evaluation there.  Suddenly nursing staff heard wife screaming in the room, on arrival they found the patient unconscious on the floor with epistaxis and urinary incontinence.  Per report he was unconscious for approximately 1 minute, following this he was confused for several minutes then improved.  On initial evaluation by critical care he was awake, oriented x3 without focal deficits and had no slurred speech or dysarthria.  His initial CT of head showed right subdural hematoma C-spine was normal.  While in the emergency room he became progressively more restless and agitated a repeat CT of head showed progression of right subdural hematoma with new right to left shift.  He was evaluated by neurosurgery at [facility] who reported they did not have capacity to assist him and therefore he was transferred to [facility] for neurosurgical evaluation.  He was intubated for airway protection and transferred to [facility] on arrival he was sedated on fentanyl and propofol infusion  Pertinent  Medical History  CHF w ef 30-35% HTN HL prior stroke tobacco abuse prostate cancer CAD AAA   Significant Hospital Events: Including procedures, antibiotic start and stop dates in addition to other pertinent events   11/7 admitted status post syncopal event/fall resulting in right subdural hematoma.  Mental status deteriorated while in ER, repeat CT imaging showed progression of right subdural hematoma transferred to [facility] for neurosurgical evaluation  Interim History / Subjective:  Repeated CT today, no significant changes.  Objective   Blood pressure (!) 167/62, pulse 85,  temperature 98.7 F (37.1 C), temperature source Axillary, resp. rate (!) 23, SpO2 95 %.        Intake/Output Summary (Last 24 hours) at 02/23/2021 0956 Last data filed at 02/23/2021 0600 Gross per 24 hour  Intake 254.12 ml  Output 500 ml  Net -245.88 ml    There were no vitals filed for this visit.  Examination: General: Adult male, hard of hearing, resting in bed, in NAD. Neuro: Sleepy but easily arouses to voice.  Hard of hearing but will answer questions appropriately. HEENT: [place]/AT. Sclerae anicteric. EOMI. Cardiovascular: RRR, no M/R/G.  Lungs: Respirations even and unlabored.  CTA bilaterally, No W/R/R.  Abdomen: BS x 4, soft, NT/ND.  Musculoskeletal: No gross deformities, no edema.  Skin: Intact, warm, no rashes.  Resolved Hospital Problem list   Hypertensive urgency resolved  Assessment & Plan:   Traumatic R SDH - repeat CT stable. Syncope with fall - No sz captured on EEG.  Echo not suggestive of PE  - SBP goal < 160. - No NSGY intervention at this point.  Acute respiratory failure with ineffective airway clearance 2/2 above - s/p intubation prior to transfer to [facility], now s/p extubation 11/8 Hx COPD  Tobacco abuse  - Bronchial hygiene. - Bronchodilators.  - Tobacco cessation counseling.  UTI. - Continue Rocephin. - Follow cx.   Nothing further to add.  PCCM will sign off.  Please call us back if we can be of any further assistance.    Montey Hora, [facility] For pager details, please see AMION or use Epic chat  After 1900, please  call [place] for cross coverage needs 02/23/2021, 10:03 AM

## 2021-02-23 NOTE — Evaluation (Addendum)
Occupational Therapy Evaluation Patient Details Name: [name] MRN: [id number] DOB: Feb 25, 1946 Today's Date: 02/23/2021   History of Present Illness Pt admitted from [facility] where he was with his wife in the ED and had a syncopal episode resulting in R SDH. Pt transferred to [facility] for further evaluation due to neuro decline after event. PMH: prostate cancer, CAD, AAA, CVA, HTN, HLD, CHF, [name] OA.   Clinical Impression   PT admitted with R SDH s/p fall. Pt currently with functional limitiations due to the deficits listed below (see OT problem list). Pt demonstrates cognitive and balance deficits. Pt with L side weakness and slight facial droop. Pt with L inattention.  Pt will benefit from skilled OT to increase their independence and safety with adls and balance to allow discharge CIR.    Wife is at [facility] with IM nail pending placement per notes ( reports name is Olegario Shearer)   Recommendations for follow up therapy are one component of a multi-disciplinary discharge planning process, led by the attending physician.  Recommendations may be updated based on patient status, additional functional criteria and insurance authorization.   Follow Up Recommendations  Acute inpatient rehab (3hours/day)    Assistance Recommended at Discharge    Functional Status Assessment  Patient has had a recent decline in their functional status and demonstrates the ability to make significant improvements in function in a reasonable and predictable amount of time.  Equipment Recommendations       Recommendations for Other Services Rehab consult     Precautions / Restrictions Precautions Precautions: Fall Restrictions Weight Bearing Restrictions: No      Mobility Bed Mobility Overal bed mobility: Needs Assistance Bed Mobility: Supine to Sit     Supine to sit: Min guard     General bed mobility comments: pt exiting the bed on the R side with cues to come to the L side. pt does not initiate any L side  attempts. pt sitting EOB on R side impulsive to get up    Transfers Overall transfer level: Needs assistance Equipment used: Rolling walker (2 wheels) Transfers: Sit to/from Stand Sit to Stand: Min assist           General transfer comment: pt progressed with RW but with poor awareness to the RW      Balance Overall balance assessment: Needs assistance Sitting-balance support: Bilateral upper extremity supported;Feet supported Sitting balance-Leahy Scale: Fair     Standing balance support: Bilateral upper extremity supported;During functional activity;Reliant on assistive device for balance Standing balance-Leahy Scale: Fair                             ADL either performed or assessed with clinical judgement   ADL Overall ADL's : Needs assistance/impaired         Upper Body Bathing: Moderate assistance   Lower Body Bathing: Moderate assistance   Upper Body Dressing : Moderate assistance   Lower Body Dressing: Moderate assistance   Toilet Transfer: Moderate assistance;BSC/3in1;Rolling walker (2 wheels)           Functional mobility during ADLs: Moderate assistance;Rolling walker (2 wheels) General ADL Comments: pt searching room multiple times for wife purse despite being told the purse was not present. pt allowed to search and after not finding it stops and then later in the session starts again.     Vision Baseline Vision/History: 1 Wears glasses Additional Comments: decreased attention to the L visual field but  can scan when cued. pt reports not visual changes at this time. pt able to answer questions when cued for vision correctly in all 4 quadrants     Perception     Praxis      Pertinent Vitals/Pain Pain Assessment: Faces Faces Pain Scale: Hurts even more Pain Location: head Pain Descriptors / Indicators: Headache Pain Intervention(s): Repositioned;Monitored during session;Premedicated before session     Hand Dominance Right    Extremity/Trunk Assessment Upper Extremity Assessment Upper Extremity Assessment: LUE deficits/detail LUE Deficits / Details: decreased grasp, knuckling RW and lacks awareness to hand off RW. pt needs cues to use L hand. pt dropping phone when trying to use L UE LUE Sensation: decreased light touch;decreased proprioception LUE Coordination: decreased fine motor   Lower Extremity Assessment Lower Extremity Assessment: Defer to PT evaluation   Cervical / Trunk Assessment Cervical / Trunk Assessment: Normal   Communication Communication Communication: Expressive difficulties   Cognition Arousal/Alertness: Awake/alert Behavior During Therapy: Restless;Flat affect Overall Cognitive Status: Impaired/Different from baseline Area of Impairment: Orientation;Attention;Memory;Following commands;Safety/judgement;Awareness;Problem solving                 Orientation Level: Disoriented to;Time;Situation Current Attention Level: Sustained Memory: Decreased recall of precautions;Decreased short-term memory Following Commands: Follows multi-step commands inconsistently Safety/Judgement: Decreased awareness of safety;Decreased awareness of deficits Awareness: Intellectual Problem Solving: Slow processing General Comments: pt searching for wife purse throughout the session. pt looking for phone that is in his hands and reporting he needs to find his phone that is in his hand. pt talking to neighbor and then stops talking and forgets he is on the phone. pt reports wife is at hospital to neighbor and going home but didnt need any surgery. pt's wife is admitted to [facility] s/p surgery at this time with pending workup. Pt repeating statements during session that have been answered. pt with poor awareness to personal reason for admission then retaining information for about 10 minutes with anemsia to information again     General Comments  VSS    Exercises     Shoulder Instructions      Home  Living Family/patient expects to be discharged to:: Private residence Living Arrangements: Spouse/significant other Available Help at Discharge: Family;Available 24 hours/day Type of Home: House Home Access: Ramped entrance     Home Layout: One level     Bathroom Shower/Tub: Walk-in shower         Home Equipment: Conservation officer, nature (2 wheels);Cane - single point;Shower seat   Additional Comments: pt lives with his wife but she just broke her hip for the second time (first time was 2/22). Family lives nearby but they work in the day.      Prior Functioning/Environment Prior Level of Function : Independent/Modified Independent;Driving             Mobility Comments: pt uses cane for "safety" per his report          OT Problem List: Decreased strength;Decreased activity tolerance;Impaired balance (sitting and/or standing);Decreased cognition;Decreased safety awareness;Decreased knowledge of use of DME or AE;Decreased knowledge of precautions;Decreased coordination      OT Treatment/Interventions: Self-care/ADL training;Therapeutic exercise;Neuromuscular education;Energy conservation;DME and/or AE instruction;Manual therapy;Therapeutic activities;Cognitive remediation/compensation;Patient/family education;Balance training    OT Goals(Current goals can be found in the care plan section) Acute Rehab OT Goals Patient Stated Goal: telling neighbor he needs to find wife purse and figure out how to get her home today OT Goal Formulation: Patient unable to participate in goal setting Time For Goal Achievement:  03/09/21 Potential to Achieve Goals: Good  OT Frequency: Min 2X/week   Barriers to D/C: Decreased caregiver support  wife is at [facility] s/p IM nail       Co-evaluation PT/OT/SLP Co-Evaluation/Treatment: Yes Reason for Co-Treatment: To address functional/ADL transfers;For patient/therapist safety   OT goals addressed during session: Proper use of Adaptive equipment and DME       AM-PAC OT "6 Clicks" Daily Activity     Outcome Measure Help from another person eating meals?: A Little Help from another person taking care of personal grooming?: A Little Help from another person toileting, which includes using toliet, bedpan, or urinal?: A Little Help from another person bathing (including washing, rinsing, drying)?: A Lot Help from another person to put on and taking off regular upper body clothing?: A Little Help from another person to put on and taking off regular lower body clothing?: A Lot 6 Click Score: 16   End of Session Equipment Utilized During Treatment: Gait belt;Rolling walker (2 wheels) Nurse Communication: Mobility status;Precautions  Activity Tolerance: Patient tolerated treatment well Patient left: in chair;with call bell/phone within reach;with chair alarm set (posey chair)  OT Visit Diagnosis: Unsteadiness on feet (R26.81);Muscle weakness (generalized) (M62.81)                Time: 1610-9604 OT Time Calculation (min): 30 min Charges:  OT General Charges $OT Visit: 1 Visit OT Evaluation $OT Eval Moderate Complexity: 1 Mod   B[place]nn, OTR/L  Acute Rehabilitation Services Pager: [phone] Office: [phone] .   Jeri Modena 02/23/2021, 11:04 AM

## 2021-02-24 DIAGNOSIS — I619 Nontraumatic intracerebral hemorrhage, unspecified: Secondary | ICD-10-CM

## 2021-02-24 LAB — GLUCOSE, CAPILLARY
Glucose-Capillary: 95 mg/dL (ref 70–99)
Glucose-Capillary: 99 mg/dL (ref 70–99)
Glucose-Capillary: 105 mg/dL — ABNORMAL HIGH (ref 70–99)
Glucose-Capillary: 115 mg/dL — ABNORMAL HIGH (ref 70–99)
Glucose-Capillary: 149 mg/dL — ABNORMAL HIGH (ref 70–99)
Glucose-Capillary: 136 mg/dL — ABNORMAL HIGH (ref 70–99)

## 2021-02-24 MED ORDER — NICOTINE 21 MG/24HR TD PT24
21.0000 mg | MEDICATED_PATCH | Freq: Every day | TRANSDERMAL | Status: DC
  Administered 2021-02-24 – 2021-02-26 (×3): 21 mg via TRANSDERMAL
  Filled 2021-02-24 (×3): qty 1

## 2021-02-24 NOTE — Progress Notes (Signed)
Occupational Therapy Treatment Patient Details Name: [name] MRN: [id number] DOB: 1945-10-06 Today's Date: 02/24/2021   History of present illness Pt admitted from [facility] where he was with his wife in the ED and had a syncopal episode resulting in R SDH. Pt transferred to [facility] for further evaluation due to neuro decline after event. PMH: prostate cancer, CAD, AAA, CVA, HTN, HLD, CHF, [name] OA.   OT comments  Pt progressing towards OT goals this session. Improvement in cognition - however deficits remain in awareness, problem solving, especially during functional ADL. Pt with improvement in attention to LUE and Pt able to use more functionally, fatigues quickly though. Please see ADL and cognition sections below. Pt overall min A with RW for in room mobility (was using SPC prior). Pt continues to demonstrate stamina and motivation to participate in 3 hours of intensive therapy,and functionally requires CIR level therapy to return to PLOF. OT will continue to follow acutely.    Recommendations for follow up therapy are one component of a multi-disciplinary discharge planning process, led by the attending physician.  Recommendations may be updated based on patient status, additional functional criteria and insurance authorization.    Follow Up Recommendations  Acute inpatient rehab (3hours/day)    Assistance Recommended at Discharge    Equipment Recommendations       Recommendations for Other Services Rehab consult    Precautions / Restrictions Precautions Precautions: Fall Restrictions Weight Bearing Restrictions: No       Mobility Bed Mobility Overal bed mobility: Needs Assistance Bed Mobility: Supine to Sit     Supine to sit: Min guard     General bed mobility comments: increased time and use of bed rail to assist, vc for each step of bed mobility    Transfers Overall transfer level: Needs assistance Equipment used: Rolling walker (2 wheels) Transfers: Sit to/from  Stand Sit to Stand: Min assist           General transfer comment: pt progressed with RW but with poor safety awareness to the RW     Balance Overall balance assessment: Needs assistance Sitting-balance support: Bilateral upper extremity supported;Feet supported Sitting balance-Leahy Scale: Fair     Standing balance support: Bilateral upper extremity supported;During functional activity;Reliant on assistive device for balance Standing balance-Leahy Scale: Fair Standing balance comment: benefits from BUE support, or leans against sink in static standing                           ADL either performed or assessed with clinical judgement   ADL Overall ADL's : Needs assistance/impaired     Grooming: Wash/dry face;Oral care;Minimal assistance;Standing;Cueing for sequencing;Cueing for safety Grooming Details (indicate cue type and reason): sink level, dependent on SUE for balance able to find objects on the left side today                 Toilet Transfer: Minimal assistance;Ambulation;BSC/3in1;Rolling walker (2 wheels);Grab bars;Cueing for safety;Cueing for sequencing Toilet Transfer Details (indicate cue type and reason): vc for safe hand placement Toileting- Clothing Manipulation and Hygiene: Moderate assistance;Sit to/from stand       Functional mobility during ADLs: Minimal assistance;Rolling walker (2 wheels) General ADL Comments: increased time for all aspects of ADL/transfers    Extremity/Trunk Assessment Upper Extremity Assessment Upper Extremity Assessment: LUE deficits/detail LUE Deficits / Details: LUE improving, better proprioception (able to complete finger to nose and put on toothpaste) deficits still remain LUE Coordination: decreased fine  motor            Vision       Perception     Praxis      Cognition Arousal/Alertness: Awake/alert Behavior During Therapy: WFL for tasks assessed/performed;Flat affect Overall Cognitive Status:  Impaired/Different from baseline Area of Impairment: Safety/judgement;Awareness;Problem solving                       Following Commands: Follows multi-step commands inconsistently Safety/Judgement: Decreased awareness of safety;Decreased awareness of deficits Awareness: Emergent Problem Solving: Slow processing;Requires verbal cues General Comments: improved cognition from yesterday's session but continued decreased awareness of safety and requiring cues for task completion          Exercises     Shoulder Instructions       General Comments Questionable need for O2, poor pleth and reading at 85% via pulse-ox at the end of the session (hands/fingers very cold) 2L applied    Pertinent Vitals/ Pain       Pain Assessment: Faces Faces Pain Scale: Hurts a little bit Pain Location: back (chronic back pain) Pain Descriptors / Indicators: Discomfort Pain Intervention(s): Monitored during session;Repositioned  Home Living                                          Prior Functioning/Environment              Frequency  Min 2X/week        Progress Toward Goals  OT Goals(current goals can now be found in the care plan section)  Progress towards OT goals: Progressing toward goals  Acute Rehab OT Goals Patient Stated Goal: get better for wife OT Goal Formulation: With patient Time For Goal Achievement: 03/09/21 Potential to Achieve Goals: Good  Plan Discharge plan remains appropriate;Frequency remains appropriate    Co-evaluation                 AM-PAC OT "6 Clicks" Daily Activity     Outcome Measure   Help from another person eating meals?: A Little Help from another person taking care of personal grooming?: A Little Help from another person toileting, which includes using toliet, bedpan, or urinal?: A Little Help from another person bathing (including washing, rinsing, drying)?: A Lot Help from another person to put on and taking  off regular upper body clothing?: A Little Help from another person to put on and taking off regular lower body clothing?: A Lot 6 Click Score: 16    End of Session Equipment Utilized During Treatment: Gait belt;Rolling walker (2 wheels)  OT Visit Diagnosis: Unsteadiness on feet (R26.81);Muscle weakness (generalized) (M62.81)   Activity Tolerance Patient tolerated treatment well   Patient Left Other (comment) (walking in hallway with PT)   Nurse Communication Mobility status;Precautions        Time: 1275-1700 OT Time Calculation (min): 20 min  Charges: OT General Charges $OT Visit: 1 Visit OT Treatments $Self Care/Home Management : 8-22 mins  Jesse Sans OTR/L Acute Rehabilitation Services Pager: [phone] Office: [place] 02/24/2021, 12:14 PM

## 2021-02-24 NOTE — Progress Notes (Signed)
NAME:  [name], MRN:  [id number], DOB:  09/17/1945, LOS: 3 ADMISSION DATE:  02/21/2021, CONSULTATION DATE:  11/7 REFERRING MD:  Christella Noa, CHIEF COMPLAINT:  acute SDH   History of Present Illness:  75 year old male patient with history as below was at [facility] emergency room visiting his wife who is under evaluation there.  Suddenly nursing staff heard wife screaming in the room, on arrival they found the patient unconscious on the floor with epistaxis and urinary incontinence.  Per report he was unconscious for approximately 1 minute, following this he was confused for several minutes then improved.  On initial evaluation by critical care he was awake, oriented x3 without focal deficits and had no slurred speech or dysarthria.  His initial CT of head showed right subdural hematoma C-spine was normal.  While in the emergency room he became progressively more restless and agitated a repeat CT of head showed progression of right subdural hematoma with new right to left shift.  He was evaluated by neurosurgery at [facility] who reported they did not have capacity to assist him and therefore he was transferred to [facility] for neurosurgical evaluation.  He was intubated for airway protection and transferred to [facility] on arrival he was sedated on fentanyl and propofol infusion  Pertinent  Medical History  CHF w ef 30-35% HTN HL prior stroke tobacco abuse prostate cancer CAD AAA   Significant Hospital Events: Including procedures, antibiotic start and stop dates in addition to other pertinent events   11/7 admitted status post syncopal event/fall resulting in right subdural hematoma.  Mental status deteriorated while in ER, repeat CT imaging showed progression of right subdural hematoma transferred to [facility] for neurosurgical evaluation  Interim History / Subjective:  No overnight issues, remained afebrile Blood pressure is at goal  Objective   Blood pressure (!)  157/65, pulse 60, temperature 98.1 F (36.7 C), temperature source Oral, resp. rate 14, SpO2 98 %.        Intake/Output Summary (Last 24 hours) at 02/24/2021 1228 Last data filed at 02/24/2021 1100 Gross per 24 hour  Intake 969.03 ml  Output 1025 ml  Net -55.97 ml   There were no vitals filed for this visit.  Examination: General: Adult male, hard of hearing, resting in bed Neuro: Alert, awake, antigravity in all 4 extremities HEENT: [place]/AT. Sclerae anicteric. EOMI. Cardiovascular: RRR, no M/R/G.  Lungs: Respirations even and unlabored.  CTA bilaterally, No W/R/R.  Abdomen: BS x 4, soft, NT/ND.  Musculoskeletal: No gross deformities, no edema.  Skin: Intact, warm, no rashes.  Resolved Hospital Problem list   Hypertensive urgency resolved Hypokalemia  Assessment & Plan:  Traumatic acute R SDH Right parietotemporal intraparenchymal hemorrhage Cerebral edema, improved Repeat CT stable. Syncope with fall - No sz captured on EEG.  Echo not suggestive of PE  SBP goal < 160. No NSGY intervention at this point.  Acute respiratory failure with ineffective airway clearance 2/2 above  Patient is extubated, on room air  Hx COPD  Tobacco dependence Bronchodilators and inhaled steroid Tobacco cessation counseling provided Continue nicotine patch to prevent nicotine withdrawal  Acute UTI with E. coli Continue Rocephin to complete 5 days therapy  Chronic combined systolic/diastolic congestive heart failure Monitor intake and output He looks euvolemic  Uncontrolled hypertension Slowly resume outpatient meds     Jacky Kindle MD [facility] See Amion for pager If no response to pager, please call [phone] until 7pm After 7pm, Please call  E-link [phone]

## 2021-02-24 NOTE — Evaluation (Signed)
Speech Language Pathology Evaluation Patient Details Name: [name] MRN: [id number] DOB: 1945/08/26 Today's Date: 02/24/2021 Time: 1020-1045 SLP Time Calculation (min) (ACUTE ONLY): 25 min  Problem List:  Patient Active Problem List   Diagnosis Date Noted   SDH (subdural hematoma) 02/21/2021   Syncope 02/21/2021   Fall 02/21/2021   Hypertensive urgency 02/21/2021   HTN (hypertension) 02/21/2021   Stroke ([place]) 02/21/2021   Chronic combined systolic and diastolic CHF (congestive heart failure) ([place]) 02/21/2021   Leukocytosis 02/21/2021   Elevated troponin 02/21/2021   Zygomatic arch fracture ([place]) 02/21/2021   UTI (urinary tract infection) 02/21/2021   Sepsis ([place]) 02/21/2021   Other spondylosis with radiculopathy, lumbar region 09/04/2018   Centrilobular emphysema ([place]) 07/19/2018   Dilated cardiomyopathy ([place]) 07/19/2018   Coronary artery disease of native artery of native heart with stable angina pectoris ([place]) 01/20/2018   AAA (abdominal aortic aneurysm) without rupture 10/30/2017   Personal history of tobacco use, presenting hazards to health 08/29/2016   COPD exacerbation ([place]) 04/03/2015   Medicare annual wellness visit, subsequent 05/23/2014   Barrett's esophagus 05/20/2014   Constipation 05/20/2014   Arthritis of hand 05/07/2014   COPD (chronic obstructive pulmonary disease) ([place]) 11/06/2011   Pulmonary nodule 11/06/2011   Tobacco abuse 02/12/2011   Tobacco abuse counseling 02/12/2011   CAD, NATIVE VESSEL 10/18/2008   COLONIC POLYPS 08/05/2008   Hyperlipidemia 08/05/2008   Essential hypertension 08/05/2008   GERD 08/05/2008   Past Medical History:  Past Medical History:  Diagnosis Date   AAA (abdominal aortic aneurysm)    a.  CTA 7/19: measured 4.5 x 5.3 cm and greatest transverse dimensions   Abnormal nuclear stress test    a.  Myoview 2012: anterior wall ischemia with an estimated EF of 26%.  This was a new wall motion abnormality as well as a newly  reduced EF   COPD (chronic obstructive pulmonary disease) (HCC)    Coronary artery disease    a.  Posterior MI in 2002 status post BMS to the LCx; b. [place] 2012: 95% stenosis of the proximal LAD, 95% stenosis of the mid LAD, 30% in-stent restenosis of the mid left circumflex with a second lesion of diffuse 50% stenosis.  The patient underwent successful PCI/BMS to the mid LAD with 0% residual stenosis, LV gram not performed   GERD (gastroesophageal reflux disease)    History of kidney stones    History of prostate cancer    Hyperlipidemia    Hypertension    Myocardial infarction [facility])    Neuromuscular disorder ([place])    Prostate cancer ([place])    a.  Status post seeding   PVD (peripheral vascular disease) ([place])    Stroke [facility])    Past Surgical History:  Past Surgical History:  Procedure Laterality Date   BACK SURGERY  2009/2010   x 2   CARDIAC CATHETERIZATION  2002   stent placement [place]   ENDOVASCULAR REPAIR/STENT GRAFT N/A 01/02/2018   Procedure: ENDOVASCULAR REPAIR/STENT GRAFT;  Surgeon: Algernon Huxley, MD;  Location: [place] [facility];  Service: Cardiovascular;  Laterality: N/A;   FOOT SURGERY     bilateral    HERNIA REPAIR     bilateral    KNEE SURGERY     right knee    PROSTATE SURGERY  09/2009   prostate implant   SPINE SURGERY     HPI:  Mr. Cupps was with his wife in the [place] ED when while in the room he fell and struck  his head on 01/21/21. LOC approximately 1 minute. CT scan performed, Neurosurgery consult requested. No seizures witnessed. Neurological exam at that time 0827 11/7, revealed a gentleman Alert and oriented x2, confused speech clear, following all commands, normal strength. Head CT showed small subdural hematoma.  He was hypertensive, treated with nicardipine.   Repeat Head CT showed increased subdural hematoma on the right side, increased agitation. Unsure why, but neurosurgeon then recommended transfer to [facility] for higher level of care.    Patient arrived intubated(due to transport), and sedated. Extubated 02/22/21.  SLE generated to assess speech/language and cognition.   Assessment / Plan / Recommendation Clinical Impression  Pt assessed via the SLUMS ([place] Mental Status Examination) with the following score elicited: 65/78 with deficits noted within memory, specifically delayed recall of items, paragraph retention/questions, various impaired attention tasks including simple calculation and digit recall backwards.  Perseveration noted with clock formation indicating deficits in organization/executive function.  Pt was able to recall objects with verbal categorization cues, but only recalled 2/5 without cueing provided.  Pt oriented x4, which was an improvement from prior day per nursing/therapists reports.  Speech intelligible within simple conversation despite slight left facial asymmetry noted during oral mechanism examination.  Pt's wife is hospitalized at [facility] and he is concerned about her as he is the primary caregiver at home.  Recommend ST f/u while in acute setting and pt may be a candidate for CIR for short-term stay to improve overall cognitive/linguistic function.  Thank you for this consult.    SLP Assessment  SLP Recommendation/Assessment: Patient needs continued Speech Language Pathology Services SLP Visit Diagnosis: Attention and concentration deficit;Cognitive communication deficit (R41.841)    Recommendations for follow up therapy are one component of a multi-disciplinary discharge planning process, led by the attending physician.  Recommendations may be updated based on patient status, additional functional criteria and insurance authorization.    Follow Up Recommendations  Acute inpatient rehab (3hours/day)    Assistance Recommended at Discharge  Frequent or constant Supervision/Assistance  Functional Status Assessment Patient has had a recent decline in their functional status and  demonstrates the ability to make significant improvements in function in a reasonable and predictable amount of time.  Frequency and Duration min 2x/week  1 week      SLP Evaluation Cognition  Overall Cognitive Status: Impaired/Different from baseline Arousal/Alertness: Awake/alert Orientation Level: Oriented X4 Year: 2022 Month: November Day of Week: Incorrect Attention: Sustained Sustained Attention: Impaired Sustained Attention Impairment: Verbal basic;Functional basic Memory: Impaired Memory Impairment: Decreased short term memory;Decreased recall of new information;Retrieval deficit Decreased Short Term Memory: Verbal basic;Functional basic Immediate Memory Recall: Sock;Blue;Bed Memory Recall Sock: Without Cue Memory Recall Blue: Without Cue Memory Recall Bed: With Cue Awareness: Impaired Awareness Impairment: Other (comment);Intellectual impairment (reported confusion yesterday, but improving) Behaviors: Perseveration Safety/Judgment: Impaired       Comprehension  Auditory Comprehension Overall Auditory Comprehension: Impaired Yes/No Questions: Within Functional Limits Commands: Impaired Multistep Basic Commands: 50-74% accurate Conversation: Complex Interfering Components: Attention;Working Field seismologist: Consulting civil engineer Discrimination: Not tested Reading Comprehension Reading Status: Not tested    Expression Expression Primary Mode of Expression: Verbal Verbal Expression Overall Verbal Expression: Appears within functional limits for tasks assessed Initiation: No impairment Level of Generative/Spontaneous Verbalization: Conversation Repetition: No impairment Naming: No impairment Interfering Components: Attention Non-Verbal Means of Communication: Not applicable Written Expression Dominant Hand: Right Written Expression: Not tested   Oral / Motor  Oral  Motor/Sensory Function Overall Oral  Motor/Sensory Function: Mild impairment Facial ROM: Reduced left;Other (Comment) (slight) Facial Symmetry: Abnormal symmetry left;Other (Comment) (slight) Facial Strength: Within Functional Limits Lingual ROM: Within Functional Limits Lingual Symmetry: Within Functional Limits Lingual Strength: Within Functional Limits Motor Speech Overall Motor Speech: Appears within functional limits for tasks assessed Respiration: Within functional limits Phonation: Normal Resonance: Within functional limits Articulation: Within functional limitis Intelligibility: Intelligible Motor Planning: Witnin functional limits Motor Speech Errors: Not applicable                       Elvina Sidle, M.S., CCC-SLP 02/24/2021, 12:27 PM

## 2021-02-24 NOTE — Progress Notes (Signed)
Physical Therapy Treatment Patient Details Name: [name] MRN: [id number] DOB: 10/04/1945 Today's Date: 02/24/2021   History of Present Illness Pt admitted from [facility] where he was with his wife in the ED and had a syncopal episode resulting in R SDH. Pt transferred to [facility] for further evaluation due to neuro decline after event. PMH: prostate cancer, CAD, AAA, CVA, HTN, HLD, CHF, [name] OA.    PT Comments    Patient with improved cognition this session and improved awareness. Patient requires up to [facility] for ambulation due to max cueing for safety, RW proximity, and assist for balance. Patient continues to demonstrate cognitive deficits impacting his ability to return home and care for wife independently. Continue to recommend CIR level therapies to assist with maximizing functional mobility and safety.     Recommendations for follow up therapy are one component of a multi-disciplinary discharge planning process, led by the attending physician.  Recommendations may be updated based on patient status, additional functional criteria and insurance authorization.  Follow Up Recommendations  Acute inpatient rehab (3hours/day)     Assistance Recommended at Discharge Frequent or constant Supervision/Assistance  Equipment Recommendations  None recommended by PT    Recommendations for Other Services       Precautions / Restrictions Precautions Precautions: Fall Restrictions Weight Bearing Restrictions: No     Mobility  Bed Mobility Overal bed mobility: Needs Assistance Bed Mobility: Supine to Sit     Supine to sit: Min guard     General bed mobility comments: increased time and use of bed rail to assist, vc for each step of bed mobility    Transfers Overall transfer level: Needs assistance Equipment used: Rolling Damaree Sargent (2 wheels) Transfers: Sit to/from Stand Sit to Stand: Min assist           General transfer comment: pt progressed with RW but with poor safety  awareness to the RW    Ambulation/Gait Ambulation/Gait assistance: Min assist;Mod assist Gait Distance (Feet): 150 Feet Assistive device: Rolling Eden Toohey (2 wheels) Gait Pattern/deviations: Step-to pattern;Decreased stride length;Shuffle;Trunk flexed;Wide base of support Gait velocity: decreased     General Gait Details: requiring minA initially with in room mobility. ModA with hallway distance for balance, RW management and max cues for close proximity with RW and upright posture. Very slow gait speed. Cues for increasing gait speed with poor follow through   Stairs             Wheelchair Mobility    Modified Rankin (Stroke Patients Only)       Balance Overall balance assessment: Needs assistance Sitting-balance support: Bilateral upper extremity supported;Feet supported Sitting balance-Leahy Scale: Fair     Standing balance support: Bilateral upper extremity supported;During functional activity;Reliant on assistive device for balance Standing balance-Leahy Scale: Fair Standing balance comment: benefits from BUE support, or leans against sink in static standing                            Cognition Arousal/Alertness: Awake/alert Behavior During Therapy: WFL for tasks assessed/performed;Flat affect Overall Cognitive Status: Impaired/Different from baseline Area of Impairment: Safety/judgement;Awareness;Problem solving                       Following Commands: Follows multi-step commands inconsistently Safety/Judgement: Decreased awareness of safety;Decreased awareness of deficits Awareness: Emergent Problem Solving: Slow processing;Requires verbal cues General Comments: improved cognition from yesterday's session but continued decreased awareness of safety and requiring cues  for task completion Functional Status Assessment: Patient has had a recent decline in their functional status and demonstrates the ability to make significant improvements in  function in a reasonable and predictable amount of time.      Exercises      General Comments General comments (skin integrity, edema, etc.): Questionable need for O2, poor pleth and reading at 85% via pulse-ox at the end of the session (hands/fingers very cold) 2L applied      Pertinent Vitals/Pain Pain Assessment: Faces Faces Pain Scale: Hurts a little bit Pain Location: back (chronic back pain) Pain Descriptors / Indicators: Discomfort Pain Intervention(s): Monitored during session;Repositioned    Home Living     Available Help at Discharge: Family                    Prior Function            PT Goals (current goals can now be found in the care plan section) Acute Rehab PT Goals Patient Stated Goal: return home PT Goal Formulation: With patient Time For Goal Achievement: 03/08/21 Potential to Achieve Goals: Good Progress towards PT goals: Progressing toward goals    Frequency    Min 4X/week      PT Plan Current plan remains appropriate    Co-evaluation              AM-PAC PT "6 Clicks" Mobility   Outcome Measure  Help needed turning from your back to your side while in a flat bed without using bedrails?: A Little Help needed moving from lying on your back to sitting on the side of a flat bed without using bedrails?: A Little Help needed moving to and from a bed to a chair (including a wheelchair)?: A Little Help needed standing up from a chair using your arms (e.g., wheelchair or bedside chair)?: A Lot Help needed to walk in hospital room?: A Lot Help needed climbing 3-5 steps with a railing? : A Lot 6 Click Score: 15    End of Session Equipment Utilized During Treatment: Gait belt Activity Tolerance: Patient tolerated treatment well Patient left: in bed;with call bell/phone within reach (new bed, being taken to new room on 4NP) Nurse Communication: Mobility status PT Visit Diagnosis: Unsteadiness on feet (R26.81);Muscle weakness  (generalized) (M62.81);History of falling (Z91.81)     Time: 1125-1150 PT Time Calculation (min) (ACUTE ONLY): 25 min  Charges:  $Gait Training: 23-37 mins                     Rhanda Lemire A. Gilford Rile PT, DPT Acute Rehabilitation Services Pager [phone] Office [phone]    Linna Hoff 02/24/2021, 2:26 PM

## 2021-02-24 NOTE — Progress Notes (Signed)
Patient ID: [name], male   DOB: 07-Dec-1945, 75 y.o.   MRN: [id number] BP 131/62 (BP Location: Left Arm)   Pulse (!) 52   Temp 97.8 F (36.6 C) (Oral)   Resp 19   SpO2 100%  Lethargic, easily arouses to voice Moving all extremities Follows commands Rehab now interested in placement improving

## 2021-02-24 NOTE — Progress Notes (Signed)
Inpatient Rehabilitation Admissions Coordinator   I spoke with pt's son, Raquel Sarna by phone as well as his wife at [facility]. Family can provide the projected caregiver support for the couple after a CIR admit. I will begin Auth with [employer] and I have contacted Dr Amalia Hailey to discuss. I will follow up once insurance determination has been made.  Danne Baxter, RN, MSN Rehab Admissions Coordinator [phone] 02/24/2021 3:50 PM

## 2021-02-25 LAB — GLUCOSE, CAPILLARY
Glucose-Capillary: 110 mg/dL — ABNORMAL HIGH (ref 70–99)
Glucose-Capillary: 111 mg/dL — ABNORMAL HIGH (ref 70–99)
Glucose-Capillary: 114 mg/dL — ABNORMAL HIGH (ref 70–99)
Glucose-Capillary: 115 mg/dL — ABNORMAL HIGH (ref 70–99)
Glucose-Capillary: 118 mg/dL — ABNORMAL HIGH (ref 70–99)
Glucose-Capillary: 147 mg/dL — ABNORMAL HIGH (ref 70–99)

## 2021-02-25 NOTE — Progress Notes (Signed)
Inpatient Rehabilitation Admissions Coordinator   I have received approval for CIR from [employer]. Patient, his son, Hilliard Clark and his wife at [facility] room 147 are aware. Both pt and spouse approved for CIR. I await bed availability over the next few days to admit. Acute team and TOC made aware.  Danne Baxter, RN, MSN Rehab Admissions Coordinator [phone] 02/25/2021 2:29 PM

## 2021-02-25 NOTE — Progress Notes (Signed)
Physical Therapy Treatment Patient Details Name: [name] MRN: [id number] DOB: [birth year] Today's Date: 02/25/2021   History of Present Illness Pt admitted from [facility] where he was with his wife in the ED and had a syncopal episode resulting in R SDH. Pt transferred to [facility] for further evaluation due to neuro decline after event. PMH: prostate cancer, CAD, AAA, CVA, HTN, HLD, CHF, [name] OA.    PT Comments    Patient's cognition continues to improve, however deficits remain with attention, memory, awareness, and problem solving. Patient requires minA for ambulation with Rw but demos very slow gait speed putting him at high risk for falls. Continues to be unsafe to return home and be primary caregiver for wife. Continue to recommend CIR level therapies to assist with maximizing functional mobility and safety.    Recommendations for follow up therapy are one component of a multi-disciplinary discharge planning process, led by the attending physician.  Recommendations may be updated based on patient status, additional functional criteria and insurance authorization.  Follow Up Recommendations  Acute inpatient rehab (3hours/day)     Assistance Recommended at Discharge Frequent or constant Supervision/Assistance  Equipment Recommendations  None recommended by PT    Recommendations for Other Services       Precautions / Restrictions Precautions Precautions: Fall Restrictions Weight Bearing Restrictions: No     Mobility  Bed Mobility Overal bed mobility: Needs Assistance Bed Mobility: Supine to Sit;Sit to Supine     Supine to sit: Min assist Sit to supine: Supervision   General bed mobility comments: minA for trunk elevation    Transfers Overall transfer level: Needs assistance Equipment used: Rolling Sofie Schendel (2 wheels) Transfers: Sit to/from Stand Sit to Stand: Min assist           General transfer comment: minA for boost up into standing from low bed surface     Ambulation/Gait Ambulation/Gait assistance: Min assist Gait Distance (Feet): 150 Feet Assistive device: Rolling Rossi Silvestro (2 wheels) Gait Pattern/deviations: Step-to pattern;Decreased stride length;Shuffle;Trunk flexed;Wide base of support Gait velocity: decreased     General Gait Details: very slow gait speed. Cues to increase speed with intermittent follow through. Cues required for close proximity throughout. Difficulty wayfinding back to room. Assist for balance, wayfinding, and RW management   Stairs             Wheelchair Mobility    Modified Rankin (Stroke Patients Only)       Balance Overall balance assessment: Needs assistance Sitting-balance support: Bilateral upper extremity supported;Feet supported Sitting balance-Leahy Scale: Fair     Standing balance support: Bilateral upper extremity supported;During functional activity;Reliant on assistive device for balance Standing balance-Leahy Scale: Poor                              Cognition Arousal/Alertness: Awake/alert Behavior During Therapy: WFL for tasks assessed/performed;Flat affect Overall Cognitive Status: Impaired/Different from baseline Area of Impairment: Safety/judgement;Awareness;Problem solving                         Safety/Judgement: Decreased awareness of safety;Decreased awareness of deficits Awareness: Emergent Problem Solving: Slow processing;Requires verbal cues General Comments: continues to require cues for attention and memory        Exercises      General Comments General comments (skin integrity, edema, etc.): Ambulated on RA with spO2 >95% throughout      Pertinent Vitals/Pain Pain Assessment: Faces Faces Pain  Scale: Hurts a little bit Pain Location: back (chronic back pain) Pain Descriptors / Indicators: Discomfort Pain Intervention(s): Monitored during session;Repositioned    Home Living                          Prior Function             PT Goals (current goals can now be found in the care plan section) Acute Rehab PT Goals Patient Stated Goal: see my wife at rehab PT Goal Formulation: With patient Time For Goal Achievement: 03/08/21 Potential to Achieve Goals: Good Progress towards PT goals: Progressing toward goals    Frequency    Min 4X/week      PT Plan Current plan remains appropriate    Co-evaluation              AM-PAC PT "6 Clicks" Mobility   Outcome Measure  Help needed turning from your back to your side while in a flat bed without using bedrails?: A Little Help needed moving from lying on your back to sitting on the side of a flat bed without using bedrails?: A Little Help needed moving to and from a bed to a chair (including a wheelchair)?: A Little Help needed standing up from a chair using your arms (e.g., wheelchair or bedside chair)?: A Lot Help needed to walk in hospital room?: A Lot Help needed climbing 3-5 steps with a railing? : A Lot 6 Click Score: 15    End of Session Equipment Utilized During Treatment: Gait belt Activity Tolerance: Patient tolerated treatment well Patient left: in bed;with call bell/phone within reach;with bed alarm set Nurse Communication: Mobility status PT Visit Diagnosis: Unsteadiness on feet (R26.81);Muscle weakness (generalized) (M62.81);History of falling (Z91.81)     Time: 2876-8115 PT Time Calculation (min) (ACUTE ONLY): 26 min  Charges:  $Gait Training: 23-37 mins                     [name] PT, DPT Acute Rehabilitation Services Pager [phone] Office [phone]    [name] 02/25/2021, 5:32 PM

## 2021-02-25 NOTE — Progress Notes (Signed)
Inpatient Rehabilitation Admissions Coordinator   I await insurance determination for CIR admit as well as bed availability. I met with patient at bedside with his son, Maudie Mercury and spoke with his wife at [facility] room 147 and she is aware.  Discussed with Dr Christella Noa.   Danne Baxter, RN, MSN Rehab Admissions Coordinator [phone] 02/25/2021 12:00 PM

## 2021-02-25 NOTE — PMR Pre-admission (Signed)
PMR Admission Coordinator Pre-Admission Assessment   Patient: [name] is an 75 y.o., male MRN: [id number] DOB: 09/05/1945 Height:   Weight:   Insurance Information HMO:     PPO:      PCP:      IPA:      80/20:      OTHER:  PRIMARY: [employer]      Policy#: [id number]      Subscriber: pt CM Name: Connie      Phone#: [phone]     Fax#: [phone] Pre-Cert#: [id number] approved for 7 days      Employer:  Benefits:  Phone #: [phone]     Name: 11/10 Eff. Date: 04/17/2018     Deduct: none      Out of Pocket Max: $3450      Life Max: none CIR: $325 co pay per day days 1 until 6      SNF: no copay days 1 until 20; $184 co pay per day days 21 until 100 Outpatient: $15 per visit     Co-Pay: visits per medical neccesity Home Health: 80%      Co-Pay: visits per medical neccesity DME: 80%     Co-Pay: 20% Providers: in network  SECONDARY: none   Financial Counselor:       Phone#:    The "Data Collection Information Summary" for patients in Inpatient Rehabilitation Facilities with attached "Privacy Act Statement-Health Care Records" was provided and verbally reviewed with: Patient and Family   Emergency Contact Information Contact Information       Name Relation Home Work Mobile    Raborn,Vickie Spouse [phone]   [phone]    Oliver,Shawn Son     [phone]    Yam,Lee Son     [phone]         Current Medical History  Patient Admitting Diagnosis: SDH   History of Present Illness: 75 year old male with medical history of CHF with EF 30 to 35%, HTN, HLD, CVA, GERD, COPD, PVD, prostate cancer s/p XRT seeding, CAD, AAA, tobacco abuse, Barrett's esophagus who presented on 02/21/21 with syncope and a fall. Patient was in the ED at [facility] where his wife was a patient. Staff suddenly heard wife screaming and found patient unconscious on the floor with epistaxis and urinary incontinence. No seizure activity. Confused when regained consciousness and mildly  lethargic. BP 233/141 and nicardipine drip started .    CT scan performed showing small SDH.  Neurosurgery consulted. [facility] neurosurgeon recommended transfer to [facility] for higher level of care. Patient arrived to [facility] intubated for airway protection from [facility]. Dr Cabbell consulted at [facility] and felt no operative indication  needed. Neurology consulted with EEG suggestive of cortical dysfunction in right hemisphere likely due to underlying SDH. Also moderate to severe diffuse encephalopathy. Nonspecific etiology but could be due to sedation. No seizures or epileptiform discharges seen. Extubated on 11/8. Acute UTI due to E. Coli  treated with IV Ceftriaxone for 5 days.  Repeat CT showed stability of SDH and Intraparenchymal hemorrhage. Cerebral edema resolved. Holding antiplatelet and anticoagulation.   Patient's medical record from [facility] [place] and [facility] has been reviewed by the rehabilitation admission coordinator and physician.   Past Medical History      Past Medical History:  Diagnosis Date   AAA (abdominal aortic aneurysm)      a.  CTA 7/19: measured 4.5 x 5.3 cm and greatest transverse dimensions   Abnormal nuclear stress test        a.  Myoview 2012: anterior wall ischemia with an estimated EF of 26%.  This was a new wall motion abnormality as well as a newly reduced EF   COPD (chronic obstructive pulmonary disease) (HCC)     Coronary artery disease      a.  Posterior MI in 2002 status post BMS to the LCx; b. LHC 2012: 95% stenosis of the proximal LAD, 95% stenosis of the mid LAD, 30% in-stent restenosis of the mid left circumflex with a second lesion of diffuse 50% stenosis.  The patient underwent successful PCI/BMS to the mid LAD with 0% residual stenosis, LV gram not performed   GERD (gastroesophageal reflux disease)     History of kidney stones     History of prostate cancer     Hyperlipidemia     Hypertension     Myocardial infarction (HCC)     Neuromuscular disorder (HCC)      Prostate cancer (HCC)      a.  Status post seeding   PVD (peripheral vascular disease) (HCC)     Stroke (HCC)      Has the patient had major surgery during 100 days prior to admission? No   Family History   family history includes Diabetes in his mother; Heart attack in his mother; Heart disease in his father; Lung cancer in his daughter.   Current Medications   Current Facility-Administered Medications:    0.9 %  sodium chloride infusion, , Intravenous, Continuous, Cabbell, Kyle, MD, Last Rate: 50 mL/hr at 02/24/21 1328, New Bag at 02/24/21 1328   albuterol (PROVENTIL) (2.5 MG/3ML) 0.083% nebulizer solution 2.5 mg, 2.5 mg, Nebulization, Q6H PRN, Cabbell, Kyle, MD, 2.5 mg at 02/24/21 2004   arformoterol (BROVANA) nebulizer solution 15 mcg, 15 mcg, Nebulization, BID, Babcock, Peter E, NP, 15 mcg at 02/26/21 0901   budesonide (PULMICORT) nebulizer solution 0.5 mg, 0.5 mg, Nebulization, BID, Babcock, Peter E, NP, 0.5 mg at 02/26/21 0902   carvedilol (COREG) tablet 3.125 mg, 3.125 mg, Oral, BID WC, Cabbell, Kyle, MD, 3.125 mg at 02/26/21 0900   docusate sodium (COLACE) capsule 100 mg, 100 mg, Oral, BID, Cabbell, Kyle, MD, 100 mg at 02/26/21 0859   hydrALAZINE (APRESOLINE) injection 10 mg, 10 mg, Intravenous, Q4H PRN, Bowser, Grace E, NP, 10 mg at 02/26/21 0415   HYDROcodone-acetaminophen (NORCO/VICODIN) 5-325 MG per tablet 1-2 tablet, 1-2 tablet, Oral, Q4H PRN, Cabbell, Kyle, MD, 2 tablet at 02/26/21 0900   nicotine (NICODERM CQ - dosed in mg/24 hours) patch 21 mg, 21 mg, Transdermal, Daily, Chand, Sudham, MD, 21 mg at 02/26/21 0902   ondansetron (ZOFRAN) injection 4 mg, 4 mg, Intravenous, Q6H PRN, Desai, Rahul P, PA-C, 4 mg at 02/25/21 0126   pantoprazole (PROTONIX) EC tablet 40 mg, 40 mg, Oral, QHS, Cabbell, Kyle, MD, 40 mg at 02/25/21 2152   polyethylene glycol (MIRALAX / GLYCOLAX) packet 17 g, 17 g, Oral, PRN, Cabbell, Kyle, MD   sacubitril-valsartan (ENTRESTO) 97-103 mg per tablet, 1  tablet, Oral, BID, Chand, Sudham, MD, 1 tablet at 02/26/21 0900   Patients Current Diet:  Diet Order                  Diet Heart Room service appropriate? Yes; Fluid consistency: Thin  Diet effective now                       Precautions / Restrictions Precautions Precautions: Fall Restrictions Weight Bearing Restrictions: No    Has the patient had   2 or more falls or a fall with injury in the past year? Yes   Prior Activity Level Community (5-7x/wk): Independent; driving, caregiver for his wife. Patient cares for his wife who is on peritoneal dialysis. 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    Prior Functional Level Self Care: Did the patient need help bathing, dressing, using the toilet or eating? Independent   Indoor Mobility: Did the patient need assistance with walking from room to room (with or without device)? Independent   Stairs: Did the patient need assistance with internal or external stairs (with or without device)? Independent   Functional Cognition: Did the patient need help planning regular tasks such as shopping or remembering to take medications? Independent   Patient Information Are you of Hispanic, Latino/a,or Spanish origin?: A. No, not of Hispanic, Latino/a, or Spanish origin What is your race?: A. White Do you need or want an interpreter to communicate with a doctor or health care staff?: 0. No   Patient's Response To:  Health Literacy and Transportation Is the patient able to respond to health literacy and transportation needs?: Yes Health Literacy - How often do you need to have someone help you when you read instructions, pamphlets, or other written material from your doctor or pharmacy?: Never In the past 12 months, has lack of transportation kept you from medical appointments or from getting medications?: No In the past 12 months, has lack of transportation kept you from meetings, work, or from  getting things needed for daily living?: No   Home Assistive Devices / Equipment Home Equipment: Rolling Walker (2 wheels), Cane - single point, Shower seat   Prior Device Use: Indicate devices/aids used by the patient prior to current illness, exacerbation or injury?  Cane at times   Current Functional Level Cognition   Arousal/Alertness: Awake/alert Overall Cognitive Status: Impaired/Different from baseline Current Attention Level: Sustained Orientation Level: Oriented to person, Oriented to place, Oriented to time, Disoriented to situation Following Commands: Follows multi-step commands inconsistently Safety/Judgement: Decreased awareness of safety, Decreased awareness of deficits General Comments: continues to require cues for attention and memory Attention: Sustained Sustained Attention: Impaired Sustained Attention Impairment: Verbal basic, Functional basic Memory: Impaired Memory Impairment: Decreased short term memory, Decreased recall of new information, Retrieval deficit Decreased Short Term Memory: Verbal basic, Functional basic Awareness: Impaired Awareness Impairment: Other (comment), Intellectual impairment (reported confusion yesterday, but improving) Behaviors: Perseveration Safety/Judgment: Impaired    Extremity Assessment (includes Sensation/Coordination)   Upper Extremity Assessment: LUE deficits/detail LUE Deficits / Details: LUE improving, better proprioception (able to complete finger to nose and put on toothpaste) deficits still remain LUE Sensation: decreased light touch, decreased proprioception LUE Coordination: decreased fine motor  Lower Extremity Assessment: Defer to PT evaluation     ADLs   Overall ADL's : Needs assistance/impaired Grooming: Wash/dry face, Oral care, Minimal assistance, Standing, Cueing for sequencing, Cueing for safety Grooming Details (indicate cue type and reason): sink level, dependent on SUE for balance able to find objects on  the left side today Upper Body Bathing: Moderate assistance Lower Body Bathing: Moderate assistance Upper Body Dressing : Moderate assistance Lower Body Dressing: Moderate assistance Toilet Transfer: Minimal assistance, Ambulation, BSC/3in1, Rolling walker (2 wheels), Grab bars, Cueing for safety, Cueing for sequencing Toilet Transfer Details (indicate cue type and reason): vc for safe hand placement Toileting- Clothing Manipulation and Hygiene: Moderate assistance, Sit to/from stand Functional mobility during ADLs: Minimal assistance, Rolling walker (2   wheels) General ADL Comments: increased time for all aspects of ADL/transfers     Mobility   Overal bed mobility: Needs Assistance Bed Mobility: Supine to Sit, Sit to Supine Supine to sit: Min assist Sit to supine: Supervision General bed mobility comments: minA for trunk elevation     Transfers   Overall transfer level: Needs assistance Equipment used: Rolling walker (2 wheels) Transfers: Sit to/from Stand Sit to Stand: Min assist General transfer comment: minA for boost up into standing from low bed surface     Ambulation / Gait / Stairs / Wheelchair Mobility   Ambulation/Gait Ambulation/Gait assistance: Min assist Gait Distance (Feet): 150 Feet Assistive device: Rolling walker (2 wheels) Gait Pattern/deviations: Step-to pattern, Decreased stride length, Shuffle, Trunk flexed, Wide base of support General Gait Details: very slow gait speed. Cues to increase speed with intermittent follow through. Cues required for close proximity throughout. Difficulty wayfinding back to room. Assist for balance, wayfinding, and RW management Gait velocity: decreased Gait velocity interpretation: <1.31 ft/sec, indicative of household ambulator     Posture / Balance Balance Overall balance assessment: Needs assistance Sitting-balance support: Bilateral upper extremity supported, Feet supported Sitting balance-Leahy Scale: Fair Standing balance  support: Bilateral upper extremity supported, During functional activity, Reliant on assistive device for balance Standing balance-Leahy Scale: Poor Standing balance comment: benefits from BUE support, or leans against sink in static standing     Special needs/care consideration Fall precautions    Previous Home Environment  Living Arrangements: Spouse/significant other  Lives With: Spouse Available Help at Discharge: Family, Available 24 hours/day (children, spouses and grandchildren to provide 24/7 care of couple at discharge) Type of Home: House Home Layout: One level Home Access: Ramped entrance Bathroom Shower/Tub: Walk-in shower Bathroom Toilet: Standard Bathroom Accessibility: Yes How Accessible: Accessible via walker Home Care Services: No Additional Comments: pt lives with his wife but she just broke her hip for the second time (first time was 2/22). Family lives nearby but they work in the day.   Discharge Living Setting Plans for Discharge Living Setting: Patient's home, Lives with (comment) (spouse) Type of Home at Discharge: House Discharge Home Layout: One level Discharge Home Access: Ramped entrance Discharge Bathroom Shower/Tub: Walk-in shower Discharge Bathroom Toilet: Standard Discharge Bathroom Accessibility: Yes How Accessible: Accessible via walker Does the patient have any problems obtaining your medications?: No   Social/Family/Support Systems Patient Roles: Caregiver, Parent, Spouse Contact Information: spouse, Vickie and son, Sean are main 2 contacts Anticipated Caregiver: sons and their families are rotating to proivide 24/7 min asisst of the couple after CIR admit Anticipated Caregiver's Contact Information: see contacts Ability/Limitations of Caregiver: children work, but will arrange rotating schedules Caregiver Availability: 24/7 Discharge Plan Discussed with Primary Caregiver: Yes Is Caregiver In Agreement with Plan?: Yes Does Caregiver/Family  have Issues with Lodging/Transportation while Pt is in Rehab?: No   Goals Patient/Family Goal for Rehab: Mod I to supervision with PT, OT and SLP Expected length of stay: ELOS 10 to 12 days Pt/Family Agrees to Admission and willing to participate: Yes Program Orientation Provided & Reviewed with Pt/Caregiver Including Roles  & Responsibilities: Yes   His spouse, Vickie, at [facility] to also be admitted to [facility] CIR when bed is available.    Decrease burden of Care through IP rehab admission: n/a   Possible need for SNF placement upon discharge: not anticipated   Patient Condition: I have reviewed medical records from [facility], spoken with CM, and patient, spouse, and son. I met with patient   management, and patient education the patient requires 24 hour a day rehabilitation nursing.  The patient is currently mod assist overall with mobility and basic ADLs.  Discharge setting and therapy post discharge at home with home health is anticipated.  Patient has agreed to participate in the Acute Inpatient Rehabilitation Program and will admit today.  Preadmission Screen Completed By: Danne Baxter RN MSN with updates by Genella Mech, 02/26/2021 10:19 AM ______________________________________________________________________   Discussed status with Dr. Hewitt Blade  on 02/26/21 at 56 and received approval for admission today.  Admission Coordinator: Danne Baxter RN  MSN with updates by  Clemens Catholic, MS, CCC-SLP  time 1018/Date 02/26/21   Assessment/Plan: Diagnosis: Does the need for close, 24 hr/day Medical supervision in concert with the patient's rehab needs make it unreasonable for this patient to be served in a less intensive setting? Yes Co-Morbidities requiring supervision/potential complications: CHF 94-37%, HTN, GERD, prostate CA s/p XRT seeding; CAD; AAA; tobacco abuse; CAD- SDH/IPH- UTI Due to bladder management, bowel management, safety, skin/wound care, disease management, medication administration, pain management, and patient education, does the patient require 24 hr/day rehab nursing? Yes Does the patient require coordinated care of a physician, rehab nurse, PT, OT, and SLP to address physical and functional deficits in the context of the above medical diagnosis(es)? Yes Addressing deficits in the following areas: balance, endurance, locomotion, strength, transferring, bowel/bladder control, bathing, dressing, feeding, grooming, toileting, cognition, speech, and language Can the patient actively participate in an intensive therapy program of at least 3 hrs of therapy 5 days a week? Yes The potential for patient to make measurable gains while on inpatient rehab is good Anticipated functional outcomes upon discharge from inpatient rehab: modified independent and supervision PT, modified independent and supervision OT, modified independent SLP Estimated rehab length of stay to reach the above functional goals is: 10-12 days Anticipated discharge destination: Home 10. Overall Rehab/Functional Prognosis: good   MD Signature:

## 2021-02-25 NOTE — Progress Notes (Signed)
Patient ID: [name], male   DOB: 1945/04/29, 75 y.o.   MRN: [id number] BP 140/69 (BP Location: Left Arm)   Pulse 61   Temp 98.1 F (36.7 C) (Oral)   Resp 14   SpO2 97%  Alert and oriented to person. Confused, will follow commands. Moving extremities Awaiting placement for him

## 2021-02-25 NOTE — Progress Notes (Signed)
Patient called out and said his stomach hurt and he felt nauseas.  RN entered the room and patient was eating dinner now.  RN reinforced that he has to eat when taking pain medication on a regular basis.  IV Zofran was administered at this time.

## 2021-02-26 ENCOUNTER — Inpatient Hospital Stay (HOSPITAL_COMMUNITY)
Admission: RE | Admit: 2021-02-26 | Discharge: 2021-03-16 | DRG: 057 | Disposition: A | Discharge status: Home Health | Payer: PPO | Source: Intra-hospital | Attending: Physical Medicine & Rehabilitation | Admitting: Physical Medicine & Rehabilitation

## 2021-02-26 ENCOUNTER — Inpatient Hospital Stay (HOSPITAL_COMMUNITY): Admission: RE | Admit: 2021-02-26 | Payer: PPO | Source: Intra-hospital | Admitting: Physical Medicine & Rehabilitation

## 2021-02-26 DIAGNOSIS — Z8249 Family history of ischemic heart disease and other diseases of the circulatory system: Secondary | ICD-10-CM

## 2021-02-26 DIAGNOSIS — Z87442 Personal history of urinary calculi: Secondary | ICD-10-CM | POA: Diagnosis not present

## 2021-02-26 DIAGNOSIS — I110 Hypertensive heart disease with heart failure: Secondary | ICD-10-CM | POA: Diagnosis not present

## 2021-02-26 DIAGNOSIS — Z923 Personal history of irradiation: Secondary | ICD-10-CM | POA: Diagnosis not present

## 2021-02-26 DIAGNOSIS — M25561 Pain in right knee: Secondary | ICD-10-CM | POA: Diagnosis present

## 2021-02-26 DIAGNOSIS — I5022 Chronic systolic (congestive) heart failure: Secondary | ICD-10-CM

## 2021-02-26 DIAGNOSIS — I252 Old myocardial infarction: Secondary | ICD-10-CM

## 2021-02-26 DIAGNOSIS — K5901 Slow transit constipation: Secondary | ICD-10-CM | POA: Diagnosis not present

## 2021-02-26 DIAGNOSIS — J432 Centrilobular emphysema: Secondary | ICD-10-CM | POA: Diagnosis present

## 2021-02-26 DIAGNOSIS — L89152 Pressure ulcer of sacral region, stage 2: Secondary | ICD-10-CM | POA: Diagnosis present

## 2021-02-26 DIAGNOSIS — I714 Abdominal aortic aneurysm, without rupture, unspecified: Secondary | ICD-10-CM | POA: Diagnosis present

## 2021-02-26 DIAGNOSIS — G441 Vascular headache, not elsewhere classified: Secondary | ICD-10-CM

## 2021-02-26 DIAGNOSIS — F17210 Nicotine dependence, cigarettes, uncomplicated: Secondary | ICD-10-CM | POA: Diagnosis not present

## 2021-02-26 DIAGNOSIS — K5900 Constipation, unspecified: Secondary | ICD-10-CM | POA: Diagnosis present

## 2021-02-26 DIAGNOSIS — E440 Moderate protein-calorie malnutrition: Secondary | ICD-10-CM | POA: Diagnosis not present

## 2021-02-26 DIAGNOSIS — R252 Cramp and spasm: Secondary | ICD-10-CM | POA: Diagnosis not present

## 2021-02-26 DIAGNOSIS — Z833 Family history of diabetes mellitus: Secondary | ICD-10-CM

## 2021-02-26 DIAGNOSIS — Z955 Presence of coronary angioplasty implant and graft: Secondary | ICD-10-CM

## 2021-02-26 DIAGNOSIS — R2689 Other abnormalities of gait and mobility: Secondary | ICD-10-CM | POA: Diagnosis not present

## 2021-02-26 DIAGNOSIS — L8990 Pressure ulcer of unspecified site, unspecified stage: Secondary | ICD-10-CM | POA: Insufficient documentation

## 2021-02-26 DIAGNOSIS — Z8546 Personal history of malignant neoplasm of prostate: Secondary | ICD-10-CM | POA: Diagnosis not present

## 2021-02-26 DIAGNOSIS — I951 Orthostatic hypotension: Secondary | ICD-10-CM | POA: Diagnosis not present

## 2021-02-26 DIAGNOSIS — I69118 Other symptoms and signs involving cognitive functions following nontraumatic intracerebral hemorrhage: Secondary | ICD-10-CM

## 2021-02-26 DIAGNOSIS — I739 Peripheral vascular disease, unspecified: Secondary | ICD-10-CM | POA: Diagnosis present

## 2021-02-26 DIAGNOSIS — K219 Gastro-esophageal reflux disease without esophagitis: Secondary | ICD-10-CM | POA: Diagnosis present

## 2021-02-26 DIAGNOSIS — E785 Hyperlipidemia, unspecified: Secondary | ICD-10-CM | POA: Diagnosis not present

## 2021-02-26 DIAGNOSIS — I2510 Atherosclerotic heart disease of native coronary artery without angina pectoris: Secondary | ICD-10-CM | POA: Diagnosis present

## 2021-02-26 DIAGNOSIS — I619 Nontraumatic intracerebral hemorrhage, unspecified: Secondary | ICD-10-CM | POA: Diagnosis present

## 2021-02-26 DIAGNOSIS — I10 Essential (primary) hypertension: Secondary | ICD-10-CM | POA: Diagnosis not present

## 2021-02-26 DIAGNOSIS — I69198 Other sequelae of nontraumatic intracerebral hemorrhage: Secondary | ICD-10-CM | POA: Diagnosis not present

## 2021-02-26 DIAGNOSIS — M25562 Pain in left knee: Secondary | ICD-10-CM | POA: Diagnosis present

## 2021-02-26 DIAGNOSIS — I609 Nontraumatic subarachnoid hemorrhage, unspecified: Secondary | ICD-10-CM | POA: Diagnosis present

## 2021-02-26 DIAGNOSIS — I5042 Chronic combined systolic (congestive) and diastolic (congestive) heart failure: Secondary | ICD-10-CM | POA: Diagnosis not present

## 2021-02-26 DIAGNOSIS — Z801 Family history of malignant neoplasm of trachea, bronchus and lung: Secondary | ICD-10-CM

## 2021-02-26 DIAGNOSIS — I5023 Acute on chronic systolic (congestive) heart failure: Secondary | ICD-10-CM

## 2021-02-26 LAB — CULTURE, BLOOD (ROUTINE X 2)
Culture: NO GROWTH
Culture: NO GROWTH
Special Requests: ADEQUATE
Special Requests: ADEQUATE

## 2021-02-26 LAB — GLUCOSE, CAPILLARY
Glucose-Capillary: 153 mg/dL — ABNORMAL HIGH (ref 70–99)
Glucose-Capillary: 166 mg/dL — ABNORMAL HIGH (ref 70–99)
Glucose-Capillary: 114 mg/dL — ABNORMAL HIGH (ref 70–99)
Glucose-Capillary: 105 mg/dL — ABNORMAL HIGH (ref 70–99)
Glucose-Capillary: 109 mg/dL — ABNORMAL HIGH (ref 70–99)

## 2021-02-26 MED ORDER — NICOTINE 21 MG/24HR TD PT24
21.0000 mg | MEDICATED_PATCH | Freq: Every day | TRANSDERMAL | Status: DC
  Administered 2021-02-27 – 2021-03-15 (×17): 21 mg via TRANSDERMAL
  Filled 2021-02-26 (×18): qty 1

## 2021-02-26 MED ORDER — DOCUSATE SODIUM 100 MG PO CAPS
100.0000 mg | ORAL_CAPSULE | Freq: Two times a day (BID) | ORAL | Status: DC
  Administered 2021-02-26 – 2021-03-08 (×19): 100 mg via ORAL
  Filled 2021-02-26 (×20): qty 1

## 2021-02-26 MED ORDER — BUDESONIDE 0.5 MG/2ML IN SUSP
0.5000 mg | Freq: Two times a day (BID) | RESPIRATORY_TRACT | Status: DC
  Administered 2021-02-26 – 2021-03-16 (×31): 0.5 mg via RESPIRATORY_TRACT
  Filled 2021-02-26 (×39): qty 2

## 2021-02-26 MED ORDER — SACUBITRIL-VALSARTAN 97-103 MG PO TABS
1.0000 | ORAL_TABLET | Freq: Two times a day (BID) | ORAL | Status: DC
  Administered 2021-02-26 – 2021-03-16 (×36): 1 via ORAL
  Filled 2021-02-26 (×38): qty 1

## 2021-02-26 MED ORDER — BLOOD PRESSURE CONTROL BOOK
Freq: Once | Status: AC
  Filled 2021-02-26: qty 1

## 2021-02-26 MED ORDER — PROCHLORPERAZINE MALEATE 5 MG PO TABS: 5.0000 mg | ORAL_TABLET | Freq: Four times a day (QID) | ORAL | Status: DC | PRN

## 2021-02-26 MED ORDER — PROCHLORPERAZINE 25 MG RE SUPP: 12.5000 mg | Freq: Four times a day (QID) | RECTAL | Status: DC | PRN

## 2021-02-26 MED ORDER — SORBITOL 70 % SOLN
60.0000 mL | Freq: Once | Status: AC
  Administered 2021-02-26: 30 mL via ORAL
  Filled 2021-02-26: qty 60

## 2021-02-26 MED ORDER — PROCHLORPERAZINE EDISYLATE 10 MG/2ML IJ SOLN: 5.0000 mg | Freq: Four times a day (QID) | INTRAMUSCULAR | Status: DC | PRN

## 2021-02-26 MED ORDER — HYDRALAZINE HCL 25 MG PO TABS
25.0000 mg | ORAL_TABLET | Freq: Three times a day (TID) | ORAL | Status: DC | PRN
  Administered 2021-02-26: 25 mg via ORAL
  Filled 2021-02-26: qty 1

## 2021-02-26 MED ORDER — BISACODYL 10 MG RE SUPP
10.0000 mg | Freq: Every day | RECTAL | Status: DC
  Administered 2021-02-26 – 2021-03-12 (×11): 10 mg via RECTAL
  Filled 2021-02-26 (×15): qty 1

## 2021-02-26 MED ORDER — ACETAMINOPHEN 325 MG PO TABS
325.0000 mg | ORAL_TABLET | ORAL | Status: DC | PRN
  Administered 2021-02-27 – 2021-03-01 (×3): 650 mg via ORAL
  Administered 2021-03-01: 325 mg via ORAL
  Administered 2021-03-04 – 2021-03-14 (×13): 650 mg via ORAL
  Filled 2021-02-26 (×15): qty 2

## 2021-02-26 MED ORDER — HYDROCODONE-ACETAMINOPHEN 5-325 MG PO TABS
1.0000 | ORAL_TABLET | ORAL | Status: DC | PRN
  Administered 2021-02-26 – 2021-03-02 (×4): 1 via ORAL
  Administered 2021-03-02 – 2021-03-05 (×6): 2 via ORAL
  Administered 2021-03-06: 1 via ORAL
  Administered 2021-03-08 – 2021-03-09 (×2): 2 via ORAL
  Filled 2021-02-26: qty 1
  Filled 2021-02-26 (×3): qty 2
  Filled 2021-02-26 (×2): qty 1
  Filled 2021-02-26 (×2): qty 2
  Filled 2021-02-26: qty 1
  Filled 2021-02-26 (×2): qty 2
  Filled 2021-02-26 (×4): qty 1

## 2021-02-26 MED ORDER — ALBUTEROL SULFATE (2.5 MG/3ML) 0.083% IN NEBU
2.5000 mg | INHALATION_SOLUTION | Freq: Four times a day (QID) | RESPIRATORY_TRACT | Status: DC | PRN
  Administered 2021-02-26 – 2021-02-28 (×2): 2.5 mg via RESPIRATORY_TRACT
  Filled 2021-02-26 (×2): qty 3

## 2021-02-26 MED ORDER — POLYETHYLENE GLYCOL 3350 17 G PO PACK
17.0000 g | PACK | ORAL | Status: DC | PRN
  Administered 2021-02-27: 17 g via ORAL
  Filled 2021-02-26: qty 1

## 2021-02-26 MED ORDER — CARVEDILOL 3.125 MG PO TABS
3.1250 mg | ORAL_TABLET | Freq: Two times a day (BID) | ORAL | Status: DC
  Administered 2021-02-26 – 2021-03-16 (×30): 3.125 mg via ORAL
  Filled 2021-02-26 (×35): qty 1

## 2021-02-26 MED ORDER — ARFORMOTEROL TARTRATE 15 MCG/2ML IN NEBU
15.0000 ug | INHALATION_SOLUTION | Freq: Two times a day (BID) | RESPIRATORY_TRACT | Status: DC
  Administered 2021-02-26 – 2021-03-16 (×30): 15 ug via RESPIRATORY_TRACT
  Filled 2021-02-26 (×38): qty 2

## 2021-02-26 MED ORDER — CLONIDINE HCL 0.1 MG PO TABS
0.1000 mg | ORAL_TABLET | Freq: Four times a day (QID) | ORAL | Status: DC | PRN
  Administered 2021-02-26: 0.1 mg via ORAL
  Filled 2021-02-26: qty 1

## 2021-02-26 MED ORDER — PANTOPRAZOLE SODIUM 40 MG PO TBEC
40.0000 mg | DELAYED_RELEASE_TABLET | Freq: Every day | ORAL | Status: DC
  Administered 2021-02-26 – 2021-03-15 (×18): 40 mg via ORAL
  Filled 2021-02-26 (×18): qty 1

## 2021-02-26 NOTE — Progress Notes (Signed)
PMR Admission Coordinator Pre-Admission Assessment   Patient: [name] is an 75 y.o., male MRN: [id number] DOB: 1945-07-12 Height:   Weight:   Insurance Information HMO:     PPO:      PCP:      IPA:      80/20:      OTHER:  PRIMARY: [employer]      Policy#: [id number]      Subscriber: pt CM Name: Marlowe Kays      Phone#: [phone]     Fax#: [phone] Pre-Cert#: [id number] approved for 7 days      Employer:  Benefits:  Phone #: [phone]     Name: 11/10 Eff. Date: 04/17/2018     Deduct: none      Out of Pocket Max: $3450      Life Max: none CIR: $325 co pay per day days 1 until 6      SNF: no copay days 1 until 20; $184 co pay per day days 21 until 100 Outpatient: $15 per visit     Co-Pay: visits per medical neccesity Home Health: 80%      Co-Pay: visits per medical neccesity DME: 80%     Co-Pay: 20% Providers: in network  SECONDARY: none   Financial Counselor:       Phone#:    The Engineer, petroleum" for patients in Inpatient Rehabilitation Facilities with attached "Privacy Act [place] Records" was provided and verbally reviewed with: Patient and Family   Emergency Contact Information Contact Information       Name Relation Home Work Mobile    Kuzniar,Vickie Spouse [phone]   [phone]    Chriss Driver     [phone]    Dawn, Kiper     [phone]         Current Medical History  Patient Admitting Diagnosis: SDH   History of Present Illness: 75 year old male with medical history of CHF with EF 30 to 35%, HTN, HLD, CVA, GERD, COPD, PVD, prostate cancer s/p XRT seeding, CAD, AAA, tobacco abuse, Barrett's esophagus who presented on 02/21/21 with syncope and a fall. Patient was in the ED at [facility] where his wife was a patient. Staff suddenly heard wife screaming and found patient unconscious on the floor with epistaxis and urinary incontinence. No seizure activity. Confused when regained consciousness and mildly  lethargic. BP 233/141 and nicardipine drip started .    CT scan performed showing small SDH.  Neurosurgery consulted. [facility] neurosurgeon recommended transfer to [facility] for higher level of care. Patient arrived to [facility] intubated for airway protection from [facility]. Dr Christella Noa consulted at [facility] and felt no operative indication  needed. Neurology consulted with EEG suggestive of cortical dysfunction in right hemisphere likely due to underlying SDH. Also moderate to severe diffuse encephalopathy. Nonspecific etiology but could be due to sedation. No seizures or epileptiform discharges seen. Extubated on 11/8. Acute UTI due to E. Coli  treated with IV Ceftriaxone for 5 days.  Repeat CT showed stability of SDH and Intraparenchymal hemorrhage. Cerebral edema resolved. Holding antiplatelet and anticoagulation.   Patient's medical record from [facility] and [facility] has been reviewed by the rehabilitation admission coordinator and physician.   Past Medical History      Past Medical History:  Diagnosis Date   AAA (abdominal aortic aneurysm)      a.  CTA 7/19: measured 4.5 x 5.3 cm and greatest transverse dimensions   Abnormal nuclear stress test  a.  Myoview 2012: anterior wall ischemia with an estimated EF of 26%.  This was a new wall motion abnormality as well as a newly reduced EF   COPD (chronic obstructive pulmonary disease) (HCC)     Coronary artery disease      a.  Posterior MI in 2002 status post BMS to the LCx; b. [place] 2012: 95% stenosis of the proximal LAD, 95% stenosis of the mid LAD, 30% in-stent restenosis of the mid left circumflex with a second lesion of diffuse 50% stenosis.  The patient underwent successful PCI/BMS to the mid LAD with 0% residual stenosis, LV gram not performed   GERD (gastroesophageal reflux disease)     History of kidney stones     History of prostate cancer     Hyperlipidemia     Hypertension     Myocardial infarction [facility])     Neuromuscular disorder ([place])      Prostate cancer ([place])      a.  Status post seeding   PVD (peripheral vascular disease) ([place])     Stroke [facility])      Has the patient had major surgery during 100 days prior to admission? No   Family History   family history includes Diabetes in his mother; Heart attack in his mother; Heart disease in his father; Lung cancer in his daughter.   Current Medications   Current Facility-Administered Medications:    0.9 %  sodium chloride infusion, , Intravenous, Continuous, Cabbell, Marylyn Ishihara, MD, Last Rate: 50 mL/hr at 02/24/21 1328, New Bag at 02/24/21 1328   albuterol (PROVENTIL) (2.5 MG/3ML) 0.083% nebulizer solution 2.5 mg, 2.5 mg, Nebulization, Q6H PRN, Ashok Pall, MD, 2.5 mg at 02/24/21 2004   arformoterol (BROVANA) nebulizer solution 15 mcg, 15 mcg, Nebulization, BID, Erick Colace, NP, 15 mcg at 02/26/21 0901   budesonide (PULMICORT) nebulizer solution 0.5 mg, 0.5 mg, Nebulization, BID, Salvadore Dom E, NP, 0.5 mg at 02/26/21 0902   carvedilol (COREG) tablet 3.125 mg, 3.125 mg, Oral, BID WC, Ashok Pall, MD, 3.125 mg at 02/26/21 0900   docusate sodium (COLACE) capsule 100 mg, 100 mg, Oral, BID, Ashok Pall, MD, 100 mg at 02/26/21 0859   hydrALAZINE (APRESOLINE) injection 10 mg, 10 mg, Intravenous, Q4H PRN, Bowser, Grace E, NP, 10 mg at 02/26/21 0415   HYDROcodone-acetaminophen (NORCO/VICODIN) 5-325 MG per tablet 1-2 tablet, 1-2 tablet, Oral, Q4H PRN, Ashok Pall, MD, 2 tablet at 02/26/21 0900   nicotine (NICODERM CQ - dosed in mg/24 hours) patch 21 mg, 21 mg, Transdermal, Daily, Chand, Sudham, MD, 21 mg at 02/26/21 0902   ondansetron (ZOFRAN) injection 4 mg, 4 mg, Intravenous, Q6H PRN, Desai, Rahul P, PA-C, 4 mg at 02/25/21 0126   pantoprazole (PROTONIX) EC tablet 40 mg, 40 mg, Oral, QHS, Cabbell, Kyle, MD, 40 mg at 02/25/21 2152   polyethylene glycol (MIRALAX / GLYCOLAX) packet 17 g, 17 g, Oral, PRN, Ashok Pall, MD   sacubitril-valsartan (ENTRESTO) 97-103 mg per tablet, 1  tablet, Oral, BID, Jacky Kindle, MD, 1 tablet at 02/26/21 0900   Patients Current Diet:  Diet Order                  Diet Heart Room service appropriate? Yes; Fluid consistency: Thin  Diet effective now                       Precautions / Restrictions Precautions Precautions: Fall Restrictions Weight Bearing Restrictions: No    Has the patient had  2 or more falls or a fall with injury in the past year? Yes   Prior Activity Level Community (5-7x/wk): Independent; driving, caregiver for his wife. Patient cares for his wife who is on peritoneal dialysis. 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    Prior Functional Level Self Care: Did the patient need help bathing, dressing, using the toilet or eating? Independent   Indoor Mobility: Did the patient need assistance with walking from room to room (with or without device)? Independent   Stairs: Did the patient need assistance with internal or external stairs (with or without device)? Independent   Functional Cognition: Did the patient need help planning regular tasks such as shopping or remembering to take medications? Independent   Patient Information Are you of Hispanic, Latino/a,or Spanish origin?: A. No, not of Hispanic, Latino/a, or Spanish origin What is your race?: A. White Do you need or want an interpreter to communicate with a doctor or health care staff?: 0. No   Patient's Response To:  Health Literacy and Transportation Is the patient able to respond to health literacy and transportation needs?: Yes Health Literacy - How often do you need to have someone help you when you read instructions, pamphlets, or other written material from your doctor or pharmacy?: Never In the past 12 months, has lack of transportation kept you from medical appointments or from getting medications?: No In the past 12 months, has lack of transportation kept you from meetings, work, or from  getting things needed for daily living?: No   Home Assistive Devices / Equipment Home Equipment: Conservation officer, nature (2 wheels), [employer] - single point, Contractor Use: Indicate devices/aids used by the patient prior to current illness, exacerbation or injury?  Cane at times   Current Functional Level Cognition   Arousal/Alertness: Awake/alert Overall Cognitive Status: Impaired/Different from baseline Current Attention Level: Sustained Orientation Level: Oriented to person, Oriented to place, Oriented to time, Disoriented to situation Following Commands: Follows multi-step commands inconsistently Safety/Judgement: Decreased awareness of safety, Decreased awareness of deficits General Comments: continues to require cues for attention and memory Attention: Sustained Sustained Attention: Impaired Sustained Attention Impairment: Verbal basic, Functional basic Memory: Impaired Memory Impairment: Decreased short term memory, Decreased recall of new information, Retrieval deficit Decreased Short Term Memory: Verbal basic, Functional basic Awareness: Impaired Awareness Impairment: Other (comment), Intellectual impairment (reported confusion yesterday, but improving) Behaviors: Perseveration Safety/Judgment: Impaired    Extremity Assessment (includes Sensation/Coordination)   Upper Extremity Assessment: LUE deficits/detail LUE Deficits / Details: LUE improving, better proprioception (able to complete finger to nose and put on toothpaste) deficits still remain LUE Sensation: decreased light touch, decreased proprioception LUE Coordination: decreased fine motor  Lower Extremity Assessment: Defer to PT evaluation     ADLs   Overall ADL's : Needs assistance/impaired Grooming: Wash/dry face, Oral care, Minimal assistance, Standing, Cueing for sequencing, Cueing for safety Grooming Details (indicate cue type and reason): sink level, dependent on SUE for balance able to find objects on  the left side today Upper Body Bathing: Moderate assistance Lower Body Bathing: Moderate assistance Upper Body Dressing : Moderate assistance Lower Body Dressing: Moderate assistance Toilet Transfer: Minimal assistance, Ambulation, BSC/3in1, Rolling walker (2 wheels), Grab bars, Cueing for safety, Cueing for sequencing Toilet Transfer Details (indicate cue type and reason): vc for safe hand placement Toileting- Clothing Manipulation and Hygiene: Moderate assistance, Sit to/from stand Functional mobility during ADLs: Minimal assistance, Rolling walker (2  wheels) General ADL Comments: increased time for all aspects of ADL/transfers     Mobility   Overal bed mobility: Needs Assistance Bed Mobility: Supine to Sit, Sit to Supine Supine to sit: Min assist Sit to supine: Supervision General bed mobility comments: minA for trunk elevation     Transfers   Overall transfer level: Needs assistance Equipment used: Rolling walker (2 wheels) Transfers: Sit to/from Stand Sit to Stand: Min assist General transfer comment: minA for boost up into standing from low bed surface     Ambulation / Gait / Stairs / Wheelchair Mobility   Ambulation/Gait Ambulation/Gait assistance: Herbalist (Feet): 150 Feet Assistive device: Rolling walker (2 wheels) Gait Pattern/deviations: Step-to pattern, Decreased stride length, Shuffle, Trunk flexed, Wide base of support General Gait Details: very slow gait speed. Cues to increase speed with intermittent follow through. Cues required for close proximity throughout. Difficulty wayfinding back to room. Assist for balance, wayfinding, and RW management Gait velocity: decreased Gait velocity interpretation: <1.31 ft/sec, indicative of household ambulator     Posture / Balance Balance Overall balance assessment: Needs assistance Sitting-balance support: Bilateral upper extremity supported, Feet supported Sitting balance-Leahy Scale: Fair Standing balance  support: Bilateral upper extremity supported, During functional activity, Reliant on assistive device for balance Standing balance-Leahy Scale: Poor Standing balance comment: benefits from BUE support, or leans against sink in static standing     Special needs/care consideration Fall precautions    Previous Home Environment  Living Arrangements: Spouse/significant other  Lives With: Spouse Available Help at Discharge: Family, Available 24 hours/day (children, spouses and grandchildren to provide 24/7 care of couple at discharge) Type of Home: House Home Layout: One level Home Access: Ramped entrance Bathroom Shower/Tub: Multimedia programmer: Standard Bathroom Accessibility: Yes How Accessible: Accessible via walker [place]: No Additional Comments: pt lives with his wife but she just broke her hip for the second time (first time was 2/22). Family lives nearby but they work in the day.   Discharge Living Setting Plans for Discharge Living Setting: Patient's home, Lives with (comment) (spouse) Type of Home at Discharge: House Discharge Home Layout: One level Discharge Home Access: [place] entrance Discharge Bathroom Shower/Tub: Walk-in shower Discharge Bathroom Toilet: Standard Discharge Bathroom Accessibility: Yes How Accessible: Accessible via walker Does the patient have any problems obtaining your medications?: No   Social/Family/Support Systems Patient Roles: Caregiver, Parent, Spouse Contact Information: spouse, Vickie and son, Hilliard Clark are main 2 contacts Anticipated Caregiver: sons and their families are rotating to proivide 24/7 min asisst of the couple after CIR admit Anticipated Caregiver's Contact Information: see contacts Ability/Limitations of Caregiver: children work, but will arrange rotating schedules Caregiver Availability: 24/7 Discharge Plan Discussed with Primary Caregiver: Yes Is Caregiver In Agreement with Plan?: Yes Does Caregiver/Family  have Issues with Lodging/Transportation while Pt is in Rehab?: No   Goals Patient/Family Goal for Rehab: Mod I to supervision with PT, OT and SLP Expected length of stay: ELOS 10 to 12 days Pt/Family Agrees to Admission and willing to participate: Yes Program Orientation Provided & Reviewed with Pt/Caregiver Including Roles  & Responsibilities: Yes   His spouse, Vickie, at [facility] to also be admitted to [facility] CIR when bed is available.    Decrease burden of Care through IP rehab admission: n/a   Possible need for SNF placement upon discharge: not anticipated   Patient Condition: I have reviewed medical records from [facility], spoken with CM, and patient, spouse, and son. I met with patient  at the bedside for inpatient rehabilitation assessment.  Patient will benefit from ongoing PT, OT, and SLP, can actively participate in 3 hours of therapy a day 5 days of the week, and can make measurable gains during the admission.  Patient will also benefit from the coordinated team approach during an Inpatient Acute Rehabilitation admission.  The patient will receive intensive therapy as well as Rehabilitation physician, nursing, social worker, and care management interventions.  Due to bladder management, bowel management, safety, skin/wound care, disease management, medication administration, pain management, and patient education the patient requires 24 hour a day rehabilitation nursing.  The patient is currently mod assist overall with mobility and basic ADLs.  Discharge setting and therapy post discharge at home with home health is anticipated.  Patient has agreed to participate in the Acute Inpatient Rehabilitation Program and will admit today.   Preadmission Screen Completed By: Danne Baxter RN MSN with updates by Genella Mech, 02/26/2021 10:19 AM ______________________________________________________________________   Discussed status with Dr. Hewitt Blade  on 02/26/21 at 70 and received  approval for admission today.   Admission Coordinator: Danne Baxter RN MSN with updates by  Clemens Catholic, MS, CCC-SLP  time 1018/Date 02/26/21    Assessment/Plan: Diagnosis: Does the need for close, 24 hr/day Medical supervision in concert with the patient's rehab needs make it unreasonable for this patient to be served in a less intensive setting? Yes Co-Morbidities requiring supervision/potential complications: CHF 90-93%, HTN, GERD, prostate CA s/p XRT seeding; CAD; AAA; tobacco abuse; CAD- SDH/IPH- UTI Due to bladder management, bowel management, safety, skin/wound care, disease management, medication administration, pain management, and patient education, does the patient require 24 hr/day rehab nursing? Yes Does the patient require coordinated care of a physician, rehab nurse, PT, OT, and SLP to address physical and functional deficits in the context of the above medical diagnosis(es)? Yes Addressing deficits in the following areas: balance, endurance, locomotion, strength, transferring, bowel/bladder control, bathing, dressing, feeding, grooming, toileting, cognition, speech, and language Can the patient actively participate in an intensive therapy program of at least 3 hrs of therapy 5 days a week? Yes The potential for patient to make measurable gains while on inpatient rehab is good Anticipated functional outcomes upon discharge from inpatient rehab: modified independent and supervision PT, modified independent and supervision OT, modified independent SLP Estimated rehab length of stay to reach the above functional goals is: 10-12 days Anticipated discharge destination: Home 10. Overall Rehab/Functional Prognosis: good     MD Signature:

## 2021-02-26 NOTE — Progress Notes (Signed)
Nurse took vital signs and BP was 192/87. Nurse writing took BP manual and read BP 186/86. Nurse writing administered meds (see MAR) and elevated foot of bed, encouraged fluids. Rechecked BP and read 178/71. Dr. Dagoberto Ligas notified. Patient denies pain, SOB, call light is within reach, bed alarm on, bed at lowest setting.

## 2021-02-26 NOTE — Discharge Summary (Signed)
BP (!) 150/79 (BP Location: Left Arm)   Pulse 74   Temp 98.1 F (36.7 C)   Resp 18   SpO2 94%  Physician Discharge Summary  Patient ID: [name] MRN: [id number] DOB/AGE: 1945/06/30 75 y.o.  Admit date: 02/21/2021 Discharge date: 02/26/2021  Admission Diagnoses:acute subdural hematoma  Discharge Diagnoses: same Active Problems:   SDH (subdural hematoma)   Discharged Condition: fair  Hospital Course: Mr. Darling was first admitted to [facility] and found to have an acute subdural hematoma. The neurosurgeon at [facility], Dr. Deetta Perla felt that Mr. Patricelli needed a higher level of care than what he could provide at [facility]. Mr. Korber was then transferred to [facility]. He was observed and intubated upon arrival. Repeat CT head scans showed little worsening. He was extubated and following commands. He remains confused, but moves all extremities. He is discharged to the rehab service.   Treatments: observation.   Discharge Exam: Blood pressure (!) 150/79, pulse 74, temperature 98.1 F (36.7 C), resp. rate 18, SpO2 94 %. As above  Disposition: Discharge disposition: [place] Not Defined      * No surgery found *  Allergies as of 02/26/2021       Reactions   Lyrica [pregabalin]    Swelling   Prednisone    Feels like having a heart attack. Has had the injection form before and does well. Oral Steroids        Medication List     STOP taking these medications    traMADol 50 MG tablet Commonly known as: ULTRAM       TAKE these medications    albuterol 108 (90 Base) MCG/ACT inhaler Commonly known as: VENTOLIN HFA Inhale 2 puffs into the lungs every 6 (six) hours as needed for wheezing.   aspirin 81 MG tablet Take 81 mg by mouth daily.   atorvastatin 80 MG tablet Commonly known as: LIPITOR Take 1 tablet (80 mg total) by mouth daily.   budesonide-formoterol 160-4.5 MCG/ACT inhaler Commonly known as: Symbicort Inhale 2 puffs into  the lungs 2 (two) times daily.   carvedilol 3.125 MG tablet Commonly known as: COREG Take 1 tablet (3.125 mg total) by mouth 2 (two) times daily.   Entresto 97-103 MG Generic drug: sacubitril-valsartan Take 1 tablet by mouth 2 (two) times daily.   montelukast 10 MG tablet Commonly known as: SINGULAIR Take 10 mg by mouth daily.   multivitamin with minerals tablet Take 1 tablet by mouth daily.   omeprazole 40 MG capsule Commonly known as: PRILOSEC TAKE 1 CAPSULE BY MOUTH DAILY USUALLY 30MINUTES BEFORE BREAKFAST What changed: See the new instructions.   polyethylene glycol powder 17 GM/SCOOP powder Commonly known as: GLYCOLAX/MIRALAX Take 17 g by mouth daily as needed for mild constipation.   spironolactone 25 MG tablet Commonly known as: ALDACTONE Take 0.5 tablets (12.5 mg total) by mouth daily.        Follow-up Information     Ashok Pall, MD Follow up in 3 week(s).   Specialty: Neurosurgery Why: please call to make an appointment after discharge from the rehabilitation facility, please also state you will need a head CT for your return visit Contact information: [address] [place] [address] [phone]                 Signed: Ashok Pall 02/26/2021, 12:27 PM

## 2021-02-26 NOTE — H&P (Signed)
Physical Medicine and Rehabilitation Admission H&P  [name] is an 75 y.o. male with hx of CHF EF 30-35%, HTN, HLD, prvious CVA, GERD, COPD, prostate CA s/p XRT seeding; AAA; tobacco abuse and CAD Admitted with syncope and fall in ER- found to have BP of 233/141 in ED and found to be unconscious. Was in ED to accompany his wife when passed out.  Also has epistaxis and incontinence.  Found to have SDH and IPH as well as cerebral edema- was intubated-   Extubated 02/22/21 after intubation 11/7. Not needing OR/intervention for SDH per NSU.  Found to have E Coli UTI and s/p IV Ceftriaxone-   Main complaints- has had B/L leg jumping for years- chronic- is tired and has frontal HA- is new.  Back on O2- which doesn't use at home.  LBM last week which is pretty normal for him- feels "1/2 full".  Using condom catheter- is working "fine".   Review of Systems  Constitutional:  Positive for appetite change and fatigue.  HENT:  Positive for nosebleeds. Negative for sinus pain and sneezing.   Eyes: Negative.   Respiratory:  Positive for shortness of breath. Negative for choking, chest tightness and wheezing.   Cardiovascular: Negative.   Gastrointestinal:  Positive for abdominal distention and constipation. Negative for abdominal pain, anal bleeding and nausea.  Endocrine: Negative.   Genitourinary:  Positive for frequency. Negative for dysuria, penile discharge and scrotal swelling.  Musculoskeletal:  Positive for gait problem and neck pain. Negative for back pain and joint swelling.  Skin: Negative.   Allergic/Immunologic: Negative.   Neurological:  Positive for weakness and headaches. Negative for dizziness, seizures and facial asymmetry.  Psychiatric/Behavioral:  Positive for sleep disturbance. Negative for hallucinations. The patient is not hyperactive.   All other systems reviewed and are negative. Past Medical History:  Diagnosis Date   AAA (abdominal aortic aneurysm)    a.  CTA 7/19:  measured 4.5 x 5.3 cm and greatest transverse dimensions   Abnormal nuclear stress test    a.  Myoview 2012: anterior wall ischemia with an estimated EF of 26%.  This was a new wall motion abnormality as well as a newly reduced EF   COPD (chronic obstructive pulmonary disease) (HCC)    Coronary artery disease    a.  Posterior MI in 2002 status post BMS to the LCx; b. [place] 2012: 95% stenosis of the proximal LAD, 95% stenosis of the mid LAD, 30% in-stent restenosis of the mid left circumflex with a second lesion of diffuse 50% stenosis.  The patient underwent successful PCI/BMS to the mid LAD with 0% residual stenosis, LV gram not performed   GERD (gastroesophageal reflux disease)    History of kidney stones    History of prostate cancer    Hyperlipidemia    Hypertension    Myocardial infarction [facility])    Neuromuscular disorder ([place])    Prostate cancer ([place])    a.  Status post seeding   PVD (peripheral vascular disease) ([place])    Stroke [facility])    Past Surgical History:  Procedure Laterality Date   BACK SURGERY  2009/2010   x 2   CARDIAC CATHETERIZATION  2002   stent placement [place]   ENDOVASCULAR REPAIR/STENT GRAFT N/A 01/02/2018   Procedure: ENDOVASCULAR REPAIR/STENT GRAFT;  Surgeon: Algernon Huxley, MD;  Location: [place] [facility];  Service: Cardiovascular;  Laterality: N/A;   FOOT SURGERY     bilateral    HERNIA REPAIR  bilateral    KNEE SURGERY     right knee    PROSTATE SURGERY  09/2009   prostate implant   SPINE SURGERY     Family History  Problem Relation Age of Onset   Heart attack Mother    Diabetes Mother    Heart disease Father    Lung cancer Daughter    Social History:  reports that he has been smoking cigarettes. He has a 14.00 pack-year smoking history. He has never used smokeless tobacco. He reports that he does not drink alcohol and does not use drugs. Allergies:  Allergies  Allergen Reactions   Lyrica [Pregabalin]     Swelling    Prednisone      Feels like having a heart attack. Has had the injection form before and does well. Oral Steroids   Medications Prior to Admission  Medication Sig Dispense Refill   albuterol (PROVENTIL HFA;VENTOLIN HFA) 108 (90 Base) MCG/ACT inhaler Inhale 2 puffs into the lungs every 6 (six) hours as needed for wheezing. 1 Inhaler 5   aspirin 81 MG tablet Take 81 mg by mouth daily.     atorvastatin (LIPITOR) 80 MG tablet Take 1 tablet (80 mg total) by mouth daily. 90 tablet 3   budesonide-formoterol (SYMBICORT) 160-4.5 MCG/ACT inhaler Inhale 2 puffs into the lungs 2 (two) times daily. 1 Inhaler 5   carvedilol (COREG) 3.125 MG tablet Take 1 tablet (3.125 mg total) by mouth 2 (two) times daily. 180 tablet 3   montelukast (SINGULAIR) 10 MG tablet Take 10 mg by mouth daily.     Multiple Vitamins-Minerals (MULTIVITAMIN WITH MINERALS) tablet Take 1 tablet by mouth daily.     omeprazole (PRILOSEC) 40 MG capsule TAKE 1 CAPSULE BY MOUTH DAILY USUALLY 30MINUTES BEFORE BREAKFAST (Patient taking differently: Take 40 mg by mouth daily. 30 minutes before breakfast) 90 capsule 1   polyethylene glycol powder (GLYCOLAX/MIRALAX) powder Take 17 g by mouth daily as needed for mild constipation.     sacubitril-valsartan (ENTRESTO) 97-103 MG Take 1 tablet by mouth 2 (two) times daily. 180 tablet 3   traMADol (ULTRAM) 50 MG tablet Take 50 mg by mouth daily as needed for pain.     spironolactone (ALDACTONE) 25 MG tablet Take 0.5 tablets (12.5 mg total) by mouth daily. 45 tablet 3    Drug Regimen Review  Drug regimen was reviewed and remains appropriate with no significant issues identified   Home: Home Living Family/patient expects to be discharged to:: Private residence Living Arrangements: Spouse/significant other Available Help at Discharge: Family, Available 24 hours/day (children, spouses and grandchildren to provide 24/7 care of couple at discharge) Type of Home: House Home Access: Ramped entrance Home Layout: One  level Bathroom Shower/Tub: Multimedia programmer: Standard Bathroom Accessibility: Yes Home Equipment: Conservation officer, nature (2 wheels), [employer] - single point, Civil engineer, contracting Additional Comments: pt lives with his wife but she just broke her hip for the second time (first time was 2/22). Family lives nearby but they work in the day.  Lives With: Spouse   Functional History: Prior Function Prior Level of Function : Independent/Modified Independent, Driving Mobility Comments: pt uses cane for "safety" per his report  Functional Status:  Mobility: Bed Mobility Overal bed mobility: Needs Assistance Bed Mobility: Supine to Sit, Sit to Supine Supine to sit: Min assist Sit to supine: Supervision General bed mobility comments: minA for trunk elevation Transfers Overall transfer level: Needs assistance Equipment used: Rolling walker (2 wheels) Transfers: Sit to/from Stand Sit to Stand:  Min assist General transfer comment: minA for boost up into standing from low bed surface Ambulation/Gait Ambulation/Gait assistance: Min assist Gait Distance (Feet): 150 Feet Assistive device: Rolling walker (2 wheels) Gait Pattern/deviations: Step-to pattern, Decreased stride length, Shuffle, Trunk flexed, Wide base of support General Gait Details: very slow gait speed. Cues to increase speed with intermittent follow through. Cues required for close proximity throughout. Difficulty wayfinding back to room. Assist for balance, wayfinding, and RW management Gait velocity: decreased Gait velocity interpretation: <1.31 ft/sec, indicative of household ambulator    ADL: ADL Overall ADL's : Needs assistance/impaired Grooming: Wash/dry face, Oral care, Minimal assistance, Standing, Cueing for sequencing, Cueing for safety Grooming Details (indicate cue type and reason): sink level, dependent on SUE for balance able to find objects on the left side today Upper Body Bathing: Moderate assistance Lower Body  Bathing: Moderate assistance Upper Body Dressing : Moderate assistance Lower Body Dressing: Moderate assistance Toilet Transfer: Minimal assistance, Ambulation, BSC/3in1, Rolling walker (2 wheels), Grab bars, Cueing for safety, Cueing for sequencing Toilet Transfer Details (indicate cue type and reason): vc for safe hand placement Toileting- Clothing Manipulation and Hygiene: Moderate assistance, Sit to/from stand Functional mobility during ADLs: Minimal assistance, Rolling walker (2 wheels) General ADL Comments: increased time for all aspects of ADL/transfers  Cognition: Cognition Overall Cognitive Status: Impaired/Different from baseline Arousal/Alertness: Awake/alert Orientation Level: Oriented to person, Oriented to place, Oriented to time, Disoriented to situation Year: 2022 Month: November Day of Week: Incorrect Attention: Sustained Sustained Attention: Impaired Sustained Attention Impairment: Verbal basic, Functional basic Memory: Impaired Memory Impairment: Decreased short term memory, Decreased recall of new information, Retrieval deficit Decreased Short Term Memory: Verbal basic, Functional basic Immediate Memory Recall: Sock, Blue, Bed Memory Recall Sock: Without Cue Memory Recall Blue: Without Cue Memory Recall Bed: With Cue Awareness: Impaired Awareness Impairment: Other (comment), Intellectual impairment (reported confusion yesterday, but improving) Behaviors: Perseveration Safety/Judgment: Impaired Cognition Arousal/Alertness: Awake/alert Behavior During Therapy: WFL for tasks assessed/performed, Flat affect Overall Cognitive Status: Impaired/Different from baseline Area of Impairment: Safety/judgement, Awareness, Problem solving Orientation Level: Disoriented to, Time, Situation Current Attention Level: Sustained Memory: Decreased recall of precautions, Decreased short-term memory Following Commands: Follows multi-step commands inconsistently Safety/Judgement:  Decreased awareness of safety, Decreased awareness of deficits Awareness: Emergent Problem Solving: Slow processing, Requires verbal cues General Comments: continues to require cues for attention and memory   Blood pressure (!) 150/79, pulse 74, temperature 98.1 F (36.7 C), resp. rate 18, SpO2 94 %. Physical Exam Vitals and nursing note reviewed.  Constitutional:      Comments: Elderly male sitting up in bedside chair, overall appropriate; talked to wife on phone as well, NAD  HENT:     Head: Normocephalic.     Comments: Facial smile equal; tongue midline O2 by [place]- on neck, not in nose    Right Ear: External ear normal.     Left Ear: External ear normal.     Nose: Nose normal. No rhinorrhea.     Mouth/Throat:     Mouth: Mucous membranes are dry.     Pharynx: Oropharynx is clear. No oropharyngeal exudate.  Eyes:     General:        Right eye: No discharge.        Left eye: No discharge.     Extraocular Movements: Extraocular movements intact.  Cardiovascular:     Rate and Rhythm: Normal rate and regular rhythm.     Heart sounds: Normal heart sounds. No murmur heard.   No gallop.  Pulmonary:     Comments: CTA b?L, but decreased at bases B/L- coarse cough occ, but no wheezing/rales/rhonchi heard Abdominal:     Comments: Soft, but slightly distended; hypoactive; NT  Genitourinary:    Comments: Condom catheter in place- medium amber urine in bag Musculoskeletal:     Cervical back: Normal range of motion. No rigidity.     Comments: 5-/5 in UE and LE's B/L- throughout  Skin:    Comments: Significant ecchymoses on arms- B/L UE IV's- L hand and R forearm   Neurological:     Mental Status: He is alert.     Comments: Intact to light touch in all 4 extremities Appropriate- Ox2-3- slightly confused on hospital course more than anything  Psychiatric:     Comments: Flat, irritable    Results for orders placed or performed during the hospital encounter of 02/21/21 (from the past  48 hour(s))  Glucose, capillary     Status: Abnormal   Collection Time: 02/24/21  4:01 PM  Result Value Ref Range   Glucose-Capillary 115 (H) 70 - 99 mg/dL    Comment: Glucose reference range applies only to samples taken after fasting for at least 8 hours.  Glucose, capillary     Status: Abnormal   Collection Time: 02/24/21  8:14 PM  Result Value Ref Range   Glucose-Capillary 149 (H) 70 - 99 mg/dL    Comment: Glucose reference range applies only to samples taken after fasting for at least 8 hours.  Glucose, capillary     Status: Abnormal   Collection Time: 02/24/21 11:19 PM  Result Value Ref Range   Glucose-Capillary 136 (H) 70 - 99 mg/dL    Comment: Glucose reference range applies only to samples taken after fasting for at least 8 hours.  Glucose, capillary     Status: Abnormal   Collection Time: 02/25/21  4:32 AM  Result Value Ref Range   Glucose-Capillary 118 (H) 70 - 99 mg/dL    Comment: Glucose reference range applies only to samples taken after fasting for at least 8 hours.  Glucose, capillary     Status: Abnormal   Collection Time: 02/25/21  8:07 AM  Result Value Ref Range   Glucose-Capillary 114 (H) 70 - 99 mg/dL    Comment: Glucose reference range applies only to samples taken after fasting for at least 8 hours.  Glucose, capillary     Status: Abnormal   Collection Time: 02/25/21 12:25 PM  Result Value Ref Range   Glucose-Capillary 115 (H) 70 - 99 mg/dL    Comment: Glucose reference range applies only to samples taken after fasting for at least 8 hours.  Glucose, capillary     Status: Abnormal   Collection Time: 02/25/21  4:29 PM  Result Value Ref Range   Glucose-Capillary 111 (H) 70 - 99 mg/dL    Comment: Glucose reference range applies only to samples taken after fasting for at least 8 hours.  Glucose, capillary     Status: Abnormal   Collection Time: 02/25/21  7:18 PM  Result Value Ref Range   Glucose-Capillary 110 (H) 70 - 99 mg/dL    Comment: Glucose reference  range applies only to samples taken after fasting for at least 8 hours.  Glucose, capillary     Status: Abnormal   Collection Time: 02/25/21 11:31 PM  Result Value Ref Range   Glucose-Capillary 147 (H) 70 - 99 mg/dL    Comment: Glucose reference range applies only to samples taken after fasting for at  least 8 hours.  Glucose, capillary     Status: Abnormal   Collection Time: 02/26/21  4:16 AM  Result Value Ref Range   Glucose-Capillary 153 (H) 70 - 99 mg/dL    Comment: Glucose reference range applies only to samples taken after fasting for at least 8 hours.  Glucose, capillary     Status: Abnormal   Collection Time: 02/26/21  8:05 AM  Result Value Ref Range   Glucose-Capillary 166 (H) 70 - 99 mg/dL    Comment: Glucose reference range applies only to samples taken after fasting for at least 8 hours.  Glucose, capillary     Status: Abnormal   Collection Time: 02/26/21 11:26 AM  Result Value Ref Range   Glucose-Capillary 114 (H) 70 - 99 mg/dL    Comment: Glucose reference range applies only to samples taken after fasting for at least 8 hours.   No results found.  Assessment/Plan:     Medical Problem List and Plan:  SAH/IPH due to hypertensive emergency- with impaired ADLs/mobility Antithrombotics . DVT Prophylaxis/Anticoagulation: Mechanical: Sequential compression devices, below knee Bilateral lower extremities due to brain bleed 2. Pain Management: Norco for HA as needed and tylenol prn- will monitor for needing daily prevention meds for HA 3. Mood: emotional support- and monitor if needs neuropsychology.  4. Neuropsych- the patient is capable of making decisions for himself.  5. Skin/Wounds- routine wound care.  6. Fluids/Electrolytes/Nutrition: Routine in and outs with follow-up chemistries 7. HTN in setting of hypertensive emergency- BP is much better controlled- con't Carvedilol- was getting IV hydralazine prn on Acute- might need PO hydralazine if SBP gets >180 8. CHF with  EF 30-35%- con't Entresto and monitor 9. COPD- con't O2 and wean as tolerated-also continue his regimen of COPD meds- including Pulmicort, Albuterol prn, Brovana. And change as required.  10. Constipation- will order Sorbitol since it's been 1 week since last BM- and con't miralax prn and Colace.  11. UTI- resolved- s/p IV rocephin 12. Smoker- tobacco- abuse- counseling and Nicoderm 21 mg daily 13. Daily headaches- con't Norco for now- but might benefit from Topamax.  14. Leg spasms- is chronic- cannot tell is restless leg syndrome or myoclonus- will monitor- might need to address since was very active.  15. Hx of prostate CA s/p XRT seeding 16. GERD- con't protonix and monitor/change as required 17. AAA/CAD/HLD- con't to monitor     Patient Active Problem List   Diagnosis Date Noted   SDH (subdural hematoma) 02/21/2021   Syncope 02/21/2021   Fall 02/21/2021   Hypertensive urgency 02/21/2021   HTN (hypertension) 02/21/2021   Stroke ([place]) 02/21/2021   Chronic combined systolic and diastolic CHF (congestive heart failure) ([place]) 02/21/2021   Leukocytosis 02/21/2021   Elevated troponin 02/21/2021   Zygomatic arch fracture ([place]) 02/21/2021   UTI (urinary tract infection) 02/21/2021   Sepsis ([place]) 02/21/2021   Other spondylosis with radiculopathy, lumbar region 09/04/2018   Centrilobular emphysema ([place]) 07/19/2018   Dilated cardiomyopathy ([place]) 07/19/2018   Coronary artery disease of native artery of native heart with stable angina pectoris ([place]) 01/20/2018   AAA (abdominal aortic aneurysm) without rupture 10/30/2017   Personal history of tobacco use, presenting hazards to health 08/29/2016   COPD exacerbation ([place]) 04/03/2015   Medicare annual wellness visit, subsequent 05/23/2014   Barrett's esophagus 05/20/2014   Constipation 05/20/2014   Arthritis of hand 05/07/2014   COPD (chronic obstructive pulmonary disease) ([place]) 11/06/2011   Pulmonary nodule 11/06/2011   Tobacco abuse  02/12/2011  Tobacco abuse counseling 02/12/2011   CAD, NATIVE VESSEL 10/18/2008   COLONIC POLYPS 08/05/2008   Hyperlipidemia 08/05/2008   Essential hypertension 08/05/2008   GERD 08/05/2008    Sindhu Nguyen 02/26/2021, 2:17 PM

## 2021-02-26 NOTE — Progress Notes (Signed)
Inpatient Rehab Admissions Coordinator:    I have a bed for this Pt. On CIR today. RN may call [phone] to give report after 12pm.  Clemens Catholic, [place] Admissions Coordinator  [phone] (celll) [phone] (office)

## 2021-02-26 NOTE — H&P (Signed)
Physical Medicine and Rehabilitation  Admission H&P    CC:  hypertensive emergency/SAH   [name] is an 75 y.o. male with hx of CHF EF 30-35%, HTN, HLD, prvious CVA, GERD, COPD, prostate CA s/p XRT seeding; AAA; tobacco abuse and CAD Admitted with syncope and fall in ER- found to have BP of 233/141 in ED and found to be unconscious. Was in ED to accompany his wife when passed out.  Also has epistaxis and incontinence.  Found to have SDH and IPH as well as cerebral edema- was intubated-    Extubated 02/22/21 after intubation 11/7. Not needing OR/intervention for SDH per [facility].  Found to have E Coli UTI and s/p IV Ceftriaxone-    Main complaints- has had B/L leg jumping for years- chronic- is tired and has frontal HA- is new.  Back on O2- which doesn't use at home.  LBM last week which is pretty normal for him- feels "1/2 full".  Using condom catheter- is working "fine".    Review of Systems  Constitutional:  Positive for appetite change and fatigue.  HENT:  Positive for nosebleeds. Negative for sinus pain and sneezing.   Eyes: Negative.   Respiratory:  Positive for shortness of breath. Negative for choking, chest tightness and wheezing.   Cardiovascular: Negative.   Gastrointestinal:  Positive for abdominal distention and constipation. Negative for abdominal pain, anal bleeding and nausea.  Endocrine: Negative.   Genitourinary:  Positive for frequency. Negative for dysuria, penile discharge and scrotal swelling.  Musculoskeletal:  Positive for gait problem and neck pain. Negative for back pain and joint swelling.  Skin: Negative.   Allergic/Immunologic: Negative.   Neurological:  Positive for weakness and headaches. Negative for dizziness, seizures and facial asymmetry.  Psychiatric/Behavioral:  Positive for sleep disturbance. Negative for hallucinations. The patient is not hyperactive.   All other systems reviewed and are negative.     Past Medical History:  Diagnosis Date    AAA (abdominal aortic aneurysm)      a.  CTA 7/19: measured 4.5 x 5.3 cm and greatest transverse dimensions   Abnormal nuclear stress test      a.  Myoview 2012: anterior wall ischemia with an estimated EF of 26%.  This was a new wall motion abnormality as well as a newly reduced EF   COPD (chronic obstructive pulmonary disease) (HCC)     Coronary artery disease      a.  Posterior MI in 2002 status post BMS to the LCx; b. [place] 2012: 95% stenosis of the proximal LAD, 95% stenosis of the mid LAD, 30% in-stent restenosis of the mid left circumflex with a second lesion of diffuse 50% stenosis.  The patient underwent successful PCI/BMS to the mid LAD with 0% residual stenosis, LV gram not performed   GERD (gastroesophageal reflux disease)     History of kidney stones     History of prostate cancer     Hyperlipidemia     Hypertension     Myocardial infarction [facility])     Neuromuscular disorder ([place])     Prostate cancer ([place])      a.  Status post seeding   PVD (peripheral vascular disease) ([place])     Stroke [facility])           Past Surgical History:  Procedure Laterality Date   BACK SURGERY   2009/2010    x 2   CARDIAC CATHETERIZATION   2002    stent placement [place]   ENDOVASCULAR  REPAIR/STENT GRAFT N/A 01/02/2018    Procedure: ENDOVASCULAR REPAIR/STENT GRAFT;  Surgeon: Algernon Huxley, MD;  Location: [facility];  Service: Cardiovascular;  Laterality: N/A;   FOOT SURGERY        bilateral    HERNIA REPAIR        bilateral    KNEE SURGERY        right knee    PROSTATE SURGERY   09/2009    prostate implant   SPINE SURGERY             Family History  Problem Relation Age of Onset   Heart attack Mother     Diabetes Mother     Heart disease Father     Lung cancer Daughter      Social History:  reports that he has been smoking cigarettes. He has a 14.00 pack-year smoking history. He has never used smokeless tobacco. He reports that he does not drink alcohol and does not use  drugs. Allergies:       Allergies  Allergen Reactions   Lyrica [Pregabalin]        Swelling     Prednisone        Feels like having a heart attack. Has had the injection form before and does well. Oral Steroids          Medications Prior to Admission  Medication Sig Dispense Refill   albuterol (PROVENTIL HFA;VENTOLIN HFA) 108 (90 Base) MCG/ACT inhaler Inhale 2 puffs into the lungs every 6 (six) hours as needed for wheezing. 1 Inhaler 5   aspirin 81 MG tablet Take 81 mg by mouth daily.       atorvastatin (LIPITOR) 80 MG tablet Take 1 tablet (80 mg total) by mouth daily. 90 tablet 3   budesonide-formoterol (SYMBICORT) 160-4.5 MCG/ACT inhaler Inhale 2 puffs into the lungs 2 (two) times daily. 1 Inhaler 5   carvedilol (COREG) 3.125 MG tablet Take 1 tablet (3.125 mg total) by mouth 2 (two) times daily. 180 tablet 3   montelukast (SINGULAIR) 10 MG tablet Take 10 mg by mouth daily.       Multiple Vitamins-Minerals (MULTIVITAMIN WITH MINERALS) tablet Take 1 tablet by mouth daily.       omeprazole (PRILOSEC) 40 MG capsule TAKE 1 CAPSULE BY MOUTH DAILY USUALLY 30MINUTES BEFORE BREAKFAST (Patient taking differently: Take 40 mg by mouth daily. 30 minutes before breakfast) 90 capsule 1   polyethylene glycol powder (GLYCOLAX/MIRALAX) powder Take 17 g by mouth daily as needed for mild constipation.       sacubitril-valsartan (ENTRESTO) 97-103 MG Take 1 tablet by mouth 2 (two) times daily. 180 tablet 3   traMADol (ULTRAM) 50 MG tablet Take 50 mg by mouth daily as needed for pain.       spironolactone (ALDACTONE) 25 MG tablet Take 0.5 tablets (12.5 mg total) by mouth daily. 45 tablet 3      Drug Regimen Review  Drug regimen was reviewed and remains appropriate with no significant issues identified     Home: Home Living Family/patient expects to be discharged to:: Private residence Living Arrangements: Spouse/significant other Available Help at Discharge: Family, Available 24 hours/day (children,  spouses and grandchildren to provide 24/7 care of couple at discharge) Type of Home: House Home Access: Ramped entrance Home Layout: One level Bathroom Shower/Tub: Multimedia programmer: Standard Bathroom Accessibility: Yes Home Equipment: Conservation officer, nature (2 wheels), [employer] - single point, Electronics engineer Comments: pt lives with his wife but she just  broke her hip for the second time (first time was 2/22). Family lives nearby but they work in the day.  Lives With: Spouse   Functional History: Prior Function Prior Level of Function : Independent/Modified Independent, Driving Mobility Comments: pt uses cane for "safety" per his report   Functional Status:  Mobility: Bed Mobility Overal bed mobility: Needs Assistance Bed Mobility: Supine to Sit, Sit to Supine Supine to sit: Min assist Sit to supine: Supervision General bed mobility comments: minA for trunk elevation Transfers Overall transfer level: Needs assistance Equipment used: Rolling walker (2 wheels) Transfers: Sit to/from Stand Sit to Stand: Min assist General transfer comment: minA for boost up into standing from low bed surface Ambulation/Gait Ambulation/Gait assistance: Min assist Gait Distance (Feet): 150 Feet Assistive device: Rolling walker (2 wheels) Gait Pattern/deviations: Step-to pattern, Decreased stride length, Shuffle, Trunk flexed, Wide base of support General Gait Details: very slow gait speed. Cues to increase speed with intermittent follow through. Cues required for close proximity throughout. Difficulty wayfinding back to room. Assist for balance, wayfinding, and RW management Gait velocity: decreased Gait velocity interpretation: <1.31 ft/sec, indicative of household ambulator   ADL: ADL Overall ADL's : Needs assistance/impaired Grooming: Wash/dry face, Oral care, Minimal assistance, Standing, Cueing for sequencing, Cueing for safety Grooming Details (indicate cue type and reason): sink  level, dependent on SUE for balance able to find objects on the left side today Upper Body Bathing: Moderate assistance Lower Body Bathing: Moderate assistance Upper Body Dressing : Moderate assistance Lower Body Dressing: Moderate assistance Toilet Transfer: Minimal assistance, Ambulation, BSC/3in1, Rolling walker (2 wheels), Grab bars, Cueing for safety, Cueing for sequencing Toilet Transfer Details (indicate cue type and reason): vc for safe hand placement Toileting- Clothing Manipulation and Hygiene: Moderate assistance, Sit to/from stand Functional mobility during ADLs: Minimal assistance, Rolling walker (2 wheels) General ADL Comments: increased time for all aspects of ADL/transfers   Cognition: Cognition Overall Cognitive Status: Impaired/Different from baseline Arousal/Alertness: Awake/alert Orientation Level: Oriented to person, Oriented to place, Oriented to time, Disoriented to situation Year: 2022 Month: November Day of Week: Incorrect Attention: Sustained Sustained Attention: Impaired Sustained Attention Impairment: Verbal basic, Functional basic Memory: Impaired Memory Impairment: Decreased short term memory, Decreased recall of new information, Retrieval deficit Decreased Short Term Memory: Verbal basic, Functional basic Immediate Memory Recall: Sock, Blue, Bed Memory Recall Sock: Without Cue Memory Recall Blue: Without Cue Memory Recall Bed: With Cue Awareness: Impaired Awareness Impairment: Other (comment), Intellectual impairment (reported confusion yesterday, but improving) Behaviors: Perseveration Safety/Judgment: Impaired Cognition Arousal/Alertness: Awake/alert Behavior During Therapy: WFL for tasks assessed/performed, Flat affect Overall Cognitive Status: Impaired/Different from baseline Area of Impairment: Safety/judgement, Awareness, Problem solving Orientation Level: Disoriented to, Time, Situation Current Attention Level: Sustained Memory: Decreased  recall of precautions, Decreased short-term memory Following Commands: Follows multi-step commands inconsistently Safety/Judgement: Decreased awareness of safety, Decreased awareness of deficits Awareness: Emergent Problem Solving: Slow processing, Requires verbal cues General Comments: continues to require cues for attention and memory     Blood pressure (!) 150/79, pulse 74, temperature 98.1 F (36.7 C), resp. rate 18, SpO2 94 %. Physical Exam Vitals and nursing note reviewed.  Constitutional:      Comments: Elderly male sitting up in bedside chair, overall appropriate; talked to wife on phone as well, NAD  HENT:     Head: Normocephalic.     Comments: Facial smile equal; tongue midline O2 by [place]- on neck, not in nose    Right Ear: External ear normal.  Left Ear: External ear normal.     Nose: Nose normal. No rhinorrhea.     Mouth/Throat:     Mouth: Mucous membranes are dry.     Pharynx: Oropharynx is clear. No oropharyngeal exudate.  Eyes:     General:        Right eye: No discharge.        Left eye: No discharge.     Extraocular Movements: Extraocular movements intact.  Cardiovascular:     Rate and Rhythm: Normal rate and regular rhythm.     Heart sounds: Normal heart sounds. No murmur heard.   No gallop.  Pulmonary:     Comments: CTA b?L, but decreased at bases B/L- coarse cough occ, but no wheezing/rales/rhonchi heard Abdominal:     Comments: Soft, but slightly distended; hypoactive; NT  Genitourinary:    Comments: Condom catheter in place- medium amber urine in bag Musculoskeletal:     Cervical back: Normal range of motion. No rigidity.     Comments: 5-/5 in UE and LE's B/L- throughout  Skin:    Comments: Significant ecchymoses on arms- B/L UE IV's- L hand and R forearm   Neurological:     Mental Status: He is alert.     Comments: Intact to light touch in all 4 extremities Appropriate- Ox2-3- slightly confused on hospital course more than anything   Psychiatric:     Comments: Flat, irritable      Lab Results Last 48 Hours        Results for orders placed or performed during the hospital encounter of 02/21/21 (from the past 48 hour(s))  Glucose, capillary     Status: Abnormal    Collection Time: 02/24/21  4:01 PM  Result Value Ref Range    Glucose-Capillary 115 (H) 70 - 99 mg/dL      Comment: Glucose reference range applies only to samples taken after fasting for at least 8 hours.  Glucose, capillary     Status: Abnormal    Collection Time: 02/24/21  8:14 PM  Result Value Ref Range    Glucose-Capillary 149 (H) 70 - 99 mg/dL      Comment: Glucose reference range applies only to samples taken after fasting for at least 8 hours.  Glucose, capillary     Status: Abnormal    Collection Time: 02/24/21 11:19 PM  Result Value Ref Range    Glucose-Capillary 136 (H) 70 - 99 mg/dL      Comment: Glucose reference range applies only to samples taken after fasting for at least 8 hours.  Glucose, capillary     Status: Abnormal    Collection Time: 02/25/21  4:32 AM  Result Value Ref Range    Glucose-Capillary 118 (H) 70 - 99 mg/dL      Comment: Glucose reference range applies only to samples taken after fasting for at least 8 hours.  Glucose, capillary     Status: Abnormal    Collection Time: 02/25/21  8:07 AM  Result Value Ref Range    Glucose-Capillary 114 (H) 70 - 99 mg/dL      Comment: Glucose reference range applies only to samples taken after fasting for at least 8 hours.  Glucose, capillary     Status: Abnormal    Collection Time: 02/25/21 12:25 PM  Result Value Ref Range    Glucose-Capillary 115 (H) 70 - 99 mg/dL      Comment: Glucose reference range applies only to samples taken after fasting for at least 8 hours.  Glucose, capillary     Status: Abnormal    Collection Time: 02/25/21  4:29 PM  Result Value Ref Range    Glucose-Capillary 111 (H) 70 - 99 mg/dL      Comment: Glucose reference range applies only to samples taken  after fasting for at least 8 hours.  Glucose, capillary     Status: Abnormal    Collection Time: 02/25/21  7:18 PM  Result Value Ref Range    Glucose-Capillary 110 (H) 70 - 99 mg/dL      Comment: Glucose reference range applies only to samples taken after fasting for at least 8 hours.  Glucose, capillary     Status: Abnormal    Collection Time: 02/25/21 11:31 PM  Result Value Ref Range    Glucose-Capillary 147 (H) 70 - 99 mg/dL      Comment: Glucose reference range applies only to samples taken after fasting for at least 8 hours.  Glucose, capillary     Status: Abnormal    Collection Time: 02/26/21  4:16 AM  Result Value Ref Range    Glucose-Capillary 153 (H) 70 - 99 mg/dL      Comment: Glucose reference range applies only to samples taken after fasting for at least 8 hours.  Glucose, capillary     Status: Abnormal    Collection Time: 02/26/21  8:05 AM  Result Value Ref Range    Glucose-Capillary 166 (H) 70 - 99 mg/dL      Comment: Glucose reference range applies only to samples taken after fasting for at least 8 hours.  Glucose, capillary     Status: Abnormal    Collection Time: 02/26/21 11:26 AM  Result Value Ref Range    Glucose-Capillary 114 (H) 70 - 99 mg/dL      Comment: Glucose reference range applies only to samples taken after fasting for at least 8 hours.      Imaging Results (Last 48 hours)  No results found.     Assessment/Plan:         Medical Problem List and Plan:   SAH/IPH due to hypertensive emergency- with impaired ADLs/mobility Antithrombotics . DVT Prophylaxis/Anticoagulation: Mechanical: Sequential compression devices, below knee Bilateral lower extremities due to brain bleed 2. Pain Management: Norco for HA as needed and tylenol prn- will monitor for needing daily prevention meds for HA 3. Mood: emotional support- and monitor if needs neuropsychology.  4. Neuropsych- the patient is capable of making decisions for himself.  5. Skin/Wounds- routine  wound care.  6. Fluids/Electrolytes/Nutrition: Routine in and outs with follow-up chemistries 7. HTN in setting of hypertensive emergency- BP is much better controlled- con't Carvedilol- was getting IV hydralazine prn on Acute- might need PO hydralazine if SBP gets >180 8. CHF with EF 30-35%- con't Entresto and monitor 9. COPD- con't O2 and wean as tolerated-also continue his regimen of COPD meds- including Pulmicort, Albuterol prn, Brovana. And change as required.  10. Constipation- will order Sorbitol since it's been 1 week since last BM- and con't miralax prn and Colace.  11. UTI- resolved- s/p IV rocephin 12. Smoker- tobacco- abuse- counseling and Nicoderm 21 mg daily 13. Daily headaches- con't Norco for now- but might benefit from Topamax.  14. Leg spasms- is chronic- cannot tell is restless leg syndrome or myoclonus- will monitor- might need to address since was very active.  15. Hx of prostate CA s/p XRT seeding 16. GERD- con't protonix and monitor/change as required 17. AAA/CAD/HLD- con't to monitor  Patient Active Problem List    Diagnosis Date Noted   SDH (subdural hematoma) 02/21/2021   Syncope 02/21/2021   Fall 02/21/2021   Hypertensive urgency 02/21/2021   HTN (hypertension) 02/21/2021   Stroke ([place]) 02/21/2021   Chronic combined systolic and diastolic CHF (congestive heart failure) ([place]) 02/21/2021   Leukocytosis 02/21/2021   Elevated troponin 02/21/2021   Zygomatic arch fracture ([place]) 02/21/2021   UTI (urinary tract infection) 02/21/2021   Sepsis ([place]) 02/21/2021   Other spondylosis with radiculopathy, lumbar region 09/04/2018   Centrilobular emphysema ([place]) 07/19/2018   Dilated cardiomyopathy ([place]) 07/19/2018   Coronary artery disease of native artery of native heart with stable angina pectoris ([place]) 01/20/2018   AAA (abdominal aortic aneurysm) without rupture 10/30/2017   Personal history of tobacco use, presenting hazards to health 08/29/2016   COPD  exacerbation ([place]) 04/03/2015   Medicare annual wellness visit, subsequent 05/23/2014   Barrett's esophagus 05/20/2014   Constipation 05/20/2014   Arthritis of hand 05/07/2014   COPD (chronic obstructive pulmonary disease) ([place]) 11/06/2011   Pulmonary nodule 11/06/2011   Tobacco abuse 02/12/2011   Tobacco abuse counseling 02/12/2011   CAD, NATIVE VESSEL 10/18/2008   COLONIC POLYPS 08/05/2008   Hyperlipidemia 08/05/2008   Essential hypertension 08/05/2008   GERD 08/05/2008      Davaris Youtsey 02/26/2021, 2:17 PM

## 2021-02-26 NOTE — Progress Notes (Addendum)
Pt admitted to rehab, A&O 3-4. Pt made aware of rehab policies and oriented to floor. Pt very tired and complaining of a headache. Stage 2 sacrum, measured and foam dressing applied. IV to R FA.  [name] Allegra Grana

## 2021-02-27 DIAGNOSIS — E440 Moderate protein-calorie malnutrition: Secondary | ICD-10-CM

## 2021-02-27 DIAGNOSIS — I5042 Chronic combined systolic (congestive) and diastolic (congestive) heart failure: Secondary | ICD-10-CM

## 2021-02-27 DIAGNOSIS — I10 Essential (primary) hypertension: Secondary | ICD-10-CM

## 2021-02-27 DIAGNOSIS — I619 Nontraumatic intracerebral hemorrhage, unspecified: Secondary | ICD-10-CM | POA: Diagnosis not present

## 2021-02-27 DIAGNOSIS — K5901 Slow transit constipation: Secondary | ICD-10-CM | POA: Diagnosis not present

## 2021-02-27 MED ORDER — SPIRONOLACTONE 12.5 MG HALF TABLET
12.5000 mg | ORAL_TABLET | Freq: Every day | ORAL | Status: DC
  Administered 2021-02-27 – 2021-03-16 (×18): 12.5 mg via ORAL
  Filled 2021-02-27 (×18): qty 1

## 2021-02-27 MED ORDER — TRAZODONE HCL 50 MG PO TABS
50.0000 mg | ORAL_TABLET | Freq: Every day | ORAL | Status: DC
  Administered 2021-02-27 – 2021-03-02 (×4): 50 mg via ORAL
  Filled 2021-02-27 (×4): qty 1

## 2021-02-27 MED ORDER — CLONIDINE HCL 0.1 MG PO TABS: 0.1000 mg | ORAL_TABLET | Freq: Four times a day (QID) | ORAL | Status: DC | PRN

## 2021-02-27 MED ORDER — HYDRALAZINE HCL 25 MG PO TABS: 25.0000 mg | ORAL_TABLET | Freq: Three times a day (TID) | ORAL | Status: DC | PRN

## 2021-02-27 MED ORDER — HYDRALAZINE HCL 10 MG PO TABS
10.0000 mg | ORAL_TABLET | Freq: Three times a day (TID) | ORAL | Status: DC
  Administered 2021-02-27 – 2021-03-05 (×18): 10 mg via ORAL
  Filled 2021-02-27 (×19): qty 1

## 2021-02-27 MED ORDER — SORBITOL 70 % SOLN
60.0000 mL | Freq: Once | Status: AC
  Administered 2021-02-27: 60 mL via ORAL

## 2021-02-27 NOTE — Progress Notes (Signed)
PROGRESS NOTE   Subjective/Complaints: Difficulties with BP last night. Nurse reports pt with confusion. Pt in bed awake, sl disoriented. No distress  ROS: Limited due to cognitive/behavioral    Objective:   No results found. No results for input(s): WBC, HGB, HCT, PLT in the last 72 hours. No results for input(s): NA, K, CL, CO2, GLUCOSE, BUN, CREATININE, CALCIUM in the last 72 hours.  Intake/Output Summary (Last 24 hours) at 02/27/2021 1017 Last data filed at 02/27/2021 0900 Gross per 24 hour  Intake 120 ml  Output --  Net 120 ml     Pressure Injury 02/26/21 Coccyx Mid;Posterior Stage 2 -  Partial thickness loss of dermis presenting as a shallow open injury with a red, pink wound bed without slough. (Active)  02/26/21 1702  Location: Coccyx  Location Orientation: Mid;Posterior  Staging: Stage 2 -  Partial thickness loss of dermis presenting as a shallow open injury with a red, pink wound bed without slough.  Wound Description (Comments):   Present on Admission: Yes    Physical Exam: Vital Signs Blood pressure (!) 165/81, pulse 60, temperature 98.6 F (37 C), resp. rate 16, SpO2 96 %.  General: Alert, No apparent distress HEENT: Head is normocephalic, atraumatic, PERRLA, EOMI, sclera anicteric, oral mucosa pink and moist, dentition intact, ext ear canals clear, O2 [place]ck: Supple without JVD or lymphadenopathy Heart: Reg rate and rhythm. No murmurs rubs or gallops Chest: CTA bilaterally without wheezes except for decreased sounds at bases? Abdomen: Soft, non-tender, non-distended, bowel sounds positive. Extremities: No clubbing, cyanosis, or edema. Pulses are 2+ Psych: Pt's affect is appropriate. Pt is cooperative Skin: scattered bruises. Neuro:  oriented to person, ?hospital. Follows basic commands. Distracted. Moves all 4's. Seems to sense  pain. Musculoskeletal: no pain with ROM/palpation     Assessment/Plan: 1. Functional deficits which require 3+ hours per day of interdisciplinary therapy in a comprehensive inpatient rehab setting. Physiatrist is providing close team supervision and 24 hour management of active medical problems listed below. Physiatrist and rehab team continue to assess barriers to discharge/monitor patient progress toward functional and medical goals  Care Tool:  Bathing              Bathing assist       Upper Body Dressing/Undressing Upper body dressing        Upper body assist      Lower Body Dressing/Undressing Lower body dressing            Lower body assist       Toileting Toileting    Toileting assist       Transfers Chair/bed transfer  Transfers assist           Locomotion Ambulation   Ambulation assist              Walk 10 feet activity   Assist           Walk 50 feet activity   Assist           Walk 150 feet activity   Assist           Walk 10 feet on uneven surface  activity   Assist  Wheelchair     Assist               Wheelchair 50 feet with 2 turns activity    Assist            Wheelchair 150 feet activity     Assist          Blood pressure (!) 165/81, pulse 60, temperature 98.6 F (37 C), resp. rate 16, SpO2 96 %.  Medical Problem List and Plan:   SAH/IPH due to hypertensive emergency- with impaired ADLs/mobility  -Patient is beginning CIR therapies today including PT, OT, and SLP  Antithrombotics . DVT Prophylaxis/Anticoagulation: Mechanical: Sequential compression devices, below knee Bilateral lower extremities due to brain bleed 2. Pain Management: Norco for HA as needed and tylenol prn- will monitor for needing daily prevention meds for HA 3. Mood/sleep: emotional support- and monitor if needs  neuropsychology.   -check sleep chart  -schedule trazodone at night 4. Neuropsych- the patient is not  capable of  making decisions for himself.  5. Skin/Wounds- routine wound care.  6. Fluids/Electrolytes/Nutrition: eating next to nothing  -encourage PO  -cognitive component 7. HTN in setting of hypertensive emergency-   -scheduled coreg, entresto,  and aldactone  -prn catapres and hydralazine  -SLOWLY decrease bp  -will add low dose scheduled hydralazine 10mg  tid 8. CHF with EF 30-35%- con't Entresto and monitor  There were no vitals filed for this visit. Need weights 9. COPD- con't O2 and wean as tolerated-also continue his regimen of COPD meds- including Pulmicort, Albuterol prn, Brovana. And change as required.  10. Constipation- will order Sorbitol since it's been 1 week since last BM- and con't miralax prn and Colace.   11/13 still no bm  -sorbitol today 11. UTI- resolved- s/p IV rocephin 12. Smoker- tobacco- abuse- counseling and Nicoderm 21 mg daily 13. Daily headaches- con't Norco for now- but might benefit from Topamax.  14. Leg spasms- is chronic- cannot tell is restless leg syndrome or myoclonus- will monitor- might need to address since was very active.  15. Hx of prostate CA s/p XRT seeding 16. GERD- con't protonix and monitor/change as required 17. AAA/CAD/HLD- con't to monitor    LOS: 1 days A FACE TO FACE EVALUATION WAS PERFORMED  Meredith Staggers 02/27/2021, 10:17 AM

## 2021-02-27 NOTE — Progress Notes (Addendum)
Inpatient Rehabilitation Admission Medication Review by a Pharmacist  A complete drug regimen review was completed for this patient to identify any potential clinically significant medication issues.  High Risk Drug Classes Is patient taking? Indication by Medication  Antipsychotic Yes Prochlorperazine - nausea prn  Anticoagulant No   Antibiotic No   Opioid Yes Hydrocodone-Acetaminophen prn pain  Antiplatelet No   Hypoglycemics/insulin No   Vasoactive Medication Yes Spironolactone - HF/BP Carvedilol - HF/BP Entresto  - HF/BP Clonidine - BP Hydralazine - BP  Chemotherapy No   Other Yes Trazodone - sleep     Type of Medication Issue Identified Description of Issue Recommendation(s)  Drug Interaction(s) (clinically significant)     Duplicate Therapy     Allergy     No Medication Administration End Date     Incorrect Dose     Additional Drug Therapy Needed  PTA montelukast not ordered PTA atorvastatin not ordered PTA aspirin 81 not ordered Re-order medications as able  Significant med changes from prior encounter (inform family/care partners about these prior to discharge).    Other       Clinically significant medication issues were identified that warrant physician communication and completion of prescribed/recommended actions by midnight of the next day:  No  Name of provider notified for urgent issues identified: none  Provider Method of Notification: N/A  Pharmacist comments: Please order home meds above as able  Time spent performing this drug regimen review (minutes):  20 minutes  Thank you for involving pharmacy in this patient's care.  Elita Quick, PharmD PGY1 Ambulatory Care Pharmacy Resident 02/27/2021 3:09 PM  **Pharmacist phone directory can be found on [email or web address] listed under [place]**

## 2021-02-27 NOTE — Evaluation (Signed)
Occupational Therapy Assessment and Plan  Patient Details  Name: [name] MRN: [id number] Date of Birth: 01-06-1946  OT Diagnosis: apraxia, cognitive deficits, and muscle weakness (generalized) Rehab Potential: Rehab Potential (ACUTE ONLY): Good ELOS: 2 weeks   Today's Date: 02/27/2021 OT Individual Time: 1004-1100 OT Individual Time Calculation (min): 56 min     Hospital Problem: Principal Problem:   Intraparenchymal hemorrhage of brain [facility])   Past Medical History:  Past Medical History:  Diagnosis Date   AAA (abdominal aortic aneurysm)    a.  CTA 7/19: measured 4.5 x 5.3 cm and greatest transverse dimensions   Abnormal nuclear stress test    a.  Myoview 2012: anterior wall ischemia with an estimated EF of 26%.  This was a new wall motion abnormality as well as a newly reduced EF   COPD (chronic obstructive pulmonary disease) (HCC)    Coronary artery disease    a.  Posterior MI in 2002 status post BMS to the LCx; b. [place] 2012: 95% stenosis of the proximal LAD, 95% stenosis of the mid LAD, 30% in-stent restenosis of the mid left circumflex with a second lesion of diffuse 50% stenosis.  The patient underwent successful PCI/BMS to the mid LAD with 0% residual stenosis, LV gram not performed   GERD (gastroesophageal reflux disease)    History of kidney stones    History of prostate cancer    Hyperlipidemia    Hypertension    Myocardial infarction [facility])    Neuromuscular disorder ([place])    Prostate cancer ([place])    a.  Status post seeding   PVD (peripheral vascular disease) ([place])    Stroke [facility])    Past Surgical History:  Past Surgical History:  Procedure Laterality Date   BACK SURGERY  2009/2010   x 2   CARDIAC CATHETERIZATION  2002   stent placement    ENDOVASCULAR REPAIR/STENT GRAFT N/A 01/02/2018   Procedure: ENDOVASCULAR REPAIR/STENT GRAFT;  Surgeon: Algernon Huxley, MD;  Location: [place] [facility];  Service: Cardiovascular;  Laterality: N/A;   FOOT  SURGERY     bilateral    HERNIA REPAIR     bilateral    KNEE SURGERY     right knee    PROSTATE SURGERY  09/2009   prostate implant   SPINE SURGERY      Assessment & Plan Clinical Impression: Admitted with syncope and fall in ER while accompanying his wife s/p fall. BP of 233/141 in ED. Imaging (+) SDH, IPH and cerebral edema. ETT 11/7-11/8. Did not require OR/intervention for SDH. Admission complicated by E Coli UTI. Patient transferred to CIR on 02/26/2021 .    Patient currently requires min with basic self-care skills secondary to impaired timing and sequencing, unbalanced muscle activation, motor apraxia, decreased coordination, and decreased motor planning, decreased midline orientation and decreased attention to right, and decreased initiation, decreased attention, decreased awareness, decreased problem solving, decreased safety awareness, decreased memory, and delayed processing.  Prior to hospitalization, patient could complete ADLs with modified independence.  Patient will benefit from skilled intervention to decrease level of assist with basic self-care skills and increase independence with basic self-care skills prior to discharge home with family.  Anticipate patient will require 24 hour supervision and follow up outpatient.  OT - End of Session Activity Tolerance: Improving Endurance Deficit: Yes Endurance Deficit Description: Fatigues quickly; requires seated rest breaks OT Assessment Rehab Potential (ACUTE ONLY): Good OT Patient demonstrates impairments in the following area(s): Balance;Cognition;Endurance;Motor;Perception;Safety OT Basic ADL's  Functional Problem(s): Grooming;Bathing;Dressing;Toileting OT Transfers Functional Problem(s): Toilet;Tub/Shower OT Additional Impairment(s): None OT Plan OT Intensity: Minimum of 1-2 x/day, 45 to 90 minutes OT Frequency: 5 out of 7 days OT Duration/Estimated Length of Stay: 2 weeks OT Treatment/Interventions: Balance/vestibular  training;Cognitive remediation/compensation;Community reintegration;Discharge planning;Disease mangement/prevention;DME/adaptive equipment instruction;Functional mobility training;Neuromuscular re-education;Patient/family education;Self Care/advanced ADL retraining;Therapeutic Activities;Therapeutic Exercise;UE/LE Strength taining/ROM;UE/LE Coordination activities;Visual/perceptual remediation/compensation;Wheelchair propulsion/positioning OT Self Feeding Anticipated Outcome(s): N/A OT Basic Self-Care Anticipated Outcome(s): supervision OT Toileting Anticipated Outcome(s): supervision OT Bathroom Transfers Anticipated Outcome(s): supervision OT Recommendation Patient destination: Home Follow Up Recommendations: Outpatient OT Equipment Recommended: To be determined   OT Evaluation Precautions/Restrictions  Precautions Precautions: Fall Restrictions Weight Bearing Restrictions: No General Chart Reviewed: Yes Additional Pertinent History: AAA, COPD, CAD s/p MI 2002, GERD, prostate cancer, HLD, HTN, PVD, Hx of CVA, and Hx of spinal surgery Family/Caregiver Present: No Vital Signs Therapy Vitals BP: (!) 173/99 Patient Position (if appropriate): Lying Oxygen Therapy SpO2: 96 % O2 Device: Room Air Pain Pain Assessment Pain Scale: Faces Pain Score: 0-No pain Faces Pain Scale: Hurts little more Pain Type: Acute pain Pain Location: Shoulder Pain Descriptors / Indicators: Sore Home Living/Prior Functioning Home Living Available Help at Discharge: Family, Available 24 hours/day (Per med chart children and grandchildren to provide 24/7 care at time of d/c) Type of Home: House Home Access: Ramped entrance Home Layout: One level Bathroom Shower/Tub: Multimedia programmer: Standard Bathroom Accessibility: Yes Additional Comments: pt lives with his wife but she just broke her hip for the second time (first time was 2/22). Family lives nearby.  Lives With: Spouse Prior  Function Level of Independence: Independent with basic ADLs, Independent with homemaking with ambulation Vision Baseline Vision/History: 1 Wears glasses Ability to See in Adequate Light: 0 Adequate Patient Visual Report: No change from baseline Vision Assessment?: No apparent visual deficits Perception  Inattention/Neglect: Other (comment) (Mild L inattention. Does attend to L visual field during seated ADLs. Poor overall body awareness.) Praxis Praxis Impairment Details: Motor planning;Perseveration Cognition Overall Cognitive Status: Impaired/Different from baseline Arousal/Alertness: Awake/alert Orientation Level: Person;Place Year: 2022 Month: November Day of Week: Incorrect Memory: Impaired Memory Impairment: Decreased short term memory;Decreased recall of new information;Retrieval deficit Decreased Short Term Memory: Verbal basic;Functional basic Immediate Memory Recall: Sock;Blue;Bed Memory Recall Sock: Without Cue Memory Recall Blue: Without Cue Memory Recall Bed: Not able to recall Attention: Sustained Sustained Attention: Impaired Sustained Attention Impairment: Verbal basic;Functional basic Awareness: Impaired Awareness Impairment: Other (comment);Intellectual impairment Behaviors: Perseveration Safety/Judgment: Impaired Comments: Decreased knowledge of current deficits and decreased knowledge of need for external assistance. Sensation Sensation Light Touch: Appears Intact Hot/Cold: Appears Intact Coordination Gross Motor Movements are Fluid and Coordinated: No Fine Motor Movements are Fluid and Coordinated: No Coordination and Movement Description: Decreased bimanual coordination. L disdiadochokinesia. Finger Nose Finger Test: Impaired L>R. Perseverates. Repeat cueing required. Motor  Motor Motor: Hemiplegia;Motor perseverations;Motor apraxia;Abnormal postural alignment and control Motor - Skilled Clinical Observations: Mild L weakness; R lateral lean;  posterior bias  Trunk/Postural Assessment  Cervical Assessment Cervical Assessment: Within Functional Limits Thoracic Assessment Thoracic Assessment: Exceptions to WFL (Mild kyphosis) Lumbar Assessment Lumbar Assessment: Within Functional Limits Postural Control Postural Control: Deficits on evaluation Righting Reactions: Delayed; insufficient Protective Responses: Delayed; insufficient  Balance Balance Balance Assessed: Yes Static Sitting Balance Static Sitting - Balance Support: No upper extremity supported;Feet supported Static Sitting - Level of Assistance:  (Min guard) Dynamic Sitting Balance Dynamic Sitting - Balance Support: No upper extremity supported;Feet supported Dynamic Sitting - Level of Assistance: 4: Min assist  Dynamic Sitting - Balance Activities: Forward lean/weight shifting;Lateral lean/weight shifting Static Standing Balance Static Standing - Balance Support: Bilateral upper extremity supported;During functional activity Static Standing - Level of Assistance: 4: Min assist Dynamic Standing Balance Dynamic Standing - Balance Support: Bilateral upper extremity supported;During functional activity Dynamic Standing - Level of Assistance: 4: Min assist Extremity/Trunk Assessment RUE Assessment RUE Assessment: Within Functional Limits LUE Assessment LUE Assessment: Exceptions to [facility] LUE Body System: Neuro Brunstrum levels for arm and hand: Arm Brunstrum level for arm: Stage IV Movement is deviating from synergy  Care Tool Care Tool Self Care Eating   Eating Assist Level: Set up assist    Oral Care    Oral Care Assist Level: Set up assist (sitting)    Bathing   Body parts bathed by patient: Right arm;Left arm;Chest;Abdomen;Front perineal area;Right upper leg;Left upper leg;Right lower leg;Left lower leg;Face Body parts bathed by helper: Buttocks   Assist Level: Minimal Assistance - Patient > 75%    Upper Body Dressing(including orthotics)   What is the  patient wearing?: Pull over shirt   Assist Level: Set up assist    Lower Body Dressing (excluding footwear)   What is the patient wearing?: Underwear/pull up;Pants Assist for lower body dressing: Minimal Assistance - Patient > 75%    Putting on/Taking off footwear   What is the patient wearing?: Socks;Shoes Assist for footwear: Minimal Assistance - Patient > 75%       Care Tool Toileting Toileting activity Toileting Activity did not occur Landscape architect and hygiene only): N/A (no void or bm)       Care Tool Bed Mobility Roll left and right activity   Roll left and right assist level: Supervision/Verbal cueing    Sit to lying activity   Sit to lying assist level: Minimal Assistance - Patient > 75%    Lying to sitting on side of bed activity   Lying to sitting on side of bed assist level: the ability to move from lying on the back to sitting on the side of the bed with no back support.: Minimal Assistance - Patient > 75%     Care Tool Transfers Sit to stand transfer   Sit to stand assist level: Minimal Assistance - Patient > 75%    Chair/bed transfer   Chair/bed transfer assist level: Minimal Assistance - Patient > 75%     Toilet transfer         Care Tool Cognition  Expression of Ideas and Wants Expression of Ideas and Wants: 3. Some difficulty - exhibits some difficulty with expressing needs and ideas (e.g, some words or finishing thoughts) or speech is not clear  Understanding Verbal and Non-Verbal Content Understanding Verbal and Non-Verbal Content: 3. Usually understands - understands most conversations, but misses some part/intent of message. Requires cues at times to understand   Memory/Recall Ability Memory/Recall Ability : That he or she is in a hospital/hospital unit   Refer to Care Plan for Long Term Goals  SHORT TERM GOAL WEEK 1 OT Short Term Goal 1 (Week 1): Patient will complete toilet transfer with Min guard. OT Short Term Goal 2 (Week 1): Patient  will complete shower transfer with Min guard. OT Short Term Goal 3 (Week 1): Patient will don LB clothing with Min gaurd.  Recommendations for other services: Other: TBD    Skilled Therapeutic Intervention Patient met lying supine in bed asleep requiring increased time to awaken. Reports pain in bilateral shoulders at rest 2/2 fall. Education provided on  purpose of rehab, role of OT, goals and ELOS. Patient expressed verbal understanding but would benefit from continued education. UB/LB bathing/dressing completed seated EOB with Min A overall as patient hypertensive with BP 173/99. Limited mobility in room with Min A to prevent posterior LOB. Shuffle-like steps noted with very narrow BOS. Patient limited by deficits listed above including decreased cognition, apraxia and decreased standing balance and would benefit from CIR level therapies to maximize safety/independence with self-care tasks in prep for safe d/c home with family. Session concluded with patient lying supine in bed with call bell within reach, bed alarm activated and all needs met. Telesitter present in room.   ADL ADL Grooming: Setup Where Assessed-Grooming: Chair Upper Body Bathing: Setup Where Assessed-Upper Body Bathing: Edge of bed Lower Body Bathing: Minimal assistance Where Assessed-Lower Body Bathing: Edge of bed Upper Body Dressing: Setup Where Assessed-Upper Body Dressing: Edge of bed Lower Body Dressing: Minimal assistance Where Assessed-Lower Body Dressing: Edge of bed Toileting: Not assessed Toilet Transfer Method: Not assessed Tub/Shower Transfer: Not assessed ADL Comments: Limited 2/2 hypertension Mobility  Bed Mobility Bed Mobility: Rolling Right;Rolling Left;Supine to Sit;Sit to Supine;Sitting - Scoot to [employer] of Bed Rolling Right: Supervision/verbal cueing Rolling Left: Supervision/Verbal cueing Supine to Sit: Minimal Assistance - Patient > 75% Sitting - Scoot to [employer] of Bed: Contact Guard/Touching  assist Sit to Supine: Contact Guard/Touching assist Transfers Sit to Stand: Minimal Assistance - Patient > 75% Stand to Sit: Minimal Assistance - Patient > 75%   Discharge Criteria: Patient will be discharged from OT if patient refuses treatment 3 consecutive times without medical reason, if treatment goals not met, if there is a change in medical status, if patient makes no progress towards goals or if patient is discharged from hospital.  The above assessment, treatment plan, treatment alternatives and goals were discussed and mutually agreed upon: by patient  Caroll Cunnington R [place] 02/27/2021, 1:04 PM

## 2021-02-27 NOTE — Evaluation (Signed)
Physical Therapy Assessment and Plan  Patient Details  Name: DJANGO NGUYEN MRN: [id number] Date of Birth: 10-17-1945  PT Diagnosis: Abnormal posture, Abnormality of gait, Cognitive deficits, Coordination disorder, Difficulty walking, Hemiplegia non-dominant, and Muscle weakness Rehab Potential: Good ELOS: 2-2.5 weeks   Today's Date: 02/27/2021 PT Individual Time: 1302-1400 PT Individual Time Calculation (min): 58 min    Hospital Problem: Principal Problem:   Intraparenchymal hemorrhage of brain [facility])   Past Medical History:  Past Medical History:  Diagnosis Date   AAA (abdominal aortic aneurysm)    a.  CTA 7/19: measured 4.5 x 5.3 cm and greatest transverse dimensions   Abnormal nuclear stress test    a.  Myoview 2012: anterior wall ischemia with an estimated EF of 26%.  This was a new wall motion abnormality as well as a newly reduced EF   COPD (chronic obstructive pulmonary disease) (HCC)    Coronary artery disease    a.  Posterior MI in 2002 status post BMS to the LCx; b. [place] 2012: 95% stenosis of the proximal LAD, 95% stenosis of the mid LAD, 30% in-stent restenosis of the mid left circumflex with a second lesion of diffuse 50% stenosis.  The patient underwent successful PCI/BMS to the mid LAD with 0% residual stenosis, LV gram not performed   GERD (gastroesophageal reflux disease)    History of kidney stones    History of prostate cancer    Hyperlipidemia    Hypertension    Myocardial infarction [facility])    Neuromuscular disorder ([place])    Prostate cancer ([place])    a.  Status post seeding   PVD (peripheral vascular disease) ([place])    Stroke [facility])    Past Surgical History:  Past Surgical History:  Procedure Laterality Date   BACK SURGERY  2009/2010   x 2   CARDIAC CATHETERIZATION  2002   stent placement [place]   ENDOVASCULAR REPAIR/STENT GRAFT N/A 01/02/2018   Procedure: ENDOVASCULAR REPAIR/STENT GRAFT;  Surgeon: Algernon Huxley, MD;  Location: [facility];   Service: Cardiovascular;  Laterality: N/A;   FOOT SURGERY     bilateral    HERNIA REPAIR     bilateral    KNEE SURGERY     right knee    PROSTATE SURGERY  09/2009   prostate implant   SPINE SURGERY      Assessment & Plan Clinical Impression: Patient is a 75 year old male with medical history of CHF with EF 30 to 35%, HTN, HLD, CVA, GERD, COPD, PVD, prostate cancer s/p XRT seeding, CAD, AAA, tobacco abuse, Barrett's esophagus who presented on 02/21/21 with syncope and a fall. Patient was in the ED at [facility] where his wife was a patient. Staff suddenly heard wife screaming and found patient unconscious on the floor with epistaxis and urinary incontinence. No seizure activity. Confused when regained consciousness and mildly lethargic. BP 233/141 and nicardipine drip started .    CT scan performed showing small SDH.  Neurosurgery consulted. [facility] neurosurgeon recommended transfer to [facility] for higher level of care. Patient arrived to [facility] intubated for airway protection from [facility]. Dr Christella Noa consulted at [facility] and felt no operative indication  needed. Neurology consulted with EEG suggestive of cortical dysfunction in right hemisphere likely due to underlying SDH. Also moderate to severe diffuse encephalopathy. Nonspecific etiology but could be due to sedation. No seizures or epileptiform discharges seen. Extubated on 11/8. Acute UTI due to E. Coli  treated with IV Ceftriaxone for 5 days.  Repeat CT showed stability of SDH and Intraparenchymal hemorrhage. Cerebral edema resolved. Holding antiplatelet and anticoagulation. Patient transferred to CIR on 02/26/2021 .   Patient currently requires min with mobility secondary to muscle weakness, decreased cardiorespiratoy endurance, impaired timing and sequencing, abnormal tone, motor apraxia, decreased coordination, and decreased motor planning, decreased attention to left, decreased initiation, decreased attention, decreased awareness, decreased problem solving,  decreased safety awareness, decreased memory, and delayed processing, and decreased sitting balance, decreased standing balance, decreased postural control, hemiplegia, and decreased balance strategies.  Prior to hospitalization, patient was modified independent  with mobility and lived with Spouse in a House home.  Home access is  Ramped entrance.  Patient will benefit from skilled PT intervention to maximize safe functional mobility, minimize fall risk, and decrease caregiver burden for planned discharge home with 24 hour supervision.  Anticipate patient will benefit from follow up [place] at discharge.  PT - End of Session Activity Tolerance: Tolerates 30+ min activity with multiple rests Endurance Deficit: Yes Endurance Deficit Description: Requires multiple rest breaks with minimal activity PT Assessment Rehab Potential (ACUTE/IP ONLY): Good PT Barriers to Discharge: Decreased caregiver support;Home environment access/layout;Lack of/limited family support;Behavior PT Patient demonstrates impairments in the following area(s): Balance;Nutrition;Skin Integrity;Pain;Behavior;Edema;Perception;Safety;Endurance;Motor;Sensory PT Transfers Functional Problem(s): Bed Mobility;Bed to Chair;Car;Furniture PT Locomotion Functional Problem(s): Ambulation;Wheelchair Mobility;Stairs PT Plan PT Intensity: Minimum of 1-2 x/day ,45 to 90 minutes PT Frequency: 5 out of 7 days PT Duration Estimated Length of Stay: 2-2.5 weeks PT Treatment/Interventions: Ambulation/gait training;Cognitive remediation/compensation;Discharge planning;DME/adaptive equipment instruction;Functional mobility training;Pain management;Psychosocial support;Splinting/orthotics;Therapeutic Activities;UE/LE Strength taining/ROM;Visual/perceptual remediation/compensation;UE/LE Coordination activities;Therapeutic Exercise;Wheelchair propulsion/positioning;Stair training;Skin care/wound management;Patient/family education;Neuromuscular  re-education;Functional electrical stimulation;Disease management/prevention;Community reintegration;Balance/vestibular training PT Transfers Anticipated Outcome(s): Supervision using LRAD PT Locomotion Anticipated Outcome(s): Supervision using LRAD >150 ft PT Recommendation Recommendations for Other Services: Neuropsych consult Follow Up Recommendations: Home health PT Patient destination: Home Equipment Recommended: To be determined   PT Evaluation Precautions/Restrictions Precautions Precautions: Fall Precaution Comments: Labile BP, monitor with mobility Restrictions Weight Bearing Restrictions: No Pain Interference Pain Interference Pain Effect on Sleep: 1. Rarely or not at all Pain Interference with Therapy Activities: 1. Rarely or not at all Pain Interference with Day-to-Day Activities: 2. Occasionally Home Living/Prior Functioning Home Living Available Help at Discharge: Family;Available 24 hours/day (Per med chart children and grandchildren to provide 24/7 care at time of d/c) Type of Home: House Home Access: Ramped entrance Home Layout: One level Bathroom Shower/Tub: Multimedia programmer: Standard Bathroom Accessibility: Yes Additional Comments: pt lives with his wife but she just broke her hip for the second time (first time was 2/22). Family lives nearby.  Lives With: Spouse Prior Function Level of Independence: Independent with basic ADLs;Independent with homemaking with ambulation;Independent with gait;Independent with transfers  Able to Take Stairs?: Yes Driving: Yes Vision/Perception  Vision - History Ability to See in Adequate Light: 0 Adequate Vision - Assessment Eye Alignment: Within Functional Limits Alignment/Gaze Preference: Within Defined Limits Tracking/Visual Pursuits: Able to track stimulus in all quads without difficulty Perception Perception: Impaired Inattention/Neglect: Other (comment) (mild L inattention) Praxis Praxis:  Impaired Praxis Impairment Details: Motor planning  Cognition Overall Cognitive Status: Impaired/Different from baseline Arousal/Alertness: Awake/alert Orientation Level: Oriented to person;Oriented to situation;Oriented to place Year: 2022 Month: November Day of Week: Incorrect Attention: Sustained Sustained Attention: Impaired Sustained Attention Impairment: Verbal basic;Functional basic Memory: Impaired Memory Impairment: Decreased short term memory;Decreased recall of new information;Retrieval deficit Decreased Short Term Memory: Verbal basic;Functional basic Immediate Memory Recall: Sock;Blue;Bed Memory Recall Sock: Without Cue Memory Recall Blue: Without  Cue Memory Recall Bed: Not able to recall Awareness: Impaired Awareness Impairment: Other (comment);Intellectual impairment Behaviors: Perseveration Safety/Judgment: Impaired Comments: Decreased knowledge of current deficits and decreased knowledge of need for external assistance. Sensation Sensation Light Touch: Appears Intact Hot/Cold: Appears Intact Coordination Gross Motor Movements are Fluid and Coordinated: No Fine Motor Movements are Fluid and Coordinated: No Coordination and Movement Description: Decreased bimanual coordination. L disdiadochokinesia. Finger Nose Finger Test: Impaired L>R. Perseverates. Repeat cueing required. Motor  Motor Motor: Hemiplegia;Motor perseverations;Motor apraxia;Abnormal postural alignment and control Motor - Skilled Clinical Observations: Mild L weakness and posterior bias   Trunk/Postural Assessment  Cervical Assessment Cervical Assessment: Within Functional Limits Thoracic Assessment Thoracic Assessment: Exceptions to [facility] (Mild kyphosis) Lumbar Assessment Lumbar Assessment: Within Functional Limits Postural Control Postural Control: Deficits on evaluation Righting Reactions: Delayed; insufficient Protective Responses: Delayed; insufficient  Balance Balance Balance  Assessed: Yes Standardized Balance Assessment Standardized Balance Assessment: Berg Balance Test Berg Balance Test Sit to Stand: Needs minimal aid to stand or to stabilize Standing Unsupported: Unable to stand 30 seconds unassisted Sitting with Back Unsupported but Feet Supported on Floor or Stool: Able to sit safely and securely 2 minutes Stand to Sit: Needs assistance to sit Transfers: Needs one person to assist Standing Unsupported with Eyes Closed: Needs help to keep from falling Standing Ubsupported with Feet Together: Needs help to attain position and unable to hold for 15 seconds From Standing, Reach Forward with Outstretched Arm: Loses balance while trying/requires external support From Standing Position, Pick up Object from Floor: Unable to try/needs assist to keep balance From Standing Position, Turn to Look Behind Over each Shoulder: Needs assist to keep from losing balance and falling Turn 360 Degrees: Needs assistance while turning Standing Unsupported, Alternately Place Feet on Step/Stool: Needs assistance to keep from falling or unable to try Standing Unsupported, One Foot in Front: Loses balance while stepping or standing Standing on One Leg: Unable to try or needs assist to prevent fall Total Score: 6 Static Sitting Balance Static Sitting - Balance Support: No upper extremity supported;Feet supported Static Sitting - Level of Assistance: 5: Stand by assistance (Min guard) Dynamic Sitting Balance Dynamic Sitting - Balance Support: No upper extremity supported;Feet supported Dynamic Sitting - Level of Assistance: 4: Min assist Dynamic Sitting - Balance Activities: Forward lean/weight shifting;Lateral lean/weight shifting Static Standing Balance Static Standing - Balance Support: Bilateral upper extremity supported;During functional activity Static Standing - Level of Assistance: 4: Min assist Dynamic Standing Balance Dynamic Standing - Balance Support: Bilateral upper  extremity supported;During functional activity Dynamic Standing - Level of Assistance: 4: Min assist Extremity Assessment  RUE Assessment RUE Assessment: Within Functional Limits LUE Assessment LUE Assessment: Exceptions to [facility] LUE Body System: Neuro Brunstrum levels for arm and hand: Arm Brunstrum level for arm: Stage IV Movement is deviating from synergy RLE Assessment RLE Assessment: Within Functional Limits Active Range of Motion (AROM) Comments: WFL for functional mobility, tight heel cords and hamstrings General Strength Comments: Grossly 4+/5 throughout LLE Assessment LLE Assessment: Exceptions to [name] Active Range of Motion (AROM) Comments: WFL for functional mobility, tight heel cords and hamstrings General Strength Comments: Grossly at least 3+/5 throughout with mobility, limited formal testing due to inattention  Care Tool Care Tool Bed Mobility Roll left and right activity   Roll left and right assist level: Supervision/Verbal cueing    Sit to lying activity   Sit to lying assist level: Supervision/Verbal cueing    Lying to sitting on side of bed activity  Lying to sitting on side of bed assist level: the ability to move from lying on the back to sitting on the side of the bed with no back support.: Supervision/Verbal cueing     Care Tool Transfers Sit to stand transfer   Sit to stand assist level: Minimal Assistance - Patient > 75% Sit to stand assistive device: Walker  Chair/bed transfer   Chair/bed transfer assist level: Minimal Assistance - Patient > 75%     Physiological scientist transfer assist level: Minimal Assistance - Patient > 75% Health visitor Comment: RW    Care Tool Locomotion Ambulation   Assist level: Minimal Assistance - Patient > 75% Assistive device: Walker-rolling Max distance: 40 ft  Walk 10 feet activity   Assist level: Minimal Assistance - Patient > 75% Assistive device: Walker-rolling   Walk  50 feet with 2 turns activity Walk 50 feet with 2 turns activity did not occur: Safety/medical concerns (decreased activity tolerance)      Walk 150 feet activity Walk 150 feet activity did not occur: Safety/medical concerns      Walk 10 feet on uneven surfaces activity   Assist level: Minimal Assistance - Patient > 75% Assistive device: Walker-rolling  Stairs Stair activity did not occur: Safety/medical concerns (hypotension)        Walk up/down 1 step activity Walk up/down 1 step or curb (drop down) activity did not occur: Safety/medical concerns      Walk up/down 4 steps activity Walk up/down 4 steps activity did not occur: Safety/medical concerns      Walk up/down 12 steps activity Walk up/down 12 steps activity did not occur: Safety/medical concerns      Pick up small objects from floor Pick up small object from the floor (from standing position) activity did not occur: Safety/medical concerns      Wheelchair Is the patient using a wheelchair?: Yes Type of Wheelchair: Manual   Wheelchair assist level: Minimal Assistance - Patient > 75% Max wheelchair distance: 55 ft  Wheel 50 feet with 2 turns activity   Assist Level: Minimal Assistance - Patient > 75%  Wheel 150 feet activity   Assist Level: Total Assistance - Patient < 25%    Refer to Care Plan for Long Term Goals  SHORT TERM GOAL WEEK 1 PT Short Term Goal 1 (Week 1): Patient will perform basic transfers with CGA >75% of the time using LRAD. PT Short Term Goal 2 (Week 1): Patient will ambulate >100 feet with CGA using LRAD. PT Short Term Goal 3 (Week 1): Patient will initiate stair training for increased endurance/strength with functional mobility. PT Short Term Goal 4 (Week 1): Patient will improve his Berg Score by >8 points to meet the MCID.  Recommendations for other services: Neuropsych  Skilled Therapeutic Intervention Evaluation completed (see details above and below) with education on PT POC and goals and  individual treatment initiated with focus on functional mobility/transfers, LE strength, dynamic standing balance/coordination, ambulation, stair navigation, simulated car transfers, and improved endurance with activity Patient provided with 18"x18" wheelchair with standard foam cushion and adjustments made to promote optimal seating posture and pressure distribution. Patient also provided with RW for use in room and therapist adjusted to proper height for patient.  Patient sitting EOB eating lunch upon PT arrival. Patient alert and agreeable to PT session. Patient denied pain during session, reports intermittent headache, but not present at this time.   Therapeutic  Activity: Bed Mobility: Patient performed rolling R/L and supine to/from sit with supervision in a flat bed without use of bed rails to simulate home set-up. Transfers: Patient performed sit to/from stand x4 with min A using RW per patient's request, reports he used his wife's RW when she is not using it. Required two attempts to stand x2 trials due to posterior bias. Provided verbal cues for hand placement, forward weight shift, and facilitation for completing turn and safe descent into w/c x2. Patient performed a simulated sedan height car transfer with min A using RW. Provided cues for safe technique.  Gait Training:  Patient ambulated >10 feet x2, up and down a 10 foot ramp to simulate home entry, and 40 feet (12.2 meters) with 2x180 deg turns in 5 min and 45 sec (average gait speed 0.04 m/s) performing 6 Minute Walk Test using RW with min A. Ambulated with very slow gait speed, forward trunk flexion, with small step length with intermittent pauses due to external vs internal distraction or difficulty with initiation, and intermittent scissoring of gait with larger step length. Provided verbal cues for erect posture, attention to task, sequencing, safe proximity to RW with min A for device management, and increased gait  speed. Vitals: Prior to 6 Min Walk Test: BP 133/62, HR 78, SPO2 96%, RPE 1/10 After 6 Min Walk Test: BP 92/53, HR 66, SPO2 96%, RPE 6/10 Patient denied symptoms, however, activity terminated at this time due to orthostatic hypotension and patient returned to the room lying supine in bed. BP 113/87, HR 94, SPO2 95% RN made aware at end of session.  Wheelchair Mobility:  Patient propelled wheelchair 55 feet with min A to reduce L veering. Provided verbal cues for propulsion technique and equal push from B upper extremities.   Neuromuscular Re-ed: Patient performed the Berg Balance Test: Patient demonstrates increased fall risk as noted by score of 6/56 on Berg Balance Scale.  (<36= high risk for falls, close to 100%; 37-45 significant >80%; 46-51 moderate >50%; 52-55 lower >25%)  Instructed pt in results of PT evaluation as detailed above, PT POC, rehab potential, rehab goals, and discharge recommendations. Additionally discussed CIR's policies regarding fall safety and use of chair alarm and/or quick release belt. Pt verbalized understanding and in agreement. Will update pt's family members as they become available.   Patient semi-reclined in bed with eyes closed at end of session with breaks locked, bed alarm set, and all needs within reach.    Discharge Criteria: Patient will be discharged from PT if patient refuses treatment 3 consecutive times without medical reason, if treatment goals not met, if there is a change in medical status, if patient makes no progress towards goals or if patient is discharged from hospital.  The above assessment, treatment plan, treatment alternatives and goals were discussed and mutually agreed upon: No family available/patient unable  Kenslee Achorn L Hortense Cantrall PT, DPT  02/27/2021, 4:12 PM

## 2021-02-27 NOTE — Progress Notes (Signed)
Dr Dagoberto Ligas called regarding Aldactone Order that she had prescribed to be given to patient. Keep SBP <190 mmHg. Read back and documented. Courtney (LPN) informed. Will keep monitoring patient for any possible neurological deficit while ensuring patient gets rest.

## 2021-02-27 NOTE — Significant Event (Signed)
Rapid Response Event Note   Reason for Call :  HTN   Initial Focused Assessment:  Pt lying in bed with eyes closed, in no visible distress.  He is lethargic and confused. He is alert to person and time only. He c/o a headache behind his R eye. He moves all extremities and follows commands. Pupils 4, equal, and brisk. Skin warm and dry.   HR-63, BP-188/95, RR-18, SpO2-97% on RA.    Interventions:  No RRT interventions   Plan of Care:  Pt is on Coreg 3.125mg  BID at home-he is on that here. He is also on Aldactone 12.5mg  daily-he is not getting this here. It may benefit pt to start back his aldactone. Also, a TRH consult to assist with HTN mgmt may be helpful as well. Pt has no focal deficits but is c/o a R sided headache. ? Need for repeat CT head scan to evaluate bleed.    Event Summary:   MD Notified: Bedside RN to notified MD of happenings  Call Time:0026 Arrival Time:0032 End Time0100  Dillard Essex, RN

## 2021-02-27 NOTE — Progress Notes (Signed)
Pt still has not had a bm only 30 ml of 65mlk ordered given last night. Called Dr. Naaman Plummer he ordered another 60 ml of sorbitol. 60 ml of sorbitol administered awaiting results. No other concerns to report.

## 2021-02-28 ENCOUNTER — Other Ambulatory Visit: Payer: Self-pay

## 2021-02-28 ENCOUNTER — Encounter (HOSPITAL_COMMUNITY): Payer: Self-pay | Admitting: Physical Medicine & Rehabilitation

## 2021-02-28 DIAGNOSIS — E440 Moderate protein-calorie malnutrition: Secondary | ICD-10-CM | POA: Diagnosis not present

## 2021-02-28 DIAGNOSIS — I5042 Chronic combined systolic (congestive) and diastolic (congestive) heart failure: Secondary | ICD-10-CM | POA: Diagnosis not present

## 2021-02-28 DIAGNOSIS — I10 Essential (primary) hypertension: Secondary | ICD-10-CM | POA: Diagnosis not present

## 2021-02-28 DIAGNOSIS — I619 Nontraumatic intracerebral hemorrhage, unspecified: Secondary | ICD-10-CM | POA: Diagnosis not present

## 2021-02-28 DIAGNOSIS — K5901 Slow transit constipation: Secondary | ICD-10-CM | POA: Diagnosis not present

## 2021-02-28 LAB — CBC WITH DIFFERENTIAL/PLATELET
Abs Immature Granulocytes: 0.02 10*3/uL (ref 0.00–0.07)
Basophils Absolute: 0 10*3/uL (ref 0.0–0.1)
Basophils Relative: 0 %
Eosinophils Absolute: 0.2 10*3/uL (ref 0.0–0.5)
Eosinophils Relative: 3 %
HCT: 40.9 % (ref 39.0–52.0)
Hemoglobin: 13.5 g/dL (ref 13.0–17.0)
Immature Granulocytes: 0 %
Lymphocytes Relative: 15 %
Lymphs Abs: 1 10*3/uL (ref 0.7–4.0)
MCH: 31.3 pg (ref 26.0–34.0)
MCHC: 33 g/dL (ref 30.0–36.0)
MCV: 94.7 fL (ref 80.0–100.0)
Monocytes Absolute: 0.7 10*3/uL (ref 0.1–1.0)
Monocytes Relative: 10 %
Neutro Abs: 4.9 10*3/uL (ref 1.7–7.7)
Neutrophils Relative %: 72 %
Platelets: 159 10*3/uL (ref 150–400)
RBC: 4.32 MIL/uL (ref 4.22–5.81)
RDW: 12.7 % (ref 11.5–15.5)
WBC: 6.8 10*3/uL (ref 4.0–10.5)
nRBC: 0 % (ref 0.0–0.2)

## 2021-02-28 LAB — BASIC METABOLIC PANEL
Anion gap: 7 (ref 5–15)
BUN: 11 mg/dL (ref 8–23)
CO2: 28 mmol/L (ref 22–32)
Calcium: 8.6 mg/dL — ABNORMAL LOW (ref 8.9–10.3)
Chloride: 99 mmol/L (ref 98–111)
Creatinine, Ser: 0.9 mg/dL (ref 0.61–1.24)
GFR, Estimated: >60 mL/min (ref >60)
Glucose, Bld: 93 mg/dL (ref 70–99)
Potassium: 3.7 mmol/L (ref 3.5–5.1)
Sodium: 134 mmol/L — ABNORMAL LOW (ref 135–145)

## 2021-02-28 NOTE — Progress Notes (Signed)
Inpatient Rehabilitation  Patient information reviewed and entered into eRehab system by Shishir Krantz Emelia Sandoval, OTR/L.   Information including medical coding, functional ability and quality indicators will be reviewed and updated through discharge.    

## 2021-02-28 NOTE — Progress Notes (Signed)
Occupational Therapy Session Note  Patient Details  Name: [name] MRN: [id number] Date of Birth: 1946/04/14  Today's Date: 02/28/2021 OT Individual Time: 5916-3846 OT Individual Time Calculation (min): 74 min    Short Term Goals: Week 1:  OT Short Term Goal 1 (Week 1): Patient will complete toilet transfer with Min guard. OT Short Term Goal 2 (Week 1): Patient will complete shower transfer with Min guard. OT Short Term Goal 3 (Week 1): Patient will don LB clothing with Min gaurd.  Skilled Therapeutic Interventions/Progress Updates:  Pt greeted asleep in supine needing needing max multimodal cues to arouse. Pts son did call during beginning of sesssion which helped pt to arouse pt more. pt  agreeable to OT intervention. Pt on 1 L [place] at start of session however SpO2 >98% on RA throughout session. Session focus on  BADL reeducation, BLE therex and functional mobility to faciliate improvments with overall activity tolerance. BP checked from supine 149/86. Pt completed bed mobility with CGA with MIN verbal cues for sequencing. Sit<>stand from EOB with rw with MIN A d/t initial posteriror lean, CGA for stand pivot transfer to w/c with RW. Pt transported to sink for grooming with pt completing oral care in sit/stand, supervision for oral care, CGA to rise from w/c to spit into sink using sink for balance. Pt declined wash up or changing clothes at this time. Pt completed ~ 30 ft of functional mobility with rw and CGA to facilitate improved activity tolerance and endurance for ADL participation. RT enter to give breathing treatment therefore pt completed below BLE therex with 5lb ankle weights to increase strength in BLE to improve independence with ADL transfers.  2x10 LAQs 2x10 seated marches 2x10 ankle pumps  Standing therex post breathing txt no ankle weights: Standing BLE marches 2x10 X10 hip ABD/ADD   Pt completed ambulatory toilet transfer with rw and CGA, MIN A to lower onto low toilet  seat, pt completed 3/3 toileting tasks with MIN A needing assist for clothing mgmt d/t fatigue, +bladder void, MIN A to rise from low toilet seat. Pt completed ambulatory transfer back to bed with rw and CGA. CGA for sit>supine with use of bed features.  pt left supine in bed with bed alarm activated and all needs within reach.                   Therapy Documentation Precautions:  Precautions Precautions: Fall Precaution Comments: Labile BP, monitor with mobility Restrictions Weight Bearing Restrictions: No  Pain: pt reports pain in BLEs, LPN provided pain meds during session and rest breaks utilized as needed.     Therapy/Group: Individual Therapy  Corinne Ports [facility] 02/28/2021, 9:16 AM

## 2021-02-28 NOTE — Progress Notes (Signed)
Bowel program performed per md orders. Patient had bm this shift 02/28/21.

## 2021-02-28 NOTE — Progress Notes (Signed)
Occupational Therapy Session Note  Patient Details  Name: [name] MRN: [id number] Date of Birth: Mar 18, 1946  Today's Date: 02/28/2021 OT Individual Time: 1406-1500 OT Individual Time Calculation (min): 54 min    Short Term Goals: Week 1:  OT Short Term Goal 1 (Week 1): Patient will complete toilet transfer with Min guard. OT Short Term Goal 2 (Week 1): Patient will complete shower transfer with Min guard. OT Short Term Goal 3 (Week 1): Patient will don LB clothing with Min gaurd.  Skilled Therapeutic Interventions/Progress Updates:    Pt seen for OT treatment with pt laying in the bed asleep to start.  He was verbally engaged in conversation with therapist, but kept his eyes closed, until he transferred to the EOB with min assist.  He was agreeable to showering and completed sit to stand from the bed at mod assist.  He was able to use the RW for functional mobility into the bathroom with mod demonstrational cueing and min assist for upright posture.  He tends to exhibit flexed trunk and head with head tilt to the right.  He was able to complete toilet transfer with mod assist as well and then complete removal of all clothing at min assist level.  He transferred over to the tub bench at [facility] assist and no device for bathing sit to stand.  He was able to complete all bathing at min assist sit to stand with use of the grab bar for support.  Next, he was able to dressing sit to stand from the edge of the bench with min assist.  Finished session with ambulation back to the bed with use of the RW and min assist.  Pt returned to supine at min guard assist where he was left with the call button and phone in reach as well as safety alarm in place and telemonitor in the room.  Pt was oriented to place, month, and reason for being in the hospital.  He was not oriented to the day of the week or the day of the month.    Therapy Documentation Precautions:  Precautions Precautions: Fall Precaution  Comments: Labile BP, monitor with mobility Restrictions Weight Bearing Restrictions: No   Pain: Pain Assessment Pain Scale: Faces Pain Score: 0-No pain ADL: See Care Tool Section for some details of mobility and selfcare   Therapy/Group: Individual Therapy  Lorren Rossetti OTR/L 02/28/2021, 3:44 PM

## 2021-02-28 NOTE — Progress Notes (Signed)
Physical Therapy Session Note  Patient Details  Name: [name] MRN: [id number] Date of Birth: [birth year]  Today's Date: 02/28/2021 PT Individual Time:  1015-1110 PT Individual Time Calculation (min): 55 min   Short Term Goals: Week 1:  PT Short Term Goal 1 (Week 1): Patient will perform basic transfers with CGA >75% of the time using LRAD. PT Short Term Goal 2 (Week 1): Patient will ambulate >100 feet with CGA using LRAD. PT Short Term Goal 3 (Week 1): Patient will initiate stair training for increased endurance/strength with functional mobility. PT Short Term Goal 4 (Week 1): Patient will improve his Berg Score by >8 points to meet the MCID.  Skilled Therapeutic Interventions/Progress Updates:  Patient supine in bed asleep on entrance to room. Numerous attempts to wake pt including sternal rub with pt partially rousing and responding to questions but then returning to sleep quickly. RN notified and enters room to assist as nursing also now needs pt's current weight. RN relates that he has been lethergic especially after receiving medication.Covers removed, shoes removed, extra weighted items removed from bed frame and weight recorded. Pt's BP taken at 174/ 75 and 95% on RA. RN places pt on 2L O2 per orders from MD and SpO2 rises to 98-100%. Pt requires MinA to initiate any movement toward EOB, but once seated upright, pt more alert and agreeable to PT session.   Patient with no pain complaint throughout session.  Therapeutic Activity: Bed Mobility: Patient performed supine --> sit with MinA for initiation and CGA to complete. Return to sidelying at end of session with supervision. VC/ tc required for effort, encouragement, min technique. Transfers: Patient performed sit<>stand transfers throughout session with MinA initially requiring several attempts for first two transfers and improves to CGA with ability to perform on first attempt. Provided verbal cues for scoot to edge of seat,  forward lean, BUE push from seat, hand progression/ placement.  Neuromuscular Re-ed: NMR facilitated during session with focus on standing balance. Pt guided in sit<>stand technique and 5xSTS performance. Instructions and cueing as stated above in order to improve technique. 5xSTS performed reqiring cues for continued performance throughout as pt pauses after each rise to stand and descent to sit. 5xSTS completed in 1 min 10sec (A score of 15 seconds or greater indicates patient is at an increased risk for falls. Education provided to patient on interpretation of balance score)   NMR performed for improvements in motor control and coordination, balance, sequencing, judgement, and self confidence/ efficacy in performing all aspects of mobility at highest level of independence.   Patient sidelying to R side  in bed at end of session with brakes locked, bed alarm set, and all needs within reach.     Therapy Documentation Precautions:  Precautions Precautions: Fall Precaution Comments: Labile BP, monitor with mobility Restrictions Weight Bearing Restrictions: No General:    Pain: Pain Assessment Pain Score: 0-No pain  Therapy/Group: Individual Therapy  Alger Simons PT, DPT 02/28/2021, 10:32 AM

## 2021-02-28 NOTE — Care Management (Signed)
[facility] Individual Statement of Services  Patient Name:  [name]  Date:  02/28/2021  Welcome to the [facility].  Our goal is to provide you with an individualized program based on your diagnosis and situation, designed to meet your specific needs.  With this comprehensive rehabilitation program, you will be expected to participate in at least 3 hours of rehabilitation therapies Monday-Friday, with modified therapy programming on the weekends.  Your rehabilitation program will include the following services:  Physical Therapy (PT), Occupational Therapy (OT), 24 hour per day rehabilitation nursing, Therapeutic Recreaction (TR), Psychology, Neuropsychology, Care Coordinator, Rehabilitation Medicine, [place], and Other  Weekly team conferences will be held on Tuesdays to discuss your progress.  Your Inpatient Rehabilitation Care Coordinator will talk with you frequently to get your input and to update you on team discussions.  Team conferences with you and your family in attendance may also be held.  Expected length of stay: 2-2.5 weeks  Overall anticipated outcome: Supervision  Depending on your progress and recovery, your program may change. Your Inpatient Rehabilitation Care Coordinator will coordinate services and will keep you informed of any changes. Your Inpatient Rehabilitation Care Coordinator's name and contact numbers are listed  below.  The following services may also be recommended but are not provided by the [facility] will be made to provide these services after discharge if needed.  Arrangements include referral to agencies that provide these services.  Your insurance has been verified to be:  [employer]  Your primary doctor is:  Emily Filbert  Pertinent information will be shared with your doctor and your insurance company.  Inpatient Rehabilitation Care Coordinator:  Cathleen Corti [phone] or ([phone]  Information discussed with and copy given to patient by: Rana Snare, 02/28/2021, 1:13 PM

## 2021-02-28 NOTE — Progress Notes (Addendum)
Patient ID: [name], male   DOB: 1945-09-12, 75 y.o.   MRN: [id number]  SW made efforts to meet with pt to complete assessment but pt not in room as in therapy. SW left statement of service form in room on bed. SW will follow-up to complete assessment.   SW spoke with pt wife [name] [phone]) to introduce self, explain role, and discuss discharge process. Confirms that she will be primary caregiver, and their children (blended family) are working towards providing assistance in the evening. She is aware SW will follow-up tomorrow after team conference with updates.   Loralee Pacas, MSW, [place] Office: [phone] Cell: [phone] Fax: [phone]

## 2021-02-28 NOTE — Progress Notes (Signed)
PROGRESS NOTE   Subjective/Complaints: Had a much better night. Already up with therapy. Seems to be in decent spirits. Bp under better control. Pt reports headache but says that it's improved.  ROS: Limited due to cognitive/behavioral    Objective:   No results found. Recent Labs    02/28/21 0801  WBC 6.8  HGB 13.5  HCT 40.9  PLT 159   Recent Labs    02/28/21 0801  NA 134*  K 3.7  CL 99  CO2 28  GLUCOSE 93  BUN 11  CREATININE 0.90  CALCIUM 8.6*    Intake/Output Summary (Last 24 hours) at 02/28/2021 1029 Last data filed at 02/28/2021 0900 Gross per 24 hour  Intake 220 ml  Output 100 ml  Net 120 ml     Pressure Injury 02/26/21 Coccyx Mid;Posterior Stage 2 -  Partial thickness loss of dermis presenting as a shallow open injury with a red, pink wound bed without slough. (Active)  02/26/21 1702  Location: Coccyx  Location Orientation: Mid;Posterior  Staging: Stage 2 -  Partial thickness loss of dermis presenting as a shallow open injury with a red, pink wound bed without slough.  Wound Description (Comments):   Present on Admission: Yes    Physical Exam: Vital Signs Blood pressure (!) 158/80, pulse (!) 57, temperature 98.9 F (37.2 C), resp. rate 18, SpO2 96 %.  Constitutional: No distress . Vital signs reviewed. HEENT: NCAT, EOMI, oral membranes moist Neck: supple Cardiovascular: RRR without murmur. No JVD    Respiratory/Chest: CTA Bilaterally without wheezes or rales. Normal effort    GI/Abdomen: BS +, non-tender, non-distended Ext: no clubbing, cyanosis, or edema Psych: flat but does interact, sometimes will kid around Skin: scattered bruises on limbs Neuro:  oriented to person, ?hospital. Follows basic commands. Distracted. Moves all 4's. Seems to sense  pain. Musculoskeletal: no pain with ROM/palpation    Assessment/Plan: 1. Functional deficits which require 3+ hours per day of  interdisciplinary therapy in a comprehensive inpatient rehab setting. Physiatrist is providing close team supervision and 24 hour management of active medical problems listed below. Physiatrist and rehab team continue to assess barriers to discharge/monitor patient progress toward functional and medical goals  Care Tool:  Bathing    Body parts bathed by patient: Right arm, Left arm, Chest, Abdomen, Front perineal area, Right upper leg, Left upper leg, Right lower leg, Left lower leg, Face   Body parts bathed by helper: Buttocks     Bathing assist Assist Level: Minimal Assistance - Patient > 75%     Upper Body Dressing/Undressing Upper body dressing   What is the patient wearing?: Pull over shirt    Upper body assist Assist Level: Supervision/Verbal cueing    Lower Body Dressing/Undressing Lower body dressing      What is the patient wearing?: Pants     Lower body assist Assist for lower body dressing: Moderate Assistance - Patient 50 - 74%     Toileting Toileting Toileting Activity did not occur (Clothing management and hygiene only): N/A (no void or bm)  Toileting assist Assist for toileting: Minimal Assistance - Patient > 75%     Transfers Chair/bed transfer  Transfers assist  Chair/bed transfer assist level: Contact Guard/Touching assist Chair/bed transfer assistive device: Museum/gallery exhibitions officer assist      Assist level: Contact Guard/Touching assist Assistive device: Walker-rolling Max distance: 30 ft   Walk 10 feet activity   Assist     Assist level: Minimal Assistance - Patient > 75% Assistive device: Walker-rolling   Walk 50 feet activity   Assist Walk 50 feet with 2 turns activity did not occur: Safety/medical concerns (decreased activity tolerance)         Walk 150 feet activity   Assist Walk 150 feet activity did not occur: Safety/medical concerns         Walk 10 feet on uneven surface   activity   Assist     Assist level: Minimal Assistance - Patient > 75% Assistive device: Walker-rolling   Wheelchair     Assist Is the patient using a wheelchair?: Yes Type of Wheelchair: Manual    Wheelchair assist level: Minimal Assistance - Patient > 75% Max wheelchair distance: 55 ft    Wheelchair 50 feet with 2 turns activity    Assist        Assist Level: Minimal Assistance - Patient > 75%   Wheelchair 150 feet activity     Assist      Assist Level: Total Assistance - Patient < 25%   Blood pressure (!) 158/80, pulse (!) 57, temperature 98.9 F (37.2 C), resp. rate 18, SpO2 96 %.  Medical Problem List and Plan:   SAH/IPH due to hypertensive emergency- with impaired ADLs/mobility  -Continue CIR therapies including PT, OT, and SLP  Antithrombotics . DVT Prophylaxis/Anticoagulation: Mechanical: Sequential compression devices, below knee Bilateral lower extremities due to brain bleed 2. Pain Management: Norco for HA as needed and tylenol prn- will monitor for needing daily prevention meds for HA 3. Mood/sleep: emotional support- and monitor if needs  neuropsychology.   -check sleep chart  -schedule trazodone at night 4. Neuropsych- the patient is not  capable of making decisions for himself.  5. Skin/Wounds- routine wound care.  6. Fluids/Electrolytes/Nutrition: eating next to nothing  -encourage PO  -cognitive component 7. HTN in setting of hypertensive emergency-   -scheduled coreg, entresto,  and aldactone  -prn catapres and hydralazine  -continue low dose scheduled hydralazine 10mg  tid  11/14 bp's better control 8. CHF with EF 30-35%- con't Entresto and monitor  There were no vitals filed for this visit. Need weights  -request daily weights again 11/14 9. COPD- con't O2 and wean as tolerated-also continue his regimen of COPD meds- including Pulmicort, Albuterol prn, Brovana. And change as required.  10. Constipation- will order Sorbitol  since it's been 1 week since last BM- and con't miralax prn and Colace.   11/14 had two bm's yesterday---continue with current meds 11. UTI- resolved- s/p IV rocephin 12. Smoker- tobacco- abuse- counseling and Nicoderm 21 mg daily 13. Daily headaches- con't Norco for now-    -resolving 14. Leg spasms- is chronic- cannot tell is restless leg syndrome or myoclonus- will monitor- might need to address since was very active.  15. Hx of prostate CA s/p XRT seeding 16. GERD- con't protonix and monitor/change as required 17. AAA/CAD/HLD- con't to monitor    LOS: 2 days A FACE TO FACE EVALUATION WAS PERFORMED  Meredith Staggers 02/28/2021, 10:29 AM

## 2021-03-01 DIAGNOSIS — I5042 Chronic combined systolic (congestive) and diastolic (congestive) heart failure: Secondary | ICD-10-CM | POA: Diagnosis not present

## 2021-03-01 DIAGNOSIS — K5901 Slow transit constipation: Secondary | ICD-10-CM | POA: Diagnosis not present

## 2021-03-01 DIAGNOSIS — E440 Moderate protein-calorie malnutrition: Secondary | ICD-10-CM | POA: Diagnosis not present

## 2021-03-01 DIAGNOSIS — I10 Essential (primary) hypertension: Secondary | ICD-10-CM | POA: Diagnosis not present

## 2021-03-01 DIAGNOSIS — I619 Nontraumatic intracerebral hemorrhage, unspecified: Secondary | ICD-10-CM | POA: Diagnosis not present

## 2021-03-01 DIAGNOSIS — L8990 Pressure ulcer of unspecified site, unspecified stage: Secondary | ICD-10-CM | POA: Insufficient documentation

## 2021-03-01 NOTE — Progress Notes (Signed)
Physical Therapy Session Note  Patient Details  Name: [name] MRN: [id number] Date of Birth: [birth year]  Today's Date: 03/01/2021 PT Individual Time: [phone] PT skilled Minutes: 70 minutes  Short Term Goals: Week 1:  PT Short Term Goal 1 (Week 1): Patient will perform basic transfers with CGA >75% of the time using LRAD. PT Short Term Goal 2 (Week 1): Patient will ambulate >100 feet with CGA using LRAD. PT Short Term Goal 3 (Week 1): Patient will initiate stair training for increased endurance/strength with functional mobility. PT Short Term Goal 4 (Week 1): Patient will improve his Berg Score by >8 points to meet the MCID.  Skilled Therapeutic Interventions/Progress Updates:     Pt received supine in bed asleep. Easily roused and agreeable to therapy. Reports pain above L eye and in both knees, chronic in nature. No number provided. Pt provides rest breaks as needed and mobility to manage pain. Supine to sit with minA. PT assists with donning shoes while pt is sitting at EOB and pt has LOB backward due to posterior bias. ModA to maintain upright sitting balance and cues for anterior weight transition. Pt performs sit to stand transfer to RW with minA and ambulates x15' to toilet with minA and cues for upright gaze to improve posture and balance, increasing proximity to RW for safety, and RW management while turning to sit on toilet. Pt continent of urine. Ambulatory transfer back to [name] with pt washing hands in standing prior to sitting back down. Tactile cues for posture due to kyphotic tendency. WC transport to gym for time management. Pt ambulates x100' with RW and minA, with very slow gait pattern and downward gaze with kyphotic posture. PT provides verbal and tactile cues to maintain upright gaze to improve posture and balance, increasing bilateral stride length and increasing proximity to RW for safety.   Pt performs balance and strengthening in parallel bars. Mirror provided for  visual feedback. Initially pt cued to achieve optimal posture with PT providing cues at trunk, head, and hips. Pt then performs side stepping 5' L and 5' R x2 with minA. Pt moves very slowly. Following seated rest break, pt performs 2x10 heel raises and toe raises to fatigue, with PT facilitating neutral posture and optimal muscle activation. Pt performs 1x30 marches in place with cue to touch thigh to bar to work on single leg stance balance and contralateral hip flexor strength.  WC transport back to room. Stand pivot back to bed with minA and cues for positioning. Sit to supine with bed features and cues for sequencing. Left with alarm intact and all needs within reach.  Therapy Documentation Precautions:  Precautions Precautions: Fall Precaution Comments: Labile BP, monitor with mobility Restrictions Weight Bearing Restrictions: No    Therapy/Group: Individual Therapy  Breck Coons, PT, DPT 03/01/2021, 12:08 PM

## 2021-03-01 NOTE — Patient Care Conference (Signed)
Inpatient RehabilitationTeam Conference and Plan of Care Update Date: 03/01/2021   Time: 10:45 AM    Patient Name: [name]      Medical Record Number: [id number]  Date of Birth: 06/17/1945 Sex: Male         Room/Bed: 5C15C/5C15C-01 Payor Info: Payor: [employer] / Plan: [employer] PPO / Product Type: *No Product type* /    Admit Date/Time:  02/26/2021  4:12 PM  Primary Diagnosis:  Intraparenchymal hemorrhage of brain [facility])  Hospital Problems: Principal Problem:   Intraparenchymal hemorrhage of brain [facility]) Active Problems:   Pressure injury of skin    Expected Discharge Date: Expected Discharge Date: 03/16/21  Team Members Present: Physician leading conference: Dr. Alger Simons Social Worker Present: Loralee Pacas, [place] Nurse Present: Dorthula Nettles, RN PT Present: Tereasa Coop, PT OT Present: Cherylynn Ridges, OT SLP Present: Weston Anna, SLP PPS Coordinator present : Gunnar Fusi, SLP     Current Status/Progress Goal Weekly Team Focus  Bowel/Bladder   Continent of B/B. LBM 02/28/21  Remain continent.      Swallow/Nutrition/ Hydration             ADL's   Supervision for UB selfcare with min assist for LB selfcare.  Min to mod for transfers with use of the RW for support. Min to mod for toileting.  Decreased selective attention.  supervision overall  selfcare retraining, transfer training, balance retraining, DME education, therapeutic activites.   Mobility   minA bed mobility, sit to stand transfer, gait x100' with RW  Supervision  overall strengthening, transfers, ambulation, balance   Communication             Safety/Cognition/ Behavioral Observations            Pain   Complains of headaches. Prn meds per patient request.  <3/10. Assess Q shift and prn.      Skin   Pressure injury to sacrum Stage1  Dressing changes as ordered.  Prevent further breakdown.     Discharge Planning:  Pt to d/c to home with his wife. Wife reports  she is working on addtl support from her sons. She is unsure if they will assist, but feels "they can make it."   Team Discussion: BP better controlled, sleeping better. Can discontinue IV. Continent B/B, reports 6/10 headache. Stage 2 with foam dressing to the coccyx. May possibly be just the 2 of them at discharge. Supervision/min assist. Min/mod assist transfers. 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 Constant fatigue and headache over right eye. Patient on target to meet rehab goals: yes  *See Care Plan and progress notes for long and short-term goals.   Revisions to Treatment Plan:  None  Teaching Needs: Family education, medication/pain management, skin/wound care, safety awareness, transfer training, etc.  Current Barriers to Discharge: Decreased caregiver support, Home enviroment access/layout, Wound care, Lack of/limited family support, and Medication compliance  Possible Resolutions to Barriers: Family education Order DME  Set-up HH/Outpatient if appropriate.     Medical Summary Current Status: ICH d/t hypertensive emergency. bp better controlled. sleeping now. headaches improving  Barriers to Discharge: Medical stability   Possible Resolutions to [employer] Focus: daily assessment of pt labs and data, sleep restoration   Continued Need for Acute Rehabilitation Level of Care: The patient requires daily medical management by a physician with specialized training in physical medicine and rehabilitation for the following reasons: Direction of a multidisciplinary physical rehabilitation program  to maximize functional independence : Yes Medical management of patient stability for increased activity during participation in an intensive rehabilitation regime.: Yes Analysis of laboratory values and/or radiology reports with any subsequent need for medication adjustment and/or medical intervention. : Yes   I  attest that I was present, lead the team conference, and concur with the assessment and plan of the team.   Cristi Loron 03/01/2021, 2:24 PM

## 2021-03-01 NOTE — Progress Notes (Signed)
Patient ID: [name], male   DOB: Jan 13, 1946, 75 y.o.   MRN: [id number]  [id number]- SW met with pt in room to provide updates from team conference and called pt wife [name] [phone]) but no answer. SW left message for pt wife. SW provided updates from team conference, and d/c date 11/30.  Loralee Pacas, MSW, [place] Office: [phone] Cell: [phone] Fax: [phone]

## 2021-03-01 NOTE — IPOC Note (Signed)
Overall Plan of Care [facility]) Patient Details Name: [name] MRN: [id number] DOB: March 04, 1946  Admitting Diagnosis: Intraparenchymal hemorrhage of brain [facility])  Hospital Problems: Principal Problem:   Intraparenchymal hemorrhage of brain ([place]) Active Problems:   Pressure injury of skin     Functional Problem List: Nursing Bowel, Bladder, Medication Management, Pain, Safety, Endurance  PT Balance, Nutrition, Skin Integrity, Pain, Behavior, Edema, Perception, Safety, Endurance, Motor, Sensory  OT Balance, Cognition, Endurance, Motor, Perception, Safety  SLP Cognition  TR         Basic ADL's: OT Grooming, Bathing, Dressing, Toileting     Advanced  ADL's: OT       Transfers: PT Bed Mobility, Bed to Chair, Car, Manufacturing systems engineer, Metallurgist: PT Ambulation, Emergency planning/management officer, Stairs     Additional Impairments: OT None  SLP Social Cognition   Problem Solving, Memory, Attention, Awareness  TR      Anticipated Outcomes Item Anticipated Outcome  Self Feeding N/A  Swallowing      Basic self-care  supervision  Toileting  supervision   Bathroom Transfers supervision  Bowel/Bladder  manage bowel with mod I and bladder without assistance  Transfers  Supervision using LRAD  Locomotion  Supervision using LRAD >150 ft  Communication     Cognition  Supervision  Pain  pain at or below level 4  Safety/Judgment  Maintain safety w cues   Therapy Plan: PT Intensity: Minimum of 1-2 x/day ,45 to 90 minutes PT Frequency: 5 out of 7 days PT Duration Estimated Length of Stay: 2-2.5 weeks OT Intensity: Minimum of 1-2 x/day, 45 to 90 minutes OT Frequency: 5 out of 7 days OT Duration/Estimated Length of Stay: 2 weeks SLP Intensity: Minumum of 1-2 x/day, 30 to 90 minutes SLP Frequency: 3 to 5 out of 7 days SLP Duration/Estimated Length of Stay: 2-2.5 weeks   Due to the current state of emergency, patients may not be receiving their 3-hours of  Medicare-mandated therapy.   Team Interventions: Nursing Interventions Patient/Family Education, Bladder Management, Bowel Management, Pain Management, Disease Management/Prevention, Medication Management, Discharge Planning  PT interventions Ambulation/gait training, Cognitive remediation/compensation, Discharge planning, DME/adaptive equipment instruction, Functional mobility training, Pain management, Psychosocial support, Splinting/orthotics, Therapeutic Activities, UE/LE Strength taining/ROM, Visual/perceptual remediation/compensation, UE/LE Coordination activities, Therapeutic Exercise, Wheelchair propulsion/positioning, Stair training, Skin care/wound management, Patient/family education, Neuromuscular re-education, Functional electrical stimulation, Disease management/prevention, Academic librarian, Training and development officer  OT Interventions Training and development officer, Cognitive remediation/compensation, Academic librarian, Discharge planning, Disease mangement/prevention, Engineer, drilling, Functional mobility training, Neuromuscular re-education, Patient/family education, Self Care/advanced ADL retraining, Therapeutic Activities, Therapeutic Exercise, UE/LE Strength taining/ROM, UE/LE Coordination activities, Visual/perceptual remediation/compensation, Wheelchair propulsion/positioning  SLP Interventions Cognitive remediation/compensation, Internal/external aids, Therapeutic Activities, Environmental controls, Cueing hierarchy, Functional tasks, Patient/family education  TR Interventions    SW/CM Interventions Discharge Planning, Psychosocial Support, Patient/Family Education   Barriers to Discharge MD  Medical stability  Nursing New oxygen 2 level, ramped entry, main B+B w wife (hospitalized [facility] w hip fx) sons to assist as needed  PT Decreased caregiver support, Home environment access/layout, Lack of/limited family support, Behavior    OT      SLP       SW       Team Discharge Planning: Destination: PT-Home ,OT- Home , SLP-Home Projected Follow-up: PT-Home health PT, OT-  Outpatient OT, SLP-24 hour supervision/assistance, Outpatient SLP Projected Equipment Needs: PT-To be determined, OT- To be determined, SLP-None recommended by SLP Equipment Details: PT- , OT-  Patient/family involved in discharge  planning: PT- Patient unable/family or caregiver not available,  OT-Patient, SLP-Patient  MD ELOS: 2-2.5 weeks Medical Rehab Prognosis:  Excellent Assessment: The patient has been admitted for CIR therapies with the diagnosis of ICH d/t hypertension. The team will be addressing functional mobility, strength, stamina, balance, safety, adaptive techniques and equipment, self-care, bowel and bladder mgt, patient and caregiver education, NMR, cognition, communication, community reentry. Goals have been set at supervision for mobility and self-care tasks.   Due to the current state of emergency, patients may not be receiving their 3 hours per day of Medicare-mandated therapy.    Meredith Staggers, MD, FAAPMR     See Team Conference Notes for weekly updates to the plan of care

## 2021-03-01 NOTE — Evaluation (Signed)
Speech Language Pathology Assessment and Plan  Patient Details  Name: [name] MRN: [id number] Date of Birth: 06/13/1945  SLP Diagnosis: Cognitive Impairments  Rehab Potential: Excellent ELOS: 2-2.5 weeks    Today's Date: 03/01/2021 SLP Individual Time: 1287-8676 SLP Individual Time Calculation (min): 51 min   Hospital Problem: Principal Problem:   Intraparenchymal hemorrhage of brain ([place]) Active Problems:   Pressure injury of skin  Past Medical History:  Past Medical History:  Diagnosis Date   AAA (abdominal aortic aneurysm)    a.  CTA 7/19: measured 4.5 x 5.3 cm and greatest transverse dimensions   Abnormal nuclear stress test    a.  Myoview 2012: anterior wall ischemia with an estimated EF of 26%.  This was a new wall motion abnormality as well as a newly reduced EF   COPD (chronic obstructive pulmonary disease) (HCC)    Coronary artery disease    a.  Posterior MI in 2002 status post BMS to the LCx; b. [place] 2012: 95% stenosis of the proximal LAD, 95% stenosis of the mid LAD, 30% in-stent restenosis of the mid left circumflex with a second lesion of diffuse 50% stenosis.  The patient underwent successful PCI/BMS to the mid LAD with 0% residual stenosis, LV gram not performed   GERD (gastroesophageal reflux disease)    History of kidney stones    History of prostate cancer    Hyperlipidemia    Hypertension    Myocardial infarction [facility])    Neuromuscular disorder ([place])    Prostate cancer ([place])    a.  Status post seeding   PVD (peripheral vascular disease) ([place])    Stroke [facility])    Past Surgical History:  Past Surgical History:  Procedure Laterality Date   BACK SURGERY  2009/2010   x 2   CARDIAC CATHETERIZATION  2002   stent placement [place]   ENDOVASCULAR REPAIR/STENT GRAFT N/A 01/02/2018   Procedure: ENDOVASCULAR REPAIR/STENT GRAFT;  Surgeon: Algernon Huxley, MD;  Location: [place] [facility];  Service: Cardiovascular;  Laterality: N/A;   FOOT SURGERY      bilateral    HERNIA REPAIR     bilateral    KNEE SURGERY     right knee    PROSTATE SURGERY  09/2009   prostate implant   SPINE SURGERY      Assessment / Plan / Recommendation Clinical Impression Patient is a 75 year old male with medical history of CHF with EF 30 to 35%, HTN, HLD, CVA, GERD, COPD, PVD, prostate cancer s/p XRT seeding, CAD, AAA, tobacco abuse, Barrett's esophagus who presented on 02/21/21 with syncope and a fall. Patient was in the ED at [facility] where his wife was a patient. Staff suddenly heard wife screaming and found patient unconscious on the floor with epistaxis and urinary incontinence. No seizure activity. Confused when regained consciousness and mildly lethargic. BP 233/141 and nicardipine drip started . CT scan performed showing small SDH.  Neurology consulted with EEG suggestive of cortical dysfunction in right hemisphere likely due to underlying SDH. Also moderate to severe diffuse encephalopathy. Nonspecific etiology but could be due to sedation. No seizures or epileptiform discharges seen. Extubated on 11/8. Acute UTI due to E. Coli  treated with IV Ceftriaxone for 5 days.  Repeat CT showed stability of SDH and Intraparenchymal hemorrhage. Cerebral edema resolved. Therapy evaluations completed with recommendations for CIR. Patient admitted 02/26/21.  Patient demonstrates mild cognitive impairments impacting complex problem solving, recall of functional information and error awareness during  informal tasks. Function is also impacted by pain and fatigue. Patient was administered the Cognistat and demonstrated mild deficits in orientation, short-term recall and judgement and moderate-severe deficits with visual spatial tasks. Patient would benefit from skilled SLP intervention to maximize his cognitive functioning and overall functional independence prior to discharge.    Skilled Therapeutic Interventions          Administered a cognitive-linguistic evaluation, please see above  for details. SLP provided external aids to maximize orientation to time and place in which he was able to utilize with supervision level verbal cues.    SLP Assessment  Patient will need skilled [place] [facility] during CIR admission    Recommendations  Oral Care Recommendations: Oral care BID Recommendations for Other Services: Neuropsych consult Patient destination: Home Follow up Recommendations: 24 hour supervision/assistance;Outpatient SLP Equipment Recommended: None recommended by SLP    SLP Frequency 3 to 5 out of 7 days   SLP Duration  SLP Intensity  SLP Treatment/Interventions 2-2.5 weeks  Minumum of 1-2 x/day, 30 to 90 minutes  Cognitive remediation/compensation;Internal/external aids;Therapeutic Activities;Environmental controls;Cueing hierarchy;Functional tasks;Patient/family education    Pain 6/10 pain (headache) RN aware and administered medications   SLP Evaluation Cognition Overall Cognitive Status: Impaired/Different from baseline Orientation Level: Oriented X4 Sustained Attention: Impaired Sustained Attention Impairment: Verbal basic;Functional basic Memory: Impaired Memory Impairment: Decreased short term memory;Decreased recall of new information;Retrieval deficit Decreased Short Term Memory: Verbal basic;Functional basic Awareness: Impaired Awareness Impairment: Intellectual impairment Problem Solving: Impaired Problem Solving Impairment: Functional complex Safety/Judgment: Impaired  Comprehension Auditory Comprehension Overall Auditory Comprehension: Appears within functional limits for tasks assessed Yes/No Questions: Within Functional Limits Commands: Within Functional Limits Conversation: Simple Expression Expression Primary Mode of Expression: Verbal Verbal Expression Overall Verbal Expression: Appears within functional limits for tasks assessed Oral Motor Oral Motor/Sensory Function Overall Oral Motor/Sensory Function:  Within functional limits Motor Speech Overall Motor Speech: Appears within functional limits for tasks assessed  Care Tool Care Tool Cognition Ability to hear (with hearing aid or hearing appliances if normally used Ability to hear (with hearing aid or hearing appliances if normally used): 1. Minimal difficulty - difficulty in some environments (e.g. when person speaks softly or setting is noisy)   Expression of Ideas and Wants Expression of Ideas and Wants: 3. Some difficulty - exhibits some difficulty with expressing needs and ideas (e.g, some words or finishing thoughts) or speech is not clear   Understanding Verbal and Non-Verbal Content Understanding Verbal and Non-Verbal Content: 3. Usually understands - understands most conversations, but misses some part/intent of message. Requires cues at times to understand  Memory/Recall Ability Memory/Recall Ability : That he or she is in a hospital/hospital unit   Short Term Goals: Week 1: SLP Short Term Goal 1 (Week 1): Patient will demonstrate complex problem solving for functional tasks with Min verbal cues. SLP Short Term Goal 2 (Week 1): Patient will recall new, daily information with supervision level verbal cues. SLP Short Term Goal 3 (Week 1): Patient will self-monitor and correct errors during functional tasks with Min verbal cues. SLP Short Term Goal 4 (Week 1): Patient will demonstrate sustained attention to functional tasks for 45 minutes with Min verbal cues for redirection.  Refer to Care Plan for Long Term Goals  Recommendations for other services: Neuropsych  Discharge Criteria: Patient will be discharged from SLP if patient refuses treatment 3 consecutive times without medical reason, if treatment goals not met, if there is a change in medical status, if patient makes no  progress towards goals or if patient is discharged from hospital.  The above assessment, treatment plan, treatment alternatives and goals were discussed and  mutually agreed upon: by patient  Rudie Sermons 03/01/2021, 12:03 PM

## 2021-03-01 NOTE — Progress Notes (Signed)
Bowel Program imitated this shift, patient did not have a bowel movement just yet. Oncoming nurse to continue Bowel program. Oncoming nurse notified.

## 2021-03-01 NOTE — Progress Notes (Addendum)
PROGRESS NOTE   Subjective/Complaints: Did fairly well last night. Had a bm. Headache is mild  ROS: Patient denies fever, rash, sore throat, blurred vision, nausea, vomiting, diarrhea, cough, shortness of breath or chest pain, joint or back pain,  or mood change.    Objective:   No results found. Recent Labs    02/28/21 0801  WBC 6.8  HGB 13.5  HCT 40.9  PLT 159   Recent Labs    02/28/21 0801  NA 134*  K 3.7  CL 99  CO2 28  GLUCOSE 93  BUN 11  CREATININE 0.90  CALCIUM 8.6*    Intake/Output Summary (Last 24 hours) at 03/01/2021 1023 Last data filed at 03/01/2021 0810 Gross per 24 hour  Intake 477 ml  Output 1030 ml  Net -553 ml     Pressure Injury 02/26/21 Coccyx Mid;Posterior Stage 2 -  Partial thickness loss of dermis presenting as a shallow open injury with a red, pink wound bed without slough. (Active)  02/26/21 1702  Location: Coccyx  Location Orientation: Mid;Posterior  Staging: Stage 2 -  Partial thickness loss of dermis presenting as a shallow open injury with a red, pink wound bed without slough.  Wound Description (Comments):   Present on Admission: Yes    Physical Exam: Vital Signs Blood pressure 139/68, pulse 63, temperature 97.8 F (36.6 C), temperature source Oral, resp. rate 18, height 6' (1.829 m), weight 72.2 kg, SpO2 91 %.  Constitutional: No distress . Vital signs reviewed. HEENT: NCAT, EOMI, oral membranes moist Neck: supple Cardiovascular: RRR without murmur. No JVD    Respiratory/Chest: CTA Bilaterally without wheezes or rales. Normal effort    GI/Abdomen: BS +, non-tender, non-distended Ext: no clubbing, cyanosis, or edema Psych: flat but interacting more Skin: sacral wound, scattered bruises on limbs Neuro:  oriented to person, ?hospital. Follows basic commands. Distracted. Moves all 4's. Seems to sense  pain. Musculoskeletal: no pain with ROM/palpation     Assessment/Plan: 1. Functional deficits which require 3+ hours per day of interdisciplinary therapy in a comprehensive inpatient rehab setting. Physiatrist is providing close team supervision and 24 hour management of active medical problems listed below. Physiatrist and rehab team continue to assess barriers to discharge/monitor patient progress toward functional and medical goals  Care Tool:  Bathing    Body parts bathed by patient: Right arm, Left arm, Chest, Abdomen, Front perineal area, Right upper leg, Left upper leg, Right lower leg, Left lower leg, Face   Body parts bathed by helper: Buttocks     Bathing assist Assist Level: Minimal Assistance - Patient > 75%     Upper Body Dressing/Undressing Upper body dressing   What is the patient wearing?: Pull over shirt    Upper body assist Assist Level: Supervision/Verbal cueing    Lower Body Dressing/Undressing Lower body dressing      What is the patient wearing?: Pants     Lower body assist Assist for lower body dressing: Moderate Assistance - Patient 50 - 74%     Toileting Toileting Toileting Activity did not occur (Clothing management and hygiene only): N/A (no void or bm)  Toileting assist Assist for toileting: Minimal Assistance - Patient >  75%     Transfers Chair/bed transfer  Transfers assist     Chair/bed transfer assist level: Contact Guard/Touching assist Chair/bed transfer assistive device: Programmer, multimedia   Ambulation assist      Assist level: Contact Guard/Touching assist Assistive device: Walker-rolling Max distance: 30 ft   Walk 10 feet activity   Assist     Assist level: Minimal Assistance - Patient > 75% Assistive device: Walker-rolling   Walk 50 feet activity   Assist Walk 50 feet with 2 turns activity did not occur: Safety/medical concerns (decreased activity tolerance)         Walk 150 feet activity   Assist Walk 150 feet activity did not occur:  Safety/medical concerns         Walk 10 feet on uneven surface  activity   Assist     Assist level: Minimal Assistance - Patient > 75% Assistive device: Walker-rolling   Wheelchair     Assist Is the patient using a wheelchair?: Yes Type of Wheelchair: Manual    Wheelchair assist level: Minimal Assistance - Patient > 75% Max wheelchair distance: 55 ft    Wheelchair 50 feet with 2 turns activity    Assist        Assist Level: Minimal Assistance - Patient > 75%   Wheelchair 150 feet activity     Assist      Assist Level: Total Assistance - Patient < 25%   Blood pressure 139/68, pulse 63, temperature 97.8 F (36.6 C), temperature source Oral, resp. rate 18, height 6' (1.829 m), weight 72.2 kg, SpO2 91 %.  Medical Problem List and Plan:   SAH/IPH due to hypertensive emergency- with impaired ADLs/mobility  -Continue CIR therapies including PT, OT, and SLP. Interdisciplinary team conference today to discuss goals, barriers to discharge, and dc planning.   Antithrombotics . DVT Prophylaxis/Anticoagulation: Mechanical: Sequential compression devices, below knee Bilateral lower extremities due to brain bleed 2. Pain Management: Norco for HA as needed and tylenol prn- will monitor for needing daily prevention meds for HA 3. Mood/sleep: emotional support- and monitor if needs  neuropsychology.   -antipsychotics: none  - sleep chart  -continue scheduled trazodone at night 4. Neuropsych- the patient is not  capable of making decisions for himself.  5. Skin/Wounds- routine wound care.  6. Fluids/Electrolytes/Nutrition: eating next to nothing  -encourage PO  -cognitive component 7. HTN in setting of hypertensive emergency-   -scheduled coreg, entresto,  and aldactone  -prn catapres and hydralazine  -continue low dose scheduled hydralazine 10mg  tid  11/15 bp's under much better control 8. CHF with EF 30-35%- con't Entresto and monitor   Filed Weights    02/28/21 1047  Weight: 72.2 kg   Follow weights  - 9. COPD- con't O2 and wean as tolerated-also continue his regimen of COPD meds- including Pulmicort, Albuterol prn, Brovana. And change as required.  10. Constipation- will order Sorbitol since it's been 1 week since last BM- and con't miralax prn and Colace.   11/15 had large bm yesterday 11. UTI- resolved- s/p IV rocephin 12. Smoker- tobacco- abuse- counseling and Nicoderm 21 mg daily 13. Daily headaches- con't Norco for now-    -resolving 14. Leg spasms- is chronic- cannot tell is restless leg syndrome or myoclonus- will monitor- might need to address since was very active.  15. Hx of prostate CA s/p XRT seeding 16. GERD- con't protonix and monitor/change as required 17. AAA/CAD/HLD- con't to monitor    LOS: 3 days  A FACE TO FACE EVALUATION WAS PERFORMED  Meredith Staggers 03/01/2021, 10:23 AM

## 2021-03-01 NOTE — Progress Notes (Signed)
Occupational Therapy Session Note  Patient Details  Name: [name] MRN: [id number] Date of Birth: Sep 30, 1945  Today's Date: 03/01/2021 OT Individual Time: 5945-8592 OT Individual Time Calculation (min): 73 min   Short Term Goals: Week 1:  OT Short Term Goal 1 (Week 1): Patient will complete toilet transfer with Min guard. OT Short Term Goal 2 (Week 1): Patient will complete shower transfer with Min guard. OT Short Term Goal 3 (Week 1): Patient will don LB clothing with Min gaurd.  Skilled Therapeutic Interventions/Progress Updates:    Pt greeted semi-reclined in bed and agreeable to OT treatment session. Pt reported need to urinate. Stand-pivot bed to [facility] with mod and 2 trials to get to standing using RW. Pt successfully voided bladder. Pt donned pants seated on BSC with min A, then pivoted to wc with min A. Pt brought down to therapy gym and OT addressed standing balance/endurance and problem solving skills. Pt stood on foam mat for balance strategies with min A for 2, 5 minute intervals. Problem solving and visuospatial skills with slide puzzle. Pt with difficulty deciphering between green and blue, as well as seeing yellow. Pt reports more difficulty telling the difference between colors lately. There-ex using NuStep for 8 minutes on level 4 for total body strength and conditioning. Addressed functional ambulation and balance with ambulating in hallway with RW and min/mod A with fatigue. Pt returned to room and left semi-reclined in bed with bed alarm on, call bell in reach, and needs met.   Therapy Documentation Precautions:  Precautions Precautions: Fall Precaution Comments: Labile BP, monitor with mobility Restrictions Weight Bearing Restrictions: No Pain: Pain Assessment Pain Score: 4  Back Chronic pain Rest and repositioned for comfort   Therapy/Group: Individual Therapy  Valma Cava 03/01/2021, 1:55 PM

## 2021-03-02 MED ORDER — CYCLOBENZAPRINE HCL 5 MG PO TABS
5.0000 mg | ORAL_TABLET | Freq: Every day | ORAL | Status: DC
  Administered 2021-03-02: 5 mg via ORAL
  Filled 2021-03-02: qty 1

## 2021-03-02 NOTE — Progress Notes (Signed)
Physical Therapy Session Note  Patient Details  Name: [name] MRN: [id number] Date of Birth: 07-May-1945  Today's Date: 03/02/2021 PT Individual Time: 2440-1027 PT Individual Time Calculation (min): 56 min   Short Term Goals: Week 1:  PT Short Term Goal 1 (Week 1): Patient will perform basic transfers with CGA >75% of the time using LRAD. PT Short Term Goal 2 (Week 1): Patient will ambulate >100 feet with CGA using LRAD. PT Short Term Goal 3 (Week 1): Patient will initiate stair training for increased endurance/strength with functional mobility. PT Short Term Goal 4 (Week 1): Patient will improve his Berg Score by >8 points to meet the MCID.  Skilled Therapeutic Interventions/Progress Updates:     Pt received supine in bed asleep. Easily roused and agreeable to therapy. Reports pain in knees. Number not provided. PT provides mobility and rest breaks to manage pain. Supine to sit from flat bed with use of bed rails with verbal cues on positioning. Sit to stand and stand pivot transfer to [facility] with RW and minA due to pt attempting to sit prior to being safely in front of chair. PT educates pt on importance of contacting chair with backs of both legs prior to attempting to sit. WC transport to gym for time management. Pt transfers to Nustep without AD and with miNA and improved safety. Pt performs Nustep for AAROM of bilateral lower extremities for pain relief, as well as strength, endurance, and reciprocal coordination training. Pt cued to keep speed at ~40 steps per minute. Pt is able to maintain rate for periods of time but becomes distracted easily and slows down. PT redirects to task. Pt completes x15:00 at workload of 5.  Pt performs TUG test to assess risk for falls. PT explains TUG rationale and correct performance demonstrates to pt. Pt completes with following times:  35.2 seconds 34.2 seconds 29.4 seconds  Between 2nd and 3rd trial PT provides pt with cue to increase stride  length, as pt's baseline gait pattern has very short strides. Pt incorporates cueing with noted improvement in TUG time.  Pt ambulates x250' with RW and close supervision, with cues to increase stride length and upright gaze to decrease risk for falls. WC transport back to room. Pt left supine with alarm intact and all needs within reach.  Therapy Documentation Precautions:  Precautions Precautions: Fall Precaution Comments: Labile BP, monitor with mobility Restrictions Weight Bearing Restrictions: No   Therapy/Group: Individual Therapy  Breck Coons 03/02/2021, 9:44 AM

## 2021-03-02 NOTE — Progress Notes (Signed)
Speech Language Pathology Daily Session Note  Patient Details  Name: [name] MRN: [id number] Date of Birth: 08/24/1945  Today's Date: 03/02/2021 SLP Individual Time: 4270-6237 SLP Individual Time Calculation (min): 40 min  Short Term Goals: Week 1: SLP Short Term Goal 1 (Week 1): Patient will demonstrate complex problem solving for functional tasks with Min verbal cues. SLP Short Term Goal 2 (Week 1): Patient will recall new, daily information with supervision level verbal cues. SLP Short Term Goal 3 (Week 1): Patient will self-monitor and correct errors during functional tasks with Min verbal cues. SLP Short Term Goal 4 (Week 1): Patient will demonstrate sustained attention to functional tasks for 45 minutes with Min verbal cues for redirection.  Skilled Therapeutic Interventions: Skilled treatment session focused on cognitive goals. Upon arrival, patient appeared lethargic and reported a 7/10 headache. RN aware and reported patient was premedicated. SLP facilitated session by providing extra time and overall Mod A verbal and visual cues for problem solving and sustained attention during a basic money management task. Suspect function impacted by pain and fatigue. Patient with mild, intermittent language of confusion and asking if his wife was spending the night tonight (admitted to CIR today). SLP explained the patient could not sleep in his room, he reported, "I know that." SLP provided education regarding rehab process, length of stay, etc. Patient declined seeing her during session and reported he would wait for one of his sons to come tonight. Patient left upright in bed with alarm on and all needs within reach. Continue with current plan of care.      Pain 7/10 headache RN aware, patient premedicated   Therapy/Group: Individual Therapy  Sathvik Tiedt, [place] 03/02/2021, 3:13 PM

## 2021-03-02 NOTE — Progress Notes (Signed)
Occupational Therapy Session Note  Patient Details  Name: [name] MRN: [id number] Date of Birth: 1945-05-21  Today's Date: 03/02/2021 OT Individual Time: 6144-3154 session 1 OT Individual Time Calculation (min): 68 min session 1 Session 2: 1500-1530 ( 30 mins)  Short Term Goals: Week 1:  OT Short Term Goal 1 (Week 1): Patient will complete toilet transfer with Min guard. OT Short Term Goal 2 (Week 1): Patient will complete shower transfer with Min guard. OT Short Term Goal 3 (Week 1): Patient will don LB clothing with Min gaurd.  Skilled Therapeutic Interventions/Progress Updates:  Session 1:Pt greeted supine in bed asleep but easily able to arouse and  agreeable to OT intervention. Session focus on  BADL reeducation and ADL transfers. Pt reports 6/10 pain in head and BLEs during session, provided rest breaks as needed during session. Pt completed supine>sit with CGA with increased time and effort as pt endorse dizziness, education provided on keeping eyes open during transitional movements and focus on a stabilized object. Pt completed dressing from EOB with s/u asssit for UB dressing and CGA for LB dressing only needing assist for balance when pt sit<>stand to pull pants up to waist line. Pt donned slide on shoes with set- up assit. Pt completed ambualtory toilet transer with Rw and CGA, CGA for 3/3 toileting tasks just needing assist for balance during sit<>stand. Pt completed standing grooming tasks at sink with CGA. Pt completed functional mobility >132ft to rehab gym with rw and CGA, verbal/visual cues needed to increase stride length as pt present with short steps. Pt completed functional sit<>stands trials from EOM with pt needing CGA to compelte x15 stands from various surface heights, good hand placement and technique noted. Pt additionally completed ambulatory shower transfer to walk in shower. Pt reports his shower at home has seat in it with no arm rests and grab bar on R side  when entering. Pt needed MOD A to enter shower with pt using grab bar on R side to step over threshold and holding onto RW on L side however pt completed additional trial with pt stepping backwards over threshold with BUE support on Rw with more of MIN A, currently recommend backwards technique. Pt transported back to room from w/c with total A from w/c. CGA for stand pivot back to bed with Rw. CGA for sit>supine, pt left supine in bed with bed alarm activated and all needs within reach.                       Session 2:pt greeted supine in bed reporting 7/10 pain in posterior aspect of pts head. Pt reports pain meds "aren't touching" his pain. Pt describes his pain as "burning" offered ice pack and placed behind pts head while focusing on deep breathing as pain mgmt strategy. Spent some times providing education on SDH and symptoms associated with this condition. Pt was agreeable to light therex from bed level, pt complete BUE therex as indicated below with level 1 theraband to facilitate increased BUE strength for higher level functional mobility tasks , issued pt HEP to increase carryover.  X10 chest presses X10 shoulder flexion X10 punches  Pt left supine in bed with bed alarm activated and all needs within reach.   Therapy Documentation Precautions:  Precautions Precautions: Fall Precaution Comments: Labile BP, monitor with mobility Restrictions Weight Bearing Restrictions: No      Therapy/Group: Individual Therapy  [facility] 03/02/2021, 11:56 AM

## 2021-03-02 NOTE — Progress Notes (Signed)
Patient continues to have constant right orbital HA--but has been better today with nurse offering hydrocodone and ice. Sleep affected due to pain. He feels ice works best and it feels better with neck extended. Will schedule ice TID as well as flexeril 5 mg at bedtime to see if this will help with MS aspect. Order sleep wake chart.

## 2021-03-02 NOTE — Progress Notes (Signed)
Inpatient Rehabilitation Care Coordinator Assessment and Plan Patient Details  Name: [name] MRN: [id number] Date of Birth: [birth year]  Today's Date: 03/02/2021  Hospital Problems: Principal Problem:   Intraparenchymal hemorrhage of brain [facility]) Active Problems:   Pressure injury of skin  Past Medical History:  Past Medical History:  Diagnosis Date   AAA (abdominal aortic aneurysm)    a.  CTA 7/19: measured 4.5 x 5.3 cm and greatest transverse dimensions   Abnormal nuclear stress test    a.  Myoview 2012: anterior wall ischemia with an estimated EF of 26%.  This was a new wall motion abnormality as well as a newly reduced EF   COPD (chronic obstructive pulmonary disease) (HCC)    Coronary artery disease    a.  Posterior MI in 2002 status post BMS to the LCx; b. Kerr 2012: 95% stenosis of the proximal LAD, 95% stenosis of the mid LAD, 30% in-stent restenosis of the mid left circumflex with a second lesion of diffuse 50% stenosis.  The patient underwent successful PCI/BMS to the mid LAD with 0% residual stenosis, LV gram not performed   GERD (gastroesophageal reflux disease)    History of kidney stones    History of prostate cancer    Hyperlipidemia    Hypertension    Myocardial infarction [facility])    Neuromuscular disorder ([place])    Prostate cancer ([place])    a.  Status post seeding   PVD (peripheral vascular disease) ([place])    Stroke [facility])    Past Surgical History:  Past Surgical History:  Procedure Laterality Date   BACK SURGERY  2009/2010   x 2   CARDIAC CATHETERIZATION  2002   stent placement [place]   ENDOVASCULAR REPAIR/STENT GRAFT N/A 01/02/2018   Procedure: ENDOVASCULAR REPAIR/STENT GRAFT;  Surgeon: Algernon Huxley, MD;  Location: [place] [facility];  Service: Cardiovascular;  Laterality: N/A;   FOOT SURGERY     bilateral    HERNIA REPAIR     bilateral    KNEE SURGERY     right knee    PROSTATE SURGERY  09/2009   prostate implant   SPINE SURGERY     Social  History:  reports that he has been smoking cigarettes. He has a 14.00 pack-year smoking history. He has never used smokeless tobacco. He reports that he does not drink alcohol and does not use drugs.  Family / Support Systems Marital Status: Married How Long?: 30years Patient Roles: Spouse, Parent Spouse/Significant Other: [name] (wife) Children: blended family- 5 living children. Pt recently lost his dtr in 06/2020. Other Supports: None reported Anticipated Caregiver: ?wife Ability/Limitations of Caregiver: Reports of wife currenlty being in the hospital and pt was primary caregiver. She is unsure if her sons will be able to assist. Pt states everyone works as well. Caregiver Availability: 24/7 Family Dynamics: Pt lives with his wife and cares for her.  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[employer]: [place] - How often do you need to have someone help you when you read instructions, pamphlets, or other written material from your doctor or pharmacy?: Never Writes: Yes Employment Status: Retired Date Retired/Disabled/Unemployed: 2000 Age Retired: 81 Public relations account executive Issues: DWI- 4 years ago Guardian/Conservator: N/A   Abuse/Neglect Abuse/Neglect Assessment Can Be Completed: Yes Physical Abuse: Denies Verbal Abuse: Denies Sexual Abuse: Denies Exploitation of patient/patient's resources: Denies Self-Neglect: Denies  Patient response to: Social Isolation - How  often do you feel lonely or isolated from those around you?: Never  Emotional Status Pt's affect, behavior and adjustment status: Pt in good spirits, but obviously tired from therapy. Recent Psychosocial Issues: Denies Psychiatric History: Denies Substance Abuse History: Pt admits he smokes 1/2 ppd. Pt states he quit drinking after his DWI 4 yrs ago. Unable to indicate amount he used to drink.  Patient  / Family Perceptions, Expectations & Goals Pt/Family understanding of illness & functional limitations: Pt and wife have a general undersanding of pt care needs Premorbid pt/family roles/activities: INdependent Anticipated changes in roles/activities/participation: Assistance with ADLs/IADLs Pt/family expectations/goals: Pt goal is "getting up on own and going to the bathroom; work on balance."  [employer]: None Premorbid Home Care/DME Agencies: None Transportation available at discharge: TBD Is the patient able to respond to transportation needs?: Yes In the past 12 months, has lack of transportation kept you from medical appointments or from getting medications?: No In the past 12 months, has lack of transportation kept you from meetings, work, or from getting things needed for daily living?: No Resource referrals recommended: Neuropsychology  Discharge Planning Living Arrangements: Spouse/significant other Support Systems: Spouse/significant other Type of Residence: Private residence Insurance Resources: Multimedia programmer (specify) ([employer]) Financial Resources: Radio broadcast assistant Screen Referred: No Living Expenses: Own Money Management: Patient Does the patient have any problems obtaining your medications?: No Home Management: Pt managaed all homeacre needs Patient/Family Preliminary Plans: TBD Care Coordinator Barriers to Discharge: Decreased caregiver support, Lack of/limited family support Care Coordinator Anticipated Follow Up Needs: HH/OP  Clinical Impression SW met with pt in room to introduce self, explain role, and discuss discharge process. 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 Has HCPOA but unsure on who it is. DME: cane, SPC, RW, electric scooter and wheelchair, and transport chair (keeps in car).   Pippa Hanif A Yesika Rispoli 03/02/2021, 4:21 PM

## 2021-03-03 DIAGNOSIS — E440 Moderate protein-calorie malnutrition: Secondary | ICD-10-CM | POA: Diagnosis not present

## 2021-03-03 DIAGNOSIS — I5042 Chronic combined systolic (congestive) and diastolic (congestive) heart failure: Secondary | ICD-10-CM | POA: Diagnosis not present

## 2021-03-03 DIAGNOSIS — I619 Nontraumatic intracerebral hemorrhage, unspecified: Secondary | ICD-10-CM | POA: Diagnosis not present

## 2021-03-03 DIAGNOSIS — K5901 Slow transit constipation: Secondary | ICD-10-CM | POA: Diagnosis not present

## 2021-03-03 DIAGNOSIS — I10 Essential (primary) hypertension: Secondary | ICD-10-CM | POA: Diagnosis not present

## 2021-03-03 MED ORDER — TRAZODONE HCL 50 MG PO TABS
50.0000 mg | ORAL_TABLET | Freq: Every day | ORAL | Status: DC
  Administered 2021-03-03 – 2021-03-05 (×3): 50 mg via ORAL
  Filled 2021-03-03 (×3): qty 1

## 2021-03-03 MED ORDER — TOPIRAMATE 25 MG PO TABS
25.0000 mg | ORAL_TABLET | Freq: Two times a day (BID) | ORAL | Status: DC
  Administered 2021-03-03 – 2021-03-06 (×7): 25 mg via ORAL
  Filled 2021-03-03 (×7): qty 1

## 2021-03-03 MED ORDER — DICLOFENAC SODIUM 1 % EX GEL
2.0000 g | Freq: Three times a day (TID) | CUTANEOUS | Status: DC
  Administered 2021-03-03 – 2021-03-15 (×27): 2 g via TOPICAL
  Filled 2021-03-03: qty 100

## 2021-03-03 MED ORDER — CYCLOBENZAPRINE HCL 5 MG PO TABS
5.0000 mg | ORAL_TABLET | Freq: Every day | ORAL | Status: DC
  Administered 2021-03-03 – 2021-03-15 (×13): 5 mg via ORAL
  Filled 2021-03-03 (×13): qty 1

## 2021-03-03 NOTE — Progress Notes (Addendum)
Speech Language Pathology Daily Session Note  Patient Details  Name: GEOGE LAWRANCE MRN: [id number] Date of Birth: 12-30-1945  Today's Date: 03/03/2021 SLP Individual Time: 5093-2671 SLP Individual Time Calculation (min): 14 min and Today's Date: 03/03/2021 SLP Missed Time: 31 Minutes Missed Time Reason: Patient fatigue  Short Term Goals: Week 1: SLP Short Term Goal 1 (Week 1): Patient will demonstrate complex problem solving for functional tasks with Min verbal cues. SLP Short Term Goal 2 (Week 1): Patient will recall new, daily information with supervision level verbal cues. SLP Short Term Goal 3 (Week 1): Patient will self-monitor and correct errors during functional tasks with Min verbal cues. SLP Short Term Goal 4 (Week 1): Patient will demonstrate sustained attention to functional tasks for 45 minutes with Min verbal cues for redirection.  Skilled Therapeutic Interventions: Pt received semi reclined in bed on arrival. Pt roused to verbal stimuli although unable to maintain alertness for >30 second intervals despite consistent verbal and tactile stimulation, elevating HOB, turning on all lights. Pt responded to biographical yes/no questions and regarding his personal comfort in bed. He also expressed he was hungry for lunch (tray untouched at bedside) however did not demonstrate appropriate alertness for safe PO consumption. Pt missed 31 minutes of ST treatment due to lethargy. ST followed up with nursing who is aware of lethargy.  Therapy/Group: Individual Therapy  Patty Sermons 03/03/2021, 2:46 PM

## 2021-03-03 NOTE — Progress Notes (Signed)
Occupational Therapy Session Note  Patient Details  Name: [name] MRN: [id number] Date of Birth: [birth year]  Today's Date: 03/03/2021 OT Individual Time: 2575-0518 OT Individual Time Calculation (min): 16 min  and Today's Date: 03/03/2021 OT Missed Time: 44 Minutes Missed Time Reason: Patient fatigue   Short Term Goals: Week 1:  OT Short Term Goal 1 (Week 1): Patient will complete toilet transfer with Min guard. OT Short Term Goal 2 (Week 1): Patient will complete shower transfer with Min guard. OT Short Term Goal 3 (Week 1): Patient will don LB clothing with Min gaurd.  Skilled Therapeutic Interventions/Progress Updates:    Pt greeted asleep sitting up in wc. Pt very lethargic and unable to maintain alertness. OT turned on lights and pt able to wake enough to ask to lay down. Pt needed more than reasonable amount of time as well as tactile and verbal cues to initiate sit<>stand with RW. Pt then very slowly pivoted back to bed with min A. Pt began dosing off again seated EOB and was unable to follow commands to lay back down, requiring OT assist to lift LE's back in bed. Pt with eyes closed sleeping again once back in bed. Pt missed 44 minutes of OT treatment session due to lethargy. OT to follow up and discuss with nursing.   Therapy Documentation Precautions:  Precautions Precautions: Fall Precaution Comments: Labile BP, monitor with mobility Restrictions Weight Bearing Restrictions: No General: General OT Amount of Missed Time: 44 Minutes  Pain: Pain Assessment Pain Scale: 0-10 No pain reported  Therapy/Group: Individual Therapy  Valma Cava 03/03/2021, 11:22 AM

## 2021-03-03 NOTE — Progress Notes (Signed)
Patient ID: [name], male   DOB: [birth year], 75 y.o.   MRN: [id number]  SW left message for pt wife [name] [phone]) and waiting on follow-up.   Loralee Pacas, MSW, [place] Office: [phone] Cell: [phone] Fax: [phone]

## 2021-03-03 NOTE — Progress Notes (Addendum)
PROGRESS NOTE   Subjective/Complaints: Pt reports that headaches are "better" but still reporting them frequently. Says that both of his knees hurt this morning. They have hurt prior to admit  ROS: Limited due to cognitive/behavioral    Objective:   No results found. No results for input(s): WBC, HGB, HCT, PLT in the last 72 hours.  No results for input(s): NA, K, CL, CO2, GLUCOSE, BUN, CREATININE, CALCIUM in the last 72 hours.   Intake/Output Summary (Last 24 hours) at 03/03/2021 4235 Last data filed at 03/03/2021 0700 Gross per 24 hour  Intake 720 ml  Output 200 ml  Net 520 ml     Pressure Injury 02/26/21 Coccyx Mid;Posterior Stage 2 -  Partial thickness loss of dermis presenting as a shallow open injury with a red, pink wound bed without slough. (Active)  02/26/21 1702  Location: Coccyx  Location Orientation: Mid;Posterior  Staging: Stage 2 -  Partial thickness loss of dermis presenting as a shallow open injury with a red, pink wound bed without slough.  Wound Description (Comments):   Present on Admission: Yes    Physical Exam: Vital Signs Blood pressure (!) 124/53, pulse 62, temperature 97.8 F (36.6 C), temperature source Oral, resp. rate 16, height 6' (1.829 m), weight 72.2 kg, SpO2 94 %.  Constitutional: No distress . Vital signs reviewed. HEENT: NCAT, EOMI, oral membranes moist Neck: supple Cardiovascular: RRR without murmur. No JVD    Respiratory/Chest: CTA Bilaterally without wheezes or rales. Normal effort    GI/Abdomen: BS +, non-tender, non-distended Ext: no clubbing, cyanosis, or edema Psych: flat, difficult to engage today Skin: sacral wound, scattered bruises on limbs Neuro:  oriented to person, ?hospital. Follows basic commands. Distracted. Moves all 4's. Seems to sense  pain. Musculoskeletal: no pain with ROM/palpation. Chronic jt changes at knees but no effusion or  pain   Assessment/Plan: 1. Functional deficits which require 3+ hours per day of interdisciplinary therapy in a comprehensive inpatient rehab setting. Physiatrist is providing close team supervision and 24 hour management of active medical problems listed below. Physiatrist and rehab team continue to assess barriers to discharge/monitor patient progress toward functional and medical goals  Care Tool:  Bathing    Body parts bathed by patient: Right arm, Left arm, Chest, Abdomen, Front perineal area, Right upper leg, Left upper leg, Right lower leg, Left lower leg, Face   Body parts bathed by helper: Buttocks     Bathing assist Assist Level: Minimal Assistance - Patient > 75%     Upper Body Dressing/Undressing Upper body dressing   What is the patient wearing?: Pull over shirt    Upper body assist Assist Level: Set up assist    Lower Body Dressing/Undressing Lower body dressing      What is the patient wearing?: Pants     Lower body assist Assist for lower body dressing: Contact Guard/Touching assist     Toileting Toileting Toileting Activity did not occur (Clothing management and hygiene only): N/A (no void or bm)  Toileting assist Assist for toileting: Contact Guard/Touching assist     Transfers Chair/bed transfer  Transfers assist     Chair/bed transfer assist level: Contact Guard/Touching assist Chair/bed  transfer assistive device: Museum/gallery exhibitions officer assist      Assist level: Contact Guard/Touching assist Assistive device: Walker-rolling Max distance: 30 ft   Walk 10 feet activity   Assist     Assist level: Minimal Assistance - Patient > 75% Assistive device: Walker-rolling   Walk 50 feet activity   Assist Walk 50 feet with 2 turns activity did not occur: Safety/medical concerns (decreased activity tolerance)         Walk 150 feet activity   Assist Walk 150 feet activity did not occur: Safety/medical  concerns         Walk 10 feet on uneven surface  activity   Assist     Assist level: Minimal Assistance - Patient > 75% Assistive device: Walker-rolling   Wheelchair     Assist Is the patient using a wheelchair?: Yes Type of Wheelchair: Manual    Wheelchair assist level: Minimal Assistance - Patient > 75% Max wheelchair distance: 55 ft    Wheelchair 50 feet with 2 turns activity    Assist        Assist Level: Minimal Assistance - Patient > 75%   Wheelchair 150 feet activity     Assist      Assist Level: Total Assistance - Patient < 25%   Blood pressure (!) 124/53, pulse 62, temperature 97.8 F (36.6 C), temperature source Oral, resp. rate 16, height 6' (1.829 m), weight 72.2 kg, SpO2 94 %.  Medical Problem List and Plan:   SAH/IPH due to hypertensive emergency- with impaired ADLs/mobility  -Continue CIR therapies including PT, OT, and SLP  Antithrombotics . DVT Prophylaxis/Anticoagulation: Mechanical: Sequential compression devices, below knee Bilateral lower extremities due to brain bleed 2. Pain Management: Norco for HA as needed and tylenol prn-    -11/17 begin topamax 25mg  bid for prophylaxis   -add voltaren gel for bilateral knee pain 3. Mood/sleep: emotional support- and monitor if needs  neuropsychology.   -antipsychotics: none  - sleep chart  -continue scheduled trazodone at night 4. Neuropsych- the patient is not  capable of making decisions for himself.  5. Skin/Wounds- routine wound care.  6. Fluids/Electrolytes/Nutrition: eating next to nothing  -encourage PO  -cognitive component 7. HTN in setting of hypertensive emergency-   -scheduled coreg, entresto,  and aldactone  -prn catapres and hydralazine  -continue low dose scheduled hydralazine 10mg  tid  11/17 bp's under much better control 8. CHF with EF 30-35%- con't Entresto and monitor   Filed Weights   02/28/21 1047  Weight: 72.2 kg   Follow weights  -need them  recorded 9. COPD- con't O2 and wean as tolerated-also continue his regimen of COPD meds- including Pulmicort, Albuterol prn, Brovana. And change as required.  10. Constipation- will order Sorbitol since it's been 1 week since last BM- and con't miralax prn and Colace.   11/17 moving bowels 11. UTI- resolved- s/p IV rocephin 12. Smoker- tobacco- abuse- counseling and Nicoderm 21 mg daily 13. Daily headaches- con't Norco for now-    -resolving 14. Leg spasms- is chronic- cannot tell is restless leg syndrome or myoclonus- will monitor- might need to address since was very active.  15. Hx of prostate CA s/p XRT seeding 16. GERD- con't protonix and monitor/change as required 17. AAA/CAD/HLD- con't to monitor    LOS: 5 days A FACE TO FACE EVALUATION WAS PERFORMED  Meredith Staggers 03/03/2021, 9:27 AM

## 2021-03-03 NOTE — Progress Notes (Signed)
Physical Therapy Session Note  Patient Details  Name: [name] MRN: [id number] Date of Birth: 1946-03-02  Today's Date: 03/03/2021 PT Individual Time: 0920-1030 PT Individual Time Calculation (min): 70 min   Short Term Goals: Week 1:  PT Short Term Goal 1 (Week 1): Patient will perform basic transfers with CGA >75% of the time using LRAD. PT Short Term Goal 2 (Week 1): Patient will ambulate >100 feet with CGA using LRAD. PT Short Term Goal 3 (Week 1): Patient will initiate stair training for increased endurance/strength with functional mobility. PT Short Term Goal 4 (Week 1): Patient will improve his Berg Score by >8 points to meet the MCID.  Skilled Therapeutic Interventions/Progress Updates:     Pt received supine in bed asleep. Rouses slowly and is agreeable to therapy, but appears lethargic throughout. Pt states that he does not sleep much at night and PT educates on importance of remaining awake and active during the day to acclimate to more consistent sleep patterns. Pt performs supine to sit slowly with bed features and cueing for positioning at EOB. Pt performs stand pivot transfer to [facility] with minA and demonstrating decreased safety, attempting to sit prior to being safely in front of WC. PT educates on safety awareness and safe transfers. Pt transport to gym via [facility] for time management. Pt ambulates x100' with RW and minA. PT cues pt to ambulate with "long strides and as quickly as possible." Pt, however, ambulates extremely slowly with very short stride lengths, and becomes distracted throughout ambulation, causing him to ambulate even more slowly. Toward end of ambulation pt stops in mid gait cycle and appears to have lost focus on task. MinA provided primarily to manual facilitate increased gait speed. Following, pt reporting bilateral knee pain and difficulty performing functional tasks due to pain. Pt attempts tossing ball at trampoline for balance training but is unable to  complete due to pain. Pt performs sit to supine with modA and PT provides moist heat on both knees for pain management. Moist heat applied for 10 minutes total with x2 skin assessments and no adverse reaction noted. Pt reports slight improvement in pain symptoms following modality. Supine to sit with modA. Stand pivot from mat>WC>toilet with minA. Pt continent of urine and ambulates to sink with CGA to wash hands. WC transport back to room. Pt left seated with alarm intact and all needs within reach. Encouraged to remain sitting for as long as possible to provide increased stimulation and endurance training.  2nd Session: Pt asleep and SLP outside room, saying pt too lethargic too participate in speech therapy session as he was falling asleep despite attempts to keep him awake. PT will follow up as appropriate.  Therapy Documentation Precautions:  Precautions Precautions: Fall Precaution Comments: Labile BP, monitor with mobility Restrictions Weight Bearing Restrictions: No   Therapy/Group: Individual Therapy  Breck Coons, PT, DPT 03/03/2021, 12:38 PM

## 2021-03-04 DIAGNOSIS — I619 Nontraumatic intracerebral hemorrhage, unspecified: Secondary | ICD-10-CM | POA: Diagnosis not present

## 2021-03-04 DIAGNOSIS — E440 Moderate protein-calorie malnutrition: Secondary | ICD-10-CM | POA: Diagnosis not present

## 2021-03-04 DIAGNOSIS — I10 Essential (primary) hypertension: Secondary | ICD-10-CM | POA: Diagnosis not present

## 2021-03-04 DIAGNOSIS — K5901 Slow transit constipation: Secondary | ICD-10-CM | POA: Diagnosis not present

## 2021-03-04 DIAGNOSIS — I5042 Chronic combined systolic (congestive) and diastolic (congestive) heart failure: Secondary | ICD-10-CM | POA: Diagnosis not present

## 2021-03-04 NOTE — Progress Notes (Signed)
Occupational Therapy Session Note  Patient Details  Name: [name] MRN: [id number] Date of Birth: [birth year]  Today's Date: 03/04/2021 OT Individual Time: 1303-1400 OT Individual Time Calculation (min): 57 min    Short Term Goals: Week 1:  OT Short Term Goal 1 (Week 1): Patient will complete toilet transfer with Min guard. OT Short Term Goal 2 (Week 1): Patient will complete shower transfer with Min guard. OT Short Term Goal 3 (Week 1): Patient will don LB clothing with Min gaurd.  Skilled Therapeutic Interventions/Progress Updates:    Pt greeted semi-reclined in bed awake after eating lunch. Pt agreeable to shower with encouragement. Pt needed more than reasonable amount of time to initiate bed mobility. Pt reported headeache and knee pain, but eventually able to get to EOB with HOB elevated and supervision. Sit<>stand w/ RW and CGA, then pt ambulated into bathroom with CGA and verbal +tactile cues for RW positioning and safety. Bathing completed sit<>stand from tub bench in shower with CGA for balance when standing to wash buttocks. Pt reported need to go to the bathroom after shower and ambulated to toilet with RW and CGA. Pt voided bladder. Pt donned pants seated on commode with verbal cues for clothing orientation. UB dressing completed seated EOB with set-up A. Pt reported feeling better but fatigued after shower. Pt completed grooming tasks with set-up A seated EOB. Pt declined to stay up to wc and returned to bed with close supervision. Pt left semi-reclined in bed with bed alarm on, call bell in reach, and needs met.   Therapy Documentation Precautions:  Precautions Precautions: Fall Precaution Comments: Labile BP, monitor with mobility Restrictions Weight Bearing Restrictions: No Pain: Pt reoprts 6/10 headache and 8/10 pain in [name]. Rest and repositioned for comfort   Therapy/Group: Individual Therapy  Valma Cava 03/04/2021, 1:57 PM

## 2021-03-04 NOTE — Progress Notes (Signed)
Physical Therapy Session Note  Patient Details  Name: [name] MRN: [id number] Date of Birth: [birth year]  Today's Date: 03/04/2021 PT Missed Time: 10 Minutes Missed Time Reason: Patient fatigue  Short Term Goals: Week 1:  PT Short Term Goal 1 (Week 1): Patient will perform basic transfers with CGA >75% of the time using LRAD. PT Short Term Goal 2 (Week 1): Patient will ambulate >100 feet with CGA using LRAD. PT Short Term Goal 3 (Week 1): Patient will initiate stair training for increased endurance/strength with functional mobility. PT Short Term Goal 4 (Week 1): Patient will improve his Berg Score by >8 points to meet the MCID.  Skilled Therapeutic Interventions/Progress Updates:     Pt misses 30 minutes of skilled PT due to lethargy.  Therapy Documentation Precautions:  Precautions Precautions: Fall Precaution Comments: Labile BP, monitor with mobility Restrictions Weight Bearing Restrictions: No General: PT Amount of Missed Time (min): 30 Minutes PT Missed Treatment Reason: Patient fatigue    Therapy/Group: Individual Therapy  Breck Coons 03/04/2021, 4:11 PM

## 2021-03-04 NOTE — Progress Notes (Addendum)
Speech Language Pathology Daily Session Note  Patient Details  Name: [name] MRN: [id number] Date of Birth: 14-Jul-1945  Today's Date: 03/04/2021 SLP Individual Time: 0800-0850 SLP Individual Time Calculation (min): 50 min SLP Missed Time: 10 minutes Missed Time Reason: Patient fatigue  Short Term Goals: Week 1: SLP Short Term Goal 1 (Week 1): Patient will demonstrate complex problem solving for functional tasks with Min verbal cues. SLP Short Term Goal 2 (Week 1): Patient will recall new, daily information with supervision level verbal cues. SLP Short Term Goal 3 (Week 1): Patient will self-monitor and correct errors during functional tasks with Min verbal cues. SLP Short Term Goal 4 (Week 1): Patient will demonstrate sustained attention to functional tasks for 45 minutes with Min verbal cues for redirection.  Skilled Therapeutic Interventions: Pt received upright in bed and agreeable to skilled ST intervention with focus on cognitive goals. Pt had just finished consuming breakfast on arrival. Consumed ~75% of meal. Pt maintained alertness for 40 minutes with mod-to-max A verbal cues to redirect throughout session. He then became increasingly drowsy and was unable to maintain arousal despite all attempts and missed 10 minutes of ST intervention. Pt was oriented to person/place/month/year/situation. He required min A verbal cues for use of external aid to orient to day of week and date. SLP facilitated session by providing mod A verbal/visual cues for use of external memory aids and writing down pertinent information pertaining to personal health. Pt expressed he takes his spouse's vitals on a daily basis therefore SLP simulated this process by taking pt's vitals and having him record information on notebook paper as he typically would at home. Pt required max A to locate information after it was written. Unfortunately his legibility was poor and patient was unable to effectively read his own  handwriting. Lethargy suspected to contribute to difficulty. Pt's vitals were [facility] with the exception of blood pressure being 101/55 in lying position. Nurse made aware. Pt was not symptomatic. Session concluded 10 minutes early due to lethargy. Patient was left in bed with alarm activated and immediate needs within reach at end of session. Continue per current plan of care.      Pain Pain Assessment Pain Scale: 0-10 Pain Score: 0-No pain  Therapy/Group: Individual Therapy  Tu Bayle T Meylin Stenzel 03/04/2021, 8:10 AM

## 2021-03-04 NOTE — Progress Notes (Signed)
PROGRESS NOTE   Subjective/Complaints: Says knees still hurt. Headaches are better. Didn't sleep that well  ROS: Patient denies fever, rash, sore throat, blurred vision, nausea, vomiting, diarrhea, cough, shortness of breath or chest pain,  or mood change.    Objective:   No results found. No results for input(s): WBC, HGB, HCT, PLT in the last 72 hours.  No results for input(s): NA, K, CL, CO2, GLUCOSE, BUN, CREATININE, CALCIUM in the last 72 hours.   Intake/Output Summary (Last 24 hours) at 03/04/2021 1259 Last data filed at 03/04/2021 0900 Gross per 24 hour  Intake 720 ml  Output --  Net 720 ml     Pressure Injury 02/26/21 Coccyx Mid;Posterior Stage 2 -  Partial thickness loss of dermis presenting as a shallow open injury with a red, pink wound bed without slough. (Active)  02/26/21 1702  Location: Coccyx  Location Orientation: Mid;Posterior  Staging: Stage 2 -  Partial thickness loss of dermis presenting as a shallow open injury with a red, pink wound bed without slough.  Wound Description (Comments):   Present on Admission: Yes    Physical Exam: Vital Signs Blood pressure (!) 113/59, pulse 63, temperature 98.3 F (36.8 C), resp. rate 16, height 6' (1.829 m), weight 35.6 kg, SpO2 95 %.  Constitutional: No distress . Vital signs reviewed. HEENT: NCAT, EOMI, oral membranes moist Neck: supple Cardiovascular: RRR without murmur. No JVD    Respiratory/Chest: CTA Bilaterally without wheezes or rales. Normal effort    GI/Abdomen: BS +, non-tender, non-distended Ext: no clubbing, cyanosis, or edema Psych: flat, slow to cooperate and engage Skin: sacral wound, scattered bruises on limbs Neuro:  eyes closed a lot this am. oriented to person and hospital. Follows basic commands. Distracted. Moves all 4's. Seems to sense  pain. Musculoskeletal: no pain with ROM/palpation. Chronic jt changes at knees but no effusion or  pain with palpation or ROM   Assessment/Plan: 1. Functional deficits which require 3+ hours per day of interdisciplinary therapy in a comprehensive inpatient rehab setting. Physiatrist is providing close team supervision and 24 hour management of active medical problems listed below. Physiatrist and rehab team continue to assess barriers to discharge/monitor patient progress toward functional and medical goals  Care Tool:  Bathing    Body parts bathed by patient: Right arm, Left arm, Chest, Abdomen, Front perineal area, Right upper leg, Left upper leg, Right lower leg, Left lower leg, Face   Body parts bathed by helper: Buttocks     Bathing assist Assist Level: Minimal Assistance - Patient > 75%     Upper Body Dressing/Undressing Upper body dressing   What is the patient wearing?: Pull over shirt    Upper body assist Assist Level: Set up assist    Lower Body Dressing/Undressing Lower body dressing      What is the patient wearing?: Pants     Lower body assist Assist for lower body dressing: Contact Guard/Touching assist     Toileting Toileting Toileting Activity did not occur (Clothing management and hygiene only): N/A (no void or bm)  Toileting assist Assist for toileting: Minimal Assistance - Patient > 75%     Transfers Chair/bed transfer  Transfers assist     Chair/bed transfer assist level: Contact Guard/Touching assist Chair/bed transfer assistive device: Programmer, multimedia   Ambulation assist      Assist level: Contact Guard/Touching assist Assistive device: Walker-rolling Max distance: 30 ft   Walk 10 feet activity   Assist     Assist level: Minimal Assistance - Patient > 75% Assistive device: Walker-rolling   Walk 50 feet activity   Assist Walk 50 feet with 2 turns activity did not occur: Safety/medical concerns (decreased activity tolerance)         Walk 150 feet activity   Assist Walk 150 feet activity did not  occur: Safety/medical concerns         Walk 10 feet on uneven surface  activity   Assist     Assist level: Minimal Assistance - Patient > 75% Assistive device: Walker-rolling   Wheelchair     Assist Is the patient using a wheelchair?: Yes Type of Wheelchair: Manual    Wheelchair assist level: Minimal Assistance - Patient > 75% Max wheelchair distance: 55 ft    Wheelchair 50 feet with 2 turns activity    Assist        Assist Level: Minimal Assistance - Patient > 75%   Wheelchair 150 feet activity     Assist      Assist Level: Total Assistance - Patient < 25%   Blood pressure (!) 113/59, pulse 63, temperature 98.3 F (36.8 C), resp. rate 16, height 6' (1.829 m), weight 35.6 kg, SpO2 95 %.  Medical Problem List and Plan:   SAH/IPH due to hypertensive emergency- with impaired ADLs/mobility  -Continue CIR therapies including PT, OT, and SLP   Antithrombotics . DVT Prophylaxis/Anticoagulation: Mechanical: Sequential compression devices, below knee Bilateral lower extremities due to brain bleed 2. Pain Management/Headaches: Norco for HA as needed and tylenol prn-    -11/17began topamax 25mg  bid for h/a prophylaxis and voltaren gel for bilateral knee pain  11/18 some improvement--continue to follow 3. Mood/sleep: emotional support- and monitor if needs  neuropsychology.   -antipsychotics: none  - sleep chart  -continue scheduled trazodone at night 4. Neuropsych- the patient is not  capable of making decisions for himself.  5. Skin/Wounds- routine wound care.  6. Fluids/Electrolytes/Nutrition: eating next to nothing  -encourage PO  -cognitive component 7. HTN in setting of hypertensive emergency-   -scheduled coreg, entresto,  and aldactone  -prn catapres and hydralazine  -continue low dose scheduled hydralazine 10mg  tid  11/18 bp's under much better control 8. CHF with EF 30-35%- con't Entresto and monitor   Filed Weights   02/28/21 1047  03/04/21 0404 03/04/21 0408  Weight: 72.2 kg 35.5 kg 35.6 kg   Follow weights  -need them recorded 9. COPD- con't O2 and wean as tolerated-also continue his regimen of COPD meds- including Pulmicort, Albuterol prn, Brovana. And change as required.  10. Constipation- will order Sorbitol since it's been 1 week since last BM- and con't miralax prn and Colace.   11/17 moving bowels 11. UTI- resolved- s/p IV rocephin 12. Smoker- tobacco- abuse- counseling and Nicoderm 21 mg daily 14. Hx of prostate CA s/p XRT seeding 16. GERD- con't protonix and monitor/change as required 17. AAA/CAD/HLD- con't to monitor    LOS: 6 days A FACE TO FACE EVALUATION WAS PERFORMED  Meredith Staggers 03/04/2021, 12:59 PM

## 2021-03-04 NOTE — Plan of Care (Signed)
  Problem: Consults Goal: RH GENERAL PATIENT EDUCATION Description: See Patient Education module for education specifics. Outcome: Progressing   Problem: RH BOWEL ELIMINATION Goal: RH STG MANAGE BOWEL WITH ASSISTANCE Description: STG Manage Bowel with mod I Assistance. Outcome: Progressing Goal: RH STG MANAGE BOWEL W/MEDICATION W/ASSISTANCE Description: STG Manage Bowel with Medication with mod I Assistance. Outcome: Progressing   Problem: RH BLADDER ELIMINATION Goal: RH STG MANAGE BLADDER WITH ASSISTANCE Description: STG Manage Bladder With no Assistance; and following preventative measures for UTI using handouts and educational resources independently Outcome: Progressing   Problem: RH SAFETY Goal: RH STG ADHERE TO SAFETY PRECAUTIONS W/ASSISTANCE/DEVICE Description: STG Adhere to Safety Precautions With Assistance/Device. Outcome: Progressing   Problem: RH PAIN MANAGEMENT Goal: RH STG PAIN MANAGED AT OR BELOW PT'S PAIN GOAL Outcome: Progressing   Problem: RH KNOWLEDGE DEFICIT GENERAL Goal: RH STG INCREASE KNOWLEDGE OF SELF CARE AFTER HOSPITALIZATION Description: Patient will be able to manage secondary risks with medications and dietary modifications using handouts and educational resources independently Outcome: Progressing   Problem: RH KNOWLEDGE DEFICIT Goal: RH STG INCREASE KNOWLEDGE OF HYPERTENSION Description: Patient will be able to manage HTN with medications and dietary modifications using handouts and educational resources independently Outcome: Progressing Goal: RH STG INCREASE KNOWLEGDE OF HYPERLIPIDEMIA Description: Patient will be able to manage HLD with medications and dietary modifications using handouts and educational resources independently Outcome: Progressing   Problem: Consults Goal: RH STROKE PATIENT EDUCATION Description: See Patient Education module for education specifics  Outcome: Progressing

## 2021-03-04 NOTE — Progress Notes (Signed)
Patient weight chart is reading 50% weight loss since last week & patient appears more fatigued than yesterday and has to make several attempts to stand. Dietary consult may be needed.

## 2021-03-04 NOTE — Progress Notes (Signed)
Physical Therapy Session Note  Patient Details  Name: [name] MRN: [id number] Date of Birth: 1946/02/25  Today's Date: 03/04/2021 PT Individual Time: 0905-1000 PT Individual Time Calculation (min): 55 min   Short Term Goals: Week 1:  PT Short Term Goal 1 (Week 1): Patient will perform basic transfers with CGA >75% of the time using LRAD. PT Short Term Goal 2 (Week 1): Patient will ambulate >100 feet with CGA using LRAD. PT Short Term Goal 3 (Week 1): Patient will initiate stair training for increased endurance/strength with functional mobility. PT Short Term Goal 4 (Week 1): Patient will improve his Berg Score by >8 points to meet the MCID.  Skilled Therapeutic Interventions/Progress Updates:     Pt received supine in bed asleep. Easily roused and agreeable to therapy. Reports pain in both knees, chronic in nature, and approximately the same pain as he is accustomed to. PT provides mobility and rest breaks to manage pain. Supine to sit slowly with bed features and cues for positioning at EOB. Sit to stand with RW and minA, with cues for hand placement and anterior weight shift. Pt ambulates x10' to toilet and performs toilet transfer with minA for RW management and assist with eccentric lowering onto toilet. Following, pt ambulates to sink and washes hands in standing with PT providing CGA and cues for posture to improve balance. Pt transfers to [facility] with CGA and cues for positioning, with improved safety relative to previous sessions. WC transport to gym for time management. Pt performs sit to stand and step up onto scale with minA HHA and cues for foot placement and posture. MinA for step down and back to WC.  Pt performs Nustep for strength and endurance training as well as reciprocal coordination. PT provides frequent cues to maintain on task due to pt becoming lethargic and slowing movement. PT also cues for large amplitude movements as pt tends to move with smaller range movement. Pt  completes x18:00 at workload of 4 with average steps per minute ~40.   Stand pivot from nustep>WC>bed with CGA. Left supine with alarm intact and all needs within reach.  Therapy Documentation Precautions:  Precautions Precautions: Fall Precaution Comments: Labile BP, monitor with mobility Restrictions Weight Bearing Restrictions: No    Therapy/Group: Individual Therapy  Breck Coons, PT, DPT 03/04/2021, 9:49 AM

## 2021-03-05 MED ORDER — ENSURE ENLIVE PO LIQD
237.0000 mL | Freq: Two times a day (BID) | ORAL | Status: DC
Start: 2021-03-06 — End: 2021-03-16
  Administered 2021-03-06 – 2021-03-15 (×16): 237 mL via ORAL

## 2021-03-05 NOTE — Plan of Care (Signed)
  Problem: Consults Goal: RH GENERAL PATIENT EDUCATION Description: See Patient Education module for education specifics. Outcome: Progressing   Problem: RH BOWEL ELIMINATION Goal: RH STG MANAGE BOWEL WITH ASSISTANCE Description: STG Manage Bowel with mod I Assistance. Outcome: Progressing Goal: RH STG MANAGE BOWEL W/MEDICATION W/ASSISTANCE Description: STG Manage Bowel with Medication with mod I Assistance. Outcome: Progressing   Problem: RH BLADDER ELIMINATION Goal: RH STG MANAGE BLADDER WITH ASSISTANCE Description: STG Manage Bladder With no Assistance; and following preventative measures for UTI using handouts and educational resources independently Outcome: Progressing   Problem: RH SAFETY Goal: RH STG ADHERE TO SAFETY PRECAUTIONS W/ASSISTANCE/DEVICE Description: STG Adhere to Safety Precautions With Assistance/Device. Outcome: Not Progressing   Problem: RH PAIN MANAGEMENT Goal: RH STG PAIN MANAGED AT OR BELOW PT'S PAIN GOAL Outcome: Progressing   Problem: RH KNOWLEDGE DEFICIT GENERAL Goal: RH STG INCREASE KNOWLEDGE OF SELF CARE AFTER HOSPITALIZATION Description: Patient will be able to manage secondary risks with medications and dietary modifications using handouts and educational resources independently Outcome: Progressing   Problem: RH KNOWLEDGE DEFICIT Goal: RH STG INCREASE KNOWLEDGE OF HYPERTENSION Description: Patient will be able to manage HTN with medications and dietary modifications using handouts and educational resources independently Outcome: Progressing Goal: RH STG INCREASE KNOWLEGDE OF HYPERLIPIDEMIA Description: Patient will be able to manage HLD with medications and dietary modifications using handouts and educational resources independently Outcome: Progressing   Problem: Consults Goal: RH STROKE PATIENT EDUCATION Description: See Patient Education module for education specifics  Outcome: Progressing

## 2021-03-05 NOTE — Progress Notes (Signed)
Speech Language Pathology Daily Session Note  Patient Details  Name: NICKLAS MCSWEENEY MRN: [id number] Date of Birth: 02/08/1946  Today's Date: 03/05/2021 SLP Individual Time: 1100-1155 SLP Individual Time Calculation (min): 55 min  Short Term Goals: Week 1: SLP Short Term Goal 1 (Week 1): Patient will demonstrate complex problem solving for functional tasks with Min verbal cues. SLP Short Term Goal 2 (Week 1): Patient will recall new, daily information with supervision level verbal cues. SLP Short Term Goal 3 (Week 1): Patient will self-monitor and correct errors during functional tasks with Min verbal cues. SLP Short Term Goal 4 (Week 1): Patient will demonstrate sustained attention to functional tasks for 45 minutes with Min verbal cues for redirection.  Skilled Therapeutic Interventions: Skilled treatment session focused on cognitive goals. Upon arrival, patient was ambulating from the bathroom with the RN. Patient was transferred to the wheelchair to maximize attention and arousal. Patient's son also arrived at beginning of session and remained throughout. Patient requested coffee and required extra time for set-up. Patient was not wearing his glasses and was picking up sheets of paper as he thought they were sugar packets. Patient reported he thought he was wearing his glasses when questioned. Patient given a comb to brush his hair and brushed his hair straight down to his face. His son reported that is not his normal hair style and patient required assistance combing his hair appropriately. SLP facilitated session with a basic reading task in which he had to identify information from a medication label. Patient required total A to locate information despite choices and turned the paper upside down in attempts to facilitate accuracy. Patient with minimal awareness of errors and difficulty of task. Patient left upright in wheelchair with son present. Continue with current plan of care.       Pain Pain Assessment Pain Scale: 0-10 Pain Score: 1   Therapy/Group: Individual Therapy  Taleeyah Bora 03/05/2021, 12:35 PM

## 2021-03-05 NOTE — Progress Notes (Signed)
Spoke to MD on-call concerning patient low and ortho B/P. MD verbal order to d/c standing order of hydralazine and keep PRN order. MAR updated. Sanda Linger, LPN

## 2021-03-06 DIAGNOSIS — I2510 Atherosclerotic heart disease of native coronary artery without angina pectoris: Secondary | ICD-10-CM | POA: Diagnosis not present

## 2021-03-06 DIAGNOSIS — K5900 Constipation, unspecified: Secondary | ICD-10-CM | POA: Diagnosis not present

## 2021-03-06 DIAGNOSIS — I619 Nontraumatic intracerebral hemorrhage, unspecified: Secondary | ICD-10-CM | POA: Diagnosis not present

## 2021-03-06 DIAGNOSIS — I110 Hypertensive heart disease with heart failure: Secondary | ICD-10-CM | POA: Diagnosis not present

## 2021-03-06 DIAGNOSIS — G441 Vascular headache, not elsewhere classified: Secondary | ICD-10-CM | POA: Diagnosis not present

## 2021-03-06 DIAGNOSIS — J432 Centrilobular emphysema: Secondary | ICD-10-CM | POA: Diagnosis not present

## 2021-03-06 DIAGNOSIS — R2689 Other abnormalities of gait and mobility: Secondary | ICD-10-CM | POA: Diagnosis not present

## 2021-03-06 DIAGNOSIS — I252 Old myocardial infarction: Secondary | ICD-10-CM | POA: Diagnosis not present

## 2021-03-06 DIAGNOSIS — I69118 Other symptoms and signs involving cognitive functions following nontraumatic intracerebral hemorrhage: Secondary | ICD-10-CM | POA: Diagnosis not present

## 2021-03-06 DIAGNOSIS — I5023 Acute on chronic systolic (congestive) heart failure: Secondary | ICD-10-CM

## 2021-03-06 DIAGNOSIS — K219 Gastro-esophageal reflux disease without esophagitis: Secondary | ICD-10-CM | POA: Diagnosis not present

## 2021-03-06 DIAGNOSIS — L89152 Pressure ulcer of sacral region, stage 2: Secondary | ICD-10-CM | POA: Diagnosis not present

## 2021-03-06 DIAGNOSIS — I69198 Other sequelae of nontraumatic intracerebral hemorrhage: Secondary | ICD-10-CM | POA: Diagnosis not present

## 2021-03-06 DIAGNOSIS — I5022 Chronic systolic (congestive) heart failure: Secondary | ICD-10-CM

## 2021-03-06 DIAGNOSIS — I951 Orthostatic hypotension: Secondary | ICD-10-CM

## 2021-03-06 DIAGNOSIS — I5042 Chronic combined systolic (congestive) and diastolic (congestive) heart failure: Secondary | ICD-10-CM

## 2021-03-06 DIAGNOSIS — I739 Peripheral vascular disease, unspecified: Secondary | ICD-10-CM | POA: Diagnosis not present

## 2021-03-06 MED ORDER — SODIUM CHLORIDE 0.9 % IV SOLN: Freq: Once | INTRAVENOUS | Status: DC

## 2021-03-06 MED ORDER — SODIUM CHLORIDE 0.9 % IV SOLN: Freq: Once | INTRAVENOUS | Status: AC

## 2021-03-06 MED ORDER — ACETAMINOPHEN 325 MG PO TABS
650.0000 mg | ORAL_TABLET | Freq: Four times a day (QID) | ORAL | Status: DC
  Administered 2021-03-06 – 2021-03-09 (×12): 650 mg via ORAL
  Filled 2021-03-06 (×11): qty 2

## 2021-03-06 NOTE — Progress Notes (Signed)
Speech Language Pathology Daily Session Note  Patient Details  Name: DEAKIN LACEK MRN: [id number] Date of Birth: Apr 25, 1945  Today's Date: 03/06/2021 SLP Individual Time: 1020-1100 SLP Individual Time Calculation (min): 40 min  Short Term Goals: Week 1: SLP Short Term Goal 1 (Week 1): Patient will demonstrate complex problem solving for functional tasks with Min verbal cues. SLP Short Term Goal 2 (Week 1): Patient will recall new, daily information with supervision level verbal cues. SLP Short Term Goal 3 (Week 1): Patient will self-monitor and correct errors during functional tasks with Min verbal cues. SLP Short Term Goal 4 (Week 1): Patient will demonstrate sustained attention to functional tasks for 45 minutes with Min verbal cues for redirection.  Skilled Therapeutic Interventions:  Pt was seen for skilled ST targeting cognitive goals.  Upon arrival, staff was obtaining orthostatic vitals due to episode of orthostasis with PT earlier this morning. Pt's blood pressure dropped in standing but he was able to tolerate sitting at the edge of the bed for the majority of today's session.  SLP facilitated the session with a novel card game targeting sustained attention and problem solving goals.  Pt was able to sustain his attention to task for ~3-5 minute intervals before requiring min-mod verbal cues for redirection.  Pt also benefited from intermittent min assist verbal cues to facilitate recall of task rules and procedures.  When pt was maximally attentive, he could problem solve within task with min assist-supervision verbal cues; however, his performance fluctuated throughout session due to fatigue and headache.  As a result, pt was returned to bed at the end of today's session and pt was left in bed with bed alarm set and call bell within reach.  Continue per current plan of care.    Pain Pain Assessment Pain Scale: 0-10 Pain Score: Asleep  Therapy/Group: Individual Therapy  Rhegan Trunnell,  Selinda Orion 03/06/2021, 10:47 AM

## 2021-03-06 NOTE — Progress Notes (Signed)
Physical Therapy Session Note  Patient Details  Name: [name] MRN: [id number] Date of Birth: 05/21/1945  Today's Date: 03/06/2021 PT Individual Time: 0900-0912 PT Individual Time Calculation (min): 12 min  and Today's Date: 03/06/2021 PT Missed Time: 24 Minutes Missed Time Reason: Patient ill (Comment) (minimally responsive, unable to rouse)  Short Term Goals: Week 1:  PT Short Term Goal 1 (Week 1): Patient will perform basic transfers with CGA >75% of the time using LRAD. PT Short Term Goal 2 (Week 1): Patient will ambulate >100 feet with CGA using LRAD. PT Short Term Goal 3 (Week 1): Patient will initiate stair training for increased endurance/strength with functional mobility. PT Short Term Goal 4 (Week 1): Patient will improve his Berg Score by >8 points to meet the MCID.  Skilled Therapeutic Interventions/Progress Updates:    Pt received in bed as hand off from previous treating PT. Pt had bout of orthostasis right before arrival, previous therapist donned ted hose and requested this therapist take ortho static vitals. Pt sleepy but responsive at this time. Therapist took vitals as: BP=133/71 (91), HR=56 bpm.  Therapist was then unable to rouse pt to come to sitting. Pt was minimally responsive to stimuli but did not open eyes and was unable to answer questions. Nursing and MD made aware, and nursing took over care to provide EKG and IV fluids. Will follow up later in the day as schedule allows. Pt missed 48 min of scheduled therapy d/t unresponsiveness.   Therapy Documentation Precautions:  Precautions Precautions: Fall Precaution Comments: Labile BP, monitor with mobility Restrictions Weight Bearing Restrictions: No General: PT Amount of Missed Time (min): 48 Minutes PT Missed Treatment Reason: Patient ill (Comment) (minimally responsive, unable to rouse)    Therapy/Group: Individual Therapy  Mickel Fuchs 03/06/2021, 9:20 AM

## 2021-03-06 NOTE — Progress Notes (Signed)
PROGRESS NOTE   Subjective/Complaints: Patient seen sitting up in bed this morning eating breakfast.  He states he slept well overnight, confirmed with sleep chart.  Complains of right periorbital pain since his fall.  Yesterday, family related to therapies they would like to speak to me.  Discussed with therapies, attempted to call family, however no answer, left voicemail.  This morning, later called by nursing noting orthostatic symptoms with therapies.  ROS: Denies CP, SOB, N/V/D  Objective:   No results found. No results for input(s): WBC, HGB, HCT, PLT in the last 72 hours.  No results for input(s): NA, K, CL, CO2, GLUCOSE, BUN, CREATININE, CALCIUM in the last 72 hours.   Intake/Output Summary (Last 24 hours) at 03/06/2021 1962 Last data filed at 03/06/2021 0800 Gross per 24 hour  Intake 438 ml  Output --  Net 438 ml      Pressure Injury 02/26/21 Coccyx Mid;Posterior Stage 2 -  Partial thickness loss of dermis presenting as a shallow open injury with a red, pink wound bed without slough. (Active)  02/26/21 1702  Location: Coccyx  Location Orientation: Mid;Posterior  Staging: Stage 2 -  Partial thickness loss of dermis presenting as a shallow open injury with a red, pink wound bed without slough.  Wound Description (Comments):   Present on Admission: Yes    Physical Exam: Vital Signs Blood pressure (!) 167/79, pulse (!) 48, temperature 97.6 F (36.4 C), temperature source Oral, resp. rate 18, height 6' (1.829 m), weight 68.6 kg, SpO2 99 %. Constitutional: No distress . Vital signs reviewed. HENT: Normocephalic.  Atraumatic. Eyes: EOMI. No discharge. Cardiovascular: No JVD.  Bradycardia. Respiratory: Normal effort.  No stridor.  Bilateral clear to auscultation. GI: Non-distended.  BS +. Skin: Warm and dry.  Sacral wound not examined today Psych: Flat.  Normal behavior. Musc: No edema in extremities.  No  tenderness in extremities. Neuro: Alert and oriented, except for date of month Moving all extremities  Assessment/Plan: 1. Functional deficits which require 3+ hours per day of interdisciplinary therapy in a comprehensive inpatient rehab setting. Physiatrist is providing close team supervision and 24 hour management of active medical problems listed below. Physiatrist and rehab team continue to assess barriers to discharge/monitor patient progress toward functional and medical goals  Care Tool:  Bathing    Body parts bathed by patient: Right arm, Left arm, Chest, Abdomen, Front perineal area, Right upper leg, Left upper leg, Right lower leg, Left lower leg, Face   Body parts bathed by helper: Buttocks     Bathing assist Assist Level: Minimal Assistance - Patient > 75%     Upper Body Dressing/Undressing Upper body dressing   What is the patient wearing?: Pull over shirt    Upper body assist Assist Level: Set up assist    Lower Body Dressing/Undressing Lower body dressing      What is the patient wearing?: Pants     Lower body assist Assist for lower body dressing: Contact Guard/Touching assist     Toileting Toileting Toileting Activity did not occur (Clothing management and hygiene only): N/A (no void or bm)  Toileting assist Assist for toileting: Minimal Assistance - Patient > 75%  Transfers Chair/bed transfer  Transfers assist     Chair/bed transfer assist level: Contact Guard/Touching assist Chair/bed transfer assistive device: Programmer, multimedia   Ambulation assist      Assist level: Contact Guard/Touching assist Assistive device: Walker-rolling Max distance: 30 ft   Walk 10 feet activity   Assist     Assist level: Minimal Assistance - Patient > 75% Assistive device: Walker-rolling   Walk 50 feet activity   Assist Walk 50 feet with 2 turns activity did not occur: Safety/medical concerns (decreased activity tolerance)          Walk 150 feet activity   Assist Walk 150 feet activity did not occur: Safety/medical concerns         Walk 10 feet on uneven surface  activity   Assist     Assist level: Minimal Assistance - Patient > 75% Assistive device: Walker-rolling   Wheelchair     Assist Is the patient using a wheelchair?: Yes Type of Wheelchair: Manual    Wheelchair assist level: Minimal Assistance - Patient > 75% Max wheelchair distance: 55 ft    Wheelchair 50 feet with 2 turns activity    Assist        Assist Level: Minimal Assistance - Patient > 75%   Wheelchair 150 feet activity     Assist      Assist Level: Total Assistance - Patient < 25%   Blood pressure (!) 167/79, pulse (!) 48, temperature 97.6 F (36.4 C), temperature source Oral, resp. rate 18, height 6' (1.829 m), weight 68.6 kg, SpO2 99 %.  Medical Problem List and Plan:   SAH/IPH due to hypertensive emergency- with impaired ADLs/mobility  Continue CIR Antithrombotics . DVT Prophylaxis/Anticoagulation: Mechanical: Sequential compression devices, below knee Bilateral lower extremities due to brain bleed 2. Pain Management/Headaches: Norco for HA as needed and tylenol prn-    -11/17began topamax 25mg  bid for h/a prophylaxis and voltaren gel for bilateral knee pain  Controlled with meds on 11/20 3. Mood/sleep: emotional support- and monitor if needs  neuropsychology.   -antipsychotics: none  - sleep chart  -continue scheduled trazodone at night  Telemetry sitter for safety 4. Neuropsych- the patient is not  capable of making decisions for himself.  5. Skin/Wounds- routine wound care.  6. Fluids/Electrolytes/Nutrition: eating next to nothing  -encourage PO  -cognitive component 7. HTN in setting of hypertensive emergency with orthostasis-   -entresto,  and aldactone  Coreg DC'd given heart rate  Scheduled hydralazine DC'd given blood pressure drops  -prn catapres and hydralazine  Remains labile  on 11/20  IVF started x6 hours on 11/20  ECG ordered 8. CHF with EF 40-45%- con't Entresto and monitor   Filed Weights   03/04/21 0408 03/04/21 1500 03/06/21 0511  Weight: 35.6 kg 71.3 kg 68.6 kg   Unreliable on 11/20 9. COPD- con't O2 and wean as tolerated-also continue his regimen of COPD meds- including Pulmicort, Albuterol prn, Brovana. And change as required.  10. Constipation-   11/17 moving bowels 11. UTI- resolved- s/p IV rocephin 12. Smoker- tobacco- abuse- counseling and Nicoderm 21 mg daily 14. Hx of prostate CA s/p XRT seeding 16. GERD- con't protonix and monitor/change as required 17. AAA/CAD/HLD- con't to monitor   LOS: 8 days A FACE TO FACE EVALUATION WAS PERFORMED  Alyric Parkin Lorie Phenix 03/06/2021, 9:17 AM

## 2021-03-06 NOTE — Progress Notes (Signed)
Nurse informed that patient SBP has went from 111-88. Patient became very lethargic. Patient placed in reverse trendelenberg and teds place. MD notified. New order for EKG and IV fluids. Verbal orders placed. IV placed. Nurse performed EKG and fluid started. MD notified with EKG results. New order to d/c Trazodone and Topamax. New order to start Acetaminophen 650 mg every six hours for headaches. Patient is more alert and able to do therapy at the bedside without feeling dizzy or sleepy.  Sanda Linger, LPN

## 2021-03-06 NOTE — Plan of Care (Signed)
  Problem: Consults Goal: RH GENERAL PATIENT EDUCATION Description: See Patient Education module for education specifics. Outcome: Progressing   Problem: RH BOWEL ELIMINATION Goal: RH STG MANAGE BOWEL WITH ASSISTANCE Description: STG Manage Bowel with mod I Assistance. Outcome: Progressing Goal: RH STG MANAGE BOWEL W/MEDICATION W/ASSISTANCE Description: STG Manage Bowel with Medication with mod I Assistance. Outcome: Progressing   Problem: RH BOWEL ELIMINATION Goal: RH STG MANAGE BOWEL WITH ASSISTANCE Description: STG Manage Bowel with mod I Assistance. Outcome: Progressing Goal: RH STG MANAGE BOWEL W/MEDICATION W/ASSISTANCE Description: STG Manage Bowel with Medication with mod I Assistance. Outcome: Progressing   Problem: RH BLADDER ELIMINATION Goal: RH STG MANAGE BLADDER WITH ASSISTANCE Description: STG Manage Bladder With no Assistance; and following preventative measures for UTI using handouts and educational resources independently Outcome: Progressing   Problem: RH BLADDER ELIMINATION Goal: RH STG MANAGE BLADDER WITH ASSISTANCE Description: STG Manage Bladder With no Assistance; and following preventative measures for UTI using handouts and educational resources independently Outcome: Progressing   Problem: RH SAFETY Goal: RH STG ADHERE TO SAFETY PRECAUTIONS W/ASSISTANCE/DEVICE Description: STG Adhere to Safety Precautions With Assistance/Device. Outcome: Progressing   Problem: RH SAFETY Goal: RH STG ADHERE TO SAFETY PRECAUTIONS W/ASSISTANCE/DEVICE Description: STG Adhere to Safety Precautions With Assistance/Device. Outcome: Progressing   Problem: RH PAIN MANAGEMENT Goal: RH STG PAIN MANAGED AT OR BELOW PT'S PAIN GOAL Outcome: Progressing   Problem: RH KNOWLEDGE DEFICIT GENERAL Goal: RH STG INCREASE KNOWLEDGE OF SELF CARE AFTER HOSPITALIZATION Description: Patient will be able to manage secondary risks with medications and dietary modifications using handouts  and educational resources independently Outcome: Progressing   Problem: RH KNOWLEDGE DEFICIT Goal: RH STG INCREASE KNOWLEDGE OF HYPERTENSION Description: Patient will be able to manage HTN with medications and dietary modifications using handouts and educational resources independently Outcome: Progressing Goal: RH STG INCREASE KNOWLEGDE OF HYPERLIPIDEMIA Description: Patient will be able to manage HLD with medications and dietary modifications using handouts and educational resources independently Outcome: Progressing   Problem: Consults Goal: RH STROKE PATIENT EDUCATION Description: See Patient Education module for education specifics  Outcome: Progressing

## 2021-03-06 NOTE — Progress Notes (Signed)
Physical Therapy Session Note  Patient Details  Name: [name] MRN: [id number] Date of Birth: 09/26/1945  Today's Date: 03/06/2021 PT Individual Time: 6196-9409 PT Individual Time Calculation (min): 23 min   Short Term Goals: Week 1:  PT Short Term Goal 1 (Week 1): Patient will perform basic transfers with CGA >75% of the time using LRAD. PT Short Term Goal 2 (Week 1): Patient will ambulate >100 feet with CGA using LRAD. PT Short Term Goal 3 (Week 1): Patient will initiate stair training for increased endurance/strength with functional mobility. PT Short Term Goal 4 (Week 1): Patient will improve his Berg Score by >8 points to meet the MCID.  Skilled Therapeutic Interventions/Progress Updates: Pt presented in bed sleeping and aroused with increased stimulation. Pt denies pain but states general feeling of malaise. Pt noted to keep eyes closed and PTA required repeated requests when asking questions. BP checked prior to mobility 128/62 (83) HR 57. With increased time, pt performed supine to sit at EOB with CGA and use of bed features. BP checked in sitting 110/66 (79) HR 61 with no c/o OH. Pt then performed Sit to stand from EOB with CGA and BP checked again 88/54 (65) HR 67 with pt stating feeling "a little worse". Pt returned to sitting and participated in seated therex for general endurance with no additional complaints of symptoms. Pt performed LAQ, seated marches, hip abd/add, manually resisted hamstring curls to fatigue bilaterally (12-15 ea). Pt returned to supine with supervision and use of bed features and left with bed alarm on, call bell within reach and needs met.      Therapy Documentation Precautions:  Precautions Precautions: Fall Precaution Comments: Labile BP, monitor with mobility Restrictions Weight Bearing Restrictions: No General:   Vital Signs: Therapy Vitals Temp: 98 F (36.7 C) Temp Source: Oral Pulse Rate: (!) 57 Resp: 17 BP: 140/63 Patient Position  (if appropriate): Lying Oxygen Therapy SpO2: 97 % O2 Device: Room Air    Therapy/Group: Individual Therapy  Lorren Splawn Ryelle Ruvalcaba, PTA  03/06/2021, 3:45 PM

## 2021-03-06 NOTE — Progress Notes (Signed)
MD notified concerning patent low HR. MD ordered to hold Coreg dose this morning. Sanda Linger, LPN

## 2021-03-06 NOTE — Progress Notes (Signed)
Physical Therapy Session Note  Patient Details  Name: [name] MRN: [id number] Date of Birth: 22-Jul-1945  Today's Date: 03/06/2021 PT Individual Time: 0825-0900 PT Individual Time Calculation (min): 35 min  Short Term Goals: Week 1:  PT Short Term Goal 1 (Week 1): Patient will perform basic transfers with CGA >75% of the time using LRAD. PT Short Term Goal 2 (Week 1): Patient will ambulate >100 feet with CGA using LRAD. PT Short Term Goal 3 (Week 1): Patient will initiate stair training for increased endurance/strength with functional mobility. PT Short Term Goal 4 (Week 1): Patient will improve his Berg Score by >8 points to meet the MCID.  Skilled Therapeutic Interventions/Progress Updates:     Patient sitting EOB upon PT arrival. Patient alert and agreeable to PT session, reported urgent need to void. Patient denied pain during session, reports no headache this morning with patient expressing relief.   Therapeutic Activity: Bed Mobility: Patient performed sit to supine with supervision. Provided verbal cues for initiation. Transfers: Patient performed sit to/from stand from the bed x2 and from the toilet x1 with close supervision using RW. Provided verbal cues for safe hand placement and reaching back to control descent. Patient performed an ambulatory transfer to/from the bathroom with close supervision, denied symptoms during mobility.  Assessed vitals due to orththostasis yesterday: Orthostatic Vitals: Sitting: BP 111/69, HR 63 Standing: BP 81/54, HR 79  Patient returned to supine, as above, and immediately closed his eyes with slow response initially then verbally responsive with eyes closed, patient denied symptoms. PT donned B thigh high TED hose per discussion with LPN. Olivia, PT, arrived for back-to back session. Handed off for taking orthostatic vitals with thigh high TEDs donned. LPN and charge nurse informed of patient response and vitals during therapy  session.  Therapy Documentation Precautions:  Precautions Precautions: Fall Precaution Comments: Labile BP, monitor with mobility Restrictions Weight Bearing Restrictions: No   Therapy/Group: Individual Therapy  Kendan Cornforth L Oluwateniola Leitch PT, DPT  03/06/2021, 12:01 PM

## 2021-03-06 NOTE — Progress Notes (Signed)
Occupational Therapy Session Note  Patient Details  Name: [name] MRN: [id number] Date of Birth: 1946-01-31  Today's Date: 03/06/2021 OT Individual Time: 1105-1200 OT Individual Time Calculation (min): 55 min    Short Term Goals: Week 1:  OT Short Term Goal 1 (Week 1): Patient will complete toilet transfer with Min guard. OT Short Term Goal 2 (Week 1): Patient will complete shower transfer with Min guard. OT Short Term Goal 3 (Week 1): Patient will don LB clothing with Min gaurd.  Skilled Therapeutic Interventions/Progress Updates:    Pt greeted asleep in bed and difficult to wake. Pt would respond verbally to OT , then fall asleep again. OT turned on bright lights and provided verbal and tactile cues to come to sitting EOB. After much more than reasonable amount of time, pt eventually got to sitting EOB with min A. Pt already with thigh high TEDs on and no reports of dizziness in sitting. Pt needed verbal cues for hand placement with sit<>stand, but completed with CGA. Pt ambulated to the bathroom RW and CGA. More cues for RW position when turning to sit on commode. PT with successful void of bladder and close supervision when standing to pull up pants. Pt then ambulated to therapy gym with RW and CGA. Pt completed 10 mins on level 3 on NuStep for strength/endurance. OT played pt preferred country music during there-ex. Pt then ambulated 30 feet in hallway with RW and CGA with verbal cues for proximity to RW and gait speed. Pt returned to bed at end of session with supervision. Pt left semi-reclined in bed with bed alarm on, call bell in reach, and needs met.  Therapy Documentation Precautions:  Precautions Precautions: Fall Precaution Comments: Labile BP, monitor with mobility Restrictions Weight Bearing Restrictions: No Pain: Pain Assessment Pain Scale: 0-10 Pain Score:5  Pain Location: Head Pain Descriptors / Indicators: Headache Pain Intervention(s):Repositioned,  rest    Therapy/Group: Individual Therapy  Valma Cava 03/06/2021, 11:30 AM

## 2021-03-07 DIAGNOSIS — I619 Nontraumatic intracerebral hemorrhage, unspecified: Secondary | ICD-10-CM | POA: Diagnosis not present

## 2021-03-07 LAB — BASIC METABOLIC PANEL
Anion gap: 8 (ref 5–15)
BUN: 19 mg/dL (ref 8–23)
CO2: 24 mmol/L (ref 22–32)
Calcium: 8.8 mg/dL — ABNORMAL LOW (ref 8.9–10.3)
Chloride: 105 mmol/L (ref 98–111)
Creatinine, Ser: 1.09 mg/dL (ref 0.61–1.24)
GFR, Estimated: >60 mL/min (ref >60)
Glucose, Bld: 97 mg/dL (ref 70–99)
Potassium: 3.9 mmol/L (ref 3.5–5.1)
Sodium: 137 mmol/L (ref 135–145)

## 2021-03-07 LAB — CBC WITH DIFFERENTIAL/PLATELET
Abs Immature Granulocytes: 0.03 10*3/uL (ref 0.00–0.07)
Basophils Absolute: 0 10*3/uL (ref 0.0–0.1)
Basophils Relative: 0 %
Eosinophils Absolute: 0.3 10*3/uL (ref 0.0–0.5)
Eosinophils Relative: 4 %
HCT: 39.8 % (ref 39.0–52.0)
Hemoglobin: 13.6 g/dL (ref 13.0–17.0)
Immature Granulocytes: 0 %
Lymphocytes Relative: 20 %
Lymphs Abs: 1.4 10*3/uL (ref 0.7–4.0)
MCH: 32.4 pg (ref 26.0–34.0)
MCHC: 34.2 g/dL (ref 30.0–36.0)
MCV: 94.8 fL (ref 80.0–100.0)
Monocytes Absolute: 0.6 10*3/uL (ref 0.1–1.0)
Monocytes Relative: 9 %
Neutro Abs: 4.6 10*3/uL (ref 1.7–7.7)
Neutrophils Relative %: 67 %
Platelets: 188 10*3/uL (ref 150–400)
RBC: 4.2 MIL/uL — ABNORMAL LOW (ref 4.22–5.81)
RDW: 12.6 % (ref 11.5–15.5)
WBC: 6.9 10*3/uL (ref 4.0–10.5)
nRBC: 0 % (ref 0.0–0.2)

## 2021-03-07 NOTE — Progress Notes (Signed)
Speech Language Pathology Daily Session Note  Patient Details  Name: [name] MRN: [id number] Date of Birth: January 06, 1946  Today's Date: 03/07/2021 SLP Individual Time: 1000-1045 SLP Individual Time Calculation (min): 45 min  Short Term Goals: Week 1: SLP Short Term Goal 1 (Week 1): Patient will demonstrate complex problem solving for functional tasks with Min verbal cues. SLP Short Term Goal 2 (Week 1): Patient will recall new, daily information with supervision level verbal cues. SLP Short Term Goal 3 (Week 1): Patient will self-monitor and correct errors during functional tasks with Min verbal cues. SLP Short Term Goal 4 (Week 1): Patient will demonstrate sustained attention to functional tasks for 45 minutes with Min verbal cues for redirection.  Skilled Therapeutic Interventions: Skilled treatment session focused on cognitive goals. Upon arrival, patient was awake in the wheelchair. SLP facilitated session by providing Min A verbal cues for problem solving during a basic medication management task. Patient completed task with 85% accuracy. Patient then required overall Min-Mod A question cues to verbally sequence steps to placing his wife on and off peritoneal dialysis. Patient transferred back to bed at end of session and left with alarm on and all needs within reach. Continue with current plan of care.      Pain No reports of pain throughout session   Therapy/Group: Individual Therapy  Maiko Salais, [place] 03/07/2021, 3:45 PM

## 2021-03-07 NOTE — Progress Notes (Signed)
Occupational Therapy Weekly Progress Note  Patient Details  Name: ASBERRY LASCOLA MRN: [id number] Date of Birth: 1945-09-27  Beginning of progress report period: February 27, 2021 End of progress report period: March 07, 2021  Today's Date: 03/07/2021 OT Individual Time: 3785-8850 OT Individual Time Calculation (min): 59 min    Patient has met 3 of 3 short term goals.  Patient is making slow, but steady progress towards OT goals. Pt has been lethargic and difficult to wake up at times. He has also had some orthostatic hypotension that has also been limiting. Despite these barriers, pt is at an overall min/CGA level for functional ambulation w/ RW and BADL tasks. Pt continues to demonstrate decreased safety awareness and initiation within BADL Tasks. Continue current POC.   Patient continues to demonstrate the following deficits: muscle weakness, decreased cardiorespiratoy endurance, decreased initiation, decreased attention, decreased awareness, decreased problem solving, decreased safety awareness, and delayed processing, and decreased sitting balance, decreased standing balance, decreased postural control, and decreased balance strategies and therefore will continue to benefit from skilled OT intervention to enhance overall performance with BADL.  Patient progressing toward long term goals..  Continue plan of care.  OT Short Term Goals Week 1:  OT Short Term Goal 1 (Week 1): Patient will complete toilet transfer with Min guard. OT Short Term Goal 1 - Progress (Week 1): Met OT Short Term Goal 2 (Week 1): Patient will complete shower transfer with Min guard. OT Short Term Goal 2 - Progress (Week 1): Met OT Short Term Goal 3 (Week 1): Patient will don LB clothing with Min gaurd. OT Short Term Goal 3 - Progress (Week 1): Met Week 2:  OT Short Term Goal 1 (Week 2): Patient will initiate bed mobility with min cues OT Short Term Goal 2 (Week 2): Patient will maintain standing balance with CGA  within BADL task OT Short Term Goal 3 (Week 2): Patient will complete LB dressing with CGA  Skilled Therapeutic Interventions/Progress Updates:    Pt greeted semi-reclined in bed awake this morning and agreeable to OT treatment session. Pt then dozing off when OT cued him to come to sitting. Pt needed more than reasonable amount of time to initiate bed mobility and multimodal cues, but eventually got to sitting EOB with supervision. Increased time at the EOB due to pt report of headache. Pt needed verbal cues for safe hand placement with sit<>stand w/ RW, min A, then ambulated to bathroom w/ CGA and sat on commode. Pt voided bladder. Pt able to doff pants seated on commode, then transferred into shower with CGA and increased time. Bathing completed sit<>stand with CGA for balance when standing to wash buttocks. Dressing tasks completed at EOB with some decreased awareness of R side as he was unaware he had not thread R UE into the arm hole. Supervision to thread pants, but CGA for balance when standing to pull them up. Pt very slow to initiate all BADL tasks throughout session. Pt left seated in wc at end of session with alarm belt on, call bell in reach, and needs met.   Therapy Documentation Precautions:  Precautions Precautions: Fall Precaution Comments: Labile BP, monitor with mobility Restrictions Weight Bearing Restrictions: No Pain: Pain Assessment Pain Scale: 6 Headache, rest and repositioned for comfort and pain relief.   Therapy/Group: Individual Therapy  Valma Cava 03/07/2021, 8:58 AM

## 2021-03-07 NOTE — Progress Notes (Addendum)
PROGRESS NOTE   Subjective/Complaints:  Pt took shower needs supervision for safety and dressing , having visuospatial issues  ROS: Denies CP, SOB, N/V/D  Objective:   No results found. Recent Labs    03/07/21 0602  WBC 6.9  HGB 13.6  HCT 39.8  PLT 188    Recent Labs    03/07/21 0602  NA 137  K 3.9  CL 105  CO2 24  GLUCOSE 97  BUN 19  CREATININE 1.09  CALCIUM 8.8*     Intake/Output Summary (Last 24 hours) at 03/07/2021 6203 Last data filed at 03/07/2021 5597 Gross per 24 hour  Intake 792.88 ml  Output --  Net 792.88 ml      Pressure Injury 02/26/21 Coccyx Mid;Posterior Stage 2 -  Partial thickness loss of dermis presenting as a shallow open injury with a red, pink wound bed without slough. (Active)  02/26/21 1702  Location: Coccyx  Location Orientation: Mid;Posterior  Staging: Stage 2 -  Partial thickness loss of dermis presenting as a shallow open injury with a red, pink wound bed without slough.  Wound Description (Comments):   Present on Admission: Yes    Physical Exam: Vital Signs Blood pressure (!) 160/84, pulse (!) 52, temperature 97.9 F (36.6 C), temperature source Oral, resp. rate 16, height 6' (1.829 m), weight 69.5 kg, SpO2 97 %.  General: No acute distress Mood and affect are appropriate Heart: Regular rate and rhythm no rubs murmurs or extra sounds Lungs: Clear to auscultation, breathing unlabored, no rales or wheezes Abdomen: Positive bowel sounds, soft nontender to palpation, nondistended Extremities: No clubbing, cyanosis, or edema Skin: lower sacral at midline Stage 1 with tiny 67mm sacral stage 2 open area  Neurologic: Cranial nerves II through XII intact, motor strength is 5/5 in bilateral deltoid, bicep, tricep, grip, hip flexor, knee extensors, ankle dorsiflexor and plantar flexor Sensory exam normal sensation to light touch and proprioception in bilateral upper and lower  extremities Cerebellar exam normal finger to nose to finger as well as heel to shin in bilateral upper and lower extremities Musculoskeletal: Full range of motion in all 4 extremities. No joint swelling   Assessment/Plan: 1. Functional deficits which require 3+ hours per day of interdisciplinary therapy in a comprehensive inpatient rehab setting. Physiatrist is providing close team supervision and 24 hour management of active medical problems listed below. Physiatrist and rehab team continue to assess barriers to discharge/monitor patient progress toward functional and medical goals  Care Tool:  Bathing    Body parts bathed by patient: Right arm, Left arm, Chest, Abdomen, Front perineal area, Right upper leg, Left upper leg, Right lower leg, Left lower leg, Face   Body parts bathed by helper: Buttocks     Bathing assist Assist Level: Minimal Assistance - Patient > 75%     Upper Body Dressing/Undressing Upper body dressing   What is the patient wearing?: Pull over shirt    Upper body assist Assist Level: Set up assist    Lower Body Dressing/Undressing Lower body dressing      What is the patient wearing?: Pants     Lower body assist Assist for lower body dressing: Contact Guard/Touching assist  Toileting Toileting Toileting Activity did not occur Landscape architect and hygiene only): N/A (no void or bm)  Toileting assist Assist for toileting: Minimal Assistance - Patient > 75%     Transfers Chair/bed transfer  Transfers assist     Chair/bed transfer assist level: Contact Guard/Touching assist Chair/bed transfer assistive device: Programmer, multimedia   Ambulation assist      Assist level: Contact Guard/Touching assist Assistive device: Walker-rolling Max distance: 30 ft   Walk 10 feet activity   Assist     Assist level: Minimal Assistance - Patient > 75% Assistive device: Walker-rolling   Walk 50 feet activity   Assist Walk 50  feet with 2 turns activity did not occur: Safety/medical concerns (decreased activity tolerance)         Walk 150 feet activity   Assist Walk 150 feet activity did not occur: Safety/medical concerns         Walk 10 feet on uneven surface  activity   Assist     Assist level: Minimal Assistance - Patient > 75% Assistive device: Walker-rolling   Wheelchair     Assist Is the patient using a wheelchair?: Yes Type of Wheelchair: Manual    Wheelchair assist level: Minimal Assistance - Patient > 75% Max wheelchair distance: 55 ft    Wheelchair 50 feet with 2 turns activity    Assist        Assist Level: Minimal Assistance - Patient > 75%   Wheelchair 150 feet activity     Assist      Assist Level: Total Assistance - Patient < 25%   Blood pressure (!) 160/84, pulse (!) 52, temperature 97.9 F (36.6 C), temperature source Oral, resp. rate 16, height 6' (1.829 m), weight 69.5 kg, SpO2 97 %.  Medical Problem List and Plan:   SAH/IPH due to hypertensive emergency- with impaired ADLs/mobility  Continue CIR, Per OT looks more alert today  Antithrombotics . DVT Prophylaxis/Anticoagulation: Mechanical: Sequential compression devices, below knee Bilateral lower extremities due to brain bleed 2. Pain Management/Headaches: Norco for HA as needed and tylenol prn-    -11/17began topamax 25mg  bid for h/a prophylaxis and voltaren gel for bilateral knee pain  Controlled with meds on 11/20 3. Mood/sleep: emotional support- and monitor if needs  neuropsychology.   -antipsychotics: none  - sleep chart  -continue scheduled trazodone at night  Telemetry sitter for safety 4. Neuropsych- the patient is not  capable of making decisions for himself.  5. Skin/Wounds- routine wound care. Mainly with lower sacral stage one, tiny 6mm stage 2 open area , cont foam dressing  6. Fluids/Electrolytes/Nutrition: eating next to nothing  -encourage PO  -cognitive component 7. HTN  in setting of hypertensive emergency with orthostasis-   -entresto,  and aldactone  Coreg DC'd given heart rate  Scheduled hydralazine DC'd given blood pressure drops  -d/c prn catapres and cont prn hydralazine  Remains labile on 11/20  IVF started x6 hours on 11/20  ECG ordered Vitals:   03/06/21 1927 03/07/21 0304  BP: (!) 123/59 (!) 160/84  Pulse: (!) 59 (!) 52  Resp: 18 16  Temp: 97.9 F (36.6 C) 97.9 F (36.6 C)  SpO2: 92% 97%  No hypotension this am   8. CHF with EF 40-45%- con't Entresto and monitor   Filed Weights   03/04/21 1500 03/06/21 0511 03/07/21 0304  Weight: 71.3 kg 68.6 kg 69.5 kg   Monitor lung exam - clear today after IVF  9. COPD- con't  O2 and wean as tolerated-also continue his regimen of COPD meds- including Pulmicort, Albuterol prn, Brovana. And change as required.  10. Constipation-   11/17 moving bowels 11. UTI- resolved- s/p IV rocephin 12. Smoker- tobacco- abuse- counseling and Nicoderm 21 mg daily 14. Hx of prostate CA s/p XRT seeding 16. GERD- con't protonix and monitor/change as required 17. AAA/CAD/HLD- con't to monitor   LOS: 9 days A FACE TO FACE EVALUATION WAS PERFORMED  Charlett Blake 03/07/2021, 9:06 AM

## 2021-03-07 NOTE — Progress Notes (Signed)
Initial Nutrition Assessment  DOCUMENTATION CODES:   Not applicable  INTERVENTION:  Continue Ensure Enlive po BID, each supplement provides 350 kcal and 20 grams of protein.  Encourage adequate PO intake.   NUTRITION DIAGNOSIS:   Increased nutrient needs related to chronic illness (COPD) as evidenced by estimated needs.  GOAL:   Patient will meet greater than or equal to 90% of their needs  MONITOR:   PO intake, Supplement acceptance, Labs, Weight trends, Skin, I & O's  REASON FOR ASSESSMENT:   Malnutrition Screening Tool    ASSESSMENT:   75 y.o. male with hx of CHF EF 30-35%, HTN, HLD, GERD, COPD, prostate CA s/p XRT seeding; AAA; tobacco abuse and CAD. Presents with  with syncope and fall. Found to have SDH and IPH as well as cerebral edema. SAH/IPH due to hypertensive emergency- with impaired ADLs/mobility.  Meal completion has been 50-80%. Pt reports having a good appetite currently and PTA with usual consumption of at least 3 meals a day. Pt currently has Ensure ordered and has been consuming them. RD to continue with current orders to aid in caloric and protein needs. Noted pt with a 10% weight loss over the past 2 weeks per weight records, weight loss likely related to fluid status.   NUTRITION - FOCUSED PHYSICAL EXAM:  Flowsheet Row Most Recent Value  Orbital Region Unable to assess  Upper Arm Region Moderate depletion  Thoracic and Lumbar Region Unable to assess  Buccal Region Unable to assess  Temple Region Moderate depletion  Clavicle Bone Region Mild depletion  Clavicle and Acromion Bone Region Mild depletion  Scapular Bone Region Unable to assess  Dorsal Hand Unable to assess  Patellar Region Mild depletion  Anterior Thigh Region Mild depletion  Posterior Calf Region Moderate depletion  Edema (RD Assessment) None  Hair Reviewed  Eyes Reviewed  Mouth Reviewed  Skin Reviewed  Nails Reviewed      Labs and medications reviewed.   Diet Order:    Diet Order             Diet Heart Room service appropriate? Yes; Fluid consistency: Thin  Diet effective now                   EDUCATION NEEDS:   Not appropriate for education at this time  Skin:  Skin Assessment: Skin Integrity Issues: Skin Integrity Issues:: Stage II Stage II: coccyx  Last BM:  11/18  Height:   Ht Readings from Last 1 Encounters:  02/28/21 6' (1.829 m)    Weight:   Wt Readings from Last 1 Encounters:  03/07/21 69.5 kg   BMI:  Body mass index is 20.78 kg/m.  Estimated Nutritional Needs:   Kcal:  1900-2100  Protein:  90-100 grams  Fluid:  >/= 1.9 L/day  Corrin Parker, MS, RD, LDN RD pager number/after hours weekend pager number on Am[employer].

## 2021-03-07 NOTE — Progress Notes (Signed)
Physical Therapy Session Note  Patient Details  Name: [name] MRN: [id number] Date of Birth: Jun 19, 1945  Today's Date: 03/07/2021 PT Individual Time: 1130-1200 PT Individual Time Calculation (min): 30 min   Short Term Goals: Week 1:  PT Short Term Goal 1 (Week 1): Patient will perform basic transfers with CGA >75% of the time using LRAD. PT Short Term Goal 2 (Week 1): Patient will ambulate >100 feet with CGA using LRAD. PT Short Term Goal 3 (Week 1): Patient will initiate stair training for increased endurance/strength with functional mobility. PT Short Term Goal 4 (Week 1): Patient will improve his Berg Score by >8 points to meet the MCID.  Skilled Therapeutic Interventions/Progress Updates: Pt presents supine in bed and reluctantly agreeable to therapy.  Pt already wearing TED hose and BP monitored at 152/75 in supine.  Pt transferred sup to sit using side rail w/ supervision and then scooted to EOB.  Pt BP taken in sitting at 143/69 and pt transferred sit to stand w/ CGA and verbal cues for hand placement (pushes up w/ 1 hand on RW, tipping it).  BP taken in standing at 92/63 w/o c/o symptoms, no LOB, flexed posture.  Pt returned to sitting EOB and BP returned to 134/67.  NT notified of BP and will discuss w/ nursing.  Pt transferred sit to supine w/ supervision.  Bed alarm on and all needs in reach.     Therapy Documentation Precautions:  Precautions Precautions: Fall Precaution Comments: Labile BP, monitor with mobility Restrictions Weight Bearing Restrictions: No General:   Vital Signs:  Pain: 0/10       Therapy/Group: Individual Therapy  Ladoris Gene 03/07/2021, 12:16 PM

## 2021-03-07 NOTE — Progress Notes (Signed)
Physical Therapy Weekly Progress Note  Patient Details  Name: [name] MRN: [id number] Date of Birth: 02-May-1945  Beginning of progress report period: February 27, 2021 End of progress report period: March 07, 2021  Today's Date: 03/07/2021 PT Individual Time: 2025-4270 PT Individual Time Calculation (min): 71 min   Patient has met 2 of 4 short term goals.  Pt is progressing slowly toward mobility goals, with slight improvements in bed mobility, balance, transfers, and ambulation. Pt has been very lethargic, which has impaired his progress. Pt performing mobility at a minA level overall, but moves very slowly and with poor body mechanics and safety awareness. Pt will benefit from family education prior to DC.  Patient continues to demonstrate the following deficits muscle weakness, decreased cardiorespiratoy endurance, decreased motor planning, decreased initiation, decreased problem solving, and decreased memory, and decreased sitting balance, decreased standing balance, decreased postural control, hemiplegia, and decreased balance strategies and therefore will continue to benefit from skilled PT intervention to increase functional independence with mobility.  Patient progressing toward long term goals..  Continue plan of care.  PT Short Term Goals Week 1:  PT Short Term Goal 1 (Week 1): Patient will perform basic transfers with CGA >75% of the time using LRAD. PT Short Term Goal 1 - Progress (Week 1): Met PT Short Term Goal 2 (Week 1): Patient will ambulate >100 feet with CGA using LRAD. PT Short Term Goal 2 - Progress (Week 1): Progressing toward goal PT Short Term Goal 3 (Week 1): Patient will initiate stair training for increased endurance/strength with functional mobility. PT Short Term Goal 3 - Progress (Week 1): Met PT Short Term Goal 4 (Week 1): Patient will improve his Berg Score by >8 points to meet the MCID. PT Short Term Goal 4 - Progress (Week 1): Progressing toward  goal PT Short Term Goal 5 (Week 1): Pt will perform bed mobility consistently with CGA. Week 2:  PT Short Term Goal 1 (Week 2): Pt will ambulate 100' with CGA and LRAD. PT Short Term Goal 2 (Week 2): Pt willi mprove BERG score by MCID >8 points.  Skilled Therapeutic Interventions/Progress Updates:  Ambulation/gait training;Cognitive remediation/compensation;Discharge planning;DME/adaptive equipment instruction;Functional mobility training;Pain management;Psychosocial support;Splinting/orthotics;Therapeutic Activities;UE/LE Strength taining/ROM;Visual/perceptual remediation/compensation;UE/LE Coordination activities;Therapeutic Exercise;Wheelchair propulsion/positioning;Stair training;Skin care/wound management;Patient/family education;Neuromuscular re-education;Functional electrical stimulation;Disease management/prevention;Community reintegration;Balance/vestibular training   Pt received supine in bed and agrees to therapy. Reports chornic pain in both knees. Number not provided. PT provides rest breaks as needed to manage pain. Pt performs supine to sit with minA and cues for sequencing. Sit to stand and stands step transfer to [facility] with close supervision and cues for RW management and positioning for safety. WC transport to gym for time management. Pt performs sit to stand multiple reps with close supervision and cues for body mechanics. Pt performs NMR for postural control standing balance, and compensatory reactions by tossing 2lb ball at trampoline and catching rebound. Pt performs 3x10 chest passes, 2x10 overhead passes to promote improved posture and increased balance challenge, and 1x20 tossing to the L across body to incorporate trunk rotation into movement. Pt requires CGA and occasional use of RW for stability. Pt taken to restroom and performs stand pivot transfer to toilet with CGA. Following pt ambulates to sink without AD and with CGA.  Pt ambulates x100' with RW and with cues to "stand up  with good posture and walk with long steps". Pt ambulates with very forward flexed posture and unless cued constantly, utilizes shuffling gait pattern. Can take  longer strides when cued but quickly reverts to shuffling pattern. PT provides minA to provide manual cueing for gait speed, otherwise pt ambulates so slowly that he nearly stops, unless cued.    Pt attempts Berg balance tests but loses balance several times during first few tasks, abruptly sitting back in WC. After several attempts pt is able to complete Imelda Pillow test with a score of 15/56, improving from score of 6/56 on previous attempt.  WC transport back to room. Left seated in WC with alarm intact and all needs within reach.  Therapy Documentation Precautions:  Precautions Precautions: Fall Precaution Comments: Labile BP, monitor with mobility Restrictions Weight Bearing Restrictions: No  Therapy/Group: Individual Therapy  Breck Coons, PT, DPT 03/07/2021, 1:44 PM

## 2021-03-08 DIAGNOSIS — K5901 Slow transit constipation: Secondary | ICD-10-CM | POA: Diagnosis not present

## 2021-03-08 DIAGNOSIS — I10 Essential (primary) hypertension: Secondary | ICD-10-CM | POA: Diagnosis not present

## 2021-03-08 DIAGNOSIS — E440 Moderate protein-calorie malnutrition: Secondary | ICD-10-CM | POA: Diagnosis not present

## 2021-03-08 DIAGNOSIS — I5042 Chronic combined systolic (congestive) and diastolic (congestive) heart failure: Secondary | ICD-10-CM | POA: Diagnosis not present

## 2021-03-08 DIAGNOSIS — I619 Nontraumatic intracerebral hemorrhage, unspecified: Secondary | ICD-10-CM | POA: Diagnosis not present

## 2021-03-08 MED ORDER — SORBITOL 70 % SOLN
60.0000 mL | Status: AC
  Administered 2021-03-08: 60 mL via ORAL
  Filled 2021-03-08: qty 60

## 2021-03-08 MED ORDER — SENNOSIDES-DOCUSATE SODIUM 8.6-50 MG PO TABS
2.0000 | ORAL_TABLET | Freq: Every day | ORAL | Status: DC
  Administered 2021-03-08 – 2021-03-15 (×8): 2 via ORAL
  Filled 2021-03-08 (×8): qty 2

## 2021-03-08 NOTE — Progress Notes (Signed)
Patient ID: [name], male   DOB: [birth year], 75 y.o.   MRN: [id number]  SW met with pt in room to provide brief updates from team conference, and d/c date 11/30. Pt confirmed with SW she can speak with his son [name] about pt d/c plan. SW to make efforts to meet with pt and pt wife (here in  hospital) to discuss discharge plan as well.   SW called pt son [name] [phone]) to provide updates from team conference, and d/c 11/30. SW informed was not aware on him being a contact since primarily discussed with his mother. Reports they are all working on coordinating care for both parents and seeing who can rotate care. SW informed on d/c recs of HH being recommended. States to ask his mother which agency was used in past. Tentative d/c fam edu on 11/25 10am-2pm. [name] will confirm with SW if his brother is able to make this session as well.   *SW met with pt and pt wife in room to discuss above. Wife is concerned about support they both will have. SW encouraged her to speak with their son [name] in more detail to discuss further. [facility] preference is [facility].  SW sent HHPT/OT/SLP referral to [facility] and waiting on follow-up.  Loralee Pacas, MSW, [facility] Office: [phone] Cell: [phone] Fax: [phone]

## 2021-03-08 NOTE — Plan of Care (Signed)
  Problem: RH Problem Solving Goal: LTG Patient will demonstrate problem solving for (SLP) Description: LTG:  Patient will demonstrate problem solving for basic/complex daily situations with cues  (SLP) Flowsheets (Taken 03/08/2021 [phone]) LTG: Patient will demonstrate problem solving for (SLP): Basic daily situations Note: Goal downgraded due to slow progress   Problem: RH Memory Goal: LTG Patient will use memory compensatory aids to (SLP) Description: LTG:  Patient will use memory compensatory aids to recall biographical/new, daily complex information with cues (SLP) Flowsheets (Taken 03/08/2021 0643) LTG: Patient will use memory compensatory aids to (SLP): Minimal Assistance - Patient > 75% Note: Goal downgraded due to slow progress   Problem: RH Attention Goal: LTG Patient will demonstrate this level of attention during functional activites (SLP) Description: LTG:  Patient will will demonstrate this level of attention during functional activites (SLP) Flowsheets (Taken 03/08/2021 [phone]) LTG: Patient will demonstrate this level of attention during cognitive/linguistic activities with assistance of (SLP): Minimal Assistance - Patient > 75% Number of minutes patient will demonstrate attention during cognitive/linguistic activities: 45 Note: Goal downgraded due to slow progress   Problem: RH Awareness Goal: LTG: Patient will demonstrate awareness during functional activites type of (SLP) Description: LTG: Patient will demonstrate awareness during functional activites type of (SLP) Flowsheets (Taken 03/08/2021 0643) LTG: Patient will demonstrate awareness during cognitive/linguistic activities with assistance of (SLP): Minimal Assistance - Patient > 75% Note: Goal downgraded due to slow progress

## 2021-03-08 NOTE — Progress Notes (Signed)
Physical Therapy Session Note  Patient Details  Name: [name] MRN: [id number] Date of Birth: 06-01-1945  Today's Date: 03/08/2021 PT Individual Time: 1302-1358 PT Individual Time Calculation (min): 56 min   Short Term Goals: Week 2:  PT Short Term Goal 1 (Week 2): Pt will ambulate 100' with CGA and LRAD. PT Short Term Goal 2 (Week 2): Pt willi mprove BERG score by MCID >8 points.  Skilled Therapeutic Interventions/Progress Updates:     Pt received supine in bed asleep. Easily roused and agrees to therapy. Reports pain in both knees, chronic in nature and no number provided. PT ace wraps both knees for compressive support to alleviate pain and pt verbalizes some improvement following wrapping. Supine to sit slowly with verbal cues for positioning. Pt stands from bed with RW and cues for hand placement, then ambulates x100' with CGA and cues for upright gaze to improve posture and balance and increasing gait speed and bilateral stride length to decrease risk for falls. Pt ambulates additional bouts of 100', 100', and 120' with seated rest breaks and consistent cueing for posture, stride length, and increasing proximity to RW for safety. Pt performs toilet transfer with CGA and cues for RW management and positioning for safety. Pt left sitting in dayroom with wife and other PT. RN aware.   Therapy Documentation Precautions:  Precautions Precautions: Fall Precaution Comments: Labile BP, monitor with mobility Restrictions Weight Bearing Restrictions: No    Therapy/Group: Individual Therapy  Breck Coons, PT, DPT 03/08/2021, 2:59 PM

## 2021-03-08 NOTE — Progress Notes (Signed)
Physical Therapy Session Note  Patient Details  Name: [name] MRN: [id number] Date of Birth: 1945/11/01  Today's Date: 03/08/2021 PT Individual Time: 0915-0955 PT Individual Time Calculation (min): 40 min   Short Term Goals: Week 2:  PT Short Term Goal 1 (Week 2): Pt will ambulate 100' with CGA and LRAD. PT Short Term Goal 2 (Week 2): Pt willi mprove BERG score by MCID >8 points.  Skilled Therapeutic Interventions/Progress Updates:    Pt received seated in bed, agreeable with encouragement to participate in PT session. Pt reports he currently has a headache, not rated. Pt reports he has been premedicated for pain prior to session. Seated BP in bed 156/73. Supine to sit with min A for trunk elevation. Pt reports urge to urinate. Sit to stand with CGA to RW. Ambulatory transfer into bathroom with RW and CGA. Pt continent of urine while seated on toilet, CGA for balance during clothing management. Pt returned to sitting in w/c. Seated BP 171/89. Sit to stand with CGA to RW. Standing BP 105/57. Pt remains asymptomatic during all BP fluctuations. Ambulation x 58 ft with RW and CGA for balance, flexed trunk and shoulders elevated during gait. Seated BP 145/85 following gait. Pt reports soreness in BLE and declines further participation at this time. Ambulation x 25 ft with RW and CGA back to bed. Seated BP 131/76. Pt returned to sitting in bed at Supervision level. Pt left seated in bed with needs in reach, bed alarm in place. Nursing notified of BP fluctuations and especially elevated BP (171/89) during session.  Therapy Documentation Precautions:  Precautions Precautions: Fall Precaution Comments: Labile BP, monitor with mobility Restrictions Weight Bearing Restrictions: No    Therapy/Group: Individual Therapy   Excell Seltzer, PT, DPT, CSRS  03/08/2021, 12:13 PM

## 2021-03-08 NOTE — Progress Notes (Signed)
Physical Therapy Session Note  Patient Details  Name: [name] MRN: [id number] Date of Birth: October 02, 1945  Today's Date: 03/08/2021 PT Individual Time: 1035-1120 PT Individual Time Calculation (min): 45 min   Short Term Goals: Week 2:  PT Short Term Goal 1 (Week 2): Pt will ambulate 100' with CGA and LRAD. PT Short Term Goal 2 (Week 2): Pt willi mprove BERG score by MCID >8 points.  Skilled Therapeutic Interventions/Progress Updates:     Patient in bed upon PT arrival. Initiated session to make up missed minutes for patient. Patient alert and agreeable to PT session. Patient reported 6/10 headache and 5/10 B knee R>L pain during session, RN made aware. PT provided repositioning, rest breaks, and distraction as pain interventions throughout session.   Therapeutic Activity: Bed Mobility: Patient performed supine to/from sit with supervision with use of hospital bed features.  Transfers: Patient performed sit to/from stand x2 with close supervision. Provided verbal cues for scooting forward and forward weight shift.  Gait Training:  Patient ambulated 128 feet x2 using RW with CGA and w/c follow for safety/endurance. Ambulated with decreased gait speed, decreased step length and height, increased B hip and knee flexion in stance, narrow BOS, forward trunk lean, and downward head gaze. Provided verbal cues for erect posture, looking ahead, increased BOS, and increased gait speed for increased step length and height.  Discussed B knee pain, patient reports hx of OA and use of hot packs for pain management. Educated on benefits of ROM before first time OOB and throughout the day. Provided soft tissue mobilization to quads, hamstrings, and calves between gait trials for pain management. Noted tight hamstrings and heel cords, performed seated hamstring/gastroc stretch with over pressure for DF 2x1 min bilaterally. Educated on importance of stretching for improved ROM and to reduce posterior bias  and knee extension in standing. Patient receptive and agreeable to all education provided.   Patient in bed with heat packs to B knees for pain management at end of session with breaks locked, bed alarm set, and all needs within reach.   Therapy Documentation Precautions:  Precautions Precautions: Fall Precaution Comments: Labile BP, monitor with mobility Restrictions Weight Bearing Restrictions: No    Therapy/Group: Individual Therapy  Addilynn Mowrer L Zackaria Burkey PT, DPT  03/08/2021, 11:55 AM

## 2021-03-08 NOTE — Progress Notes (Signed)
Physical Therapy Session Note  Patient Details  Name: [name] MRN: [id number] Date of Birth: 1946/02/09  Today's Date: 03/08/2021 PT Individual Time: 4718-5501 PT Individual Time Calculation (min): 37 min   Short Term Goals: Week 2:  PT Short Term Goal 1 (Week 2): Pt will ambulate 100' with CGA and LRAD. PT Short Term Goal 2 (Week 2): Pt willi mprove BERG score by MCID >8 points.  Skilled Therapeutic Interventions/Progress Updates:    Patient received sitting in wc visiting wife in [place], agreeable to PT with encouragement. Patient initially denies pain. PT transporting patient in wc to therapy gym for time management and energy conservation. He ambulated x34ft with RW and MinA. Multimodal cues to increase L foot clearance and step length. He does present with very NBOS and stepping on his own shoes at times. NuStep x6 mins B LE only for large amplitude reciprocal stepping. Patient then ambulating to wc x41ft with RW and MinA. Minimal carryover from earlier gait tx noted. Once in the wc, patient stating "I'm done now." PT unable to redirect patient toward therapeutic tasks. He kept stating that he was tired and wished to return to bed. Patient with short distance ambulatory transfer to bed. Upon laying down, patient reporting neck pain "up to (his) eyeballs." PT informing RN. Bed alarm on, call light within reach.   Therapy Documentation Precautions:  Precautions Precautions: Fall Precaution Comments: Labile BP, monitor with mobility Restrictions Weight Bearing Restrictions: No    Therapy/Group: Individual Therapy  Karoline Caldwell, PT, DPT, CBIS  03/08/2021, 10:03 AM

## 2021-03-08 NOTE — Progress Notes (Signed)
Speech Language Pathology Weekly Progress Note  Patient Details  Name: [name] MRN: [id number] Date of Birth: 16-Aug-1945  Beginning of progress report period: March 01, 2021 End of progress report period: March 08, 2021   Short Term Goals: Week 1: SLP Short Term Goal 1 (Week 1): Patient will demonstrate complex problem solving for functional tasks with Min verbal cues. SLP Short Term Goal 1 - Progress (Week 1): Not met SLP Short Term Goal 2 (Week 1): Patient will recall new, daily information with supervision level verbal cues. SLP Short Term Goal 2 - Progress (Week 1): Not met SLP Short Term Goal 3 (Week 1): Patient will self-monitor and correct errors during functional tasks with Min verbal cues. SLP Short Term Goal 3 - Progress (Week 1): Met SLP Short Term Goal 4 (Week 1): Patient will demonstrate sustained attention to functional tasks for 45 minutes with Min verbal cues for redirection. SLP Short Term Goal 4 - Progress (Week 1): Met    New Short Term Goals: Week 2: SLP Short Term Goal 1 (Week 2): Patient will demonstrate complex problem solving for functional tasks with Min verbal cues. SLP Short Term Goal 2 (Week 2): Patient will recall new, daily information with supervision level verbal cues. SLP Short Term Goal 3 (Week 2): Patient will self-monitor and correct errors during functional tasks with supervision verbal cues. SLP Short Term Goal 4 (Week 2): Patient will demonstrate sustained attention to functional tasks for 45 minutes with supervision verbal cues for redirection.  Weekly Progress Updates: Patient has made functional but inconsistent gains and has met 2 of 4 STGs this admission. Currently, patient requires overall Min-Mod A verbal cues to complete functional and familiar tasks safely in regards to problem solving, recall of functional information, emergent awareness and sustained attention. Patient's overall cognitive functioning can fluctuate throughout the  day and is impacted by fatigue and pain from headaches. Patient and family education ongoing. Patient would benefit from continued skilled SLP intervention to maximize his cognitive functioning and overall functional independence prior to discharge.      Intensity: Minumum of 1-2 x/day, 30 to 90 minutes Frequency: 3 to 5 out of 7 days Duration/Length of Stay: 03/16/21 Treatment/Interventions: Cognitive remediation/compensation;Internal/external aids;Therapeutic Activities;Environmental controls;Cueing hierarchy;Functional tasks;Patient/family education    Kelsi Benham 03/08/2021, 6:40 AM

## 2021-03-08 NOTE — Progress Notes (Addendum)
PROGRESS NOTE   Subjective/Complaints: Pt in bed. Still has headache. When it's severe can be "6/10".   ROS: Patient denies fever, rash, sore throat, blurred vision, nausea, vomiting, diarrhea, cough, shortness of breath or chest pain, joint or back pain, or mood change.   Objective:   No results found. Recent Labs    03/07/21 0602  WBC 6.9  HGB 13.6  HCT 39.8  PLT 188    Recent Labs    03/07/21 0602  NA 137  K 3.9  CL 105  CO2 24  GLUCOSE 97  BUN 19  CREATININE 1.09  CALCIUM 8.8*     Intake/Output Summary (Last 24 hours) at 03/08/2021 0973 Last data filed at 03/08/2021 5329 Gross per 24 hour  Intake 300 ml  Output --  Net 300 ml     Pressure Injury 02/26/21 Coccyx Mid;Posterior Stage 2 -  Partial thickness loss of dermis presenting as a shallow open injury with a red, pink wound bed without slough. (Active)  02/26/21 1702  Location: Coccyx  Location Orientation: Mid;Posterior  Staging: Stage 2 -  Partial thickness loss of dermis presenting as a shallow open injury with a red, pink wound bed without slough.  Wound Description (Comments):   Present on Admission: Yes    Physical Exam: Vital Signs Blood pressure (!) 170/76, pulse (!) 56, temperature (!) 97.4 F (36.3 C), temperature source Oral, resp. rate 18, height 6' (1.829 m), weight 69.5 kg, SpO2 96 %.  Constitutional: No distress . Vital signs reviewed. HEENT: NCAT, EOMI, oral membranes moist Neck: supple Cardiovascular: RRR without murmur. No JVD    Respiratory/Chest: CTA Bilaterally without wheezes or rales. Normal effort    GI/Abdomen: BS +, non-tender, non-distended Ext: no clubbing, cyanosis, or edema Psych: flat but cooperative Skin: lower sacral at midline Stage 1 with tiny 57mm sacral stage 2 open area  Neurologic: Cranial nerves II through XII intact, motor strength is 5/5 in bilateral deltoid, bicep, tricep, grip, hip flexor, knee  extensors, ankle dorsiflexor and plantar flexor Sensory exam normal for light touch and pain in all 4 limbs. No limb ataxia or cerebellar signs. No abnormal tone appreciated.   Musculoskeletal: mild knee pain with AROM   Assessment/Plan: 1. Functional deficits which require 3+ hours per day of interdisciplinary therapy in a comprehensive inpatient rehab setting. Physiatrist is providing close team supervision and 24 hour management of active medical problems listed below. Physiatrist and rehab team continue to assess barriers to discharge/monitor patient progress toward functional and medical goals  Care Tool:  Bathing    Body parts bathed by patient: Right arm, Left arm, Chest, Abdomen, Front perineal area, Right upper leg, Left upper leg, Right lower leg, Left lower leg, Face, Buttocks   Body parts bathed by helper: Buttocks     Bathing assist Assist Level: Contact Guard/Touching assist     Upper Body Dressing/Undressing Upper body dressing   What is the patient wearing?: Pull over shirt    Upper body assist Assist Level: Set up assist    Lower Body Dressing/Undressing Lower body dressing      What is the patient wearing?: Pants, Underwear/pull up     Lower  body assist Assist for lower body dressing: Contact Guard/Touching assist     Toileting Toileting Toileting Activity did not occur (Clothing management and hygiene only): N/A (no void or bm)  Toileting assist Assist for toileting: Contact Guard/Touching assist     Transfers Chair/bed transfer  Transfers assist     Chair/bed transfer assist level: Contact Guard/Touching assist Chair/bed transfer assistive device: Programmer, multimedia   Ambulation assist      Assist level: Contact Guard/Touching assist Assistive device: Walker-rolling Max distance: 30 ft   Walk 10 feet activity   Assist     Assist level: Minimal Assistance - Patient > 75% Assistive device: Walker-rolling   Walk 50  feet activity   Assist Walk 50 feet with 2 turns activity did not occur: Safety/medical concerns (decreased activity tolerance)         Walk 150 feet activity   Assist Walk 150 feet activity did not occur: Safety/medical concerns         Walk 10 feet on uneven surface  activity   Assist     Assist level: Minimal Assistance - Patient > 75% Assistive device: Walker-rolling   Wheelchair     Assist Is the patient using a wheelchair?: Yes Type of Wheelchair: Manual    Wheelchair assist level: Minimal Assistance - Patient > 75% Max wheelchair distance: 55 ft    Wheelchair 50 feet with 2 turns activity    Assist        Assist Level: Minimal Assistance - Patient > 75%   Wheelchair 150 feet activity     Assist      Assist Level: Total Assistance - Patient < 25%   Blood pressure (!) 170/76, pulse (!) 56, temperature (!) 97.4 F (36.3 C), temperature source Oral, resp. rate 18, height 6' (1.829 m), weight 69.5 kg, SpO2 96 %.  Medical Problem List and Plan:   SAH/IPH due to hypertensive emergency- with impaired ADLs/mobility  -Continue CIR therapies including PT, OT, and SLP. Interdisciplinary team conference today to discuss goals, barriers to discharge, and dc planning.   Antithrombotics . DVT Prophylaxis/Anticoagulation: Mechanical: Sequential compression devices, below knee Bilateral lower extremities due to brain bleed 2. Pain Management/Headaches: Norco for HA as needed and tylenol prn-    -11/20 topamax stopped by PA dt lethargy  -11/22 pt still reporting headache. Will stick with scheduled tylenol as only schedule med for now 3. Mood/sleep: emotional support- and monitor if needs  neuropsychology.   -antipsychotics: none  - sleep chart  -continue scheduled trazodone at night  Telemetry sitter for safety  11/22 more alert in general 4. Neuropsych- the patient is not  capable of making decisions for himself.  5. Skin/Wounds- routine wound  care. Mainly with lower sacral stage one, tiny 79mm stage 2 open area , cont foam dressing  6. Fluids/Electrolytes/Nutrition: eating next to nothing  -encourage PO  -cognitive component 7. HTN in setting of hypertensive emergency with orthostasis-   -entresto,  and aldactone  Coreg DC'd given heart rate, prn clonidine d'ced  Scheduled hydralazine DC'd given blood pressure drops but cont prn hydralazine    Vitals:   03/07/21 1958 03/08/21 0610  BP: (!) 114/58 (!) 170/76  Pulse: (!) 55 (!) 56  Resp: 18 18  Temp: (!) 97.4 F (36.3 C) (!) 97.4 F (36.3 C)  SpO2: 94% 96%    11/22 bp's overall improving. This morning's SBP elevated but isolated--observe  8. CHF with EF 40-45%- con't Entresto and monitor  Filed Weights   03/06/21 0511 03/07/21 0304 03/08/21 0500  Weight: 68.6 kg 69.5 kg 69.5 kg   Monitor weights. Need these done consistently 9. COPD- con't O2 and wean as tolerated-also continue his regimen of COPD meds- including Pulmicort, Albuterol prn, Brovana. And change as required.  10. Constipation-   11/22 last bm 11/18---sorbitol today 11. UTI- resolved- s/p IV rocephin 12. Smoker- tobacco- abuse- counseling and Nicoderm 21 mg daily 14. Hx of prostate CA s/p XRT seeding 16. GERD- con't protonix and monitor/change as required 17. AAA/CAD/HLD- con't to monitor   LOS: 10 days A FACE TO FACE EVALUATION WAS PERFORMED  Meredith Staggers 03/08/2021, 9:04 AM

## 2021-03-08 NOTE — Patient Care Conference (Signed)
Inpatient RehabilitationTeam Conference and Plan of Care Update Date: 03/08/2021   Time: 10:40 AM    Patient Name: [name]      Medical Record Number: [id number]  Date of Birth: 21-Nov-1945 Sex: Male         Room/Bed: 5C15C/5C15C-01 Payor Info: Payor: [employer] / Plan: [employer] PPO / Product Type: *No Product type* /    Admit Date/Time:  02/26/2021  4:12 PM  Primary Diagnosis:  Intraparenchymal hemorrhage of brain [facility])  Hospital Problems: Principal Problem:   Intraparenchymal hemorrhage of brain ([place]) Active Problems:   Pressure injury of skin   Orthostatic hypotension   Chronic combined systolic and diastolic congestive heart failure [facility])   Vascular headache    Expected Discharge Date: Expected Discharge Date: 03/16/21  Team Members Present: Physician leading conference: Dr. Alger Simons Social Worker Present: Loralee Pacas, [place] Nurse Present: Dorthula Nettles, RN PT Present: Tereasa Coop, PT OT Present: Laverle Hobby, OT SLP Present: Weston Anna, SLP PPS Coordinator present : Gunnar Fusi, SLP     Current Status/Progress Goal Weekly Team Focus  Bowel/Bladder   continent to b/b LBM 11/19  to remain continent      Swallow/Nutrition/ Hydration             ADL's   Supervision/CGA overall, poor endurance, limited by lethargy at times with poor attention and slow initiation  supervision overall  selfcare retraining, OOB tolerance, therapeutici activities, balance,   Mobility   supervision bed mobility, sit to stand with CGA, minA x100' with RW very slowly  Supervision  address knee pain, activity tolerance, initiation, gait training   Communication             Safety/Cognition/ Behavioral Observations  Min-Mod A: Can fluctuate throughout the day  Supervision  complex problem solving, recall, attention and emergent awareness   Pain   c/o of headaches tylenol scheduled Q6hrs  no pain      Skin   stage 1 to sacrum foam dressing  in place  wound to show signs of healing        Discharge Planning:  Pt to d/c to home with his wife. Wife reports she is working on addtl support from her sons. 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 Unsure on amount of support they will have at d/c.   Team Discussion: Going to try a different route to manage headaches. Wife is also a patient on CIR. He has poor endurance. Slow to progress d/t chronic knee pain. May try brace for the knee. Did a lot for his wife. Did not sleep through the night at home. Stage 2 to the coccyx covered with foam. Will discontinue the IV. Patient on target to meet rehab goals: yes, supervision goals  *See Care Plan and progress notes for long and short-term goals.   Revisions to Treatment Plan:  Adjusting medications  Teaching Needs: Family education, medication/pain management, skin/wound care, transfer training, etc.  Current Barriers to Discharge: Decreased caregiver support, Home enviroment access/layout, Wound care, Lack of/limited family support, and Medication compliance  Possible Resolutions to Barriers: Family education Follow up PT/OT Recommended DME     Medical Summary Current Status: pt more alert, but still having pain, h/a. Did not tolerate topamax. bp's labile  Barriers to Discharge: Medical stability   Possible Resolutions to [employer] Focus: ongoing monitoring of pt data and vs.   Continued  Need for Acute Rehabilitation Level of Care: The patient requires daily medical management by a physician with specialized training in physical medicine and rehabilitation for the following reasons: Direction of a multidisciplinary physical rehabilitation program to maximize functional independence : Yes Medical management of patient stability for increased activity during participation in an intensive rehabilitation regime.: Yes Analysis of  laboratory values and/or radiology reports with any subsequent need for medication adjustment and/or medical intervention. : Yes   I attest that I was present, lead the team conference, and concur with the assessment and plan of the team.   Cristi Loron 03/08/2021, 1:29 PM

## 2021-03-09 DIAGNOSIS — I5042 Chronic combined systolic (congestive) and diastolic (congestive) heart failure: Secondary | ICD-10-CM | POA: Diagnosis not present

## 2021-03-09 DIAGNOSIS — I10 Essential (primary) hypertension: Secondary | ICD-10-CM | POA: Diagnosis not present

## 2021-03-09 DIAGNOSIS — I619 Nontraumatic intracerebral hemorrhage, unspecified: Secondary | ICD-10-CM | POA: Diagnosis not present

## 2021-03-09 DIAGNOSIS — E440 Moderate protein-calorie malnutrition: Secondary | ICD-10-CM | POA: Diagnosis not present

## 2021-03-09 DIAGNOSIS — K5901 Slow transit constipation: Secondary | ICD-10-CM | POA: Diagnosis not present

## 2021-03-09 MED ORDER — HYDROCODONE-ACETAMINOPHEN 10-325 MG PO TABS
1.0000 | ORAL_TABLET | ORAL | Status: DC | PRN
  Administered 2021-03-10 – 2021-03-14 (×6): 1 via ORAL
  Filled 2021-03-09 (×6): qty 1

## 2021-03-09 MED ORDER — ACETAMINOPHEN 500 MG PO TABS: 500.0000 mg | ORAL_TABLET | Freq: Three times a day (TID) | ORAL | Status: DC

## 2021-03-09 NOTE — Progress Notes (Signed)
Nutrition Follow-up  DOCUMENTATION CODES:   Not applicable  INTERVENTION:  Continue Ensure Enlive po BID, each supplement provides 350 kcal and 20 grams of protein.   Encourage adequate PO intake.   NUTRITION DIAGNOSIS:   Increased nutrient needs related to chronic illness (COPD) as evidenced by estimated needs; ongoing  GOAL:   Patient will meet greater than or equal to 90% of their needs; progressing  MONITOR:   PO intake, Supplement acceptance, Labs, Weight trends, Skin, I & O's  REASON FOR ASSESSMENT:   Malnutrition Screening Tool    ASSESSMENT:   75 y.o. male with hx of CHF EF 30-35%, HTN, HLD, GERD, COPD, prostate CA s/p XRT seeding; AAA; tobacco abuse and CAD. Presents with  with syncope and fall. Found to have SDH and IPH as well as cerebral edema. SAH/IPH due to hypertensive emergency- with impaired ADLs/mobility.  Meal completion has been varied from 25-100% with 100% at breakfast this morning. Pt has been tolerating his PO diet. Pt currently has Ensure ordered and has been consuming them. RD to continue with current orders to aid in caloric and protein needs.  Labs and medications reviewed.   Diet Order:   Diet Order             Diet Heart Room service appropriate? Yes; Fluid consistency: Thin  Diet effective now                   EDUCATION NEEDS:   Not appropriate for education at this time  Skin:  Skin Assessment: Reviewed RN Assessment Skin Integrity Issues:: Stage II Stage II: coccyx  Last BM:  11/22  Height:   Ht Readings from Last 1 Encounters:  02/28/21 6' (1.829 m)    Weight:   Wt Readings from Last 1 Encounters:  03/09/21 69.3 kg   BMI:  Body mass index is 20.72 kg/m.  Estimated Nutritional Needs:   Kcal:  1900-2100  Protein:  90-100 grams  Fluid:  >/= 1.9 L/day  Corrin Parker, MS, RD, LDN RD pager number/after hours weekend pager number on Amion.

## 2021-03-09 NOTE — Progress Notes (Signed)
Speech Language Pathology Daily Session Note  Patient Details  Name: [name] MRN: [id number] Date of Birth: 24-May-1945  Today's Date: 03/09/2021 SLP Individual Time: 0815-0855 SLP Individual Time Calculation (min): 40 min  Short Term Goals: Week 2: SLP Short Term Goal 1 (Week 2): Patient will demonstrate complex problem solving for functional tasks with Min verbal cues. SLP Short Term Goal 2 (Week 2): Patient will recall new, daily information with supervision level verbal cues. SLP Short Term Goal 3 (Week 2): Patient will self-monitor and correct errors during functional tasks with supervision verbal cues. SLP Short Term Goal 4 (Week 2): Patient will demonstrate sustained attention to functional tasks for 45 minutes with supervision verbal cues for redirection.  Skilled Therapeutic Interventions: Skilled treatment session focused on cognitive goals. Upon arrival, patient was awake in bed and agreeable to ST session. SLP facilitated session by providing Mod-Max verbal cues for recall of his current medications and their functions. Patient reported he did not utilize a pill organizer at home but was open to utilizing one at discharge to maximize safety with medications. Patient appeared lethargic throughout session with perseveration on headache and left knee pain. RN aware and administered medications and ice was also applied. Patient recalled events from previous therapy sessions with supervision level verbal cues. Patient left upright in wheelchair with alarm on and all needs within reach. Continue with current plan of care.      Pain Pain Assessment Pain Scale: 0-10 Pain Score: 10-Worst pain ever Pain Type: Chronic pain Pain Location: Knee Pain Orientation: Right;Left Pain Descriptors / Indicators: Aching Pain Frequency: Intermittent Pain Onset: On-going Patients Stated Pain Goal: 0 Pain Intervention(s): Medication (See eMAR);Repositioned;Cold applied  Therapy/Group:  Individual Therapy  [place] 03/09/2021, 12:15 PM

## 2021-03-09 NOTE — Progress Notes (Signed)
Physical Therapy Session Note  Patient Details  Name: [name] MRN: [id number] Date of Birth: Aug 23, 1945  Today's Date: 03/09/2021 PT Individual Time: 0930-1015 PT Individual Time Calculation (min): 45 min  and Today's Date: 03/09/2021 PT Missed Time: 30 Minutes Missed Time Reason: Pain  Short Term Goals: Week 2:  PT Short Term Goal 1 (Week 2): Pt will ambulate 100' with CGA and LRAD. PT Short Term Goal 2 (Week 2): Pt willi mprove BERG score by MCID >8 points.  Skilled Therapeutic Interventions/Progress Updates:     1st Session: Pt received seated in [facility] and agrees to therapy, but reports significant pain in both knees. Number not provided. PT addresses pain with moist heat. WC transport to gym for time management. Pt performs stand step transfer to mat with RW and verbal cues for safe RW management and sequencing. Sit to supine with cues for positioning. PT applies moist head to both knees for 10 minutes. Pt reports slight improvement in pain and no adverse effects noted from heat. While supine, pt performs 1x10 supine bridges but reports that this aggravates knee pain. Pt rolls from supine to prone with cues for positioning, with intent of providing soft tissue stretch to hip flexors. Pt transitions to sidelying and performs x10 clamshells with R lower extremity for hip strengthening. Supine to sit with CGA and cues for sequencing. Pt ambulates from main gym to elevators, navigates elevator with cues for initiation and increasing gait speed for safety. Pt then ambulates from elevator to room. PT provides occasional tactile cues for posture, as well as verbal cues for increasing stride length and width, and increasing gait speed. Total ambulation x400'. Pt left supine in bed with alarm intact and all needs within reach.   2nd Session: Pt received supine in bed and requests to defer therapy at this time due to knee pain. PT will follow up as appropriate.  Therapy  Documentation Precautions:  Precautions Precautions: Fall Precaution Comments: Labile BP, monitor with mobility Restrictions Weight Bearing Restrictions: No    Therapy/Group: Individual Therapy  Breck Coons, PT, DPT 03/09/2021, 3:49 PM

## 2021-03-09 NOTE — Progress Notes (Signed)
Pt slept throughout the night.

## 2021-03-09 NOTE — Progress Notes (Signed)
Occupational Therapy Session Note  Patient Details  Name: [name] MRN: [id number] Date of Birth: 03-Jul-1945  Today's Date: 03/09/2021 OT Individual Time: 0700-0759 OT Individual Time Calculation (min): 59 min    Short Term Goals: Week 1:  OT Short Term Goal 1 (Week 1): Patient will complete toilet transfer with Min guard. OT Short Term Goal 1 - Progress (Week 1): Met OT Short Term Goal 2 (Week 1): Patient will complete shower transfer with Min guard. OT Short Term Goal 2 - Progress (Week 1): Met OT Short Term Goal 3 (Week 1): Patient will don LB clothing with Min gaurd. OT Short Term Goal 3 - Progress (Week 1): Met Week 2:  OT Short Term Goal 1 (Week 2): Patient will initiate bed mobility with min cues OT Short Term Goal 2 (Week 2): Patient will maintain standing balance with CGA within BADL task OT Short Term Goal 3 (Week 2): Patient will complete LB dressing with CGA  Skilled Therapeutic Interventions/Progress Updates:  Patient met lying spine in bed asleep with breakfast meal in front of him. Once alert/awake patient in agreement with OT treatment session. Patient reports decreased pain in bilateral knees compared to yesterday. Ace wrapped bilateral knees for pain management and donned thigh high TED hose for BP support as BP 85/49 seated in wc without TED hose on. BP remained low but patient asymptomatic throughout treatment session. Additional time allowed for patient to complete breakfast meal. Required frequent redirection for attention to meal. Patient then completed 3/3 grooming tasks standing at sink level with Min guard and cues for upright posture. Functional mobility to [facility] gym with RW and Min guard. Patient participated in activity with focus on furniture transfers, visual scanning, household mobility with use of RW and static/dynamic balance with and without BUE support while reaching outside of BOS. Patient required Min cues for attention to task throughout. Noted patient  frequently bumps into furniture on R. Session concluded with patient seated in wc with call bell within reach, chair alarm activated and all needs met.   Therapy Documentation Precautions:  Precautions Precautions: Fall Precaution Comments: Labile BP, monitor with mobility Restrictions Weight Bearing Restrictions: No General:    Therapy/Group: Individual Therapy  Zeba Luby R [place] 03/09/2021, 6:50 AM

## 2021-03-09 NOTE — Progress Notes (Addendum)
PROGRESS NOTE   Subjective/Complaints: Pt up in bed. Therapy in early to get started with him. Bilateral knee pain still as well as headache  ROS: Limited due to cognitive/behavioral   Objective:   No results found. Recent Labs    03/07/21 0602  WBC 6.9  HGB 13.6  HCT 39.8  PLT 188    Recent Labs    03/07/21 0602  NA 137  K 3.9  CL 105  CO2 24  GLUCOSE 97  BUN 19  CREATININE 1.09  CALCIUM 8.8*     Intake/Output Summary (Last 24 hours) at 03/09/2021 7341 Last data filed at 03/08/2021 1840 Gross per 24 hour  Intake 240 ml  Output --  Net 240 ml     Pressure Injury 02/26/21 Coccyx Mid;Posterior Stage 2 -  Partial thickness loss of dermis presenting as a shallow open injury with a red, pink wound bed without slough. (Active)  02/26/21 1702  Location: Coccyx  Location Orientation: Mid;Posterior  Staging: Stage 2 -  Partial thickness loss of dermis presenting as a shallow open injury with a red, pink wound bed without slough.  Wound Description (Comments):   Present on Admission: Yes    Physical Exam: Vital Signs Blood pressure 116/65, pulse 63, temperature 98.4 F (36.9 C), temperature source Oral, resp. rate 16, height 6' (1.829 m), weight 69.3 kg, SpO2 98 %.  Constitutional: No distress . Vital signs reviewed. HEENT: NCAT, EOMI, oral membranes moist Neck: supple Cardiovascular: RRR without murmur. No JVD    Respiratory/Chest: CTA Bilaterally without wheezes or rales. Normal effort    GI/Abdomen: BS +, non-tender, non-distended Ext: no clubbing, cyanosis, or edema Psych: flat but cooperative Skin: lower sacral at midline Stage 1 with tiny 84mm sacral stage 2--stable to improved Neurologic: Cranial nerves II through XII intact, motor strength is 5/5 in bilateral deltoid, bicep, tricep, grip, hip flexor, knee extensors, ankle dorsiflexor and plantar flexor Sensory exam normal for light touch and pain  in all 4 limbs. No limb ataxia or cerebellar signs. No abnormal tone appreciated.   Musculoskeletal: mild knee pain with AROM R>L   Assessment/Plan: 1. Functional deficits which require 3+ hours per day of interdisciplinary therapy in a comprehensive inpatient rehab setting. Physiatrist is providing close team supervision and 24 hour management of active medical problems listed below. Physiatrist and rehab team continue to assess barriers to discharge/monitor patient progress toward functional and medical goals  Care Tool:  Bathing    Body parts bathed by patient: Right arm, Left arm, Chest, Abdomen, Front perineal area, Right upper leg, Left upper leg, Right lower leg, Left lower leg, Face, Buttocks   Body parts bathed by helper: Buttocks     Bathing assist Assist Level: Contact Guard/Touching assist     Upper Body Dressing/Undressing Upper body dressing   What is the patient wearing?: Pull over shirt    Upper body assist Assist Level: Set up assist    Lower Body Dressing/Undressing Lower body dressing      What is the patient wearing?: Pants, Underwear/pull up     Lower body assist Assist for lower body dressing: Contact Guard/Touching assist     Toileting Toileting Toileting  Activity did not occur (Clothing management and hygiene only): N/A (no void or bm)  Toileting assist Assist for toileting: Contact Guard/Touching assist     Transfers Chair/bed transfer  Transfers assist     Chair/bed transfer assist level: Contact Guard/Touching assist Chair/bed transfer assistive device: Programmer, multimedia   Ambulation assist      Assist level: Contact Guard/Touching assist Assistive device: Walker-rolling Max distance: 30 ft   Walk 10 feet activity   Assist     Assist level: Minimal Assistance - Patient > 75% Assistive device: Walker-rolling   Walk 50 feet activity   Assist Walk 50 feet with 2 turns activity did not occur:  Safety/medical concerns (decreased activity tolerance)         Walk 150 feet activity   Assist Walk 150 feet activity did not occur: Safety/medical concerns         Walk 10 feet on uneven surface  activity   Assist     Assist level: Minimal Assistance - Patient > 75% Assistive device: Walker-rolling   Wheelchair     Assist Is the patient using a wheelchair?: Yes Type of Wheelchair: Manual    Wheelchair assist level: Minimal Assistance - Patient > 75% Max wheelchair distance: 55 ft    Wheelchair 50 feet with 2 turns activity    Assist        Assist Level: Minimal Assistance - Patient > 75%   Wheelchair 150 feet activity     Assist      Assist Level: Total Assistance - Patient < 25%   Blood pressure 116/65, pulse 63, temperature 98.4 F (36.9 C), temperature source Oral, resp. rate 16, height 6' (1.829 m), weight 69.3 kg, SpO2 98 %.  Medical Problem List and Plan:   SAH/IPH due to hypertensive emergency- with impaired ADLs/mobility  -Continue CIR therapies including PT, OT, and SLP   -spoke with son extensively yesterday. They will have help for him at home . DVT Prophylaxis/Anticoagulation: Mechanical: Sequential compression devices, below knee Bilateral lower extremities due to brain bleed 2. Pain Management/Headaches: Norco for HA as needed and tylenol prn-    -11/20 topamax stopped by PA dt lethargy  -11/23 pt still reporting headache although they're improving. Will stay with scheduled tylenol as only schedule med for now as he didn't tolerate topamax well (sedation)  -knee pain. Consider knee brace at least for RLE. In speaking with son, he was in need of a knee replacement prior to this hospitalization but wasn't a candidate d/t medical   -?consider knee injection 3. Mood/sleep: emotional support- and monitor if needs  neuropsychology.   -antipsychotics: none  - sleep chart  -continue scheduled trazodone at night  Telemetry sitter  for safety  11/23 more alert in general 4. Neuropsych- the patient is not  capable of making decisions for himself.  5. Skin/Wounds- routine wound care. Mainly with lower sacral stage one, tiny 38mm stage 2 open area , cont foam dressing  6. Fluids/Electrolytes/Nutrition: eating next to nothing  -encourage PO  -cognitive component 7. HTN in setting of hypertensive emergency with orthostasis-   -entresto,  and aldactone  Coreg DC'd given heart rate, prn clonidine d'ced  Scheduled hydralazine DC'd given blood pressure drops but cont prn hydralazine    Vitals:   03/09/21 0318 03/09/21 0816  BP: 116/65   Pulse: 63   Resp: 16   Temp: 98.4 F (36.9 C)   SpO2: 92% 98%    11/23 bp's overall improving. This  morning's SBP elevated but isolated--observe  8. CHF with EF 40-45%- con't Entresto and monitor   Filed Weights   03/07/21 0304 03/08/21 0500 03/09/21 0448  Weight: 69.5 kg 69.5 kg 69.3 kg   Monitor weights. Need these done consistently 9. COPD- con't O2 and wean as tolerated-also continue his regimen of COPD meds- including Pulmicort, Albuterol prn, Brovana. And change as required.  10. Constipation-   11/23 had large bm yesterday after sorbitol  -senna-s at HS scheduled 11. UTI- resolved- s/p IV rocephin 12. Smoker- tobacco- abuse- counseling and Nicoderm 21 mg daily 14. Hx of prostate CA s/p XRT seeding 16. GERD- con't protonix and monitor/change as required 17. AAA/CAD/HLD- con't to monitor   LOS: 11 days A FACE TO FACE EVALUATION WAS PERFORMED  Meredith Staggers 03/09/2021, 9:14 AM

## 2021-03-09 NOTE — Progress Notes (Signed)
Patient's acetaminophen administration states that patient has exceeded 4gms for today.  Note sent to provider for intervention.  New orders noted to continue to give acetaminophen with a total dose of 4gms daily which will include scheduled and PRN doses.  Also, Norco was increased to 10/325 every 4 hrs. This is also included in the total daily dose of acetaminophen of 4gms.

## 2021-03-10 DIAGNOSIS — I619 Nontraumatic intracerebral hemorrhage, unspecified: Secondary | ICD-10-CM | POA: Diagnosis not present

## 2021-03-10 MED ORDER — VALPROIC ACID 250 MG PO CAPS
250.0000 mg | ORAL_CAPSULE | Freq: Two times a day (BID) | ORAL | Status: DC
  Administered 2021-03-10 – 2021-03-16 (×13): 250 mg via ORAL
  Filled 2021-03-10 (×14): qty 1

## 2021-03-10 NOTE — Progress Notes (Signed)
PROGRESS NOTE   Subjective/Complaints: No issues overnite except persistent HA   ROS: Limited due to cognitive/behavioral   Objective:   No results found. No results for input(s): WBC, HGB, HCT, PLT in the last 72 hours.   No results for input(s): NA, K, CL, CO2, GLUCOSE, BUN, CREATININE, CALCIUM in the last 72 hours.    Intake/Output Summary (Last 24 hours) at 03/10/2021 0929 Last data filed at 03/10/2021 0700 Gross per 24 hour  Intake 280 ml  Output --  Net 280 ml      Pressure Injury 02/26/21 Coccyx Mid;Posterior Stage 2 -  Partial thickness loss of dermis presenting as a shallow open injury with a red, pink wound bed without slough. (Active)  02/26/21 1702  Location: Coccyx  Location Orientation: Mid;Posterior  Staging: Stage 2 -  Partial thickness loss of dermis presenting as a shallow open injury with a red, pink wound bed without slough.  Wound Description (Comments):   Present on Admission: Yes    Physical Exam: Vital Signs Blood pressure (!) 158/73, pulse 60, temperature 97.9 F (36.6 C), temperature source Oral, resp. rate 16, height 6' (1.829 m), weight 69.4 kg, SpO2 94 %.   General: No acute distress Mood and affect are appropriate Heart: Regular rate and rhythm no rubs murmurs or extra sounds Lungs: Clear to auscultation, breathing unlabored, no rales or wheezes Abdomen: Positive bowel sounds, soft nontender to palpation, nondistended Extremities: No clubbing, cyanosis, or edema Skin: No evidence of breakdown, no evidence of rash   EOMI Neurologic: Cranial nerves II through XII intact, motor strength is 5/5 in bilateral deltoid, bicep, tricep, grip, hip flexor, knee extensors, ankle dorsiflexor and plantar flexor Sensory exam normal for light touch and pain in all 4 limbs. No limb ataxia or cerebellar signs. No abnormal tone appreciated.   Musculoskeletal: mild knee pain with AROM  R>L   Assessment/Plan: 1. Functional deficits which require 3+ hours per day of interdisciplinary therapy in a comprehensive inpatient rehab setting. Physiatrist is providing close team supervision and 24 hour management of active medical problems listed below. Physiatrist and rehab team continue to assess barriers to discharge/monitor patient progress toward functional and medical goals  Care Tool:  Bathing    Body parts bathed by patient: Right arm, Left arm, Chest, Abdomen, Front perineal area, Right upper leg, Left upper leg, Right lower leg, Left lower leg, Face, Buttocks   Body parts bathed by helper: Buttocks     Bathing assist Assist Level: Contact Guard/Touching assist     Upper Body Dressing/Undressing Upper body dressing   What is the patient wearing?: Pull over shirt    Upper body assist Assist Level: Set up assist    Lower Body Dressing/Undressing Lower body dressing      What is the patient wearing?: Pants, Underwear/pull up     Lower body assist Assist for lower body dressing: Contact Guard/Touching assist     Toileting Toileting Toileting Activity did not occur (Clothing management and hygiene only): N/A (no void or bm)  Toileting assist Assist for toileting: Contact Guard/Touching assist     Transfers Chair/bed transfer  Transfers assist     Chair/bed transfer assist level:  Contact Guard/Touching assist Chair/bed transfer assistive device: Museum/gallery exhibitions officer assist      Assist level: Contact Guard/Touching assist Assistive device: Walker-rolling Max distance: 30 ft   Walk 10 feet activity   Assist     Assist level: Minimal Assistance - Patient > 75% Assistive device: Walker-rolling   Walk 50 feet activity   Assist Walk 50 feet with 2 turns activity did not occur: Safety/medical concerns (decreased activity tolerance)         Walk 150 feet activity   Assist Walk 150 feet activity did not  occur: Safety/medical concerns         Walk 10 feet on uneven surface  activity   Assist     Assist level: Minimal Assistance - Patient > 75% Assistive device: Walker-rolling   Wheelchair     Assist Is the patient using a wheelchair?: Yes Type of Wheelchair: Manual    Wheelchair assist level: Minimal Assistance - Patient > 75% Max wheelchair distance: 55 ft    Wheelchair 50 feet with 2 turns activity    Assist        Assist Level: Minimal Assistance - Patient > 75%   Wheelchair 150 feet activity     Assist      Assist Level: Total Assistance - Patient < 25%   Blood pressure (!) 158/73, pulse 60, temperature 97.9 F (36.6 C), temperature source Oral, resp. rate 16, height 6' (1.829 m), weight 69.4 kg, SpO2 94 %.  Medical Problem List and Plan:   SAH/IPH due to hypertensive emergency- with impaired ADLs/mobility  -Continue CIR therapies including PT, OT, and SLP   -spoke with son extensively yesterday. They will have help for him at home . DVT Prophylaxis/Anticoagulation: Mechanical: Sequential compression devices, below knee Bilateral lower extremities due to brain bleed 2. Pain Management/Headaches: Norco for HA as needed and tylenol prn-    -11/20 topamax stopped by PA dt lethargy  -11/23 pt still reporting headache although they're improving. Will stay with scheduled tylenol as only schedule med for now as he didn't tolerate topamax well (sedation)- 11/24 pt states tylenol not helping HA , trial depakote given problems with topirimate   -knee pain. Consider knee brace at least for RLE. In speaking with son, he was in need of a knee replacement prior to this hospitalization but wasn't a candidate d/t medical- diclofenac gel   -?consider knee injection 3. Mood/sleep: emotional support- and monitor if needs  neuropsychology.   -antipsychotics: none  - sleep chart  -continue scheduled trazodone at night  Telemetry sitter for safety  11/23 more  alert in general 4. Neuropsych- the patient is not  capable of making decisions for himself.  5. Skin/Wounds- routine wound care. Mainly with lower sacral stage one, tiny 24mm stage 2 open area , cont foam dressing  6. Fluids/Electrolytes/Nutrition: eating next to nothing  -encourage PO  -cognitive component 7. HTN in setting of hypertensive emergency with orthostasis-   -entresto,  and aldactone  Coreg DC'd given heart rate, prn clonidine d'ced  Scheduled hydralazine DC'd given blood pressure drops but cont prn hydralazine    Vitals:   03/09/21 2213 03/10/21 0532  BP: 138/84 (!) 158/73  Pulse: 62 60  Resp: 16 16  Temp: 98.4 F (36.9 C) 97.9 F (36.6 C)  SpO2: 94% 94%    11/23 bp's overall improving. This morning's SBP elevated but isolated--observe  8. CHF with EF 40-45%- con't Entresto and monitor   [employer]  03/08/21 0500 03/09/21 0448 03/10/21 0532  Weight: 69.5 kg 69.3 kg 69.4 kg   Monitor weights. Need these done consistently 9. COPD- con't O2 and wean as tolerated-also continue his regimen of COPD meds- including Pulmicort, Albuterol prn, Brovana. And change as required.  10. Constipation-   11/23 had large bm yesterday after sorbitol  -senna-s at HS scheduled 11. UTI- resolved- s/p IV rocephin 12. Smoker- tobacco- abuse- counseling and Nicoderm 21 mg daily 14. Hx of prostate CA s/p XRT seeding 16. GERD- con't protonix and monitor/change as required 17. AAA/CAD/HLD- con't to monitor   LOS: 12 days A FACE TO FACE EVALUATION WAS PERFORMED  Charlett Blake 03/10/2021, 9:29 AM

## 2021-03-10 NOTE — Progress Notes (Signed)
Bowel program started. Dig stim performed. No stool returned. Suppository inserted. Pt reminded to call when ready. Call light in reach. Handoff given to night nurse. Sheela Stack, LPN

## 2021-03-11 DIAGNOSIS — I619 Nontraumatic intracerebral hemorrhage, unspecified: Secondary | ICD-10-CM | POA: Diagnosis not present

## 2021-03-11 NOTE — Progress Notes (Signed)
Physical Therapy Session Note  Patient Details  Name: [name] MRN: [id number] Date of Birth: Aug 07, 1945  Today's Date: 03/11/2021 PT Individual Time: 1100-1130 PT Individual Time Calculation (min): 30 min  and Today's Date: 03/11/2021 PT Missed Time: 30 Minutes Missed Time Reason: Other (Comment) (Eating with family)  Short Term Goals: Week 2:  PT Short Term Goal 1 (Week 2): Pt will ambulate 100' with CGA and LRAD. PT Short Term Goal 2 (Week 2): Pt willi mprove BERG score by MCID >8 points.  Skilled Therapeutic Interventions/Progress Updates:     Pt received seated in [facility] and agrees to therapy. No complaint of pain. Pt's 3 sons present for family education. PT educates family on pt's status and PT recommendation for 24 hour supervision as well as CGA when pt is ambulating. WC transport to gym for time management. Pt performs sit to stand to RW with cues for hand placement and body mechanics. Pt ambulates x20' to car and performs car transfer with CGA and cues for sequencing. Family verbalizes understanding. Following seated rest break, pt ambulates x200' with pt's son providing CGA, with PT providing cues to pt and family on importance of upright gaze to improve posture and balance, and increasing stride length and gait speed to decrease risk for falls.   Pt with plans to eat lunch with wife and sons. Pt misses 30 minutes of skilled PT to visit with family. Left in wife's room with family present.  Therapy Documentation Precautions:  Precautions Precautions: Fall Precaution Comments: Labile BP, monitor with mobility Restrictions Weight Bearing Restrictions: No    Therapy/Group: Individual Therapy  Breck Coons, PT, DPT 03/11/2021, 2:16 PM

## 2021-03-11 NOTE — Progress Notes (Signed)
Occupational Therapy Session Note  Patient Details  Name: [name] MRN: [id number] Date of Birth: 21-Aug-1945  Today's Date: 03/11/2021 Session 1 OT Individual Time: 6144-3154 OT Individual Time Calculation (min): 56 min   Session 2 OT Individual Time: 1000-1055 OT Individual Time Calculation (min): 55 min    Short Term Goals: Week 2:  OT Short Term Goal 1 (Week 2): Patient will initiate bed mobility with min cues OT Short Term Goal 2 (Week 2): Patient will maintain standing balance with CGA within BADL task OT Short Term Goal 3 (Week 2): Patient will complete LB dressing with CGA  Skilled Therapeutic Interventions/Progress Updates:    Session 1 Pt greeted sitting upright in bed eating breakfast and agreeable to OT treatment session. Pt able to self feed with correct use of feeding utensils with mild distractibility, but able to return attention to food consumption without cues. Pt then agreeable to shower. Pt completed bed mobility with improved initiation and minimal cues. Sit<>stand w./ RW and CGA with verbal cues for hand placement. Pt ambulated into bathroom w/ RW and close supervision, sat on commode and voided bladder. CGA to then transfer into shower onto tub bench without AD. Bathing completed at overall supervision level with min cues for safety awareness. Dressing tasks completed sit<>stand from EOB with 1 posterior LOB requiring CGA to correct when standing to pull up pants. Pt continues to have low endurance, requiring multiple rest breaks within BADL tasks. Pt left semi-reclined in bed at end of session with bed alarm on, call bell in reach, and needs met.  4/10 headache, rest and repositioned  Session 2 Pt greeted semi-reclined in bed and agreeable to OT treatment session. Pt faster to initiate with supervision. Pt stood with RW, min cues and CGA. Pt ambulated into bathroom and transferred onto commode with supervision. Pt voided bladder and pulled up clothing with  supervision. 3 of patients sons then entered the room for family education. OT educated on BADL performance, safety awareness, energy conservation techniques, and modifications for BADL's in home environment. Pt then brought down to therapy gym and walk-in shower was set-up to simulate home.OT educated on providing supervision level of care and how to cue patient. Shower transfer with supervision and verba cues for technique to side step. Pt ambulated from therapy gym, up elevators, and to nurses station upstairs before reaching max fatigue. OT discussed DME needs and recommendation for HHOT at dc. Pt left seated in wc with family present and needs met.  Pt reported headache improved, no number given.    Therapy Documentation Precautions:  Precautions Precautions: Fall Precaution Comments: Labile BP, monitor with mobility Restrictions Weight Bearing Restrictions: No Pain: Pain Assessment Pain Scale: 0-10 Pain Score: 0-No pain Faces Pain Scale: No hurt    Therapy/Group: Individual Therapy  Valma Cava 03/11/2021, 10:58 AM

## 2021-03-11 NOTE — Progress Notes (Signed)
Speech Language Pathology Daily Session Note  Patient Details  Name: [name] MRN: [id number] Date of Birth: 11-19-1945  Today's Date: 03/11/2021 SLP Individual Time: 1410-1435 SLP Individual Time Calculation (min): 25 min  Short Term Goals: Week 2: SLP Short Term Goal 1 (Week 2): Patient will demonstrate complex problem solving for functional tasks with Min verbal cues. SLP Short Term Goal 2 (Week 2): Patient will recall new, daily information with supervision level verbal cues. SLP Short Term Goal 3 (Week 2): Patient will self-monitor and correct errors during functional tasks with supervision verbal cues. SLP Short Term Goal 4 (Week 2): Patient will demonstrate sustained attention to functional tasks for 45 minutes with supervision verbal cues for redirection.  Skilled Therapeutic Interventions: Skilled treatment session focused on cognitive goals. Patient missed initial 10 minutes of session due to being off unit in wife's room. SLP facilitated session by providing overall Mod A multimodal cues for use of external aid for recall of his current medication and their functions. Max A multimodal cues were needed for functional problem solving while organizing a BID pill box. Patient mildly lethargic throughout session but sustained appropriate attention throughout Patient left upright in wheelchair with alarm on and all needs within reach. Continue with current plan of care.      Pain No/Denies pain   Therapy/Group: Individual Therapy  Evalie Hargraves, [place] 03/11/2021, 3:09 PM

## 2021-03-11 NOTE — Progress Notes (Signed)
PROGRESS NOTE   Subjective/Complaints:  Improvement of HA with Valproic acid  No breathing issue getting nebulizer with RT   ROS: Limited due to cognitive/behavioral   Objective:   No results found. No results for input(s): WBC, HGB, HCT, PLT in the last 72 hours.   No results for input(s): NA, K, CL, CO2, GLUCOSE, BUN, CREATININE, CALCIUM in the last 72 hours.    Intake/Output Summary (Last 24 hours) at 03/11/2021 0754 Last data filed at 03/11/2021 0747 Gross per 24 hour  Intake 540 ml  Output --  Net 540 ml      Pressure Injury 02/26/21 Coccyx Mid;Posterior Stage 2 -  Partial thickness loss of dermis presenting as a shallow open injury with a red, pink wound bed without slough. (Active)  02/26/21 1702  Location: Coccyx  Location Orientation: Mid;Posterior  Staging: Stage 2 -  Partial thickness loss of dermis presenting as a shallow open injury with a red, pink wound bed without slough.  Wound Description (Comments):   Present on Admission: Yes    Physical Exam: Vital Signs Blood pressure (!) 141/76, pulse 60, temperature 98 F (36.7 C), temperature source Oral, resp. rate 15, height 6' (1.829 m), weight 70.2 kg, SpO2 97 %.   General: No acute distress Mood and affect are appropriate Heart: Regular rate and rhythm no rubs murmurs or extra sounds Lungs: Clear to auscultation, breathing unlabored, no rales or wheezes Abdomen: Positive bowel sounds, soft nontender to palpation, nondistended Extremities: No clubbing, cyanosis, or edema Skin: No evidence of breakdown, no evidence of rash   EOMI Neurologic: Cranial nerves II through XII intact, motor strength is 5/5 in bilateral deltoid, bicep, tricep, grip, hip flexor, knee extensors, ankle dorsiflexor and plantar flexor Sensory exam normal for light touch and pain in all 4 limbs. No limb ataxia or cerebellar signs. No abnormal tone appreciated.    Musculoskeletal: mild knee pain with AROM R>L   Assessment/Plan: 1. Functional deficits which require 3+ hours per day of interdisciplinary therapy in a comprehensive inpatient rehab setting. Physiatrist is providing close team supervision and 24 hour management of active medical problems listed below. Physiatrist and rehab team continue to assess barriers to discharge/monitor patient progress toward functional and medical goals  Care Tool:  Bathing    Body parts bathed by patient: Right arm, Left arm, Chest, Abdomen, Front perineal area, Right upper leg, Left upper leg, Right lower leg, Left lower leg, Face, Buttocks   Body parts bathed by helper: Buttocks     Bathing assist Assist Level: Supervision/Verbal cueing     Upper Body Dressing/Undressing Upper body dressing   What is the patient wearing?: Pull over shirt    Upper body assist Assist Level: Supervision/Verbal cueing    Lower Body Dressing/Undressing Lower body dressing      What is the patient wearing?: Pants, Underwear/pull up     Lower body assist Assist for lower body dressing: Contact Guard/Touching assist     Toileting Toileting Toileting Activity did not occur (Clothing management and hygiene only): N/A (no void or bm)  Toileting assist Assist for toileting: Contact Guard/Touching assist     Transfers Chair/bed transfer  Transfers assist  Chair/bed transfer assist level: Contact Guard/Touching assist Chair/bed transfer assistive device: Museum/gallery exhibitions officer assist      Assist level: Contact Guard/Touching assist Assistive device: Walker-rolling Max distance: 30 ft   Walk 10 feet activity   Assist     Assist level: Minimal Assistance - Patient > 75% Assistive device: Walker-rolling   Walk 50 feet activity   Assist Walk 50 feet with 2 turns activity did not occur: Safety/medical concerns (decreased activity tolerance)         Walk 150 feet  activity   Assist Walk 150 feet activity did not occur: Safety/medical concerns         Walk 10 feet on uneven surface  activity   Assist     Assist level: Minimal Assistance - Patient > 75% Assistive device: Walker-rolling   Wheelchair     Assist Is the patient using a wheelchair?: Yes Type of Wheelchair: Manual    Wheelchair assist level: Minimal Assistance - Patient > 75% Max wheelchair distance: 55 ft    Wheelchair 50 feet with 2 turns activity    Assist        Assist Level: Minimal Assistance - Patient > 75%   Wheelchair 150 feet activity     Assist      Assist Level: Total Assistance - Patient < 25%   Blood pressure (!) 141/76, pulse 60, temperature 98 F (36.7 C), temperature source Oral, resp. rate 15, height 6' (1.829 m), weight 70.2 kg, SpO2 97 %.  Medical Problem List and Plan:   SAH/IPH due to hypertensive emergency- with impaired ADLs/mobility  -Continue CIR therapies including PT, OT, and SLP   -spoke with son extensively yesterday. They will have help for him at home . DVT Prophylaxis/Anticoagulation: Mechanical: Sequential compression devices, below knee Bilateral lower extremities due to brain bleed 2. Pain Management/Headaches: Norco for HA as needed and tylenol prn-    -11/20 topamax stopped by PA dt lethargy  -11/23 pt still reporting headache although they're improving. Will stay with scheduled tylenol as only schedule med for now as he didn't tolerate topamax well (sedation)- 11/24 pt states tylenol not helping HA , trial depakote given problems with topirimate   -knee pain. Consider knee brace at least for RLE. In speaking with son, he was in need of a knee replacement prior to this hospitalization but wasn't a candidate d/t medical- diclofenac gel- no knee pain c/o today     3. Mood/sleep: emotional support- and monitor if needs  neuropsychology.   -antipsychotics: none  - sleep chart  -continue scheduled trazodone at  night  Telemetry sitter for safety  11/23 more alert in general 4. Neuropsych- the patient is not  capable of making decisions for himself.  5. Skin/Wounds- routine wound care. Mainly with lower sacral stage one, tiny 46mm stage 2 open area , cont foam dressing  6. Fluids/Electrolytes/Nutrition: eating next to nothing  -encourage PO  -cognitive component 7. HTN in setting of hypertensive emergency with orthostasis-   -entresto,  and aldactone  Coreg DC'd given heart rate, prn clonidine d'ced  Scheduled hydralazine DC'd given blood pressure drops but cont prn hydralazine    Vitals:   03/10/21 1955 03/11/21 0457  BP: 109/60 (!) 141/76  Pulse: (!) 51 60  Resp: 17 15  Temp: 98.6 F (37 C) 98 F (36.7 C)  SpO2: 93% 97%    Control improving still some lability   8. CHF with EF 40-45%- con't Entresto and  aldactone .   Filed Weights   03/09/21 0448 03/10/21 0532 03/11/21 0457  Weight: 69.3 kg 69.4 kg 70.2 kg   Monitor weights. Need these done consistently 9. COPD- con't O2 and wean as tolerated-also continue his regimen of COPD meds- including Pulmicort, Albuterol prn, Brovana. And change as required.  10. Constipation-   11/23 had large bm yesterday after sorbitol  -senna-s at HS scheduled 11. UTI- resolved- s/p IV rocephin 12. Smoker- tobacco- abuse- counseling and Nicoderm 21 mg daily 14. Hx of prostate CA s/p XRT seeding 16. GERD- con't protonix and monitor/change as required 17. AAA/CAD/HLD- con't to monitor   LOS: 13 days A FACE TO [place] [facility] Olly Shiner 03/11/2021, 7:54 AM

## 2021-03-11 NOTE — Progress Notes (Deleted)
Not notified that pt was a bowel protocol. Informed by pt at midnight. Pt did have a large soft brown BM.

## 2021-03-11 NOTE — Progress Notes (Addendum)
Patient ID: [name], male   DOB: 1945-12-25, 75 y.o.   MRN: [id number]  HHPT/OT/SLP/aide referral accepted by [facility].   *SW left Medicaid application in wife's room since the son [name] will be meeting with his mother for family edu this afternoon.  Loralee Pacas, MSW, [place] Office: [phone] Cell: [phone] Fax: [phone]

## 2021-03-11 NOTE — Discharge Instructions (Addendum)
  Inpatient Rehab Discharge Instructions  [name] Discharge date and time: 03/16/21   Activities/Precautions/ Functional Status: Activity: no lifting, driving, or strenuous exercise till cleared by MD Diet: cardiac diet Wound Care: keep wound clean and dry   Functional status:  ___ No restrictions     ___ Walk up steps independently _X__ 24/7 supervision/assistance   ___ Walk up steps with assistance ___ Intermittent supervision/assistance  ___ Bathe/dress independently ___ Walk with walker     ___ Bathe/dress with assistance ___ Walk Independently    ___ Shower independently ___ Walk with assistance    __X_ Shower with assistance __X_ No alcohol     ___ Return to work/school ________  Special Instructions: No aspirin, aleve/ibuprofen/motrin. Can use tylenol as needed.  2. Do not use tramadol--you have not been using it in the hospital.   [place]:    Home Health:   PT     OT     ST    SNA                    Agency: [facility]        Phone: [phone] *Please expect follow-up within 2-3 days to schedule your home visit. If you have not received follow-up, be sure to contact the branch directly.*   Medical Equipment/Items Ordered: rolling walker and 3in1 bedside commode                                                 Agency/Supplier: [facility] [phone]    My questions have been answered and I understand these instructions. I will adhere to these goals and the provided educational materials after my discharge from the hospital.  Patient/Caregiver Signature _______________________________ Date __________  Clinician Signature _______________________________________ Date __________  Please bring this form and your medication list with you to all your follow-up doctor's appointments.

## 2021-03-12 DIAGNOSIS — I619 Nontraumatic intracerebral hemorrhage, unspecified: Secondary | ICD-10-CM | POA: Diagnosis not present

## 2021-03-12 MED ORDER — FUROSEMIDE 20 MG PO TABS
10.0000 mg | ORAL_TABLET | Freq: Once | ORAL | Status: AC
  Administered 2021-03-12: 10 mg via ORAL
  Filled 2021-03-12: qty 1

## 2021-03-12 NOTE — Progress Notes (Signed)
PROGRESS NOTE   Subjective/Complaints: Sleepy Appreciate nursing note, telesitter d/ced as behavior has been more appropriate this weekend No issues overnight  ROS: Limited due to cognitive/behavioral   Objective:   No results found. No results for input(s): WBC, HGB, HCT, PLT in the last 72 hours.   No results for input(s): NA, K, CL, CO2, GLUCOSE, BUN, CREATININE, CALCIUM in the last 72 hours.    Intake/Output Summary (Last 24 hours) at 03/12/2021 1737 Last data filed at 03/12/2021 1300 Gross per 24 hour  Intake 280 ml  Output --  Net 280 ml     Pressure Injury 02/26/21 Coccyx Mid;Posterior Stage 2 -  Partial thickness loss of dermis presenting as a shallow open injury with a red, pink wound bed without slough. (Active)  02/26/21 1702  Location: Coccyx  Location Orientation: Mid;Posterior  Staging: Stage 2 -  Partial thickness loss of dermis presenting as a shallow open injury with a red, pink wound bed without slough.  Wound Description (Comments):   Present on Admission: Yes    Physical Exam: Vital Signs Blood pressure (!) 150/70, pulse 60, temperature 98 F (36.7 C), resp. rate 17, height 6' (1.829 m), weight 71.1 kg, SpO2 92 %. Gen: no distress, normal appearing HEENT: oral mucosa pink and moist, NCAT Cardio: Reg rate Chest: normal effort, normal rate of breathing Abd: soft, non-distended Ext: no edema Psych: pleasant, normal affect Skin: intact Neuro:  EOMI Neurologic: Cranial nerves II through XII intact, motor strength is 5/5 in bilateral deltoid, bicep, tricep, grip, hip flexor, knee extensors, ankle dorsiflexor and plantar flexor Sensory exam normal for light touch and pain in all 4 limbs. No limb ataxia or cerebellar signs. No abnormal tone appreciated.   Musculoskeletal: mild knee pain with AROM R>L   Assessment/Plan: 1. Functional deficits which require 3+ hours per day of  interdisciplinary therapy in a comprehensive inpatient rehab setting. Physiatrist is providing close team supervision and 24 hour management of active medical problems listed below. Physiatrist and rehab team continue to assess barriers to discharge/monitor patient progress toward functional and medical goals  Care Tool:  Bathing    Body parts bathed by patient: Right arm, Left arm, Chest, Abdomen, Front perineal area, Right upper leg, Left upper leg, Right lower leg, Left lower leg, Face, Buttocks   Body parts bathed by helper: Buttocks     Bathing assist Assist Level: Supervision/Verbal cueing     Upper Body Dressing/Undressing Upper body dressing   What is the patient wearing?: Pull over shirt    Upper body assist Assist Level: Supervision/Verbal cueing    Lower Body Dressing/Undressing Lower body dressing      What is the patient wearing?: Pants, Underwear/pull up     Lower body assist Assist for lower body dressing: Contact Guard/Touching assist     Toileting Toileting Toileting Activity did not occur (Clothing management and hygiene only): N/A (no void or bm)  Toileting assist Assist for toileting: Contact Guard/Touching assist     Transfers Chair/bed transfer  Transfers assist     Chair/bed transfer assist level: Contact Guard/Touching assist Chair/bed transfer assistive device: Programmer, multimedia   Ambulation assist  Assist level: Contact Guard/Touching assist Assistive device: Walker-rolling Max distance: 30 ft   Walk 10 feet activity   Assist     Assist level: Minimal Assistance - Patient > 75% Assistive device: Walker-rolling   Walk 50 feet activity   Assist Walk 50 feet with 2 turns activity did not occur: Safety/medical concerns (decreased activity tolerance)         Walk 150 feet activity   Assist Walk 150 feet activity did not occur: Safety/medical concerns         Walk 10 feet on uneven surface   activity   Assist     Assist level: Minimal Assistance - Patient > 75% Assistive device: Walker-rolling   Wheelchair     Assist Is the patient using a wheelchair?: Yes Type of Wheelchair: Manual    Wheelchair assist level: Minimal Assistance - Patient > 75% Max wheelchair distance: 55 ft    Wheelchair 50 feet with 2 turns activity    Assist        Assist Level: Minimal Assistance - Patient > 75%   Wheelchair 150 feet activity     Assist      Assist Level: Total Assistance - Patient < 25%   Blood pressure (!) 150/70, pulse 60, temperature 98 F (36.7 C), resp. rate 17, height 6' (1.829 m), weight 71.1 kg, SpO2 92 %.  Medical Problem List and Plan:   SAH/IPH due to hypertensive emergency- with impaired ADLs/mobility  Continue CIR therapies including PT, OT, and SLP   2. Impaired mobility: continue Sequential compression devices, below knee Bilateral lower extremities due to brain bleed 3. Headaches: Continue Norco for HA as needed and tylenol prn-    -11/20 topamax stopped by PA dt lethargy  -11/23 pt still reporting headache although they're improving. Will stay with scheduled tylenol as only schedule med for now as he didn't tolerate topamax well (sedation)- 11/24 pt states tylenol not helping HA , trial depakote given problems with topirimate     3. Mood/sleep: emotional support- and monitor if needs  neuropsychology.   -antipsychotics: none  - sleep chart  -continue scheduled trazodone at night  Telemetry sitter for safety d/ced   11/23 more alert in general 4. Neuropsych- the patient is not  capable of making decisions for himself.  5. Skin/Wounds- routine wound care. Mainly with lower sacral stage one, tiny 30mm stage 2 open area , cont foam dressing  6. Fluids/Electrolytes/Nutrition: eating next to nothing  -encourage PO  -cognitive component 7. HTN in setting of hypertensive emergency with orthostasis-   -entresto,  and aldactone  Coreg  DC'd given heart rate, prn clonidine d'ced  Scheduled hydralazine DC'd given blood pressure drops but cont prn hydralazine    Vitals:   03/12/21 0803 03/12/21 1320  BP:  (!) 150/70  Pulse:  60  Resp:  17  Temp:  98 F (36.7 C)  SpO2: 96% 92%    Control improving still some lability   8. CHF with EF 40-45%- con't Entresto and aldactone .   Filed Weights   03/10/21 0532 03/11/21 0457 03/12/21 0509  Weight: 69.4 kg 70.2 kg 71.1 kg   Monitor weights. Need these done consistently  11/26: weight and BP up: will give one time dose of Lasix 10mg .  9. COPD- con't O2 and wean as tolerated-also continue his regimen of COPD meds- including Pulmicort, Albuterol prn, Brovana. And change as required.  10. Constipation-   11/23 had large bm yesterday after sorbitol  -senna-s at  HS scheduled 11. UTI- resolved- s/p IV rocephin 12. Smoker- tobacco- abuse- counseling and Nicoderm 21 mg daily 14. Hx of prostate CA s/p XRT seeding 16. GERD- con't protonix and monitor/change as required 17. AAA/CAD/HLD- con't to monitor 18. Knee pain. Consider knee brace at least for RLE. In speaking with son, he was in need of a knee replacement prior to this hospitalization but wasn't a candidate d/t medical- diclofenac gel- no knee pain c/o today    LOS: 14 days A FACE TO FACE EVALUATION WAS PERFORMED  [name] 03/12/2021, 5:37 PM

## 2021-03-12 NOTE — Progress Notes (Signed)
Pt with appropriate behavior this weekend AEB, calling to be toileted , verbalizing the need to contact staff before attempting to get out of bed, no impulsive behaviors. Telesitter d/c'ed

## 2021-03-13 LAB — GLUCOSE, CAPILLARY: Glucose-Capillary: 103 mg/dL — ABNORMAL HIGH (ref 70–99)

## 2021-03-13 NOTE — Progress Notes (Signed)
Pt declined bowel program tonight. Pt states " it keeps him up all night running to the bathroom". Was not interested in suppository. Explained importance of bowel program.   Arn Medal, LPN

## 2021-03-14 DIAGNOSIS — I5042 Chronic combined systolic (congestive) and diastolic (congestive) heart failure: Secondary | ICD-10-CM | POA: Diagnosis not present

## 2021-03-14 DIAGNOSIS — E440 Moderate protein-calorie malnutrition: Secondary | ICD-10-CM | POA: Diagnosis not present

## 2021-03-14 DIAGNOSIS — I10 Essential (primary) hypertension: Secondary | ICD-10-CM | POA: Diagnosis not present

## 2021-03-14 DIAGNOSIS — I619 Nontraumatic intracerebral hemorrhage, unspecified: Secondary | ICD-10-CM | POA: Diagnosis not present

## 2021-03-14 DIAGNOSIS — K5901 Slow transit constipation: Secondary | ICD-10-CM | POA: Diagnosis not present

## 2021-03-14 LAB — CBC WITH DIFFERENTIAL/PLATELET
Abs Immature Granulocytes: 0.02 10*3/uL (ref 0.00–0.07)
Basophils Absolute: 0 10*3/uL (ref 0.0–0.1)
Basophils Relative: 1 %
Eosinophils Absolute: 0.2 10*3/uL (ref 0.0–0.5)
Eosinophils Relative: 3 %
HCT: 45.4 % (ref 39.0–52.0)
Hemoglobin: 15.3 g/dL (ref 13.0–17.0)
Immature Granulocytes: 0 %
Lymphocytes Relative: 17 %
Lymphs Abs: 1.3 10*3/uL (ref 0.7–4.0)
MCH: 31.6 pg (ref 26.0–34.0)
MCHC: 33.7 g/dL (ref 30.0–36.0)
MCV: 93.8 fL (ref 80.0–100.0)
Monocytes Absolute: 0.5 10*3/uL (ref 0.1–1.0)
Monocytes Relative: 7 %
Neutro Abs: 5.5 10*3/uL (ref 1.7–7.7)
Neutrophils Relative %: 72 %
Platelets: 204 10*3/uL (ref 150–400)
RBC: 4.84 MIL/uL (ref 4.22–5.81)
RDW: 12.7 % (ref 11.5–15.5)
WBC: 7.6 10*3/uL (ref 4.0–10.5)
nRBC: 0 % (ref 0.0–0.2)

## 2021-03-14 LAB — BASIC METABOLIC PANEL
Anion gap: 8 (ref 5–15)
BUN: 17 mg/dL (ref 8–23)
CO2: 26 mmol/L (ref 22–32)
Calcium: 9 mg/dL (ref 8.9–10.3)
Chloride: 104 mmol/L (ref 98–111)
Creatinine, Ser: 0.97 mg/dL (ref 0.61–1.24)
GFR, Estimated: >60 mL/min (ref >60)
Glucose, Bld: 109 mg/dL — ABNORMAL HIGH (ref 70–99)
Potassium: 3.8 mmol/L (ref 3.5–5.1)
Sodium: 138 mmol/L (ref 135–145)

## 2021-03-14 NOTE — Progress Notes (Signed)
PROGRESS NOTE   Subjective/Complaints: Pt still a bit sleepy but arouses. Denies pain today. Did sleep  ROS: Limited due to cognitive/behavioral   Objective:   No results found. Recent Labs    03/14/21 0711  WBC 7.6  HGB 15.3  HCT 45.4  PLT 204     Recent Labs    03/14/21 0711  NA 138  K 3.8  CL 104  CO2 26  GLUCOSE 109*  BUN 17  CREATININE 0.97  CALCIUM 9.0      Intake/Output Summary (Last 24 hours) at 03/14/2021 1311 Last data filed at 03/14/2021 0800 Gross per 24 hour  Intake 360 ml  Output --  Net 360 ml     Pressure Injury 02/26/21 Coccyx Mid;Posterior Stage 2 -  Partial thickness loss of dermis presenting as a shallow open injury with a red, pink wound bed without slough. (Active)  02/26/21 1702  Location: Coccyx  Location Orientation: Mid;Posterior  Staging: Stage 2 -  Partial thickness loss of dermis presenting as a shallow open injury with a red, pink wound bed without slough.  Wound Description (Comments):   Present on Admission: Yes    Physical Exam: Vital Signs Blood pressure 130/80, pulse (!) 58, temperature 98.4 F (36.9 C), temperature source Oral, resp. rate 16, height 6' (1.829 m), weight 70.8 kg, SpO2 96 %. Constitutional: No distress . Vital signs reviewed. HEENT: NCAT, EOMI, oral membranes moist Neck: supple Cardiovascular: RRR without murmur. No JVD    Respiratory/Chest: CTA Bilaterally without wheezes or rales. Normal effort    GI/Abdomen: BS +, non-tender, non-distended Ext: no clubbing, cyanosis, or edema Psych: flat but cooperates  Skin: intact Neurologic: fair insight, normal language. Follows commands. Cranial nerves II through XII intact, motor strength is 5/5 in bilateral deltoid, bicep, tricep, grip, hip flexor, knee extensors, ankle dorsiflexor and plantar flexor Sensory exam normal for light touch and pain in all 4 limbs. No limb ataxia or cerebellar signs. No  abnormal tone appreciated.   Musculoskeletal: mild knee pain with AROM R>L   Assessment/Plan: 1. Functional deficits which require 3+ hours per day of interdisciplinary therapy in a comprehensive inpatient rehab setting. Physiatrist is providing close team supervision and 24 hour management of active medical problems listed below. Physiatrist and rehab team continue to assess barriers to discharge/monitor patient progress toward functional and medical goals  Care Tool:  Bathing    Body parts bathed by patient: Right arm, Left arm, Chest, Abdomen, Front perineal area, Right upper leg, Left upper leg, Right lower leg, Left lower leg, Face, Buttocks   Body parts bathed by helper: Buttocks     Bathing assist Assist Level: Supervision/Verbal cueing     Upper Body Dressing/Undressing Upper body dressing   What is the patient wearing?: Pull over shirt    Upper body assist Assist Level: Supervision/Verbal cueing    Lower Body Dressing/Undressing Lower body dressing      What is the patient wearing?: Pants, Underwear/pull up     Lower body assist Assist for lower body dressing: Contact Guard/Touching assist     Toileting Toileting Toileting Activity did not occur (Clothing management and hygiene only): N/A (no void or bm)  Toileting assist Assist for toileting: Contact Guard/Touching assist     Transfers Chair/bed transfer  Transfers assist     Chair/bed transfer assist level: Contact Guard/Touching assist Chair/bed transfer assistive device: Programmer, multimedia   Ambulation assist      Assist level: Contact Guard/Touching assist Assistive device: Walker-rolling Max distance: 30 ft   Walk 10 feet activity   Assist     Assist level: Minimal Assistance - Patient > 75% Assistive device: Walker-rolling   Walk 50 feet activity   Assist Walk 50 feet with 2 turns activity did not occur: Safety/medical concerns (decreased activity tolerance)          Walk 150 feet activity   Assist Walk 150 feet activity did not occur: Safety/medical concerns         Walk 10 feet on uneven surface  activity   Assist     Assist level: Minimal Assistance - Patient > 75% Assistive device: Walker-rolling   Wheelchair     Assist Is the patient using a wheelchair?: Yes Type of Wheelchair: Manual    Wheelchair assist level: Minimal Assistance - Patient > 75% Max wheelchair distance: 55 ft    Wheelchair 50 feet with 2 turns activity    Assist        Assist Level: Minimal Assistance - Patient > 75%   Wheelchair 150 feet activity     Assist      Assist Level: Total Assistance - Patient < 25%   Blood pressure 130/80, pulse (!) 58, temperature 98.4 F (36.9 C), temperature source Oral, resp. rate 16, height 6' (1.829 m), weight 70.8 kg, SpO2 96 %.  Medical Problem List and Plan:   SAH/IPH due to hypertensive emergency- with impaired ADLs/mobility  -Continue CIR therapies including PT, OT, and SLP   2. Impaired mobility: continue Sequential compression devices, below knee Bilateral lower extremities due to brain bleed 3. Headaches: Continue Norco for HA as needed and tylenol prn-    -11/20 topamax stopped by PA dt lethargy  -depakote 250mg  bid with some benefit?    3. Mood/sleep: emotional support- and monitor if needs  neuropsychology.   -antipsychotics: none  - sleep chart  -continue scheduled trazodone at night   4. Neuropsych- the patient is not  capable of making decisions for himself.  5. Skin/Wounds- routine wound care. Mainly with lower sacral stage one, tiny 26mm stage 2 open area , cont foam dressing  6. Fluids/Electrolytes/Nutrition: eating next to nothing  -encourage PO  -cognitive component 7. HTN in setting of hypertensive emergency with orthostasis-   -entresto,  and aldactone       Vitals:   03/14/21 0509 03/14/21 0749  BP: 130/80   Pulse: (!) 58   Resp: 16   Temp: 98.4 F (36.9 C)    SpO2: 98% 96%    11/28 Control improved. still some lability   8. CHF with EF 40-45%- con't Entresto and aldactone .   Filed Weights   03/12/21 0509 03/13/21 0404 03/14/21 0500  Weight: 71.1 kg 71 kg 70.8 kg    one time dose of Lasix 10mg  11/26  -weight stable 11/28--observe 9. COPD- con't O2 and wean as tolerated-also continue his regimen of COPD meds- including Pulmicort, Albuterol prn, Brovana. And change as required.  10. Constipation-      -senna-s at HS scheduled 11. UTI- resolved- s/p IV rocephin 12. Smoker- tobacco- abuse- counseling and Nicoderm 21 mg daily 14. Hx of prostate CA s/p XRT seeding 16.  GERD- con't protonix and monitor/change as required 17. AAA/CAD/HLD- con't to monitor 18. Knee pain. Considered TKA at one point -continue diclofenac gel- ?knee brace   LOS: 16 days A FACE TO FACE EVALUATION WAS PERFORMED  Meredith Staggers 03/14/2021, 1:11 PM

## 2021-03-14 NOTE — Progress Notes (Signed)
Physical Therapy Session Note  Patient Details  Name: [name] MRN: [id number] Date of Birth: 1945-07-27  Today's Date: 03/14/2021 PT Individual Time: 1002-1059 PT Individual Time Calculation (min): 57 min   Short Term Goals: Week 2:  PT Short Term Goal 1 (Week 2): Pt will ambulate 100' with CGA and LRAD. PT Short Term Goal 2 (Week 2): Pt willi mprove BERG score by MCID >8 points.  Skilled Therapeutic Interventions/Progress Updates:    Pt received in dayroom. Handoff from SLP. Pt reports knee pain is "not bad". PT provides rest breaks and mobility to manage pain. Pt performs kinetron for bilateral lower extremity strength and endurance training. Pt completes at 20 cm/sec for 8 minutes with no rest breaks.   Pt performs sit to stand with setup assistance and use of RW. Pt then ambulates x175' with RW and close supervision, with cues to increase upright gaze to improve posture and balance, and increasing stride length to decrease risk for falls.  Pt performs BERG balance test with score of 33/56, significantly improving previous score of 15/56.  Pt ambulates additional 175' with challenge of ambulating "as quickly as possible." Pt completes with occasional cues to increase stride length and upright posture to decrease risk for falls.  Pt left supine in bed with alarm intact with all needs within reach.  Therapy Documentation Precautions:  Precautions Precautions: Fall Precaution Comments: Labile BP, monitor with mobility Restrictions Weight Bearing Restrictions: No    Therapy/Group: Individual Therapy  Breck Coons, PT, DPT 03/14/2021, 10:20 AM

## 2021-03-14 NOTE — Progress Notes (Signed)
Occupational Therapy Session Note  Patient Details  Name: [name] MRN: [id number] Date of Birth: 01-13-1946  Today's Date: 03/14/2021 OT Individual Time: 3014-9969 OT Individual Time Calculation (min): 73 min    Short Term Goals: Week 2:  OT Short Term Goal 1 (Week 2): Patient will initiate bed mobility with min cues OT Short Term Goal 2 (Week 2): Patient will maintain standing balance with CGA within BADL task OT Short Term Goal 3 (Week 2): Patient will complete LB dressing with CGA  Skilled Therapeutic Interventions/Progress Updates:    Pt greeted semi-reclined in bed asleep, easy to wake and agreeable to OT treatment session. Pt wanted to shower. Pt completed bed mobility with supervision, then sit<>stand with min cues for hand placement on RW, but supervision. Functional ambulation to toilet with supervision. Pt voided bladder and doffed clothing seated on toilet. Pt needed close supervision to step over ledge into shower. Bathing completed with supervision sit<>stand from shower seat. Dressing sit<>stand with overall supervision and min cues for hand placement again on RW. Standing balance/endurance with standing grooming tasks at the sink. Pt completed shaving and toothbrushing tasks with 1 posterior lobe requiring CGA to correct. Functional ambulation in hallway with RW and cues for step length, gait speed, and proximity to RW. Pt returned to room and left semi-reclined in bed with bed alarm on, call bell in reach, and needs met.   Therapy Documentation Precautions:  Precautions Precautions: Fall Precaution Comments: Labile BP, monitor with mobility Restrictions Weight Bearing Restrictions: No Pain:   Denies pain  Therapy/Group: Individual Therapy  Valma Cava 03/14/2021, 2:19 PM

## 2021-03-14 NOTE — Progress Notes (Signed)
Speech Language Pathology Daily Session Note  Patient Details  Name: [name] MRN: [id number] Date of Birth: 1945-09-08  Today's Date: 03/14/2021 SLP Individual Time: 0915-1000 SLP Individual Time Calculation (min): 45 min  Short Term Goals: Week 2: SLP Short Term Goal 1 (Week 2): Patient will demonstrate complex problem solving for functional tasks with Min verbal cues. SLP Short Term Goal 2 (Week 2): Patient will recall new, daily information with supervision level verbal cues. SLP Short Term Goal 3 (Week 2): Patient will self-monitor and correct errors during functional tasks with supervision verbal cues. SLP Short Term Goal 4 (Week 2): Patient will demonstrate sustained attention to functional tasks for 45 minutes with supervision verbal cues for redirection.  Skilled Therapeutic Interventions: Skilled treatment session focused on cognitive goals. Upon arrival, patient was awake in bed. SLP donned TED hoes with total A due to time constraints. Patient donned shoes and ambulated to the sink to perform basic self-care tasks independently. SLP facilitated session by providing Min A verbal cues for functional problem solving during a complex medication management task of organizing a BID pill box as patient reports he often gets "am" and "pm" confused. Patient also reports that he writes down all of the medication at home for his wife along with her daily vitals. Patient donned a blood pressure cuff onto the SLP's left arm independently and was able to locate all pertinent vitals from the dynamap and write them down legibly and appropriately. Patient handed off to PT. Continue with current plan of care.      Pain No/Denies Pain   Therapy/Group: Individual Therapy  Carlinda Ohlson 03/14/2021, 11:43 AM

## 2021-03-14 NOTE — Progress Notes (Signed)
Patient ID: [name], male   DOB: [birth year], 75 y.o.   MRN: [id number]  SW spoke with pt stepson Shawn [phone]) to discuss d/c DME recs. SW will order RW and 3in1 BSC.   *SW ordered 3in1 BSC and RW with [employer] via parachute.   Loralee Pacas, MSW, [place] Office: [phone] Cell: [phone] Fax: [phone]

## 2021-03-15 DIAGNOSIS — E440 Moderate protein-calorie malnutrition: Secondary | ICD-10-CM | POA: Diagnosis not present

## 2021-03-15 DIAGNOSIS — K5901 Slow transit constipation: Secondary | ICD-10-CM | POA: Diagnosis not present

## 2021-03-15 DIAGNOSIS — I619 Nontraumatic intracerebral hemorrhage, unspecified: Secondary | ICD-10-CM | POA: Diagnosis not present

## 2021-03-15 DIAGNOSIS — I10 Essential (primary) hypertension: Secondary | ICD-10-CM | POA: Diagnosis not present

## 2021-03-15 DIAGNOSIS — I5042 Chronic combined systolic (congestive) and diastolic (congestive) heart failure: Secondary | ICD-10-CM | POA: Diagnosis not present

## 2021-03-15 MED ORDER — BISACODYL 10 MG RE SUPP: 10.0000 mg | Freq: Every day | RECTAL | Status: DC | PRN

## 2021-03-15 NOTE — Patient Care Conference (Signed)
Inpatient RehabilitationTeam Conference and Plan of Care Update Date: 03/15/2021   Time: 10:49 AM    Patient Name: [name]      Medical Record Number: [id number]  Date of Birth: February 22, 1946 Sex: Male         Room/Bed: 5C15C/5C15C-01 Payor Info: Payor: [employer] / Plan: [employer] PPO / Product Type: *No Product type* /    Admit Date/Time:  02/26/2021  4:12 PM  Primary Diagnosis:  Intraparenchymal hemorrhage of brain [facility])  Hospital Problems: Principal Problem:   Intraparenchymal hemorrhage of brain ([place]) Active Problems:   Pressure injury of skin   Orthostatic hypotension   Chronic combined systolic and diastolic congestive heart failure [facility])   Vascular headache    Expected Discharge Date: Expected Discharge Date: 03/16/21  Team Members Present: Physician leading conference: Dr. Alger Simons Social Worker Present: Loralee Pacas, [place] Nurse Present: Dorthula Nettles, RN PT Present: Tereasa Coop, PT OT Present: Cherylynn Ridges, OT SLP Present: Weston Anna, SLP PPS Coordinator present : Gunnar Fusi, SLP     Current Status/Progress Goal Weekly Team Focus  Bowel/Bladder   incontinent episodes, bowel program  regain continence  toilet q 2 hr and prn, offer urinal   Swallow/Nutrition/ Hydration             ADL's   Supervision, improved attention and initiation  supervision overall  dc planning, pt/family education, balance   Mobility   supervision all mobility, ambulating >300' with RW  Supervision  DC prep   Communication             Safety/Cognition/ Behavioral Observations  Min A  Min A  Education complete, will d/c home tomorrow with family   Pain   c/o of headache, Tylenol scheduled  < 3  assess pain q 4 hr and prn   Skin   no breakdown  remain free of breakdown  assess skin q shift and prn     Discharge Planning:  Pt to d/c to home with his wife. Wife reports she is working on addtl support from her sons. 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 Pt step-son Raquel Sarna has agreed to be at the home 3 nights per week to assist with pt care needs. Working on other days with siblings. Pt is [facility] with [facility] for HHPT/OT/SLP/aide.   Team Discussion: Knees are feeling better. Unable to have knee replacement surgery due to medical issues. Adjusting medications. Has incontinent episodes. Has Tylenol for reported headaches. Stage 2 to coccyx with appropriate treatment provided. Medically ready for discharge.  Patient on target to meet rehab goals: yes, at goal level and ready for discharge.  *See Care Plan and progress notes for long and short-term goals.   Revisions to Treatment Plan:  Adjusting medications and finalizing discharge plans.  Teaching Needs: Family education with step-son completed.  Current Barriers to Discharge: Incontinence and Wound care  Possible Resolutions to Barriers: Family education Time toileting schedule HH for HHPT/OT/SLP set up     Medical Summary Current Status: headaches seem better. pt dealing with right knee, voltaren gel helpful. more alert in general. bp's controlled  Barriers to Discharge: Medical stability   Possible Resolutions to Barriers/Weekly Focus: finalizing medical plan for discharge.   Continued Need for Acute Rehabilitation Level of Care: The patient requires daily medical management by a physician with specialized training in physical medicine and rehabilitation  for the following reasons: Direction of a multidisciplinary physical rehabilitation program to maximize functional independence : Yes Medical management of patient stability for increased activity during participation in an intensive rehabilitation regime.: Yes Analysis of laboratory values and/or radiology reports with any subsequent need for medication adjustment and/or medical intervention. : Yes   I attest  that I was present, lead the team conference, and concur with the assessment and plan of the team.   Cristi Loron 03/15/2021, 2:42 PM

## 2021-03-15 NOTE — Progress Notes (Signed)
Patient ID: [name], male   DOB: 12-Nov-1945, 75 y.o.   MRN: [id number]  SW received updates from [facility] that pt DME was held for copay and message left for his wife. SW will discuss with pt wife.   *SW met with pt wife to ensure DME payment was made.   Loralee Pacas, MSW, [place] Office: [phone] Cell: [phone] Fax: [phone]

## 2021-03-15 NOTE — Progress Notes (Signed)
Inpatient Rehabilitation Discharge Medication Review by a Pharmacist  A complete drug regimen review was completed for this patient to identify any potential clinically significant medication issues.  High Risk Drug Classes Is patient taking? Indication by Medication  Antipsychotic No   Anticoagulant No   Antibiotic No   Opioid No   Antiplatelet No   Hypoglycemics/insulin No   Vasoactive Medication Yes Coreg, Entresto, Spironolactone- HFrEF (30-35%)/HTN  Chemotherapy No   Other Yes Valproic acid- Headache prophylaxis      Type of Medication Issue Identified Description of Issue Recommendation(s)  Drug Interaction(s) (clinically significant)     Duplicate Therapy     Allergy     No Medication Administration End Date     Incorrect Dose     Additional Drug Therapy Needed  Lipitor Home medication that was not continued upon admission to CIR. Discharge summary dated 02/26/2021 from neurosurgery (Dr. Christella Noa) shows Lipitor as continued. Recommend restarting medication   Significant med changes from prior encounter (inform family/care partners about these prior to discharge).    Other  PTA meds: Lipitor, omeprazole, Symbicort, singulair, albuterol MDI  Medications used per interchange policy upon admission: protonix, Brovana, Albuterol nebs, Pulmicort Continue PTA meds upon discharge if clinically necessary and those that were interchanged to formulary items (omeprazole, Symbicort, albuterol MDI)  Discontinue meds that were interchanged upon discharge when re-ordering home meds that they replaced (protonix, Brovana, Albuterol nebs, Pulmicort)    Clinically significant medication issues were identified that warrant physician communication and completion of prescribed/recommended actions by midnight of the next day:  Yes  Contacted Pam Love, PA via secure chat to alert her to the note from Dr. Christella Noa dated 02/26/2021 recommending Lipitor continuation.    Time spent performing  this drug regimen review (minutes):  30   Jaleya Pebley BS, PharmD, BCPS Clinical Pharmacist  03/15/2021 9:34 AM

## 2021-03-15 NOTE — Progress Notes (Signed)
Pharmacy note reviewed--medications that were not Rx on acute were not resumed unless needed. Pharmacy does not stock omeprazole or Symbicort therefore formulary medications continued. Aware of home meds and will resume as able at discharge.

## 2021-03-15 NOTE — Progress Notes (Signed)
PROGRESS NOTE   Subjective/Complaints: Pt alert. Doing fairly well. Up with therapy when I caught him  ROS: Patient denies fever, rash, sore throat, blurred vision, nausea, vomiting, diarrhea, cough, shortness of breath or chest pain,   or mood change.   Objective:   No results found. Recent Labs    03/14/21 0711  WBC 7.6  HGB 15.3  HCT 45.4  PLT 204     Recent Labs    03/14/21 0711  NA 138  K 3.8  CL 104  CO2 26  GLUCOSE 109*  BUN 17  CREATININE 0.97  CALCIUM 9.0      Intake/Output Summary (Last 24 hours) at 03/15/2021 1144 Last data filed at 03/15/2021 2956 Gross per 24 hour  Intake 840 ml  Output --  Net 840 ml     Pressure Injury 02/26/21 Coccyx Mid;Posterior Stage 2 -  Partial thickness loss of dermis presenting as a shallow open injury with a red, pink wound bed without slough. (Active)  02/26/21 1702  Location: Coccyx  Location Orientation: Mid;Posterior  Staging: Stage 2 -  Partial thickness loss of dermis presenting as a shallow open injury with a red, pink wound bed without slough.  Wound Description (Comments):   Present on Admission: Yes    Physical Exam: Vital Signs Blood pressure (!) 148/95, pulse (!) 49, temperature 97.6 F (36.4 C), resp. rate 16, height 6' (1.829 m), weight 70.8 kg, SpO2 98 %. Constitutional: No distress . Vital signs reviewed. HEENT: NCAT, EOMI, oral membranes moist Neck: supple Cardiovascular: RRR without murmur. No JVD    Respiratory/Chest: CTA Bilaterally without wheezes or rales. Normal effort    GI/Abdomen: BS +, non-tender, non-distended Ext: no clubbing, cyanosis, or edema Psych: flat but cooperative Skin: intact Neurologic: fair insight, normal language. Follows commands. Cranial nerves II through XII intact, motor strength is 5/5 in bilateral deltoid, bicep, tricep, grip, hip flexor, knee extensors, ankle dorsiflexor and plantar flexor Sensory exam  normal for light touch and pain in all 4 limbs. No limb ataxia or cerebellar signs. No abnormal tone appreciated.   Musculoskeletal: mild knee pain with AROM R>L--minimal effusion   Assessment/Plan: 1. Functional deficits which require 3+ hours per day of interdisciplinary therapy in a comprehensive inpatient rehab setting. Physiatrist is providing close team supervision and 24 hour management of active medical problems listed below. Physiatrist and rehab team continue to assess barriers to discharge/monitor patient progress toward functional and medical goals  Care Tool:  Bathing    Body parts bathed by patient: Right arm, Left arm, Chest, Abdomen, Front perineal area, Right upper leg, Left upper leg, Right lower leg, Left lower leg, Face, Buttocks   Body parts bathed by helper: Buttocks     Bathing assist Assist Level: Supervision/Verbal cueing     Upper Body Dressing/Undressing Upper body dressing   What is the patient wearing?: Pull over shirt    Upper body assist Assist Level: Set up assist    Lower Body Dressing/Undressing Lower body dressing      What is the patient wearing?: Pants, Underwear/pull up     Lower body assist Assist for lower body dressing: Supervision/Verbal cueing  Toileting Toileting Toileting Activity did not occur Landscape architect and hygiene only): N/A (no void or bm)  Toileting assist Assist for toileting: Supervision/Verbal cueing     Transfers Chair/bed transfer  Transfers assist     Chair/bed transfer assist level: Supervision/Verbal cueing Chair/bed transfer assistive device: Museum/gallery exhibitions officer assist      Assist level: Contact Guard/Touching assist Assistive device: Walker-rolling Max distance: 30 ft   Walk 10 feet activity   Assist     Assist level: Minimal Assistance - Patient > 75% Assistive device: Walker-rolling   Walk 50 feet activity   Assist Walk 50 feet with 2 turns  activity did not occur: Safety/medical concerns (decreased activity tolerance)         Walk 150 feet activity   Assist Walk 150 feet activity did not occur: Safety/medical concerns         Walk 10 feet on uneven surface  activity   Assist     Assist level: Minimal Assistance - Patient > 75% Assistive device: Chemical engineer     Assist Is the patient using a wheelchair?: Yes Type of Wheelchair: Manual    Wheelchair assist level: Minimal Assistance - Patient > 75% Max wheelchair distance: 55 ft    Wheelchair 50 feet with 2 turns activity    Assist        Assist Level: Minimal Assistance - Patient > 75%   Wheelchair 150 feet activity     Assist      Assist Level: Total Assistance - Patient < 25%   Blood pressure (!) 148/95, pulse (!) 49, temperature 97.6 F (36.4 C), resp. rate 16, height 6' (1.829 m), weight 70.8 kg, SpO2 98 %.  Medical Problem List and Plan:   SAH/IPH due to hypertensive emergency- with impaired ADLs/mobility -Continue CIR therapies including PT, OT, and SLP. Interdisciplinary team conference today to discuss goals, barriers to discharge, and dc planning.     2. Impaired mobility: continue Sequential compression devices, below knee Bilateral lower extremities due to brain bleed 3. Headaches: Continue Norco for HA as needed and tylenol prn-    -11/20 topamax stopped by PA dt lethargy  -depakote 250mg  bid with some benefit    3. Mood/sleep: emotional support- and monitor if needs  neuropsychology.   -antipsychotics: none  - sleep chart  -continue scheduled trazodone at night   4. Neuropsych- the patient is not  capable of making decisions for himself.  5. Skin/Wounds- routine wound care. Mainly with lower sacral stage one, tiny 20mm stage 2 open area , cont foam dressing  6. Fluids/Electrolytes/Nutrition: eating next to nothing  -encourage PO  -cognitive component 7. HTN in setting of hypertensive emergency  with orthostasis-   -entresto,  and aldactone       Vitals:   03/14/21 2114 03/15/21 0436  BP:  (!) 148/95  Pulse:  (!) 49  Resp:  16  Temp:  97.6 F (36.4 C)  SpO2: 93% 98%    11/29 Control improved. still some lability but less   8. CHF with EF 40-45%- con't Entresto and aldactone .   Filed Weights   03/12/21 0509 03/13/21 0404 03/14/21 0500  Weight: 71.1 kg 71 kg 70.8 kg    one time dose of Lasix 10mg  11/26  -weight stable 11/28--observe 9. COPD- con't O2 and wean as tolerated-also continue his regimen of COPD meds- including Pulmicort, Albuterol prn, Brovana. And change as required.  10. Constipation-      -  senna-s at HS scheduled 11. UTI- resolved- s/p IV rocephin 12. Smoker- tobacco- abuse- counseling and Nicoderm 21 mg daily 14. Hx of prostate CA s/p XRT seeding 16. GERD- con't protonix and monitor/change as required 17. AAA/CAD/HLD- con't to monitor 18. Knee pain. Considered TKA at one point -continue diclofenac gel- ?knee brace   LOS: 17 days A FACE TO [place] 03/15/2021, 11:44 AM

## 2021-03-15 NOTE — Progress Notes (Signed)
Occupational Therapy Session Note  Patient Details  Name: [name] MRN: [id number] Date of Birth: 04/03/1946  Today's Date: 03/15/2021 OT Individual Time: 1430-1509 OT Individual Time Calculation (min): 39 min    Short Term Goals: Week 1:  OT Short Term Goal 1 (Week 1): Patient will complete toilet transfer with Min guard. OT Short Term Goal 1 - Progress (Week 1): Met OT Short Term Goal 2 (Week 1): Patient will complete shower transfer with Min guard. OT Short Term Goal 2 - Progress (Week 1): Met OT Short Term Goal 3 (Week 1): Patient will don LB clothing with Min gaurd. OT Short Term Goal 3 - Progress (Week 1): Met Week 2:  OT Short Term Goal 1 (Week 2): Patient will initiate bed mobility with min cues OT Short Term Goal 2 (Week 2): Patient will maintain standing balance with CGA within BADL task OT Short Term Goal 3 (Week 2): Patient will complete LB dressing with CGA   Skilled Therapeutic Interventions/Progress Updates:    Pt greeted at time of session semireclined in bed resting, agreeable to OT session. No pain reported throughout session. Supine > sit Mod I and discussion with pt regarding DC home, family support, etc. Pt feeling ready to go home and eager to go home. Donned shoes sitting EOB with Set up. Stand pivot bed <> wheelchair Supervision and RW. Transported to 4th floor gym and focused on standing balance and LUE GMC/FMC with 2 rounds of standing BITS activities, first round A-Z with 96% accuracy and second round in 3 color sequence with 97% accuracy. Pt having questions throughout session regarding DC tomorrow, DC time, and questions answered. Back up in room, stand pivot to bed same manner, alarm on call bell in reach all needs met.   Therapy Documentation Precautions:  Precautions Precautions: Fall Precaution Comments: Labile BP, monitor with mobility Restrictions Weight Bearing Restrictions: No    Therapy/Group: Individual Therapy  Viona Gilmore 03/15/2021, 7:27 AM

## 2021-03-15 NOTE — Progress Notes (Signed)
Speech Language Pathology Discharge Summary  Patient Details  Name: [name] MRN: [id number] Date of Birth: [birth year]  Today's Date: 03/15/2021 SLP Individual Time: 0915-0955 SLP Individual Time Calculation (min): 40 min   Skilled Therapeutic Interventions:  Skilled treatment session focused on cognitive goals. SLP facilitated session by re-administering the Cognistat. Patient scored WFL on all subtests with the exception of mild impairments in short-term recall which is an improvement since initial evaluation. SLP also facilitated session by providing education regarding memory compensatory strategies and how to live a healthy brain lifestyle at discharge. Patient verbalized understanding of all information and handouts were given to reinforce information. Patient transferred back to bed at end of session per his request. Patient left upright in bed with alarm on and all needs within reach.   Patient has met 4 of 4 long term goals.  Patient to discharge at [facility] level.   Reasons goals not met: N/A   Clinical Impression/Discharge Summary: Patient has made functional gains and has met 4 of 4 STGs this reporting period. Patient's overall cognitive functioning can fluctuate throughout the day. However, patient requires overall Min A multimodal cues to complete functional and familiar tasks safely in regards to selective attention, functional problem solving, recall with use of external aids and emergent awareness. Patient and family education is complete and patient will discharge home with 24 hour supervision from family. Patient would benefit from f/u SLP services to maximize his cognitive functioning and overall functional independence in order to reduce caregiver burden.   Care Partner:  Caregiver Able to Provide Assistance: Yes  Type of Caregiver Assistance: Physical;Cognitive  Recommendation:  24 hour supervision/assistance;Home Health SLP  Rationale for SLP Follow Up: Reduce  caregiver burden;Maximize cognitive function and independence   Equipment: N/A   Reasons for discharge: Discharged from hospital;Treatment goals met   Patient/Family Agrees with Progress Made and Goals Achieved: Yes    [place], [place] 03/15/2021, 6:35 AM

## 2021-03-15 NOTE — Plan of Care (Signed)
  Problem: RH Balance Goal: LTG Patient will maintain dynamic standing with ADLs (OT) Description: LTG:  Patient will maintain dynamic standing balance with assist during activities of daily living (OT)  Outcome: Completed/Met   Problem: Sit to Stand Goal: LTG:  Patient will perform sit to stand in prep for activites of daily living with assistance level (OT) Description: LTG:  Patient will perform sit to stand in prep for activites of daily living with assistance level (OT) Outcome: Completed/Met   Problem: RH Grooming Goal: LTG Patient will perform grooming w/assist,cues/equip (OT) Description: LTG: Patient will perform grooming with assist, with/without cues using equipment (OT) Outcome: Completed/Met   Problem: RH Bathing Goal: LTG Patient will bathe all body parts with assist levels (OT) Description: LTG: Patient will bathe all body parts with assist levels (OT) Outcome: Completed/Met   Problem: RH Dressing Goal: LTG Patient will perform upper body dressing (OT) Description: LTG Patient will perform upper body dressing with assist, with/without cues (OT). Outcome: Completed/Met Goal: LTG Patient will perform lower body dressing w/assist (OT) Description: LTG: Patient will perform lower body dressing with assist, with/without cues in positioning using equipment (OT) Outcome: Completed/Met   Problem: RH Toileting Goal: LTG Patient will perform toileting task (3/3 steps) with assistance level (OT) Description: LTG: Patient will perform toileting task (3/3 steps) with assistance level (OT)  Outcome: Completed/Met   Problem: RH Toilet Transfers Goal: LTG Patient will perform toilet transfers w/assist (OT) Description: LTG: Patient will perform toilet transfers with assist, with/without cues using equipment (OT) Outcome: Completed/Met   Problem: RH Tub/Shower Transfers Goal: LTG Patient will perform tub/shower transfers w/assist (OT) Description: LTG: Patient will perform  tub/shower transfers with assist, with/without cues using equipment (OT) Outcome: Completed/Met   Problem: RH Attention Goal: LTG Patient will demonstrate this level of attention during functional activites (OT) Description: LTG:  Patient will demonstrate this level of attention during functional activites  (OT) Outcome: Completed/Met   Problem: RH Awareness Goal: LTG: Patient will demonstrate awareness during functional activites type of (OT) Description: LTG: Patient will demonstrate awareness during functional activites type of (OT) Outcome: Completed/Met

## 2021-03-15 NOTE — Progress Notes (Signed)
Occupational Therapy Discharge Summary  Patient Details  Name: [name] MRN: [id number] Date of Birth: 04/23/1945  Today's Date: 03/15/2021 OT Individual Time: 1102-1200 OT Individual Time Calculation (min): 58 min   Pt greeted semi-reclined in bed and agreeable to OT treatment session. Pt completed functional ambulation, transfers (toilet and shower) and all BADL tasks at supervision level. Supervision for standing grooming tasks with overall increase endurance and functional use of L UE. See functional navigator below for further details regarding BADL performance. Worked on cognitive retraining and bimanual coordination with pipe tree puzzle. Pt able to complete with only min cues to find correct sized pipes. Pt returned to room and left seated in wc with alarm belt on awaiting lunch. Call bell in reach and needs met.    Patient has met 11 of 11 long term goals due to improved activity tolerance, improved balance, postural control, ability to compensate for deficits, functional use of  LEFT upper and LEFT lower extremity, improved attention, improved awareness, and improved coordination.  Patient to discharge at overall Supervision level.  Patient's care partner is independent to provide the necessary physical and cognitive assistance at discharge for higher level iADL tasks.    Reasons goals not met: n/a  Recommendation:  Patient will benefit from ongoing skilled OT services in home health setting to continue to advance functional skills in the area of BADL and Reduce care partner burden.  Equipment: RW, 3-in-1 BSC  Reasons for discharge: treatment goals met and discharge from hospital  Patient/family agrees with progress made and goals achieved: Yes  OT Discharge Precautions/Restrictions  Precautions Precautions: Fall Restrictions Weight Bearing Restrictions: No Pain Pain Assessment Pain Scale: 0-10 Pain Score: 0-No pain ADL ADL Eating: Set up Grooming: Setup Where  Assessed-Grooming: Chair Upper Body Bathing: Supervision/safety Where Assessed-Upper Body Bathing: Edge of bed Lower Body Bathing: Supervision/safety Where Assessed-Lower Body Bathing: Edge of bed Upper Body Dressing: Supervision/safety Where Assessed-Upper Body Dressing: Edge of bed Lower Body Dressing: Supervision/safety Where Assessed-Lower Body Dressing: Edge of bed Toileting: Supervision/safety Toilet Transfer: Distant supervision Toilet Transfer Method: Not assessed Tub/Shower Transfer: Close supervison ADL Comments: Limited 2/2 hypertension Vision Eye Alignment: Within Functional Limits Perception  Perception: Within Functional Limits Praxis Praxis: Intact Cognition Overall Cognitive Status: Impaired/Different from baseline Arousal/Alertness: Awake/alert Orientation Level: Oriented X4 Year: 2022 Month: November Day of Week: Correct Attention: Selective Sustained Attention: Appears intact Selective Attention: Impaired Selective Attention Impairment: Verbal basic;Functional basic Memory: Impaired Memory Impairment: Decreased short term memory;Decreased recall of new information;Retrieval deficit Decreased Short Term Memory: Verbal basic;Functional basic Immediate Memory Recall: Sock;Blue;Bed Memory Recall Sock: Without Cue Memory Recall Blue: Without Cue Memory Recall Bed: Without Cue Awareness: Impaired Awareness Impairment: Emergent impairment Problem Solving: Impaired Problem Solving Impairment: Functional complex Safety/Judgment: Appears intact Sensation Sensation Light Touch: Appears Intact Hot/Cold: Appears Intact Coordination Gross Motor Movements are Fluid and Coordinated: No Fine Motor Movements are Fluid and Coordinated: No Coordination and Movement Description: Bimanual coordination greatly improved Motor  Motor Motor - Discharge Observations: L hemiplegia greatly improved from eval Mobility  Bed Mobility Supine to Sit: Supervision/Verbal  cueing Sitting - Scoot to Edge of Bed: Supervision/Verbal cueing Sit to Supine: Supervision/Verbal cueing Transfers Sit to Stand: Supervision/Verbal cueing Stand to Sit: Supervision/Verbal cueing  Trunk/Postural Assessment     Balance Static Sitting Balance Static Sitting - Level of Assistance: 7: Independent Dynamic Sitting Balance Dynamic Sitting - Level of Assistance: 6: Modified independent (Device/Increase time) Static Standing Balance Static Standing - Level of Assistance: 5: Stand  by assistance Dynamic Standing Balance Dynamic Standing - Level of Assistance: 5: Stand by assistance Extremity/Trunk Assessment RUE Assessment RUE Assessment: Within Functional Limits LUE Assessment LUE Assessment: Exceptions to [facility] General Strength Comments: Functional strength WFL, overall 4/5, still some generalized weakness and mild coordination deficits Brunstrum levels for arm and hand: Arm;Hand Brunstrum level for arm: Stage V Relative Independence from Synergy Brunstrum level for hand: Stage VI Isolated joint movements   Daneen Schick Sherod Cisse 03/15/2021, 11:46 AM

## 2021-03-15 NOTE — Progress Notes (Signed)
Physical Therapy Discharge Summary  Patient Details  Name: [name] MRN: [id number] Date of Birth: [birth year]  Today's Date: 03/15/2021 PT Individual Time: 0805-0859 PT Individual Time Calculation (min): 54 min    Patient has met 14 of 14 long term goals due to improved activity tolerance, improved balance, improved postural control, increased strength, improved attention, and improved awareness.  Patient to discharge at an ambulatory level Supervision.   Patient's care partner is independent to provide the necessary physical and cognitive assistance at discharge.  Reasons goals not met: NA  Recommendation:  Patient will benefit from ongoing skilled PT services in home health setting to continue to advance safe functional mobility, address ongoing impairments in strenth, balance, ambulation, and minimize fall risk.  Equipment: RW  Reasons for discharge: treatment goals met and discharge from hospital  Patient/family agrees with progress made and goals achieved: Yes  Skilled Therapeutic Interventions:  Pt received seated in [facility] and agrees to therapy. Reports pain in knees much improved from previous sessions. Number not provided. PT provides rest breaks and mobility to manage pain. WC transport to gym for time management. Pt performs car transfer and ramp navigation x2 with verbal cues for sequencing, upright gaze to improve posture and balance. Following pt performs 6 Minute Walk Test with RW and score of 357', ambulating with verbal cues to increase stride length to decrease risk for falls. Score significantly improved from 21' with minA on 11/13. Pt performs 5x Sit to Stand with score of 28 seconds, improved from 1:10 on 11/13. Finally, pt performs TUG with score of 29.5.   Pt completes 12 steps with bilateral hand rails and cues for sequencing. WC transport back to room. Left seated with alarm intact and all needs within reach.   PT  Discharge Precautions/Restrictions Precautions Precautions: Fall Restrictions Weight Bearing Restrictions: No Pain Interference Pain Interference Pain Effect on Sleep: 3. Frequently Pain Interference with Therapy Activities: 1. Rarely or not at all Pain Interference with Day-to-Day Activities: 1. Rarely or not at all Vision/Perception  Vision - Assessment Eye Alignment: Within Functional Limits Perception Perception: Within Functional Limits Praxis Praxis: Intact  Cognition Overall Cognitive Status: Impaired/Different from baseline Arousal/Alertness: Awake/alert Orientation Level: Oriented X4 Year: 2022 Month: November Day of Week: Correct Attention: Selective Sustained Attention: Appears intact Selective Attention: Impaired Selective Attention Impairment: Verbal basic;Functional basic Memory: Impaired Memory Impairment: Decreased short term memory;Decreased recall of new information;Retrieval deficit Decreased Short Term Memory: Verbal basic;Functional basic Immediate Memory Recall: Sock;Blue;Bed Memory Recall Sock: Without Cue Memory Recall Blue: Without Cue Memory Recall Bed: Without Cue Awareness: Impaired Awareness Impairment: Emergent impairment Problem Solving: Impaired Problem Solving Impairment: Functional complex Safety/Judgment: Appears intact Sensation Sensation Light Touch: Appears Intact Hot/Cold: Appears Intact Coordination Gross Motor Movements are Fluid and Coordinated: No Fine Motor Movements are Fluid and Coordinated: No Coordination and Movement Description: Bimanual coordination greatly improved Motor  Motor Motor - Discharge Observations: L hemiplegia greatly improved from eval  Mobility Bed Mobility Supine to Sit: Supervision/Verbal cueing Sitting - Scoot to Edge of Bed: Supervision/Verbal cueing Sit to Supine: Supervision/Verbal cueing Transfers Transfers: Sit to Stand;Stand to Sit;Stand Pivot Transfers Sit to Stand:  Supervision/Verbal cueing Stand to Sit: Supervision/Verbal cueing Stand Pivot Transfers: Supervision/Verbal cueing Locomotion  Gait Ambulation: Yes Gait Assistance: Supervision/Verbal cueing Gait Distance (Feet): 357 Feet Assistive device: Rolling walker Gait Assistance Details: Verbal cues for technique;Verbal cues for safe use of DME/AE;Verbal cues for gait pattern Gait Gait: Yes Gait Pattern: Impaired Gait Pattern: Decreased stride length;Narrow base  of support Gait velocity: decreased Stairs / Additional Locomotion Stairs: Yes Stairs Assistance: Supervision/Verbal cueing Stair Management Technique: Two rails Number of Stairs: 12 Height of Stairs: 6 Ramp: Supervision/Verbal cueing Curb: Supervision/Verbal cueing Wheelchair Mobility Wheelchair Mobility: No  Trunk/Postural Assessment  Cervical Assessment Cervical Assessment: Within Functional Limits Thoracic Assessment Thoracic Assessment:  (rounded shoulders) Lumbar Assessment Lumbar Assessment:  (posterior pelvic tilt) Postural Control Postural Control: Deficits on evaluation Righting Reactions: delayed but improved from eval Protective Responses: delayed but improved from eval  Balance Static Sitting Balance Static Sitting - Level of Assistance: 7: Independent Dynamic Sitting Balance Dynamic Sitting - Level of Assistance: 6: Modified independent (Device/Increase time) Static Standing Balance Static Standing - Level of Assistance: 5: Stand by assistance Dynamic Standing Balance Dynamic Standing - Level of Assistance: 5: Stand by assistance Extremity Assessment  RUE Assessment RUE Assessment: Within Functional Limits LUE Assessment LUE Assessment: Exceptions to [facility] General Strength Comments: Functional strength WFL, overall 4/5, still some generalized weakness and mild coordination deficits Brunstrum levels for arm and hand: Arm;Hand Brunstrum level for arm: Stage V Relative Independence from Synergy Brunstrum  level for hand: Stage VI Isolated joint movements RLE Assessment RLE Assessment: Within Functional Limits Active Range of Motion (AROM) Comments: WFL for functional mobility, tight heel cords and hamstrings General Strength Comments: Grossly 4+/5 throughout LLE Assessment LLE Assessment: Exceptions to [facility] Active Range of Motion (AROM) Comments: WFL for functional mobility, tight heel cords and hamstrings General Strength Comments: Grossly 4/5    Breck Coons, PT, DPT 03/15/2021, 1:44 PM

## 2021-03-16 DIAGNOSIS — G441 Vascular headache, not elsewhere classified: Secondary | ICD-10-CM | POA: Diagnosis not present

## 2021-03-16 DIAGNOSIS — I5042 Chronic combined systolic (congestive) and diastolic (congestive) heart failure: Secondary | ICD-10-CM | POA: Diagnosis not present

## 2021-03-16 DIAGNOSIS — I619 Nontraumatic intracerebral hemorrhage, unspecified: Secondary | ICD-10-CM | POA: Diagnosis not present

## 2021-03-16 MED ORDER — HYDROCODONE-ACETAMINOPHEN 10-325 MG PO TABS: 1.0000 | ORAL_TABLET | ORAL | 0 refills | Status: DC | PRN

## 2021-03-16 MED ORDER — ACETAMINOPHEN 325 MG PO TABS: 325.0000 mg | ORAL_TABLET | ORAL | Status: DC | PRN

## 2021-03-16 MED ORDER — VALPROIC ACID 250 MG PO CAPS: 250.0000 mg | ORAL_CAPSULE | Freq: Two times a day (BID) | ORAL | 0 refills | Status: DC

## 2021-03-16 MED ORDER — CYCLOBENZAPRINE HCL 5 MG PO TABS: 5.0000 mg | ORAL_TABLET | Freq: Every day | ORAL | 0 refills | Status: DC

## 2021-03-16 MED ORDER — DICLOFENAC SODIUM 1 % EX GEL: 2.0000 g | Freq: Three times a day (TID) | CUTANEOUS | 0 refills | Status: DC

## 2021-03-16 MED ORDER — NICOTINE 21 MG/24HR TD PT24: 21.0000 mg | MEDICATED_PATCH | Freq: Every day | TRANSDERMAL | 0 refills | Status: DC

## 2021-03-16 MED ORDER — SENNOSIDES-DOCUSATE SODIUM 8.6-50 MG PO TABS: 2.0000 | ORAL_TABLET | Freq: Every day | ORAL | 0 refills | Status: DC

## 2021-03-16 NOTE — Progress Notes (Signed)
PROGRESS NOTE   Subjective/Complaints: Pt in bed. Anxious about going home without wife but happy to be leaving hospital  ROS: Patient denies fever, rash, sore throat, blurred vision, nausea, vomiting, diarrhea, cough, shortness of breath or chest pain, joint or back pain, headache, or mood change.   Objective:   No results found. Recent Labs    03/14/21 0711  WBC 7.6  HGB 15.3  HCT 45.4  PLT 204     Recent Labs    03/14/21 0711  NA 138  K 3.8  CL 104  CO2 26  GLUCOSE 109*  BUN 17  CREATININE 0.97  CALCIUM 9.0      Intake/Output Summary (Last 24 hours) at 03/16/2021 8270 Last data filed at 03/16/2021 0754 Gross per 24 hour  Intake 700 ml  Output --  Net 700 ml     Pressure Injury 02/26/21 Coccyx Mid;Posterior Stage 2 -  Partial thickness loss of dermis presenting as a shallow open injury with a red, pink wound bed without slough. (Active)  02/26/21 1702  Location: Coccyx  Location Orientation: Mid;Posterior  Staging: Stage 2 -  Partial thickness loss of dermis presenting as a shallow open injury with a red, pink wound bed without slough.  Wound Description (Comments):   Present on Admission: Yes    Physical Exam: Vital Signs Blood pressure (!) 123/55, pulse (!) 51, temperature 98.2 F (36.8 C), temperature source Oral, resp. rate 18, height 6' (1.829 m), weight 69.1 kg, SpO2 94 %. Constitutional: No distress . Vital signs reviewed. HEENT: NCAT, EOMI, oral membranes moist Neck: supple Cardiovascular: RRR without murmur. No JVD    Respiratory/Chest: CTA Bilaterally without wheezes or rales. Normal effort    GI/Abdomen: BS +, non-tender, non-distended Ext: no clubbing, cyanosis, or edema Psych: pleasant and cooperative  Skin: intact, old coccyx wound Neurologic: fair insight, normal language. Follows commands. Cranial nerves II through XII intact, motor strength is 5/5 in bilateral deltoid, bicep,  tricep, grip, hip flexor, knee extensors, ankle dorsiflexor and plantar flexor Sensory exam normal for light touch and pain in all 4 limbs. No limb ataxia or cerebellar signs. No abnormal tone appreciated.   Musculoskeletal: mild knee pain with AROM R>L--minimal effusion on exam currently   Assessment/Plan: 1. Functional deficits which require 3+ hours per day of interdisciplinary therapy in a comprehensive inpatient rehab setting. Physiatrist is providing close team supervision and 24 hour management of active medical problems listed below. Physiatrist and rehab team continue to assess barriers to discharge/monitor patient progress toward functional and medical goals  Care Tool:  Bathing    Body parts bathed by patient: Right arm, Left arm, Chest, Abdomen, Front perineal area, Right upper leg, Left upper leg, Right lower leg, Left lower leg, Face, Buttocks   Body parts bathed by helper: Buttocks     Bathing assist Assist Level: Supervision/Verbal cueing     Upper Body Dressing/Undressing Upper body dressing   What is the patient wearing?: Pull over shirt    Upper body assist Assist Level: Set up assist    Lower Body Dressing/Undressing Lower body dressing      What is the patient wearing?: Pants, Underwear/pull up  Lower body assist Assist for lower body dressing: Supervision/Verbal cueing     Toileting Toileting Toileting Activity did not occur (Clothing management and hygiene only): N/A (no void or bm)  Toileting assist Assist for toileting: Supervision/Verbal cueing     Transfers Chair/bed transfer  Transfers assist     Chair/bed transfer assist level: Supervision/Verbal cueing Chair/bed transfer assistive device: Programmer, multimedia   Ambulation assist      Assist level: Supervision/Verbal cueing Assistive device: Walker-rolling Max distance: 357'   Walk 10 feet activity   Assist     Assist level: Supervision/Verbal  cueing Assistive device: Walker-rolling   Walk 50 feet activity   Assist Walk 50 feet with 2 turns activity did not occur: Safety/medical concerns (decreased activity tolerance)  Assist level: Supervision/Verbal cueing Assistive device: Walker-rolling    Walk 150 feet activity   Assist Walk 150 feet activity did not occur: Safety/medical concerns  Assist level: Supervision/Verbal cueing Assistive device: Walker-rolling    Walk 10 feet on uneven surface  activity   Assist     Assist level: Supervision/Verbal cueing Assistive device: Walker-rolling   Wheelchair     Assist Is the patient using a wheelchair?: No Type of Wheelchair: Manual    Wheelchair assist level: Minimal Assistance - Patient > 75% Max wheelchair distance: 55 ft    Wheelchair 50 feet with 2 turns activity    Assist        Assist Level: Minimal Assistance - Patient > 75%   Wheelchair 150 feet activity     Assist      Assist Level: Total Assistance - Patient < 25%   Blood pressure (!) 123/55, pulse (!) 51, temperature 98.2 F (36.8 C), temperature source Oral, resp. rate 18, height 6' (1.829 m), weight 69.1 kg, SpO2 94 %.  Medical Problem List and Plan:   SAH/IPH due to hypertensive emergency- with impaired ADLs/mobility -dc home today -f/u with [facility], primary, neuro   2. Impaired mobility: continue Sequential compression devices, below knee Bilateral lower extremities due to brain bleed 3. Headaches: Continue Norco for HA as needed and tylenol prn-    -11/20 topamax stopped by PA dt lethargy  -depakote 250mg  bid -> continue as outpt    3. Mood/sleep: emotional support- and monitor if needs  neuropsychology.   -antipsychotics: none  - sleep chart  -continue scheduled trazodone at night   4. Neuropsych- the patient is not  capable of making decisions for himself.  5. Skin/Wounds- routine wound care. Mainly with lower sacral stage one, tiny 24mm stage 2 open area , cont  foam dressing  6. Fluids/Electrolytes/Nutrition: eating next to nothing  -encourage PO  -cognitive component 7. HTN in setting of hypertensive emergency with orthostasis-   -entresto,  and aldactone       Vitals:   03/16/21 0417 03/16/21 0824  BP: (!) 123/55   Pulse: (!) 51   Resp: 18 18  Temp: 98.2 F (36.8 C)   SpO2: 94%     11/30 Control improved. still some lability but less   8. CHF with EF 40-45%- con't Entresto and aldactone .   Filed Weights   03/13/21 0404 03/14/21 0500 03/16/21 0421  Weight: 71 kg 70.8 kg 69.1 kg    one time dose of Lasix 10mg  11/26  -weight stable 11/28--f/u as outpt 9. COPD- con't O2 and wean as tolerated-also continue his regimen of COPD meds- including Pulmicort, Albuterol prn, Brovana. And change as required.  10. Constipation-      -  senna-s at HS scheduled 11. UTI- resolved- s/p IV rocephin 12. Smoker- tobacco- abuse- counseling and Nicoderm 21 mg daily 14. Hx of prostate CA s/p XRT seeding 16. GERD- con't protonix and monitor/change as required 17. AAA/CAD/HLD- con't to monitor 18. Knee pain. Considered TKA at one point -continue diclofenac gel- ?knee brace   -pt reports general improvement--continue voltaren LOS: 18 days A FACE TO FACE EVALUATION WAS PERFORMED  Meredith Staggers 03/16/2021, 8:42 AM

## 2021-03-16 NOTE — Progress Notes (Signed)
Inpatient Rehabilitation Care Coordinator Discharge Note   Patient Details  Name: [name] MRN: [id number] Date of Birth: February 03, 1946   Discharge location: D/c to home with support from family.  Length of Stay: 17 days  Discharge activity level: Supervision  Home/community participation: Limited  Patient response WU:JWJXBJ Literacy - How often do you need to have someone help you when you read instructions, pamphlets, or other written material from your doctor or pharmacy?: Never  Patient response YN:WGNFAO Isolation - How often do you feel lonely or isolated from those around you?: Never  Services provided included: MD, RD, PT, OT, SLP, RN, TR, Pharmacy, Neuropsych, SW, CM  Financial Services:  Charity fundraiser Utilized: Surveyor, quantity  Choices offered to/list presented to: yes  Follow-up services arranged:  Home Health, DME Home Health Agency: [facility] for HHPT/OT/SLP/aide    DME : [facility] for [facility] and RW    Patient response to transportation need: Is the patient able to respond to transportation needs?: Yes In the past 12 months, has lack of transportation kept you from medical appointments or from getting medications?: No In the past 12 months, has lack of transportation kept you from meetings, work, or from getting things needed for daily living?: No  Comments (or additional information):  Patient/Family verbalized understanding of follow-up arrangements:  Yes  Individual responsible for coordination of the follow-up plan: Contact pt [phone], pt wife [name] [phone] (she is currently in the hospital), son Shawn#[phone]  Confirmed correct DME delivered: Rana Snare 03/16/2021    Rana Snare

## 2021-03-16 NOTE — Discharge Summary (Signed)
Physician Discharge Summary  Patient ID: SAED HUDLOW MRN: [id number] DOB/AGE: August 04, 1945 75 y.o.  Admit date: 02/26/2021 Discharge date: 03/16/2021  Discharge Diagnoses:  Principal Problem:   Intraparenchymal hemorrhage of brain [facility]) Active Problems:   Pressure injury of skin   Orthostatic hypotension   Chronic combined systolic and diastolic congestive heart failure (HCC)   Vascular headache   Discharged Condition: good  Significant Diagnostic Studies: N/A   Labs:  Basic Metabolic Panel: Recent Labs  Lab 03/14/21 0711  NA 138  K 3.8  CL 104  CO2 26  GLUCOSE 109*  BUN 17  CREATININE 0.97  CALCIUM 9.0    CBC: Recent Labs  Lab 03/14/21 0711  WBC 7.6  NEUTROABS 5.5  HGB 15.3  HCT 45.4  MCV 93.8  PLT 204    CBG: Recent Labs  Lab 03/13/21 2115  GLUCAP 103*    Brief HPI:   [name] is a 75 y.o. male with history of CHF, HTN, prior CVA, COPD, prostate cancer, tobacco abuse who was admitted on 02/21/2021 after a fall with LOC.  Blood pressure elevated at admission and he was found to have SDH and IPH as well as cerebral edema.  He was intubated for airway protection briefly.  Neuro Dr. Christella Noa evaluated patient and felt a surgical intervention not needed.  He was found to have E. coli UTI which was treated with IV ceftriaxone.  He has been neurologically stable but continues to be limited by frontal headaches as well as fatigue and balance deficits.  CIR was recommended due to functional decline.   Hospital Course: JOURNEY RATTERMAN was admitted to rehab 02/26/2021 for inpatient therapies to consist of PT, ST and OT at least three hours five days a week. Past admission physiatrist, therapy team and rehab RN have worked together to provide customized collaborative inpatient rehab.  He continued to be limited by headaches and Topamax were added with some improvement however he was noted to have sedation on this therefore this was discontinued.  He was started  on Depakote and is tolerating this without side effects.  Bilateral knee pain has been managed with use of Voltaren gel.  Follow-up check of BMET showed electrolytes and renal status to be within normal limits.  He was noted to have partial thickness loss on his coccyx at admission and this was managed with use of foam dressing.  His weights were monitored daily and is stable without any signs of overload.  His blood pressures were monitored on TID basis and are showing better control.  He was weaned off oxygen and his respiratory status has been stable.  Senna was added to help manage constipation.  He is continent of bowel and bladder.  He has made steady gains during his rehab stay and requires supervision overall.  Min assist is recommended for cognitive tasks.  We will continue to receive follow-up home health PT, OT, ST and [place] aide by [facility] health after discharge.   Rehab course: During patient's stay in rehab weekly team conferences were held to monitor patient's progress, set goals and discuss barriers to discharge. At admission, patient required min assist with basic ADL tasks and with mobility. He exhibited mild cognitive impairments and was limited by pain and fatigue.  He has had improvement in activity tolerance, balance, postural control as well as ability to compensate for deficits.  He requires supervision to complete ADL tasks.  He requires supervision with cues for transfers and to ambulate 375  feet with rolling walker. His cognitive function continues to fluctuate throughout the day and he requires min assist with multimodal cues to complete functional and familiar tasks.  Family vacation was completed with son.  Discharge disposition: 01-Home or Self Care  Diet: Heart healthy.  Special Instructions: No driving or strenuous activity. Family to assist with medication management.  Discharge Instructions     Ambulatory referral to Physical Medicine Rehab   Complete by: As  directed    Hospital follow up      Allergies as of 03/16/2021       Reactions   Lyrica [pregabalin]    Swelling   Prednisone    Feels like having a heart attack. Has had the injection form before and does well. Oral Steroids        Medication List     STOP taking these medications    aspirin 81 MG tablet   montelukast 10 MG tablet Commonly known as: SINGULAIR       TAKE these medications    acetaminophen 325 MG tablet Commonly known as: TYLENOL Take 1-2 tablets (325-650 mg total) by mouth every 4 (four) hours as needed for mild pain.   albuterol 108 (90 Base) MCG/ACT inhaler Commonly known as: VENTOLIN HFA Inhale 2 puffs into the lungs every 6 (six) hours as needed for wheezing.   atorvastatin 80 MG tablet Commonly known as: LIPITOR Take 1 tablet (80 mg total) by mouth daily.   budesonide-formoterol 160-4.5 MCG/ACT inhaler Commonly known as: Symbicort Inhale 2 puffs into the lungs 2 (two) times daily.   carvedilol 3.125 MG tablet Commonly known as: COREG Take 1 tablet (3.125 mg total) by mouth 2 (two) times daily.   cyclobenzaprine 5 MG tablet Commonly known as: FLEXERIL Take 1 tablet (5 mg total) by mouth at bedtime.   diclofenac Sodium 1 % Gel Commonly known as: VOLTAREN Apply 2 g topically 3 (three) times daily.   Entresto 97-103 MG Generic drug: sacubitril-valsartan Take 1 tablet by mouth 2 (two) times daily.   HYDROcodone-acetaminophen 10-325 MG tablet--Rx#30 pills Commonly known as: NORCO Take 1 tablet by mouth every 4 (four) hours as needed for severe pain. Notes to patient: Limit to one pill per day as needed for severe pain.    multivitamin with minerals tablet Take 1 tablet by mouth daily.   nicotine 21 mg/24hr patch Commonly known as: NICODERM CQ - dosed in mg/24 hours Place 1 patch (21 mg total) onto the skin daily. Start taking on: March 17, 2021   omeprazole 40 MG capsule Commonly known as: PRILOSEC TAKE 1 CAPSULE BY  MOUTH DAILY USUALLY 30MINUTES BEFORE BREAKFAST What changed: See the new instructions.   polyethylene glycol powder 17 GM/SCOOP powder Commonly known as: GLYCOLAX/MIRALAX Take 17 g by mouth daily as needed for mild constipation.   senna-docusate 8.6-50 MG tablet Commonly known as: Senokot-S Take 2 tablets by mouth at bedtime.   spironolactone 25 MG tablet Commonly known as: ALDACTONE Take 0.5 tablets (12.5 mg total) by mouth daily.   valproic acid 250 MG capsule Commonly known as: DEPAKENE Take 1 capsule (250 mg total) by mouth 2 (two) times daily.        Follow-up Information     Meredith Staggers, MD Follow up.   Specialty: Physical Medicine and Rehabilitation Why: office will call you with follow up appointment Contact information: [address] [place] [address] [phone]         Ashok Pall, MD. Call.  Specialty: Neurosurgery Why: for follow up appointment and tell them that you will need repeat CT head. Contact information: [address] [place] [address] [place] [address] [phone]         Rusty Aus, MD. Call.   Specialty: Internal Medicine Why: for post hospital follow up Contact information: [place] [address] [phone]                 Signed: Bary Leriche 03/16/2021, 11:11 AM

## 2021-03-18 ENCOUNTER — Emergency Department (HOSPITAL_COMMUNITY): Payer: PPO

## 2021-03-18 ENCOUNTER — Inpatient Hospital Stay (HOSPITAL_COMMUNITY)
Admission: EM | Admit: 2021-03-18 | Discharge: 2021-03-28 | DRG: 329 | Disposition: A | Discharge status: Skilled Nursing Facility | Payer: PPO | Attending: Family Medicine | Admitting: Family Medicine

## 2021-03-18 ENCOUNTER — Inpatient Hospital Stay (HOSPITAL_COMMUNITY): Payer: PPO | Admitting: Anesthesiology

## 2021-03-18 ENCOUNTER — Encounter (HOSPITAL_COMMUNITY): Payer: Self-pay | Admitting: Emergency Medicine

## 2021-03-18 ENCOUNTER — Other Ambulatory Visit: Payer: Self-pay

## 2021-03-18 ENCOUNTER — Encounter (HOSPITAL_COMMUNITY)
Admission: EM | Disposition: A | Discharge status: Skilled Nursing Facility | Payer: Self-pay | Source: Home / Self Care | Attending: Family Medicine

## 2021-03-18 DIAGNOSIS — J449 Chronic obstructive pulmonary disease, unspecified: Secondary | ICD-10-CM | POA: Diagnosis not present

## 2021-03-18 DIAGNOSIS — K4001 Bilateral inguinal hernia, with obstruction, without gangrene, recurrent: Secondary | ICD-10-CM | POA: Diagnosis not present

## 2021-03-18 DIAGNOSIS — I2510 Atherosclerotic heart disease of native coronary artery without angina pectoris: Secondary | ICD-10-CM | POA: Diagnosis present

## 2021-03-18 DIAGNOSIS — Z79899 Other long term (current) drug therapy: Secondary | ICD-10-CM

## 2021-03-18 DIAGNOSIS — I619 Nontraumatic intracerebral hemorrhage, unspecified: Secondary | ICD-10-CM | POA: Diagnosis not present

## 2021-03-18 DIAGNOSIS — F17210 Nicotine dependence, cigarettes, uncomplicated: Secondary | ICD-10-CM | POA: Diagnosis not present

## 2021-03-18 DIAGNOSIS — D6959 Other secondary thrombocytopenia: Secondary | ICD-10-CM | POA: Diagnosis not present

## 2021-03-18 DIAGNOSIS — I252 Old myocardial infarction: Secondary | ICD-10-CM

## 2021-03-18 DIAGNOSIS — I5042 Chronic combined systolic (congestive) and diastolic (congestive) heart failure: Secondary | ICD-10-CM | POA: Diagnosis present

## 2021-03-18 DIAGNOSIS — Z720 Tobacco use: Secondary | ICD-10-CM | POA: Diagnosis present

## 2021-03-18 DIAGNOSIS — K4030 Unilateral inguinal hernia, with obstruction, without gangrene, not specified as recurrent: Principal | ICD-10-CM | POA: Diagnosis present

## 2021-03-18 DIAGNOSIS — Z833 Family history of diabetes mellitus: Secondary | ICD-10-CM

## 2021-03-18 DIAGNOSIS — Z8546 Personal history of malignant neoplasm of prostate: Secondary | ICD-10-CM

## 2021-03-18 DIAGNOSIS — M6281 Muscle weakness (generalized): Secondary | ICD-10-CM | POA: Diagnosis not present

## 2021-03-18 DIAGNOSIS — W1830XA Fall on same level, unspecified, initial encounter: Secondary | ICD-10-CM | POA: Diagnosis not present

## 2021-03-18 DIAGNOSIS — K460 Unspecified abdominal hernia with obstruction, without gangrene: Secondary | ICD-10-CM | POA: Diagnosis not present

## 2021-03-18 DIAGNOSIS — L89152 Pressure ulcer of sacral region, stage 2: Secondary | ICD-10-CM | POA: Diagnosis present

## 2021-03-18 DIAGNOSIS — K55019 Acute (reversible) ischemia of small intestine, extent unspecified: Secondary | ICD-10-CM | POA: Diagnosis not present

## 2021-03-18 DIAGNOSIS — R410 Disorientation, unspecified: Secondary | ICD-10-CM | POA: Diagnosis not present

## 2021-03-18 DIAGNOSIS — Z043 Encounter for examination and observation following other accident: Secondary | ICD-10-CM | POA: Diagnosis not present

## 2021-03-18 DIAGNOSIS — E876 Hypokalemia: Secondary | ICD-10-CM | POA: Diagnosis not present

## 2021-03-18 DIAGNOSIS — C61 Malignant neoplasm of prostate: Secondary | ICD-10-CM | POA: Diagnosis present

## 2021-03-18 DIAGNOSIS — M4326 Fusion of spine, lumbar region: Secondary | ICD-10-CM | POA: Diagnosis not present

## 2021-03-18 DIAGNOSIS — M545 Low back pain, unspecified: Secondary | ICD-10-CM | POA: Diagnosis not present

## 2021-03-18 DIAGNOSIS — Z66 Do not resuscitate: Secondary | ICD-10-CM | POA: Diagnosis present

## 2021-03-18 DIAGNOSIS — J432 Centrilobular emphysema: Secondary | ICD-10-CM | POA: Diagnosis present

## 2021-03-18 DIAGNOSIS — R531 Weakness: Secondary | ICD-10-CM | POA: Diagnosis not present

## 2021-03-18 DIAGNOSIS — K429 Umbilical hernia without obstruction or gangrene: Secondary | ICD-10-CM | POA: Diagnosis not present

## 2021-03-18 DIAGNOSIS — K420 Umbilical hernia with obstruction, without gangrene: Secondary | ICD-10-CM | POA: Diagnosis present

## 2021-03-18 DIAGNOSIS — Y92230 Patient room in hospital as the place of occurrence of the external cause: Secondary | ICD-10-CM | POA: Diagnosis not present

## 2021-03-18 DIAGNOSIS — W19XXXA Unspecified fall, initial encounter: Secondary | ICD-10-CM

## 2021-03-18 DIAGNOSIS — Z8673 Personal history of transient ischemic attack (TIA), and cerebral infarction without residual deficits: Secondary | ICD-10-CM

## 2021-03-18 DIAGNOSIS — D72828 Other elevated white blood cell count: Secondary | ICD-10-CM | POA: Diagnosis present

## 2021-03-18 DIAGNOSIS — S3993XA Unspecified injury of pelvis, initial encounter: Secondary | ICD-10-CM | POA: Diagnosis not present

## 2021-03-18 DIAGNOSIS — E785 Hyperlipidemia, unspecified: Secondary | ICD-10-CM | POA: Diagnosis present

## 2021-03-18 DIAGNOSIS — I6200 Nontraumatic subdural hemorrhage, unspecified: Secondary | ICD-10-CM | POA: Diagnosis not present

## 2021-03-18 DIAGNOSIS — I611 Nontraumatic intracerebral hemorrhage in hemisphere, cortical: Secondary | ICD-10-CM | POA: Diagnosis not present

## 2021-03-18 DIAGNOSIS — K219 Gastro-esophageal reflux disease without esophagitis: Secondary | ICD-10-CM | POA: Diagnosis not present

## 2021-03-18 DIAGNOSIS — R4182 Altered mental status, unspecified: Secondary | ICD-10-CM | POA: Diagnosis not present

## 2021-03-18 DIAGNOSIS — R000 Tachycardia, unspecified: Secondary | ICD-10-CM | POA: Diagnosis not present

## 2021-03-18 DIAGNOSIS — J441 Chronic obstructive pulmonary disease with (acute) exacerbation: Secondary | ICD-10-CM | POA: Diagnosis not present

## 2021-03-18 DIAGNOSIS — I69391 Dysphagia following cerebral infarction: Secondary | ICD-10-CM | POA: Diagnosis not present

## 2021-03-18 DIAGNOSIS — Z743 Need for continuous supervision: Secondary | ICD-10-CM | POA: Diagnosis not present

## 2021-03-18 DIAGNOSIS — Z7951 Long term (current) use of inhaled steroids: Secondary | ICD-10-CM | POA: Diagnosis not present

## 2021-03-18 DIAGNOSIS — M549 Dorsalgia, unspecified: Secondary | ICD-10-CM | POA: Diagnosis not present

## 2021-03-18 DIAGNOSIS — R2681 Unsteadiness on feet: Secondary | ICD-10-CM | POA: Diagnosis not present

## 2021-03-18 DIAGNOSIS — I7143 Infrarenal abdominal aortic aneurysm, without rupture: Secondary | ICD-10-CM | POA: Diagnosis not present

## 2021-03-18 DIAGNOSIS — I714 Abdominal aortic aneurysm, without rupture, unspecified: Secondary | ICD-10-CM | POA: Diagnosis present

## 2021-03-18 DIAGNOSIS — K56609 Unspecified intestinal obstruction, unspecified as to partial versus complete obstruction: Secondary | ICD-10-CM | POA: Diagnosis not present

## 2021-03-18 DIAGNOSIS — Z20822 Contact with and (suspected) exposure to covid-19: Secondary | ICD-10-CM | POA: Diagnosis not present

## 2021-03-18 DIAGNOSIS — M25552 Pain in left hip: Secondary | ICD-10-CM | POA: Diagnosis not present

## 2021-03-18 DIAGNOSIS — I110 Hypertensive heart disease with heart failure: Secondary | ICD-10-CM | POA: Diagnosis present

## 2021-03-18 DIAGNOSIS — K8020 Calculus of gallbladder without cholecystitis without obstruction: Secondary | ICD-10-CM | POA: Diagnosis not present

## 2021-03-18 DIAGNOSIS — Z801 Family history of malignant neoplasm of trachea, bronchus and lung: Secondary | ICD-10-CM | POA: Diagnosis not present

## 2021-03-18 DIAGNOSIS — R112 Nausea with vomiting, unspecified: Secondary | ICD-10-CM | POA: Diagnosis not present

## 2021-03-18 DIAGNOSIS — E279 Disorder of adrenal gland, unspecified: Secondary | ICD-10-CM | POA: Diagnosis not present

## 2021-03-18 DIAGNOSIS — I10 Essential (primary) hypertension: Secondary | ICD-10-CM | POA: Diagnosis not present

## 2021-03-18 DIAGNOSIS — N200 Calculus of kidney: Secondary | ICD-10-CM | POA: Diagnosis not present

## 2021-03-18 DIAGNOSIS — I739 Peripheral vascular disease, unspecified: Secondary | ICD-10-CM | POA: Diagnosis present

## 2021-03-18 DIAGNOSIS — R1312 Dysphagia, oropharyngeal phase: Secondary | ICD-10-CM | POA: Diagnosis not present

## 2021-03-18 DIAGNOSIS — Z8249 Family history of ischemic heart disease and other diseases of the circulatory system: Secondary | ICD-10-CM | POA: Diagnosis not present

## 2021-03-18 DIAGNOSIS — K658 Other peritonitis: Secondary | ICD-10-CM | POA: Diagnosis present

## 2021-03-18 DIAGNOSIS — G8918 Other acute postprocedural pain: Secondary | ICD-10-CM | POA: Diagnosis not present

## 2021-03-18 DIAGNOSIS — I639 Cerebral infarction, unspecified: Secondary | ICD-10-CM | POA: Diagnosis not present

## 2021-03-18 DIAGNOSIS — R0602 Shortness of breath: Secondary | ICD-10-CM | POA: Diagnosis not present

## 2021-03-18 HISTORY — PX: INSERTION OF MESH: SHX5868

## 2021-03-18 HISTORY — PX: BOWEL RESECTION: SHX1257

## 2021-03-18 HISTORY — PX: UMBILICAL HERNIA REPAIR: SHX196

## 2021-03-18 HISTORY — PX: INGUINAL HERNIA REPAIR: SHX194

## 2021-03-18 LAB — COMPREHENSIVE METABOLIC PANEL
ALT: 15 U/L (ref 0–44)
AST: 18 U/L (ref 15–41)
Albumin: 3.5 g/dL (ref 3.5–5.0)
Alkaline Phosphatase: 78 U/L (ref 38–126)
Anion gap: 9 (ref 5–15)
BUN: 23 mg/dL (ref 8–23)
CO2: 27 mmol/L (ref 22–32)
Calcium: 9.5 mg/dL (ref 8.9–10.3)
Chloride: 104 mmol/L (ref 98–111)
Creatinine, Ser: 1.09 mg/dL (ref 0.61–1.24)
GFR, Estimated: >60 mL/min (ref >60)
Glucose, Bld: 150 mg/dL — ABNORMAL HIGH (ref 70–99)
Potassium: 4.6 mmol/L (ref 3.5–5.1)
Sodium: 140 mmol/L (ref 135–145)
Total Bilirubin: 1.3 mg/dL — ABNORMAL HIGH (ref 0.3–1.2)
Total Protein: 6.4 g/dL — ABNORMAL LOW (ref 6.5–8.1)

## 2021-03-18 LAB — TROPONIN I (HIGH SENSITIVITY)
Troponin I (High Sensitivity): 14 ng/L (ref <18)
Troponin I (High Sensitivity): 16 ng/L (ref <18)

## 2021-03-18 LAB — CBC WITH DIFFERENTIAL/PLATELET
Abs Immature Granulocytes: 0.06 10*3/uL (ref 0.00–0.07)
Basophils Absolute: 0 10*3/uL (ref 0.0–0.1)
Basophils Relative: 0 %
Eosinophils Absolute: 0 10*3/uL (ref 0.0–0.5)
Eosinophils Relative: 0 %
HCT: 50.5 % (ref 39.0–52.0)
Hemoglobin: 16.9 g/dL (ref 13.0–17.0)
Immature Granulocytes: 0 %
Lymphocytes Relative: 5 %
Lymphs Abs: 0.7 10*3/uL (ref 0.7–4.0)
MCH: 31.9 pg (ref 26.0–34.0)
MCHC: 33.5 g/dL (ref 30.0–36.0)
MCV: 95.5 fL (ref 80.0–100.0)
Monocytes Absolute: 0.6 10*3/uL (ref 0.1–1.0)
Monocytes Relative: 4 %
Neutro Abs: 12.3 10*3/uL — ABNORMAL HIGH (ref 1.7–7.7)
Neutrophils Relative %: 91 %
Platelets: 227 10*3/uL (ref 150–400)
RBC: 5.29 MIL/uL (ref 4.22–5.81)
RDW: 12.8 % (ref 11.5–15.5)
WBC: 13.7 10*3/uL — ABNORMAL HIGH (ref 4.0–10.5)
nRBC: 0 % (ref 0.0–0.2)

## 2021-03-18 LAB — LIPASE, BLOOD: Lipase: 22 U/L (ref 11–51)

## 2021-03-18 LAB — VALPROIC ACID LEVEL: Valproic Acid Lvl: 22 ug/mL — ABNORMAL LOW (ref 50.0–100.0)

## 2021-03-18 LAB — RESP PANEL BY RT-PCR (FLU A&B, COVID) ARPGX2
Influenza A by PCR: NEGATIVE
Influenza B by PCR: NEGATIVE
SARS Coronavirus 2 by RT PCR: NEGATIVE

## 2021-03-18 LAB — CBG MONITORING, ED: Glucose-Capillary: 138 mg/dL — ABNORMAL HIGH (ref 70–99)

## 2021-03-18 LAB — LACTIC ACID, PLASMA: Lactic Acid, Venous: 1.6 mmol/L (ref 0.5–1.9)

## 2021-03-18 SURGERY — REPAIR, HERNIA, INGUINAL, ADULT
Anesthesia: General | Site: Groin

## 2021-03-18 MED ORDER — BUPIVACAINE LIPOSOME 1.3 % IJ SUSP
INTRAMUSCULAR | Status: DC | PRN
  Administered 2021-03-18: 10 mL via PERINEURAL

## 2021-03-18 MED ORDER — CYCLOBENZAPRINE HCL 10 MG PO TABS
5.0000 mg | ORAL_TABLET | Freq: Every day | ORAL | Status: DC
  Administered 2021-03-19 – 2021-03-27 (×9): 5 mg via ORAL
  Filled 2021-03-18 (×9): qty 1

## 2021-03-18 MED ORDER — ONDANSETRON HCL 4 MG/2ML IJ SOLN
INTRAMUSCULAR | Status: DC | PRN
  Administered 2021-03-18: 4 mg via INTRAVENOUS

## 2021-03-18 MED ORDER — ATORVASTATIN CALCIUM 40 MG PO TABS
80.0000 mg | ORAL_TABLET | Freq: Every day | ORAL | Status: DC
  Administered 2021-03-19 – 2021-03-28 (×10): 80 mg via ORAL
  Filled 2021-03-18 (×10): qty 2

## 2021-03-18 MED ORDER — ACETAMINOPHEN 325 MG PO TABS
650.0000 mg | ORAL_TABLET | Freq: Four times a day (QID) | ORAL | Status: DC | PRN
  Administered 2021-03-19 – 2021-03-20 (×3): 650 mg via ORAL
  Filled 2021-03-18 (×3): qty 2

## 2021-03-18 MED ORDER — IOHEXOL 300 MG/ML  SOLN
100.0000 mL | Freq: Once | INTRAMUSCULAR | Status: AC | PRN
  Administered 2021-03-18: 100 mL via INTRAVENOUS

## 2021-03-18 MED ORDER — EPHEDRINE SULFATE-NACL 50-0.9 MG/10ML-% IV SOSY
PREFILLED_SYRINGE | INTRAVENOUS | Status: DC | PRN
  Administered 2021-03-18: 10 mg via INTRAVENOUS

## 2021-03-18 MED ORDER — ACETAMINOPHEN 650 MG RE SUPP: 650.0000 mg | Freq: Four times a day (QID) | RECTAL | Status: DC | PRN

## 2021-03-18 MED ORDER — NICOTINE 21 MG/24HR TD PT24
21.0000 mg | MEDICATED_PATCH | Freq: Every day | TRANSDERMAL | Status: DC
  Administered 2021-03-19 – 2021-03-27 (×2): 21 mg via TRANSDERMAL
  Filled 2021-03-18 (×9): qty 1

## 2021-03-18 MED ORDER — VALPROIC ACID 250 MG PO CAPS
250.0000 mg | ORAL_CAPSULE | Freq: Two times a day (BID) | ORAL | Status: DC
  Administered 2021-03-19 – 2021-03-28 (×19): 250 mg via ORAL
  Filled 2021-03-18 (×21): qty 1

## 2021-03-18 MED ORDER — FENTANYL CITRATE (PF) 100 MCG/2ML IJ SOLN
25.0000 ug | INTRAMUSCULAR | Status: DC | PRN
  Administered 2021-03-18 (×2): 25 ug via INTRAVENOUS

## 2021-03-18 MED ORDER — SUCCINYLCHOLINE CHLORIDE 200 MG/10ML IV SOSY
PREFILLED_SYRINGE | INTRAVENOUS | Status: DC | PRN
  Administered 2021-03-18: 100 mg via INTRAVENOUS

## 2021-03-18 MED ORDER — SODIUM CHLORIDE 0.9 % IV SOLN
INTRAVENOUS | Status: AC
  Filled 2021-03-18: qty 2

## 2021-03-18 MED ORDER — PROPOFOL 10 MG/ML IV BOLUS
INTRAVENOUS | Status: AC
  Filled 2021-03-18: qty 20

## 2021-03-18 MED ORDER — ORAL CARE MOUTH RINSE: 15.0000 mL | Freq: Once | OROMUCOSAL | Status: AC

## 2021-03-18 MED ORDER — MORPHINE SULFATE (PF) 2 MG/ML IV SOLN
2.0000 mg | INTRAVENOUS | Status: DC | PRN
  Administered 2021-03-18 – 2021-03-20 (×3): 2 mg via INTRAVENOUS
  Filled 2021-03-18 (×3): qty 1

## 2021-03-18 MED ORDER — PANTOPRAZOLE SODIUM 40 MG PO TBEC
40.0000 mg | DELAYED_RELEASE_TABLET | Freq: Every day | ORAL | Status: DC
  Administered 2021-03-19 – 2021-03-28 (×10): 40 mg via ORAL
  Filled 2021-03-18 (×10): qty 1

## 2021-03-18 MED ORDER — FENTANYL CITRATE (PF) 100 MCG/2ML IJ SOLN
INTRAMUSCULAR | Status: AC
  Filled 2021-03-18: qty 2

## 2021-03-18 MED ORDER — FENTANYL CITRATE (PF) 100 MCG/2ML IJ SOLN: 50.0000 ug | Freq: Once | INTRAMUSCULAR | Status: AC

## 2021-03-18 MED ORDER — HYDRALAZINE HCL 20 MG/ML IJ SOLN: 10.0000 mg | INTRAMUSCULAR | Status: DC | PRN

## 2021-03-18 MED ORDER — DEXAMETHASONE SODIUM PHOSPHATE 10 MG/ML IJ SOLN
INTRAMUSCULAR | Status: DC | PRN
  Administered 2021-03-18: 10 mg via INTRAVENOUS

## 2021-03-18 MED ORDER — ALBUTEROL SULFATE (2.5 MG/3ML) 0.083% IN NEBU
3.0000 mL | INHALATION_SOLUTION | Freq: Four times a day (QID) | RESPIRATORY_TRACT | Status: DC | PRN
  Administered 2021-03-19 – 2021-03-27 (×3): 3 mL via RESPIRATORY_TRACT
  Filled 2021-03-18 (×4): qty 3

## 2021-03-18 MED ORDER — LACTATED RINGERS IV SOLN: INTRAVENOUS | Status: DC

## 2021-03-18 MED ORDER — ACETAMINOPHEN 10 MG/ML IV SOLN
INTRAVENOUS | Status: AC
  Filled 2021-03-18: qty 100

## 2021-03-18 MED ORDER — SUGAMMADEX SODIUM 200 MG/2ML IV SOLN
INTRAVENOUS | Status: DC | PRN
  Administered 2021-03-18: 200 mg via INTRAVENOUS

## 2021-03-18 MED ORDER — ROCURONIUM BROMIDE 10 MG/ML (PF) SYRINGE
PREFILLED_SYRINGE | INTRAVENOUS | Status: AC
  Filled 2021-03-18: qty 10

## 2021-03-18 MED ORDER — ROCURONIUM BROMIDE 10 MG/ML (PF) SYRINGE
PREFILLED_SYRINGE | INTRAVENOUS | Status: DC | PRN
  Administered 2021-03-18 (×2): 50 mg via INTRAVENOUS

## 2021-03-18 MED ORDER — ONDANSETRON HCL 4 MG/2ML IJ SOLN
INTRAMUSCULAR | Status: AC
  Filled 2021-03-18: qty 2

## 2021-03-18 MED ORDER — ONDANSETRON 4 MG PO TBDP: 4.0000 mg | ORAL_TABLET | Freq: Four times a day (QID) | ORAL | Status: DC | PRN

## 2021-03-18 MED ORDER — FENTANYL CITRATE (PF) 100 MCG/2ML IJ SOLN
INTRAMUSCULAR | Status: AC
  Administered 2021-03-18: 50 ug via INTRAVENOUS
  Filled 2021-03-18: qty 2

## 2021-03-18 MED ORDER — MIDAZOLAM HCL 2 MG/2ML IJ SOLN
INTRAMUSCULAR | Status: AC
  Filled 2021-03-18: qty 2

## 2021-03-18 MED ORDER — CARVEDILOL 3.125 MG PO TABS
3.1250 mg | ORAL_TABLET | Freq: Two times a day (BID) | ORAL | Status: DC
  Administered 2021-03-19 – 2021-03-28 (×18): 3.125 mg via ORAL
  Filled 2021-03-18 (×19): qty 1

## 2021-03-18 MED ORDER — PROPOFOL 10 MG/ML IV BOLUS
INTRAVENOUS | Status: DC | PRN
  Administered 2021-03-18: 120 mg via INTRAVENOUS

## 2021-03-18 MED ORDER — FENTANYL CITRATE (PF) 250 MCG/5ML IJ SOLN
INTRAMUSCULAR | Status: AC
  Filled 2021-03-18: qty 5

## 2021-03-18 MED ORDER — EPHEDRINE 5 MG/ML INJ
INTRAVENOUS | Status: AC
  Filled 2021-03-18: qty 10

## 2021-03-18 MED ORDER — SODIUM CHLORIDE 0.9 % IV SOLN
1.0000 g | INTRAVENOUS | Status: AC
  Administered 2021-03-18: 1 g via INTRAVENOUS

## 2021-03-18 MED ORDER — PHENYLEPHRINE 40 MCG/ML (10ML) SYRINGE FOR IV PUSH (FOR BLOOD PRESSURE SUPPORT)
PREFILLED_SYRINGE | INTRAVENOUS | Status: DC | PRN
  Administered 2021-03-18: 160 ug via INTRAVENOUS
  Administered 2021-03-18: 200 ug via INTRAVENOUS

## 2021-03-18 MED ORDER — LIDOCAINE 2% (20 MG/ML) 5 ML SYRINGE
INTRAMUSCULAR | Status: DC | PRN
  Administered 2021-03-18: 60 mg via INTRAVENOUS

## 2021-03-18 MED ORDER — DEXAMETHASONE SODIUM PHOSPHATE 10 MG/ML IJ SOLN
INTRAMUSCULAR | Status: AC
  Filled 2021-03-18: qty 1

## 2021-03-18 MED ORDER — CHLORHEXIDINE GLUCONATE 0.12 % MT SOLN
OROMUCOSAL | Status: AC
  Administered 2021-03-18: 15 mL
  Filled 2021-03-18: qty 15

## 2021-03-18 MED ORDER — BUPIVACAINE HCL (PF) 0.5 % IJ SOLN
INTRAMUSCULAR | Status: DC | PRN
  Administered 2021-03-18: 15 mL via PERINEURAL

## 2021-03-18 MED ORDER — PHENYLEPHRINE HCL-NACL 20-0.9 MG/250ML-% IV SOLN
INTRAVENOUS | Status: DC | PRN
  Administered 2021-03-18: 50 ug/min via INTRAVENOUS

## 2021-03-18 MED ORDER — ONDANSETRON HCL 4 MG/2ML IJ SOLN
4.0000 mg | Freq: Once | INTRAMUSCULAR | Status: AC
  Administered 2021-03-18: 4 mg via INTRAVENOUS
  Filled 2021-03-18: qty 2

## 2021-03-18 MED ORDER — SUGAMMADEX SODIUM 500 MG/5ML IV SOLN
INTRAVENOUS | Status: AC
  Filled 2021-03-18: qty 5

## 2021-03-18 MED ORDER — SACUBITRIL-VALSARTAN 97-103 MG PO TABS
1.0000 | ORAL_TABLET | Freq: Two times a day (BID) | ORAL | Status: DC
  Administered 2021-03-19 – 2021-03-28 (×19): 1 via ORAL
  Filled 2021-03-18 (×21): qty 1

## 2021-03-18 MED ORDER — 0.9 % SODIUM CHLORIDE (POUR BTL) OPTIME
TOPICAL | Status: DC | PRN
  Administered 2021-03-18 (×2): 1000 mL

## 2021-03-18 MED ORDER — ONDANSETRON HCL 4 MG/2ML IJ SOLN
4.0000 mg | Freq: Four times a day (QID) | INTRAMUSCULAR | Status: DC | PRN
  Administered 2021-03-22: 4 mg via INTRAVENOUS
  Filled 2021-03-18: qty 2

## 2021-03-18 MED ORDER — PHENYLEPHRINE 40 MCG/ML (10ML) SYRINGE FOR IV PUSH (FOR BLOOD PRESSURE SUPPORT)
PREFILLED_SYRINGE | INTRAVENOUS | Status: AC
  Filled 2021-03-18: qty 10

## 2021-03-18 MED ORDER — MOMETASONE FURO-FORMOTEROL FUM 200-5 MCG/ACT IN AERO
2.0000 | INHALATION_SPRAY | Freq: Two times a day (BID) | RESPIRATORY_TRACT | Status: DC
  Administered 2021-03-19 – 2021-03-28 (×18): 2 via RESPIRATORY_TRACT
  Filled 2021-03-18 (×2): qty 8.8

## 2021-03-18 MED ORDER — OXYCODONE HCL 5 MG PO TABS
5.0000 mg | ORAL_TABLET | ORAL | Status: DC | PRN
  Administered 2021-03-19 (×2): 10 mg via ORAL
  Administered 2021-03-20: 5 mg via ORAL
  Administered 2021-03-20 (×2): 10 mg via ORAL
  Administered 2021-03-21: 5 mg via ORAL
  Administered 2021-03-21: 10 mg via ORAL
  Administered 2021-03-22 (×3): 5 mg via ORAL
  Administered 2021-03-23: 10 mg via ORAL
  Administered 2021-03-25 – 2021-03-27 (×2): 5 mg via ORAL
  Filled 2021-03-18: qty 2
  Filled 2021-03-18: qty 1
  Filled 2021-03-18 (×2): qty 2
  Filled 2021-03-18 (×3): qty 1
  Filled 2021-03-18 (×2): qty 2
  Filled 2021-03-18: qty 1
  Filled 2021-03-18 (×2): qty 2
  Filled 2021-03-18 (×2): qty 1

## 2021-03-18 MED ORDER — LACTATED RINGERS IV BOLUS: 1000.0000 mL | Freq: Once | INTRAVENOUS | Status: DC

## 2021-03-18 MED ORDER — LIDOCAINE 2% (20 MG/ML) 5 ML SYRINGE
INTRAMUSCULAR | Status: AC
  Filled 2021-03-18: qty 5

## 2021-03-18 MED ORDER — FENTANYL CITRATE PF 50 MCG/ML IJ SOSY
25.0000 ug | PREFILLED_SYRINGE | Freq: Once | INTRAMUSCULAR | Status: AC
  Administered 2021-03-18: 25 ug via INTRAVENOUS
  Filled 2021-03-18: qty 1

## 2021-03-18 MED ORDER — FENTANYL CITRATE (PF) 250 MCG/5ML IJ SOLN
INTRAMUSCULAR | Status: DC | PRN
  Administered 2021-03-18: 25 ug via INTRAVENOUS
  Administered 2021-03-18 (×2): 50 ug via INTRAVENOUS
  Administered 2021-03-18: 25 ug via INTRAVENOUS
  Administered 2021-03-18: 50 ug via INTRAVENOUS

## 2021-03-18 MED ORDER — ACETAMINOPHEN 10 MG/ML IV SOLN
INTRAVENOUS | Status: DC | PRN
  Administered 2021-03-18: 1000 mg via INTRAVENOUS

## 2021-03-18 MED ORDER — CHLORHEXIDINE GLUCONATE 0.12 % MT SOLN: 15.0000 mL | Freq: Once | OROMUCOSAL | Status: AC

## 2021-03-18 MED ORDER — BUPIVACAINE LIPOSOME 1.3 % IJ SUSP
INTRAMUSCULAR | Status: AC
  Filled 2021-03-18: qty 20

## 2021-03-18 MED ORDER — ONDANSETRON HCL 4 MG/2ML IJ SOLN: 4.0000 mg | Freq: Once | INTRAMUSCULAR | Status: DC | PRN

## 2021-03-18 SURGICAL SUPPLY — 60 items
APL PRP STRL LF DISP 70% ISPRP (MISCELLANEOUS) ×2
APL SKNCLS STERI-STRIP NONHPOA (GAUZE/BANDAGES/DRESSINGS)
BAG COUNTER SPONGE SURGICOUNT (BAG) ×3 IMPLANT
BAG SPNG CNTER NS LX DISP (BAG) ×2
BAG SURGICOUNT SPONGE COUNTING (BAG) ×1
BENZOIN TINCTURE PRP APPL 2/3 (GAUZE/BANDAGES/DRESSINGS) IMPLANT
BLADE CLIPPER SURG (BLADE) IMPLANT
CANISTER SUCT 3000ML PPV (MISCELLANEOUS) ×4 IMPLANT
CELLS DAT CNTRL 66122 CELL SVR (MISCELLANEOUS) IMPLANT
CHLORAPREP W/TINT 26 (MISCELLANEOUS) ×4 IMPLANT
CLOSURE WOUND 1/2 X4 (GAUZE/BANDAGES/DRESSINGS)
COVER SURGICAL LIGHT HANDLE (MISCELLANEOUS) IMPLANT
DRAIN PENROSE 1/4X12 LTX STRL (WOUND CARE) IMPLANT
DRAPE LAPAROTOMY TRNSV 102X78 (DRAPES) ×4 IMPLANT
DRSG COVADERM 4X6 (GAUZE/BANDAGES/DRESSINGS) ×4 IMPLANT
DRSG OPSITE POSTOP 4X6 (GAUZE/BANDAGES/DRESSINGS) ×4 IMPLANT
ELECT CAUTERY BLADE 6.4 (BLADE) ×4 IMPLANT
ELECT REM PT RETURN 9FT ADLT (ELECTROSURGICAL) ×4
ELECTRODE REM PT RTRN 9FT ADLT (ELECTROSURGICAL) ×2 IMPLANT
GAUZE SPONGE 4X4 12PLY STRL (GAUZE/BANDAGES/DRESSINGS) ×4 IMPLANT
GLOVE SURG POLYISO LF SZ7 (GLOVE) ×4 IMPLANT
GLOVE SURG UNDER POLY LF SZ7 (GLOVE) ×4 IMPLANT
GOWN STRL REUS W/ TWL LRG LVL3 (GOWN DISPOSABLE) ×4 IMPLANT
GOWN STRL REUS W/TWL LRG LVL3 (GOWN DISPOSABLE) ×8
HANDLE SUCTION POOLE (INSTRUMENTS) IMPLANT
KIT BASIN OR (CUSTOM PROCEDURE TRAY) ×4 IMPLANT
KIT TURNOVER KIT B (KITS) ×4 IMPLANT
MESH PHASIX ST 15X20 (Mesh General) ×2 IMPLANT
NDL HYPO 25GX1X1/2 BEV (NEEDLE) IMPLANT
NEEDLE 22X1 1/2 (OR ONLY) (NEEDLE) ×4 IMPLANT
NEEDLE HYPO 25GX1X1/2 BEV (NEEDLE) IMPLANT
NS IRRIG 1000ML POUR BTL (IV SOLUTION) ×4 IMPLANT
PACK GENERAL/GYN (CUSTOM PROCEDURE TRAY) ×4 IMPLANT
PAD ARMBOARD 7.5X6 YLW CONV (MISCELLANEOUS) ×4 IMPLANT
PENCIL SMOKE EVACUATOR (MISCELLANEOUS) ×4 IMPLANT
RELOAD PROXIMATE TA60MM BLUE (ENDOMECHANICALS) ×4 IMPLANT
RELOAD STAPLE 60 BLU REG PROX (ENDOMECHANICALS) IMPLANT
RETRACTOR WND ALEXIS 18 MED (MISCELLANEOUS) IMPLANT
RTRCTR WOUND ALEXIS 18CM MED (MISCELLANEOUS)
RTRCTR WOUND ALEXIS 18CM SML (INSTRUMENTS)
SAVER CELL AAL HAEMONETICS (INSTRUMENTS) IMPLANT
STAPLER GUN LINEAR PROX 60 (STAPLE) ×2 IMPLANT
STAPLER PROXIMATE 75MM BLUE (STAPLE) ×2 IMPLANT
STRIP CLOSURE SKIN 1/2X4 (GAUZE/BANDAGES/DRESSINGS) IMPLANT
SUCTION POOLE HANDLE (INSTRUMENTS) ×4
SUT MNCRL AB 4-0 PS2 18 (SUTURE) ×4 IMPLANT
SUT PDS AB 1 CTX 36 (SUTURE) ×2 IMPLANT
SUT PROLENE 2 0 CT2 30 (SUTURE) ×12 IMPLANT
SUT SILK 0 TIES 10X30 (SUTURE) ×2 IMPLANT
SUT SILK 2 0 SH CR/8 (SUTURE) ×2 IMPLANT
SUT VIC AB 0 CT1 27 (SUTURE) ×8
SUT VIC AB 0 CT1 27XBRD ANBCTR (SUTURE) IMPLANT
SUT VIC AB 2-0 SH 18 (SUTURE) ×2 IMPLANT
SUT VIC AB 2-0 SH 27 (SUTURE) ×4
SUT VIC AB 2-0 SH 27X BRD (SUTURE) ×2 IMPLANT
SUT VIC AB 3-0 SH 27 (SUTURE) ×4
SUT VIC AB 3-0 SH 27XBRD (SUTURE) ×2 IMPLANT
SYR CONTROL 10ML LL (SYRINGE) IMPLANT
TOWEL GREEN STERILE (TOWEL DISPOSABLE) ×4 IMPLANT
TOWEL GREEN STERILE FF (TOWEL DISPOSABLE) ×4 IMPLANT

## 2021-03-18 NOTE — Transfer of Care (Signed)
Immediate Anesthesia Transfer of Care Note  Patient: [name]  Procedure(s) Performed: INGUINAL HERNIA REPAIR (Left: Groin) SMALL BOWEL RESECTION (Abdomen) INSERTION OF MESH (Left: Groin) UMBILICAL HERNIA REPAIR (Abdomen)  Patient Location: PACU  Anesthesia Type:General  Level of Consciousness: drowsy and patient cooperative  Airway & Oxygen Therapy: Patient Spontanous Breathing  Post-op Assessment: Report given to RN and Post -op Vital signs reviewed and stable  Post vital signs: Reviewed and stable  Last Vitals:  Vitals Value Taken Time  BP 148/101 03/18/21 1817  Temp    Pulse 71 03/18/21 1820  Resp 30 03/18/21 1820  SpO2 93 % 03/18/21 1820  Vitals shown include unvalidated device data.  Last Pain:  Vitals:   03/18/21 1547  TempSrc:   PainSc: 0-No pain         Complications: No notable events documented.

## 2021-03-18 NOTE — Anesthesia Postprocedure Evaluation (Signed)
Anesthesia Post Note  Patient: KAMAL JURGENS  Procedure(s) Performed: INGUINAL HERNIA REPAIR (Left: Groin) SMALL BOWEL RESECTION (Abdomen) INSERTION OF MESH (Left: Groin) UMBILICAL HERNIA REPAIR (Abdomen)     Patient location during evaluation: PACU Anesthesia Type: General Level of consciousness: awake and alert Pain management: pain level controlled Vital Signs Assessment: post-procedure vital signs reviewed and stable Respiratory status: spontaneous breathing, nonlabored ventilation and respiratory function stable Cardiovascular status: blood pressure returned to baseline and stable Postop Assessment: no apparent nausea or vomiting Anesthetic complications: no   No notable events documented.  Last Vitals:  Vitals:   03/18/21 1917 03/18/21 1947  BP: (!) 159/77 (!) 151/68  Pulse: 72 76  Resp: 20 20  Temp:    SpO2: 100% 95%    Last Pain:  Vitals:   03/18/21 1947  TempSrc:   PainSc: Asleep                 Azell Bill,W. EDMOND

## 2021-03-18 NOTE — Anesthesia Preprocedure Evaluation (Addendum)
Anesthesia Evaluation  Patient identified by MRN, date of birth, ID band Patient awake    Reviewed: Allergy & Precautions, NPO status , Patient's Chart, lab work & pertinent test results, reviewed documented beta blocker date and time   Airway Mallampati: II  TM Distance: >3 FB Neck ROM: Full    Dental  (+) Dental Advisory Given, Lower Dentures, Upper Dentures   Pulmonary COPD,  COPD inhaler, Current Smoker and Patient abstained from smoking.,    Pulmonary exam normal breath sounds clear to auscultation       Cardiovascular hypertension, Pt. on home beta blockers and Pt. on medications (-) angina+ CAD, + Past MI, + Cardiac Stents, + Peripheral Vascular Disease and +CHF  Normal cardiovascular exam Rhythm:Regular Rate:Normal  Echo 02/21/21: 1. Left ventricular ejection fraction, by estimation, is 40 to 45%. Left  ventricular ejection fraction by 2D MOD biplane is 44.2 %. The left  ventricle has mild to moderately decreased function. The left ventricle  demonstrates global hypokinesis. There  is mild left ventricular hypertrophy of the basal-septal segment. Left  ventricular diastolic parameters are consistent with Grade I diastolic  dysfunction (impaired relaxation).  2. Right ventricular systolic function is normal. The right ventricular  size is normal.  3. The mitral valve is normal in structure. No evidence of mitral valve  regurgitation.  4. The aortic valve was not well visualized. Aortic valve regurgitation  is not visualized.  5. The inferior vena cava is normal in size with <50% respiratory  variability, suggesting right atrial pressure of 8 mmHg.    Neuro/Psych  Headaches,  Neuromuscular disease CVA, No Residual Symptoms negative psych ROS   GI/Hepatic Neg liver ROS, GERD  Medicated,inguinal hernia    Endo/Other  negative endocrine ROS  Renal/GU negative Renal ROS     Musculoskeletal  (+) Arthritis ,    Abdominal   Peds  Hematology negative hematology ROS (+)   Anesthesia Other Findings Day of surgery medications reviewed with the patient.  Reproductive/Obstetrics                           Anesthesia Physical Anesthesia Plan  ASA: 3 and emergent  Anesthesia Plan: General   Post-op Pain Management: Ofirmev IV (intra-op)   Induction: Intravenous, Rapid sequence and Cricoid pressure planned  PONV Risk Score and Plan: 2 and Dexamethasone and Ondansetron  Airway Management Planned: Oral ETT  Additional Equipment:   Intra-op Plan:   Post-operative Plan: Extubation in OR  Informed Consent: I have reviewed the patients History and Physical, chart, labs and discussed the procedure including the risks, benefits and alternatives for the proposed anesthesia with the patient or authorized representative who has indicated his/her understanding and acceptance.   Patient has DNR.  Discussed DNR with patient and Continue DNR.   Dental advisory given  Plan Discussed with: CRNA  Anesthesia Plan Comments:        Anesthesia Quick Evaluation

## 2021-03-18 NOTE — ED Triage Notes (Signed)
Patient BIB [facility] from home, complaint of generally not feeling well. Was recently d/c from hospital for head bleed, states was doing well at home then yesterday just started to not feel well. VSS.

## 2021-03-18 NOTE — Anesthesia Procedure Notes (Signed)
Anesthesia Regional Block: TAP block   Pre-Anesthetic Checklist: , timeout performed,  Correct Patient, Correct Site, Correct Laterality,  Correct Procedure, Correct Position, site marked,  Risks and benefits discussed,  Surgical consent,  Pre-op evaluation,  At surgeon's request and post-op pain management  Laterality: Left  Prep: chloraprep       Needles:  Injection technique: Single-shot  Needle Type: Echogenic Needle     Needle Length: 9cm  Needle Gauge: 21     Additional Needles:   Procedures:,,,, ultrasound used (permanent image in chart),,    Narrative:  Start time: 03/18/2021 1:29 PM End time: 03/18/2021 1:36 PM Injection made incrementally with aspirations every 5 mL.  Performed by: Personally  Anesthesiologist: Catalina Gravel, MD  Additional Notes: No pain on injection. No increased resistance to injection. Injection made in 5cc increments.  Good needle visualization.  Patient tolerated procedure well.

## 2021-03-18 NOTE — ED Provider Notes (Signed)
[facility] EMERGENCY DEPARTMENT Provider Note   CSN: [id number] Arrival date & time: 03/18/21  0827     History Chief Complaint  Patient presents with   Weakness    [name] is a 75 y.o. male.   Pt is a 75y/o male with hx of CHF (EF 02/21/21 of 40-45%), HTN, prior CVA, COPD,  tobacco abuse, fall with intraparenchyma and SDH requiring rehab who was d/ced 2 days ago to home presenting today with general malaise, vomiting and reports just doesn't feel right.  Denies new cough or known fever.  No diarrhea or abd pain.  Reports feels like he had heart burn with some mild discomfort in the chest which has been constant.  Chronic cough but does not feel like it is any worse.  Denies any SOB.  He denies feeling dizzy or difficulty walking.  EMS reported that on their arrival patient sats were 92% on room air with normal heart rate and blood pressure.  Patient reports he took all of his medication yesterday but has not taken any medications today.  No known sick contacts.  The history is provided by the patient and medical records.  Weakness     Past Medical History:  Diagnosis Date   AAA (abdominal aortic aneurysm)    a.  CTA 7/19: measured 4.5 x 5.3 cm and greatest transverse dimensions   Abnormal nuclear stress test    a.  Myoview 2012: anterior wall ischemia with an estimated EF of 26%.  This was a new wall motion abnormality as well as a newly reduced EF   COPD (chronic obstructive pulmonary disease) (HCC)    Coronary artery disease    a.  Posterior MI in 2002 status post BMS to the LCx; b. [place] 2012: 95% stenosis of the proximal LAD, 95% stenosis of the mid LAD, 30% in-stent restenosis of the mid left circumflex with a second lesion of diffuse 50% stenosis.  The patient underwent successful PCI/BMS to the mid LAD with 0% residual stenosis, LV gram not performed   GERD (gastroesophageal reflux disease)    History of kidney stones    History of prostate cancer     Hyperlipidemia    Hypertension    Myocardial infarction [facility])    Neuromuscular disorder ([place])    Prostate cancer ([place])    a.  Status post seeding   PVD (peripheral vascular disease) ([place])    Stroke [facility])     Patient Active Problem List   Diagnosis Date Noted   Orthostatic hypotension    Chronic combined systolic and diastolic congestive heart failure (HCC)    Vascular headache    Pressure injury of skin 03/01/2021   Intraparenchymal hemorrhage of brain ([place]) 02/26/2021   SDH (subdural hematoma) 02/21/2021   Syncope 02/21/2021   Fall 02/21/2021   Hypertensive urgency 02/21/2021   HTN (hypertension) 02/21/2021   Stroke ([place]) 02/21/2021   Chronic combined systolic and diastolic CHF (congestive heart failure) ([place]) 02/21/2021   Leukocytosis 02/21/2021   Elevated troponin 02/21/2021   Zygomatic arch fracture ([place]) 02/21/2021   UTI (urinary tract infection) 02/21/2021   Sepsis ([place]) 02/21/2021   Other spondylosis with radiculopathy, lumbar region 09/04/2018   Centrilobular emphysema ([place]) 07/19/2018   Dilated cardiomyopathy ([place]) 07/19/2018   Coronary artery disease of native artery of native heart with stable angina pectoris ([place]) 01/20/2018   AAA (abdominal aortic aneurysm) without rupture 10/30/2017   Personal history of tobacco use, presenting hazards to health 08/29/2016  COPD exacerbation ([place]) 04/03/2015   Medicare annual wellness visit, subsequent 05/23/2014   Barrett's esophagus 05/20/2014   Constipation 05/20/2014   Arthritis of hand 05/07/2014   COPD (chronic obstructive pulmonary disease) ([place]) 11/06/2011   Pulmonary nodule 11/06/2011   Tobacco abuse 02/12/2011   Tobacco abuse counseling 02/12/2011   CAD, NATIVE VESSEL 10/18/2008   COLONIC POLYPS 08/05/2008   Hyperlipidemia 08/05/2008   Essential hypertension 08/05/2008   GERD 08/05/2008    Past Surgical History:  Procedure Laterality Date   BACK SURGERY  2009/2010   x 2   CARDIAC CATHETERIZATION  2002    stent placement [place]   ENDOVASCULAR REPAIR/STENT GRAFT N/A 01/02/2018   Procedure: ENDOVASCULAR REPAIR/STENT GRAFT;  Surgeon: Algernon Huxley, MD;  Location: [place] CV LAB;  Service: Cardiovascular;  Laterality: N/A;   FOOT SURGERY     bilateral    HERNIA REPAIR     bilateral    KNEE SURGERY     right knee    PROSTATE SURGERY  09/2009   prostate implant   SPINE SURGERY         Family History  Problem Relation Age of Onset   Heart attack Mother    Diabetes Mother    Heart disease Father    Lung cancer Daughter     Social History   Tobacco Use   Smoking status: Every Day    Packs/day: 0.25    Years: 56.00    Pack years: 14.00    Types: Cigarettes   Smokeless tobacco: Never   Tobacco comments:    back to smoking X1 year  Vaping Use   Vaping Use: Never used  Substance Use Topics   Alcohol use: No    Alcohol/week: 0.0 standard drinks   Drug use: No    Home Medications Prior to Admission medications   Medication Sig Start Date End Date Taking? Authorizing Provider  acetaminophen (TYLENOL) 325 MG tablet Take 1-2 tablets (325-650 mg total) by mouth every 4 (four) hours as needed for mild pain. 03/16/21   Love, Ivan Anchors, PA-C  albuterol (PROVENTIL HFA;VENTOLIN HFA) 108 (90 Base) MCG/ACT inhaler Inhale 2 puffs into the lungs every 6 (six) hours as needed for wheezing. 03/19/17 02/22/21  Juanito Doom, MD  atorvastatin (LIPITOR) 80 MG tablet Take 1 tablet (80 mg total) by mouth daily. 01/19/21   Minna Merritts, MD  budesonide-formoterol (SYMBICORT) 160-4.5 MCG/ACT inhaler Inhale 2 puffs into the lungs 2 (two) times daily. 12/12/18   Juanito Doom, MD  carvedilol (COREG) 3.125 MG tablet Take 1 tablet (3.125 mg total) by mouth 2 (two) times daily. 01/19/21   Minna Merritts, MD  cyclobenzaprine (FLEXERIL) 5 MG tablet Take 1 tablet (5 mg total) by mouth at bedtime. 03/16/21   Love, Ivan Anchors, PA-C  diclofenac Sodium (VOLTAREN) 1 % GEL Apply 2 g topically 3  (three) times daily. 03/16/21   Love, Ivan Anchors, PA-C  HYDROcodone-acetaminophen (NORCO) 10-325 MG tablet Take 1 tablet by mouth every 4 (four) hours as needed for severe pain. 03/16/21   Love, Ivan Anchors, PA-C  Multiple Vitamins-Minerals (MULTIVITAMIN WITH MINERALS) tablet Take 1 tablet by mouth daily.    [provider]  nicotine (NICODERM CQ - DOSED IN MG/24 HOURS) 21 mg/24hr patch Place 1 patch (21 mg total) onto the skin daily. 03/17/21   Love, Ivan Anchors, PA-C  omeprazole (PRILOSEC) 40 MG capsule TAKE 1 CAPSULE BY MOUTH DAILY USUALLY 30MINUTES BEFORE BREAKFAST Patient taking differently: Take 40  mg by mouth daily. 30 minutes before breakfast 01/28/19   Crecencio Mc, MD  polyethylene glycol powder (GLYCOLAX/MIRALAX) powder Take 17 g by mouth daily as needed for mild constipation. 04/24/11   [provider]  sacubitril-valsartan (ENTRESTO) 97-103 MG Take 1 tablet by mouth 2 (two) times daily. 01/19/21   Minna Merritts, MD  senna-docusate (SENOKOT-S) 8.6-50 MG tablet Take 2 tablets by mouth at bedtime. 03/16/21   Love, Ivan Anchors, PA-C  spironolactone (ALDACTONE) 25 MG tablet Take 0.5 tablets (12.5 mg total) by mouth daily. 01/19/21 07/18/21  Minna Merritts, MD  valproic acid (DEPAKENE) 250 MG capsule Take 1 capsule (250 mg total) by mouth 2 (two) times daily. 03/16/21   Love, Ivan Anchors, PA-C    Allergies    Lyrica [pregabalin] and Prednisone  Review of Systems   Review of Systems  Neurological:  Positive for weakness.  All other systems reviewed and are negative.  Physical Exam Updated Vital Signs BP (!) 144/87   Pulse 91   Temp 98 F (36.7 C) (Oral)   Resp 20   SpO2 90%   Physical Exam Vitals and nursing note reviewed.  Constitutional:      General: He is not in acute distress.    Appearance: He is well-developed. He is ill-appearing.     Comments: Appears chronically ill but not in extremis  HENT:     Head: Normocephalic and atraumatic.     Nose: Nose normal.      Mouth/Throat:     Mouth: Mucous membranes are dry.  Eyes:     Extraocular Movements: Extraocular movements intact.     Conjunctiva/sclera: Conjunctivae normal.     Pupils: Pupils are equal, round, and reactive to light.  Cardiovascular:     Rate and Rhythm: Normal rate and regular rhythm.     Heart sounds: No murmur heard. Pulmonary:     Effort: Pulmonary effort is normal. No respiratory distress.     Breath sounds: Normal breath sounds. No wheezing or rales.     Comments: Coarse cough and globally decreased breath sounds Abdominal:     General: There is no distension.     Palpations: Abdomen is soft.     Tenderness: There is no abdominal tenderness. There is no guarding or rebound.     Comments: Umbilical hernia that is easily reducible  Musculoskeletal:        General: No tenderness. Normal range of motion.     Cervical back: Normal range of motion and neck supple. No tenderness.     Right lower leg: No edema.     Left lower leg: No edema.  Skin:    General: Skin is warm and dry.     Capillary Refill: Capillary refill takes less than 2 seconds.     Findings: No erythema or rash.  Neurological:     Mental Status: He is alert and oriented to person, place, and time.     Comments: Moving all extremities.  Able to follow commands.  Pt not ambulated due to symptoms  Psychiatric:        Mood and Affect: Mood normal.        Behavior: Behavior normal.    ED Results / Procedures / Treatments   Labs (all labs ordered are listed, but only abnormal results are displayed) Labs Reviewed  CBC WITH DIFFERENTIAL/PLATELET - Abnormal; Notable for the following components:      Result Value   WBC 13.7 (*)    Neutro  Abs 12.3 (*)    All other components within normal limits  COMPREHENSIVE METABOLIC PANEL - Abnormal; Notable for the following components:   Glucose, Bld 150 (*)    Total Protein 6.4 (*)    Total Bilirubin 1.3 (*)    All other components within normal limits  VALPROIC  ACID LEVEL - Abnormal; Notable for the following components:   Valproic Acid Lvl 22 (*)    All other components within normal limits  CBG MONITORING, ED - Abnormal; Notable for the following components:   Glucose-Capillary 138 (*)    All other components within normal limits  RESP PANEL BY RT-PCR (FLU A&B, COVID) ARPGX2  LIPASE, BLOOD  LACTIC ACID, PLASMA  URINALYSIS, ROUTINE W REFLEX MICROSCOPIC  TROPONIN I (HIGH SENSITIVITY)  TROPONIN I (HIGH SENSITIVITY)    EKG EKG Interpretation  Date/Time:  Friday March 18 2021 08:39:11 EST Ventricular Rate:  92 PR Interval:  182 QRS Duration: 99 QT Interval:  361 QTC Calculation: 447 R Axis:   -59 Text Interpretation: Sinus rhythm Ventricular premature complex Abnormal R-wave progression, early transition LVH with secondary repolarization abnormality Inferior infarct, old No significant change since last tracing Confirmed by Blanchie Dessert [phone]) on 03/18/2021 8:51:11 AM  Radiology CT Head Wo Contrast  Result Date: 03/18/2021 CLINICAL DATA:  Nausea and vomiting EXAM: CT HEAD WITHOUT CONTRAST TECHNIQUE: Contiguous axial images were obtained from the base of the skull through the vertex without intravenous contrast. COMPARISON:  Head CT dated February 23, 2021 FINDINGS: Brain: Right subdural hemorrhage of the right cerebral convexity and right temporal lobe intraparenchymal hemorrhage are decreased in size and density when compared with prior exam. Approximately 5 mm of right to left midline shift, unchanged compared to prior. Basal cisterns are patent. Vascular: No hyperdense vessel or unexpected calcification. Skull: Normal. Negative for fracture or focal lesion. Sinuses/Orbits: No acute finding. Other: None. IMPRESSION: 1. Expected evolution of right subdural hemorrhage and right temporal lobe intraparenchymal hemorrhage. Similar mass effect with approximately 5 mm of leftward midline shift. 2. No new acute abnormality. Electronically Signed    By: Yetta Glassman M.D.   On: 03/18/2021 09:38   DG Chest Port 1 View  Result Date: 03/18/2021 CLINICAL DATA:  Shortness of breath, history of COPD and smoking. EXAM: PORTABLE CHEST 1 VIEW COMPARISON:  Chest radiograph 02/21/2021 FINDINGS: Interval removal of endotracheal tube and enteric tube. Stable cardiomediastinal silhouette with tortuous thoracic aorta. Aortic calcifications. Lungs are clear. No acute osseous abnormality. IMPRESSION: No acute cardiopulmonary abnormality. Aortic Atherosclerosis (ICD10-I70.0). Electronically Signed   By: Ileana Roup M.D.   On: 03/18/2021 09:08    Procedures Procedures   Medications Ordered in ED Medications  lactated ringers infusion ( Intravenous New Bag/Given 03/18/21 0859)  fentaNYL (SUBLIMAZE) injection 25 mcg (has no administration in time range)  ondansetron (ZOFRAN) injection 4 mg (4 mg Intravenous Given 03/18/21 6045)    ED Course  I have reviewed the triage vital signs and the nursing notes.  Pertinent labs & imaging results that were available during my care of the patient were reviewed by me and considered in my medical decision making (see chart for details).  Patient with a history of multiple medical problems who is presenting today with 24 hours of general malaise, nausea vomiting and he reports just feeling like there is something wrong.  Does describe a heartburn type sensation in his chest but it is not reproducible or exertional.  He did complain of headache to EMS but denies headache here.  Patient with recent subdural and intraparenchymal hemorrhage after LOC and a fall.  He is not currently on any anticoagulation.  Does have a significant heart history as well with an EF of 40 to 45% and is taking Entresto and Lasix.  He does not appear fluid overloaded today and low suspicion for CHF exacerbation.  However concern for NSTEMI versus cerebral edema causing recurrent nausea and vomiting versus COVID versus flu versus UTI as patient in  the past has had sepsis with E. coli UTI which caused his syncope and fall a month ago.  Patient started on an IV infusion but will avoid bolus until more findings available.  Blood pressure stable at this time.  He was given Zofran for nausea.  EKG shows significant ST depression and T wave inversion anterior laterally however this is unchanged from prior.  11:48 AM Patient's troponin is within normal limits today, EKG is unchanged, new leukocytosis of 13,000 but otherwise normal hemoglobin and CMP.  Head CT without acute findings and chest x-ray is clear.  On repeat evaluation patient still reports he is not feeling well and then mentions that he has a tender knot in his groin.  On further inspection it appears that patient has an incarcerated hernia.  Felt to be more indirect as it does not go down into the testicle.  It is tender to the touch and unable to be reduced at bedside.  We will give pain medication and reattempt to reduce.  CT abdomen pelvis pending.  1:19 PM CT consistent with incarcerated inguinal hernia with SBO.  After pain meds tried to reduce still will no luck.  Will consult general surgery.  Ice pack placed in the groin.  MDM   Amount and/or Complexity of Data Reviewed Clinical lab tests: ordered and reviewed Tests in the radiology section of CPT: ordered and reviewed Tests in the medicine section of CPT: ordered and reviewed Decide to obtain previous medical records or to obtain history from someone other than the patient: yes Obtain history from someone other than the patient: yes Review and summarize past medical records: yes Discuss the patient with other providers: yes Independent visualization of images, tracings, or specimens: yes     MDM Rules/Calculators/A&P                            Final Clinical Impression(s) / ED Diagnoses Final diagnoses:  Incarcerated inguinal hernia, unilateral  Small bowel obstruction ([place])    Rx / DC Orders ED Discharge  Orders     None        Blanchie Dessert, MD 03/18/21 1328

## 2021-03-18 NOTE — ED Notes (Signed)
Dr. Lorin Mercy released patient to go to the OR (vocal to [facility])

## 2021-03-18 NOTE — Consult Note (Addendum)
[name] June 09, 1945  [id number].    Requesting MD: Dr. Blanchie Dessert Chief Complaint/Reason for Consult: Incarcerated inguinal hernia   HPI: [name] is a 75 y.o. male with a hx of CHF (EF 40-45% on Echo 02/21/2021), HTN, HLD, prior CVA, GERD, COPD, GERD, PVD, prostate cancer (s/p of XTR seeding), CAD (s/p cirumflex stent 2002, LAD stent 2012), AAA, tobacco abuse who presented for generalized fatigue and n/v.   Patient reports being discharged from rehab on Wednesday 11/30.  On Thursday morning his son made him biscuits and gravy and then he vomited.  States that evening he did not tolerate his oral medications.  He continued to feel poorly and have nausea vomiting so he came to the hospital where he was found to have a left inguinal hernia that was not able to be reduced by ED physician. He underwent CT A/P that showed high-grade small bowel obstruction with transition point in the left inguinal hernia. WBC 13.7. We were asked to see.   To note, patient recently got out of rehab on 11/30 for a fall on 11/7 where he was found to have [place]. He had a Expected evolution of right subdural hemorrhage and right temporal lobe intraparenchymal hemorrhage with similar mass effect with approximately 5 mm of leftward midline shift.   ROS: Review of Systems  All other systems reviewed and are negative.  Family History  Problem Relation Age of Onset   Heart attack Mother    Diabetes Mother    Heart disease Father    Lung cancer Daughter     Past Medical History:  Diagnosis Date   AAA (abdominal aortic aneurysm)    a.  CTA 7/19: measured 4.5 x 5.3 cm and greatest transverse dimensions   Abnormal nuclear stress test    a.  Myoview 2012: anterior wall ischemia with an estimated EF of 26%.  This was a new wall motion abnormality as well as a newly reduced EF   COPD (chronic obstructive pulmonary disease) (HCC)    Coronary artery disease    a.  Posterior MI in 2002 status post  BMS to the LCx; b. [place] 2012: 95% stenosis of the proximal LAD, 95% stenosis of the mid LAD, 30% in-stent restenosis of the mid left circumflex with a second lesion of diffuse 50% stenosis.  The patient underwent successful PCI/BMS to the mid LAD with 0% residual stenosis, LV gram not performed   GERD (gastroesophageal reflux disease)    History of kidney stones    History of prostate cancer    Hyperlipidemia    Hypertension    Myocardial infarction [facility])    Neuromuscular disorder ([place])    Prostate cancer ([place])    a.  Status post seeding   PVD (peripheral vascular disease) ([place])    Stroke [facility])     Past Surgical History:  Procedure Laterality Date   BACK SURGERY  2009/2010   x 2   CARDIAC CATHETERIZATION  2002   stent placement [place]   ENDOVASCULAR REPAIR/STENT GRAFT N/A 01/02/2018   Procedure: ENDOVASCULAR REPAIR/STENT GRAFT;  Surgeon: Algernon Huxley, MD;  Location: [facility];  Service: Cardiovascular;  Laterality: N/A;   FOOT SURGERY     bilateral    HERNIA REPAIR     bilateral    KNEE SURGERY     right knee    PROSTATE SURGERY  09/2009   prostate implant   SPINE SURGERY      Social  History:  reports that he has been smoking cigarettes. He has a 14.00 pack-year smoking history. He has never used smokeless tobacco. He reports that he does not drink alcohol and does not use drugs.  Allergies:  Allergies  Allergen Reactions   Lyrica [Pregabalin]     Swelling    Prednisone     Feels like having a heart attack. Has had the injection form before and does well. Oral Steroids    (Not in a hospital admission)    Physical Exam: Blood pressure 104/69, pulse 87, temperature 98 F (36.7 C), temperature source Oral, resp. rate (!) 21, SpO2 (!) 89 %. General: pleasant, WD/WN white male who is laying in bed in NAD HEENT: head is normocephalic, atraumatic.  Sclera are noninjected.  PERRL.  Ears and nose without any masses or lesions.  Mouth is pink and moist.  Dentition fair Heart: regular, rate, and rhythm.  Normal s1,s2. No obvious murmurs, gallops, or rubs noted.  Palpable pedal pulses bilaterally  Lungs: CTAB, no wheezes, rhonchi, or rales noted.  Respiratory effort nonlabored Abd: Soft, mildly distended with left inguinal hernia that is ttp and unable to be reduced.  No overlying skin changes. Hypoactive BS. No masses or organomegaly. Additionally there is an umbilical hernia that is soft, nontender, reducible.  MS: no BUE/BLE edema, calves soft and nontender Skin: warm and dry with no masses, lesions, or rashes Psych: A&Ox4 with an appropriate affect Neuro: equal strength in BUE/BLE bilaterally, normal speech, thought process intact, moves all extremities, gait not assessed   Results for orders placed or performed during the hospital encounter of 03/18/21 (from the past 48 hour(s))  CBC with Differential/Platelet     Status: Abnormal   Collection Time: 03/18/21  8:40 AM  Result Value Ref Range   WBC 13.7 (H) 4.0 - 10.5 K/uL   RBC 5.29 4.22 - 5.81 MIL/uL   Hemoglobin 16.9 13.0 - 17.0 g/dL   HCT 50.5 39.0 - 52.0 %   MCV 95.5 80.0 - 100.0 fL   MCH 31.9 26.0 - 34.0 pg   MCHC 33.5 30.0 - 36.0 g/dL   RDW 12.8 11.5 - 15.5 %   Platelets 227 150 - 400 K/uL   nRBC 0.0 0.0 - 0.2 %   Neutrophils Relative % 91 %   Neutro Abs 12.3 (H) 1.7 - 7.7 K/uL   Lymphocytes Relative 5 %   Lymphs Abs 0.7 0.7 - 4.0 K/uL   Monocytes Relative 4 %   Monocytes Absolute 0.6 0.1 - 1.0 K/uL   Eosinophils Relative 0 %   Eosinophils Absolute 0.0 0.0 - 0.5 K/uL   Basophils Relative 0 %   Basophils Absolute 0.0 0.0 - 0.1 K/uL   Immature Granulocytes 0 %   Abs Immature Granulocytes 0.06 0.00 - 0.07 K/uL    Comment: Performed at [facility], [address] [place], [place] [address]  Comprehensive metabolic panel     Status: Abnormal   Collection Time: 03/18/21  8:40 AM  Result Value Ref Range   Sodium 140 135 - 145 mmol/L   Potassium 4.6 3.5 - 5.1 mmol/L    Chloride 104 98 - 111 mmol/L   CO2 27 22 - 32 mmol/L   Glucose, Bld 150 (H) 70 - 99 mg/dL    Comment: Glucose reference range applies only to samples taken after fasting for at least 8 hours.   BUN 23 8 - 23 mg/dL   Creatinine, Ser 1.09 0.61 - 1.24 mg/dL  Calcium 9.5 8.9 - 10.3 mg/dL   Total Protein 6.4 (L) 6.5 - 8.1 g/dL   Albumin 3.5 3.5 - 5.0 g/dL   AST 18 15 - 41 U/L   ALT 15 0 - 44 U/L   Alkaline Phosphatase 78 38 - 126 U/L   Total Bilirubin 1.3 (H) 0.3 - 1.2 mg/dL   GFR, Estimated >60 >60 mL/min    Comment: (NOTE) Calculated using the CKD-EPI Creatinine Equation (2021)    Anion gap 9 5 - 15    Comment: Performed at [facility] [address], [place], [place] [address]  Lipase, blood     Status: None   Collection Time: 03/18/21  8:40 AM  Result Value Ref Range   Lipase 22 11 - 51 U/L    Comment: Performed at [facility] [address], [place], [place] [address]  Lactic acid, plasma     Status: None   Collection Time: 03/18/21  8:40 AM  Result Value Ref Range   Lactic Acid, Venous 1.6 0.5 - 1.9 mmol/L    Comment: Performed at [facility] [address] [place], [place] [address]  Troponin I (High Sensitivity)     Status: None   Collection Time: 03/18/21  8:40 AM  Result Value Ref Range   Troponin I (High Sensitivity) 14 <18 ng/L    Comment: (NOTE) Elevated high sensitivity troponin I (hsTnI) values and significant  changes across serial measurements may suggest ACS but many other  chronic and acute conditions are known to elevate hsTnI results.  Refer to the "Links" section for chest pain algorithms and additional  guidance. Performed at [facility], [place] [address], [place], [place] [address]   Valproic acid level     Status: Abnormal   Collection Time: 03/18/21  8:40 AM  Result Value Ref Range   Valproic Acid Lvl 22 (L) 50.0 - 100.0 ug/mL    Comment: Performed at [facility] [address], [place], [place] [address]   CBG monitoring, ED     Status: Abnormal   Collection Time: 03/18/21  8:41 AM  Result Value Ref Range   Glucose-Capillary 138 (H) 70 - 99 mg/dL    Comment: Glucose reference range applies only to samples taken after fasting for at least 8 hours.  Resp Panel by RT-PCR (Flu A&B, Covid) Nasopharyngeal Swab     Status: None   Collection Time: 03/18/21  8:45 AM   Specimen: Nasopharyngeal Swab; Nasopharyngeal(NP) swabs in vial transport medium  Result Value Ref Range   SARS Coronavirus 2 by RT PCR NEGATIVE NEGATIVE    Comment: (NOTE) SARS-CoV-2 target nucleic acids are NOT DETECTED.  The SARS-CoV-2 RNA is generally detectable in upper respiratory specimens during the acute phase of infection. The lowest concentration of SARS-CoV-2 viral copies this assay can detect is 138 copies/mL. A negative result does not preclude SARS-Cov-2 infection and should not be used as the sole basis for treatment or other patient management decisions. A negative result may occur with  improper specimen collection/handling, submission of specimen other than nasopharyngeal swab, presence of viral mutation(s) within the areas targeted by this assay, and inadequate number of viral copies(<138 copies/mL). A negative result must be combined with clinical observations, patient history, and epidemiological information. The expected result is Negative.  Fact Sheet for Patients:  EntrepreneurPulse.com.au  Fact Sheet for Healthcare Providers:  IncredibleEmployment.be  This test is no t yet approved or cleared by the [place]  States FDA and  has been authorized for detection and/or diagnosis of SARS-CoV-2 by FDA under an Emergency Use Authorization (EUA). This EUA will remain  in effect (meaning this test can be used) for the duration of the COVID-19 declaration under Section 564(b)(1) of the Act, 21 U.S.C.section 360bbb-3(b)(1), unless the authorization is terminated  or revoked  sooner.       Influenza A by PCR NEGATIVE NEGATIVE   Influenza B by PCR NEGATIVE NEGATIVE    Comment: (NOTE) The Xpert Xpress SARS-CoV-2/FLU/RSV plus assay is intended as an aid in the diagnosis of influenza from Nasopharyngeal swab specimens and should not be used as a sole basis for treatment. Nasal washings and aspirates are unacceptable for Xpert Xpress SARS-CoV-2/FLU/RSV testing.  Fact Sheet for Patients: EntrepreneurPulse.com.au  Fact Sheet for Healthcare Providers: IncredibleEmployment.be  This test is not yet approved or cleared by the [place] FDA and has been authorized for detection and/or diagnosis of SARS-CoV-2 by FDA under an Emergency Use Authorization (EUA). This EUA will remain in effect (meaning this test can be used) for the duration of the COVID-19 declaration under Section 564(b)(1) of the Act, 21 U.S.C. section 360bbb-3(b)(1), unless the authorization is terminated or revoked.  Performed at [facility], [place] [address], [place], [place] [address]    CT Head Wo Contrast  Result Date: 03/18/2021 CLINICAL DATA:  Nausea and vomiting EXAM: CT HEAD WITHOUT CONTRAST TECHNIQUE: Contiguous axial images were obtained from the base of the skull through the vertex without intravenous contrast. COMPARISON:  Head CT dated February 23, 2021 FINDINGS: Brain: Right subdural hemorrhage of the right cerebral convexity and right temporal lobe intraparenchymal hemorrhage are decreased in size and density when compared with prior exam. Approximately 5 mm of right to left midline shift, unchanged compared to prior. Basal cisterns are patent. Vascular: No hyperdense vessel or unexpected calcification. Skull: Normal. Negative for fracture or focal lesion. Sinuses/Orbits: No acute finding. Other: None. IMPRESSION: 1. Expected evolution of right subdural hemorrhage and right temporal lobe intraparenchymal hemorrhage. Similar mass effect with  approximately 5 mm of leftward midline shift. 2. No new acute abnormality. Electronically Signed   By: Yetta Glassman M.D.   On: 03/18/2021 09:38   CT ABDOMEN PELVIS W CONTRAST  Result Date: 03/18/2021 CLINICAL DATA:  Abdominal pain, hernia suspected EXAM: CT ABDOMEN AND PELVIS WITH CONTRAST TECHNIQUE: Multidetector CT imaging of the abdomen and pelvis was performed using the standard protocol following bolus administration of intravenous contrast. CONTRAST:  148mL OMNIPAQUE IOHEXOL 300 MG/ML  SOLN COMPARISON:  CT abdomen and pelvis 10/30/2017 FINDINGS: Lower chest: No acute process identified. Hepatobiliary: Liver is normal in size and contour with no suspicious mass identified. Small hypodensity adjacent to the falciform ligament likely represents focal fatty infiltration. Gallbladder is mildly distended and contains 1.4 cm calculus in the neck. No gallbladder wall thickening or pericholecystic edema. No biliary ductal dilatation identified. Pancreas: Atrophic with no suspicious mass or ductal dilatation visualized. Spleen: Normal in size without focal abnormality. Adrenals/Urinary Tract: 1.2 cm left adrenal gland nodule, stable and likely adenoma. Also stable appearing right adrenal gland nodularity measuring 1.2 cm. Two small nonobstructing calculi identified in the left kidney measuring up to 3 mm in the upper pole. Small hypodense cyst in the lower right kidney. No enhancing renal mass or hydronephrosis identified bilaterally. Urinary bladder appears grossly normal. Stomach/Bowel: Numerous loops of abnormally distended small bowel with air-fluid levels measuring up to 3.7 cm in diameter. There is a transition point identified  within a left inguinal hernia which contains fat and a loop of small bowel. No pneumatosis or free air identified. Mild colonic diverticulosis. Appendix is normal. Vascular/Lymphatic: Severe atherosclerotic disease. Aorto bi-iliac stent graft which is patent, for the fusiform  infrarenal abdominal aortic aneurysm seen on previous studies, which measures up to 3.8 cm in diameter. No bulky lymphadenopathy identified. Reproductive: Brachytherapy seeds in the prostate. Other: Umbilical hernia containing fat.  No ascites. Musculoskeletal: Degenerative and postsurgical changes of the lumbar spine. IMPRESSION: 1. High-grade small bowel obstruction with transition point in the left inguinal hernia. Surgical consultation recommended. 2. Cholelithiasis. 3. Left nephrolithiasis. Bilateral adrenal gland nodules, likely adenomas. 4. Multiple additional chronic findings as described. Findings discussed with Dr. Maryan Rued over the telephone at 1:09 p.m. on 03/18/2021 with read back. Electronically Signed   By: Ofilia Neas M.D.   On: 03/18/2021 13:19   DG Chest Port 1 View  Result Date: 03/18/2021 CLINICAL DATA:  Shortness of breath, history of COPD and smoking. EXAM: PORTABLE CHEST 1 VIEW COMPARISON:  Chest radiograph 02/21/2021 FINDINGS: Interval removal of endotracheal tube and enteric tube. Stable cardiomediastinal silhouette with tortuous thoracic aorta. Aortic calcifications. Lungs are clear. No acute osseous abnormality. IMPRESSION: No acute cardiopulmonary abnormality. Aortic Atherosclerosis (ICD10-I70.0). Electronically Signed   By: Ileana Roup M.D.   On: 03/18/2021 09:08    Anti-infectives (From admission, onward)    None       Assessment/Plan Incarcerated left inguinal hernia  - Plan for OR today, open left inguinal hernia repair with possible mesh, possible bowel resection - Recommend medicine admission given multiple medical conditions listed below  FEN - NPO VTE - SCDs, Lovenox ID - Cefotetan periop   SDH/IPH - after fall 11/7. Recently discharged from [facility] 11/30. CTH w/ expected evolution  Hx CHF (EF 40-45% on Echo 02/21/2021) Hx HTN Hx HLD Remote hx of prior CVA GERD COPD GERD PVD Remote hx of prostate cancer CAD (s/p cirumflex stent 2002, LAD stent  2012) AAA Tobacco abuse  Obie Dredge, [facility] Surgery 03/18/2021, 1:41 PM Please see Amion for pager number during day hours 7:00am-4:30pm

## 2021-03-18 NOTE — Op Note (Signed)
   Operative Note   Date: 03/18/2021  Procedure: open left recurrent, strangulated inguinal hernia repair with phasix ST absorbable mesh, exploratory laparotomy, small bowel resection with primary anastomosis, primary repair of umbilical hernia  Pre-op diagnosis: incarcerated left inguinal hernia, reducible umbilical hernia Post-op diagnosis: strangulated left inguinal hernia, incarcerated umbilical hernia, 4cm  Indication and clinical history: The patient is a 75 y.o. year old male with an incarcerated inguinal hernia     Surgeon: Jesusita Oka, MD  Anesthesiologist: Gifford Shave, MD Anesthesia: General  Findings:  Specimen: none EBL: 25cc Drains/Implants: phasix ST mesh  Disposition: PACU - hemodynamically stable.  Description of procedure: The patient was positioned supine on the operating room table. General anesthetic induction and intubation were uneventful. Foley catheter insertion was performed and was atraumatic. Time-out was performed verifying correct patient, procedure, signature of informed consent, and administration of pre-operative antibiotics. The abdomen and groin were prepped and draped in the usual sterile fashion.  An incision was made approximately 2/3 distance from the ASIS to the pubic tubercle and deepened through Scarpa's fascia until the external oblique aponeurosis was reached. The external oblique aponeurosis was entered sharply and opened using Metzenbaum scissors, so as to avoid injuring the ilioinguinal nerve. There was significant amount of scar tissue c/w prior inguinal hernia repair, but no visible or palpable mesh. The bowel was unable to be manually reduced and the neck of the hernia unable to be opened, so a laparotomy was performed through the site of the umbilical hernia. The bowel was able to be manually reduced and was noted to be frankly ischemic and necrotic. A small segmental small bowel resection was performed of this region and a primary stapled  anastomosis created that was confirmed to be widely patent. I then returned to the groin incision and isolated and encircled the spermatic cord with a Penrose drain. A piece of phasix mesh was cut to fit and sutured to the pubic tubercle with a zero vicryl suture and run along the shelving edge inferiorly and the conjoined tendon superiorly. A slit was made in the center of the mesh to accommodate the cord structures and a vicryl suture was used to create a snug fit around it. The ends of the mesh were overlapped beneath the external oblique aponeurosis. The external oblique was closed with a 2-0 vicryl suture and Scarpa's closed with a 3-0 vicryl. The skin was closed with staples. The abdomen was then irrigated and the fluid returned clear. The fascia was closed with #1 looped PDS and the skin closed with staples.    Sterile dressings were applied. All sponge and instrument counts were correct at the conclusion of the procedure. The patient was awakened from anesthesia, extubated uneventfully, and transported to the PACU in good condition. There were no complications.   Upon entering the abdomen (organ space), I encountered feculent peritonitis.  CASE DATA:  Type of patient?: DOW CASE (Surgical Hospitalist [facility] Inpatient)  Status of Case? URGENT Add On  Infection Present At Time Of Surgery (PATOS)?  FECULENT PERITONITIS    Jesusita Oka, MD General and [place] Surgery

## 2021-03-18 NOTE — H&P (Signed)
History and Physical    [name]:[id number] DOB: 12/22/1945 DOA: 03/18/2021  PCP: Rusty Aus, MD Consultants:  Rockey Situ - cardiology; Lucky Cowboy - vascular surgery; Prescott Parma - orthopedics Patient coming from:  Home - lives with wife; NOK: Wife, Nettie Cromwell, [phone]  Chief Complaint: N/V  HPI: [name] is a 75 y.o. male with medical history significant of AAA; COPD; CAD; chronic systolic CHF; PAD; HTN; HLD; and prostate CA presenting with n/v.   He was previously admitted to the neurosurgery service from 11/7-12 with SDH and was discharged to [facility] and remained there through 11/30.  He reports that he progressed well while there and had no apparent issues.  However, he arrived home on 11/30 and started vomiting shortly thereafter with any attempt at food/drink intake.   He has noticed a bulge in his left groin.  He was found to have an incarcerated hernia and is going to the OR now.  Of note, his wife has had a complicated hospitalization following hip fracture.  He reports that she was discharged today, after 5 weeks of hospitalization.      ED Course: Incarcerated inguinal hernia, started vomiting yesterday.  Fall with LOC, intracranial hemorrhage, d/c from CIR on 11/30.  Review of Systems: As per HPI; otherwise review of systems reviewed and negative.   Ambulatory Status:  Ambulates with a cane  COVID Vaccine Status:  Complete plus boosters  Past Medical History:  Diagnosis Date   AAA (abdominal aortic aneurysm)    a.  CTA 7/19: measured 4.5 x 5.3 cm and greatest transverse dimensions   Abnormal nuclear stress test    a.  Myoview 2012: anterior wall ischemia with an estimated EF of 26%.  This was a new wall motion abnormality as well as a newly reduced EF   COPD (chronic obstructive pulmonary disease) (HCC)    Coronary artery disease    a.  Posterior MI in 2002 status post BMS to the LCx; b. [place] 2012: 95% stenosis of the proximal LAD, 95% stenosis of the mid LAD, 30%  in-stent restenosis of the mid left circumflex with a second lesion of diffuse 50% stenosis.  The patient underwent successful PCI/BMS to the mid LAD with 0% residual stenosis, LV gram not performed   GERD (gastroesophageal reflux disease)    History of kidney stones    History of prostate cancer    Hyperlipidemia    Hypertension    Myocardial infarction [facility])    Neuromuscular disorder ([place])    Prostate cancer ([place])    a.  Status post seeding   PVD (peripheral vascular disease) ([place])    Stroke [facility])     Past Surgical History:  Procedure Laterality Date   BACK SURGERY  2009/2010   x 2   CARDIAC CATHETERIZATION  2002   stent placement [place]   ENDOVASCULAR REPAIR/STENT GRAFT N/A 01/02/2018   Procedure: ENDOVASCULAR REPAIR/STENT GRAFT;  Surgeon: Algernon Huxley, MD;  Location: [place] [facility];  Service: Cardiovascular;  Laterality: N/A;   FOOT SURGERY     bilateral    HERNIA REPAIR     bilateral    KNEE SURGERY     right knee    PROSTATE SURGERY  09/2009   prostate implant   SPINE SURGERY      Social History   Socioeconomic History   Marital status: Married    Spouse name: Not on file   Number of children: Not on file   Years of  education: Not on file   Highest education level: Not on file  Occupational History   Not on file  Tobacco Use   Smoking status: Every Day    Packs/day: 0.25    Years: 56.00    Pack years: 14.00    Types: Cigarettes   Smokeless tobacco: Never   Tobacco comments:    back to smoking X1 year  Vaping Use   Vaping Use: Never used  Substance and Sexual Activity   Alcohol use: No    Alcohol/week: 0.0 standard drinks   Drug use: No   Sexual activity: Not on file  Other Topics Concern   Not on file  Social History Narrative   Not on file   Social Determinants of Health   Financial Resource Strain: Not on file  Food Insecurity: Not on file  Transportation Needs: Not on file  Physical Activity: Not on file  Stress: Not on file   Social Connections: Not on file  Intimate Partner Violence: Not on file    Allergies  Allergen Reactions   Lyrica [Pregabalin]     Swelling    Prednisone     Feels like having a heart attack. Has had the injection form before and does well. Oral Steroids    Family History  Problem Relation Age of Onset   Heart attack Mother    Diabetes Mother    Heart disease Father    Lung cancer Daughter     Prior to Admission medications   Medication Sig Start Date End Date Taking? Authorizing Provider  acetaminophen (TYLENOL) 325 MG tablet Take 1-2 tablets (325-650 mg total) by mouth every 4 (four) hours as needed for mild pain. 03/16/21   Love, Ivan Anchors, PA-C  albuterol (PROVENTIL HFA;VENTOLIN HFA) 108 (90 Base) MCG/ACT inhaler Inhale 2 puffs into the lungs every 6 (six) hours as needed for wheezing. 03/19/17 02/22/21  Juanito Doom, MD  atorvastatin (LIPITOR) 80 MG tablet Take 1 tablet (80 mg total) by mouth daily. 01/19/21   Minna Merritts, MD  budesonide-formoterol (SYMBICORT) 160-4.5 MCG/ACT inhaler Inhale 2 puffs into the lungs 2 (two) times daily. 12/12/18   Juanito Doom, MD  carvedilol (COREG) 3.125 MG tablet Take 1 tablet (3.125 mg total) by mouth 2 (two) times daily. 01/19/21   Minna Merritts, MD  cyclobenzaprine (FLEXERIL) 5 MG tablet Take 1 tablet (5 mg total) by mouth at bedtime. 03/16/21   Love, Ivan Anchors, PA-C  diclofenac Sodium (VOLTAREN) 1 % GEL Apply 2 g topically 3 (three) times daily. 03/16/21   Love, Ivan Anchors, PA-C  HYDROcodone-acetaminophen (NORCO) 10-325 MG tablet Take 1 tablet by mouth every 4 (four) hours as needed for severe pain. 03/16/21   Love, Ivan Anchors, PA-C  Multiple Vitamins-Minerals (MULTIVITAMIN WITH MINERALS) tablet Take 1 tablet by mouth daily.    [provider]  nicotine (NICODERM CQ - DOSED IN MG/24 HOURS) 21 mg/24hr patch Place 1 patch (21 mg total) onto the skin daily. 03/17/21   Love, Ivan Anchors, PA-C  omeprazole (PRILOSEC) 40 MG  capsule TAKE 1 CAPSULE BY MOUTH DAILY USUALLY 30MINUTES BEFORE BREAKFAST Patient taking differently: Take 40 mg by mouth daily. 30 minutes before breakfast 01/28/19   Crecencio Mc, MD  polyethylene glycol powder (GLYCOLAX/MIRALAX) powder Take 17 g by mouth daily as needed for mild constipation. 04/24/11   [provider]  sacubitril-valsartan (ENTRESTO) 97-103 MG Take 1 tablet by mouth 2 (two) times daily. 01/19/21   Minna Merritts, MD  senna-docusate (SENOKOT-S) 8.6-50 MG tablet Take 2 tablets by mouth at bedtime. 03/16/21   Love, Ivan Anchors, PA-C  spironolactone (ALDACTONE) 25 MG tablet Take 0.5 tablets (12.5 mg total) by mouth daily. 01/19/21 07/18/21  Minna Merritts, MD  valproic acid (DEPAKENE) 250 MG capsule Take 1 capsule (250 mg total) by mouth 2 (two) times daily. 03/16/21   Bary Leriche, PA-C    Physical Exam: Vitals:   03/18/21 1045 03/18/21 1115 03/18/21 1230 03/18/21 1245  BP: 130/81 (!) 144/87 (!) 107/56 104/69  Pulse: 90 91 89 87  Resp: (!) 25 20 (!) 21 (!) 21  Temp:      TempSrc:      SpO2: 90% 90% 90% (!) 89%     General:  Appears calm and comfortable and is in NAD Eyes:  PERRL, EOMI, normal lids, iris ENT:  grossly normal hearing, lips & tongue, mmm; appropriate dentition Neck:  no LAD, masses or thyromegaly Cardiovascular:  RRR, no m/r/g. No LE edema.  Respiratory:   CTA bilaterally with no wheezes/rales/rhonchi.  Normal respiratory effort. Abdomen:  soft, +umbilical hernia (reducible), +L inguinal bulge with LLQ TTP, hypoactive BS Skin:  no rash or induration seen on limited exam Musculoskeletal:  grossly normal tone BUE/BLE, good ROM, no bony abnormality Psychiatric:  grossly normal mood and affect, speech fluent and appropriate, AOx3 Neurologic:  CN 2-12 grossly intact, moves all extremities in coordinated fashion    Radiological Exams on Admission: Independently reviewed - see discussion in A/P where applicable  CT Head Wo Contrast  Result  Date: 03/18/2021 CLINICAL DATA:  Nausea and vomiting EXAM: CT HEAD WITHOUT CONTRAST TECHNIQUE: Contiguous axial images were obtained from the base of the skull through the vertex without intravenous contrast. COMPARISON:  Head CT dated February 23, 2021 FINDINGS: Brain: Right subdural hemorrhage of the right cerebral convexity and right temporal lobe intraparenchymal hemorrhage are decreased in size and density when compared with prior exam. Approximately 5 mm of right to left midline shift, unchanged compared to prior. Basal cisterns are patent. Vascular: No hyperdense vessel or unexpected calcification. Skull: Normal. Negative for fracture or focal lesion. Sinuses/Orbits: No acute finding. Other: None. IMPRESSION: 1. Expected evolution of right subdural hemorrhage and right temporal lobe intraparenchymal hemorrhage. Similar mass effect with approximately 5 mm of leftward midline shift. 2. No new acute abnormality. Electronically Signed   By: Yetta Glassman M.D.   On: 03/18/2021 09:38   CT ABDOMEN PELVIS W CONTRAST  Result Date: 03/18/2021 CLINICAL DATA:  Abdominal pain, hernia suspected EXAM: CT ABDOMEN AND PELVIS WITH CONTRAST TECHNIQUE: Multidetector CT imaging of the abdomen and pelvis was performed using the standard protocol following bolus administration of intravenous contrast. CONTRAST:  150mL OMNIPAQUE IOHEXOL 300 MG/ML  SOLN COMPARISON:  CT abdomen and pelvis 10/30/2017 FINDINGS: Lower chest: No acute process identified. Hepatobiliary: Liver is normal in size and contour with no suspicious mass identified. Small hypodensity adjacent to the falciform ligament likely represents focal fatty infiltration. Gallbladder is mildly distended and contains 1.4 cm calculus in the neck. No gallbladder wall thickening or pericholecystic edema. No biliary ductal dilatation identified. Pancreas: Atrophic with no suspicious mass or ductal dilatation visualized. Spleen: Normal in size without focal abnormality.  Adrenals/Urinary Tract: 1.2 cm left adrenal gland nodule, stable and likely adenoma. Also stable appearing right adrenal gland nodularity measuring 1.2 cm. Two small nonobstructing calculi identified in the left kidney measuring up to 3 mm in the upper pole. Small hypodense cyst in the lower right kidney.  No enhancing renal mass or hydronephrosis identified bilaterally. Urinary bladder appears grossly normal. Stomach/Bowel: Numerous loops of abnormally distended small bowel with air-fluid levels measuring up to 3.7 cm in diameter. There is a transition point identified within a left inguinal hernia which contains fat and a loop of small bowel. No pneumatosis or free air identified. Mild colonic diverticulosis. Appendix is normal. Vascular/Lymphatic: Severe atherosclerotic disease. Aorto bi-iliac stent graft which is patent, for the fusiform infrarenal abdominal aortic aneurysm seen on previous studies, which measures up to 3.8 cm in diameter. No bulky lymphadenopathy identified. Reproductive: Brachytherapy seeds in the prostate. Other: Umbilical hernia containing fat.  No ascites. Musculoskeletal: Degenerative and postsurgical changes of the lumbar spine. IMPRESSION: 1. High-grade small bowel obstruction with transition point in the left inguinal hernia. Surgical consultation recommended. 2. Cholelithiasis. 3. Left nephrolithiasis. Bilateral adrenal gland nodules, likely adenomas. 4. Multiple additional chronic findings as described. Findings discussed with Dr. Maryan Rued over the telephone at 1:09 p.m. on 03/18/2021 with read back. Electronically Signed   By: Ofilia Neas M.D.   On: 03/18/2021 13:19   DG Chest Port 1 View  Result Date: 03/18/2021 CLINICAL DATA:  Shortness of breath, history of COPD and smoking. EXAM: PORTABLE CHEST 1 VIEW COMPARISON:  Chest radiograph 02/21/2021 FINDINGS: Interval removal of endotracheal tube and enteric tube. Stable cardiomediastinal silhouette with tortuous thoracic aorta.  Aortic calcifications. Lungs are clear. No acute osseous abnormality. IMPRESSION: No acute cardiopulmonary abnormality. Aortic Atherosclerosis (ICD10-I70.0). Electronically Signed   By: Ileana Roup M.D.   On: 03/18/2021 09:08    EKG: Independently reviewed.  NSR with rate 92; LVH; nonspecific ST changes with NSCSLT   Labs on Admission: I have personally reviewed the available labs and imaging studies at the time of the admission.  Pertinent labs:   Glucose 150 Bili 1.3 HS troponin 16, 14 Lactate 1.6 WBC 13.7 COVID/flu negative   Assessment/Plan Principal Problem:   Incarcerated inguinal hernia Active Problems:   Hyperlipidemia   Essential hypertension   Tobacco abuse   AAA (abdominal aortic aneurysm) without rupture   Centrilobular emphysema (HCC)   Chronic combined systolic and diastolic CHF (congestive heart failure) (HCC)   Intraparenchymal hemorrhage of brain (HCC)   Incarcerated hernia -Patient with recent unrelated hospitalization presenting with acute onset of n/v, L inguinal bulge, and CT findings c/w high-grade SBO initiating at site of hernia -Will admit to surgical telemetry -Gen Surg consulted by ER and is taking him to the OR soon for repair -NPO until cleared by surgery -IVF hydration -Pain control with oxy, morphine or as per surgery -Other management per surgery  Recent SDH -CT today with expected evolution of R SDH and R temporal lobe intraparenchymal hemorrhage -Ongoing but stable 5 mm leftward midline shift -Appears to be stable neurologically at this time -Would avoid anticoagulation if at all possible -Continue valproic acid  Chronic combined CHF -06/2020 echo with EF 30-35% and grade 1 diastolic dysfunction -Appears to be compensated at this time - but will need to be monitored post-operatively -Continue Entresto -Hold spironolactone for now  PVD -AAA, 3.8 cm -s/p aortobi-iliac stent graft, patent -Severe atherosclerotic disease on  CT  HTN -Continue Coreg -Continue Entresto -Hold spironolactone  HLD -Continue Lipitor  Prostate CA -s/p seed implants, seen on CT today  COPD -Continue Symbicort (Dulera per formulary) and prn Albuterol -Continue Nicotine patch  Abnormal CT -CT A/P showed a number of other findings that will/may need f/u after resolution of acute issues including:  -Cholelithiasis with 1.4  cm calculus in gallbladder neck, normal LFTs (other than bili 1.3)  -Adrenal nodules, likely adenomas  -Nephrolithiasis/renal cyst without apparent symptoms      Note: This patient has been tested and is negative for the novel coronavirus COVID-19. The patient has been fully vaccinated against COVID-19.   Level of care: Telemetry Surgical DVT prophylaxis: SCDs Code Status: DNR- confirmed with patient Family Communication: None present; he declined to have me speak with family since the surgeon will speak with them post-operatively. Disposition Plan:  The patient is from: home  Anticipated d/c is to: be determined  Anticipated d/c date will depend on clinical response to treatment, likely 2-3 days  Patient is currently: acutely ill Consults called: Surgery  Admission status:  Admit - It is my clinical opinion that admission to INPATIENT is reasonable and necessary because of the expectation that this patient will require hospital care that crosses at least 2 midnights to treat this condition based on the medical complexity of the problems presented.  Given the aforementioned information, the predictability of an adverse outcome is felt to be significant.    Karmen Bongo MD [facility]   How to contact the [facility] Attending or Consulting provider [place] or covering provider during after hours [place], for this patient?  Check the care team in [facility] and look for a) attending/consulting [facility] provider listed and b) the [facility] team listed Log into [email or web address] and use [place]'s universal password to  access. If you do not have the password, please contact the hospital operator. Locate the [facility] provider you are looking for under [facility] and page to a number that you can be directly reached. If you still have difficulty reaching the provider, please page the [facility] (Director on Call) for the Hospitalists listed on amion for assistance.   03/18/2021, 3:25 PM

## 2021-03-19 LAB — CBC
HCT: 44.2 % (ref 39.0–52.0)
Hemoglobin: 14.7 g/dL (ref 13.0–17.0)
MCH: 32.4 pg (ref 26.0–34.0)
MCHC: 33.3 g/dL (ref 30.0–36.0)
MCV: 97.4 fL (ref 80.0–100.0)
Platelets: 152 10*3/uL (ref 150–400)
RBC: 4.54 MIL/uL (ref 4.22–5.81)
RDW: 12.8 % (ref 11.5–15.5)
WBC: 14 10*3/uL — ABNORMAL HIGH (ref 4.0–10.5)
nRBC: 0 % (ref 0.0–0.2)

## 2021-03-19 LAB — BASIC METABOLIC PANEL
Anion gap: 9 (ref 5–15)
BUN: 25 mg/dL — ABNORMAL HIGH (ref 8–23)
CO2: 27 mmol/L (ref 22–32)
Calcium: 8.3 mg/dL — ABNORMAL LOW (ref 8.9–10.3)
Chloride: 102 mmol/L (ref 98–111)
Creatinine, Ser: 1.25 mg/dL — ABNORMAL HIGH (ref 0.61–1.24)
GFR, Estimated: 59 mL/min — ABNORMAL LOW (ref >60)
Glucose, Bld: 136 mg/dL — ABNORMAL HIGH (ref 70–99)
Potassium: 4.7 mmol/L (ref 3.5–5.1)
Sodium: 138 mmol/L (ref 135–145)

## 2021-03-19 NOTE — Progress Notes (Signed)
Patient was admitted from the PACU. Patient has belongings, call bell and has been oriented to the unit and room. Patient denies having any questions.

## 2021-03-19 NOTE — Progress Notes (Signed)
PROGRESS NOTE    [name]  XVQ:[id number] DOB: 03/14/1946 DOA: 03/18/2021 PCP: Rusty Aus, MD  Consultants:  Rockey Situ - cardiology; Lucky Cowboy - vascular surgery; Prescott Parma - orthopedics Patient coming from:  Home - lives with wife; NOK: Wife, [name], [place]  Brief Narrative: [name] is a 75 y.o. male with medical history significant of AAA; COPD; CAD; chronic systolic CHF; PAD; HTN; HLD; and prostate CA presenting with n/v.   He was previously admitted to the neurosurgery service from 11/7-12 with SDH and was discharged to [facility] and remained there through 11/30.  He reports that he progressed well while there and had no apparent issues.  However, he arrived home on 11/30 and started vomiting shortly thereafter with any attempt at food/drink intake.   He has noticed a bulge in his left groin.  He was found to have an incarcerated hernia and is going to the OR now.   Of note, his wife has had a complicated hospitalization following hip fracture.  He reports that she was discharged today, after 5 weeks of hospitalization.     Incarcerated inguinal hernia, started vomiting yesterday.  Fall with LOC, intracranial hemorrhage, d/c from CIR on 11/30. Procedure: open left recurrent, strangulated inguinal hernia repair with phasix ST absorbable mesh, exploratory laparotomy, small bowel resection with primary anastomosis, primary repair of umbilical hernia   Pre-op diagnosis: incarcerated left inguinal hernia, reducible umbilical hernia Post-op diagnosis: strangulated left inguinal hernia, incarcerated umbilical hernia, 4cm  Assessment & Plan:  Status post inguinal hernia repair left small bowel resection, insertion of mesh left and umbilical hernia repair Day 1 Patient is doing well no complaints Bowel sounds present flatus not yet Seen by surgery recommended advance diet Early ambulation  Status post SCD Recent SDH -CT today with expected evolution of R SDH and R temporal  lobe intraparenchymal hemorrhage -Ongoing but stable 5 mm leftward midline shift -Appears to be stable neurologically at this time -Would avoid anticoagulation if at all possible -Continue valproic acid   Chronic combined CHF -06/2020 echo with EF 30-35% and grade 1 diastolic dysfunction -Appears to be compensated at this time - but will need to be monitored post-operatively -Continue Entresto -Hold spironolactone for now   PVD -AAA, 3.8 cm -s/p aortobi-iliac stent graft, patent -Severe atherosclerotic disease on CT   HTN -Continue Coreg -Continue Entresto -Hold spironolactone   HLD -Continue Lipitor   Prostate CA -s/p seed implants, seen on CT today   COPD -Continue Symbicort (Dulera per formulary) and prn Albuterol -Continue Nicotine patch     Principal Problem:   Incarcerated inguinal hernia Active Problems:   Hyperlipidemia   Essential hypertension   Tobacco abuse   AAA (abdominal aortic aneurysm) without rupture   Centrilobular emphysema (HCC)   Chronic combined systolic and diastolic CHF (congestive heart failure) (HCC)   Intraparenchymal hemorrhage of brain (HCC)      DVT prophylaxis: SCD Code Status: DNR) Family Communication: Nobody in the room Disposition Plan:  Advance diet  discharge per surgery recommendation Consultants:  Reather Laurence  Procedures:   Procedure: open left recurrent, strangulated inguinal hernia repair with phasix ST absorbable mesh, exploratory laparotomy, small bowel resection with primary anastomosis, primary repair of umbilical hernia   Pre-op diagnosis: incarcerated left inguinal hernia, reducible umbilical hernia Post-op diagnosis: strangulated left inguinal hernia, incarcerated umbilical hernia, 4cm  Antimicrobials:      Subjective: Doing well, mild abdominal pain, involves movement present, no flatus yet   Objective: Vitals:  03/18/21 2057 03/19/21 0224 03/19/21 0416 03/19/21 0837  BP:  134/70 (!) 151/64  115/66  Pulse:  74 79 78  Resp:  16 19 18   Temp:  98.1 F (36.7 C) 98.2 F (36.8 C) (!) 97.4 F (36.3 C)  TempSrc:  Oral Oral Oral  SpO2:  94% 94% 93%  Weight: 73 kg     Height: 6' (1.829 m)       Intake/Output Summary (Last 24 hours) at 03/19/2021 1151 Last data filed at 03/19/2021 0900 Gross per 24 hour  Intake 2765.98 ml  Output 550 ml  Net 2215.98 ml   Filed Weights   03/18/21 2057  Weight: 73 kg    Examination:  General exam: Appears calm and comfortable  Respiratory system: Clear to auscultation. Respiratory effort normal. Cardiovascular system: S1 & S2 heard, RRR. No JVD, murmurs, rubs, gallops or clicks. No pedal edema. Gastrointestinal system: Abdomen is nondistended, soft and nontender. No organomegaly or masses felt. Normal bowel sounds heard. Central nervous system: Alert and oriented. No focal neurological deficits. Extremities: Symmetric 5 x 5 power. Skin: No rashes, lesions or ulcers Psychiatry: Judgement and insight appear normal. Mood & affect appropriate.     Data Reviewed: I have personally reviewed following labs and imaging studies  CBC: Recent Labs  Lab 03/14/21 0711 03/18/21 0840 03/19/21 0256  WBC 7.6 13.7* 14.0*  NEUTROABS 5.5 12.3*  --   HGB 15.3 16.9 14.7  HCT 45.4 50.5 44.2  MCV 93.8 95.5 97.4  PLT 204 227 537   Basic Metabolic Panel: Recent Labs  Lab 03/14/21 0711 03/18/21 0840 03/19/21 0256  NA 138 140 138  K 3.8 4.6 4.7  CL 104 104 102  CO2 26 27 27   GLUCOSE 109* 150* 136*  BUN 17 23 25*  CREATININE 0.97 1.09 1.25*  CALCIUM 9.0 9.5 8.3*   GFR: Estimated Creatinine Clearance: 51.1 mL/min (A) (by C-G formula based on SCr of 1.25 mg/dL (H)). Liver Function Tests: Recent Labs  Lab 03/18/21 0840  AST 18  ALT 15  ALKPHOS 78  BILITOT 1.3*  PROT 6.4*  ALBUMIN 3.5   Recent Labs  Lab 03/18/21 0840  LIPASE 22   No results for input(s): AMMONIA in the last 168 hours. Coagulation Profile: No results for input(s):  INR, PROTIME in the last 168 hours. Cardiac Enzymes: No results for input(s): CKTOTAL, CKMB, CKMBINDEX, TROPONINI in the last 168 hours. BNP (last 3 results) No results for input(s): PROBNP in the last 8760 hours. HbA1C: No results for input(s): HGBA1C in the last 72 hours. CBG: Recent Labs  Lab 03/13/21 2115 03/18/21 0841  GLUCAP 103* 138*   Lipid Profile: No results for input(s): CHOL, HDL, LDLCALC, TRIG, CHOLHDL, LDLDIRECT in the last 72 hours. Thyroid Function Tests: No results for input(s): TSH, T4TOTAL, FREET4, T3FREE, THYROIDAB in the last 72 hours. Anemia Panel: No results for input(s): VITAMINB12, FOLATE, FERRITIN, TIBC, IRON, RETICCTPCT in the last 72 hours. Sepsis Labs: Recent Labs  Lab 03/18/21 0840  LATICACIDVEN 1.6    Recent Results (from the past 240 hour(s))  Resp Panel by RT-PCR (Flu A&B, Covid) Nasopharyngeal Swab     Status: None   Collection Time: 03/18/21  8:45 AM   Specimen: Nasopharyngeal Swab; Nasopharyngeal(NP) swabs in vial transport medium  Result Value Ref Range Status   SARS Coronavirus 2 by RT PCR NEGATIVE NEGATIVE Final    Comment: (NOTE) SARS-CoV-2 target nucleic acids are NOT DETECTED.  The SARS-CoV-2 RNA is generally detectable in upper respiratory  specimens during the acute phase of infection. The lowest concentration of SARS-CoV-2 viral copies this assay can detect is 138 copies/mL. A negative result does not preclude SARS-Cov-2 infection and should not be used as the sole basis for treatment or other patient management decisions. A negative result may occur with  improper specimen collection/handling, submission of specimen other than nasopharyngeal swab, presence of viral mutation(s) within the areas targeted by this assay, and inadequate number of viral copies(<138 copies/mL). A negative result must be combined with clinical observations, patient history, and epidemiological information. The expected result is Negative.  Fact  Sheet for Patients:  EntrepreneurPulse.com.au  Fact Sheet for Healthcare Providers:  IncredibleEmployment.be  This test is no t yet approved or cleared by the [place] FDA and  has been authorized for detection and/or diagnosis of SARS-CoV-2 by FDA under an Emergency Use Authorization (EUA). This EUA will remain  in effect (meaning this test can be used) for the duration of the COVID-19 declaration under Section 564(b)(1) of the Act, 21 U.S.C.section 360bbb-3(b)(1), unless the authorization is terminated  or revoked sooner.       Influenza A by PCR NEGATIVE NEGATIVE Final   Influenza B by PCR NEGATIVE NEGATIVE Final    Comment: (NOTE) The Xpert Xpress SARS-CoV-2/FLU/RSV plus assay is intended as an aid in the diagnosis of influenza from Nasopharyngeal swab specimens and should not be used as a sole basis for treatment. Nasal washings and aspirates are unacceptable for Xpert Xpress SARS-CoV-2/FLU/RSV testing.  Fact Sheet for Patients: EntrepreneurPulse.com.au  Fact Sheet for Healthcare Providers: IncredibleEmployment.be  This test is not yet approved or cleared by the [place] FDA and has been authorized for detection and/or diagnosis of SARS-CoV-2 by FDA under an Emergency Use Authorization (EUA). This EUA will remain in effect (meaning this test can be used) for the duration of the COVID-19 declaration under Section 564(b)(1) of the Act, 21 U.S.C. section 360bbb-3(b)(1), unless the authorization is terminated or revoked.  Performed at [facility], [place] [address] [place], [place] [address]          Radiology Studies: CT Head Wo Contrast  Result Date: 03/18/2021 CLINICAL DATA:  Nausea and vomiting EXAM: CT HEAD WITHOUT CONTRAST TECHNIQUE: Contiguous axial images were obtained from the base of the skull through the vertex without intravenous contrast. COMPARISON:  Head CT dated  February 23, 2021 FINDINGS: Brain: Right subdural hemorrhage of the right cerebral convexity and right temporal lobe intraparenchymal hemorrhage are decreased in size and density when compared with prior exam. Approximately 5 mm of right to left midline shift, unchanged compared to prior. Basal cisterns are patent. Vascular: No hyperdense vessel or unexpected calcification. Skull: Normal. Negative for fracture or focal lesion. Sinuses/Orbits: No acute finding. Other: None. IMPRESSION: 1. Expected evolution of right subdural hemorrhage and right temporal lobe intraparenchymal hemorrhage. Similar mass effect with approximately 5 mm of leftward midline shift. 2. No new acute abnormality. Electronically Signed   By: Yetta Glassman M.D.   On: 03/18/2021 09:38   CT ABDOMEN PELVIS W CONTRAST  Result Date: 03/18/2021 CLINICAL DATA:  Abdominal pain, hernia suspected EXAM: CT ABDOMEN AND PELVIS WITH CONTRAST TECHNIQUE: Multidetector CT imaging of the abdomen and pelvis was performed using the standard protocol following bolus administration of intravenous contrast. CONTRAST:  143mL OMNIPAQUE IOHEXOL 300 MG/ML  SOLN COMPARISON:  CT abdomen and pelvis 10/30/2017 FINDINGS: Lower chest: No acute process identified. Hepatobiliary: Liver is normal in size and contour with no suspicious mass identified. Small  hypodensity adjacent to the falciform ligament likely represents focal fatty infiltration. Gallbladder is mildly distended and contains 1.4 cm calculus in the neck. No gallbladder wall thickening or pericholecystic edema. No biliary ductal dilatation identified. Pancreas: Atrophic with no suspicious mass or ductal dilatation visualized. Spleen: Normal in size without focal abnormality. Adrenals/Urinary Tract: 1.2 cm left adrenal gland nodule, stable and likely adenoma. Also stable appearing right adrenal gland nodularity measuring 1.2 cm. Two small nonobstructing calculi identified in the left kidney measuring up to 3 mm  in the upper pole. Small hypodense cyst in the lower right kidney. No enhancing renal mass or hydronephrosis identified bilaterally. Urinary bladder appears grossly normal. Stomach/Bowel: Numerous loops of abnormally distended small bowel with air-fluid levels measuring up to 3.7 cm in diameter. There is a transition point identified within a left inguinal hernia which contains fat and a loop of small bowel. No pneumatosis or free air identified. Mild colonic diverticulosis. Appendix is normal. Vascular/Lymphatic: Severe atherosclerotic disease. Aorto bi-iliac stent graft which is patent, for the fusiform infrarenal abdominal aortic aneurysm seen on previous studies, which measures up to 3.8 cm in diameter. No bulky lymphadenopathy identified. Reproductive: Brachytherapy seeds in the prostate. Other: Umbilical hernia containing fat.  No ascites. Musculoskeletal: Degenerative and postsurgical changes of the lumbar spine. IMPRESSION: 1. High-grade small bowel obstruction with transition point in the left inguinal hernia. Surgical consultation recommended. 2. Cholelithiasis. 3. Left nephrolithiasis. Bilateral adrenal gland nodules, likely adenomas. 4. Multiple additional chronic findings as described. Findings discussed with Dr. Maryan Rued over the telephone at 1:09 p.m. on 03/18/2021 with read back. Electronically Signed   By: Ofilia Neas M.D.   On: 03/18/2021 13:19   DG Chest Port 1 View  Result Date: 03/18/2021 CLINICAL DATA:  Shortness of breath, history of COPD and smoking. EXAM: PORTABLE CHEST 1 VIEW COMPARISON:  Chest radiograph 02/21/2021 FINDINGS: Interval removal of endotracheal tube and enteric tube. Stable cardiomediastinal silhouette with tortuous thoracic aorta. Aortic calcifications. Lungs are clear. No acute osseous abnormality. IMPRESSION: No acute cardiopulmonary abnormality. Aortic Atherosclerosis (ICD10-I70.0). Electronically Signed   By: Ileana Roup M.D.   On: 03/18/2021 09:08         Scheduled Meds:  atorvastatin  80 mg Oral Daily   carvedilol  3.125 mg Oral BID WC   cyclobenzaprine  5 mg Oral QHS   mometasone-formoterol  2 puff Inhalation BID   nicotine  21 mg Transdermal Daily   pantoprazole  40 mg Oral Daily   sacubitril-valsartan  1 tablet Oral BID   valproic acid  250 mg Oral BID   Continuous Infusions:  lactated ringers 100 mL/hr at 03/19/21 0651     LOS: 1 day    Time spent: 35 minutes    Arianah Torgeson G Kana Reimann, MD [facility]   If 7PM-7AM, please contact night-coverage [email or web address] Password [email or web address] 03/19/2021, 11:51 AM

## 2021-03-19 NOTE — Plan of Care (Signed)

## 2021-03-19 NOTE — Progress Notes (Signed)
1 Day Post-Op   Subjective/Chief Complaint: Patient complains of right shoulder pain and mild abdominal pain.  No nausea or vomiting.   Objective: Vital signs in last 24 hours: Temp:  [97.1 F (36.2 C)-98.6 F (37 C)] 97.4 F (36.3 C) (12/03 0837) Pulse Rate:  [72-91] 78 (12/03 0837) Resp:  [15-25] 18 (12/03 0837) BP: (104-163)/(56-106) 115/66 (12/03 0837) SpO2:  [88 %-100 %] 93 % (12/03 0837) Weight:  [73 kg] 73 kg (12/02 2057)    Intake/Output from previous day: 12/02 0701 - 12/03 0700 In: 2526 [P.O.:240; I.V.:2286] Out: 550 [Urine:500; Blood:50] Intake/Output this shift: No intake/output data recorded.  General appearance: alert and cooperative Resp: clear to auscultation bilaterally Cardio: regular rate and rhythm, S1, S2 normal, no murmur, click, rub or gallop Incision/Wound: Dressings in place.  Dry.  Nondistended.  Midline incision has a honeycomb as well as left groin incision.  Soft appropriately tender  Lab Results:  Recent Labs    03/18/21 0840 03/19/21 0256  WBC 13.7* 14.0*  HGB 16.9 14.7  HCT 50.5 44.2  PLT 227 152   BMET Recent Labs    03/18/21 0840 03/19/21 0256  NA 140 138  K 4.6 4.7  CL 104 102  CO2 27 27  GLUCOSE 150* 136*  BUN 23 25*  CREATININE 1.09 1.25*  CALCIUM 9.5 8.3*   PT/INR No results for input(s): LABPROT, INR in the last 72 hours. ABG No results for input(s): PHART, HCO3 in the last 72 hours.  Invalid input(s): PCO2, PO2  Studies/Results: CT Head Wo Contrast  Result Date: 03/18/2021 CLINICAL DATA:  Nausea and vomiting EXAM: CT HEAD WITHOUT CONTRAST TECHNIQUE: Contiguous axial images were obtained from the base of the skull through the vertex without intravenous contrast. COMPARISON:  Head CT dated February 23, 2021 FINDINGS: Brain: Right subdural hemorrhage of the right cerebral convexity and right temporal lobe intraparenchymal hemorrhage are decreased in size and density when compared with prior exam. Approximately 5 mm  of right to left midline shift, unchanged compared to prior. Basal cisterns are patent. Vascular: No hyperdense vessel or unexpected calcification. Skull: Normal. Negative for fracture or focal lesion. Sinuses/Orbits: No acute finding. Other: None. IMPRESSION: 1. Expected evolution of right subdural hemorrhage and right temporal lobe intraparenchymal hemorrhage. Similar mass effect with approximately 5 mm of leftward midline shift. 2. No new acute abnormality. Electronically Signed   By: Yetta Glassman M.D.   On: 03/18/2021 09:38   CT ABDOMEN PELVIS W CONTRAST  Result Date: 03/18/2021 CLINICAL DATA:  Abdominal pain, hernia suspected EXAM: CT ABDOMEN AND PELVIS WITH CONTRAST TECHNIQUE: Multidetector CT imaging of the abdomen and pelvis was performed using the standard protocol following bolus administration of intravenous contrast. CONTRAST:  165mL OMNIPAQUE IOHEXOL 300 MG/ML  SOLN COMPARISON:  CT abdomen and pelvis 10/30/2017 FINDINGS: Lower chest: No acute process identified. Hepatobiliary: Liver is normal in size and contour with no suspicious mass identified. Small hypodensity adjacent to the falciform ligament likely represents focal fatty infiltration. Gallbladder is mildly distended and contains 1.4 cm calculus in the neck. No gallbladder wall thickening or pericholecystic edema. No biliary ductal dilatation identified. Pancreas: Atrophic with no suspicious mass or ductal dilatation visualized. Spleen: Normal in size without focal abnormality. Adrenals/Urinary Tract: 1.2 cm left adrenal gland nodule, stable and likely adenoma. Also stable appearing right adrenal gland nodularity measuring 1.2 cm. Two small nonobstructing calculi identified in the left kidney measuring up to 3 mm in the upper pole. Small hypodense cyst in the lower right  kidney. No enhancing renal mass or hydronephrosis identified bilaterally. Urinary bladder appears grossly normal. Stomach/Bowel: Numerous loops of abnormally distended  small bowel with air-fluid levels measuring up to 3.7 cm in diameter. There is a transition point identified within a left inguinal hernia which contains fat and a loop of small bowel. No pneumatosis or free air identified. Mild colonic diverticulosis. Appendix is normal. Vascular/Lymphatic: Severe atherosclerotic disease. Aorto bi-iliac stent graft which is patent, for the fusiform infrarenal abdominal aortic aneurysm seen on previous studies, which measures up to 3.8 cm in diameter. No bulky lymphadenopathy identified. Reproductive: Brachytherapy seeds in the prostate. Other: Umbilical hernia containing fat.  No ascites. Musculoskeletal: Degenerative and postsurgical changes of the lumbar spine. IMPRESSION: 1. High-grade small bowel obstruction with transition point in the left inguinal hernia. Surgical consultation recommended. 2. Cholelithiasis. 3. Left nephrolithiasis. Bilateral adrenal gland nodules, likely adenomas. 4. Multiple additional chronic findings as described. Findings discussed with Dr. Maryan Rued over the telephone at 1:09 p.m. on 03/18/2021 with read back. Electronically Signed   By: Ofilia Neas M.D.   On: 03/18/2021 13:19   DG Chest Port 1 View  Result Date: 03/18/2021 CLINICAL DATA:  Shortness of breath, history of COPD and smoking. EXAM: PORTABLE CHEST 1 VIEW COMPARISON:  Chest radiograph 02/21/2021 FINDINGS: Interval removal of endotracheal tube and enteric tube. Stable cardiomediastinal silhouette with tortuous thoracic aorta. Aortic calcifications. Lungs are clear. No acute osseous abnormality. IMPRESSION: No acute cardiopulmonary abnormality. Aortic Atherosclerosis (ICD10-I70.0). Electronically Signed   By: Ileana Roup M.D.   On: 03/18/2021 09:08    Anti-infectives: Anti-infectives (From admission, onward)    Start     Dose/Rate Route Frequency Ordered Stop   03/19/21 0600  cefoTEtan (CEFOTAN) 1 g in sodium chloride 0.9 % 100 mL IVPB        1 g 200 mL/hr over 30 Minutes  Intravenous On call to O.R. 03/18/21 1412 03/19/21 0706   03/18/21 1516  sodium chloride 0.9 % with cefoTEtan (CEFOTAN) ADS Med       Note to Pharmacy: Renda Rolls L: cabinet override      03/18/21 1516 03/19/21 0329       Assessment/Plan: s/p Procedure(s): INGUINAL HERNIA REPAIR (Left) SMALL BOWEL RESECTION (N/A) INSERTION OF MESH (Left) UMBILICAL HERNIA REPAIR (N/A) Advance diet  Out of bed Okay to ambulate   LOS: 1 day    Marcello Moores A Wyley Hack 03/19/2021

## 2021-03-20 LAB — CBC
HCT: 38.1 % — ABNORMAL LOW (ref 39.0–52.0)
Hemoglobin: 12.4 g/dL — ABNORMAL LOW (ref 13.0–17.0)
MCH: 31.6 pg (ref 26.0–34.0)
MCHC: 32.5 g/dL (ref 30.0–36.0)
MCV: 97.2 fL (ref 80.0–100.0)
Platelets: 121 10*3/uL — ABNORMAL LOW (ref 150–400)
RBC: 3.92 MIL/uL — ABNORMAL LOW (ref 4.22–5.81)
RDW: 12.9 % (ref 11.5–15.5)
WBC: 10.8 10*3/uL — ABNORMAL HIGH (ref 4.0–10.5)
nRBC: 0 % (ref 0.0–0.2)

## 2021-03-20 LAB — BASIC METABOLIC PANEL
Anion gap: 9 (ref 5–15)
BUN: 19 mg/dL (ref 8–23)
CO2: 27 mmol/L (ref 22–32)
Calcium: 8.1 mg/dL — ABNORMAL LOW (ref 8.9–10.3)
Chloride: 99 mmol/L (ref 98–111)
Creatinine, Ser: 0.91 mg/dL (ref 0.61–1.24)
GFR, Estimated: >60 mL/min (ref >60)
Glucose, Bld: 95 mg/dL (ref 70–99)
Potassium: 4.1 mmol/L (ref 3.5–5.1)
Sodium: 135 mmol/L (ref 135–145)

## 2021-03-20 MED ORDER — ENSURE ENLIVE PO LIQD
237.0000 mL | Freq: Three times a day (TID) | ORAL | Status: DC
  Administered 2021-03-20 – 2021-03-28 (×20): 237 mL via ORAL

## 2021-03-20 MED ORDER — SODIUM CHLORIDE 0.9 % IV SOLN: INTRAVENOUS | Status: AC

## 2021-03-20 MED ORDER — ADULT MULTIVITAMIN W/MINERALS CH
1.0000 | ORAL_TABLET | Freq: Every day | ORAL | Status: DC
  Administered 2021-03-21 – 2021-03-28 (×8): 1 via ORAL
  Filled 2021-03-20 (×8): qty 1

## 2021-03-20 NOTE — Progress Notes (Signed)
Initial Nutrition Assessment  DOCUMENTATION CODES:   Not applicable  INTERVENTION:   Ensure Enlive po TID, each supplement provides 350 kcal and 20 grams of protein  MVI po daily   Pt at high refeed risk; recommend monitor potassium, magnesium and phosphorus labs daily until stable  NUTRITION DIAGNOSIS:   Increased nutrient needs related to post-op healing as evidenced by estimated needs.  GOAL:   Patient will meet greater than or equal to 90% of their needs  MONITOR:   PO intake, Supplement acceptance, Labs, Weight trends, Skin, I & O's  REASON FOR ASSESSMENT:   Malnutrition Screening Tool    ASSESSMENT:   75 y.o. male with a hx of CHF (EF 40-45% on Echo 02/21/2021), HTN, HLD, CVA, GERD, COPD, GERD, PVD, prostate cancer (s/p of XTR seeding), CAD (s/p cirumflex stent 2002, LAD stent 2012), AAA, MI, Barrett's esophagus and tobacco abuse who is admitted with SBO seondary to stangulated inguinal hernia now s/p ex lap with small bowel resection, umbilical hernia repair and inguinal hernia repair with mesh 12/2.  RD working remotely.  Unable to reach pt by phone. Per chart review, pt with fairly good appetite and oral intake in rehab; pt eating 25-100% of meals and drinking Ensure supplements. Per chart, pt is down 10lbs(6%) over the past month; this is significant. RD suspects pt with poor oral intake for several days pta r/t SBO. Pt advanced to a soft diet today. RD will add supplements and MVI to help pt meet his estimated needs and to support post-op healing. Pt is likely at refeed risk. Per chart, pt with abdominal soreness today. No BM noted today. RD will obtain nutrition related history and exam at follow up.   Medications reviewed and include: nicotine, protonix  Labs reviewed: K 4.5 wnl, BUN 25(H0, creat 1.25(H) Wbc- 14.0(H) Cbgs- 138  NUTRITION - FOCUSED PHYSICAL EXAM: Unable to perform at this time  Diet Order:   Diet Order             DIET SOFT Room service  appropriate? Yes; Fluid consistency: Thin  Diet effective now                  EDUCATION NEEDS:   Not appropriate for education at this time  Skin:  Skin Assessment: Reviewed RN Assessment (Stage II coccyx (noted 11/12), incision abdomen)  Last BM:  12/3  Height:   Ht Readings from Last 1 Encounters:  03/18/21 6' (1.829 m)    Weight:   Wt Readings from Last 1 Encounters:  03/18/21 73 kg    Ideal Body Weight:  80.9 kg  BMI:  Body mass index is 21.83 kg/m.  Estimated Nutritional Needs:   Kcal:  2000-2300kcal/day  Protein:  100-115g/day  Fluid:  1.8-2.1L/day  Koleen Distance MS, RD, LDN Please refer to [facility] for RD and/or RD on-call/weekend/after hours pager

## 2021-03-20 NOTE — Progress Notes (Signed)
PROGRESS NOTE    WOODFIN KISS  ZOX:[id number] DOB: 11-16-1945 DOA: 03/18/2021 PCP: Rusty Aus, MD    Brief Narrative:  [name] is a 75 y.o. male with medical history significant of AAA; COPD; CAD; chronic systolic CHF; PAD; HTN; HLD; and prostate CA presenting with n/v.   He was previously admitted to the neurosurgery service from 11/7-12 with SDH and was discharged to [facility] and remained there through 11/30.  He reports that he progressed well while there and had no apparent issues.  However, he arrived home on 11/30 and started vomiting shortly thereafter with any attempt at food/drink intake.   He has noticed a bulge in his left groin.  He was found to have an incarcerated hernia and is going to the OR now.   Of note, his wife has had a complicated hospitalization following hip fracture.  He reports that she was discharged today, after 5 weeks of hospitalization.      Incarcerated inguinal hernia, started vomiting yesterday.  Fall with LOC, intracranial hemorrhage, d/c from CIR on 11/30. Procedure: open left recurrent, strangulated inguinal hernia repair with phasix ST absorbable mesh, exploratory laparotomy, small bowel resection with primary anastomosis, primary repair of umbilical hernia   Pre-op diagnosis: incarcerated left inguinal hernia, reducible umbilical hernia Post-op diagnosis: strangulated left inguinal hernia, incarcerated umbilical hernia, 4cm    Assessment & Plan: Status post inguinal hernia repair left small bowel resection, insertion of mesh left and umbilical hernia repair Day 2 Patient is doing well no complaints Bowel sounds present flatus not yet Seen by surgery recommended advance diet Tolerated clear liquids okay Restart his p.o. medication Early ambulation  03/20/2021 No bowel movement no flatus yet No abdominal pain Seen by surgery Diet was advanced Early ambulation Increase BUN and creatinine yesterday Increase  fluids overnight for 12 hours  100 cc normal saline Changed by surgery to 50 cc this morning  Status post SCD Recent SDH -CT today with expected evolution of R SDH and R temporal lobe intraparenchymal hemorrhage -Ongoing but stable 5 mm leftward midline shift -Appears to be stable neurologically at this time -Would avoid anticoagulation if at all possible -Continue valproic acid   Chronic combined CHF -06/2020 echo with EF 30-35% and grade 1 diastolic dysfunction -Appears to be compensated at this time - but will need to be monitored post-operatively -Continue Entresto -Hold spironolactone for now   PVD -AAA, 3.8 cm -s/p aortobi-iliac stent graft, patent -Severe atherosclerotic disease on CT   HTN -Continue Coreg -Continue Entresto -Hold spironolactone   HLD -Continue Lipitor   Prostate CA -s/p seed implants, seen on CT today   COPD -Continue Symbicort (Dulera per formulary) and prn Albuterol -Continue Nicotine patch     Principal Problem:   Incarcerated inguinal hernia Active Problems:   Hyperlipidemia   Essential hypertension   Tobacco abuse   AAA (abdominal aortic aneurysm) without rupture   Centrilobular emphysema (HCC)   Chronic combined systolic and diastolic CHF (congestive heart failure) ([place])   Intraparenchymal hemorrhage of brain ([place])      DVT prophylaxis: (SCD Code Status: DNR Family Communication: No family in the room Disposition Plan: Per surgery 2 or 3 days   Consultants:  Surgery    Subjective: No complaints   Objective: Vitals:   03/19/21 2123 03/20/21 0615 03/20/21 0920 03/20/21 0949  BP:  126/63 122/60   Pulse:  70 68   Resp:  18 18   Temp:  97.7 F (36.5 C) 97.8 F (  36.6 C)   TempSrc:  Oral Oral   SpO2: 90% 93% 94% 94%  Weight:      Height:        Intake/Output Summary (Last 24 hours) at 03/20/2021 1436 Last data filed at 03/20/2021 1249 Gross per 24 hour  Intake 3135.13 ml  Output 0 ml  Net 3135.13 ml   Filed Weights   03/18/21 2057   Weight: 73 kg    Examination:  General exam: Appears calm and comfortable  Respiratory system: Clear to auscultation. Respiratory effort normal. Cardiovascular system: S1 & S2 heard, RRR. No JVD, murmurs, rubs, gallops or clicks. No pedal edema. Gastrointestinal system: Abdomen is nondistended, soft and nontender. No organomegaly or masses felt. Normal bowel sounds heard. Central nervous system: Alert and oriented. No focal neurological deficits. Extremities: Symmetric 5 x 5 power. Skin: No rashes, lesions or ulcers Psychiatry: Judgement and insight appear normal. Mood & affect appropriate.     Data Reviewed: I have personally reviewed following labs and imaging studies  CBC: Recent Labs  Lab 03/14/21 0711 03/18/21 0840 03/19/21 0256  WBC 7.6 13.7* 14.0*  NEUTROABS 5.5 12.3*  --   HGB 15.3 16.9 14.7  HCT 45.4 50.5 44.2  MCV 93.8 95.5 97.4  PLT 204 227 502   Basic Metabolic Panel: Recent Labs  Lab 03/14/21 0711 03/18/21 0840 03/19/21 0256  NA 138 140 138  K 3.8 4.6 4.7  CL 104 104 102  CO2 26 27 27   GLUCOSE 109* 150* 136*  BUN 17 23 25*  CREATININE 0.97 1.09 1.25*  CALCIUM 9.0 9.5 8.3*   GFR: Estimated Creatinine Clearance: 51.1 mL/min (A) (by C-G formula based on SCr of 1.25 mg/dL (H)). Liver Function Tests: Recent Labs  Lab 03/18/21 0840  AST 18  ALT 15  ALKPHOS 78  BILITOT 1.3*  PROT 6.4*  ALBUMIN 3.5   Recent Labs  Lab 03/18/21 0840  LIPASE 22   No results for input(s): AMMONIA in the last 168 hours. Coagulation Profile: No results for input(s): INR, PROTIME in the last 168 hours. Cardiac Enzymes: No results for input(s): CKTOTAL, CKMB, CKMBINDEX, TROPONINI in the last 168 hours. BNP (last 3 results) No results for input(s): PROBNP in the last 8760 hours. HbA1C: No results for input(s): HGBA1C in the last 72 hours. CBG: Recent Labs  Lab 03/13/21 2115 03/18/21 0841  GLUCAP 103* 138*   Lipid Profile: No results for input(s): CHOL,  HDL, LDLCALC, TRIG, CHOLHDL, LDLDIRECT in the last 72 hours. Thyroid Function Tests: No results for input(s): TSH, T4TOTAL, FREET4, T3FREE, THYROIDAB in the last 72 hours. Anemia Panel: No results for input(s): VITAMINB12, FOLATE, FERRITIN, TIBC, IRON, RETICCTPCT in the last 72 hours. Sepsis Labs: Recent Labs  Lab 03/18/21 0840  LATICACIDVEN 1.6    Recent Results (from the past 240 hour(s))  Resp Panel by RT-PCR (Flu A&B, Covid) Nasopharyngeal Swab     Status: None   Collection Time: 03/18/21  8:45 AM   Specimen: Nasopharyngeal Swab; Nasopharyngeal(NP) swabs in vial transport medium  Result Value Ref Range Status   SARS Coronavirus 2 by RT PCR NEGATIVE NEGATIVE Final    Comment: (NOTE) SARS-CoV-2 target nucleic acids are NOT DETECTED.  The SARS-CoV-2 RNA is generally detectable in upper respiratory specimens during the acute phase of infection. The lowest concentration of SARS-CoV-2 viral copies this assay can detect is 138 copies/mL. A negative result does not preclude SARS-Cov-2 infection and should not be used as the sole basis for treatment or other patient  management decisions. A negative result may occur with  improper specimen collection/handling, submission of specimen other than nasopharyngeal swab, presence of viral mutation(s) within the areas targeted by this assay, and inadequate number of viral copies(<138 copies/mL). A negative result must be combined with clinical observations, patient history, and epidemiological information. The expected result is Negative.  Fact Sheet for Patients:  EntrepreneurPulse.com.au  Fact Sheet for Healthcare Providers:  IncredibleEmployment.be  This test is no t yet approved or cleared by the [place] FDA and  has been authorized for detection and/or diagnosis of SARS-CoV-2 by FDA under an Emergency Use Authorization (EUA). This EUA will remain  in effect (meaning this test can be used) for  the duration of the COVID-19 declaration under Section 564(b)(1) of the Act, 21 U.S.C.section 360bbb-3(b)(1), unless the authorization is terminated  or revoked sooner.       Influenza A by PCR NEGATIVE NEGATIVE Final   Influenza B by PCR NEGATIVE NEGATIVE Final    Comment: (NOTE) The Xpert Xpress SARS-CoV-2/FLU/RSV plus assay is intended as an aid in the diagnosis of influenza from Nasopharyngeal swab specimens and should not be used as a sole basis for treatment. Nasal washings and aspirates are unacceptable for Xpert Xpress SARS-CoV-2/FLU/RSV testing.  Fact Sheet for Patients: EntrepreneurPulse.com.au  Fact Sheet for Healthcare Providers: IncredibleEmployment.be  This test is not yet approved or cleared by the [place] FDA and has been authorized for detection and/or diagnosis of SARS-CoV-2 by FDA under an Emergency Use Authorization (EUA). This EUA will remain in effect (meaning this test can be used) for the duration of the COVID-19 declaration under Section 564(b)(1) of the Act, 21 U.S.C. section 360bbb-3(b)(1), unless the authorization is terminated or revoked.  Performed at [facility], [place] [address], [place], [place] [address]          Radiology Studies: No results found.      Scheduled Meds:  atorvastatin  80 mg Oral Daily   carvedilol  3.125 mg Oral BID WC   cyclobenzaprine  5 mg Oral QHS   feeding supplement  237 mL Oral TID BM   mometasone-formoterol  2 puff Inhalation BID   [START ON 03/21/2021] multivitamin with minerals  1 tablet Oral Daily   nicotine  21 mg Transdermal Daily   pantoprazole  40 mg Oral Daily   sacubitril-valsartan  1 tablet Oral BID   valproic acid  250 mg Oral BID   Continuous Infusions:  sodium chloride 50 mL/hr at 03/20/21 1022     LOS: 2 days    Time spent: More than 35 minutes    Assunta Found, MD [facility] Pager [phone]  If 7PM-7AM, please contact  night-coverage [email or web address] Password [email or web address] 03/20/2021, 2:36 PM

## 2021-03-20 NOTE — Progress Notes (Signed)
2 Days Post-Op   Subjective/Chief Complaint: Patient feels sore in his abdomen.  Tolerating clear liquids without nausea or vomiting.  No bowel movement yet.   Objective: Vital signs in last 24 hours: Temp:  [97.4 F (36.3 C)-98.1 F (36.7 C)] 97.7 F (36.5 C) (12/04 0615) Pulse Rate:  [68-78] 70 (12/04 0615) Resp:  [17-18] 18 (12/04 0615) BP: (102-126)/(48-66) 126/63 (12/04 0615) SpO2:  [90 %-94 %] 93 % (12/04 0615) Last BM Date: 03/19/21  Intake/Output from previous day: 12/03 0701 - 12/04 0700 In: 3235.1 [P.O.:560; I.V.:2675.1] Out: 0  Intake/Output this shift: No intake/output data recorded.  General appearance: alert and cooperative Resp: clear to auscultation bilaterally Cardio: regular rate and rhythm Incision/Wound: Lower midline incision clean dry intact with honeycomb dressing in place with minimal drainage.  Left groin incision clean dry intact with honeycomb dressing intact with minimal drainage.  Nondistended  Lab Results:  Recent Labs    03/18/21 0840 03/19/21 0256  WBC 13.7* 14.0*  HGB 16.9 14.7  HCT 50.5 44.2  PLT 227 152   BMET Recent Labs    03/18/21 0840 03/19/21 0256  NA 140 138  K 4.6 4.7  CL 104 102  CO2 27 27  GLUCOSE 150* 136*  BUN 23 25*  CREATININE 1.09 1.25*  CALCIUM 9.5 8.3*   PT/INR No results for input(s): LABPROT, INR in the last 72 hours. ABG No results for input(s): PHART, HCO3 in the last 72 hours.  Invalid input(s): PCO2, PO2  Studies/Results: CT Head Wo Contrast  Result Date: 03/18/2021 CLINICAL DATA:  Nausea and vomiting EXAM: CT HEAD WITHOUT CONTRAST TECHNIQUE: Contiguous axial images were obtained from the base of the skull through the vertex without intravenous contrast. COMPARISON:  Head CT dated February 23, 2021 FINDINGS: Brain: Right subdural hemorrhage of the right cerebral convexity and right temporal lobe intraparenchymal hemorrhage are decreased in size and density when compared with prior exam.  Approximately 5 mm of right to left midline shift, unchanged compared to prior. Basal cisterns are patent. Vascular: No hyperdense vessel or unexpected calcification. Skull: Normal. Negative for fracture or focal lesion. Sinuses/Orbits: No acute finding. Other: None. IMPRESSION: 1. Expected evolution of right subdural hemorrhage and right temporal lobe intraparenchymal hemorrhage. Similar mass effect with approximately 5 mm of leftward midline shift. 2. No new acute abnormality. Electronically Signed   By: Yetta Glassman M.D.   On: 03/18/2021 09:38   CT ABDOMEN PELVIS W CONTRAST  Result Date: 03/18/2021 CLINICAL DATA:  Abdominal pain, hernia suspected EXAM: CT ABDOMEN AND PELVIS WITH CONTRAST TECHNIQUE: Multidetector CT imaging of the abdomen and pelvis was performed using the standard protocol following bolus administration of intravenous contrast. CONTRAST:  185mL OMNIPAQUE IOHEXOL 300 MG/ML  SOLN COMPARISON:  CT abdomen and pelvis 10/30/2017 FINDINGS: Lower chest: No acute process identified. Hepatobiliary: Liver is normal in size and contour with no suspicious mass identified. Small hypodensity adjacent to the falciform ligament likely represents focal fatty infiltration. Gallbladder is mildly distended and contains 1.4 cm calculus in the neck. No gallbladder wall thickening or pericholecystic edema. No biliary ductal dilatation identified. Pancreas: Atrophic with no suspicious mass or ductal dilatation visualized. Spleen: Normal in size without focal abnormality. Adrenals/Urinary Tract: 1.2 cm left adrenal gland nodule, stable and likely adenoma. Also stable appearing right adrenal gland nodularity measuring 1.2 cm. Two small nonobstructing calculi identified in the left kidney measuring up to 3 mm in the upper pole. Small hypodense cyst in the lower right kidney. No enhancing renal mass  or hydronephrosis identified bilaterally. Urinary bladder appears grossly normal. Stomach/Bowel: Numerous loops of  abnormally distended small bowel with air-fluid levels measuring up to 3.7 cm in diameter. There is a transition point identified within a left inguinal hernia which contains fat and a loop of small bowel. No pneumatosis or free air identified. Mild colonic diverticulosis. Appendix is normal. Vascular/Lymphatic: Severe atherosclerotic disease. Aorto bi-iliac stent graft which is patent, for the fusiform infrarenal abdominal aortic aneurysm seen on previous studies, which measures up to 3.8 cm in diameter. No bulky lymphadenopathy identified. Reproductive: Brachytherapy seeds in the prostate. Other: Umbilical hernia containing fat.  No ascites. Musculoskeletal: Degenerative and postsurgical changes of the lumbar spine. IMPRESSION: 1. High-grade small bowel obstruction with transition point in the left inguinal hernia. Surgical consultation recommended. 2. Cholelithiasis. 3. Left nephrolithiasis. Bilateral adrenal gland nodules, likely adenomas. 4. Multiple additional chronic findings as described. Findings discussed with Dr. Maryan Rued over the telephone at 1:09 p.m. on 03/18/2021 with read back. Electronically Signed   By: Ofilia Neas M.D.   On: 03/18/2021 13:19   DG Chest Port 1 View  Result Date: 03/18/2021 CLINICAL DATA:  Shortness of breath, history of COPD and smoking. EXAM: PORTABLE CHEST 1 VIEW COMPARISON:  Chest radiograph 02/21/2021 FINDINGS: Interval removal of endotracheal tube and enteric tube. Stable cardiomediastinal silhouette with tortuous thoracic aorta. Aortic calcifications. Lungs are clear. No acute osseous abnormality. IMPRESSION: No acute cardiopulmonary abnormality. Aortic Atherosclerosis (ICD10-I70.0). Electronically Signed   By: Ileana Roup M.D.   On: 03/18/2021 09:08    Anti-infectives: Anti-infectives (From admission, onward)    Start     Dose/Rate Route Frequency Ordered Stop   03/19/21 0600  cefoTEtan (CEFOTAN) 1 g in sodium chloride 0.9 % 100 mL IVPB        1 g 200  mL/hr over 30 Minutes Intravenous On call to O.R. 03/18/21 1412 03/19/21 0706   03/18/21 1516  sodium chloride 0.9 % with cefoTEtan (CEFOTAN) ADS Med       Note to Pharmacy: Renda Rolls L: cabinet override      03/18/21 1516 03/19/21 0329       Assessment/Plan: s/p Procedure(s): INGUINAL HERNIA REPAIR (Left) SMALL BOWEL RESECTION (N/A) INSERTION OF MESH (Left) UMBILICAL HERNIA REPAIR (N/A) Advance diet  PT to evaluate for ambulation ability  Decrease IV fluids  Disposition dependent upon his ability to ambulate and care for self    LOS: 2 days    Marcello Moores A Helia Haese 03/20/2021

## 2021-03-21 ENCOUNTER — Encounter (HOSPITAL_COMMUNITY): Payer: Self-pay | Admitting: Surgery

## 2021-03-21 ENCOUNTER — Telehealth: Payer: Self-pay | Admitting: Cardiovascular Disease

## 2021-03-21 LAB — BASIC METABOLIC PANEL
Anion gap: 6 (ref 5–15)
BUN: 14 mg/dL (ref 8–23)
CO2: 25 mmol/L (ref 22–32)
Calcium: 8 mg/dL — ABNORMAL LOW (ref 8.9–10.3)
Chloride: 102 mmol/L (ref 98–111)
Creatinine, Ser: 0.85 mg/dL (ref 0.61–1.24)
GFR, Estimated: >60 mL/min (ref >60)
Glucose, Bld: 126 mg/dL — ABNORMAL HIGH (ref 70–99)
Potassium: 3.7 mmol/L (ref 3.5–5.1)
Sodium: 133 mmol/L — ABNORMAL LOW (ref 135–145)

## 2021-03-21 LAB — CBC
HCT: 36.6 % — ABNORMAL LOW (ref 39.0–52.0)
Hemoglobin: 12.3 g/dL — ABNORMAL LOW (ref 13.0–17.0)
MCH: 32.2 pg (ref 26.0–34.0)
MCHC: 33.6 g/dL (ref 30.0–36.0)
MCV: 95.8 fL (ref 80.0–100.0)
Platelets: 124 10*3/uL — ABNORMAL LOW (ref 150–400)
RBC: 3.82 MIL/uL — ABNORMAL LOW (ref 4.22–5.81)
RDW: 12.8 % (ref 11.5–15.5)
WBC: 10.9 10*3/uL — ABNORMAL HIGH (ref 4.0–10.5)
nRBC: 0 % (ref 0.0–0.2)

## 2021-03-21 MED ORDER — ACETAMINOPHEN 500 MG PO TABS
1000.0000 mg | ORAL_TABLET | Freq: Four times a day (QID) | ORAL | Status: DC
  Administered 2021-03-21 – 2021-03-28 (×21): 1000 mg via ORAL
  Filled 2021-03-21 (×26): qty 2

## 2021-03-21 MED ORDER — PANTOPRAZOLE SODIUM 40 MG PO TBEC: 40.0000 mg | DELAYED_RELEASE_TABLET | Freq: Every day | ORAL | Status: DC

## 2021-03-21 MED ORDER — POLYETHYLENE GLYCOL 3350 17 G PO PACK
17.0000 g | PACK | Freq: Every day | ORAL | Status: DC
  Administered 2021-03-21 – 2021-03-28 (×6): 17 g via ORAL
  Filled 2021-03-21 (×7): qty 1

## 2021-03-21 MED ORDER — ENTRESTO 97-103 MG PO TABS: 1.0000 | ORAL_TABLET | Freq: Two times a day (BID) | ORAL | 0 refills | Status: DC

## 2021-03-21 MED ORDER — ENOXAPARIN SODIUM 40 MG/0.4ML IJ SOSY
40.0000 mg | PREFILLED_SYRINGE | INTRAMUSCULAR | Status: DC
  Administered 2021-03-21 – 2021-03-28 (×8): 40 mg via SUBCUTANEOUS
  Filled 2021-03-21 (×8): qty 0.4

## 2021-03-21 MED ORDER — GLYCERIN (LAXATIVE) 2 G RE SUPP
1.0000 | Freq: Once | RECTAL | Status: AC
  Administered 2021-03-21: 1 via RECTAL
  Filled 2021-03-21 (×2): qty 1

## 2021-03-21 MED ORDER — MORPHINE SULFATE (PF) 2 MG/ML IV SOLN: 2.0000 mg | INTRAVENOUS | Status: DC | PRN

## 2021-03-21 NOTE — Evaluation (Signed)
Physical Therapy Evaluation Patient Details Name: [name] MRN: [id number] DOB: 1945-09-02 Today's Date: 03/21/2021  History of Present Illness  Pt is a 75 y/o male presenting w/ N/V due to incarcerated inguinal hernia 12/2 s/p OR same date. Pt with recent D/C 11/30 from CIR after SDH. PMHx: prostate CA, AAA, COPD, CAD, CVA, HTN, HLD, CHF  Clinical Impression  Pt with decreased mobility, safety and function with impaired cognition and limited caregiver. 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 Pt currently needs physical assist with transfers and mobility with son not present to confirm if he can care for both parents at home. ST-SNF recommended . Pt encouraged to increased mobility with nursing assist and agreeable to plan.     Recommendations for follow up therapy are one component of a multi-disciplinary discharge planning process, led by the attending physician.  Recommendations may be updated based on patient status, additional functional criteria and insurance authorization.  Follow Up Recommendations Skilled nursing-short term rehab (<3 hours/day) (unless family able to provide physical assist)    Assistance Recommended at Discharge Frequent or constant Supervision/Assistance  Functional Status Assessment Patient has had a recent decline in their functional status and demonstrates the ability to make significant improvements in function in a reasonable and predictable amount of time.  Equipment Recommendations  BSC/3in1    Recommendations for Other Services       Precautions / Restrictions Precautions Precautions: Fall Restrictions Weight Bearing Restrictions: No      Mobility  Bed Mobility Overal bed mobility: Needs Assistance Bed Mobility: Supine to Sit     Supine to sit: Min assist     General bed mobility comments: min assist to elevate trunk from surface with bed flat    Transfers Overall transfer level:  Needs assistance   Transfers: Sit to/from Stand Sit to Stand: Min assist;Min guard           General transfer comment: minA for boost up into standing from low bed surface, cues for hand placement with increased time to rise with 3 attempts prior to rising. Rising from [facility] with minguard with use of rails, decreased control of descent    Ambulation/Gait Ambulation/Gait assistance: Min assist Gait Distance (Feet): 10 Feet Assistive device: Rolling walker (2 wheels) Gait Pattern/deviations: Step-through pattern;Decreased stride length;Trunk flexed   Gait velocity interpretation: <1.31 ft/sec, indicative of household ambulator   General Gait Details: cues for posture with assist for balance with increased time. pt with pausing with gait x 2 needing cue to restart. Cues for hand placement as pt reaching for environmental support. Pt walked 10' x 2 trials with seated rest at bathroom and declined further ambulation. Cues for direction  Stairs            Wheelchair Mobility    Modified Rankin (Stroke Patients Only)       Balance Overall balance assessment: Needs assistance Sitting-balance support: Bilateral upper extremity supported;Feet supported Sitting balance-Leahy Scale: Fair Sitting balance - Comments: EOB and toilet without assist   Standing balance support: Bilateral upper extremity supported;During functional activity;Reliant on assistive device for balance Standing balance-Leahy Scale: Poor Standing balance comment: RW for standing                             Pertinent Vitals/Pain Pain Assessment: 0-10 Pain Score: 10-Worst pain ever Pain Location: shoulders, back and stomach Pain Descriptors / Indicators: Aching;Sore  Pain Intervention(s): Limited activity within patient's tolerance;Monitored during session    [place] expects to be discharged to:: Private residence Living Arrangements: Spouse/significant other Available Help at  Discharge: Family;Available 24 hours/day Type of Home: House Home Access: Ramped entrance       Home Layout: One level Home Equipment: Conservation officer, nature (2 wheels);Cane - single point;Shower seat Additional Comments: Indicates that he usually ambulates with the single point cane    Prior Function Prior Level of Function : Independent/Modified Independent             Mobility Comments: pt uses cane for "safety" per his report       Hand Dominance        Extremity/Trunk Assessment                Communication      Cognition Arousal/Alertness: Lethargic Behavior During Therapy: Flat affect Overall Cognitive Status: Impaired/Different from baseline Area of Impairment: Safety/judgement;Awareness;Problem solving                 Orientation Level: Disoriented to;Time Current Attention Level: Sustained Memory: Decreased short-term memory Following Commands: Follows one step commands inconsistently;Follows one step commands with increased time;Follows multi-step commands inconsistently;Follows multi-step commands with increased time Safety/Judgement: Decreased awareness of safety;Decreased awareness of deficits   Problem Solving: Slow processing;Requires verbal cues          General Comments      Exercises General Exercises - Lower Extremity Long Arc Quad: AROM;Both;Seated;10 reps   Assessment/Plan    PT Assessment Patient needs continued PT services  PT Problem List Decreased strength;Decreased activity tolerance;Decreased balance;Decreased mobility;Decreased knowledge of use of DME;Decreased knowledge of precautions;Decreased coordination;Decreased cognition       PT Treatment Interventions DME instruction;Gait training;Functional mobility training;Therapeutic activities;Therapeutic exercise;Balance training;Patient/family education    PT Goals (Current goals can be found in the Care Plan section)  Acute Rehab PT Goals PT Goal Formulation: With  patient Time For Goal Achievement: 04/04/21 Potential to Achieve Goals: Fair    Frequency Min 3X/week   Barriers to discharge Decreased caregiver support      Co-evaluation               AM-PAC PT "6 Clicks" Mobility  Outcome Measure Help needed turning from your back to your side while in a flat bed without using bedrails?: A Little Help needed moving from lying on your back to sitting on the side of a flat bed without using bedrails?: A Little Help needed moving to and from a bed to a chair (including a wheelchair)?: A Little Help needed standing up from a chair using your arms (e.g., wheelchair or bedside chair)?: A Little Help needed to walk in hospital room?: Total Help needed climbing 3-5 steps with a railing? : Total 6 Click Score: 14    End of Session Equipment Utilized During Treatment: Gait belt Activity Tolerance: Patient tolerated treatment well Patient left: in chair;with call bell/phone within reach;with chair alarm set Nurse Communication: Mobility status PT Visit Diagnosis: Muscle weakness (generalized) (M62.81);History of falling (Z91.81);Other abnormalities of gait and mobility (R26.89);Difficulty in walking, not elsewhere classified (R26.2)    Time: 6761-9509 PT Time Calculation (min) (ACUTE ONLY): 32 min   Charges:   PT Evaluation $PT Eval Moderate Complexity: 1 Mod          [place], PT Acute Rehabilitation Services Pager: [phone] Office: [phone]   Sandy Salaam Chayse Gracey 03/21/2021, 11:49 AM

## 2021-03-21 NOTE — Telephone Encounter (Signed)
Requested Prescriptions   Signed Prescriptions Disp Refills   sacubitril-valsartan (ENTRESTO) 97-103 MG 180 tablet 0    Sig: Take 1 tablet by mouth 2 (two) times daily.    Authorizing Provider: Minna Merritts    Ordering User: Raelene Bott, Duaine Radin L

## 2021-03-21 NOTE — Discharge Instructions (Signed)
[facility]      [facility] [phone]  OPEN ABDOMINAL SURGERY: POST OP INSTRUCTIONS  Always review your discharge instruction sheet given to you by the facility where your surgery was performed.  IF YOU HAVE DISABILITY OR FAMILY LEAVE FORMS, YOU MUST BRING THEM TO THE OFFICE FOR PROCESSING.  PLEASE DO NOT GIVE THEM TO YOUR DOCTOR.  A prescription for pain medication may be given to you upon discharge.  Take your pain medication as prescribed, if needed.  If narcotic pain medicine is not needed, then you may take acetaminophen (Tylenol) or ibuprofen (Advil) as needed. Take your usually prescribed medications unless otherwise directed. If you need a refill on your pain medication, please contact your pharmacy. They will contact our office to request authorization.  Prescriptions will not be filled after 5pm or on week-ends. You should follow a light diet the first few days after arrival home, such as soup and crackers, pudding, etc.unless your doctor has advised otherwise. A high-fiber, low fat diet can be resumed as tolerated.   Be sure to include lots of fluids daily. Most patients will experience some swelling and bruising on the chest and neck area.  Ice packs will help.  Swelling and bruising can take several days to resolve Most patients will experience some swelling and bruising in the area of the incision. Ice pack will help. Swelling and bruising can take several days to resolve..  It is common to experience some constipation if taking pain medication after surgery.  Increasing fluid intake and taking a stool softener will usually help or prevent this problem from occurring.  A mild laxative (Milk of Magnesia or Miralax) should be taken according to package directions if there are no bowel movements after 48 hours.  You may have steri-strips (small skin tapes) in place directly over the incision.  These strips should be left on the skin for 7-10 days.  If your surgeon used skin  glue on the incision, you may shower in 24 hours.  The glue will flake off over the next 2-3 weeks.  Any sutures or staples will be removed at the office during your follow-up visit. You may find that a light gauze bandage over your incision may keep your staples from being rubbed or pulled. You may shower and replace the bandage daily. ACTIVITIES:  You may resume regular (light) daily activities beginning the next day--such as daily self-care, walking, climbing stairs--gradually increasing activities as tolerated.  You may have sexual intercourse when it is comfortable.  Refrain from any heavy lifting or straining until approved by your doctor. You may drive when you no longer are taking prescription pain medication, you can comfortably wear a seatbelt, and you can safely maneuver your car and apply brakes Return to Work: ___________________________________ You should see your doctor in the office for a follow-up appointment approximately two weeks after your surgery.  Make sure that you call for this appointment within a day or two after you arrive home to insure a convenient appointment time. OTHER INSTRUCTIONS:  _____________________________________________________________ _____________________________________________________________  WHEN TO CALL YOUR DOCTOR: Fever over 101.0 Inability to urinate Nausea and/or vomiting Extreme swelling or bruising Continued bleeding from incision. Increased pain, redness, or drainage from the incision. Difficulty swallowing or breathing Muscle cramping or spasms. Numbness or tingling in hands or feet or around lips.  The clinic staff is available to answer your questions during regular business hours.  Please don't hesitate to call and ask to speak to one of   the nurses if you have concerns.  For further questions, please visit [email or web address]  

## 2021-03-21 NOTE — Progress Notes (Addendum)
Notified by RN that pt got out of bed to go to bathroom and fell. He did hit head on floor. No complaints.  Bed alarm went off and staff went to room immediately but he had already fallen.  Obtain CT head with head injury when fell.    Addendum: No fractures on xrays. L/S spine xray reads "wire density over left paraspinal soft tissues concerning for a foreign body" Went to bedside and examined patient.  No pain to palpation of back and no abrasions, cuts or bleeding. No penetrating trauma noted. Pt states pain improved after getting pain medication.

## 2021-03-21 NOTE — TOC Initial Note (Signed)
Transition of Care [facility]) - Initial/Assessment Note    Patient Details  Name: [name] MRN: [id number] Date of Birth: 04/12/1946  [facility] CM/SW Contact:    Benard Halsted, LCSW Phone Number: 03/21/2021, 4:34 PM  Clinical Narrative:                 CSW received consult for possible SNF at time of discharge. CSW spoke with patient at bedside but he was confused and thought his wife was in the room. He agree to have CSW call her since he was not able to respond to CSW's questions. CSW spoke with patient's wife as well as their caregiver, Lorie. They reported that patient has two hired caregivers during the day and his sons stay the night so they prefer patient to return home. Spouse reported that she would like home health services if the insurance will pay for it. CSW discussed equipment needs and patient already has a motorized wheelchair, walker, and bedside commode. CSW confirmed PCP and address. Wife stated that they need a day's notice to arrange transportation home.    Expected Discharge Plan: [place] Barriers to Discharge: No Barriers Identified   Patient Goals and CMS Choice Patient states their goals for this hospitalization and ongoing recovery are:: Return home CMS Medicare.gov Compare Post Acute Care list provided to:: Patient Represenative (must comment) Choice offered to / list presented to : Spouse, Patient  Expected Discharge Plan and Services Expected Discharge Plan: [place] In-house Referral: Clinical Social Work Discharge Planning Services: CM Consult Post Acute Care Choice: [place] arrangements for the past 2 months: Single Family Home                                      Prior Living Arrangements/Services Living arrangements for the past 2 months: Single Family Home Lives with:: Spouse Patient language and need for interpreter reviewed:: Yes Do you feel safe going back to the place  where you live?: Yes      Need for Family Participation in Patient Care: Yes (Comment) Care giver support system in place?: Yes (comment) Current home services: DME (WC, walker, BSC) Criminal Activity/Legal Involvement Pertinent to Current Situation/Hospitalization: No - Comment as needed  Activities of Daily Living Home Assistive Devices/Equipment: None ADL Screening (condition at time of admission) Patient's cognitive ability adequate to safely complete daily activities?: Yes Is the patient deaf or have difficulty hearing?: No Does the patient have difficulty seeing, even when wearing glasses/contacts?: Yes Does the patient have difficulty concentrating, remembering, or making decisions?: Yes Patient able to express need for assistance with ADLs?: Yes Does the patient have difficulty dressing or bathing?: No Independently performs ADLs?: No Communication: Independent Dressing (OT): Needs assistance Is this a change from baseline?: Change from baseline, expected to last >3 days Grooming: Needs assistance Is this a change from baseline?: Change from baseline, expected to last >3 days Feeding: Needs assistance Is this a change from baseline?: Change from baseline, expected to last >3 days Bathing: Needs assistance Is this a change from baseline?: Change from baseline, expected to last >3 days Toileting: Needs assistance Is this a change from baseline?: Change from baseline, expected to last >3days In/Out Bed: Needs assistance Is this a change from baseline?: Change from baseline, expected to last >3 days Walks in Home: Needs assistance Is this a change from  baseline?: Change from baseline, expected to last >3 days Does the patient have difficulty walking or climbing stairs?: Yes Weakness of Legs: Both Weakness of Arms/Hands: None  Permission Sought/Granted Permission sought to share information with : Facility Sport and exercise psychologist, Family Supports Permission granted to share  information with : Yes, Verbal Permission Granted  Share Information with NAME: Vickie  Permission granted to share info w AGENCY: [place] granted to share info w Relationship: Spouse  Permission granted to share info w Contact Information: [phone]  Emotional Assessment Appearance:: Appears stated age Attitude/Demeanor/Rapport: Lethargic Affect (typically observed):  (Confused) Orientation: : Oriented to Self, Oriented to Situation Alcohol / Substance Use: Not Applicable Psych Involvement: No (comment)  Admission diagnosis:  Small bowel obstruction ([place]) [K56.609] Incarcerated inguinal hernia, unilateral [K40.30] Incarcerated inguinal hernia [K40.30] Patient Active Problem List   Diagnosis Date Noted   Incarcerated inguinal hernia 03/18/2021   Orthostatic hypotension    Chronic combined systolic and diastolic congestive heart failure (HCC)    Vascular headache    Pressure injury of skin 03/01/2021   Intraparenchymal hemorrhage of brain ([place]) 02/26/2021   SDH (subdural hematoma) 02/21/2021   Syncope 02/21/2021   Fall 02/21/2021   Hypertensive urgency 02/21/2021   HTN (hypertension) 02/21/2021   Stroke ([place]) 02/21/2021   Chronic combined systolic and diastolic CHF (congestive heart failure) ([place]) 02/21/2021   Leukocytosis 02/21/2021   Elevated troponin 02/21/2021   Zygomatic arch fracture ([place]) 02/21/2021   UTI (urinary tract infection) 02/21/2021   Sepsis ([place]) 02/21/2021   Other spondylosis with radiculopathy, lumbar region 09/04/2018   Centrilobular emphysema ([place]) 07/19/2018   Dilated cardiomyopathy ([place]) 07/19/2018   Coronary artery disease of native artery of native heart with stable angina pectoris ([place]) 01/20/2018   AAA (abdominal aortic aneurysm) without rupture 10/30/2017   Personal history of tobacco use, presenting hazards to health 08/29/2016   COPD exacerbation ([place]) 04/03/2015   Medicare annual wellness visit, subsequent 05/23/2014   Barrett's  esophagus 05/20/2014   Constipation 05/20/2014   Arthritis of hand 05/07/2014   COPD (chronic obstructive pulmonary disease) ([place]) 11/06/2011   Pulmonary nodule 11/06/2011   Tobacco abuse 02/12/2011   Tobacco abuse counseling 02/12/2011   CAD, NATIVE VESSEL 10/18/2008   COLONIC POLYPS 08/05/2008   Hyperlipidemia 08/05/2008   Essential hypertension 08/05/2008   GERD 08/05/2008   PCP:  Rusty Aus, MD Pharmacy:   [facility], [place] [address] [place] [address] Phone: [phone] Fax: [phone]  [facility], [place] - [address] [place] [address] Phone: [phone] Fax: [phone]     Social Determinants of Health (SDOH) Interventions    Readmission Risk Interventions No flowsheet data found.

## 2021-03-21 NOTE — Progress Notes (Signed)
OT Cancellation Note  Patient Details Name: RIYANSH GERSTNER MRN: [id number] DOB: 19-Nov-1945   Cancelled Treatment:    Reason Eval/Treat Not Completed: Other (comment) (Pt completed PT and in chair. Eating lunch. Would like to sit longer. Will return as schedule allows.)  [place], OTR/L Acute Rehab Pager: [phone] Office: [phone] 03/21/2021, 12:19 PM

## 2021-03-21 NOTE — Progress Notes (Signed)
3 Days Post-Op  Subjective: Patient states pain is controlled with meds.  No flatus or BM yet.  Tolerating a soft diet some, not eating a ton.  No nausea.  Hasn't ambulated since surgery.    ROS: See above, otherwise other systems negative  Objective: Vital signs in last 24 hours: Temp:  [97.6 F (36.4 C)-99.8 F (37.7 C)] 97.6 F (36.4 C) (12/05 0816) Pulse Rate:  [62-87] 64 (12/05 0816) Resp:  [18] 18 (12/05 0816) BP: (112-127)/(58-71) 114/66 (12/05 0816) SpO2:  [92 %-94 %] 93 % (12/05 0816) Weight:  [75.8 kg] 75.8 kg (12/04 2135) Last BM Date: 03/19/21  Intake/Output from previous day: 12/04 0701 - 12/05 0700 In: 1217.1 [P.O.:660; I.V.:557.1] Out: 0  Intake/Output this shift: No intake/output data recorded.  PE: Abd: soft, appropriately tender, few BS, ND, incision midline and L groin stable with staples in place, c/d/i  Lab Results:  Recent Labs    03/20/21 1502 03/21/21 0442  WBC 10.8* 10.9*  HGB 12.4* 12.3*  HCT 38.1* 36.6*  PLT 121* 124*   BMET Recent Labs    03/20/21 1502 03/21/21 0442  NA 135 133*  K 4.1 3.7  CL 99 102  CO2 27 25  GLUCOSE 95 126*  BUN 19 14  CREATININE 0.91 0.85  CALCIUM 8.1* 8.0*   PT/INR No results for input(s): LABPROT, INR in the last 72 hours. CMP     Component Value Date/Time   NA 133 (L) 03/21/2021 0442   NA 145 04/23/2011 0252   K 3.7 03/21/2021 0442   K 3.9 04/23/2011 0252   CL 102 03/21/2021 0442   CL 107 04/23/2011 0252   CO2 25 03/21/2021 0442   CO2 31 04/23/2011 0252   GLUCOSE 126 (H) 03/21/2021 0442   GLUCOSE 93 04/23/2011 0252   BUN 14 03/21/2021 0442   BUN 16 04/23/2011 0252   CREATININE 0.85 03/21/2021 0442   CREATININE 1.12 10/03/2011 1625   CALCIUM 8.0 (L) 03/21/2021 0442   CALCIUM 8.6 04/23/2011 0252   PROT 6.4 (L) 03/18/2021 0840   PROT 6.6 03/04/2013 0831   PROT 6.2 (L) 04/21/2011 1137   ALBUMIN 3.5 03/18/2021 0840   ALBUMIN 4.1 03/04/2013 0831   ALBUMIN 3.2 (L) 04/23/2011 0252   AST  18 03/18/2021 0840   AST 33 04/21/2011 1137   ALT 15 03/18/2021 0840   ALT 22 04/21/2011 1137   ALKPHOS 78 03/18/2021 0840   ALKPHOS 111 04/21/2011 1137   BILITOT 1.3 (H) 03/18/2021 0840   BILITOT 1.0 04/21/2011 1137   GFRNONAA >60 03/21/2021 0442   GFRNONAA 68 10/03/2011 1625   GFRAA >60 04/29/2018 1215   GFRAA 78 10/03/2011 1625   Lipase     Component Value Date/Time   LIPASE 22 03/18/2021 0840       Studies/Results: No results found.  Anti-infectives: Anti-infectives (From admission, onward)    Start     Dose/Rate Route Frequency Ordered Stop   03/19/21 0600  cefoTEtan (CEFOTAN) 1 g in sodium chloride 0.9 % 100 mL IVPB        1 g 200 mL/hr over 30 Minutes Intravenous On call to O.R. 03/18/21 1412 03/19/21 0706   03/18/21 1516  sodium chloride 0.9 % with cefoTEtan (CEFOTAN) ADS Med       Note to Pharmacy: Renda Rolls L: cabinet override      03/18/21 1516 03/19/21 0329        Assessment/Plan POD 3, s/p open LIH repair with phasix mesh,  ex lap with SBR and primary UHR, Dr. Bobbye Morton 12/2 -patient needs to mobilize -pulm toilet -cont soft diet -add suppository and daily miralax -from a surgical standpoint, he would be safe for discharge once he has bowel function, likely as early as tomorrow.  However, he has not mobilized at all and appears weak.   -needs to work with therapies for recommendation on disposition.   FEN - soft VTE - Lovenox ID - none currently needed  COPD CAD HTN Prostate cancer PVD CVA   LOS: 3 days    Henreitta Cea , [facility] 03/21/2021, 9:21 AM Please see Amion for pager number during day hours 7:00am-4:30pm or 7:00am -11:30am on weekends

## 2021-03-21 NOTE — Evaluation (Signed)
Occupational Therapy Evaluation Patient Details Name: [name] MRN: [id number] DOB: 1946-02-25 Today's Date: 03/21/2021   History of Present Illness 75 y/o male presenting w/ N/V due to incarcerated inguinal hernia 12/2 s/p OR same date. Pt with recent D/C 11/30 from CIR after SDH. PMHx: prostate CA, AAA, COPD, CAD, CVA, HTN, HLD, CHF   Clinical Impression   PTA, pt was living with his wife and reports he performs BADLs. Unsure of reliability of information as pt with decreased awareness and attention. Pt currently requiring Mod A for UB ADLs, Mod-Max A for LB ADLs, and Min A for functional transfers with RW.  Pt presenting with decreased balance, strength, and cognition. Pt would benefit from further acute OT to facilitate safe dc. Recommend dc to SNF for further OT to optimize safety, independence with ADLs, and return to PLOF.      Recommendations for follow up therapy are one component of a multi-disciplinary discharge planning process, led by the attending physician.  Recommendations may be updated based on patient status, additional functional criteria and insurance authorization.   Follow Up Recommendations  Skilled nursing-short term rehab (<3 hours/day)    Assistance Recommended at Discharge Frequent or constant Supervision/Assistance  Functional Status Assessment  Patient has had a recent decline in their functional status and demonstrates the ability to make significant improvements in function in a reasonable and predictable amount of time.  Equipment Recommendations  None recommended by OT    Recommendations for Other Services PT consult     Precautions / Restrictions Precautions Precautions: Fall      Mobility Bed Mobility Overal bed mobility: Needs Assistance Bed Mobility: Sit to Supine       Sit to supine: Min assist   General bed mobility comments: Min A for BLE managment    Transfers Overall transfer level: Needs assistance Equipment used: Rolling  walker (2 wheels) Transfers: Bed to chair/wheelchair/BSC   Stand pivot transfers: Min assist         General transfer comment: Min A for balance and safety      Balance Overall balance assessment: Needs assistance Sitting-balance support: No upper extremity supported;Feet supported Sitting balance-Leahy Scale: Fair     Standing balance support: Bilateral upper extremity supported;During functional activity Standing balance-Leahy Scale: Poor                             ADL either performed or assessed with clinical judgement   ADL Overall ADL's : Needs assistance/impaired Eating/Feeding: Set up;Supervision/ safety;Sitting   Grooming: Set up;Supervision/safety;Sitting   Upper Body Bathing: Moderate assistance;Sitting   Lower Body Bathing: Moderate assistance;Sit to/from stand   Upper Body Dressing : Moderate assistance;Sitting   Lower Body Dressing: Maximal assistance;Sit to/from stand   Toilet Transfer: Minimal assistance;Stand-pivot;Rolling walker (2 wheels) (simulated to recliner)           Functional mobility during ADLs: Minimal assistance;Rolling walker (2 wheels) General ADL Comments: Pt presenting with decreased cognition and activity tolerance     Vision Baseline Vision/History: 1 Wears glasses Patient Visual Report: No change from baseline       Perception     Praxis      Pertinent Vitals/Pain Pain Assessment: Faces Faces Pain Scale: Hurts little more Pain Intervention(s): Monitored during session;Repositioned     Hand Dominance Right   Extremity/Trunk Assessment Upper Extremity Assessment Upper Extremity Assessment: Generalized weakness;Difficult to assess due to impaired cognition   Lower Extremity Assessment Lower Extremity  Assessment: Defer to PT evaluation   Cervical / Trunk Assessment Cervical / Trunk Assessment: Normal   Communication Communication Communication: Expressive difficulties   Cognition  Arousal/Alertness: Lethargic Behavior During Therapy: Flat affect Overall Cognitive Status: No family/caregiver present to determine baseline cognitive functioning Area of Impairment: Safety/judgement;Awareness;Problem solving                         Safety/Judgement: Decreased awareness of safety;Decreased awareness of deficits Awareness: Intellectual Problem Solving: Slow processing;Requires verbal cues General Comments: Poor answering of questions. Requiring significant time.     General Comments  VSS    Exercises     Shoulder Instructions      Home Living Family/patient expects to be discharged to:: Private residence Living Arrangements: Spouse/significant other Available Help at Discharge: Family;Available 24 hours/day Type of Home: House Home Access: Ramped entrance     Home Layout: One level     Bathroom Shower/Tub: Occupational psychologist: Standard Bathroom Accessibility: Yes   Home Equipment: Conservation officer, nature (2 wheels);Cane - single point;Shower seat   Additional Comments: Indicates that he usually ambulates with the single point cane  Lives With: Spouse    Prior Functioning/Environment Prior Level of Function : Independent/Modified Independent             Mobility Comments: pt uses cane for "safety" per his report          OT Problem List: Decreased strength;Decreased activity tolerance;Impaired balance (sitting and/or standing);Decreased cognition;Decreased safety awareness;Decreased knowledge of use of DME or AE;Decreased knowledge of precautions;Decreased coordination      OT Treatment/Interventions: Self-care/ADL training;Therapeutic exercise;Neuromuscular education;Energy conservation;DME and/or AE instruction;Manual therapy;Therapeutic activities;Cognitive remediation/compensation;Patient/family education;Balance training    OT Goals(Current goals can be found in the care plan section) Acute Rehab OT Goals Patient Stated  Goal: Get better OT Goal Formulation: With patient Time For Goal Achievement: 04/04/21 Potential to Achieve Goals: Good  OT Frequency: Min 2X/week   Barriers to D/C: Decreased caregiver support          Co-evaluation              AM-PAC OT "6 Clicks" Daily Activity     Outcome Measure Help from another person eating meals?: A Little Help from another person taking care of personal grooming?: A Little Help from another person toileting, which includes using toliet, bedpan, or urinal?: A Little Help from another person bathing (including washing, rinsing, drying)?: A Lot Help from another person to put on and taking off regular upper body clothing?: A Little Help from another person to put on and taking off regular lower body clothing?: A Lot 6 Click Score: 16   End of Session Equipment Utilized During Treatment: Gait belt;Rolling walker (2 wheels) Nurse Communication: Mobility status  Activity Tolerance: Patient tolerated treatment well Patient left: in bed;with call bell/phone within reach;with bed alarm set  OT Visit Diagnosis: Unsteadiness on feet (R26.81);Muscle weakness (generalized) (M62.81)                Time: 1638-4665 OT Time Calculation (min): 13 min Charges:  OT General Charges $OT Visit: 1 Visit OT Evaluation $OT Eval Moderate Complexity: [place], OTR/L Acute Rehab Pager: [phone] Office: [place] 03/21/2021, 5:58 PM

## 2021-03-21 NOTE — Care Management Important Message (Signed)
Important Message  Patient Details  Name: [name] MRN: [id number] Date of Birth: 1945/09/27   Medicare Important Message Given:  Yes     Orbie Pyo 03/21/2021, 4:23 PM

## 2021-03-21 NOTE — Telephone Encounter (Signed)
*  STAT* If patient is at the pharmacy, call can be transferred to refill team.   1. Which medications need to be refilled? (please list name of each medication and dose if known) Entresto 97-103 mg 1 twice a day  2. Which pharmacy/location (including street and city if local pharmacy) is medication to be sent to? Home delivery  3. Do they need a 30 day or 90 day supply? 90day

## 2021-03-21 NOTE — Progress Notes (Signed)
PROGRESS NOTE    [name]  HBZ:[id number] DOB: [birth year] DOA: 03/18/2021 PCP: Rusty Aus, MD   Brief Narrative:  [name] is a 75 y.o. male with medical history significant of AAA; COPD; CAD; chronic systolic CHF; PAD; HTN; HLD; and prostate CA presenting with n/v.   He was previously admitted to the neurosurgery service from 11/7-12 with SDH and was discharged to [facility] and remained there through 11/30.  He reports that he progressed well while there and had no apparent issues.  However, he arrived home on 11/30 and started vomiting shortly thereafter with any attempt at food/drink intake.   He has noticed a bulge in his left groin.  He was found to have an incarcerated hernia and is going to the OR now.   Of note, his wife has had a complicated hospitalization following hip fracture.  He reports that she was discharged today, after 5 weeks of hospitalization.      Incarcerated inguinal hernia, started vomiting yesterday.  Fall with LOC, intracranial hemorrhage, d/c from CIR on 11/30. Procedure: open left recurrent, strangulated inguinal hernia repair with phasix ST absorbable mesh, exploratory laparotomy, small bowel resection with primary anastomosis, primary repair of umbilical hernia   Pre-op diagnosis: incarcerated left inguinal hernia, reducible umbilical hernia Post-op diagnosis: strangulated left inguinal hernia, incarcerated umbilical hernia, 4cm  Assessment & Plan:   Principal Problem:   Incarcerated inguinal hernia Active Problems:   Hyperlipidemia   Essential hypertension   Tobacco abuse   AAA (abdominal aortic aneurysm) without rupture   Centrilobular emphysema (HCC)   Chronic combined systolic and diastolic CHF (congestive heart failure) (HCC)   Intraparenchymal hemorrhage of brain ([place])  Incarcerated inguinal hernia with small bowel obstruction: S/p surgical repair 03/18/2021. Tolerating soft diet but not passing flatus or bowel movement.  Management per  general surgery.  Advised to ambulate more.  Chronic combined CHF: Echo March 2022 shows EF of 30 to 35% with grade 1 diastolic dysfunction.  He appears to be well compensated.  Continue Entresto.  Holding Aldactone.  Acute thrombocytopenia: Likely due to acute illness.  Stable.  No signs of bleeding.  Monitor.  Essential hypertension: Controlled, continue Coreg and Entresto.  Hyperlipidemia: Continue Lipitor.  COPD: Not in exacerbation.  Continue Symbicort and albuterol.  PVD: -AAA, 3.8 cm -s/p aortobi-iliac stent graft, patent -Severe atherosclerotic disease on CT  DVT prophylaxis: enoxaparin (LOVENOX) injection 40 mg Start: 03/21/21 1015 SCDs Start: 03/18/21 1508   Code Status: DNR  Family Communication:  None present at bedside.  Plan of care discussed with patient in length and he verbalized understanding and agreed with it.  Status is: Inpatient  Remains inpatient appropriate because: Still recovering from small bowel obstruction.  Estimated body mass index is 22.66 kg/m as calculated from the following:   Height as of this encounter: 6' (1.829 m).   Weight as of this encounter: 75.8 kg.  Pressure Injury 02/26/21 Coccyx Mid;Posterior Stage 2 -  Partial thickness loss of dermis presenting as a shallow open injury with a red, pink wound bed without slough. (Active)  02/26/21 1702  Location: Coccyx  Location Orientation: Mid;Posterior  Staging: Stage 2 -  Partial thickness loss of dermis presenting as a shallow open injury with a red, pink wound bed without slough.  Wound Description (Comments):   Present on Admission: Yes    Nutritional Assessment: Body mass index is 22.66 kg/m.[name] Seen by dietician.  I agree with the assessment and plan as outlined below: Nutrition  Status: Nutrition Problem: Increased nutrient needs Etiology: post-op healing Signs/Symptoms: estimated needs    .  Skin Assessment: I have examined the patient's skin and I agree with the wound  assessment as performed by the wound care RN as outlined below: Pressure Injury 02/26/21 Coccyx Mid;Posterior Stage 2 -  Partial thickness loss of dermis presenting as a shallow open injury with a red, pink wound bed without slough. (Active)  02/26/21 1702  Location: Coccyx  Location Orientation: Mid;Posterior  Staging: Stage 2 -  Partial thickness loss of dermis presenting as a shallow open injury with a red, pink wound bed without slough.  Wound Description (Comments):   Present on Admission: Yes    Consultants:  General surgery  Procedures:  As above  Antimicrobials:  Anti-infectives (From admission, onward)    Start     Dose/Rate Route Frequency Ordered Stop   03/19/21 0600  cefoTEtan (CEFOTAN) 1 g in sodium chloride 0.9 % 100 mL IVPB        1 g 200 mL/hr over 30 Minutes Intravenous On call to O.R. 03/18/21 1412 03/19/21 0706   03/18/21 1516  sodium chloride 0.9 % with cefoTEtan (CEFOTAN) ADS Med       Note to Pharmacy: Renda Rolls L: cabinet override      03/18/21 1516 03/19/21 0329          Subjective: Seen and examined.  Complains of some pain in the abdomen but otherwise okay.  Seems to be very weak.  Denies any shortness of breath.  Not passing flatus or having any bowel movement yet.  Objective: Vitals:   03/20/21 2130 03/20/21 2135 03/21/21 0517 03/21/21 0816  BP: (!) 117/58 112/71 117/65 114/66  Pulse: 70 69 62 64  Resp: 18  18 18   Temp: 99.8 F (37.7 C)  97.6 F (36.4 C) 97.6 F (36.4 C)  TempSrc: Oral  Oral Axillary  SpO2: 92%  93% 93%  Weight:  75.8 kg    Height:        Intake/Output Summary (Last 24 hours) at 03/21/2021 1246 Last data filed at 03/21/2021 0900 Gross per 24 hour  Intake 1217.14 ml  Output 0 ml  Net 1217.14 ml   Filed Weights   03/18/21 2057 03/20/21 2135  Weight: 73 kg 75.8 kg    Examination:  General exam: Appears calm and comfortable, appears very weak. Respiratory system: Clear to auscultation.  Poor inspiratory  effort. Cardiovascular system: S1 & S2 heard, RRR. No JVD, murmurs, rubs, gallops or clicks.  Less pitting edema bilateral lower extremity. Gastrointestinal system: Abdomen is nondistended, soft and tender at the incision site. No organomegaly or masses felt.  Diminished bowel sounds. Central nervous system: Alert and oriented. No focal neurological deficits. Extremities: Symmetric 5 x 5 power. Skin: No rashes, lesions or ulcers   Data Reviewed: I have personally reviewed following labs and imaging studies  CBC: Recent Labs  Lab 03/18/21 0840 03/19/21 0256 03/20/21 1502 03/21/21 0442  WBC 13.7* 14.0* 10.8* 10.9*  NEUTROABS 12.3*  --   --   --   HGB 16.9 14.7 12.4* 12.3*  HCT 50.5 44.2 38.1* 36.6*  MCV 95.5 97.4 97.2 95.8  PLT 227 152 121* 299*   Basic Metabolic Panel: Recent Labs  Lab 03/18/21 0840 03/19/21 0256 03/20/21 1502 03/21/21 0442  NA 140 138 135 133*  K 4.6 4.7 4.1 3.7  CL 104 102 99 102  CO2 27 27 27 25   GLUCOSE 150* 136* 95 126*  BUN 23  25* 19 14  CREATININE 1.09 1.25* 0.91 0.85  CALCIUM 9.5 8.3* 8.1* 8.0*   GFR: Estimated Creatinine Clearance: 78 mL/min (by C-G formula based on SCr of 0.85 mg/dL). Liver Function Tests: Recent Labs  Lab 03/18/21 0840  AST 18  ALT 15  ALKPHOS 78  BILITOT 1.3*  PROT 6.4*  ALBUMIN 3.5   Recent Labs  Lab 03/18/21 0840  LIPASE 22   No results for input(s): AMMONIA in the last 168 hours. Coagulation Profile: No results for input(s): INR, PROTIME in the last 168 hours. Cardiac Enzymes: No results for input(s): CKTOTAL, CKMB, CKMBINDEX, TROPONINI in the last 168 hours. BNP (last 3 results) No results for input(s): PROBNP in the last 8760 hours. HbA1C: No results for input(s): HGBA1C in the last 72 hours. CBG: Recent Labs  Lab 03/18/21 0841  GLUCAP 138*   Lipid Profile: No results for input(s): CHOL, HDL, LDLCALC, TRIG, CHOLHDL, LDLDIRECT in the last 72 hours. Thyroid Function Tests: No results for  input(s): TSH, T4TOTAL, FREET4, T3FREE, THYROIDAB in the last 72 hours. Anemia Panel: No results for input(s): VITAMINB12, FOLATE, FERRITIN, TIBC, IRON, RETICCTPCT in the last 72 hours. Sepsis Labs: Recent Labs  Lab 03/18/21 0840  LATICACIDVEN 1.6    Recent Results (from the past 240 hour(s))  Resp Panel by RT-PCR (Flu A&B, Covid) Nasopharyngeal Swab     Status: None   Collection Time: 03/18/21  8:45 AM   Specimen: Nasopharyngeal Swab; Nasopharyngeal(NP) swabs in vial transport medium  Result Value Ref Range Status   SARS Coronavirus 2 by RT PCR NEGATIVE NEGATIVE Final    Comment: (NOTE) SARS-CoV-2 target nucleic acids are NOT DETECTED.  The SARS-CoV-2 RNA is generally detectable in upper respiratory specimens during the acute phase of infection. The lowest concentration of SARS-CoV-2 viral copies this assay can detect is 138 copies/mL. A negative result does not preclude SARS-Cov-2 infection and should not be used as the sole basis for treatment or other patient management decisions. A negative result may occur with  improper specimen collection/handling, submission of specimen other than nasopharyngeal swab, presence of viral mutation(s) within the areas targeted by this assay, and inadequate number of viral copies(<138 copies/mL). A negative result must be combined with clinical observations, patient history, and epidemiological information. The expected result is Negative.  Fact Sheet for Patients:  EntrepreneurPulse.com.au  Fact Sheet for Healthcare Providers:  IncredibleEmployment.be  This test is no t yet approved or cleared by the [place] FDA and  has been authorized for detection and/or diagnosis of SARS-CoV-2 by FDA under an Emergency Use Authorization (EUA). This EUA will remain  in effect (meaning this test can be used) for the duration of the COVID-19 declaration under Section 564(b)(1) of the Act, 21 U.S.C.section  360bbb-3(b)(1), unless the authorization is terminated  or revoked sooner.       Influenza A by PCR NEGATIVE NEGATIVE Final   Influenza B by PCR NEGATIVE NEGATIVE Final    Comment: (NOTE) The Xpert Xpress SARS-CoV-2/FLU/RSV plus assay is intended as an aid in the diagnosis of influenza from Nasopharyngeal swab specimens and should not be used as a sole basis for treatment. Nasal washings and aspirates are unacceptable for Xpert Xpress SARS-CoV-2/FLU/RSV testing.  Fact Sheet for Patients: EntrepreneurPulse.com.au  Fact Sheet for Healthcare Providers: IncredibleEmployment.be  This test is not yet approved or cleared by the [place] FDA and has been authorized for detection and/or diagnosis of SARS-CoV-2 by FDA under an Emergency Use Authorization (EUA). This EUA will remain in  effect (meaning this test can be used) for the duration of the COVID-19 declaration under Section 564(b)(1) of the Act, 21 U.S.C. section 360bbb-3(b)(1), unless the authorization is terminated or revoked.  Performed at [facility], [place] [address], [place], [place] [address]       Radiology Studies: No results found.  Scheduled Meds:  acetaminophen  1,000 mg Oral Q6H   atorvastatin  80 mg Oral Daily   carvedilol  3.125 mg Oral BID WC   cyclobenzaprine  5 mg Oral QHS   enoxaparin (LOVENOX) injection  40 mg Subcutaneous Q24H   feeding supplement  237 mL Oral TID BM   mometasone-formoterol  2 puff Inhalation BID   multivitamin with minerals  1 tablet Oral Daily   nicotine  21 mg Transdermal Daily   pantoprazole  40 mg Oral Daily   polyethylene glycol  17 g Oral Daily   sacubitril-valsartan  1 tablet Oral BID   valproic acid  250 mg Oral BID   Continuous Infusions:   LOS: 3 days   Time spent: 33-minute   Darliss Cheney, MD [facility]  03/21/2021, 12:46 PM  Please page via [place]a [place] and do not message via secure chat for anything urgent.  Secure chat can be used for anything non urgent.  How to contact the [facility] Attending or Consulting provider [place] or covering provider during after hours [place], for this patient?  Check the care team in [facility] and look for a) attending/consulting TR[facility] provider listed and b) the [facility] team listed. Page or secure chat [phone]. Log into [email or web address] and use [place] universal password to access. If you do not have the password, please contact the hospital operator. Locate the [facility] provider you are looking for under [facility] and page to a number that you can be directly reached. If you still have difficulty reaching the provider, please page the [facility] (Director on Call) for the Hospitalists listed on amion for assistance.

## 2021-03-22 ENCOUNTER — Inpatient Hospital Stay (HOSPITAL_COMMUNITY): Payer: PPO

## 2021-03-22 LAB — SURGICAL PATHOLOGY

## 2021-03-22 LAB — BASIC METABOLIC PANEL
Anion gap: 8 (ref 5–15)
BUN: 14 mg/dL (ref 8–23)
CO2: 27 mmol/L (ref 22–32)
Calcium: 8.1 mg/dL — ABNORMAL LOW (ref 8.9–10.3)
Chloride: 98 mmol/L (ref 98–111)
Creatinine, Ser: 0.93 mg/dL (ref 0.61–1.24)
GFR, Estimated: >60 mL/min (ref >60)
Glucose, Bld: 117 mg/dL — ABNORMAL HIGH (ref 70–99)
Potassium: 3.3 mmol/L — ABNORMAL LOW (ref 3.5–5.1)
Sodium: 133 mmol/L — ABNORMAL LOW (ref 135–145)

## 2021-03-22 LAB — CBC WITH DIFFERENTIAL/PLATELET
Abs Immature Granulocytes: 0.05 10*3/uL (ref 0.00–0.07)
Basophils Absolute: 0 10*3/uL (ref 0.0–0.1)
Basophils Relative: 0 %
Eosinophils Absolute: 0.3 10*3/uL (ref 0.0–0.5)
Eosinophils Relative: 3 %
HCT: 37.8 % — ABNORMAL LOW (ref 39.0–52.0)
Hemoglobin: 12.7 g/dL — ABNORMAL LOW (ref 13.0–17.0)
Immature Granulocytes: 1 %
Lymphocytes Relative: 6 %
Lymphs Abs: 0.6 10*3/uL — ABNORMAL LOW (ref 0.7–4.0)
MCH: 32.2 pg (ref 26.0–34.0)
MCHC: 33.6 g/dL (ref 30.0–36.0)
MCV: 95.7 fL (ref 80.0–100.0)
Monocytes Absolute: 0.5 10*3/uL (ref 0.1–1.0)
Monocytes Relative: 6 %
Neutro Abs: 7.8 10*3/uL — ABNORMAL HIGH (ref 1.7–7.7)
Neutrophils Relative %: 84 %
Platelets: 143 10*3/uL — ABNORMAL LOW (ref 150–400)
RBC: 3.95 MIL/uL — ABNORMAL LOW (ref 4.22–5.81)
RDW: 12.5 % (ref 11.5–15.5)
WBC: 9.2 10*3/uL (ref 4.0–10.5)
nRBC: 0 % (ref 0.0–0.2)

## 2021-03-22 MED ORDER — SODIUM CHLORIDE 0.9 % IV SOLN: INTRAVENOUS | Status: DC

## 2021-03-22 MED ORDER — POTASSIUM CHLORIDE CRYS ER 20 MEQ PO TBCR
40.0000 meq | EXTENDED_RELEASE_TABLET | Freq: Once | ORAL | Status: AC
  Administered 2021-03-22: 40 meq via ORAL
  Filled 2021-03-22: qty 2

## 2021-03-22 MED ORDER — QUETIAPINE FUMARATE 25 MG PO TABS
25.0000 mg | ORAL_TABLET | Freq: Every day | ORAL | Status: DC
  Administered 2021-03-22 – 2021-03-27 (×6): 25 mg via ORAL
  Filled 2021-03-22 (×6): qty 1

## 2021-03-22 NOTE — Progress Notes (Signed)
   03/21/21 2335  What Happened  Was fall witnessed? No  Was patient injured? Unsure  Patient found on floor  Found by Staff-comment (Charito.Eliud Polo.Raquel)  Stated prior activity to/from bed, chair, or stretcher  Follow Up  MD notified DR Chotiner  Time MD notified 31  Family notified Yes - comment (Wife Vickie son)  Time family notified [phone]  Additional tests Yes-comment (CT head and xray of back and right side)  Simple treatment Other (comment) (test)

## 2021-03-22 NOTE — NC FL2 (Signed)
[place] LEVEL OF CARE SCREENING TOOL     IDENTIFICATION  Patient Name: [name] Birthdate: [birth year] Sex: male Admission Date (Current Location): 03/18/2021  [facility] and [place] Number:  Herbalist and Address:  The [place], [place] [address], [place], [place] [address]      Provider Number: [id number]  Attending Physician Name and Address:  Darliss Cheney, MD  Relative Name and Phone Number:       Current Level of Care: Hospital Recommended Level of Care: [place] Prior Approval Number:    Date Approved/Denied:   PASRR Number: [id number]  Discharge Plan: SNF    Current Diagnoses: Patient Active Problem List   Diagnosis Date Noted   Incarcerated inguinal hernia 03/18/2021   Orthostatic hypotension    Chronic combined systolic and diastolic congestive heart failure ([place])    Vascular headache    Pressure injury of skin 03/01/2021   Intraparenchymal hemorrhage of brain ([place]) 02/26/2021   SDH (subdural hematoma) 02/21/2021   Syncope 02/21/2021   Fall 02/21/2021   Hypertensive urgency 02/21/2021   HTN (hypertension) 02/21/2021   Stroke ([place]) 02/21/2021   Chronic combined systolic and diastolic CHF (congestive heart failure) ([place]) 02/21/2021   Leukocytosis 02/21/2021   Elevated troponin 02/21/2021   Zygomatic arch fracture ([place]) 02/21/2021   UTI (urinary tract infection) 02/21/2021   Sepsis ([place]) 02/21/2021   Other spondylosis with radiculopathy, lumbar region 09/04/2018   Centrilobular emphysema ([place]) 07/19/2018   Dilated cardiomyopathy ([place]) 07/19/2018   Coronary artery disease of native artery of native heart with stable angina pectoris ([place]) 01/20/2018   AAA (abdominal aortic aneurysm) without rupture 10/30/2017   Personal history of tobacco use, presenting hazards to health 08/29/2016   COPD exacerbation ([place]) 04/03/2015   Medicare annual wellness visit, subsequent 05/23/2014   Barrett's  esophagus 05/20/2014   Constipation 05/20/2014   Arthritis of hand 05/07/2014   COPD (chronic obstructive pulmonary disease) ([place]) 11/06/2011   Pulmonary nodule 11/06/2011   Tobacco abuse 02/12/2011   Tobacco abuse counseling 02/12/2011   CAD, NATIVE VESSEL 10/18/2008   COLONIC POLYPS 08/05/2008   Hyperlipidemia 08/05/2008   Essential hypertension 08/05/2008   GERD 08/05/2008    Orientation RESPIRATION BLADDER Height & Weight     Self, Time, Situation, Place  Normal Continent Weight: 167 lb 1.7 oz (75.8 kg) Height:  6' (182.9 cm)  BEHAVIORAL SYMPTOMS/MOOD NEUROLOGICAL BOWEL NUTRITION STATUS      Continent Diet (see dc summary)  AMBULATORY STATUS COMMUNICATION OF NEEDS Skin   Limited Assist Verbally PU Stage and Appropriate Care, Surgical wounds (Stage II on coccyx; closed incision on coccyx)                       Personal Care Assistance Level of Assistance  Bathing, Dressing, Feeding Bathing Assistance: Maximum assistance Feeding assistance: Limited assistance Dressing Assistance: Limited assistance     Functional Limitations Info             SPECIAL CARE FACTORS FREQUENCY  PT (By licensed PT), OT (By licensed OT)     PT Frequency: 5x/week OT Frequency: 5x/week            Contractures Contractures Info: Not present    Additional Factors Info  Code Status, Allergies Code Status Info: DNR Allergies Info: Lyrica (Pregabalin), Prednisone           Current Medications (03/22/2021):  This is the current hospital active medication list Current Facility-Administered  Medications  Medication Dose Route Frequency Provider Last Rate Last Admin   acetaminophen (TYLENOL) tablet 1,000 mg  1,000 mg Oral Q6H Saverio Danker, PA-C   1,000 mg at 03/22/21 0443   albuterol (PROVENTIL) (2.5 MG/3ML) 0.083% nebulizer solution 3 mL  3 mL Inhalation Q6H PRN Karmen Bongo, MD   3 mL at 03/20/21 0629   atorvastatin (LIPITOR) tablet 80 mg  80 mg Oral Daily Karmen Bongo,  MD   80 mg at 03/22/21 0913   carvedilol (COREG) tablet 3.125 mg  3.125 mg Oral BID WC Karmen Bongo, MD   3.125 mg at 03/22/21 9735   cyclobenzaprine (FLEXERIL) tablet 5 mg  5 mg Oral Ivery Quale, MD   5 mg at 03/21/21 2143   enoxaparin (LOVENOX) injection 40 mg  40 mg Subcutaneous Q24H Saverio Danker, PA-C   40 mg at 03/22/21 3299   feeding supplement (ENSURE ENLIVE / ENSURE PLUS) liquid 237 mL  237 mL Oral TID BM Cristescu, Mircea G, MD   237 mL at 03/22/21 0911   hydrALAZINE (APRESOLINE) injection 10 mg  10 mg Intravenous Q2H PRN Karmen Bongo, MD       mometasone-formoterol [facility]) 200-5 MCG/ACT inhaler 2 puff  2 puff Inhalation BID Karmen Bongo, MD   2 puff at 03/22/21 2426   morphine 2 MG/ML injection 2 mg  2 mg Intravenous Q4H PRN Saverio Danker, PA-C       multivitamin with minerals tablet 1 tablet  1 tablet Oral Daily Cristescu, Linard Millers, MD   1 tablet at 03/22/21 0913   nicotine (NICODERM CQ - dosed in mg/24 hours) patch 21 mg  21 mg Transdermal Daily Karmen Bongo, MD   21 mg at 03/19/21 0823   ondansetron (ZOFRAN-ODT) disintegrating tablet 4 mg  4 mg Oral Q6H PRN Karmen Bongo, MD       Or   ondansetron [facility]) injection 4 mg  4 mg Intravenous Q6H PRN Karmen Bongo, MD   4 mg at 03/22/21 0443   oxyCODONE (Oxy IR/ROXICODONE) immediate release tablet 5-10 mg  5-10 mg Oral Q4H PRN Karmen Bongo, MD   5 mg at 03/22/21 0914   pantoprazole (PROTONIX) EC tablet 40 mg  40 mg Oral Daily Karmen Bongo, MD   40 mg at 03/22/21 0913   polyethylene glycol (MIRALAX / GLYCOLAX) packet 17 g  17 g Oral Daily Saverio Danker, PA-C   17 g at 03/22/21 8341   sacubitril-valsartan (ENTRESTO) 97-103 mg per tablet  1 tablet Oral BID Karmen Bongo, MD   1 tablet at 03/22/21 0912   valproic acid (DEPAKENE) 250 MG capsule 250 mg  250 mg Oral BID Karmen Bongo, MD   250 mg at 03/22/21 9622     Discharge Medications: Please see discharge summary for a list of discharge  medications.  Relevant Imaging Results:  Relevant Lab Results:   Additional Information SSN: [place], [place]

## 2021-03-22 NOTE — Progress Notes (Signed)
4 Days Post-Op  Subjective: Fell last night.  Some pain in her head, but controlled.  Some pain in his left hip and across his sacrum.  +flatus/BM (none documented but he says he did).  Tolerating soft diet.  ROS: See above, otherwise other systems negative  Objective: Vital signs in last 24 hours: Temp:  [97.5 F (36.4 C)-98.4 F (36.9 C)] 98.2 F (36.8 C) (12/06 0919) Pulse Rate:  [60-86] 79 (12/06 0919) Resp:  [16-20] 16 (12/06 0919) BP: (115-154)/(75-90) 115/86 (12/06 0919) SpO2:  [92 %-95 %] 94 % (12/06 0919) Last BM Date: 03/19/21  Intake/Output from previous day: 12/05 0701 - 12/06 0700 In: 300 [P.O.:300] Out: 400 [Urine:400] Intake/Output this shift: No intake/output data recorded.  PE: Abd: soft, appropriately tender, few BS, ND, incision midline and L groin stable with staples in place, c/d/I Ext: some pain over L hip and across sacrum, able to move his left leg some.  Lab Results:  Recent Labs    03/21/21 0442 03/22/21 0340  WBC 10.9* 9.2  HGB 12.3* 12.7*  HCT 36.6* 37.8*  PLT 124* 143*   BMET Recent Labs    03/21/21 0442 03/22/21 0340  NA 133* 133*  K 3.7 3.3*  CL 102 98  CO2 25 27  GLUCOSE 126* 117*  BUN 14 14  CREATININE 0.85 0.93  CALCIUM 8.0* 8.1*   PT/INR No results for input(s): LABPROT, INR in the last 72 hours. CMP     Component Value Date/Time   NA 133 (L) 03/22/2021 0340   NA 145 04/23/2011 0252   K 3.3 (L) 03/22/2021 0340   K 3.9 04/23/2011 0252   CL 98 03/22/2021 0340   CL 107 04/23/2011 0252   CO2 27 03/22/2021 0340   CO2 31 04/23/2011 0252   GLUCOSE 117 (H) 03/22/2021 0340   GLUCOSE 93 04/23/2011 0252   BUN 14 03/22/2021 0340   BUN 16 04/23/2011 0252   CREATININE 0.93 03/22/2021 0340   CREATININE 1.12 10/03/2011 1625   CALCIUM 8.1 (L) 03/22/2021 0340   CALCIUM 8.6 04/23/2011 0252   PROT 6.4 (L) 03/18/2021 0840   PROT 6.6 03/04/2013 0831   PROT 6.2 (L) 04/21/2011 1137   ALBUMIN 3.5 03/18/2021 0840   ALBUMIN  4.1 03/04/2013 0831   ALBUMIN 3.2 (L) 04/23/2011 0252   AST 18 03/18/2021 0840   AST 33 04/21/2011 1137   ALT 15 03/18/2021 0840   ALT 22 04/21/2011 1137   ALKPHOS 78 03/18/2021 0840   ALKPHOS 111 04/21/2011 1137   BILITOT 1.3 (H) 03/18/2021 0840   BILITOT 1.0 04/21/2011 1137   GFRNONAA >60 03/22/2021 0340   GFRNONAA 68 10/03/2011 1625   GFRAA >60 04/29/2018 1215   GFRAA 78 10/03/2011 1625   Lipase     Component Value Date/Time   LIPASE 22 03/18/2021 0840       Studies/Results: DG Ribs Unilateral W/Chest Right  Result Date: 03/22/2021 CLINICAL DATA:  Fall, back pain EXAM: RIGHT RIBS AND CHEST - 3+ VIEW COMPARISON:  None FINDINGS: No fracture or other bone lesions are seen involving the ribs. There is no evidence of pneumothorax or pleural effusion. Both lungs are clear. Heart size and mediastinal contours are within normal limits. IMPRESSION: Negative. Electronically Signed   By: Fidela Salisbury M.D.   On: 03/22/2021 01:58   DG Lumbar Spine 2-3 Views  Result Date: 03/22/2021 CLINICAL DATA:  Fall, back pain EXAM: LUMBAR SPINE - 2-3 VIEW COMPARISON:  None. FINDINGS: A 10 cm  wire like density overlies the soft tissues posterior to L1-3 and may represent an object overlying the patient or a retained radiopaque foreign body. L3-L5 lumbar fusion with instrumentation has been performed. No acute fracture or listhesis of the lumbar spine. Vertebral body height is preserved. There is intervertebral disc space narrowing and endplate remodeling of Y65-L9 in keeping with changes of mild to moderate degenerative disc disease. Aortoiliac stent graft is in place. IMPRESSION: No acute fracture or listhesis of the lumbar spine. Wire like density overlying the left paraspinal soft tissues. Clinical correlation for a penetrating retained foreign body is recommended. Electronically Signed   By: Fidela Salisbury M.D.   On: 03/22/2021 01:57   CT HEAD WO CONTRAST (5MM)  Result Date: 03/22/2021 CLINICAL  DATA:  Fall EXAM: CT HEAD WITHOUT CONTRAST TECHNIQUE: Contiguous axial images were obtained from the base of the skull through the vertex without intravenous contrast. COMPARISON:  03/18/2021 FINDINGS: Brain: No acute hemorrhage. Small right convexity chronic subdural hematoma. Unchanged leftward midline shift of approximately 3 mm. Resolving anterior right temporal intraparenchymal hematoma. Vascular: Bilateral carotid atherosclerosis. Skull: Normal. Negative for fracture or focal lesion. Sinuses/Orbits: No acute finding. Other: None. IMPRESSION: 1. No new acute abnormality. 2. Resolving anterior right temporal intraparenchymal hematoma. 3. Small right convexity chronic subdural hematoma with unchanged 3 mm leftward midline shift. Electronically Signed   By: Ulyses Jarred M.D.   On: 03/22/2021 01:13    Anti-infectives: Anti-infectives (From admission, onward)    Start     Dose/Rate Route Frequency Ordered Stop   03/19/21 0600  cefoTEtan (CEFOTAN) 1 g in sodium chloride 0.9 % 100 mL IVPB        1 g 200 mL/hr over 30 Minutes Intravenous On call to O.R. 03/18/21 1412 03/19/21 0706   03/18/21 1516  sodium chloride 0.9 % with cefoTEtan (CEFOTAN) ADS Med       Note to Pharmacy: Renda Rolls L: cabinet override      03/18/21 1516 03/19/21 0329        Assessment/Plan POD 4, s/p open LIH repair with phasix mesh, ex lap with SBR and primary UHR, Dr. Bobbye Morton 12/2 -patient needs to mobilize -pulm toilet -cont soft diet -having bowel function -from a surgical standpoint, he would be safe for discharge when bed available at SNF -follow up for staple removal and MD follow up arranged and in chart  FEN - soft VTE - Lovenox ID - none currently needed  COPD CAD HTN Prostate cancer PVD CVA Fall - will order pelvic film secondary to L hip and back pain.  CT head negative.  D/w primary service  LOS: 4 days    [name] , [facility] 03/22/2021, 10:18 AM Please see  Amion for pager number during day hours 7:00am-4:30pm or 7:00am -11:30am on weekends

## 2021-03-22 NOTE — Progress Notes (Addendum)
PROGRESS NOTE    [name]  HYQ:[id number] DOB: Sep 16, 1945 DOA: 03/18/2021 PCP: Rusty Aus, MD   Brief Narrative:  [name] is a 75 y.o. male with medical history significant of AAA; COPD; CAD; chronic systolic CHF; PAD; HTN; HLD; and prostate CA presenting with n/v.   He was previously admitted to the neurosurgery service from 11/7-12 with SDH and was discharged to [facility] and remained there through 11/30.  He reports that he progressed well while there and had no apparent issues.  However, he arrived home on 11/30 and started vomiting shortly thereafter with any attempt at food/drink intake.   He has noticed a bulge in his left groin.  He was found to have an incarcerated hernia and is going to the OR now.   Of note, his wife has had a complicated hospitalization following hip fracture.  He reports that she was discharged today, after 5 weeks of hospitalization.      Incarcerated inguinal hernia, started vomiting yesterday.  Fall with LOC, intracranial hemorrhage, d/c from CIR on 11/30. Procedure: open left recurrent, strangulated inguinal hernia repair with phasix ST absorbable mesh, exploratory laparotomy, small bowel resection with primary anastomosis, primary repair of umbilical hernia   Pre-op diagnosis: incarcerated left inguinal hernia, reducible umbilical hernia Post-op diagnosis: strangulated left inguinal hernia, incarcerated umbilical hernia, 4cm  Assessment & Plan:   Principal Problem:   Incarcerated inguinal hernia Active Problems:   Hyperlipidemia   Essential hypertension   Tobacco abuse   AAA (abdominal aortic aneurysm) without rupture   Centrilobular emphysema (HCC)   Chronic combined systolic and diastolic CHF (congestive heart failure) (HCC)   Intraparenchymal hemorrhage of brain ([place])  Incarcerated inguinal hernia with small bowel obstruction: S/p surgical repair 03/18/2021.  Tolerating soft diet, passing flatus.  No bowel movement charted but according  to surgery note, patient mentioned to them that he did have a bowel movement.  When I saw this patient this morning, he was too confused to provide that information to me.  Per surgery, he is cleared for discharge.  Chronic combined CHF: Echo March 2022 shows EF of 30 to 35% with grade 1 diastolic dysfunction.  He appears to be well compensated.  Continue Entresto.  Holding Aldactone.  Acute thrombocytopenia: Likely due to acute illness.  Improving.  No signs of bleeding.  Essential hypertension: Controlled, continue Coreg and Entresto.  Hyperlipidemia: Continue Lipitor.  Hypokalemia: We will replace.  COPD: Not in exacerbation.  Continue Symbicort and albuterol.  PVD: -AAA, 3.8 cm -s/p aortobi-iliac stent graft, patent -Severe atherosclerotic disease on CT  Fall in the hospital: Per reports, patient had unwitnessed fall late night of 03/21/2021.  Reportedly, he did hit his head on the floor but he did not have any complaints.  He was assessed by night hospitalist.  CT head, lumbar spine as well as rib x-ray was done and no acute fracture was found.  He did not have any complaints when I saw him today but when surgery saw him, he was complaining of left hip pain, they obtained a pelvic ultrasound which ruled out hip fracture or pelvic fracture.  Delirium: Patient appeared to be delirious this morning when I saw him, he was trying to grab things that were not there.  He was slightly sleepy and partly oriented.  Per nurse who was at the bedside, patient had not had good sleep last night.  I will start him on delirium precautions as well as Seroquel tonight.  Placement: PT  OT had recommended SNF however per [facility], wife wanted him to come home since they have significant help at home.  I personally do not think that is a good idea.  I believe patient will be better served at [facility].  If he were to go home, there is high risk of him returning back to the ED due to deconditioning.  It appears that family  is now reconsidering their initial decision.  Nonetheless, he is not ready for discharge today.  DVT prophylaxis: enoxaparin (LOVENOX) injection 40 mg Start: 03/21/21 1015 SCDs Start: 03/18/21 1508   Code Status: DNR  Family Communication:  None present at bedside.    Status is: Inpatient  Remains inpatient appropriate because: Still recovering from small bowel obstruction and now delirious.  Estimated body mass index is 22.66 kg/m as calculated from the following:   Height as of this encounter: 6' (1.829 m).   Weight as of this encounter: 75.8 kg.  Pressure Injury 02/26/21 Coccyx Mid;Posterior Stage 2 -  Partial thickness loss of dermis presenting as a shallow open injury with a red, pink wound bed without slough. (Active)  02/26/21 1702  Location: Coccyx  Location Orientation: Mid;Posterior  Staging: Stage 2 -  Partial thickness loss of dermis presenting as a shallow open injury with a red, pink wound bed without slough.  Wound Description (Comments):   Present on Admission: Yes    Nutritional Assessment: Body mass index is 22.66 kg/m.[name] Seen by dietician.  I agree with the assessment and plan as outlined below: Nutrition Status: Nutrition Problem: Increased nutrient needs Etiology: post-op healing Signs/Symptoms: estimated needs    .  Skin Assessment: I have examined the patient's skin and I agree with the wound assessment as performed by the wound care RN as outlined below: Pressure Injury 02/26/21 Coccyx Mid;Posterior Stage 2 -  Partial thickness loss of dermis presenting as a shallow open injury with a red, pink wound bed without slough. (Active)  02/26/21 1702  Location: Coccyx  Location Orientation: Mid;Posterior  Staging: Stage 2 -  Partial thickness loss of dermis presenting as a shallow open injury with a red, pink wound bed without slough.  Wound Description (Comments):   Present on Admission: Yes    Consultants:  General surgery  Procedures:  As  above  Antimicrobials:  Anti-infectives (From admission, onward)    Start     Dose/Rate Route Frequency Ordered Stop   03/19/21 0600  cefoTEtan (CEFOTAN) 1 g in sodium chloride 0.9 % 100 mL IVPB        1 g 200 mL/hr over 30 Minutes Intravenous On call to O.R. 03/18/21 1412 03/19/21 0706   03/18/21 1516  sodium chloride 0.9 % with cefoTEtan (CEFOTAN) ADS Med       Note to Pharmacy: Renda Rolls L: cabinet override      03/18/21 1516 03/19/21 0329          Subjective:  Seen and examined.  Patient was slightly lethargic and confused.  No complaints. Objective: Vitals:   03/21/21 2341 03/22/21 0215 03/22/21 0456 03/22/21 0919  BP: (!) 154/86 136/90 121/90 115/86  Pulse: 85 60 86 79  Resp: 20 17 17 16   Temp: 98.4 F (36.9 C) 98 F (36.7 C) (!) 97.5 F (36.4 C) 98.2 F (36.8 C)  TempSrc:  Oral    SpO2: 95% 95% 95% 94%  Weight:      Height:        Intake/Output Summary (Last 24 hours) at 03/22/2021 1317 Last  data filed at 03/21/2021 2102 Gross per 24 hour  Intake 0 ml  Output 250 ml  Net -250 ml    Filed Weights   03/18/21 2057 03/20/21 2135  Weight: 73 kg 75.8 kg    Examination:  General exam: Appears calm and comfortable but confused Respiratory system: Clear to auscultation. Respiratory effort normal. Cardiovascular system: S1 & S2 heard, RRR. No JVD, murmurs, rubs, gallops or clicks. No pedal edema. Gastrointestinal system: Abdomen is nondistended, soft and nontender. No organomegaly or masses felt. Normal bowel sounds heard. Central nervous system: Slightly lethargic and partly oriented.  No focal deficit. Extremities: Symmetric 5 x 5 power.  Data Reviewed: I have personally reviewed following labs and imaging studies  CBC: Recent Labs  Lab 03/18/21 0840 03/19/21 0256 03/20/21 1502 03/21/21 0442 03/22/21 0340  WBC 13.7* 14.0* 10.8* 10.9* 9.2  NEUTROABS 12.3*  --   --   --  7.8*  HGB 16.9 14.7 12.4* 12.3* 12.7*  HCT 50.5 44.2 38.1* 36.6* 37.8*   MCV 95.5 97.4 97.2 95.8 95.7  PLT 227 152 121* 124* 143*    Basic Metabolic Panel: Recent Labs  Lab 03/18/21 0840 03/19/21 0256 03/20/21 1502 03/21/21 0442 03/22/21 0340  NA 140 138 135 133* 133*  K 4.6 4.7 4.1 3.7 3.3*  CL 104 102 99 102 98  CO2 27 27 27 25 27   GLUCOSE 150* 136* 95 126* 117*  BUN 23 25* 19 14 14   CREATININE 1.09 1.25* 0.91 0.85 0.93  CALCIUM 9.5 8.3* 8.1* 8.0* 8.1*    GFR: Estimated Creatinine Clearance: 71.3 mL/min (by C-G formula based on SCr of 0.93 mg/dL). Liver Function Tests: Recent Labs  Lab 03/18/21 0840  AST 18  ALT 15  ALKPHOS 78  BILITOT 1.3*  PROT 6.4*  ALBUMIN 3.5    Recent Labs  Lab 03/18/21 0840  LIPASE 22    No results for input(s): AMMONIA in the last 168 hours. Coagulation Profile: No results for input(s): INR, PROTIME in the last 168 hours. Cardiac Enzymes: No results for input(s): CKTOTAL, CKMB, CKMBINDEX, TROPONINI in the last 168 hours. BNP (last 3 results) No results for input(s): PROBNP in the last 8760 hours. HbA1C: No results for input(s): HGBA1C in the last 72 hours. CBG: Recent Labs  Lab 03/18/21 0841  GLUCAP 138*    Lipid Profile: No results for input(s): CHOL, HDL, LDLCALC, TRIG, CHOLHDL, LDLDIRECT in the last 72 hours. Thyroid Function Tests: No results for input(s): TSH, T4TOTAL, FREET4, T3FREE, THYROIDAB in the last 72 hours. Anemia Panel: No results for input(s): VITAMINB12, FOLATE, FERRITIN, TIBC, IRON, RETICCTPCT in the last 72 hours. Sepsis Labs: Recent Labs  Lab 03/18/21 0840  LATICACIDVEN 1.6     Recent Results (from the past 240 hour(s))  Resp Panel by RT-PCR (Flu A&B, Covid) Nasopharyngeal Swab     Status: None   Collection Time: 03/18/21  8:45 AM   Specimen: Nasopharyngeal Swab; Nasopharyngeal(NP) swabs in vial transport medium  Result Value Ref Range Status   SARS Coronavirus 2 by RT PCR NEGATIVE NEGATIVE Final    Comment: (NOTE) SARS-CoV-2 target nucleic acids are NOT  DETECTED.  The SARS-CoV-2 RNA is generally detectable in upper respiratory specimens during the acute phase of infection. The lowest concentration of SARS-CoV-2 viral copies this assay can detect is 138 copies/mL. A negative result does not preclude SARS-Cov-2 infection and should not be used as the sole basis for treatment or other patient management decisions. A negative result may occur with  improper specimen collection/handling, submission of specimen other than nasopharyngeal swab, presence of viral mutation(s) within the areas targeted by this assay, and inadequate number of viral copies(<138 copies/mL). A negative result must be combined with clinical observations, patient history, and epidemiological information. The expected result is Negative.  Fact Sheet for Patients:  EntrepreneurPulse.com.au  Fact Sheet for Healthcare Providers:  IncredibleEmployment.be  This test is no t yet approved or cleared by the [place] FDA and  has been authorized for detection and/or diagnosis of SARS-CoV-2 by FDA under an Emergency Use Authorization (EUA). This EUA will remain  in effect (meaning this test can be used) for the duration of the COVID-19 declaration under Section 564(b)(1) of the Act, 21 U.S.C.section 360bbb-3(b)(1), unless the authorization is terminated  or revoked sooner.       Influenza A by PCR NEGATIVE NEGATIVE Final   Influenza B by PCR NEGATIVE NEGATIVE Final    Comment: (NOTE) The Xpert Xpress SARS-CoV-2/FLU/RSV plus assay is intended as an aid in the diagnosis of influenza from Nasopharyngeal swab specimens and should not be used as a sole basis for treatment. Nasal washings and aspirates are unacceptable for Xpert Xpress SARS-CoV-2/FLU/RSV testing.  Fact Sheet for Patients: EntrepreneurPulse.com.au  Fact Sheet for Healthcare Providers: IncredibleEmployment.be  This test is not yet  approved or cleared by the [place] FDA and has been authorized for detection and/or diagnosis of SARS-CoV-2 by FDA under an Emergency Use Authorization (EUA). This EUA will remain in effect (meaning this test can be used) for the duration of the COVID-19 declaration under Section 564(b)(1) of the Act, 21 U.S.C. section 360bbb-3(b)(1), unless the authorization is terminated or revoked.  Performed at [facility], [place] [address] [place], [place] [address]        Radiology Studies: DG Ribs Unilateral W/Chest Right  Result Date: 03/22/2021 CLINICAL DATA:  Fall, back pain EXAM: RIGHT RIBS AND CHEST - 3+ VIEW COMPARISON:  None FINDINGS: No fracture or other bone lesions are seen involving the ribs. There is no evidence of pneumothorax or pleural effusion. Both lungs are clear. Heart size and mediastinal contours are within normal limits. IMPRESSION: Negative. Electronically Signed   By: Fidela Salisbury M.D.   On: 03/22/2021 01:58   DG Lumbar Spine 2-3 Views  Result Date: 03/22/2021 CLINICAL DATA:  Fall, back pain EXAM: LUMBAR SPINE - 2-3 VIEW COMPARISON:  None. FINDINGS: A 10 cm wire like density overlies the soft tissues posterior to L1-3 and may represent an object overlying the patient or a retained radiopaque foreign body. L3-L5 lumbar fusion with instrumentation has been performed. No acute fracture or listhesis of the lumbar spine. Vertebral body height is preserved. There is intervertebral disc space narrowing and endplate remodeling of J50-K9 in keeping with changes of mild to moderate degenerative disc disease. Aortoiliac stent graft is in place. IMPRESSION: No acute fracture or listhesis of the lumbar spine. Wire like density overlying the left paraspinal soft tissues. Clinical correlation for a penetrating retained foreign body is recommended. Electronically Signed   By: Fidela Salisbury M.D.   On: 03/22/2021 01:57   CT HEAD WO CONTRAST (5MM)  Result Date: 03/22/2021 CLINICAL  DATA:  Fall EXAM: CT HEAD WITHOUT CONTRAST TECHNIQUE: Contiguous axial images were obtained from the base of the skull through the vertex without intravenous contrast. COMPARISON:  03/18/2021 FINDINGS: Brain: No acute hemorrhage. Small right convexity chronic subdural hematoma. Unchanged leftward midline shift of approximately 3 mm. Resolving anterior right temporal intraparenchymal hematoma. Vascular: Bilateral  carotid atherosclerosis. Skull: Normal. Negative for fracture or focal lesion. Sinuses/Orbits: No acute finding. Other: None. IMPRESSION: 1. No new acute abnormality. 2. Resolving anterior right temporal intraparenchymal hematoma. 3. Small right convexity chronic subdural hematoma with unchanged 3 mm leftward midline shift. Electronically Signed   By: Ulyses Jarred M.D.   On: 03/22/2021 01:13   DG Pelvis Portable  Result Date: 03/22/2021 CLINICAL DATA:  Trauma, fall EXAM: PORTABLE PELVIS 1-2 VIEWS COMPARISON:  None. FINDINGS: No displaced fracture is seen. Joint spaces in both hips appear symmetrical. Arterial calcifications are seen in the soft tissues. There are metallic densities in the prostate suggesting previous brachytherapy. Skin staples are seen in the lower abdomen and left inguinal region suggesting recent surgery. There is surgical fusion in the lower lumbar spine. Vascular stents are noted in the course of common iliac arteries. IMPRESSION: No displaced fracture or dislocation is seen. Electronically Signed   By: Elmer Picker M.D.   On: 03/22/2021 11:01    Scheduled Meds:  acetaminophen  1,000 mg Oral Q6H   atorvastatin  80 mg Oral Daily   carvedilol  3.125 mg Oral BID WC   cyclobenzaprine  5 mg Oral QHS   enoxaparin (LOVENOX) injection  40 mg Subcutaneous Q24H   feeding supplement  237 mL Oral TID BM   mometasone-formoterol  2 puff Inhalation BID   multivitamin with minerals  1 tablet Oral Daily   nicotine  21 mg Transdermal Daily   pantoprazole  40 mg Oral Daily    polyethylene glycol  17 g Oral Daily   QUEtiapine  25 mg Oral QHS   sacubitril-valsartan  1 tablet Oral BID   valproic acid  250 mg Oral BID   Continuous Infusions:   LOS: 4 days   Time spent: 30-minute   Darliss Cheney, MD [facility]  03/22/2021, 1:17 PM  Please page via [place] and do not message via secure chat for anything urgent. Secure chat can be used for anything non urgent.  How to contact the [facility] Attending or Consulting provider [place] or covering provider during after hours [place], for this patient?  Check the care team in [facility] and look for a) attending/consulting TRH provider listed and b) the [facility] team listed. Page or secure chat [phone]. Log into [email or web address] and use [place]'s universal password to access. If you do not have the password, please contact the hospital operator. Locate the [facility] provider you are looking for under [facility] and page to a number that you can be directly reached. If you still have difficulty reaching the provider, please page the [facility] (Director on Call) for the Hospitalists listed on amion for assistance.

## 2021-03-22 NOTE — TOC Progression Note (Addendum)
Transition of Care [facility]) - Progression Note    Patient Details  Name: [name] MRN: [id number] Date of Birth: 05/12/1945  [facility] CM/SW [place], LCSW Phone Number: 03/22/2021, 12:44 PM  Clinical Narrative:    12:44pm-CSW received call from patient's son [name] [phone]). He is going to talk with his family about the plan and call CSW back. SNF workup completed just in case.   5pm-CSW provided further bed offer to son [name]. He will speak with family tonight. CSW contacted [employer] to start auth but they requested CSW call in the morning. Unit CSW to follow up tomorrow.    Expected Discharge Plan: [place] Barriers to Discharge: No Barriers Identified  Expected Discharge Plan and Services Expected Discharge Plan: [place] In-house Referral: Clinical Social Work Discharge Planning Services: CM Consult Post Acute Care Choice: [place] arrangements for the past 2 months: Single Family Home                                       Social Determinants of Health (SDOH) Interventions    Readmission Risk Interventions No flowsheet data found.

## 2021-03-23 LAB — CBC WITH DIFFERENTIAL/PLATELET
Abs Immature Granulocytes: 0.04 10*3/uL (ref 0.00–0.07)
Basophils Absolute: 0 10*3/uL (ref 0.0–0.1)
Basophils Relative: 0 %
Eosinophils Absolute: 0.3 10*3/uL (ref 0.0–0.5)
Eosinophils Relative: 4 %
HCT: 38.7 % — ABNORMAL LOW (ref 39.0–52.0)
Hemoglobin: 12.7 g/dL — ABNORMAL LOW (ref 13.0–17.0)
Immature Granulocytes: 1 %
Lymphocytes Relative: 7 %
Lymphs Abs: 0.5 10*3/uL — ABNORMAL LOW (ref 0.7–4.0)
MCH: 31.8 pg (ref 26.0–34.0)
MCHC: 32.8 g/dL (ref 30.0–36.0)
MCV: 97 fL (ref 80.0–100.0)
Monocytes Absolute: 0.6 10*3/uL (ref 0.1–1.0)
Monocytes Relative: 8 %
Neutro Abs: 5.6 10*3/uL (ref 1.7–7.7)
Neutrophils Relative %: 80 %
Platelets: 182 10*3/uL (ref 150–400)
RBC: 3.99 MIL/uL — ABNORMAL LOW (ref 4.22–5.81)
RDW: 12.7 % (ref 11.5–15.5)
WBC: 7 10*3/uL (ref 4.0–10.5)
nRBC: 0 % (ref 0.0–0.2)

## 2021-03-23 LAB — BASIC METABOLIC PANEL
Anion gap: 6 (ref 5–15)
BUN: 15 mg/dL (ref 8–23)
CO2: 30 mmol/L (ref 22–32)
Calcium: 7.9 mg/dL — ABNORMAL LOW (ref 8.9–10.3)
Chloride: 103 mmol/L (ref 98–111)
Creatinine, Ser: 0.9 mg/dL (ref 0.61–1.24)
GFR, Estimated: >60 mL/min (ref >60)
Glucose, Bld: 92 mg/dL (ref 70–99)
Potassium: 4 mmol/L (ref 3.5–5.1)
Sodium: 139 mmol/L (ref 135–145)

## 2021-03-23 LAB — MAGNESIUM: Magnesium: 1.8 mg/dL (ref 1.7–2.4)

## 2021-03-23 MED ORDER — OXYCODONE HCL 5 MG PO TABS: 5.0000 mg | ORAL_TABLET | ORAL | 0 refills | Status: DC | PRN

## 2021-03-23 MED ORDER — SPIRONOLACTONE 12.5 MG HALF TABLET
12.5000 mg | ORAL_TABLET | Freq: Every day | ORAL | Status: DC
  Administered 2021-03-23 – 2021-03-28 (×6): 12.5 mg via ORAL
  Filled 2021-03-23 (×6): qty 1

## 2021-03-23 MED ORDER — ACETAMINOPHEN 500 MG PO TABS: 1000.0000 mg | ORAL_TABLET | Freq: Four times a day (QID) | ORAL | 0 refills | Status: DC | PRN

## 2021-03-23 NOTE — Progress Notes (Signed)
5 Days Post-Op  Subjective: No new complaints today.  Has had 1 BM.  Tolerating soft diet.  ROS: See above, otherwise other systems negative  Objective: Vital signs in last 24 hours: Temp:  [97.9 F (36.6 C)-98.6 F (37 C)] 97.9 F (36.6 C) (12/07 0731) Pulse Rate:  [62-87] 84 (12/07 0748) Resp:  [16-18] 16 (12/07 0748) BP: (116-131)/(56-70) 125/65 (12/07 0731) SpO2:  [87 %-98 %] 98 % (12/07 0748) Last BM Date: 03/21/21  Intake/Output from previous day: 12/06 0701 - 12/07 0700 In: 1198.3 [P.O.:640; I.V.:558.3] Out: 300 [Urine:300] Intake/Output this shift: Total I/O In: 120 [P.O.:120] Out: -   PE: Abd: soft, appropriately tender, few BS, ND, incision midline and L groin stable with staples in place, c/d/I   Lab Results:  Recent Labs    03/21/21 0442 03/22/21 0340  WBC 10.9* 9.2  HGB 12.3* 12.7*  HCT 36.6* 37.8*  PLT 124* 143*   BMET Recent Labs    03/21/21 0442 03/22/21 0340  NA 133* 133*  K 3.7 3.3*  CL 102 98  CO2 25 27  GLUCOSE 126* 117*  BUN 14 14  CREATININE 0.85 0.93  CALCIUM 8.0* 8.1*   PT/INR No results for input(s): LABPROT, INR in the last 72 hours. CMP     Component Value Date/Time   NA 133 (L) 03/22/2021 0340   NA 145 04/23/2011 0252   K 3.3 (L) 03/22/2021 0340   K 3.9 04/23/2011 0252   CL 98 03/22/2021 0340   CL 107 04/23/2011 0252   CO2 27 03/22/2021 0340   CO2 31 04/23/2011 0252   GLUCOSE 117 (H) 03/22/2021 0340   GLUCOSE 93 04/23/2011 0252   BUN 14 03/22/2021 0340   BUN 16 04/23/2011 0252   CREATININE 0.93 03/22/2021 0340   CREATININE 1.12 10/03/2011 1625   CALCIUM 8.1 (L) 03/22/2021 0340   CALCIUM 8.6 04/23/2011 0252   PROT 6.4 (L) 03/18/2021 0840   PROT 6.6 03/04/2013 0831   PROT 6.2 (L) 04/21/2011 1137   ALBUMIN 3.5 03/18/2021 0840   ALBUMIN 4.1 03/04/2013 0831   ALBUMIN 3.2 (L) 04/23/2011 0252   AST 18 03/18/2021 0840   AST 33 04/21/2011 1137   ALT 15 03/18/2021 0840   ALT 22 04/21/2011 1137   ALKPHOS 78  03/18/2021 0840   ALKPHOS 111 04/21/2011 1137   BILITOT 1.3 (H) 03/18/2021 0840   BILITOT 1.0 04/21/2011 1137   GFRNONAA >60 03/22/2021 0340   GFRNONAA 68 10/03/2011 1625   GFRAA >60 04/29/2018 1215   GFRAA 78 10/03/2011 1625   Lipase     Component Value Date/Time   LIPASE 22 03/18/2021 0840       Studies/Results: DG Ribs Unilateral W/Chest Right  Result Date: 03/22/2021 CLINICAL DATA:  Fall, back pain EXAM: RIGHT RIBS AND CHEST - 3+ VIEW COMPARISON:  None FINDINGS: No fracture or other bone lesions are seen involving the ribs. There is no evidence of pneumothorax or pleural effusion. Both lungs are clear. Heart size and mediastinal contours are within normal limits. IMPRESSION: Negative. Electronically Signed   By: Fidela Salisbury M.D.   On: 03/22/2021 01:58   DG Lumbar Spine 2-3 Views  Result Date: 03/22/2021 CLINICAL DATA:  Fall, back pain EXAM: LUMBAR SPINE - 2-3 VIEW COMPARISON:  None. FINDINGS: A 10 cm wire like density overlies the soft tissues posterior to L1-3 and may represent an object overlying the patient or a retained radiopaque foreign body. L3-L5 lumbar fusion with instrumentation has been performed.  No acute fracture or listhesis of the lumbar spine. Vertebral body height is preserved. There is intervertebral disc space narrowing and endplate remodeling of U93-A3 in keeping with changes of mild to moderate degenerative disc disease. Aortoiliac stent graft is in place. IMPRESSION: No acute fracture or listhesis of the lumbar spine. Wire like density overlying the left paraspinal soft tissues. Clinical correlation for a penetrating retained foreign body is recommended. Electronically Signed   By: Fidela Salisbury M.D.   On: 03/22/2021 01:57   CT HEAD WO CONTRAST (5MM)  Result Date: 03/22/2021 CLINICAL DATA:  Fall EXAM: CT HEAD WITHOUT CONTRAST TECHNIQUE: Contiguous axial images were obtained from the base of the skull through the vertex without intravenous contrast.  COMPARISON:  03/18/2021 FINDINGS: Brain: No acute hemorrhage. Small right convexity chronic subdural hematoma. Unchanged leftward midline shift of approximately 3 mm. Resolving anterior right temporal intraparenchymal hematoma. Vascular: Bilateral carotid atherosclerosis. Skull: Normal. Negative for fracture or focal lesion. Sinuses/Orbits: No acute finding. Other: None. IMPRESSION: 1. No new acute abnormality. 2. Resolving anterior right temporal intraparenchymal hematoma. 3. Small right convexity chronic subdural hematoma with unchanged 3 mm leftward midline shift. Electronically Signed   By: Ulyses Jarred M.D.   On: 03/22/2021 01:13   DG Pelvis Portable  Result Date: 03/22/2021 CLINICAL DATA:  Trauma, fall EXAM: PORTABLE PELVIS 1-2 VIEWS COMPARISON:  None. FINDINGS: No displaced fracture is seen. Joint spaces in both hips appear symmetrical. Arterial calcifications are seen in the soft tissues. There are metallic densities in the prostate suggesting previous brachytherapy. Skin staples are seen in the lower abdomen and left inguinal region suggesting recent surgery. There is surgical fusion in the lower lumbar spine. Vascular stents are noted in the course of common iliac arteries. IMPRESSION: No displaced fracture or dislocation is seen. Electronically Signed   By: Elmer Picker M.D.   On: 03/22/2021 11:01    Anti-infectives: Anti-infectives (From admission, onward)    Start     Dose/Rate Route Frequency Ordered Stop   03/19/21 0600  cefoTEtan (CEFOTAN) 1 g in sodium chloride 0.9 % 100 mL IVPB        1 g 200 mL/hr over 30 Minutes Intravenous On call to O.R. 03/18/21 1412 03/19/21 0706   03/18/21 1516  sodium chloride 0.9 % with cefoTEtan (CEFOTAN) ADS Med       Note to Pharmacy: Renda Rolls L: cabinet override      03/18/21 1516 03/19/21 0329        Assessment/Plan POD 5, s/p open LIH repair with phasix mesh, ex lap with SBR and primary UHR, Dr. Bobbye Morton 12/2 -patient needs to  mobilize -pulm toilet -cont soft diet -having bowel function -from a surgical standpoint, he would be safe for discharge when bed available at SNF -follow up for staple removal and MD follow up arranged and in chart -we will sign off at this time.  Please call back if needed  FEN - soft VTE - Lovenox ID - none currently needed  COPD CAD HTN Prostate cancer PVD CVA Fall   LOS: 5 days    Henreitta Cea , [facility] 03/23/2021, 9:21 AM Please see Amion for pager number during day hours 7:00am-4:30pm or 7:00am -11:30am on weekends

## 2021-03-23 NOTE — Progress Notes (Signed)
Occupational Therapy Treatment Patient Details Name: [name] MRN: [id number] DOB: 09-05-1945 Today's Date: 03/23/2021   History of present illness 75 y/o male presenting w/ N/V due to incarcerated inguinal hernia 12/2 s/p OR same date. Pt with recent D/C 11/30 from CIR after SDH. PMHx: prostate CA, AAA, COPD, CAD, CVA, HTN, HLD, CHF   OT comments  Pt making progress with functional goals. Pt able to sit EOB min guard A for UB dressing min A, LB dressing to don socks mod A, grooming tasks set up/Sup. Pt stood to RW min A with cues for safety and transferred to toilet min A. Pt requested to go back to bed. Pt with confusion noted, stating that his car keys and inhaler were in the bed with him. OT will continue to follow acutely to maximize level of function and safety   Recommendations for follow up therapy are one component of a multi-disciplinary discharge planning process, led by the attending physician.  Recommendations may be updated based on patient status, additional functional criteria and insurance authorization.    Follow Up Recommendations  Skilled nursing-short term rehab (<3 hours/day)    Assistance Recommended at Discharge Frequent or constant Supervision/Assistance  Equipment Recommendations  None recommended by OT    Recommendations for Other Services      Precautions / Restrictions Precautions Precautions: Fall;Other (comment) Precaution Comments: watch O2 Restrictions Weight Bearing Restrictions: No       Mobility Bed Mobility Overal bed mobility: Needs Assistance Bed Mobility: Supine to Sit     Supine to sit: Min guard Sit to supine: Min assist   General bed mobility comments: assist to initiate    Transfers Overall transfer level: Needs assistance Equipment used: Rolling walker (2 wheels)   Sit to Stand: Min assist Stand pivot transfers: Min assist         General transfer comment: MinA to power up and steady     Balance Overall balance  assessment: Needs assistance Sitting-balance support: No upper extremity supported;Feet supported Sitting balance-Leahy Scale: Fair     Standing balance support: Bilateral upper extremity supported;During functional activity Standing balance-Leahy Scale: Poor Standing balance comment: RW for standing                           ADL either performed or assessed with clinical judgement   ADL Overall ADL's : Needs assistance/impaired     Grooming: Wash/dry hands;Wash/dry face;Set up;Supervision/safety;Sitting           Upper Body Dressing : Minimal assistance;Sitting   Lower Body Dressing: Moderate assistance;Sitting/lateral leans Lower Body Dressing Details (indicate cue type and reason): doffing/donning socks Toilet Transfer: Minimal assistance;Stand-pivot;Rolling walker (2 wheels)   Toileting- Clothing Manipulation and Hygiene: Sit to/from stand;Minimal assistance       Functional mobility during ADLs: Minimal assistance;Rolling walker (2 wheels) General ADL Comments: Pt presenting with decreased cognition and activity tolerance    Extremity/Trunk Assessment Upper Extremity Assessment Upper Extremity Assessment: Generalized weakness       Cervical / Trunk Assessment Cervical / Trunk Assessment: Normal    Vision Baseline Vision/History: 1 Wears glasses Patient Visual Report: No change from baseline     Perception     Praxis      Cognition Arousal/Alertness: Lethargic Behavior During Therapy: Flat affect Overall Cognitive Status: No family/caregiver present to determine baseline cognitive functioning Area of Impairment: Safety/judgement;Awareness;Problem solving  Following Commands: Follows one step commands inconsistently;Follows one step commands with increased time;Follows multi-step commands inconsistently;Follows multi-step commands with increased time Safety/Judgement: Decreased awareness of safety;Decreased  awareness of deficits Awareness: Intellectual Problem Solving: Slow processing;Requires verbal cues General Comments: Drowsy, difficult understanding speech and slow to respond. Pt reports seeing ahis car keys and inhaler in his bed          Exercises     Shoulder Instructions       General Comments      Pertinent Vitals/ Pain       Pain Assessment: Faces Faces Pain Scale: No hurt Breathing: normal Negative Vocalization: none Facial Expression: Relaxed, neutral Pain Intervention(s): Monitored during session  Home Living                                          Prior Functioning/Environment              Frequency  Min 2X/week        Progress Toward Goals  OT Goals(current goals can now be found in the care plan section)  Progress towards OT goals: Progressing toward goals     Plan Discharge plan remains appropriate;Frequency remains appropriate    Co-evaluation                 AM-PAC OT "6 Clicks" Daily Activity     Outcome Measure   Help from another person eating meals?: None Help from another person taking care of personal grooming?: A Little Help from another person toileting, which includes using toliet, bedpan, or urinal?: A Little Help from another person bathing (including washing, rinsing, drying)?: A Lot Help from another person to put on and taking off regular upper body clothing?: A Little Help from another person to put on and taking off regular lower body clothing?: A Lot 6 Click Score: 17    End of Session Equipment Utilized During Treatment: Gait belt;Rolling walker (2 wheels)  OT Visit Diagnosis: Unsteadiness on feet (R26.81);Muscle weakness (generalized) (M62.81)   Activity Tolerance Patient tolerated treatment well   Patient Left in bed;with call bell/phone within reach;with bed alarm set   Nurse Communication          Time: 2637-8588 OT Time Calculation (min): 25 min  Charges: OT General  Charges $OT Visit: 1 Visit OT Treatments $Self Care/Home Management : 8-22 mins $Therapeutic Activity: 8-22 mins   Britt Bottom 03/23/2021, 2:36 PM

## 2021-03-23 NOTE — Progress Notes (Addendum)
Physical Therapy Treatment Patient Details Name: [name] MRN: [id number] DOB: 07-20-1945 Today's Date: 03/23/2021   History of Present Illness 75 y/o male presenting w/ N/V due to incarcerated inguinal hernia 12/2 s/p OR same date. Pt with recent D/C 11/30 from CIR after SDH. PMHx: prostate CA, AAA, COPD, CAD, CVA, HTN, HLD, CHF    PT Comments    Pt progressing slowly towards his physical therapy goals. Desat to 88% on RA at rest; returned to 2L O2 where he reached 92% with increased time. Pt ambulating 30 feet with a walker and min assist. Demonstrates decreased cognition, balance deficits, weakness, and decreased activity tolerance. Will continue efforts.     Recommendations for follow up therapy are one component of a multi-disciplinary discharge planning process, led by the attending physician.  Recommendations may be updated based on patient status, additional functional criteria and insurance authorization.  Follow Up Recommendations  Skilled nursing-short term rehab (<3 hours/day)     Assistance Recommended at Discharge Frequent or constant Supervision/Assistance  Equipment Recommendations  BSC/3in1    Recommendations for Other Services       Precautions / Restrictions Precautions Precautions: Fall;Other (comment) Precaution Comments: watch O2 Restrictions Weight Bearing Restrictions: No     Mobility  Bed Mobility Overal bed mobility: Needs Assistance Bed Mobility: Supine to Sit     Supine to sit: Min assist     General bed mobility comments: assist to initiate    Transfers Overall transfer level: Needs assistance Equipment used: Rolling walker (2 wheels)   Sit to Stand: Min assist           General transfer comment: MinA to power up and steady    Ambulation/Gait Ambulation/Gait assistance: Min assist Gait Distance (Feet): 30 Feet Assistive device: Rolling walker (2 wheels) Gait Pattern/deviations: Step-through pattern;Decreased stride  length;Trunk flexed Gait velocity: decreased Gait velocity interpretation: <1.8 ft/sec, indicate of risk for recurrent falls   General Gait Details: Consistent minA with steering walker and negoitating obstacles. Mod cues for stepping initiation, larger step lengths   Stairs             Wheelchair Mobility    Modified Rankin (Stroke Patients Only)       Balance Overall balance assessment: Needs assistance Sitting-balance support: No upper extremity supported;Feet supported Sitting balance-Leahy Scale: Fair     Standing balance support: Bilateral upper extremity supported;During functional activity Standing balance-Leahy Scale: Poor Standing balance comment: RW for standing                            Cognition Arousal/Alertness: Lethargic Behavior During Therapy: Flat affect Overall Cognitive Status: No family/caregiver present to determine baseline cognitive functioning Area of Impairment: Safety/judgement;Awareness;Problem solving                       Following Commands: Follows one step commands inconsistently;Follows one step commands with increased time;Follows multi-step commands inconsistently;Follows multi-step commands with increased time Safety/Judgement: Decreased awareness of safety;Decreased awareness of deficits Awareness: Intellectual Problem Solving: Slow processing;Requires verbal cues General Comments: Drowsy, difficult understanding speech and slow to respond. Pt reports seeing a bird in the room (RN notified).        Exercises      General Comments        Pertinent Vitals/Pain Pain Assessment: Faces Faces Pain Scale: No hurt    Home Living  Prior Function            PT Goals (current goals can now be found in the care plan section) Acute Rehab PT Goals Patient Stated Goal: did not state Potential to Achieve Goals: Fair Progress towards PT goals: Progressing toward goals     Frequency    Min 3X/week      PT Plan Current plan remains appropriate    Co-evaluation              AM-PAC PT "6 Clicks" Mobility   Outcome Measure  Help needed turning from your back to your side while in a flat bed without using bedrails?: A Little Help needed moving from lying on your back to sitting on the side of a flat bed without using bedrails?: A Little Help needed moving to and from a bed to a chair (including a wheelchair)?: A Little Help needed standing up from a chair using your arms (e.g., wheelchair or bedside chair)?: A Little Help needed to walk in hospital room?: A Lot Help needed climbing 3-5 steps with a railing? : Total 6 Click Score: 15    End of Session Equipment Utilized During Treatment: Gait belt;Oxygen Activity Tolerance: Patient tolerated treatment well Patient left: in chair;with call bell/phone within reach;with chair alarm set Nurse Communication: Mobility status PT Visit Diagnosis: Muscle weakness (generalized) (M62.81);History of falling (Z91.81);Other abnormalities of gait and mobility (R26.89);Difficulty in walking, not elsewhere classified (R26.2)     Time: 1660-6004 PT Time Calculation (min) (ACUTE ONLY): 23 min  Charges:  $Gait Training: 8-22 mins $Therapeutic Activity: 8-22 mins                     Wyona Almas, PT, DPT Acute Rehabilitation Services Pager [phone] Office [phone]     Deno Etienne 03/23/2021, 2:18 PM

## 2021-03-23 NOTE — Progress Notes (Signed)
PROGRESS NOTE    [name]  IRC:[id number] DOB: December 07, 1945 DOA: 03/18/2021 PCP: Rusty Aus, MD   Brief Narrative:  [name] is a 75 y.o. male with medical history significant of AAA; COPD; CAD; chronic systolic CHF; PAD; HTN; HLD; and prostate CA presenting with n/v.   He was previously admitted to the neurosurgery service from 11/7-12 with SDH and was discharged to [facility] and remained there through 11/30.  He reports that he progressed well while there and had no apparent issues.  However, he arrived home on 11/30 and started vomiting shortly thereafter with any attempt at food/drink intake.   He has noticed a bulge in his left groin.  He was found to have an incarcerated hernia and is going to the OR now.   Of note, his wife has had a complicated hospitalization following hip fracture.  He reports that she was discharged today, after 5 weeks of hospitalization.      Incarcerated inguinal hernia, started vomiting yesterday.  Fall with LOC, intracranial hemorrhage, d/c from CIR on 11/30. Procedure: open left recurrent, strangulated inguinal hernia repair with phasix ST absorbable mesh, exploratory laparotomy, small bowel resection with primary anastomosis, primary repair of umbilical hernia   Pre-op diagnosis: incarcerated left inguinal hernia, reducible umbilical hernia Post-op diagnosis: strangulated left inguinal hernia, incarcerated umbilical hernia, 4cm  Assessment & Plan:   Principal Problem:   Incarcerated inguinal hernia Active Problems:   Hyperlipidemia   Essential hypertension   Tobacco abuse   AAA (abdominal aortic aneurysm) without rupture   Centrilobular emphysema (HCC)   Chronic combined systolic and diastolic CHF (congestive heart failure) (HCC)   Intraparenchymal hemorrhage of brain ([place])  Incarcerated inguinal hernia with small bowel obstruction: S/p surgical repair 03/18/2021.  Tolerating soft diet, passing flatus.  He tells me that he has had a bowel  movement as well but that is not charted.  General surgery has cleared him for discharge.  Chronic combined CHF: Echo March 2022 shows EF of 30 to 35% with grade 1 diastolic dysfunction.  He appears to be well compensated.  Continue Entresto.  Resume Aldactone.  Acute thrombocytopenia: Likely due to acute illness.  Resolved.  Essential hypertension: Controlled, continue Coreg and Entresto.  Hyperlipidemia: Continue Lipitor.  Hypokalemia: Resolved.  COPD: Not in exacerbation.  Continue Symbicort and albuterol.  PVD: -AAA, 3.8 cm -s/p aortobi-iliac stent graft, patent -Severe atherosclerotic disease on CT  Fall in the hospital: Per reports, patient had unwitnessed fall late night of 03/21/2021.  Reportedly, he did hit his head on the floor but he did not have any complaints.  He was assessed by night hospitalist.  CT head, lumbar spine as well as rib x-ray was done and no acute fracture was found.  He did not have any complaints when I saw him today but when surgery saw him, he was complaining of left hip pain, they obtained a pelvic ultrasound which ruled out hip fracture or pelvic fracture.  Delirium: Patient better today, more alert today and oriented.  Continue Seroquel with delirium precautions.  1. Avoid benzodiazepines, antihistamines, anticholinergics, and minimize opiate use as these may worsen delirium. 2: Assess, prevent and manage pain as lack of treatment can result in delirium.  3: Provide appropriate lighting and clear signage; a clock and calendar should be easily visible to the patient. 4: Monitor environmental factors. Reduce light and noise at night (close shades, turn off lights, turn off TV, ect). Correct any alterations in sleep cycle. 5: Reorient  the patient to person, place, time and situation on each encounter.  6: Correct sensory deficits if possible (replace eye glasses, hearing aids, ect). 7: Avoid restraints if able. Severely delirious patients benefit from  constant observation by a sitter.  Placement: PT OT recommends SNF.  It appears that finally family has agreed for SNF, they have been provided the choices but have not made the decision yet.  Awaiting placement.  TOC on board.  DVT prophylaxis: enoxaparin (LOVENOX) injection 40 mg Start: 03/21/21 1015 SCDs Start: 03/18/21 1508   Code Status: DNR  Family Communication:  None present at bedside.    Status is: Inpatient  Remains inpatient appropriate because: Still recovering from small bowel obstruction and now delirious.  Estimated body mass index is 22.66 kg/m as calculated from the following:   Height as of this encounter: 6' (1.829 m).   Weight as of this encounter: 75.8 kg.  Pressure Injury 02/26/21 Coccyx Mid;Posterior Stage 2 -  Partial thickness loss of dermis presenting as a shallow open injury with a red, pink wound bed without slough. (Active)  02/26/21 1702  Location: Coccyx  Location Orientation: Mid;Posterior  Staging: Stage 2 -  Partial thickness loss of dermis presenting as a shallow open injury with a red, pink wound bed without slough.  Wound Description (Comments):   Present on Admission: Yes    Nutritional Assessment: Body mass index is 22.66 kg/m.[name] Seen by dietician.  I agree with the assessment and plan as outlined below: Nutrition Status: Nutrition Problem: Increased nutrient needs Etiology: post-op healing Signs/Symptoms: estimated needs    .  Skin Assessment: I have examined the patient's skin and I agree with the wound assessment as performed by the wound care RN as outlined below: Pressure Injury 02/26/21 Coccyx Mid;Posterior Stage 2 -  Partial thickness loss of dermis presenting as a shallow open injury with a red, pink wound bed without slough. (Active)  02/26/21 1702  Location: Coccyx  Location Orientation: Mid;Posterior  Staging: Stage 2 -  Partial thickness loss of dermis presenting as a shallow open injury with a red, pink wound bed without  slough.  Wound Description (Comments):   Present on Admission: Yes    Consultants:  General surgery  Procedures:  As above  Antimicrobials:  Anti-infectives (From admission, onward)    Start     Dose/Rate Route Frequency Ordered Stop   03/19/21 0600  cefoTEtan (CEFOTAN) 1 g in sodium chloride 0.9 % 100 mL IVPB        1 g 200 mL/hr over 30 Minutes Intravenous On call to O.R. 03/18/21 1412 03/19/21 0706   03/18/21 1516  sodium chloride 0.9 % with cefoTEtan (CEFOTAN) ADS Med       Note to Pharmacy: Renda Rolls L: cabinet override      03/18/21 1516 03/19/21 0329          Subjective:  Seen and examined this morning.  He said that he was not feeling well and complained of some abdominal pain but no nausea and he stated that he is passing flatus and had a bowel movement as well.  No other complaint.  Objective: Vitals:   03/23/21 0502 03/23/21 0507 03/23/21 0731 03/23/21 0748  BP: (!) 116/56  125/65   Pulse: 66 62 87 84  Resp: 16  16 16   Temp: 98.3 F (36.8 C)  97.9 F (36.6 C)   TempSrc:   Oral   SpO2: (!) 87% 92% 91% 98%  Weight:  Height:        Intake/Output Summary (Last 24 hours) at 03/23/2021 1343 Last data filed at 03/23/2021 1014 Gross per 24 hour  Intake 1159.37 ml  Output 500 ml  Net 659.37 ml    Filed Weights   03/18/21 2057 03/20/21 2135  Weight: 73 kg 75.8 kg    Examination:  General exam: Appears calm and comfortable  Respiratory system: Clear to auscultation. Respiratory effort normal. Cardiovascular system: S1 & S2 heard, RRR. No JVD, murmurs, rubs, gallops or clicks. No pedal edema. Gastrointestinal system: Abdomen is nondistended, soft and mildly tender at incision site. No organomegaly or masses felt. Normal bowel sounds heard. Central nervous system: Alert and oriented. No focal neurological deficits. Extremities: Symmetric 5 x 5 power.  Data Reviewed: I have personally reviewed following labs and imaging studies  CBC: Recent  Labs  Lab 03/18/21 0840 03/19/21 0256 03/20/21 1502 03/21/21 0442 03/22/21 0340 03/23/21 0842  WBC 13.7* 14.0* 10.8* 10.9* 9.2 7.0  NEUTROABS 12.3*  --   --   --  7.8* 5.6  HGB 16.9 14.7 12.4* 12.3* 12.7* 12.7*  HCT 50.5 44.2 38.1* 36.6* 37.8* 38.7*  MCV 95.5 97.4 97.2 95.8 95.7 97.0  PLT 227 152 121* 124* 143* 622    Basic Metabolic Panel: Recent Labs  Lab 03/19/21 0256 03/20/21 1502 03/21/21 0442 03/22/21 0340 03/23/21 0842  NA 138 135 133* 133* 139  K 4.7 4.1 3.7 3.3* 4.0  CL 102 99 102 98 103  CO2 27 27 25 27 30   GLUCOSE 136* 95 126* 117* 92  BUN 25* 19 14 14 15   CREATININE 1.25* 0.91 0.85 0.93 0.90  CALCIUM 8.3* 8.1* 8.0* 8.1* 7.9*  MG  --   --   --   --  1.8    GFR: Estimated Creatinine Clearance: 73.7 mL/min (by C-G formula based on SCr of 0.9 mg/dL). Liver Function Tests: Recent Labs  Lab 03/18/21 0840  AST 18  ALT 15  ALKPHOS 78  BILITOT 1.3*  PROT 6.4*  ALBUMIN 3.5    Recent Labs  Lab 03/18/21 0840  LIPASE 22    No results for input(s): AMMONIA in the last 168 hours. Coagulation Profile: No results for input(s): INR, PROTIME in the last 168 hours. Cardiac Enzymes: No results for input(s): CKTOTAL, CKMB, CKMBINDEX, TROPONINI in the last 168 hours. BNP (last 3 results) No results for input(s): PROBNP in the last 8760 hours. HbA1C: No results for input(s): HGBA1C in the last 72 hours. CBG: Recent Labs  Lab 03/18/21 0841  GLUCAP 138*    Lipid Profile: No results for input(s): CHOL, HDL, LDLCALC, TRIG, CHOLHDL, LDLDIRECT in the last 72 hours. Thyroid Function Tests: No results for input(s): TSH, T4TOTAL, FREET4, T3FREE, THYROIDAB in the last 72 hours. Anemia Panel: No results for input(s): VITAMINB12, FOLATE, FERRITIN, TIBC, IRON, RETICCTPCT in the last 72 hours. Sepsis Labs: Recent Labs  Lab 03/18/21 0840  LATICACIDVEN 1.6     Recent Results (from the past 240 hour(s))  Resp Panel by RT-PCR (Flu A&B, Covid) Nasopharyngeal  Swab     Status: None   Collection Time: 03/18/21  8:45 AM   Specimen: Nasopharyngeal Swab; Nasopharyngeal(NP) swabs in vial transport medium  Result Value Ref Range Status   SARS Coronavirus 2 by RT PCR NEGATIVE NEGATIVE Final    Comment: (NOTE) SARS-CoV-2 target nucleic acids are NOT DETECTED.  The SARS-CoV-2 RNA is generally detectable in upper respiratory specimens during the acute phase of infection. The lowest concentration of  SARS-CoV-2 viral copies this assay can detect is 138 copies/mL. A negative result does not preclude SARS-Cov-2 infection and should not be used as the sole basis for treatment or other patient management decisions. A negative result may occur with  improper specimen collection/handling, submission of specimen other than nasopharyngeal swab, presence of viral mutation(s) within the areas targeted by this assay, and inadequate number of viral copies(<138 copies/mL). A negative result must be combined with clinical observations, patient history, and epidemiological information. The expected result is Negative.  Fact Sheet for Patients:  EntrepreneurPulse.com.au  Fact Sheet for Healthcare Providers:  IncredibleEmployment.be  This test is no t yet approved or cleared by the [place] FDA and  has been authorized for detection and/or diagnosis of SARS-CoV-2 by FDA under an Emergency Use Authorization (EUA). This EUA will remain  in effect (meaning this test can be used) for the duration of the COVID-19 declaration under Section 564(b)(1) of the Act, 21 U.S.C.section 360bbb-3(b)(1), unless the authorization is terminated  or revoked sooner.       Influenza A by PCR NEGATIVE NEGATIVE Final   Influenza B by PCR NEGATIVE NEGATIVE Final    Comment: (NOTE) The Xpert Xpress SARS-CoV-2/FLU/RSV plus assay is intended as an aid in the diagnosis of influenza from Nasopharyngeal swab specimens and should not be used as a sole  basis for treatment. Nasal washings and aspirates are unacceptable for Xpert Xpress SARS-CoV-2/FLU/RSV testing.  Fact Sheet for Patients: EntrepreneurPulse.com.au  Fact Sheet for Healthcare Providers: IncredibleEmployment.be  This test is not yet approved or cleared by the [place] FDA and has been authorized for detection and/or diagnosis of SARS-CoV-2 by FDA under an Emergency Use Authorization (EUA). This EUA will remain in effect (meaning this test can be used) for the duration of the COVID-19 declaration under Section 564(b)(1) of the Act, 21 U.S.C. section 360bbb-3(b)(1), unless the authorization is terminated or revoked.  Performed at [facility], [place] [address], [place], [place] [address]        Radiology Studies: DG Ribs Unilateral W/Chest Right  Result Date: 03/22/2021 CLINICAL DATA:  Fall, back pain EXAM: RIGHT RIBS AND CHEST - 3+ VIEW COMPARISON:  None FINDINGS: No fracture or other bone lesions are seen involving the ribs. There is no evidence of pneumothorax or pleural effusion. Both lungs are clear. Heart size and mediastinal contours are within normal limits. IMPRESSION: Negative. Electronically Signed   By: Fidela Salisbury M.D.   On: 03/22/2021 01:58   DG Lumbar Spine 2-3 Views  Result Date: 03/22/2021 CLINICAL DATA:  Fall, back pain EXAM: LUMBAR SPINE - 2-3 VIEW COMPARISON:  None. FINDINGS: A 10 cm wire like density overlies the soft tissues posterior to L1-3 and may represent an object overlying the patient or a retained radiopaque foreign body. L3-L5 lumbar fusion with instrumentation has been performed. No acute fracture or listhesis of the lumbar spine. Vertebral body height is preserved. There is intervertebral disc space narrowing and endplate remodeling of K59-D3 in keeping with changes of mild to moderate degenerative disc disease. Aortoiliac stent graft is in place. IMPRESSION: No acute fracture or listhesis of  the lumbar spine. Wire like density overlying the left paraspinal soft tissues. Clinical correlation for a penetrating retained foreign body is recommended. Electronically Signed   By: Fidela Salisbury M.D.   On: 03/22/2021 01:57   CT HEAD WO CONTRAST (5MM)  Result Date: 03/22/2021 CLINICAL DATA:  Fall EXAM: CT HEAD WITHOUT CONTRAST TECHNIQUE: Contiguous axial images were obtained from  the base of the skull through the vertex without intravenous contrast. COMPARISON:  03/18/2021 FINDINGS: Brain: No acute hemorrhage. Small right convexity chronic subdural hematoma. Unchanged leftward midline shift of approximately 3 mm. Resolving anterior right temporal intraparenchymal hematoma. Vascular: Bilateral carotid atherosclerosis. Skull: Normal. Negative for fracture or focal lesion. Sinuses/Orbits: No acute finding. Other: None. IMPRESSION: 1. No new acute abnormality. 2. Resolving anterior right temporal intraparenchymal hematoma. 3. Small right convexity chronic subdural hematoma with unchanged 3 mm leftward midline shift. Electronically Signed   By: Ulyses Jarred M.D.   On: 03/22/2021 01:13   DG Pelvis Portable  Result Date: 03/22/2021 CLINICAL DATA:  Trauma, fall EXAM: PORTABLE PELVIS 1-2 VIEWS COMPARISON:  None. FINDINGS: No displaced fracture is seen. Joint spaces in both hips appear symmetrical. Arterial calcifications are seen in the soft tissues. There are metallic densities in the prostate suggesting previous brachytherapy. Skin staples are seen in the lower abdomen and left inguinal region suggesting recent surgery. There is surgical fusion in the lower lumbar spine. Vascular stents are noted in the course of common iliac arteries. IMPRESSION: No displaced fracture or dislocation is seen. Electronically Signed   By: Elmer Picker M.D.   On: 03/22/2021 11:01    Scheduled Meds:  acetaminophen  1,000 mg Oral Q6H   atorvastatin  80 mg Oral Daily   carvedilol  3.125 mg Oral BID WC   cyclobenzaprine   5 mg Oral QHS   enoxaparin (LOVENOX) injection  40 mg Subcutaneous Q24H   feeding supplement  237 mL Oral TID BM   mometasone-formoterol  2 puff Inhalation BID   multivitamin with minerals  1 tablet Oral Daily   nicotine  21 mg Transdermal Daily   pantoprazole  40 mg Oral Daily   polyethylene glycol  17 g Oral Daily   QUEtiapine  25 mg Oral QHS   sacubitril-valsartan  1 tablet Oral BID   valproic acid  250 mg Oral BID   Continuous Infusions:  sodium chloride 100 mL/hr at 03/23/21 0928     LOS: 5 days   Time spent: 28-minute   Darliss Cheney, MD [facility]  03/23/2021, 1:43 PM  Please page via [facility] and do not message via secure chat for anything urgent. Secure chat can be used for anything non urgent.  How to contact the [facility] Attending or Consulting provider [place] or covering provider during after hours [place], for this patient?  Check the care team in [facility] and look for a) attending/consulting [facility] provider listed and b) the [facility] team listed. Page or secure chat [phone]. Log into [email or web address] and use [place]'s universal password to access. If you do not have the password, please contact the hospital operator. Locate the [facility] provider you are looking for under [facility] and page to a number that you can be directly reached. If you still have difficulty reaching the provider, please page the [facility] (Director on Call) for the Hospitalists listed on amion for assistance.

## 2021-03-24 MED ORDER — ALUM & MAG HYDROXIDE-SIMETH 200-200-20 MG/5ML PO SUSP: 15.0000 mL | Freq: Four times a day (QID) | ORAL | Status: DC | PRN

## 2021-03-24 MED ORDER — ALUM & MAG HYDROXIDE-SIMETH 200-200-20 MG/5ML PO SUSP
15.0000 mL | Freq: Once | ORAL | Status: AC
  Administered 2021-03-24: 15 mL via ORAL
  Filled 2021-03-24: qty 30

## 2021-03-24 NOTE — Telephone Encounter (Signed)
Please call patients wife [name] needing help filling out PAF for [employer]

## 2021-03-24 NOTE — Progress Notes (Addendum)
PROGRESS NOTE    [name]  INO:[id number] DOB: 03-23-1946 DOA: 03/18/2021 PCP: Rusty Aus, MD   Brief Narrative:  [name] is a 75 y.o. male with medical history significant of AAA; COPD; CAD; chronic systolic CHF; PAD; HTN; HLD; and prostate CA presenting with n/v.   He was previously admitted to the neurosurgery service from 11/7-12 with SDH and was discharged to [facility] and remained there through 11/30.  He reports that he progressed well while there and had no apparent issues.  However, he arrived home on 11/30 and started vomiting shortly thereafter with any attempt at food/drink intake.   He has noticed a bulge in his left groin.  He was found to have an incarcerated hernia and is going to the OR now.   Of note, his wife has had a complicated hospitalization following hip fracture.  He reports that she was discharged today, after 5 weeks of hospitalization.      Incarcerated inguinal hernia, started vomiting yesterday.  Fall with LOC, intracranial hemorrhage, d/c from CIR on 11/30. Procedure: open left recurrent, strangulated inguinal hernia repair with phasix ST absorbable mesh, exploratory laparotomy, small bowel resection with primary anastomosis, primary repair of umbilical hernia   Pre-op diagnosis: incarcerated left inguinal hernia, reducible umbilical hernia Post-op diagnosis: strangulated left inguinal hernia, incarcerated umbilical hernia, 4cm  Assessment & Plan:   Principal Problem:   Incarcerated inguinal hernia Active Problems:   Hyperlipidemia   Essential hypertension   Tobacco abuse   AAA (abdominal aortic aneurysm) without rupture   Centrilobular emphysema (HCC)   Chronic combined systolic and diastolic CHF (congestive heart failure) (HCC)   Intraparenchymal hemorrhage of brain ([place])  Incarcerated inguinal hernia with small bowel obstruction: S/p surgical repair 03/18/2021.  Tolerating soft diet, passing flatus.  He tells me that he has had a bowel  movement as well but that is not charted.  General surgery has cleared him for discharge.  Chronic combined CHF: Echo March 2022 shows EF of 30 to 35% with grade 1 diastolic dysfunction.  He appears to be well compensated.  Continue Entresto and Aldactone.   Acute thrombocytopenia: Likely due to acute illness.  Resolved.  Essential hypertension: Controlled, continue Coreg and Entresto.  Hyperlipidemia: Continue Lipitor.  Hypokalemia: Resolved.  COPD: Not in exacerbation.  Continue Symbicort and albuterol.  PVD: -AAA, 3.8 cm -s/p aortobi-iliac stent graft, patent -Severe atherosclerotic disease on CT  Fall in the hospital: Per reports, patient had unwitnessed fall late night of 03/21/2021.  Reportedly, he did hit his head on the floor but he did not have any complaints.  He was assessed by night hospitalist.  CT head, lumbar spine as well as rib x-ray was done and no acute fracture was found.  He did not have any complaints when I saw him today but when surgery saw him, he was complaining of left hip pain, they obtained a pelvic ultrasound which ruled out hip fracture or pelvic fracture.  Delirium: Fully alert and oriented.  Continue Seroquel nightly  1. Avoid benzodiazepines, antihistamines, anticholinergics, and minimize opiate use as these may worsen delirium. 2: Assess, prevent and manage pain as lack of treatment can result in delirium.  3: Provide appropriate lighting and clear signage; a clock and calendar should be easily visible to the patient. 4: Monitor environmental factors. Reduce light and noise at night (close shades, turn off lights, turn off TV, ect). Correct any alterations in sleep cycle. 5: Reorient the patient to person, place, time  and situation on each encounter.  6: Correct sensory deficits if possible (replace eye glasses, hearing aids, ect). 7: Avoid restraints if able. Severely delirious patients benefit from constant observation by a sitter.  Placement: PT OT  recommends SNF.  TOC working with family to find placement, currently pending insurance authorization, potential discharge tomorrow.  DVT prophylaxis: enoxaparin (LOVENOX) injection 40 mg Start: 03/21/21 1015 SCDs Start: 03/18/21 1508   Code Status: DNR  Family Communication:  None present at bedside.  Updated wife over the phone.  Status is: Inpatient  Remains inpatient appropriate because: Likely stable for discharge, pending placement, potential discharge tomorrow.  Waiting for insurance authorization.  Estimated body mass index is 23.35 kg/m as calculated from the following:   Height as of this encounter: 6' (1.829 m).   Weight as of this encounter: 78.1 kg.  Pressure Injury 02/26/21 Coccyx Mid;Posterior Stage 2 -  Partial thickness loss of dermis presenting as a shallow open injury with a red, pink wound bed without slough. (Active)  02/26/21 1702  Location: Coccyx  Location Orientation: Mid;Posterior  Staging: Stage 2 -  Partial thickness loss of dermis presenting as a shallow open injury with a red, pink wound bed without slough.  Wound Description (Comments):   Present on Admission: Yes    Nutritional Assessment: Body mass index is 23.35 kg/m.[name] Seen by dietician.  I agree with the assessment and plan as outlined below: Nutrition Status: Nutrition Problem: Increased nutrient needs Etiology: post-op healing Signs/Symptoms: estimated needs    .  Skin Assessment: I have examined the patient's skin and I agree with the wound assessment as performed by the wound care RN as outlined below: Pressure Injury 02/26/21 Coccyx Mid;Posterior Stage 2 -  Partial thickness loss of dermis presenting as a shallow open injury with a red, pink wound bed without slough. (Active)  02/26/21 1702  Location: Coccyx  Location Orientation: Mid;Posterior  Staging: Stage 2 -  Partial thickness loss of dermis presenting as a shallow open injury with a red, pink wound bed without slough.  Wound  Description (Comments):   Present on Admission: Yes    Consultants:  General surgery  Procedures:  As above  Antimicrobials:  Anti-infectives (From admission, onward)    Start     Dose/Rate Route Frequency Ordered Stop   03/19/21 0600  cefoTEtan (CEFOTAN) 1 g in sodium chloride 0.9 % 100 mL IVPB        1 g 200 mL/hr over 30 Minutes Intravenous On call to O.R. 03/18/21 1412 03/19/21 0706   03/18/21 1516  sodium chloride 0.9 % with cefoTEtan (CEFOTAN) ADS Med       Note to Pharmacy: Renda Rolls L: cabinet override      03/18/21 1516 03/19/21 0329          Subjective:  Seen and examined.  He has no complaints.  No abdominal pain.  He is fully alert and oriented.  He cannot confirm if he has had any bowel movements but there are 2 bowel movements charted.  Objective: Vitals:   03/23/21 1709 03/23/21 2119 03/24/21 0416 03/24/21 0947  BP: 134/80 (!) 143/84 (!) 167/87 (!) 154/109  Pulse: 71 76 83 77  Resp: 17 18 18 16   Temp: 97.8 F (36.6 C) 98.2 F (36.8 C) 97.7 F (36.5 C) 98.2 F (36.8 C)  TempSrc: Oral Oral Oral Oral  SpO2: 94% 93% 93% 97%  Weight:  78.1 kg    Height:        Intake/Output  Summary (Last 24 hours) at 03/24/2021 1333 Last data filed at 03/24/2021 1300 Gross per 24 hour  Intake 460 ml  Output 200 ml  Net 260 ml    Filed Weights   03/18/21 2057 03/20/21 2135 03/23/21 2119  Weight: 73 kg 75.8 kg 78.1 kg    Examination:  General exam: Appears calm and comfortable  Respiratory system: Clear to auscultation. Respiratory effort normal. Cardiovascular system: S1 & S2 heard, RRR. No JVD, murmurs, rubs, gallops or clicks. No pedal edema. Gastrointestinal system: Abdomen is nondistended, soft and nontender. No organomegaly or masses felt. Normal bowel sounds heard. Central nervous system: Alert and oriented. No focal neurological deficits. Extremities: Symmetric 5 x 5 power. Skin: No rashes, lesions or ulcers.  Psychiatry: Judgement and insight  appear normal. Mood & affect appropriate.    Data Reviewed: I have personally reviewed following labs and imaging studies  CBC: Recent Labs  Lab 03/18/21 0840 03/19/21 0256 03/20/21 1502 03/21/21 0442 03/22/21 0340 03/23/21 0842  WBC 13.7* 14.0* 10.8* 10.9* 9.2 7.0  NEUTROABS 12.3*  --   --   --  7.8* 5.6  HGB 16.9 14.7 12.4* 12.3* 12.7* 12.7*  HCT 50.5 44.2 38.1* 36.6* 37.8* 38.7*  MCV 95.5 97.4 97.2 95.8 95.7 97.0  PLT 227 152 121* 124* 143* 625    Basic Metabolic Panel: Recent Labs  Lab 03/19/21 0256 03/20/21 1502 03/21/21 0442 03/22/21 0340 03/23/21 0842  NA 138 135 133* 133* 139  K 4.7 4.1 3.7 3.3* 4.0  CL 102 99 102 98 103  CO2 27 27 25 27 30   GLUCOSE 136* 95 126* 117* 92  BUN 25* 19 14 14 15   CREATININE 1.25* 0.91 0.85 0.93 0.90  CALCIUM 8.3* 8.1* 8.0* 8.1* 7.9*  MG  --   --   --   --  1.8    GFR: Estimated Creatinine Clearance: 75.4 mL/min (by C-G formula based on SCr of 0.9 mg/dL). Liver Function Tests: Recent Labs  Lab 03/18/21 0840  AST 18  ALT 15  ALKPHOS 78  BILITOT 1.3*  PROT 6.4*  ALBUMIN 3.5    Recent Labs  Lab 03/18/21 0840  LIPASE 22    No results for input(s): AMMONIA in the last 168 hours. Coagulation Profile: No results for input(s): INR, PROTIME in the last 168 hours. Cardiac Enzymes: No results for input(s): CKTOTAL, CKMB, CKMBINDEX, TROPONINI in the last 168 hours. BNP (last 3 results) No results for input(s): PROBNP in the last 8760 hours. HbA1C: No results for input(s): HGBA1C in the last 72 hours. CBG: Recent Labs  Lab 03/18/21 0841  GLUCAP 138*    Lipid Profile: No results for input(s): CHOL, HDL, LDLCALC, TRIG, CHOLHDL, LDLDIRECT in the last 72 hours. Thyroid Function Tests: No results for input(s): TSH, T4TOTAL, FREET4, T3FREE, THYROIDAB in the last 72 hours. Anemia Panel: No results for input(s): VITAMINB12, FOLATE, FERRITIN, TIBC, IRON, RETICCTPCT in the last 72 hours. Sepsis Labs: Recent Labs  Lab  03/18/21 0840  LATICACIDVEN 1.6     Recent Results (from the past 240 hour(s))  Resp Panel by RT-PCR (Flu A&B, Covid) Nasopharyngeal Swab     Status: None   Collection Time: 03/18/21  8:45 AM   Specimen: Nasopharyngeal Swab; Nasopharyngeal(NP) swabs in vial transport medium  Result Value Ref Range Status   SARS Coronavirus 2 by RT PCR NEGATIVE NEGATIVE Final    Comment: (NOTE) SARS-CoV-2 target nucleic acids are NOT DETECTED.  The SARS-CoV-2 RNA is generally detectable in upper respiratory specimens  during the acute phase of infection. The lowest concentration of SARS-CoV-2 viral copies this assay can detect is 138 copies/mL. A negative result does not preclude SARS-Cov-2 infection and should not be used as the sole basis for treatment or other patient management decisions. A negative result may occur with  improper specimen collection/handling, submission of specimen other than nasopharyngeal swab, presence of viral mutation(s) within the areas targeted by this assay, and inadequate number of viral copies(<138 copies/mL). A negative result must be combined with clinical observations, patient history, and epidemiological information. The expected result is Negative.  Fact Sheet for Patients:  EntrepreneurPulse.com.au  Fact Sheet for Healthcare Providers:  IncredibleEmployment.be  This test is no t yet approved or cleared by the [place] FDA and  has been authorized for detection and/or diagnosis of SARS-CoV-2 by FDA under an Emergency Use Authorization (EUA). This EUA will remain  in effect (meaning this test can be used) for the duration of the COVID-19 declaration under Section 564(b)(1) of the Act, 21 U.S.C.section 360bbb-3(b)(1), unless the authorization is terminated  or revoked sooner.       Influenza A by PCR NEGATIVE NEGATIVE Final   Influenza B by PCR NEGATIVE NEGATIVE Final    Comment: (NOTE) The Xpert Xpress  SARS-CoV-2/FLU/RSV plus assay is intended as an aid in the diagnosis of influenza from Nasopharyngeal swab specimens and should not be used as a sole basis for treatment. Nasal washings and aspirates are unacceptable for Xpert Xpress SARS-CoV-2/FLU/RSV testing.  Fact Sheet for Patients: EntrepreneurPulse.com.au  Fact Sheet for Healthcare Providers: IncredibleEmployment.be  This test is not yet approved or cleared by the [place] FDA and has been authorized for detection and/or diagnosis of SARS-CoV-2 by FDA under an Emergency Use Authorization (EUA). This EUA will remain in effect (meaning this test can be used) for the duration of the COVID-19 declaration under Section 564(b)(1) of the Act, 21 U.S.C. section 360bbb-3(b)(1), unless the authorization is terminated or revoked.  Performed at [facility], [place] [address], [place], [place] [address]        Radiology Studies: No results found.  Scheduled Meds:  acetaminophen  1,000 mg Oral Q6H   atorvastatin  80 mg Oral Daily   carvedilol  3.125 mg Oral BID WC   cyclobenzaprine  5 mg Oral QHS   enoxaparin (LOVENOX) injection  40 mg Subcutaneous Q24H   feeding supplement  237 mL Oral TID BM   mometasone-formoterol  2 puff Inhalation BID   multivitamin with minerals  1 tablet Oral Daily   nicotine  21 mg Transdermal Daily   pantoprazole  40 mg Oral Daily   polyethylene glycol  17 g Oral Daily   QUEtiapine  25 mg Oral QHS   sacubitril-valsartan  1 tablet Oral BID   spironolactone  12.5 mg Oral Daily   valproic acid  250 mg Oral BID   Continuous Infusions:  sodium chloride 100 mL/hr at 03/24/21 0503     LOS: 6 days   Time spent: 26-minute   Darliss Cheney, MD [facility]  03/24/2021, 1:33 PM  Please page via Shea Evans and do not message via secure chat for anything urgent. Secure chat can be used for anything non urgent.  How to contact the [facility] Attending or Consulting  provider [place] or covering provider during after hours [place], for this patient?  Check the care team in [facility] and look for a) attending/consulting TRH provider listed and b) the [facility] team listed. Page or  secure chat [phone]. Log into [email or web address] and use [place]'s universal password to access. If you do not have the password, please contact the hospital operator. Locate the [facility] provider you are looking for under [facility] and page to a number that you can be directly reached. If you still have difficulty reaching the provider, please page the [facility] (Director on Call) for the Hospitalists listed on amion for assistance.

## 2021-03-24 NOTE — TOC Progression Note (Addendum)
Transition of Care [facility]) - Initial/Assessment Note    Patient Details  Name: [name] MRN: [id number] Date of Birth: 07-30-1945  [facility] CM/SW Contact:    Milinda Antis, LCSWA Phone Number: 03/24/2021, 12:15 PM  Clinical Narrative:                 CSW spoke with the patient's son, Nicki Reaper.[phone])  The family has chose [facility].  CSW called Sharyn Lull with admission at [facility].  The facility does not have any beds available at this time.  CSW contacted patient's son and asked for a second facility choice.  The son choose [facility].  CSW spoke with Vara Guardian at [facility].  The facility is able to accept the patient when insurance Josem Kaufmann is received.  CSW contacted [employer] in an attempt to receive insurance auth.  There was no answer.  CSW left a VM for Herma Ard with [facility] requesting a returned call to receive insurance auth.    CSW called [facility] again and was able to reach [employer].  CSW requested to start insurance authorization for SNF and ambulance transport.  CSW informed [facility] that the patient was medically ready today and asked if insurance auth could be approved today.  CSW was informed that a decision will possibly be made by tomorrow.    Expected Discharge Plan: [place] Barriers to Discharge: No Barriers Identified   Patient Goals and CMS Choice Patient states their goals for this hospitalization and ongoing recovery are:: Return home CMS Medicare.gov Compare Post Acute Care list provided to:: Patient Represenative (must comment) Choice offered to / list presented to : Spouse, Patient  Expected Discharge Plan and Services Expected Discharge Plan: [place] In-house Referral: Clinical Social Work Discharge Planning Services: CM Consult Post Acute Care Choice: [place] arrangements for the past 2 months: Single Family Home                                       Prior Living Arrangements/Services Living arrangements for the past 2 months: Single Family Home Lives with:: Spouse Patient language and need for interpreter reviewed:: Yes Do you feel safe going back to the place where you live?: Yes      Need for Family Participation in Patient Care: Yes (Comment) Care giver support system in place?: Yes (comment) Current home services: DME (WC, walker, BSC) Criminal Activity/Legal Involvement Pertinent to Current Situation/Hospitalization: No - Comment as needed  Activities of Daily Living Home Assistive Devices/Equipment: None ADL Screening (condition at time of admission) Patient's cognitive ability adequate to safely complete daily activities?: Yes Is the patient deaf or have difficulty hearing?: No Does the patient have difficulty seeing, even when wearing glasses/contacts?: Yes Does the patient have difficulty concentrating, remembering, or making decisions?: Yes Patient able to express need for assistance with ADLs?: Yes Does the patient have difficulty dressing or bathing?: No Independently performs ADLs?: No Communication: Independent Dressing (OT): Needs assistance Is this a change from baseline?: Change from baseline, expected to last >3 days Grooming: Needs assistance Is this a change from baseline?: Change from baseline, expected to last >3 days Feeding: Needs assistance Is this a change from baseline?: Change from baseline, expected to last >3 days Bathing: Needs assistance Is this a change from baseline?: Change from baseline, expected to last >3 days  Toileting: Needs assistance Is this a change from baseline?: Change from baseline, expected to last >3days In/Out Bed: Needs assistance Is this a change from baseline?: Change from baseline, expected to last >3 days Walks in Home: Needs assistance Is this a change from baseline?: Change from baseline, expected to last >3 days Does the patient have difficulty walking or climbing  stairs?: Yes Weakness of Legs: Both Weakness of Arms/Hands: None  Permission Sought/Granted Permission sought to share information with : Facility Sport and exercise psychologist, Family Supports Permission granted to share information with : Yes, Verbal Permission Granted  Share Information with NAME: Vickie  Permission granted to share info w AGENCY: [place] granted to share info w Relationship: Spouse  Permission granted to share info w Contact Information: [phone]  Emotional Assessment Appearance:: Appears stated age Attitude/Demeanor/Rapport: Lethargic Affect (typically observed):  (Confused) Orientation: : Oriented to Self, Oriented to Situation Alcohol / Substance Use: Not Applicable Psych Involvement: No (comment)  Admission diagnosis:  Small bowel obstruction ([place]) [K56.609] Incarcerated inguinal hernia, unilateral [K40.30] Incarcerated inguinal hernia [K40.30] Patient Active Problem List   Diagnosis Date Noted   Incarcerated inguinal hernia 03/18/2021   Orthostatic hypotension    Chronic combined systolic and diastolic congestive heart failure (HCC)    Vascular headache    Pressure injury of skin 03/01/2021   Intraparenchymal hemorrhage of brain ([place]) 02/26/2021   SDH (subdural hematoma) 02/21/2021   Syncope 02/21/2021   Fall 02/21/2021   Hypertensive urgency 02/21/2021   HTN (hypertension) 02/21/2021   Stroke ([place]) 02/21/2021   Chronic combined systolic and diastolic CHF (congestive heart failure) ([place]) 02/21/2021   Leukocytosis 02/21/2021   Elevated troponin 02/21/2021   Zygomatic arch fracture ([place]) 02/21/2021   UTI (urinary tract infection) 02/21/2021   Sepsis ([place]) 02/21/2021   Other spondylosis with radiculopathy, lumbar region 09/04/2018   Centrilobular emphysema ([place]) 07/19/2018   Dilated cardiomyopathy ([place]) 07/19/2018   Coronary artery disease of native artery of native heart with stable angina pectoris ([place]) 01/20/2018   AAA (abdominal aortic  aneurysm) without rupture 10/30/2017   Personal history of tobacco use, presenting hazards to health 08/29/2016   COPD exacerbation ([place]) 04/03/2015   Medicare annual wellness visit, subsequent 05/23/2014   Barrett's esophagus 05/20/2014   Constipation 05/20/2014   Arthritis of hand 05/07/2014   COPD (chronic obstructive pulmonary disease) ([place]) 11/06/2011   Pulmonary nodule 11/06/2011   Tobacco abuse 02/12/2011   Tobacco abuse counseling 02/12/2011   CAD, NATIVE VESSEL 10/18/2008   COLONIC POLYPS 08/05/2008   Hyperlipidemia 08/05/2008   Essential hypertension 08/05/2008   GERD 08/05/2008   PCP:  Rusty Aus, MD Pharmacy:   [facility], [place] [address] [place] [address] Phone: [phone] Fax: [phone]  [facility], [place] - [address] [place] [address] Phone: [phone] Fax: [phone]     Social Determinants of Health (SDOH) Interventions    Readmission Risk Interventions No flowsheet data found.

## 2021-03-25 LAB — BASIC METABOLIC PANEL
Anion gap: 9 (ref 5–15)
BUN: 17 mg/dL (ref 8–23)
CO2: 25 mmol/L (ref 22–32)
Calcium: 7.5 mg/dL — ABNORMAL LOW (ref 8.9–10.3)
Chloride: 106 mmol/L (ref 98–111)
Creatinine, Ser: 0.87 mg/dL (ref 0.61–1.24)
GFR, Estimated: >60 mL/min (ref >60)
Glucose, Bld: 68 mg/dL — ABNORMAL LOW (ref 70–99)
Potassium: 4.1 mmol/L (ref 3.5–5.1)
Sodium: 140 mmol/L (ref 135–145)

## 2021-03-25 LAB — CBC WITH DIFFERENTIAL/PLATELET
Abs Immature Granulocytes: 0.03 10*3/uL (ref 0.00–0.07)
Basophils Absolute: 0 10*3/uL (ref 0.0–0.1)
Basophils Relative: 1 %
Eosinophils Absolute: 0.3 10*3/uL (ref 0.0–0.5)
Eosinophils Relative: 5 %
HCT: 38.4 % — ABNORMAL LOW (ref 39.0–52.0)
Hemoglobin: 12.8 g/dL — ABNORMAL LOW (ref 13.0–17.0)
Immature Granulocytes: 1 %
Lymphocytes Relative: 12 %
Lymphs Abs: 0.7 10*3/uL (ref 0.7–4.0)
MCH: 32 pg (ref 26.0–34.0)
MCHC: 33.3 g/dL (ref 30.0–36.0)
MCV: 96 fL (ref 80.0–100.0)
Monocytes Absolute: 0.7 10*3/uL (ref 0.1–1.0)
Monocytes Relative: 11 %
Neutro Abs: 4.3 10*3/uL (ref 1.7–7.7)
Neutrophils Relative %: 70 %
Platelets: 185 10*3/uL (ref 150–400)
RBC: 4 MIL/uL — ABNORMAL LOW (ref 4.22–5.81)
RDW: 12.5 % (ref 11.5–15.5)
WBC: 6.1 10*3/uL (ref 4.0–10.5)
nRBC: 0 % (ref 0.0–0.2)

## 2021-03-25 LAB — SARS CORONAVIRUS 2 (TAT 6-24 HRS): SARS Coronavirus 2: NEGATIVE

## 2021-03-25 LAB — MAGNESIUM: Magnesium: 1.7 mg/dL (ref 1.7–2.4)

## 2021-03-25 MED ORDER — ENTRESTO 97-103 MG PO TABS
1.0000 | ORAL_TABLET | Freq: Two times a day (BID) | ORAL | 3 refills | Status: DC
Start: 2021-03-25 — End: 2021-04-07

## 2021-03-25 MED ORDER — CYCLOBENZAPRINE HCL 5 MG PO TABS
5.0000 mg | ORAL_TABLET | Freq: Every day | ORAL | 0 refills | Status: AC
Start: 2021-03-25 — End: 2021-04-04

## 2021-03-25 NOTE — Telephone Encounter (Signed)
Return call to wife (DPR approved), she reports her and [name] lost their application for PA, needs new application for [place] mailed to them as they do not have access to a computer.   Application mailed to address on file. Will return for provider's signature and office will fax application to [employer]

## 2021-03-25 NOTE — Progress Notes (Signed)
Physical Therapy Treatment Patient Details Name: [name] MRN: [id number] DOB: 01-09-1946 Today's Date: 03/25/2021   History of Present Illness 75 y/o male presenting w/ N/V due to incarcerated inguinal hernia 12/2 s/p OR same date. Pt with recent D/C 11/30 from [facility] after SDH. PMHx: prostate CA, AAA, COPD, CAD, CVA, HTN, HLD, CHF    PT Comments    Pt lethargic and slow to arouse.  Appearing hallucinatory.  Emphasis on transitions, sit to stand, progression of gait stability in halls and in close quarters going to the toilet.    Recommendations for follow up therapy are one component of a multi-disciplinary discharge planning process, led by the attending physician.  Recommendations may be updated based on patient status, additional functional criteria and insurance authorization.  Follow Up Recommendations  Skilled nursing-short term rehab (<3 hours/day)     Assistance Recommended at Discharge Frequent or constant Supervision/Assistance  Equipment Recommendations  BSC/3in1    Recommendations for Other Services       Precautions / Restrictions Precautions Precautions: Fall Precaution Comments: watch O2     Mobility  Bed Mobility Overal bed mobility: Needs Assistance Bed Mobility: Supine to Sit     Supine to sit: Min assist     General bed mobility comments: cues and physical assis to the help initiate.  truncal assist up to sitting.  pt scooted to EOB with VC    Transfers Overall transfer level: Needs assistance Equipment used: Rolling walker (2 wheels) Transfers: Sit to/from Stand Sit to Stand: Min assist;From elevated surface (or with armrest to push from)                Ambulation/Gait Ambulation/Gait assistance: Min assist;Mod assist;+2 safety/equipment Gait Distance (Feet): 60 Feet Assistive device: Rolling walker (2 wheels) Gait Pattern/deviations: Step-through pattern;Decreased step length - right;Decreased step length - left;Decreased stride  length Gait velocity: decreased Gait velocity interpretation: <1.8 ft/sec, indicate of risk for recurrent falls   General Gait Details: P generally unsteady,  flexed posture/poor proximity to the RW initially and worsening with fatigue to the point needing +2 for safety   Stairs             Wheelchair Mobility    Modified Rankin (Stroke Patients Only)       Balance Overall balance assessment: Needs assistance Sitting-balance support: Single extremity supported;No upper extremity supported;Feet supported Sitting balance-Leahy Scale: Fair Sitting balance - Comments: EOB and toilet without assist   Standing balance support: Bilateral upper extremity supported;During functional activity Standing balance-Leahy Scale: Poor Standing balance comment: reliant on AD and external support                            Cognition Arousal/Alertness: Lethargic Behavior During Therapy: Flat affect Overall Cognitive Status: No family/caregiver present to determine baseline cognitive functioning                   Orientation Level: Time Current Attention Level: Sustained Memory: Decreased short-term memory Following Commands: Follows one step commands with increased time;Follows one step commands inconsistently Safety/Judgement: Decreased awareness of safety;Decreased awareness of deficits Awareness: Intellectual Problem Solving: Slow processing;Requires verbal cues          Exercises      General Comments General comments (skin integrity, edema, etc.): On 2L [place], Sats 96% at 69 bpm.  Pt appears hallucinatory and needs guard for safety at all times.      Pertinent Vitals/Pain Faces Pain Scale:  No hurt Pain Intervention(s): Monitored during session    Home Living                          Prior Function            PT Goals (current goals can now be found in the care plan section) Acute Rehab PT Goals Patient Stated Goal: did not state PT Goal  Formulation: With patient Time For Goal Achievement: 04/04/21 Potential to Achieve Goals: Fair Progress towards PT goals: Progressing toward goals    Frequency    Min 3X/week      PT Plan Current plan remains appropriate    Co-evaluation              AM-PAC PT "6 Clicks" Mobility   Outcome Measure  Help needed turning from your back to your side while in a flat bed without using bedrails?: A Little Help needed moving from lying on your back to sitting on the side of a flat bed without using bedrails?: A Little Help needed moving to and from a bed to a chair (including a wheelchair)?: A Little Help needed standing up from a chair using your arms (e.g., wheelchair or bedside chair)?: A Little Help needed to walk in hospital room?: A Lot Help needed climbing 3-5 steps with a railing? : Total 6 Click Score: 15    End of Session Equipment Utilized During Treatment: Oxygen Activity Tolerance: Patient tolerated treatment well Patient left: in chair;with call bell/phone within reach;with chair alarm set Nurse Communication: Mobility status PT Visit Diagnosis: Muscle weakness (generalized) (M62.81);Difficulty in walking, not elsewhere classified (R26.2);Unsteadiness on feet (R26.81)     Time: 1610-9604 PT Time Calculation (min) (ACUTE ONLY): 27 min  Charges:  $Gait Training: 8-22 mins $Therapeutic Activity: 8-22 mins                     03/25/2021  Nathaniel Carne., PT Acute Rehabilitation Services [phone]  (pager) [phone]  (office)   [name] 03/25/2021, 3:02 PM

## 2021-03-25 NOTE — Plan of Care (Signed)
  Problem: Clinical Measurements: Goal: Diagnostic test results will improve Outcome: Progressing   Problem: Clinical Measurements: Goal: Respiratory complications will improve Outcome: Progressing   Problem: Coping: Goal: Level of anxiety will decrease Outcome: Progressing   Problem: Nutrition: Goal: Adequate nutrition will be maintained Outcome: Progressing

## 2021-03-25 NOTE — Addendum Note (Signed)
Addended by: Wynema Birch on: 03/25/2021 08:03 AM   Modules accepted: Orders

## 2021-03-25 NOTE — Care Management Important Message (Signed)
Important Message  Patient Details  Name: [name] MRN: [id number] Date of Birth: 1946/02/14   Medicare Important Message Given:  Yes     Alwaleed Obeso 03/25/2021, 1:18 PM

## 2021-03-25 NOTE — Discharge Summary (Signed)
Physician Discharge Summary  HJALMER IOVINO VEH:[id number] DOB: 1945/09/04 DOA: 03/18/2021  PCP: Rusty Aus, MD  Admit date: 03/18/2021 Discharge date: 03/25/2021 30 Day Unplanned Readmission Risk Score    Flowsheet Row ED to Hosp-Admission (Current) from 03/18/2021 in [facility]  30 Day Unplanned Readmission Risk Score (%) 27.75 Filed at 03/25/2021 0400       This score is the patient's risk of an unplanned readmission within 30 days of being discharged (0 -100%). The score is based on dignosis, age, lab data, medications, orders, and past utilization.   Low:  0-14.9   Medium: 15-21.9   High: 22-29.9   Extreme: 30 and above          Admitted From: Home Disposition: SNF  Recommendations for Outpatient Follow-up:  Follow up with PCP in 1-2 weeks Please obtain BMP/CBC in one week Follow-up with general surgery as per their scheduled time and date Please follow up with your PCP on the following pending results: Unresulted Labs (From admission, onward)    None         Home Health: None Equipment/Devices: None  Discharge Condition: Stable CODE STATUS: DNR Diet recommendation: Low-sodium  Subjective: Seen and examined.  Slightly sleepy but easily arousable and when talking, seems to be oriented.  Denies any complaint.  Brief/Interim Summary: [name] is a 75 y.o. male with medical history significant of AAA; COPD; CAD; chronic systolic CHF; PAD; HTN; HLD; and prostate CA presented with n/v.   He was previously admitted to the neurosurgery service from 11/7-12 with SDH and was discharged to [facility] and remained there through 11/30.  He progressed well while there and had no apparent issues.  However, he arrived home on 11/30 and started vomiting shortly thereafter with any attempt at food/drink intake.  Upon arrival to ED, he was found to have an incarcerated hernia for which he underwent surgical repair in the OR right away and then was admitted to  hospital service.  He was followed by general surgery, started on clear liquid diet and diet advanced and he has been passing flatus and has had multiple bowel movements and tolerating regular diet.  He is cleared from general surgery for discharge.  Assessed by PT OT and they recommended SNF.  Finally family picked up a facility, insurance authorization started yesterday, I was informed by [facility] that we are hoping to receive authorization today and if so, patient will be discharged today.  He will follow-up with general surgery within 2 weeks.   Chronic combined CHF: Echo March 2022 shows EF of 30 to 35% with grade 1 diastolic dysfunction.  He appears to be well compensated.  Continue Entresto and Aldactone.    Acute thrombocytopenia: Likely due to acute illness.  Resolved.   Essential hypertension: Controlled, continue Coreg and Entresto.   Hyperlipidemia: Continue Lipitor.   Hypokalemia: Resolved.   COPD: Not in exacerbation.  Continue Symbicort and albuterol.   PVD: -AAA, 3.8 cm -s/p aortobi-iliac stent graft, patent -Severe atherosclerotic disease on CT   Fall in the hospital: Per reports, patient had unwitnessed fall late night of 03/21/2021.  Reportedly, he did hit his head on the floor but he did not have any complaints.  He was assessed by night hospitalist.  CT head revealed no new acute abnormality. Resolving anterior right temporal intraparenchymal hematoma. Small right convexity chronic subdural hematoma with unchanged 3 mm leftward midline shift. Lumbar spine as well as rib x-ray was done and no  acute fracture was found.  Due to him complaining of hip pain, pelvic x-ray was also done which ruled out fracture.  Patient has been seen by PT OT as well and they did not notice any deficit.  Patient is not complaining of any deficit either and on my examination, he has no focal deficit either.   Delirium: Was treated with Seroquel.  Currently oriented although he is sometimes sleepy.  I am  hoping he will do better out of the hospital.  Discharge Diagnoses:  Principal Problem:   Incarcerated inguinal hernia Active Problems:   Hyperlipidemia   Essential hypertension   Tobacco abuse   AAA (abdominal aortic aneurysm) without rupture   Centrilobular emphysema (HCC)   Chronic combined systolic and diastolic CHF (congestive heart failure) (HCC)   Intraparenchymal hemorrhage of brain ([place])    Discharge Instructions   Allergies as of 03/25/2021       Reactions   Lyrica [pregabalin] Swelling      Prednisone Other (See Comments)   Feels like having a heart attack. Has had the injection form before and does well. Oral Steroids        Medication List     STOP taking these medications    diclofenac Sodium 1 % Gel Commonly known as: VOLTAREN   HYDROcodone-acetaminophen 10-325 MG tablet Commonly known as: NORCO       TAKE these medications    acetaminophen 500 MG tablet Commonly known as: TYLENOL Take 2 tablets (1,000 mg total) by mouth every 6 (six) hours as needed. What changed:  medication strength how much to take when to take this reasons to take this   albuterol 108 (90 Base) MCG/ACT inhaler Commonly known as: VENTOLIN HFA Inhale 2 puffs into the lungs every 6 (six) hours as needed for wheezing.   atorvastatin 80 MG tablet Commonly known as: LIPITOR Take 1 tablet (80 mg total) by mouth daily.   budesonide-formoterol 160-4.5 MCG/ACT inhaler Commonly known as: Symbicort Inhale 2 puffs into the lungs 2 (two) times daily.   carvedilol 3.125 MG tablet Commonly known as: COREG Take 1 tablet (3.125 mg total) by mouth 2 (two) times daily.   cyclobenzaprine 5 MG tablet Commonly known as: FLEXERIL Take 1 tablet (5 mg total) by mouth at bedtime for 10 days.   Entresto 97-103 MG Generic drug: sacubitril-valsartan Take 1 tablet by mouth 2 (two) times daily.   multivitamin with minerals tablet Take 1 tablet by mouth daily.   nicotine 21 mg/24hr  patch Commonly known as: NICODERM CQ - dosed in mg/24 hours Place 1 patch (21 mg total) onto the skin daily.   omeprazole 40 MG capsule Commonly known as: PRILOSEC TAKE 1 CAPSULE BY MOUTH DAILY USUALLY 30MINUTES BEFORE BREAKFAST What changed: See the new instructions.   oxyCODONE 5 MG immediate release tablet Commonly known as: Oxy IR/ROXICODONE Take 1-2 tablets (5-10 mg total) by mouth every 4 (four) hours as needed for moderate pain.   polyethylene glycol powder 17 GM/SCOOP powder Commonly known as: GLYCOLAX/MIRALAX Take 17 g by mouth daily as needed for mild constipation.   senna-docusate 8.6-50 MG tablet Commonly known as: Senokot-S Take 2 tablets by mouth at bedtime.   spironolactone 25 MG tablet Commonly known as: ALDACTONE Take 0.5 tablets (12.5 mg total) by mouth daily.   valproic acid 250 MG capsule Commonly known as: DEPAKENE Take 1 capsule (250 mg total) by mouth 2 (two) times daily.        Follow-up Information  [facility] [place] Follow up on 03/29/2021.   Specialty: General Surgery Why: 10am, arrive by 9:30am.  This is a nurse visit for staple removal. Contact information: [address] [place] [address] [phone]         Jesusita Oka, MD Follow up on 04/14/2021.   Specialty: Surgery Why: 9am, arrive by 8:45am for check in Contact information: [address] [place] [address] [phone]         Rusty Aus, MD Follow up in 1 week(s).   Specialty: Internal Medicine Contact information: [place] [address] [phone]                Allergies  Allergen Reactions   Lyrica [Pregabalin] Swelling        Prednisone Other (See Comments)    Feels like having a heart attack. Has had the injection form before and does well. Oral Steroids    Consultations: General surgery   Procedures/Studies: DG Ribs Unilateral W/Chest  Right  Result Date: 03/22/2021 CLINICAL DATA:  Fall, back pain EXAM: RIGHT RIBS AND CHEST - 3+ VIEW COMPARISON:  None FINDINGS: No fracture or other bone lesions are seen involving the ribs. There is no evidence of pneumothorax or pleural effusion. Both lungs are clear. Heart size and mediastinal contours are within normal limits. IMPRESSION: Negative. Electronically Signed   By: Fidela Salisbury M.D.   On: 03/22/2021 01:58   DG Lumbar Spine 2-3 Views  Result Date: 03/22/2021 CLINICAL DATA:  Fall, back pain EXAM: LUMBAR SPINE - 2-3 VIEW COMPARISON:  None. FINDINGS: A 10 cm wire like density overlies the soft tissues posterior to L1-3 and may represent an object overlying the patient or a retained radiopaque foreign body. L3-L5 lumbar fusion with instrumentation has been performed. No acute fracture or listhesis of the lumbar spine. Vertebral body height is preserved. There is intervertebral disc space narrowing and endplate remodeling of I77-O2 in keeping with changes of mild to moderate degenerative disc disease. Aortoiliac stent graft is in place. IMPRESSION: No acute fracture or listhesis of the lumbar spine. Wire like density overlying the left paraspinal soft tissues. Clinical correlation for a penetrating retained foreign body is recommended. Electronically Signed   By: Fidela Salisbury M.D.   On: 03/22/2021 01:57   CT HEAD WO CONTRAST (5MM)  Result Date: 03/22/2021 CLINICAL DATA:  Fall EXAM: CT HEAD WITHOUT CONTRAST TECHNIQUE: Contiguous axial images were obtained from the base of the skull through the vertex without intravenous contrast. COMPARISON:  03/18/2021 FINDINGS: Brain: No acute hemorrhage. Small right convexity chronic subdural hematoma. Unchanged leftward midline shift of approximately 3 mm. Resolving anterior right temporal intraparenchymal hematoma. Vascular: Bilateral carotid atherosclerosis. Skull: Normal. Negative for fracture or focal lesion. Sinuses/Orbits: No acute finding. Other:  None. IMPRESSION: 1. No new acute abnormality. 2. Resolving anterior right temporal intraparenchymal hematoma. 3. Small right convexity chronic subdural hematoma with unchanged 3 mm leftward midline shift. Electronically Signed   By: Ulyses Jarred M.D.   On: 03/22/2021 01:13   CT Head Wo Contrast  Result Date: 03/18/2021 CLINICAL DATA:  Nausea and vomiting EXAM: CT HEAD WITHOUT CONTRAST TECHNIQUE: Contiguous axial images were obtained from the base of the skull through the vertex without intravenous contrast. COMPARISON:  Head CT dated February 23, 2021 FINDINGS: Brain: Right subdural hemorrhage of the right cerebral convexity and right temporal lobe intraparenchymal hemorrhage are decreased in size and density when compared with  prior exam. Approximately 5 mm of right to left midline shift, unchanged compared to prior. Basal cisterns are patent. Vascular: No hyperdense vessel or unexpected calcification. Skull: Normal. Negative for fracture or focal lesion. Sinuses/Orbits: No acute finding. Other: None. IMPRESSION: 1. Expected evolution of right subdural hemorrhage and right temporal lobe intraparenchymal hemorrhage. Similar mass effect with approximately 5 mm of leftward midline shift. 2. No new acute abnormality. Electronically Signed   By: Yetta Glassman M.D.   On: 03/18/2021 09:38   CT HEAD WO CONTRAST (5MM)  Result Date: 02/23/2021 CLINICAL DATA:  Subdural hematoma EXAM: CT HEAD WITHOUT CONTRAST TECHNIQUE: Contiguous axial images were obtained from the base of the skull through the vertex without intravenous contrast. COMPARISON:  February 21, 2021. FINDINGS: Brain: Similar size of a subdural hemorrhage overlying the right cerebral convexity, measuring up to approximately 7 mm at the level of the right parietal convexity. Redemonstrated extension along the falx and tentorial leaflets with prominent right tentorial component measuring up to 1.5 cm in thickness. Superimposed, contiguous intraparenchymal  hemorrhage in the adjacent lateral right temporal lobe appears similar and measures up to approximately 2.7 cm. Similar adjacent mild surrounding intraparenchymal edema. Similar regional mass effect in the temporal lobe and similar leftward midline shift, approximately 5 mm near the foramina [place] (similar when remeasured on the prior). Trace subarachnoid hemorrhage along the right parietal convexity is decreased in conspicuity. No definite new hemorrhage. Basal cisterns are patent. No hydrocephalus. Partial effacement of the right lateral ventricle, similar. No evidence of acute large vascular territory infarct. Similar patchy white matter hypoattenuation, nonspecific but compatible with chronic microvascular ischemic disease. Vascular: Calcific intracranial atherosclerosis. Skull: No acute fracture. Sinuses/Orbits: Near complete opacification of the sphenoid sinuses and mucosal thickening of ethmoid air cells. No acute orbital findings. Other: No mastoid effusions. IMPRESSION: 1. No significant change in acute subdural hemorrhage along the right cerebral convexity, measuring up to 7 mm in thickness at the level the right parietal convexity. Similar extension along the falx and tentorium with prominent tentorial component measuring up to 1.5 cm. Similar mass effect with approximately 5 mm leftward midline shift (similar when remeasured on the prior). 2. Similar adjacent 2.7 cm right temporal lobe intraparenchymal hemorrhagic contusion. 3. Decreased conspicuity of trace subarachnoid hemorrhage along the right parietal convexity. 4. No other new acute abnormality. Electronically Signed   By: Margaretha Sheffield M.D.   On: 02/23/2021 08:38   CT ABDOMEN PELVIS W CONTRAST  Result Date: 03/18/2021 CLINICAL DATA:  Abdominal pain, hernia suspected EXAM: CT ABDOMEN AND PELVIS WITH CONTRAST TECHNIQUE: Multidetector CT imaging of the abdomen and pelvis was performed using the standard protocol following bolus  administration of intravenous contrast. CONTRAST:  145mL OMNIPAQUE IOHEXOL 300 MG/ML  SOLN COMPARISON:  CT abdomen and pelvis 10/30/2017 FINDINGS: Lower chest: No acute process identified. Hepatobiliary: Liver is normal in size and contour with no suspicious mass identified. Small hypodensity adjacent to the falciform ligament likely represents focal fatty infiltration. Gallbladder is mildly distended and contains 1.4 cm calculus in the neck. No gallbladder wall thickening or pericholecystic edema. No biliary ductal dilatation identified. Pancreas: Atrophic with no suspicious mass or ductal dilatation visualized. Spleen: Normal in size without focal abnormality. Adrenals/Urinary Tract: 1.2 cm left adrenal gland nodule, stable and likely adenoma. Also stable appearing right adrenal gland nodularity measuring 1.2 cm. Two small nonobstructing calculi identified in the left kidney measuring up to 3 mm in the upper pole. Small hypodense cyst in the lower right kidney. No enhancing renal  mass or hydronephrosis identified bilaterally. Urinary bladder appears grossly normal. Stomach/Bowel: Numerous loops of abnormally distended small bowel with air-fluid levels measuring up to 3.7 cm in diameter. There is a transition point identified within a left inguinal hernia which contains fat and a loop of small bowel. No pneumatosis or free air identified. Mild colonic diverticulosis. Appendix is normal. Vascular/Lymphatic: Severe atherosclerotic disease. Aorto bi-iliac stent graft which is patent, for the fusiform infrarenal abdominal aortic aneurysm seen on previous studies, which measures up to 3.8 cm in diameter. No bulky lymphadenopathy identified. Reproductive: Brachytherapy seeds in the prostate. Other: Umbilical hernia containing fat.  No ascites. Musculoskeletal: Degenerative and postsurgical changes of the lumbar spine. IMPRESSION: 1. High-grade small bowel obstruction with transition point in the left inguinal hernia.  Surgical consultation recommended. 2. Cholelithiasis. 3. Left nephrolithiasis. Bilateral adrenal gland nodules, likely adenomas. 4. Multiple additional chronic findings as described. Findings discussed with Dr. Maryan Rued over the telephone at 1:09 p.m. on 03/18/2021 with read back. Electronically Signed   By: Ofilia Neas M.D.   On: 03/18/2021 13:19   DG Pelvis Portable  Result Date: 03/22/2021 CLINICAL DATA:  Trauma, fall EXAM: PORTABLE PELVIS 1-2 VIEWS COMPARISON:  None. FINDINGS: No displaced fracture is seen. Joint spaces in both hips appear symmetrical. Arterial calcifications are seen in the soft tissues. There are metallic densities in the prostate suggesting previous brachytherapy. Skin staples are seen in the lower abdomen and left inguinal region suggesting recent surgery. There is surgical fusion in the lower lumbar spine. Vascular stents are noted in the course of common iliac arteries. IMPRESSION: No displaced fracture or dislocation is seen. Electronically Signed   By: Elmer Picker M.D.   On: 03/22/2021 11:01   DG Chest Port 1 View  Result Date: 03/18/2021 CLINICAL DATA:  Shortness of breath, history of COPD and smoking. EXAM: PORTABLE CHEST 1 VIEW COMPARISON:  Chest radiograph 02/21/2021 FINDINGS: Interval removal of endotracheal tube and enteric tube. Stable cardiomediastinal silhouette with tortuous thoracic aorta. Aortic calcifications. Lungs are clear. No acute osseous abnormality. IMPRESSION: No acute cardiopulmonary abnormality. Aortic Atherosclerosis (ICD10-I70.0). Electronically Signed   By: Ileana Roup M.D.   On: 03/18/2021 09:08     Discharge Exam: Vitals:   03/24/21 2122 03/25/21 0539  BP: (!) 155/65 (!) 129/113  Pulse: (!) 58 67  Resp: 17 18  Temp: 98.4 F (36.9 C) 98.1 F (36.7 C)  SpO2: 93% 94%   Vitals:   03/24/21 0416 03/24/21 0947 03/24/21 2122 03/25/21 0539  BP: (!) 167/87 (!) 154/109 (!) 155/65 (!) 129/113  Pulse: 83 77 (!) 58 67  Resp: 18 16 17  18   Temp: 97.7 F (36.5 C) 98.2 F (36.8 C) 98.4 F (36.9 C) 98.1 F (36.7 C)  TempSrc: Oral Oral  Oral  SpO2: 93% 97% 93% 94%  Weight:    77.1 kg  Height:        General: Pt is slightly sleepy, not in acute distress Cardiovascular: RRR, S1/S2 +, no rubs, no gallops Respiratory: CTA bilaterally, no wheezing, no rhonchi Abdominal: Soft, NT, ND, bowel sounds + Extremities: no edema, no cyanosis    The results of significant diagnostics from this hospitalization (including imaging, microbiology, ancillary and laboratory) are listed below for reference.     Microbiology: Recent Results (from the past 240 hour(s))  Resp Panel by RT-PCR (Flu A&B, Covid) Nasopharyngeal Swab     Status: None   Collection Time: 03/18/21  8:45 AM   Specimen: Nasopharyngeal Swab; Nasopharyngeal(NP) swabs in vial  transport medium  Result Value Ref Range Status   SARS Coronavirus 2 by RT PCR NEGATIVE NEGATIVE Final    Comment: (NOTE) SARS-CoV-2 target nucleic acids are NOT DETECTED.  The SARS-CoV-2 RNA is generally detectable in upper respiratory specimens during the acute phase of infection. The lowest concentration of SARS-CoV-2 viral copies this assay can detect is 138 copies/mL. A negative result does not preclude SARS-Cov-2 infection and should not be used as the sole basis for treatment or other patient management decisions. A negative result may occur with  improper specimen collection/handling, submission of specimen other than nasopharyngeal swab, presence of viral mutation(s) within the areas targeted by this assay, and inadequate number of viral copies(<138 copies/mL). A negative result must be combined with clinical observations, patient history, and epidemiological information. The expected result is Negative.  Fact Sheet for Patients:  EntrepreneurPulse.com.au  Fact Sheet for Healthcare Providers:  IncredibleEmployment.be  This test is no t yet  approved or cleared by the [place] FDA and  has been authorized for detection and/or diagnosis of SARS-CoV-2 by FDA under an Emergency Use Authorization (EUA). This EUA will remain  in effect (meaning this test can be used) for the duration of the COVID-19 declaration under Section 564(b)(1) of the Act, 21 U.S.C.section 360bbb-3(b)(1), unless the authorization is terminated  or revoked sooner.       Influenza A by PCR NEGATIVE NEGATIVE Final   Influenza B by PCR NEGATIVE NEGATIVE Final    Comment: (NOTE) The Xpert Xpress SARS-CoV-2/FLU/RSV plus assay is intended as an aid in the diagnosis of influenza from Nasopharyngeal swab specimens and should not be used as a sole basis for treatment. Nasal washings and aspirates are unacceptable for Xpert Xpress SARS-CoV-2/FLU/RSV testing.  Fact Sheet for Patients: EntrepreneurPulse.com.au  Fact Sheet for Healthcare Providers: IncredibleEmployment.be  This test is not yet approved or cleared by the [place] FDA and has been authorized for detection and/or diagnosis of SARS-CoV-2 by FDA under an Emergency Use Authorization (EUA). This EUA will remain in effect (meaning this test can be used) for the duration of the COVID-19 declaration under Section 564(b)(1) of the Act, 21 U.S.C. section 360bbb-3(b)(1), unless the authorization is terminated or revoked.  Performed at [facility], [place] [address] [place], [place] [address]   SARS CORONAVIRUS 2 (TAT 6-24 HRS) Nasopharyngeal Nasopharyngeal Swab     Status: None   Collection Time: 03/24/21  1:48 PM   Specimen: Nasopharyngeal Swab  Result Value Ref Range Status   SARS Coronavirus 2 NEGATIVE NEGATIVE Final    Comment: (NOTE) SARS-CoV-2 target nucleic acids are NOT DETECTED.  The SARS-CoV-2 RNA is generally detectable in upper and lower respiratory specimens during the acute phase of infection. Negative results do not preclude SARS-CoV-2  infection, do not rule out co-infections with other pathogens, and should not be used as the sole basis for treatment or other patient management decisions. Negative results must be combined with clinical observations, patient history, and epidemiological information. The expected result is Negative.  Fact Sheet for Patients: SugarRoll.be  Fact Sheet for Healthcare Providers: https://www.woods-mathews.com/  This test is not yet approved or cleared by the [place] FDA and  has been authorized for detection and/or diagnosis of SARS-CoV-2 by FDA under an Emergency Use Authorization (EUA). This EUA will remain  in effect (meaning this test can be used) for the duration of the COVID-19 declaration under Se ction 564(b)(1) of the Act, 21 U.S.C. section 360bbb-3(b)(1), unless the authorization is terminated or  revoked sooner.  Performed at [facility], [place] [address], [place], [place] [address]      Labs: BNP (last 3 results) Recent Labs    02/21/21 0503  BNP 5,366.4*   Basic Metabolic Panel: Recent Labs  Lab 03/20/21 1502 03/21/21 0442 03/22/21 0340 03/23/21 0842 03/25/21 0313  NA 135 133* 133* 139 140  K 4.1 3.7 3.3* 4.0 4.1  CL 99 102 98 103 106  CO2 27 25 27 30 25   GLUCOSE 95 126* 117* 92 68*  BUN 19 14 14 15 17   CREATININE 0.91 0.85 0.93 0.90 0.87  CALCIUM 8.1* 8.0* 8.1* 7.9* 7.5*  MG  --   --   --  1.8 1.7   Liver Function Tests: Recent Labs  Lab 03/18/21 0840  AST 18  ALT 15  ALKPHOS 78  BILITOT 1.3*  PROT 6.4*  ALBUMIN 3.5   Recent Labs  Lab 03/18/21 0840  LIPASE 22   No results for input(s): AMMONIA in the last 168 hours. CBC: Recent Labs  Lab 03/18/21 0840 03/19/21 0256 03/20/21 1502 03/21/21 0442 03/22/21 0340 03/23/21 0842 03/25/21 0313  WBC 13.7*   < > 10.8* 10.9* 9.2 7.0 6.1  NEUTROABS 12.3*  --   --   --  7.8* 5.6 4.3  HGB 16.9   < > 12.4* 12.3* 12.7* 12.7* 12.8*  HCT 50.5   <  > 38.1* 36.6* 37.8* 38.7* 38.4*  MCV 95.5   < > 97.2 95.8 95.7 97.0 96.0  PLT 227   < > 121* 124* 143* 182 185   < > = values in this interval not displayed.   Cardiac Enzymes: No results for input(s): CKTOTAL, CKMB, CKMBINDEX, TROPONINI in the last 168 hours. BNP: Invalid input(s): POCBNP CBG: Recent Labs  Lab 03/18/21 0841  GLUCAP 138*   D-Dimer No results for input(s): DDIMER in the last 72 hours. Hgb A1c No results for input(s): HGBA1C in the last 72 hours. Lipid Profile No results for input(s): CHOL, HDL, LDLCALC, TRIG, CHOLHDL, LDLDIRECT in the last 72 hours. Thyroid function studies No results for input(s): TSH, T4TOTAL, T3FREE, THYROIDAB in the last 72 hours.  Invalid input(s): FREET3 Anemia work up No results for input(s): VITAMINB12, FOLATE, FERRITIN, TIBC, IRON, RETICCTPCT in the last 72 hours. Urinalysis    Component Value Date/Time   COLORURINE YELLOW (A) 02/21/2021 0641   APPEARANCEUR HAZY (A) 02/21/2021 0641   APPEARANCEUR Cloudy 04/21/2011 1418   LABSPEC 1.021 02/21/2021 0641   LABSPEC 1.013 04/21/2011 1418   PHURINE 5.0 02/21/2021 0641   GLUCOSEU NEGATIVE 02/21/2021 0641   GLUCOSEU Negative 04/21/2011 1418   HGBUR MODERATE (A) 02/21/2021 0641   BILIRUBINUR NEGATIVE 02/21/2021 0641   BILIRUBINUR Negative 04/21/2011 1418   KETONESUR 5 (A) 02/21/2021 0641   PROTEINUR 30 (A) 02/21/2021 0641   NITRITE POSITIVE (A) 02/21/2021 0641   LEUKOCYTESUR MODERATE (A) 02/21/2021 0641   LEUKOCYTESUR Negative 04/21/2011 1418   Sepsis Labs Invalid input(s): PROCALCITONIN,  WBC,  LACTICIDVEN Microbiology Recent Results (from the past 240 hour(s))  Resp Panel by RT-PCR (Flu A&B, Covid) Nasopharyngeal Swab     Status: None   Collection Time: 03/18/21  8:45 AM   Specimen: Nasopharyngeal Swab; Nasopharyngeal(NP) swabs in vial transport medium  Result Value Ref Range Status   SARS Coronavirus 2 by RT PCR NEGATIVE NEGATIVE Final    Comment: (NOTE) SARS-CoV-2 target  nucleic acids are NOT DETECTED.  The SARS-CoV-2 RNA is generally detectable in upper respiratory specimens during the  acute phase of infection. The lowest concentration of SARS-CoV-2 viral copies this assay can detect is 138 copies/mL. A negative result does not preclude SARS-Cov-2 infection and should not be used as the sole basis for treatment or other patient management decisions. A negative result may occur with  improper specimen collection/handling, submission of specimen other than nasopharyngeal swab, presence of viral mutation(s) within the areas targeted by this assay, and inadequate number of viral copies(<138 copies/mL). A negative result must be combined with clinical observations, patient history, and epidemiological information. The expected result is Negative.  Fact Sheet for Patients:  EntrepreneurPulse.com.au  Fact Sheet for Healthcare Providers:  IncredibleEmployment.be  This test is no t yet approved or cleared by the [place] FDA and  has been authorized for detection and/or diagnosis of SARS-CoV-2 by FDA under an Emergency Use Authorization (EUA). This EUA will remain  in effect (meaning this test can be used) for the duration of the COVID-19 declaration under Section 564(b)(1) of the Act, 21 U.S.C.section 360bbb-3(b)(1), unless the authorization is terminated  or revoked sooner.       Influenza A by PCR NEGATIVE NEGATIVE Final   Influenza B by PCR NEGATIVE NEGATIVE Final    Comment: (NOTE) The Xpert Xpress SARS-CoV-2/FLU/RSV plus assay is intended as an aid in the diagnosis of influenza from Nasopharyngeal swab specimens and should not be used as a sole basis for treatment. Nasal washings and aspirates are unacceptable for Xpert Xpress SARS-CoV-2/FLU/RSV testing.  Fact Sheet for Patients: EntrepreneurPulse.com.au  Fact Sheet for Healthcare  Providers: IncredibleEmployment.be  This test is not yet approved or cleared by the [place] FDA and has been authorized for detection and/or diagnosis of SARS-CoV-2 by FDA under an Emergency Use Authorization (EUA). This EUA will remain in effect (meaning this test can be used) for the duration of the COVID-19 declaration under Section 564(b)(1) of the Act, 21 U.S.C. section 360bbb-3(b)(1), unless the authorization is terminated or revoked.  Performed at [facility], [place] [address], [place], [place] [address]   SARS CORONAVIRUS 2 (TAT 6-24 HRS) Nasopharyngeal Nasopharyngeal Swab     Status: None   Collection Time: 03/24/21  1:48 PM   Specimen: Nasopharyngeal Swab  Result Value Ref Range Status   SARS Coronavirus 2 NEGATIVE NEGATIVE Final    Comment: (NOTE) SARS-CoV-2 target nucleic acids are NOT DETECTED.  The SARS-CoV-2 RNA is generally detectable in upper and lower respiratory specimens during the acute phase of infection. Negative results do not preclude SARS-CoV-2 infection, do not rule out co-infections with other pathogens, and should not be used as the sole basis for treatment or other patient management decisions. Negative results must be combined with clinical observations, patient history, and epidemiological information. The expected result is Negative.  Fact Sheet for Patients: SugarRoll.be  Fact Sheet for Healthcare Providers: https://www.woods-mathews.com/  This test is not yet approved or cleared by the [place] FDA and  has been authorized for detection and/or diagnosis of SARS-CoV-2 by FDA under an Emergency Use Authorization (EUA). This EUA will remain  in effect (meaning this test can be used) for the duration of the COVID-19 declaration under Se ction 564(b)(1) of the Act, 21 U.S.C. section 360bbb-3(b)(1), unless the authorization is terminated or revoked sooner.  Performed at  [facility], [place] [address] [place], [place] [address]      Time coordinating discharge: Over 30 minutes  SIGNED:   Darliss Cheney, MD  [facility] 03/25/2021, 7:53 AM  If 7PM-7AM, please  contact night-coverage [email or web address]

## 2021-03-25 NOTE — Progress Notes (Signed)
Occupational Therapy Treatment Patient Details Name: [name] MRN: [id number] DOB: 09-29-1945 Today's Date: 03/25/2021   History of present illness 75 y/o male presenting w/ N/V due to incarcerated inguinal hernia 12/2 s/p OR same date. Pt with recent D/C 11/30 from CIR after SDH. PMHx: prostate CA, AAA, COPD, CAD, CVA, HTN, HLD, CHF   OT comments  Pt making good progress with functional goals although with decreased cognition/confusion with halluciantions and activity tolerance. Session focused on toilet transfers, toileting tasks, LB dressing, grooming standing at sink, functional mobility using RW and sitting tolerance/balance. OT will continue to follow acutely to maximize level of function and safety   Recommendations for follow up therapy are one component of a multi-disciplinary discharge planning process, led by the attending physician.  Recommendations may be updated based on patient status, additional functional criteria and insurance authorization.    Follow Up Recommendations  Skilled nursing-short term rehab (<3 hours/day)    Assistance Recommended at Discharge Frequent or constant Supervision/Assistance  Equipment Recommendations  Other (comment) (TBD at SNF)    Recommendations for Other Services      Precautions / Restrictions Precautions Precautions: Fall Precaution Comments: watch O2 Restrictions Weight Bearing Restrictions: No       Mobility Bed Mobility Overal bed mobility: Needs Assistance Bed Mobility: Supine to Sit     Supine to sit: Min assist     General bed mobility comments: pt up walking with PT upon arrival    Transfers Overall transfer level: Needs assistance Equipment used: Rolling walker (2 wheels) Transfers: Sit to/from Stand Sit to Stand: Min assist;From elevated surface Stand pivot transfers: Min assist         General transfer comment: Min A to power up and steady     Balance Overall balance assessment: Needs  assistance Sitting-balance support: Single extremity supported;No upper extremity supported;Feet supported Sitting balance-Leahy Scale: Fair Sitting balance - Comments: EOB and toilet without assist   Standing balance support: Bilateral upper extremity supported;During functional activity Standing balance-Leahy Scale: Poor Standing balance comment: reliant on AD and external support                           ADL either performed or assessed with clinical judgement   ADL Overall ADL's : Needs assistance/impaired     Grooming: Wash/dry hands;Wash/dry face;Min guard;Standing           Upper Body Dressing : Min guard;Sitting;Cueing for safety   Lower Body Dressing: Sitting/lateral leans;Minimal assistance   Toilet Transfer: Minimal assistance;Stand-pivot;Rolling walker (2 wheels);Cueing for safety;Cueing for sequencing Toilet Transfer Details (indicate cue type and reason): vc for safe hand placement Toileting- Clothing Manipulation and Hygiene: Sit to/from stand;Minimal assistance       Functional mobility during ADLs: Minimal assistance;Rolling walker (2 wheels);Cueing for safety;Cueing for sequencing General ADL Comments: Pt presenting with decreased cognition/confusion with halluciantions and activity tolerance    Extremity/Trunk Assessment Upper Extremity Assessment Upper Extremity Assessment: Generalized weakness   Lower Extremity Assessment Lower Extremity Assessment: Defer to PT evaluation        Vision Baseline Vision/History: 1 Wears glasses Ability to See in Adequate Light: 0 Adequate Patient Visual Report: No change from baseline     Perception     Praxis      Cognition Arousal/Alertness: Lethargic Behavior During Therapy: Flat affect Overall Cognitive Status: No family/caregiver present to determine baseline cognitive functioning Area of Impairment: Safety/judgement;Awareness;Problem solving  Orientation Level:  Disoriented to;Time;Situation Current Attention Level: Sustained Memory: Decreased short-term memory Following Commands: Follows one step commands with increased time;Follows one step commands inconsistently Safety/Judgement: Decreased awareness of safety;Decreased awareness of deficits Awareness: Intellectual Problem Solving: Slow processing;Requires verbal cues            Exercises     Shoulder Instructions       General Comments On 2L [place], Sats 96% at 69 bpm.  Pt appears hallucinatory and needs guard for safety at all times.    Pertinent Vitals/ Pain       Pain Assessment: No/denies pain Faces Pain Scale: No hurt Breathing: normal Negative Vocalization: occasional moan/groan, low speech, negative/disapproving quality (mumbling) Facial Expression: smiling or inexpressive Consolability: distracted or reassured by voice/touch Pain Intervention(s): Monitored during session  Home Living                                          Prior Functioning/Environment              Frequency  Min 2X/week        Progress Toward Goals  OT Goals(current goals can now be found in the care plan section)  Progress towards OT goals: Progressing toward goals     Plan Discharge plan remains appropriate;Frequency remains appropriate    Co-evaluation                 AM-PAC OT "6 Clicks" Daily Activity     Outcome Measure   Help from another person eating meals?: None Help from another person taking care of personal grooming?: A Little Help from another person toileting, which includes using toliet, bedpan, or urinal?: A Little Help from another person bathing (including washing, rinsing, drying)?: A Lot Help from another person to put on and taking off regular upper body clothing?: A Little Help from another person to put on and taking off regular lower body clothing?: A Lot 6 Click Score: 17    End of Session Equipment Utilized During Treatment: Gait  belt;Rolling walker (2 wheels)  OT Visit Diagnosis: Unsteadiness on feet (R26.81);Muscle weakness (generalized) (M62.81)   Activity Tolerance Patient limited by lethargy;Patient tolerated treatment well   Patient Left with call bell/phone within reach;in chair;with chair alarm set   Nurse Communication          Time: 1442-1500 OT Time Calculation (min): 18 min  Charges: OT General Charges $OT Visit: 1 Visit OT Treatments $Self Care/Home Management : 8-22 mins    Britt Bottom 03/25/2021, 3:38 PM

## 2021-03-25 NOTE — TOC Progression Note (Addendum)
Transition of Care [facility]) - Initial/Assessment Note    Patient Details  Name: [name] MRN: [id number] Date of Birth: 1945/09/01  Transition [facility]) CM/SW Contact:    Milinda Antis, [place] Phone Number: 03/25/2021, 10:44 AM  Clinical Narrative:                 CSW spoke with [place] with admissions at [facility] and informed the facility that insurance Josem Kaufmann has not been received.    Pending: insurance auth ([facility])  11:38-  CSW received notification that insurance is requesting a peer to peer.  MD notified.   14:00-  CSW notified by attending that the medical director for [facility] has approved SNF.  14:03-  CSW called Gayla Medicus with [employer] at [phone] to request the insurance auth number and to ask if ambulance transport was approved.  There was no answer CSW left a VM requesting a returned call.    15:10-  After not receiving a response from [employer], CSW called Kitty with admissions at [facility] to ask if they patient could admit later today.  The facility is no unable to accept the patient today.  CSW inquired about the patient admitting tomorrow.  The facility does not accept over the weekend.  The patient will now have to admit on Monday.    15:28:  CSW received insurance authorization from [employer].  SNF approval number [id number] authorized for 5 business days; transportation approval number [id number] authorized for 90 days.  Patient's son Nicki Reaper, [phone], notified.  Expected Discharge Plan: [place] Barriers to Discharge: No Barriers Identified   Patient Goals and CMS Choice Patient states their goals for this hospitalization and ongoing recovery are:: Return home CMS Medicare.gov Compare Post Acute Care list provided to:: Patient Represenative (must comment) Choice offered to / list presented to : Spouse, Patient  Expected Discharge Plan and Services Expected Discharge Plan: [place] In-house Referral: Clinical Social Work Discharge  Planning Services: CM Consult Post Acute Care Choice: [place] arrangements for the past 2 months: Single Family Home Expected Discharge Date: 03/25/21                                    Prior Living Arrangements/Services Living arrangements for the past 2 months: Single Family Home Lives with:: Spouse Patient language and need for interpreter reviewed:: Yes Do you feel safe going back to the place where you live?: Yes      Need for Family Participation in Patient Care: Yes (Comment) Care giver support system in place?: Yes (comment) Current home services: DME (WC, walker, BSC) Criminal Activity/Legal Involvement Pertinent to Current Situation/Hospitalization: No - Comment as needed  Activities of Daily Living Home Assistive Devices/Equipment: None ADL Screening (condition at time of admission) Patient's cognitive ability adequate to safely complete daily activities?: Yes Is the patient deaf or have difficulty hearing?: No Does the patient have difficulty seeing, even when wearing glasses/contacts?: Yes Does the patient have difficulty concentrating, remembering, or making decisions?: Yes Patient able to express need for assistance with ADLs?: Yes Does the patient have difficulty dressing or bathing?: No Independently performs ADLs?: No Communication: Independent Dressing (OT): Needs assistance Is this a change from baseline?: Change from baseline, expected to last >3 days Grooming: Needs assistance Is this a change from baseline?: Change from baseline, expected to last >3 days Feeding: Needs assistance Is this a change  from baseline?: Change from baseline, expected to last >3 days Bathing: Needs assistance Is this a change from baseline?: Change from baseline, expected to last >3 days Toileting: Needs assistance Is this a change from baseline?: Change from baseline, expected to last >3days In/Out Bed: Needs assistance Is this a change from baseline?: Change  from baseline, expected to last >3 days Walks in Home: Needs assistance Is this a change from baseline?: Change from baseline, expected to last >3 days Does the patient have difficulty walking or climbing stairs?: Yes Weakness of Legs: Both Weakness of Arms/Hands: None  Permission Sought/Granted Permission sought to share information with : Facility Sport and exercise psychologist, Family Supports Permission granted to share information with : Yes, Verbal Permission Granted  Share Information with NAME: Vickie  Permission granted to share info w AGENCY: [facility] granted to share info w Relationship: Spouse  Permission granted to share info w Contact Information: [phone]  Emotional Assessment Appearance:: Appears stated age Attitude/Demeanor/Rapport: Lethargic Affect (typically observed):  (Confused) Orientation: : Oriented to Self, Oriented to Situation Alcohol / Substance Use: Not Applicable Psych Involvement: No (comment)  Admission diagnosis:  Small bowel obstruction ([place]) [K56.609] Incarcerated inguinal hernia, unilateral [K40.30] Incarcerated inguinal hernia [K40.30] Patient Active Problem List   Diagnosis Date Noted   Incarcerated inguinal hernia 03/18/2021   Orthostatic hypotension    Chronic combined systolic and diastolic congestive heart failure (HCC)    Vascular headache    Pressure injury of skin 03/01/2021   Intraparenchymal hemorrhage of brain ([place]) 02/26/2021   SDH (subdural hematoma) 02/21/2021   Syncope 02/21/2021   Fall 02/21/2021   Hypertensive urgency 02/21/2021   HTN (hypertension) 02/21/2021   Stroke ([place]) 02/21/2021   Chronic combined systolic and diastolic CHF (congestive heart failure) ([place]) 02/21/2021   Leukocytosis 02/21/2021   Elevated troponin 02/21/2021   Zygomatic arch fracture ([place]) 02/21/2021   UTI (urinary tract infection) 02/21/2021   Sepsis ([place]) 02/21/2021   Other spondylosis with radiculopathy, lumbar region 09/04/2018    Centrilobular emphysema ([place]) 07/19/2018   Dilated cardiomyopathy ([place]) 07/19/2018   Coronary artery disease of native artery of native heart with stable angina pectoris ([place]) 01/20/2018   AAA (abdominal aortic aneurysm) without rupture 10/30/2017   Personal history of tobacco use, presenting hazards to health 08/29/2016   COPD exacerbation ([place]) 04/03/2015   Medicare annual wellness visit, subsequent 05/23/2014   Barrett's esophagus 05/20/2014   Constipation 05/20/2014   Arthritis of hand 05/07/2014   COPD (chronic obstructive pulmonary disease) ([place]) 11/06/2011   Pulmonary nodule 11/06/2011   Tobacco abuse 02/12/2011   Tobacco abuse counseling 02/12/2011   CAD, NATIVE VESSEL 10/18/2008   COLONIC POLYPS 08/05/2008   Hyperlipidemia 08/05/2008   Essential hypertension 08/05/2008   GERD 08/05/2008   PCP:  Rusty Aus, MD Pharmacy:   [facility], [place] [address] [place] [address] Phone: [phone] Fax: [phone]  [facility], [place] - [address] [place] [address] Phone: [phone] Fax: [phone]     Social Determinants of Health (SDOH) Interventions    Readmission Risk Interventions No flowsheet data found.

## 2021-03-26 NOTE — Plan of Care (Signed)
  Problem: Education: Goal: Knowledge of General Education information will improve Description: Including pain rating scale, medication(s)/side effects and non-pharmacologic comfort measures Outcome: Progressing   Problem: Health Behavior/Discharge Planning: Goal: Ability to manage health-related needs will improve Outcome: Progressing   Problem: Clinical Measurements: Goal: Respiratory complications will improve Outcome: Progressing   Problem: Activity: Goal: Risk for activity intolerance will decrease Outcome: Progressing   Problem: Nutrition: Goal: Adequate nutrition will be maintained Outcome: Progressing   Problem: Coping: Goal: Level of anxiety will decrease Outcome: Progressing   Problem: Elimination: Goal: Will not experience complications related to bowel motility Outcome: Progressing Goal: Will not experience complications related to urinary retention Outcome: Progressing   Problem: Pain Managment: Goal: General experience of comfort will improve Outcome: Progressing   Problem: Safety: Goal: Ability to remain free from injury will improve Outcome: Progressing   Problem: Skin Integrity: Goal: Risk for impaired skin integrity will decrease Outcome: Progressing   Problem: Skin Integrity: Goal: Demonstration of wound healing without infection will improve Outcome: Progressing

## 2021-03-26 NOTE — Progress Notes (Signed)
PROGRESS NOTE    [name]  QMV:[id number] DOB: January 18, 1946 DOA: 03/18/2021 PCP: Rusty Aus, MD   Brief Narrative:  [name] is a 75 y.o. male with medical history significant of AAA; COPD; CAD; chronic systolic CHF; PAD; HTN; HLD; and prostate CA presenting with n/v.   He was previously admitted to the neurosurgery service from 11/7-12 with SDH and was discharged to [facility] and remained there through 11/30.  He reports that he progressed well while there and had no apparent issues.  However, he arrived home on 11/30 and started vomiting shortly thereafter with any attempt at food/drink intake.   He has noticed a bulge in his left groin.  He was found to have an incarcerated hernia and is going to the OR now.   Of note, his wife has had a complicated hospitalization following hip fracture.  He reports that she was discharged today, after 5 weeks of hospitalization.      Incarcerated inguinal hernia, started vomiting yesterday.  Fall with LOC, intracranial hemorrhage, d/c from CIR on 11/30. Procedure: open left recurrent, strangulated inguinal hernia repair with phasix ST absorbable mesh, exploratory laparotomy, small bowel resection with primary anastomosis, primary repair of umbilical hernia   Pre-op diagnosis: incarcerated left inguinal hernia, reducible umbilical hernia Post-op diagnosis: strangulated left inguinal hernia, incarcerated umbilical hernia, 4cm  Assessment & Plan:   Principal Problem:   Incarcerated inguinal hernia Active Problems:   Hyperlipidemia   Essential hypertension   Tobacco abuse   AAA (abdominal aortic aneurysm) without rupture   Centrilobular emphysema (HCC)   Chronic combined systolic and diastolic CHF (congestive heart failure) (HCC)   Intraparenchymal hemorrhage of brain ([place])  Incarcerated inguinal hernia with small bowel obstruction: S/p surgical repair 03/18/2021.  Tolerating soft diet, passing flatus.  He tells me that he has had a bowel  movement as well but that is not charted.  General surgery has cleared him for discharge.  Chronic combined CHF: Echo March 2022 shows EF of 30 to 35% with grade 1 diastolic dysfunction.  He appears to be well compensated.  Continue Entresto and Aldactone.   Acute thrombocytopenia: Likely due to acute illness.  Resolved.  Essential hypertension: Controlled, continue Coreg and Entresto.  Hyperlipidemia: Continue Lipitor.  Hypokalemia: Resolved.  COPD: Not in exacerbation.  Continue Symbicort and albuterol.  PVD: -AAA, 3.8 cm -s/p aortobi-iliac stent graft, patent -Severe atherosclerotic disease on CT  Fall in the hospital: Per reports, patient had unwitnessed fall late night of 03/21/2021.  Reportedly, he did hit his head on the floor but he did not have any complaints.  He was assessed by night hospitalist.  CT head, lumbar spine as well as rib x-ray was done and no acute fracture was found.  He did not have any complaints when I saw him today but when surgery saw him, he was complaining of left hip pain, they obtained a pelvic ultrasound which ruled out hip fracture or pelvic fracture.  Delirium: Fully alert and oriented.  Continue Seroquel nightly  1. Avoid benzodiazepines, antihistamines, anticholinergics, and minimize opiate use as these may worsen delirium. 2: Assess, prevent and manage pain as lack of treatment can result in delirium.  3: Provide appropriate lighting and clear signage; a clock and calendar should be easily visible to the patient. 4: Monitor environmental factors. Reduce light and noise at night (close shades, turn off lights, turn off TV, ect). Correct any alterations in sleep cycle. 5: Reorient the patient to person, place, time  and situation on each encounter.  6: Correct sensory deficits if possible (replace eye glasses, hearing aids, ect). 7: Avoid restraints if able. Severely delirious patients benefit from constant observation by a sitter.  Placement: Patient  was discharged early morning on 03/25/2021 while we were still waiting for insurance approval.  I was informed at noon by [facility] that insurance was denied and peer-to-peer was required.  I called medical director at noon and left a voicemail.  Received a call back at around 2 PM, after lengthy discussion, medical Director approved patient's SNF placement however authorization number was not provided to [facility] until about 3:30 PM.  TOC was then informed by the facility that it was too late for them to admit the patient and that they do not accept patients on the weekend either so patient will need to be admitted to facility on Monday, 03/28/2021.  DVT prophylaxis: enoxaparin (LOVENOX) injection 40 mg Start: 03/21/21 1015 SCDs Start: 03/18/21 1508   Code Status: DNR  Family Communication:  None present at bedside.  Updated wife over the phone.  Status is: Inpatient  Remains inpatient appropriate because: Planned discharge on Monday.  Estimated body mass index is 23.38 kg/m as calculated from the following:   Height as of this encounter: 6' (1.829 m).   Weight as of this encounter: 78.2 kg.  Pressure Injury 02/26/21 Coccyx Mid;Posterior Stage 2 -  Partial thickness loss of dermis presenting as a shallow open injury with a red, pink wound bed without slough. (Active)  02/26/21 1702  Location: Coccyx  Location Orientation: Mid;Posterior  Staging: Stage 2 -  Partial thickness loss of dermis presenting as a shallow open injury with a red, pink wound bed without slough.  Wound Description (Comments):   Present on Admission: Yes    Nutritional Assessment: Body mass index is 23.38 kg/m.[name] Seen by dietician.  I agree with the assessment and plan as outlined below: Nutrition Status: Nutrition Problem: Increased nutrient needs Etiology: post-op healing Signs/Symptoms: estimated needs    .  Skin Assessment: I have examined the patient's skin and I agree with the wound assessment as performed by the  wound care RN as outlined below: Pressure Injury 02/26/21 Coccyx Mid;Posterior Stage 2 -  Partial thickness loss of dermis presenting as a shallow open injury with a red, pink wound bed without slough. (Active)  02/26/21 1702  Location: Coccyx  Location Orientation: Mid;Posterior  Staging: Stage 2 -  Partial thickness loss of dermis presenting as a shallow open injury with a red, pink wound bed without slough.  Wound Description (Comments):   Present on Admission: Yes    Consultants:  General surgery  Procedures:  As above  Antimicrobials:  Anti-infectives (From admission, onward)    Start     Dose/Rate Route Frequency Ordered Stop   03/19/21 0600  cefoTEtan (CEFOTAN) 1 g in sodium chloride 0.9 % 100 mL IVPB        1 g 200 mL/hr over 30 Minutes Intravenous On call to O.R. 03/18/21 1412 03/19/21 0706   03/18/21 1516  sodium chloride 0.9 % with cefoTEtan (CEFOTAN) ADS Med       Note to Pharmacy: Renda Rolls L: cabinet override      03/18/21 1516 03/19/21 0329          Subjective:  Seen and examined.  He has no complaints.  Granddaughter in law at bedside.  Objective: Vitals:   03/25/21 1807 03/26/21 0509 03/26/21 0821 03/26/21 0951  BP:  (!) 182/74  [name])  155/85  Pulse: 62 (!) 54  72  Resp:  18  16  Temp:  97.6 F (36.4 C)  98.1 F (36.7 C)  TempSrc:  Oral  Oral  SpO2:  (!) 87% 92% 96%  Weight:  78.2 kg    Height:        Intake/Output Summary (Last 24 hours) at 03/26/2021 1303 Last data filed at 03/26/2021 1230 Gross per 24 hour  Intake 261 ml  Output 550 ml  Net -289 ml    Filed Weights   03/23/21 2119 03/25/21 0539 03/26/21 0509  Weight: 78.1 kg 77.1 kg 78.2 kg    Examination:  General exam: Appears calm and comfortable  Respiratory system: Clear to auscultation. Respiratory effort normal. Cardiovascular system: S1 & S2 heard, RRR. No JVD, murmurs, rubs, gallops or clicks. No pedal edema. Gastrointestinal system: Abdomen is nondistended, soft and  nontender. No organomegaly or masses felt. Normal bowel sounds heard. Central nervous system: Alert and oriented. No focal neurological deficits. Extremities: Symmetric 5 x 5 power. Skin: No rashes, lesions or ulcers.  Psychiatry: Judgement and insight appear normal. Mood & affect appropriate.    Data Reviewed: I have personally reviewed following labs and imaging studies  CBC: Recent Labs  Lab 03/20/21 1502 03/21/21 0442 03/22/21 0340 03/23/21 0842 03/25/21 0313  WBC 10.8* 10.9* 9.2 7.0 6.1  NEUTROABS  --   --  7.8* 5.6 4.3  HGB 12.4* 12.3* 12.7* 12.7* 12.8*  HCT 38.1* 36.6* 37.8* 38.7* 38.4*  MCV 97.2 95.8 95.7 97.0 96.0  PLT 121* 124* 143* 182 094    Basic Metabolic Panel: Recent Labs  Lab 03/20/21 1502 03/21/21 0442 03/22/21 0340 03/23/21 0842 03/25/21 0313  NA 135 133* 133* 139 140  K 4.1 3.7 3.3* 4.0 4.1  CL 99 102 98 103 106  CO2 27 25 27 30 25   GLUCOSE 95 126* 117* 92 68*  BUN 19 14 14 15 17   CREATININE 0.91 0.85 0.93 0.90 0.87  CALCIUM 8.1* 8.0* 8.1* 7.9* 7.5*  MG  --   --   --  1.8 1.7    GFR: Estimated Creatinine Clearance: 78 mL/min (by C-G formula based on SCr of 0.87 mg/dL). Liver Function Tests: No results for input(s): AST, ALT, ALKPHOS, BILITOT, PROT, ALBUMIN in the last 168 hours.  No results for input(s): LIPASE, AMYLASE in the last 168 hours.  No results for input(s): AMMONIA in the last 168 hours. Coagulation Profile: No results for input(s): INR, PROTIME in the last 168 hours. Cardiac Enzymes: No results for input(s): CKTOTAL, CKMB, CKMBINDEX, TROPONINI in the last 168 hours. BNP (last 3 results) No results for input(s): PROBNP in the last 8760 hours. HbA1C: No results for input(s): HGBA1C in the last 72 hours. CBG: No results for input(s): GLUCAP in the last 168 hours.  Lipid Profile: No results for input(s): CHOL, HDL, LDLCALC, TRIG, CHOLHDL, LDLDIRECT in the last 72 hours. Thyroid Function Tests: No results for input(s): TSH,  T4TOTAL, FREET4, T3FREE, THYROIDAB in the last 72 hours. Anemia Panel: No results for input(s): VITAMINB12, FOLATE, FERRITIN, TIBC, IRON, RETICCTPCT in the last 72 hours. Sepsis Labs: No results for input(s): PROCALCITON, LATICACIDVEN in the last 168 hours.   Recent Results (from the past 240 hour(s))  Resp Panel by RT-PCR (Flu A&B, Covid) Nasopharyngeal Swab     Status: None   Collection Time: 03/18/21  8:45 AM   Specimen: Nasopharyngeal Swab; Nasopharyngeal(NP) swabs in vial transport medium  Result Value Ref Range Status  SARS Coronavirus 2 by RT PCR NEGATIVE NEGATIVE Final    Comment: (NOTE) SARS-CoV-2 target nucleic acids are NOT DETECTED.  The SARS-CoV-2 RNA is generally detectable in upper respiratory specimens during the acute phase of infection. The lowest concentration of SARS-CoV-2 viral copies this assay can detect is 138 copies/mL. A negative result does not preclude SARS-Cov-2 infection and should not be used as the sole basis for treatment or other patient management decisions. A negative result may occur with  improper specimen collection/handling, submission of specimen other than nasopharyngeal swab, presence of viral mutation(s) within the areas targeted by this assay, and inadequate number of viral copies(<138 copies/mL). A negative result must be combined with clinical observations, patient history, and epidemiological information. The expected result is Negative.  Fact Sheet for Patients:  EntrepreneurPulse.com.au  Fact Sheet for Healthcare Providers:  IncredibleEmployment.be  This test is no t yet approved or cleared by the [place] FDA and  has been authorized for detection and/or diagnosis of SARS-CoV-2 by FDA under an Emergency Use Authorization (EUA). This EUA will remain  in effect (meaning this test can be used) for the duration of the COVID-19 declaration under Section 564(b)(1) of the Act, 21 U.S.C.section  360bbb-3(b)(1), unless the authorization is terminated  or revoked sooner.       Influenza A by PCR NEGATIVE NEGATIVE Final   Influenza B by PCR NEGATIVE NEGATIVE Final    Comment: (NOTE) The Xpert Xpress SARS-CoV-2/FLU/RSV plus assay is intended as an aid in the diagnosis of influenza from Nasopharyngeal swab specimens and should not be used as a sole basis for treatment. Nasal washings and aspirates are unacceptable for Xpert Xpress SARS-CoV-2/FLU/RSV testing.  Fact Sheet for Patients: EntrepreneurPulse.com.au  Fact Sheet for Healthcare Providers: IncredibleEmployment.be  This test is not yet approved or cleared by the [place] FDA and has been authorized for detection and/or diagnosis of SARS-CoV-2 by FDA under an Emergency Use Authorization (EUA). This EUA will remain in effect (meaning this test can be used) for the duration of the COVID-19 declaration under Section 564(b)(1) of the Act, 21 U.S.C. section 360bbb-3(b)(1), unless the authorization is terminated or revoked.  Performed at [facility], [place] [address] [place], [place] [address]   SARS CORONAVIRUS 2 (TAT 6-24 HRS) Nasopharyngeal Nasopharyngeal Swab     Status: None   Collection Time: 03/24/21  1:48 PM   Specimen: Nasopharyngeal Swab  Result Value Ref Range Status   SARS Coronavirus 2 NEGATIVE NEGATIVE Final    Comment: (NOTE) SARS-CoV-2 target nucleic acids are NOT DETECTED.  The SARS-CoV-2 RNA is generally detectable in upper and lower respiratory specimens during the acute phase of infection. Negative results do not preclude SARS-CoV-2 infection, do not rule out co-infections with other pathogens, and should not be used as the sole basis for treatment or other patient management decisions. Negative results must be combined with clinical observations, patient history, and epidemiological information. The expected result is Negative.  Fact Sheet for  Patients: SugarRoll.be  Fact Sheet for Healthcare Providers: https://www.woods-mathews.com/  This test is not yet approved or cleared by the [place] FDA and  has been authorized for detection and/or diagnosis of SARS-CoV-2 by FDA under an Emergency Use Authorization (EUA). This EUA will remain  in effect (meaning this test can be used) for the duration of the COVID-19 declaration under Se ction 564(b)(1) of the Act, 21 U.S.C. section 360bbb-3(b)(1), unless the authorization is terminated or revoked sooner.  Performed at [facility], [address] [place], [place] [address]        Radiology Studies: No results found.  Scheduled Meds:  acetaminophen  1,000 mg Oral Q6H   atorvastatin  80 mg Oral Daily   carvedilol  3.125 mg Oral BID WC   cyclobenzaprine  5 mg Oral QHS   enoxaparin (LOVENOX) injection  40 mg Subcutaneous Q24H   feeding supplement  237 mL Oral TID BM   mometasone-formoterol  2 puff Inhalation BID   multivitamin with minerals  1 tablet Oral Daily   nicotine  21 mg Transdermal Daily   pantoprazole  40 mg Oral Daily   polyethylene glycol  17 g Oral Daily   QUEtiapine  25 mg Oral QHS   sacubitril-valsartan  1 tablet Oral BID   spironolactone  12.5 mg Oral Daily   valproic acid  250 mg Oral BID   Continuous Infusions:    LOS: 8 days   Time spent: 25-minute   Darliss Cheney, MD [facility]  03/26/2021, 1:03 PM  Please page via [facility] and do not message via secure chat for anything urgent. Secure chat can be used for anything non urgent.  How to contact the [facility] Attending or Consulting provider [place] or covering provider during after hours [place], for this patient?  Check the care team in [facility] and look for a) attending/consulting [facility] provider listed and b) the [facility] team listed. Page or secure chat [phone]. Log into [email or web address] and use [place]'s universal password to access. If you do not have  the password, please contact the hospital operator. Locate the [facility] provider you are looking for under [facility] and page to a number that you can be directly reached. If you still have difficulty reaching the provider, please page the [facility] (Director on Call) for the Hospitalists listed on amion for assistance.

## 2021-03-27 NOTE — Progress Notes (Signed)
PROGRESS NOTE    [name]  PYP:[id number] DOB: Dec 17, 1945 DOA: 03/18/2021 PCP: Rusty Aus, MD   Brief Narrative:  [name] is a 75 y.o. male with medical history significant of AAA; COPD; CAD; chronic systolic CHF; PAD; HTN; HLD; and prostate CA presenting with n/v.   He was previously admitted to the neurosurgery service from 11/7-12 with SDH and was discharged to [facility] and remained there through 11/30.  He reports that he progressed well while there and had no apparent issues.  However, he arrived home on 11/30 and started vomiting shortly thereafter with any attempt at food/drink intake.   He has noticed a bulge in his left groin.  He was found to have an incarcerated hernia and is going to the OR now.   Of note, his wife has had a complicated hospitalization following hip fracture.  He reports that she was discharged today, after 5 weeks of hospitalization.      Incarcerated inguinal hernia, started vomiting yesterday.  Fall with LOC, intracranial hemorrhage, d/c from CIR on 11/30. Procedure: open left recurrent, strangulated inguinal hernia repair with phasix ST absorbable mesh, exploratory laparotomy, small bowel resection with primary anastomosis, primary repair of umbilical hernia   Pre-op diagnosis: incarcerated left inguinal hernia, reducible umbilical hernia Post-op diagnosis: strangulated left inguinal hernia, incarcerated umbilical hernia, 4cm  Assessment & Plan:   Principal Problem:   Incarcerated inguinal hernia Active Problems:   Hyperlipidemia   Essential hypertension   Tobacco abuse   AAA (abdominal aortic aneurysm) without rupture   Centrilobular emphysema (HCC)   Chronic combined systolic and diastolic CHF (congestive heart failure) (HCC)   Intraparenchymal hemorrhage of brain ([place])  Incarcerated inguinal hernia with small bowel obstruction: S/p surgical repair 03/18/2021.  Tolerating soft diet, passing flatus.  He tells me that he has had a bowel  movement as well but that is not charted.  General surgery has cleared him for discharge.  Chronic combined CHF: Echo March 2022 shows EF of 30 to 35% with grade 1 diastolic dysfunction.  He appears to be well compensated.  Continue Entresto and Aldactone.   Acute thrombocytopenia: Likely due to acute illness.  Resolved.  Essential hypertension: Controlled, continue Coreg and Entresto.  Hyperlipidemia: Continue Lipitor.  Hypokalemia: Resolved.  COPD: Not in exacerbation.  Continue Symbicort and albuterol.  PVD: -AAA, 3.8 cm -s/p aortobi-iliac stent graft, patent -Severe atherosclerotic disease on CT  Fall in the hospital: Per reports, patient had unwitnessed fall late night of 03/21/2021.  Reportedly, he did hit his head on the floor but he did not have any complaints.  He was assessed by night hospitalist.  CT head, lumbar spine as well as rib x-ray was done and no acute fracture was found.  He did not have any complaints when I saw him today but when surgery saw him, he was complaining of left hip pain, they obtained a pelvic ultrasound which ruled out hip fracture or pelvic fracture.  Delirium: Fully alert and oriented.  Continue Seroquel nightly  1. Avoid benzodiazepines, antihistamines, anticholinergics, and minimize opiate use as these may worsen delirium. 2: Assess, prevent and manage pain as lack of treatment can result in delirium.  3: Provide appropriate lighting and clear signage; a clock and calendar should be easily visible to the patient. 4: Monitor environmental factors. Reduce light and noise at night (close shades, turn off lights, turn off TV, ect). Correct any alterations in sleep cycle. 5: Reorient the patient to person, place, time  and situation on each encounter.  6: Correct sensory deficits if possible (replace eye glasses, hearing aids, ect). 7: Avoid restraints if able. Severely delirious patients benefit from constant observation by a sitter.  Placement: Patient  was discharged early morning on 03/25/2021 while we were still waiting for insurance approval.  I was informed at noon by [facility] that insurance was denied and peer-to-peer was required.  I called medical director at noon and left a voicemail.  Received a call back at around 2 PM, after lengthy discussion, medical Director approved patient's SNF placement however authorization number was not provided to [facility] until about 3:30 PM.  TOC was then informed by the facility that it was too late for them to admit the patient and that they do not accept patients on the weekend either so patient will need to be admitted to facility on Monday, 03/28/2021.  DVT prophylaxis: enoxaparin (LOVENOX) injection 40 mg Start: 03/21/21 1015 SCDs Start: 03/18/21 1508   Code Status: DNR  Family Communication:  None present at bedside.  Updated wife over the phone.  Status is: Inpatient  Remains inpatient appropriate because: Planned discharge on Monday.  Estimated body mass index is 23.38 kg/m as calculated from the following:   Height as of this encounter: 6' (1.829 m).   Weight as of this encounter: 78.2 kg.  Pressure Injury 02/26/21 Coccyx Mid;Posterior Stage 2 -  Partial thickness loss of dermis presenting as a shallow open injury with a red, pink wound bed without slough. (Active)  02/26/21 1702  Location: Coccyx  Location Orientation: Mid;Posterior  Staging: Stage 2 -  Partial thickness loss of dermis presenting as a shallow open injury with a red, pink wound bed without slough.  Wound Description (Comments):   Present on Admission: Yes    Nutritional Assessment: Body mass index is 23.38 kg/m.[name] Seen by dietician.  I agree with the assessment and plan as outlined below: Nutrition Status: Nutrition Problem: Increased nutrient needs Etiology: post-op healing Signs/Symptoms: estimated needs    .  Skin Assessment: I have examined the patient's skin and I agree with the wound assessment as performed by the  wound care RN as outlined below: Pressure Injury 02/26/21 Coccyx Mid;Posterior Stage 2 -  Partial thickness loss of dermis presenting as a shallow open injury with a red, pink wound bed without slough. (Active)  02/26/21 1702  Location: Coccyx  Location Orientation: Mid;Posterior  Staging: Stage 2 -  Partial thickness loss of dermis presenting as a shallow open injury with a red, pink wound bed without slough.  Wound Description (Comments):   Present on Admission: Yes    Consultants:  General surgery  Procedures:  As above  Antimicrobials:  Anti-infectives (From admission, onward)    Start     Dose/Rate Route Frequency Ordered Stop   03/19/21 0600  cefoTEtan (CEFOTAN) 1 g in sodium chloride 0.9 % 100 mL IVPB        1 g 200 mL/hr over 30 Minutes Intravenous On call to O.R. 03/18/21 1412 03/19/21 0706   03/18/21 1516  sodium chloride 0.9 % with cefoTEtan (CEFOTAN) ADS Med       Note to Pharmacy: Renda Rolls L: cabinet override      03/18/21 1516 03/19/21 0329          Subjective:  Seen and examined.  Fully alert and oriented.  No complaints.  Objective: Vitals:   03/27/21 0049 03/27/21 0549 03/27/21 0817 03/27/21 0913  BP:  (!) 161/73  (!) 150/82  Pulse:  (!) 55  63  Resp:  18  18  Temp:  (!) 97.5 F (36.4 C)  98 F (36.7 C)  TempSrc:  Oral  Oral  SpO2: 92% 98% 98% 96%  Weight:      Height:        Intake/Output Summary (Last 24 hours) at 03/27/2021 1232 Last data filed at 03/27/2021 0900 Gross per 24 hour  Intake 627 ml  Output 650 ml  Net -23 ml    Filed Weights   03/23/21 2119 03/25/21 0539 03/26/21 0509  Weight: 78.1 kg 77.1 kg 78.2 kg    Examination:  General exam: Appears calm and comfortable  Respiratory system: Clear to auscultation. Respiratory effort normal. Cardiovascular system: S1 & S2 heard, RRR. No JVD, murmurs, rubs, gallops or clicks. No pedal edema. Gastrointestinal system: Abdomen is nondistended, soft and nontender. No  organomegaly or masses felt. Normal bowel sounds heard. Central nervous system: Alert and oriented. No focal neurological deficits. Extremities: Symmetric 5 x 5 power. Skin: No rashes, lesions or ulcers.    Data Reviewed: I have personally reviewed following labs and imaging studies  CBC: Recent Labs  Lab 03/20/21 1502 03/21/21 0442 03/22/21 0340 03/23/21 0842 03/25/21 0313  WBC 10.8* 10.9* 9.2 7.0 6.1  NEUTROABS  --   --  7.8* 5.6 4.3  HGB 12.4* 12.3* 12.7* 12.7* 12.8*  HCT 38.1* 36.6* 37.8* 38.7* 38.4*  MCV 97.2 95.8 95.7 97.0 96.0  PLT 121* 124* 143* 182 850    Basic Metabolic Panel: Recent Labs  Lab 03/20/21 1502 03/21/21 0442 03/22/21 0340 03/23/21 0842 03/25/21 0313  NA 135 133* 133* 139 140  K 4.1 3.7 3.3* 4.0 4.1  CL 99 102 98 103 106  CO2 27 25 27 30 25   GLUCOSE 95 126* 117* 92 68*  BUN 19 14 14 15 17   CREATININE 0.91 0.85 0.93 0.90 0.87  CALCIUM 8.1* 8.0* 8.1* 7.9* 7.5*  MG  --   --   --  1.8 1.7    GFR: Estimated Creatinine Clearance: 78 mL/min (by C-G formula based on SCr of 0.87 mg/dL). Liver Function Tests: No results for input(s): AST, ALT, ALKPHOS, BILITOT, PROT, ALBUMIN in the last 168 hours.  No results for input(s): LIPASE, AMYLASE in the last 168 hours.  No results for input(s): AMMONIA in the last 168 hours. Coagulation Profile: No results for input(s): INR, PROTIME in the last 168 hours. Cardiac Enzymes: No results for input(s): CKTOTAL, CKMB, CKMBINDEX, TROPONINI in the last 168 hours. BNP (last 3 results) No results for input(s): PROBNP in the last 8760 hours. HbA1C: No results for input(s): HGBA1C in the last 72 hours. CBG: No results for input(s): GLUCAP in the last 168 hours.  Lipid Profile: No results for input(s): CHOL, HDL, LDLCALC, TRIG, CHOLHDL, LDLDIRECT in the last 72 hours. Thyroid Function Tests: No results for input(s): TSH, T4TOTAL, FREET4, T3FREE, THYROIDAB in the last 72 hours. Anemia Panel: No results for  input(s): VITAMINB12, FOLATE, FERRITIN, TIBC, IRON, RETICCTPCT in the last 72 hours. Sepsis Labs: No results for input(s): PROCALCITON, LATICACIDVEN in the last 168 hours.   Recent Results (from the past 240 hour(s))  Resp Panel by RT-PCR (Flu A&B, Covid) Nasopharyngeal Swab     Status: None   Collection Time: 03/18/21  8:45 AM   Specimen: Nasopharyngeal Swab; Nasopharyngeal(NP) swabs in vial transport medium  Result Value Ref Range Status   SARS Coronavirus 2 by RT PCR NEGATIVE NEGATIVE Final    Comment: (NOTE)  SARS-CoV-2 target nucleic acids are NOT DETECTED.  The SARS-CoV-2 RNA is generally detectable in upper respiratory specimens during the acute phase of infection. The lowest concentration of SARS-CoV-2 viral copies this assay can detect is 138 copies/mL. A negative result does not preclude SARS-Cov-2 infection and should not be used as the sole basis for treatment or other patient management decisions. A negative result may occur with  improper specimen collection/handling, submission of specimen other than nasopharyngeal swab, presence of viral mutation(s) within the areas targeted by this assay, and inadequate number of viral copies(<138 copies/mL). A negative result must be combined with clinical observations, patient history, and epidemiological information. The expected result is Negative.  Fact Sheet for Patients:  EntrepreneurPulse.com.au  Fact Sheet for Healthcare Providers:  IncredibleEmployment.be  This test is no t yet approved or cleared by the [place] FDA and  has been authorized for detection and/or diagnosis of SARS-CoV-2 by FDA under an Emergency Use Authorization (EUA). This EUA will remain  in effect (meaning this test can be used) for the duration of the COVID-19 declaration under Section 564(b)(1) of the Act, 21 U.S.C.section 360bbb-3(b)(1), unless the authorization is terminated  or revoked sooner.        Influenza A by PCR NEGATIVE NEGATIVE Final   Influenza B by PCR NEGATIVE NEGATIVE Final    Comment: (NOTE) The Xpert Xpress SARS-CoV-2/FLU/RSV plus assay is intended as an aid in the diagnosis of influenza from Nasopharyngeal swab specimens and should not be used as a sole basis for treatment. Nasal washings and aspirates are unacceptable for Xpert Xpress SARS-CoV-2/FLU/RSV testing.  Fact Sheet for Patients: EntrepreneurPulse.com.au  Fact Sheet for Healthcare Providers: IncredibleEmployment.be  This test is not yet approved or cleared by the [place] FDA and has been authorized for detection and/or diagnosis of SARS-CoV-2 by FDA under an Emergency Use Authorization (EUA). This EUA will remain in effect (meaning this test can be used) for the duration of the COVID-19 declaration under Section 564(b)(1) of the Act, 21 U.S.C. section 360bbb-3(b)(1), unless the authorization is terminated or revoked.  Performed at [facility], [place] [address] [place], [place] [address]   SARS CORONAVIRUS 2 (TAT 6-24 HRS) Nasopharyngeal Nasopharyngeal Swab     Status: None   Collection Time: 03/24/21  1:48 PM   Specimen: Nasopharyngeal Swab  Result Value Ref Range Status   SARS Coronavirus 2 NEGATIVE NEGATIVE Final    Comment: (NOTE) SARS-CoV-2 target nucleic acids are NOT DETECTED.  The SARS-CoV-2 RNA is generally detectable in upper and lower respiratory specimens during the acute phase of infection. Negative results do not preclude SARS-CoV-2 infection, do not rule out co-infections with other pathogens, and should not be used as the sole basis for treatment or other patient management decisions. Negative results must be combined with clinical observations, patient history, and epidemiological information. The expected result is Negative.  Fact Sheet for Patients: SugarRoll.be  Fact Sheet for Healthcare  Providers: https://www.woods-mathews.com/  This test is not yet approved or cleared by the [place] FDA and  has been authorized for detection and/or diagnosis of SARS-CoV-2 by FDA under an Emergency Use Authorization (EUA). This EUA will remain  in effect (meaning this test can be used) for the duration of the COVID-19 declaration under Se ction 564(b)(1) of the Act, 21 U.S.C. section 360bbb-3(b)(1), unless the authorization is terminated or revoked sooner.  Performed at [facility], [place] [address], [place], [place] [address]        Radiology  Studies: No results found.  Scheduled Meds:  acetaminophen  1,000 mg Oral Q6H   atorvastatin  80 mg Oral Daily   carvedilol  3.125 mg Oral BID WC   cyclobenzaprine  5 mg Oral QHS   enoxaparin (LOVENOX) injection  40 mg Subcutaneous Q24H   feeding supplement  237 mL Oral TID BM   mometasone-formoterol  2 puff Inhalation BID   multivitamin with minerals  1 tablet Oral Daily   nicotine  21 mg Transdermal Daily   pantoprazole  40 mg Oral Daily   polyethylene glycol  17 g Oral Daily   QUEtiapine  25 mg Oral QHS   sacubitril-valsartan  1 tablet Oral BID   spironolactone  12.5 mg Oral Daily   valproic acid  250 mg Oral BID   Continuous Infusions:    LOS: 9 days   Time spent: 21-minute   Darliss Cheney, MD [facility]  03/27/2021, 12:32 PM  Please page via [place] and do not message via secure chat for anything urgent. Secure chat can be used for anything non urgent.  How to contact the [facility] Attending or Consulting provider [place] or covering provider during after hours [place], for this patient?  Check the care team in [facility] and look for a) attending/consulting TRH provider listed and b) the [facility] team listed. Page or secure chat [phone]. Log into [email or web address] and use [place]'s universal password to access. If you do not have the password, please contact the hospital operator. Locate the [facility] provider you  are looking for under [facility] and page to a number that you can be directly reached. If you still have difficulty reaching the provider, please page the [facility] (Director on Call) for the Hospitalists listed on amion for assistance.

## 2021-03-27 NOTE — Plan of Care (Signed)
  Problem: Education: Goal: Knowledge of General Education information will improve Description: Including pain rating scale, medication(s)/side effects and non-pharmacologic comfort measures Outcome: Not Progressing   Problem: Health Behavior/Discharge Planning: Goal: Ability to manage health-related needs will improve Outcome: Not Progressing   Problem: Clinical Measurements: Goal: Ability to maintain clinical measurements within normal limits will improve Outcome: Not Progressing Goal: Will remain free from infection Outcome: Not Progressing Goal: Diagnostic test results will improve Outcome: Not Progressing Goal: Respiratory complications will improve Outcome: Not Progressing Goal: Cardiovascular complication will be avoided Outcome: Not Progressing   Problem: Activity: Goal: Risk for activity intolerance will decrease Outcome: Not Progressing   Problem: Nutrition: Goal: Adequate nutrition will be maintained Outcome: Not Progressing   Problem: Coping: Goal: Level of anxiety will decrease Outcome: Not Progressing   Problem: Elimination: Goal: Will not experience complications related to bowel motility Outcome: Not Progressing Goal: Will not experience complications related to urinary retention Outcome: Not Progressing   Problem: Pain Managment: Goal: General experience of comfort will improve Outcome: Not Progressing   Problem: Safety: Goal: Ability to remain free from injury will improve Outcome: Not Progressing   Problem: Skin Integrity: Goal: Risk for impaired skin integrity will decrease Outcome: Not Progressing   Problem: Education: Goal: Required Educational Video(s) Outcome: Not Progressing   Problem: Clinical Measurements: Goal: Ability to maintain clinical measurements within normal limits will improve Outcome: Not Progressing Goal: Postoperative complications will be avoided or minimized Outcome: Not Progressing   Problem: Skin  Integrity: Goal: Demonstration of wound healing without infection will improve Outcome: Not Progressing

## 2021-03-28 DIAGNOSIS — Z743 Need for continuous supervision: Secondary | ICD-10-CM | POA: Diagnosis not present

## 2021-03-28 DIAGNOSIS — Z720 Tobacco use: Secondary | ICD-10-CM | POA: Diagnosis not present

## 2021-03-28 DIAGNOSIS — I619 Nontraumatic intracerebral hemorrhage, unspecified: Secondary | ICD-10-CM | POA: Diagnosis not present

## 2021-03-28 DIAGNOSIS — R2681 Unsteadiness on feet: Secondary | ICD-10-CM | POA: Diagnosis not present

## 2021-03-28 DIAGNOSIS — R1312 Dysphagia, oropharyngeal phase: Secondary | ICD-10-CM | POA: Diagnosis not present

## 2021-03-28 DIAGNOSIS — K4030 Unilateral inguinal hernia, with obstruction, without gangrene, not specified as recurrent: Secondary | ICD-10-CM | POA: Diagnosis not present

## 2021-03-28 DIAGNOSIS — G441 Vascular headache, not elsewhere classified: Secondary | ICD-10-CM | POA: Diagnosis not present

## 2021-03-28 DIAGNOSIS — I639 Cerebral infarction, unspecified: Secondary | ICD-10-CM | POA: Diagnosis not present

## 2021-03-28 DIAGNOSIS — J449 Chronic obstructive pulmonary disease, unspecified: Secondary | ICD-10-CM | POA: Diagnosis not present

## 2021-03-28 DIAGNOSIS — M6281 Muscle weakness (generalized): Secondary | ICD-10-CM | POA: Diagnosis not present

## 2021-03-28 DIAGNOSIS — I10 Essential (primary) hypertension: Secondary | ICD-10-CM | POA: Diagnosis not present

## 2021-03-28 DIAGNOSIS — J432 Centrilobular emphysema: Secondary | ICD-10-CM | POA: Diagnosis not present

## 2021-03-28 DIAGNOSIS — J439 Emphysema, unspecified: Secondary | ICD-10-CM | POA: Diagnosis not present

## 2021-03-28 DIAGNOSIS — I5042 Chronic combined systolic (congestive) and diastolic (congestive) heart failure: Secondary | ICD-10-CM | POA: Diagnosis not present

## 2021-03-28 DIAGNOSIS — R4182 Altered mental status, unspecified: Secondary | ICD-10-CM | POA: Diagnosis not present

## 2021-03-28 DIAGNOSIS — R410 Disorientation, unspecified: Secondary | ICD-10-CM | POA: Diagnosis not present

## 2021-03-28 DIAGNOSIS — R000 Tachycardia, unspecified: Secondary | ICD-10-CM | POA: Diagnosis not present

## 2021-03-28 DIAGNOSIS — J189 Pneumonia, unspecified organism: Secondary | ICD-10-CM | POA: Diagnosis not present

## 2021-03-28 DIAGNOSIS — J9601 Acute respiratory failure with hypoxia: Secondary | ICD-10-CM | POA: Diagnosis not present

## 2021-03-28 DIAGNOSIS — I69391 Dysphagia following cerebral infarction: Secondary | ICD-10-CM | POA: Diagnosis not present

## 2021-03-28 DIAGNOSIS — K460 Unspecified abdominal hernia with obstruction, without gangrene: Secondary | ICD-10-CM | POA: Diagnosis not present

## 2021-03-28 DIAGNOSIS — S065XAA Traumatic subdural hemorrhage with loss of consciousness status unknown, initial encounter: Secondary | ICD-10-CM | POA: Diagnosis not present

## 2021-03-28 DIAGNOSIS — K56609 Unspecified intestinal obstruction, unspecified as to partial versus complete obstruction: Secondary | ICD-10-CM | POA: Diagnosis not present

## 2021-03-28 LAB — BASIC METABOLIC PANEL
Anion gap: 5 (ref 5–15)
BUN: 9 mg/dL (ref 8–23)
CO2: 29 mmol/L (ref 22–32)
Calcium: 7.7 mg/dL — ABNORMAL LOW (ref 8.9–10.3)
Chloride: 107 mmol/L (ref 98–111)
Creatinine, Ser: 0.87 mg/dL (ref 0.61–1.24)
GFR, Estimated: >60 mL/min (ref >60)
Glucose, Bld: 105 mg/dL — ABNORMAL HIGH (ref 70–99)
Potassium: 3.8 mmol/L (ref 3.5–5.1)
Sodium: 141 mmol/L (ref 135–145)

## 2021-03-28 LAB — SARS CORONAVIRUS 2 (TAT 6-24 HRS): SARS Coronavirus 2: NEGATIVE

## 2021-03-28 LAB — GLUCOSE, CAPILLARY: Glucose-Capillary: 91 mg/dL (ref 70–99)

## 2021-03-28 NOTE — Plan of Care (Signed)
  Problem: Nutrition: Goal: Adequate nutrition will be maintained Outcome: Progressing   Problem: Elimination: Goal: Will not experience complications related to urinary retention Outcome: Progressing   

## 2021-03-28 NOTE — Plan of Care (Signed)
  Problem: Clinical Measurements: Goal: Will remain free from infection Outcome: Not Progressing   Problem: Clinical Measurements: Goal: Respiratory complications will improve Outcome: Not Progressing   Problem: Elimination: Goal: Will not experience complications related to bowel motility Outcome: Not Progressing   Problem: Pain Managment: Goal: General experience of comfort will improve Outcome: Not Progressing

## 2021-03-28 NOTE — Progress Notes (Signed)
Physical Therapy Treatment Patient Details Name: [name] MRN: [id number] DOB: Jun 12, 1945 Today's Date: 03/28/2021   History of Present Illness 75 y/o male presenting w/ N/V due to incarcerated inguinal hernia 12/2 s/p OR same date. Pt with recent D/C 11/30 from CIR after SDH. PMHx: prostate CA, AAA, COPD, CAD, CVA, HTN, HLD, CHF    PT Comments    Pt progressing well towards his physical therapy goals; more alert and interactive today. No hallucinations reported per patient. Pt ambulating 60 feet with a walker at a min assist level. Continues with impaired balance, decreased activity tolerance, weakness and decreased cognition. Continue to recommend SNF for ongoing Physical Therapy.      Recommendations for follow up therapy are one component of a multi-disciplinary discharge planning process, led by the attending physician.  Recommendations may be updated based on patient status, additional functional criteria and insurance authorization.  Follow Up Recommendations  Skilled nursing-short term rehab (<3 hours/day)     Assistance Recommended at Discharge Frequent or constant Supervision/Assistance  Equipment Recommendations  BSC/3in1    Recommendations for Other Services       Precautions / Restrictions Precautions Precautions: Fall Precaution Comments: watch O2 Restrictions Weight Bearing Restrictions: No     Mobility  Bed Mobility Overal bed mobility: Needs Assistance Bed Mobility: Supine to Sit     Supine to sit: Min assist     General bed mobility comments: use of bed pad to scoot hips forward, cues for use of rail    Transfers Overall transfer level: Needs assistance Equipment used: Rolling walker (2 wheels) Transfers: Sit to/from Stand Sit to Stand: Min assist           General transfer comment: assist to power up and steady, pt self cueing for hand placement    Ambulation/Gait Ambulation/Gait assistance: Min assist Gait Distance (Feet): 60  Feet Assistive device: Rolling walker (2 wheels) Gait Pattern/deviations: Step-through pattern;Decreased stride length;Trunk flexed Gait velocity: decreased Gait velocity interpretation: <1.8 ft/sec, indicate of risk for recurrent falls   General Gait Details: MinA for balance, intermittent assist for steering RW, mod cues for environmental navigation   Stairs             Wheelchair Mobility    Modified Rankin (Stroke Patients Only)       Balance Overall balance assessment: Needs assistance Sitting-balance support: Single extremity supported;No upper extremity supported;Feet supported Sitting balance-Leahy Scale: Fair     Standing balance support: Bilateral upper extremity supported;During functional activity Standing balance-Leahy Scale: Poor Standing balance comment: reliant on AD and external support                            Cognition Arousal/Alertness: Awake/alert Behavior During Therapy: Flat affect Overall Cognitive Status: No family/caregiver present to determine baseline cognitive functioning Area of Impairment: Safety/judgement;Awareness;Problem solving                         Safety/Judgement: Decreased awareness of safety;Decreased awareness of deficits Awareness: Intellectual Problem Solving: Slow processing;Requires verbal cues;Difficulty sequencing General Comments: Seems more alert today, continues to require mod-max cues for sequencing and problem solving        Exercises      General Comments General comments (skin integrity, edema, etc.): SpO2 89% on 2L O2      Pertinent Vitals/Pain Pain Assessment: Faces Faces Pain Scale: No hurt    Home Living  Prior Function            PT Goals (current goals can now be found in the care plan section) Acute Rehab PT Goals Patient Stated Goal: "get better" Potential to Achieve Goals: Fair Progress towards PT goals: Progressing toward  goals    Frequency    Min 3X/week      PT Plan Current plan remains appropriate    Co-evaluation              AM-PAC PT "6 Clicks" Mobility   Outcome Measure  Help needed turning from your back to your side while in a flat bed without using bedrails?: A Little Help needed moving from lying on your back to sitting on the side of a flat bed without using bedrails?: A Little Help needed moving to and from a bed to a chair (including a wheelchair)?: A Little Help needed standing up from a chair using your arms (e.g., wheelchair or bedside chair)?: A Little Help needed to walk in hospital room?: A Lot Help needed climbing 3-5 steps with a railing? : Total 6 Click Score: 15    End of Session Equipment Utilized During Treatment: Gait belt;Oxygen Activity Tolerance: Patient tolerated treatment well Patient left: in chair;with call bell/phone within reach;with chair alarm set Nurse Communication: Mobility status PT Visit Diagnosis: Muscle weakness (generalized) (M62.81);Difficulty in walking, not elsewhere classified (R26.2);Unsteadiness on feet (R26.81)     Time: 5465-0354 PT Time Calculation (min) (ACUTE ONLY): 30 min  Charges:  $Therapeutic Activity: 23-37 mins                     Wyona Almas, PT, DPT Acute Rehabilitation Services Pager [phone] Office [phone]    Deno Etienne 03/28/2021, 12:29 PM

## 2021-03-28 NOTE — Progress Notes (Addendum)
Tried to call report to [facility]  for room 303. Spoke to [facility]

## 2021-03-28 NOTE — TOC Transition Note (Signed)
Transition of Care [facility]) - CM/SW Discharge Note   Patient Details  Name: [name] MRN: [id number] Date of Birth: 1945-06-05  Transition of Care [facility]) CM/SW Contact:  Milinda Antis, [place] Phone Number: 03/28/2021, 12:30 PM   Clinical Narrative:    Patient will DC to: [facility] Anticipated DC date: 03/28/2021 Family notified: Yes Transport by: Corey Harold   Per MD patient ready for DC to SNF. RN to call report prior to discharge [phone] roo [phone]. RN, patient, patient's family, and facility notified of DC. Discharge Summary and FL2 sent to facility. DC packet on chart. Ambulance transport requested for patient.   CSW will sign off for now as social work intervention is no longer needed. Please consult [place] again if new needs arise.     Final next level of care: [place] Barriers to Discharge: No Barriers Identified   Patient Goals and CMS Choice Patient states their goals for this hospitalization and ongoing recovery are:: Return home CMS Medicare.gov Compare Post Acute Care list provided to:: Patient Represenative (must comment) Choice offered to / list presented to : Spouse, Patient  Discharge Placement                       Discharge Plan and Services In-house Referral: Clinical Social Work Discharge Planning Services: CM Consult Post Acute Care Choice: Home Health                               Social Determinants of Health (SDOH) Interventions     Readmission Risk Interventions No flowsheet data found.

## 2021-03-28 NOTE — TOC Progression Note (Signed)
Transition of Care [facility]) - Initial/Assessment Note    Patient Details  Name: [name] MRN: [id number] Date of Birth: [birth year]  Transition of Care [facility]) CM/SW Contact:    Milinda Antis, LCSWA Phone Number: 03/28/2021, 11:25 AM  Clinical Narrative:                 09:00-  CSW contacted Kitty with admissions at [facility].  CSW was informed that the patient could d/c to the facility today.  The facility will notifify CSW of patient's room number after morning meeting.  09:45-  CSW contacted the patient's wife, Olegario Shearer, after being notified by the nursing staff that the spouse requested to speak with CSW.  CSW gave the spouse the contact information for [facility] so that the family could inquire Medicaid for the patient.  CSW also informed the wife that he plan is for the patient to d/c to the SNF today.    10:30- CSW notified [facility], that the discharge summary has been faxed to the facility.  Pending: Facility's confirmation that everything is set up for the patient to admit to the facility.    Expected Discharge Plan: [place] Barriers to Discharge: No Barriers Identified   Patient Goals and CMS Choice Patient states their goals for this hospitalization and ongoing recovery are:: Return home CMS Medicare.gov Compare Post Acute Care list provided to:: Patient Represenative (must comment) Choice offered to / list presented to : Spouse, Patient  Expected Discharge Plan and Services Expected Discharge Plan: [place] In-house Referral: Clinical Social Work Discharge Planning Services: CM Consult Post Acute Care Choice: [place] arrangements for the past 2 months: Single Family Home Expected Discharge Date: 03/28/21                                    Prior Living Arrangements/Services Living arrangements for the past 2 months: Single Family Home Lives with:: Spouse Patient language and need for  interpreter reviewed:: Yes Do you feel safe going back to the place where you live?: Yes      Need for Family Participation in Patient Care: Yes (Comment) Care giver support system in place?: Yes (comment) Current home services: DME (WC, walker, BSC) Criminal Activity/Legal Involvement Pertinent to Current Situation/Hospitalization: No - Comment as needed  Activities of Daily Living Home Assistive Devices/Equipment: None ADL Screening (condition at time of admission) Patient's cognitive ability adequate to safely complete daily activities?: Yes Is the patient deaf or have difficulty hearing?: No Does the patient have difficulty seeing, even when wearing glasses/contacts?: Yes Does the patient have difficulty concentrating, remembering, or making decisions?: Yes Patient able to express need for assistance with ADLs?: Yes Does the patient have difficulty dressing or bathing?: No Independently performs ADLs?: No Communication: Independent Dressing (OT): Needs assistance Is this a change from baseline?: Change from baseline, expected to last >3 days Grooming: Needs assistance Is this a change from baseline?: Change from baseline, expected to last >3 days Feeding: Needs assistance Is this a change from baseline?: Change from baseline, expected to last >3 days Bathing: Needs assistance Is this a change from baseline?: Change from baseline, expected to last >3 days Toileting: Needs assistance Is this a change from baseline?: Change from baseline, expected to last >3days In/Out Bed: Needs assistance Is this a change from baseline?: Change from baseline, expected to last >3 days [employer]  in Home: Needs assistance Is this a change from baseline?: Change from baseline, expected to last >3 days Does the patient have difficulty walking or climbing stairs?: Yes Weakness of Legs: Both Weakness of Arms/Hands: None  Permission Sought/Granted Permission sought to share information with : Facility  Sport and exercise psychologist, Family Supports Permission granted to share information with : Yes, Verbal Permission Granted  Share Information with NAME: Vickie  Permission granted to share info w AGENCY: [place] granted to share info w Relationship: Spouse  Permission granted to share info w Contact Information: [phone]  Emotional Assessment Appearance:: Appears stated age Attitude/Demeanor/Rapport: Lethargic Affect (typically observed):  (Confused) Orientation: : Oriented to Self, Oriented to Situation Alcohol / Substance Use: Not Applicable Psych Involvement: No (comment)  Admission diagnosis:  Small bowel obstruction ([place]) [K56.609] Incarcerated inguinal hernia, unilateral [K40.30] Incarcerated inguinal hernia [K40.30] Patient Active Problem List   Diagnosis Date Noted   Incarcerated inguinal hernia 03/18/2021   Orthostatic hypotension    Chronic combined systolic and diastolic congestive heart failure (HCC)    Vascular headache    Pressure injury of skin 03/01/2021   Intraparenchymal hemorrhage of brain ([place]) 02/26/2021   SDH (subdural hematoma) 02/21/2021   Syncope 02/21/2021   Fall 02/21/2021   Hypertensive urgency 02/21/2021   HTN (hypertension) 02/21/2021   Stroke ([place]) 02/21/2021   Chronic combined systolic and diastolic CHF (congestive heart failure) ([place]) 02/21/2021   Leukocytosis 02/21/2021   Elevated troponin 02/21/2021   Zygomatic arch fracture ([place]) 02/21/2021   UTI (urinary tract infection) 02/21/2021   Sepsis ([place]) 02/21/2021   Other spondylosis with radiculopathy, lumbar region 09/04/2018   Centrilobular emphysema ([place]) 07/19/2018   Dilated cardiomyopathy ([place]) 07/19/2018   Coronary artery disease of native artery of native heart with stable angina pectoris ([place]) 01/20/2018   AAA (abdominal aortic aneurysm) without rupture 10/30/2017   Personal history of tobacco use, presenting hazards to health 08/29/2016   COPD exacerbation ([place]) 04/03/2015    Medicare annual wellness visit, subsequent 05/23/2014   Barrett's esophagus 05/20/2014   Constipation 05/20/2014   Arthritis of hand 05/07/2014   COPD (chronic obstructive pulmonary disease) ([place]) 11/06/2011   Pulmonary nodule 11/06/2011   Tobacco abuse 02/12/2011   Tobacco abuse counseling 02/12/2011   CAD, NATIVE VESSEL 10/18/2008   COLONIC POLYPS 08/05/2008   Hyperlipidemia 08/05/2008   Essential hypertension 08/05/2008   GERD 08/05/2008   PCP:  Rusty Aus, MD Pharmacy:   [facility], [place] [address] [place] [address] Phone: [phone] Fax: [phone]  [facility], [place] - [address] [place] [address] Phone: [phone] Fax: [phone]     Social Determinants of Health (SDOH) Interventions    Readmission Risk Interventions No flowsheet data found.

## 2021-03-28 NOTE — Discharge Summary (Signed)
Physician Discharge Summary  [name]:[id number] DOB: 15-Jul-1945 DOA: 03/18/2021  PCP: Rusty Aus, MD  Admit date: 03/18/2021 Discharge date: 03/28/2021 30 Day Unplanned Readmission Risk Score    Flowsheet Row ED to Hosp-Admission (Current) from 03/18/2021 in [facility]  30 Day Unplanned Readmission Risk Score (%) 27.75 Filed at 03/25/2021 0400       This score is the patient's risk of an unplanned readmission within 30 days of being discharged (0 -100%). The score is based on dignosis, age, lab data, medications, orders, and past utilization.   Low:  0-14.9   Medium: 15-21.9   High: 22-29.9   Extreme: 30 and above          Admitted From: Home Disposition: SNF  Recommendations for Outpatient Follow-up:  Follow up with PCP in 1-2 weeks Please obtain BMP/CBC in one week Follow-up with general surgery as per their scheduled time and date Please follow up with your PCP on the following pending results: Unresulted Labs (From admission, onward)    None         Home Health: None Equipment/Devices: None  Discharge Condition: Stable CODE STATUS: DNR Diet recommendation: Low-sodium  Subjective: Seen and examined.  Slightly sleepy but easily arousable and when talking, seems to be oriented.  Denies any complaint.  Brief/Interim Summary: [name] is a 75 y.o. male with medical history significant of AAA; COPD; CAD; chronic systolic CHF; PAD; HTN; HLD; and prostate CA presented with n/v.   He was previously admitted to the neurosurgery service from 11/7-12 with SDH and was discharged to [facility] and remained there through 11/30.  He progressed well while there and had no apparent issues.  However, he arrived home on 11/30 and started vomiting shortly thereafter with any attempt at food/drink intake.  Upon arrival to ED, he was found to have an incarcerated hernia for which he underwent surgical repair in the OR right away and then was admitted to  hospital service.  He was followed by general surgery, started on clear liquid diet and diet advanced and he has been passing flatus and has had multiple bowel movements and tolerating regular diet.  He is cleared from general surgery for discharge.  Assessed by PT OT and they recommended SNF.  Finally family picked up a facility, insurance authorization started yesterday, I was informed by [facility] that we are hoping to receive authorization today and if so, patient will be discharged today.  He will follow-up with general surgery within 2 weeks.   Chronic combined CHF: Echo March 2022 shows EF of 30 to 35% with grade 1 diastolic dysfunction.  He appears to be well compensated.  Continue Entresto and Aldactone.    Acute thrombocytopenia: Likely due to acute illness.  Resolved.   Essential hypertension: Controlled, continue Coreg and Entresto.   Hyperlipidemia: Continue Lipitor.   Hypokalemia: Resolved.   COPD: Not in exacerbation.  Continue Symbicort and albuterol.   PVD: -AAA, 3.8 cm -s/p aortobi-iliac stent graft, patent -Severe atherosclerotic disease on CT   Fall in the hospital: Per reports, patient had unwitnessed fall late night of 03/21/2021.  Reportedly, he did hit his head on the floor but he did not have any complaints.  He was assessed by night hospitalist.  CT head revealed no new acute abnormality. Resolving anterior right temporal intraparenchymal hematoma. Small right convexity chronic subdural hematoma with unchanged 3 mm leftward midline shift. Lumbar spine as well as rib x-ray was done and no  acute fracture was found.  Due to him complaining of hip pain, pelvic x-ray was also done which ruled out fracture.  Patient has been seen by PT OT as well and they did not notice any deficit.  Patient is not complaining of any deficit either and on my examination, he has no focal deficit either.   Delirium: Was treated with Seroquel.  Now fully alert and oriented.  Ps: Patient was discharged  early morning on 03/25/2021 while we were still waiting for insurance approval.  I was informed at noon by [facility] that insurance was denied and peer-to-peer was required.  I called medical director at noon and left a voicemail.  Received a call back at around 2 PM, after lengthy discussion, medical Director approved patient's SNF placement however authorization number was not provided to [facility] until about 3:30 PM.  TOC was then informed by the facility that it was too late for them to admit the patient and that they do not accept patients on the weekend either so patient had to stay in the hospital for 3 more days and now he is being discharged today.  Patient has remained stable for past 3 days.  In fact his mental status has improved significantly.  Discharge Diagnoses:  Principal Problem:   Incarcerated inguinal hernia Active Problems:   Hyperlipidemia   Essential hypertension   Tobacco abuse   AAA (abdominal aortic aneurysm) without rupture   Centrilobular emphysema (HCC)   Chronic combined systolic and diastolic CHF (congestive heart failure) (HCC)   Intraparenchymal hemorrhage of brain [facility])    Discharge Instructions   Allergies as of 03/28/2021       Reactions   Lyrica [pregabalin] Swelling      Prednisone Other (See Comments)   Feels like having a heart attack. Has had the injection form before and does well. Oral Steroids        Medication List     STOP taking these medications    diclofenac Sodium 1 % Gel Commonly known as: VOLTAREN   HYDROcodone-acetaminophen 10-325 MG tablet Commonly known as: NORCO       TAKE these medications    acetaminophen 500 MG tablet Commonly known as: TYLENOL Take 2 tablets (1,000 mg total) by mouth every 6 (six) hours as needed. What changed:  medication strength how much to take when to take this reasons to take this   albuterol 108 (90 Base) MCG/ACT inhaler Commonly known as: VENTOLIN HFA Inhale 2 puffs into the lungs every  6 (six) hours as needed for wheezing.   atorvastatin 80 MG tablet Commonly known as: LIPITOR Take 1 tablet (80 mg total) by mouth daily.   budesonide-formoterol 160-4.5 MCG/ACT inhaler Commonly known as: Symbicort Inhale 2 puffs into the lungs 2 (two) times daily.   carvedilol 3.125 MG tablet Commonly known as: COREG Take 1 tablet (3.125 mg total) by mouth 2 (two) times daily.   cyclobenzaprine 5 MG tablet Commonly known as: FLEXERIL Take 1 tablet (5 mg total) by mouth at bedtime for 10 days.   Entresto 97-103 MG Generic drug: sacubitril-valsartan Take 1 tablet by mouth 2 (two) times daily.   multivitamin with minerals tablet Take 1 tablet by mouth daily.   nicotine 21 mg/24hr patch Commonly known as: NICODERM CQ - dosed in mg/24 hours Place 1 patch (21 mg total) onto the skin daily.   omeprazole 40 MG capsule Commonly known as: PRILOSEC TAKE 1 CAPSULE BY MOUTH DAILY USUALLY 30MINUTES BEFORE BREAKFAST What changed: See the  new instructions.   oxyCODONE 5 MG immediate release tablet Commonly known as: Oxy IR/ROXICODONE Take 1-2 tablets (5-10 mg total) by mouth every 4 (four) hours as needed for moderate pain.   polyethylene glycol powder 17 GM/SCOOP powder Commonly known as: GLYCOLAX/MIRALAX Take 17 g by mouth daily as needed for mild constipation.   senna-docusate 8.6-50 MG tablet Commonly known as: Senokot-S Take 2 tablets by mouth at bedtime.   spironolactone 25 MG tablet Commonly known as: ALDACTONE Take 0.5 tablets (12.5 mg total) by mouth daily.   valproic acid 250 MG capsule Commonly known as: DEPAKENE Take 1 capsule (250 mg total) by mouth 2 (two) times daily.        Contact information for follow-up providers     Sur[facility], [place] Follow up on 03/29/2021.   Specialty: General Surgery Why: 10am, arrive by 9:30am.  This is a nurse visit for staple removal. Contact information: [address] [place]  [address] [phone]         Jesusita Oka, MD Follow up on 04/14/2021.   Specialty: Surgery Why: 9am, arrive by 8:45am for check in Contact information: [address] [place] [address] [phone]         Rusty Aus, MD Follow up in 1 week(s).   Specialty: Internal Medicine Contact information: [place] [address] [phone]              Contact information for after-discharge care     Destination     [facility] Preferred SNF .   Service: Skilled Nursing Contact information: [address] [address] [phone]                    Allergies  Allergen Reactions   Lyrica [Pregabalin] Swelling        Prednisone Other (See Comments)    Feels like having a heart attack. Has had the injection form before and does well. Oral Steroids    Consultations: General surgery   Procedures/Studies: DG Ribs Unilateral W/Chest Right  Result Date: 03/22/2021 CLINICAL DATA:  Fall, back pain EXAM: RIGHT RIBS AND CHEST - 3+ VIEW COMPARISON:  None FINDINGS: No fracture or other bone lesions are seen involving the ribs. There is no evidence of pneumothorax or pleural effusion. Both lungs are clear. Heart size and mediastinal contours are within normal limits. IMPRESSION: Negative. Electronically Signed   By: Fidela Salisbury M.D.   On: 03/22/2021 01:58   DG Lumbar Spine 2-3 Views  Result Date: 03/22/2021 CLINICAL DATA:  Fall, back pain EXAM: LUMBAR SPINE - 2-3 VIEW COMPARISON:  None. FINDINGS: A 10 cm wire like density overlies the soft tissues posterior to L1-3 and may represent an object overlying the patient or a retained radiopaque foreign body. L3-L5 lumbar fusion with instrumentation has been performed. No acute fracture or listhesis of the lumbar spine. Vertebral body height is preserved. There is intervertebral disc space narrowing  and endplate remodeling of M57-Q4 in keeping with changes of mild to moderate degenerative disc disease. Aortoiliac stent graft is in place. IMPRESSION: No acute fracture or listhesis of the lumbar spine. Wire like density overlying the left paraspinal soft tissues. Clinical correlation for a penetrating retained foreign body is recommended. Electronically Signed   By: Fidela Salisbury M.D.   On: 03/22/2021 01:57   CT HEAD WO CONTRAST (5MM)  Result Date: 03/22/2021 CLINICAL DATA:  Fall EXAM: CT HEAD WITHOUT CONTRAST TECHNIQUE: Contiguous axial images were obtained from the base of the skull through the vertex without intravenous contrast. COMPARISON:  03/18/2021 FINDINGS: Brain: No acute hemorrhage. Small right convexity chronic subdural hematoma. Unchanged leftward midline shift of approximately 3 mm. Resolving anterior right temporal intraparenchymal hematoma. Vascular: Bilateral carotid atherosclerosis. Skull: Normal. Negative for fracture or focal lesion. Sinuses/Orbits: No acute finding. Other: None. IMPRESSION: 1. No new acute abnormality. 2. Resolving anterior right temporal intraparenchymal hematoma. 3. Small right convexity chronic subdural hematoma with unchanged 3 mm leftward midline shift. Electronically Signed   By: Ulyses Jarred M.D.   On: 03/22/2021 01:13   CT Head Wo Contrast  Result Date: 03/18/2021 CLINICAL DATA:  Nausea and vomiting EXAM: CT HEAD WITHOUT CONTRAST TECHNIQUE: Contiguous axial images were obtained from the base of the skull through the vertex without intravenous contrast. COMPARISON:  Head CT dated February 23, 2021 FINDINGS: Brain: Right subdural hemorrhage of the right cerebral convexity and right temporal lobe intraparenchymal hemorrhage are decreased in size and density when compared with prior exam. Approximately 5 mm of right to left midline shift, unchanged compared to prior. Basal cisterns are patent. Vascular: No hyperdense vessel or unexpected calcification. Skull:  Normal. Negative for fracture or focal lesion. Sinuses/Orbits: No acute finding. Other: None. IMPRESSION: 1. Expected evolution of right subdural hemorrhage and right temporal lobe intraparenchymal hemorrhage. Similar mass effect with approximately 5 mm of leftward midline shift. 2. No new acute abnormality. Electronically Signed   By: Yetta Glassman M.D.   On: 03/18/2021 09:38   CT ABDOMEN PELVIS W CONTRAST  Result Date: 03/18/2021 CLINICAL DATA:  Abdominal pain, hernia suspected EXAM: CT ABDOMEN AND PELVIS WITH CONTRAST TECHNIQUE: Multidetector CT imaging of the abdomen and pelvis was performed using the standard protocol following bolus administration of intravenous contrast. CONTRAST:  171mL OMNIPAQUE IOHEXOL 300 MG/ML  SOLN COMPARISON:  CT abdomen and pelvis 10/30/2017 FINDINGS: Lower chest: No acute process identified. Hepatobiliary: Liver is normal in size and contour with no suspicious mass identified. Small hypodensity adjacent to the falciform ligament likely represents focal fatty infiltration. Gallbladder is mildly distended and contains 1.4 cm calculus in the neck. No gallbladder wall thickening or pericholecystic edema. No biliary ductal dilatation identified. Pancreas: Atrophic with no suspicious mass or ductal dilatation visualized. Spleen: Normal in size without focal abnormality. Adrenals/Urinary Tract: 1.2 cm left adrenal gland nodule, stable and likely adenoma. Also stable appearing right adrenal gland nodularity measuring 1.2 cm. Two small nonobstructing calculi identified in the left kidney measuring up to 3 mm in the upper pole. Small hypodense cyst in the lower right kidney. No enhancing renal mass or hydronephrosis identified bilaterally. Urinary bladder appears grossly normal. Stomach/Bowel: Numerous loops of abnormally distended small bowel with air-fluid levels measuring up to 3.7 cm in diameter. There is a transition point identified within a left inguinal hernia which contains fat  and a loop of small bowel. No pneumatosis or free air identified. Mild colonic diverticulosis. Appendix is normal. Vascular/Lymphatic: Severe atherosclerotic disease. Aorto bi-iliac stent graft which is patent, for the fusiform infrarenal abdominal aortic aneurysm seen on previous studies, which measures up to 3.8 cm in diameter. No bulky lymphadenopathy identified. Reproductive: Brachytherapy seeds in the prostate. Other: Umbilical hernia containing fat.  No ascites. Musculoskeletal: Degenerative and postsurgical changes of the lumbar spine. IMPRESSION: 1. High-grade small bowel obstruction with transition point in the left inguinal hernia. Surgical consultation recommended. 2. Cholelithiasis. 3. Left nephrolithiasis. Bilateral adrenal gland nodules, likely adenomas.  4. Multiple additional chronic findings as described. Findings discussed with Dr. Maryan Rued over the telephone at 1:09 p.m. on 03/18/2021 with read back. Electronically Signed   By: Ofilia Neas M.D.   On: 03/18/2021 13:19   DG Pelvis Portable  Result Date: 03/22/2021 CLINICAL DATA:  Trauma, fall EXAM: PORTABLE PELVIS 1-2 VIEWS COMPARISON:  None. FINDINGS: No displaced fracture is seen. Joint spaces in both hips appear symmetrical. Arterial calcifications are seen in the soft tissues. There are metallic densities in the prostate suggesting previous brachytherapy. Skin staples are seen in the lower abdomen and left inguinal region suggesting recent surgery. There is surgical fusion in the lower lumbar spine. Vascular stents are noted in the course of common iliac arteries. IMPRESSION: No displaced fracture or dislocation is seen. Electronically Signed   By: Elmer Picker M.D.   On: 03/22/2021 11:01   DG Chest Port 1 View  Result Date: 03/18/2021 CLINICAL DATA:  Shortness of breath, history of COPD and smoking. EXAM: PORTABLE CHEST 1 VIEW COMPARISON:  Chest radiograph 02/21/2021 FINDINGS: Interval removal of endotracheal tube and  enteric tube. Stable cardiomediastinal silhouette with tortuous thoracic aorta. Aortic calcifications. Lungs are clear. No acute osseous abnormality. IMPRESSION: No acute cardiopulmonary abnormality. Aortic Atherosclerosis (ICD10-I70.0). Electronically Signed   By: Ileana Roup M.D.   On: 03/18/2021 09:08     Discharge Exam: Vitals:   03/28/21 0846 03/28/21 0918  BP: (!) 157/70 (!) 157/65  Pulse: (!) 56 63  Resp: 18 16  Temp: 97.7 F (36.5 C) 97.6 F (36.4 C)  SpO2: 94% 97%   Vitals:   03/27/21 2123 03/28/21 0443 03/28/21 0846 03/28/21 0918  BP:  (!) 166/82 (!) 157/70 (!) 157/65  Pulse:  (!) 55 (!) 56 63  Resp:  17 18 16   Temp:  98 F (36.7 C) 97.7 F (36.5 C) 97.6 F (36.4 C)  TempSrc:   Oral Oral  SpO2: 98% 99% 94% 97%  Weight:      Height:        General: Pt is slightly sleepy, not in acute distress Cardiovascular: RRR, S1/S2 +, no rubs, no gallops Respiratory: CTA bilaterally, no wheezing, no rhonchi Abdominal: Soft, NT, ND, bowel sounds + Extremities: no edema, no cyanosis    The results of significant diagnostics from this hospitalization (including imaging, microbiology, ancillary and laboratory) are listed below for reference.     Microbiology: Recent Results (from the past 240 hour(s))  SARS CORONAVIRUS 2 (TAT 6-24 HRS) Nasopharyngeal Nasopharyngeal Swab     Status: None   Collection Time: 03/24/21  1:48 PM   Specimen: Nasopharyngeal Swab  Result Value Ref Range Status   SARS Coronavirus 2 NEGATIVE NEGATIVE Final    Comment: (NOTE) SARS-CoV-2 target nucleic acids are NOT DETECTED.  The SARS-CoV-2 RNA is generally detectable in upper and lower respiratory specimens during the acute phase of infection. Negative results do not preclude SARS-CoV-2 infection, do not rule out co-infections with other pathogens, and should not be used as the sole basis for treatment or other patient management decisions. Negative results must be combined with clinical  observations, patient history, and epidemiological information. The expected result is Negative.  Fact Sheet for Patients: SugarRoll.be  Fact Sheet for Healthcare Providers: https://www.woods-mathews.com/  This test is not yet approved or cleared by the [place] FDA and  has been authorized for detection and/or diagnosis of SARS-CoV-2 by FDA under an Emergency Use Authorization (EUA). This EUA will remain  in effect (meaning this test can be used)  for the duration of the COVID-19 declaration under Se ction 564(b)(1) of the Act, 21 U.S.C. section 360bbb-3(b)(1), unless the authorization is terminated or revoked sooner.  Performed at [facility], [place] [address], [place], [place] [address]   SARS CORONAVIRUS 2 (TAT 6-24 HRS) Nasopharyngeal Nasopharyngeal Swab     Status: None   Collection Time: 03/27/21  4:07 PM   Specimen: Nasopharyngeal Swab  Result Value Ref Range Status   SARS Coronavirus 2 NEGATIVE NEGATIVE Final    Comment: (NOTE) SARS-CoV-2 target nucleic acids are NOT DETECTED.  The SARS-CoV-2 RNA is generally detectable in upper and lower respiratory specimens during the acute phase of infection. Negative results do not preclude SARS-CoV-2 infection, do not rule out co-infections with other pathogens, and should not be used as the sole basis for treatment or other patient management decisions. Negative results must be combined with clinical observations, patient history, and epidemiological information. The expected result is Negative.  Fact Sheet for Patients: SugarRoll.be  Fact Sheet for Healthcare Providers: https://www.woods-mathews.com/  This test is not yet approved or cleared by the [place] FDA and  has been authorized for detection and/or diagnosis of SARS-CoV-2 by FDA under an Emergency Use Authorization (EUA). This EUA will remain  in effect (meaning this  test can be used) for the duration of the COVID-19 declaration under Se ction 564(b)(1) of the Act, 21 U.S.C. section 360bbb-3(b)(1), unless the authorization is terminated or revoked sooner.  Performed at [facility], [place] [address], [place], [place] [address]      Labs: BNP (last 3 results) Recent Labs    02/21/21 0503  BNP 7,867.5*   Basic Metabolic Panel: Recent Labs  Lab 03/22/21 0340 03/23/21 0842 03/25/21 0313 03/28/21 0854  NA 133* 139 140 141  K 3.3* 4.0 4.1 3.8  CL 98 103 106 107  CO2 27 30 25 29   GLUCOSE 117* 92 68* 105*  BUN 14 15 17 9   CREATININE 0.93 0.90 0.87 0.87  CALCIUM 8.1* 7.9* 7.5* 7.7*  MG  --  1.8 1.7  --    Liver Function Tests: No results for input(s): AST, ALT, ALKPHOS, BILITOT, PROT, ALBUMIN in the last 168 hours.  No results for input(s): LIPASE, AMYLASE in the last 168 hours.  No results for input(s): AMMONIA in the last 168 hours. CBC: Recent Labs  Lab 03/22/21 0340 03/23/21 0842 03/25/21 0313  WBC 9.2 7.0 6.1  NEUTROABS 7.8* 5.6 4.3  HGB 12.7* 12.7* 12.8*  HCT 37.8* 38.7* 38.4*  MCV 95.7 97.0 96.0  PLT 143* 182 185   Cardiac Enzymes: No results for input(s): CKTOTAL, CKMB, CKMBINDEX, TROPONINI in the last 168 hours. BNP: Invalid input(s): POCBNP CBG: Recent Labs  Lab 03/28/21 0542  GLUCAP 91   D-Dimer No results for input(s): DDIMER in the last 72 hours. Hgb A1c No results for input(s): HGBA1C in the last 72 hours. Lipid Profile No results for input(s): CHOL, HDL, LDLCALC, TRIG, CHOLHDL, LDLDIRECT in the last 72 hours. Thyroid function studies No results for input(s): TSH, T4TOTAL, T3FREE, THYROIDAB in the last 72 hours.  Invalid input(s): FREET3 Anemia work up No results for input(s): VITAMINB12, FOLATE, FERRITIN, TIBC, IRON, RETICCTPCT in the last 72 hours. Urinalysis    Component Value Date/Time   COLORURINE YELLOW (A) 02/21/2021 0641   APPEARANCEUR HAZY (A) 02/21/2021 0641   APPEARANCEUR Cloudy  04/21/2011 1418   LABSPEC 1.021 02/21/2021 0641   LABSPEC 1.013 04/21/2011 1418   PHURINE 5.0 02/21/2021 4492  GLUCOSEU NEGATIVE 02/21/2021 0641   GLUCOSEU Negative 04/21/2011 1418   HGBUR MODERATE (A) 02/21/2021 0641   BILIRUBINUR NEGATIVE 02/21/2021 0641   BILIRUBINUR Negative 04/21/2011 1418   KETONESUR 5 (A) 02/21/2021 0641   PROTEINUR 30 (A) 02/21/2021 0641   NITRITE POSITIVE (A) 02/21/2021 0641   LEUKOCYTESUR MODERATE (A) 02/21/2021 0641   LEUKOCYTESUR Negative 04/21/2011 1418   Sepsis Labs Invalid input(s): PROCALCITONIN,  WBC,  LACTICIDVEN Microbiology Recent Results (from the past 240 hour(s))  SARS CORONAVIRUS 2 (TAT 6-24 HRS) Nasopharyngeal Nasopharyngeal Swab     Status: None   Collection Time: 03/24/21  1:48 PM   Specimen: Nasopharyngeal Swab  Result Value Ref Range Status   SARS Coronavirus 2 NEGATIVE NEGATIVE Final    Comment: (NOTE) SARS-CoV-2 target nucleic acids are NOT DETECTED.  The SARS-CoV-2 RNA is generally detectable in upper and lower respiratory specimens during the acute phase of infection. Negative results do not preclude SARS-CoV-2 infection, do not rule out co-infections with other pathogens, and should not be used as the sole basis for treatment or other patient management decisions. Negative results must be combined with clinical observations, patient history, and epidemiological information. The expected result is Negative.  Fact Sheet for Patients: SugarRoll.be  Fact Sheet for Healthcare Providers: https://www.woods-mathews.com/  This test is not yet approved or cleared by the [place] FDA and  has been authorized for detection and/or diagnosis of SARS-CoV-2 by FDA under an Emergency Use Authorization (EUA). This EUA will remain  in effect (meaning this test can be used) for the duration of the COVID-19 declaration under Se ction 564(b)(1) of the Act, 21 U.S.C. section 360bbb-3(b)(1), unless  the authorization is terminated or revoked sooner.  Performed at [facility], [place] [address], [place], [place] [address]   SARS CORONAVIRUS 2 (TAT 6-24 HRS) Nasopharyngeal Nasopharyngeal Swab     Status: None   Collection Time: 03/27/21  4:07 PM   Specimen: Nasopharyngeal Swab  Result Value Ref Range Status   SARS Coronavirus 2 NEGATIVE NEGATIVE Final    Comment: (NOTE) SARS-CoV-2 target nucleic acids are NOT DETECTED.  The SARS-CoV-2 RNA is generally detectable in upper and lower respiratory specimens during the acute phase of infection. Negative results do not preclude SARS-CoV-2 infection, do not rule out co-infections with other pathogens, and should not be used as the sole basis for treatment or other patient management decisions. Negative results must be combined with clinical observations, patient history, and epidemiological information. The expected result is Negative.  Fact Sheet for Patients: SugarRoll.be  Fact Sheet for Healthcare Providers: https://www.woods-mathews.com/  This test is not yet approved or cleared by the [place] FDA and  has been authorized for detection and/or diagnosis of SARS-CoV-2 by FDA under an Emergency Use Authorization (EUA). This EUA will remain  in effect (meaning this test can be used) for the duration of the COVID-19 declaration under Se ction 564(b)(1) of the Act, 21 U.S.C. section 360bbb-3(b)(1), unless the authorization is terminated or revoked sooner.  Performed at [facility], [place] [address] [place], [place] [address]      Time coordinating discharge: Over 30 minutes  SIGNED:   Darliss Cheney, MD  [facility] 03/28/2021, 10:15 AM  If 7PM-7AM, please contact night-coverage [email or web address]

## 2021-03-28 NOTE — Progress Notes (Signed)
DISCHARGE NOTE SNF [name] to be discharged [place] per MD order. Patient verbalized understanding.  Skin clean, dry and intact without evidence of skin break down, no evidence of skin tears noted. IV catheter discontinued intact. Site without signs and symptoms of complications. Dressing and pressure applied. Pt denies pain at the site currently. No complaints noted.  Patient free of lines, drains, and wounds.   Discharge packet assembled. An After Visit Summary (AVS) was printed and given to the EMS personnel. Patient escorted via stretcher and discharged to [facility] via ambulance. Report called to accepting facility; all questions and concerns addressed.   Berneta Levins, RN

## 2021-03-29 ENCOUNTER — Non-Acute Institutional Stay (SKILLED_NURSING_FACILITY): Payer: PPO | Admitting: Adult Health

## 2021-03-29 ENCOUNTER — Encounter: Payer: Self-pay | Admitting: Adult Health

## 2021-03-29 DIAGNOSIS — Z720 Tobacco use: Secondary | ICD-10-CM

## 2021-03-29 DIAGNOSIS — I619 Nontraumatic intracerebral hemorrhage, unspecified: Secondary | ICD-10-CM | POA: Diagnosis not present

## 2021-03-29 DIAGNOSIS — I5042 Chronic combined systolic (congestive) and diastolic (congestive) heart failure: Secondary | ICD-10-CM

## 2021-03-29 DIAGNOSIS — J432 Centrilobular emphysema: Secondary | ICD-10-CM | POA: Diagnosis not present

## 2021-03-29 DIAGNOSIS — G441 Vascular headache, not elsewhere classified: Secondary | ICD-10-CM | POA: Diagnosis not present

## 2021-03-29 DIAGNOSIS — K4030 Unilateral inguinal hernia, with obstruction, without gangrene, not specified as recurrent: Secondary | ICD-10-CM

## 2021-03-29 DIAGNOSIS — I10 Essential (primary) hypertension: Secondary | ICD-10-CM

## 2021-03-29 NOTE — Progress Notes (Signed)
Location:  [facility] Room Number: [place] of Service:  SNF (31) Provider:  Durenda Age, DNP, FNP-BC  Patient Care Team: Rusty Aus, MD as PCP - General (Internal Medicine) Rockey Situ Kathlene November, MD as Consulting Physician (Cardiology)  Extended Emergency Contact Information Primary Emergency Contact: Mctavish,Vickie Address: [address]          [place], [place] [address] Johnnette Litter of [place] Phone: [phone] Mobile Phone: [phone] Relation: Spouse Secondary Emergency Contact: Wilburn Mylar Address: [address]          [place], [place] [address] Johnnette Litter of [place] Mobile Phone: [phone] Relation: Son  Code Status:  DNR  Goals of care: Advanced Directive information Advanced Directives 03/29/2021  Does Patient Have a Medical Advance Directive? No  Type of Advance Directive -  Does patient want to make changes to medical advance directive? -  Copy of [place] in Chart? -  Would patient like information on creating a medical advance directive? No - Patient declined     Chief Complaint  Patient presents with   Hospitalization Follow-up    Hospitalization Follow up    HPI:  Pt is a 75 y.o. male who was admitted to [facility] on  03/28/21 post hospital admission 03/18/21 to 03/28/21.  He has a PMH of AAA, COPD, CAD, chronic systolic CHF, PAD, hypertension, hyperlipidemia and prostate cancer.  He was recently hospitalized on 11/0 7-11/12 with SDH and was discharged to [facility] and remained there through 11/30.  He was discharged home 11/30 and started vomiting with any attempt at food/drink intake.  Upon arrival to ED, he was found to have an incarcerated hernia for which he had surgical repair on 03/18/2021.  He was started on clear liquid diet and advanced.  He has been passing flatus, has multiple bowel movements and tolerating regular diet.  He was seen in the room today.   He just came back from a doctor's appointment today. He is alert and oriented. He stated that he normally moves his bowels every 4 days.  He denies pain on his midline surgical incision which is covered by Steri-Strips.   Past Medical History:  Diagnosis Date   AAA (abdominal aortic aneurysm)    a.  CTA 7/19: measured 4.5 x 5.3 cm and greatest transverse dimensions   Abnormal nuclear stress test    a.  Myoview 2012: anterior wall ischemia with an estimated EF of 26%.  This was a new wall motion abnormality as well as a newly reduced EF   COPD (chronic obstructive pulmonary disease) (HCC)    Coronary artery disease    a.  Posterior MI in 2002 status post BMS to the LCx; b. [place] 2012: 95% stenosis of the proximal LAD, 95% stenosis of the mid LAD, 30% in-stent restenosis of the mid left circumflex with a second lesion of diffuse 50% stenosis.  The patient underwent successful PCI/BMS to the mid LAD with 0% residual stenosis, LV gram not performed   GERD (gastroesophageal reflux disease)    History of kidney stones    History of prostate cancer    Hyperlipidemia    Hypertension    Myocardial infarction [facility])    Neuromuscular disorder ([place])    Prostate cancer ([place])    a.  Status post seeding   PVD (peripheral vascular disease) ([place])    Stroke [facility])    Past Surgical History:  Procedure Laterality Date   BACK SURGERY  2009/2010   x 2   BOWEL RESECTION N/A 03/18/2021   Procedure: SMALL BOWEL RESECTION;  Surgeon: Jesusita Oka, MD;  Location: [facility];  Service: General;  Laterality: N/A;   CARDIAC CATHETERIZATION  2002   stent placement [place]   ENDOVASCULAR REPAIR/STENT GRAFT N/A 01/02/2018   Procedure: ENDOVASCULAR REPAIR/STENT GRAFT;  Surgeon: Algernon Huxley, MD;  Location: [facility] CV LAB;  Service: Cardiovascular;  Laterality: N/A;   FOOT SURGERY     bilateral    HERNIA REPAIR     bilateral    INGUINAL HERNIA REPAIR Left 03/18/2021   Procedure: INGUINAL HERNIA REPAIR;   Surgeon: Jesusita Oka, MD;  Location: [facility];  Service: General;  Laterality: Left;   INSERTION OF MESH Left 03/18/2021   Procedure: INSERTION OF MESH;  Surgeon: Jesusita Oka, MD;  Location: [facility];  Service: General;  Laterality: Left;   KNEE SURGERY     right knee    PROSTATE SURGERY  09/2009   prostate implant   [place] N/A 03/18/2021   Procedure: UMBILICAL HERNIA REPAIR;  Surgeon: Jesusita Oka, MD;  Location: [facility];  Service: General;  Laterality: N/A;    Allergies  Allergen Reactions   Lyrica [Pregabalin] Swelling        Prednisone Other (See Comments)    Feels like having a heart attack. Has had the injection form before and does well. Oral Steroids    Outpatient Encounter Medications as of 03/29/2021  Medication Sig   acetaminophen (TYLENOL) 500 MG tablet Take 2 tablets (1,000 mg total) by mouth every 6 (six) hours as needed.   albuterol (PROVENTIL HFA;VENTOLIN HFA) 108 (90 Base) MCG/ACT inhaler Inhale 2 puffs into the lungs every 6 (six) hours as needed for wheezing.   atorvastatin (LIPITOR) 80 MG tablet Take 1 tablet (80 mg total) by mouth daily.   bisacodyl (DULCOLAX) 10 MG suppository If not relieved by MOM, give 10 mg Bisacodyl suppositiory rectally X 1 dose in 24 hours as needed   budesonide-formoterol (SYMBICORT) 160-4.5 MCG/ACT inhaler Inhale 2 puffs into the lungs 2 (two) times daily.   carvedilol (COREG) 3.125 MG tablet Take 1 tablet (3.125 mg total) by mouth 2 (two) times daily.   cyclobenzaprine (FLEXERIL) 5 MG tablet Take 1 tablet (5 mg total) by mouth at bedtime for 10 days.   magnesium hydroxide (MILK OF MAGNESIA) 400 MG/5ML suspension If no BM in 3 days, give 30 cc Milk of Magnesium p.o. x 1 dose in 24 hours as needed   Multiple Vitamins-Minerals (MULTIVITAMIN WITH MINERALS) tablet Take 1 tablet by mouth daily.   nicotine (NICODERM CQ - DOSED IN MG/24 HOURS) 21 mg/24hr patch Place 1 patch (21 mg total) onto the skin  daily.   omeprazole (PRILOSEC) 40 MG capsule TAKE 1 CAPSULE BY MOUTH DAILY USUALLY 30MINUTES BEFORE BREAKFAST (Patient taking differently: Take 40 mg by mouth daily before breakfast. 30 minutes before breakfast)   oxyCODONE (OXY IR/ROXICODONE) 5 MG immediate release tablet Take 1-2 tablets (5-10 mg total) by mouth every 4 (four) hours as needed for moderate pain. (Patient taking differently: Take 5 mg by mouth every 6 (six) hours as needed for moderate pain.)   polyethylene glycol powder (GLYCOLAX/MIRALAX) powder Take 17 g by mouth daily as needed for mild constipation.   sacubitril-valsartan (ENTRESTO) 97-103 MG Take 1 tablet by mouth 2 (two) times daily.   senna-docusate (SENOKOT-S) 8.6-50 MG tablet Take 2 tablets by mouth  at bedtime.   Sodium Phosphates (RA SALINE ENEMA RE) If not relieved by Biscodyl suppository, give disposable Saline Enema rectally X 1 dose/24 hrs as needed. Contact MD as needed if no results from enema   spironolactone (ALDACTONE) 25 MG tablet Take 0.5 tablets (12.5 mg total) by mouth daily.   valproic acid (DEPAKENE) 250 MG capsule Take 1 capsule (250 mg total) by mouth 2 (two) times daily.   No facility-administered encounter medications on file as of 03/29/2021.    Review of Systems  GENERAL: No change in appetite, no fatigue, no weight changes, no fever and chills  MOUTH and THROAT: Denies oral discomfort, gingival pain or bleeding RESPIRATORY: no wheezing nor hemoptysis CARDIAC: No chest pain, edema or palpitations GI: No abdominal pain, diarrhea, constipation, heart burn, nausea or vomiting GU: Denies dysuria, frequency, hematuria, incontinence, or discharge NEUROLOGICAL: Denies dizziness, syncope, numbness, or headache PSYCHIATRIC: Denies feelings of depression or anxiety. No report of hallucinations, insomnia, paranoia, or agitation   Immunization History  Administered Date(s) Administered   Fluad Quad(high Dose 65+) 01/10/2019   Influenza Split 01/15/2012    Influenza Whole 01/16/2011   Influenza, High Dose Seasonal PF 01/28/2013   Influenza,inj,Quad PF,6+ Mos 02/24/2014, 01/11/2015, 12/28/2015, 01/08/2017, 01/12/2020   Influenza-Unspecified 02/24/2014, 01/11/2015, 12/28/2015, 01/08/2017, 12/16/2020   PFIZER Comirnaty([place] Top)Covid-19 Tri-Sucrose Vaccine 06/04/2019, 06/27/2019, 02/02/2020   PFIZER(Purple Top)SARS-COV-2 Vaccination 06/04/2019, 06/27/2019   Pneumococcal Conjugate-13 05/20/2014   Pneumococcal Polysaccharide-23 01/18/2011   Pneumococcal-Unspecified 01/31/2017   Zoster, Live 04/15/2014   Pertinent  Health Maintenance Due  Topic Date Due   INFLUENZA VACCINE  Completed   COLONOSCOPY (Pts 45-35yrs Insurance coverage will need to be confirmed)  Discontinued   Fall Risk 03/26/2021 03/26/2021 03/27/2021 03/27/2021 03/28/2021  Falls in the past year? - - - - -  Patient Fall Risk Level High fall risk High fall risk High fall risk High fall risk High fall risk     Vitals:   03/29/21 0951  BP: 136/74  Pulse: 82  Resp: 20  Temp: 98.3 F (36.8 C)  Height: 6' (1.829 m)   Body mass index is 23.38 kg/m.  Physical Exam  GENERAL APPEARANCE: Well nourished. In no acute distress. Normal body habitus SKIN:  vertical midline lower abdomen surgical incision with Steri-Strips, dry MOUTH and THROAT: Lips are without lesions. Oral mucosa is moist and without lesions.  RESPIRATORY: Breathing is even & unlabored, BS CTAB CARDIAC: RRR, no murmur,no extra heart sounds, no edema GI: Abdomen soft, normal BS, no tenderness EXTREMITIES:  Able to move X 4 extremities NEUROLOGICAL: There is no tremor. Speech is clear. Alert and oriented X 3. PSYCHIATRIC:  Affect and behavior are appropriate  Labs reviewed: Recent Labs    02/23/21 0452 02/28/21 0801 03/23/21 0842 03/25/21 0313 03/28/21 0854  NA 136   < > 139 140 141  K 3.7   < > 4.0 4.1 3.8  CL 101   < > 103 106 107  CO2 27   < > 30 25 29   GLUCOSE 102*   < > 92 68* 105*  BUN 11   <  > 15 17 9   CREATININE 0.87   < > 0.90 0.87 0.87  CALCIUM 8.5*   < > 7.9* 7.5* 7.7*  MG 1.8  --  1.8 1.7  --   PHOS 2.8  --   --   --   --    < > = values in this interval not displayed.   Recent Labs  02/21/21 0503 02/22/21 0546 03/18/21 0840  AST 18 18 18   ALT 18 13 15   ALKPHOS 84 60 78  BILITOT 1.9* 1.8* 1.3*  PROT 7.0 5.1* 6.4*  ALBUMIN 3.8 2.8* 3.5   Recent Labs    03/22/21 0340 03/23/21 0842 03/25/21 0313  WBC 9.2 7.0 6.1  NEUTROABS 7.8* 5.6 4.3  HGB 12.7* 12.7* 12.8*  HCT 37.8* 38.7* 38.4*  MCV 95.7 97.0 96.0  PLT 143* 182 185   Lab Results  Component Value Date   TSH 2.21 06/08/2015   Lab Results  Component Value Date   HGBA1C 5.5 02/21/2021   Lab Results  Component Value Date   CHOL 110 04/22/2018   HDL 37 (L) 04/22/2018   LDLCALC 62 04/22/2018   TRIG 81 02/22/2021   CHOLHDL 3.0 04/22/2018    Significant Diagnostic Results in last 30 days:  DG Ribs Unilateral W/Chest Right  Result Date: 03/22/2021 CLINICAL DATA:  Fall, back pain EXAM: RIGHT RIBS AND CHEST - 3+ VIEW COMPARISON:  None FINDINGS: No fracture or other bone lesions are seen involving the ribs. There is no evidence of pneumothorax or pleural effusion. Both lungs are clear. Heart size and mediastinal contours are within normal limits. IMPRESSION: Negative. Electronically Signed   By: Fidela Salisbury M.D.   On: 03/22/2021 01:58   DG Lumbar Spine 2-3 Views  Result Date: 03/22/2021 CLINICAL DATA:  Fall, back pain EXAM: LUMBAR SPINE - 2-3 VIEW COMPARISON:  None. FINDINGS: A 10 cm wire like density overlies the soft tissues posterior to L1-3 and may represent an object overlying the patient or a retained radiopaque foreign body. L3-L5 lumbar fusion with instrumentation has been performed. No acute fracture or listhesis of the lumbar spine. Vertebral body height is preserved. There is intervertebral disc space narrowing and endplate remodeling of O24-M3 in keeping with changes of mild to moderate  degenerative disc disease. Aortoiliac stent graft is in place. IMPRESSION: No acute fracture or listhesis of the lumbar spine. Wire like density overlying the left paraspinal soft tissues. Clinical correlation for a penetrating retained foreign body is recommended. Electronically Signed   By: Fidela Salisbury M.D.   On: 03/22/2021 01:57   CT HEAD WO CONTRAST (5MM)  Result Date: 03/22/2021 CLINICAL DATA:  Fall EXAM: CT HEAD WITHOUT CONTRAST TECHNIQUE: Contiguous axial images were obtained from the base of the skull through the vertex without intravenous contrast. COMPARISON:  03/18/2021 FINDINGS: Brain: No acute hemorrhage. Small right convexity chronic subdural hematoma. Unchanged leftward midline shift of approximately 3 mm. Resolving anterior right temporal intraparenchymal hematoma. Vascular: Bilateral carotid atherosclerosis. Skull: Normal. Negative for fracture or focal lesion. Sinuses/Orbits: No acute finding. Other: None. IMPRESSION: 1. No new acute abnormality. 2. Resolving anterior right temporal intraparenchymal hematoma. 3. Small right convexity chronic subdural hematoma with unchanged 3 mm leftward midline shift. Electronically Signed   By: Ulyses Jarred M.D.   On: 03/22/2021 01:13   CT Head Wo Contrast  Result Date: 03/18/2021 CLINICAL DATA:  Nausea and vomiting EXAM: CT HEAD WITHOUT CONTRAST TECHNIQUE: Contiguous axial images were obtained from the base of the skull through the vertex without intravenous contrast. COMPARISON:  Head CT dated February 23, 2021 FINDINGS: Brain: Right subdural hemorrhage of the right cerebral convexity and right temporal lobe intraparenchymal hemorrhage are decreased in size and density when compared with prior exam. Approximately 5 mm of right to left midline shift, unchanged compared to prior. Basal cisterns are patent. Vascular: No hyperdense vessel or unexpected calcification. Skull: Normal. Negative  for fracture or focal lesion. Sinuses/Orbits: No acute finding.  Other: None. IMPRESSION: 1. Expected evolution of right subdural hemorrhage and right temporal lobe intraparenchymal hemorrhage. Similar mass effect with approximately 5 mm of leftward midline shift. 2. No new acute abnormality. Electronically Signed   By: Yetta Glassman M.D.   On: 03/18/2021 09:38   CT ABDOMEN PELVIS W CONTRAST  Result Date: 03/18/2021 CLINICAL DATA:  Abdominal pain, hernia suspected EXAM: CT ABDOMEN AND PELVIS WITH CONTRAST TECHNIQUE: Multidetector CT imaging of the abdomen and pelvis was performed using the standard protocol following bolus administration of intravenous contrast. CONTRAST:  136mL OMNIPAQUE IOHEXOL 300 MG/ML  SOLN COMPARISON:  CT abdomen and pelvis 10/30/2017 FINDINGS: Lower chest: No acute process identified. Hepatobiliary: Liver is normal in size and contour with no suspicious mass identified. Small hypodensity adjacent to the falciform ligament likely represents focal fatty infiltration. Gallbladder is mildly distended and contains 1.4 cm calculus in the neck. No gallbladder wall thickening or pericholecystic edema. No biliary ductal dilatation identified. Pancreas: Atrophic with no suspicious mass or ductal dilatation visualized. Spleen: Normal in size without focal abnormality. Adrenals/Urinary Tract: 1.2 cm left adrenal gland nodule, stable and likely adenoma. Also stable appearing right adrenal gland nodularity measuring 1.2 cm. Two small nonobstructing calculi identified in the left kidney measuring up to 3 mm in the upper pole. Small hypodense cyst in the lower right kidney. No enhancing renal mass or hydronephrosis identified bilaterally. Urinary bladder appears grossly normal. Stomach/Bowel: Numerous loops of abnormally distended small bowel with air-fluid levels measuring up to 3.7 cm in diameter. There is a transition point identified within a left inguinal hernia which contains fat and a loop of small bowel. No pneumatosis or free air identified. Mild colonic  diverticulosis. Appendix is normal. Vascular/Lymphatic: Severe atherosclerotic disease. Aorto bi-iliac stent graft which is patent, for the fusiform infrarenal abdominal aortic aneurysm seen on previous studies, which measures up to 3.8 cm in diameter. No bulky lymphadenopathy identified. Reproductive: Brachytherapy seeds in the prostate. Other: Umbilical hernia containing fat.  No ascites. Musculoskeletal: Degenerative and postsurgical changes of the lumbar spine. IMPRESSION: 1. High-grade small bowel obstruction with transition point in the left inguinal hernia. Surgical consultation recommended. 2. Cholelithiasis. 3. Left nephrolithiasis. Bilateral adrenal gland nodules, likely adenomas. 4. Multiple additional chronic findings as described. Findings discussed with Dr. Maryan Rued over the telephone at 1:09 p.m. on 03/18/2021 with read back. Electronically Signed   By: Ofilia Neas M.D.   On: 03/18/2021 13:19   DG Pelvis Portable  Result Date: 03/22/2021 CLINICAL DATA:  Trauma, fall EXAM: PORTABLE PELVIS 1-2 VIEWS COMPARISON:  None. FINDINGS: No displaced fracture is seen. Joint spaces in both hips appear symmetrical. Arterial calcifications are seen in the soft tissues. There are metallic densities in the prostate suggesting previous brachytherapy. Skin staples are seen in the lower abdomen and left inguinal region suggesting recent surgery. There is surgical fusion in the lower lumbar spine. Vascular stents are noted in the course of common iliac arteries. IMPRESSION: No displaced fracture or dislocation is seen. Electronically Signed   By: Elmer Picker M.D.   On: 03/22/2021 11:01   DG Chest Port 1 View  Result Date: 03/18/2021 CLINICAL DATA:  Shortness of breath, history of COPD and smoking. EXAM: PORTABLE CHEST 1 VIEW COMPARISON:  Chest radiograph 02/21/2021 FINDINGS: Interval removal of endotracheal tube and enteric tube. Stable cardiomediastinal silhouette with tortuous thoracic aorta.  Aortic calcifications. Lungs are clear. No acute osseous abnormality. IMPRESSION: No acute cardiopulmonary abnormality. Aortic Atherosclerosis (  ICD10-I70.0). Electronically Signed   By: Ileana Roup M.D.   On: 03/18/2021 09:08    Assessment/Plan   1. Incarcerated inguinal hernia -  S/P surgical repair on 03/18/2021 -   follow up with general surgery -   Continue oxycodone IR 5 mg 1 tab every 6 hours PRN for pain  2. Chronic combined systolic and diastolic CHF (congestive heart failure) (HCC) -  stable, continue Aldactone 12.5 mg daily and Entresto 97 mg-103 mg 1 tab BID  3. Centrilobular emphysema (HCC) -  has 2L/min via [place] -   continue Symbicort 160-4.5  inhaler 2 puffs into the lungs BID  4. Primary hypertension -   continue Coreg 3.125 mg BID  5. Intraparenchymal hemorrhage of brain [facility]) -  sustained from a fall -  Neurosurgery, Dr, Christella Noa, was consulted and does not recommend surgical intervention  6. Tobacco use -  continue  Nicotine patch 21 mg/24 hour 1 patch to skin daily  7. Vascular headache -  continue Depakote 250 mg BID     Family/ staff Communication: Discussed plan of care with resident and charge nurse.  Labs/tests ordered:  CBC and BMP on 04/04/21  Goals of care:   Short-term care   Durenda Age, DNP, MSN, FNP-BC [facility] and [facility] [phone] (Monday-Friday 8:00 a.m. - 5:00 p.m.) [phone] (after hours)

## 2021-03-30 ENCOUNTER — Non-Acute Institutional Stay (SKILLED_NURSING_FACILITY): Payer: PPO | Admitting: Internal Medicine

## 2021-03-30 ENCOUNTER — Encounter: Payer: Self-pay | Admitting: Internal Medicine

## 2021-03-30 DIAGNOSIS — J439 Emphysema, unspecified: Secondary | ICD-10-CM | POA: Diagnosis not present

## 2021-03-30 DIAGNOSIS — K4030 Unilateral inguinal hernia, with obstruction, without gangrene, not specified as recurrent: Secondary | ICD-10-CM

## 2021-03-30 DIAGNOSIS — S065XAA Traumatic subdural hemorrhage with loss of consciousness status unknown, initial encounter: Secondary | ICD-10-CM

## 2021-03-30 NOTE — Assessment & Plan Note (Signed)
Some dyspepsia but no n/v or other GI symptoms.

## 2021-03-30 NOTE — Progress Notes (Signed)
NURSING HOME LOCATION:  [facility] ROOM NUMBER:  [id number]  CODE STATUS:  DNR  PCP:  Emily Filbert MD  This is a comprehensive admission note to this SNFperformed on this date less than 30 days from date of admission. Included are preadmission medical/surgical history; reconciled medication list; family history; social history and comprehensive review of systems.  Corrections and additions to the records were documented. Comprehensive physical exam was also performed. Additionally a clinical summary was entered for each active diagnosis pertinent to this admission in the Problem List to enhance continuity of care.  HPI: He was hospitalized 12/2 - 03/28/2021, presenting with nausea and vomiting. He had previously been admitted to the Neurosurgery service 11/7 - 02/26/2021 with a subdural hematoma sustained in a fall.  He had been discharged to [facility] and remained there until 11/30.  He was discharged home on 11/30 and began vomiting shortly thereafter with intake of any food or drink. In the ED an incarcerated hernia was documented.Left inguinal hernia repair and mesh insertion and umbilical hernia repair were completed 03/18/2021. Delirium was treated with Seroquel. He did have an unwitnessed fall 12/5 reportedly hitting his head on the floor without loss of consciousness.  CT of the head revealed no acute abnormality.  The anterior right temporal intraparenchymal hematoma was resolving.  He did have a small right convexity chronic subdural hematoma with stable 3 mm leftward midline shift.  PT/OT monitored the patient and found no deficits. Course was complicated by acute thrombocytopenia which did resolve.  Hypokalemia was corrected. Diet was advanced; he was passing flatus and having multiple bowel movements.  He was able to tolerate a regular diet. He was discharged to this SNF for PT/OT.  Past medical and surgical history: Includes history of chronic combined CHF, AAA, O2  dependent COPD, CAD with history of MI, GERD, history of nephrolithiasis, history of prostate cancer, dyslipidemia, essential hypertension, PVD, and history of stroke. Extensive surgical history includes back surgery, bowel resection, cardiac cath, endovascular repairs/stent graft, and prostate surgery.  Social history: Nondrinker; active smoker PTA.  Family history: Family history is positive for heart disease as well as lung cancer and diabetes.   Review of systems: When I asked why he had been hospitalized he began to tell me about the head trauma stating "I fell and hurt my head and had blood on the brain".  When I asked about the most recent admission he stated "my gut got into another got". He describes heartburn but no other active GI symptoms.  There is no nausea or vomiting or bowel changes.   He has a cough with minimal sputum.  He validates that he snores but there is no history of sleep apnea.  He stated that he was tired from working with PT/OT.  He has pain in the knees, greater on the right.  Constitutional: No fever, significant weight change Eyes: No redness, discharge, pain, vision change ENT/mouth: No nasal congestion, purulent discharge, earache, change in hearing, sore throat  Cardiovascular: No chest pain, palpitations, paroxysmal nocturnal dyspnea, claudication, edema  Respiratory: No hemoptysis Gastrointestinal: No  dysphagia, abdominal pain, rectal bleeding, melena Genitourinary: No dysuria, hematuria, pyuria, incontinence, nocturia Musculoskeletal: No joint stiffness, joint swelling, weakness, pain Dermatologic: No rash, pruritus, change in appearance of skin Neurologic: No dizziness,  syncope, seizures, numbness, tingling Psychiatric: No significant anxiety, depression, insomnia, anorexia Endocrine: No change in hair/skin/nails, excessive thirst, excessive hunger, excessive urination  Hematologic/lymphatic: No significant bruising, lymphadenopathy, abnormal  bleeding Allergy/immunology:  No itchy/watery eyes, significant sneezing, urticaria, angioedema  Physical exam:  Pertinent or positive findings: Hair is disheveled and unwashed.  Beard and mustache are unkempt.  He has bilateral ptosis.  Facies are weathered.  He is wearing nasal oxygen.  The left nasolabial fold is decreased.  He has complete dentures.  Chest is "silent".  The heart basically cannot be auscultated due to the hyperinflation.  Dressings are present at the umbilicus and left inguinal area.  Bowel sounds are active.  Posterior tibial pulses are stronger than dorsalis pedis pulses.  He has faint, bland splotchy hyperpigmentation over the shins.  The upper extremities are strong to opposition.  The lower extremities were not tested because of the hernia repair.  General appearance:  no acute distress, increased work of breathing is present.   Lymphatic: No lymphadenopathy about the head, neck, axilla. Eyes: No conjunctival inflammation or lid edema is present. There is no scleral icterus. Ears:  External ear exam shows no significant lesions or deformities.   Nose:  External nasal examination shows no deformity or inflammation. Nasal mucosa are pink and moist without lesions, exudates Neck:  No thyromegaly, masses, tenderness noted.    Lungs:  without wheezes, rhonchi, rales, rubs. Abdomen:  Abdomen is soft and nontender with no organomegaly, hernias, masses. GU: Deferred  Extremities:  No cyanosis, edema. Neurologic exam: Balance, Rhomberg, finger to nose testing could not be completed due to clinical state Skin: Warm & dry w/o tenting.  See clinical summary under each active problem in the Problem List with associated updated therapeutic plan

## 2021-03-30 NOTE — Assessment & Plan Note (Addendum)
Neurosurgery hospitalization 11/7 - 02/26/2021 with subdural hematoma sustained in a fall.  Discharged to [facility] until 11/30. Unwitnessed fall 03/21/2021, striking head without loss of consciousness.  CT of the head revealed no acute abnormality.  Anterior right temporal intraparenchymal hematoma resolving.  Small right convexity chronic subdural hematoma with stable 3 mm leftward midline shift present.  PT/OT noted no deficits.

## 2021-03-30 NOTE — Assessment & Plan Note (Signed)
Clinically compensated on nasal oxygen and pulmonary regimen.

## 2021-03-30 NOTE — Patient Instructions (Signed)
See assessment and plan under each diagnosis in the problem list and acutely for this visit 

## 2021-04-04 ENCOUNTER — Encounter: Payer: Self-pay | Admitting: Adult Health

## 2021-04-04 ENCOUNTER — Non-Acute Institutional Stay (SKILLED_NURSING_FACILITY): Payer: PPO | Admitting: Adult Health

## 2021-04-04 DIAGNOSIS — I5042 Chronic combined systolic (congestive) and diastolic (congestive) heart failure: Secondary | ICD-10-CM

## 2021-04-04 DIAGNOSIS — I10 Essential (primary) hypertension: Secondary | ICD-10-CM

## 2021-04-04 DIAGNOSIS — J432 Centrilobular emphysema: Secondary | ICD-10-CM

## 2021-04-04 DIAGNOSIS — K4030 Unilateral inguinal hernia, with obstruction, without gangrene, not specified as recurrent: Secondary | ICD-10-CM | POA: Diagnosis not present

## 2021-04-04 LAB — COMPREHENSIVE METABOLIC PANEL
Calcium: 8 — AB (ref 8.7–10.7)
GFR calc Af Amer: >90
GFR calc non Af Amer: >90

## 2021-04-04 LAB — BASIC METABOLIC PANEL
BUN: 10 (ref 4–21)
CO2: 29 — AB (ref 13–22)
Chloride: 108 (ref 99–108)
Creatinine: 0.6 (ref 0.6–1.3)
Glucose: 102
Potassium: 3.5 (ref 3.4–5.3)
Sodium: 148 — AB (ref 137–147)

## 2021-04-04 LAB — CBC AND DIFFERENTIAL
HCT: 36 — AB (ref 41–53)
Hemoglobin: 11.5 — AB (ref 13.5–17.5)
Platelets: 205 (ref 150–399)
WBC: 9.1

## 2021-04-04 LAB — CBC: RBC: 3.74 — AB (ref 3.87–5.11)

## 2021-04-04 NOTE — Progress Notes (Signed)
Location:  [place] Room Number: 092-Z Place of Service:  SNF (31) Provider:  Durenda Age, DNP, FNP-BC  Patient Care Team: Rusty Aus, MD as PCP - General (Internal Medicine) Rockey Situ Kathlene November, MD as Consulting Physician (Cardiology)  Extended Emergency Contact Information Primary Emergency Contact: Bewley,Vickie Address: [address]          [place], [place] [address] Johnnette Litter of [place] Phone: [phone] Mobile Phone: [phone] Relation: Spouse Secondary Emergency Contact: Wilburn Mylar Address: [address]          [place], [place] [address] Johnnette Litter of [place] Mobile Phone: [phone] Relation: Son  Code Status: DNR   Goals of care: Advanced Directive information Advanced Directives 04/04/2021  Does Patient Have a Medical Advance Directive? Yes  Type of Advance Directive Out of facility DNR (pink MOST or yellow form)  Does patient want to make changes to medical advance directive? No - Patient declined  Copy of [place] in Chart? -  Would patient like information on creating a medical advance directive? -     Chief Complaint  Patient presents with   Acute Visit    Short-term rehabilitation    HPI:  Pt is a 75 y.o. male seen today for an acute visit. He is currently having short-term rehabilitation post hospital admission 03/18/2021 to 03/28/2021. SBPs ranging from 133 to 149, with outlier 156 and 164. He takes Coreg 3.125 mg 1 tab twice a day for hypertension. No wheezing noted. He takes albuterol HFA 90 mcg inhaler 2 puffs into the lungs every 6 hours PRN and Symbicort 160-4.5 mcg inhaler 2 puffs into the lungs twice a day for COPD.  He has O2 at 2 L/minute via  continuously.  Patient stated that he does not use oxygen at home and wanted to wean off O2 as tolerated.  He takes Aldactone 12.5 mg daily and Entresto 97 mg - 103 mg 1 tab twice a day for congestive heart failure. He denies  having shortness of breath.  Past Medical History:  Diagnosis Date   AAA (abdominal aortic aneurysm)    a.  CTA 7/19: measured 4.5 x 5.3 cm and greatest transverse dimensions   Abnormal nuclear stress test    a.  Myoview 2012: anterior wall ischemia with an estimated EF of 26%.  This was a new wall motion abnormality as well as a newly reduced EF   COPD (chronic obstructive pulmonary disease) (HCC)    Coronary artery disease    a.  Posterior MI in 2002 status post BMS to the LCx; b. [place] 2012: 95% stenosis of the proximal LAD, 95% stenosis of the mid LAD, 30% in-stent restenosis of the mid left circumflex with a second lesion of diffuse 50% stenosis.  The patient underwent successful PCI/BMS to the mid LAD with 0% residual stenosis, LV gram not performed   GERD (gastroesophageal reflux disease)    History of kidney stones    History of prostate cancer    Hyperlipidemia    Hypertension    Myocardial infarction [facility])    Neuromuscular disorder ([place])    Prostate cancer ([place])    a.  Status post seeding   PVD (peripheral vascular disease) ([place])    Stroke [facility])    Past Surgical History:  Procedure Laterality Date   BACK SURGERY  2009/2010   x 2   BOWEL RESECTION N/A 03/18/2021   Procedure: SMALL BOWEL RESECTION;  Surgeon: Jesusita Oka, MD;  Location:  [place] OR;  Service: General;  Laterality: N/A;   CARDIAC CATHETERIZATION  2002   stent placement [place]   ENDOVASCULAR REPAIR/STENT GRAFT N/A 01/02/2018   Procedure: ENDOVASCULAR REPAIR/STENT GRAFT;  Surgeon: Algernon Huxley, MD;  Location: [place] CV LAB;  Service: Cardiovascular;  Laterality: N/A;   FOOT SURGERY     bilateral    HERNIA REPAIR     bilateral    INGUINAL HERNIA REPAIR Left 03/18/2021   Procedure: INGUINAL HERNIA REPAIR;  Surgeon: Jesusita Oka, MD;  Location: [place];  Service: General;  Laterality: Left;   INSERTION OF MESH Left 03/18/2021   Procedure: INSERTION OF MESH;  Surgeon: Jesusita Oka, MD;  Location: [place];  Service: General;  Laterality: Left;   KNEE SURGERY     right knee    PROSTATE SURGERY  09/2009   prostate implant   [place] N/A 03/18/2021   Procedure: UMBILICAL HERNIA REPAIR;  Surgeon: Jesusita Oka, MD;  Location: [place];  Service: General;  Laterality: N/A;    Allergies  Allergen Reactions   Lyrica [Pregabalin] Swelling        Prednisone Other (See Comments)    Feels like having a heart attack. Has had the injection form before and does well. Oral Steroids    Outpatient Encounter Medications as of 04/04/2021  Medication Sig   acetaminophen (TYLENOL) 500 MG tablet Take 2 tablets (1,000 mg total) by mouth every 6 (six) hours as needed.   albuterol (PROVENTIL HFA;VENTOLIN HFA) 108 (90 Base) MCG/ACT inhaler Inhale 2 puffs into the lungs every 6 (six) hours as needed for wheezing.   atorvastatin (LIPITOR) 80 MG tablet Take 1 tablet (80 mg total) by mouth daily.   bisacodyl (DULCOLAX) 10 MG suppository If not relieved by MOM, give 10 mg Bisacodyl suppositiory rectally X 1 dose in 24 hours as needed   budesonide-formoterol (SYMBICORT) 160-4.5 MCG/ACT inhaler Inhale 2 puffs into the lungs 2 (two) times daily.   carvedilol (COREG) 3.125 MG tablet Take 1 tablet (3.125 mg total) by mouth 2 (two) times daily.   [EXPIRED] cyclobenzaprine (FLEXERIL) 5 MG tablet Take 1 tablet (5 mg total) by mouth at bedtime for 10 days.   magnesium hydroxide (MILK OF MAGNESIA) 400 MG/5ML suspension If no BM in 3 days, give 30 cc Milk of Magnesium p.o. x 1 dose in 24 hours as needed   Multiple Vitamins-Minerals (MULTIVITAMIN WITH MINERALS) tablet Take 1 tablet by mouth daily.   omeprazole (PRILOSEC) 40 MG capsule TAKE 1 CAPSULE BY MOUTH DAILY USUALLY 30MINUTES BEFORE BREAKFAST (Patient taking differently: Take 40 mg by mouth daily before breakfast. 30 minutes before breakfast)   oxyCODONE (OXY IR/ROXICODONE) 5 MG immediate release tablet Take 1-2 tablets (5-10 mg total) by  mouth every 4 (four) hours as needed for moderate pain. (Patient taking differently: Take 5 mg by mouth every 6 (six) hours as needed for moderate pain.)   polyethylene glycol powder (GLYCOLAX/MIRALAX) powder Take 17 g by mouth daily as needed for mild constipation.   sacubitril-valsartan (ENTRESTO) 97-103 MG Take 1 tablet by mouth 2 (two) times daily.   senna-docusate (SENOKOT-S) 8.6-50 MG tablet Take 2 tablets by mouth at bedtime.   Sodium Phosphates (RA SALINE ENEMA RE) If not relieved by Biscodyl suppository, give disposable Saline Enema rectally X 1 dose/24 hrs as needed. Contact MD as needed if no results from enema   spironolactone (ALDACTONE) 25 MG tablet Take 0.5 tablets (12.5  mg total) by mouth daily.   valproic acid (DEPAKENE) 250 MG capsule Take 1 capsule (250 mg total) by mouth 2 (two) times daily.   nicotine (NICODERM CQ - DOSED IN MG/24 HOURS) 21 mg/24hr patch Place 1 patch (21 mg total) onto the skin daily.   No facility-administered encounter medications on file as of 04/04/2021.    Review of Systems  GENERAL: No change in appetite, no fatigue, no weight changes, no fever or chills  MOUTH and THROAT: Denies oral discomfort, gingival pain or bleeding RESPIRATORY: no cough, SOB, DOE, wheezing, hemoptysis CARDIAC: No chest pain, edema or palpitations GI: No abdominal pain, diarrhea, constipation, heart burn, nausea or vomiting GU: Denies dysuria, frequency, hematuria, incontinence, or discharge NEUROLOGICAL: Denies dizziness, syncope, numbness, or headache PSYCHIATRIC: Denies feelings of depression or anxiety. No report of hallucinations, insomnia, paranoia, or agitation    Immunization History  Administered Date(s) Administered   Fluad Quad(high Dose 65+) 01/10/2019   Influenza Split 01/15/2012   Influenza Whole 01/16/2011   Influenza, High Dose Seasonal PF 01/28/2013   Influenza,inj,Quad PF,6+ Mos 02/24/2014, 01/11/2015, 12/28/2015, 01/08/2017, 01/12/2020    Influenza-Unspecified 02/24/2014, 01/11/2015, 12/28/2015, 01/08/2017, 12/16/2020   PFIZER Comirnaty(Gray Top)Covid-19 Tri-Sucrose Vaccine 06/04/2019, 06/27/2019, 02/02/2020   PFIZER(Purple Top)SARS-COV-2 Vaccination 06/04/2019, 06/27/2019   Pneumococcal Conjugate-13 05/20/2014   Pneumococcal Polysaccharide-23 01/18/2011   Pneumococcal-Unspecified 01/31/2017   Zoster, Live 04/15/2014   Pertinent  Health Maintenance Due  Topic Date Due   INFLUENZA VACCINE  Completed   COLONOSCOPY (Pts 45-83yrs Insurance coverage will need to be confirmed)  Discontinued   Fall Risk 03/26/2021 03/26/2021 03/27/2021 03/27/2021 03/28/2021  Falls in the past year? - - - - -  Patient Fall Risk Level High fall risk High fall risk High fall risk High fall risk High fall risk     Vitals:   04/04/21 1121  BP: (!) 142/75  Pulse: 77  Resp: 20  Temp: (!) 97.2 F (36.2 C)  Weight: 166 lb 12.8 oz (75.7 kg)  Height: 6' (1.829 m)   Body mass index is 22.62 kg/m.  Physical Exam  GENERAL APPEARANCE: Well nourished. In no acute distress. Normal body habitus SKIN:  midline lower abdomen surgical site with steri-strips intact  MOUTH and THROAT: Lips are without lesions. Oral mucosa is moist and without lesions.  RESPIRATORY: Breathing is even & unlabored, BS CTAB CARDIAC: RRR, no murmur,no extra heart sounds, no edema GI: Abdomen soft, normal BS, no masses, no tenderness EXTREMITIES:  Able to move X 4 extremities. NEUROLOGICAL: There is no tremor. Speech is clear. Alert and oriented X 3. PSYCHIATRIC:  Affect and behavior are appropriate  Labs reviewed: Recent Labs    02/23/21 0452 02/28/21 0801 03/23/21 0842 03/25/21 0313 03/28/21 0854  NA 136   < > 139 140 141  K 3.7   < > 4.0 4.1 3.8  CL 101   < > 103 106 107  CO2 27   < > 30 25 29   GLUCOSE 102*   < > 92 68* 105*  BUN 11   < > 15 17 9   CREATININE 0.87   < > 0.90 0.87 0.87  CALCIUM 8.5*   < > 7.9* 7.5* 7.7*  MG 1.8  --  1.8 1.7  --   PHOS 2.8   --   --   --   --    < > = values in this interval not displayed.   Recent Labs    02/21/21 0503 02/22/21 0546 03/18/21 0840  AST 18  18 18  ALT 18 13 15   ALKPHOS 84 60 78  BILITOT 1.9* 1.8* 1.3*  PROT 7.0 5.1* 6.4*  ALBUMIN 3.8 2.8* 3.5   Recent Labs    03/22/21 0340 03/23/21 0842 03/25/21 0313  WBC 9.2 7.0 6.1  NEUTROABS 7.8* 5.6 4.3  HGB 12.7* 12.7* 12.8*  HCT 37.8* 38.7* 38.4*  MCV 95.7 97.0 96.0  PLT 143* 182 185   Lab Results  Component Value Date   TSH 2.21 06/08/2015   Lab Results  Component Value Date   HGBA1C 5.5 02/21/2021   Lab Results  Component Value Date   CHOL 110 04/22/2018   HDL 37 (L) 04/22/2018   LDLCALC 62 04/22/2018   TRIG 81 02/22/2021   CHOLHDL 3.0 04/22/2018    Significant Diagnostic Results in last 30 days:  DG Ribs Unilateral W/Chest Right  Result Date: 03/22/2021 CLINICAL DATA:  Fall, back pain EXAM: RIGHT RIBS AND CHEST - 3+ VIEW COMPARISON:  None FINDINGS: No fracture or other bone lesions are seen involving the ribs. There is no evidence of pneumothorax or pleural effusion. Both lungs are clear. Heart size and mediastinal contours are within normal limits. IMPRESSION: Negative. Electronically Signed   By: Fidela Salisbury M.D.   On: 03/22/2021 01:58   DG Lumbar Spine 2-3 Views  Result Date: 03/22/2021 CLINICAL DATA:  Fall, back pain EXAM: LUMBAR SPINE - 2-3 VIEW COMPARISON:  None. FINDINGS: A 10 cm wire like density overlies the soft tissues posterior to L1-3 and may represent an object overlying the patient or a retained radiopaque foreign body. L3-L5 lumbar fusion with instrumentation has been performed. No acute fracture or listhesis of the lumbar spine. Vertebral body height is preserved. There is intervertebral disc space narrowing and endplate remodeling of E95-M8 in keeping with changes of mild to moderate degenerative disc disease. Aortoiliac stent graft is in place. IMPRESSION: No acute fracture or listhesis of the lumbar spine.  Wire like density overlying the left paraspinal soft tissues. Clinical correlation for a penetrating retained foreign body is recommended. Electronically Signed   By: Fidela Salisbury M.D.   On: 03/22/2021 01:57   CT HEAD WO CONTRAST (5MM)  Result Date: 03/22/2021 CLINICAL DATA:  Fall EXAM: CT HEAD WITHOUT CONTRAST TECHNIQUE: Contiguous axial images were obtained from the base of the skull through the vertex without intravenous contrast. COMPARISON:  03/18/2021 FINDINGS: Brain: No acute hemorrhage. Small right convexity chronic subdural hematoma. Unchanged leftward midline shift of approximately 3 mm. Resolving anterior right temporal intraparenchymal hematoma. Vascular: Bilateral carotid atherosclerosis. Skull: Normal. Negative for fracture or focal lesion. Sinuses/Orbits: No acute finding. Other: None. IMPRESSION: 1. No new acute abnormality. 2. Resolving anterior right temporal intraparenchymal hematoma. 3. Small right convexity chronic subdural hematoma with unchanged 3 mm leftward midline shift. Electronically Signed   By: Ulyses Jarred M.D.   On: 03/22/2021 01:13   CT Head Wo Contrast  Result Date: 03/18/2021 CLINICAL DATA:  Nausea and vomiting EXAM: CT HEAD WITHOUT CONTRAST TECHNIQUE: Contiguous axial images were obtained from the base of the skull through the vertex without intravenous contrast. COMPARISON:  Head CT dated February 23, 2021 FINDINGS: Brain: Right subdural hemorrhage of the right cerebral convexity and right temporal lobe intraparenchymal hemorrhage are decreased in size and density when compared with prior exam. Approximately 5 mm of right to left midline shift, unchanged compared to prior. Basal cisterns are patent. Vascular: No hyperdense vessel or unexpected calcification. Skull: Normal. Negative for fracture or focal lesion. Sinuses/Orbits: No acute finding.  Other: None. IMPRESSION: 1. Expected evolution of right subdural hemorrhage and right temporal lobe intraparenchymal  hemorrhage. Similar mass effect with approximately 5 mm of leftward midline shift. 2. No new acute abnormality. Electronically Signed   By: Yetta Glassman M.D.   On: 03/18/2021 09:38   CT ABDOMEN PELVIS W CONTRAST  Result Date: 03/18/2021 CLINICAL DATA:  Abdominal pain, hernia suspected EXAM: CT ABDOMEN AND PELVIS WITH CONTRAST TECHNIQUE: Multidetector CT imaging of the abdomen and pelvis was performed using the standard protocol following bolus administration of intravenous contrast. CONTRAST:  152mL OMNIPAQUE IOHEXOL 300 MG/ML  SOLN COMPARISON:  CT abdomen and pelvis 10/30/2017 FINDINGS: Lower chest: No acute process identified. Hepatobiliary: Liver is normal in size and contour with no suspicious mass identified. Small hypodensity adjacent to the falciform ligament likely represents focal fatty infiltration. Gallbladder is mildly distended and contains 1.4 cm calculus in the neck. No gallbladder wall thickening or pericholecystic edema. No biliary ductal dilatation identified. Pancreas: Atrophic with no suspicious mass or ductal dilatation visualized. Spleen: Normal in size without focal abnormality. Adrenals/Urinary Tract: 1.2 cm left adrenal gland nodule, stable and likely adenoma. Also stable appearing right adrenal gland nodularity measuring 1.2 cm. Two small nonobstructing calculi identified in the left kidney measuring up to 3 mm in the upper pole. Small hypodense cyst in the lower right kidney. No enhancing renal mass or hydronephrosis identified bilaterally. Urinary bladder appears grossly normal. Stomach/Bowel: Numerous loops of abnormally distended small bowel with air-fluid levels measuring up to 3.7 cm in diameter. There is a transition point identified within a left inguinal hernia which contains fat and a loop of small bowel. No pneumatosis or free air identified. Mild colonic diverticulosis. Appendix is normal. Vascular/Lymphatic: Severe atherosclerotic disease. Aorto bi-iliac stent graft  which is patent, for the fusiform infrarenal abdominal aortic aneurysm seen on previous studies, which measures up to 3.8 cm in diameter. No bulky lymphadenopathy identified. Reproductive: Brachytherapy seeds in the prostate. Other: Umbilical hernia containing fat.  No ascites. Musculoskeletal: Degenerative and postsurgical changes of the lumbar spine. IMPRESSION: 1. High-grade small bowel obstruction with transition point in the left inguinal hernia. Surgical consultation recommended. 2. Cholelithiasis. 3. Left nephrolithiasis. Bilateral adrenal gland nodules, likely adenomas. 4. Multiple additional chronic findings as described. Findings discussed with Dr. Maryan Rued over the telephone at 1:09 p.m. on 03/18/2021 with read back. Electronically Signed   By: Ofilia Neas M.D.   On: 03/18/2021 13:19   DG Pelvis Portable  Result Date: 03/22/2021 CLINICAL DATA:  Trauma, fall EXAM: PORTABLE PELVIS 1-2 VIEWS COMPARISON:  None. FINDINGS: No displaced fracture is seen. Joint spaces in both hips appear symmetrical. Arterial calcifications are seen in the soft tissues. There are metallic densities in the prostate suggesting previous brachytherapy. Skin staples are seen in the lower abdomen and left inguinal region suggesting recent surgery. There is surgical fusion in the lower lumbar spine. Vascular stents are noted in the course of common iliac arteries. IMPRESSION: No displaced fracture or dislocation is seen. Electronically Signed   By: Elmer Picker M.D.   On: 03/22/2021 11:01   DG Chest Port 1 View  Result Date: 03/18/2021 CLINICAL DATA:  Shortness of breath, history of COPD and smoking. EXAM: PORTABLE CHEST 1 VIEW COMPARISON:  Chest radiograph 02/21/2021 FINDINGS: Interval removal of endotracheal tube and enteric tube. Stable cardiomediastinal silhouette with tortuous thoracic aorta. Aortic calcifications. Lungs are clear. No acute osseous abnormality. IMPRESSION: No acute cardiopulmonary abnormality.  Aortic Atherosclerosis (ICD10-I70.0). Electronically Signed   By: Ileana Roup  M.D.   On: 03/18/2021 09:08    Assessment/Plan  1. Essential hypertension -   BPs slightly elevated -   continue Coreg 3.125 mg BID -  monitor BPs  2. Centrilobular emphysema (HCC) -  no wheezing -   Wean off O2 as tolerated -   Continue Symbicort and PRN albuterol HFA  3. Chronic combined systolic and diastolic CHF (congestive heart failure) (HCC) -   no SOB, continue Aldactone and Entresto  4. Incarcerated inguinal hernia -  S/P surgical repair on 03/18/2021 -    Follow-up with general surgery -   Oxycodone IR 5 mg 1 tab every 6 hours PRN for pain    Family/ staff Communication:   Discussed plan of care with resident and charge nurse.  Labs/tests ordered:   None  Goals of care:   Short-term care   Durenda Age, DNP, MSN, FNP-BC [facility] [phone] (Monday-Friday 8:00 a.m. - 5:00 p.m.) [phone] (after hours)

## 2021-04-07 ENCOUNTER — Non-Acute Institutional Stay (SKILLED_NURSING_FACILITY): Payer: PPO | Admitting: Adult Health

## 2021-04-07 ENCOUNTER — Encounter: Payer: Self-pay | Admitting: Adult Health

## 2021-04-07 DIAGNOSIS — J9601 Acute respiratory failure with hypoxia: Secondary | ICD-10-CM

## 2021-04-07 DIAGNOSIS — J189 Pneumonia, unspecified organism: Secondary | ICD-10-CM | POA: Diagnosis not present

## 2021-04-07 DIAGNOSIS — G441 Vascular headache, not elsewhere classified: Secondary | ICD-10-CM

## 2021-04-07 DIAGNOSIS — I5042 Chronic combined systolic (congestive) and diastolic (congestive) heart failure: Secondary | ICD-10-CM

## 2021-04-07 DIAGNOSIS — I10 Essential (primary) hypertension: Secondary | ICD-10-CM | POA: Diagnosis not present

## 2021-04-07 DIAGNOSIS — J432 Centrilobular emphysema: Secondary | ICD-10-CM | POA: Diagnosis not present

## 2021-04-07 DIAGNOSIS — K4030 Unilateral inguinal hernia, with obstruction, without gangrene, not specified as recurrent: Secondary | ICD-10-CM | POA: Diagnosis not present

## 2021-04-07 MED ORDER — ALBUTEROL SULFATE HFA 108 (90 BASE) MCG/ACT IN AERS: 2.0000 | INHALATION_SPRAY | Freq: Four times a day (QID) | RESPIRATORY_TRACT | 1 refills | Status: AC | PRN

## 2021-04-07 MED ORDER — SACCHAROMYCES BOULARDII 250 MG PO CAPS: 250.0000 mg | ORAL_CAPSULE | Freq: Two times a day (BID) | ORAL | 0 refills | Status: AC

## 2021-04-07 MED ORDER — OMEPRAZOLE 40 MG PO CPDR: DELAYED_RELEASE_CAPSULE | ORAL | 0 refills | Status: AC

## 2021-04-07 MED ORDER — CARVEDILOL 3.125 MG PO TABS: 3.1250 mg | ORAL_TABLET | Freq: Two times a day (BID) | ORAL | 0 refills | Status: DC

## 2021-04-07 MED ORDER — SPIRONOLACTONE 25 MG PO TABS
25.0000 mg | ORAL_TABLET | Freq: Every day | ORAL | 0 refills | Status: DC
Start: 2021-04-07 — End: 2021-04-28

## 2021-04-07 MED ORDER — BUDESONIDE-FORMOTEROL FUMARATE 160-4.5 MCG/ACT IN AERO: 2.0000 | INHALATION_SPRAY | Freq: Two times a day (BID) | RESPIRATORY_TRACT | 0 refills | Status: DC

## 2021-04-07 MED ORDER — VALPROIC ACID 250 MG PO CAPS
250.0000 mg | ORAL_CAPSULE | Freq: Two times a day (BID) | ORAL | 0 refills | Status: DC
Start: 2021-04-07 — End: 2022-12-21

## 2021-04-07 MED ORDER — ENTRESTO 97-103 MG PO TABS: 1.0000 | ORAL_TABLET | Freq: Two times a day (BID) | ORAL | 0 refills | Status: DC

## 2021-04-07 MED ORDER — ATORVASTATIN CALCIUM 80 MG PO TABS: 80.0000 mg | ORAL_TABLET | Freq: Every day | ORAL | 0 refills | Status: DC

## 2021-04-07 MED ORDER — GUAIFENESIN 100 MG/5ML PO LIQD: 10.0000 mL | Freq: Three times a day (TID) | ORAL | 0 refills | Status: AC

## 2021-04-07 MED ORDER — OXYCODONE HCL 5 MG PO TABS: 5.0000 mg | ORAL_TABLET | Freq: Four times a day (QID) | ORAL | 0 refills | Status: DC | PRN

## 2021-04-07 MED ORDER — DOXYCYCLINE HYCLATE 100 MG PO TABS: 100.0000 mg | ORAL_TABLET | Freq: Two times a day (BID) | ORAL | 0 refills | Status: AC

## 2021-04-07 NOTE — Progress Notes (Signed)
Location:  [facility] Room Number: 419-F Place of Service:  SNF (31) Provider:  Durenda Age, DNP, FNP-BC  Patient Care Team: Rusty Aus, MD as PCP - General (Internal Medicine) Rockey Situ Kathlene November, MD as Consulting Physician (Cardiology)  Extended Emergency Contact Information Primary Emergency Contact: Silveria,Vickie Address: [address]          [place], [place] [address] Johnnette Litter of [place] Phone: [phone] Mobile Phone: [phone] Relation: Spouse Secondary Emergency Contact: Wilburn Mylar Address: [place]          [place], [place] [address] Johnnette Litter of [place] Mobile Phone: [phone] Relation: Son  Code Status: DNR   Goals of care: Advanced Directive information Advanced Directives 04/07/2021  Does Patient Have a Medical Advance Directive? Yes  Type of Advance Directive Out of facility DNR (pink MOST or yellow form)  Does patient want to make changes to medical advance directive? No - Patient declined  Copy of [place] in Chart? -  Would patient like information on creating a medical advance directive? -     Chief Complaint  Patient presents with   Discharge Note    For discharge home on 04/08/21 with Home health PT and OT    HPI:  Pt is a 75 y.o. male who is for discharge on 04/08/21 with Home health PT and OT.  He was admitted to [facility] on 03/28/2021 post hospital admission 03/18/2021 to 03/28/2021.  He has a PMH of AAA, COPD, CAD, chronic systolic CHF, PAD, hypertension, hyperlipidemia and prostate cancer.  He was recently hospitalized on 11/0 7-11/12 with SDH and was discharged to [facility] and remained there through 11/30.  He was discharged home on 11/30 and started vomiting with any attempts at food/drink intake.  Upon arrival to ED, he was found to have an incarcerated hernia for which he had surgical repair on 03/18/2021.   At [facility], he had  shortness of breath and coughing during the night.  Chest x-ray showed left basilar opacity.  COVID-19 rapid test was negative. He was started on Doxycycline 100 mg BID. BP today was 153/79. SBPs ranging from 146 to 169. He has BLE 1+edema. Discussed with patient that Spironolactone will be increased from 12.5 mg to 25 mg daily. He has O2 saturation of 87% on room air and 92-94% while on O2 @ 2L/min via .  Patient was admitted to this facility for short-term rehabilitation after the patient's recent hospitalization.  Patient has completed SNF rehabilitation and therapy has cleared the patient for discharge.   Past Medical History:  Diagnosis Date   AAA (abdominal aortic aneurysm)    a.  CTA 7/19: measured 4.5 x 5.3 cm and greatest transverse dimensions   Abnormal nuclear stress test    a.  Myoview 2012: anterior wall ischemia with an estimated EF of 26%.  This was a new wall motion abnormality as well as a newly reduced EF   COPD (chronic obstructive pulmonary disease) (HCC)    Coronary artery disease    a.  Posterior MI in 2002 status post BMS to the LCx; b. [place] 2012: 95% stenosis of the proximal LAD, 95% stenosis of the mid LAD, 30% in-stent restenosis of the mid left circumflex with a second lesion of diffuse 50% stenosis.  The patient underwent successful PCI/BMS to the mid LAD with 0% residual stenosis, LV gram not performed   GERD (gastroesophageal reflux disease)    History of kidney stones  History of prostate cancer    Hyperlipidemia    Hypertension    Myocardial infarction [facility])    Neuromuscular disorder ([place])    Prostate cancer ([place])    a.  Status post seeding   PVD (peripheral vascular disease) ([place])    Stroke [facility])    Past Surgical History:  Procedure Laterality Date   BACK SURGERY  2009/2010   x 2   BOWEL RESECTION N/A 03/18/2021   Procedure: SMALL BOWEL RESECTION;  Surgeon: Jesusita Oka, MD;  Location: [facility] OR;  Service: General;  Laterality: N/A;   CARDIAC  CATHETERIZATION  2002   stent placement [place]   ENDOVASCULAR REPAIR/STENT GRAFT N/A 01/02/2018   Procedure: ENDOVASCULAR REPAIR/STENT GRAFT;  Surgeon: Algernon Huxley, MD;  Location: [place] CV LAB;  Service: Cardiovascular;  Laterality: N/A;   FOOT SURGERY     bilateral    HERNIA REPAIR     bilateral    INGUINAL HERNIA REPAIR Left 03/18/2021   Procedure: INGUINAL HERNIA REPAIR;  Surgeon: Jesusita Oka, MD;  Location: [facility];  Service: General;  Laterality: Left;   INSERTION OF MESH Left 03/18/2021   Procedure: INSERTION OF MESH;  Surgeon: Jesusita Oka, MD;  Location: [facility];  Service: General;  Laterality: Left;   KNEE SURGERY     right knee    PROSTATE SURGERY  09/2009   prostate implant   [place] N/A 03/18/2021   Procedure: UMBILICAL HERNIA REPAIR;  Surgeon: Jesusita Oka, MD;  Location: [facility];  Service: General;  Laterality: N/A;    Allergies  Allergen Reactions   Lyrica [Pregabalin] Swelling        Prednisone Other (See Comments)    Feels like having a heart attack. Has had the injection form before and does well. Oral Steroids    Outpatient Encounter Medications as of 04/07/2021  Medication Sig   acetaminophen (TYLENOL) 500 MG tablet Take 2 tablets (1,000 mg total) by mouth every 6 (six) hours as needed.   albuterol (PROVENTIL HFA;VENTOLIN HFA) 108 (90 Base) MCG/ACT inhaler Inhale 2 puffs into the lungs every 6 (six) hours as needed for wheezing.   atorvastatin (LIPITOR) 80 MG tablet Take 1 tablet (80 mg total) by mouth daily.   bisacodyl (DULCOLAX) 10 MG suppository If not relieved by MOM, give 10 mg Bisacodyl suppositiory rectally X 1 dose in 24 hours as needed   budesonide-formoterol (SYMBICORT) 160-4.5 MCG/ACT inhaler Inhale 2 puffs into the lungs 2 (two) times daily.   carvedilol (COREG) 3.125 MG tablet Take 1 tablet (3.125 mg total) by mouth 2 (two) times daily.   DOXYCYCLINE HYCLATE PO Take 100 mg by mouth in the morning  and at bedtime. For 10 days   guaiFENesin (ROBITUSSIN) 100 MG/5ML liquid ADMINISTER 10MG  BY MOUTH 3 TIMES DAILY FOR TWO WEEKS   magnesium hydroxide (MILK OF MAGNESIA) 400 MG/5ML suspension If no BM in 3 days, give 30 cc Milk of Magnesium p.o. x 1 dose in 24 hours as needed   Multiple Vitamins-Minerals (MULTIVITAMIN WITH MINERALS) tablet Take 1 tablet by mouth daily.   NON FORMULARY Diet:NAS diet   omeprazole (PRILOSEC) 40 MG capsule TAKE 1 CAPSULE BY MOUTH DAILY USUALLY 30MINUTES BEFORE BREAKFAST   oxyCODONE (OXY IR/ROXICODONE) 5 MG immediate release tablet Take 1-2 tablets (5-10 mg total) by mouth every 4 (four) hours as needed for moderate pain. (Patient taking differently: Take 5 mg by mouth every 6 (six) hours as  needed for moderate pain.)   polyethylene glycol powder (GLYCOLAX/MIRALAX) powder Take 17 g by mouth daily as needed for mild constipation.   saccharomyces boulardii (FLORASTOR) 250 MG capsule Take 250 mg by mouth 2 (two) times daily. FOR 13 DAYS   sacubitril-valsartan (ENTRESTO) 97-103 MG Take 1 tablet by mouth 2 (two) times daily.   senna-docusate (SENOKOT-S) 8.6-50 MG tablet Take 2 tablets by mouth at bedtime.   Sodium Phosphates (RA SALINE ENEMA RE) If not relieved by Biscodyl suppository, give disposable Saline Enema rectally X 1 dose/24 hrs as needed. Contact MD as needed if no results from enema   spironolactone (ALDACTONE) 25 MG tablet Take 25 mg by mouth daily. FOR CHRONIC COMBINED CHF   valproic acid (DEPAKENE) 250 MG capsule Take 1 capsule (250 mg total) by mouth 2 (two) times daily.   nicotine (NICODERM CQ - DOSED IN MG/24 HOURS) 21 mg/24hr patch Place 1 patch (21 mg total) onto the skin daily.   [DISCONTINUED] spironolactone (ALDACTONE) 25 MG tablet Take 0.5 tablets (12.5 mg total) by mouth daily.   No facility-administered encounter medications on file as of 04/07/2021.    Review of Systems  GENERAL: No change in appetite, no fatigue, no weight changes, no fever or  chills  MOUTH and THROAT: Denies oral discomfort, gingival pain or bleeding RESPIRATORY: no cough, SOB, DOE, wheezing, hemoptysis CARDIAC: No chest pain, edema or palpitations GI: No abdominal pain, diarrhea, constipation, heart burn, nausea or vomiting GU: Denies dysuria, frequency, hematuria, incontinence, or discharge NEUROLOGICAL: Denies dizziness, syncope, numbness, or headache PSYCHIATRIC: Denies feelings of depression or anxiety. No report of hallucinations, insomnia, paranoia, or agitation   Immunization History  Administered Date(s) Administered   Fluad Quad(high Dose 65+) 01/10/2019   Influenza Split 01/15/2012   Influenza Whole 01/16/2011   Influenza, High Dose Seasonal PF 01/28/2013   Influenza,inj,Quad PF,6+ Mos 02/24/2014, 01/11/2015, 12/28/2015, 01/08/2017, 01/12/2020   Influenza-Unspecified 02/24/2014, 01/11/2015, 12/28/2015, 01/08/2017, 12/16/2020   PFIZER Comirnaty(Gray Top)Covid-19 Tri-Sucrose Vaccine 06/04/2019, 06/27/2019, 02/02/2020   PFIZER(Purple Top)SARS-COV-2 Vaccination 06/04/2019, 06/27/2019   Pneumococcal Conjugate-13 05/20/2014   Pneumococcal Polysaccharide-23 01/18/2011   Pneumococcal-Unspecified 01/31/2017   Zoster, Live 04/15/2014   Pertinent  Health Maintenance Due  Topic Date Due   INFLUENZA VACCINE  Completed   COLONOSCOPY (Pts 45-13yrs Insurance coverage will need to be confirmed)  Discontinued   Fall Risk 03/26/2021 03/26/2021 03/27/2021 03/27/2021 03/28/2021  Falls in the past year? - - - - -  Patient Fall Risk Level High fall risk High fall risk High fall risk High fall risk High fall risk     Vitals:   04/07/21 1133  BP: (!) 153/79  Pulse: 72  Resp: 20  Temp: (!) 97.4 F (36.3 C)  Weight: 166 lb 12.8 oz (75.7 kg)  Height: 6' (1.829 m)   Body mass index is 22.62 kg/m.  Physical Exam  GENERAL APPEARANCE: Well nourished. In no acute distress. Normal body habitus SKIN:  midline lower abdomen surgical incision with  steri-strips MOUTH and THROAT: Lips are without lesions. Oral mucosa is moist and without lesions.  RESPIRATORY: Breathing is even & unlabored, BS CTAB CARDIAC: RRR, no murmur,no extra heart sounds, BLE 1+edema GI: Abdomen soft, normal BS, no masses, no tenderness, NEUROLOGICAL: There is no tremor. Speech is clear. Alert and oriented X 3. PSYCHIATRIC:  Affect and behavior are appropriate  Labs reviewed: Recent Labs    02/23/21 0452 02/28/21 0801 03/23/21 0842 03/25/21 0313 03/28/21 0854 04/04/21 0000  NA 136   < > 139  140 141 148*  K 3.7   < > 4.0 4.1 3.8 3.5  CL 101   < > 103 106 107 108  CO2 27   < > 30 25 29  29*  GLUCOSE 102*   < > 92 68* 105*  --   BUN 11   < > 15 17 9 10   CREATININE 0.87   < > 0.90 0.87 0.87 0.6  CALCIUM 8.5*   < > 7.9* 7.5* 7.7* 8.0*  MG 1.8  --  1.8 1.7  --   --   PHOS 2.8  --   --   --   --   --    < > = values in this interval not displayed.   Recent Labs    02/21/21 0503 02/22/21 0546 03/18/21 0840  AST 18 18 18   ALT 18 13 15   ALKPHOS 84 60 78  BILITOT 1.9* 1.8* 1.3*  PROT 7.0 5.1* 6.4*  ALBUMIN 3.8 2.8* 3.5   Recent Labs    03/22/21 0340 03/23/21 0842 03/25/21 0313 04/04/21 0000  WBC 9.2 7.0 6.1 9.1  NEUTROABS 7.8* 5.6 4.3  --   HGB 12.7* 12.7* 12.8* 11.5*  HCT 37.8* 38.7* 38.4* 36*  MCV 95.7 97.0 96.0  --   PLT 143* 182 185 205   Lab Results  Component Value Date   TSH 2.21 06/08/2015   Lab Results  Component Value Date   HGBA1C 5.5 02/21/2021   Lab Results  Component Value Date   CHOL 110 04/22/2018   HDL 37 (L) 04/22/2018   LDLCALC 62 04/22/2018   TRIG 81 02/22/2021   CHOLHDL 3.0 04/22/2018    Significant Diagnostic Results in last 30 days:  DG Ribs Unilateral W/Chest Right  Result Date: 03/22/2021 CLINICAL DATA:  Fall, back pain EXAM: RIGHT RIBS AND CHEST - 3+ VIEW COMPARISON:  None FINDINGS: No fracture or other bone lesions are seen involving the ribs. There is no evidence of pneumothorax or pleural effusion.  Both lungs are clear. Heart size and mediastinal contours are within normal limits. IMPRESSION: Negative. Electronically Signed   By: Fidela Salisbury M.D.   On: 03/22/2021 01:58   DG Lumbar Spine 2-3 Views  Result Date: 03/22/2021 CLINICAL DATA:  Fall, back pain EXAM: LUMBAR SPINE - 2-3 VIEW COMPARISON:  None. FINDINGS: A 10 cm wire like density overlies the soft tissues posterior to L1-3 and may represent an object overlying the patient or a retained radiopaque foreign body. L3-L5 lumbar fusion with instrumentation has been performed. No acute fracture or listhesis of the lumbar spine. Vertebral body height is preserved. There is intervertebral disc space narrowing and endplate remodeling of A19-F7 in keeping with changes of mild to moderate degenerative disc disease. Aortoiliac stent graft is in place. IMPRESSION: No acute fracture or listhesis of the lumbar spine. Wire like density overlying the left paraspinal soft tissues. Clinical correlation for a penetrating retained foreign body is recommended. Electronically Signed   By: Fidela Salisbury M.D.   On: 03/22/2021 01:57   CT HEAD WO CONTRAST (5MM)  Result Date: 03/22/2021 CLINICAL DATA:  Fall EXAM: CT HEAD WITHOUT CONTRAST TECHNIQUE: Contiguous axial images were obtained from the base of the skull through the vertex without intravenous contrast. COMPARISON:  03/18/2021 FINDINGS: Brain: No acute hemorrhage. Small right convexity chronic subdural hematoma. Unchanged leftward midline shift of approximately 3 mm. Resolving anterior right temporal intraparenchymal hematoma. Vascular: Bilateral carotid atherosclerosis. Skull: Normal. Negative for fracture or focal lesion. Sinuses/Orbits: No acute finding.  Other: None. IMPRESSION: 1. No new acute abnormality. 2. Resolving anterior right temporal intraparenchymal hematoma. 3. Small right convexity chronic subdural hematoma with unchanged 3 mm leftward midline shift. Electronically Signed   By: Ulyses Jarred M.D.    On: 03/22/2021 01:13   CT Head Wo Contrast  Result Date: 03/18/2021 CLINICAL DATA:  Nausea and vomiting EXAM: CT HEAD WITHOUT CONTRAST TECHNIQUE: Contiguous axial images were obtained from the base of the skull through the vertex without intravenous contrast. COMPARISON:  Head CT dated February 23, 2021 FINDINGS: Brain: Right subdural hemorrhage of the right cerebral convexity and right temporal lobe intraparenchymal hemorrhage are decreased in size and density when compared with prior exam. Approximately 5 mm of right to left midline shift, unchanged compared to prior. Basal cisterns are patent. Vascular: No hyperdense vessel or unexpected calcification. Skull: Normal. Negative for fracture or focal lesion. Sinuses/Orbits: No acute finding. Other: None. IMPRESSION: 1. Expected evolution of right subdural hemorrhage and right temporal lobe intraparenchymal hemorrhage. Similar mass effect with approximately 5 mm of leftward midline shift. 2. No new acute abnormality. Electronically Signed   By: Yetta Glassman M.D.   On: 03/18/2021 09:38   CT ABDOMEN PELVIS W CONTRAST  Result Date: 03/18/2021 CLINICAL DATA:  Abdominal pain, hernia suspected EXAM: CT ABDOMEN AND PELVIS WITH CONTRAST TECHNIQUE: Multidetector CT imaging of the abdomen and pelvis was performed using the standard protocol following bolus administration of intravenous contrast. CONTRAST:  148mL OMNIPAQUE IOHEXOL 300 MG/ML  SOLN COMPARISON:  CT abdomen and pelvis 10/30/2017 FINDINGS: Lower chest: No acute process identified. Hepatobiliary: Liver is normal in size and contour with no suspicious mass identified. Small hypodensity adjacent to the falciform ligament likely represents focal fatty infiltration. Gallbladder is mildly distended and contains 1.4 cm calculus in the neck. No gallbladder wall thickening or pericholecystic edema. No biliary ductal dilatation identified. Pancreas: Atrophic with no suspicious mass or ductal dilatation visualized.  Spleen: Normal in size without focal abnormality. Adrenals/Urinary Tract: 1.2 cm left adrenal gland nodule, stable and likely adenoma. Also stable appearing right adrenal gland nodularity measuring 1.2 cm. Two small nonobstructing calculi identified in the left kidney measuring up to 3 mm in the upper pole. Small hypodense cyst in the lower right kidney. No enhancing renal mass or hydronephrosis identified bilaterally. Urinary bladder appears grossly normal. Stomach/Bowel: Numerous loops of abnormally distended small bowel with air-fluid levels measuring up to 3.7 cm in diameter. There is a transition point identified within a left inguinal hernia which contains fat and a loop of small bowel. No pneumatosis or free air identified. Mild colonic diverticulosis. Appendix is normal. Vascular/Lymphatic: Severe atherosclerotic disease. Aorto bi-iliac stent graft which is patent, for the fusiform infrarenal abdominal aortic aneurysm seen on previous studies, which measures up to 3.8 cm in diameter. No bulky lymphadenopathy identified. Reproductive: Brachytherapy seeds in the prostate. Other: Umbilical hernia containing fat.  No ascites. Musculoskeletal: Degenerative and postsurgical changes of the lumbar spine. IMPRESSION: 1. High-grade small bowel obstruction with transition point in the left inguinal hernia. Surgical consultation recommended. 2. Cholelithiasis. 3. Left nephrolithiasis. Bilateral adrenal gland nodules, likely adenomas. 4. Multiple additional chronic findings as described. Findings discussed with Dr. Maryan Rued over the telephone at 1:09 p.m. on 03/18/2021 with read back. Electronically Signed   By: Ofilia Neas M.D.   On: 03/18/2021 13:19   DG Pelvis Portable  Result Date: 03/22/2021 CLINICAL DATA:  Trauma, fall EXAM: PORTABLE PELVIS 1-2 VIEWS COMPARISON:  None. FINDINGS: No displaced fracture is seen. Joint spaces in  both hips appear symmetrical. Arterial calcifications are seen in the soft  tissues. There are metallic densities in the prostate suggesting previous brachytherapy. Skin staples are seen in the lower abdomen and left inguinal region suggesting recent surgery. There is surgical fusion in the lower lumbar spine. Vascular stents are noted in the course of common iliac arteries. IMPRESSION: No displaced fracture or dislocation is seen. Electronically Signed   By: Elmer Picker M.D.   On: 03/22/2021 11:01   DG Chest Port 1 View  Result Date: 03/18/2021 CLINICAL DATA:  Shortness of breath, history of COPD and smoking. EXAM: PORTABLE CHEST 1 VIEW COMPARISON:  Chest radiograph 02/21/2021 FINDINGS: Interval removal of endotracheal tube and enteric tube. Stable cardiomediastinal silhouette with tortuous thoracic aorta. Aortic calcifications. Lungs are clear. No acute osseous abnormality. IMPRESSION: No acute cardiopulmonary abnormality. Aortic Atherosclerosis (ICD10-I70.0). Electronically Signed   By: Ileana Roup M.D.   On: 03/18/2021 09:08    Assessment/Plan  1. Incarcerated inguinal hernia -  S/P surgical repair on 03/18/2021 -    Follow-up with general surgery - oxyCODONE (OXY IR/ROXICODONE) 5 MG immediate release tablet; Take 1 tablet (5 mg total) by mouth every 6 (six) hours as needed for moderate pain.  Dispense: 15 tablet; Refill: 0  2. HCAP (healthcare-associated pneumonia) - doxycycline (VIBRA-TABS) 100 MG tablet; Take 1 tablet (100 mg total) by mouth in the morning and at bedtime for 8 days. For 10 days  Dispense: 16 tablet; Refill: 0 - saccharomyces boulardii (FLORASTOR) 250 MG capsule; Take 1 capsule (250 mg total) by mouth 2 (two) times daily for 11 days. FOR 11 DAYS  Dispense: 22 capsule; Refill: 0 - guaiFENesin (ROBITUSSIN) 100 MG/5ML liquid; Take 10 mLs by mouth 3 (three) times daily for 7 days. ADMINISTER 10MG  BY MOUTH 3 TIMES DAILY FOR TWO WEEKS  Dispense: 210 mL; Refill: 0  3. Primary hypertension - carvedilol (COREG) 3.125 MG tablet; Take 1 tablet (3.125  mg total) by mouth 2 (two) times daily.  Dispense: 60 tablet; Refill: 0  4. Chronic combined systolic and diastolic CHF (congestive heart failure) (HCC) - spironolactone (ALDACTONE) 25 MG tablet; Take 1 tablet (25 mg total) by mouth daily. FOR CHRONIC COMBINED CHF  Dispense: 30 tablet; Refill: 0 - atorvastatin (LIPITOR) 80 MG tablet; Take 1 tablet (80 mg total) by mouth daily.  Dispense: 30 tablet; Refill: 0 - sacubitril-valsartan (ENTRESTO) 97-103 MG; Take 1 tablet by mouth 2 (two) times daily.  Dispense: 60 tablet; Refill: 0  5. Acute respiratory failure with hypoxemia (HCC) -  will continue O2 @ 2L/min via [place] -Continue albuterol  HFA 90 mcg inhaler inhale 2 puffs into the lungs every 6 hours PRN  6. Centrilobular emphysema (HCC) - budesonide-formoterol (SYMBICORT) 160-4.5 MCG/ACT inhaler; Inhale 2 puffs into the lungs 2 (two) times daily.  Dispense: 6 g; Refill: 0  7. Vascular headache - valproic acid (DEPAKENE) 250 MG capsule; Take 1 capsule (250 mg total) by mouth 2 (two) times daily.  Dispense: 60 capsule; Refill: 0       I have filled out patient's discharge paperwork and e-prescribed medications.  Patient will have home health PT and OT.  DME provided:  O2 @ 2L/min via [place] O2 sat on room air 87% O2 sat with O2 at 2L/min via [place]  is 92-94%   Total discharge time: Greater than 30 minutes   Greater than 50% was spent in counseling and coordination of care.  Discharge time involved coordination of the discharge process with Education officer, museum, nursing  staff and therapy department. Medical justification for home health services/DME verified.    Durenda Age, DNP, MSN, FNP-BC [facility] [phone] (Monday-Friday 8:00 a.m. - 5:00 p.m.) [phone] (after hours)

## 2021-04-13 ENCOUNTER — Encounter: Payer: PPO | Admitting: Registered Nurse

## 2021-04-14 ENCOUNTER — Telehealth: Payer: Self-pay | Admitting: Cardiovascular Disease

## 2021-04-14 NOTE — Telephone Encounter (Signed)
Reach out to pt's wife [name], advised receive application, although did not receive proof of income. [name] reports will have caregiver bring them to office to have copies made tomorrow or Monday.   Received PA application Completed provider's portion, attached copy of insurance card and med list Dr. Rockey Situ needs to sign, will have him sign Monday when he returns to office.   Entresto 97-103 mg

## 2021-04-14 NOTE — Telephone Encounter (Signed)
Patient caretaker brought in patient assistance forms Placed in nurse box

## 2021-04-15 DIAGNOSIS — E785 Hyperlipidemia, unspecified: Secondary | ICD-10-CM | POA: Diagnosis not present

## 2021-04-15 DIAGNOSIS — I420 Dilated cardiomyopathy: Secondary | ICD-10-CM | POA: Diagnosis not present

## 2021-04-15 DIAGNOSIS — K56609 Unspecified intestinal obstruction, unspecified as to partial versus complete obstruction: Secondary | ICD-10-CM | POA: Diagnosis not present

## 2021-04-15 DIAGNOSIS — K4030 Unilateral inguinal hernia, with obstruction, without gangrene, not specified as recurrent: Secondary | ICD-10-CM | POA: Diagnosis not present

## 2021-04-15 DIAGNOSIS — J449 Chronic obstructive pulmonary disease, unspecified: Secondary | ICD-10-CM | POA: Diagnosis not present

## 2021-04-15 NOTE — Telephone Encounter (Signed)
Patient caretaker brought in proof of income  Placed in nurse box

## 2021-04-18 DIAGNOSIS — K219 Gastro-esophageal reflux disease without esophagitis: Secondary | ICD-10-CM | POA: Diagnosis not present

## 2021-04-18 DIAGNOSIS — E785 Hyperlipidemia, unspecified: Secondary | ICD-10-CM | POA: Diagnosis not present

## 2021-04-18 DIAGNOSIS — Z7982 Long term (current) use of aspirin: Secondary | ICD-10-CM | POA: Diagnosis not present

## 2021-04-18 DIAGNOSIS — K4090 Unilateral inguinal hernia, without obstruction or gangrene, not specified as recurrent: Secondary | ICD-10-CM | POA: Diagnosis not present

## 2021-04-18 DIAGNOSIS — G709 Myoneural disorder, unspecified: Secondary | ICD-10-CM | POA: Diagnosis not present

## 2021-04-18 DIAGNOSIS — Z8546 Personal history of malignant neoplasm of prostate: Secondary | ICD-10-CM | POA: Diagnosis not present

## 2021-04-18 DIAGNOSIS — I110 Hypertensive heart disease with heart failure: Secondary | ICD-10-CM | POA: Diagnosis not present

## 2021-04-18 DIAGNOSIS — J189 Pneumonia, unspecified organism: Secondary | ICD-10-CM | POA: Diagnosis not present

## 2021-04-18 DIAGNOSIS — I252 Old myocardial infarction: Secondary | ICD-10-CM | POA: Diagnosis not present

## 2021-04-18 DIAGNOSIS — I713 Abdominal aortic aneurysm, ruptured, unspecified: Secondary | ICD-10-CM | POA: Diagnosis not present

## 2021-04-18 DIAGNOSIS — Z8673 Personal history of transient ischemic attack (TIA), and cerebral infarction without residual deficits: Secondary | ICD-10-CM | POA: Diagnosis not present

## 2021-04-18 DIAGNOSIS — J432 Centrilobular emphysema: Secondary | ICD-10-CM | POA: Diagnosis not present

## 2021-04-18 DIAGNOSIS — I2510 Atherosclerotic heart disease of native coronary artery without angina pectoris: Secondary | ICD-10-CM | POA: Diagnosis not present

## 2021-04-18 DIAGNOSIS — I739 Peripheral vascular disease, unspecified: Secondary | ICD-10-CM | POA: Diagnosis not present

## 2021-04-18 DIAGNOSIS — I5042 Chronic combined systolic (congestive) and diastolic (congestive) heart failure: Secondary | ICD-10-CM | POA: Diagnosis not present

## 2021-04-18 DIAGNOSIS — Z9981 Dependence on supplemental oxygen: Secondary | ICD-10-CM | POA: Diagnosis not present

## 2021-04-19 NOTE — Telephone Encounter (Signed)
Patient calling the office for samples of medication:   1.  What medication and dosage are you requesting samples for? Entresto 97-103   2.  Are you currently out of this medication?  yes

## 2021-04-19 NOTE — Telephone Encounter (Signed)
Entresto 97-103 mg  Received [place] application Completed provider's portion, attached copy of insurance card and med list Dr. Rockey Situ has signed Forms faxed and placed in file cabinet  Received "successful" fax confirmation  2 bottles samples given Entresto 49-51 mg Take two tabs BID for equivalent proper dosing d/t to not having samples for the 97/103 mg  LOT: [id number] EXP: 11/24

## 2021-04-20 DIAGNOSIS — J431 Panlobular emphysema: Secondary | ICD-10-CM | POA: Diagnosis not present

## 2021-04-20 DIAGNOSIS — I420 Dilated cardiomyopathy: Secondary | ICD-10-CM | POA: Diagnosis not present

## 2021-04-20 DIAGNOSIS — J9611 Chronic respiratory failure with hypoxia: Secondary | ICD-10-CM | POA: Diagnosis not present

## 2021-04-21 ENCOUNTER — Telehealth: Payer: Self-pay

## 2021-04-21 NOTE — Telephone Encounter (Signed)
Income fax from [employer]  "We have received the enrollment application for [name], but we are missing the following information from you: Missing Household size Missing proof of income please attach IRS form (e.g. 8599, etc)"  SS was sent in with pt's application as income, reviewed application, everything looked to have been completed as requested  Attempted to reach out to [name], unable to make contact LDM  [employer] sent fax stating they need additional information and to verify [name]'s household size and any additional income source  Gave Patient ID: [id number] [name] [phone]

## 2021-04-24 ENCOUNTER — Other Ambulatory Visit: Payer: Self-pay

## 2021-04-24 ENCOUNTER — Inpatient Hospital Stay (HOSPITAL_COMMUNITY)
Admission: EM | Admit: 2021-04-24 | Discharge: 2021-04-28 | DRG: 291 | Disposition: A | Discharge status: Home Health | Payer: PPO | Attending: Internal Medicine | Admitting: Internal Medicine

## 2021-04-24 ENCOUNTER — Encounter (HOSPITAL_COMMUNITY): Payer: Self-pay | Admitting: Emergency Medicine

## 2021-04-24 ENCOUNTER — Emergency Department (HOSPITAL_COMMUNITY): Payer: PPO

## 2021-04-24 DIAGNOSIS — I517 Cardiomegaly: Secondary | ICD-10-CM | POA: Diagnosis not present

## 2021-04-24 DIAGNOSIS — I619 Nontraumatic intracerebral hemorrhage, unspecified: Secondary | ICD-10-CM | POA: Diagnosis present

## 2021-04-24 DIAGNOSIS — J9601 Acute respiratory failure with hypoxia: Secondary | ICD-10-CM | POA: Diagnosis not present

## 2021-04-24 DIAGNOSIS — Z720 Tobacco use: Secondary | ICD-10-CM | POA: Diagnosis present

## 2021-04-24 DIAGNOSIS — Z79899 Other long term (current) drug therapy: Secondary | ICD-10-CM

## 2021-04-24 DIAGNOSIS — Z833 Family history of diabetes mellitus: Secondary | ICD-10-CM | POA: Diagnosis not present

## 2021-04-24 DIAGNOSIS — I5043 Acute on chronic combined systolic (congestive) and diastolic (congestive) heart failure: Secondary | ICD-10-CM | POA: Diagnosis not present

## 2021-04-24 DIAGNOSIS — Z9981 Dependence on supplemental oxygen: Secondary | ICD-10-CM | POA: Diagnosis not present

## 2021-04-24 DIAGNOSIS — I248 Other forms of acute ischemic heart disease: Secondary | ICD-10-CM | POA: Diagnosis not present

## 2021-04-24 DIAGNOSIS — R0602 Shortness of breath: Secondary | ICD-10-CM | POA: Diagnosis not present

## 2021-04-24 DIAGNOSIS — R911 Solitary pulmonary nodule: Secondary | ICD-10-CM | POA: Diagnosis present

## 2021-04-24 DIAGNOSIS — E785 Hyperlipidemia, unspecified: Secondary | ICD-10-CM | POA: Diagnosis present

## 2021-04-24 DIAGNOSIS — I252 Old myocardial infarction: Secondary | ICD-10-CM | POA: Diagnosis not present

## 2021-04-24 DIAGNOSIS — I739 Peripheral vascular disease, unspecified: Secondary | ICD-10-CM | POA: Diagnosis not present

## 2021-04-24 DIAGNOSIS — R0689 Other abnormalities of breathing: Secondary | ICD-10-CM | POA: Diagnosis not present

## 2021-04-24 DIAGNOSIS — I110 Hypertensive heart disease with heart failure: Principal | ICD-10-CM | POA: Diagnosis present

## 2021-04-24 DIAGNOSIS — Z8249 Family history of ischemic heart disease and other diseases of the circulatory system: Secondary | ICD-10-CM | POA: Diagnosis not present

## 2021-04-24 DIAGNOSIS — I2510 Atherosclerotic heart disease of native coronary artery without angina pectoris: Secondary | ICD-10-CM | POA: Diagnosis not present

## 2021-04-24 DIAGNOSIS — J441 Chronic obstructive pulmonary disease with (acute) exacerbation: Secondary | ICD-10-CM | POA: Diagnosis not present

## 2021-04-24 DIAGNOSIS — I5023 Acute on chronic systolic (congestive) heart failure: Secondary | ICD-10-CM | POA: Diagnosis present

## 2021-04-24 DIAGNOSIS — D539 Nutritional anemia, unspecified: Secondary | ICD-10-CM | POA: Diagnosis not present

## 2021-04-24 DIAGNOSIS — J449 Chronic obstructive pulmonary disease, unspecified: Secondary | ICD-10-CM | POA: Diagnosis present

## 2021-04-24 DIAGNOSIS — I10 Essential (primary) hypertension: Secondary | ICD-10-CM | POA: Diagnosis not present

## 2021-04-24 DIAGNOSIS — I714 Abdominal aortic aneurysm, without rupture, unspecified: Secondary | ICD-10-CM | POA: Diagnosis not present

## 2021-04-24 DIAGNOSIS — J811 Chronic pulmonary edema: Secondary | ICD-10-CM | POA: Diagnosis not present

## 2021-04-24 DIAGNOSIS — R0902 Hypoxemia: Secondary | ICD-10-CM | POA: Diagnosis not present

## 2021-04-24 DIAGNOSIS — Z87891 Personal history of nicotine dependence: Secondary | ICD-10-CM | POA: Diagnosis not present

## 2021-04-24 DIAGNOSIS — Z8679 Personal history of other diseases of the circulatory system: Secondary | ICD-10-CM | POA: Diagnosis not present

## 2021-04-24 DIAGNOSIS — R7989 Other specified abnormal findings of blood chemistry: Secondary | ICD-10-CM | POA: Diagnosis present

## 2021-04-24 DIAGNOSIS — R778 Other specified abnormalities of plasma proteins: Secondary | ICD-10-CM | POA: Diagnosis not present

## 2021-04-24 DIAGNOSIS — J189 Pneumonia, unspecified organism: Secondary | ICD-10-CM | POA: Diagnosis not present

## 2021-04-24 DIAGNOSIS — Z801 Family history of malignant neoplasm of trachea, bronchus and lung: Secondary | ICD-10-CM | POA: Diagnosis not present

## 2021-04-24 DIAGNOSIS — Z8546 Personal history of malignant neoplasm of prostate: Secondary | ICD-10-CM

## 2021-04-24 DIAGNOSIS — J9621 Acute and chronic respiratory failure with hypoxia: Secondary | ICD-10-CM | POA: Diagnosis present

## 2021-04-24 DIAGNOSIS — Z66 Do not resuscitate: Secondary | ICD-10-CM | POA: Diagnosis present

## 2021-04-24 DIAGNOSIS — I420 Dilated cardiomyopathy: Secondary | ICD-10-CM | POA: Diagnosis not present

## 2021-04-24 DIAGNOSIS — J432 Centrilobular emphysema: Secondary | ICD-10-CM | POA: Diagnosis not present

## 2021-04-24 DIAGNOSIS — Z888 Allergy status to other drugs, medicaments and biological substances status: Secondary | ICD-10-CM

## 2021-04-24 DIAGNOSIS — K219 Gastro-esophageal reflux disease without esophagitis: Secondary | ICD-10-CM | POA: Diagnosis not present

## 2021-04-24 DIAGNOSIS — I5042 Chronic combined systolic (congestive) and diastolic (congestive) heart failure: Secondary | ICD-10-CM

## 2021-04-24 DIAGNOSIS — Z8673 Personal history of transient ischemic attack (TIA), and cerebral infarction without residual deficits: Secondary | ICD-10-CM

## 2021-04-24 DIAGNOSIS — K4090 Unilateral inguinal hernia, without obstruction or gangrene, not specified as recurrent: Secondary | ICD-10-CM | POA: Diagnosis not present

## 2021-04-24 DIAGNOSIS — Z7951 Long term (current) use of inhaled steroids: Secondary | ICD-10-CM

## 2021-04-24 DIAGNOSIS — Z20822 Contact with and (suspected) exposure to covid-19: Secondary | ICD-10-CM | POA: Diagnosis present

## 2021-04-24 DIAGNOSIS — D649 Anemia, unspecified: Secondary | ICD-10-CM | POA: Diagnosis present

## 2021-04-24 LAB — BRAIN NATRIURETIC PEPTIDE: B Natriuretic Peptide: 3545.6 pg/mL — ABNORMAL HIGH (ref 0.0–100.0)

## 2021-04-24 LAB — COMPREHENSIVE METABOLIC PANEL
ALT: 23 U/L (ref 0–44)
AST: 20 U/L (ref 15–41)
Albumin: 2.6 g/dL — ABNORMAL LOW (ref 3.5–5.0)
Alkaline Phosphatase: 67 U/L (ref 38–126)
Anion gap: 4 — ABNORMAL LOW (ref 5–15)
BUN: 11 mg/dL (ref 8–23)
CO2: 34 mmol/L — ABNORMAL HIGH (ref 22–32)
Calcium: 8.2 mg/dL — ABNORMAL LOW (ref 8.9–10.3)
Chloride: 102 mmol/L (ref 98–111)
Creatinine, Ser: 0.88 mg/dL (ref 0.61–1.24)
GFR, Estimated: >60 mL/min (ref >60)
Glucose, Bld: 125 mg/dL — ABNORMAL HIGH (ref 70–99)
Potassium: 3.9 mmol/L (ref 3.5–5.1)
Sodium: 140 mmol/L (ref 135–145)
Total Bilirubin: 1.2 mg/dL (ref 0.3–1.2)
Total Protein: 5.4 g/dL — ABNORMAL LOW (ref 6.5–8.1)

## 2021-04-24 LAB — CBC WITH DIFFERENTIAL/PLATELET
Abs Immature Granulocytes: 0.02 10*3/uL (ref 0.00–0.07)
Basophils Absolute: 0 10*3/uL (ref 0.0–0.1)
Basophils Relative: 0 %
Eosinophils Absolute: 0.3 10*3/uL (ref 0.0–0.5)
Eosinophils Relative: 4 %
HCT: 35.3 % — ABNORMAL LOW (ref 39.0–52.0)
Hemoglobin: 10.9 g/dL — ABNORMAL LOW (ref 13.0–17.0)
Immature Granulocytes: 0 %
Lymphocytes Relative: 17 %
Lymphs Abs: 1.1 10*3/uL (ref 0.7–4.0)
MCH: 31.4 pg (ref 26.0–34.0)
MCHC: 30.9 g/dL (ref 30.0–36.0)
MCV: 101.7 fL — ABNORMAL HIGH (ref 80.0–100.0)
Monocytes Absolute: 0.5 10*3/uL (ref 0.1–1.0)
Monocytes Relative: 7 %
Neutro Abs: 4.8 10*3/uL (ref 1.7–7.7)
Neutrophils Relative %: 72 %
Platelets: 138 10*3/uL — ABNORMAL LOW (ref 150–400)
RBC: 3.47 MIL/uL — ABNORMAL LOW (ref 4.22–5.81)
RDW: 14.7 % (ref 11.5–15.5)
WBC: 6.7 10*3/uL (ref 4.0–10.5)
nRBC: 0 % (ref 0.0–0.2)

## 2021-04-24 LAB — I-STAT VENOUS BLOOD GAS, ED
Acid-Base Excess: 10 mmol/L — ABNORMAL HIGH (ref 0.0–2.0)
Bicarbonate: 36.1 mmol/L — ABNORMAL HIGH (ref 20.0–28.0)
Calcium, Ion: 1.09 mmol/L — ABNORMAL LOW (ref 1.15–1.40)
HCT: 31 % — ABNORMAL LOW (ref 39.0–52.0)
Hemoglobin: 10.5 g/dL — ABNORMAL LOW (ref 13.0–17.0)
O2 Saturation: 78 %
Potassium: 3.6 mmol/L (ref 3.5–5.1)
Sodium: 143 mmol/L (ref 135–145)
TCO2: 38 mmol/L — ABNORMAL HIGH (ref 22–32)
pCO2, Ven: 56.1 mmHg (ref 44.0–60.0)
pH, Ven: 7.416 (ref 7.250–7.430)
pO2, Ven: 43 mmHg (ref 32.0–45.0)

## 2021-04-24 LAB — TROPONIN I (HIGH SENSITIVITY)
Troponin I (High Sensitivity): 372 ng/L (ref <18)
Troponin I (High Sensitivity): 448 ng/L (ref <18)
Troponin I (High Sensitivity): 374 ng/L (ref <18)
Troponin I (High Sensitivity): 390 ng/L (ref <18)

## 2021-04-24 LAB — I-STAT CHEM 8, ED
BUN: 13 mg/dL (ref 8–23)
Calcium, Ion: 1.12 mmol/L — ABNORMAL LOW (ref 1.15–1.40)
Chloride: 100 mmol/L (ref 98–111)
Creatinine, Ser: 0.8 mg/dL (ref 0.61–1.24)
Glucose, Bld: 118 mg/dL — ABNORMAL HIGH (ref 70–99)
HCT: 34 % — ABNORMAL LOW (ref 39.0–52.0)
Hemoglobin: 11.6 g/dL — ABNORMAL LOW (ref 13.0–17.0)
Potassium: 3.9 mmol/L (ref 3.5–5.1)
Sodium: 143 mmol/L (ref 135–145)
TCO2: 33 mmol/L — ABNORMAL HIGH (ref 22–32)

## 2021-04-24 LAB — RESPIRATORY PANEL BY PCR

## 2021-04-24 LAB — VITAMIN B12: Vitamin B-12: 475 pg/mL (ref 180–914)

## 2021-04-24 LAB — IRON AND TIBC
Iron: 35 ug/dL — ABNORMAL LOW (ref 45–182)
Saturation Ratios: 16 % — ABNORMAL LOW (ref 17.9–39.5)
TIBC: 214 ug/dL — ABNORMAL LOW (ref 250–450)
UIBC: 179 ug/dL

## 2021-04-24 LAB — FOLATE: Folate: 13.2 ng/mL (ref >5.9)

## 2021-04-24 LAB — RESP PANEL BY RT-PCR (FLU A&B, COVID) ARPGX2
Influenza A by PCR: NEGATIVE
Influenza B by PCR: NEGATIVE
SARS Coronavirus 2 by RT PCR: NEGATIVE

## 2021-04-24 LAB — FERRITIN: Ferritin: 278 ng/mL (ref 24–336)

## 2021-04-24 LAB — MAGNESIUM: Magnesium: 1.5 mg/dL — ABNORMAL LOW (ref 1.7–2.4)

## 2021-04-24 MED ORDER — IPRATROPIUM-ALBUTEROL 0.5-2.5 (3) MG/3ML IN SOLN
3.0000 mL | Freq: Once | RESPIRATORY_TRACT | Status: AC
  Administered 2021-04-24: 3 mL via RESPIRATORY_TRACT
  Filled 2021-04-24: qty 3

## 2021-04-24 MED ORDER — ACETAMINOPHEN 325 MG PO TABS
650.0000 mg | ORAL_TABLET | Freq: Four times a day (QID) | ORAL | Status: DC | PRN
  Administered 2021-04-26: 650 mg via ORAL
  Filled 2021-04-24: qty 2

## 2021-04-24 MED ORDER — FUROSEMIDE 10 MG/ML IJ SOLN: 20.0000 mg | Freq: Once | INTRAMUSCULAR | Status: DC

## 2021-04-24 MED ORDER — ASPIRIN 81 MG PO CHEW
324.0000 mg | CHEWABLE_TABLET | Freq: Once | ORAL | Status: AC
  Administered 2021-04-24: 324 mg via ORAL
  Filled 2021-04-24: qty 4

## 2021-04-24 MED ORDER — METHYLPREDNISOLONE SODIUM SUCC 125 MG IJ SOLR
80.0000 mg | Freq: Two times a day (BID) | INTRAMUSCULAR | Status: DC
  Administered 2021-04-25: 80 mg via INTRAVENOUS
  Filled 2021-04-24: qty 2

## 2021-04-24 MED ORDER — FUROSEMIDE 10 MG/ML IJ SOLN
40.0000 mg | Freq: Once | INTRAMUSCULAR | Status: AC
  Administered 2021-04-24: 40 mg via INTRAVENOUS
  Filled 2021-04-24: qty 4

## 2021-04-24 MED ORDER — ATORVASTATIN CALCIUM 80 MG PO TABS
80.0000 mg | ORAL_TABLET | Freq: Every day | ORAL | Status: DC
  Administered 2021-04-25 – 2021-04-28 (×4): 80 mg via ORAL
  Filled 2021-04-24 (×4): qty 1

## 2021-04-24 MED ORDER — SODIUM CHLORIDE 0.9 % IV SOLN: 250.0000 mL | INTRAVENOUS | Status: DC | PRN

## 2021-04-24 MED ORDER — MOMETASONE FURO-FORMOTEROL FUM 200-5 MCG/ACT IN AERO
2.0000 | INHALATION_SPRAY | Freq: Two times a day (BID) | RESPIRATORY_TRACT | Status: DC
  Administered 2021-04-24 – 2021-04-28 (×7): 2 via RESPIRATORY_TRACT
  Filled 2021-04-24: qty 8.8

## 2021-04-24 MED ORDER — METHYLPREDNISOLONE SODIUM SUCC 125 MG IJ SOLR
125.0000 mg | Freq: Once | INTRAMUSCULAR | Status: AC
  Administered 2021-04-24: 125 mg via INTRAVENOUS
  Filled 2021-04-24: qty 2

## 2021-04-24 MED ORDER — SODIUM CHLORIDE 0.9% FLUSH: 3.0000 mL | INTRAVENOUS | Status: DC | PRN

## 2021-04-24 MED ORDER — ACETAMINOPHEN 650 MG RE SUPP: 650.0000 mg | Freq: Four times a day (QID) | RECTAL | Status: DC | PRN

## 2021-04-24 MED ORDER — SODIUM CHLORIDE 0.9 % IV SOLN
1.0000 g | INTRAVENOUS | Status: DC
  Administered 2021-04-24 – 2021-04-26 (×3): 1 g via INTRAVENOUS
  Filled 2021-04-24 (×3): qty 10

## 2021-04-24 MED ORDER — FUROSEMIDE 10 MG/ML IJ SOLN: 40.0000 mg | Freq: Every day | INTRAMUSCULAR | Status: DC

## 2021-04-24 MED ORDER — MAGNESIUM SULFATE 2 GM/50ML IV SOLN
2.0000 g | Freq: Once | INTRAVENOUS | Status: AC
  Administered 2021-04-24: 2 g via INTRAVENOUS
  Filled 2021-04-24: qty 50

## 2021-04-24 MED ORDER — IPRATROPIUM-ALBUTEROL 0.5-2.5 (3) MG/3ML IN SOLN
3.0000 mL | Freq: Four times a day (QID) | RESPIRATORY_TRACT | Status: DC
  Administered 2021-04-25: 3 mL via RESPIRATORY_TRACT
  Filled 2021-04-24: qty 3

## 2021-04-24 MED ORDER — CARVEDILOL 3.125 MG PO TABS
3.1250 mg | ORAL_TABLET | Freq: Two times a day (BID) | ORAL | Status: DC
  Administered 2021-04-24 – 2021-04-28 (×8): 3.125 mg via ORAL
  Filled 2021-04-24 (×8): qty 1

## 2021-04-24 MED ORDER — PANTOPRAZOLE SODIUM 40 MG PO TBEC
80.0000 mg | DELAYED_RELEASE_TABLET | Freq: Every day | ORAL | Status: DC
  Administered 2021-04-25 – 2021-04-28 (×4): 80 mg via ORAL
  Filled 2021-04-24 (×4): qty 2

## 2021-04-24 MED ORDER — IPRATROPIUM-ALBUTEROL 0.5-2.5 (3) MG/3ML IN SOLN
3.0000 mL | Freq: Four times a day (QID) | RESPIRATORY_TRACT | Status: DC
  Administered 2021-04-24: 3 mL via RESPIRATORY_TRACT
  Filled 2021-04-24: qty 3

## 2021-04-24 MED ORDER — ALBUTEROL SULFATE (2.5 MG/3ML) 0.083% IN NEBU
2.5000 mg | INHALATION_SOLUTION | RESPIRATORY_TRACT | Status: DC | PRN
  Administered 2021-04-26: 2.5 mg via RESPIRATORY_TRACT
  Filled 2021-04-24: qty 3

## 2021-04-24 MED ORDER — METHYLPREDNISOLONE SODIUM SUCC 125 MG IJ SOLR: 125.0000 mg | INTRAMUSCULAR | Status: DC

## 2021-04-24 MED ORDER — SACUBITRIL-VALSARTAN 97-103 MG PO TABS
1.0000 | ORAL_TABLET | Freq: Two times a day (BID) | ORAL | Status: DC
  Administered 2021-04-24 – 2021-04-28 (×8): 1 via ORAL
  Filled 2021-04-24 (×9): qty 1

## 2021-04-24 MED ORDER — SODIUM CHLORIDE 0.9% FLUSH
3.0000 mL | Freq: Two times a day (BID) | INTRAVENOUS | Status: DC
  Administered 2021-04-24 – 2021-04-27 (×7): 3 mL via INTRAVENOUS

## 2021-04-24 MED ORDER — MAGNESIUM OXIDE -MG SUPPLEMENT 400 (240 MG) MG PO TABS
400.0000 mg | ORAL_TABLET | Freq: Once | ORAL | Status: AC
  Administered 2021-04-24: 400 mg via ORAL
  Filled 2021-04-24: qty 1

## 2021-04-24 NOTE — Assessment & Plan Note (Signed)
3.8 cm -s/p aortobi-iliac stent graft, patent -Severe atherosclerotic disease on CT

## 2021-04-24 NOTE — ED Notes (Signed)
Troponin of 372 reported to Dr. Laverta Baltimore

## 2021-04-24 NOTE — ED Triage Notes (Signed)
Pt brought in by EMS for shortness of breath. Pt has a history of CHF and COPD. Initial O2 sats were 88%. Pt receiving oxygen via non-rebreather at 10 lpm.

## 2021-04-24 NOTE — Assessment & Plan Note (Signed)
Continue entresto, coreg

## 2021-04-24 NOTE — ED Provider Notes (Signed)
Blood pressure 109/64, pulse 80, temperature 97.8 F (36.6 C), temperature source Temporal, resp. rate 14, height 6' (1.829 m), weight 75.7 kg, SpO2 93 %.  Assuming care from Dr. Leonette Monarch.  In short, [name] is a 76 y.o. male with a chief complaint of Shortness of Breath .  Refer to the original H&P for additional details.  The current plan of care is to follow up on labs and reassess.  09:00 AM  Patient appears well overall. Feeling "much better" on reassessment.  Patient wants kidney function is within normal limits.  Troponin coming back at 372.  He is not having active chest pain.  In comparison to prior lab values in our system this is significantly elevated.  Could be related to demand ischemia from CHF exacerbation.  COPD likely playing a role although I do not hear much wheezing.  He is diuresing here well after IV Lasix.  Plan for repeat EKG, full dose aspirin, will discuss with cardiology.    EKG Interpretation  Date/Time:  Sunday April 24 2021 05:56:17 EST Ventricular Rate:  81 PR Interval:  191 QRS Duration: 96 QT Interval:  415 QTC Calculation: 482 R Axis:   9 Text Interpretation: Sinus rhythm Paired ventricular premature complexes Nonspecific T abnormalities, lateral leads Borderline prolonged QT interval Confirmed by Cardama, Pedro ([id number]) on 04/24/2021 6:19:21 AM       10 :59 AM  Spoke with Dr. Harrington Challenger with cardiology.  Would continue to trend troponins.  No indication for heparin.  Suspect demand ischemia clinically.  Advises that if medicine team would like a cardiology consult they can evaluate and call back.   Discussed patient's case with [facility] to request admission. Patient and family (if present) updated with plan. Care transferred to [facility] service.  I reviewed all nursing notes, vitals, pertinent old records, EKGs, labs, imaging (as available).     Margette Fast, MD 04/25/21 [phone]

## 2021-04-24 NOTE — Assessment & Plan Note (Signed)
-  typically on 2L [place]; however, was requiring 10L on NRB mask -after steroids, duonebs and IV diuresis he is back to 2-3 L [place]; however, requires more with ambulation -continue to treat CHF exacerbation/copd exacerbation and will likely need ambulatory test.

## 2021-04-24 NOTE — Assessment & Plan Note (Signed)
Continue PPI ?

## 2021-04-24 NOTE — Assessment & Plan Note (Signed)
Below baseline, have not seen iron studies Iron studies pending

## 2021-04-24 NOTE — ED Notes (Signed)
ED TO INPATIENT HANDOFF REPORT  ED Nurse Name and Phone #: Keshona Kartes, 47  S Name/Age/Gender [name] 76 y.o. male Room/Bed: RESUSC/RESUSC  Code Status   Code Status: DNR  Home/SNF/Other Home Patient oriented to: self, place, time, and situation Is this baseline? Yes   Triage Complete: Triage complete  Chief Complaint Acute on chronic systolic CHF (congestive heart failure) (HCC) [I50.23] Acute on chronic combined systolic and diastolic CHF (congestive heart failure) ([place]) [I50.43]  Triage Note Pt brought in by EMS for shortness of breath. Pt has a history of CHF and COPD. Initial O2 sats were 88%. Pt receiving oxygen via non-rebreather at 10 lpm.   Allergies Allergies  Allergen Reactions   Lyrica [Pregabalin] Swelling        Prednisone Other (See Comments)    Feels like having a heart attack. Has had the injection form before and does well. Oral Steroids    Level of Care/Admitting Diagnosis ED Disposition     ED Disposition  Admit   Condition  --   Comment  Hospital Area: [place] [[id number]]  Level of Care: Telemetry Medical [[id number]]  May admit patient to [facility] or [facility] if equivalent level of care is available:: Yes  Covid Evaluation: Asymptomatic Screening Protocol (No Symptoms)  Diagnosis: Acute on chronic combined systolic and diastolic CHF (congestive heart failure) [facility]) [[id number]]  Admitting Physician: Orma Flaming [[id number]]  Attending Physician: Orma Flaming [[id number]]  Estimated length of stay: past midnight tomorrow  Certification:: I certify this patient will need inpatient services for at least 2 midnights          B Medical/Surgery History Past Medical History:  Diagnosis Date   AAA (abdominal aortic aneurysm)    a.  CTA 7/19: measured 4.5 x 5.3 cm and greatest transverse dimensions   Abnormal nuclear stress test    a.  Myoview 2012: anterior wall ischemia with an estimated EF of 26%.  This was a new wall  motion abnormality as well as a newly reduced EF   COPD (chronic obstructive pulmonary disease) (HCC)    Coronary artery disease    a.  Posterior MI in 2002 status post BMS to the LCx; b. [place] 2012: 95% stenosis of the proximal LAD, 95% stenosis of the mid LAD, 30% in-stent restenosis of the mid left circumflex with a second lesion of diffuse 50% stenosis.  The patient underwent successful PCI/BMS to the mid LAD with 0% residual stenosis, LV gram not performed   GERD (gastroesophageal reflux disease)    History of kidney stones    History of prostate cancer    Hyperlipidemia    Hypertension    Myocardial infarction [facility])    Neuromuscular disorder ([place])    Prostate cancer ([place])    a.  Status post seeding   PVD (peripheral vascular disease) ([place])    Stroke [facility])    Past Surgical History:  Procedure Laterality Date   BACK SURGERY  2009/2010   x 2   BOWEL RESECTION N/A 03/18/2021   Procedure: SMALL BOWEL RESECTION;  Surgeon: Jesusita Oka, MD;  Location: [facility] OR;  Service: General;  Laterality: N/A;   CARDIAC CATHETERIZATION  2002   stent placement [place]   ENDOVASCULAR REPAIR/STENT GRAFT N/A 01/02/2018   Procedure: ENDOVASCULAR REPAIR/STENT GRAFT;  Surgeon: Algernon Huxley, MD;  Location: [facility] LAB;  Service: Cardiovascular;  Laterality: N/A;   FOOT SURGERY     bilateral    HERNIA REPAIR  bilateral    INGUINAL HERNIA REPAIR Left 03/18/2021   Procedure: INGUINAL HERNIA REPAIR;  Surgeon: Jesusita Oka, MD;  Location: [facility];  Service: General;  Laterality: Left;   INSERTION OF MESH Left 03/18/2021   Procedure: INSERTION OF MESH;  Surgeon: Jesusita Oka, MD;  Location: [place];  Service: General;  Laterality: Left;   KNEE SURGERY     right knee    PROSTATE SURGERY  09/2009   prostate implant   [place] N/A 03/18/2021   Procedure: UMBILICAL HERNIA REPAIR;  Surgeon: Jesusita Oka, MD;  Location: [place];  Service: General;  Laterality:  N/A;     A IV Location/Drains/Wounds Patient Lines/Drains/Airways Status     Active Line/Drains/Airways     Name Placement date Placement time Site Days   Peripheral IV 04/24/21 18 G Anterior;Distal;Left;Upper Arm 04/24/21  0508  Arm  less than 1   External Urinary Catheter 03/27/21  0032  --  28   Incision (Closed) 03/18/21 Abdomen 03/18/21  1803  -- 37   Pressure Injury 02/26/21 Coccyx Mid;Posterior Stage 2 -  Partial thickness loss of dermis presenting as a shallow open injury with a red, pink wound bed without slough. 02/26/21  1702  -- 57   Wound / Incision (Open or Dehisced) 03/26/21 Incision - Open Abdomen Lower;Medial stapled surgical wound 03/26/21  2037  Abdomen  29            Intake/Output Last 24 hours  Intake/Output Summary (Last 24 hours) at 04/24/2021 1402 Last data filed at 04/24/2021 1254 Gross per 24 hour  Intake --  Output 1750 ml  Net -1750 ml    Labs/Imaging Results for orders placed or performed during the hospital encounter of 04/24/21 (from the past 48 hour(s))  Brain natriuretic peptide     Status: Abnormal   Collection Time: 04/24/21  6:15 AM  Result Value Ref Range   B Natriuretic Peptide 3,545.6 (H) 0.0 - 100.0 pg/mL    Comment: Performed at [facility], [place] [address], [place], [place] [address]  CBC with Differential/Platelet     Status: Abnormal   Collection Time: 04/24/21  6:45 AM  Result Value Ref Range   WBC 6.7 4.0 - 10.5 K/uL   RBC 3.47 (L) 4.22 - 5.81 MIL/uL   Hemoglobin 10.9 (L) 13.0 - 17.0 g/dL   HCT 35.3 (L) 39.0 - 52.0 %   MCV 101.7 (H) 80.0 - 100.0 fL   MCH 31.4 26.0 - 34.0 pg   MCHC 30.9 30.0 - 36.0 g/dL   RDW 14.7 11.5 - 15.5 %   Platelets 138 (L) 150 - 400 K/uL    Comment: REPEATED TO VERIFY   nRBC 0.0 0.0 - 0.2 %   Neutrophils Relative % 72 %   Neutro Abs 4.8 1.7 - 7.7 K/uL   Lymphocytes Relative 17 %   Lymphs Abs 1.1 0.7 - 4.0 K/uL   Monocytes Relative 7 %   Monocytes Absolute 0.5 0.1 - 1.0 K/uL   Eosinophils  Relative 4 %   Eosinophils Absolute 0.3 0.0 - 0.5 K/uL   Basophils Relative 0 %   Basophils Absolute 0.0 0.0 - 0.1 K/uL   Immature Granulocytes 0 %   Abs Immature Granulocytes 0.02 0.00 - 0.07 K/uL    Comment: Performed at [facility], [place] [address] [place], [place] [address]  Comprehensive metabolic panel     Status: Abnormal  Collection Time: 04/24/21  6:45 AM  Result Value Ref Range   Sodium 140 135 - 145 mmol/L   Potassium 3.9 3.5 - 5.1 mmol/L   Chloride 102 98 - 111 mmol/L   CO2 34 (H) 22 - 32 mmol/L   Glucose, Bld 125 (H) 70 - 99 mg/dL    Comment: Glucose reference range applies only to samples taken after fasting for at least 8 hours.   BUN 11 8 - 23 mg/dL   Creatinine, Ser 0.88 0.61 - 1.24 mg/dL   Calcium 8.2 (L) 8.9 - 10.3 mg/dL   Total Protein 5.4 (L) 6.5 - 8.1 g/dL   Albumin 2.6 (L) 3.5 - 5.0 g/dL   AST 20 15 - 41 U/L   ALT 23 0 - 44 U/L   Alkaline Phosphatase 67 38 - 126 U/L   Total Bilirubin 1.2 0.3 - 1.2 mg/dL   GFR, Estimated >60 >60 mL/min    Comment: (NOTE) Calculated using the CKD-EPI Creatinine Equation (2021)    Anion gap 4 (L) 5 - 15    Comment: Performed at [facility], [place] [address], [place], [place] [address]  Troponin I (High Sensitivity)     Status: Abnormal   Collection Time: 04/24/21  6:45 AM  Result Value Ref Range   Troponin I (High Sensitivity) 372 (HH) <18 ng/L    Comment: CRITICAL RESULT CALLED TO, READ BACK BY AND VERIFIED WITH: J.BLUE RN @ [phone] 04/24/2021 BY C.EDENS (NOTE) Elevated high sensitivity troponin I (hsTnI) values and significant  changes across serial measurements may suggest ACS but many other  chronic and acute conditions are known to elevate hsTnI results.  Refer to the Links section for chest pain algorithms and additional  guidance. Performed at [facility], [place] [address], [place], [place] [address]   I-stat chem 8, ED (not at [facility] or [facility])     Status: Abnormal   Collection Time: 04/24/21  6:53 AM   Result Value Ref Range   Sodium 143 135 - 145 mmol/L   Potassium 3.9 3.5 - 5.1 mmol/L   Chloride 100 98 - 111 mmol/L   BUN 13 8 - 23 mg/dL   Creatinine, Ser 0.80 0.61 - 1.24 mg/dL   Glucose, Bld 118 (H) 70 - 99 mg/dL    Comment: Glucose reference range applies only to samples taken after fasting for at least 8 hours.   Calcium, Ion 1.12 (L) 1.15 - 1.40 mmol/L   TCO2 33 (H) 22 - 32 mmol/L   Hemoglobin 11.6 (L) 13.0 - 17.0 g/dL   HCT 34.0 (L) 39.0 - 52.0 %  Resp Panel by RT-PCR (Flu A&B, Covid) Nasopharyngeal Swab     Status: None   Collection Time: 04/24/21  6:56 AM   Specimen: Nasopharyngeal Swab; Nasopharyngeal(NP) swabs in vial transport medium  Result Value Ref Range   SARS Coronavirus 2 by RT PCR NEGATIVE NEGATIVE    Comment: (NOTE) SARS-CoV-2 target nucleic acids are NOT DETECTED.  The SARS-CoV-2 RNA is generally detectable in upper respiratory specimens during the acute phase of infection. The lowest concentration of SARS-CoV-2 viral copies this assay can detect is 138 copies/mL. A negative result does not preclude SARS-Cov-2 infection and should not be used as the sole basis for treatment or other patient management decisions. A negative result may occur with  improper specimen collection/handling, submission of specimen other than nasopharyngeal swab, presence of viral mutation(s) within the areas targeted by this assay, and inadequate number of viral  copies(<138 copies/mL). A negative result must be combined with clinical observations, patient history, and epidemiological information. The expected result is Negative.  Fact Sheet for Patients:  EntrepreneurPulse.com.au  Fact Sheet for Healthcare Providers:  IncredibleEmployment.be  This test is no t yet approved or cleared by the [place] FDA and  has been authorized for detection and/or diagnosis of SARS-CoV-2 by FDA under an Emergency Use Authorization (EUA). This EUA will  remain  in effect (meaning this test can be used) for the duration of the COVID-19 declaration under Section 564(b)(1) of the Act, 21 U.S.C.section 360bbb-3(b)(1), unless the authorization is terminated  or revoked sooner.       Influenza A by PCR NEGATIVE NEGATIVE   Influenza B by PCR NEGATIVE NEGATIVE    Comment: (NOTE) The Xpert Xpress SARS-CoV-2/FLU/RSV plus assay is intended as an aid in the diagnosis of influenza from Nasopharyngeal swab specimens and should not be used as a sole basis for treatment. Nasal washings and aspirates are unacceptable for Xpert Xpress SARS-CoV-2/FLU/RSV testing.  Fact Sheet for Patients: EntrepreneurPulse.com.au  Fact Sheet for Healthcare Providers: IncredibleEmployment.be  This test is not yet approved or cleared by the [place] FDA and has been authorized for detection and/or diagnosis of SARS-CoV-2 by FDA under an Emergency Use Authorization (EUA). This EUA will remain in effect (meaning this test can be used) for the duration of the COVID-19 declaration under Section 564(b)(1) of the Act, 21 U.S.C. section 360bbb-3(b)(1), unless the authorization is terminated or revoked.  Performed at [facility], [place] [address], [place], [place] [address]   I-Stat venous blood gas, [facility] ED)     Status: Abnormal   Collection Time: 04/24/21  7:57 AM  Result Value Ref Range   pH, Ven 7.416 7.250 - 7.430   pCO2, Ven 56.1 44.0 - 60.0 mmHg   pO2, Ven 43.0 32.0 - 45.0 mmHg   Bicarbonate 36.1 (H) 20.0 - 28.0 mmol/L   TCO2 38 (H) 22 - 32 mmol/L   O2 Saturation 78.0 %   Acid-Base Excess 10.0 (H) 0.0 - 2.0 mmol/L   Sodium 143 135 - 145 mmol/L   Potassium 3.6 3.5 - 5.1 mmol/L   Calcium, Ion 1.09 (L) 1.15 - 1.40 mmol/L   HCT 31.0 (L) 39.0 - 52.0 %   Hemoglobin 10.5 (L) 13.0 - 17.0 g/dL   Sample type VENOUS   Troponin I (High Sensitivity)     Status: Abnormal   Collection Time: 04/24/21  8:15 AM  Result Value  Ref Range   Troponin I (High Sensitivity) 448 (HH) <18 ng/L    Comment: CRITICAL VALUE NOTED.  VALUE IS CONSISTENT WITH PREVIOUSLY REPORTED AND CALLED VALUE. (NOTE) Elevated high sensitivity troponin I (hsTnI) values and significant  changes across serial measurements may suggest ACS but many other  chronic and acute conditions are known to elevate hsTnI results.  Refer to the Links section for chest pain algorithms and additional  guidance. Performed at [facility], [place] [address] [place], [place] [address]    DG Chest Port 1 View  Result Date: 04/24/2021 CLINICAL DATA:  Shortness of breath EXAM: PORTABLE CHEST 1 VIEW COMPARISON:  Prior chest x-ray 03/18/2021 FINDINGS: Cardiomegaly. Increased pulmonary vascular congestion with perihilar and bibasilar airspace infiltrates. No large effusion. No pneumothorax. Atherosclerotic calcifications present in the transverse aorta. Questionable 7-8 mm nodule in the right upper lobe. IMPRESSION: 1. Increased pulmonary vascular congestion with bilateral perihilar and bibasilar interstitial and airspace opacities. Overall, the imaging appearance  is most suggestive of CHF. However, given relatively dependent distribution aspiration is a consideration in the appropriate clinical context. 2. Questionable 7-8 mm right upper lobe pulmonary nodule not evident on prior imaging. Consider follow-up with noncontrast enhanced CT scan of the chest following resolution of the patient's acute symptoms. Electronically Signed   By: Jacqulynn Cadet M.D.   On: 04/24/2021 06:43    Pending Labs Unresulted Labs (From admission, onward)     Start     Ordered   04/25/21 8546  Basic metabolic panel  Tomorrow morning,   R        04/24/21 1308   04/25/21 0500  CBC  Tomorrow morning,   R        04/24/21 1308   04/24/21 1309  Expectorated Sputum Assessment w Gram Stain, Rflx to Resp Cult  Once,   R        04/24/21 1308   04/24/21 1305  Respiratory (~20 pathogens) panel by  PCR  (COPD / Pneumonia / Cellulitis / Lower Extremity Wound)  Once,   R        04/24/21 1308   04/24/21 1227  Magnesium  Once,   R        04/24/21 1226   04/24/21 1227  Iron and TIBC  Once,   R        04/24/21 1226   04/24/21 1227  Ferritin  Once,   R        04/24/21 1226   04/24/21 1227  Vitamin B12  Once,   R        04/24/21 1226   04/24/21 1227  Folate, serum, performed at [facility] lab  Once,   R        04/24/21 1226            Vitals/Pain Today's Vitals   04/24/21 1300 04/24/21 1315 04/24/21 1330 04/24/21 1345  BP: (!) 152/95 133/79 140/72 (!) 148/77  Pulse: 91 85 70 70  Resp: (!) 21 (!) 21 (!) 21 20  Temp:      TempSrc:      SpO2: 96% 93% 96% 95%  Weight:      Height:      PainSc:        Isolation Precautions Droplet precaution  Medications Medications  cefTRIAXone (ROCEPHIN) 1 g in sodium chloride 0.9 % 100 mL IVPB (has no administration in time range)  sodium chloride flush (NS) 0.9 % injection 3 mL (has no administration in time range)  sodium chloride flush (NS) 0.9 % injection 3 mL (has no administration in time range)  0.9 %  sodium chloride infusion (has no administration in time range)  acetaminophen (TYLENOL) tablet 650 mg (has no administration in time range)    Or  acetaminophen (TYLENOL) suppository 650 mg (has no administration in time range)  ipratropium-albuterol (DUONEB) 0.5-2.5 (3) MG/3ML nebulizer solution 3 mL (has no administration in time range)  methylPREDNISolone sodium succinate (SOLU-MEDROL) 125 mg/2 mL injection 125 mg (has no administration in time range)  furosemide (LASIX) injection 20 mg (has no administration in time range)  atorvastatin (LIPITOR) tablet 80 mg (has no administration in time range)  carvedilol (COREG) tablet 3.125 mg (has no administration in time range)  sacubitril-valsartan (ENTRESTO) 97-103 mg per tablet (has no administration in time range)  pantoprazole (PROTONIX) EC tablet 80 mg (has no administration in  time range)  mometasone-formoterol (DULERA) 200-5 MCG/ACT inhaler 2 puff (has no administration in time range)  albuterol (PROVENTIL) (  2.5 MG/3ML) 0.083% nebulizer solution 2.5 mg (has no administration in time range)  furosemide (LASIX) injection 40 mg (40 mg Intravenous Given 04/24/21 0721)  ipratropium-albuterol (DUONEB) 0.5-2.5 (3) MG/3ML nebulizer solution 3 mL (3 mLs Nebulization Given 04/24/21 0738)  methylPREDNISolone sodium succinate (SOLU-MEDROL) 125 mg/2 mL injection 125 mg (125 mg Intravenous Given 04/24/21 4584)  aspirin chewable tablet 324 mg (324 mg Oral Given 04/24/21 1005)    Mobility walks Low fall risk   Focused Assessments Pulmonary Assessment Handoff:  Lung sounds: Bilateral Breath Sounds: Coarse crackles O2 Device: [place] O2 flow rate: 2LPM    R Recommendations: See Admitting Provider Note  Report given to:   Additional Notes:

## 2021-04-24 NOTE — Assessment & Plan Note (Addendum)
76 year old with combine diastolic and systolic CHF presenting with 1 day history of worsening shortness of breath, coughing, orthopnea and leg swelling, elevated BNP and CXR with findings of fluid overload found to be in acute on chronic combined CHF -admit to medical telemetry  -just started 40mg  lasix about 4 days ago, will continue with 40mg  IV lasix for now and watch I/O closely. Already feeling much better with this and copd treatment.  - repeat echo with elevated troponin. (last echo 11/22: EF improved to 40-45%, grade 1DD) -strict I/O and daily weights -check magnesium and follow electrolytes -continue entresto, coreg. Appears he was discharged on 12.5mg  aldactone in December, but he states he is not taking this.

## 2021-04-24 NOTE — Assessment & Plan Note (Addendum)
Troponin: 909>311>216 EDP discussed with cardiology, Dr. Harrington Challenger, who felt secondary to demand ischemia -continue to trend troponin -he is having no chest pain and no changes on ekg -telemetry

## 2021-04-24 NOTE — ED Notes (Signed)
Removed NRB from pt, applied  @ 5LPM.

## 2021-04-24 NOTE — Assessment & Plan Note (Signed)
Quit 5 weeks ago, declines patch

## 2021-04-24 NOTE — Assessment & Plan Note (Signed)
Resolving on head CT 03/22/21 Resolving anterior right temporal intraparenchymal hematoma. Small right convexity chronic subdural hematoma with unchanged 3 mm leftward midline shift. -SCDs for VTE prophylaxis

## 2021-04-24 NOTE — ED Provider Notes (Signed)
[facility] EMERGENCY DEPARTMENT Provider Note  CSN: [id number] Arrival date & time: 04/24/21 2831  Chief Complaint(s) Shortness of Breath  HPI [name] is a 76 y.o. male with a past medical history listed below including COPD not on supplemental oxygen, CHF with a last EF of 40 to 45% on echo from November 2022 (on my review of records) on spironolactone who was reportedly started on Lasix couple days ago for CHF exacerbation presents for several days of gradually worsening shortness of breath.  Endorses chronic cough, but slightly worse and productive.  No fevers or chills.  No known sick contacts.  Reports that since being on Lasix, edema from upper extremities has improved.  Still has lower extremity edema.  Has been compliant with his medications.   Denies any associated chest pain. No other physical complaints at this time.  Patient brought in by EMS who noted patient had bibasilar Rales.  Noted to be hypoxic with sats in the 80s on room air. Patient also noted to be hypertensive.  He was given duo nebs and nitroglycerin, which improved his blood pressure and shortness of breath.  The history is provided by the patient.   Past Medical History Past Medical History:  Diagnosis Date   AAA (abdominal aortic aneurysm)    a.  CTA 7/19: measured 4.5 x 5.3 cm and greatest transverse dimensions   Abnormal nuclear stress test    a.  Myoview 2012: anterior wall ischemia with an estimated EF of 26%.  This was a new wall motion abnormality as well as a newly reduced EF   COPD (chronic obstructive pulmonary disease) (HCC)    Coronary artery disease    a.  Posterior MI in 2002 status post BMS to the LCx; b. [place] 2012: 95% stenosis of the proximal LAD, 95% stenosis of the mid LAD, 30% in-stent restenosis of the mid left circumflex with a second lesion of diffuse 50% stenosis.  The patient underwent successful PCI/BMS to the mid LAD with 0% residual stenosis, LV gram not  performed   GERD (gastroesophageal reflux disease)    History of kidney stones    History of prostate cancer    Hyperlipidemia    Hypertension    Myocardial infarction [facility])    Neuromuscular disorder ([place])    Prostate cancer ([place])    a.  Status post seeding   PVD (peripheral vascular disease) ([place])    Stroke [facility])    Patient Active Problem List   Diagnosis Date Noted   Incarcerated inguinal hernia 03/18/2021   Orthostatic hypotension    Chronic combined systolic and diastolic congestive heart failure (HCC)    Vascular headache    Pressure injury of skin 03/01/2021   Intraparenchymal hemorrhage of brain ([place]) 02/26/2021   SDH (subdural hematoma) 02/21/2021   Syncope 02/21/2021   Fall 02/21/2021   Hypertensive urgency 02/21/2021   HTN (hypertension) 02/21/2021   Stroke ([place]) 02/21/2021   Chronic combined systolic and diastolic CHF (congestive heart failure) ([place]) 02/21/2021   Leukocytosis 02/21/2021   Elevated troponin 02/21/2021   Zygomatic arch fracture ([place]) 02/21/2021   UTI (urinary tract infection) 02/21/2021   Sepsis ([place]) 02/21/2021   Other spondylosis with radiculopathy, lumbar region 09/04/2018   Centrilobular emphysema ([place]) 07/19/2018   Dilated cardiomyopathy ([place]) 07/19/2018   Coronary artery disease of native artery of native heart with stable angina pectoris ([place]) 01/20/2018   AAA (abdominal aortic aneurysm) without rupture 10/30/2017   Personal history of tobacco use, presenting hazards  to health 08/29/2016   COPD exacerbation ([place]) 04/03/2015   Medicare annual wellness visit, subsequent 05/23/2014   Barrett's esophagus 05/20/2014   Constipation 05/20/2014   Arthritis of hand 05/07/2014   COPD (chronic obstructive pulmonary disease) ([place]) 11/06/2011   Pulmonary nodule 11/06/2011   Tobacco abuse 02/12/2011   Tobacco abuse counseling 02/12/2011   CAD, NATIVE VESSEL 10/18/2008   COLONIC POLYPS 08/05/2008   Hyperlipidemia 08/05/2008   Essential hypertension  08/05/2008   GERD 08/05/2008   Home Medication(s) Prior to Admission medications   Medication Sig Start Date End Date Taking? Authorizing Provider  acetaminophen (TYLENOL) 500 MG tablet Take 2 tablets (1,000 mg total) by mouth every 6 (six) hours as needed. 03/23/21   Saverio Danker, PA-C  albuterol (VENTOLIN HFA) 108 (90 Base) MCG/ACT inhaler Inhale 2 puffs into the lungs every 6 (six) hours as needed for wheezing. 04/07/21 11/30/24  Medina-Vargas, Monina C, NP  atorvastatin (LIPITOR) 80 MG tablet Take 1 tablet (80 mg total) by mouth daily. 04/07/21   Medina-Vargas, Monina C, NP  bisacodyl (DULCOLAX) 10 MG suppository If not relieved by MOM, give 10 mg Bisacodyl suppositiory rectally X 1 dose in 24 hours as needed    [provider]  budesonide-formoterol (SYMBICORT) 160-4.5 MCG/ACT inhaler Inhale 2 puffs into the lungs 2 (two) times daily. 04/07/21   Medina-Vargas, Monina C, NP  carvedilol (COREG) 3.125 MG tablet Take 1 tablet (3.125 mg total) by mouth 2 (two) times daily. 04/07/21   Medina-Vargas, Monina C, NP  magnesium hydroxide (MILK OF MAGNESIA) 400 MG/5ML suspension If no BM in 3 days, give 30 cc Milk of Magnesium p.o. x 1 dose in 24 hours as needed    [provider]  Multiple Vitamins-Minerals (MULTIVITAMIN WITH MINERALS) tablet Take 1 tablet by mouth daily.    [provider]  nicotine (NICODERM CQ - DOSED IN MG/24 HOURS) 21 mg/24hr patch Place 1 patch (21 mg total) onto the skin daily. 03/17/21   Love, Ivan Anchors, PA-C  NON FORMULARY Diet:NAS diet    [provider]  omeprazole (PRILOSEC) 40 MG capsule TAKE 1 CAPSULE BY MOUTH DAILY USUALLY 30MINUTES BEFORE BREAKFAST 04/07/21   Medina-Vargas, Monina C, NP  oxyCODONE (OXY IR/ROXICODONE) 5 MG immediate release tablet Take 1 tablet (5 mg total) by mouth every 6 (six) hours as needed for moderate pain. 04/07/21   Medina-Vargas, Monina C, NP  polyethylene glycol powder (GLYCOLAX/MIRALAX) powder Take 17 g by  mouth daily as needed for mild constipation. 04/24/11   [provider]  sacubitril-valsartan (ENTRESTO) 97-103 MG Take 1 tablet by mouth 2 (two) times daily. 04/07/21   Medina-Vargas, Monina C, NP  senna-docusate (SENOKOT-S) 8.6-50 MG tablet Take 2 tablets by mouth at bedtime. 03/16/21   Love, Ivan Anchors, PA-C  Sodium Phosphates (RA SALINE ENEMA RE) If not relieved by Biscodyl suppository, give disposable Saline Enema rectally X 1 dose/24 hrs as needed. Contact MD as needed if no results from enema    [provider]  spironolactone (ALDACTONE) 25 MG tablet Take 1 tablet (25 mg total) by mouth daily. FOR CHRONIC COMBINED CHF 04/07/21   Medina-Vargas, Monina C, NP  valproic acid (DEPAKENE) 250 MG capsule Take 1 capsule (250 mg total) by mouth 2 (two) times daily. 04/07/21   Medina-Vargas, Senaida Lange, NP  Allergies Lyrica [pregabalin] and Prednisone  Review of Systems Review of Systems As noted in HPI  Physical Exam Vital Signs  I have reviewed the triage vital signs BP 138/77    Pulse 87    Temp 97.8 F (36.6 C) (Temporal)    Resp 14    Ht 6' (1.829 m)    Wt 75.7 kg    SpO2 98%    BMI 22.63 kg/m   Physical Exam Vitals reviewed.  Constitutional:      General: He is not in acute distress.    Appearance: He is well-developed. He is not diaphoretic.  HENT:     Head: Normocephalic and atraumatic.     Nose: Nose normal.  Eyes:     General: No scleral icterus.       Right eye: No discharge.        Left eye: No discharge.     Conjunctiva/sclera: Conjunctivae normal.     Pupils: Pupils are equal, round, and reactive to light.  Cardiovascular:     Rate and Rhythm: Normal rate and regular rhythm.     Heart sounds: No murmur heard.   No friction rub. No gallop.  Pulmonary:     Effort: Tachypnea and prolonged expiration present.     Breath sounds:  No stridor. Examination of the right-middle field reveals rhonchi. Examination of the right-lower field reveals rhonchi and rales. Examination of the left-lower field reveals rhonchi and rales. Rhonchi and rales present.  Abdominal:     General: There is no distension.     Palpations: Abdomen is soft.     Tenderness: There is no abdominal tenderness.  Musculoskeletal:        General: No tenderness.     Cervical back: Normal range of motion and neck supple.     Right lower leg: 2+ Pitting Edema present.     Left lower leg: 2+ Pitting Edema present.  Skin:    General: Skin is warm and dry.     Findings: No erythema or rash.  Neurological:     Mental Status: He is alert and oriented to person, place, and time.    ED Results and Treatments Labs (all labs ordered are listed, but only abnormal results are displayed) Labs Reviewed  CBC WITH DIFFERENTIAL/PLATELET - Abnormal; Notable for the following components:      Result Value   RBC 3.47 (*)    Hemoglobin 10.9 (*)    HCT 35.3 (*)    MCV 101.7 (*)    Platelets 138 (*)    All other components within normal limits  I-STAT CHEM 8, ED - Abnormal; Notable for the following components:   Glucose, Bld 118 (*)    Calcium, Ion 1.12 (*)    TCO2 33 (*)    Hemoglobin 11.6 (*)    HCT 34.0 (*)    All other components within normal limits  I-STAT VENOUS BLOOD GAS, ED - Abnormal; Notable for the following components:   Bicarbonate 36.1 (*)    TCO2 38 (*)    Acid-Base Excess 10.0 (*)    Calcium, Ion 1.09 (*)    HCT 31.0 (*)    Hemoglobin 10.5 (*)    All other components within normal limits  RESP PANEL BY RT-PCR (FLU A&B, COVID) ARPGX2  COMPREHENSIVE METABOLIC PANEL  BRAIN NATRIURETIC PEPTIDE  TROPONIN I (HIGH SENSITIVITY)  TROPONIN I (HIGH SENSITIVITY)  EKG  EKG Interpretation  Date/Time:  Sunday April 24 2021  05:56:17 EST Ventricular Rate:  81 PR Interval:  191 QRS Duration: 96 QT Interval:  415 QTC Calculation: 482 R Axis:   9 Text Interpretation: Sinus rhythm Paired ventricular premature complexes Nonspecific T abnormalities, lateral leads Borderline prolonged QT interval Confirmed by Addison Lank [phone]) on 04/24/2021 6:19:21 AM       Radiology DG Chest Port 1 View  Result Date: 04/24/2021 CLINICAL DATA:  Shortness of breath EXAM: PORTABLE CHEST 1 VIEW COMPARISON:  Prior chest x-ray 03/18/2021 FINDINGS: Cardiomegaly. Increased pulmonary vascular congestion with perihilar and bibasilar airspace infiltrates. No large effusion. No pneumothorax. Atherosclerotic calcifications present in the transverse aorta. Questionable 7-8 mm nodule in the right upper lobe. IMPRESSION: 1. Increased pulmonary vascular congestion with bilateral perihilar and bibasilar interstitial and airspace opacities. Overall, the imaging appearance is most suggestive of CHF. However, given relatively dependent distribution aspiration is a consideration in the appropriate clinical context. 2. Questionable 7-8 mm right upper lobe pulmonary nodule not evident on prior imaging. Consider follow-up with noncontrast enhanced CT scan of the chest following resolution of the patient's acute symptoms. Electronically Signed   By: Jacqulynn Cadet M.D.   On: 04/24/2021 06:43    Pertinent labs & imaging results that were available during my care of the patient were reviewed by me and considered in my medical decision making (see MDM for details).  Medications Ordered in ED Medications  methylPREDNISolone sodium succinate (SOLU-MEDROL) 125 mg/2 mL injection 125 mg (has no administration in time range)  furosemide (LASIX) injection 40 mg (40 mg Intravenous Given 04/24/21 0721)  ipratropium-albuterol (DUONEB) 0.5-2.5 (3) MG/3ML nebulizer solution 3 mL (3 mLs Nebulization Given 04/24/21 0738)                                                                                                                                      Procedures .1-3 Lead EKG Interpretation Performed by: Fatima Blank, MD Authorized by: Fatima Blank, MD     Interpretation: abnormal     ECG rate:  88   ECG rate assessment: normal     Rhythm: sinus rhythm     Ectopy: PAC     Conduction: normal   .Critical Care Performed by: Fatima Blank, MD Authorized by: Fatima Blank, MD   Critical care provider statement:    Critical care time (minutes):  30   Critical care was necessary to treat or prevent imminent or life-threatening deterioration of the following conditions:  Respiratory failure and cardiac failure   Critical care was time spent personally by me on the following activities:  Development of treatment plan with patient or surrogate, discussions with consultants, evaluation of patient's response to treatment, examination of patient, ordering and review of laboratory studies, ordering and review of radiographic studies, ordering and performing treatments and interventions, pulse oximetry, re-evaluation of patient's condition and review  of old charts  (including critical care time)  Medical Decision Making / ED Course        2 days of gradually worsening shortness of breath Evidence of volume overload on exam with history of CHF. last EF of 40 to 45% on echo from November 2022 (on my review of records) Patient also with a history of COPD. Concerning for both of the above.  We will also assess for infectious process. Work-up ordered to assess concerns above.  Labs and imaging independently interpreted by me and noted below: CBC without leukocytosis.  Relatively stable hemoglobin. I-STAT Chem-8 without evidence of renal sufficiency.  Formal metabolic panel still pending. VBG without respiratory acidosis or hypercarbia. COVID/influenza still pending BNP and troponin still pending Chest x-ray consistent with CHF  exacerbation Management: CHF exacerbation Patient currently on supplemental oxygen on nonrebreather.  Satting well with no increased work of breathing or respiratory distress. Will give IV Lasix. COPD exacerbation No respiratory distress on nonrebreather. Will give additional DuoNeb and Solu-Medrol  Patient will likely need admission to the hospital following results from rest of the labs above.  Patient care turned over to oncoming provider. Patient case and results discussed in detail; please see their note for further ED managment.     Final Clinical Impression(s) / ED Diagnoses Final diagnoses:  SOB (shortness of breath)           This chart was dictated using voice recognition software.  Despite best efforts to proofread,  errors can occur which can change the documentation meaning.    Fatima Blank, MD 04/24/21 [phone]

## 2021-04-24 NOTE — Assessment & Plan Note (Addendum)
Possible new nodule in RUL, consider CT chest.  Last CT in 2021: Small bilateral pulmonary nodules, including a dominant 6.2 mm triangular subpleural nodule in the anterior right middle lobe, unchanged. Follows regular screening outpatient

## 2021-04-24 NOTE — Assessment & Plan Note (Addendum)
-  he has had increased wheezing, tightness, dyspnea, cough and increased purulent sputum. Likely has combination copd exacerbation in setting of chf exacerbation.  -acute on chronic respiratory failure -has improved with IV steroids/nebulizer and IV lasix.  -continue IV solumedrol (allergy to prednisone) 80mg  BID -Nebulizers: scheduled Duoneb and prn albuterol rocephin for moderate to severe copd exacerbation  -check respiratory panel -sputum culture  -Continue symbicort

## 2021-04-24 NOTE — Assessment & Plan Note (Signed)
Continue lipitor 80mg  Continue coreg

## 2021-04-24 NOTE — H&P (Signed)
History and Physical    [name] HQI:[id number] DOB: 10/31/1945 DOA: 04/24/2021  PCP: Rusty Aus, MD Consultants:  cardiology: Dr. Rockey Situ  Patient coming from:  Home - lives with his wife and 2 sons.   Chief Complaint: shortness of breath  HPI: [name] is a 76 y.o. male with medical history significant of combined systolic and diastolic CHF ( ef 13-24%, mild to moderately decreased LVF, global hypokinesis, grade I DD), COPD, chronic respiratory failure on 2L [place], CAD, HLD, HTN, GERD, tobacco abuse, AAA, pulmonary nodule presenting to ED with shortness of breath. He states symptoms started about 1 day ago. He was laying in bed on his 2L oxygen and all of a sudden couldn't catch his breath. He tried a breathing treatment, but this didn't help so he called 911. He has had worsening swelling in his legs over the past few weeks. Denies any weight gain. + orthopnea. He also states his cough is worse than normal and he's heard his wheezing. His cough is productive with increased sputum production.   He denies any fever/chills, headache/dizziness, chest pain or palpitations, stomach pain, N/V/D, dysuria or rashes.   Stopped smoking about 5 weeks ago. He does not drink.   ED Course: vitals: afebrile, bp: 148/74, HR: 78, RR: 21, oxygen: 99% on NRB mask Pertinent labs: bnp: 3545.6, hgb: 10.9 (12.3-12.8), troponin 372>448, covid/flu: negative,  CXR: increased pulmonary vascular congestion with bilateral perihilar and bibasilar interstitial and airspace opacities. ? 7-47mm RUL pulmonary nodule not evident on prior imaging.  In ED: cardiology called regarding elevated troponin and felt to be due to demand ischemia, given ASA, 40mg  lasix, duoneb and 125mg  solumedrol. TRH was asked to admit.   Review of Systems: As per HPI; otherwise review of systems reviewed and negative.   Ambulatory Status:  Ambulates with walker    Past Medical History:  Diagnosis Date   AAA (abdominal aortic aneurysm)     a.  CTA 7/19: measured 4.5 x 5.3 cm and greatest transverse dimensions   Abnormal nuclear stress test    a.  Myoview 2012: anterior wall ischemia with an estimated EF of 26%.  This was a new wall motion abnormality as well as a newly reduced EF   COPD (chronic obstructive pulmonary disease) (HCC)    Coronary artery disease    a.  Posterior MI in 2002 status post BMS to the LCx; b. [place] 2012: 95% stenosis of the proximal LAD, 95% stenosis of the mid LAD, 30% in-stent restenosis of the mid left circumflex with a second lesion of diffuse 50% stenosis.  The patient underwent successful PCI/BMS to the mid LAD with 0% residual stenosis, LV gram not performed   GERD (gastroesophageal reflux disease)    History of kidney stones    History of prostate cancer    Hyperlipidemia    Hypertension    Myocardial infarction [facility])    Neuromuscular disorder ([place])    Prostate cancer ([place])    a.  Status post seeding   PVD (peripheral vascular disease) ([place])    Stroke [facility])     Past Surgical History:  Procedure Laterality Date   BACK SURGERY  2009/2010   x 2   BOWEL RESECTION N/A 03/18/2021   Procedure: SMALL BOWEL RESECTION;  Surgeon: Jesusita Oka, MD;  Location: [place];  Service: General;  Laterality: N/A;   CARDIAC CATHETERIZATION  2002   stent placement [place]   ENDOVASCULAR REPAIR/STENT GRAFT N/A 01/02/2018   Procedure:  ENDOVASCULAR REPAIR/STENT GRAFT;  Surgeon: Algernon Huxley, MD;  Location: [place] [facility];  Service: Cardiovascular;  Laterality: N/A;   FOOT SURGERY     bilateral    HERNIA REPAIR     bilateral    INGUINAL HERNIA REPAIR Left 03/18/2021   Procedure: INGUINAL HERNIA REPAIR;  Surgeon: Jesusita Oka, MD;  Location: [place];  Service: General;  Laterality: Left;   INSERTION OF MESH Left 03/18/2021   Procedure: INSERTION OF MESH;  Surgeon: Jesusita Oka, MD;  Location: [place];  Service: General;  Laterality: Left;   KNEE SURGERY     right knee    PROSTATE SURGERY  09/2009    prostate implant   SPINE SURGERY     UMBILICAL HERNIA REPAIR N/A 03/18/2021   Procedure: UMBILICAL HERNIA REPAIR;  Surgeon: Jesusita Oka, MD;  Location: [facility];  Service: General;  Laterality: N/A;    Social History   Socioeconomic History   Marital status: Married    Spouse name: Not on file   Number of children: Not on file   Years of education: Not on file   Highest education level: Not on file  Occupational History   Not on file  Tobacco Use   Smoking status: Every Day    Packs/day: 0.25    Years: 56.00    Pack years: 14.00    Types: Cigarettes   Smokeless tobacco: Never   Tobacco comments:    back to smoking X1 year  Vaping Use   Vaping Use: Never used  Substance and Sexual Activity   Alcohol use: No    Alcohol/week: 0.0 standard drinks   Drug use: No   Sexual activity: Not on file  Other Topics Concern   Not on file  Social History Narrative   Not on file   Social Determinants of Health   Financial Resource Strain: Not on file  Food Insecurity: Not on file  Transportation Needs: Not on file  Physical Activity: Not on file  Stress: Not on file  Social Connections: Not on file  Intimate Partner Violence: Not on file    Allergies  Allergen Reactions   Lyrica [Pregabalin] Swelling        Prednisone Other (See Comments)    Feels like having a heart attack. Has had the injection form before and does well. Oral Steroids    Family History  Problem Relation Age of Onset   Heart attack Mother    Diabetes Mother    Heart disease Father    Lung cancer Daughter     Prior to Admission medications   Medication Sig Start Date End Date Taking? Authorizing Provider  acetaminophen (TYLENOL) 500 MG tablet Take 2 tablets (1,000 mg total) by mouth every 6 (six) hours as needed. Patient taking differently: Take 1,000 mg by mouth every 6 (six) hours as needed for headache or moderate pain. 03/23/21  Yes Saverio Danker, PA-C  albuterol (VENTOLIN HFA) 108 (90  Base) MCG/ACT inhaler Inhale 2 puffs into the lungs every 6 (six) hours as needed for wheezing. 04/07/21 11/30/24 Yes Medina-Vargas, Monina C, NP  atorvastatin (LIPITOR) 80 MG tablet Take 1 tablet (80 mg total) by mouth daily. 04/07/21  Yes Medina-Vargas, Monina C, NP  budesonide-formoterol (SYMBICORT) 160-4.5 MCG/ACT inhaler Inhale 2 puffs into the lungs 2 (two) times daily. 04/07/21  Yes Medina-Vargas, Monina C, NP  carvedilol (COREG) 3.125 MG tablet Take 1 tablet (3.125 mg total) by mouth 2 (two) times daily. 04/07/21  Yes  Medina-Vargas, Monina C, NP  furosemide (LASIX) 40 MG tablet Take 40 mg by mouth daily. 04/20/21 04/20/22 Yes [provider]  Multiple Vitamins-Minerals (MULTIVITAMIN WITH MINERALS) tablet Take 1 tablet by mouth daily.   Yes [provider]  omeprazole (PRILOSEC) 40 MG capsule TAKE 1 CAPSULE BY MOUTH DAILY USUALLY 30MINUTES BEFORE BREAKFAST Patient taking differently: Take 40 mg by mouth daily. 30MINUTES BEFORE BREAKFAST 04/07/21  Yes Medina-Vargas, Monina C, NP  sacubitril-valsartan (ENTRESTO) 97-103 MG Take 1 tablet by mouth 2 (two) times daily. 04/07/21  Yes Medina-Vargas, Monina C, NP  nicotine (NICODERM CQ - DOSED IN MG/24 HOURS) 21 mg/24hr patch Place 1 patch (21 mg total) onto the skin daily. Patient not taking: Reported on 04/24/2021 03/17/21   Bary Leriche, PA-C  oxyCODONE (OXY IR/ROXICODONE) 5 MG immediate release tablet Take 1 tablet (5 mg total) by mouth every 6 (six) hours as needed for moderate pain. Patient not taking: Reported on 04/24/2021 04/07/21   Medina-Vargas, Monina C, NP  senna-docusate (SENOKOT-S) 8.6-50 MG tablet Take 2 tablets by mouth at bedtime. Patient not taking: Reported on 04/24/2021 03/16/21   Love, Ivan Anchors, PA-C  spironolactone (ALDACTONE) 25 MG tablet Take 1 tablet (25 mg total) by mouth daily. FOR CHRONIC COMBINED CHF Patient not taking: Reported on 04/24/2021 04/07/21   Medina-Vargas, Monina C, NP  valproic acid (DEPAKENE) 250 MG  capsule Take 1 capsule (250 mg total) by mouth 2 (two) times daily. Patient not taking: Reported on 04/24/2021 04/07/21   Nickola Major, NP    Physical Exam: Vitals:   04/24/21 1315 04/24/21 1330 04/24/21 1345 04/24/21 1526  BP: 133/79 140/72 (!) 148/77 (!) 161/88  Pulse: 85 70 70 75  Resp: (!) 21 (!) 21 20 17   Temp:    97.7 F (36.5 C)  TempSrc:    Oral  SpO2: 93% 96% 95% 100%  Weight:    74.2 kg  Height:    6' (1.829 m)     General:  Appears calm and comfortable and is in NAD Eyes:  PERRL, EOMI, normal lids, iris ENT:  grossly normal hearing, lips & tongue, mmm; appropriate dentition Neck:  no LAD, masses or thyromegaly; no carotid bruits Cardiovascular:  RRR, no m/r/g.3+ pitting edema to mid lower leg  Respiratory:   decreased bibasilar breath sounds. No wheezing/rales/rhonchi. Normal respiratory effort. Abdomen:  soft, NT, ND, NABS Back:   normal alignment, no CVAT Skin:  no rash or induration seen on limited exam Musculoskeletal:  grossly normal tone BUE/BLE, good ROM, no bony abnormality Lower extremity:    Limited foot exam with no ulcerations.  Thready-poor distal pulses. Psychiatric:  grossly normal mood and affect, speech fluent and appropriate, AOx3 Neurologic:  CN 2-12 grossly intact, moves all extremities in coordinated fashion, sensation intact   Radiological Exams on Admission: Independently reviewed - see discussion in A/P where applicable  DG Chest Port 1 View  Result Date: 04/24/2021 CLINICAL DATA:  Shortness of breath EXAM: PORTABLE CHEST 1 VIEW COMPARISON:  Prior chest x-ray 03/18/2021 FINDINGS: Cardiomegaly. Increased pulmonary vascular congestion with perihilar and bibasilar airspace infiltrates. No large effusion. No pneumothorax. Atherosclerotic calcifications present in the transverse aorta. Questionable 7-8 mm nodule in the right upper lobe. IMPRESSION: 1. Increased pulmonary vascular congestion with bilateral perihilar and bibasilar  interstitial and airspace opacities. Overall, the imaging appearance is most suggestive of CHF. However, given relatively dependent distribution aspiration is a consideration in the appropriate clinical context. 2. Questionable 7-8 mm right upper lobe pulmonary nodule  not evident on prior imaging. Consider follow-up with noncontrast enhanced CT scan of the chest following resolution of the patient's acute symptoms. Electronically Signed   By: Jacqulynn Cadet M.D.   On: 04/24/2021 06:43    EKG: Independently reviewed.  NSR with rate 81; nonspecific ST changes with no evidence of acute ischemia. PVC, borderline prolonged qt  Repeat ekg, nsr. No qt prolongation.   Labs on Admission: I have personally reviewed the available labs and imaging studies at the time of the admission.  Pertinent labs:  bnp: 3545.6,  hgb: 10.9 (12.3-12.8),  troponin 372>448,  covid/flu: negative,   Assessment/Plan * Acute on chronic combined systolic and diastolic CHF (congestive heart failure) ([place])- (present on admission) 76 year old with combine diastolic and systolic CHF presenting with 1 day history of worsening shortness of breath, coughing, orthopnea and leg swelling, elevated BNP and CXR with findings of fluid overload found to be in acute on chronic combined CHF -admit to medical telemetry  -just started 40mg  lasix about 4 days ago, will continue with 40mg  IV lasix for now and watch I/O closely. Already feeling much better with this and copd treatment.  - repeat echo with elevated troponin. (last echo 11/22: EF improved to 40-45%, grade 1DD) -strict I/O and daily weights -check magnesium and follow electrolytes -continue entresto, coreg. Appears he was discharged on 12.5mg  aldactone in December, but he states he is not taking this.    COPD exacerbation ([place])- (present on admission) -he has had increased wheezing, tightness, dyspnea, cough and increased purulent sputum. Likely has combination copd  exacerbation in setting of chf exacerbation.  -acute on chronic respiratory failure -has improved with IV steroids/nebulizer and IV lasix.  -continue IV solumedrol (allergy to prednisone) 80mg  BID -Nebulizers: scheduled Duoneb and prn albuterol rocephin for moderate to severe copd exacerbation  -check respiratory panel -sputum culture  -Continue symbicort   Acute on chronic respiratory failure with hypoxia (HCC)- (present on admission) -typically on 2L [place]; however, was requiring 10L on NRB mask -after steroids, duonebs and IV diuresis he is back to 2-3 L [place]; however, requires more with ambulation -continue to treat CHF exacerbation/copd exacerbation and will likely need ambulatory test.   Elevated troponin- (present on admission) Troponin: 308>657>846 EDP discussed with cardiology, Dr. Harrington Challenger, who felt secondary to demand ischemia -continue to trend troponin -he is having no chest pain and no changes on ekg -telemetry   Essential hypertension- (present on admission) Continue entresto, coreg  CAD, NATIVE VESSEL/HLD- (present on admission) Continue lipitor 80mg  Continue coreg   Anemia- (present on admission) Below baseline, have not seen iron studies Iron studies pending   GERD- (present on admission) Continue PPI   Pulmonary nodule- (present on admission) Possible new nodule in RUL, consider CT chest.  Last CT in 2021: Small bilateral pulmonary nodules, including a dominant 6.2 mm triangular subpleural nodule in the anterior right middle lobe, unchanged. Follows regular screening outpatient     Tobacco abuse- (present on admission) Quit 5 weeks ago, declines patch   Hyperlipidemia- (present on admission) Continue lipitor 80mg    AAA (abdominal aortic aneurysm) without rupture- (present on admission)  3.8 cm -s/p aortobi-iliac stent graft, patent -Severe atherosclerotic disease on CT  Intraparenchymal hemorrhage of brain ([place])- (present on admission) Resolving on  head CT 03/22/21 Resolving anterior right temporal intraparenchymal hematoma. Small right convexity chronic subdural hematoma with unchanged 3 mm leftward midline shift. -SCDs for VTE prophylaxis    Body mass index is 22.19 kg/m.  Level of care: Telemetry Medical DVT prophylaxis:  SCDs Code Status:  DNR Family Communication: None present Disposition Plan:  The patient is from: home  Anticipated d/c is to: home   Requires inpatient hospitalization for acute on chronic respiratory failure, CHF exacerbation and copd exacerbation requiring IV medication, close monitoring and assessment and more testing and is at increased risk of worsening.    Patient is currently: stable  Consults called: none   Admission status:  inpatient    Orma Flaming MD [facility]   How to contact the [facility] Attending or Consulting provider [place] or covering provider during after hours [place], for this patient?  Check the care team in [facility] and look for a) attending/consulting [facility] provider listed and b) the [facility] team listed Log into [email or web address] and use [place] universal password to access. If you do not have the password, please contact the hospital operator. Locate the [facility] provider you are looking for under [facility] and page to a number that you can be directly reached. If you still have difficulty reaching the provider, please page the [facility] (Director on Call) for the Hospitalists listed on amion for assistance.   04/24/2021, 4:49 PM

## 2021-04-24 NOTE — Progress Notes (Signed)
Nurse paged at 5:58 that patient had 11 beat VT run. Asymptomatic, vitals stable.  Magnesium 1.5, gave oral 400mg , but will give 2g IV now. Potassium normal.  Continue to monitor.   Dr. Rogers Blocker [facility]

## 2021-04-24 NOTE — Assessment & Plan Note (Signed)
--  Continue lipitor 80mg  ?

## 2021-04-25 ENCOUNTER — Inpatient Hospital Stay (HOSPITAL_COMMUNITY): Payer: PPO

## 2021-04-25 DIAGNOSIS — R778 Other specified abnormalities of plasma proteins: Secondary | ICD-10-CM | POA: Diagnosis not present

## 2021-04-25 DIAGNOSIS — I5043 Acute on chronic combined systolic (congestive) and diastolic (congestive) heart failure: Secondary | ICD-10-CM

## 2021-04-25 LAB — BASIC METABOLIC PANEL
Anion gap: 6 (ref 5–15)
BUN: 16 mg/dL (ref 8–23)
CO2: 34 mmol/L — ABNORMAL HIGH (ref 22–32)
Calcium: 8.3 mg/dL — ABNORMAL LOW (ref 8.9–10.3)
Chloride: 99 mmol/L (ref 98–111)
Creatinine, Ser: 0.87 mg/dL (ref 0.61–1.24)
GFR, Estimated: >60 mL/min (ref >60)
Glucose, Bld: 114 mg/dL — ABNORMAL HIGH (ref 70–99)
Potassium: 4 mmol/L (ref 3.5–5.1)
Sodium: 139 mmol/L (ref 135–145)

## 2021-04-25 LAB — ECHOCARDIOGRAM COMPLETE
AR max vel: 2.71 cm2
AV Area VTI: 2.64 cm2
AV Area mean vel: 2.51 cm2
AV Mean grad: 5 mmHg
AV Peak grad: 9.4 mmHg
Ao pk vel: 1.53 m/s
Area-P 1/2: 3.74 cm2
Calc EF: 29.4 %
Height: 72 in
MV VTI: 0.51 cm2
S' Lateral: 3.9 cm
Single Plane A2C EF: 30.5 %
Single Plane A4C EF: 31.5 %
Weight: 2662.4 oz

## 2021-04-25 LAB — CBC
HCT: 34.2 % — ABNORMAL LOW (ref 39.0–52.0)
Hemoglobin: 11.2 g/dL — ABNORMAL LOW (ref 13.0–17.0)
MCH: 32.1 pg (ref 26.0–34.0)
MCHC: 32.7 g/dL (ref 30.0–36.0)
MCV: 98 fL (ref 80.0–100.0)
Platelets: 159 10*3/uL (ref 150–400)
RBC: 3.49 MIL/uL — ABNORMAL LOW (ref 4.22–5.81)
RDW: 14.6 % (ref 11.5–15.5)
WBC: 5.9 10*3/uL (ref 4.0–10.5)
nRBC: 0 % (ref 0.0–0.2)

## 2021-04-25 LAB — MAGNESIUM: Magnesium: 1.9 mg/dL (ref 1.7–2.4)

## 2021-04-25 MED ORDER — IPRATROPIUM-ALBUTEROL 0.5-2.5 (3) MG/3ML IN SOLN: 3.0000 mL | Freq: Four times a day (QID) | RESPIRATORY_TRACT | Status: DC

## 2021-04-25 MED ORDER — FUROSEMIDE 10 MG/ML IJ SOLN
40.0000 mg | Freq: Two times a day (BID) | INTRAMUSCULAR | Status: DC
  Administered 2021-04-25 – 2021-04-27 (×5): 40 mg via INTRAVENOUS
  Filled 2021-04-25 (×5): qty 4

## 2021-04-25 NOTE — Progress Notes (Signed)
° °  Echocardiogram 2D Echocardiogram has been performed.  [name] 04/25/2021, 8:41 AM

## 2021-04-25 NOTE — Telephone Encounter (Signed)
Faxed pt's 2022 SSN income for pt and wife Updated household size  Completed application placed back in file cabinet.  Received "successful" fax confirmation

## 2021-04-25 NOTE — Progress Notes (Addendum)
PROGRESS NOTE    [name]  YTK:[id number] DOB: 10/02/1945 DOA: 04/24/2021 PCP: Rusty Aus, MD  Brief Narrative: 76/M with history of chronic combined systolic and diastolic CHF, EF 55-73%, COPD/chronic respiratory failure on 2 L home O2, CAD, hypertension, dyslipidemia, tobacco abuse, AAA, pulmonary nodule presented to the ED with acute onset dyspnea, patient also noticed worsening lower extremity edema in the past few weeks, and orthopnea. -Also reported some productive cough, no fevers or chills -In the ED he was afebrile, O2 sats were 99% on nonrebreather mask, BNP was 3545, troponin was 372, COVID and flu PCR were negative, chest x-ray noted pulmonary vascular congestion with bilateral perihilar interstitial and airspace opacities, pulmonary nodule. -He was given Lasix, duo nebs, Solu-Medrol in ED and admitted   Assessment & Plan:   Acute on chronic systolic, diastolic CHF -previous echo w/ EF of 30-35% -Clinically appears volume overloaded -Continue IV Lasix today, increased dose to 40 Mg every 12 -Currently on Entresto, continue carvedilol -Monitor I's/O, daily weights closely  Elevated troponin -With flat trend, clinically suspect demand ischemia -Patient denies any chest pain, no acute findings on EKG -Cardiology was consulted yesterday, demand ischemia suspected, recommended to trend troponin -Will check 2D echocardiogram  Bronchitis, COPD exacerbation -No wheezing at this time, continue duo nebs will discontinue steroids -Continue ceftriaxone x5days  History of CAD -See discussion above, continue aspirin, Coreg, Lipitor  Chronic anemia -Follow-up anemia panel  History of pulmonary nodule -Needs follow-up   Tobacco abuse -Quit 5 weeks ago, declines nicotine patch  History of AAA -Status post aorto bi-iliac stent graft  History of intraparenchymal hemorrhage  DVT prophylaxis: SCDs Code Status: DNR Family Communication: Discussed patient in detail, no  family at bedside Disposition Plan: Home pending clinical improvement Status is: Inpatient Inpatient.  Appropriate due to severity of illness  Consultants:  None, discussed with cardiology yesterday  Procedures:   Antimicrobials:    Subjective: -Breathing improving, denies chest pain, some productive cough, no wheezing  Objective: Vitals:   04/25/21 0525 04/25/21 0754 04/25/21 0800 04/25/21 1119  BP: (!) 155/69 (!) 155/77 (!) 158/85 (!) 145/80  Pulse: 70 75 (!) 105 (!) 52  Resp: 20  20 (!) 22  Temp: 97.7 F (36.5 C)   97.7 F (36.5 C)  TempSrc: Oral   Oral  SpO2: 94% 98% 91% 97%  Weight: 75.5 kg     Height:        Intake/Output Summary (Last 24 hours) at 04/25/2021 1125 Last data filed at 04/25/2021 1100 Gross per 24 hour  Intake 413.58 ml  Output 1285 ml  Net -871.42 ml   Filed Weights   04/24/21 0555 04/24/21 1526 04/25/21 0525  Weight: 75.7 kg 74.2 kg 75.5 kg    Examination:  General exam: Chronically ill male sitting up in bed, AAOx3, nondistressed HEENT: No JVD CVS: S1-S2, regular rate rhythm Lungs: Poor air movement bilaterally, few basilar rales Abdomen: Soft, nontender, bowel sounds present Extremities: 1+ edema bilaterally Skin: No rashes on exposed skin Psychiatry: Judgement and insight appear normal. Mood & affect appropriate.   Data Reviewed:   CBC: Recent Labs  Lab 04/24/21 0645 04/24/21 0653 04/24/21 0757 04/25/21 0419  WBC 6.7  --   --  5.9  NEUTROABS 4.8  --   --   --   HGB 10.9* 11.6* 10.5* 11.2*  HCT 35.3* 34.0* 31.0* 34.2*  MCV 101.7*  --   --  98.0  PLT 138*  --   --  159  Basic Metabolic Panel: Recent Labs  Lab 04/24/21 0645 04/24/21 0653 04/24/21 0757 04/24/21 1143 04/25/21 0419  NA 140 143 143  --  139  K 3.9 3.9 3.6  --  4.0  CL 102 100  --   --  99  CO2 34*  --   --   --  34*  GLUCOSE 125* 118*  --   --  114*  BUN 11 13  --   --  16  CREATININE 0.88 0.80  --   --  0.87  CALCIUM 8.2*  --   --   --  8.3*  MG  --    --   --  1.5* 1.9   GFR: Estimated Creatinine Clearance: 75.9 mL/min (by C-G formula based on SCr of 0.87 mg/dL). Liver Function Tests: Recent Labs  Lab 04/24/21 0645  AST 20  ALT 23  ALKPHOS 67  BILITOT 1.2  PROT 5.4*  ALBUMIN 2.6*   No results for input(s): LIPASE, AMYLASE in the last 168 hours. No results for input(s): AMMONIA in the last 168 hours. Coagulation Profile: No results for input(s): INR, PROTIME in the last 168 hours. Cardiac Enzymes: No results for input(s): CKTOTAL, CKMB, CKMBINDEX, TROPONINI in the last 168 hours. BNP (last 3 results) No results for input(s): PROBNP in the last 8760 hours. HbA1C: No results for input(s): HGBA1C in the last 72 hours. CBG: No results for input(s): GLUCAP in the last 168 hours. Lipid Profile: No results for input(s): CHOL, HDL, LDLCALC, TRIG, CHOLHDL, LDLDIRECT in the last 72 hours. Thyroid Function Tests: No results for input(s): TSH, T4TOTAL, FREET4, T3FREE, THYROIDAB in the last 72 hours. Anemia Panel: Recent Labs    04/24/21 1143 04/24/21 1227  VITAMINB12 475  --   FOLATE  --  13.2  FERRITIN 278  --   TIBC 214*  --   IRON 35*  --    Urine analysis:    Component Value Date/Time   COLORURINE YELLOW (A) 02/21/2021 0641   APPEARANCEUR HAZY (A) 02/21/2021 0641   APPEARANCEUR Cloudy 04/21/2011 1418   LABSPEC 1.021 02/21/2021 0641   LABSPEC 1.013 04/21/2011 1418   PHURINE 5.0 02/21/2021 0641   GLUCOSEU NEGATIVE 02/21/2021 0641   GLUCOSEU Negative 04/21/2011 1418   HGBUR MODERATE (A) 02/21/2021 0641   BILIRUBINUR NEGATIVE 02/21/2021 0641   BILIRUBINUR Negative 04/21/2011 1418   KETONESUR 5 (A) 02/21/2021 0641   PROTEINUR 30 (A) 02/21/2021 0641   NITRITE POSITIVE (A) 02/21/2021 0641   LEUKOCYTESUR MODERATE (A) 02/21/2021 0641   LEUKOCYTESUR Negative 04/21/2011 1418   Sepsis Labs: @LABRCNTIP (procalcitonin:4,lacticidven:4)  ) Recent Results (from the past 240 hour(s))  Resp Panel by RT-PCR (Flu A&B, Covid)  Nasopharyngeal Swab     Status: None   Collection Time: 04/24/21  6:56 AM   Specimen: Nasopharyngeal Swab; Nasopharyngeal(NP) swabs in vial transport medium  Result Value Ref Range Status   SARS Coronavirus 2 by RT PCR NEGATIVE NEGATIVE Final    Comment: (NOTE) SARS-CoV-2 target nucleic acids are NOT DETECTED.  The SARS-CoV-2 RNA is generally detectable in upper respiratory specimens during the acute phase of infection. The lowest concentration of SARS-CoV-2 viral copies this assay can detect is 138 copies/mL. A negative result does not preclude SARS-Cov-2 infection and should not be used as the sole basis for treatment or other patient management decisions. A negative result may occur with  improper specimen collection/handling, submission of specimen other than nasopharyngeal swab, presence of viral mutation(s) within the areas targeted by this assay,  and inadequate number of viral copies(<138 copies/mL). A negative result must be combined with clinical observations, patient history, and epidemiological information. The expected result is Negative.  Fact Sheet for Patients:  EntrepreneurPulse.com.au  Fact Sheet for Healthcare Providers:  IncredibleEmployment.be  This test is no t yet approved or cleared by the [place] FDA and  has been authorized for detection and/or diagnosis of SARS-CoV-2 by FDA under an Emergency Use Authorization (EUA). This EUA will remain  in effect (meaning this test can be used) for the duration of the COVID-19 declaration under Section 564(b)(1) of the Act, 21 U.S.C.section 360bbb-3(b)(1), unless the authorization is terminated  or revoked sooner.       Influenza A by PCR NEGATIVE NEGATIVE Final   Influenza B by PCR NEGATIVE NEGATIVE Final    Comment: (NOTE) The Xpert Xpress SARS-CoV-2/FLU/RSV plus assay is intended as an aid in the diagnosis of influenza from Nasopharyngeal swab specimens and should not be  used as a sole basis for treatment. Nasal washings and aspirates are unacceptable for Xpert Xpress SARS-CoV-2/FLU/RSV testing.  Fact Sheet for Patients: EntrepreneurPulse.com.au  Fact Sheet for Healthcare Providers: IncredibleEmployment.be  This test is not yet approved or cleared by the [place] FDA and has been authorized for detection and/or diagnosis of SARS-CoV-2 by FDA under an Emergency Use Authorization (EUA). This EUA will remain in effect (meaning this test can be used) for the duration of the COVID-19 declaration under Section 564(b)(1) of the Act, 21 U.S.C. section 360bbb-3(b)(1), unless the authorization is terminated or revoked.  Performed at [facility], [place] [address] [place], [place] [address]   Respiratory (~20 pathogens) panel by PCR     Status: None   Collection Time: 04/24/21  6:56 AM   Specimen: Nasopharyngeal Swab; Respiratory  Result Value Ref Range Status   Adenovirus NOT DETECTED NOT DETECTED Final   Coronavirus 229E NOT DETECTED NOT DETECTED Final    Comment: (NOTE) The Coronavirus on the Respiratory Panel, DOES NOT test for the novel  Coronavirus (2019 nCoV)    Coronavirus HKU1 NOT DETECTED NOT DETECTED Final   Coronavirus NL63 NOT DETECTED NOT DETECTED Final   Coronavirus OC43 NOT DETECTED NOT DETECTED Final   Metapneumovirus NOT DETECTED NOT DETECTED Final   Rhinovirus / Enterovirus NOT DETECTED NOT DETECTED Final   Influenza A NOT DETECTED NOT DETECTED Final   Influenza B NOT DETECTED NOT DETECTED Final   Parainfluenza Virus 1 NOT DETECTED NOT DETECTED Final   Parainfluenza Virus 2 NOT DETECTED NOT DETECTED Final   Parainfluenza Virus 3 NOT DETECTED NOT DETECTED Final   Parainfluenza Virus 4 NOT DETECTED NOT DETECTED Final   Respiratory Syncytial Virus NOT DETECTED NOT DETECTED Final   Bordetella pertussis NOT DETECTED NOT DETECTED Final   Bordetella Parapertussis NOT DETECTED NOT DETECTED Final    Chlamydophila pneumoniae NOT DETECTED NOT DETECTED Final   Mycoplasma pneumoniae NOT DETECTED NOT DETECTED Final    Comment: Performed at [facility], [place]. [address] [place], [place] [address]         Radiology Studies: DG Chest Port 1 View  Result Date: 04/24/2021 CLINICAL DATA:  Shortness of breath EXAM: PORTABLE CHEST 1 VIEW COMPARISON:  Prior chest x-ray 03/18/2021 FINDINGS: Cardiomegaly. Increased pulmonary vascular congestion with perihilar and bibasilar airspace infiltrates. No large effusion. No pneumothorax. Atherosclerotic calcifications present in the transverse aorta. Questionable 7-8 mm nodule in the right upper lobe. IMPRESSION: 1. Increased pulmonary vascular congestion with bilateral perihilar and bibasilar interstitial and  airspace opacities. Overall, the imaging appearance is most suggestive of CHF. However, given relatively dependent distribution aspiration is a consideration in the appropriate clinical context. 2. Questionable 7-8 mm right upper lobe pulmonary nodule not evident on prior imaging. Consider follow-up with noncontrast enhanced CT scan of the chest following resolution of the patient's acute symptoms. Electronically Signed   By: Jacqulynn Cadet M.D.   On: 04/24/2021 06:43   ECHOCARDIOGRAM COMPLETE  Result Date: 04/25/2021    ECHOCARDIOGRAM REPORT   Patient Name:   [name] Date of Exam: 04/25/2021 Medical Rec #:  [id number]      Height:       72.0 in Accession #:    [id number]     Weight:       166.4 lb Date of Birth:  08-15-1945      BSA:          1.970 m Patient Age:    89 years       BP:           155/69 mmHg Patient Gender: M              HR:           75 bpm. Exam Location:  Inpatient Procedure: 2D Echo, Cardiac Doppler and Color Doppler Indications:     CHF/ ELEV. TROPONIN  History:         Patient has prior history of Echocardiogram examinations, most                  recent 02/21/2021. CAD and Previous Myocardial Infarction, COPD                   and Stroke; Risk Factors:Hypertension and Dyslipidemia. GERD.  Sonographer:     Beryle Beams Referring Phys:  [id number] Orma Flaming Diagnosing Phys: Oswaldo Milian MD IMPRESSIONS  1. Left ventricular ejection fraction, by estimation, is 30 to 35%. The left ventricle has moderately decreased function. The left ventricle demonstrates global hypokinesis. There is mild left ventricular hypertrophy. Left ventricular diastolic parameters are consistent with Grade I diastolic dysfunction (impaired relaxation).  2. Right ventricular systolic function is normal. The right ventricular size is mildly enlarged. There is moderately elevated pulmonary artery systolic pressure. The estimated right ventricular systolic pressure is 54.6 mmHg.  3. Left atrial size was mildly dilated.  4. Right atrial size was severely dilated.  5. The mitral valve is normal in structure. Mild mitral valve regurgitation.  6. The aortic valve was not well visualized. Aortic valve regurgitation is not visualized. No aortic stenosis is present.  7. The inferior vena cava is dilated in size with >50% respiratory variability, suggesting right atrial pressure of 8 mmHg. FINDINGS  Left Ventricle: Left ventricular ejection fraction, by estimation, is 30 to 35%. The left ventricle has moderately decreased function. The left ventricle demonstrates global hypokinesis. The left ventricular internal cavity size was normal in size. There is mild left ventricular hypertrophy. Left ventricular diastolic parameters are consistent with Grade I diastolic dysfunction (impaired relaxation). Right Ventricle: The right ventricular size is mildly enlarged. No increase in right ventricular wall thickness. Right ventricular systolic function is normal. There is moderately elevated pulmonary artery systolic pressure. The tricuspid regurgitant velocity is 3.41 m/s, and with an assumed right atrial pressure of 8 mmHg, the estimated right ventricular systolic pressure is  50.3 mmHg. Left Atrium: Left atrial size was mildly dilated. Right Atrium: Right atrial size was severely dilated. Pericardium: There is no evidence of  pericardial effusion. Mitral Valve: The mitral valve is normal in structure. Mild mitral valve regurgitation. MV peak gradient, 87.2 mmHg. The mean mitral valve gradient is 58.0 mmHg. Tricuspid Valve: The tricuspid valve is normal in structure. Tricuspid valve regurgitation is mild. Aortic Valve: The aortic valve was not well visualized. Aortic valve regurgitation is not visualized. No aortic stenosis is present. Aortic valve mean gradient measures 5.0 mmHg. Aortic valve peak gradient measures 9.4 mmHg. Aortic valve area, by VTI measures 2.64 cm. Pulmonic Valve: The pulmonic valve was not well visualized. Pulmonic valve regurgitation is not visualized. Aorta: The aortic root and ascending aorta are structurally normal, with no evidence of dilitation. Venous: The inferior vena cava is dilated in size with greater than 50% respiratory variability, suggesting right atrial pressure of 8 mmHg. IAS/Shunts: The interatrial septum was not well visualized.  LEFT VENTRICLE PLAX 2D LVIDd:         5.60 cm      Diastology LVIDs:         3.90 cm      LV e' medial:    6.42 cm/s LV PW:         1.30 cm      LV E/e' medial:  13.2 LV IVS:        1.40 cm      LV e' lateral:   7.29 cm/s LVOT diam:     2.20 cm      LV E/e' lateral: 11.6 LV SV:         89 LV SV Index:   45 LVOT Area:     3.80 cm  LV Volumes (MOD) LV vol d, MOD A2C: 177.0 ml LV vol d, MOD A4C: 219.0 ml LV vol s, MOD A2C: 123.0 ml LV vol s, MOD A4C: 150.0 ml LV SV MOD A2C:     54.0 ml LV SV MOD A4C:     219.0 ml LV SV MOD BP:      58.0 ml RIGHT VENTRICLE             IVC RV S prime:     14.30 cm/s  IVC diam: 2.00 cm TAPSE (M-mode): 3.0 cm LEFT ATRIUM             Index        RIGHT ATRIUM           Index LA diam:        4.10 cm 2.08 cm/m   RA Area:     26.70 cm LA Vol (A2C):   75.5 ml 38.32 ml/m  RA Volume:   101.00 ml  51.26 ml/m LA Vol (A4C):   72.4 ml 36.74 ml/m LA Biplane Vol: 74.5 ml 37.81 ml/m  AORTIC VALVE                     PULMONIC VALVE AV Area (Vmax):    2.71 cm      PV Vmax:       0.67 m/s AV Area (Vmean):   2.51 cm      PV Vmean:      48.000 cm/s AV Area (VTI):     2.64 cm      PV VTI:        0.182 m AV Vmax:           153.00 cm/s   PV Peak grad:  1.8 mmHg AV Vmean:          103.000 cm/s  PV Mean grad:  1.0 mmHg AV VTI:            0.337 m AV Peak Grad:      9.4 mmHg AV Mean Grad:      5.0 mmHg LVOT Vmax:         109.00 cm/s LVOT Vmean:        68.100 cm/s LVOT VTI:          0.234 m LVOT/AV VTI ratio: 0.69  AORTA Ao Root diam: 2.50 cm Ao Asc diam:  2.80 cm MITRAL VALVE                TRICUSPID VALVE MV Area (PHT): 3.74 cm     TV Peak grad:   40.7 mmHg MV Area VTI:   0.51 cm     TV Mean grad:   28.0 mmHg MV Peak grad:  87.2 mmHg    TV Vmax:        3.19 m/s MV Mean grad:  58.0 mmHg    TV Vmean:       248.0 cm/s MV Vmax:       4.67 m/s     TV VTI:         1.14 msec MV Vmean:      362.0 cm/s   TR Peak grad:   46.5 mmHg MV Decel Time: 203 msec     TR Vmax:        341.00 cm/s MV E velocity: 84.80 cm/s MV A velocity: 115.00 cm/s  SHUNTS MV E/A ratio:  0.74         Systemic VTI:  0.23 m                             Systemic Diam: 2.20 cm Oswaldo Milian MD Electronically signed by Oswaldo Milian MD Signature Date/Time: 04/25/2021/10:31:57 AM    Final (Updated)         Scheduled Meds:  atorvastatin  80 mg Oral Daily   carvedilol  3.125 mg Oral BID   furosemide  40 mg Intravenous Q12H   ipratropium-albuterol  3 mL Nebulization QID   mometasone-formoterol  2 puff Inhalation BID   pantoprazole  80 mg Oral Daily   sacubitril-valsartan  1 tablet Oral BID   sodium chloride flush  3 mL Intravenous Q12H   Continuous Infusions:  sodium chloride     cefTRIAXone (ROCEPHIN)  IV 1 g (04/24/21 1705)     LOS: 1 day    Time spent: 60min   Domenic Polite, MD [facility]   04/25/2021, 11:25 AM

## 2021-04-25 NOTE — TOC Progression Note (Signed)
Transition of Care [facility]) - Progression Note    Patient Details  Name: [name] MRN: [id number] Date of Birth: 1945/11/08  Transition of Care [facility]) CM/SW Contact  Zenon Mayo, RN Phone Number: 04/25/2021, 3:00 PM  Clinical Narrative:     Transition of Care [facility]) Screening Note   Patient Details  Name: [name] Date of Birth: February 20, 1946   Transition of Care [facility]) CM/SW Contact:    Zenon Mayo, RN Phone Number: 04/25/2021, 3:00 PM    Transition of Care Department [facility]) has reviewed patient and no TOC needs have been identified at this time. We will continue to monitor patient advancement through interdisciplinary progression rounds. If new patient transition needs arise, please place a TOC consult.          Expected Discharge Plan and Services                                                 Social Determinants of Health (SDOH) Interventions    Readmission Risk Interventions No flowsheet data found.

## 2021-04-25 NOTE — Telephone Encounter (Signed)
Caregiver dropped off 2022 SSA 1099 Benefit statement, placed in box

## 2021-04-26 DIAGNOSIS — I5043 Acute on chronic combined systolic (congestive) and diastolic (congestive) heart failure: Secondary | ICD-10-CM | POA: Diagnosis not present

## 2021-04-26 LAB — CBC
HCT: 36.5 % — ABNORMAL LOW (ref 39.0–52.0)
Hemoglobin: 11.7 g/dL — ABNORMAL LOW (ref 13.0–17.0)
MCH: 31 pg (ref 26.0–34.0)
MCHC: 32.1 g/dL (ref 30.0–36.0)
MCV: 96.8 fL (ref 80.0–100.0)
Platelets: 170 10*3/uL (ref 150–400)
RBC: 3.77 MIL/uL — ABNORMAL LOW (ref 4.22–5.81)
RDW: 14.8 % (ref 11.5–15.5)
WBC: 7.5 10*3/uL (ref 4.0–10.5)
nRBC: 0 % (ref 0.0–0.2)

## 2021-04-26 LAB — BASIC METABOLIC PANEL
Anion gap: 9 (ref 5–15)
BUN: 20 mg/dL (ref 8–23)
CO2: 33 mmol/L — ABNORMAL HIGH (ref 22–32)
Calcium: 8.1 mg/dL — ABNORMAL LOW (ref 8.9–10.3)
Chloride: 98 mmol/L (ref 98–111)
Creatinine, Ser: 0.86 mg/dL (ref 0.61–1.24)
GFR, Estimated: >60 mL/min (ref >60)
Glucose, Bld: 94 mg/dL (ref 70–99)
Potassium: 3.9 mmol/L (ref 3.5–5.1)
Sodium: 140 mmol/L (ref 135–145)

## 2021-04-26 MED ORDER — SPIRONOLACTONE 12.5 MG HALF TABLET
12.5000 mg | ORAL_TABLET | Freq: Every day | ORAL | Status: DC
  Administered 2021-04-26 – 2021-04-28 (×3): 12.5 mg via ORAL
  Filled 2021-04-26 (×3): qty 1

## 2021-04-26 NOTE — Progress Notes (Signed)
Mobility Specialist Progress Note:   04/26/21 1315  Mobility  Activity Ambulated in hall  Level of Assistance Modified independent, requires aide device or extra time  Assistive Device Front wheel walker  Distance Ambulated (ft) 230 ft  Mobility Ambulated with assistance in hallway  Mobility Response Tolerated well  Mobility performed by Mobility specialist  $Mobility charge 1 Mobility   Pt received in bed willing to participate in mobility. No complaints of pain. Pt returned to bed with call bell in reach and all needs met.  [facility] Public librarian Phone [phone] Secondary Phone [phone]

## 2021-04-26 NOTE — Progress Notes (Signed)
PROGRESS NOTE    [name]  EYC:[id number] DOB: 02-15-1946 DOA: 04/24/2021 PCP: Rusty Aus, MD  Brief Narrative: 75/M with history of chronic combined systolic and diastolic CHF, EF 14-97%, COPD/chronic respiratory failure on 2 L home O2, CAD, hypertension, dyslipidemia, tobacco abuse, AAA, pulmonary nodule presented to the ED with acute onset dyspnea, patient also noticed worsening lower extremity edema in the past few weeks, and orthopnea. -Also reported some productive cough, no fevers or chills -In the ED he was afebrile, O2 sats were 99% on nonrebreather mask, BNP was 3545, troponin was 372, COVID and flu PCR were negative, chest x-ray noted pulmonary vascular congestion with bilateral perihilar interstitial and airspace opacities, pulmonary nodule. -He was given Lasix, duo nebs, Solu-Medrol in ED and admitted   Assessment & Plan:   Acute on chronic systolic, diastolic CHF, dilated cardiomyopathy -previous echo w/ EF of 30-35% -Clinically improving with diuresis, continue IV Lasix today -Continue Entresto, add low-dose Aldactone, continue home dose of carvedilol -He is 4.2 L negative -Monitor I's/O, daily weights -Reviewed cardiology notes by Dr. Rockey Situ 01/19/2021, patient has only wanted conservative management, declined cath and defibrillator, excessive medications etc. -BMP in a.m.  Elevated troponin -With flat trend, clinically suspect demand ischemia -Patient denies any chest pain, no acute findings on EKG -Case was discussed with cardiology on admission, demand ischemia suspected, recommended to trend troponin -2D echocardiogram with EF of 30-35%, global hypokinesis noted-unchanged from prior  Bronchitis, COPD exacerbation -No wheezing at this time, started on steroids, ceftriaxone and nebs on admission  -Clinically do not suspect infection at this time, discontinue antibiotics, off steroids, continue duo nebs   History of CAD -See discussion above,  -History of  BMS to circumflex in 2002 and BMS to LAD in 2012 -Continue carvedilol -Per cardiologist notes has declined cardiac cath  Chronic anemia -Hemoglobin stable, anemia panel with iron deficiency, add oral iron at discharge  History of pulmonary nodule -Needs follow-up   COPD Tobacco abuse -Quit 5 weeks ago, declines nicotine patch -Labs as above  History of AAA -Status post aorto bi-iliac stent graft  History of intraparenchymal hemorrhage  DVT prophylaxis: SCDs, add lovenox Code Status: DNR Family Communication: Discussed patient in detail, no family at bedside Disposition Plan: Home pending clinical improvement Status is: Inpatient Inpatient.  Appropriate due to severity of illness  Consultants:  None, discussed with cardiology on admission  Procedures:   Antimicrobials:    Subjective: -Breathing improving overall, improved activity  Objective: Vitals:   04/26/21 0339 04/26/21 0342 04/26/21 0721 04/26/21 0743  BP:  (!) 143/72 (!) 159/82   Pulse:  65 70   Resp:  17 19   Temp:  (P) 98.5 F (36.9 C) 98.4 F (36.9 C)   TempSrc:  (P) Oral Oral   SpO2:  92% 94% 96%  Weight: 71.8 kg     Height:        Intake/Output Summary (Last 24 hours) at 04/26/2021 1320 Last data filed at 04/26/2021 1241 Gross per 24 hour  Intake 980 ml  Output 3400 ml  Net -2420 ml   Filed Weights   04/24/21 1526 04/25/21 0525 04/26/21 0339  Weight: 74.2 kg 75.5 kg 71.8 kg    Examination:  General exam: Chronically ill male sitting up in bed, AAOx3, no distress HEENT: Positive JVD CVS: S1-S2, regular rate rhythm Lungs: Improving air movement, few basilar rales Abdomen: Soft, nontender, bowel sounds present Extremities: 1+ edema bilaterally, compression stockings on Skin: No rashes on exposed skin  Psychiatry:  Mood & affect appropriate.   Data Reviewed:   CBC: Recent Labs  Lab 04/24/21 0645 04/24/21 0653 04/24/21 0757 04/25/21 0419 04/26/21 0318  WBC 6.7  --   --  5.9 7.5   NEUTROABS 4.8  --   --   --   --   HGB 10.9* 11.6* 10.5* 11.2* 11.7*  HCT 35.3* 34.0* 31.0* 34.2* 36.5*  MCV 101.7*  --   --  98.0 96.8  PLT 138*  --   --  159 654   Basic Metabolic Panel: Recent Labs  Lab 04/24/21 0645 04/24/21 0653 04/24/21 0757 04/24/21 1143 04/25/21 0419 04/26/21 0318  NA 140 143 143  --  139 140  K 3.9 3.9 3.6  --  4.0 3.9  CL 102 100  --   --  99 98  CO2 34*  --   --   --  34* 33*  GLUCOSE 125* 118*  --   --  114* 94  BUN 11 13  --   --  16 20  CREATININE 0.88 0.80  --   --  0.87 0.86  CALCIUM 8.2*  --   --   --  8.3* 8.1*  MG  --   --   --  1.5* 1.9  --    GFR: Estimated Creatinine Clearance: 73.2 mL/min (by C-G formula based on SCr of 0.86 mg/dL). Liver Function Tests: Recent Labs  Lab 04/24/21 0645  AST 20  ALT 23  ALKPHOS 67  BILITOT 1.2  PROT 5.4*  ALBUMIN 2.6*   No results for input(s): LIPASE, AMYLASE in the last 168 hours. No results for input(s): AMMONIA in the last 168 hours. Coagulation Profile: No results for input(s): INR, PROTIME in the last 168 hours. Cardiac Enzymes: No results for input(s): CKTOTAL, CKMB, CKMBINDEX, TROPONINI in the last 168 hours. BNP (last 3 results) No results for input(s): PROBNP in the last 8760 hours. HbA1C: No results for input(s): HGBA1C in the last 72 hours. CBG: No results for input(s): GLUCAP in the last 168 hours. Lipid Profile: No results for input(s): CHOL, HDL, LDLCALC, TRIG, CHOLHDL, LDLDIRECT in the last 72 hours. Thyroid Function Tests: No results for input(s): TSH, T4TOTAL, FREET4, T3FREE, THYROIDAB in the last 72 hours. Anemia Panel: Recent Labs    04/24/21 1143 04/24/21 1227  VITAMINB12 475  --   FOLATE  --  13.2  FERRITIN 278  --   TIBC 214*  --   IRON 35*  --    Urine analysis:    Component Value Date/Time   COLORURINE YELLOW (A) 02/21/2021 0641   APPEARANCEUR HAZY (A) 02/21/2021 0641   APPEARANCEUR Cloudy 04/21/2011 1418   LABSPEC 1.021 02/21/2021 0641   LABSPEC  1.013 04/21/2011 1418   PHURINE 5.0 02/21/2021 0641   GLUCOSEU NEGATIVE 02/21/2021 0641   GLUCOSEU Negative 04/21/2011 1418   HGBUR MODERATE (A) 02/21/2021 0641   BILIRUBINUR NEGATIVE 02/21/2021 0641   BILIRUBINUR Negative 04/21/2011 1418   KETONESUR 5 (A) 02/21/2021 0641   PROTEINUR 30 (A) 02/21/2021 0641   NITRITE POSITIVE (A) 02/21/2021 0641   LEUKOCYTESUR MODERATE (A) 02/21/2021 0641   LEUKOCYTESUR Negative 04/21/2011 1418   Sepsis Labs: @LABRCNTIP (procalcitonin:4,lacticidven:4)  ) Recent Results (from the past 240 hour(s))  Resp Panel by RT-PCR (Flu A&B, Covid) Nasopharyngeal Swab     Status: None   Collection Time: 04/24/21  6:56 AM   Specimen: Nasopharyngeal Swab; Nasopharyngeal(NP) swabs in vial transport medium  Result Value Ref Range Status  SARS Coronavirus 2 by RT PCR NEGATIVE NEGATIVE Final    Comment: (NOTE) SARS-CoV-2 target nucleic acids are NOT DETECTED.  The SARS-CoV-2 RNA is generally detectable in upper respiratory specimens during the acute phase of infection. The lowest concentration of SARS-CoV-2 viral copies this assay can detect is 138 copies/mL. A negative result does not preclude SARS-Cov-2 infection and should not be used as the sole basis for treatment or other patient management decisions. A negative result may occur with  improper specimen collection/handling, submission of specimen other than nasopharyngeal swab, presence of viral mutation(s) within the areas targeted by this assay, and inadequate number of viral copies(<138 copies/mL). A negative result must be combined with clinical observations, patient history, and epidemiological information. The expected result is Negative.  Fact Sheet for Patients:  EntrepreneurPulse.com.au  Fact Sheet for Healthcare Providers:  IncredibleEmployment.be  This test is no t yet approved or cleared by the [place] FDA and  has been authorized for detection and/or  diagnosis of SARS-CoV-2 by FDA under an Emergency Use Authorization (EUA). This EUA will remain  in effect (meaning this test can be used) for the duration of the COVID-19 declaration under Section 564(b)(1) of the Act, 21 U.S.C.section 360bbb-3(b)(1), unless the authorization is terminated  or revoked sooner.       Influenza A by PCR NEGATIVE NEGATIVE Final   Influenza B by PCR NEGATIVE NEGATIVE Final    Comment: (NOTE) The Xpert Xpress SARS-CoV-2/FLU/RSV plus assay is intended as an aid in the diagnosis of influenza from Nasopharyngeal swab specimens and should not be used as a sole basis for treatment. Nasal washings and aspirates are unacceptable for Xpert Xpress SARS-CoV-2/FLU/RSV testing.  Fact Sheet for Patients: EntrepreneurPulse.com.au  Fact Sheet for Healthcare Providers: IncredibleEmployment.be  This test is not yet approved or cleared by the [place] FDA and has been authorized for detection and/or diagnosis of SARS-CoV-2 by FDA under an Emergency Use Authorization (EUA). This EUA will remain in effect (meaning this test can be used) for the duration of the COVID-19 declaration under Section 564(b)(1) of the Act, 21 U.S.C. section 360bbb-3(b)(1), unless the authorization is terminated or revoked.  Performed at [facility], [place] [address], [place], [place] [address]   Respiratory (~20 pathogens) panel by PCR     Status: None   Collection Time: 04/24/21  6:56 AM   Specimen: Nasopharyngeal Swab; Respiratory  Result Value Ref Range Status   Adenovirus NOT DETECTED NOT DETECTED Final   Coronavirus 229E NOT DETECTED NOT DETECTED Final    Comment: (NOTE) The Coronavirus on the Respiratory Panel, DOES NOT test for the novel  Coronavirus (2019 nCoV)    Coronavirus HKU1 NOT DETECTED NOT DETECTED Final   Coronavirus NL63 NOT DETECTED NOT DETECTED Final   Coronavirus OC43 NOT DETECTED NOT DETECTED Final   Metapneumovirus  NOT DETECTED NOT DETECTED Final   Rhinovirus / Enterovirus NOT DETECTED NOT DETECTED Final   Influenza A NOT DETECTED NOT DETECTED Final   Influenza B NOT DETECTED NOT DETECTED Final   Parainfluenza Virus 1 NOT DETECTED NOT DETECTED Final   Parainfluenza Virus 2 NOT DETECTED NOT DETECTED Final   Parainfluenza Virus 3 NOT DETECTED NOT DETECTED Final   Parainfluenza Virus 4 NOT DETECTED NOT DETECTED Final   Respiratory Syncytial Virus NOT DETECTED NOT DETECTED Final   Bordetella pertussis NOT DETECTED NOT DETECTED Final   Bordetella Parapertussis NOT DETECTED NOT DETECTED Final   Chlamydophila pneumoniae NOT DETECTED NOT DETECTED Final   Mycoplasma pneumoniae  NOT DETECTED NOT DETECTED Final    Comment: Performed at [facility], [place] [address], [place], [place] [address]         Radiology Studies: ECHOCARDIOGRAM COMPLETE  Result Date: 04/25/2021    ECHOCARDIOGRAM REPORT   Patient Name:   [name] Date of Exam: 04/25/2021 Medical Rec #:  [id number]      Height:       72.0 in Accession #:    [id number]     Weight:       166.4 lb Date of Birth:  July 25, 1945      BSA:          1.970 m Patient Age:    25 years       BP:           155/69 mmHg Patient Gender: M              HR:           75 bpm. Exam Location:  Inpatient Procedure: 2D Echo, Cardiac Doppler and Color Doppler Indications:     CHF/ ELEV. TROPONIN  History:         Patient has prior history of Echocardiogram examinations, most                  recent 02/21/2021. CAD and Previous Myocardial Infarction, COPD                  and Stroke; Risk Factors:Hypertension and Dyslipidemia. GERD.  Sonographer:     Beryle Beams Referring Phys:  [id number] Orma Flaming Diagnosing Phys: Oswaldo Milian MD IMPRESSIONS  1. Left ventricular ejection fraction, by estimation, is 30 to 35%. The left ventricle has moderately decreased function. The left ventricle demonstrates global hypokinesis. There is mild left ventricular hypertrophy. Left  ventricular diastolic parameters are consistent with Grade I diastolic dysfunction (impaired relaxation).  2. Right ventricular systolic function is normal. The right ventricular size is mildly enlarged. There is moderately elevated pulmonary artery systolic pressure. The estimated right ventricular systolic pressure is 72.5 mmHg.  3. Left atrial size was mildly dilated.  4. Right atrial size was severely dilated.  5. The mitral valve is normal in structure. Mild mitral valve regurgitation.  6. The aortic valve was not well visualized. Aortic valve regurgitation is not visualized. No aortic stenosis is present.  7. The inferior vena cava is dilated in size with >50% respiratory variability, suggesting right atrial pressure of 8 mmHg. FINDINGS  Left Ventricle: Left ventricular ejection fraction, by estimation, is 30 to 35%. The left ventricle has moderately decreased function. The left ventricle demonstrates global hypokinesis. The left ventricular internal cavity size was normal in size. There is mild left ventricular hypertrophy. Left ventricular diastolic parameters are consistent with Grade I diastolic dysfunction (impaired relaxation). Right Ventricle: The right ventricular size is mildly enlarged. No increase in right ventricular wall thickness. Right ventricular systolic function is normal. There is moderately elevated pulmonary artery systolic pressure. The tricuspid regurgitant velocity is 3.41 m/s, and with an assumed right atrial pressure of 8 mmHg, the estimated right ventricular systolic pressure is 36.6 mmHg. Left Atrium: Left atrial size was mildly dilated. Right Atrium: Right atrial size was severely dilated. Pericardium: There is no evidence of pericardial effusion. Mitral Valve: The mitral valve is normal in structure. Mild mitral valve regurgitation. MV peak gradient, 87.2 mmHg. The mean mitral valve gradient is 58.0 mmHg. Tricuspid Valve: The tricuspid valve is normal in structure. Tricuspid valve  regurgitation  is mild. Aortic Valve: The aortic valve was not well visualized. Aortic valve regurgitation is not visualized. No aortic stenosis is present. Aortic valve mean gradient measures 5.0 mmHg. Aortic valve peak gradient measures 9.4 mmHg. Aortic valve area, by VTI measures 2.64 cm. Pulmonic Valve: The pulmonic valve was not well visualized. Pulmonic valve regurgitation is not visualized. Aorta: The aortic root and ascending aorta are structurally normal, with no evidence of dilitation. Venous: The inferior vena cava is dilated in size with greater than 50% respiratory variability, suggesting right atrial pressure of 8 mmHg. IAS/Shunts: The interatrial septum was not well visualized.  LEFT VENTRICLE PLAX 2D LVIDd:         5.60 cm      Diastology LVIDs:         3.90 cm      LV e' medial:    6.42 cm/s LV PW:         1.30 cm      LV E/e' medial:  13.2 LV IVS:        1.40 cm      LV e' lateral:   7.29 cm/s LVOT diam:     2.20 cm      LV E/e' lateral: 11.6 LV SV:         89 LV SV Index:   45 LVOT Area:     3.80 cm  LV Volumes (MOD) LV vol d, MOD A2C: 177.0 ml LV vol d, MOD A4C: 219.0 ml LV vol s, MOD A2C: 123.0 ml LV vol s, MOD A4C: 150.0 ml LV SV MOD A2C:     54.0 ml LV SV MOD A4C:     219.0 ml LV SV MOD BP:      58.0 ml RIGHT VENTRICLE             IVC RV S prime:     14.30 cm/s  IVC diam: 2.00 cm TAPSE (M-mode): 3.0 cm LEFT ATRIUM             Index        RIGHT ATRIUM           Index LA diam:        4.10 cm 2.08 cm/m   RA Area:     26.70 cm LA Vol (A2C):   75.5 ml 38.32 ml/m  RA Volume:   101.00 ml 51.26 ml/m LA Vol (A4C):   72.4 ml 36.74 ml/m LA Biplane Vol: 74.5 ml 37.81 ml/m  AORTIC VALVE                     PULMONIC VALVE AV Area (Vmax):    2.71 cm      PV Vmax:       0.67 m/s AV Area (Vmean):   2.51 cm      PV Vmean:      48.000 cm/s AV Area (VTI):     2.64 cm      PV VTI:        0.182 m AV Vmax:           153.00 cm/s   PV Peak grad:  1.8 mmHg AV Vmean:          103.000 cm/s  PV Mean grad:  1.0  mmHg AV VTI:            0.337 m AV Peak Grad:      9.4 mmHg AV Mean Grad:      5.0 mmHg LVOT Vmax:  109.00 cm/s LVOT Vmean:        68.100 cm/s LVOT VTI:          0.234 m LVOT/AV VTI ratio: 0.69  AORTA Ao Root diam: 2.50 cm Ao Asc diam:  2.80 cm MITRAL VALVE                TRICUSPID VALVE MV Area (PHT): 3.74 cm     TV Peak grad:   40.7 mmHg MV Area VTI:   0.51 cm     TV Mean grad:   28.0 mmHg MV Peak grad:  87.2 mmHg    TV Vmax:        3.19 m/s MV Mean grad:  58.0 mmHg    TV Vmean:       248.0 cm/s MV Vmax:       4.67 m/s     TV VTI:         1.14 msec MV Vmean:      362.0 cm/s   TR Peak grad:   46.5 mmHg MV Decel Time: 203 msec     TR Vmax:        341.00 cm/s MV E velocity: 84.80 cm/s MV A velocity: 115.00 cm/s  SHUNTS MV E/A ratio:  0.74         Systemic VTI:  0.23 m                             Systemic Diam: 2.20 cm Oswaldo Milian MD Electronically signed by Oswaldo Milian MD Signature Date/Time: 04/25/2021/10:31:57 AM    Final (Updated)         Scheduled Meds:  atorvastatin  80 mg Oral Daily   carvedilol  3.125 mg Oral BID   furosemide  40 mg Intravenous Q12H   mometasone-formoterol  2 puff Inhalation BID   pantoprazole  80 mg Oral Daily   sacubitril-valsartan  1 tablet Oral BID   sodium chloride flush  3 mL Intravenous Q12H   spironolactone  12.5 mg Oral Daily   Continuous Infusions:  sodium chloride     cefTRIAXone (ROCEPHIN)  IV 1 g (04/25/21 1335)     LOS: 2 days    Time spent: 8min   Domenic Polite, MD [facility]   04/26/2021, 1:20 PM

## 2021-04-26 NOTE — Plan of Care (Signed)
°  Problem: Education: °Goal: Ability to demonstrate management of disease process will improve °Outcome: Progressing °  °Problem: Education: °Goal: Ability to verbalize understanding of medication therapies will improve °Outcome: Progressing °  °Problem: Activity: °Goal: Capacity to carry out activities will improve °Outcome: Progressing °  °

## 2021-04-26 NOTE — TOC Progression Note (Signed)
Transition of Care [facility]) - Progression Note    Patient Details  Name: [name] MRN: [id number] Date of Birth: [birth year]  Transition of [facility] CM/SW Contact  Zenon Mayo, RN Phone Number: 04/26/2021, 4:32 PM  Clinical Narrative:    CHF continue diuresing, from home with spouse, has home oxygen for copd, added aldactone today, plan to dc by Thursday, may benefit from pt eval.         Expected Discharge Plan and Services                                                 Social Determinants of Health (SDOH) Interventions    Readmission Risk Interventions No flowsheet data found.

## 2021-04-26 NOTE — Evaluation (Signed)
Physical Therapy Evaluation Patient Details Name: [name] MRN: [id number] DOB: [birth year] Today's Date: 04/26/2021  History of Present Illness  pt is a 76 y/o male admitted 1/8 with SOB due to acute on chronic systolic and diastolic HF with dilated CM with elevated troponin and COPD exacerbation.  PMHx: s/dCHF, COPD, chronic resp failure on 2L home O2, CAD, HTN, AAA,  Clinical Impression  Pt admitted with/for SOB due to problems as stated.  Pt mobile without need for assist, but at min guard level with more stable AD than pt usually uses..  Pt currently limited functionally due to the problems listed below.  (see problems list.)  Pt will benefit from PT to maximize function and safety to be able to get home safely with available assist.        Recommendations for follow up therapy are one component of a multi-disciplinary discharge planning process, led by the attending physician.  Recommendations may be updated based on patient status, additional functional criteria and insurance authorization.  Follow Up Recommendations Home health PT    Assistance Recommended at Discharge Intermittent Supervision/Assistance  Patient can return home with the following  A little help with walking and/or transfers;A lot of help with bathing/dressing/bathroom;Other (comment) (initially by sons)    Equipment Recommendations None recommended by PT  Recommendations for Other Services       Functional Status Assessment Patient has had a recent decline in their functional status and demonstrates the ability to make significant improvements in function in a reasonable and predictable amount of time.     Precautions / Restrictions Precautions Precautions: Fall      Mobility  Bed Mobility Overal bed mobility: Needs Assistance Bed Mobility: Supine to Sit;Sit to Supine     Supine to sit: Supervision Sit to supine: Supervision        Transfers Overall transfer level: Needs assistance    Transfers: Sit to/from Stand Sit to Stand: Min guard           General transfer comment: slow to stand, no assist    Ambulation/Gait Ambulation/Gait assistance: Min guard Gait Distance (Feet): 250 Feet Assistive device: IV Pole;Straight cane Gait Pattern/deviations: Step-through pattern   Gait velocity interpretation: <1.8 ft/sec, indicate of risk for recurrent falls   General Gait Details: pt used both cane and iv pole today with occasional min assist when tried to use only cane.  pt was steady with 2 handed AD, but not just cane as has been typical lately  Stairs            Wheelchair Mobility    Modified Rankin (Stroke Patients Only)       Balance Overall balance assessment: Needs assistance Sitting-balance support: No upper extremity supported;Feet supported Sitting balance-Leahy Scale: Good     Standing balance support: Single extremity supported;During functional activity Standing balance-Leahy Scale: Poor Standing balance comment: reliant on at least 1 UE                             Pertinent Vitals/Pain Pain Assessment: Faces Faces Pain Scale: No hurt Pain Intervention(s): Monitored during session    Home Living Family/patient expects to be discharged to:: Private residence Living Arrangements: Spouse/significant other Available Help at Discharge: Family;Available 24 hours/day Type of Home: House Home Access: Ramped entrance       Home Layout: One level Home Equipment: Conservation officer, nature (2 wheels);Cane - single point;Shower seat Additional Comments: lately has been using his  RW to ambulate as much as the cane and states that he does feel more safe with the RW    Prior Function Prior Level of Function : Independent/Modified Independent             Mobility Comments: pt uses cane for "safety" per his report ADLs Comments: has been able to do his own ADL's, was still trying to help his wife, but recently have gotten outside help  from 2-6 daily until sons can get home to help and spend the night.     Hand Dominance   Dominant Hand: Right    Extremity/Trunk Assessment   Upper Extremity Assessment Upper Extremity Assessment: Overall WFL for tasks assessed    Lower Extremity Assessment Lower Extremity Assessment: Generalized weakness;Overall [facility] for tasks assessed    Cervical / Trunk Assessment Cervical / Trunk Assessment: Kyphotic  Communication   Communication: Expressive difficulties  Cognition Arousal/Alertness: Awake/alert Behavior During Therapy: WFL for tasks assessed/performed Overall Cognitive Status: Within Functional Limits for tasks assessed                                          General Comments General comments (skin integrity, edema, etc.): vss    Exercises     Assessment/Plan    PT Assessment Patient needs continued PT services  PT Problem List Decreased strength;Decreased activity tolerance;Decreased balance;Decreased mobility;Decreased knowledge of use of DME;Cardiopulmonary status limiting activity       PT Treatment Interventions Gait training;DME instruction;Functional mobility training;Therapeutic activities;Balance training;Patient/family education    PT Goals (Current goals can be found in the Care Plan section)  Acute Rehab PT Goals Patient Stated Goal: home able to help care for my wife with decreased burden on my sons PT Goal Formulation: With patient Time For Goal Achievement: 05/10/21 Potential to Achieve Goals: Good    Frequency Min 3X/week     Co-evaluation               AM-PAC PT "6 Clicks" Mobility  Outcome Measure Help needed turning from your back to your side while in a flat bed without using bedrails?: A Little Help needed moving from lying on your back to sitting on the side of a flat bed without using bedrails?: A Little Help needed moving to and from a bed to a chair (including a wheelchair)?: A Little Help needed  standing up from a chair using your arms (e.g., wheelchair or bedside chair)?: A Little Help needed to walk in hospital room?: A Little Help needed climbing 3-5 steps with a railing? : A Little 6 Click Score: 18    End of Session   Activity Tolerance: Patient tolerated treatment well;Patient limited by fatigue Patient left: in bed;with call bell/phone within reach;with bed alarm set Nurse Communication: Mobility status PT Visit Diagnosis: Unsteadiness on feet (R26.81);Other abnormalities of gait and mobility (R26.89)    Time: 1730-1750 PT Time Calculation (min) (ACUTE ONLY): 20 min   Charges:   PT Evaluation $PT Eval Moderate Complexity: 1 Mod          04/26/2021  Ginger Carne., PT Acute Rehabilitation Services [phone]  (pager) [phone]  (office)  Tessie Fass [place] Shannahan 04/26/2021, 6:14 PM

## 2021-04-27 DIAGNOSIS — I5043 Acute on chronic combined systolic (congestive) and diastolic (congestive) heart failure: Secondary | ICD-10-CM | POA: Diagnosis not present

## 2021-04-27 DIAGNOSIS — I110 Hypertensive heart disease with heart failure: Secondary | ICD-10-CM | POA: Diagnosis not present

## 2021-04-27 DIAGNOSIS — K4090 Unilateral inguinal hernia, without obstruction or gangrene, not specified as recurrent: Secondary | ICD-10-CM | POA: Diagnosis not present

## 2021-04-27 DIAGNOSIS — J432 Centrilobular emphysema: Secondary | ICD-10-CM | POA: Diagnosis not present

## 2021-04-27 DIAGNOSIS — I2510 Atherosclerotic heart disease of native coronary artery without angina pectoris: Secondary | ICD-10-CM | POA: Diagnosis not present

## 2021-04-27 DIAGNOSIS — J189 Pneumonia, unspecified organism: Secondary | ICD-10-CM | POA: Diagnosis not present

## 2021-04-27 DIAGNOSIS — I739 Peripheral vascular disease, unspecified: Secondary | ICD-10-CM | POA: Diagnosis not present

## 2021-04-27 DIAGNOSIS — I5042 Chronic combined systolic (congestive) and diastolic (congestive) heart failure: Secondary | ICD-10-CM | POA: Diagnosis not present

## 2021-04-27 LAB — BASIC METABOLIC PANEL
Anion gap: 9 (ref 5–15)
BUN: 17 mg/dL (ref 8–23)
CO2: 36 mmol/L — ABNORMAL HIGH (ref 22–32)
Calcium: 8.4 mg/dL — ABNORMAL LOW (ref 8.9–10.3)
Chloride: 96 mmol/L — ABNORMAL LOW (ref 98–111)
Creatinine, Ser: 0.9 mg/dL (ref 0.61–1.24)
GFR, Estimated: >60 mL/min (ref >60)
Glucose, Bld: 86 mg/dL (ref 70–99)
Potassium: 3.4 mmol/L — ABNORMAL LOW (ref 3.5–5.1)
Sodium: 141 mmol/L (ref 135–145)

## 2021-04-27 MED ORDER — POTASSIUM CHLORIDE CRYS ER 20 MEQ PO TBCR
40.0000 meq | EXTENDED_RELEASE_TABLET | Freq: Two times a day (BID) | ORAL | Status: AC
  Administered 2021-04-27 (×2): 40 meq via ORAL
  Filled 2021-04-27 (×2): qty 2

## 2021-04-27 MED ORDER — FUROSEMIDE 40 MG PO TABS
40.0000 mg | ORAL_TABLET | Freq: Two times a day (BID) | ORAL | Status: DC
  Administered 2021-04-28: 40 mg via ORAL
  Filled 2021-04-27: qty 1

## 2021-04-27 MED ORDER — FUROSEMIDE 10 MG/ML IJ SOLN
40.0000 mg | Freq: Two times a day (BID) | INTRAMUSCULAR | Status: AC
  Administered 2021-04-27: 40 mg via INTRAVENOUS
  Filled 2021-04-27: qty 4

## 2021-04-27 NOTE — Plan of Care (Signed)
°  Problem: Education: Goal: Ability to demonstrate management of disease process will improve Outcome: Progressing   Problem: Activity: Goal: Capacity to carry out activities will improve Outcome: Progressing   Problem: Activity: Goal: Ability to tolerate increased activity will improve Outcome: Progressing

## 2021-04-27 NOTE — Progress Notes (Signed)
PROGRESS NOTE    [name]  XLK:[id number] DOB: [birth year] DOA: 04/24/2021 PCP: Rusty Aus, MD  Brief Narrative: 75/M with history of chronic combined systolic and diastolic CHF, EF 36-64%, COPD/chronic respiratory failure on 2 L home O2, CAD, hypertension, dyslipidemia, tobacco abuse, AAA, pulmonary nodule presented to the ED with acute onset dyspnea, patient also noticed worsening lower extremity edema in the past few weeks, and orthopnea. -Also reported some productive cough, no fevers or chills -In the ED he was afebrile, O2 sats were 99% on nonrebreather mask, BNP was 3545, troponin was 372, COVID and flu PCR were negative, chest x-ray noted pulmonary vascular congestion with bilateral perihilar interstitial and airspace opacities, pulmonary nodule. -He was given Lasix, duo nebs, Solu-Medrol in ED and admitted   Assessment & Plan:   Acute on chronic systolic, diastolic CHF, dilated cardiomyopathy -previous echo w/ EF of 30-35% -Clinically improving with diuresis, 7.3 L negative -Transition to oral Lasix -Continue Entresto, carvedilol, started on Aldactone -Monitor I's/O, daily weights -Reviewed cardiology notes by Dr. Rockey Situ 01/19/2021, patient has only wanted conservative management, declined cath and defibrillator, excessive medications etc. -Discharge planning, home tomorrow if stable  Elevated troponin -With flat trend, clinically suspect demand ischemia -Patient denies any chest pain, no acute findings on EKG -Case was discussed with cardiology on admission, demand ischemia suspected, recommended to trend troponin, continue carvedilol, aspirin was discontinued following recent admission with intracranial hemorrhage -2D echocardiogram with EF of 30-35%, global hypokinesis noted-unchanged from prior  COPD, chronic respiratory failure  -On 2 L home O2, stable, continue nebs  -Antibiotics and steroids discontinued, clinically do not suspect infectious process   History of  CAD -See discussion above,  -History of BMS to circumflex in 2002 and BMS to LAD in 2012 -Continue carvedilol, aspirin discontinued following recent hospitalization with intracranial hemorrhage -Per cardiologist notes has declined cardiac cath -Recommend close follow-up with Dr. Rockey Situ  Chronic anemia -Hemoglobin stable, anemia panel with iron deficiency, add oral iron at discharge  History of pulmonary nodule -Needs follow-up   Tobacco abuse -Quit 5 weeks ago, declines nicotine patch  History of AAA -Status post aorto bi-iliac stent graft  History of intraparenchymal hemorrhage  DVT prophylaxis: SCDs, add lovenox Code Status: DNR Family Communication: Discussed patient in detail, no family at bedside Disposition Plan: Home tomorrow if stable Status is: Inpatient Inpatient.  Appropriate due to severity of illness  Consultants:  None, discussed with cardiology on admission  Procedures:   Antimicrobials:    Subjective: -Breathing improving overall, improved activity  Objective: Vitals:   04/26/21 1936 04/26/21 1953 04/27/21 0441 04/27/21 0734  BP:  112/70 (!) 143/65   Pulse:  62 (!) 59   Resp:  18 17   Temp:  97.9 F (36.6 C) 97.7 F (36.5 C)   TempSrc:  Oral Oral   SpO2: 97% 93% 99% 90%  Weight:   68.4 kg   Height:        Intake/Output Summary (Last 24 hours) at 04/27/2021 1144 Last data filed at 04/27/2021 1100 Gross per 24 hour  Intake 1080 ml  Output 3025 ml  Net -1945 ml   Filed Weights   04/25/21 0525 04/26/21 0339 04/27/21 0441  Weight: 75.5 kg 71.8 kg 68.4 kg    Examination:  General exam: Chronically ill male sitting up in bed, AAOx3, no distress HEENT: Positive JVD CVS: S1-S2, regular rate rhythm Lungs: Improving air movement, few basilar rales Abdomen: Soft, nontender, bowel sounds present Extremities: 1+ edema bilaterally, compression  stockings on Skin: No rashes on exposed skin Psychiatry:  Mood & affect appropriate.   Data  Reviewed:   CBC: Recent Labs  Lab 04/24/21 0645 04/24/21 0653 04/24/21 0757 04/25/21 0419 04/26/21 0318  WBC 6.7  --   --  5.9 7.5  NEUTROABS 4.8  --   --   --   --   HGB 10.9* 11.6* 10.5* 11.2* 11.7*  HCT 35.3* 34.0* 31.0* 34.2* 36.5*  MCV 101.7*  --   --  98.0 96.8  PLT 138*  --   --  159 637   Basic Metabolic Panel: Recent Labs  Lab 04/24/21 0645 04/24/21 0653 04/24/21 0757 04/24/21 1143 04/25/21 0419 04/26/21 0318 04/27/21 0408  NA 140 143 143  --  139 140 141  K 3.9 3.9 3.6  --  4.0 3.9 3.4*  CL 102 100  --   --  99 98 96*  CO2 34*  --   --   --  34* 33* 36*  GLUCOSE 125* 118*  --   --  114* 94 86  BUN 11 13  --   --  16 20 17   CREATININE 0.88 0.80  --   --  0.87 0.86 0.90  CALCIUM 8.2*  --   --   --  8.3* 8.1* 8.4*  MG  --   --   --  1.5* 1.9  --   --    GFR: Estimated Creatinine Clearance: 66.5 mL/min (by C-G formula based on SCr of 0.9 mg/dL). Liver Function Tests: Recent Labs  Lab 04/24/21 0645  AST 20  ALT 23  ALKPHOS 67  BILITOT 1.2  PROT 5.4*  ALBUMIN 2.6*   No results for input(s): LIPASE, AMYLASE in the last 168 hours. No results for input(s): AMMONIA in the last 168 hours. Coagulation Profile: No results for input(s): INR, PROTIME in the last 168 hours. Cardiac Enzymes: No results for input(s): CKTOTAL, CKMB, CKMBINDEX, TROPONINI in the last 168 hours. BNP (last 3 results) No results for input(s): PROBNP in the last 8760 hours. HbA1C: No results for input(s): HGBA1C in the last 72 hours. CBG: No results for input(s): GLUCAP in the last 168 hours. Lipid Profile: No results for input(s): CHOL, HDL, LDLCALC, TRIG, CHOLHDL, LDLDIRECT in the last 72 hours. Thyroid Function Tests: No results for input(s): TSH, T4TOTAL, FREET4, T3FREE, THYROIDAB in the last 72 hours. Anemia Panel: Recent Labs    04/24/21 1227  FOLATE 13.2   Urine analysis:    Component Value Date/Time   COLORURINE YELLOW (A) 02/21/2021 0641   APPEARANCEUR HAZY (A)  02/21/2021 0641   APPEARANCEUR Cloudy 04/21/2011 1418   LABSPEC 1.021 02/21/2021 0641   LABSPEC 1.013 04/21/2011 1418   PHURINE 5.0 02/21/2021 0641   GLUCOSEU NEGATIVE 02/21/2021 0641   GLUCOSEU Negative 04/21/2011 1418   HGBUR MODERATE (A) 02/21/2021 0641   BILIRUBINUR NEGATIVE 02/21/2021 0641   BILIRUBINUR Negative 04/21/2011 1418   KETONESUR 5 (A) 02/21/2021 0641   PROTEINUR 30 (A) 02/21/2021 0641   NITRITE POSITIVE (A) 02/21/2021 0641   LEUKOCYTESUR MODERATE (A) 02/21/2021 0641   LEUKOCYTESUR Negative 04/21/2011 1418   Sepsis Labs: @LABRCNTIP (procalcitonin:4,lacticidven:4)  ) Recent Results (from the past 240 hour(s))  Resp Panel by RT-PCR (Flu A&B, Covid) Nasopharyngeal Swab     Status: None   Collection Time: 04/24/21  6:56 AM   Specimen: Nasopharyngeal Swab; Nasopharyngeal(NP) swabs in vial transport medium  Result Value Ref Range Status   SARS Coronavirus 2 by RT PCR NEGATIVE NEGATIVE  Final    Comment: (NOTE) SARS-CoV-2 target nucleic acids are NOT DETECTED.  The SARS-CoV-2 RNA is generally detectable in upper respiratory specimens during the acute phase of infection. The lowest concentration of SARS-CoV-2 viral copies this assay can detect is 138 copies/mL. A negative result does not preclude SARS-Cov-2 infection and should not be used as the sole basis for treatment or other patient management decisions. A negative result may occur with  improper specimen collection/handling, submission of specimen other than nasopharyngeal swab, presence of viral mutation(s) within the areas targeted by this assay, and inadequate number of viral copies(<138 copies/mL). A negative result must be combined with clinical observations, patient history, and epidemiological information. The expected result is Negative.  Fact Sheet for Patients:  EntrepreneurPulse.com.au  Fact Sheet for Healthcare Providers:  IncredibleEmployment.be  This test is  no t yet approved or cleared by the [place] FDA and  has been authorized for detection and/or diagnosis of SARS-CoV-2 by FDA under an Emergency Use Authorization (EUA). This EUA will remain  in effect (meaning this test can be used) for the duration of the COVID-19 declaration under Section 564(b)(1) of the Act, 21 U.S.C.section 360bbb-3(b)(1), unless the authorization is terminated  or revoked sooner.       Influenza A by PCR NEGATIVE NEGATIVE Final   Influenza B by PCR NEGATIVE NEGATIVE Final    Comment: (NOTE) The Xpert Xpress SARS-CoV-2/FLU/RSV plus assay is intended as an aid in the diagnosis of influenza from Nasopharyngeal swab specimens and should not be used as a sole basis for treatment. Nasal washings and aspirates are unacceptable for Xpert Xpress SARS-CoV-2/FLU/RSV testing.  Fact Sheet for Patients: EntrepreneurPulse.com.au  Fact Sheet for Healthcare Providers: IncredibleEmployment.be  This test is not yet approved or cleared by the [place] FDA and has been authorized for detection and/or diagnosis of SARS-CoV-2 by FDA under an Emergency Use Authorization (EUA). This EUA will remain in effect (meaning this test can be used) for the duration of the COVID-19 declaration under Section 564(b)(1) of the Act, 21 U.S.C. section 360bbb-3(b)(1), unless the authorization is terminated or revoked.  Performed at [facility], [place] [address], [place], [place] [address]   Respiratory (~20 pathogens) panel by PCR     Status: None   Collection Time: 04/24/21  6:56 AM   Specimen: Nasopharyngeal Swab; Respiratory  Result Value Ref Range Status   Adenovirus NOT DETECTED NOT DETECTED Final   Coronavirus 229E NOT DETECTED NOT DETECTED Final    Comment: (NOTE) The Coronavirus on the Respiratory Panel, DOES NOT test for the novel  Coronavirus (2019 nCoV)    Coronavirus HKU1 NOT DETECTED NOT DETECTED Final   Coronavirus NL63 NOT  DETECTED NOT DETECTED Final   Coronavirus OC43 NOT DETECTED NOT DETECTED Final   Metapneumovirus NOT DETECTED NOT DETECTED Final   Rhinovirus / Enterovirus NOT DETECTED NOT DETECTED Final   Influenza A NOT DETECTED NOT DETECTED Final   Influenza B NOT DETECTED NOT DETECTED Final   Parainfluenza Virus 1 NOT DETECTED NOT DETECTED Final   Parainfluenza Virus 2 NOT DETECTED NOT DETECTED Final   Parainfluenza Virus 3 NOT DETECTED NOT DETECTED Final   Parainfluenza Virus 4 NOT DETECTED NOT DETECTED Final   Respiratory Syncytial Virus NOT DETECTED NOT DETECTED Final   Bordetella pertussis NOT DETECTED NOT DETECTED Final   Bordetella Parapertussis NOT DETECTED NOT DETECTED Final   Chlamydophila pneumoniae NOT DETECTED NOT DETECTED Final   Mycoplasma pneumoniae NOT DETECTED NOT DETECTED Final  Comment: Performed at [facility], [place] [address], [place], [place] [address]     Scheduled Meds:  atorvastatin  80 mg Oral Daily   carvedilol  3.125 mg Oral BID   furosemide  40 mg Intravenous Q12H   [START ON 04/28/2021] furosemide  40 mg Oral BID   mometasone-formoterol  2 puff Inhalation BID   pantoprazole  80 mg Oral Daily   potassium chloride  40 mEq Oral BID   sacubitril-valsartan  1 tablet Oral BID   sodium chloride flush  3 mL Intravenous Q12H   spironolactone  12.5 mg Oral Daily   Continuous Infusions:  sodium chloride       LOS: 3 days    Time spent: 4min   Domenic Polite, MD [facility]   04/27/2021, 11:44 AM

## 2021-04-27 NOTE — TOC Transition Note (Addendum)
Transition of Care [facility]) - CM/SW Discharge Note   Patient Details  Name: [name] MRN: [id number] Date of Birth: 12-09-1945  Transition of Care [facility] CM/SW Contact:  Zenon Mayo, RN Phone Number: 04/27/2021, 10:20 AM   Clinical Narrative:    NCM offered choice to patient for [facility] for disease management and HHPT. He states he does not have a preference.  NCM made referral to Amy with [facility].  She states she will send it in to see if they can cover it.  Awaiting call back.  Per Amy with [facility], they are able to take referral.  Soc will begin 24 to 48 hrs post dc.     Final next level of care: [place] Barriers to Discharge: Continued Medical Work up   Patient Goals and CMS Choice Patient states their goals for this hospitalization and ongoing recovery are:: return home CMS Medicare.gov Compare Post Acute Care list provided to:: Patient Choice offered to / list presented to : Patient  Discharge Placement                       Discharge Plan and Services                  DME Agency: NA       HH Arranged: RN, Disease Management, PT [place] Agency: [place] Date [facility] Agency Contacted: 04/27/21 Time [place]: 23 Representative spoke with at [place]: Amy  Social Determinants of Health ([place]) Interventions     Readmission Risk Interventions No flowsheet data found.

## 2021-04-27 NOTE — Care Management Important Message (Signed)
Important Message  Patient Details  Name: [name] MRN: [id number] Date of Birth: 09-22-1945   Medicare Important Message Given:  Yes     Shelda Altes 04/27/2021, 9:50 AM

## 2021-04-27 NOTE — Progress Notes (Signed)
Mobility Specialist Progress Note:   04/27/21 1308  Mobility  Activity Ambulated in hall  Level of Assistance Modified independent, requires aide device or extra time  Assistive Device Front wheel walker  Distance Ambulated (ft) 300 ft  Mobility Ambulated with assistance in hallway  Mobility Response Tolerated well  Mobility performed by Mobility specialist  $Mobility charge 1 Mobility    Pre- Mobility:  66 HR During Mobility: 76 HR Post Mobility:   68 HR  Pt received bed willing to participate in mobility. No complaints of pain and asymptomatic. Pt left EOB with call bell in reach and all needs met.   [facility] Public librarian Phone [phone] Secondary Phone [phone]

## 2021-04-28 DIAGNOSIS — I5043 Acute on chronic combined systolic (congestive) and diastolic (congestive) heart failure: Secondary | ICD-10-CM | POA: Diagnosis not present

## 2021-04-28 DIAGNOSIS — R911 Solitary pulmonary nodule: Secondary | ICD-10-CM | POA: Diagnosis not present

## 2021-04-28 DIAGNOSIS — J449 Chronic obstructive pulmonary disease, unspecified: Secondary | ICD-10-CM

## 2021-04-28 LAB — BASIC METABOLIC PANEL
Anion gap: 7 (ref 5–15)
BUN: 21 mg/dL (ref 8–23)
CO2: 34 mmol/L — ABNORMAL HIGH (ref 22–32)
Calcium: 8.1 mg/dL — ABNORMAL LOW (ref 8.9–10.3)
Chloride: 99 mmol/L (ref 98–111)
Creatinine, Ser: 0.99 mg/dL (ref 0.61–1.24)
GFR, Estimated: >60 mL/min (ref >60)
Glucose, Bld: 92 mg/dL (ref 70–99)
Potassium: 4 mmol/L (ref 3.5–5.1)
Sodium: 140 mmol/L (ref 135–145)

## 2021-04-28 MED ORDER — SPIRONOLACTONE 25 MG PO TABS: 12.5000 mg | ORAL_TABLET | Freq: Every day | ORAL | 0 refills | Status: DC

## 2021-04-28 MED ORDER — ACETAMINOPHEN 325 MG PO TABS: 650.0000 mg | ORAL_TABLET | Freq: Four times a day (QID) | ORAL | Status: AC | PRN

## 2021-04-28 NOTE — Progress Notes (Signed)
Mobility Specialist Progress Note:   04/28/21 1017  Mobility  Activity Ambulated in hall  Level of [place] wheel walker  Distance Ambulated (ft) 300 ft  Mobility Ambulated with assistance in hallway  Mobility Response Tolerated well  Mobility performed by Mobility specialist  $Mobility charge 1 Mobility   Pt received EOB willing to participate in mobility. No complaints of pain and asymptomatic. Pt left EOB with call bell in reach and all needs met.   [facility] Public librarian Phone [phone] Secondary Phone [phone]

## 2021-04-28 NOTE — Plan of Care (Signed)
°  Problem: Education: Goal: Ability to demonstrate management of disease process will improve Outcome: Adequate for Discharge Goal: Ability to verbalize understanding of medication therapies will improve Outcome: Adequate for Discharge Goal: Individualized Educational Video(s) Outcome: Adequate for Discharge   Problem: Activity: Goal: Capacity to carry out activities will improve Outcome: Adequate for Discharge   Problem: Education: Goal: Knowledge of disease or condition will improve Outcome: Adequate for Discharge Goal: Knowledge of the prescribed therapeutic regimen will improve Outcome: Adequate for Discharge Goal: Individualized Educational Video(s) Outcome: Adequate for Discharge   Problem: Activity: Goal: Ability to tolerate increased activity will improve Outcome: Adequate for Discharge Goal: Will verbalize the importance of balancing activity with adequate rest periods Outcome: Adequate for Discharge   Problem: Respiratory: Goal: Ability to maintain a clear airway will improve Outcome: Adequate for Discharge Goal: Levels of oxygenation will improve Outcome: Adequate for Discharge Goal: Ability to maintain adequate ventilation will improve Outcome: Adequate for Discharge   Problem: Acute Rehab PT Goals(only PT should resolve) Goal: Pt Will Go Supine/Side To Sit Outcome: Adequate for Discharge Goal: Pt Will Go Sit To Supine/Side Outcome: Adequate for Discharge Goal: Patient Will Transfer Sit To/From Stand Outcome: Adequate for Discharge Goal: Pt Will Transfer Bed To Chair/Chair To Bed Outcome: Adequate for Discharge Goal: Pt Will Ambulate Outcome: Adequate for Discharge

## 2021-04-28 NOTE — Discharge Summary (Addendum)
Physician Discharge Summary  DIANE MOCHIZUKI VPX:[id number] DOB: 02-Mar-1946 DOA: 04/24/2021  PCP: Rusty Aus, MD  Admit date: 04/24/2021 Discharge date: 04/28/2021  Admitted From: Home Disposition:  Home   Recommendations for Outpatient Follow-up:  Follow up with PCP in 1-2 weeks Please obtain BMP/CBC in one week Patient will need outpatient follow-up for right upper lobe pulmonary nodule not evidence on previous imaging, nonemergent noncontrast enhanced CT scan chest can be done once patient has resolution of his symptoms.  Home Health:YES   Discharge Condition:Stable CODE STATUS:DNR Diet recommendation: Heart Healthy  Brief/Interim Summary:  76/M with history of chronic combined systolic and diastolic CHF, EF 46-27%, COPD/chronic respiratory failure on 2 L home O2, CAD, hypertension, dyslipidemia, tobacco abuse, AAA, pulmonary nodule presented to the ED with acute onset dyspnea, patient also noticed worsening lower extremity edema in the past few weeks, and orthopnea. -Also reported some productive cough, no fevers or chills -In the ED he was afebrile, O2 sats were 99% on nonrebreather mask, BNP was 3545, troponin was 372, COVID and flu PCR were negative, chest x-ray noted pulmonary vascular congestion with bilateral perihilar interstitial and airspace opacities, pulmonary nodule. -He was given Lasix, duo nebs, Solu-Medrol in ED and admitted  Acute on chronic systolic, diastolic CHF, dilated cardiomyopathy -previous echo w/ EF of 30-35%, he was admitted for IV diuresis, he diuresed very very well, -7.9 L during hospital stay, he is much better today, he will be transitioned on home dose Lasix (I will hold on increasing it especially now her blood pressure is soft), -Continue Entresto, carvedilol, started on Aldactone -Reviewed cardiology notes by Dr. Rockey Situ 01/19/2021, patient has only wanted conservative management, declined cath and defibrillator, excessive medications  etc. -Discharge home with home health including RN   Elevated troponin -With flat trend, clinically suspect demand ischemia -Patient denies any chest pain, no acute findings on EKG -Case was discussed with cardiology on admission, demand ischemia suspected, recommended to trend troponin, continue carvedilol, aspirin was discontinued following recent admission with intracranial hemorrhage -2D echocardiogram with EF of 30-35%, global hypokinesis noted-unchanged from prior   COPD, chronic respiratory failure  -On 2 L home O2, stable, continue nebs  -Antibiotics and steroids discontinued, clinically do not suspect infectious process    History of CAD -See discussion above,  -History of BMS to circumflex in 2002 and BMS to LAD in 2012 -Continue carvedilol, aspirin discontinued following recent hospitalization with intracranial hemorrhage -Per cardiologist notes has declined cardiac cath -Recommend close follow-up with Dr. Rockey Situ   Chronic anemia -Hemoglobin stable, anemia panel with iron deficiency, add oral iron at discharge   History of pulmonary nodule - Patient will need outpatient follow-up for right upper lobe pulmonary nodule not evidence on previous imaging, nonemergent noncontrast enhanced CT scan chest can be done once patient has resolution of his symptoms.  Tobacco abuse -Quit 5 weeks ago, declines nicotine patch   History of AAA -Status post aorto bi-iliac stent graft   History of intraparenchymal hemorrhage  Discharge Diagnoses:  Principal Problem:   Acute on chronic combined systolic and diastolic CHF (congestive heart failure) (HCC) Active Problems:   Hyperlipidemia   Essential hypertension   CAD, NATIVE VESSEL/HLD   GERD   Tobacco abuse   Pulmonary nodule   COPD exacerbation (HCC)   AAA (abdominal aortic aneurysm) without rupture   Elevated troponin   Intraparenchymal hemorrhage of brain (HCC)   Anemia   Acute on chronic respiratory failure with hypoxia  [facility])    Discharge  Instructions  Discharge Instructions     Diet - low sodium heart healthy   Complete by: As directed    Increase activity slowly   Complete by: As directed       Allergies as of 04/28/2021       Reactions   Lyrica [pregabalin] Swelling      Prednisone Other (See Comments)   Feels like having a heart attack. Has had the injection form before and does well. Oral Steroids        Medication List     STOP taking these medications    oxyCODONE 5 MG immediate release tablet Commonly known as: Oxy IR/ROXICODONE       TAKE these medications    acetaminophen 325 MG tablet Commonly known as: TYLENOL Take 2 tablets (650 mg total) by mouth every 6 (six) hours as needed for mild pain (or Fever >/= 101). What changed:  medication strength how much to take reasons to take this   albuterol 108 (90 Base) MCG/ACT inhaler Commonly known as: VENTOLIN HFA Inhale 2 puffs into the lungs every 6 (six) hours as needed for wheezing.   atorvastatin 80 MG tablet Commonly known as: LIPITOR Take 1 tablet (80 mg total) by mouth daily.   budesonide-formoterol 160-4.5 MCG/ACT inhaler Commonly known as: Symbicort Inhale 2 puffs into the lungs 2 (two) times daily.   carvedilol 3.125 MG tablet Commonly known as: COREG Take 1 tablet (3.125 mg total) by mouth 2 (two) times daily.   Entresto 97-103 MG Generic drug: sacubitril-valsartan Take 1 tablet by mouth 2 (two) times daily.   furosemide 40 MG tablet Commonly known as: LASIX Take 40 mg by mouth daily.   multivitamin with minerals tablet Take 1 tablet by mouth daily.   nicotine 21 mg/24hr patch Commonly known as: NICODERM CQ - dosed in mg/24 hours Place 1 patch (21 mg total) onto the skin daily.   omeprazole 40 MG capsule Commonly known as: PRILOSEC TAKE 1 CAPSULE BY MOUTH DAILY USUALLY 30MINUTES BEFORE BREAKFAST What changed:  how much to take how to take this when to take this additional  instructions   senna-docusate 8.6-50 MG tablet Commonly known as: Senokot-S Take 2 tablets by mouth at bedtime.   spironolactone 25 MG tablet Commonly known as: ALDACTONE Take 0.5 tablets (12.5 mg total) by mouth daily. FOR CHRONIC COMBINED CHF What changed: how much to take   valproic acid 250 MG capsule Commonly known as: DEPAKENE Take 1 capsule (250 mg total) by mouth 2 (two) times daily.        Follow-up Information     [facility].   Why: new name is [facility],  HHRN,HHPT. Contact information: [place]  [place]        Rusty Aus, MD Follow up in 1 week(s).   Specialty: Internal Medicine Contact information: [place] [address] [phone]                Allergies  Allergen Reactions   Lyrica [Pregabalin] Swelling        Prednisone Other (See Comments)    Feels like having a heart attack. Has had the injection form before and does well. Oral Steroids    Consultations: None   Procedures/Studies: DG Chest Port 1 View  Result Date: 04/24/2021 CLINICAL DATA:  Shortness of breath EXAM: PORTABLE CHEST 1 VIEW COMPARISON:  Prior chest x-ray 03/18/2021 FINDINGS: Cardiomegaly.  Increased pulmonary vascular congestion with perihilar and bibasilar airspace infiltrates. No large effusion. No pneumothorax. Atherosclerotic calcifications present in the transverse aorta. Questionable 7-8 mm nodule in the right upper lobe. IMPRESSION: 1. Increased pulmonary vascular congestion with bilateral perihilar and bibasilar interstitial and airspace opacities. Overall, the imaging appearance is most suggestive of CHF. However, given relatively dependent distribution aspiration is a consideration in the appropriate clinical context. 2. Questionable 7-8 mm right upper lobe pulmonary nodule not evident on prior imaging. Consider follow-up  with noncontrast enhanced CT scan of the chest following resolution of the patient's acute symptoms. Electronically Signed   By: Jacqulynn Cadet M.D.   On: 04/24/2021 06:43   ECHOCARDIOGRAM COMPLETE  Result Date: 04/25/2021    ECHOCARDIOGRAM REPORT   Patient Name:   [name] Date of Exam: 04/25/2021 Medical Rec #:  [id number]      Height:       72.0 in Accession #:    [id number]     Weight:       166.4 lb Date of Birth:  Aug 25, 1945      BSA:          1.970 m Patient Age:    35 years       BP:           155/69 mmHg Patient Gender: M              HR:           75 bpm. Exam Location:  Inpatient Procedure: 2D Echo, Cardiac Doppler and Color Doppler Indications:     CHF/ ELEV. TROPONIN  History:         Patient has prior history of Echocardiogram examinations, most                  recent 02/21/2021. CAD and Previous Myocardial Infarction, COPD                  and Stroke; Risk Factors:Hypertension and Dyslipidemia. GERD.  Sonographer:     Beryle Beams Referring Phys:  [id number] Orma Flaming Diagnosing Phys: Oswaldo Milian MD IMPRESSIONS  1. Left ventricular ejection fraction, by estimation, is 30 to 35%. The left ventricle has moderately decreased function. The left ventricle demonstrates global hypokinesis. There is mild left ventricular hypertrophy. Left ventricular diastolic parameters are consistent with Grade I diastolic dysfunction (impaired relaxation).  2. Right ventricular systolic function is normal. The right ventricular size is mildly enlarged. There is moderately elevated pulmonary artery systolic pressure. The estimated right ventricular systolic pressure is 28.0 mmHg.  3. Left atrial size was mildly dilated.  4. Right atrial size was severely dilated.  5. The mitral valve is normal in structure. Mild mitral valve regurgitation.  6. The aortic valve was not well visualized. Aortic valve regurgitation is not visualized. No aortic stenosis is present.  7. The inferior vena cava is dilated in  size with >50% respiratory variability, suggesting right atrial pressure of 8 mmHg. FINDINGS  Left Ventricle: Left ventricular ejection fraction, by estimation, is 30 to 35%. The left ventricle has moderately decreased function. The left ventricle demonstrates global hypokinesis. The left ventricular internal cavity size was normal in size. There is mild left ventricular hypertrophy. Left ventricular diastolic parameters are consistent with Grade I diastolic dysfunction (impaired relaxation). Right Ventricle: The right ventricular size is mildly enlarged. No increase in right ventricular wall thickness. Right ventricular systolic function is normal. There is moderately elevated pulmonary artery systolic pressure. The tricuspid regurgitant velocity  is 3.41 m/s, and with an assumed right atrial pressure of 8 mmHg, the estimated right ventricular systolic pressure is 41.9 mmHg. Left Atrium: Left atrial size was mildly dilated. Right Atrium: Right atrial size was severely dilated. Pericardium: There is no evidence of pericardial effusion. Mitral Valve: The mitral valve is normal in structure. Mild mitral valve regurgitation. MV peak gradient, 87.2 mmHg. The mean mitral valve gradient is 58.0 mmHg. Tricuspid Valve: The tricuspid valve is normal in structure. Tricuspid valve regurgitation is mild. Aortic Valve: The aortic valve was not well visualized. Aortic valve regurgitation is not visualized. No aortic stenosis is present. Aortic valve mean gradient measures 5.0 mmHg. Aortic valve peak gradient measures 9.4 mmHg. Aortic valve area, by VTI measures 2.64 cm. Pulmonic Valve: The pulmonic valve was not well visualized. Pulmonic valve regurgitation is not visualized. Aorta: The aortic root and ascending aorta are structurally normal, with no evidence of dilitation. Venous: The inferior vena cava is dilated in size with greater than 50% respiratory variability, suggesting right atrial pressure of 8 mmHg. IAS/Shunts: The  interatrial septum was not well visualized.  LEFT VENTRICLE PLAX 2D LVIDd:         5.60 cm      Diastology LVIDs:         3.90 cm      LV e' medial:    6.42 cm/s LV PW:         1.30 cm      LV E/e' medial:  13.2 LV IVS:        1.40 cm      LV e' lateral:   7.29 cm/s LVOT diam:     2.20 cm      LV E/e' lateral: 11.6 LV SV:         89 LV SV Index:   45 LVOT Area:     3.80 cm  LV Volumes (MOD) LV vol d, MOD A2C: 177.0 ml LV vol d, MOD A4C: 219.0 ml LV vol s, MOD A2C: 123.0 ml LV vol s, MOD A4C: 150.0 ml LV SV MOD A2C:     54.0 ml LV SV MOD A4C:     219.0 ml LV SV MOD BP:      58.0 ml RIGHT VENTRICLE             IVC RV S prime:     14.30 cm/s  IVC diam: 2.00 cm TAPSE (M-mode): 3.0 cm LEFT ATRIUM             Index        RIGHT ATRIUM           Index LA diam:        4.10 cm 2.08 cm/m   RA Area:     26.70 cm LA Vol (A2C):   75.5 ml 38.32 ml/m  RA Volume:   101.00 ml 51.26 ml/m LA Vol (A4C):   72.4 ml 36.74 ml/m LA Biplane Vol: 74.5 ml 37.81 ml/m  AORTIC VALVE                     PULMONIC VALVE AV Area (Vmax):    2.71 cm      PV Vmax:       0.67 m/s AV Area (Vmean):   2.51 cm      PV Vmean:      48.000 cm/s AV Area (VTI):     2.64 cm      PV VTI:  0.182 m AV Vmax:           153.00 cm/s   PV Peak grad:  1.8 mmHg AV Vmean:          103.000 cm/s  PV Mean grad:  1.0 mmHg AV VTI:            0.337 m AV Peak Grad:      9.4 mmHg AV Mean Grad:      5.0 mmHg LVOT Vmax:         109.00 cm/s LVOT Vmean:        68.100 cm/s LVOT VTI:          0.234 m LVOT/AV VTI ratio: 0.69  AORTA Ao Root diam: 2.50 cm Ao Asc diam:  2.80 cm MITRAL VALVE                TRICUSPID VALVE MV Area (PHT): 3.74 cm     TV Peak grad:   40.7 mmHg MV Area VTI:   0.51 cm     TV Mean grad:   28.0 mmHg MV Peak grad:  87.2 mmHg    TV Vmax:        3.19 m/s MV Mean grad:  58.0 mmHg    TV Vmean:       248.0 cm/s MV Vmax:       4.67 m/s     TV VTI:         1.14 msec MV Vmean:      362.0 cm/s   TR Peak grad:   46.5 mmHg MV Decel Time: 203 msec     TR Vmax:         341.00 cm/s MV E velocity: 84.80 cm/s MV A velocity: 115.00 cm/s  SHUNTS MV E/A ratio:  0.74         Systemic VTI:  0.23 m                             Systemic Diam: 2.20 cm Oswaldo Milian MD Electronically signed by Oswaldo Milian MD Signature Date/Time: 04/25/2021/10:31:57 AM    Final (Updated)       Subjective: Patient reports he is feeling much better today, dyspnea has improved, he denies any cough or chest pain.  Discharge Exam: Vitals:   04/28/21 0547 04/28/21 0818  BP: 125/69   Pulse: (!) 59   Resp: 19   Temp: 98.3 F (36.8 C)   SpO2: 95% 97%   Vitals:   04/27/21 2001 04/27/21 2011 04/28/21 0547 04/28/21 0818  BP: (!) 97/50 (!) 106/52 125/69   Pulse: 64 (!) 58 (!) 59   Resp: 20 20 19    Temp: 97.6 F (36.4 C)  98.3 F (36.8 C)   TempSrc: Oral  Oral   SpO2: 90% 92% 95% 97%  Weight:   67.8 kg   Height:        General: Pt is alert, awake, not in acute distress Cardiovascular: RRR, S1/S2 +, no rubs, no gallops, trace edema/compression stocking on Respiratory: Few basilar rales, no wheezing, no rhonchi Abdominal: Soft, NT, ND, bowel sounds + Extremities: no edema, no cyanosis    The results of significant diagnostics from this hospitalization (including imaging, microbiology, ancillary and laboratory) are listed below for reference.     Microbiology: Recent Results (from the past 240 hour(s))  Resp Panel by RT-PCR (Flu A&B, Covid) Nasopharyngeal Swab     Status: None   Collection Time: 04/24/21  6:56 AM   Specimen: Nasopharyngeal Swab; Nasopharyngeal(NP) swabs in vial transport medium  Result Value Ref Range Status   SARS Coronavirus 2 by RT PCR NEGATIVE NEGATIVE Final    Comment: (NOTE) SARS-CoV-2 target nucleic acids are NOT DETECTED.  The SARS-CoV-2 RNA is generally detectable in upper respiratory specimens during the acute phase of infection. The lowest concentration of SARS-CoV-2 viral copies this assay can detect is 138 copies/mL. A  negative result does not preclude SARS-Cov-2 infection and should not be used as the sole basis for treatment or other patient management decisions. A negative result may occur with  improper specimen collection/handling, submission of specimen other than nasopharyngeal swab, presence of viral mutation(s) within the areas targeted by this assay, and inadequate number of viral copies(<138 copies/mL). A negative result must be combined with clinical observations, patient history, and epidemiological information. The expected result is Negative.  Fact Sheet for Patients:  EntrepreneurPulse.com.au  Fact Sheet for Healthcare Providers:  IncredibleEmployment.be  This test is no t yet approved or cleared by the [place] FDA and  has been authorized for detection and/or diagnosis of SARS-CoV-2 by FDA under an Emergency Use Authorization (EUA). This EUA will remain  in effect (meaning this test can be used) for the duration of the COVID-19 declaration under Section 564(b)(1) of the Act, 21 U.S.C.section 360bbb-3(b)(1), unless the authorization is terminated  or revoked sooner.       Influenza A by PCR NEGATIVE NEGATIVE Final   Influenza B by PCR NEGATIVE NEGATIVE Final    Comment: (NOTE) The Xpert Xpress SARS-CoV-2/FLU/RSV plus assay is intended as an aid in the diagnosis of influenza from Nasopharyngeal swab specimens and should not be used as a sole basis for treatment. Nasal washings and aspirates are unacceptable for Xpert Xpress SARS-CoV-2/FLU/RSV testing.  Fact Sheet for Patients: EntrepreneurPulse.com.au  Fact Sheet for Healthcare Providers: IncredibleEmployment.be  This test is not yet approved or cleared by the [place] FDA and has been authorized for detection and/or diagnosis of SARS-CoV-2 by FDA under an Emergency Use Authorization (EUA). This EUA will remain in effect (meaning this test can  be used) for the duration of the COVID-19 declaration under Section 564(b)(1) of the Act, 21 U.S.C. section 360bbb-3(b)(1), unless the authorization is terminated or revoked.  Performed at [facility], [place] [address], [place], [place] [address]   Respiratory (~20 pathogens) panel by PCR     Status: None   Collection Time: 04/24/21  6:56 AM   Specimen: Nasopharyngeal Swab; Respiratory  Result Value Ref Range Status   Adenovirus NOT DETECTED NOT DETECTED Final   Coronavirus 229E NOT DETECTED NOT DETECTED Final    Comment: (NOTE) The Coronavirus on the Respiratory Panel, DOES NOT test for the novel  Coronavirus (2019 nCoV)    Coronavirus HKU1 NOT DETECTED NOT DETECTED Final   Coronavirus NL63 NOT DETECTED NOT DETECTED Final   Coronavirus OC43 NOT DETECTED NOT DETECTED Final   Metapneumovirus NOT DETECTED NOT DETECTED Final   Rhinovirus / Enterovirus NOT DETECTED NOT DETECTED Final   Influenza A NOT DETECTED NOT DETECTED Final   Influenza B NOT DETECTED NOT DETECTED Final   Parainfluenza Virus 1 NOT DETECTED NOT DETECTED Final   Parainfluenza Virus 2 NOT DETECTED NOT DETECTED Final   Parainfluenza Virus 3 NOT DETECTED NOT DETECTED Final   Parainfluenza Virus 4 NOT DETECTED NOT DETECTED Final   Respiratory Syncytial Virus NOT DETECTED NOT DETECTED Final   Bordetella pertussis NOT DETECTED NOT DETECTED Final  Bordetella Parapertussis NOT DETECTED NOT DETECTED Final   Chlamydophila pneumoniae NOT DETECTED NOT DETECTED Final   Mycoplasma pneumoniae NOT DETECTED NOT DETECTED Final    Comment: Performed at [facility], [place] [address], [place], [place] [address]     Labs: BNP (last 3 results) Recent Labs    02/21/21 0503 04/24/21 0615  BNP 1,729.8* 1,962.2*   Basic Metabolic Panel: Recent Labs  Lab 04/24/21 0645 04/24/21 2979 04/24/21 0757 04/24/21 1143 04/25/21 0419 04/26/21 0318 04/27/21 0408 04/28/21 0338  NA 140 143 143  --  139 140 141 140  K 3.9 3.9  3.6  --  4.0 3.9 3.4* 4.0  CL 102 100  --   --  99 98 96* 99  CO2 34*  --   --   --  34* 33* 36* 34*  GLUCOSE 125* 118*  --   --  114* 94 86 92  BUN 11 13  --   --  16 20 17 21   CREATININE 0.88 0.80  --   --  0.87 0.86 0.90 0.99  CALCIUM 8.2*  --   --   --  8.3* 8.1* 8.4* 8.1*  MG  --   --   --  1.5* 1.9  --   --   --    Liver Function Tests: Recent Labs  Lab 04/24/21 0645  AST 20  ALT 23  ALKPHOS 67  BILITOT 1.2  PROT 5.4*  ALBUMIN 2.6*   No results for input(s): LIPASE, AMYLASE in the last 168 hours. No results for input(s): AMMONIA in the last 168 hours. CBC: Recent Labs  Lab 04/24/21 0645 04/24/21 0653 04/24/21 0757 04/25/21 0419 04/26/21 0318  WBC 6.7  --   --  5.9 7.5  NEUTROABS 4.8  --   --   --   --   HGB 10.9* 11.6* 10.5* 11.2* 11.7*  HCT 35.3* 34.0* 31.0* 34.2* 36.5*  MCV 101.7*  --   --  98.0 96.8  PLT 138*  --   --  159 170   Cardiac Enzymes: No results for input(s): CKTOTAL, CKMB, CKMBINDEX, TROPONINI in the last 168 hours. BNP: Invalid input(s): POCBNP CBG: No results for input(s): GLUCAP in the last 168 hours. D-Dimer No results for input(s): DDIMER in the last 72 hours. Hgb A1c No results for input(s): HGBA1C in the last 72 hours. Lipid Profile No results for input(s): CHOL, HDL, LDLCALC, TRIG, CHOLHDL, LDLDIRECT in the last 72 hours. Thyroid function studies No results for input(s): TSH, T4TOTAL, T3FREE, THYROIDAB in the last 72 hours.  Invalid input(s): FREET3 Anemia work up No results for input(s): VITAMINB12, FOLATE, FERRITIN, TIBC, IRON, RETICCTPCT in the last 72 hours. Urinalysis    Component Value Date/Time   COLORURINE YELLOW (A) 02/21/2021 0641   APPEARANCEUR HAZY (A) 02/21/2021 0641   APPEARANCEUR Cloudy 04/21/2011 1418   LABSPEC 1.021 02/21/2021 0641   LABSPEC 1.013 04/21/2011 1418   PHURINE 5.0 02/21/2021 0641   GLUCOSEU NEGATIVE 02/21/2021 0641   GLUCOSEU Negative 04/21/2011 1418   HGBUR MODERATE (A) 02/21/2021 0641    BILIRUBINUR NEGATIVE 02/21/2021 0641   BILIRUBINUR Negative 04/21/2011 1418   KETONESUR 5 (A) 02/21/2021 0641   PROTEINUR 30 (A) 02/21/2021 0641   NITRITE POSITIVE (A) 02/21/2021 0641   LEUKOCYTESUR MODERATE (A) 02/21/2021 0641   LEUKOCYTESUR Negative 04/21/2011 1418   Sepsis Labs Invalid input(s): PROCALCITONIN,  WBC,  LACTICIDVEN Microbiology Recent Results (from the past 240 hour(s))  Resp Panel by RT-PCR (Flu A&B,  Covid) Nasopharyngeal Swab     Status: None   Collection Time: 04/24/21  6:56 AM   Specimen: Nasopharyngeal Swab; Nasopharyngeal(NP) swabs in vial transport medium  Result Value Ref Range Status   SARS Coronavirus 2 by RT PCR NEGATIVE NEGATIVE Final    Comment: (NOTE) SARS-CoV-2 target nucleic acids are NOT DETECTED.  The SARS-CoV-2 RNA is generally detectable in upper respiratory specimens during the acute phase of infection. The lowest concentration of SARS-CoV-2 viral copies this assay can detect is 138 copies/mL. A negative result does not preclude SARS-Cov-2 infection and should not be used as the sole basis for treatment or other patient management decisions. A negative result may occur with  improper specimen collection/handling, submission of specimen other than nasopharyngeal swab, presence of viral mutation(s) within the areas targeted by this assay, and inadequate number of viral copies(<138 copies/mL). A negative result must be combined with clinical observations, patient history, and epidemiological information. The expected result is Negative.  Fact Sheet for Patients:  EntrepreneurPulse.com.au  Fact Sheet for Healthcare Providers:  IncredibleEmployment.be  This test is no t yet approved or cleared by the [place] FDA and  has been authorized for detection and/or diagnosis of SARS-CoV-2 by FDA under an Emergency Use Authorization (EUA). This EUA will remain  in effect (meaning this test can be used) for the  duration of the COVID-19 declaration under Section 564(b)(1) of the Act, 21 U.S.C.section 360bbb-3(b)(1), unless the authorization is terminated  or revoked sooner.       Influenza A by PCR NEGATIVE NEGATIVE Final   Influenza B by PCR NEGATIVE NEGATIVE Final    Comment: (NOTE) The Xpert Xpress SARS-CoV-2/FLU/RSV plus assay is intended as an aid in the diagnosis of influenza from Nasopharyngeal swab specimens and should not be used as a sole basis for treatment. Nasal washings and aspirates are unacceptable for Xpert Xpress SARS-CoV-2/FLU/RSV testing.  Fact Sheet for Patients: EntrepreneurPulse.com.au  Fact Sheet for Healthcare Providers: IncredibleEmployment.be  This test is not yet approved or cleared by the [place] FDA and has been authorized for detection and/or diagnosis of SARS-CoV-2 by FDA under an Emergency Use Authorization (EUA). This EUA will remain in effect (meaning this test can be used) for the duration of the COVID-19 declaration under Section 564(b)(1) of the Act, 21 U.S.C. section 360bbb-3(b)(1), unless the authorization is terminated or revoked.  Performed at [facility], [place] [address], [place], [place] [address]   Respiratory (~20 pathogens) panel by PCR     Status: None   Collection Time: 04/24/21  6:56 AM   Specimen: Nasopharyngeal Swab; Respiratory  Result Value Ref Range Status   Adenovirus NOT DETECTED NOT DETECTED Final   Coronavirus 229E NOT DETECTED NOT DETECTED Final    Comment: (NOTE) The Coronavirus on the Respiratory Panel, DOES NOT test for the novel  Coronavirus (2019 nCoV)    Coronavirus HKU1 NOT DETECTED NOT DETECTED Final   Coronavirus NL63 NOT DETECTED NOT DETECTED Final   Coronavirus OC43 NOT DETECTED NOT DETECTED Final   Metapneumovirus NOT DETECTED NOT DETECTED Final   Rhinovirus / Enterovirus NOT DETECTED NOT DETECTED Final   Influenza A NOT DETECTED NOT DETECTED Final    Influenza B NOT DETECTED NOT DETECTED Final   Parainfluenza Virus 1 NOT DETECTED NOT DETECTED Final   Parainfluenza Virus 2 NOT DETECTED NOT DETECTED Final   Parainfluenza Virus 3 NOT DETECTED NOT DETECTED Final   Parainfluenza Virus 4 NOT DETECTED NOT DETECTED Final   Respiratory Syncytial Virus  NOT DETECTED NOT DETECTED Final   Bordetella pertussis NOT DETECTED NOT DETECTED Final   Bordetella Parapertussis NOT DETECTED NOT DETECTED Final   Chlamydophila pneumoniae NOT DETECTED NOT DETECTED Final   Mycoplasma pneumoniae NOT DETECTED NOT DETECTED Final    Comment: Performed at [facility], [place] [address], [place], [place] [address]     Time coordinating discharge: Over 30 minutes  SIGNED:   Phillips Climes, MD  [facility] 04/28/2021, 10:23 AM Pager   If 7PM-7AM, please contact night-coverage [email or web address] Password [email or web address]

## 2021-04-28 NOTE — TOC Initial Note (Signed)
Transition of Care [facility]) - Initial/Assessment Note    Patient Details  Name: [name] MRN: [id number] Date of Birth: 1946/04/03  Transition of Care [facility]) CM/SW Contact:    Zenon Mayo, RN Phone Number: 04/28/2021, 9:35 AM  Clinical Narrative:                 For dc today. He is set up with [facility] for [facility] services.   Expected Discharge Plan: [place] Barriers to Discharge: No Barriers Identified   Patient Goals and CMS Choice Patient states their goals for this hospitalization and ongoing recovery are:: return home CMS Medicare.gov Compare Post Acute Care list provided to:: Patient Choice offered to / list presented to : Patient  Expected Discharge Plan and Services Expected Discharge Plan: [place] Acute Care Choice: [place] arrangements for the past 2 months: Single Family Home Expected Discharge Date: 04/28/21                 DME Agency: NA       HH Arranged: RN [place] Agency: [place] Date HH Agency Contacted: 04/27/21 Time HH Agency Contacted: 61 Representative spoke with at [place]: [place] Arrangements/Services Living arrangements for the past 2 months: [place] with:: Spouse Patient language and need for interpreter reviewed:: Yes        Need for Family Participation in Patient Care: Yes (Comment) Care giver support system in place?: Yes (comment)   Criminal Activity/Legal Involvement Pertinent to Current Situation/Hospitalization: No - Comment as needed  Activities of Daily Living Home Assistive Devices/Equipment: Walker (specify type), Grab bars in shower ADL Screening (condition at time of admission) Patient's cognitive ability adequate to safely complete daily activities?: Yes Is the patient deaf or have difficulty hearing?: No Does the patient have difficulty seeing, even when wearing glasses/contacts?: No Does the patient have difficulty  concentrating, remembering, or making decisions?: No Patient able to express need for assistance with ADLs?: Yes Does the patient have difficulty dressing or bathing?: No Independently performs ADLs?: Yes (appropriate for developmental age) Does the patient have difficulty walking or climbing stairs?: No Weakness of Legs: None Weakness of Arms/Hands: None  Permission Sought/Granted                  Emotional Assessment Appearance:: Appears stated age Attitude/Demeanor/Rapport: Engaged Affect (typically observed): Appropriate Orientation: : Oriented to Self, Oriented to Place, Oriented to  Time, Oriented to Situation Alcohol / Substance Use: Not Applicable Psych Involvement: No (comment)  Admission diagnosis:  SOB (shortness of breath) [R06.02] Acute respiratory failure with hypoxia (HCC) [J96.01] Acute on chronic systolic CHF (congestive heart failure) (HCC) [I50.23] Acute on chronic combined systolic and diastolic CHF (congestive heart failure) ([place]) [I50.43] Patient Active Problem List   Diagnosis Date Noted   Acute on chronic combined systolic and diastolic CHF (congestive heart failure) ([place]) 04/24/2021   Anemia 04/24/2021   Acute on chronic respiratory failure with hypoxia ([place]) 04/24/2021   Incarcerated inguinal hernia 03/18/2021   Chronic combined systolic and diastolic congestive heart failure ([place])    Intraparenchymal hemorrhage of brain ([place]) 02/26/2021   SDH (subdural hematoma) 02/21/2021   Stroke ([place]) 02/21/2021   Elevated troponin 02/21/2021   Other spondylosis with radiculopathy, lumbar region 09/04/2018   Centrilobular emphysema ([place]) 07/19/2018   Dilated cardiomyopathy ([place]) 07/19/2018   Coronary artery disease of native artery of native heart with stable angina  pectoris ([place]) 01/20/2018   AAA (abdominal aortic aneurysm) without rupture 10/30/2017   COPD exacerbation ([place]) 04/03/2015   Barrett's esophagus 05/20/2014   Constipation 05/20/2014   Arthritis  of hand 05/07/2014   Pulmonary nodule 11/06/2011   Tobacco abuse 02/12/2011   CAD, NATIVE VESSEL/HLD 10/18/2008   COLONIC POLYPS 08/05/2008   Hyperlipidemia 08/05/2008   Essential hypertension 08/05/2008   GERD 08/05/2008   PCP:  Rusty Aus, MD Pharmacy:   [facility], [place] [address] [place] [address] Phone: [phone] Fax: [phone]  [facility], [place] - [address] [place] [address] Phone: [phone] Fax: [phone]     Social Determinants of Health ([place]) Interventions    Readmission Risk Interventions Readmission Risk Prevention Plan 04/28/2021  Transportation Screening Complete  PCP or Specialist Appt within 3-5 Days Complete  HRI or [place] Complete  Social Work Consult for [place] Planning/Counseling Complete  Palliative Care Screening Not Applicable  Medication Review Press photographer) Complete  Some recent data might be hidden

## 2021-04-28 NOTE — Discharge Instructions (Signed)
Follow with Primary MD Rusty Aus, MD in 7 days   Get CBC, CMP, checked  by Primary MD next visit.    Activity: As tolerated with Full fall precautions use walker/cane & assistance as needed   Disposition Home    Diet: Heart Healthy  , with feeding assistance and aspiration precautions.  For Heart failure patients - Check your Weight same time everyday, if you gain over 2 pounds, or you develop in leg swelling, experience more shortness of breath or chest pain, call your Primary MD immediately. Follow Cardiac Low Salt Diet and 1.5 lit/day fluid restriction.   On your next visit with your primary care physician please Get Medicines reviewed and adjusted.   Please request your Prim.MD to go over all Hospital Tests and Procedure/Radiological results at the follow up, please get all Hospital records sent to your Prim MD by signing hospital release before you go home.   If you experience worsening of your admission symptoms, develop shortness of breath, life threatening emergency, suicidal or homicidal thoughts you must seek medical attention immediately by calling 911 or calling your MD immediately  if symptoms less severe.  You Must read complete instructions/literature along with all the possible adverse reactions/side effects for all the Medicines you take and that have been prescribed to you. Take any new Medicines after you have completely understood and accpet all the possible adverse reactions/side effects.   Do not drive, operating heavy machinery, perform activities at heights, swimming or participation in water activities or provide baby sitting services if your were admitted for syncope or siezures until you have seen by Primary MD or a Neurologist and advised to do so again.  Do not drive when taking Pain medications.    Do not take more than prescribed Pain, Sleep and Anxiety Medications  Special Instructions: If you have smoked or chewed Tobacco  in the last 2 yrs  please stop smoking, stop any regular Alcohol  and or any Recreational drug use.  Wear Seat belts while driving.   Please note  You were cared for by a hospitalist during your hospital stay. If you have any questions about your discharge medications or the care you received while you were in the hospital after you are discharged, you can call the unit and asked to speak with the hospitalist on call if the hospitalist that took care of you is not available. Once you are discharged, your primary care physician will handle any further medical issues. Please note that NO REFILLS for any discharge medications will be authorized once you are discharged, as it is imperative that you return to your primary care physician (or establish a relationship with a primary care physician if you do not have one) for your aftercare needs so that they can reassess your need for medications and monitor your lab values.

## 2021-04-28 NOTE — Progress Notes (Signed)
Physical Therapy Treatment Patient Details Name: [name] MRN: [id number] DOB: 1945/10/12 Today's Date: 04/28/2021   History of Present Illness pt is a 76 y/o male admitted 1/8 with SOB due to acute on chronic systolic and diastolic HF with dilated CM with elevated troponin and COPD exacerbation.  PMHx: s/dCHF, COPD, chronic resp failure on 2L home O2, CAD, HTN, AAA,    PT Comments    Pt reporting having just ambulated with mobility specialist, who reported he was steady with RW. Thus focused session, on pt education and HEP production in prep for d/c home. Provided handout and education on MedBridge HEP consisting of bridges, clamshells, SLR, and supported squats as he displayed weakness primarily in his L hip flexor and bil glut medius muscles. Educated pt on limiting sodium and processed foods intake, pursed lip breathing, increasing frequency of activity, and weighing self daily. Will continue to follow acutely. Current recommendations remain appropriate.   Recommendations for follow up therapy are one component of a multi-disciplinary discharge planning process, led by the attending physician.  Recommendations may be updated based on patient status, additional functional criteria and insurance authorization.  Follow Up Recommendations  Home health PT     Assistance Recommended at Discharge Intermittent Supervision/Assistance  Patient can return home with the following A little help with walking and/or transfers;A lot of help with bathing/dressing/bathroom;Other (comment) (initially by sons)   Equipment Recommendations  None recommended by PT    Recommendations for Other Services       Precautions / Restrictions Precautions Precautions: Fall Restrictions Weight Bearing Restrictions: No     Mobility  Bed Mobility Overal bed mobility: Modified Independent Bed Mobility: Supine to Sit;Sit to Supine;Rolling Rolling: Modified independent (Device/Increase time)   Supine to  sit: Modified independent (Device/Increase time) Sit to supine: Modified independent (Device/Increase time)   General bed mobility comments: No assistance needed, able to perform all bed mobility aspects safely without assistance.    Transfers Overall transfer level: Needs assistance Equipment used: None Transfers: Sit to/from Stand Sit to Stand: Supervision           General transfer comment: Supervision for safety, slightly extra effort, but no LOB.    Ambulation/Gait         Gait velocity: Deferred due to just returned from working with mobility specialist, who confirmed pt was steady with RW. Educated pt to use RW at home, he verbalized understanding.         Stairs             Wheelchair Mobility    Modified Rankin (Stroke Patients Only)       Balance Overall balance assessment: Needs assistance Sitting-balance support: No upper extremity supported;Feet supported Sitting balance-Leahy Scale: Good     Standing balance support: No upper extremity supported Standing balance-Leahy Scale: Fair Standing balance comment: Slight instability noted no UE support standin statically.                            Cognition Arousal/Alertness: Awake/alert Behavior During Therapy: WFL for tasks assessed/performed Overall Cognitive Status: Within Functional Limits for tasks assessed                                          Exercises General Exercises - Lower Extremity Hip ABduction/ADduction: Right;AROM;Other reps (comment);Sidelying (x2 reps) Straight Leg Raises: Left;5 reps;Supine;AROM  Mini-Sqauts: AROM;Strengthening;Both;Standing;Other reps (comment) (x2 reps no UE support) Other Exercises Other Exercises: Bridges x2 supine in bed    General Comments General comments (skin integrity, edema, etc.): Provided handout and education on MedBridge HEP consisting of bridges, clamshells, SLR, and supported squats; educated pt on limiting  sodium and processed foods intake, pursed lip breathing, increasing frequency of activity, and weighing self daily      Pertinent Vitals/Pain Pain Assessment: Faces Faces Pain Scale: No hurt Pain Intervention(s): Monitored during session    Home Living                          Prior Function            PT Goals (current goals can now be found in the care plan section) Acute Rehab PT Goals Patient Stated Goal: to go home today PT Goal Formulation: With patient Time For Goal Achievement: 05/10/21 Potential to Achieve Goals: Good Progress towards PT goals: Progressing toward goals    Frequency    Min 3X/week      PT Plan Current plan remains appropriate    Co-evaluation              AM-PAC PT "6 Clicks" Mobility   Outcome Measure  Help needed turning from your back to your side while in a flat bed without using bedrails?: None Help needed moving from lying on your back to sitting on the side of a flat bed without using bedrails?: None Help needed moving to and from a bed to a chair (including a wheelchair)?: A Little Help needed standing up from a chair using your arms (e.g., wheelchair or bedside chair)?: A Little Help needed to walk in hospital room?: A Little Help needed climbing 3-5 steps with a railing? : A Little 6 Click Score: 20    End of Session   Activity Tolerance: Patient tolerated treatment well Patient left: in bed;with call bell/phone within reach   PT Visit Diagnosis: Unsteadiness on feet (R26.81);Other abnormalities of gait and mobility (R26.89)     Time: 4825-0037 PT Time Calculation (min) (ACUTE ONLY): 12 min  Charges:  $Therapeutic Exercise: 8-22 mins                     [name], PT, DPT Acute Rehabilitation Services  Pager: [phone] Office: [phone]    [name] 04/28/2021, 11:01 AM

## 2021-04-29 ENCOUNTER — Telehealth: Payer: Self-pay

## 2021-04-29 NOTE — Telephone Encounter (Signed)
Incoming fax from [employer], pt is approved for Entresto until 04/16/2022 at no cost to patient

## 2021-05-09 ENCOUNTER — Telehealth: Payer: Self-pay | Admitting: Cardiovascular Disease

## 2021-05-09 DIAGNOSIS — I2510 Atherosclerotic heart disease of native coronary artery without angina pectoris: Secondary | ICD-10-CM | POA: Diagnosis not present

## 2021-05-09 DIAGNOSIS — E785 Hyperlipidemia, unspecified: Secondary | ICD-10-CM | POA: Diagnosis not present

## 2021-05-09 DIAGNOSIS — K219 Gastro-esophageal reflux disease without esophagitis: Secondary | ICD-10-CM | POA: Diagnosis not present

## 2021-05-09 DIAGNOSIS — I110 Hypertensive heart disease with heart failure: Secondary | ICD-10-CM | POA: Diagnosis not present

## 2021-05-09 DIAGNOSIS — I739 Peripheral vascular disease, unspecified: Secondary | ICD-10-CM | POA: Diagnosis not present

## 2021-05-09 DIAGNOSIS — G709 Myoneural disorder, unspecified: Secondary | ICD-10-CM | POA: Diagnosis not present

## 2021-05-09 DIAGNOSIS — Z9981 Dependence on supplemental oxygen: Secondary | ICD-10-CM | POA: Diagnosis not present

## 2021-05-09 DIAGNOSIS — Z8546 Personal history of malignant neoplasm of prostate: Secondary | ICD-10-CM | POA: Diagnosis not present

## 2021-05-09 DIAGNOSIS — Z8673 Personal history of transient ischemic attack (TIA), and cerebral infarction without residual deficits: Secondary | ICD-10-CM | POA: Diagnosis not present

## 2021-05-09 DIAGNOSIS — Z7982 Long term (current) use of aspirin: Secondary | ICD-10-CM | POA: Diagnosis not present

## 2021-05-09 DIAGNOSIS — K4090 Unilateral inguinal hernia, without obstruction or gangrene, not specified as recurrent: Secondary | ICD-10-CM | POA: Diagnosis not present

## 2021-05-09 DIAGNOSIS — I252 Old myocardial infarction: Secondary | ICD-10-CM | POA: Diagnosis not present

## 2021-05-09 DIAGNOSIS — J189 Pneumonia, unspecified organism: Secondary | ICD-10-CM | POA: Diagnosis not present

## 2021-05-09 DIAGNOSIS — J432 Centrilobular emphysema: Secondary | ICD-10-CM | POA: Diagnosis not present

## 2021-05-09 DIAGNOSIS — I713 Abdominal aortic aneurysm, ruptured, unspecified: Secondary | ICD-10-CM | POA: Diagnosis not present

## 2021-05-09 DIAGNOSIS — I5042 Chronic combined systolic (congestive) and diastolic (congestive) heart failure: Secondary | ICD-10-CM | POA: Diagnosis not present

## 2021-05-09 NOTE — Telephone Encounter (Signed)
Patient states he has 4 doses left of Entresto requesting samples til medication is delivered, please assist

## 2021-05-09 NOTE — Telephone Encounter (Signed)
Error

## 2021-05-09 NOTE — Telephone Encounter (Signed)
°*  STAT* If patient is at the pharmacy, call can be transferred to refill team.   1. Which medications need to be refilled? (please list name of each medication and dose if known) Entresto 97-103 mg   2. Which pharmacy/location (including street and city if local pharmacy) is medication to be sent to? [facility]   3. Do they need a 30 day or 90 day supply? 90 day supply

## 2021-05-10 ENCOUNTER — Other Ambulatory Visit: Payer: Self-pay

## 2021-05-10 DIAGNOSIS — I5042 Chronic combined systolic (congestive) and diastolic (congestive) heart failure: Secondary | ICD-10-CM

## 2021-05-10 MED ORDER — ENTRESTO 97-103 MG PO TABS: 1.0000 | ORAL_TABLET | Freq: Two times a day (BID) | ORAL | 0 refills | Status: DC

## 2021-05-10 NOTE — Telephone Encounter (Signed)
Patient calling the office for samples of medication:   1.  What medication and dosage are you requesting samples for?  Entresto   2.  Are you currently out of this medication? Yes approved for assistance but has not arrived yet

## 2021-05-10 NOTE — Telephone Encounter (Signed)
[employer] and spoke with [employer]. She stated that the prescription was ready and they will overnight it to the patient.   Called patient and informed him of this. Patient has his dose for tonight and tomorrow morning. If he does not receive the medication by 3PM tomorrow he will call our office for samples.   Patient was very grateful for the follow up.

## 2021-05-10 NOTE — Telephone Encounter (Signed)
Spoke with Coleen, RN she stated that she is going to call the company to see where the shipment is.

## 2021-05-11 DIAGNOSIS — I509 Heart failure, unspecified: Secondary | ICD-10-CM | POA: Diagnosis not present

## 2021-05-11 DIAGNOSIS — R911 Solitary pulmonary nodule: Secondary | ICD-10-CM | POA: Diagnosis not present

## 2021-05-11 DIAGNOSIS — Z6820 Body mass index (BMI) 20.0-20.9, adult: Secondary | ICD-10-CM | POA: Diagnosis not present

## 2021-05-11 DIAGNOSIS — Z7951 Long term (current) use of inhaled steroids: Secondary | ICD-10-CM | POA: Diagnosis not present

## 2021-05-11 DIAGNOSIS — Z87891 Personal history of nicotine dependence: Secondary | ICD-10-CM | POA: Diagnosis not present

## 2021-05-11 DIAGNOSIS — D692 Other nonthrombocytopenic purpura: Secondary | ICD-10-CM | POA: Diagnosis not present

## 2021-05-11 DIAGNOSIS — J9611 Chronic respiratory failure with hypoxia: Secondary | ICD-10-CM | POA: Diagnosis not present

## 2021-05-11 DIAGNOSIS — J189 Pneumonia, unspecified organism: Secondary | ICD-10-CM | POA: Diagnosis not present

## 2021-05-11 DIAGNOSIS — I420 Dilated cardiomyopathy: Secondary | ICD-10-CM | POA: Diagnosis not present

## 2021-05-11 DIAGNOSIS — E46 Unspecified protein-calorie malnutrition: Secondary | ICD-10-CM | POA: Diagnosis not present

## 2021-05-11 DIAGNOSIS — J449 Chronic obstructive pulmonary disease, unspecified: Secondary | ICD-10-CM | POA: Diagnosis not present

## 2021-05-11 DIAGNOSIS — I5021 Acute systolic (congestive) heart failure: Secondary | ICD-10-CM | POA: Diagnosis not present

## 2021-05-11 NOTE — Telephone Encounter (Signed)
Patient states Delene Loll has not arrived, requesting samples

## 2021-05-11 NOTE — Telephone Encounter (Signed)
Patients wife calling making me aware they have not received there shipment of Entresto 97/103.   Per Coleen, RN it was okay to provide 1 sample bottle to the patient of the 49/51MG .   Patient was instructed to take TWO tablets TWICE a day since this is a lower dose than his original.  Patient and wife was thankful for the call and will be by in a few to grab it.   Medication Samples have been provided to the patient.  Drug name: Delene Loll       Strength: 49-51        Qty: 1 bottle  LOT: [id number]  Exp.Date: 11/24  Jan[place] 2:34 PM 05/11/2021.

## 2021-05-16 DIAGNOSIS — I420 Dilated cardiomyopathy: Secondary | ICD-10-CM | POA: Diagnosis not present

## 2021-05-16 DIAGNOSIS — K56609 Unspecified intestinal obstruction, unspecified as to partial versus complete obstruction: Secondary | ICD-10-CM | POA: Diagnosis not present

## 2021-05-16 DIAGNOSIS — E785 Hyperlipidemia, unspecified: Secondary | ICD-10-CM | POA: Diagnosis not present

## 2021-05-16 DIAGNOSIS — J449 Chronic obstructive pulmonary disease, unspecified: Secondary | ICD-10-CM | POA: Diagnosis not present

## 2021-05-16 DIAGNOSIS — K4030 Unilateral inguinal hernia, with obstruction, without gangrene, not specified as recurrent: Secondary | ICD-10-CM | POA: Diagnosis not present

## 2021-05-19 DIAGNOSIS — I5042 Chronic combined systolic (congestive) and diastolic (congestive) heart failure: Secondary | ICD-10-CM | POA: Diagnosis not present

## 2021-05-19 DIAGNOSIS — I2510 Atherosclerotic heart disease of native coronary artery without angina pectoris: Secondary | ICD-10-CM | POA: Diagnosis not present

## 2021-05-19 DIAGNOSIS — Z8546 Personal history of malignant neoplasm of prostate: Secondary | ICD-10-CM | POA: Diagnosis not present

## 2021-05-19 DIAGNOSIS — Z7982 Long term (current) use of aspirin: Secondary | ICD-10-CM | POA: Diagnosis not present

## 2021-05-19 DIAGNOSIS — G709 Myoneural disorder, unspecified: Secondary | ICD-10-CM | POA: Diagnosis not present

## 2021-05-19 DIAGNOSIS — I739 Peripheral vascular disease, unspecified: Secondary | ICD-10-CM | POA: Diagnosis not present

## 2021-05-19 DIAGNOSIS — I713 Abdominal aortic aneurysm, ruptured, unspecified: Secondary | ICD-10-CM | POA: Diagnosis not present

## 2021-05-19 DIAGNOSIS — I252 Old myocardial infarction: Secondary | ICD-10-CM | POA: Diagnosis not present

## 2021-05-19 DIAGNOSIS — I110 Hypertensive heart disease with heart failure: Secondary | ICD-10-CM | POA: Diagnosis not present

## 2021-05-19 DIAGNOSIS — E785 Hyperlipidemia, unspecified: Secondary | ICD-10-CM | POA: Diagnosis not present

## 2021-05-19 DIAGNOSIS — K219 Gastro-esophageal reflux disease without esophagitis: Secondary | ICD-10-CM | POA: Diagnosis not present

## 2021-05-19 DIAGNOSIS — Z9981 Dependence on supplemental oxygen: Secondary | ICD-10-CM | POA: Diagnosis not present

## 2021-05-19 DIAGNOSIS — K4090 Unilateral inguinal hernia, without obstruction or gangrene, not specified as recurrent: Secondary | ICD-10-CM | POA: Diagnosis not present

## 2021-05-19 DIAGNOSIS — Z8673 Personal history of transient ischemic attack (TIA), and cerebral infarction without residual deficits: Secondary | ICD-10-CM | POA: Diagnosis not present

## 2021-05-19 DIAGNOSIS — J189 Pneumonia, unspecified organism: Secondary | ICD-10-CM | POA: Diagnosis not present

## 2021-05-19 DIAGNOSIS — J432 Centrilobular emphysema: Secondary | ICD-10-CM | POA: Diagnosis not present

## 2021-05-20 DIAGNOSIS — K4090 Unilateral inguinal hernia, without obstruction or gangrene, not specified as recurrent: Secondary | ICD-10-CM | POA: Diagnosis not present

## 2021-05-20 DIAGNOSIS — K219 Gastro-esophageal reflux disease without esophagitis: Secondary | ICD-10-CM | POA: Diagnosis not present

## 2021-05-20 DIAGNOSIS — Z9981 Dependence on supplemental oxygen: Secondary | ICD-10-CM | POA: Diagnosis not present

## 2021-05-20 DIAGNOSIS — G709 Myoneural disorder, unspecified: Secondary | ICD-10-CM | POA: Diagnosis not present

## 2021-05-20 DIAGNOSIS — I5042 Chronic combined systolic (congestive) and diastolic (congestive) heart failure: Secondary | ICD-10-CM | POA: Diagnosis not present

## 2021-05-20 DIAGNOSIS — Z8546 Personal history of malignant neoplasm of prostate: Secondary | ICD-10-CM | POA: Diagnosis not present

## 2021-05-20 DIAGNOSIS — I713 Abdominal aortic aneurysm, ruptured, unspecified: Secondary | ICD-10-CM | POA: Diagnosis not present

## 2021-05-20 DIAGNOSIS — I110 Hypertensive heart disease with heart failure: Secondary | ICD-10-CM | POA: Diagnosis not present

## 2021-05-20 DIAGNOSIS — I2510 Atherosclerotic heart disease of native coronary artery without angina pectoris: Secondary | ICD-10-CM | POA: Diagnosis not present

## 2021-05-20 DIAGNOSIS — J189 Pneumonia, unspecified organism: Secondary | ICD-10-CM | POA: Diagnosis not present

## 2021-05-20 DIAGNOSIS — Z8673 Personal history of transient ischemic attack (TIA), and cerebral infarction without residual deficits: Secondary | ICD-10-CM | POA: Diagnosis not present

## 2021-05-20 DIAGNOSIS — J432 Centrilobular emphysema: Secondary | ICD-10-CM | POA: Diagnosis not present

## 2021-05-20 DIAGNOSIS — I739 Peripheral vascular disease, unspecified: Secondary | ICD-10-CM | POA: Diagnosis not present

## 2021-05-20 DIAGNOSIS — I252 Old myocardial infarction: Secondary | ICD-10-CM | POA: Diagnosis not present

## 2021-05-20 DIAGNOSIS — Z7982 Long term (current) use of aspirin: Secondary | ICD-10-CM | POA: Diagnosis not present

## 2021-05-20 DIAGNOSIS — E785 Hyperlipidemia, unspecified: Secondary | ICD-10-CM | POA: Diagnosis not present

## 2021-05-23 DIAGNOSIS — J449 Chronic obstructive pulmonary disease, unspecified: Secondary | ICD-10-CM | POA: Diagnosis not present

## 2021-05-23 DIAGNOSIS — Z7951 Long term (current) use of inhaled steroids: Secondary | ICD-10-CM | POA: Diagnosis not present

## 2021-05-23 DIAGNOSIS — I509 Heart failure, unspecified: Secondary | ICD-10-CM | POA: Diagnosis not present

## 2021-05-23 DIAGNOSIS — D692 Other nonthrombocytopenic purpura: Secondary | ICD-10-CM | POA: Diagnosis not present

## 2021-05-23 DIAGNOSIS — Z87891 Personal history of nicotine dependence: Secondary | ICD-10-CM | POA: Diagnosis not present

## 2021-05-23 DIAGNOSIS — Z6821 Body mass index (BMI) 21.0-21.9, adult: Secondary | ICD-10-CM | POA: Diagnosis not present

## 2021-05-23 DIAGNOSIS — F330 Major depressive disorder, recurrent, mild: Secondary | ICD-10-CM | POA: Diagnosis not present

## 2021-05-27 DIAGNOSIS — I2510 Atherosclerotic heart disease of native coronary artery without angina pectoris: Secondary | ICD-10-CM | POA: Diagnosis not present

## 2021-05-27 DIAGNOSIS — Z7982 Long term (current) use of aspirin: Secondary | ICD-10-CM | POA: Diagnosis not present

## 2021-05-27 DIAGNOSIS — Z9981 Dependence on supplemental oxygen: Secondary | ICD-10-CM | POA: Diagnosis not present

## 2021-05-27 DIAGNOSIS — E785 Hyperlipidemia, unspecified: Secondary | ICD-10-CM | POA: Diagnosis not present

## 2021-05-27 DIAGNOSIS — I252 Old myocardial infarction: Secondary | ICD-10-CM | POA: Diagnosis not present

## 2021-05-27 DIAGNOSIS — Z8673 Personal history of transient ischemic attack (TIA), and cerebral infarction without residual deficits: Secondary | ICD-10-CM | POA: Diagnosis not present

## 2021-05-27 DIAGNOSIS — K4090 Unilateral inguinal hernia, without obstruction or gangrene, not specified as recurrent: Secondary | ICD-10-CM | POA: Diagnosis not present

## 2021-05-27 DIAGNOSIS — I739 Peripheral vascular disease, unspecified: Secondary | ICD-10-CM | POA: Diagnosis not present

## 2021-05-27 DIAGNOSIS — K219 Gastro-esophageal reflux disease without esophagitis: Secondary | ICD-10-CM | POA: Diagnosis not present

## 2021-05-27 DIAGNOSIS — I5042 Chronic combined systolic (congestive) and diastolic (congestive) heart failure: Secondary | ICD-10-CM | POA: Diagnosis not present

## 2021-05-27 DIAGNOSIS — G709 Myoneural disorder, unspecified: Secondary | ICD-10-CM | POA: Diagnosis not present

## 2021-05-27 DIAGNOSIS — Z8546 Personal history of malignant neoplasm of prostate: Secondary | ICD-10-CM | POA: Diagnosis not present

## 2021-05-27 DIAGNOSIS — I110 Hypertensive heart disease with heart failure: Secondary | ICD-10-CM | POA: Diagnosis not present

## 2021-05-27 DIAGNOSIS — J189 Pneumonia, unspecified organism: Secondary | ICD-10-CM | POA: Diagnosis not present

## 2021-05-27 DIAGNOSIS — I713 Abdominal aortic aneurysm, ruptured, unspecified: Secondary | ICD-10-CM | POA: Diagnosis not present

## 2021-05-27 DIAGNOSIS — J432 Centrilobular emphysema: Secondary | ICD-10-CM | POA: Diagnosis not present

## 2021-06-02 DIAGNOSIS — E782 Mixed hyperlipidemia: Secondary | ICD-10-CM | POA: Diagnosis not present

## 2021-06-02 DIAGNOSIS — E538 Deficiency of other specified B group vitamins: Secondary | ICD-10-CM | POA: Diagnosis not present

## 2021-06-02 DIAGNOSIS — R739 Hyperglycemia, unspecified: Secondary | ICD-10-CM | POA: Diagnosis not present

## 2021-06-02 DIAGNOSIS — J9611 Chronic respiratory failure with hypoxia: Secondary | ICD-10-CM | POA: Diagnosis not present

## 2021-06-02 DIAGNOSIS — I420 Dilated cardiomyopathy: Secondary | ICD-10-CM | POA: Diagnosis not present

## 2021-06-02 DIAGNOSIS — Z125 Encounter for screening for malignant neoplasm of prostate: Secondary | ICD-10-CM | POA: Diagnosis not present

## 2021-06-02 DIAGNOSIS — Z0000 Encounter for general adult medical examination without abnormal findings: Secondary | ICD-10-CM | POA: Diagnosis not present

## 2021-06-13 ENCOUNTER — Telehealth: Payer: Self-pay | Admitting: Cardiovascular Disease

## 2021-06-13 DIAGNOSIS — I5042 Chronic combined systolic (congestive) and diastolic (congestive) heart failure: Secondary | ICD-10-CM

## 2021-06-13 NOTE — Telephone Encounter (Signed)
°*  STAT* If patient is at the pharmacy, call can be transferred to refill team.   1. Which medications need to be refilled? (please list name of each medication and dose if known) Entresto 97-103 1 tablet 2 times daily   2. Which pharmacy/location (including street and city if local pharmacy) is medication to be sent to? [facility] - states that they have not received mail order   3. Do they need a 30 day or 90 day supply? 30 day

## 2021-06-14 ENCOUNTER — Other Ambulatory Visit: Payer: Self-pay | Admitting: *Deleted

## 2021-06-14 DIAGNOSIS — E785 Hyperlipidemia, unspecified: Secondary | ICD-10-CM | POA: Diagnosis not present

## 2021-06-14 DIAGNOSIS — K4030 Unilateral inguinal hernia, with obstruction, without gangrene, not specified as recurrent: Secondary | ICD-10-CM | POA: Diagnosis not present

## 2021-06-14 DIAGNOSIS — K56609 Unspecified intestinal obstruction, unspecified as to partial versus complete obstruction: Secondary | ICD-10-CM | POA: Diagnosis not present

## 2021-06-14 DIAGNOSIS — I420 Dilated cardiomyopathy: Secondary | ICD-10-CM | POA: Diagnosis not present

## 2021-06-14 DIAGNOSIS — J449 Chronic obstructive pulmonary disease, unspecified: Secondary | ICD-10-CM | POA: Diagnosis not present

## 2021-06-14 DIAGNOSIS — I5042 Chronic combined systolic (congestive) and diastolic (congestive) heart failure: Secondary | ICD-10-CM

## 2021-06-14 MED ORDER — ENTRESTO 97-103 MG PO TABS: 1.0000 | ORAL_TABLET | Freq: Two times a day (BID) | ORAL | 3 refills | Status: DC

## 2021-06-14 NOTE — Telephone Encounter (Signed)
I reached out to [facility] pharmacy open 9:00 am. Rep mentioned that they didn't have any active Rx for pt. I have forwarded new Rx via E-script. Rep mentioned that we can forward to [place], [place] for the refill.(Must be [place] location or will not receive) I will make pt aware his request has been forwarded.

## 2021-06-14 NOTE — Telephone Encounter (Signed)
Entresto 49-51 mg tablet. Lot: [id number] Exp: 04/2023

## 2021-06-14 NOTE — Telephone Encounter (Signed)
Pt made aware and mentioned that he only had one wk supply left until he receives shipment. Pt is aware that it can take 1-2 wks to receive medication. I offered one time use 30 day coupon card to use at his local pharmacy. Pt mentioned that he has never used card. Pt was agreeable to contact pharmacy ahead of time for his next refill request. Pt mentioned that he was only sent 30 day supply from [facility] this last shipment and he is running out sooner that he expected. I placed coupon upfront for pick up and he will reach out to our office if he has any issues.

## 2021-06-14 NOTE — Telephone Encounter (Signed)
Entresto 49-51 mg tablet samples 1 bottle given to patient.   Patient was instructed to take TWO tablets TWICE Entresto 49-51 mg tablet a day since this is a lower dose than his original.   Pt unable to use free 30 day and hasn't received his Entresto  97-103 mg tablet through the mail.

## 2021-06-14 NOTE — Telephone Encounter (Signed)
X2 contacted [facility] to request refill [phone]  8:15 am: office closed 8:30: office closed

## 2021-06-14 NOTE — Telephone Encounter (Signed)
Patient calling stating coupon will not work.  Per marina ok to come to office for samples .   Patient aware will be placed at front desk for pick up when able to to ready samples between patients in clinic.

## 2021-06-14 NOTE — Telephone Encounter (Signed)
I spoke to pt and he mentioned that he needs [place] to send Rx to [facility] for 90 days. He is aware that they are closed at the time and that I will reach out to them once they are open.

## 2021-06-21 DIAGNOSIS — I110 Hypertensive heart disease with heart failure: Secondary | ICD-10-CM | POA: Diagnosis not present

## 2021-06-21 DIAGNOSIS — I2510 Atherosclerotic heart disease of native coronary artery without angina pectoris: Secondary | ICD-10-CM | POA: Diagnosis not present

## 2021-06-21 DIAGNOSIS — J432 Centrilobular emphysema: Secondary | ICD-10-CM | POA: Diagnosis not present

## 2021-06-21 DIAGNOSIS — G709 Myoneural disorder, unspecified: Secondary | ICD-10-CM | POA: Diagnosis not present

## 2021-06-21 DIAGNOSIS — J9611 Chronic respiratory failure with hypoxia: Secondary | ICD-10-CM | POA: Diagnosis not present

## 2021-06-21 DIAGNOSIS — I739 Peripheral vascular disease, unspecified: Secondary | ICD-10-CM | POA: Diagnosis not present

## 2021-06-21 DIAGNOSIS — I5042 Chronic combined systolic (congestive) and diastolic (congestive) heart failure: Secondary | ICD-10-CM | POA: Diagnosis not present

## 2021-06-24 NOTE — Telephone Encounter (Signed)
Patient states he has still not received his Entresto. Please call to discuss.

## 2021-06-24 NOTE — Telephone Encounter (Signed)
Per patient he still has not received medication from patient assistance. I am not sure why this has not been directed RN as they are the ones who handles patient assistance. Please contact patient to further assist.  ?

## 2021-06-24 NOTE — Telephone Encounter (Signed)
Patient called back to let us know he is completely out of his Delene Loll

## 2021-06-27 NOTE — Telephone Encounter (Signed)
Able to speak to pt this morning via phone to advised on calling the [employer] regarding his refills for [employer]. Was able to provide number to call. Advised the company handles his refills, they may have been trying to contact him regarding refills and mailing address, their number would be unknown unless stored in his phone.  ? ?Pt is approved from [place] until 03/2022, should have no issues with receiving his refills, they have a script on file that is current. ? ?[name] thankful for following up on this matter and returning his call, he will call the company this morning, otherwise will call back with any future concerns.  ?

## 2021-06-28 DIAGNOSIS — J9611 Chronic respiratory failure with hypoxia: Secondary | ICD-10-CM | POA: Diagnosis not present

## 2021-06-28 DIAGNOSIS — Z87891 Personal history of nicotine dependence: Secondary | ICD-10-CM | POA: Diagnosis not present

## 2021-06-28 DIAGNOSIS — Z8546 Personal history of malignant neoplasm of prostate: Secondary | ICD-10-CM | POA: Diagnosis not present

## 2021-06-28 DIAGNOSIS — J432 Centrilobular emphysema: Secondary | ICD-10-CM | POA: Diagnosis not present

## 2021-06-28 DIAGNOSIS — N200 Calculus of kidney: Secondary | ICD-10-CM | POA: Diagnosis not present

## 2021-06-28 DIAGNOSIS — I714 Abdominal aortic aneurysm, without rupture, unspecified: Secondary | ICD-10-CM | POA: Diagnosis not present

## 2021-06-28 DIAGNOSIS — I2510 Atherosclerotic heart disease of native coronary artery without angina pectoris: Secondary | ICD-10-CM | POA: Diagnosis not present

## 2021-06-28 DIAGNOSIS — E785 Hyperlipidemia, unspecified: Secondary | ICD-10-CM | POA: Diagnosis not present

## 2021-06-28 DIAGNOSIS — Z8673 Personal history of transient ischemic attack (TIA), and cerebral infarction without residual deficits: Secondary | ICD-10-CM | POA: Diagnosis not present

## 2021-06-28 DIAGNOSIS — I739 Peripheral vascular disease, unspecified: Secondary | ICD-10-CM | POA: Diagnosis not present

## 2021-06-28 DIAGNOSIS — Z8701 Personal history of pneumonia (recurrent): Secondary | ICD-10-CM | POA: Diagnosis not present

## 2021-06-28 DIAGNOSIS — I110 Hypertensive heart disease with heart failure: Secondary | ICD-10-CM | POA: Diagnosis not present

## 2021-06-28 DIAGNOSIS — Z9981 Dependence on supplemental oxygen: Secondary | ICD-10-CM | POA: Diagnosis not present

## 2021-06-28 DIAGNOSIS — Z7982 Long term (current) use of aspirin: Secondary | ICD-10-CM | POA: Diagnosis not present

## 2021-06-28 DIAGNOSIS — Z9181 History of falling: Secondary | ICD-10-CM | POA: Diagnosis not present

## 2021-06-28 DIAGNOSIS — K219 Gastro-esophageal reflux disease without esophagitis: Secondary | ICD-10-CM | POA: Diagnosis not present

## 2021-06-28 DIAGNOSIS — I5042 Chronic combined systolic (congestive) and diastolic (congestive) heart failure: Secondary | ICD-10-CM | POA: Diagnosis not present

## 2021-06-28 DIAGNOSIS — G709 Myoneural disorder, unspecified: Secondary | ICD-10-CM | POA: Diagnosis not present

## 2021-06-28 DIAGNOSIS — I252 Old myocardial infarction: Secondary | ICD-10-CM | POA: Diagnosis not present

## 2021-07-26 DIAGNOSIS — I739 Peripheral vascular disease, unspecified: Secondary | ICD-10-CM | POA: Diagnosis not present

## 2021-07-26 DIAGNOSIS — J432 Centrilobular emphysema: Secondary | ICD-10-CM | POA: Diagnosis not present

## 2021-07-26 DIAGNOSIS — Z7982 Long term (current) use of aspirin: Secondary | ICD-10-CM | POA: Diagnosis not present

## 2021-07-26 DIAGNOSIS — Z87891 Personal history of nicotine dependence: Secondary | ICD-10-CM | POA: Diagnosis not present

## 2021-07-26 DIAGNOSIS — I5042 Chronic combined systolic (congestive) and diastolic (congestive) heart failure: Secondary | ICD-10-CM | POA: Diagnosis not present

## 2021-07-26 DIAGNOSIS — G709 Myoneural disorder, unspecified: Secondary | ICD-10-CM | POA: Diagnosis not present

## 2021-07-26 DIAGNOSIS — N200 Calculus of kidney: Secondary | ICD-10-CM | POA: Diagnosis not present

## 2021-07-26 DIAGNOSIS — I252 Old myocardial infarction: Secondary | ICD-10-CM | POA: Diagnosis not present

## 2021-07-26 DIAGNOSIS — Z9181 History of falling: Secondary | ICD-10-CM | POA: Diagnosis not present

## 2021-07-26 DIAGNOSIS — Z9981 Dependence on supplemental oxygen: Secondary | ICD-10-CM | POA: Diagnosis not present

## 2021-07-26 DIAGNOSIS — I2510 Atherosclerotic heart disease of native coronary artery without angina pectoris: Secondary | ICD-10-CM | POA: Diagnosis not present

## 2021-07-26 DIAGNOSIS — E785 Hyperlipidemia, unspecified: Secondary | ICD-10-CM | POA: Diagnosis not present

## 2021-07-26 DIAGNOSIS — Z8546 Personal history of malignant neoplasm of prostate: Secondary | ICD-10-CM | POA: Diagnosis not present

## 2021-07-26 DIAGNOSIS — K219 Gastro-esophageal reflux disease without esophagitis: Secondary | ICD-10-CM | POA: Diagnosis not present

## 2021-07-26 DIAGNOSIS — I714 Abdominal aortic aneurysm, without rupture, unspecified: Secondary | ICD-10-CM | POA: Diagnosis not present

## 2021-07-26 DIAGNOSIS — I110 Hypertensive heart disease with heart failure: Secondary | ICD-10-CM | POA: Diagnosis not present

## 2021-07-26 DIAGNOSIS — Z8701 Personal history of pneumonia (recurrent): Secondary | ICD-10-CM | POA: Diagnosis not present

## 2021-07-26 DIAGNOSIS — J9611 Chronic respiratory failure with hypoxia: Secondary | ICD-10-CM | POA: Diagnosis not present

## 2021-07-26 DIAGNOSIS — Z8673 Personal history of transient ischemic attack (TIA), and cerebral infarction without residual deficits: Secondary | ICD-10-CM | POA: Diagnosis not present

## 2021-08-02 DIAGNOSIS — J431 Panlobular emphysema: Secondary | ICD-10-CM | POA: Diagnosis not present

## 2021-08-02 DIAGNOSIS — J9611 Chronic respiratory failure with hypoxia: Secondary | ICD-10-CM | POA: Diagnosis not present

## 2021-08-02 DIAGNOSIS — I420 Dilated cardiomyopathy: Secondary | ICD-10-CM | POA: Diagnosis not present

## 2021-09-02 DIAGNOSIS — Z87891 Personal history of nicotine dependence: Secondary | ICD-10-CM | POA: Diagnosis not present

## 2021-09-02 DIAGNOSIS — Z7951 Long term (current) use of inhaled steroids: Secondary | ICD-10-CM | POA: Diagnosis not present

## 2021-09-02 DIAGNOSIS — I509 Heart failure, unspecified: Secondary | ICD-10-CM | POA: Diagnosis not present

## 2021-09-02 DIAGNOSIS — Z515 Encounter for palliative care: Secondary | ICD-10-CM | POA: Diagnosis not present

## 2021-09-02 DIAGNOSIS — Z6822 Body mass index (BMI) 22.0-22.9, adult: Secondary | ICD-10-CM | POA: Diagnosis not present

## 2021-09-02 DIAGNOSIS — J449 Chronic obstructive pulmonary disease, unspecified: Secondary | ICD-10-CM | POA: Diagnosis not present

## 2021-09-02 DIAGNOSIS — D692 Other nonthrombocytopenic purpura: Secondary | ICD-10-CM | POA: Diagnosis not present

## 2021-09-02 DIAGNOSIS — Z634 Disappearance and death of family member: Secondary | ICD-10-CM | POA: Diagnosis not present

## 2021-09-02 DIAGNOSIS — F330 Major depressive disorder, recurrent, mild: Secondary | ICD-10-CM | POA: Diagnosis not present

## 2021-09-09 DIAGNOSIS — J431 Panlobular emphysema: Secondary | ICD-10-CM | POA: Diagnosis not present

## 2021-09-09 DIAGNOSIS — I10 Essential (primary) hypertension: Secondary | ICD-10-CM | POA: Diagnosis not present

## 2021-09-09 DIAGNOSIS — F330 Major depressive disorder, recurrent, mild: Secondary | ICD-10-CM | POA: Diagnosis not present

## 2021-09-09 DIAGNOSIS — J441 Chronic obstructive pulmonary disease with (acute) exacerbation: Secondary | ICD-10-CM | POA: Diagnosis not present

## 2021-10-07 DIAGNOSIS — E782 Mixed hyperlipidemia: Secondary | ICD-10-CM | POA: Diagnosis not present

## 2021-10-07 DIAGNOSIS — I420 Dilated cardiomyopathy: Secondary | ICD-10-CM | POA: Diagnosis not present

## 2021-10-07 DIAGNOSIS — J9611 Chronic respiratory failure with hypoxia: Secondary | ICD-10-CM | POA: Diagnosis not present

## 2021-10-12 DIAGNOSIS — Z6822 Body mass index (BMI) 22.0-22.9, adult: Secondary | ICD-10-CM | POA: Diagnosis not present

## 2021-10-12 DIAGNOSIS — Z720 Tobacco use: Secondary | ICD-10-CM | POA: Diagnosis not present

## 2021-10-12 DIAGNOSIS — F330 Major depressive disorder, recurrent, mild: Secondary | ICD-10-CM | POA: Diagnosis not present

## 2021-10-12 DIAGNOSIS — Z7951 Long term (current) use of inhaled steroids: Secondary | ICD-10-CM | POA: Diagnosis not present

## 2021-10-12 DIAGNOSIS — D692 Other nonthrombocytopenic purpura: Secondary | ICD-10-CM | POA: Diagnosis not present

## 2021-10-12 DIAGNOSIS — Z515 Encounter for palliative care: Secondary | ICD-10-CM | POA: Diagnosis not present

## 2021-10-12 DIAGNOSIS — J449 Chronic obstructive pulmonary disease, unspecified: Secondary | ICD-10-CM | POA: Diagnosis not present

## 2021-10-12 DIAGNOSIS — I509 Heart failure, unspecified: Secondary | ICD-10-CM | POA: Diagnosis not present

## 2021-10-12 DIAGNOSIS — Z634 Disappearance and death of family member: Secondary | ICD-10-CM | POA: Diagnosis not present

## 2021-10-20 DIAGNOSIS — Z6821 Body mass index (BMI) 21.0-21.9, adult: Secondary | ICD-10-CM | POA: Diagnosis not present

## 2021-10-20 DIAGNOSIS — Z7951 Long term (current) use of inhaled steroids: Secondary | ICD-10-CM | POA: Diagnosis not present

## 2021-10-20 DIAGNOSIS — J449 Chronic obstructive pulmonary disease, unspecified: Secondary | ICD-10-CM | POA: Diagnosis not present

## 2021-10-20 DIAGNOSIS — F330 Major depressive disorder, recurrent, mild: Secondary | ICD-10-CM | POA: Diagnosis not present

## 2021-10-20 DIAGNOSIS — I5030 Unspecified diastolic (congestive) heart failure: Secondary | ICD-10-CM | POA: Diagnosis not present

## 2021-12-16 DIAGNOSIS — Z6821 Body mass index (BMI) 21.0-21.9, adult: Secondary | ICD-10-CM | POA: Diagnosis not present

## 2021-12-16 DIAGNOSIS — I5030 Unspecified diastolic (congestive) heart failure: Secondary | ICD-10-CM | POA: Diagnosis not present

## 2021-12-16 DIAGNOSIS — G4700 Insomnia, unspecified: Secondary | ICD-10-CM | POA: Diagnosis not present

## 2021-12-16 DIAGNOSIS — F330 Major depressive disorder, recurrent, mild: Secondary | ICD-10-CM | POA: Diagnosis not present

## 2021-12-16 DIAGNOSIS — Z515 Encounter for palliative care: Secondary | ICD-10-CM | POA: Diagnosis not present

## 2021-12-16 DIAGNOSIS — D692 Other nonthrombocytopenic purpura: Secondary | ICD-10-CM | POA: Diagnosis not present

## 2021-12-16 DIAGNOSIS — J449 Chronic obstructive pulmonary disease, unspecified: Secondary | ICD-10-CM | POA: Diagnosis not present

## 2021-12-16 DIAGNOSIS — Z7951 Long term (current) use of inhaled steroids: Secondary | ICD-10-CM | POA: Diagnosis not present

## 2021-12-16 DIAGNOSIS — Z720 Tobacco use: Secondary | ICD-10-CM | POA: Diagnosis not present

## 2021-12-29 ENCOUNTER — Ambulatory Visit: Payer: PPO | Attending: Physician Assistant | Admitting: Physician Assistant

## 2021-12-29 ENCOUNTER — Encounter: Payer: Self-pay | Admitting: Physician Assistant

## 2021-12-29 VITALS — BP 130/73 | HR 63 | Ht 72.0 in | Wt 173.6 lb

## 2021-12-29 DIAGNOSIS — I272 Pulmonary hypertension, unspecified: Secondary | ICD-10-CM

## 2021-12-29 DIAGNOSIS — Z720 Tobacco use: Secondary | ICD-10-CM

## 2021-12-29 DIAGNOSIS — I714 Abdominal aortic aneurysm, without rupture, unspecified: Secondary | ICD-10-CM

## 2021-12-29 DIAGNOSIS — I10 Essential (primary) hypertension: Secondary | ICD-10-CM

## 2021-12-29 DIAGNOSIS — I2510 Atherosclerotic heart disease of native coronary artery without angina pectoris: Secondary | ICD-10-CM

## 2021-12-29 DIAGNOSIS — E785 Hyperlipidemia, unspecified: Secondary | ICD-10-CM

## 2021-12-29 DIAGNOSIS — J432 Centrilobular emphysema: Secondary | ICD-10-CM

## 2021-12-29 DIAGNOSIS — I5042 Chronic combined systolic (congestive) and diastolic (congestive) heart failure: Secondary | ICD-10-CM | POA: Diagnosis not present

## 2021-12-29 DIAGNOSIS — I255 Ischemic cardiomyopathy: Secondary | ICD-10-CM | POA: Diagnosis not present

## 2021-12-29 NOTE — Patient Instructions (Signed)
Medication Instructions:  Your physician recommends that you continue on your current medications as directed. Please refer to the Current Medication list given to you today.  *If you need a refill on your cardiac medications before your next appointment, please call your pharmacy*   Lab Work: None  If you have labs (blood work) drawn today and your tests are completely normal, you will receive your results only by: [place] (if you have MyChart) OR A paper copy in the mail If you have any lab test that is abnormal or we need to change your treatment, we will call you to review the results.   Testing/Procedures: None   Follow-Up: At [facility], you and your health needs are our priority.  As part of our continuing mission to provide you with exceptional heart care, we have created designated Provider Care Teams.  These Care Teams include your primary Cardiologist (physician) and Advanced Practice Providers (APPs -  Physician Assistants and Nurse Practitioners) who all work together to provide you with the care you need, when you need it.  We recommend signing up for the patient portal called "MyChart".  Sign up information is provided on this After Visit Summary.  MyChart is used to connect with patients for Virtual Visits (Telemedicine).  Patients are able to view lab/test results, encounter notes, upcoming appointments, etc.  Non-urgent messages can be sent to your provider as well.   To learn more about what you can do with MyChart, go to [email or web address].    Your next appointment:   1 year(s)  The format for your next appointment:   In Person  Provider:   Ida Rogue, MD or Christell Faith, PA-C      Important Information About [place]

## 2021-12-29 NOTE — Progress Notes (Signed)
Cardiology Office Note    Date:  12/29/2021   ID:  [name], DOB 09-15-1945, MRN [id number]  PCP:  Rusty Aus, MD  Cardiologist:  Ida Rogue, MD  Electrophysiologist:  None   Chief Complaint: Follow-up  History of Present Illness:   [name] is a 76 y.o. male with history of CAD with history of posterior MI in 2002 status post BMS to the LCx status post PCI/BMS to the LAD in 2012, chronic combined systolic and diastolic CHF with ischemic cardiomyopathy, AAA status post endovascular repair in 12/2017 followed by vascular surgery, intraparenchymal hemorrhage in 02/2021, HTN, HLD, COPD, pulmonary hypertension, prostate cancer status post seed implantation, tobacco use, and nephrolithiasis who presents for follow-up of his CAD and cardiomyopathy.  He underwent Myoview in 2010 that was negative for ischemia with an EF of 60%.  Abnormal EKG was noted in the office in 2012 with follow-up nuclear stress testing showing anterior wall ischemia with an estimated EF of 26%.  This was a new wall motion abnormality as well as a newly reduced EF.  Follow-up cardiac cath on 01/05/2011 showed 95% stenosis of the proximal LAD, 95% stenosis of the mid LAD, 30% in-stent restenosis of the mid left circumflex with a second lesion of diffuse 50% stenosis.  The patient underwent successful PCI/BMS to the mid LAD with 0% residual stenosis.  Lexiscan MPI in 12/2017 showed a small defect of moderate severity present in the basal inferior and apex location with findings consistent with prior MI and an EF of 16%.  Overall, this was a high risk study.  Follow-up echo that month demonstrated an EF of 30 to 35%, moderately dilated LV cavity size, mild concentric LVH, grade 1 diastolic dysfunction, mild mitral regurgitation, mildly dilated left atrium, and normal PASP.  Subsequent echocardiograms over the years have demonstrated a persistent cardiomyopathy.  He was last seen in the office in 01/2021 and continued  to be under increased stress.  He was without symptoms decompensation.  He continued to smoke a pack per day.  It was noted he had previously declined evaluation for ICD.  No changes were made.  He was admitted to the hospital in 02/2021 with an intraparenchymal hemorrhage managed conservatively by neurosurgery.  He was admitted to the hospital in 04/2021 with acute on chronic combined CHF and elevated troponin peaking at 448, felt to be related to demand ischemia.  Was improved with IV diuresis.  Echo in 04/2021 demonstrating an EF of 30 to 35% with global hypokinesis, mild, grade 1 diastolic dysfunction, normal RV systolic function with mildly enlarged cavity size, PASP 54.5 mmHg, mildly dilated left atrium, severely dilated right atrium and estimated right atrial pressure of 8 mmHg.  He comes in accompanied by his son today and is doing well from a cardiac perspective, without symptoms of angina or decompensation.  His chronic dyspnea is stable.  No lower extremity swelling or orthopnea.  No early satiety.  He has had 1 mechanical fall over the prior 12 months, though did not hit his head or suffer LOC.  No symptoms concerning for bleeding.  Tolerating cardiac medications without issues.  Appetite is improving.  Overall, he feels like he is doing reasonably well from a cardiac perspective and feels stable.  He continues to decline cardiac cath and referral to EP for consideration of ICD.     Labs independently reviewed: 07/2021 - Hgb 12.8, potassium 3.3, BUN 13, serum creatinine 1.0, albumin 3.5, AST/ALT normal 05/2021 -  TC 142, TG 89, HDL 56, LDL 68, PLT 159, TSH normal, A1c 5.2  Past Medical History:  Diagnosis Date   AAA (abdominal aortic aneurysm) ([place])    a.  CTA 7/19: measured 4.5 x 5.3 cm and greatest transverse dimensions   Abnormal nuclear stress test    a.  Myoview 2012: anterior wall ischemia with an estimated EF of 26%.  This was a new wall motion abnormality as well as a newly reduced EF    Brain bleed ([facility])    COPD (chronic obstructive pulmonary disease) ([place])    Coronary artery disease    a.  Posterior MI in 2002 status post BMS to the LCx; b. [place] 2012: 95% stenosis of the proximal LAD, 95% stenosis of the mid LAD, 30% in-stent restenosis of the mid left circumflex with a second lesion of diffuse 50% stenosis.  The patient underwent successful PCI/BMS to the mid LAD with 0% residual stenosis, LV gram not performed   GERD (gastroesophageal reflux disease)    History of kidney stones    History of prostate cancer    Hyperlipidemia    Hypertension    Myocardial infarction [facility])    Neuromuscular disorder ([place])    Prostate cancer ([place])    a.  Status post seeding   PVD (peripheral vascular disease) ([place])    Stroke [facility])     Past Surgical History:  Procedure Laterality Date   BACK SURGERY  2009/2010   x 2   BOWEL RESECTION N/A 03/18/2021   Procedure: SMALL BOWEL RESECTION;  Surgeon: Jesusita Oka, MD;  Location: [facility] OR;  Service: General;  Laterality: N/A;   CARDIAC CATHETERIZATION  2002   stent placement [place]   ENDOVASCULAR REPAIR/STENT GRAFT N/A 01/02/2018   Procedure: ENDOVASCULAR REPAIR/STENT GRAFT;  Surgeon: Algernon Huxley, MD;  Location: [facility];  Service: Cardiovascular;  Laterality: N/A;   FOOT SURGERY     bilateral    HERNIA REPAIR     bilateral    INGUINAL HERNIA REPAIR Left 03/18/2021   Procedure: INGUINAL HERNIA REPAIR;  Surgeon: Jesusita Oka, MD;  Location: [facility];  Service: General;  Laterality: Left;   INSERTION OF MESH Left 03/18/2021   Procedure: INSERTION OF MESH;  Surgeon: Jesusita Oka, MD;  Location: [facility];  Service: General;  Laterality: Left;   KNEE SURGERY     right knee    PROSTATE SURGERY  09/2009   prostate implant   SPINE SURGERY     UMBILICAL HERNIA REPAIR N/A 03/18/2021   Procedure: [place];  Surgeon: Jesusita Oka, MD;  Location: [facility];  Service: General;  Laterality: N/A;    Current  Medications: Current Meds  Medication Sig   acetaminophen (TYLENOL) 325 MG tablet Take 2 tablets (650 mg total) by mouth every 6 (six) hours as needed for mild pain (or Fever >/= 101).   albuterol (VENTOLIN HFA) 108 (90 Base) MCG/ACT inhaler Inhale 2 puffs into the lungs every 6 (six) hours as needed for wheezing.   atorvastatin (LIPITOR) 80 MG tablet Take 1 tablet (80 mg total) by mouth daily.   budesonide-formoterol (SYMBICORT) 160-4.5 MCG/ACT inhaler Inhale 2 puffs into the lungs 2 (two) times daily.   carvedilol (COREG) 3.125 MG tablet Take 1 tablet (3.125 mg total) by mouth 2 (two) times daily.   furosemide (LASIX) 40 MG tablet Take 40 mg by mouth daily.   mirtazapine (REMERON) 7.5 MG tablet Take 7.5 mg by mouth at bedtime.  Multiple Vitamins-Minerals (MULTIVITAMIN WITH MINERALS) tablet Take 1 tablet by mouth daily.   omeprazole (PRILOSEC) 40 MG capsule TAKE 1 CAPSULE BY MOUTH DAILY USUALLY 30MINUTES BEFORE BREAKFAST (Patient taking differently: Take 40 mg by mouth daily. 30MINUTES BEFORE BREAKFAST)   sacubitril-valsartan (ENTRESTO) 97-103 MG Take 1 tablet by mouth 2 (two) times daily.   spironolactone (ALDACTONE) 25 MG tablet Take 0.5 tablets (12.5 mg total) by mouth daily. FOR CHRONIC COMBINED CHF   valproic acid (DEPAKENE) 250 MG capsule Take 1 capsule (250 mg total) by mouth 2 (two) times daily.    Allergies:   Lyrica [pregabalin] and Prednisone   Social History   Socioeconomic History   Marital status: Married    Spouse name: Not on file   Number of children: Not on file   Years of education: Not on file   Highest education level: Not on file  Occupational History   Not on file  Tobacco Use   Smoking status: Every Day    Packs/day: 0.25    Years: 56.00    Total pack years: 14.00    Types: Cigarettes   Smokeless tobacco: Never   Tobacco comments:    back to smoking X1 year  Vaping Use   Vaping Use: Never used  Substance and Sexual Activity   Alcohol use: No     Alcohol/week: 0.0 standard drinks of alcohol   Drug use: No   Sexual activity: Not on file  Other Topics Concern   Not on file  Social History Narrative   Not on file   Social Determinants of Health   Financial Resource Strain: Low Risk  (08/21/2018)   Overall Financial Resource Strain (CARDIA)    Difficulty of Paying Living Expenses: Not hard at all  Food Insecurity: No Food Insecurity (08/21/2018)   Hunger Vital Sign    Worried About Running Out of Food in the Last Year: Never true    Ran Out of Food in the Last Year: Never true  Transportation Needs: No Transportation Needs (08/21/2018)   PRAPARE - Hydrologist (Medical): No    Lack of Transportation (Non-Medical): No  Physical Activity: Not on file  Stress: No Stress Concern Present (08/21/2018)   [place]    Feeling of Stress : Not at all  Social Connections: Not on file     Family History:  The patient's family history includes Diabetes in his mother; Heart attack in his mother; Heart disease in his father; Lung cancer in his daughter.  ROS:   12-point review of systems is negative unless otherwise noted in the HPI.   EKGs/Labs/Other Studies Reviewed:    Studies reviewed were summarized above. The additional studies were reviewed today:  2D echo 04/25/2021: 1. Left ventricular ejection fraction, by estimation, is 30 to 35%. The  left ventricle has moderately decreased function. The left ventricle  demonstrates global hypokinesis. There is mild left ventricular  hypertrophy. Left ventricular diastolic  parameters are consistent with Grade I diastolic dysfunction (impaired  relaxation).   2. Right ventricular systolic function is normal. The right ventricular  size is mildly enlarged. There is moderately elevated pulmonary artery  systolic pressure. The estimated right ventricular systolic pressure is  67.6 mmHg.   3. Left  atrial size was mildly dilated.   4. Right atrial size was severely dilated.   5. The mitral valve is normal in structure. Mild mitral valve  regurgitation.   6. The  aortic valve was not well visualized. Aortic valve regurgitation  is not visualized. No aortic stenosis is present.   7. The inferior vena cava is dilated in size with >50% respiratory  variability, suggesting right atrial pressure of 8 mmHg. __________  2D echo 02/21/2021: 1. Left ventricular ejection fraction, by estimation, is 40 to 45%. Left  ventricular ejection fraction by 2D MOD biplane is 44.2 %. The left  ventricle has mild to moderately decreased function. The left ventricle  demonstrates global hypokinesis. There  is mild left ventricular hypertrophy of the basal-septal segment. Left  ventricular diastolic parameters are consistent with Grade I diastolic  dysfunction (impaired relaxation).   2. Right ventricular systolic function is normal. The right ventricular  size is normal.   3. The mitral valve is normal in structure. No evidence of mitral valve  regurgitation.   4. The aortic valve was not well visualized. Aortic valve regurgitation  is not visualized.   5. The inferior vena cava is normal in size with <50% respiratory  variability, suggesting right atrial pressure of 8 mmHg. __________  2D echo 06/24/2020: 1. Left ventricular ejection fraction, by estimation, is 30 to 35%. The  left ventricle has moderate to severely decreased function. The left  ventricle demonstrates global hypokinesis. The left ventricular internal  cavity size was mildly dilated. Left  ventricular diastolic parameters are consistent with Grade I diastolic  dysfunction (impaired relaxation).   2. Right ventricular systolic function is low normal. The right  ventricular size is normal.   3. The mitral valve is normal in structure. No evidence of mitral valve  regurgitation.   4. The aortic valve is grossly normal. Aortic valve  regurgitation is  trivial. __________  2D echo 10/25/2018: 1. Severe hypokinesis of the left ventricular, mid-apical anteroseptal  wall, anterior wall, inferoseptal wall and apical segment.   2. The left ventricle has moderate-severely reduced systolic function,  with an ejection fraction of 30-35%. The cavity size was moderately  dilated. There is mild concentric left ventricular hypertrophy. Left  ventricular diastolic Doppler parameters are  consistent with impaired relaxation.   3. The right ventricle has normal systolic function. The cavity was  normal. There is no increase in right ventricular wall thickness.   4. Left atrial size was mildly dilated.   5. Right atrial size was mildly dilated.   6. The mitral valve is grossly normal.   7. The tricuspid valve is grossly normal.   8. The aortic valve is grossly normal. Aortic valve regurgitation is  trivial by color flow Doppler. No stenosis of the aortic valve. __________  2D echo 12/25/2017: - Left ventricle: The cavity size was moderately dilated. There was    mild concentric hypertrophy. Systolic function was moderately to    severely reduced. The estimated ejection fraction was in the    range of 30% to 35%. Wall motion was normal; there were no    regional wall motion abnormalities. Doppler parameters are    consistent with abnormal left ventricular relaxation (grade 1    diastolic dysfunction).  - Aorta: Aortic root dimension: 39 mm (ED).  - Mitral valve: There was mild regurgitation.  - Left atrium: The atrium was mildly dilated.  - Pulmonary arteries: Systolic pressure was within the normal    range. __________  [name] MPI 12/21/2017: T wave inversion was noted during stress in the V5 and V6 leads. There was no ST segment deviation noted during stress. Defect 1: There is a small defect of  moderate severity present in the basal inferior and apex location. Findings consistent with prior myocardial infarction. This is  a high risk study. Nuclear stress EF: 16%.    EKG:  EKG is ordered today.  The EKG ordered today demonstrates NSR, 63 bpm, occasional grouped PVCs, LVH with early repolarization abnormality, prior inferior infarct  Recent Labs: 04/24/2021: ALT 23; B Natriuretic Peptide 3,545.6 04/25/2021: Magnesium 1.9 04/26/2021: Hemoglobin 11.7; Platelets 170 04/28/2021: BUN 21; Creatinine, Ser 0.99; Potassium 4.0; Sodium 140  Recent Lipid Panel    Component Value Date/Time   CHOL 110 04/22/2018 0801   CHOL 154 03/04/2013 0831   TRIG 81 02/22/2021 0546   HDL 37 (L) 04/22/2018 0801   HDL 39 (L) 03/04/2013 0831   CHOLHDL 3.0 04/22/2018 0801   VLDL 11 04/22/2018 0801   LDLCALC 62 04/22/2018 0801   LDLCALC 88 03/04/2013 0831    PHYSICAL EXAM:    VS:  BP 130/73 (BP Location: Left Arm, Patient Position: Sitting, Cuff Size: Normal)   Pulse 63   Ht 6' (1.829 m)   Wt 173 lb 9.6 oz (78.7 kg)   SpO2 98%   BMI 23.54 kg/m   BMI: Body mass index is 23.54 kg/m.  Physical Exam Constitutional:      Appearance: He is well-developed.  HENT:     Head: Normocephalic and atraumatic.  Eyes:     General:        Right eye: No discharge.        Left eye: No discharge.  Neck:     Vascular: No JVD.  Cardiovascular:     Rate and Rhythm: Normal rate and regular rhythm.     Pulses:          Posterior tibial pulses are 2+ on the right side and 2+ on the left side.     Heart sounds: Normal heart sounds, S1 normal and S2 normal. Heart sounds not distant. No midsystolic click and no opening snap. No murmur heard.    No friction rub.  Pulmonary:     Effort: Pulmonary effort is normal. No respiratory distress.     Breath sounds: Normal breath sounds. No decreased breath sounds, wheezing or rales.  Chest:     Chest wall: No tenderness.  Abdominal:     General: There is no distension.     Palpations: Abdomen is soft.     Tenderness: There is no abdominal tenderness.  Musculoskeletal:     Cervical back: Normal  range of motion.     Right lower leg: No edema.     Left lower leg: No edema.  Skin:    General: Skin is warm and dry.     Nails: There is no clubbing.  Neurological:     Mental Status: He is alert and oriented to person, place, and time.  Psychiatric:        Speech: Speech normal.        Behavior: Behavior normal.        Thought Content: Thought content normal.        Judgment: Judgment normal.     Wt Readings from Last 3 Encounters:  12/29/21 173 lb 9.6 oz (78.7 kg)  04/28/21 149 lb 8 oz (67.8 kg)  04/07/21 166 lb 12.8 oz (75.7 kg)     ASSESSMENT & PLAN:   CAD involving the native coronary arteries without angina: He is doing well without symptoms concerning for angina or decompensation.  He remains on aspirin 81 mg daily (this  was confirmed with him and his son today) along with atorvastatin, and carvedilol.  With regards to his persistent cardiomyopathy, he has historically, and continues to today, decline cardiac cath for further evaluation.  Overall, he is largely asymptomatic with stable dyspnea that is likely multifactorial including underlying CAD, cardiomyopathy, pulmonary hypertension, COPD, and ongoing tobacco use.  Defer further cardiac testing at his request.  Chronic combined systolic and diastolic CHF/ICM: He appears euvolemic and well compensated.  His weight is up 6 pounds when compared to his visit last year, however this appears to be in the setting of increased caloric intake.  Continue current GDMT including carvedilol, furosemide, Entresto, and spironolactone.  Recent renal function and electrolytes stable.  Defer addition of SGLT2 inhibitor in an effort to minimize medications.  He has declined cardiac cath and ICD again today.  Pulmonary hypertension: Likely in the setting of COPD.  He remains on low-dose furosemide as above.    HTN: Blood pressure reasonably controlled in the office today.  Continue medical therapy as outlined above.  HLD: LDL 68 in 05/2021.   He remains on atorvastatin.  AAA: Status post endovascular repair in 12/2017.  Follow-up with vascular surgery as directed.  COPD with ongoing tobacco use: Likely contributing to pulmonary hypertension.    Disposition: F/u with Dr. Rockey Situ or an APP in 12 months.   Medication Adjustments/Labs and Tests Ordered: Current medicines are reviewed at length with the patient today.  Concerns regarding medicines are outlined above. Medication changes, Labs and Tests ordered today are summarized above and listed in the Patient Instructions accessible in Encounters.   Signed, Christell Faith, PA-C 12/29/2021 4:13 PM     [address] [place], [place] [address] [phone]

## 2022-02-01 DIAGNOSIS — E782 Mixed hyperlipidemia: Secondary | ICD-10-CM | POA: Diagnosis not present

## 2022-02-07 DIAGNOSIS — I420 Dilated cardiomyopathy: Secondary | ICD-10-CM | POA: Diagnosis not present

## 2022-02-07 DIAGNOSIS — E782 Mixed hyperlipidemia: Secondary | ICD-10-CM | POA: Diagnosis not present

## 2022-02-07 DIAGNOSIS — Z23 Encounter for immunization: Secondary | ICD-10-CM | POA: Diagnosis not present

## 2022-02-07 DIAGNOSIS — J9611 Chronic respiratory failure with hypoxia: Secondary | ICD-10-CM | POA: Diagnosis not present

## 2022-02-07 DIAGNOSIS — Z125 Encounter for screening for malignant neoplasm of prostate: Secondary | ICD-10-CM | POA: Diagnosis not present

## 2022-02-07 DIAGNOSIS — R739 Hyperglycemia, unspecified: Secondary | ICD-10-CM | POA: Diagnosis not present

## 2022-03-29 ENCOUNTER — Telehealth: Payer: Self-pay | Admitting: Cardiovascular Disease

## 2022-03-29 NOTE — Telephone Encounter (Signed)
Paperwork pulled from nurse bin for Dr. Rockey Situ. The patient dropped off paperwork for [employer] Patient Assistance for [employer].   There is no proof of income attached.   I attempted to contact the patient's primary #, which is for his wife, to follow up regarding proof of income.   No answer- voice mail box is full.   The patient last saw Christell Faith, PA. Thurmond Butts has signed the patient's form.  Will attempt to contact the patient at a later time.   Paperwork placed on Pam's cart.

## 2022-03-29 NOTE — Telephone Encounter (Signed)
Patient dropped off PAF placed in box 

## 2022-03-30 NOTE — Telephone Encounter (Signed)
Forms placed in "Pending patient assistance" folder on bottom of [name]'s cart.

## 2022-03-30 NOTE — Telephone Encounter (Signed)
Spoke with patient and reviewed what is needed to submit application. He verbalized understanding with no further questions at this time.   Holding application until he brings in the income documentation. Placed in bottom of cart.

## 2022-04-03 NOTE — Telephone Encounter (Signed)
All documentation ready and will fax out to company and then file

## 2022-04-06 NOTE — Telephone Encounter (Signed)
Application completed, faxed, and filed.

## 2022-04-12 NOTE — Telephone Encounter (Signed)
Received Delene Loll approval from [employer] by fax. Entresto approved through 04/17/2023 at no cost to the patient.  Patient contacted of approval.

## 2022-06-14 DIAGNOSIS — E782 Mixed hyperlipidemia: Secondary | ICD-10-CM | POA: Diagnosis not present

## 2022-06-14 DIAGNOSIS — Z125 Encounter for screening for malignant neoplasm of prostate: Secondary | ICD-10-CM | POA: Diagnosis not present

## 2022-06-14 DIAGNOSIS — R739 Hyperglycemia, unspecified: Secondary | ICD-10-CM | POA: Diagnosis not present

## 2022-06-21 DIAGNOSIS — F330 Major depressive disorder, recurrent, mild: Secondary | ICD-10-CM | POA: Diagnosis not present

## 2022-06-21 DIAGNOSIS — I420 Dilated cardiomyopathy: Secondary | ICD-10-CM | POA: Diagnosis not present

## 2022-06-21 DIAGNOSIS — E782 Mixed hyperlipidemia: Secondary | ICD-10-CM | POA: Diagnosis not present

## 2022-06-21 DIAGNOSIS — J9611 Chronic respiratory failure with hypoxia: Secondary | ICD-10-CM | POA: Diagnosis not present

## 2022-06-21 DIAGNOSIS — Z0000 Encounter for general adult medical examination without abnormal findings: Secondary | ICD-10-CM | POA: Diagnosis not present

## 2022-06-21 DIAGNOSIS — I714 Abdominal aortic aneurysm, without rupture, unspecified: Secondary | ICD-10-CM | POA: Diagnosis not present

## 2022-06-21 DIAGNOSIS — J431 Panlobular emphysema: Secondary | ICD-10-CM | POA: Diagnosis not present

## 2022-11-14 DIAGNOSIS — J449 Chronic obstructive pulmonary disease, unspecified: Secondary | ICD-10-CM | POA: Diagnosis not present

## 2022-11-14 DIAGNOSIS — Z515 Encounter for palliative care: Secondary | ICD-10-CM | POA: Diagnosis not present

## 2022-11-14 DIAGNOSIS — I10 Essential (primary) hypertension: Secondary | ICD-10-CM | POA: Diagnosis not present

## 2022-12-12 ENCOUNTER — Encounter: Payer: Self-pay | Admitting: Emergency Medicine

## 2022-12-12 ENCOUNTER — Other Ambulatory Visit: Payer: Self-pay

## 2022-12-12 ENCOUNTER — Observation Stay (HOSPITAL_COMMUNITY)
Admit: 2022-12-12 | Discharge: 2022-12-12 | Disposition: A | Discharge status: Home / Self Care | Payer: PPO | Attending: Internal Medicine | Admitting: Internal Medicine

## 2022-12-12 ENCOUNTER — Emergency Department: Payer: PPO

## 2022-12-12 ENCOUNTER — Inpatient Hospital Stay
Admission: EM | Admit: 2022-12-12 | Discharge: 2022-12-14 | DRG: 190 | Disposition: A | Discharge status: Home Health | Payer: PPO | Attending: Internal Medicine | Admitting: Internal Medicine

## 2022-12-12 DIAGNOSIS — Z79899 Other long term (current) drug therapy: Secondary | ICD-10-CM

## 2022-12-12 DIAGNOSIS — K219 Gastro-esophageal reflux disease without esophagitis: Secondary | ICD-10-CM | POA: Diagnosis present

## 2022-12-12 DIAGNOSIS — T501X5A Adverse effect of loop [high-ceiling] diuretics, initial encounter: Secondary | ICD-10-CM | POA: Diagnosis present

## 2022-12-12 DIAGNOSIS — Z955 Presence of coronary angioplasty implant and graft: Secondary | ICD-10-CM

## 2022-12-12 DIAGNOSIS — R059 Cough, unspecified: Secondary | ICD-10-CM | POA: Diagnosis not present

## 2022-12-12 DIAGNOSIS — I110 Hypertensive heart disease with heart failure: Secondary | ICD-10-CM | POA: Diagnosis present

## 2022-12-12 DIAGNOSIS — J9622 Acute and chronic respiratory failure with hypercapnia: Secondary | ICD-10-CM | POA: Diagnosis present

## 2022-12-12 DIAGNOSIS — Z833 Family history of diabetes mellitus: Secondary | ICD-10-CM

## 2022-12-12 DIAGNOSIS — Z7902 Long term (current) use of antithrombotics/antiplatelets: Secondary | ICD-10-CM

## 2022-12-12 DIAGNOSIS — I5043 Acute on chronic combined systolic (congestive) and diastolic (congestive) heart failure: Secondary | ICD-10-CM | POA: Diagnosis present

## 2022-12-12 DIAGNOSIS — E876 Hypokalemia: Secondary | ICD-10-CM | POA: Diagnosis not present

## 2022-12-12 DIAGNOSIS — J449 Chronic obstructive pulmonary disease, unspecified: Secondary | ICD-10-CM | POA: Diagnosis present

## 2022-12-12 DIAGNOSIS — J9601 Acute respiratory failure with hypoxia: Secondary | ICD-10-CM | POA: Diagnosis not present

## 2022-12-12 DIAGNOSIS — R0602 Shortness of breath: Secondary | ICD-10-CM | POA: Diagnosis not present

## 2022-12-12 DIAGNOSIS — J441 Chronic obstructive pulmonary disease with (acute) exacerbation: Principal | ICD-10-CM | POA: Diagnosis present

## 2022-12-12 DIAGNOSIS — Z8546 Personal history of malignant neoplasm of prostate: Secondary | ICD-10-CM

## 2022-12-12 DIAGNOSIS — I700 Atherosclerosis of aorta: Secondary | ICD-10-CM | POA: Diagnosis not present

## 2022-12-12 DIAGNOSIS — Z801 Family history of malignant neoplasm of trachea, bronchus and lung: Secondary | ICD-10-CM

## 2022-12-12 DIAGNOSIS — F17210 Nicotine dependence, cigarettes, uncomplicated: Secondary | ICD-10-CM | POA: Diagnosis present

## 2022-12-12 DIAGNOSIS — I739 Peripheral vascular disease, unspecified: Secondary | ICD-10-CM | POA: Diagnosis present

## 2022-12-12 DIAGNOSIS — Z888 Allergy status to other drugs, medicaments and biological substances status: Secondary | ICD-10-CM

## 2022-12-12 DIAGNOSIS — I714 Abdominal aortic aneurysm, without rupture, unspecified: Secondary | ICD-10-CM | POA: Diagnosis present

## 2022-12-12 DIAGNOSIS — J9621 Acute and chronic respiratory failure with hypoxia: Secondary | ICD-10-CM | POA: Diagnosis present

## 2022-12-12 DIAGNOSIS — I2510 Atherosclerotic heart disease of native coronary artery without angina pectoris: Secondary | ICD-10-CM | POA: Diagnosis present

## 2022-12-12 DIAGNOSIS — Z8249 Family history of ischemic heart disease and other diseases of the circulatory system: Secondary | ICD-10-CM

## 2022-12-12 DIAGNOSIS — Z8673 Personal history of transient ischemic attack (TIA), and cerebral infarction without residual deficits: Secondary | ICD-10-CM

## 2022-12-12 DIAGNOSIS — Z7951 Long term (current) use of inhaled steroids: Secondary | ICD-10-CM

## 2022-12-12 DIAGNOSIS — Z7982 Long term (current) use of aspirin: Secondary | ICD-10-CM

## 2022-12-12 DIAGNOSIS — Z1152 Encounter for screening for COVID-19: Secondary | ICD-10-CM

## 2022-12-12 DIAGNOSIS — I252 Old myocardial infarction: Secondary | ICD-10-CM

## 2022-12-12 DIAGNOSIS — Z87442 Personal history of urinary calculi: Secondary | ICD-10-CM

## 2022-12-12 DIAGNOSIS — E785 Hyperlipidemia, unspecified: Secondary | ICD-10-CM | POA: Diagnosis present

## 2022-12-12 LAB — ECHOCARDIOGRAM COMPLETE
AR max vel: 2.72 cm2
AV Area VTI: 2.93 cm2
AV Area mean vel: 2.77 cm2
AV Mean grad: 4 mmHg
AV Peak grad: 9.9 mmHg
Ao pk vel: 1.57 m/s
Area-P 1/2: 1.81 cm2
Calc EF: 40.6 %
Height: 72 in
MV VTI: 2.09 cm2
S' Lateral: 3 cm
Single Plane A2C EF: 45.6 %
Single Plane A4C EF: 41.4 %
Weight: 3040 oz

## 2022-12-12 LAB — RESP PANEL BY RT-PCR (RSV, FLU A&B, COVID)  RVPGX2
Influenza A by PCR: NEGATIVE
Influenza B by PCR: NEGATIVE
Resp Syncytial Virus by PCR: NEGATIVE
SARS Coronavirus 2 by RT PCR: NEGATIVE

## 2022-12-12 LAB — BASIC METABOLIC PANEL
Anion gap: 5 (ref 5–15)
BUN: 19 mg/dL (ref 8–23)
CO2: 33 mmol/L — ABNORMAL HIGH (ref 22–32)
Calcium: 8.3 mg/dL — ABNORMAL LOW (ref 8.9–10.3)
Chloride: 107 mmol/L (ref 98–111)
Creatinine, Ser: 1.09 mg/dL (ref 0.61–1.24)
GFR, Estimated: >60 mL/min (ref >60)
Glucose, Bld: 116 mg/dL — ABNORMAL HIGH (ref 70–99)
Potassium: 3.4 mmol/L — ABNORMAL LOW (ref 3.5–5.1)
Sodium: 145 mmol/L (ref 135–145)

## 2022-12-12 LAB — EXPECTORATED SPUTUM ASSESSMENT W GRAM STAIN, RFLX TO RESP C

## 2022-12-12 LAB — CBC
HCT: 44.4 % (ref 39.0–52.0)
Hemoglobin: 14.3 g/dL (ref 13.0–17.0)
MCH: 31.8 pg (ref 26.0–34.0)
MCHC: 32.2 g/dL (ref 30.0–36.0)
MCV: 98.9 fL (ref 80.0–100.0)
Platelets: 172 10*3/uL (ref 150–400)
RBC: 4.49 MIL/uL (ref 4.22–5.81)
RDW: 13.2 % (ref 11.5–15.5)
WBC: 10.5 10*3/uL (ref 4.0–10.5)
nRBC: 0 % (ref 0.0–0.2)

## 2022-12-12 LAB — BRAIN NATRIURETIC PEPTIDE: B Natriuretic Peptide: 926.2 pg/mL — ABNORMAL HIGH (ref 0.0–100.0)

## 2022-12-12 LAB — TROPONIN I (HIGH SENSITIVITY)
Troponin I (High Sensitivity): 22 ng/L — ABNORMAL HIGH (ref <18)
Troponin I (High Sensitivity): 20 ng/L — ABNORMAL HIGH (ref <18)

## 2022-12-12 LAB — HIV ANTIBODY (ROUTINE TESTING W REFLEX): HIV Screen 4th Generation wRfx: NONREACTIVE

## 2022-12-12 LAB — BLOOD GAS, VENOUS
Acid-Base Excess: 10.6 mmol/L — ABNORMAL HIGH (ref 0.0–2.0)
Bicarbonate: 39 mmol/L — ABNORMAL HIGH (ref 20.0–28.0)
O2 Saturation: 83.9 %
Patient temperature: 37
pCO2, Ven: 69 mmHg — ABNORMAL HIGH (ref 44–60)
pH, Ven: 7.36 (ref 7.25–7.43)
pO2, Ven: 53 mmHg — ABNORMAL HIGH (ref 32–45)

## 2022-12-12 MED ORDER — ONDANSETRON HCL 4 MG/2ML IJ SOLN
4.0000 mg | Freq: Four times a day (QID) | INTRAMUSCULAR | Status: DC | PRN
  Administered 2022-12-14: 4 mg via INTRAVENOUS
  Filled 2022-12-12: qty 2

## 2022-12-12 MED ORDER — ACETAMINOPHEN 325 MG PO TABS: 650.0000 mg | ORAL_TABLET | Freq: Four times a day (QID) | ORAL | Status: DC | PRN

## 2022-12-12 MED ORDER — AZITHROMYCIN 500 MG PO TABS
500.0000 mg | ORAL_TABLET | Freq: Every day | ORAL | Status: DC
  Administered 2022-12-12 – 2022-12-14 (×3): 500 mg via ORAL
  Filled 2022-12-12 (×3): qty 1

## 2022-12-12 MED ORDER — ALBUTEROL SULFATE HFA 108 (90 BASE) MCG/ACT IN AERS: 2.0000 | INHALATION_SPRAY | Freq: Four times a day (QID) | RESPIRATORY_TRACT | Status: DC | PRN

## 2022-12-12 MED ORDER — ENSURE ENLIVE PO LIQD
237.0000 mL | Freq: Three times a day (TID) | ORAL | Status: DC
  Administered 2022-12-12 – 2022-12-13 (×4): 237 mL via ORAL

## 2022-12-12 MED ORDER — ALBUTEROL SULFATE (2.5 MG/3ML) 0.083% IN NEBU: 2.5000 mg | INHALATION_SOLUTION | RESPIRATORY_TRACT | Status: DC | PRN

## 2022-12-12 MED ORDER — NICOTINE 14 MG/24HR TD PT24
14.0000 mg | MEDICATED_PATCH | Freq: Every day | TRANSDERMAL | Status: DC
  Administered 2022-12-12 – 2022-12-14 (×3): 14 mg via TRANSDERMAL
  Filled 2022-12-12 (×4): qty 1

## 2022-12-12 MED ORDER — SODIUM CHLORIDE 0.9 % IV SOLN: 250.0000 mL | INTRAVENOUS | Status: DC | PRN

## 2022-12-12 MED ORDER — POTASSIUM CHLORIDE CRYS ER 20 MEQ PO TBCR
40.0000 meq | EXTENDED_RELEASE_TABLET | Freq: Once | ORAL | Status: AC
  Administered 2022-12-12: 40 meq via ORAL
  Filled 2022-12-12: qty 2

## 2022-12-12 MED ORDER — ACETAMINOPHEN 325 MG PO TABS: 650.0000 mg | ORAL_TABLET | ORAL | Status: DC | PRN

## 2022-12-12 MED ORDER — ASPIRIN 81 MG PO TBEC
81.0000 mg | DELAYED_RELEASE_TABLET | Freq: Every day | ORAL | Status: DC
  Administered 2022-12-12 – 2022-12-14 (×3): 81 mg via ORAL
  Filled 2022-12-12 (×4): qty 1

## 2022-12-12 MED ORDER — VALPROIC ACID 250 MG PO CAPS
250.0000 mg | ORAL_CAPSULE | Freq: Two times a day (BID) | ORAL | Status: DC
  Administered 2022-12-12 – 2022-12-14 (×5): 250 mg via ORAL
  Filled 2022-12-12 (×6): qty 1

## 2022-12-12 MED ORDER — SACUBITRIL-VALSARTAN 97-103 MG PO TABS
1.0000 | ORAL_TABLET | Freq: Two times a day (BID) | ORAL | Status: DC
  Administered 2022-12-12 – 2022-12-14 (×5): 1 via ORAL
  Filled 2022-12-12 (×6): qty 1

## 2022-12-12 MED ORDER — PANTOPRAZOLE SODIUM 40 MG PO TBEC
40.0000 mg | DELAYED_RELEASE_TABLET | Freq: Every day | ORAL | Status: DC
  Administered 2022-12-12 – 2022-12-14 (×3): 40 mg via ORAL
  Filled 2022-12-12 (×3): qty 1

## 2022-12-12 MED ORDER — SPIRONOLACTONE 12.5 MG HALF TABLET
12.5000 mg | ORAL_TABLET | Freq: Every day | ORAL | Status: DC
  Administered 2022-12-12 – 2022-12-14 (×3): 12.5 mg via ORAL
  Filled 2022-12-12 (×3): qty 1

## 2022-12-12 MED ORDER — ENOXAPARIN SODIUM 40 MG/0.4ML IJ SOSY
40.0000 mg | PREFILLED_SYRINGE | INTRAMUSCULAR | Status: DC
  Administered 2022-12-12 – 2022-12-14 (×3): 40 mg via SUBCUTANEOUS
  Filled 2022-12-12 (×3): qty 0.4

## 2022-12-12 MED ORDER — MOMETASONE FURO-FORMOTEROL FUM 200-5 MCG/ACT IN AERO
2.0000 | INHALATION_SPRAY | Freq: Two times a day (BID) | RESPIRATORY_TRACT | Status: DC
  Filled 2022-12-12: qty 8.8

## 2022-12-12 MED ORDER — METHYLPREDNISOLONE SODIUM SUCC 125 MG IJ SOLR: 125.0000 mg | Freq: Once | INTRAMUSCULAR | Status: DC

## 2022-12-12 MED ORDER — DEXAMETHASONE SODIUM PHOSPHATE 10 MG/ML IJ SOLN
10.0000 mg | Freq: Once | INTRAMUSCULAR | Status: AC
  Administered 2022-12-12: 10 mg via INTRAVENOUS
  Filled 2022-12-12: qty 1

## 2022-12-12 MED ORDER — CARVEDILOL 3.125 MG PO TABS
3.1250 mg | ORAL_TABLET | Freq: Two times a day (BID) | ORAL | Status: DC
  Administered 2022-12-12 – 2022-12-14 (×5): 3.125 mg via ORAL
  Filled 2022-12-12 (×5): qty 1

## 2022-12-12 MED ORDER — FUROSEMIDE 10 MG/ML IJ SOLN
40.0000 mg | Freq: Two times a day (BID) | INTRAMUSCULAR | Status: DC
  Administered 2022-12-12 – 2022-12-14 (×6): 40 mg via INTRAVENOUS
  Filled 2022-12-12 (×7): qty 4

## 2022-12-12 MED ORDER — ROPINIROLE HCL 1 MG PO TABS
0.5000 mg | ORAL_TABLET | Freq: Three times a day (TID) | ORAL | Status: DC
  Administered 2022-12-12 – 2022-12-14 (×8): 0.5 mg via ORAL
  Filled 2022-12-12 (×6): qty 1
  Filled 2022-12-12: qty 2
  Filled 2022-12-12: qty 1

## 2022-12-12 MED ORDER — IPRATROPIUM-ALBUTEROL 0.5-2.5 (3) MG/3ML IN SOLN
9.0000 mL | Freq: Once | RESPIRATORY_TRACT | Status: AC
  Administered 2022-12-12: 9 mL via RESPIRATORY_TRACT
  Filled 2022-12-12: qty 9

## 2022-12-12 MED ORDER — FLUTICASONE FUROATE-VILANTEROL 200-25 MCG/ACT IN AEPB
1.0000 | INHALATION_SPRAY | Freq: Every day | RESPIRATORY_TRACT | Status: DC
  Administered 2022-12-13 – 2022-12-14 (×2): 1 via RESPIRATORY_TRACT
  Filled 2022-12-12: qty 28

## 2022-12-12 MED ORDER — ATORVASTATIN CALCIUM 20 MG PO TABS
80.0000 mg | ORAL_TABLET | Freq: Every day | ORAL | Status: DC
  Administered 2022-12-12 – 2022-12-14 (×3): 80 mg via ORAL
  Filled 2022-12-12 (×3): qty 4

## 2022-12-12 MED ORDER — MIRTAZAPINE 15 MG PO TABS
7.5000 mg | ORAL_TABLET | Freq: Every day | ORAL | Status: DC
  Administered 2022-12-12 – 2022-12-13 (×2): 7.5 mg via ORAL
  Filled 2022-12-12 (×2): qty 1

## 2022-12-12 MED ORDER — DEXAMETHASONE 0.5 MG PO TABS
0.5000 mg | ORAL_TABLET | Freq: Every day | ORAL | Status: DC
  Administered 2022-12-12 – 2022-12-14 (×3): 0.5 mg via ORAL
  Filled 2022-12-12 (×3): qty 1

## 2022-12-12 MED ORDER — ADULT MULTIVITAMIN W/MINERALS CH
1.0000 | ORAL_TABLET | Freq: Every day | ORAL | Status: DC
  Administered 2022-12-13 – 2022-12-14 (×2): 1 via ORAL
  Filled 2022-12-12 (×2): qty 1

## 2022-12-12 MED ORDER — IPRATROPIUM-ALBUTEROL 0.5-2.5 (3) MG/3ML IN SOLN
3.0000 mL | Freq: Four times a day (QID) | RESPIRATORY_TRACT | Status: DC
  Administered 2022-12-12 – 2022-12-13 (×6): 3 mL via RESPIRATORY_TRACT
  Filled 2022-12-12 (×6): qty 3

## 2022-12-12 MED ORDER — SODIUM CHLORIDE 0.9% FLUSH
3.0000 mL | Freq: Two times a day (BID) | INTRAVENOUS | Status: DC
  Administered 2022-12-12 – 2022-12-14 (×5): 3 mL via INTRAVENOUS

## 2022-12-12 MED ORDER — SODIUM CHLORIDE 0.9% FLUSH: 3.0000 mL | INTRAVENOUS | Status: DC | PRN

## 2022-12-12 MED ORDER — TRAMADOL HCL 50 MG PO TABS: 50.0000 mg | ORAL_TABLET | Freq: Four times a day (QID) | ORAL | Status: DC | PRN

## 2022-12-12 NOTE — ED Triage Notes (Signed)
Pt to triage via w/c, congested cough noted; reports hx COPD; increasing SHOB this morning with prod cough clear sputum

## 2022-12-12 NOTE — Progress Notes (Signed)
*  PRELIMINARY RESULTS* Echocardiogram 2D Echocardiogram has been performed.  Carolyne Fiscal 12/12/2022, 12:11 PM

## 2022-12-12 NOTE — Evaluation (Signed)
Physical Therapy Evaluation Patient Details Name: [name] MRN: [id number] DOB: 1945-05-04 Today's Date: 12/12/2022  History of Present Illness  Patient is a 77 year old male with medical history significant of COPD Gold stage II, chronic HFrEF with LVEF 30-35%, anxiety/depression, CAD status post stenting, GERD, presented with new onset of productive cough shortness of breath and wheezing.   Clinical Impression  Patient is agreeable to PT evaluation. He reports he lives alone and uses a rolling walker for ambulation at baseline. No recent falls reported.  Contact guard assistance provided for hallway ambulation using rolling walker. Patient is fatigued with activity. Sp02 at 87% on room air and up to 91% while walking with 2 L02. Mild dyspnea noted. The patient is not at his baseline level of functional independence and would benefit from continued PT to maximize independence and facilitate return to prior level of function.  He is hopeful to return home at discharge.       If plan is discharge home, recommend the following: Assistance with cooking/housework;Help with stairs or ramp for entrance;Assist for transportation   Can travel by private vehicle        Equipment Recommendations None recommended by PT  Recommendations for Other Services       Functional Status Assessment Patient has had a recent decline in their functional status and demonstrates the ability to make significant improvements in function in a reasonable and predictable amount of time.     Precautions / Restrictions Precautions Precautions: Fall Restrictions Weight Bearing Restrictions: No      Mobility  Bed Mobility Overal bed mobility: Needs Assistance Bed Mobility: Supine to Sit, Sit to Supine     Supine to sit: HOB elevated, Supervision Sit to supine: Supervision, HOB elevated   General bed mobility comments: increased time required, no physical assistance needed    Transfers Overall  transfer level: Needs assistance Equipment used: Rolling walker (2 wheels) Transfers: Sit to/from Stand Sit to Stand: Contact guard assist           General transfer comment: increased time required    Ambulation/Gait Ambulation/Gait assistance: Contact guard assist Gait Distance (Feet): 50 Feet Assistive device: Rolling walker (2 wheels) Gait Pattern/deviations: Trunk flexed, Decreased step length - right, Decreased step length - left Gait velocity: decreased     General Gait Details: one standing rest break with hallway ambulation. Sp02 down to 87% on room air and up to 91% on 2 L02 with ambulation. activity tolerance limited by fatigue  Stairs            Wheelchair Mobility     Tilt Bed    Modified Rankin (Stroke Patients Only)       Balance Overall balance assessment: Needs assistance Sitting-balance support: Feet supported Sitting balance-Leahy Scale: Good     Standing balance support: Bilateral upper extremity supported, During functional activity, Reliant on assistive device for balance Standing balance-Leahy Scale: Fair                               Pertinent Vitals/Pain Pain Assessment Pain Assessment: No/denies pain    Home Living Family/patient expects to be discharged to:: Private residence Living Arrangements: Alone Available Help at Discharge: Family;Available PRN/intermittently (steps sons check on him daily) Type of Home: House Home Access: Stairs to enter   [employer] of Steps: 4   Home Layout: One level Home Equipment: Agricultural consultant (2 wheels) Additional Comments: step sons bring  food and drive to appointments if needed    Prior Function Prior Level of Function : Independent/Modified Independent             Mobility Comments: using rolling walker, no falls reported       Extremity/Trunk Assessment   Upper Extremity Assessment Upper Extremity Assessment: Overall WFL for tasks assessed     Lower Extremity Assessment Lower Extremity Assessment: Generalized weakness (endurance impaired for sustained activity in standing)       Communication   Communication Communication: No apparent difficulties  Cognition Arousal: Alert Behavior During Therapy: WFL for tasks assessed/performed Overall Cognitive Status: Within Functional Limits for tasks assessed                                          General Comments      Exercises     Assessment/Plan    PT Assessment Patient needs continued PT services  PT Problem List Cardiopulmonary status limiting activity;Decreased activity tolerance;Decreased balance;Decreased mobility;Decreased strength       PT Treatment Interventions DME instruction;Gait training;Stair training;Functional mobility training;Therapeutic activities;Therapeutic exercise;Balance training;Neuromuscular re-education;Cognitive remediation;Patient/family education    PT Goals (Current goals can be found in the Care Plan section)  Acute Rehab PT Goals Patient Stated Goal: to go home PT Goal Formulation: With patient Time For Goal Achievement: 12/26/22 Potential to Achieve Goals: Good    Frequency Min 1X/week     Co-evaluation               AM-PAC PT "6 Clicks" Mobility  Outcome Measure Help needed turning from your back to your side while in a flat bed without using bedrails?: None Help needed moving from lying on your back to sitting on the side of a flat bed without using bedrails?: A Little Help needed moving to and from a bed to a chair (including a wheelchair)?: A Little Help needed standing up from a chair using your arms (e.g., wheelchair or bedside chair)?: A Little Help needed to walk in hospital room?: A Little Help needed climbing 3-5 steps with a railing? : A Little 6 Click Score: 19    End of Session Equipment Utilized During Treatment: Oxygen Activity Tolerance: Patient limited by fatigue Patient left: in  bed;with call bell/phone within reach Nurse Communication: Mobility status PT Visit Diagnosis: Unsteadiness on feet (R26.81);Muscle weakness (generalized) (M62.81)    Time: 8416-6063 PT Time Calculation (min) (ACUTE ONLY): 15 min   Charges:   PT Evaluation $PT Eval Low Complexity: 1 Low   PT General Charges $$ ACUTE PT VISIT: 1 Visit         Donna Bernard, PT, MPT   Ina Homes 12/12/2022, 12:42 PM

## 2022-12-12 NOTE — Progress Notes (Signed)
Initial Nutrition Assessment  DOCUMENTATION CODES:   Not applicable  INTERVENTION:   Ensure Enlive po TID, each supplement provides 350 kcal and 20 grams of protein.  MVI po daily   Liberalize diet   Daily weights   NUTRITION DIAGNOSIS:   Increased nutrient needs related to catabolic illness (COPD) as evidenced by estimated needs.  GOAL:   Patient will meet greater than or equal to 90% of their needs  MONITOR:   PO intake, Supplement acceptance, Weight trends, Labs, I & O's, Skin  REASON FOR ASSESSMENT:   Consult Assessment of nutrition requirement/status  ASSESSMENT:   77 y/o male with h/o COPD, CHF, HLD, HTN, CAD, GERD, Barrett's esophagus, AAA, SDH, prostate cancer and incarcerated history s/p hernia repair with small bowel resection (6.5cm) 2022 who is admitted with COPD and CHF exacerbation.  Met with pt in room today. Pt reports good appetite and oral intake at baseline and in hospital; pt reports eating 100% of meals. RD discussed with pt the importance of adequate nutrition needed to preserve lean muscle. Pt reports that he is willing to drink strawberry Ensure in hospital. RD will add supplements and MVI to help pt meet his estimated needs. Per chart, pt is up ~7lbs from his UBW.   Medications reviewed and include: aspirin, azithromycin, dexamethasone, lovenox, lasix, remeron, MVI, nicotine, protonix, aldactone  Labs reviewed: K 3.4(L) BNP- 926.2(H)  NUTRITION - FOCUSED PHYSICAL EXAM:  Flowsheet Row Most Recent Value  Orbital Region No depletion  Upper Arm Region Moderate depletion  Thoracic and Lumbar Region No depletion  Buccal Region No depletion  Temple Region Mild depletion  Clavicle Bone Region Mild depletion  Clavicle and Acromion Bone Region Mild depletion  Scapular Bone Region Moderate depletion  Dorsal Hand No depletion  Patellar Region Severe depletion  Anterior Thigh Region Severe depletion  Posterior Calf Region Severe depletion  Edema  (RD Assessment) None  Hair Reviewed  Eyes Reviewed  Mouth Reviewed  Skin Reviewed  Nails Reviewed   Diet Order:   Diet Order             Diet 2 gram sodium Room service appropriate? Yes; Fluid consistency: Thin; Fluid restriction: 2000 mL Fluid  Diet effective now                  EDUCATION NEEDS:   Education needs have been addressed  Skin:  Skin Assessment: Reviewed RN Assessment  Last BM:  8/25  Height:   Ht Readings from Last 1 Encounters:  12/12/22 6' (1.829 m)    Weight:   Wt Readings from Last 1 Encounters:  12/12/22 81.8 kg    Ideal Body Weight:  80.9 kg  BMI:  Body mass index is 24.47 kg/m.  Estimated Nutritional Needs:   Kcal:  2000-2300kcal/day  Protein:  100-115g/day  Fluid:  2.0-2.3L/day  Betsey Holiday MS, RD, LDN Please refer to [facility] for RD and/or RD on-call/weekend/after hours pager

## 2022-12-12 NOTE — H&P (Signed)
History and Physical    [name] IHK:[id number] DOB: [birth year] DOA: 12/12/2022  PCP: Danella Penton, MD (Confirm with patient/family/NH records and if not entered, this has to be entered at [facility] point of entry) Patient coming from: Home  I have personally briefly reviewed patient's old medical records in [facility]  Chief Complaint: Cough, shortness of breath  HPI: [name] is a 77 y.o. male with medical history significant of COPD Gold stage II, chronic HFrEF with LVEF 30-35%, anxiety/depression, CAD status post stenting, GERD, presented with new onset of productive cough shortness of breath and wheezing.  Symptoms started 3 days ago patient started to have a productive cough with whitish thin phlegm, wheezing and increasing exertional dyspnea.  Denies any chest pain no fever chills.  He reports that he has been compliant with all his medications, but he does continue to smoke.  ED Course: Hypoxic, desat to mid 80% on room air, stabilized on 2 L with O2 saturation 93%.  Blood pressure elevated as BP 140-150, afebrile.  Chest x-ray showed pulmonary congestion.  VBG showed 7.3 6/69/53.  WBC 10.5, hemoglobin 14.  Patient was given IV Decadron and breathing treatment in the ED.  Review of Systems: As per HPI otherwise 14 point review of systems negative.    Past Medical History:  Diagnosis Date   AAA (abdominal aortic aneurysm) (HCC)    a.  CTA 7/19: measured 4.5 x 5.3 cm and greatest transverse dimensions   Abnormal nuclear stress test    a.  Myoview 2012: anterior wall ischemia with an estimated EF of 26%.  This was a new wall motion abnormality as well as a newly reduced EF   Brain bleed (HCC)    COPD (chronic obstructive pulmonary disease) (HCC)    Coronary artery disease    a.  Posterior MI in 2002 status post BMS to the LCx; b. LHC 2012: 95% stenosis of the proximal LAD, 95% stenosis of the mid LAD, 30% in-stent restenosis of the mid left circumflex with a second  lesion of diffuse 50% stenosis.  The patient underwent successful PCI/BMS to the mid LAD with 0% residual stenosis, LV gram not performed   GERD (gastroesophageal reflux disease)    History of kidney stones    History of prostate cancer    Hyperlipidemia    Hypertension    Myocardial infarction [facility])    Neuromuscular disorder (HCC)    Prostate cancer (HCC)    a.  Status post seeding   PVD (peripheral vascular disease) (HCC)    Stroke [facility])     Past Surgical History:  Procedure Laterality Date   BACK SURGERY  2009/2010   x 2   BOWEL RESECTION N/A 03/18/2021   Procedure: SMALL BOWEL RESECTION;  Surgeon: Diamantina Monks, MD;  Location: [facility] OR;  Service: General;  Laterality: N/A;   CARDIAC CATHETERIZATION  2002   stent placement [place]   ENDOVASCULAR REPAIR/STENT GRAFT N/A 01/02/2018   Procedure: ENDOVASCULAR REPAIR/STENT GRAFT;  Surgeon: Annice Needy, MD;  Location: [facility] INVASIVE CV LAB;  Service: Cardiovascular;  Laterality: N/A;   FOOT SURGERY     bilateral    HERNIA REPAIR     bilateral    INGUINAL HERNIA REPAIR Left 03/18/2021   Procedure: INGUINAL HERNIA REPAIR;  Surgeon: Diamantina Monks, MD;  Location: [facility] OR;  Service: General;  Laterality: Left;   INSERTION OF MESH Left 03/18/2021   Procedure: INSERTION OF MESH;  Surgeon: Kris Mouton  N, MD;  Location: [facility] OR;  Service: General;  Laterality: Left;   KNEE SURGERY     right knee    PROSTATE SURGERY  09/2009   prostate implant   SPINE SURGERY     UMBILICAL HERNIA REPAIR N/A 03/18/2021   Procedure: UMBILICAL HERNIA REPAIR;  Surgeon: Diamantina Monks, MD;  Location: [facility] OR;  Service: General;  Laterality: N/A;     reports that he has been smoking cigarettes. He has a 14 pack-year smoking history. He has never used smokeless tobacco. He reports that he does not drink alcohol and does not use drugs.  Allergies  Allergen Reactions   Lyrica [Pregabalin] Swelling        Prednisone Other (See Comments)    Feels like having a  heart attack. Has had the injection form before and does well. Oral Steroids Feels like having a heart attack Feels like having a heart attack. Has had the injection form before and does well. Feels like having a heart attack. Has had the injection form before and does well.  Feels like having a heart attack. Has had the injection form before and does well. Oral Steroids    Family History  Problem Relation Age of Onset   Heart attack Mother    Diabetes Mother    Heart disease Father    Lung cancer Daughter      Prior to Admission medications   Medication Sig Start Date End Date Taking? Authorizing Provider  aspirin EC 81 MG tablet Take 81 mg by mouth daily.   Yes [provider]  atorvastatin (LIPITOR) 80 MG tablet Take 1 tablet (80 mg total) by mouth daily. 04/07/21  Yes Medina-Vargas, Monina C, NP  budesonide-formoterol (SYMBICORT) 160-4.5 MCG/ACT inhaler Inhale 2 puffs into the lungs 2 (two) times daily. 04/07/21  Yes Medina-Vargas, Monina C, NP  Calcium Carbonate (CALCIUM 500 PO) Take 500 mg by mouth daily.   Yes [provider]  carvedilol (COREG) 3.125 MG tablet Take 1 tablet (3.125 mg total) by mouth 2 (two) times daily. 04/07/21  Yes Medina-Vargas, Monina C, NP  fluticasone furoate-vilanterol (BREO ELLIPTA) 200-25 MCG/ACT AEPB Inhale 1 puff into the lungs daily. 06/21/22  Yes [provider]  furosemide (LASIX) 40 MG tablet Take 40 mg by mouth daily. 06/21/22 06/21/23 Yes [provider]  mirtazapine (REMERON) 7.5 MG tablet Take 7.5 mg by mouth at bedtime. 12/19/21 12/19/22 Yes [provider]  Multiple Vitamins-Minerals (MULTIVITAMIN WITH MINERALS) tablet Take 1 tablet by mouth daily.   Yes [provider]  omeprazole (PRILOSEC) 40 MG capsule TAKE 1 CAPSULE BY MOUTH DAILY USUALLY BEFORE BREAKFAST Patient taking differently: Take 40 mg by mouth daily. BEFORE BREAKFAST 04/07/21  Yes Medina-Vargas, Monina C, NP   rOPINIRole (REQUIP) 0.5 MG tablet Take 0.5 mg by mouth 3 (three) times daily.   Yes [provider]  sacubitril-valsartan (ENTRESTO) 97-103 MG Take 1 tablet by mouth 2 (two) times daily. 06/14/21  Yes Antonieta Iba, MD  spironolactone (ALDACTONE) 25 MG tablet Take 0.5 tablets (12.5 mg total) by mouth daily. FOR CHRONIC COMBINED CHF 04/28/21  Yes Elgergawy, Leana Roe, MD  valproic acid (DEPAKENE) 250 MG capsule Take 1 capsule (250 mg total) by mouth 2 (two) times daily. 04/07/21  Yes Medina-Vargas, Monina C, NP  acetaminophen (TYLENOL) 325 MG tablet Take 2 tablets (650 mg total) by mouth every 6 (six) hours as needed for mild pain (or Fever >/= 101). 04/28/21   Elgergawy, Leana Roe,  MD  albuterol (VENTOLIN HFA) 108 (90 Base) MCG/ACT inhaler Inhale 2 puffs into the lungs every 6 (six) hours as needed for wheezing. 04/07/21 11/30/24  Medina-Vargas, Monina C, NP  nicotine (NICODERM CQ - DOSED IN MG/24 HOURS) 21 mg/24hr patch Place 1 patch (21 mg total) onto the skin daily. Patient not taking: Reported on 04/24/2021 03/17/21   Love, Evlyn Kanner, PA-C  senna-docusate (SENOKOT-S) 8.6-50 MG tablet Take 2 tablets by mouth at bedtime. Patient not taking: Reported on 04/24/2021 03/16/21   Love, Evlyn Kanner, PA-C  traMADol (ULTRAM) 50 MG tablet Take 50 mg by mouth every 6 (six) hours as needed.    [provider]    Physical Exam: Vitals:   12/12/22 0730 12/12/22 0737 12/12/22 0800 12/12/22 0842  BP: (!) 140/70   128/68  Pulse: (!) 55 67  70  Resp: (!) 22 (!) 21  18  Temp:   98.1 F (36.7 C) 97.8 F (36.6 C)  TempSrc:   Oral   SpO2: 93% 94%  (!) 65%  Weight:      Height:        Constitutional: NAD, calm, comfortable Vitals:   12/12/22 0730 12/12/22 0737 12/12/22 0800 12/12/22 0842  BP: (!) 140/70   128/68  Pulse: (!) 55 67  70  Resp: (!) 22 (!) 21  18  Temp:   98.1 F (36.7 C) 97.8 F (36.6 C)  TempSrc:   Oral   SpO2: 93% 94%  (!) 65%  Weight:      Height:       Eyes: PERRL, lids  and conjunctivae normal ENMT: Mucous membranes are moist. Posterior pharynx clear of any exudate or lesions.Normal dentition.  Neck: normal, supple, no masses, no thyromegaly Respiratory: clear to auscultation bilaterally, scattered wheezing, fine crackles to the bilateral mid levels, increasing respiratory effort. No accessory muscle use.  Cardiovascular: Regular rate and rhythm, no murmurs / rubs / gallops. No extremity edema. 2+ pedal pulses. No carotid bruits.  Abdomen: no tenderness, no masses palpated. No hepatosplenomegaly. Bowel sounds positive.  Musculoskeletal: no clubbing / cyanosis. No joint deformity upper and lower extremities. Good ROM, no contractures. Normal muscle tone.  Skin: no rashes, lesions, ulcers. No induration Neurologic: CN 2-12 grossly intact. Sensation intact, DTR normal. Strength 5/5 in all 4.  Psychiatric: Normal judgment and insight. Alert and oriented x 3. Normal mood.     Labs on Admission: I have personally reviewed following labs and imaging studies  CBC: Recent Labs  Lab 12/12/22 0616  WBC 10.5  HGB 14.3  HCT 44.4  MCV 98.9  PLT 172   Basic Metabolic Panel: Recent Labs  Lab 12/12/22 0616  NA 145  K 3.4*  CL 107  CO2 33*  GLUCOSE 116*  BUN 19  CREATININE 1.09  CALCIUM 8.3*   GFR: Estimated Creatinine Clearance: 60.3 mL/min (by C-G formula based on SCr of 1.09 mg/dL). Liver Function Tests: No results for input(s): "AST", "ALT", "ALKPHOS", "BILITOT", "PROT", "ALBUMIN" in the last 168 hours. No results for input(s): "LIPASE", "AMYLASE" in the last 168 hours. No results for input(s): "AMMONIA" in the last 168 hours. Coagulation Profile: No results for input(s): "INR", "PROTIME" in the last 168 hours. Cardiac Enzymes: No results for input(s): "CKTOTAL", "CKMB", "CKMBINDEX", "TROPONINI" in the last 168 hours. BNP (last 3 results) No results for input(s): "PROBNP" in the last 8760 hours. HbA1C: No results for input(s): "HGBA1C" in the  last 72 hours. CBG: No results for input(s): "GLUCAP" in the last  168 hours. Lipid Profile: No results for input(s): "CHOL", "HDL", "LDLCALC", "TRIG", "CHOLHDL", "LDLDIRECT" in the last 72 hours. Thyroid Function Tests: No results for input(s): "TSH", "T4TOTAL", "FREET4", "T3FREE", "THYROIDAB" in the last 72 hours. Anemia Panel: No results for input(s): "VITAMINB12", "FOLATE", "FERRITIN", "TIBC", "IRON", "RETICCTPCT" in the last 72 hours. Urine analysis:    Component Value Date/Time   COLORURINE YELLOW (A) 02/21/2021 0641   APPEARANCEUR HAZY (A) 02/21/2021 0641   APPEARANCEUR Cloudy 04/21/2011 1418   LABSPEC 1.021 02/21/2021 0641   LABSPEC 1.013 04/21/2011 1418   PHURINE 5.0 02/21/2021 0641   GLUCOSEU NEGATIVE 02/21/2021 0641   GLUCOSEU Negative 04/21/2011 1418   HGBUR MODERATE (A) 02/21/2021 0641   BILIRUBINUR NEGATIVE 02/21/2021 0641   BILIRUBINUR Negative 04/21/2011 1418   KETONESUR 5 (A) 02/21/2021 0641   PROTEINUR 30 (A) 02/21/2021 0641   NITRITE POSITIVE (A) 02/21/2021 0641   LEUKOCYTESUR MODERATE (A) 02/21/2021 0641   LEUKOCYTESUR Negative 04/21/2011 1418    Radiological Exams on Admission: DG Chest Portable 1 View  Result Date: 12/12/2022 CLINICAL DATA:  Congestion, cough and SOB. History of COPD. Smoker. EXAM: PORTABLE CHEST 1 VIEW COMPARISON:  04/24/2021 FINDINGS: Stable cardiac enlargement. Aortic calcifications and tortuosity. No pleural fluid, interstitial edema, or airspace consolidation. Pulmonary vascular congestion without overt edema. IMPRESSION: Cardiomegaly and pulmonary vascular congestion. Aortic Atherosclerosis (ICD10-I70.0). Electronically Signed   By: Signa Kell M.D.   On: 12/12/2022 06:41    EKG: Sinus, no acute ST changes.  Assessment/Plan Active Problems:   COPD exacerbation (HCC)   Acute on chronic combined systolic and diastolic CHF (congestive heart failure) (HCC)   COPD (chronic obstructive pulmonary disease) (HCC)  (please populate well  all problems here in Problem List. (For example, if patient is on BP meds at home and you resume or decide to hold them, it is a problem that needs to be her. Same for CAD, COPD, HLD and so on)  Acute hypoxic respiratory failure -Multifactorial, likely combination of acute systolic CHF decompensation plus acute COPD exacerbation -Start IV Lasix 40 mg twice daily, repeat chest x-ray tomorrow, monitor kidney function -Echocardiogram -Resume home BP meds including Entresto and spironolactone and Coreg -Daily I and O's and daily weight  Acute on chronic HFrEF decompensation -As above.  Acute COPD exacerbation Acute hypoxia Chronic compensated hypercapnia -Patient allergic to prednisone, but tolerated IV Decadron in the ED.  Plan to continue p.o. Decadron for 5 days. -Breathing treatment DuoNebs, ICS and LABA -Incentive spirometry and flutter valve -Full course of Zithromax -Culture sputum  HTN -As above  Cigarette smoking -Cessation education performed at bedside -Nicotine patch   DVT prophylaxis: Lovenox Code Status: Full Code Family Communication: None at bedside Disposition Plan: Expect less than 2 midnight hospital stay Consults called: None Admission status: Tele obs   Emeline General MD [facility] Pager [phone]  12/12/2022, 9:17 AM

## 2022-12-12 NOTE — ED Provider Notes (Addendum)
[facility] Provider Note    Event Date/Time   First MD Initiated Contact with Patient 12/12/22 [phone]     (approximate)   History   Shortness of Breath   HPI  [name] is a 77 y.o. male   Past medical history of COPD not on home oxygen, current smoker, hypertension hyperlipidemia, CAD with CHF, presents to the emerged apartment with 2 to 3 days of cough with productive sputum, shortness of breath, myalgias.  Denies fever or chills.  Denies chest pain.  Denies GI or GU complaints.  He has no weight gain or swelling.  Independent Historian contributed to assessment above: His son is at bedside to corroborate information past medical history as above.  External Medical Documents Reviewed: Cardiology note from September 2023 documenting past medical history, most recent echo with decreased systolic ejection fraction 35%.      Physical Exam   Triage Vital Signs: ED Triage Vitals [12/12/22 0604]  Encounter Vitals Group     BP      Systolic BP Percentile      Diastolic BP Percentile      Pulse      Resp      Temp      Temp src      SpO2      Weight 190 lb (86.2 kg)     Height 6' (1.829 m)     Head Circumference      Peak Flow      Pain Score 0     Pain Loc      Pain Education      Exclude from Growth Chart     Most recent vital signs: Vitals:   12/12/22 0615 12/12/22 0630  BP: (!) 142/75 (!) 159/87  Pulse: 90 75  Resp: (!) 21 (!) 22  SpO2: 90% 95%    General: Awake, no distress.  CV:  Good peripheral perfusion.  Resp:  Normal effort.  Abd:  No distention.  Other:  Awake alert comfortable appearing not in respiratory distress, nontoxic appearance pleasant gentleman.  Lung sounds distant in both lung fields with some scant wheezing but no focality.  Appears euvolemic with no peripheral edema noted.  Soft nontender abdomen.  Productive sounding cough occasionally , oxygen saturation ranging from 89 to 92% on room air.   ED Results /  Procedures / Treatments   Labs (all labs ordered are listed, but only abnormal results are displayed) Labs Reviewed  BLOOD GAS, VENOUS - Abnormal; Notable for the following components:      Result Value   pCO2, Ven 69 (*)    pO2, Ven 53 (*)    Bicarbonate 39.0 (*)    Acid-Base Excess 10.6 (*)    All other components within normal limits  RESP PANEL BY RT-PCR (RSV, FLU A&B, COVID)  RVPGX2  CBC  BASIC METABOLIC PANEL  BRAIN NATRIURETIC PEPTIDE  TROPONIN I (HIGH SENSITIVITY)   I independently reviewed laboratory results significant for mild hypercapnia, normal pH, likely chronic retention.  I independently reviewed and interpreted chest x-ray and see no obvious focality or pneumothorax.  EKG  ED ECG REPORT I, Pilar Jarvis, the attending physician, personally viewed and interpreted this ECG.   Date: 12/12/2022  EKG Time: 0614  Rate: 88  Rhythm: sinus  Axis: nl  Intervals:none  ST&T Change: No STEMI, frequent PVC     PROCEDURES:  Critical Care performed: Yes, see critical care procedure note(s)  .Critical Care  Performed by:  Pilar Jarvis, MD Authorized by: Pilar Jarvis, MD   Critical care provider statement:    Critical care time (minutes):  30   Critical care was time spent personally by me on the following activities:  Development of treatment plan with patient or surrogate, discussions with consultants, evaluation of patient's response to treatment, examination of patient, ordering and review of laboratory studies, ordering and review of radiographic studies, ordering and performing treatments and interventions, pulse oximetry, re-evaluation of patient's condition and review of old charts    MEDICATIONS ORDERED IN ED: Medications  ipratropium-albuterol (DUONEB) 0.5-2.5 (3) MG/3ML nebulizer solution 9 mL (9 mLs Nebulization Given 12/12/22 0632)  dexamethasone (DECADRON) injection 10 mg (10 mg Intravenous Given 12/12/22 0631)     IMPRESSION / MDM / ASSESSMENT AND PLAN  / ED COURSE  I reviewed the triage vital signs and the nursing notes.                                Patient's presentation is most consistent with acute presentation with potential threat to life or bodily function.  Differential diagnosis includes, but is not limited to, COPD exacerbation, bacterial pneumonia, viral URI, CHF exacerbation, acute hypoxemic respiratory failure, ACS   The patient is on the cardiac monitor to evaluate for evidence of arrhythmia and/or significant heart rate changes.  MDM:    Patient with COPD exacerbation with productive cough and wheezing.  Myalgias, but appears nontoxic doubt sepsis.  Will treat with DuoNeb and IV Decadron given his adverse reaction to oral prednisone per medical chart history reviewed.  Check chest x-ray for evidence of bacterial pneumonia.  Check basic labs, check troponins and EKG given his cardiac history shortness of breath for ACS.  His initial oxygen saturation ranged from 89 to 91% given his COPD, decided to keep him off of O2.  However then he desaturated to 84% without any changes in his clinical status, put on nasal cannula O2 w good response.   Feels subjectively better w nebs.   CXR w/o evidence pna.   Given new o2, COPD exacerbation, will admit.   -       FINAL CLINICAL IMPRESSION(S) / ED DIAGNOSES   Final diagnoses:  Acute exacerbation of chronic obstructive pulmonary disease (COPD) (HCC)  Acute hypoxemic respiratory failure (HCC)     Rx / DC Orders   ED Discharge Orders     None        Note:  This document was prepared using Dragon voice recognition software and may include unintentional dictation errors.    Pilar Jarvis, MD 12/12/22 Ulis Rias    Pilar Jarvis, MD 12/12/22 0981    Pilar Jarvis, MD 12/12/22 1914    Pilar Jarvis, MD 12/12/22 [phone]

## 2022-12-12 NOTE — Evaluation (Signed)
Occupational Therapy Evaluation Patient Details Name: [name] MRN: [id number] DOB: May 09, 1945 Today's Date: 12/12/2022   History of Present Illness Patient is a 77 year old male with medical history significant of COPD Gold stage II, chronic HFrEF with LVEF 30-35%, anxiety/depression, CAD status post stenting, GERD, presented with new onset of productive cough shortness of breath and wheezing.   Clinical Impression   Patient presenting with decreased Ind in self care,balance, functional mobility/transfers, endurance, and safety awareness. Patient reports living at home alone with use of RW for functional mobility. Pt's sons assist with meals and IADLs as needed. They drive him to appointments. Pt on RA during session with O2 saturation being 95% but pt reports feeling SOB and placed on 1 L via [place] for comfort. RN notified.  Patient currently functioning at supervision - CGA with use of RW for functional transfers, toileting needs, and LB clothing management. Pt returning to bed at end of session and RN arrived to do medications.  Patient will benefit from acute OT to increase overall independence in the areas of ADLs, functional mobility, and safety awareness in order to safely discharge.      If plan is discharge home, recommend the following: Assistance with cooking/housework;Assist for transportation;Help with stairs or ramp for entrance    Functional Status Assessment  Patient has had a recent decline in their functional status and demonstrates the ability to make significant improvements in function in a reasonable and predictable amount of time.  Equipment Recommendations  None recommended by OT       Precautions / Restrictions Precautions Precautions: Fall Restrictions Weight Bearing Restrictions: No      Mobility Bed Mobility Overal bed mobility: Needs Assistance Bed Mobility: Supine to Sit, Sit to Supine     Supine to sit: Supervision Sit to supine: Supervision         Transfers Overall transfer level: Needs assistance Equipment used: Rolling walker (2 wheels) Transfers: Sit to/from Stand Sit to Stand: Contact guard assist                  Balance Overall balance assessment: Needs assistance Sitting-balance support: Feet supported Sitting balance-Leahy Scale: Good     Standing balance support: Bilateral upper extremity supported, During functional activity, Reliant on assistive device for balance Standing balance-Leahy Scale: Fair                             ADL either performed or assessed with clinical judgement   ADL Overall ADL's : Needs assistance/impaired                         Toilet Transfer: Ambulance person;Ambulation;Rolling walker (2 wheels)   Toileting- Clothing Manipulation and Hygiene: Contact guard assist;Sit to/from stand               Vision Patient Visual Report: No change from baseline              Pertinent Vitals/Pain Pain Assessment Pain Assessment: No/denies pain     Extremity/Trunk Assessment Upper Extremity Assessment Upper Extremity Assessment: Overall WFL for tasks assessed   Lower Extremity Assessment Lower Extremity Assessment: Generalized weakness       Communication Communication Communication: No apparent difficulties   Cognition Arousal: Alert Behavior During Therapy: WFL for tasks assessed/performed Overall Cognitive Status: Within Functional Limits for tasks assessed  Home Living Family/patient expects to be discharged to:: Private residence Living Arrangements: Alone Available Help at Discharge: Family;Available PRN/intermittently Type of Home: House Home Access: Stairs to enter [employer] of Steps: 4   Home Layout: One level     Bathroom Shower/Tub: Producer, television/film/video: Standard     Home Equipment: Agricultural consultant (2  wheels)   Additional Comments: step sons bring food and drive to appointments if needed      Prior Functioning/Environment Prior Level of Function : Independent/Modified Independent             Mobility Comments: using rolling walker, no falls reported ADLs Comments: Pt uses RW for functional mobility and sons bring meals and assist with IADLs as needed        OT Problem List: Decreased strength;Decreased activity tolerance;Decreased safety awareness;Impaired balance (sitting and/or standing);Decreased knowledge of use of DME or AE      OT Treatment/Interventions: Self-care/ADL training;Therapeutic exercise;Therapeutic activities;Energy conservation;DME and/or AE instruction;Patient/family education;Balance training    OT Goals(Current goals can be found in the care plan section) Acute Rehab OT Goals Patient Stated Goal: to go home OT Goal Formulation: With patient Time For Goal Achievement: 12/26/22 Potential to Achieve Goals: Fair ADL Goals Pt Will Perform Grooming: with modified independence;standing Pt Will Perform Lower Body Dressing: with modified independence;sit to/from stand Pt Will Transfer to Toilet: with modified independence;ambulating Pt Will Perform Toileting - Clothing Manipulation and hygiene: with modified independence;sit to/from stand  OT Frequency: Min 1X/week       AM-PAC OT "6 Clicks" Daily Activity     Outcome Measure Help from another person eating meals?: None Help from another person taking care of personal grooming?: None Help from another person toileting, which includes using toliet, bedpan, or urinal?: A Little Help from another person bathing (including washing, rinsing, drying)?: A Little Help from another person to put on and taking off regular upper body clothing?: None Help from another person to put on and taking off regular lower body clothing?: A Little 6 Click Score: 21   End of Session Equipment Utilized During Treatment: Rolling  walker (2 wheels) Nurse Communication: Mobility status;Other (comment) (placed on 1 L O2 via [place] and meds at bedside in cup)  Activity Tolerance: Patient tolerated treatment well Patient left: in bed;with call bell/phone within reach;with bed alarm set  OT Visit Diagnosis: Unsteadiness on feet (R26.81);Muscle weakness (generalized) (M62.81)                Time: 1610-9604 OT Time Calculation (min): 13 min Charges:  OT General Charges $OT Visit: 1 Visit OT Evaluation $OT Eval Low Complexity: 1 Low  Jackquline Denmark, MS, OTR/L , CBIS ascom [phone]  12/12/22, 1:54 PM

## 2022-12-13 ENCOUNTER — Other Ambulatory Visit (HOSPITAL_COMMUNITY): Payer: Self-pay

## 2022-12-13 ENCOUNTER — Observation Stay: Payer: PPO

## 2022-12-13 DIAGNOSIS — I700 Atherosclerosis of aorta: Secondary | ICD-10-CM | POA: Diagnosis not present

## 2022-12-13 DIAGNOSIS — Z79899 Other long term (current) drug therapy: Secondary | ICD-10-CM | POA: Diagnosis not present

## 2022-12-13 DIAGNOSIS — J9601 Acute respiratory failure with hypoxia: Secondary | ICD-10-CM | POA: Diagnosis present

## 2022-12-13 DIAGNOSIS — K219 Gastro-esophageal reflux disease without esophagitis: Secondary | ICD-10-CM | POA: Diagnosis not present

## 2022-12-13 DIAGNOSIS — E785 Hyperlipidemia, unspecified: Secondary | ICD-10-CM | POA: Diagnosis not present

## 2022-12-13 DIAGNOSIS — J441 Chronic obstructive pulmonary disease with (acute) exacerbation: Secondary | ICD-10-CM | POA: Diagnosis not present

## 2022-12-13 DIAGNOSIS — Z1152 Encounter for screening for COVID-19: Secondary | ICD-10-CM | POA: Diagnosis not present

## 2022-12-13 DIAGNOSIS — E876 Hypokalemia: Secondary | ICD-10-CM | POA: Diagnosis not present

## 2022-12-13 DIAGNOSIS — J811 Chronic pulmonary edema: Secondary | ICD-10-CM | POA: Diagnosis not present

## 2022-12-13 DIAGNOSIS — Z87442 Personal history of urinary calculi: Secondary | ICD-10-CM | POA: Diagnosis not present

## 2022-12-13 DIAGNOSIS — I739 Peripheral vascular disease, unspecified: Secondary | ICD-10-CM | POA: Diagnosis not present

## 2022-12-13 DIAGNOSIS — Z8673 Personal history of transient ischemic attack (TIA), and cerebral infarction without residual deficits: Secondary | ICD-10-CM | POA: Diagnosis not present

## 2022-12-13 DIAGNOSIS — J449 Chronic obstructive pulmonary disease, unspecified: Secondary | ICD-10-CM | POA: Diagnosis not present

## 2022-12-13 DIAGNOSIS — Z801 Family history of malignant neoplasm of trachea, bronchus and lung: Secondary | ICD-10-CM | POA: Diagnosis not present

## 2022-12-13 DIAGNOSIS — I5043 Acute on chronic combined systolic (congestive) and diastolic (congestive) heart failure: Secondary | ICD-10-CM | POA: Diagnosis not present

## 2022-12-13 DIAGNOSIS — I2510 Atherosclerotic heart disease of native coronary artery without angina pectoris: Secondary | ICD-10-CM | POA: Diagnosis not present

## 2022-12-13 DIAGNOSIS — J90 Pleural effusion, not elsewhere classified: Secondary | ICD-10-CM | POA: Diagnosis not present

## 2022-12-13 DIAGNOSIS — Z8546 Personal history of malignant neoplasm of prostate: Secondary | ICD-10-CM | POA: Diagnosis not present

## 2022-12-13 DIAGNOSIS — I714 Abdominal aortic aneurysm, without rupture, unspecified: Secondary | ICD-10-CM | POA: Diagnosis not present

## 2022-12-13 DIAGNOSIS — Z888 Allergy status to other drugs, medicaments and biological substances status: Secondary | ICD-10-CM | POA: Diagnosis not present

## 2022-12-13 DIAGNOSIS — R0602 Shortness of breath: Secondary | ICD-10-CM | POA: Diagnosis not present

## 2022-12-13 DIAGNOSIS — F17210 Nicotine dependence, cigarettes, uncomplicated: Secondary | ICD-10-CM | POA: Diagnosis not present

## 2022-12-13 DIAGNOSIS — T501X5A Adverse effect of loop [high-ceiling] diuretics, initial encounter: Secondary | ICD-10-CM | POA: Diagnosis not present

## 2022-12-13 DIAGNOSIS — R918 Other nonspecific abnormal finding of lung field: Secondary | ICD-10-CM | POA: Diagnosis not present

## 2022-12-13 DIAGNOSIS — I252 Old myocardial infarction: Secondary | ICD-10-CM | POA: Diagnosis not present

## 2022-12-13 DIAGNOSIS — J9622 Acute and chronic respiratory failure with hypercapnia: Secondary | ICD-10-CM | POA: Diagnosis present

## 2022-12-13 DIAGNOSIS — J9621 Acute and chronic respiratory failure with hypoxia: Secondary | ICD-10-CM | POA: Diagnosis present

## 2022-12-13 DIAGNOSIS — Z955 Presence of coronary angioplasty implant and graft: Secondary | ICD-10-CM | POA: Diagnosis not present

## 2022-12-13 DIAGNOSIS — I110 Hypertensive heart disease with heart failure: Secondary | ICD-10-CM | POA: Diagnosis not present

## 2022-12-13 DIAGNOSIS — Z7951 Long term (current) use of inhaled steroids: Secondary | ICD-10-CM | POA: Diagnosis not present

## 2022-12-13 DIAGNOSIS — Z7902 Long term (current) use of antithrombotics/antiplatelets: Secondary | ICD-10-CM | POA: Diagnosis not present

## 2022-12-13 DIAGNOSIS — Z7982 Long term (current) use of aspirin: Secondary | ICD-10-CM | POA: Diagnosis not present

## 2022-12-13 LAB — BASIC METABOLIC PANEL
Anion gap: 7 (ref 5–15)
BUN: 21 mg/dL (ref 8–23)
CO2: 33 mmol/L — ABNORMAL HIGH (ref 22–32)
Calcium: 8.5 mg/dL — ABNORMAL LOW (ref 8.9–10.3)
Chloride: 104 mmol/L (ref 98–111)
Creatinine, Ser: 1.18 mg/dL (ref 0.61–1.24)
GFR, Estimated: >60 mL/min (ref >60)
Glucose, Bld: 131 mg/dL — ABNORMAL HIGH (ref 70–99)
Potassium: 3.3 mmol/L — ABNORMAL LOW (ref 3.5–5.1)
Sodium: 144 mmol/L (ref 135–145)

## 2022-12-13 MED ORDER — POTASSIUM CHLORIDE CRYS ER 20 MEQ PO TBCR
40.0000 meq | EXTENDED_RELEASE_TABLET | Freq: Once | ORAL | Status: AC
  Administered 2022-12-13: 40 meq via ORAL
  Filled 2022-12-13: qty 2

## 2022-12-13 MED ORDER — IPRATROPIUM-ALBUTEROL 0.5-2.5 (3) MG/3ML IN SOLN
3.0000 mL | Freq: Three times a day (TID) | RESPIRATORY_TRACT | Status: DC
  Administered 2022-12-13 – 2022-12-14 (×3): 3 mL via RESPIRATORY_TRACT
  Filled 2022-12-13 (×2): qty 3

## 2022-12-13 NOTE — Plan of Care (Signed)

## 2022-12-13 NOTE — TOC Benefit Eligibility Note (Signed)
Patient Product/process development scientist completed.    The patient is insured through [employer]/ [employer]. Patient has Medicare and is not eligible for a copay card, but may be able to apply for patient assistance, if available.    Ran test claim for Entresto 24-26 mg and the current 30 day co-pay is $47.00.  Ran test claim for Farxiga 10 mg and the current 30 day co-pay is $47.00.  Ran test claim for Jardiance 10 mg and the current 30 day co-pay is $47.00.   This test claim was processed through [facility]- copay amounts may vary at other pharmacies due to pharmacy/plan contracts, or as the patient moves through the different stages of their insurance plan.     Roland Earl, CPHT Pharmacy Technician III Certified Patient Advocate [facility] Patient Advocate Team Direct Number: [phone]  Fax: [phone]

## 2022-12-13 NOTE — Progress Notes (Signed)
[facility] HF Stewardship  PCP: Nathaniel Penton, MD  PCP-Cardiologist: Nathaniel Nordmann, MD  HPI: [name] is a 77 y.o. male with COPD Gold stage II, chronic HFrEF, anxiety/depression, intraparenchymal hemorrhage, SDH, CAD status post stenting, GERD  who presented with new onset of productive cough, shortness of breath, and wheezing x 3 days prior to admission. Etiology of symptoms suspected due to a combination of COPD and CHF. Troponin on admission was 20 and BNP was elevated at 926.2.  Pertinent cardiac history: Cardiac catheterization in 2012 showed 95% stenosis of proximal LAD treated with PCI. Nuclear stress test in 2019 showed stress EF of 16%. TTE the following day showed LVEF of 30-35% with grade I diastolic dysfunction and patient was started on GDMT. Underwent AAA repair in 2019. TTE in 10/2020 showed LVEF of 30-35% with grade I diastolic dysfunction and RV systolic function low normal. TTE in 02/2021 showed improved LVEF to 40-45% with normal RV function. TTE in 04/2021 showed LVEF reduced again to 30-35%. Most recent TTE this admission showed LVEF 40-45% with grade I diastolic dysfunction with mild mitral regurgitation.  Pertinent Lab Values: Creat  Date Value Ref Range Status  10/03/2011 1.12 0.50 - 1.35 mg/dL Final   Creatinine, Ser  Date Value Ref Range Status  12/13/2022 1.18 0.61 - 1.24 mg/dL Final   BUN  Date Value Ref Range Status  12/13/2022 21 8 - 23 mg/dL Final  56/38/7564 10 4 - 21 Final  04/23/2011 16 7 - 18 mg/dL Final   Potassium  Date Value Ref Range Status  12/13/2022 3.3 (L) 3.5 - 5.1 mmol/L Final  04/23/2011 3.9 3.5 - 5.1 mmol/L Final   Sodium  Date Value Ref Range Status  12/13/2022 144 135 - 145 mmol/L Final  04/04/2021 148 (A) 137 - 147 Final  04/23/2011 145 136 - 145 mmol/L Final   B Natriuretic Peptide  Date Value Ref Range Status  12/12/2022 926.2 (H) 0.0 - 100.0 pg/mL Final    Comment:    Performed at [facility], [address] [place], [place] [address]   Magnesium  Date Value Ref Range Status  04/25/2021 1.9 1.7 - 2.4 mg/dL Final    Comment:    Performed at [facility], [address], [place], [place] [address]   Hgb A1c MFr Bld  Date Value Ref Range Status  02/21/2021 5.5 4.8 - 5.6 % Final    Comment:    (NOTE) Pre diabetes:          5.7%-6.4%  Diabetes:              >6.4%  Glycemic control for   <7.0% adults with diabetes    TSH  Date Value Ref Range Status  06/08/2015 2.21 0.35 - 4.50 uIU/mL Final    Vital Signs: Temp:  [97.8 F (36.6 C)-98.6 F (37 C)] 97.8 F (36.6 C) (08/28 0740) Pulse Rate:  [53-70] 53 (08/28 0740) Cardiac Rhythm: Sinus bradycardia (08/27 2051) Resp:  [16-18] 16 (08/28 0740) BP: (128-138)/(59-78) 138/72 (08/28 0740) SpO2:  [65 %-95 %] 95 % (08/28 0740) Weight:  [81.8 kg (180 lb 6.4 oz)] 81.8 kg (180 lb 6.4 oz) (08/27 1502)   Intake/Output Summary (Last 24 hours) at 12/13/2022 0825 Last data filed at 12/13/2022 0430 Gross per 24 hour  Intake 240 ml  Output 0 ml  Net 240 ml    Current Inpatient Medications:  Loop Diuretic: Furosemide 40 mg IV BID Beta Blocker: Carvedilol 3.125 mg BID  Angiotensin Receptor/Neprilysin Inhibitor (ARNI): Entresto 97-103 mg BID Mineralocorticoid Receptor Antagonist (MRA): Spironolactone 12.5 mg daily     Prior to admission HF Medications:  Loop Diuretic: Furosemide 40 mg daily Beta Blocker: Carvedilol 3.125 mg BID Angiotensin Receptor/Neprilysin Inhibitor (ARNI): Entresto 97-103 mg BID Mineralocorticoid Receptor Antagonist (MRA): Spironolactone 12.5 mg daily  Assessment: 1. Combined systolic and diastolic chronic heart failure (LVEF 40-45%), due to ICM. NYHA class II symptoms.  -Receiving good GDMT at baseline. SBP 130s with HR 50s. BB dose limited by HR.  -Creatinine WNL. Hypokalemic. Can consider increasing spironolactone.  -BNP elevated on admission, however this can be falsely elevated by Entresto. No LEE  today. Respiratory symptoms improved with diuresis.   Plan: 1) Medication changes recommended at this time: -Consider increasing spironolactone to 25 mg daily  2) Patient assistance: Copay checks for: [name], Jardiance, each $47  3) Education: -- Patient has been educated on current HF medications and potential additions to HF medication regimen - Patient verbalizes understanding that over the next few months, these medication doses may change and more medications may be added to optimize HF regimen - Patient has been educated on basic disease state pathophysiology and goals of therapy  Medication Assistance / Insurance Benefits Check:  Does the patient have prescription insurance? Prescription Insurance: Medicare  Type of insurance plan:   Does the patient qualify for medication assistance through manufacturers or grants? No   Outpatient Pharmacy:  Prior to admission outpatient pharmacy: [facility]     Thank you for involving pharmacy in this patient's care.  [name], PharmD, BCPS Phone - [phone] Clinical Pharmacist 12/13/2022 12:32 PM

## 2022-12-13 NOTE — Plan of Care (Signed)
  Problem: Clinical Measurements: Goal: Respiratory complications will improve Outcome: Progressing Goal: Cardiovascular complication will be avoided Outcome: Progressing   Problem: Activity: Goal: Risk for activity intolerance will decrease Outcome: Progressing   Problem: Coping: Goal: Level of anxiety will decrease Outcome: Progressing   Problem: Safety: Goal: Ability to remain free from injury will improve Outcome: Progressing   

## 2022-12-13 NOTE — Progress Notes (Signed)
Physical Therapy Treatment Patient Details Name: [name] MRN: [id number] DOB: [birth year] Today's Date: 12/13/2022   History of Present Illness Patient is a 77 year old male with medical history significant of COPD Gold stage II, chronic HFrEF with LVEF 30-35%, anxiety/depression, CAD status post stenting, GERD, presented with new onset of productive cough shortness of breath and wheezing.    PT Comments  Pt was sitting EOB with RN giving medications upon arrival. Pt is A and O x 4. Eager to DC home. Attempted wean form O2 however pt desaturates to 85% on rm air. Quickly recovers with 2 L o2 reapplied. Overall pt is progressing well. Recommend continued skilled PT at DC to maximize strength while improving dynamic balance and safety during ADLs.  DC recs remain appropriate.    If plan is discharge home, recommend the following: Assistance with cooking/housework;Help with stairs or ramp for entrance;Assist for transportation     Equipment Recommendations  None recommended by PT       Precautions / Restrictions Precautions Precautions: Fall Restrictions Weight Bearing Restrictions: No     Mobility  Bed Mobility Overal bed mobility: Needs Assistance Bed Mobility: Sit to Supine, Supine to Sit  Supine to sit: Supervision Sit to supine: Supervision     Transfers Overall transfer level: Needs assistance Equipment used: Rolling walker (2 wheels) Transfers: Sit to/from Stand Sit to Stand: Supervision   Ambulation/Gait Ambulation/Gait assistance: Supervision Gait Distance (Feet): 120 Feet Assistive device: Rolling walker (2 wheels) Gait Pattern/deviations: Trunk flexed, Decreased step length - right, Decreased step length - left Gait velocity: decreased  General Gait Details: Pt desaturated to 8% on rm air during ambulation. No LOB or safety concerns with use of RW    Balance Overall balance assessment: Needs assistance Sitting-balance support: Feet supported Sitting  balance-Leahy Scale: Good     Standing balance support: Bilateral upper extremity supported, During functional activity, Reliant on assistive device for balance Standing balance-Leahy Scale: Fair Standing balance comment: reliant on RW. Vcs for increased BOS during gait      Cognition Arousal: Alert Behavior During Therapy: WFL for tasks assessed/performed Overall Cognitive Status: Within Functional Limits for tasks assessed           PT Goals (current goals can now be found in the care plan section) Acute Rehab PT Goals Patient Stated Goal: to go home Progress towards PT goals: Progressing toward goals    Frequency    Min 1X/week       AM-PAC PT "6 Clicks" Mobility   Outcome Measure  Help needed turning from your back to your side while in a flat bed without using bedrails?: None Help needed moving from lying on your back to sitting on the side of a flat bed without using bedrails?: A Little Help needed moving to and from a bed to a chair (including a wheelchair)?: A Little Help needed standing up from a chair using your arms (e.g., wheelchair or bedside chair)?: A Little Help needed to walk in hospital room?: A Little Help needed climbing 3-5 steps with a railing? : A Little 6 Click Score: 19    End of Session Equipment Utilized During Treatment: Oxygen Activity Tolerance: Patient tolerated treatment well Patient left: in bed;with call bell/phone within reach Nurse Communication: Mobility status PT Visit Diagnosis: Unsteadiness on feet (R26.81);Muscle weakness (generalized) (M62.81)     Time: 4081-4481 PT Time Calculation (min) (ACUTE ONLY): 13 min  Charges:    $Gait Training: 8-22 mins PT General Charges $$  ACUTE PT VISIT: 1 Visit                    Jetta Lout PTA 12/13/22, 10:25 AM

## 2022-12-13 NOTE — Progress Notes (Signed)
Occupational Therapy Treatment Patient Details Name: [name] MRN: [id number] DOB: 23-Nov-1945 Today's Date: 12/13/2022   History of present illness Patient is a 77 year old male with medical history significant of COPD Gold stage II, chronic HFrEF with LVEF 30-35%, anxiety/depression, CAD status post stenting, GERD, presented with new onset of productive cough shortness of breath and wheezing.   OT comments  Pt. sitting at the EOB finishing his meal upon arrival. Pt. reports new onset of intermittent bilateral hand tremors, "shaky, jerky movements". Pt. nurse was notified. Pt. reports having had Restless leg issues in the past, but never in his UEs. Pt. requires CGA LE dressing. Pt. education was provided about energy conservation/work simplification techniques for ADLs, and IADLs. Pt. education was provided about PLB techniques. A visual handout was provided to the Pt.'s SpO2 91%, HR 62 bpms on 2LO2. Pt. continues to benefit from OT services for ADL training, A/E training, and pt. education about energy conservation/work simplification techniques, PLB techniques, home modification, and DME.       If plan is discharge home, recommend the following:  Assistance with cooking/housework;Assist for transportation;Help with stairs or ramp for entrance   Equipment Recommendations  None recommended by OT    Recommendations for Other Services      Precautions / Restrictions Precautions Precautions: Fall Restrictions Weight Bearing Restrictions: No       Mobility Bed Mobility   Bed Mobility: Sit to Supine       Sit to supine: Independent        Transfers                         Balance                                           ADL either performed or assessed with clinical judgement   ADL Overall ADL's : Needs assistance/impaired Eating/Feeding: Independent   Grooming: Independent           Upper Body Dressing : Independent   Lower  Body Dressing: Contact guard assist                      Extremity/Trunk Assessment Upper Extremity Assessment Upper Extremity Assessment: Overall WFL for tasks assessed            Vision  No change from baseline     Perception     Praxis      Cognition Arousal: Alert Behavior During Therapy: WFL for tasks assessed/performed Overall Cognitive Status: Within Functional Limits for tasks assessed                                          Exercises      Shoulder Instructions       General Comments      Pertinent Vitals/ Pain        No reports of pain  Home Living                                          Prior Functioning/Environment              Frequency  Min 1X/week        Progress Toward Goals  OT Goals(current goals can now be found in the care plan section)  Progress towards OT goals: Progressing toward goals  Acute Rehab OT Goals Patient Stated Goal: To return home Time For Goal Achievement: 12/26/22 Potential to Achieve Goals: Fair  Plan      Co-evaluation                 AM-PAC OT "6 Clicks" Daily Activity     Outcome Measure   Help from another person eating meals?: None Help from another person taking care of personal grooming?: None   Help from another person bathing (including washing, rinsing, drying)?: A Little Help from another person to put on and taking off regular upper body clothing?: None Help from another person to put on and taking off regular lower body clothing?: A Little 6 Click Score: 18    End of Session Equipment Utilized During Treatment: Rolling walker (2 wheels)  OT Visit Diagnosis: Unsteadiness on feet (R26.81);Muscle weakness (generalized) (M62.81)   Activity Tolerance Patient tolerated treatment well   Patient Left in bed;with call bell/phone within reach;with bed alarm set;with nursing/sitter in room   Nurse Communication          Time:  4782-9562 OT Time Calculation (min): 15 min  Charges: OT General Charges $OT Visit: 1 Visit OT Treatments $Self Care/Home Management : 8-22 mins  Olegario Messier, MS, OTR/L   Olegario Messier 12/13/2022, 9:23 AM

## 2022-12-13 NOTE — Progress Notes (Signed)
PROGRESS NOTE    [name]  ZOX:[id number] DOB: 15-Mar-1946 DOA: 12/12/2022 PCP: Danella Penton, MD    Brief Narrative:   77 y.o. male with medical history significant of COPD Gold stage II, chronic HFrEF with LVEF 30-35%, anxiety/depression, CAD status post stenting, GERD, presented with new onset of productive cough shortness of breath and wheezing    Assessment & Plan:   Principal Problem:   Acute hypoxic respiratory failure (HCC) Active Problems:   Acute exacerbation of chronic obstructive pulmonary disease (COPD) (HCC)   Acute on chronic combined systolic and diastolic CHF (congestive heart failure) (HCC)   COPD (chronic obstructive pulmonary disease) (HCC)   Acute hypoxemic respiratory failure (HCC)  Acute hypoxic respiratory failure -Multifactorial, likely combination of acute systolic CHF decompensation plus acute COPD exacerbation   Acute on chronic HFrEF decompensation -As above.  -Echocardiogram shows LVEF 40-45% -Continue IV Lasix 40 mg twice daily, repeat chest x-ray tomorrow, monitor kidney function -Continue home BP meds including Entresto and spironolactone and Coreg -Daily I and O's and daily weight Net IO Since Admission: 480 mL [12/13/22 1836]    Acute COPD exacerbation Acute hypoxia.  Still on 2 L oxygen by nasal cannula Chronic compensated hypercapnia -Patient allergic to prednisone, but tolerated IV Decadron in the ED.  continue oral steroids -Breathing treatment DuoNebs, ICS and LABA -Incentive spirometry and flutter valve -Full course of Zithromax -Pending Culture sputum  Hypokalemia Replete and recheck   HTN -Continue home BP meds including Entresto and spironolactone and Coreg   Cigarette smoking -Cessation education performed at bedside -Nicotine patch   DVT prophylaxis: Lovenox enoxaparin (LOVENOX) injection 40 mg Start: 12/12/22 1000     Code Status: Full Code Family Communication: (NO "discussed with patient") Disposition  Plan: Home with HH depending on clinical condition and improvement   Antimicrobials:  Azithromycin    Subjective:  Still sob (although improving) and hypoxic needing 2 liter O2.  Objective: Vitals:   12/13/22 0722 12/13/22 0740 12/13/22 1331 12/13/22 1516  BP:  138/72  (!) 123/55  Pulse:  (!) 53  (!) 51  Resp:  16  16  Temp:  97.8 F (36.6 C)  98.9 F (37.2 C)  TempSrc:      SpO2: 93% 95% 92% 91%  Weight:      Height:        Intake/Output Summary (Last 24 hours) at 12/13/2022 1829 Last data filed at 12/13/2022 1022 Gross per 24 hour  Intake 240 ml  Output 0 ml  Net 240 ml   Filed Weights   12/12/22 0604 12/12/22 1502  Weight: 86.2 kg 81.8 kg    Examination:  General exam: Appears calm and comfortable  Respiratory system: Wheezing and crackles at the bases b/l. Not using accessory muscles of respiration.  Cardiovascular system: S1 & S2 heard, RRR. No JVD, murmurs, rubs, gallops or clicks. No pedal edema. Gastrointestinal system: Abdomen is nondistended, soft and nontender. No organomegaly or masses felt. Normal bowel sounds heard. Central nervous system: Alert and oriented. No focal neurological deficits. Extremities: Symmetric 5 x 5 power. Skin: No rashes, lesions or ulcers Psychiatry: Judgement and insight appear normal. Mood & affect appropriate.     Data Reviewed: I have personally reviewed following labs and imaging studies  CBC: Recent Labs  Lab 12/12/22 0616  WBC 10.5  HGB 14.3  HCT 44.4  MCV 98.9  PLT 172   Basic Metabolic Panel: Recent Labs  Lab 12/12/22 0616 12/13/22 0303  NA 145 144  K 3.4* 3.3*  CL 107 104  CO2 33* 33*  GLUCOSE 116* 131*  BUN 19 21  CREATININE 1.09 1.18  CALCIUM 8.3* 8.5*     Recent Results (from the past 240 hour(s))  Resp panel by RT-PCR (RSV, Flu A&B, Covid) Anterior Nasal Swab     Status: None   Collection Time: 12/12/22  6:16 AM   Specimen: Anterior Nasal Swab  Result Value Ref Range Status   SARS  Coronavirus 2 by RT PCR NEGATIVE NEGATIVE Final    Comment: (NOTE) SARS-CoV-2 target nucleic acids are NOT DETECTED.  The SARS-CoV-2 RNA is generally detectable in upper respiratory specimens during the acute phase of infection. The lowest concentration of SARS-CoV-2 viral copies this assay can detect is 138 copies/mL. A negative result does not preclude SARS-Cov-2 infection and should not be used as the sole basis for treatment or other patient management decisions. A negative result may occur with  improper specimen collection/handling, submission of specimen other than nasopharyngeal swab, presence of viral mutation(s) within the areas targeted by this assay, and inadequate number of viral copies(<138 copies/mL). A negative result must be combined with clinical observations, patient history, and epidemiological information. The expected result is Negative.  Fact Sheet for Patients:  BloggerCourse.com  Fact Sheet for Healthcare Providers:  SeriousBroker.it  This test is no t yet approved or cleared by the [place] FDA and  has been authorized for detection and/or diagnosis of SARS-CoV-2 by FDA under an Emergency Use Authorization (EUA). This EUA will remain  in effect (meaning this test can be used) for the duration of the COVID-19 declaration under Section 564(b)(1) of the Act, 21 U.S.C.section 360bbb-3(b)(1), unless the authorization is terminated  or revoked sooner.       Influenza A by PCR NEGATIVE NEGATIVE Final   Influenza B by PCR NEGATIVE NEGATIVE Final    Comment: (NOTE) The Xpert Xpress SARS-CoV-2/FLU/RSV plus assay is intended as an aid in the diagnosis of influenza from Nasopharyngeal swab specimens and should not be used as a sole basis for treatment. Nasal washings and aspirates are unacceptable for Xpert Xpress SARS-CoV-2/FLU/RSV testing.  Fact Sheet for  Patients: BloggerCourse.com  Fact Sheet for Healthcare Providers: SeriousBroker.it  This test is not yet approved or cleared by the [place] FDA and has been authorized for detection and/or diagnosis of SARS-CoV-2 by FDA under an Emergency Use Authorization (EUA). This EUA will remain in effect (meaning this test can be used) for the duration of the COVID-19 declaration under Section 564(b)(1) of the Act, 21 U.S.C. section 360bbb-3(b)(1), unless the authorization is terminated or revoked.     Resp Syncytial Virus by PCR NEGATIVE NEGATIVE Final    Comment: (NOTE) Fact Sheet for Patients: BloggerCourse.com  Fact Sheet for Healthcare Providers: SeriousBroker.it  This test is not yet approved or cleared by the [place] FDA and has been authorized for detection and/or diagnosis of SARS-CoV-2 by FDA under an Emergency Use Authorization (EUA). This EUA will remain in effect (meaning this test can be used) for the duration of the COVID-19 declaration under Section 564(b)(1) of the Act, 21 U.S.C. section 360bbb-3(b)(1), unless the authorization is terminated or revoked.  Performed at [facility], [address] [place], [place] [address]   Expectorated Sputum Assessment w Gram Stain, Rflx to Resp Cult     Status: None   Collection Time: 12/12/22  8:28 PM   Specimen: Sputum  Result Value Ref Range Status   Specimen Description SPUTUM  Final  Special Requests NONE  Final   Sputum evaluation   Final    THIS SPECIMEN IS ACCEPTABLE FOR SPUTUM CULTURE Performed at [facility], [address] [place] [place], [place] [address]    Report Status 12/12/2022 FINAL  Final  Culture, Respiratory w Gram Stain     Status: None (Preliminary result)   Collection Time: 12/12/22  8:28 PM   Specimen: SPU  Result Value Ref Range Status   Specimen Description   Final     SPUTUM Performed at [facility], [address], [place], [place] [address]    Special Requests   Final    NONE Reflexed from [id number] Performed at [facility], [address] [place], [place] [address]    Gram Stain   Final    RARE WBC PRESENT, PREDOMINANTLY PMN FEW GRAM POSITIVE COCCI FEW GRAM NEGATIVE RODS Performed at [facility], [address], [place], [place] [address]    Culture PENDING  Incomplete   Report Status PENDING  Incomplete         Radiology Studies: DG Chest 1 View  Result Date: 12/13/2022 CLINICAL DATA:  CHF. Cough and shortness of breath. History of COPD. Smoker. EXAM: CHEST  1 VIEW COMPARISON:  12/12/2022 FINDINGS: Stable mild cardiac enlargement. Aortic tortuosity and atherosclerotic calcifications. New blunting of the left costophrenic angle concerning for pleural effusion. Mild diffuse increase interstitial markings noted bilaterally, similar. No superimposed airspace consolidation. IMPRESSION: 1. New left pleural effusion with similar mild diffuse increase interstitial markings compatible with pulmonary vascular congestion/mild edema. 2. Stable mild cardiac enlargement. Electronically Signed   By: Signa Kell M.D.   On: 12/13/2022 07:11   ECHOCARDIOGRAM COMPLETE  Result Date: 12/12/2022    ECHOCARDIOGRAM REPORT   Patient Name:   EDNA SPEARS Date of Exam: 12/12/2022 Medical Rec #:  [id number]      Height:       72.0 in Accession #:    [id number]     Weight:       190.0 lb Date of Birth:  02-21-1946      BSA:          2.085 m Patient Age:    77 years       BP:           128/68 mmHg Patient Gender: M              HR:           70 bpm. Exam Location:  [facility] Procedure: 2D Echo, Cardiac Doppler and Color Doppler Indications:     CHF  History:         Patient has prior history of Echocardiogram examinations, most                  recent 04/25/2021. CHF and Cardiomyopathy, CAD and Previous                  Myocardial Infarction, Stroke and  COPD; Risk                  Factors:Hypertension, Dyslipidemia and Current Smoker. AAA.  Sonographer:     Mikki Harbor Referring Phys:  [id number] Emeline General Diagnosing Phys: Yvonne Kendall MD  Sonographer Comments: Technically difficult study due to poor echo windows and suboptimal parasternal window. Image acquisition challenging due to respiratory motion and Image acquisition challenging due to COPD. IMPRESSIONS  1. Left ventricular ejection fraction, by estimation, is 40 to 45%. The left  ventricle has mildly decreased function. The left ventricle demonstrates global hypokinesis. There is moderate left ventricular hypertrophy. Left ventricular diastolic parameters are consistent with Grade I diastolic dysfunction (impaired relaxation).  2. Right ventricular systolic function is normal. The right ventricular size is mildly enlarged. There is normal pulmonary artery systolic pressure.  3. Left atrial size was mildly dilated.  4. Right atrial size was mildly dilated.  5. The mitral valve is grossly normal. Mild mitral valve regurgitation. No evidence of mitral stenosis.  6. The aortic valve was not well visualized. Aortic valve regurgitation is not visualized. No aortic stenosis is present.  7. Pulmonic valve regurgitation not well-assessed.  8. The inferior vena cava is normal in size with greater than 50% respiratory variability, suggesting right atrial pressure of 3 mmHg. FINDINGS  Left Ventricle: Left ventricular ejection fraction, by estimation, is 40 to 45%. The left ventricle has mildly decreased function. The left ventricle demonstrates global hypokinesis. The left ventricular internal cavity size was normal in size. There is  moderate left ventricular hypertrophy. Left ventricular diastolic parameters are consistent with Grade I diastolic dysfunction (impaired relaxation). Right Ventricle: The right ventricular size is mildly enlarged. No increase in right ventricular wall thickness. Right ventricular  systolic function is normal. There is normal pulmonary artery systolic pressure. The tricuspid regurgitant velocity is 2.62  m/s, and with an assumed right atrial pressure of 3 mmHg, the estimated right ventricular systolic pressure is 30.5 mmHg. Left Atrium: Left atrial size was mildly dilated. Right Atrium: Right atrial size was mildly dilated. Pericardium: There is no evidence of pericardial effusion. Mitral Valve: The mitral valve is grossly normal. Mild mitral valve regurgitation. No evidence of mitral valve stenosis. MV peak gradient, 6.8 mmHg. The mean mitral valve gradient is 2.0 mmHg. Tricuspid Valve: The tricuspid valve is normal in structure. Tricuspid valve regurgitation is mild. Aortic Valve: The aortic valve was not well visualized. Aortic valve regurgitation is not visualized. No aortic stenosis is present. Aortic valve mean gradient measures 4.0 mmHg. Aortic valve peak gradient measures 9.9 mmHg. Aortic valve area, by VTI measures 2.93 cm. Pulmonic Valve: The pulmonic valve was not well visualized. Pulmonic valve regurgitation not well-assessed. Aorta: The aortic root is normal in size and structure. Venous: The inferior vena cava is normal in size with greater than 50% respiratory variability, suggesting right atrial pressure of 3 mmHg. IAS/Shunts: No atrial level shunt detected by color flow Doppler.  LEFT VENTRICLE PLAX 2D LVIDd:         4.30 cm      Diastology LVIDs:         3.00 cm      LV e' medial:    5.44 cm/s LV PW:         1.40 cm      LV E/e' medial:  10.1 LV IVS:        1.30 cm      LV e' lateral:   8.27 cm/s LVOT diam:     2.00 cm      LV E/e' lateral: 6.7 LV SV:         99 LV SV Index:   48 LVOT Area:     3.14 cm  LV Volumes (MOD) LV vol d, MOD A2C: 161.0 ml LV vol d, MOD A4C: 170.0 ml LV vol s, MOD A2C: 87.6 ml LV vol s, MOD A4C: 99.6 ml LV SV MOD A2C:     73.4 ml LV SV MOD A4C:     170.0  ml LV SV MOD BP:      67.4 ml RIGHT VENTRICLE RV Basal diam:  3.75 cm RV Mid diam:    3.20 cm RV  S prime:     12.20 cm/s TAPSE (M-mode): 3.1 cm LEFT ATRIUM             Index        RIGHT ATRIUM           Index LA diam:        3.90 cm 1.87 cm/m   RA Area:     21.00 cm LA Vol (A2C):   73.5 ml 35.26 ml/m  RA Volume:   60.10 ml  28.83 ml/m LA Vol (A4C):   83.2 ml 39.91 ml/m LA Biplane Vol: 82.7 ml 39.67 ml/m  AORTIC VALVE AV Area (Vmax):    2.72 cm AV Area (Vmean):   2.77 cm AV Area (VTI):     2.93 cm AV Vmax:           157.00 cm/s AV Vmean:          95.400 cm/s AV VTI:            0.339 m AV Peak Grad:      9.9 mmHg AV Mean Grad:      4.0 mmHg LVOT Vmax:         136.00 cm/s LVOT Vmean:        84.100 cm/s LVOT VTI:          0.316 m LVOT/AV VTI ratio: 0.93  AORTA Ao Root diam: 3.70 cm MITRAL VALVE                TRICUSPID VALVE MV Area (PHT): 1.81 cm     TR Peak grad:   27.5 mmHg MV Area VTI:   2.09 cm     TR Vmax:        262.00 cm/s MV Peak grad:  6.8 mmHg MV Mean grad:  2.0 mmHg     SHUNTS MV Vmax:       1.30 m/s     Systemic VTI:  0.32 m MV Vmean:      61.4 cm/s    Systemic Diam: 2.00 cm MV Decel Time: 418 msec MV E velocity: 55.00 cm/s MV A velocity: 102.00 cm/s MV E/A ratio:  0.54 Cristal Deer End MD Electronically signed by Yvonne Kendall MD Signature Date/Time: 12/12/2022/2:53:37 PM    Final    DG Chest Portable 1 View  Result Date: 12/12/2022 CLINICAL DATA:  Congestion, cough and SOB. History of COPD. Smoker. EXAM: PORTABLE CHEST 1 VIEW COMPARISON:  04/24/2021 FINDINGS: Stable cardiac enlargement. Aortic calcifications and tortuosity. No pleural fluid, interstitial edema, or airspace consolidation. Pulmonary vascular congestion without overt edema. IMPRESSION: Cardiomegaly and pulmonary vascular congestion. Aortic Atherosclerosis (ICD10-I70.0). Electronically Signed   By: Signa Kell M.D.   On: 12/12/2022 06:41        Scheduled Meds:  aspirin EC  81 mg Oral Daily   atorvastatin  80 mg Oral Daily   azithromycin  500 mg Oral Daily   carvedilol  3.125 mg Oral BID   dexamethasone  0.5  mg Oral Daily   enoxaparin (LOVENOX) injection  40 mg Subcutaneous Q24H   feeding supplement  237 mL Oral TID BM   fluticasone furoate-vilanterol  1 puff Inhalation Daily   furosemide  40 mg Intravenous BID   ipratropium-albuterol  3 mL Nebulization TID   mirtazapine  7.5 mg Oral QHS   multivitamin with  minerals  1 tablet Oral Daily   nicotine  14 mg Transdermal Daily   pantoprazole  40 mg Oral Daily   rOPINIRole  0.5 mg Oral TID   sacubitril-valsartan  1 tablet Oral BID   sodium chloride flush  3 mL Intravenous Q12H   spironolactone  12.5 mg Oral Daily   valproic acid  250 mg Oral BID   Continuous Infusions:  sodium chloride       LOS: 0 days    Time spent: 35 mins    Delfino Lovett, MD [facility] Pager [phone]  If 7PM-7AM, please contact night-coverage [email or web address] 12/13/2022, 6:29 PM

## 2022-12-14 ENCOUNTER — Inpatient Hospital Stay: Payer: PPO

## 2022-12-14 DIAGNOSIS — J449 Chronic obstructive pulmonary disease, unspecified: Secondary | ICD-10-CM | POA: Diagnosis not present

## 2022-12-14 DIAGNOSIS — I5043 Acute on chronic combined systolic (congestive) and diastolic (congestive) heart failure: Secondary | ICD-10-CM | POA: Diagnosis not present

## 2022-12-14 DIAGNOSIS — J9601 Acute respiratory failure with hypoxia: Secondary | ICD-10-CM | POA: Diagnosis not present

## 2022-12-14 DIAGNOSIS — J441 Chronic obstructive pulmonary disease with (acute) exacerbation: Secondary | ICD-10-CM | POA: Diagnosis not present

## 2022-12-14 LAB — CBC
HCT: 43.1 % (ref 39.0–52.0)
Hemoglobin: 14.4 g/dL (ref 13.0–17.0)
MCH: 31.9 pg (ref 26.0–34.0)
MCHC: 33.4 g/dL (ref 30.0–36.0)
MCV: 95.6 fL (ref 80.0–100.0)
Platelets: 160 10*3/uL (ref 150–400)
RBC: 4.51 MIL/uL (ref 4.22–5.81)
RDW: 13.2 % (ref 11.5–15.5)
WBC: 8.6 10*3/uL (ref 4.0–10.5)
nRBC: 0 % (ref 0.0–0.2)

## 2022-12-14 LAB — BASIC METABOLIC PANEL
Anion gap: 12 (ref 5–15)
BUN: 30 mg/dL — ABNORMAL HIGH (ref 8–23)
CO2: 33 mmol/L — ABNORMAL HIGH (ref 22–32)
Calcium: 8.6 mg/dL — ABNORMAL LOW (ref 8.9–10.3)
Chloride: 98 mmol/L (ref 98–111)
Creatinine, Ser: 1.1 mg/dL (ref 0.61–1.24)
GFR, Estimated: >60 mL/min (ref >60)
Glucose, Bld: 85 mg/dL (ref 70–99)
Potassium: 3.5 mmol/L (ref 3.5–5.1)
Sodium: 143 mmol/L (ref 135–145)

## 2022-12-14 MED ORDER — DEXAMETHASONE 0.5 MG PO TABS: 0.5000 mg | ORAL_TABLET | Freq: Every day | ORAL | 0 refills | Status: DC

## 2022-12-14 MED ORDER — FUROSEMIDE 40 MG PO TABS: ORAL_TABLET | ORAL | 2 refills | Status: DC

## 2022-12-14 MED ORDER — DOXYCYCLINE HYCLATE 100 MG PO TABS: 100.0000 mg | ORAL_TABLET | Freq: Two times a day (BID) | ORAL | 0 refills | Status: AC

## 2022-12-14 MED ORDER — NICOTINE 14 MG/24HR TD PT24: 14.0000 mg | MEDICATED_PATCH | Freq: Every day | TRANSDERMAL | 0 refills | Status: DC

## 2022-12-14 NOTE — Progress Notes (Signed)
Patient and son was given written and verbal discharge instruction, acknowledge understanding and states he will comply and keep all appointments. Oxygen was delivered to room, patient and son was given a demonstration on how to use. Patient taken to car by wheelchair, on 2 liters of oxygen, no distress when leaving the floor.

## 2022-12-14 NOTE — Progress Notes (Signed)
Patient and son requested remove prednisone as an allergy.

## 2022-12-14 NOTE — Progress Notes (Signed)
SATURATION QUALIFICATIONS: (This note is used to comply with regulatory documentation for home oxygen)  Patient Saturations on Room Air at Rest = 90%  Patient Saturations on Room Air while Ambulating = 82%  Patient Saturations on 2 Liters of oxygen while Ambulating = 94%  Please briefly explain why patient needs home oxygen: 

## 2022-12-14 NOTE — Progress Notes (Signed)
[facility] HF Stewardship  PCP: Danella Penton, MD  PCP-Cardiologist: Julien Nordmann, MD  HPI: [name] is a 77 y.o. male with COPD Gold stage II, chronic HFrEF, anxiety/depression, intraparenchymal hemorrhage, SDH, CAD status post stenting, GERD  who presented with new onset of productive cough, shortness of breath, and wheezing x 3 days prior to admission. Etiology of symptoms suspected due to a combination of COPD and CHF. Troponin on admission was 20 and BNP was elevated at 926.2.  Pertinent cardiac history: Cardiac catheterization in 2012 showed 95% stenosis of proximal LAD treated with PCI. Nuclear stress test in 2019 showed stress EF of 16%. TTE the following day showed LVEF of 30-35% with grade I diastolic dysfunction and patient was started on GDMT. Underwent AAA repair in 2019. TTE in 10/2020 showed LVEF of 30-35% with grade I diastolic dysfunction and RV systolic function low normal. TTE in 02/2021 showed improved LVEF to 40-45% with normal RV function. TTE in 04/2021 showed LVEF reduced again to 30-35%. Most recent TTE this admission showed LVEF 40-45% with grade I diastolic dysfunction with mild mitral regurgitation.  Pertinent Lab Values: Creat  Date Value Ref Range Status  10/03/2011 1.12 0.50 - 1.35 mg/dL Final   Creatinine, Ser  Date Value Ref Range Status  12/14/2022 1.10 0.61 - 1.24 mg/dL Final   BUN  Date Value Ref Range Status  12/14/2022 30 (H) 8 - 23 mg/dL Final  62/13/0865 10 4 - 21 Final  04/23/2011 16 7 - 18 mg/dL Final   Potassium  Date Value Ref Range Status  12/14/2022 3.5 3.5 - 5.1 mmol/L Final  04/23/2011 3.9 3.5 - 5.1 mmol/L Final   Sodium  Date Value Ref Range Status  12/14/2022 143 135 - 145 mmol/L Final  04/04/2021 148 (A) 137 - 147 Final  04/23/2011 145 136 - 145 mmol/L Final   B Natriuretic Peptide  Date Value Ref Range Status  12/12/2022 926.2 (H) 0.0 - 100.0 pg/mL Final    Comment:    Performed at [facility], [address] [place], [place] [address]   Magnesium  Date Value Ref Range Status  04/25/2021 1.9 1.7 - 2.4 mg/dL Final    Comment:    Performed at [facility], [address] [place], [place] [address]   Hgb A1c MFr Bld  Date Value Ref Range Status  02/21/2021 5.5 4.8 - 5.6 % Final    Comment:    (NOTE) Pre diabetes:          5.7%-6.4%  Diabetes:              >6.4%  Glycemic control for   <7.0% adults with diabetes    TSH  Date Value Ref Range Status  06/08/2015 2.21 0.35 - 4.50 uIU/mL Final    Vital Signs: Temp:  [97.5 F (36.4 C)-98.9 F (37.2 C)] 98.1 F (36.7 C) (08/29 0730) Pulse Rate:  [51-61] 53 (08/29 0730) Resp:  [15-22] 15 (08/29 0730) BP: (99-123)/(55-69) 122/69 (08/29 0730) SpO2:  [91 %-92 %] 92 % (08/29 0730) FiO2 (%):  [28 %] 28 % (08/28 2151)   Intake/Output Summary (Last 24 hours) at 12/14/2022 0746 Last data filed at 12/13/2022 2351 Gross per 24 hour  Intake 240 ml  Output 1 ml  Net 239 ml    Current Inpatient Medications:  Loop Diuretic: Furosemide 40 mg IV BID Beta Blocker: Carvedilol 3.125 mg BID   Angiotensin Receptor/Neprilysin Inhibitor (ARNI): Entresto 97-103 mg BID Mineralocorticoid Receptor Antagonist (  MRA): Spironolactone 12.5 mg daily     Prior to admission HF Medications:  Loop Diuretic: Furosemide 40 mg daily Beta Blocker: Carvedilol 3.125 mg BID Angiotensin Receptor/Neprilysin Inhibitor (ARNI): Entresto 97-103 mg BID Mineralocorticoid Receptor Antagonist (MRA): Spironolactone 12.5 mg daily  Assessment: 1. Combined systolic and diastolic chronic heart failure (LVEF 40-45%), due to ICM. NYHA class II symptoms.  -SBP 100-120s today with HR 50s. BB dose limited by HR.  -Creatinine WNL, however BUN with sharp increase. Suspect nearing euvolemia. Hypokalemic. I/Os not documented accurately. No weight today. Can consider adding SGLT2i given minimal effect on BP. -BNP elevated on admission, however this can be falsely elevated by  Entresto. No LEE. Respiratory symptoms improved with diuresis.   Plan: 1) Medication changes recommended at this time: -Consider adding Farxiga 10 mg daily  2) Patient assistance: Copay checks for: [name], Jardiance, each $47  3) Education: - Patient has been educated on current HF medications and potential additions to HF medication regimen - Patient verbalizes understanding that over the next few months, these medication doses may change and more medications may be added to optimize HF regimen - Patient has been educated on basic disease state pathophysiology and goals of therapy  Medication Assistance / Insurance Benefits Check:  Does the patient have prescription insurance? Prescription Insurance: Medicare  Type of insurance plan:   Does the patient qualify for medication assistance through manufacturers or grants? No   Outpatient Pharmacy:  Prior to admission outpatient pharmacy: [facility]     Thank you for involving pharmacy in this patient's care.  Enos Fling, PharmD, BCPS Phone - [phone] Clinical Pharmacist 12/14/2022 7:46 AM

## 2022-12-14 NOTE — TOC Transition Note (Signed)
Transition of Care [facility]) - CM/SW Discharge Note   Patient Details  Name: [name] MRN: [id number] Date of Birth: 1945-09-11  Transition of Care [facility]) CM/SW Contact:  Garret Reddish, RN Phone Number: 12/14/2022, 2:57 PM   Clinical Narrative:   Chart reviewed.  Noted that patient has orders for discharge today.  I have spoken with patient.  Mr. Serafino informs me that prior to admission he lives at home by himself.  He reports that he has 2 step sons that check on him and assist him with meals and appointments if needed.    Patient reports that he has 2 wheeled rolling walker, 4 wheeled RW, and a BSC at home.  Mr. Causby reported that prior to admission he was able still able to drive.    I have spoken to Mr. Brumley in regards to need for home 02 on discharge.  I have also spoken to Mr. Eckels about PT recommendation about Home Health PT/OT on discharge.  Mr. Bloom is agreeable. Mr. Brucato had no home health preference.  I have asked Barbara Cower with [facility] to accept home health referral.  Home Health services will include Home Health PT, OT, and RN.  I have asked Mitch with [facility] to provide patient with home 02.    I have informed staff nurse of the above information.      Final next level of care: Home w Home Health Services Barriers to Discharge: No Barriers Identified   Patient Goals and CMS Choice CMS Medicare.gov Compare Post Acute Care list provided to:: Patient Choice offered to / list presented to : Patient  Discharge Placement                      Patient and family notified of of transfer: 12/14/22  Discharge Plan and Services Additional resources added to the After Visit Summary for                  DME Arranged: Oxygen DME Agency: [facility] Date DME Agency Contacted: 12/14/22 Time DME Agency Contacted: 1445 Representative spoke with at DME Agency: Mitch HH Arranged: RN, PT, OT [facility] Agency: [facility] Date HH Agency  Contacted: 12/13/22 Time HH Agency Contacted: 1100 Representative spoke with at [facility] Agency: Barbara Cower  Social Determinants of Health (SDOH) Interventions SDOH Screenings   Food Insecurity: No Food Insecurity (12/12/2022)  Housing: Low Risk  (12/12/2022)  Transportation Needs: No Transportation Needs (12/12/2022)  Utilities: Not At Risk (12/12/2022)  Depression (PHQ2-9): Low Risk  (09/03/2018)  Financial Resource Strain: Low Risk  (06/21/2022)   Received from [facility]  Stress: No Stress Concern Present (08/21/2018)  Tobacco Use: High Risk (12/12/2022)     Readmission Risk Interventions    04/28/2021    9:32 AM  Readmission Risk Prevention Plan  Transportation Screening Complete  PCP or Specialist Appt within 3-5 Days Complete  HRI or Home Care Consult Complete  Social Work Consult for Recovery Care Planning/Counseling Complete  Palliative Care Screening Not Applicable  Medication Review Ocean[employer]) Complete

## 2022-12-14 NOTE — Plan of Care (Signed)
  Problem: Nutrition: Goal: Adequate nutrition will be maintained Outcome: Progressing   Problem: Pain Managment: Goal: General experience of comfort will improve Outcome: Progressing   Problem: Skin Integrity: Goal: Risk for impaired skin integrity will decrease Outcome: Progressing   

## 2022-12-14 NOTE — Discharge Summary (Signed)
Physician Discharge Summary  MAYUR PAOLO IHK:[id number] DOB: 27-Nov-1945 DOA: 12/12/2022  PCP: Danella Penton, MD  Admit date: 12/12/2022 Discharge date: 12/14/2022  Admitted From: Home  Discharge disposition: home with Home PT  Recommendations for Outpatient Follow-Up:   Follow up with your primary care provider in one week.  Check CBC, BMP, magnesium in the next visit Patient was advised Lasix twice a day for next 3 days followed by once daily. Continue oxygen until seen by your primary care provider   Discharge Diagnosis:   Principal Problem:   Acute hypoxic respiratory failure (HCC) Active Problems:   Acute exacerbation of chronic obstructive pulmonary disease (COPD) (HCC)   Acute on chronic combined systolic and diastolic CHF (congestive heart failure) (HCC)   COPD (chronic obstructive pulmonary disease) (HCC)   Acute hypoxemic respiratory failure (HCC)   Discharge Condition: Improved.  Diet recommendation: Low sodium, heart healthy.    Wound care: None.  Code status: Full.   History of Present Illness:   Patient is a 77 years old male with past medical history of COPD Gold stage II, chronic heart failure with reduced ejection fraction with elevation fraction of 30 to 35%, anxiety and depression, CAD status post stent, GERD presented hospital with new onset of productive cough, shortness of breath and wheezing for 3 days prior to presentation.  In the ED patient was hypoxic with pulse ox of 80% on room air.  Chest x-ray showed pulmonary vascular congestion.  Patient was given IV Decadron nebulizers diuretic and was admitted hospital for further evaluation and treatment.[name]   Hospital Course:   Following conditions were addressed during hospitalization as listed below,  Acute hypoxic respiratory failure likely secondary to COPD /CHF exacerbation. Send is on Lasix daily at home.  Received IV 40mg  twice daily during hospitalization with improvement in his symptoms.   Will continue oral Lasix twice daily for next 3 days on discharge and changed to daily after that.  2D echocardiogram this admission shows LV ejection fraction of 40 to 45% with global hypokinesis.  Improvement in ejection fraction compared to echo from 04/2021.  Will continue Entresto spironolactone Coreg from home on discharge.  At this time patient has qualified for home oxygen on discharge.   Acute on chronic HFrEF decompensation Due to IV Lasix twice daily during hospitalization.  Improved at this time.  Will continue Entresto, spironolactone, Coreg from home.  Acute COPD exacerbation Acute hypoxia Chronic compensated hypercapnia  -Patient allergic to prednisone, but tolerated IV Decadron in the ED.  Plan to continue p.o. Decadron for the next few days on discharge.  Continue inhalers from home.  Patient was encouraged to consider spirometry and flutter valve. Complete empiric course of doxycycline on discharge. Patient has qualified for home oxygen on discharge.  Essential hypertension. Continue Coreg.    Cigarette smoking Nicotine patch has been prescribed on discharge.  Disposition.  At this time, patient is stable for disposition home with outpatient PCP follow-up.  Medical Consultants:   None  Procedures:    None Subjective:   Today, patient was seen and examined at bedside.  Had some nausea mild shortness of breath no chest pain.  Patient stated that he walked down the hallway.  Lives with his stepsons.  Has qualified for home oxygen.  Discharge Exam:   Vitals:   12/14/22 0730 12/14/22 0753  BP: 122/69   Pulse: (!) 53   Resp: 15   Temp: 98.1 F (36.7 C)   SpO2: 92% 92%  Vitals:   12/13/22 2351 12/14/22 0047 12/14/22 0730 12/14/22 0753  BP: 99/67 122/64 122/69   Pulse: 61 (!) 56 (!) 53   Resp: (!) 22 15 15    Temp: (!) 97.5 F (36.4 C) 98.3 F (36.8 C) 98.1 F (36.7 C)   TempSrc:  Oral    SpO2: 91% 91% 92% 92%  Weight:      Height:       Body mass  index is 24.47 kg/m.   General: Alert awake, not in obvious distress, on nasal cannula oxygen HENT: pupils equally reacting to light,  No scleral pallor or icterus noted. Oral mucosa is moist.  Chest:  Diminished breath sounds bilaterally.  Coarse breath sounds noted. CVS: S1 &S2 heard. No murmur.  Regular rate and rhythm. Abdomen: Soft, nontender, nondistended.  Bowel sounds are heard.   Extremities: No cyanosis, clubbing with trace edema.  Peripheral pulses are palpable. Psych: Alert, awake and oriented, normal mood CNS:  No cranial nerve deficits.  Power equal in all extremities.   Skin: Warm and dry.  No rashes noted.  The results of significant diagnostics from this hospitalization (including imaging, microbiology, ancillary and laboratory) are listed below for reference.     Diagnostic Studies:   DG Chest 1 View  Result Date: 12/13/2022 CLINICAL DATA:  CHF. Cough and shortness of breath. History of COPD. Smoker. EXAM: CHEST  1 VIEW COMPARISON:  12/12/2022 FINDINGS: Stable mild cardiac enlargement. Aortic tortuosity and atherosclerotic calcifications. New blunting of the left costophrenic angle concerning for pleural effusion. Mild diffuse increase interstitial markings noted bilaterally, similar. No superimposed airspace consolidation. IMPRESSION: 1. New left pleural effusion with similar mild diffuse increase interstitial markings compatible with pulmonary vascular congestion/mild edema. 2. Stable mild cardiac enlargement. Electronically Signed   By: Signa Kell M.D.   On: 12/13/2022 07:11   ECHOCARDIOGRAM COMPLETE  Result Date: 12/12/2022    ECHOCARDIOGRAM REPORT   Patient Name:   [name] Date of Exam: 12/12/2022 Medical Rec #:  [id number]      Height:       72.0 in Accession #:    [id number]     Weight:       190.0 lb Date of Birth:  1945/06/03      BSA:          2.085 m Patient Age:    77 years       BP:           128/68 mmHg Patient Gender: M              HR:           70  bpm. Exam Location:  [facility] Procedure: 2D Echo, Cardiac Doppler and Color Doppler Indications:     CHF  History:         Patient has prior history of Echocardiogram examinations, most                  recent 04/25/2021. CHF and Cardiomyopathy, CAD and Previous                  Myocardial Infarction, Stroke and COPD; Risk                  Factors:Hypertension, Dyslipidemia and Current Smoker. AAA.  Sonographer:     Mikki Harbor Referring Phys:  [id number] Emeline General Diagnosing Phys: Yvonne Kendall MD  Sonographer Comments: Technically difficult study due to poor echo windows and suboptimal parasternal window. Image acquisition  challenging due to respiratory motion and Image acquisition challenging due to COPD. IMPRESSIONS  1. Left ventricular ejection fraction, by estimation, is 40 to 45%. The left ventricle has mildly decreased function. The left ventricle demonstrates global hypokinesis. There is moderate left ventricular hypertrophy. Left ventricular diastolic parameters are consistent with Grade I diastolic dysfunction (impaired relaxation).  2. Right ventricular systolic function is normal. The right ventricular size is mildly enlarged. There is normal pulmonary artery systolic pressure.  3. Left atrial size was mildly dilated.  4. Right atrial size was mildly dilated.  5. The mitral valve is grossly normal. Mild mitral valve regurgitation. No evidence of mitral stenosis.  6. The aortic valve was not well visualized. Aortic valve regurgitation is not visualized. No aortic stenosis is present.  7. Pulmonic valve regurgitation not well-assessed.  8. The inferior vena cava is normal in size with greater than 50% respiratory variability, suggesting right atrial pressure of 3 mmHg. FINDINGS  Left Ventricle: Left ventricular ejection fraction, by estimation, is 40 to 45%. The left ventricle has mildly decreased function. The left ventricle demonstrates global hypokinesis. The left ventricular internal cavity size was  normal in size. There is  moderate left ventricular hypertrophy. Left ventricular diastolic parameters are consistent with Grade I diastolic dysfunction (impaired relaxation). Right Ventricle: The right ventricular size is mildly enlarged. No increase in right ventricular wall thickness. Right ventricular systolic function is normal. There is normal pulmonary artery systolic pressure. The tricuspid regurgitant velocity is 2.62  m/s, and with an assumed right atrial pressure of 3 mmHg, the estimated right ventricular systolic pressure is 30.5 mmHg. Left Atrium: Left atrial size was mildly dilated. Right Atrium: Right atrial size was mildly dilated. Pericardium: There is no evidence of pericardial effusion. Mitral Valve: The mitral valve is grossly normal. Mild mitral valve regurgitation. No evidence of mitral valve stenosis. MV peak gradient, 6.8 mmHg. The mean mitral valve gradient is 2.0 mmHg. Tricuspid Valve: The tricuspid valve is normal in structure. Tricuspid valve regurgitation is mild. Aortic Valve: The aortic valve was not well visualized. Aortic valve regurgitation is not visualized. No aortic stenosis is present. Aortic valve mean gradient measures 4.0 mmHg. Aortic valve peak gradient measures 9.9 mmHg. Aortic valve area, by VTI measures 2.93 cm. Pulmonic Valve: The pulmonic valve was not well visualized. Pulmonic valve regurgitation not well-assessed. Aorta: The aortic root is normal in size and structure. Venous: The inferior vena cava is normal in size with greater than 50% respiratory variability, suggesting right atrial pressure of 3 mmHg. IAS/Shunts: No atrial level shunt detected by color flow Doppler.  LEFT VENTRICLE PLAX 2D LVIDd:         4.30 cm      Diastology LVIDs:         3.00 cm      LV e' medial:    5.44 cm/s LV PW:         1.40 cm      LV E/e' medial:  10.1 LV IVS:        1.30 cm      LV e' lateral:   8.27 cm/s LVOT diam:     2.00 cm      LV E/e' lateral: 6.7 LV SV:         99 LV SV  Index:   48 LVOT Area:     3.14 cm  LV Volumes (MOD) LV vol d, MOD A2C: 161.0 ml LV vol d, MOD A4C: 170.0 ml LV vol s, MOD A2C:  87.6 ml LV vol s, MOD A4C: 99.6 ml LV SV MOD A2C:     73.4 ml LV SV MOD A4C:     170.0 ml LV SV MOD BP:      67.4 ml RIGHT VENTRICLE RV Basal diam:  3.75 cm RV Mid diam:    3.20 cm RV S prime:     12.20 cm/s TAPSE (M-mode): 3.1 cm LEFT ATRIUM             Index        RIGHT ATRIUM           Index LA diam:        3.90 cm 1.87 cm/m   RA Area:     21.00 cm LA Vol (A2C):   73.5 ml 35.26 ml/m  RA Volume:   60.10 ml  28.83 ml/m LA Vol (A4C):   83.2 ml 39.91 ml/m LA Biplane Vol: 82.7 ml 39.67 ml/m  AORTIC VALVE AV Area (Vmax):    2.72 cm AV Area (Vmean):   2.77 cm AV Area (VTI):     2.93 cm AV Vmax:           157.00 cm/s AV Vmean:          95.400 cm/s AV VTI:            0.339 m AV Peak Grad:      9.9 mmHg AV Mean Grad:      4.0 mmHg LVOT Vmax:         136.00 cm/s LVOT Vmean:        84.100 cm/s LVOT VTI:          0.316 m LVOT/AV VTI ratio: 0.93  AORTA Ao Root diam: 3.70 cm MITRAL VALVE                TRICUSPID VALVE MV Area (PHT): 1.81 cm     TR Peak grad:   27.5 mmHg MV Area VTI:   2.09 cm     TR Vmax:        262.00 cm/s MV Peak grad:  6.8 mmHg MV Mean grad:  2.0 mmHg     SHUNTS MV Vmax:       1.30 m/s     Systemic VTI:  0.32 m MV Vmean:      61.4 cm/s    Systemic Diam: 2.00 cm MV Decel Time: 418 msec MV E velocity: 55.00 cm/s MV A velocity: 102.00 cm/s MV E/A ratio:  0.54 Cristal Deer End MD Electronically signed by Yvonne Kendall MD Signature Date/Time: 12/12/2022/2:53:37 PM    Final    DG Chest Portable 1 View  Result Date: 12/12/2022 CLINICAL DATA:  Congestion, cough and SOB. History of COPD. Smoker. EXAM: PORTABLE CHEST 1 VIEW COMPARISON:  04/24/2021 FINDINGS: Stable cardiac enlargement. Aortic calcifications and tortuosity. No pleural fluid, interstitial edema, or airspace consolidation. Pulmonary vascular congestion without overt edema. IMPRESSION: Cardiomegaly and pulmonary  vascular congestion. Aortic Atherosclerosis (ICD10-I70.0). Electronically Signed   By: Signa Kell M.D.   On: 12/12/2022 06:41     Labs:   Basic Metabolic Panel: Recent Labs  Lab 12/12/22 0616 12/13/22 0303 12/14/22 0601  NA 145 144 143  K 3.4* 3.3* 3.5  CL 107 104 98  CO2 33* 33* 33*  GLUCOSE 116* 131* 85  BUN 19 21 30*  CREATININE 1.09 1.18 1.10  CALCIUM 8.3* 8.5* 8.6*   GFR Estimated Creatinine Clearance: 59.8 mL/min (by C-G formula based on SCr of 1.1 mg/dL). Liver Function  Tests: No results for input(s): "AST", "ALT", "ALKPHOS", "BILITOT", "PROT", "ALBUMIN" in the last 168 hours. No results for input(s): "LIPASE", "AMYLASE" in the last 168 hours. No results for input(s): "AMMONIA" in the last 168 hours. Coagulation profile No results for input(s): "INR", "PROTIME" in the last 168 hours.  CBC: Recent Labs  Lab 12/12/22 0616 12/14/22 0601  WBC 10.5 8.6  HGB 14.3 14.4  HCT 44.4 43.1  MCV 98.9 95.6  PLT 172 160   Cardiac Enzymes: No results for input(s): "CKTOTAL", "CKMB", "CKMBINDEX", "TROPONINI" in the last 168 hours. BNP: Invalid input(s): "POCBNP" CBG: No results for input(s): "GLUCAP" in the last 168 hours. D-Dimer No results for input(s): "DDIMER" in the last 72 hours. Hgb A1c No results for input(s): "HGBA1C" in the last 72 hours. Lipid Profile No results for input(s): "CHOL", "HDL", "LDLCALC", "TRIG", "CHOLHDL", "LDLDIRECT" in the last 72 hours. Thyroid function studies No results for input(s): "TSH", "T4TOTAL", "T3FREE", "THYROIDAB" in the last 72 hours.  Invalid input(s): "FREET3" Anemia work up No results for input(s): "VITAMINB12", "FOLATE", "FERRITIN", "TIBC", "IRON", "RETICCTPCT" in the last 72 hours. Microbiology Recent Results (from the past 240 hour(s))  Resp panel by RT-PCR (RSV, Flu A&B, Covid) Anterior Nasal Swab     Status: None   Collection Time: 12/12/22  6:16 AM   Specimen: Anterior Nasal Swab  Result Value Ref Range Status    SARS Coronavirus 2 by RT PCR NEGATIVE NEGATIVE Final    Comment: (NOTE) SARS-CoV-2 target nucleic acids are NOT DETECTED.  The SARS-CoV-2 RNA is generally detectable in upper respiratory specimens during the acute phase of infection. The lowest concentration of SARS-CoV-2 viral copies this assay can detect is 138 copies/mL. A negative result does not preclude SARS-Cov-2 infection and should not be used as the sole basis for treatment or other patient management decisions. A negative result may occur with  improper specimen collection/handling, submission of specimen other than nasopharyngeal swab, presence of viral mutation(s) within the areas targeted by this assay, and inadequate number of viral copies(<138 copies/mL). A negative result must be combined with clinical observations, patient history, and epidemiological information. The expected result is Negative.  Fact Sheet for Patients:  BloggerCourse.com  Fact Sheet for Healthcare Providers:  SeriousBroker.it  This test is no t yet approved or cleared by the [place] FDA and  has been authorized for detection and/or diagnosis of SARS-CoV-2 by FDA under an Emergency Use Authorization (EUA). This EUA will remain  in effect (meaning this test can be used) for the duration of the COVID-19 declaration under Section 564(b)(1) of the Act, 21 U.S.C.section 360bbb-3(b)(1), unless the authorization is terminated  or revoked sooner.       Influenza A by PCR NEGATIVE NEGATIVE Final   Influenza B by PCR NEGATIVE NEGATIVE Final    Comment: (NOTE) The Xpert Xpress SARS-CoV-2/FLU/RSV plus assay is intended as an aid in the diagnosis of influenza from Nasopharyngeal swab specimens and should not be used as a sole basis for treatment. Nasal washings and aspirates are unacceptable for Xpert Xpress SARS-CoV-2/FLU/RSV testing.  Fact Sheet for  Patients: BloggerCourse.com  Fact Sheet for Healthcare Providers: SeriousBroker.it  This test is not yet approved or cleared by the [place] FDA and has been authorized for detection and/or diagnosis of SARS-CoV-2 by FDA under an Emergency Use Authorization (EUA). This EUA will remain in effect (meaning this test can be used) for the duration of the COVID-19 declaration under Section 564(b)(1) of the Act, 21 U.S.C. section 360bbb-3(b)(1),  unless the authorization is terminated or revoked.     Resp Syncytial Virus by PCR NEGATIVE NEGATIVE Final    Comment: (NOTE) Fact Sheet for Patients: BloggerCourse.com  Fact Sheet for Healthcare Providers: SeriousBroker.it  This test is not yet approved or cleared by the [place] FDA and has been authorized for detection and/or diagnosis of SARS-CoV-2 by FDA under an Emergency Use Authorization (EUA). This EUA will remain in effect (meaning this test can be used) for the duration of the COVID-19 declaration under Section 564(b)(1) of the Act, 21 U.S.C. section 360bbb-3(b)(1), unless the authorization is terminated or revoked.  Performed at [facility], [address] [place], [place] [address]   Expectorated Sputum Assessment w Gram Stain, Rflx to Resp Cult     Status: None   Collection Time: 12/12/22  8:28 PM   Specimen: Sputum  Result Value Ref Range Status   Specimen Description SPUTUM  Final   Special Requests NONE  Final   Sputum evaluation   Final    THIS SPECIMEN IS ACCEPTABLE FOR SPUTUM CULTURE Performed at [facility], [address] [place], [place] [address]    Report Status 12/12/2022 FINAL  Final  Culture, Respiratory w Gram Stain     Status: None (Preliminary result)   Collection Time: 12/12/22  8:28 PM   Specimen: SPU  Result Value Ref Range Status   Specimen Description   Final     SPUTUM Performed at [facility], [address] [place], [place] [address]    Special Requests   Final    NONE Reflexed from [id number] Performed at [facility], [address] [place], [place] [address]    Gram Stain   Final    RARE WBC PRESENT, PREDOMINANTLY PMN FEW GRAM POSITIVE COCCI FEW GRAM NEGATIVE RODS    Culture   Final    ABUNDANT STREPTOCOCCUS PNEUMONIAE CULTURE REINCUBATED FOR BETTER GROWTH Performed at [facility], [address], [place], [place] [address]    Report Status PENDING  Incomplete     Discharge Instructions:   Discharge Instructions     (HEART FAILURE PATIENTS) Call MD:  Anytime you have any of the following symptoms: 1) 3 pound weight gain in 24 hours or 5 pounds in 1 week 2) shortness of breath, with or without a dry hacking cough 3) swelling in the hands, feet or stomach 4) if you have to sleep on extra pillows at night in order to breathe.   Complete by: As directed    Diet - low sodium heart healthy   Complete by: As directed    Discharge instructions   Complete by: As directed    Follow-up with your primary care provider in 1 week.  Check blood work at that time.  Continue inhalers at home.  Continue course of steroid and antibiotic.  Seek medical attention for worsening symptoms.  Take your water pill (lasix) twice a day for next 3 days then once a day.   Increase activity slowly   Complete by: As directed       Allergies as of 12/14/2022       Reactions   Lyrica [pregabalin] Swelling      Prednisone Other (See Comments)   Feels like having a heart attack. Has had the injection form before and does well. Oral Steroids Feels like having a heart attack Feels like having a heart attack. Has had the injection form before and does well. Feels like having a  heart attack. Has had the injection form before and does well. Feels like having a heart attack. Has had the injection form before and does well. Oral Steroids         Medication List     TAKE these medications    acetaminophen 325 MG tablet Commonly known as: TYLENOL Take 2 tablets (650 mg total) by mouth every 6 (six) hours as needed for mild pain (or Fever >/= 101). Notes to patient: Not given in hospital   albuterol 108 (90 Base) MCG/ACT inhaler Commonly known as: VENTOLIN HFA Inhale 2 puffs into the lungs every 6 (six) hours as needed for wheezing. Notes to patient: Not given in hospital   aspirin EC 81 MG tablet Take 81 mg by mouth daily.   atorvastatin 80 MG tablet Commonly known as: LIPITOR Take 1 tablet (80 mg total) by mouth daily.   budesonide-formoterol 160-4.5 MCG/ACT inhaler Commonly known as: Symbicort Inhale 2 puffs into the lungs 2 (two) times daily. Notes to patient: Not given in hospital   CALCIUM 500 PO Take 500 mg by mouth daily. Notes to patient: Not given in hospital   carvedilol 3.125 MG tablet Commonly known as: COREG Take 1 tablet (3.125 mg total) by mouth 2 (two) times daily.   dexamethasone 0.5 MG tablet Commonly known as: DECADRON Take 1 tablet (0.5 mg total) by mouth daily. Start taking on: December 15, 2022   doxycycline 100 MG tablet Commonly known as: VIBRA-TABS Take 1 tablet (100 mg total) by mouth 2 (two) times daily for 3 days.   Entresto 97-103 MG Generic drug: sacubitril-valsartan Take 1 tablet by mouth 2 (two) times daily.   fluticasone furoate-vilanterol 200-25 MCG/ACT Aepb Commonly known as: BREO ELLIPTA Inhale 1 puff into the lungs daily.   furosemide 40 MG tablet Commonly known as: LASIX Take 1 tablet twice a day for 3 days then continue daily. What changed:  how much to take how to take this when to take this additional instructions   mirtazapine 7.5 MG tablet Commonly known as: REMERON Take 7.5 mg by mouth at bedtime.   multivitamin with minerals tablet Take 1 tablet by mouth daily.   nicotine 14 mg/24hr patch Commonly known as: NICODERM CQ - dosed in mg/24  hours Place 1 patch (14 mg total) onto the skin daily. Start taking on: December 15, 2022   omeprazole 40 MG capsule Commonly known as: PRILOSEC TAKE 1 CAPSULE BY MOUTH DAILY USUALLY BEFORE BREAKFAST What changed:  how much to take how to take this when to take this additional instructions Notes to patient: Not given in hospital   rOPINIRole 0.5 MG tablet Commonly known as: REQUIP Take 0.5 mg by mouth 3 (three) times daily.   spironolactone 25 MG tablet Commonly known as: ALDACTONE Take 0.5 tablets (12.5 mg total) by mouth daily. FOR CHRONIC COMBINED CHF   traMADol 50 MG tablet Commonly known as: ULTRAM Take 50 mg by mouth every 6 (six) hours as needed.   valproic acid 250 MG capsule Commonly known as: DEPAKENE Take 1 capsule (250 mg total) by mouth 2 (two) times daily.               Durable Medical Equipment  (From admission, onward)           Start     Ordered   12/14/22 1450  For home use only DME oxygen  Once       Question Answer Comment  Length of Need 12 Months  Mode or (Route) Nasal cannula   Liters per Minute 2   Frequency Continuous (stationary and portable oxygen unit needed)   Oxygen conserving device Yes   Oxygen delivery system Gas      12/14/22 1449            Follow-up Information     Ignacia Bayley, PA-C .   Specialty: Physician Assistant Contact information: [address] [facility] [place] [address] [phone]                  Time coordinating discharge: 39 minutes  Signed:  Aysen Shieh  [facility] 12/14/2022, 2:51 PM

## 2022-12-14 NOTE — Progress Notes (Signed)
Physical Therapy Treatment Patient Details Name: [name] MRN: [id number] DOB: Oct 04, 1945 Today's Date: 12/14/2022   History of Present Illness Patient is a 77 year old male with medical history significant of COPD Gold stage II, chronic HFrEF with LVEF 30-35%, anxiety/depression, CAD status post stenting, GERD, presented with new onset of productive cough shortness of breath and wheezing.    PT Comments  Pt was sitting EOB upon arrival. He had recently removed O2 nasal cannula. Sao2 86%. Reapplied 2 L o2 throughout remainder of session with sao2 staying 88% or greater. He requested to use BR for BM prior to ambulation into hallway > 200 ft. No LOB with BUE support. DC recs remain appropriate. Will continue to follow per current POC.    If plan is discharge home, recommend the following: Assistance with cooking/housework;Help with stairs or ramp for entrance;Assist for transportation     Equipment Recommendations  None recommended by PT       Precautions / Restrictions Precautions Precautions: Fall Restrictions Weight Bearing Restrictions: No     Mobility  Bed Mobility  General bed mobility comments: pt was seated EOB upon arrival    Transfers Overall transfer level: Needs assistance Equipment used: Rolling walker (2 wheels) Transfers: Sit to/from Stand Sit to Stand: Supervision   Ambulation/Gait Ambulation/Gait assistance: Supervision Gait Distance (Feet): 200 Feet Assistive device: Rolling walker (2 wheels) Gait Pattern/deviations: Narrow base of support, Trunk flexed, Step-through pattern, Decreased stride length, Scissoring Gait velocity: decreased  General Gait Details: no LOB or intervention during ambulation with RW > 200 ft    Balance Overall balance assessment: Needs assistance Sitting-balance support: Feet supported Sitting balance-Leahy Scale: Good     Standing balance support: Bilateral upper extremity supported, During functional activity, Reliant on  assistive device for balance Standing balance-Leahy Scale: Good Standing balance comment: reliant on RW. Vcs for increased BOS during gait      Cognition Arousal: Alert Behavior During Therapy: WFL for tasks assessed/performed Overall Cognitive Status: Within Functional Limits for tasks assessed             Pertinent Vitals/Pain Pain Assessment Pain Assessment: No/denies pain     PT Goals (current goals can now be found in the care plan section) Acute Rehab PT Goals Patient Stated Goal: to go home Progress towards PT goals: Progressing toward goals    Frequency    Min 1X/week       AM-PAC PT "6 Clicks" Mobility   Outcome Measure  Help needed turning from your back to your side while in a flat bed without using bedrails?: None Help needed moving from lying on your back to sitting on the side of a flat bed without using bedrails?: A Little Help needed moving to and from a bed to a chair (including a wheelchair)?: A Little Help needed standing up from a chair using your arms (e.g., wheelchair or bedside chair)?: A Little Help needed to walk in hospital room?: A Little Help needed climbing 3-5 steps with a railing? : A Little 6 Click Score: 19    End of Session Equipment Utilized During Treatment: Oxygen Activity Tolerance: Patient tolerated treatment well;Patient limited by fatigue Patient left: in chair;with call bell/phone within reach;with nursing/sitter in room Nurse Communication: Mobility status PT Visit Diagnosis: Unsteadiness on feet (R26.81);Muscle weakness (generalized) (M62.81)     Time: 5284-1324 PT Time Calculation (min) (ACUTE ONLY): 26 min  Charges:    $Gait Training: 8-22 mins $Therapeutic Activity: 8-22 mins PT General Charges $$ ACUTE PT  VISIT: 1 Visit                    Jetta Lout PTA 12/14/22, 10:21 AM

## 2022-12-15 DIAGNOSIS — Z7952 Long term (current) use of systemic steroids: Secondary | ICD-10-CM | POA: Diagnosis not present

## 2022-12-15 DIAGNOSIS — I2510 Atherosclerotic heart disease of native coronary artery without angina pectoris: Secondary | ICD-10-CM | POA: Diagnosis not present

## 2022-12-15 DIAGNOSIS — I5040 Unspecified combined systolic (congestive) and diastolic (congestive) heart failure: Secondary | ICD-10-CM | POA: Diagnosis not present

## 2022-12-15 DIAGNOSIS — F17210 Nicotine dependence, cigarettes, uncomplicated: Secondary | ICD-10-CM | POA: Diagnosis not present

## 2022-12-15 DIAGNOSIS — Z556 Problems related to health literacy: Secondary | ICD-10-CM | POA: Diagnosis not present

## 2022-12-15 DIAGNOSIS — J441 Chronic obstructive pulmonary disease with (acute) exacerbation: Secondary | ICD-10-CM | POA: Diagnosis not present

## 2022-12-15 DIAGNOSIS — J449 Chronic obstructive pulmonary disease, unspecified: Secondary | ICD-10-CM | POA: Diagnosis not present

## 2022-12-15 DIAGNOSIS — I5043 Acute on chronic combined systolic (congestive) and diastolic (congestive) heart failure: Secondary | ICD-10-CM | POA: Diagnosis not present

## 2022-12-15 DIAGNOSIS — Z9981 Dependence on supplemental oxygen: Secondary | ICD-10-CM | POA: Diagnosis not present

## 2022-12-15 DIAGNOSIS — I739 Peripheral vascular disease, unspecified: Secondary | ICD-10-CM | POA: Diagnosis not present

## 2022-12-15 DIAGNOSIS — J9611 Chronic respiratory failure with hypoxia: Secondary | ICD-10-CM | POA: Diagnosis not present

## 2022-12-15 DIAGNOSIS — I071 Rheumatic tricuspid insufficiency: Secondary | ICD-10-CM | POA: Diagnosis not present

## 2022-12-15 DIAGNOSIS — F32A Depression, unspecified: Secondary | ICD-10-CM | POA: Diagnosis not present

## 2022-12-15 DIAGNOSIS — I714 Abdominal aortic aneurysm, without rupture, unspecified: Secondary | ICD-10-CM | POA: Diagnosis not present

## 2022-12-15 DIAGNOSIS — Z952 Presence of prosthetic heart valve: Secondary | ICD-10-CM | POA: Diagnosis not present

## 2022-12-15 DIAGNOSIS — J9601 Acute respiratory failure with hypoxia: Secondary | ICD-10-CM | POA: Diagnosis not present

## 2022-12-15 DIAGNOSIS — Z8546 Personal history of malignant neoplasm of prostate: Secondary | ICD-10-CM | POA: Diagnosis not present

## 2022-12-15 DIAGNOSIS — Z7982 Long term (current) use of aspirin: Secondary | ICD-10-CM | POA: Diagnosis not present

## 2022-12-15 DIAGNOSIS — J9612 Chronic respiratory failure with hypercapnia: Secondary | ICD-10-CM | POA: Diagnosis not present

## 2022-12-15 DIAGNOSIS — Z7951 Long term (current) use of inhaled steroids: Secondary | ICD-10-CM | POA: Diagnosis not present

## 2022-12-15 DIAGNOSIS — F419 Anxiety disorder, unspecified: Secondary | ICD-10-CM | POA: Diagnosis not present

## 2022-12-15 DIAGNOSIS — I110 Hypertensive heart disease with heart failure: Secondary | ICD-10-CM | POA: Diagnosis not present

## 2022-12-15 DIAGNOSIS — K219 Gastro-esophageal reflux disease without esophagitis: Secondary | ICD-10-CM | POA: Diagnosis not present

## 2022-12-15 DIAGNOSIS — I700 Atherosclerosis of aorta: Secondary | ICD-10-CM | POA: Diagnosis not present

## 2022-12-16 LAB — CULTURE, RESPIRATORY W GRAM STAIN

## 2022-12-18 NOTE — Progress Notes (Unsigned)
Advanced Heart Failure Clinic Note   Referring Physician: PCP: Danella Penton, MD (last seen 03/24) PCP-Cardiologist: Julien Nordmann, MD (last seen 09/23)  HPI:  [name] is a 77 y/o male with a history of  Admitted 12/12/22 due to new onset of productive cough, shortness of breath and wheezing for 3 days prior to presentation. In the ED patient was hypoxic with pulse ox of 80% on room air. Chest x-ray showed pulmonary vascular congestion. Given IV lasix. Echocardiogram this admission shows LV ejection fraction of 40 to 45% with global hypokinesis. Improvement in ejection fraction compared to echo from 04/2021. Qualified for home oxygen.    Echo 12/25/17: EF 30-35% grade 1 diastolic dysfunction, mildly dilated aortic root at 39 mm, mild mitral regurgitation, mildly dilated left atrium and normal pulmonary arterial pressure. Echo 10/25/18: EF 30-35% with mild LVH Echo 06/24/20: EF 30-35% with Grade 1 DD Echo 02/21/21: EF 40-45% with mild LVH, Grade I DD Echo 04/25/21: EF 30-35% with Grade I DD, mild LVH, moderately elevated PA pressure of 54.5 mmHg, mild Nathaniel Echo 12/12/22: EF 40-45% with moderate LVH, Grade I DD, normal PA Pressure with mild Nathaniel  LHC 01/05/11: 95% stenosis of the proximal LAD, 95% stenosis of the mid LAD, 30% in-stent restenosis of the mid left circumflex with a second lesion of diffuse 50% stenosis. Underwent successful PCI/BMS to the mid LAD with 0% residual stenosis .   He presents today for his initial HF visit with a chief complaint of   Review of Systems: [y] = yes, [ ]  = no   General: Weight gain [ ] ; Weight loss [ ] ; Anorexia [ ] ; Fatigue [ ] ; Fever [ ] ; Chills [ ] ; Weakness [ ]   Cardiac: Chest pain/pressure [ ] ; Resting SOB [ ] ; Exertional SOB [ ] ; Orthopnea [ ] ; Pedal Edema [ ] ; Palpitations [ ] ; Syncope [ ] ; Presyncope [ ] ; Paroxysmal nocturnal dyspnea[ ]   Pulmonary: Cough [ ] ; Wheezing[ ] ; Hemoptysis[ ] ; Sputum [ ] ; Snoring [ ]   GI: Vomiting[ ] ; Dysphagia[ ] ;  Melena[ ] ; Hematochezia [ ] ; Heartburn[ ] ; Abdominal pain [ ] ; Constipation [ ] ; Diarrhea [ ] ; BRBPR [ ]   GU: Hematuria[ ] ; Dysuria [ ] ; Nocturia[ ]   Vascular: Pain in legs with walking [ ] ; Pain in feet with lying flat [ ] ; Non-healing sores [ ] ; Stroke [ ] ; TIA [ ] ; Slurred speech [ ] ;  Neuro: Headaches[ ] ; Vertigo[ ] ; Seizures[ ] ; Paresthesias[ ] ;Blurred vision [ ] ; Diplopia [ ] ; Vision changes [ ]   Ortho/Skin: Arthritis [ ] ; Joint pain [ ] ; Muscle pain [ ] ; Joint swelling [ ] ; Back Pain [ ] ; Rash [ ]   Psych: Depression[ ] ; Anxiety[ ]   Heme: Bleeding problems [ ] ; Clotting disorders [ ] ; Anemia [ ]   Endocrine: Diabetes [ ] ; Thyroid dysfunction[ ]    Past Medical History:  Diagnosis Date   AAA (abdominal aortic aneurysm) (HCC)    a.  CTA 7/19: measured 4.5 x 5.3 cm and greatest transverse dimensions   Abnormal nuclear stress test    a.  Myoview 2012: anterior wall ischemia with an estimated EF of 26%.  This was a new wall motion abnormality as well as a newly reduced EF   Brain bleed (HCC)    COPD (chronic obstructive pulmonary disease) (HCC)    Coronary artery disease    a.  Posterior MI in 2002 status post BMS to the LCx; b. LHC 2012: 95% stenosis of the proximal LAD, 95% stenosis of the mid LAD,  30% in-stent restenosis of the mid left circumflex with a second lesion of diffuse 50% stenosis.  The patient underwent successful PCI/BMS to the mid LAD with 0% residual stenosis, LV gram not performed   GERD (gastroesophageal reflux disease)    History of kidney stones    History of prostate cancer    Hyperlipidemia    Hypertension    Myocardial infarction [facility])    Neuromuscular disorder (HCC)    Prostate cancer (HCC)    a.  Status post seeding   PVD (peripheral vascular disease) (HCC)    Stroke [facility])     Current Outpatient Medications  Medication Sig Dispense Refill   acetaminophen (TYLENOL) 325 MG tablet Take 2 tablets (650 mg total) by mouth every 6 (six) hours as needed for mild  pain (or Fever >/= 101).     albuterol (VENTOLIN HFA) 108 (90 Base) MCG/ACT inhaler Inhale 2 puffs into the lungs every 6 (six) hours as needed for wheezing. 1 each 1   aspirin EC 81 MG tablet Take 81 mg by mouth daily.     atorvastatin (LIPITOR) 80 MG tablet Take 1 tablet (80 mg total) by mouth daily. 30 tablet 0   budesonide-formoterol (SYMBICORT) 160-4.5 MCG/ACT inhaler Inhale 2 puffs into the lungs 2 (two) times daily. 6 g 0   Calcium Carbonate (CALCIUM 500 PO) Take 500 mg by mouth daily.     carvedilol (COREG) 3.125 MG tablet Take 1 tablet (3.125 mg total) by mouth 2 (two) times daily. 60 tablet 0   dexamethasone (DECADRON) 0.5 MG tablet Take 1 tablet (0.5 mg total) by mouth daily. 5 tablet 0   fluticasone furoate-vilanterol (BREO ELLIPTA) 200-25 MCG/ACT AEPB Inhale 1 puff into the lungs daily.     furosemide (LASIX) 40 MG tablet Take 1 tablet twice a day for 3 days then continue daily. 30 tablet 2   mirtazapine (REMERON) 7.5 MG tablet Take 7.5 mg by mouth at bedtime.     Multiple Vitamins-Minerals (MULTIVITAMIN WITH MINERALS) tablet Take 1 tablet by mouth daily.     nicotine (NICODERM CQ - DOSED IN MG/24 HOURS) 14 mg/24hr patch Place 1 patch (14 mg total) onto the skin daily. 28 patch 0   omeprazole (PRILOSEC) 40 MG capsule TAKE 1 CAPSULE BY MOUTH DAILY USUALLY BEFORE BREAKFAST (Patient taking differently: Take 40 mg by mouth daily. BEFORE BREAKFAST) 30 capsule 0   rOPINIRole (REQUIP) 0.5 MG tablet Take 0.5 mg by mouth 3 (three) times daily.     sacubitril-valsartan (ENTRESTO) 97-103 MG Take 1 tablet by mouth 2 (two) times daily. 180 tablet 3   spironolactone (ALDACTONE) 25 MG tablet Take 0.5 tablets (12.5 mg total) by mouth daily. FOR CHRONIC COMBINED CHF 30 tablet 0   traMADol (ULTRAM) 50 MG tablet Take 50 mg by mouth every 6 (six) hours as needed.     valproic acid (DEPAKENE) 250 MG capsule Take 1 capsule (250 mg total) by mouth 2 (two) times daily. 60 capsule 0   No  current facility-administered medications for this visit.    Allergies  Allergen Reactions   Lyrica [Pregabalin] Swelling           Social History   Socioeconomic History   Marital status: Married    Spouse name: Not on file   Number of children: Not on file   Years of education: Not on file   Highest education level: Not on file  Occupational History   Not on file  Tobacco Use  Smoking status: Every Day    Current packs/day: 0.25    Average packs/day: 0.3 packs/day for 56.0 years (14.0 ttl pk-yrs)    Types: Cigarettes   Smokeless tobacco: Never   Tobacco comments:    back to smoking X1 year  Vaping Use   Vaping status: Never Used  Substance and Sexual Activity   Alcohol use: No    Alcohol/week: 0.0 standard drinks of alcohol   Drug use: No   Sexual activity: Not on file  Other Topics Concern   Not on file  Social History Narrative   Not on file   Social Determinants of Health   Financial Resource Strain: Low Risk  (06/21/2022)   [facility], [facility]   Overall Financial Resource Strain (CARDIA)    Difficulty of Paying Living Expenses: Not hard at all  Food Insecurity: No Food Insecurity (12/12/2022)   Hunger Vital Sign    Worried About Running Out of Food in the Last Year: Never true    Ran Out of Food in the Last Year: Never true  Transportation Needs: No Transportation Needs (12/12/2022)   PRAPARE - Administrator, Civil Service (Medical): No    Lack of Transportation (Non-Medical): No  Physical Activity: Not on file  Stress: No Stress Concern Present (08/21/2018)   [employer] of Occupational Health - Occupational Stress Questionnaire    Feeling of Stress : Not at all  Social Connections: Not on file  Intimate Partner Violence: Not At Risk (12/12/2022)   Humiliation, Afraid, Rape, and Kick questionnaire    Fear of Current or Ex-Partner: No    Emotionally Abused: No    Physically Abused:  No    Sexually Abused: No      Family History  Problem Relation Age of Onset   Heart attack Mother    Diabetes Mother    Heart disease Father    Lung cancer Daughter     There were no vitals filed for this visit.   PHYSICAL EXAM: General:  Well appearing. No respiratory difficulty HEENT: normal Neck: supple. no JVD. Carotids 2+ bilat; no bruits. No lymphadenopathy or thyromegaly appreciated. Cor: PMI nondisplaced. Regular rate & rhythm. No rubs, gallops or murmurs. Lungs: clear Abdomen: soft, nontender, nondistended. No hepatosplenomegaly. No bruits or masses. Good bowel sounds. Extremities: no cyanosis, clubbing, rash, edema Neuro: alert & oriented x 3, cranial nerves grossly intact. moves all 4 extremities w/o difficulty. Affect pleasant.  ECG:   ASSESSMENT & PLAN:  1: Chronic heart failure with mildly reduced ejection fraction- - suspect - NYHA class - euvolemic - weighing daily Echo 12/25/17: EF 30-35% grade 1 diastolic dysfunction, mildly dilated aortic root at 39 mm, mild mitral regurgitation, mildly dilated left atrium and normal pulmonary arterial pressure. - Echo 10/25/18: EF 30-35% with mild LVH - Echo 06/24/20: EF 30-35% with Grade 1 DD - Echo 02/21/21: EF 40-45% with mild LVH, Grade I DD - Echo 04/25/21: EF 30-35% with Grade I DD, mild LVH, moderately elevated PA pressure of 54.5 mmHg, mild Nathaniel - Echo 12/12/22: EF 40-45% with moderate LVH, Grade I DD, normal PA Pressure with mild Nathaniel  - BNP 12/11/21 was 926.2  2: HTN- - BP - saw PCP Hyacinth Meeker) 03/24 - BMP 12/14/22 showed sodium 143, potassium 3.5, creatinine 1.1 and GFR >60  3: CAD- - saw cardiology ([facility]) 09/23 - LHC 01/05/11: 95% stenosis of the proximal LAD, 95% stenosis of the mid LAD, 30% in-stent restenosis  of the mid left circumflex with a second lesion of diffuse 50% stenosis. Underwent successful PCI/BMS to the mid LAD with 0% residual stenosis .   4: COPD-   Delma Freeze, FNP 12/18/22

## 2022-12-19 ENCOUNTER — Ambulatory Visit: Payer: PPO | Attending: Family | Admitting: Family

## 2022-12-19 ENCOUNTER — Encounter: Payer: Self-pay | Admitting: Family

## 2022-12-19 VITALS — BP 80/50 | HR 56 | Resp 16 | Wt 178.5 lb

## 2022-12-19 DIAGNOSIS — I110 Hypertensive heart disease with heart failure: Secondary | ICD-10-CM | POA: Insufficient documentation

## 2022-12-19 DIAGNOSIS — I255 Ischemic cardiomyopathy: Secondary | ICD-10-CM | POA: Insufficient documentation

## 2022-12-19 DIAGNOSIS — Z9981 Dependence on supplemental oxygen: Secondary | ICD-10-CM | POA: Diagnosis not present

## 2022-12-19 DIAGNOSIS — F419 Anxiety disorder, unspecified: Secondary | ICD-10-CM | POA: Insufficient documentation

## 2022-12-19 DIAGNOSIS — Z7982 Long term (current) use of aspirin: Secondary | ICD-10-CM | POA: Insufficient documentation

## 2022-12-19 DIAGNOSIS — I272 Pulmonary hypertension, unspecified: Secondary | ICD-10-CM | POA: Insufficient documentation

## 2022-12-19 DIAGNOSIS — K219 Gastro-esophageal reflux disease without esophagitis: Secondary | ICD-10-CM | POA: Diagnosis not present

## 2022-12-19 DIAGNOSIS — I252 Old myocardial infarction: Secondary | ICD-10-CM | POA: Diagnosis not present

## 2022-12-19 DIAGNOSIS — Z8673 Personal history of transient ischemic attack (TIA), and cerebral infarction without residual deficits: Secondary | ICD-10-CM | POA: Diagnosis not present

## 2022-12-19 DIAGNOSIS — J449 Chronic obstructive pulmonary disease, unspecified: Secondary | ICD-10-CM | POA: Insufficient documentation

## 2022-12-19 DIAGNOSIS — Z79899 Other long term (current) drug therapy: Secondary | ICD-10-CM | POA: Diagnosis not present

## 2022-12-19 DIAGNOSIS — E785 Hyperlipidemia, unspecified: Secondary | ICD-10-CM | POA: Insufficient documentation

## 2022-12-19 DIAGNOSIS — F32A Depression, unspecified: Secondary | ICD-10-CM | POA: Insufficient documentation

## 2022-12-19 DIAGNOSIS — I5042 Chronic combined systolic (congestive) and diastolic (congestive) heart failure: Secondary | ICD-10-CM | POA: Insufficient documentation

## 2022-12-19 DIAGNOSIS — I10 Essential (primary) hypertension: Secondary | ICD-10-CM | POA: Diagnosis not present

## 2022-12-19 DIAGNOSIS — F17210 Nicotine dependence, cigarettes, uncomplicated: Secondary | ICD-10-CM | POA: Diagnosis not present

## 2022-12-19 DIAGNOSIS — Z955 Presence of coronary angioplasty implant and graft: Secondary | ICD-10-CM | POA: Insufficient documentation

## 2022-12-19 DIAGNOSIS — I2510 Atherosclerotic heart disease of native coronary artery without angina pectoris: Secondary | ICD-10-CM | POA: Insufficient documentation

## 2022-12-19 MED ORDER — BISOPROLOL FUMARATE 5 MG PO TABS: 2.5000 mg | ORAL_TABLET | Freq: Every day | ORAL | 5 refills | Status: DC

## 2022-12-19 NOTE — Progress Notes (Signed)
Medication Samples have been provided to the patient.  Drug name: Sherryll Burger       Strength: 24-26 MG        Qty: 2  LOT: [id number]  Exp.Date: Aug 2025  Dosing instructions: Take one tablet by mouth two times daily  The patient has been instructed regarding the correct time, dose, and frequency of taking this medication, including desired effects and most common side effects.   Simonne Maffucci 10:18 AM 12/19/2022

## 2022-12-19 NOTE — Patient Instructions (Addendum)
Decrease entresto to 24/26mg  twice daily   Stop carvedilol. Begin bisoprolol 2.5mg  daily at bedtime   Hold furosemide (lasix), spironolactone for 2 days.    Check blood pressure at home

## 2022-12-20 LAB — COMPREHENSIVE METABOLIC PANEL
ALT: 21 IU/L (ref 0–44)
AST: 19 IU/L (ref 0–40)
Albumin: 3.6 g/dL — ABNORMAL LOW (ref 3.8–4.8)
Alkaline Phosphatase: 110 IU/L (ref 44–121)
BUN/Creatinine Ratio: 22 (ref 10–24)
BUN: 33 mg/dL — ABNORMAL HIGH (ref 8–27)
Bilirubin Total: 0.9 mg/dL (ref 0.0–1.2)
CO2: 31 mmol/L — ABNORMAL HIGH (ref 20–29)
Calcium: 8.8 mg/dL (ref 8.6–10.2)
Chloride: 99 mmol/L (ref 96–106)
Creatinine, Ser: 1.47 mg/dL — ABNORMAL HIGH (ref 0.76–1.27)
Globulin, Total: 3.1 g/dL (ref 1.5–4.5)
Glucose: 107 mg/dL — ABNORMAL HIGH (ref 70–99)
Potassium: 4.2 mmol/L (ref 3.5–5.2)
Sodium: 147 mmol/L — ABNORMAL HIGH (ref 134–144)
Total Protein: 6.7 g/dL (ref 6.0–8.5)
eGFR: 48 mL/min/{1.73_m2} — ABNORMAL LOW (ref >59)

## 2022-12-20 LAB — CBC
Hematocrit: 43.8 % (ref 37.5–51.0)
Hemoglobin: 14.4 g/dL (ref 13.0–17.7)
MCH: 31.6 pg (ref 26.6–33.0)
MCHC: 32.9 g/dL (ref 31.5–35.7)
MCV: 96 fL (ref 79–97)
Platelets: 200 10*3/uL (ref 150–450)
RBC: 4.56 x10E6/uL (ref 4.14–5.80)
RDW: 11.8 % (ref 11.6–15.4)
WBC: 10.8 10*3/uL (ref 3.4–10.8)

## 2022-12-20 LAB — BRAIN NATRIURETIC PEPTIDE: BNP: 179.3 pg/mL — ABNORMAL HIGH (ref 0.0–100.0)

## 2022-12-21 ENCOUNTER — Encounter: Payer: Self-pay | Admitting: Cardiology

## 2022-12-21 ENCOUNTER — Telehealth: Payer: Self-pay | Admitting: Pharmacist

## 2022-12-21 ENCOUNTER — Ambulatory Visit: Payer: PPO | Attending: Family | Admitting: Cardiology

## 2022-12-21 VITALS — BP 114/56 | HR 57 | Wt 181.4 lb

## 2022-12-21 DIAGNOSIS — I5042 Chronic combined systolic (congestive) and diastolic (congestive) heart failure: Secondary | ICD-10-CM | POA: Diagnosis not present

## 2022-12-21 DIAGNOSIS — I25118 Atherosclerotic heart disease of native coronary artery with other forms of angina pectoris: Secondary | ICD-10-CM

## 2022-12-21 DIAGNOSIS — J431 Panlobular emphysema: Secondary | ICD-10-CM

## 2022-12-21 MED ORDER — FUROSEMIDE 40 MG PO TABS: 40.0000 mg | ORAL_TABLET | ORAL | 3 refills | Status: DC | PRN

## 2022-12-21 MED ORDER — ENTRESTO 24-26 MG PO TABS: 1.0000 | ORAL_TABLET | Freq: Two times a day (BID) | ORAL | 3 refills | Status: DC

## 2022-12-21 MED ORDER — SPIRONOLACTONE 25 MG PO TABS: 12.5000 mg | ORAL_TABLET | Freq: Every day | ORAL | 3 refills | Status: DC

## 2022-12-21 NOTE — Telephone Encounter (Signed)
New Entresto dose of 24-26 mg BID sent in to [facility].

## 2022-12-21 NOTE — Progress Notes (Signed)
Medication Samples have been provided to the patient.  Drug name: entresto       Strength: 24/26 mg        Qty: 2  LOT: [id number]  Exp.Date: AUG2025  Dosing instructions: take one tablet two times a day.  The patient has been instructed regarding the correct time, dose, and frequency of taking this medication, including desired effects and most common side effects.   Electa Sniff 12:22 PM 12/21/2022

## 2022-12-21 NOTE — Patient Instructions (Addendum)
Medication Changes:  Take Lasix only as needed.   Restart taking spironolactone 12.5 mg (0.5 tablet) daily.  Continue taking Entresto 24/26 mg (1 tablet) 2 times  a day.   Special Instructions // Education:  Do the following things EVERYDAY: Weigh yourself in the morning before breakfast. Write it down and keep it in a log. Take your medicines as prescribed Eat low salt foods--Limit salt (sodium) to 2000 mg per day.  Stay as active as you can everyday Limit all fluids for the day to less than 2 liters    Per [employer] still waiting on Medicaid approval.which can take up to two weeks. [employer] will provide portable tanks while waiting for approval. [employer] will provide more tanks today until the approval for the concentrator is approved by Medicaid. [employer] will give you a call to inform you. You may call [employer] directly.  [phone]   Follow-Up in: 3 months with Clarisa Kindred, FNP.    If you have any questions or concerns before your next appointment please send [place] a message through [place] or call our office at [phone] Monday-Friday 8 am-5 pm.   If you have an urgent need after hours on the weekend please call your Primary Cardiologist or the Advanced Heart Failure Clinic in [place] at [phone].       If you have any questions or concerns before your next appointment please send [place] a message through [place] or call our office at [phone] Monday-Friday 8 am-5 pm.   If you have an urgent need after hours on the weekend please call your Primary Cardiologist or the Advanced Heart Failure Clinic in [place] at [phone].

## 2022-12-21 NOTE — Progress Notes (Signed)
Spoke with Jonetta Speak, RN regarding pts concerns with his oxygen tank.  Pt stated feeling anxious to come to his appt since he only has one oxygen tank. He might run low on oxygen.  Stated not been informed by [employer] if they will bring more oxygen tanks.   Lucendia Herrlich was able to assist.   She was able to speak with [employer]. She was informed that still waiting for Medicaid approval.which can take up to 2 weeks.  [employer] to provide portable tanks while pt awaits for approval. [employer] to provide the pt with more tanks today  until approval for the concentrator.   Pt aware, agreeable, and verbalized understanding

## 2022-12-21 NOTE — Progress Notes (Signed)
ADVANCED HEART FAILURE FOLLOW UP CLINIC NOTE  Referring Physician: Danella Penton, MD  Primary Care: Danella Penton, MD Primary Cardiologist:  HPI: [name] is a 77 y.o. male with a history of COPD stage II, anxiety, stroke, neuromuscular disorder,depression, HTN, prostate cancer s/p seed implantation, hyperlipidemia, lumbar disc disease, CAD with history of posterior MI in 2002 status post BMS to the LCx status post PCI/BMS to the LAD in 2012, AAA status post endovascular repair in 12/2017 followed by vascular surgery, intraparenchymal hemorrhage in 02/2021, pulmonary HTN, nephrolithiasis, GERD, tobacco use and chronic ischemic heart failure.     He underwent Myoview in 2010 that was negative for ischemia with an EF of 60%. In 2012, nuclear stress testing showed anterior wall ischemia with an estimated EF of 26%. Follow-up cardiac cath on 01/05/2011 showed 95% stenosis of the proximal LAD, 95% stenosis of the mid LAD, 30% in-stent restenosis of the mid left circumflex with a second lesion of diffuse 50% stenosis.  The patient underwent successful PCI/BMS to the mid LAD with 0% residual stenosis    Admitted to the hospital in 04/2021 with acute on chronic combined CHF and elevated troponin peaking at 448, felt to be related to demand ischemia.  Was improved with IV diuresis. Echo in 04/2021 demonstrating an EF of 30 to 35% with global hypokinesis, mild, grade 1 diastolic dysfunction, normal RV systolic function with mildly enlarged cavity size, PASP 54.5 mmHg, mildly dilated left atrium, severely dilated right atrium and estimated right atrial pressure of 8 mmHg.    Admitted 12/12/22 due to new onset of productive cough, shortness of breath and wheezing for 3 days prior to presentation. In the ED patient was hypoxic with pulse ox of 80% on room air. Chest x-ray showed pulmonary vascular congestion. Given IV lasix. Echocardiogram this admission shows LV ejection fraction of 40 to 45% with global  hypokinesis. Improvement in ejection fraction compared to echo from 04/2021. Qualified for home oxygen.  He was seen recently in clinic on 9/3 at which time his BP in his left arm read with systolics in the 50s. BP on the right with systolic in the 80s. His furosemide was held, GDMT decreased, and he was arranged to have close follow up.        SUBJECTIVE: Reports that he is feeling better after the medication changes made during his last visit.  He no longer has dizziness upon standing and reports that his shortness of breath is better.  He is not very active during the day, but is able to complete his ADLs.  He denies any recent swelling or change in weight.  We discussed his potential left subclavian stenosis, but he denies any dizziness, recent syncope, or symptoms upon movement of his left arm.  PMH, current medications, allergies, social history, and family history reviewed in epic.  PHYSICAL EXAM: Vitals:   12/21/22 1133  BP: (!) 114/56  Pulse: (!) 57  SpO2: 97%   GENERAL: Well nourished and in no apparent distress at rest.  HEENT: Negative for xanthelasma. There is no scleral icterus.  The mucous membranes are pink and moist.   CHEST: There are no chest wall deformities. There is no chest wall tenderness. Respirations are unlabored.  Lungs- Diminished breath sounds bilaterally CARDIAC:  JVP: not visible         Normal rate and rhythm, no m/g/r, no LE edema ABDOMEN: Soft, non-tender, non-distended. [name]  EXTREMITIES: Warm and well perfused with no cyanosis, clubbing.  NEUROLOGIC: Patient is oriented x3 with no focal or lateralizing neurologic deficits.  PSYCH: Patients affect is appropriate, there is no evidence of anxiety or depression.  SKIN: Warm and dry; no lesions or wounds.   DATA REVIEW  Echo 12/25/17: EF 30-35% grade 1 diastolic dysfunction, mildly dilated aortic root at 39 mm, mild mitral regurgitation, mildly dilated left atrium and normal pulmonary arterial  pressure. Echo 10/25/18: EF 30-35% with mild LVH Echo 06/24/20: EF 30-35% with Grade 1 DD Echo 02/21/21: EF 40-45% with mild LVH, Grade I DD Echo 04/25/21: EF 30-35% with Grade I DD, mild LVH, moderately elevated PA pressure of 54.5 mmHg, mild MR Echo 12/12/22: EF 40-45% with moderate LVH, Grade I DD, normal PA Pressure with mild MR   LHC 01/05/11: 95% stenosis of the proximal LAD, 95% stenosis of the mid LAD, 30% in-stent restenosis of the mid left circumflex with a second lesion of diffuse 50% stenosis. Underwent successful PCI/BMS to the mid LAD with 0% residual stenosis .   Etiology of ZO:[id number] NYHA class / AHA Stage:III Volume status & Diuretics: Euvolemic to hypovolemic Vasodilators:Entresto 24/26 Beta-Blocker: Bisprolol MRA: Sprionolactone 12.5mg  daily Cardiometabolic:None Devices therapies & Valvulopathies:Not needed Advanced therapies:Not a candidate due to lung disease   ASSESSMENT & PLAN:  1: Ischemic heart failure with mildly reduced ejection fraction- - Echo 12/12/22: EF 40-45% with moderate LVH, Grade I DD, normal PA Pressure with mild MR - Improved BP and symptoms with medication changes at last appointment - His SOA is more likely secondary to his advanced COPD than his HF given his exam, BNP, and response to holding diuretics - Furosemide 40mg  changed to prn for weight gain or orthopnea - Restart spironolactone 12.5mg  daily - Continue bisoprolol 2.5mg  daily and Entresto 24/26 twice daily - BNP 12/11/21 was 926.2  - Recheck labs given creatinine bump on 9/11, but likely secondary to overdiuresis and relative hypotension  2: HTN- Blood pressure with systolics in the 50s on his left arm at last appointment.  Improved today with medication changes.  May have some element of subclavian stenosis but given lack of symptoms and advanced COPD we will monitor at this time. -SBP 110s today -Continue medications as above   3: CAD- - saw cardiology ([facility]) 09/23 - continue ASA  81mg  daily - continue atorvastatin 80mg  daily   4: COPD- - wearing oxygen at 2-3L around the clock - continues to use inhalers - continues to smoke but takes his oxygen off and doesn't smoke anywhere near the oxygen  Follow up in 3 months  Clearnce Hasten, MD Advanced Heart Failure Mechanical Circulatory Support 12/21/22

## 2022-12-22 ENCOUNTER — Encounter: Payer: PPO | Admitting: Family

## 2022-12-22 DIAGNOSIS — I5040 Unspecified combined systolic (congestive) and diastolic (congestive) heart failure: Secondary | ICD-10-CM | POA: Diagnosis not present

## 2022-12-22 DIAGNOSIS — J449 Chronic obstructive pulmonary disease, unspecified: Secondary | ICD-10-CM | POA: Diagnosis not present

## 2022-12-29 DIAGNOSIS — E782 Mixed hyperlipidemia: Secondary | ICD-10-CM | POA: Diagnosis not present

## 2023-01-03 DIAGNOSIS — J9611 Chronic respiratory failure with hypoxia: Secondary | ICD-10-CM | POA: Diagnosis not present

## 2023-01-03 DIAGNOSIS — E782 Mixed hyperlipidemia: Secondary | ICD-10-CM | POA: Diagnosis not present

## 2023-01-03 DIAGNOSIS — I420 Dilated cardiomyopathy: Secondary | ICD-10-CM | POA: Diagnosis not present

## 2023-01-03 DIAGNOSIS — Z125 Encounter for screening for malignant neoplasm of prostate: Secondary | ICD-10-CM | POA: Diagnosis not present

## 2023-01-03 DIAGNOSIS — Z23 Encounter for immunization: Secondary | ICD-10-CM | POA: Diagnosis not present

## 2023-01-03 DIAGNOSIS — R739 Hyperglycemia, unspecified: Secondary | ICD-10-CM | POA: Diagnosis not present

## 2023-01-03 DIAGNOSIS — J40 Bronchitis, not specified as acute or chronic: Secondary | ICD-10-CM | POA: Diagnosis not present

## 2023-01-16 ENCOUNTER — Encounter: Payer: Self-pay | Admitting: Emergency Medicine

## 2023-01-16 ENCOUNTER — Inpatient Hospital Stay
Admission: EM | Admit: 2023-01-16 | Discharge: 2023-01-18 | DRG: 291 | Disposition: A | Discharge status: Home Health | Payer: PPO | Attending: Internal Medicine | Admitting: Internal Medicine

## 2023-01-16 ENCOUNTER — Emergency Department: Payer: PPO

## 2023-01-16 ENCOUNTER — Other Ambulatory Visit: Payer: Self-pay

## 2023-01-16 DIAGNOSIS — K219 Gastro-esophageal reflux disease without esophagitis: Secondary | ICD-10-CM | POA: Diagnosis not present

## 2023-01-16 DIAGNOSIS — I272 Pulmonary hypertension, unspecified: Secondary | ICD-10-CM | POA: Diagnosis present

## 2023-01-16 DIAGNOSIS — Z888 Allergy status to other drugs, medicaments and biological substances status: Secondary | ICD-10-CM

## 2023-01-16 DIAGNOSIS — J449 Chronic obstructive pulmonary disease, unspecified: Secondary | ICD-10-CM | POA: Diagnosis present

## 2023-01-16 DIAGNOSIS — Z9861 Coronary angioplasty status: Secondary | ICD-10-CM

## 2023-01-16 DIAGNOSIS — I739 Peripheral vascular disease, unspecified: Secondary | ICD-10-CM | POA: Diagnosis present

## 2023-01-16 DIAGNOSIS — Z7951 Long term (current) use of inhaled steroids: Secondary | ICD-10-CM

## 2023-01-16 DIAGNOSIS — T501X6A Underdosing of loop [high-ceiling] diuretics, initial encounter: Secondary | ICD-10-CM | POA: Diagnosis present

## 2023-01-16 DIAGNOSIS — E785 Hyperlipidemia, unspecified: Secondary | ICD-10-CM | POA: Diagnosis present

## 2023-01-16 DIAGNOSIS — Z8679 Personal history of other diseases of the circulatory system: Secondary | ICD-10-CM

## 2023-01-16 DIAGNOSIS — I771 Stricture of artery: Secondary | ICD-10-CM | POA: Diagnosis not present

## 2023-01-16 DIAGNOSIS — I5043 Acute on chronic combined systolic (congestive) and diastolic (congestive) heart failure: Secondary | ICD-10-CM | POA: Diagnosis present

## 2023-01-16 DIAGNOSIS — Z79899 Other long term (current) drug therapy: Secondary | ICD-10-CM

## 2023-01-16 DIAGNOSIS — J9622 Acute and chronic respiratory failure with hypercapnia: Secondary | ICD-10-CM | POA: Diagnosis present

## 2023-01-16 DIAGNOSIS — J9611 Chronic respiratory failure with hypoxia: Secondary | ICD-10-CM | POA: Diagnosis not present

## 2023-01-16 DIAGNOSIS — Z8546 Personal history of malignant neoplasm of prostate: Secondary | ICD-10-CM

## 2023-01-16 DIAGNOSIS — R0602 Shortness of breath: Secondary | ICD-10-CM | POA: Diagnosis not present

## 2023-01-16 DIAGNOSIS — F32A Depression, unspecified: Secondary | ICD-10-CM | POA: Diagnosis not present

## 2023-01-16 DIAGNOSIS — I2489 Other forms of acute ischemic heart disease: Secondary | ICD-10-CM | POA: Diagnosis not present

## 2023-01-16 DIAGNOSIS — I25118 Atherosclerotic heart disease of native coronary artery with other forms of angina pectoris: Secondary | ICD-10-CM | POA: Diagnosis present

## 2023-01-16 DIAGNOSIS — Z91148 Patient's other noncompliance with medication regimen for other reason: Secondary | ICD-10-CM

## 2023-01-16 DIAGNOSIS — Z1152 Encounter for screening for COVID-19: Secondary | ICD-10-CM

## 2023-01-16 DIAGNOSIS — J441 Chronic obstructive pulmonary disease with (acute) exacerbation: Secondary | ICD-10-CM | POA: Diagnosis present

## 2023-01-16 DIAGNOSIS — Z8673 Personal history of transient ischemic attack (TIA), and cerebral infarction without residual deficits: Secondary | ICD-10-CM

## 2023-01-16 DIAGNOSIS — Z8249 Family history of ischemic heart disease and other diseases of the circulatory system: Secondary | ICD-10-CM

## 2023-01-16 DIAGNOSIS — F17210 Nicotine dependence, cigarettes, uncomplicated: Secondary | ICD-10-CM | POA: Diagnosis present

## 2023-01-16 DIAGNOSIS — J9601 Acute respiratory failure with hypoxia: Secondary | ICD-10-CM | POA: Diagnosis present

## 2023-01-16 DIAGNOSIS — I493 Ventricular premature depolarization: Secondary | ICD-10-CM | POA: Diagnosis present

## 2023-01-16 DIAGNOSIS — D696 Thrombocytopenia, unspecified: Secondary | ICD-10-CM | POA: Diagnosis not present

## 2023-01-16 DIAGNOSIS — Z87442 Personal history of urinary calculi: Secondary | ICD-10-CM

## 2023-01-16 DIAGNOSIS — I252 Old myocardial infarction: Secondary | ICD-10-CM

## 2023-01-16 DIAGNOSIS — J9621 Acute and chronic respiratory failure with hypoxia: Secondary | ICD-10-CM | POA: Diagnosis present

## 2023-01-16 DIAGNOSIS — I10 Essential (primary) hypertension: Secondary | ICD-10-CM | POA: Diagnosis not present

## 2023-01-16 DIAGNOSIS — F419 Anxiety disorder, unspecified: Secondary | ICD-10-CM | POA: Diagnosis present

## 2023-01-16 DIAGNOSIS — Z720 Tobacco use: Secondary | ICD-10-CM | POA: Diagnosis present

## 2023-01-16 DIAGNOSIS — I110 Hypertensive heart disease with heart failure: Principal | ICD-10-CM | POA: Diagnosis present

## 2023-01-16 DIAGNOSIS — I700 Atherosclerosis of aorta: Secondary | ICD-10-CM | POA: Diagnosis not present

## 2023-01-16 DIAGNOSIS — Z9981 Dependence on supplemental oxygen: Secondary | ICD-10-CM | POA: Diagnosis not present

## 2023-01-16 DIAGNOSIS — Z7982 Long term (current) use of aspirin: Secondary | ICD-10-CM

## 2023-01-16 DIAGNOSIS — J9612 Chronic respiratory failure with hypercapnia: Secondary | ICD-10-CM | POA: Diagnosis not present

## 2023-01-16 DIAGNOSIS — J431 Panlobular emphysema: Secondary | ICD-10-CM | POA: Diagnosis not present

## 2023-01-16 LAB — COMPREHENSIVE METABOLIC PANEL
ALT: 26 U/L (ref 0–44)
AST: 22 U/L (ref 15–41)
Albumin: 3.2 g/dL — ABNORMAL LOW (ref 3.5–5.0)
Alkaline Phosphatase: 84 U/L (ref 38–126)
Anion gap: 6 (ref 5–15)
BUN: 16 mg/dL (ref 8–23)
CO2: 32 mmol/L (ref 22–32)
Calcium: 8.4 mg/dL — ABNORMAL LOW (ref 8.9–10.3)
Chloride: 105 mmol/L (ref 98–111)
Creatinine, Ser: 0.94 mg/dL (ref 0.61–1.24)
GFR, Estimated: >60 mL/min (ref >60)
Glucose, Bld: 126 mg/dL — ABNORMAL HIGH (ref 70–99)
Potassium: 3.8 mmol/L (ref 3.5–5.1)
Sodium: 143 mmol/L (ref 135–145)
Total Bilirubin: 1 mg/dL (ref 0.3–1.2)
Total Protein: 6.7 g/dL (ref 6.5–8.1)

## 2023-01-16 LAB — CBC
HCT: 39.7 % (ref 39.0–52.0)
Hemoglobin: 12.4 g/dL — ABNORMAL LOW (ref 13.0–17.0)
MCH: 31.7 pg (ref 26.0–34.0)
MCHC: 31.2 g/dL (ref 30.0–36.0)
MCV: 101.5 fL — ABNORMAL HIGH (ref 80.0–100.0)
Platelets: 132 10*3/uL — ABNORMAL LOW (ref 150–400)
RBC: 3.91 MIL/uL — ABNORMAL LOW (ref 4.22–5.81)
RDW: 12.8 % (ref 11.5–15.5)
WBC: 7.4 10*3/uL (ref 4.0–10.5)
nRBC: 0 % (ref 0.0–0.2)

## 2023-01-16 LAB — BRAIN NATRIURETIC PEPTIDE: B Natriuretic Peptide: 2150.1 pg/mL — ABNORMAL HIGH (ref 0.0–100.0)

## 2023-01-16 LAB — SARS CORONAVIRUS 2 BY RT PCR: SARS Coronavirus 2 by RT PCR: NEGATIVE

## 2023-01-16 MED ORDER — SACUBITRIL-VALSARTAN 24-26 MG PO TABS
1.0000 | ORAL_TABLET | Freq: Two times a day (BID) | ORAL | Status: DC
  Administered 2023-01-16 – 2023-01-18 (×4): 1 via ORAL
  Filled 2023-01-16 (×5): qty 1

## 2023-01-16 MED ORDER — FUROSEMIDE 10 MG/ML IJ SOLN
40.0000 mg | Freq: Two times a day (BID) | INTRAMUSCULAR | Status: DC
  Administered 2023-01-16: 40 mg via INTRAVENOUS
  Filled 2023-01-16 (×2): qty 4

## 2023-01-16 MED ORDER — PANTOPRAZOLE SODIUM 40 MG PO TBEC
40.0000 mg | DELAYED_RELEASE_TABLET | Freq: Every day | ORAL | Status: DC
  Administered 2023-01-16 – 2023-01-18 (×3): 40 mg via ORAL
  Filled 2023-01-16 (×3): qty 1

## 2023-01-16 MED ORDER — POTASSIUM CHLORIDE CRYS ER 20 MEQ PO TBCR
20.0000 meq | EXTENDED_RELEASE_TABLET | Freq: Once | ORAL | Status: AC
  Administered 2023-01-16: 20 meq via ORAL
  Filled 2023-01-16: qty 1

## 2023-01-16 MED ORDER — IPRATROPIUM-ALBUTEROL 0.5-2.5 (3) MG/3ML IN SOLN
3.0000 mL | Freq: Once | RESPIRATORY_TRACT | Status: AC
  Administered 2023-01-16: 3 mL via RESPIRATORY_TRACT
  Filled 2023-01-16: qty 3

## 2023-01-16 MED ORDER — FLUTICASONE FUROATE-VILANTEROL 200-25 MCG/ACT IN AEPB
1.0000 | INHALATION_SPRAY | Freq: Every day | RESPIRATORY_TRACT | Status: DC
  Administered 2023-01-17 – 2023-01-18 (×2): 1 via RESPIRATORY_TRACT
  Filled 2023-01-16: qty 28

## 2023-01-16 MED ORDER — FUROSEMIDE 10 MG/ML IJ SOLN: 40.0000 mg | Freq: Two times a day (BID) | INTRAMUSCULAR | Status: DC

## 2023-01-16 MED ORDER — GUAIFENESIN ER 600 MG PO TB12
600.0000 mg | ORAL_TABLET | Freq: Two times a day (BID) | ORAL | Status: DC
  Administered 2023-01-16 – 2023-01-18 (×5): 600 mg via ORAL
  Filled 2023-01-16 (×5): qty 1

## 2023-01-16 MED ORDER — DAPAGLIFLOZIN PROPANEDIOL 10 MG PO TABS
10.0000 mg | ORAL_TABLET | Freq: Every day | ORAL | Status: DC
  Administered 2023-01-16 – 2023-01-18 (×3): 10 mg via ORAL
  Filled 2023-01-16 (×4): qty 1

## 2023-01-16 MED ORDER — ACETAMINOPHEN 325 MG PO TABS
650.0000 mg | ORAL_TABLET | Freq: Four times a day (QID) | ORAL | Status: DC | PRN
  Administered 2023-01-16 – 2023-01-18 (×2): 650 mg via ORAL
  Filled 2023-01-16 (×2): qty 2

## 2023-01-16 MED ORDER — IPRATROPIUM-ALBUTEROL 0.5-2.5 (3) MG/3ML IN SOLN: 3.0000 mL | RESPIRATORY_TRACT | Status: DC | PRN

## 2023-01-16 MED ORDER — POLYETHYLENE GLYCOL 3350 17 G PO PACK: 17.0000 g | PACK | Freq: Every day | ORAL | Status: DC | PRN

## 2023-01-16 MED ORDER — FUROSEMIDE 10 MG/ML IJ SOLN
80.0000 mg | Freq: Once | INTRAMUSCULAR | Status: AC
  Administered 2023-01-16: 80 mg via INTRAVENOUS
  Filled 2023-01-16: qty 8

## 2023-01-16 MED ORDER — ACETAMINOPHEN 650 MG RE SUPP: 650.0000 mg | Freq: Four times a day (QID) | RECTAL | Status: DC | PRN

## 2023-01-16 MED ORDER — ONDANSETRON HCL 4 MG PO TABS: 4.0000 mg | ORAL_TABLET | Freq: Four times a day (QID) | ORAL | Status: DC | PRN

## 2023-01-16 MED ORDER — ATORVASTATIN CALCIUM 80 MG PO TABS
80.0000 mg | ORAL_TABLET | Freq: Every day | ORAL | Status: DC
  Administered 2023-01-16 – 2023-01-18 (×3): 80 mg via ORAL
  Filled 2023-01-16: qty 1
  Filled 2023-01-16 (×2): qty 4

## 2023-01-16 MED ORDER — ALBUTEROL SULFATE (2.5 MG/3ML) 0.083% IN NEBU: 2.5000 mg | INHALATION_SOLUTION | RESPIRATORY_TRACT | Status: DC | PRN

## 2023-01-16 MED ORDER — ADULT MULTIVITAMIN W/MINERALS CH
ORAL_TABLET | Freq: Every day | ORAL | Status: DC
  Administered 2023-01-16 – 2023-01-18 (×3): 1 via ORAL
  Filled 2023-01-16 (×3): qty 1

## 2023-01-16 MED ORDER — ONDANSETRON HCL 4 MG/2ML IJ SOLN: 4.0000 mg | Freq: Four times a day (QID) | INTRAMUSCULAR | Status: DC | PRN

## 2023-01-16 MED ORDER — SPIRONOLACTONE 12.5 MG HALF TABLET
12.5000 mg | ORAL_TABLET | Freq: Every day | ORAL | Status: DC
  Administered 2023-01-17: 12.5 mg via ORAL
  Filled 2023-01-16: qty 1

## 2023-01-16 MED ORDER — BISOPROLOL FUMARATE 5 MG PO TABS
2.5000 mg | ORAL_TABLET | Freq: Every day | ORAL | Status: DC
  Administered 2023-01-16 – 2023-01-18 (×3): 2.5 mg via ORAL
  Filled 2023-01-16 (×3): qty 0.5

## 2023-01-16 MED ORDER — MELATONIN 5 MG PO TABS: 5.0000 mg | ORAL_TABLET | Freq: Every evening | ORAL | Status: DC | PRN

## 2023-01-16 NOTE — H&P (Addendum)
History and Physical    Patient: [name] MVH:[id number] DOB: 11-Sep-1945 DOA: 01/16/2023 DOS: the patient was seen and examined on 01/16/2023 PCP: Danella Penton, MD  Patient coming from: Home  Chief Complaint:  Chief Complaint  Patient presents with   Shortness of Breath   HPI: [name] is a 77 y.o. male with medical history significant of COPD GOLD stage II, chronic hypoxic and hypercapnic respiratory failure on 4 L O2 via nasal cannula at home, anxiety/depression, CAD, MI s/p stenting, chronic HFrEF LVEF 40-45% 11/2022, stroke, neuromuscular disorder,depression, HTN, prostate cancer s/p seed implantation, AAA s/p endovascular repair in 12/2017 , intraparenchymal hemorrhage in 02/2021, pulmonary HTN, nephrolithiasis, GERD, and tobacco use.  He presents to [facility] ED today after experiencing worsening shortness of breath for the last several days.  Son reports last night breathing worsened and patient did not sleep at all which is abnormal for him.  Patient reports he has not been taking his Lasix because he thought it was stopped, review of records shows that Lasix was changed to PRN during recent advanced heart failure visit on 12/21/2022. Son reports ~ 6 lb weight gain over the last week when weighed at home during home health nursing visits.  Patient and son denies any recent swelling or edema.  ED Course: On arrival to [facility] ED patient was noted to be afebrile temp 37.1C, BP 158/62, HR 68, RR 18, SpO2 94% on 6 L via nasal cannula.  CXR obtained and does not show pulmonary edema or infiltrates.  BNP 2150.1, platelets 132 down from 200 31-month ago, albumin 3.2, glucose slightly elevated at 126, and COVID testing negative.  Patient was given 80 mg of IV Lasix and 2 DuoNeb nebulizers.  TRH contacted for admission.   Review of Systems: As mentioned in the history of present illness. All other systems reviewed and are negative. Past Medical History:  Diagnosis Date   AAA  (abdominal aortic aneurysm) (HCC)    a.  CTA 7/19: measured 4.5 x 5.3 cm and greatest transverse dimensions   Abnormal nuclear stress test    a.  Myoview 2012: anterior wall ischemia with an estimated EF of 26%.  This was a new wall motion abnormality as well as a newly reduced EF   Brain bleed (HCC)    COPD (chronic obstructive pulmonary disease) (HCC)    Coronary artery disease    a.  Posterior MI in 2002 status post BMS to the LCx; b. LHC 2012: 95% stenosis of the proximal LAD, 95% stenosis of the mid LAD, 30% in-stent restenosis of the mid left circumflex with a second lesion of diffuse 50% stenosis.  The patient underwent successful PCI/BMS to the mid LAD with 0% residual stenosis, LV gram not performed   GERD (gastroesophageal reflux disease)    History of kidney stones    History of prostate cancer    Hyperlipidemia    Hypertension    Myocardial infarction [facility])    Neuromuscular disorder (HCC)    Prostate cancer (HCC)    a.  Status post seeding   PVD (peripheral vascular disease) (HCC)    Stroke [facility])    Past Surgical History:  Procedure Laterality Date   BACK SURGERY  2009/2010   x 2   BOWEL RESECTION N/A 03/18/2021   Procedure: SMALL BOWEL RESECTION;  Surgeon: Diamantina Monks, MD;  Location: [facility] OR;  Service: General;  Laterality: N/A;   CARDIAC CATHETERIZATION  2002   stent placement [place]  ENDOVASCULAR REPAIR/STENT GRAFT N/A 01/02/2018   Procedure: ENDOVASCULAR REPAIR/STENT GRAFT;  Surgeon: Annice Needy, MD;  Location: [facility] INVASIVE CV LAB;  Service: Cardiovascular;  Laterality: N/A;   FOOT SURGERY     bilateral    HERNIA REPAIR     bilateral    INGUINAL HERNIA REPAIR Left 03/18/2021   Procedure: INGUINAL HERNIA REPAIR;  Surgeon: Diamantina Monks, MD;  Location: [facility] OR;  Service: General;  Laterality: Left;   INSERTION OF MESH Left 03/18/2021   Procedure: INSERTION OF MESH;  Surgeon: Diamantina Monks, MD;  Location: [facility] OR;  Service: General;  Laterality: Left;   KNEE  SURGERY     right knee    PROSTATE SURGERY  09/2009   prostate implant   SPINE SURGERY     UMBILICAL HERNIA REPAIR N/A 03/18/2021   Procedure: UMBILICAL HERNIA REPAIR;  Surgeon: Diamantina Monks, MD;  Location: [facility] OR;  Service: General;  Laterality: N/A;   Social History:  reports that he has been smoking cigarettes. He has a 14 pack-year smoking history. He has never used smokeless tobacco. He reports that he does not drink alcohol and does not use drugs.  Allergies  Allergen Reactions   Lyrica [Pregabalin] Swelling         Family History  Problem Relation Age of Onset   Heart attack Mother    Diabetes Mother    Heart disease Father    Lung cancer Daughter     Prior to Admission medications   Medication Sig Start Date End Date Taking? Authorizing Provider  acetaminophen (TYLENOL) 325 MG tablet Take 2 tablets (650 mg total) by mouth every 6 (six) hours as needed for mild pain (or Fever >/= 101). 04/28/21   Elgergawy, Leana Roe, MD  albuterol (VENTOLIN HFA) 108 (90 Base) MCG/ACT inhaler Inhale 2 puffs into the lungs every 6 (six) hours as needed for wheezing. 04/07/21 11/30/24  Medina-Vargas, Monina C, NP  aspirin EC 81 MG tablet Take 81 mg by mouth daily.    [provider]  atorvastatin (LIPITOR) 80 MG tablet Take 1 tablet (80 mg total) by mouth daily. 04/07/21   Medina-Vargas, Monina C, NP  bisoprolol (ZEBETA) 5 MG tablet Take 0.5 tablets (2.5 mg total) by mouth daily. 12/19/22   Delma Freeze, FNP  budesonide-formoterol (SYMBICORT) 160-4.5 MCG/ACT inhaler Inhale 2 puffs into the lungs 2 (two) times daily. 04/07/21   Medina-Vargas, Monina C, NP  fluticasone furoate-vilanterol (BREO ELLIPTA) 200-25 MCG/ACT AEPB Inhale 1 puff into the lungs daily. 06/21/22   [provider]  furosemide (LASIX) 40 MG tablet Take 1 tablet (40 mg total) by mouth as needed. Take 1 tablet twice a day for 3 days then continue daily. 12/21/22   Romie Minus, MD  mirtazapine (REMERON) 7.5 MG  tablet Take 7.5 mg by mouth at bedtime. 12/19/21 12/19/22  [provider]  Multiple Vitamins-Minerals (MULTIVITAMIN WITH MINERALS) tablet Take 1 tablet by mouth daily.    [provider]  nicotine (NICODERM CQ - DOSED IN MG/24 HOURS) 14 mg/24hr patch Place 1 patch (14 mg total) onto the skin daily. 12/15/22   Pokhrel, Rebekah Chesterfield, MD  omeprazole (PRILOSEC) 40 MG capsule TAKE 1 CAPSULE BY MOUTH DAILY USUALLY BEFORE BREAKFAST Patient taking differently: Take 40 mg by mouth daily. BEFORE BREAKFAST 04/07/21   Medina-Vargas, Monina C, NP  sacubitril-valsartan (ENTRESTO) 24-26 MG Take 1 tablet by mouth 2 (two) times daily. 12/21/22   Romie Minus, MD  spironolactone (ALDACTONE)  25 MG tablet Take 0.5 tablets (12.5 mg total) by mouth daily. FOR CHRONIC COMBINED CHF 12/21/22   Romie Minus, MD    Physical Exam: Vitals:   01/16/23 0931 01/16/23 0938 01/16/23 1030  BP: (!) 158/62  130/82  Pulse: 68  64  Resp: 18  20  Temp: 98.7 F (37.1 C)    TempSrc: Oral    SpO2: 94%  100%  Weight:  82.3 kg   Height:  6' (1.829 m)     Constitutional: Chronically Ill Appearing Gentleman Eyes: PERRL, lids and conjunctivae normal Neck: normal, supple, no masses, no thyromegaly Respiratory: Bibasilar crackles, Increased respiratory effort.  Cardiovascular: Regular rate and rhythm, no murmurs / rubs / gallops. No extremity edema. 1+ radial and pedal pulses. No carotid bruits.  Abdomen: no tenderness, no masses palpated. No hepatosplenomegaly. Bowel sounds positive.  Musculoskeletal: no clubbing / cyanosis. No joint deformity upper and lower extremities. Good ROM, no contractures. Normal muscle tone.  Skin: no rashes, lesions, ulcers.  Neurologic:  Alert and oriented x 3. Normal mood.   Data Reviewed: CBC    Component Value Date/Time   WBC 7.4 01/16/2023 0940   RBC 3.91 (L) 01/16/2023 0940   HGB 12.4 (L) 01/16/2023 0940   HGB 14.4 12/19/2022 1039   HCT 39.7 01/16/2023  0940   HCT 43.8 12/19/2022 1039   PLT 132 (L) 01/16/2023 0940   PLT 200 12/19/2022 1039   MCV 101.5 (H) 01/16/2023 0940   MCV 96 12/19/2022 1039   MCV 93 02/27/2012 1406   MCH 31.7 01/16/2023 0940   MCHC 31.2 01/16/2023 0940   RDW 12.8 01/16/2023 0940   RDW 11.8 12/19/2022 1039   RDW 12.9 02/27/2012 1406   LYMPHSABS 1.1 04/24/2021 0645   LYMPHSABS 2.1 02/27/2012 1406   MONOABS 0.5 04/24/2021 0645   MONOABS 0.6 02/27/2012 1406   EOSABS 0.3 04/24/2021 0645   EOSABS 0.4 02/27/2012 1406   BASOSABS 0.0 04/24/2021 0645   BASOSABS 0.1 02/27/2012 1406   CMP     Component Value Date/Time   NA 143 01/16/2023 0940   NA 147 (H) 12/19/2022 1039   NA 145 04/23/2011 0252   K 3.8 01/16/2023 0940   K 3.9 04/23/2011 0252   CL 105 01/16/2023 0940   CL 107 04/23/2011 0252   CO2 32 01/16/2023 0940   CO2 31 04/23/2011 0252   GLUCOSE 126 (H) 01/16/2023 0940   GLUCOSE 93 04/23/2011 0252   BUN 16 01/16/2023 0940   BUN 33 (H) 12/19/2022 1039   BUN 16 04/23/2011 0252   CREATININE 0.94 01/16/2023 0940   CREATININE 1.12 10/03/2011 1625   CALCIUM 8.4 (L) 01/16/2023 0940   CALCIUM 8.6 04/23/2011 0252   PROT 6.7 01/16/2023 0940   PROT 6.7 12/19/2022 1039   PROT 6.2 (L) 04/21/2011 1137   ALBUMIN 3.2 (L) 01/16/2023 0940   ALBUMIN 3.6 (L) 12/19/2022 1039   ALBUMIN 3.2 (L) 04/23/2011 0252   AST 22 01/16/2023 0940   AST 33 04/21/2011 1137   ALT 26 01/16/2023 0940   ALT 22 04/21/2011 1137   ALKPHOS 84 01/16/2023 0940   ALKPHOS 111 04/21/2011 1137   BILITOT 1.0 01/16/2023 0940   BILITOT 0.9 12/19/2022 1039   BILITOT 1.0 04/21/2011 1137   GFR 67.38 10/25/2018 0822   EGFR 48 (L) 12/19/2022 1039   GFRNONAA >60 01/16/2023 0940   GFRNONAA 68 10/03/2011 1625   DG Chest 2 View  Result Date: 01/16/2023 CLINICAL DATA:  sob EXAM: CHEST -  2 VIEW COMPARISON:  12/14/2022. FINDINGS: Cardiac silhouette enlarged. No evidence of pneumothorax or pleural effusion. No evidence of pulmonary edema. Lungs are  hyperinflated consistent with COPD. Aorta is calcified and tortuous. Osseous structures are osteopenic. IMPRESSION: Enlarged cardiac silhouette. Hyperinflated lungs consistent with COPD. Electronically Signed   By: Layla Maw M.D.   On: 01/16/2023 10:38       EKG Interpretation: NSR with PVC, HR 63     BNP (last 3 results) Recent Labs    12/12/22 0616 12/19/22 1039 01/16/23 0940  BNP 926.2* 179.3* 2,150.1*    Results for orders placed or performed during the hospital encounter of 01/16/23  SARS Coronavirus 2 by RT PCR (hospital order, performed in [facility] hospital lab) *cepheid single result test* Anterior Nasal Swab     Status: None   Collection Time: 01/16/23  9:40 AM   Specimen: Anterior Nasal Swab  Result Value Ref Range Status   SARS Coronavirus 2 by RT PCR NEGATIVE NEGATIVE Final    Comment: (NOTE) SARS-CoV-2 target nucleic acids are NOT DETECTED.  The SARS-CoV-2 RNA is generally detectable in upper and lower respiratory specimens during the acute phase of infection. The lowest concentration of SARS-CoV-2 viral copies this assay can detect is 250 copies / mL. A negative result does not preclude SARS-CoV-2 infection and should not be used as the sole basis for treatment or other patient management decisions.  A negative result may occur with improper specimen collection / handling, submission of specimen other than nasopharyngeal swab, presence of viral mutation(s) within the areas targeted by this assay, and inadequate number of viral copies (<250 copies / mL). A negative result must be combined with clinical observations, patient history, and epidemiological information.  Fact Sheet for Patients:   RoadLapTop.co.za  Fact Sheet for Healthcare Providers: http://kim-miller.com/  This test is not yet approved or  cleared by the [place] FDA and has been authorized for detection and/or diagnosis of SARS-CoV-2  by FDA under an Emergency Use Authorization (EUA).  This EUA will remain in effect (meaning this test can be used) for the duration of the COVID-19 declaration under Section 564(b)(1) of the Act, 21 U.S.C. section 360bbb-3(b)(1), unless the authorization is terminated or revoked sooner.  Performed at [facility], [address] [place], [place] [address]     Assessment and Plan: #Acute on Chronic Hypoxic Respiratory Failure secondary to CHF exacerbation; Chronic COPD contributory Now on 6L O2 via nasal cannula, was on 4L at home - As below  #Acute on chronic combined systolic and diastolic CHF - IV Lasix 40 mg BID - Supplemental O2, wean as able - Echocardiogram done 12/12/22; LVEF 40-45% with Grade I Diastolic dysfunction  - Continue home GDMT - Strict I/O's and daily weight - [facility] Cardiology consulted, appreciate their consultation and recommendations  #COPD  Productive cough with clear sputum. No wheezing on exam. No infiltrates seen on CXR. No leukocytosis. - Continue home Breo-Ellipta - PRN Duoneb - Mucinex - Supplemental O2, wean as able - Incentive Spirometer, Flutter Valve  #Hypertension -Continue home Bisoprolol, Entresto, and Spironolactone  #Thrombocytopenia No signs of bleeding, bruising, or petechiae noted on exam - Follow up CBC with AM labs  #Hyperlipidemia - Continue home Atorvastatin  #GERD - Protonix  #Tobacco Use Disorder - Declined NRT stating it makes him sick - Educated on cessation  VTE prophylaxis: SCDs GI prophylaxis:  Protonix Diet: Heart Healthy Access: PIV Lines: NONE Code Status: FULL Telemetry: Yes Disposition: Admit to Progressive  Advance Care Planning: Code Status: FULL  Consults: Cardiology  Family Communication: Son at bedside  Severity of Illness: The appropriate patient status for this patient is INPATIENT. Inpatient status is judged to be reasonable and necessary in order to provide the required  intensity of service to ensure the patient's safety. The patient's presenting symptoms, physical exam findings, and initial radiographic and laboratory data in the context of their chronic comorbidities is felt to place them at high risk for further clinical deterioration. Furthermore, it is not anticipated that the patient will be medically stable for discharge from the hospital within 2 midnights of admission.   * I certify that at the point of admission it is my clinical judgment that the patient will require inpatient hospital care spanning beyond 2 midnights from the point of admission due to high intensity of service, high risk for further deterioration and high frequency of surveillance required.*  To reach the provider On-Call:   7AM- 7PM see care teams to locate the attending and reach out to them via [email or web address]. Password: [email or web address] 7PM-7AM contact night-coverage If you still have difficulty reaching the appropriate provider, please page the [facility] (Director on Call) for [facility] on amion for assistance  This document was prepared using Conservation officer, historic buildings and may include unintentional dictation errors.  Bishop Limbo FNP-BC, PMHNP-BC Nurse Practitioner [facility]

## 2023-01-16 NOTE — ED Notes (Signed)
Pt states that he wasn't able to sleep last pm because he didn't feel well with his breathing, states that he was just admitted a couple weeks ago with pneumonia, states that he always wears 4L via [place]. Pt appears ill and is labored to breathe in which he states is pretty good right now

## 2023-01-16 NOTE — ED Triage Notes (Signed)
Pt here with SOB x2 weeks. Pt wears oxygen at home, 4L BNC. Pt denies cp. Pt denies NV but had diarrhea yesterday.

## 2023-01-16 NOTE — ED Provider Notes (Signed)
[facility] Provider Note    Event Date/Time   First MD Initiated Contact with Patient 01/16/23 1005     (approximate)   History   Shortness of Breath   HPI  [name] is a 77 y.o. male history of CHF, COPD, CVA who presents with complaints of shortness of breath.  Patient is chronically on 4 L nasal cannula.  He reports worsening shortness of breath over the last several days.  Has not been taking Lasix because he thought that he had been taken off of it.  Review of records demonstrates hospitalization on 12/12/2022 for volume overload   Physical Exam   Triage Vital Signs: ED Triage Vitals  Encounter Vitals Group     BP 01/16/23 0931 (!) 158/62     Systolic BP Percentile --      Diastolic BP Percentile --      Pulse Rate 01/16/23 0931 68     Resp 01/16/23 0931 18     Temp 01/16/23 0931 98.7 F (37.1 C)     Temp Source 01/16/23 0931 Oral     SpO2 01/16/23 0931 94 %     Weight 01/16/23 0938 82.3 kg (181 lb 7 oz)     Height 01/16/23 0938 1.829 m (6')     Head Circumference --      Peak Flow --      Pain Score 01/16/23 0938 0     Pain Loc --      Pain Education --      Exclude from Growth Chart --     Most recent vital signs: Vitals:   01/16/23 0931 01/16/23 1030  BP: (!) 158/62 130/82  Pulse: 68 64  Resp: 18 20  Temp: 98.7 F (37.1 C)   SpO2: 94% 100%     General: Awake, no distress.  Chronically ill-appearing CV:  Good peripheral perfusion.  Resp:  Mild tachypnea, on 4 L nasal cannula, bibasilar Rales Abd:  No distention.  Other:  No significant lower extremity edema   ED Results / Procedures / Treatments   Labs (all labs ordered are listed, but only abnormal results are displayed) Labs Reviewed  CBC - Abnormal; Notable for the following components:      Result Value   RBC 3.91 (*)    Hemoglobin 12.4 (*)    MCV 101.5 (*)    Platelets 132 (*)    All other components within normal limits  COMPREHENSIVE METABOLIC PANEL  - Abnormal; Notable for the following components:   Glucose, Bld 126 (*)    Calcium 8.4 (*)    Albumin 3.2 (*)    All other components within normal limits  BRAIN NATRIURETIC PEPTIDE - Abnormal; Notable for the following components:   B Natriuretic Peptide 2,150.1 (*)    All other components within normal limits  SARS CORONAVIRUS 2 BY RT PCR     EKG  ED ECG REPORT I, Jene Every, the attending physician, personally viewed and interpreted this ECG.  Date: 01/16/2023  Rhythm: normal sinus rhythm QRS Axis: normal Intervals: normal ST/T Wave abnormalities: normal Narrative Interpretation: no evidence of acute ischemia    RADIOLOGY Chest x-ray viewed interpret by me, concerning for pulmonary edema    PROCEDURES:  Critical Care performed:   Procedures   MEDICATIONS ORDERED IN ED: Medications  furosemide (LASIX) injection 80 mg (80 mg Intravenous Given 01/16/23 1051)  ipratropium-albuterol (DUONEB) 0.5-2.5 (3) MG/3ML nebulizer solution 3 mL (3 mLs Nebulization Given 01/16/23 1100)  ipratropium-albuterol (DUONEB) 0.5-2.5 (3) MG/3ML nebulizer solution 3 mL (3 mLs Nebulization Given 01/16/23 1059)     IMPRESSION / MDM / ASSESSMENT AND PLAN / ED COURSE  I reviewed the triage vital signs and the nursing notes. Patient's presentation is most consistent with severe exacerbation of chronic illness.  Patient presents with shortness of breath as detailed above.  Differential includes CHF exacerbation, COPD exacerbation, pneumonia  Lab work reviewed, BNP over 2000, chest x-ray consistent with CHF exacerbation, will give 80 mg IV Lasix.  Have given DuoNebs as well.  Will require admission for diuresis, further management.  Have consulted the hospitalist team        FINAL CLINICAL IMPRESSION(S) / ED DIAGNOSES   Final diagnoses:  Chronic respiratory failure with hypoxia (HCC)  COPD exacerbation (HCC)     Rx / DC Orders   ED Discharge Orders     None         Note:  This document was prepared using Dragon voice recognition software and may include unintentional dictation errors.   Jene Every, MD 01/16/23 1155

## 2023-01-16 NOTE — Consult Note (Addendum)
Advanced Heart Failure Team Consult Note   Primary Physician: Danella Penton, MD PCP-Cardiologist:  Julien Nordmann, MD  Reason for Consultation: A/C HFmrEF exacerbation  HPI:    [name] is seen today for evaluation of A/C HFmrEF exacerbation at the request of Dr. Okey Dupre, cardiology.   [name] is a 77 y.o. male with a history of COPD stage II, anxiety, stroke, neuromuscular disorder,depression, HTN, prostate cancer s/p seed implantation, hyperlipidemia, lumbar disc disease, CAD with history of posterior MI in 2002 status post BMS to the LCx status post PCI/BMS to the LAD in 2012, AAA status post endovascular repair in 12/2017 followed by vascular surgery, intraparenchymal hemorrhage in 02/2021, pulmonary HTN, nephrolithiasis, GERD, tobacco use and chronic ischemic heart failure.   Myoview in 2010 negative for ischemia with an EF of 60%. In 2012, nuclear stress testing showed anterior wall ischemia with an estimated EF of 26%. Follow-up cardiac cath on 01/05/2011 showed 95% stenosis of the proximal LAD, 95% stenosis of the mid LAD, 30% in-stent restenosis of the mid left circumflex with a second lesion of diffuse 50% stenosis.  The patient underwent successful PCI/BMS to the mid LAD with 0% residual stenosis    Admitted 04/2021 with acute on chronic combined CHF and elevated troponin peaking at 448, felt to be related to demand ischemia.  Was improved with IV diuresis. Echo 04/2021 EF 30 to 35% with GHK, mild, GIDD, normal RV with mildly enlarged cavity size, PASP 54.5 mmHg, mildly dilated left atrium, severely dilated right atrium and estimated right atrial pressure of 8 mmHg.    Admitted 12/12/22 due to new onset of productive cough, shortness of breath and wheezing for 3 days prior to presentation. In the ED patient was hypoxic with pulse ox of 80% on room air. CXR showed pulmonary vascular congestion. Given IV lasix. Echo 8/24 LVEF 40 to 45% with GHK. Improvement in ejection fraction  compared to echo from 04/2021. Qualified for home oxygen.  Seen in AHF clinic 9/3, his BP in his left arm read with systolics in the 50s. BP on the right with systolic in the 80s. His furosemide was held and GDMT decreased.    AHF clinic 9/5 labs showed possible over diuresis and SBP was soft. Symptoms suspected to be more due his advanced COPD that HF, switched lasix to PRN.    Presented to the ED today with worsening SOB and BLE edema. Results reviewed: K 3.8, SCr 0.94, BNP 2150, CXR with COPD. Last night he was unable to sleep because he couldn't catch his breath. His son noticed that his weight had started to go up over the last week but didn't take extra lasix. Reports that he walks around his house without breathing difficulty.  Only drinks 2 bottles of mountain dew and a cup of coffee a day (previously drank more). Reports compliance with all meds. Smokes 1 pack QOD. Eats out for dinner every night.   Home Medications Prior to Admission medications   Medication Sig Start Date End Date Taking? Authorizing Provider  acetaminophen (TYLENOL) 325 MG tablet Take 2 tablets (650 mg total) by mouth every 6 (six) hours as needed for mild pain (or Fever >/= 101). 04/28/21  Yes Elgergawy, Leana Roe, MD  albuterol (PROVENTIL) (2.5 MG/3ML) 0.083% nebulizer solution Take 2.5 mg by nebulization every 6 (six) hours as needed. 10/09/22  Yes [provider]  albuterol (VENTOLIN HFA) 108 (90 Base) MCG/ACT inhaler Inhale 2 puffs into the lungs every 6 (  six) hours as needed for wheezing. 04/07/21 11/30/24 Yes Medina-Vargas, Monina C, NP  aspirin EC 81 MG tablet Take 81 mg by mouth daily.   Yes [provider]  atorvastatin (LIPITOR) 80 MG tablet Take 1 tablet (80 mg total) by mouth daily. 04/07/21  Yes Medina-Vargas, Monina C, NP  bisoprolol (ZEBETA) 5 MG tablet Take 0.5 tablets (2.5 mg total) by mouth daily. 12/19/22  Yes Hackney, Jarold Song, FNP  Multiple Vitamins-Minerals (MULTIVITAMIN WITH MINERALS)  tablet Take 1 tablet by mouth daily.   Yes [provider]  omeprazole (PRILOSEC) 40 MG capsule TAKE 1 CAPSULE BY MOUTH DAILY USUALLY BEFORE BREAKFAST 04/07/21  Yes Medina-Vargas, Monina C, NP  sacubitril-valsartan (ENTRESTO) 24-26 MG Take 1 tablet by mouth 2 (two) times daily. 12/21/22  Yes Romie Minus, MD  spironolactone (ALDACTONE) 25 MG tablet Take 0.5 tablets (12.5 mg total) by mouth daily. FOR CHRONIC COMBINED CHF 12/21/22  Yes Romie Minus, MD  budesonide-formoterol [facility]) 160-4.5 MCG/ACT inhaler Inhale 2 puffs into the lungs 2 (two) times daily. Patient not taking: Reported on 01/16/2023 04/07/21   Medina-Vargas, Monina C, NP  fluticasone furoate-vilanterol (BREO ELLIPTA) 200-25 MCG/ACT AEPB Inhale 1 puff into the lungs daily. Patient not taking: Reported on 01/16/2023 06/21/22   [provider]  furosemide (LASIX) 40 MG tablet Take 1 tablet (40 mg total) by mouth as needed. Take 1 tablet twice a day for 3 days then continue daily. Patient not taking: Reported on 01/16/2023 12/21/22   Romie Minus, MD  mirtazapine (REMERON) 7.5 MG tablet Take 7.5 mg by mouth at bedtime. 12/19/21 12/19/22  [provider]  nicotine (NICODERM CQ - DOSED IN MG/24 HOURS) 14 mg/24hr patch Place 1 patch (14 mg total) onto the skin daily. 12/15/22   Joycelyn Das, MD    Past Medical History: Past Medical History:  Diagnosis Date   AAA (abdominal aortic aneurysm) (HCC)    a.  CTA 7/19: measured 4.5 x 5.3 cm and greatest transverse dimensions   Abnormal nuclear stress test    a.  Myoview 2012: anterior wall ischemia with an estimated EF of 26%.  This was a new wall motion abnormality as well as a newly reduced EF   Brain bleed (HCC)    COPD (chronic obstructive pulmonary disease) (HCC)    Coronary artery disease    a.  Posterior MI in 2002 status post BMS to the LCx; b. LHC 2012: 95% stenosis of the proximal LAD, 95% stenosis of the mid LAD, 30% in-stent  restenosis of the mid left circumflex with a second lesion of diffuse 50% stenosis.  The patient underwent successful PCI/BMS to the mid LAD with 0% residual stenosis, LV gram not performed   GERD (gastroesophageal reflux disease)    History of kidney stones    History of prostate cancer    Hyperlipidemia    Hypertension    Myocardial infarction [facility])    Neuromuscular disorder (HCC)    Prostate cancer (HCC)    a.  Status post seeding   PVD (peripheral vascular disease) (HCC)    Stroke [facility])     Past Surgical History: Past Surgical History:  Procedure Laterality Date   BACK SURGERY  2009/2010   x 2   BOWEL RESECTION N/A 03/18/2021   Procedure: SMALL BOWEL RESECTION;  Surgeon: Diamantina Monks, MD;  Location: [facility] OR;  Service: General;  Laterality: N/A;   CARDIAC CATHETERIZATION  2002   stent placement [place]   ENDOVASCULAR REPAIR/STENT  GRAFT N/A 01/02/2018   Procedure: ENDOVASCULAR REPAIR/STENT GRAFT;  Surgeon: Annice Needy, MD;  Location: [facility] INVASIVE CV LAB;  Service: Cardiovascular;  Laterality: N/A;   FOOT SURGERY     bilateral    HERNIA REPAIR     bilateral    INGUINAL HERNIA REPAIR Left 03/18/2021   Procedure: INGUINAL HERNIA REPAIR;  Surgeon: Diamantina Monks, MD;  Location: [facility] OR;  Service: General;  Laterality: Left;   INSERTION OF MESH Left 03/18/2021   Procedure: INSERTION OF MESH;  Surgeon: Diamantina Monks, MD;  Location: [facility] OR;  Service: General;  Laterality: Left;   KNEE SURGERY     right knee    PROSTATE SURGERY  09/2009   prostate implant   SPINE SURGERY     UMBILICAL HERNIA REPAIR N/A 03/18/2021   Procedure: UMBILICAL HERNIA REPAIR;  Surgeon: Diamantina Monks, MD;  Location: [facility] OR;  Service: General;  Laterality: N/A;    Family History: Family History  Problem Relation Age of Onset   Heart attack Mother    Diabetes Mother    Heart disease Father    Lung cancer Daughter     Social History: Social History   Socioeconomic History   Marital status:  Married    Spouse name: Not on file   Number of children: Not on file   Years of education: Not on file   Highest education level: Not on file  Occupational History   Not on file  Tobacco Use   Smoking status: Every Day    Current packs/day: 0.25    Average packs/day: 0.3 packs/day for 56.0 years (14.0 ttl pk-yrs)    Types: Cigarettes   Smokeless tobacco: Never   Tobacco comments:    back to smoking X1 year  Vaping Use   Vaping status: Never Used  Substance and Sexual Activity   Alcohol use: No    Alcohol/week: 0.0 standard drinks of alcohol   Drug use: No   Sexual activity: Not Currently  Other Topics Concern   Not on file  Social History Narrative   Not on file   Social Determinants of Health   Financial Resource Strain: Low Risk  (01/03/2023)   [facility]   Overall Financial Resource Strain (CARDIA)    Difficulty of Paying Living Expenses: Not hard at all  Food Insecurity: No Food Insecurity (01/03/2023)   [facility] from [facility]   Hunger Vital Sign    Worried About Running Out of Food in the Last Year: Never true    Ran Out of Food in the Last Year: Never true  Transportation Needs: No Transportation Needs (01/03/2023)   Received from [facility] - Transportation    In the past 12 months, has lack of transportation kept you from medical appointments or from getting medications?: No    Lack of Transportation (Non-Medical): No  Physical Activity: Not on file  Stress: No Stress Concern Present (08/21/2018)   [employer] of Occupational [employer] - Occupational Stress Questionnaire    Feeling of Stress : Not at all  Social Connections: Not on file    Allergies:  Allergies  Allergen Reactions   Lyrica [Pregabalin] Swelling         Objective:    Vital Signs:   Temp:  [98.7 F (37.1 C)] 98.7 F (37.1 C) (10/01 0931) Pulse Rate:  [64-68] 64 (10/01 1030) Resp:  [18-20] 20 (10/01  1030) BP: (130-158)/(62-82) 130/82 (  10/01 1030) SpO2:  [94 %-100 %] 100 % (10/01 1030) Weight:  [82.3 kg] 82.3 kg (10/01 0938)    Weight change: Filed Weights   01/16/23 0938  Weight: 82.3 kg    Intake/Output:  No intake or output data in the 24 hours ending 01/16/23 1402    Physical Exam  General:  elderly appearing.   HEENT: +2L [place] Neck: supple. JVD difficult to see, appears elevated. Carotids 2+ bilat; no bruits. No lymphadenopathy or thyromegaly appreciated. Cor: PMI nondisplaced. Regular rate & rhythm. No rubs, gallops or murmurs. Lungs: coarse Abdomen: soft, nontender, nondistended. No hepatosplenomegaly. No bruits or masses. Good bowel sounds. Extremities: no cyanosis, clubbing, rash, +1 BLE edema  Neuro: alert & oriented x 3, cranial nerves grossly intact. moves all 4 extremities w/o difficulty. Affect pleasant.   Telemetry   NSR 60s with PACs and PVCs (Personally reviewed)    EKG    NSR PVCs 60s  Labs   Basic Metabolic Panel: Recent Labs  Lab 01/16/23 0940  NA 143  K 3.8  CL 105  CO2 32  GLUCOSE 126*  BUN 16  CREATININE 0.94  CALCIUM 8.4*    Liver Function Tests: Recent Labs  Lab 01/16/23 0940  AST 22  ALT 26  ALKPHOS 84  BILITOT 1.0  PROT 6.7  ALBUMIN 3.2*   No results for input(s): "LIPASE", "AMYLASE" in the last 168 hours. No results for input(s): "AMMONIA" in the last 168 hours.  CBC: Recent Labs  Lab 01/16/23 0940  WBC 7.4  HGB 12.4*  HCT 39.7  MCV 101.5*  PLT 132*    Cardiac Enzymes: No results for input(s): "CKTOTAL", "CKMB", "CKMBINDEX", "TROPONINI" in the last 168 hours.  BNP: BNP (last 3 results) Recent Labs    12/12/22 0616 12/19/22 1039 01/16/23 0940  BNP 926.2* 179.3* 2,150.1*    ProBNP (last 3 results) No results for input(s): "PROBNP" in the last 8760 hours.   CBG: No results for input(s): "GLUCAP" in the last 168 hours.  Coagulation Studies: No results for input(s): "LABPROT", "INR" in the last 72  hours.   Imaging   DG Chest 2 View  Result Date: 01/16/2023 CLINICAL DATA:  sob EXAM: CHEST - 2 VIEW COMPARISON:  12/14/2022. FINDINGS: Cardiac silhouette enlarged. No evidence of pneumothorax or pleural effusion. No evidence of pulmonary edema. Lungs are hyperinflated consistent with COPD. Aorta is calcified and tortuous. Osseous structures are osteopenic. IMPRESSION: Enlarged cardiac silhouette. Hyperinflated lungs consistent with COPD. Electronically Signed   By: Layla Maw M.D.   On: 01/16/2023 10:38     Medications:     Current Medications:  atorvastatin  80 mg Oral Daily   bisoprolol  2.5 mg Oral Daily   [START ON 01/17/2023] fluticasone furoate-vilanterol  1 puff Inhalation Daily   furosemide  40 mg Intravenous Q12H   guaiFENesin  600 mg Oral BID   multivitamin with minerals   Oral Daily   pantoprazole  40 mg Oral Daily   sacubitril-valsartan  1 tablet Oral BID   [START ON 01/17/2023] spironolactone  12.5 mg Oral Daily    Infusions:    Patient Profile   [name] is a 77 y.o. male with a history of COPD stage II, anxiety, stroke, neuromuscular disorder,depression, HTN, prostate cancer s/p seed implantation, hyperlipidemia, lumbar disc disease, CAD with history of posterior MI in 2002 status post BMS to the LCx status post PCI/BMS to the LAD in 2012, AAA status post endovascular repair in 12/2017 followed by vascular  surgery, intraparenchymal hemorrhage in 02/2021, pulmonary HTN, nephrolithiasis, GERD, tobacco use and chronic ischemic heart failure. Admitted with A/C HFmrEF.   Assessment/Plan  1: Ischemic heart failure with mildly reduced ejection fraction - Echo 12/12/22: EF 40-45% with moderate LVH, Grade I DD, normal PA Pressure with mild MR - NYHA IV on admission, volume overloaded on exam - suspect exacerbation 2/2 not taking his diuretics as he was instructed and dietary indiscretion.  - Continue lasix 40 mg IV BID - Previously he was taking 20 mg daily but  at AHF f/u he was suspected to be over diuresed and diuretics changed to PRN. He then failed to take his PRN diuretics despite noticing the weight gain. Discussed indications today with son and patient. Will need diuretics scheduled at discharge, probably QOD dosing. - Continue spironolactone 12.5mg  daily - Continue bisoprolol 2.5mg  daily and Entresto 24/26 twice daily - Start Farxiga 10 mg daily - Would titrate CHF meds slowly as he previously didn't tolerated higher doses 2/2 significant hypotension - Strict I&O, daily weights - Place TED hose   2: HTN -  May have some element of subclavian stenosis but given lack of symptoms and advanced COPD plan to monitor.  -SBP elevated, has not had meds yet   3: CAD - saw cardiology ([place]) 09/23 - continue ASA 81mg  daily - continue atorvastatin 80mg  daily. LDL last 67 9/24   4: COPD - wears 2-4L O2 at baseline - Needs incentive spirometer - continues to use inhalers - continues to smoke but takes his oxygen off and doesn't smoke anywhere near the oxygen  5. Frequent PVCs/PACs - Noted on tele - consider Zio - Keep K>4 and Mg >2  6. Tobacco abuse - cessation advised - smoking 1 pack QOD   Length of Stay: 0  Alen Bleacher, NP  01/16/2023, 2:02 PM  Advanced Heart Failure Team Pager [phone] (M-F; 7a - 5p)  Please contact [facility] for night-coverage after hours (4p -7a ) and weekends on [email or web address]   Patient seen with NP, agree with the above note.   History as outlined above.  Patient has not been taking Lasix since last cardiology office visit when he was told to use it prn. Since that time, he has become more short of breath and orthopneic.  Came to the ER today, BNP 2150.  He is using his home oxygen 4 L [place], still smoking also. Feels better after initial IV Lasix.   General: using accessory muscles to breathe Neck: JVP 10 cm, no thyromegaly or thyroid nodule.  Lungs: Rhonchi bilaterally.  CV: Nondisplaced PMI.  Heart regular  S1/S2, no S3/S4, no murmur.  Trace ankle edema.  No carotid bruit.  Difficult to palpate pedal pulses.  Abdomen: Soft, nontender, no hepatosplenomegaly, no distention.  Skin: Intact without lesions or rashes.  Neurologic: Alert and oriented x 3.  Psych: Normal affect. Extremities: No clubbing or cyanosis.  HEENT: Normal.   1. Acute on chronic systolic CHF: Suspect ischemic cardiomyopathy.  Last echo in 8/24 with EF 40-45%, moderate LVH, normal RV.  He is volume overloaded and short of breath with elevated BNP today after not taking Lasix for about a month.  Careful with med titration given histroy of hypotension.  - Lasix 40 mg IV bid.  Will need standing Lasix at home. Follow I/Os and weight.  - Start Farxiga 10 mg daily.  - Continue Entresto 24/26 bid.  - Continue spironolactone 12.5 daily.  - Can continue bisoprolol 2.5 daily (beta-1 selective  given severe COPD).  2. CAD: He had BMS to the LCx in 2002 and BMS to LAD in 2012.  No chest pain, doubt ACS.  - Continue ASA 81 - Continue statin.  3. COPD: Severe, still smoking.  On 4L home oxygen.  - Continue inhalers.  - Counseled to quit smoking.  4. Thrombocytopenia: Mild, chronic.   Marca Ancona 01/16/2023 4:20 PM

## 2023-01-17 ENCOUNTER — Encounter: Payer: Self-pay | Admitting: Internal Medicine

## 2023-01-17 DIAGNOSIS — D696 Thrombocytopenia, unspecified: Secondary | ICD-10-CM

## 2023-01-17 DIAGNOSIS — Z720 Tobacco use: Secondary | ICD-10-CM

## 2023-01-17 DIAGNOSIS — E785 Hyperlipidemia, unspecified: Secondary | ICD-10-CM

## 2023-01-17 DIAGNOSIS — J9621 Acute and chronic respiratory failure with hypoxia: Secondary | ICD-10-CM | POA: Diagnosis not present

## 2023-01-17 DIAGNOSIS — I5043 Acute on chronic combined systolic (congestive) and diastolic (congestive) heart failure: Secondary | ICD-10-CM | POA: Diagnosis not present

## 2023-01-17 DIAGNOSIS — J431 Panlobular emphysema: Secondary | ICD-10-CM | POA: Diagnosis not present

## 2023-01-17 DIAGNOSIS — I10 Essential (primary) hypertension: Secondary | ICD-10-CM | POA: Diagnosis not present

## 2023-01-17 LAB — CBC
HCT: 39.9 % (ref 39.0–52.0)
Hemoglobin: 12.8 g/dL — ABNORMAL LOW (ref 13.0–17.0)
MCH: 31.8 pg (ref 26.0–34.0)
MCHC: 32.1 g/dL (ref 30.0–36.0)
MCV: 99 fL (ref 80.0–100.0)
Platelets: 141 10*3/uL — ABNORMAL LOW (ref 150–400)
RBC: 4.03 MIL/uL — ABNORMAL LOW (ref 4.22–5.81)
RDW: 12.9 % (ref 11.5–15.5)
WBC: 8.1 10*3/uL (ref 4.0–10.5)
nRBC: 0 % (ref 0.0–0.2)

## 2023-01-17 LAB — BASIC METABOLIC PANEL
Anion gap: 6 (ref 5–15)
BUN: 19 mg/dL (ref 8–23)
CO2: 38 mmol/L — ABNORMAL HIGH (ref 22–32)
Calcium: 8.5 mg/dL — ABNORMAL LOW (ref 8.9–10.3)
Chloride: 98 mmol/L (ref 98–111)
Creatinine, Ser: 1.14 mg/dL (ref 0.61–1.24)
GFR, Estimated: >60 mL/min (ref >60)
Glucose, Bld: 126 mg/dL — ABNORMAL HIGH (ref 70–99)
Potassium: 3.9 mmol/L (ref 3.5–5.1)
Sodium: 142 mmol/L (ref 135–145)

## 2023-01-17 MED ORDER — FUROSEMIDE 20 MG PO TABS
20.0000 mg | ORAL_TABLET | Freq: Every day | ORAL | Status: DC
  Administered 2023-01-18: 20 mg via ORAL
  Filled 2023-01-17: qty 1

## 2023-01-17 MED ORDER — MUSCLE RUB 10-15 % EX CREA
1.0000 | TOPICAL_CREAM | CUTANEOUS | Status: DC | PRN
  Filled 2023-01-17: qty 85

## 2023-01-17 MED ORDER — SPIRONOLACTONE 25 MG PO TABS
25.0000 mg | ORAL_TABLET | Freq: Every day | ORAL | Status: DC
  Administered 2023-01-18: 25 mg via ORAL
  Filled 2023-01-17: qty 1

## 2023-01-17 MED ORDER — FUROSEMIDE 10 MG/ML IJ SOLN
40.0000 mg | Freq: Once | INTRAMUSCULAR | Status: AC
  Administered 2023-01-17: 40 mg via INTRAVENOUS
  Filled 2023-01-17: qty 4

## 2023-01-17 MED ORDER — ASPIRIN 81 MG PO TBEC
81.0000 mg | DELAYED_RELEASE_TABLET | Freq: Every day | ORAL | Status: DC
  Administered 2023-01-17 – 2023-01-18 (×2): 81 mg via ORAL
  Filled 2023-01-17 (×2): qty 1

## 2023-01-17 MED ORDER — ORAL CARE MOUTH RINSE: 15.0000 mL | OROMUCOSAL | Status: DC | PRN

## 2023-01-17 MED ORDER — SPIRONOLACTONE 12.5 MG HALF TABLET
12.5000 mg | ORAL_TABLET | Freq: Once | ORAL | Status: AC
  Administered 2023-01-17: 12.5 mg via ORAL
  Filled 2023-01-17: qty 1

## 2023-01-17 MED ORDER — SPIRONOLACTONE 25 MG PO TABS: 25.0000 mg | ORAL_TABLET | Freq: Every day | ORAL | Status: DC

## 2023-01-17 NOTE — Hospital Course (Addendum)
Taken from H&P.  [name] is a 77 y.o. male with medical history significant of COPD GOLD stage II, chronic hypoxic and hypercapnic respiratory failure on 4 L O2 via nasal cannula at home, anxiety/depression, CAD, MI s/p stenting, chronic HFrEF LVEF 40-45% 11/2022, stroke, neuromuscular disorder,depression, HTN, prostate cancer s/p seed implantation, AAA s/p endovascular repair in 12/2017 , intraparenchymal hemorrhage in 02/2021, pulmonary HTN, nephrolithiasis, GERD, and tobacco use.  He presents to [facility] ED today after experiencing worsening shortness of breath for the last several days.  Son reports last night breathing worsened and patient did not sleep at all which is abnormal for him.  Patient reports he has not been taking his Lasix because he thought it was stopped, review of records shows that Lasix was changed to PRN during recent advanced heart failure visit on 12/21/2022. Son reports ~ 6 lb weight gain over the last week when weighed at home during home health nursing visits.    On arrival to ED patient was stable vitals, saturating 96% on 6 L of oxygen. Chest x-ray was negative for any acute abnormality or pulmonary edema.  BNP significantly elevated at 2150, platelet 132.  COVID-19 PCR negative.  Patient was given 80 mg of IV Lasix and 2 DuoNeb nebulizers. Heart failure team was also consulted. Patient was continued on IV Lasix 40 mg twice daily.  10/2: Vitals and labs stable with saturating at 97% on 6 L, cardiology is recommending 1 more day of IV Lasix and then start on p.o. at 20 mg daily from tomorrow for discharge.  Cardiac regimen for discharge is Farxiga 10 daily, Lasix 20 daily, Entresto 24/26 bid, spironolactone 25 daily, bisoprolol 2.5 daily, atorvastatin 80 daily, ASA 81 daily.   10/3: Remains stable, now weaned back to baseline oxygen requirement of 4 L.  Patient is being transitioned to p.o. Lasix as advised by heart failure team.  [name] was also added. PT was  recommended home health which was ordered.  Patient will continue on current medications and need to have a close follow-up with his providers for further recommendations.

## 2023-01-17 NOTE — Assessment & Plan Note (Signed)
Continue statin. 

## 2023-01-17 NOTE — Assessment & Plan Note (Signed)
No acute exacerbation. - Continue home Breo-Ellipta - PRN Duoneb - Mucinex - Supplemental O2, wean as able - Incentive Spirometer, Flutter Valve

## 2023-01-17 NOTE — Assessment & Plan Note (Signed)
Clinically appears euvolemic today, significantly elevated BNP at 2150.  Patient was not taking his home Lasix which was made recently as needed.  Echocardiogram done 12/12/22; LVEF 40-45% with Grade I Diastolic dysfunction . Advanced heart failure team is on board. -Continue with IV Lasix today-patient will be transition to 20 mg p.o. daily from tomorrow. -Continue home GDMT -Daily weight and BMP -Strict intake and output

## 2023-01-17 NOTE — TOC Initial Note (Signed)
Transition of Care [facility]) - Initial/Assessment Note    Patient Details  Name: [name] MRN: [id number] Date of Birth: 04-15-1946  Transition of Care [facility]) CM/SW Contact:    Marquita Palms, LCSW Phone Number: 01/17/2023, 4:30 PM  Clinical Narrative:                  CSW met with patient bedside. Patient reported he lives alone but his sons check on him daily to make sure he eats. Patient reports that he does use [facility] for Home Health. He reports using O2 at home. Pt reports he uses [facility] in [place] for his pharmacy. PT recommends Home Health once discharged. [facility] notified.     Patient Goals and CMS Choice            Expected Discharge Plan and Services                                              Prior Living Arrangements/Services                       Activities of Daily Living   ADL Screening (condition at time of admission) Independently performs ADLs?: Yes (appropriate for developmental age) Is the patient deaf or have difficulty hearing?: No Does the patient have difficulty seeing, even when wearing glasses/contacts?: No Does the patient have difficulty concentrating, remembering, or making decisions?: No  Permission Sought/Granted                  Emotional Assessment              Admission diagnosis:  Acute on chronic combined systolic and diastolic CHF (congestive heart failure) (HCC) [I50.43] Patient Active Problem List   Diagnosis Date Noted   Thrombocytopenia (HCC) 01/17/2023   Acute on chronic hypoxic respiratory failure (HCC) 12/13/2022   COPD (chronic obstructive pulmonary disease) (HCC) 12/12/2022   Acute hypoxemic respiratory failure (HCC) 12/12/2022   Acute on chronic combined systolic and diastolic CHF (congestive heart failure) (HCC) 04/24/2021   Anemia 04/24/2021   Acute on chronic respiratory failure with hypoxia (HCC) 04/24/2021   Incarcerated inguinal hernia 03/18/2021    Chronic combined systolic and diastolic congestive heart failure (HCC)    Intraparenchymal hemorrhage of brain (HCC) 02/26/2021   SDH (subdural hematoma) (HCC) 02/21/2021   Stroke (HCC) 02/21/2021   Elevated troponin 02/21/2021   Other spondylosis with radiculopathy, lumbar region 09/04/2018   Centrilobular emphysema (HCC) 07/19/2018   Dilated cardiomyopathy (HCC) 07/19/2018   Coronary artery disease of native artery of native heart with stable angina pectoris (HCC) 01/20/2018   AAA (abdominal aortic aneurysm) without rupture (HCC) 10/30/2017   Acute exacerbation of chronic obstructive pulmonary disease (COPD) (HCC) 04/03/2015   Barrett's esophagus 05/20/2014   Constipation 05/20/2014   Arthritis of hand 05/07/2014   Pulmonary nodule 11/06/2011   Tobacco abuse 02/12/2011   CAD, NATIVE VESSEL/HLD 10/18/2008   COLONIC POLYPS 08/05/2008   Hyperlipidemia 08/05/2008   Essential hypertension 08/05/2008   GERD 08/05/2008   PCP:  Danella Penton, MD Pharmacy:   [facility] - [place], [place] - [address] [place] [address] Phone: [phone] Fax: [phone]  [facility] [place], [place] - [address] [place] [address] Phone: [phone] Fax: [phone]  Social Determinants of Health (SDOH) Social History: SDOH Screenings   Food Insecurity: No Food Insecurity (01/17/2023)  Housing: Low Risk  (01/17/2023)  Transportation Needs: No Transportation Needs (01/17/2023)  Utilities: Not At Risk (01/17/2023)  Depression (PHQ2-9): Low Risk  (09/03/2018)  Financial Resource Strain: Low Risk  (01/03/2023)   Received from [facility]  Stress: No Stress Concern Present (08/21/2018)  Tobacco Use: High Risk (01/17/2023)   SDOH Interventions:     Readmission Risk Interventions    04/28/2021    9:32 AM  Readmission Risk Prevention Plan  Transportation Screening Complete  PCP  or Specialist Appt within 3-5 Days Complete  HRI or Home Care Consult Complete  Social Work Consult for Recovery Care Planning/Counseling Complete  Palliative Care Screening Not Applicable  Medication Review Oceanographer) Complete

## 2023-01-17 NOTE — Assessment & Plan Note (Signed)
-  Continue home bisoprolol, Entresto and spironolactone -Low-dose Lasix will be added on discharge

## 2023-01-17 NOTE — Assessment & Plan Note (Signed)
Initially required up to 6 L of oxygen, able to wean back to baseline use of 4 L. -Continue supplemental oxygen to keep the saturation above 90%

## 2023-01-17 NOTE — Evaluation (Signed)
Physical Therapy Evaluation Patient Details Name: LANCER PERCLE MRN: [id number] DOB: [birth year] Today's Date: 01/17/2023  History of Present Illness  Pt is a 77 yo male that presented for SOB, workup for evaluation of heart failure exacerbation. PMH of COPD stage II, anxiety, stroke, neuromuscular disorder,depression, HTN, prostate cancer s/p seed implantation, hyperlipidemia, lumbar disc disease, CAD with history of posterior MI in 2002 status post BMS to the LCx status post PCI/BMS to the LAD in 2012, AAA status post endovascular repair in 12/2017 followed by vascular surgery, intraparenchymal hemorrhage in 02/2021, pulmonary HTN, nephrolithiasis, GERD, tobacco use and chronic ischemic heart failure.    Clinical Impression  Pt alert, agreeable to PT noted to be fully soaked (purewick apparently malfunctioned). Session focused addressing this as well as functional mobility. Bed mobility modI with bed rails. He was able to sit <> stand with RW several times, needed extended time and BUE to complete, supervision.  He ambulated ~52ft in total in room, supervision. Per pt at baseline he is modI with his RW and his son in laws check in at night and assist with meal prep.  Overall the patient demonstrated deficits (see "PT Problem List") that impede the patient's functional abilities, safety, and mobility and would benefit from skilled PT intervention.       If plan is discharge home, recommend the following: Assistance with cooking/housework;Assist for transportation;Direct supervision/assist for medications management;Help with stairs or ramp for entrance   Can travel by private vehicle        Equipment Recommendations None recommended by PT  Recommendations for Other Services       Functional Status Assessment Patient has had a recent decline in their functional status and demonstrates the ability to make significant improvements in function in a reasonable and predictable amount of time.      Precautions / Restrictions Precautions Precautions: Fall Restrictions Weight Bearing Restrictions: No      Mobility  Bed Mobility Overal bed mobility: Modified Independent                  Transfers Overall transfer level: Needs assistance Equipment used: Rolling walker (2 wheels) Transfers: Sit to/from Stand Sit to Stand: Supervision           General transfer comment: 3rd sit to stand pt needed several attempts and BUE to complete    Ambulation/Gait Ambulation/Gait assistance: Supervision Gait Distance (Feet):  (8ft total) Assistive device: Rolling walker (2 wheels)         General Gait Details: limited to in room mobility due to incontinence  Stairs            Wheelchair Mobility     Tilt Bed    Modified Rankin (Stroke Patients Only)       Balance Overall balance assessment: Needs assistance Sitting-balance support: Feet supported Sitting balance-Leahy Scale: Good     Standing balance support: Bilateral upper extremity supported, During functional activity Standing balance-Leahy Scale: Fair                               Pertinent Vitals/Pain Pain Assessment Pain Assessment: No/denies pain    Home Living Family/patient expects to be discharged to:: Private residence Living Arrangements: Alone Available Help at Discharge: Family;Available PRN/intermittently Type of Home: Mobile home Home Access: Stairs to enter;Ramped entrance Entrance Stairs-Rails: Can reach both Entrance Stairs-Number of Steps: 4   Home Layout: One level Home Equipment: Agricultural consultant (2 wheels);Cane -  single point Additional Comments: step sons bring food and drive to appointments if needed, checks on him nightly    Prior Function Prior Level of Function : Independent/Modified Independent             Mobility Comments: using rolling walker, no falls reported       Extremity/Trunk Assessment   Upper Extremity Assessment Upper  Extremity Assessment: Generalized weakness    Lower Extremity Assessment Lower Extremity Assessment: Generalized weakness       Communication      Cognition Arousal: Alert Behavior During Therapy: WFL for tasks assessed/performed Overall Cognitive Status: Within Functional Limits for tasks assessed                                          General Comments      Exercises     Assessment/Plan    PT Assessment Patient needs continued PT services  PT Problem List Decreased strength;Decreased activity tolerance;Decreased balance;Decreased mobility       PT Treatment Interventions DME instruction;Neuromuscular re-education;Gait training;Stair training;Patient/family education;Functional mobility training;Therapeutic activities;Therapeutic exercise;Balance training    PT Goals (Current goals can be found in the Care Plan section)  Acute Rehab PT Goals Patient Stated Goal: to go home PT Goal Formulation: With patient Time For Goal Achievement: 01/31/23 Potential to Achieve Goals: Good    Frequency Min 1X/week     Co-evaluation               AM-PAC PT "6 Clicks" Mobility  Outcome Measure Help needed turning from your back to your side while in a flat bed without using bedrails?: None Help needed moving from lying on your back to sitting on the side of a flat bed without using bedrails?: None Help needed moving to and from a bed to a chair (including a wheelchair)?: None Help needed standing up from a chair using your arms (e.g., wheelchair or bedside chair)?: None Help needed to walk in hospital room?: None Help needed climbing 3-5 steps with a railing? : A Little 6 Click Score: 23    End of Session   Activity Tolerance: Patient tolerated treatment well Patient left: in bed;with call bell/phone within reach;with bed alarm set Nurse Communication: Mobility status PT Visit Diagnosis: Other abnormalities of gait and mobility (R26.89);Muscle  weakness (generalized) (M62.81);Difficulty in walking, not elsewhere classified (R26.2)    Time: 1324-4010 PT Time Calculation (min) (ACUTE ONLY): 26 min   Charges:   PT Evaluation $PT Eval Low Complexity: 1 Low PT Treatments $Therapeutic Activity: 23-37 mins PT General Charges $$ ACUTE PT VISIT: 1 Visit        Olga Coaster PT, DPT 2:51 PM,01/17/23

## 2023-01-17 NOTE — Assessment & Plan Note (Signed)
-   Continue home aspirin and statin 

## 2023-01-17 NOTE — Assessment & Plan Note (Signed)
-

## 2023-01-17 NOTE — Assessment & Plan Note (Signed)
Patient does not want nicotine patch. -Counseling was provided

## 2023-01-17 NOTE — Progress Notes (Signed)
Patient ID: [name], male   DOB: Oct 28, 1945, 77 y.o.   MRN: [id number]     Advanced Heart Failure Rounding Note  PCP-Cardiologist: Julien Nordmann, MD   Subjective:    Patient reports that his breathing is better.  He is still in the ER and I/Os are not recorded though his Purewick bottle is full. Creatinine stable.    Objective:   Weight Range: 82.3 kg Body mass index is 24.61 kg/m.   Vital Signs:   Temp:  [98 F (36.7 C)-98.6 F (37 C)] 98 F (36.7 C) (10/02 0601) Pulse Rate:  [50-98] 58 (10/02 0856) Resp:  [14-27] 15 (10/02 0856) BP: (106-164)/(42-106) 133/55 (10/02 0856) SpO2:  [94 %-100 %] 97 % (10/02 0856)    Weight change: Filed Weights   01/16/23 0938  Weight: 82.3 kg    Intake/Output:   Intake/Output Summary (Last 24 hours) at 01/17/2023 1023 Last data filed at 01/16/2023 1950 Gross per 24 hour  Intake --  Output 500 ml  Net -500 ml      Physical Exam    General:  Well appearing. No resp difficulty HEENT: Normal Neck: Supple. JVP 8 cm. Carotids 2+ bilat; no bruits. No lymphadenopathy or thyromegaly appreciated. Cor: PMI nondisplaced. Regular rate & rhythm. No rubs, gallops or murmurs. Lungs: Occasional rhonchi Abdomen: Soft, nontender, nondistended. No hepatosplenomegaly. No bruits or masses. Good bowel sounds. Extremities: No cyanosis, clubbing, rash, edema Neuro: Alert & orientedx3, cranial nerves grossly intact. moves all 4 extremities w/o difficulty. Affect pleasant   Telemetry   NSR, personally reviewed  Labs    CBC Recent Labs    01/16/23 0940 01/17/23 0331  WBC 7.4 8.1  HGB 12.4* 12.8*  HCT 39.7 39.9  MCV 101.5* 99.0  PLT 132* 141*   Basic Metabolic Panel Recent Labs    96/29/52 0940 01/17/23 0331  NA 143 142  K 3.8 3.9  CL 105 98  CO2 32 38*  GLUCOSE 126* 126*  BUN 16 19  CREATININE 0.94 1.14  CALCIUM 8.4* 8.5*   Liver Function Tests Recent Labs    01/16/23 0940  AST 22  ALT 26  ALKPHOS 84  BILITOT 1.0   PROT 6.7  ALBUMIN 3.2*   No results for input(s): "LIPASE", "AMYLASE" in the last 72 hours. Cardiac Enzymes No results for input(s): "CKTOTAL", "CKMB", "CKMBINDEX", "TROPONINI" in the last 72 hours.  BNP: BNP (last 3 results) Recent Labs    12/12/22 0616 12/19/22 1039 01/16/23 0940  BNP 926.2* 179.3* 2,150.1*    ProBNP (last 3 results) No results for input(s): "PROBNP" in the last 8760 hours.   D-Dimer No results for input(s): "DDIMER" in the last 72 hours. Hemoglobin A1C No results for input(s): "HGBA1C" in the last 72 hours. Fasting Lipid Panel No results for input(s): "CHOL", "HDL", "LDLCALC", "TRIG", "CHOLHDL", "LDLDIRECT" in the last 72 hours. Thyroid Function Tests No results for input(s): "TSH", "T4TOTAL", "T3FREE", "THYROIDAB" in the last 72 hours.  Invalid input(s): "FREET3"  Other results:   Imaging    No results found.   Medications:     Scheduled Medications:  atorvastatin  80 mg Oral Daily   bisoprolol  2.5 mg Oral Daily   dapagliflozin propanediol  10 mg Oral Daily   fluticasone furoate-vilanterol  1 puff Inhalation Daily   [START ON 01/18/2023] furosemide  20 mg Oral Daily   guaiFENesin  600 mg Oral BID   multivitamin with minerals   Oral Daily   pantoprazole  40 mg Oral  Daily   sacubitril-valsartan  1 tablet Oral BID   spironolactone  12.5 mg Oral Daily    Infusions:   PRN Medications: acetaminophen **OR** acetaminophen, ipratropium-albuterol, melatonin, ondansetron **OR** ondansetron (ZOFRAN) IV, polyethylene glycol    Assessment/Plan   1. Acute on chronic systolic CHF: Suspect ischemic cardiomyopathy.  Last echo in 8/24 with EF 40-45%, moderate LVH, normal RV.  He was admitted with volume overloaded after not taking Lasix for about a month.  Careful with med titration given history of hypotension. Volume status looks better today.  - Will give 1 more dose of Lasix 40 mg IV this morning.  Tomorrow, he will start Lasix 20 mg daily  for home.  Will need standing Lasix at home.  - Continue Farxiga 10 mg daily.  - Continue Entresto 24/26 bid.  - Increase spironolactone to 25 mg daily.  - Can continue bisoprolol 2.5 daily (beta-1 selective given severe COPD).  2. CAD: He had BMS to the LCx in 2002 and BMS to LAD in 2012.  No chest pain, doubt ACS.  - Continue ASA 81 - Continue statin.  3. COPD: Severe, still smoking.  On 4L home oxygen.  - Continue inhalers.  - Counseled to quit smoking.  4. Thrombocytopenia: Mild, chronic.   If he can walk around without trouble today, I think he could go home.  Cardiac regimen for home: Farxiga 10 daily, Lasix 20 daily, Entresto 24/26 bid, spironolactone 25 daily, bisoprolol 2.5 daily, atorvastatin 80 daily, ASA 81 daily.   Length of Stay: 1  Marca Ancona, MD  01/17/2023, 10:23 AM  Advanced Heart Failure Team Pager [phone] (M-F; 7a - 5p)  Please contact [facility] for night-coverage after hours (5p -7a ) and weekends on [email or web address]

## 2023-01-17 NOTE — Progress Notes (Signed)
Progress Note   Patient: [name]:[id number] DOB: 08-Apr-1946 DOA: 01/16/2023     1 DOS: the patient was seen and examined on 01/17/2023   Brief hospital course: Taken from H&P.  [name] is a 77 y.o. male with medical history significant of COPD GOLD stage II, chronic hypoxic and hypercapnic respiratory failure on 4 L O2 via nasal cannula at home, anxiety/depression, CAD, MI s/p stenting, chronic HFrEF LVEF 40-45% 11/2022, stroke, neuromuscular disorder,depression, HTN, prostate cancer s/p seed implantation, AAA s/p endovascular repair in 12/2017 , intraparenchymal hemorrhage in 02/2021, pulmonary HTN, nephrolithiasis, GERD, and tobacco use.  He presents to [facility] ED today after experiencing worsening shortness of breath for the last several days.  Son reports last night breathing worsened and patient did not sleep at all which is abnormal for him.  Patient reports he has not been taking his Lasix because he thought it was stopped, review of records shows that Lasix was changed to PRN during recent advanced heart failure visit on 12/21/2022. Son reports ~ 6 lb weight gain over the last week when weighed at home during home health nursing visits.    On arrival to ED patient was stable vitals, saturating 96% on 6 L of oxygen. Chest x-ray was negative for any acute abnormality or pulmonary edema.  BNP significantly elevated at 2150, platelet 132.  COVID-19 PCR negative.  Patient was given 80 mg of IV Lasix and 2 DuoNeb nebulizers. Heart failure team was also consulted. Patient was continued on IV Lasix 40 mg twice daily.  10/2: Vitals and labs stable with saturating at 97% on 6 L, cardiology is recommending 1 more day of IV Lasix and then start on p.o. at 20 mg daily from tomorrow for discharge.  Cardiac regimen for discharge is Farxiga 10 daily, Lasix 20 daily, Entresto 24/26 bid, spironolactone 25 daily, bisoprolol 2.5 daily, atorvastatin 80 daily, ASA 81 daily.      Assessment and Plan: * Acute on chronic combined systolic and diastolic CHF (congestive heart failure) (HCC) Clinically appears euvolemic today, significantly elevated BNP at 2150.  Patient was not taking his home Lasix which was made recently as needed.  Echocardiogram done 12/12/22; LVEF 40-45% with Grade I Diastolic dysfunction . Advanced heart failure team is on board. -Continue with IV Lasix today-patient will be transition to 20 mg p.o. daily from tomorrow. -Continue home GDMT -Daily weight and BMP -Strict intake and output  Acute on chronic hypoxic respiratory failure (HCC) Initially required up to 6 L of oxygen, able to wean back to baseline use of 4 L. -Continue supplemental oxygen to keep the saturation above 90%  COPD (chronic obstructive pulmonary disease) (HCC) No acute exacerbation. - Continue home Breo-Ellipta - PRN Duoneb - Mucinex - Supplemental O2, wean as able - Incentive Spirometer, Flutter Valve  Essential hypertension -Continue home bisoprolol, Entresto and spironolactone -Low-dose Lasix will be added on discharge  Coronary artery disease of native artery of native heart with stable angina pectoris (HCC) -Continue home aspirin and statin  Thrombocytopenia (HCC) Seems chronic and stable. -Continue to monitor  Hyperlipidemia -Continue statin  GERD -Continue Protonix  Tobacco abuse Patient does not want nicotine patch. -Counseling was provided   Subjective: Patient with much improved shortness of breath when seen today.  Able to lay down now.  Physical Exam: Vitals:   01/17/23 0856 01/17/23 1000 01/17/23 1100 01/17/23 1156  BP: (!) 133/55 (!) 151/70  (!) 131/51  Pulse: (!) 58 61 (!) 51 (!) 52  Resp: 15  16 16   Temp:    98.4 F (36.9 C)  TempSrc:    Oral  SpO2: 97%  100% 100%  Weight:      Height:       General.  Frail elderly man, in no acute distress. Pulmonary.  Lungs clear bilaterally, normal respiratory effort. CV.  Regular  rate and rhythm, no JVD, rub or murmur. Abdomen.  Soft, nontender, nondistended, BS positive. CNS.  Alert and oriented .  No focal neurologic deficit. Extremities.  No edema, no cyanosis, pulses intact and symmetrical. Psychiatry.  Judgment and insight appears normal.   Data Reviewed: Prior data reviewed  Family Communication: Talked with son on phone.  Disposition: Status is: Inpatient Remains inpatient appropriate because: Severity of illness  Planned Discharge Destination: Home  Time spent: 45 minutes  This record has been created using Conservation officer, historic buildings. Errors have been sought and corrected,but may not always be located. Such creation errors do not reflect on the standard of care.   Author: Arnetha Courser, MD 01/17/2023 1:20 PM  For on call review [email or web address].

## 2023-01-17 NOTE — Assessment & Plan Note (Signed)
Seems chronic and stable. -Continue to monitor

## 2023-01-18 DIAGNOSIS — I25118 Atherosclerotic heart disease of native coronary artery with other forms of angina pectoris: Secondary | ICD-10-CM

## 2023-01-18 DIAGNOSIS — I5043 Acute on chronic combined systolic (congestive) and diastolic (congestive) heart failure: Secondary | ICD-10-CM | POA: Diagnosis not present

## 2023-01-18 DIAGNOSIS — J441 Chronic obstructive pulmonary disease with (acute) exacerbation: Secondary | ICD-10-CM | POA: Diagnosis not present

## 2023-01-18 DIAGNOSIS — J9611 Chronic respiratory failure with hypoxia: Secondary | ICD-10-CM | POA: Diagnosis not present

## 2023-01-18 DIAGNOSIS — I10 Essential (primary) hypertension: Secondary | ICD-10-CM | POA: Diagnosis not present

## 2023-01-18 DIAGNOSIS — I739 Peripheral vascular disease, unspecified: Secondary | ICD-10-CM | POA: Diagnosis not present

## 2023-01-18 DIAGNOSIS — J9601 Acute respiratory failure with hypoxia: Secondary | ICD-10-CM | POA: Diagnosis not present

## 2023-01-18 DIAGNOSIS — K219 Gastro-esophageal reflux disease without esophagitis: Secondary | ICD-10-CM

## 2023-01-18 DIAGNOSIS — I700 Atherosclerosis of aorta: Secondary | ICD-10-CM | POA: Diagnosis not present

## 2023-01-18 DIAGNOSIS — J9612 Chronic respiratory failure with hypercapnia: Secondary | ICD-10-CM | POA: Diagnosis not present

## 2023-01-18 DIAGNOSIS — I110 Hypertensive heart disease with heart failure: Secondary | ICD-10-CM | POA: Diagnosis not present

## 2023-01-18 LAB — BASIC METABOLIC PANEL
Anion gap: 8 (ref 5–15)
BUN: 30 mg/dL — ABNORMAL HIGH (ref 8–23)
CO2: 37 mmol/L — ABNORMAL HIGH (ref 22–32)
Calcium: 8.6 mg/dL — ABNORMAL LOW (ref 8.9–10.3)
Chloride: 96 mmol/L — ABNORMAL LOW (ref 98–111)
Creatinine, Ser: 1.34 mg/dL — ABNORMAL HIGH (ref 0.61–1.24)
GFR, Estimated: 54 mL/min — ABNORMAL LOW (ref >60)
Glucose, Bld: 92 mg/dL (ref 70–99)
Potassium: 4 mmol/L (ref 3.5–5.1)
Sodium: 141 mmol/L (ref 135–145)

## 2023-01-18 MED ORDER — FUROSEMIDE 20 MG PO TABS: 20.0000 mg | ORAL_TABLET | Freq: Every day | ORAL | 1 refills | Status: DC

## 2023-01-18 MED ORDER — DAPAGLIFLOZIN PROPANEDIOL 10 MG PO TABS: 10.0000 mg | ORAL_TABLET | Freq: Every day | ORAL | 2 refills | Status: DC

## 2023-01-18 NOTE — Progress Notes (Addendum)
Patient ID: [name], male   DOB: Sep 13, 1945, 77 y.o.   MRN: [id number]     Advanced Heart Failure Rounding Note  PCP-Cardiologist: Julien Nordmann, MD   Subjective:    Diuresed well with IV lasix this admission, -2.3L UOP yesterday. No weight this morning. Laying mostly flat today. Still unable to sleep per patient report. Denies CP/SOB.   Objective:   Weight Range: 82.3 kg Body mass index is 24.61 kg/m.   Vital Signs:   Temp:  [98 F (36.7 C)-98.4 F (36.9 C)] 98.2 F (36.8 C) (10/03 0403) Pulse Rate:  [47-96] 47 (10/03 0403) Resp:  [15-20] 18 (10/03 0403) BP: (114-151)/(51-75) 121/61 (10/03 0403) SpO2:  [87 %-100 %] 94 % (10/03 0403) Last BM Date : 01/15/23  Weight change: Filed Weights   01/16/23 0938  Weight: 82.3 kg    Intake/Output:   Intake/Output Summary (Last 24 hours) at 01/18/2023 0817 Last data filed at 01/18/2023 0435 Gross per 24 hour  Intake 240 ml  Output 2300 ml  Net -2060 ml    Physical Exam  General:  elderly, weak appearing.  No respiratory difficulty HEENT: normal. + [place] Neck: supple. JVD ~7 cm. Carotids 2+ bilat; no bruits. No lymphadenopathy or thyromegaly appreciated. Cor: PMI nondisplaced. Regular rate & rhythm. No rubs, gallops or murmurs. Lungs: clear, diminished bases Abdomen: soft, nontender, nondistended. No hepatosplenomegaly. No bruits or masses. Good bowel sounds. Extremities: no cyanosis, clubbing, rash, edema  Neuro: alert & oriented x 3, cranial nerves grossly intact. moves all 4 extremities w/o difficulty. Affect pleasant.   Telemetry   NSR/SB 50s-60s PVCs (Personally reviewed)    Labs    CBC Recent Labs    01/16/23 0940 01/17/23 0331  WBC 7.4 8.1  HGB 12.4* 12.8*  HCT 39.7 39.9  MCV 101.5* 99.0  PLT 132* 141*   Basic Metabolic Panel Recent Labs    95/62/13 0940 01/17/23 0331  NA 143 142  K 3.8 3.9  CL 105 98  CO2 32 38*  GLUCOSE 126* 126*  BUN 16 19  CREATININE 0.94 1.14  CALCIUM 8.4* 8.5*    Liver Function Tests Recent Labs    01/16/23 0940  AST 22  ALT 26  ALKPHOS 84  BILITOT 1.0  PROT 6.7  ALBUMIN 3.2*   No results for input(s): "LIPASE", "AMYLASE" in the last 72 hours. Cardiac Enzymes No results for input(s): "CKTOTAL", "CKMB", "CKMBINDEX", "TROPONINI" in the last 72 hours.  BNP: BNP (last 3 results) Recent Labs    12/12/22 0616 12/19/22 1039 01/16/23 0940  BNP 926.2* 179.3* 2,150.1*    ProBNP (last 3 results) No results for input(s): "PROBNP" in the last 8760 hours.   D-Dimer No results for input(s): "DDIMER" in the last 72 hours. Hemoglobin A1C No results for input(s): "HGBA1C" in the last 72 hours. Fasting Lipid Panel No results for input(s): "CHOL", "HDL", "LDLCALC", "TRIG", "CHOLHDL", "LDLDIRECT" in the last 72 hours. Thyroid Function Tests No results for input(s): "TSH", "T4TOTAL", "T3FREE", "THYROIDAB" in the last 72 hours.  Invalid input(s): "FREET3"  Other results:   Imaging    No results found.   Medications:     Scheduled Medications:  aspirin EC  81 mg Oral Daily   atorvastatin  80 mg Oral Daily   bisoprolol  2.5 mg Oral Daily   dapagliflozin propanediol  10 mg Oral Daily   fluticasone furoate-vilanterol  1 puff Inhalation Daily   furosemide  20 mg Oral Daily   guaiFENesin  600 mg Oral  BID   multivitamin with minerals   Oral Daily   pantoprazole  40 mg Oral Daily   sacubitril-valsartan  1 tablet Oral BID   spironolactone  25 mg Oral Daily    Infusions:   PRN Medications: acetaminophen **OR** acetaminophen, ipratropium-albuterol, melatonin, Muscle Rub, ondansetron **OR** ondansetron (ZOFRAN) IV, mouth rinse, polyethylene glycol    Assessment/Plan   1. Acute on chronic systolic CHF: Suspect ischemic cardiomyopathy.  Last echo in 8/24 with EF 40-45%, moderate LVH, normal RV.  He was admitted with volume overloaded after not taking Lasix for about a month.  Careful with med titration given history of hypotension.  Volume status looks better today.  - Will give 1 more dose of Lasix 40 mg IV this morning.  Tomorrow, he will start Lasix 20 mg daily for home.  Will need standing Lasix at home.  - Continue Farxiga 10 mg daily.  - Continue Entresto 24/26 bid.  - Continue spironolactone 25 mg daily.  - Can continue bisoprolol 2.5 daily (beta-1 selective given severe COPD).  2. CAD: He had BMS to the LCx in 2002 and BMS to LAD in 2012.  No chest pain, doubt ACS.  - Continue ASA 81 - Continue statin.  3. COPD: Severe, still smoking.  On 4L home oxygen.  - Continue inhalers.  - Counseled to quit smoking.  4. Thrombocytopenia: Mild, chronic.   Stable for discharge today, will benefit from home PT.  Cardiac regimen for home: Farxiga 10 daily, Lasix 20 daily, Entresto 24/26 bid, spironolactone 25 daily, bisoprolol 2.5 daily, atorvastatin 80 daily, ASA 81 daily.   Length of Stay: 2  Alen Bleacher, NP  01/18/2023, 8:17 AM  Advanced Heart Failure Team Pager [phone] (M-F; 7a - 5p)  Please contact [facility] for night-coverage after hours (5p -7a ) and weekends on [email or web address]   Patient seen with NP, agree with the above note.   Breathing overall better.  He is on his baseline home oxygen.   General: NAD Neck: No JVD, no thyromegaly or thyroid nodule.  Lungs: Distant BS.  CV: Nondisplaced PMI.  Heart regular S1/S2, no S3/S4, no murmur.  No peripheral edema.   Abdomen: Soft, nontender, no hepatosplenomegaly, no distention.  Skin: Intact without lesions or rashes.  Neurologic: Alert and oriented x 3.  Psych: Normal affect. Extremities: No clubbing or cyanosis.  HEENT: Normal.   Patient looks euvolemic today.  Stable for home from my standpoint if he is able to walk around on his own. Needs Lasix 20 mg daily at home and close followup in CHF clinic.  See medication regimen above.   Marca Ancona 01/18/2023 8:36 AM

## 2023-01-18 NOTE — Discharge Summary (Signed)
Physician Discharge Summary   Patient: [name] MRN: [id number] DOB: 12-Jul-1945  Admit date:     01/16/2023  Discharge date: 01/18/23  Discharge Physician: Arnetha Courser   PCP: Danella Penton, MD   Recommendations at discharge:  Please obtain CBC and BMP on follow-up Follow-up with primary care provider Follow-up with cardiology  Discharge Diagnoses: Principal Problem:   Acute on chronic combined systolic and diastolic CHF (congestive heart failure) (HCC) Active Problems:   Acute on chronic hypoxic respiratory failure (HCC)   COPD (chronic obstructive pulmonary disease) (HCC)   Essential hypertension   Coronary artery disease of native artery of native heart with stable angina pectoris (HCC)   Thrombocytopenia (HCC)   Hyperlipidemia   GERD   Tobacco abuse   Chronic respiratory failure with hypoxia (HCC)   COPD exacerbation [facility])   Hospital Course: Taken from H&P.  [name] is a 77 y.o. male with medical history significant of COPD GOLD stage II, chronic hypoxic and hypercapnic respiratory failure on 4 L O2 via nasal cannula at home, anxiety/depression, CAD, MI s/p stenting, chronic HFrEF LVEF 40-45% 11/2022, stroke, neuromuscular disorder,depression, HTN, prostate cancer s/p seed implantation, AAA s/p endovascular repair in 12/2017 , intraparenchymal hemorrhage in 02/2021, pulmonary HTN, nephrolithiasis, GERD, and tobacco use.  He [facility] to [facility] ED today after experiencing worsening shortness of breath for the last several days.  Son reports last night breathing worsened and patient did not sleep at all which is abnormal for him.  Patient reports he has not been taking his Lasix because he thought it was stopped, review of records shows that Lasix was changed to PRN during recent advanced heart failure visit on 12/21/2022. Son reports ~ 6 lb weight gain over the last week when weighed at home during home health nursing visits.    On arrival to ED patient was  stable vitals, saturating 96% on 6 L of oxygen. Chest x-ray was negative for any acute abnormality or pulmonary edema.  BNP significantly elevated at 2150, platelet 132.  COVID-19 PCR negative.  Patient was given 80 mg of IV Lasix and 2 DuoNeb nebulizers. Heart failure team was also consulted. Patient was continued on IV Lasix 40 mg twice daily.  10/2: Vitals and labs stable with saturating at 97% on 6 L, cardiology is recommending 1 more day of IV Lasix and then start on p.o. at 20 mg daily from tomorrow for discharge.  Cardiac regimen for discharge is Farxiga 10 daily, Lasix 20 daily, Entresto 24/26 bid, spironolactone 25 daily, bisoprolol 2.5 daily, atorvastatin 80 daily, ASA 81 daily.   10/3: Remains stable, now weaned back to baseline oxygen requirement of 4 L.  Patient is being transitioned to p.o. Lasix as advised by heart failure team.  [name] was also added. PT was recommended home health which was ordered.  Patient will continue on current medications and need to have a close follow-up with his providers for further recommendations.  Assessment and Plan: * Acute on chronic combined systolic and diastolic CHF (congestive heart failure) (HCC) Clinically appears euvolemic today, significantly elevated BNP at 2150.  Patient was not taking his home Lasix which was made recently as needed.  Echocardiogram done 12/12/22; LVEF 40-45% with Grade I Diastolic dysfunction . Advanced heart failure team is on board. -Continue with IV Lasix today-patient will be transition to 20 mg p.o. daily from tomorrow. -Continue home GDMT -Daily weight and BMP -Strict intake and output  Acute on chronic hypoxic respiratory failure (HCC)  Initially required up to 6 L of oxygen, able to wean back to baseline use of 4 L. -Continue supplemental oxygen to keep the saturation above 90%  COPD (chronic obstructive pulmonary disease) (HCC) No acute exacerbation. - Continue home Breo-Ellipta - PRN Duoneb -  Mucinex - Supplemental O2, wean as able - Incentive Spirometer, Flutter Valve  Essential hypertension -Continue home bisoprolol, Entresto and spironolactone -Low-dose Lasix will be added on discharge  Coronary artery disease of native artery of native heart with stable angina pectoris (HCC) -Continue home aspirin and statin  Thrombocytopenia (HCC) Seems chronic and stable. -Continue to monitor  Hyperlipidemia -Continue statin  GERD -Continue Protonix  Tobacco abuse Patient does not want nicotine patch. -Counseling was provided  Consultants: Cardiology Procedures performed: None Disposition: Home Diet recommendation:  Discharge Diet Orders (From admission, onward)     Start     Ordered   01/18/23 0000  Diet - low sodium heart healthy        01/18/23 1047           Cardiac and Carb modified diet DISCHARGE MEDICATION: Allergies as of 01/18/2023       Reactions   Lyrica [pregabalin] Swelling           Medication List     STOP taking these medications    budesonide-formoterol 160-4.5 MCG/ACT inhaler Commonly known as: Symbicort   fluticasone furoate-vilanterol 200-25 MCG/ACT Aepb Commonly known as: BREO ELLIPTA       TAKE these medications    acetaminophen 325 MG tablet Commonly known as: TYLENOL Take 2 tablets (650 mg total) by mouth every 6 (six) hours as needed for mild pain (or Fever >/= 101).   albuterol 108 (90 Base) MCG/ACT inhaler Commonly known as: VENTOLIN HFA Inhale 2 puffs into the lungs every 6 (six) hours as needed for wheezing.   albuterol (2.5 MG/3ML) 0.083% nebulizer solution Commonly known as: PROVENTIL Take 2.5 mg by nebulization every 6 (six) hours as needed.   aspirin EC 81 MG tablet Take 81 mg by mouth daily.   atorvastatin 80 MG tablet Commonly known as: LIPITOR Take 1 tablet (80 mg total) by mouth daily.   bisoprolol 5 MG tablet Commonly known as: ZEBETA Take 0.5 tablets (2.5 mg total) by mouth daily.    dapagliflozin propanediol 10 MG Tabs tablet Commonly known as: FARXIGA Take 1 tablet (10 mg total) by mouth daily. Start taking on: January 19, 2023   Entresto 24-26 MG Generic drug: sacubitril-valsartan Take 1 tablet by mouth 2 (two) times daily.   furosemide 20 MG tablet Commonly known as: LASIX Take 1 tablet (20 mg total) by mouth daily. Start taking on: January 19, 2023 What changed:  medication strength how much to take when to take this reasons to take this additional instructions   mirtazapine 7.5 MG tablet Commonly known as: REMERON Take 7.5 mg by mouth at bedtime.   multivitamin with minerals tablet Take 1 tablet by mouth daily.   nicotine 14 mg/24hr patch Commonly known as: NICODERM CQ - dosed in mg/24 hours Place 1 patch (14 mg total) onto the skin daily.   omeprazole 40 MG capsule Commonly known as: PRILOSEC TAKE 1 CAPSULE BY MOUTH DAILY USUALLY BEFORE BREAKFAST   spironolactone 25 MG tablet Commonly known as: ALDACTONE Take 0.5 tablets (12.5 mg total) by mouth daily. FOR CHRONIC COMBINED CHF        Follow-up Information     Danella Penton, MD. Schedule an appointment as soon as possible  for a visit in 1 week(s).   Specialty: Internal Medicine Contact information: [phone] [facility] [address] [facility] [place] [address] [phone]         Antonieta Iba, MD. Schedule an appointment as soon as possible for a visit.   Specialty: Cardiology Contact information: [address] [place] [address] [phone]                Discharge Exam: Ceasar Mons Weights   01/16/23 3295  Weight: 82.3 kg   General.  Frail elderly man, in no acute distress. Pulmonary.  Lungs clear bilaterally, normal respiratory effort. CV.  Regular rate and rhythm, no JVD, rub or murmur. Abdomen.  Soft, nontender, nondistended, BS positive. CNS.  Alert and oriented .  No focal neurologic deficit. Extremities.  No  edema, no cyanosis, pulses intact and symmetrical. Psychiatry.  Judgment and insight appears normal.   Condition at discharge: stable  The results of significant diagnostics from this hospitalization (including imaging, microbiology, ancillary and laboratory) are listed below for reference.   Imaging Studies: DG Chest 2 View  Result Date: 01/16/2023 CLINICAL DATA:  sob EXAM: CHEST - 2 VIEW COMPARISON:  12/14/2022. FINDINGS: Cardiac silhouette enlarged. No evidence of pneumothorax or pleural effusion. No evidence of pulmonary edema. Lungs are hyperinflated consistent with COPD. Aorta is calcified and tortuous. Osseous structures are osteopenic. IMPRESSION: Enlarged cardiac silhouette. Hyperinflated lungs consistent with COPD. Electronically Signed   By: Layla Maw M.D.   On: 01/16/2023 10:38    Microbiology: Results for orders placed or performed during the hospital encounter of 01/16/23  SARS Coronavirus 2 by RT PCR (hospital order, performed in [facility] hospital lab) *cepheid single result test* Anterior Nasal Swab     Status: None   Collection Time: 01/16/23  9:40 AM   Specimen: Anterior Nasal Swab  Result Value Ref Range Status   SARS Coronavirus 2 by RT PCR NEGATIVE NEGATIVE Final    Comment: (NOTE) SARS-CoV-2 target nucleic acids are NOT DETECTED.  The SARS-CoV-2 RNA is generally detectable in upper and lower respiratory specimens during the acute phase of infection. The lowest concentration of SARS-CoV-2 viral copies this assay can detect is 250 copies / mL. A negative result does not preclude SARS-CoV-2 infection and should not be used as the sole basis for treatment or other patient management decisions.  A negative result may occur with improper specimen collection / handling, submission of specimen other than nasopharyngeal swab, presence of viral mutation(s) within the areas targeted by this assay, and inadequate number of viral copies (<250 copies / mL). A negative  result must be combined with clinical observations, patient history, and epidemiological information.  Fact Sheet for Patients:   RoadLapTop.co.za  Fact Sheet for Healthcare Providers: http://kim-miller.com/  This test is not yet approved or  cleared by the [place] FDA and has been authorized for detection and/or diagnosis of SARS-CoV-2 by FDA under an Emergency Use Authorization (EUA).  This EUA will remain in effect (meaning this test can be used) for the duration of the COVID-19 declaration under Section 564(b)(1) of the Act, 21 U.S.C. section 360bbb-3(b)(1), unless the authorization is terminated or revoked sooner.  Performed at [facility], [address] [place], [place] [address]     Labs: CBC: Recent Labs  Lab 01/16/23 0940 01/17/23 0331  WBC 7.4 8.1  HGB 12.4* 12.8*  HCT 39.7 39.9  MCV 101.5* 99.0  PLT 132* 141*   Basic Metabolic Panel: Recent  Labs  Lab 01/16/23 0940 01/17/23 0331 01/18/23 0853  NA 143 142 141  K 3.8 3.9 4.0  CL 105 98 96*  CO2 32 38* 37*  GLUCOSE 126* 126* 92  BUN 16 19 30*  CREATININE 0.94 1.14 1.34*  CALCIUM 8.4* 8.5* 8.6*   Liver Function Tests: Recent Labs  Lab 01/16/23 0940  AST 22  ALT 26  ALKPHOS 84  BILITOT 1.0  PROT 6.7  ALBUMIN 3.2*   CBG: No results for input(s): "GLUCAP" in the last 168 hours.  Discharge time spent: greater than 30 minutes.  This record has been created using Conservation officer, historic buildings. Errors have been sought and corrected,but may not always be located. Such creation errors do not reflect on the standard of care.   Signed: Arnetha Courser, MD [facility] 01/18/2023

## 2023-01-18 NOTE — TOC Transition Note (Signed)
Transition of Care [facility]) - CM/SW Discharge Note   Patient Details  Name: [name] MRN: [id number] Date of Birth: October 18, 1945  Transition of Care [facility]) CM/SW Contact:  Truddie Hidden, RN Phone Number: 01/18/2023, 10:54 AM   Clinical Narrative:    Spoke with patient regarding discharge. Patient stated his son will transport him home today. Patient advised [facility] will contact him to resume his care.   Ashely from [facility] notified.   TOC signing off.     Final next level of care: Home w Home Health Services Barriers to Discharge: Barriers Resolved   Patient Goals and CMS Choice      Discharge Placement                         Discharge Plan and Services Additional resources added to the After Visit Summary for                            [facility] Arranged: PT, OT, RN [facility] Agency: [facility] Date [facility] Agency Contacted: 01/18/23 Time HH Agency Contacted: 1054 Representative spoke with at [facility] Agency: Morrie Sheldon  Social Determinants of Health (SDOH) Interventions SDOH Screenings   Food Insecurity: No Food Insecurity (01/17/2023)  Housing: Low Risk  (01/17/2023)  Transportation Needs: No Transportation Needs (01/17/2023)  Utilities: Not At Risk (01/17/2023)  Depression (PHQ2-9): Low Risk  (09/03/2018)  Financial Resource Strain: Low Risk  (01/03/2023)   Received from [facility]  Stress: No Stress Concern Present (08/21/2018)  Tobacco Use: High Risk (01/17/2023)     Readmission Risk Interventions    04/28/2021    9:32 AM  Readmission Risk Prevention Plan  Transportation Screening Complete  PCP or Specialist Appt within 3-5 Days Complete  HRI or Home Care Consult Complete  Social Work Consult for Recovery Care Planning/Counseling Complete  Palliative Care Screening Not Applicable  Medication Review Oceanographer) Complete

## 2023-01-19 DIAGNOSIS — E261 Secondary hyperaldosteronism: Secondary | ICD-10-CM | POA: Diagnosis not present

## 2023-01-19 DIAGNOSIS — I5042 Chronic combined systolic (congestive) and diastolic (congestive) heart failure: Secondary | ICD-10-CM | POA: Diagnosis not present

## 2023-01-21 DIAGNOSIS — I5040 Unspecified combined systolic (congestive) and diastolic (congestive) heart failure: Secondary | ICD-10-CM | POA: Diagnosis not present

## 2023-01-21 DIAGNOSIS — J449 Chronic obstructive pulmonary disease, unspecified: Secondary | ICD-10-CM | POA: Diagnosis not present

## 2023-01-23 ENCOUNTER — Ambulatory Visit: Payer: PPO | Attending: Internal Medicine | Admitting: Internal Medicine

## 2023-01-23 VITALS — BP 107/57 | HR 87 | Wt 183.0 lb

## 2023-01-23 DIAGNOSIS — Z720 Tobacco use: Secondary | ICD-10-CM | POA: Diagnosis not present

## 2023-01-23 DIAGNOSIS — I25118 Atherosclerotic heart disease of native coronary artery with other forms of angina pectoris: Secondary | ICD-10-CM | POA: Diagnosis not present

## 2023-01-23 DIAGNOSIS — I10 Essential (primary) hypertension: Secondary | ICD-10-CM

## 2023-01-23 DIAGNOSIS — I5042 Chronic combined systolic (congestive) and diastolic (congestive) heart failure: Secondary | ICD-10-CM

## 2023-01-23 MED ORDER — FUROSEMIDE 20 MG PO TABS: 20.0000 mg | ORAL_TABLET | Freq: Every day | ORAL | 3 refills | Status: DC

## 2023-01-23 NOTE — Progress Notes (Signed)
Advanced Heart Failure Clinic Note    PCP: Danella Penton, MD (last seen 03/24) Cardiologist: Julien Nordmann, MD (last seen 09/23)  HPI:  [name] is a 77 y.o. male with a history of severe COPD, anxiety, stroke, neuromuscular disorder,depression, HTN, prostate cancer s/p seed implantation, hyperlipidemia, lumbar disc disease, CAD with history of posterior MI in 2002 status post BMS to the LCx status post PCI/BMS to the LAD in 2012, AAA status post endovascular repair in 12/2017 followed by vascular surgery, intraparenchymal hemorrhage in 02/2021, pulmonary HTN, nephrolithiasis, GERD, tobacco use and chronic ischemic heart failure.    Myoview in 2010 negative for ischemia with an EF of 60%. In 2012, nuclear stress testing showed anterior wall ischemia with an estimated EF of 26%. Follow-up cardiac cath on 01/05/2011 showed 95% stenosis of the proximal LAD, 95% stenosis of the mid LAD, 30% in-stent restenosis of the mid left circumflex with a second lesion of diffuse 50% stenosis.  The patient underwent successful PCI/BMS to the mid LAD with 0% residual stenosis    Admitted 04/2021 with acute on chronic combined CHF and elevated troponin peaking at 448, felt to be related to demand ischemia.  Was improved with IV diuresis. Echo 04/2021 EF 30 to 35% with GHK, mild, GIDD, normal RV with mildly enlarged cavity size, PASP 54.5 mmHg, mildly dilated left atrium, severely dilated right atrium and estimated right atrial pressure of 8 mmHg.    Admitted 12/12/22 due to new onset of productive cough, shortness of breath and wheezing for 3 days prior to presentation. In the ED patient was hypoxic with pulse ox of 80% on room air. CXR showed pulmonary vascular congestion. Given IV lasix. Echo 8/24 LVEF 40 to 45% with GHK. Improvement in ejection fraction compared to echo from 04/2021. Qualified for home oxygen.   Seen in AHF clinic 9/3, his BP in his left arm read with systolics in the 50s. BP on the right with  systolic in the 80s. His furosemide was held and GDMT decreased.     AHF clinic 9/5 labs showed possible over diuresis and SBP was soft. Symptoms suspected to be more due his advanced COPD that HF, switched lasix to PRN.   Echo 12/12/22: EF 40-45% with moderate LVH, Grade I DD, normal PA Pressure with mild MR  Admitted 01/16/23 with HF flare. Here with his granddaughter [name]  Diuresed with IV lasix. Farxiga added. No discharge weight recorded. Weight stable at home at 183. Taking lasix 20 mg daily. Now low BPs. Sons check on him doing ok. SOB with mild activity. Wearing O2. Has HHPT about 1x/week. Smoking 2-3 cigs/day.    Cardiac studies:   Echo 12/25/17: EF 30-35% grade 1 diastolic dysfunction, mildly dilated aortic root at 39 mm, mild mitral regurgitation, mildly dilated left atrium and normal pulmonary arterial pressure. Echo 10/25/18: EF 30-35% with mild LVH Echo 06/24/20: EF 30-35% with Grade 1 DD Echo 02/21/21: EF 40-45% with mild LVH, Grade I DD Echo 04/25/21: EF 30-35% with Grade I DD, mild LVH, moderately elevated PA pressure of 54.5 mmHg, mild MR Echo 12/12/22: EF 40-45% with moderate LVH, Grade I DD, normal PA Pressure with mild MR  LHC 01/05/11: 95% stenosis of the proximal LAD, 95% stenosis of the mid LAD, 30% in-stent restenosis of the mid left circumflex with a second lesion of diffuse 50% stenosis. Underwent successful PCI/BMS to the mid LAD with 0% residual stenosis .    Past Medical History:  Diagnosis Date   AAA (  abdominal aortic aneurysm) (HCC)    a.  CTA 7/19: measured 4.5 x 5.3 cm and greatest transverse dimensions   Abnormal nuclear stress test    a.  Myoview 2012: anterior wall ischemia with an estimated EF of 26%.  This was a new wall motion abnormality as well as a newly reduced EF   Brain bleed (HCC)    COPD (chronic obstructive pulmonary disease) (HCC)    Coronary artery disease    a.  Posterior MI in 2002 status post BMS to the LCx; b. LHC 2012: 95% stenosis of  the proximal LAD, 95% stenosis of the mid LAD, 30% in-stent restenosis of the mid left circumflex with a second lesion of diffuse 50% stenosis.  The patient underwent successful PCI/BMS to the mid LAD with 0% residual stenosis, LV gram not performed   GERD (gastroesophageal reflux disease)    History of kidney stones    History of prostate cancer    Hyperlipidemia    Hypertension    Myocardial infarction [facility])    Neuromuscular disorder (HCC)    Prostate cancer (HCC)    a.  Status post seeding   PVD (peripheral vascular disease) (HCC)    Stroke [facility])     Current Outpatient Medications  Medication Sig Dispense Refill   acetaminophen (TYLENOL) 325 MG tablet Take 2 tablets (650 mg total) by mouth every 6 (six) hours as needed for mild pain (or Fever >/= 101).     albuterol (PROVENTIL) (2.5 MG/3ML) 0.083% nebulizer solution Take 2.5 mg by nebulization every 6 (six) hours as needed.     albuterol (VENTOLIN HFA) 108 (90 Base) MCG/ACT inhaler Inhale 2 puffs into the lungs every 6 (six) hours as needed for wheezing. 1 each 1   aspirin EC 81 MG tablet Take 81 mg by mouth daily.     atorvastatin (LIPITOR) 80 MG tablet Take 1 tablet (80 mg total) by mouth daily. 30 tablet 0   bisoprolol (ZEBETA) 5 MG tablet Take 0.5 tablets (2.5 mg total) by mouth daily. 30 tablet 5   dapagliflozin propanediol (FARXIGA) 10 MG TABS tablet Take 1 tablet (10 mg total) by mouth daily. 30 tablet 2   furosemide (LASIX) 20 MG tablet Take 1 tablet (20 mg total) by mouth daily. 90 tablet 1   mirtazapine (REMERON) 7.5 MG tablet Take 7.5 mg by mouth at bedtime.     Multiple Vitamins-Minerals (MULTIVITAMIN WITH MINERALS) tablet Take 1 tablet by mouth daily.     nicotine (NICODERM CQ - DOSED IN MG/24 HOURS) 14 mg/24hr patch Place 1 patch (14 mg total) onto the skin daily. 28 patch 0   omeprazole (PRILOSEC) 40 MG capsule TAKE 1 CAPSULE BY MOUTH DAILY USUALLY BEFORE BREAKFAST 30 capsule 0   sacubitril-valsartan (ENTRESTO)  24-26 MG Take 1 tablet by mouth 2 (two) times daily. 180 tablet 3   spironolactone (ALDACTONE) 25 MG tablet Take 0.5 tablets (12.5 mg total) by mouth daily. FOR CHRONIC COMBINED CHF 45 tablet 3   No current facility-administered medications for this visit.    Allergies  Allergen Reactions   Lyrica [Pregabalin] Swelling          Social History   Socioeconomic History   Marital status: Married    Spouse name: Not on file   Number of children: Not on file   Years of education: Not on file   Highest education level: Not on file  Occupational History   Not on file  Tobacco Use   Smoking status:  Every Day    Current packs/day: 0.25    Average packs/day: 0.3 packs/day for 56.0 years (14.0 ttl pk-yrs)    Types: Cigarettes   Smokeless tobacco: Never   Tobacco comments:    back to smoking X1 year  Vaping Use   Vaping status: Never Used  Substance and Sexual Activity   Alcohol use: No    Alcohol/week: 0.0 standard drinks of alcohol   Drug use: No   Sexual activity: Not Currently  Other Topics Concern   Not on file  Social History Narrative   Not on file   Social Determinants of Health   Financial Resource Strain: Low Risk  (01/03/2023)   Received from [facility]   Overall Financial Resource Strain (CARDIA)    Difficulty of Paying Living Expenses: Not hard at all  Food Insecurity: No Food Insecurity (01/17/2023)   Hunger Vital Sign    Worried About Running Out of Food in the Last Year: Never true    Ran Out of Food in the Last Year: Never true  Transportation Needs: No Transportation Needs (01/17/2023)   PRAPARE - Administrator, Civil Service (Medical): No    Lack of Transportation (Non-Medical): No  Physical Activity: Not on file  Stress: No Stress Concern Present (08/21/2018)   [employer] of Occupational Health - Occupational Stress Questionnaire    Feeling of Stress : Not at all  Social Connections: Not on file  Intimate Partner  Violence: Not At Risk (01/17/2023)   Humiliation, Afraid, Rape, and Kick questionnaire    Fear of Current or Ex-Partner: No    Emotionally Abused: No    Physically Abused: No    Sexually Abused: No      Family History  Problem Relation Age of Onset   Heart attack Mother    Diabetes Mother    Heart disease Father    Lung cancer Daughter    Vitals:   01/23/23 1106  BP: (!) 107/57  Pulse: 87  SpO2: 90%  Weight: 183 lb (83 kg)   Wt Readings from Last 3 Encounters:  01/23/23 183 lb (83 kg)  01/16/23 181 lb 7 oz (82.3 kg)  12/21/22 181 lb 6.4 oz (82.3 kg)    Lab Results  Component Value Date   CREATININE 1.34 (H) 01/18/2023   CREATININE 1.14 01/17/2023   CREATININE 0.94 01/16/2023   PHYSICAL EXAM: General:  Elderly chronically-ill appearing. Wearing O2. No resp difficulty HEENT: normal Neck: supple. JVP 9-10  Carotids 2+ bilat; no bruits. No lymphadenopathy or thryomegaly appreciated. Cor: Distant HS No murmur Lungs: decreased throughout Abdomen: soft, nontender, nondistended. No hepatosplenomegaly. No bruits or masses. Good bowel sounds. Extremities: no cyanosis, clubbing, rash, edema Neuro: alert & orientedx3, cranial nerves grossly intact. moves all 4 extremities w/o difficulty. Affect pleasant   ASSESSMENT & PLAN:  1: Ischemic heart failure with mildly reduced ejection fraction- - due to CAD with stent 2012 - Stable NYHA III. Main limitation likely COPD and deconditioning - Echo 12/25/17: EF 30-35% grade 1 diastolic dysfunction - Echo 10/25/18: EF 30-35% with mild LVH - Echo 06/24/20: EF 30-35% with Grade 1 DD - Echo 02/21/21: EF 40-45% with mild LVH, Grade I DD - Echo 04/25/21: EF 30-35% with Grade I DD, mild LVH, moderately elevated PA pressure of 54.5 mmHg, mild MR - Echo 12/12/22: EF 40-45% with moderate LVH, Grade I DD, normal PA Pressure with mild MR - NYHA III-IIIB  - Volume status just mildly elevated - Increase  lasix to 40 mg on Monday and Fridays. Continue  lasix 20mg  daily on other days. Discussed use of sliding scale lasix - Continue spironolactone 12.5mg  daily - Continue bisoprolol 2.5mg  daily and Entresto 24/26 twice daily - Continue Farxiga 10 mg daily - Continue ENtresto 24/26 bid  - Would titrate CHF meds slowly as he previously didn't tolerated higher doses 2/2 significant hypotension. No change today - Check labs today  2: HTN- - Blood pressure well controlled. Continue current regimen.  3: CAD- - continue ASA 81mg  daily - continue atorvastatin 80mg  daily - LHC 01/05/11: 95% stenosis of the proximal LAD, 95% stenosis of the mid LAD, 30% in-stent restenosis of the mid left circumflex with a second lesion of diffuse 50% stenosis. Underwent successful PCI/BMS to the mid LAD with 0% residual stenosis .  - No s/s angina  4: COPD with ongoing tobacco use - Continue 3L O2 at baseline - Needs incentive spirometer - continues to use inhalers - continues to smoke but takes his oxygen off and doesn't smoke anywhere near the oxygen - we discussed need for smoking cessation   5. Frequent PVCs/PACs - Keep K>4 and Mg >2     Arvilla Meres, MD 01/23/23

## 2023-01-23 NOTE — Patient Instructions (Addendum)
Medication Changes:  Please Increase your Lasix to 40 mg (2 tablets) daily on Monday and Friday ONLY.  Then Keep Lasix 20 mg (1 tablet) daily on the other days.   Lab Work:  Labs done today, your results will be available in MyChart, we will contact you for abnormal readings.   Special Instructions // Education:  Do the following things EVERYDAY: Weigh yourself in the morning before breakfast. Write it down and keep it in a log. Take your medicines as prescribed Eat low salt foods--Limit salt (sodium) to 2000 mg per day.  Stay as active as you can everyday Limit all fluids for the day to less than 2 liters   Follow-Up in: follow up in 1 Months with Clarisa Kindred, FNP.    If you have any questions or concerns before your next appointment please send [place] a message through [place] or call our office at [phone] Monday-Friday 8 am-5 pm.   If you have an urgent need after hours on the weekend please call your Primary Cardiologist or the Advanced Heart Failure Clinic in [place] at [phone].   At the Advanced Heart Failure Clinic, you and your health needs are our priority. We have a designated team specialized in the treatment of Heart Failure. This Care Team includes your primary Heart Failure Specialized Cardiologist (physician), Advanced Practice Providers (APPs- Physician Assistants and Nurse Practitioners), and Pharmacist who all work together to provide you with the care you need, when you need it.   You may see any of the following providers on your designated Care Team at your next follow up:  Dr. Arvilla Meres Dr. Marca Ancona Dr. Dorthula Nettles Dr. Theresia Bough Tonye Becket, NP Robbie Lis, [place] [address] [place], [place] Brynda Peon, NP [place] Lee, NP Clarisa Kindred, NP Enos Fling, PharmD

## 2023-01-24 LAB — BASIC METABOLIC PANEL
BUN/Creatinine Ratio: 16 (ref 10–24)
BUN: 17 mg/dL (ref 8–27)
CO2: 30 mmol/L — ABNORMAL HIGH (ref 20–29)
Calcium: 8.5 mg/dL — ABNORMAL LOW (ref 8.6–10.2)
Chloride: 104 mmol/L (ref 96–106)
Creatinine, Ser: 1.08 mg/dL (ref 0.76–1.27)
Glucose: 83 mg/dL (ref 70–99)
Potassium: 3.9 mmol/L (ref 3.5–5.2)
Sodium: 148 mmol/L — ABNORMAL HIGH (ref 134–144)
eGFR: 70 mL/min/{1.73_m2} (ref >59)

## 2023-01-24 LAB — BRAIN NATRIURETIC PEPTIDE: BNP: 663.3 pg/mL — ABNORMAL HIGH (ref 0.0–100.0)

## 2023-01-25 DIAGNOSIS — M79674 Pain in right toe(s): Secondary | ICD-10-CM | POA: Diagnosis not present

## 2023-01-25 DIAGNOSIS — L600 Ingrowing nail: Secondary | ICD-10-CM | POA: Diagnosis not present

## 2023-01-25 DIAGNOSIS — M79675 Pain in left toe(s): Secondary | ICD-10-CM | POA: Diagnosis not present

## 2023-01-25 DIAGNOSIS — B351 Tinea unguium: Secondary | ICD-10-CM | POA: Diagnosis not present

## 2023-02-21 DIAGNOSIS — J449 Chronic obstructive pulmonary disease, unspecified: Secondary | ICD-10-CM | POA: Diagnosis not present

## 2023-02-21 DIAGNOSIS — I5040 Unspecified combined systolic (congestive) and diastolic (congestive) heart failure: Secondary | ICD-10-CM | POA: Diagnosis not present

## 2023-02-26 NOTE — Progress Notes (Deleted)
Advanced Heart Failure Clinic Note    PCP: Danella Penton, MD (last seen 03/24) Cardiologist: Julien Nordmann, MD (last seen 09/23)  HPI:  [name] is a 77 y/o male with a history of COPD stage II, anxiety, stroke, neuromuscular disorder,depression, HTN, prostate cancer s/p seed implantation, hyperlipidemia, lumbar disc disease, CAD with history of posterior MI in 2002 status post BMS to the LCx status post PCI/BMS to the LAD in 2012, AAA status post endovascular repair in 12/2017 followed by vascular surgery, intraparenchymal hemorrhage in 02/2021, pulmonary HTN, nephrolithiasis, GERD, tobacco use and chronic ischemic heart failure.   He underwent Myoview in 2010 that was negative for ischemia with an EF of 60%. In 2012, nuclear stress testing showed anterior wall ischemia with an estimated EF of 26%. Follow-up cardiac cath on 01/05/2011 showed 95% stenosis of the proximal LAD, 95% stenosis of the mid LAD, 30% in-stent restenosis of the mid left circumflex with a second lesion of diffuse 50% stenosis.  The patient underwent successful PCI/BMS to the mid LAD with 0% residual stenosis   Admitted to the hospital in 04/2021 with acute on chronic combined CHF and elevated troponin peaking at 448, felt to be related to demand ischemia.  Was improved with IV diuresis. Echo in 04/2021 demonstrating an EF of 30 to 35% with global hypokinesis, mild, grade 1 diastolic dysfunction, normal RV systolic function with mildly enlarged cavity size, PASP 54.5 mmHg, mildly dilated left atrium, severely dilated right atrium and estimated right atrial pressure of 8 mmHg.   Admitted 12/12/22 due to new onset of productive cough, shortness of breath and wheezing for 3 days prior to presentation. In the ED patient was hypoxic with pulse ox of 80% on room air. Chest x-ray showed pulmonary vascular congestion. Given IV lasix. Echocardiogram this admission shows LV ejection fraction of 40 to 45% with global hypokinesis.  Improvement in ejection fraction compared to echo from 04/2021. Qualified for home oxygen.    Echo 12/25/17: EF 30-35% grade 1 diastolic dysfunction, mildly dilated aortic root at 39 mm, mild mitral regurgitation, mildly dilated left atrium and normal pulmonary arterial pressure. Echo 10/25/18: EF 30-35% with mild LVH Echo 06/24/20: EF 30-35% with Grade 1 DD Echo 02/21/21: EF 40-45% with mild LVH, Grade I DD Echo 04/25/21: EF 30-35% with Grade I DD, mild LVH, moderately elevated PA pressure of 54.5 mmHg, mild Nathaniel Echo 12/12/22: EF 40-45% with moderate LVH, Grade I DD, normal PA Pressure with mild Nathaniel  LHC 01/05/11: 95% stenosis of the proximal LAD, 95% stenosis of the mid LAD, 30% in-stent restenosis of the mid left circumflex with a second lesion of diffuse 50% stenosis. Underwent successful PCI/BMS to the mid LAD with 0% residual stenosis .   He presents today for his initial HF visit with a chief complaint of moderate fatigue with minimal exertion. Chronic in nature.  Has associated shortness of breath, cough and associated dizziness with sudden position changes along with this. Wearing oxygen at 2-3L around the clock. Denies chest pain, palpitations, abdominal distention, pedal edema, difficulty sleeping or weight gain.   At discharge, he was instructed to take furosemide 40mg  BID for 3 days and then decrease it back to once daily. He started the once daily dose yesterday. Checking blood pressure at home infrequently and says that they are getting SBP of upper 80's/ low 90's at home.      ROS: All systems negative except as listed in HPI, PMH and Problem List.  SH:  Social History   Socioeconomic History   Marital status: Married    Spouse name: Not on file   Number of children: Not on file   Years of education: Not on file   Highest education level: Not on file  Occupational History   Not on file  Tobacco Use   Smoking status: Every Day    Current packs/day: 0.25    Average packs/day:  0.3 packs/day for 56.0 years (14.0 ttl pk-yrs)    Types: Cigarettes   Smokeless tobacco: Never   Tobacco comments:    back to smoking X1 year  Vaping Use   Vaping status: Never Used  Substance and Sexual Activity   Alcohol use: No    Alcohol/week: 0.0 standard drinks of alcohol   Drug use: No   Sexual activity: Not Currently  Other Topics Concern   Not on file  Social History Narrative   Not on file   Social Determinants of Health   Financial Resource Strain: Low Risk  (01/03/2023)   Received from [facility]   Overall Financial Resource Strain (CARDIA)    Difficulty of Paying Living Expenses: Not hard at all  Food Insecurity: No Food Insecurity (01/17/2023)   Hunger Vital Sign    Worried About Running Out of Food in the Last Year: Never true    Ran Out of Food in the Last Year: Never true  Transportation Needs: No Transportation Needs (01/17/2023)   PRAPARE - Administrator, Civil Service (Medical): No    Lack of Transportation (Non-Medical): No  Physical Activity: Not on file  Stress: No Stress Concern Present (08/21/2018)   [employer] of Occupational Health - Occupational Stress Questionnaire    Feeling of Stress : Not at all  Social Connections: Not on file  Intimate Partner Violence: Not At Risk (01/17/2023)   Humiliation, Afraid, Rape, and Kick questionnaire    Fear of Current or Ex-Partner: No    Emotionally Abused: No    Physically Abused: No    Sexually Abused: No    FH:  Family History  Problem Relation Age of Onset   Heart attack Mother    Diabetes Mother    Heart disease Father    Lung cancer Daughter     Past Medical History:  Diagnosis Date   AAA (abdominal aortic aneurysm) (HCC)    a.  CTA 7/19: measured 4.5 x 5.3 cm and greatest transverse dimensions   Abnormal nuclear stress test    a.  Myoview 2012: anterior wall ischemia with an estimated EF of 26%.  This was a new wall motion abnormality as well as a newly  reduced EF   Brain bleed (HCC)    COPD (chronic obstructive pulmonary disease) (HCC)    Coronary artery disease    a.  Posterior MI in 2002 status post BMS to the LCx; b. LHC 2012: 95% stenosis of the proximal LAD, 95% stenosis of the mid LAD, 30% in-stent restenosis of the mid left circumflex with a second lesion of diffuse 50% stenosis.  The patient underwent successful PCI/BMS to the mid LAD with 0% residual stenosis, LV gram not performed   GERD (gastroesophageal reflux disease)    History of kidney stones    History of prostate cancer    Hyperlipidemia    Hypertension    Myocardial infarction [facility])    Neuromuscular disorder (HCC)    Prostate cancer (HCC)    a.  Status post seeding   PVD (peripheral  vascular disease) (HCC)    Stroke [facility])     Current Outpatient Medications  Medication Sig Dispense Refill   acetaminophen (TYLENOL) 325 MG tablet Take 2 tablets (650 mg total) by mouth every 6 (six) hours as needed for mild pain (or Fever >/= 101).     albuterol (PROVENTIL) (2.5 MG/3ML) 0.083% nebulizer solution Take 2.5 mg by nebulization every 6 (six) hours as needed.     albuterol (VENTOLIN HFA) 108 (90 Base) MCG/ACT inhaler Inhale 2 puffs into the lungs every 6 (six) hours as needed for wheezing. 1 each 1   aspirin EC 81 MG tablet Take 81 mg by mouth daily.     atorvastatin (LIPITOR) 80 MG tablet Take 1 tablet (80 mg total) by mouth daily. 30 tablet 0   bisoprolol (ZEBETA) 5 MG tablet Take 0.5 tablets (2.5 mg total) by mouth daily. 30 tablet 5   dapagliflozin propanediol (FARXIGA) 10 MG TABS tablet Take 1 tablet (10 mg total) by mouth daily. 30 tablet 2   furosemide (LASIX) 20 MG tablet Take 1 tablet (20 mg total) by mouth daily. Take 40 mg (2 tablets) on Monday and Friday only. Take Lasix 20 mg (1 tablet) on the other days. 180 tablet 3   mirtazapine (REMERON) 7.5 MG tablet Take 7.5 mg by mouth at bedtime.     Multiple Vitamins-Minerals (MULTIVITAMIN WITH MINERALS) tablet Take 1  tablet by mouth daily.     nicotine (NICODERM CQ - DOSED IN MG/24 HOURS) 14 mg/24hr patch Place 1 patch (14 mg total) onto the skin daily. 28 patch 0   omeprazole (PRILOSEC) 40 MG capsule TAKE 1 CAPSULE BY MOUTH DAILY USUALLY BEFORE BREAKFAST 30 capsule 0   sacubitril-valsartan (ENTRESTO) 24-26 MG Take 1 tablet by mouth 2 (two) times daily. 180 tablet 3   spironolactone (ALDACTONE) 25 MG tablet Take 0.5 tablets (12.5 mg total) by mouth daily. FOR CHRONIC COMBINED CHF 45 tablet 3   No current facility-administered medications for this visit.    There were no vitals filed for this visit.  PHYSICAL EXAM:  General:  Well appearing. No resp difficulty HEENT: normal Neck: supple. JVP flat. Carotids 2+ bilaterally; no bruits. No lymphadenopathy or thryomegaly appreciated. Cor: PMI normal. Regular rate & rhythm. No rubs, gallops or murmurs. Lungs: clear Abdomen: soft, nontender, nondistended. No hepatosplenomegaly. No bruits or masses. Good bowel sounds. Extremities: no cyanosis, clubbing, rash, edema Neuro: alert & orientedx3, cranial nerves grossly intact. Moves all 4 extremities w/o difficulty. Affect pleasant.   ECG:   ASSESSMENT & PLAN:  1: Ischemic heart failure with mildly reduced ejection fraction- - due to CAD with stent 2012 - NYHA class III - euvolemic - weighing daily & reports stable weight since discharge - Echo 12/25/17: EF 30-35% grade 1 diastolic dysfunction, mildly dilated aortic root at 39 mm, mild mitral regurgitation, mildly dilated left atrium and normal pulmonary arterial pressure. - Echo 10/25/18: EF 30-35% with mild LVH - Echo 06/24/20: EF 30-35% with Grade 1 DD - Echo 02/21/21: EF 40-45% with mild LVH, Grade I DD - Echo 04/25/21: EF 30-35% with Grade I DD, mild LVH, moderately elevated PA pressure of 54.5 mmHg, mild Nathaniel - Echo 12/12/22: EF 40-45% with moderate LVH, Grade I DD, normal PA Pressure with mild Nathaniel - not adding salt to his food - rarely drinks  water and drinks 1 cup of coffee and 5- 6 bottles of mountain dew daily; encouraged him to substitute 1-2 glasses of water for his mtn dew -  due to BP stop carvedilol and begin bisoprolol 2.5mg  daily at bedtime (will also be better w/ his COPD) - decrease entresto from 97/103mg  to 24/26mg  BID (samples provided by RN) - took 40mg  furosemide BID  X 3 days at hospital discharge - hold furosemide/ spironolactone for 2 days - BNP 12/11/21 was 926.2  2: HTN- - BP initially 54/45 left arm, 58/44 right arm, manually in right arm 80/50 - difficulty in getting BP reading due to weak pulse in left arm - med changes per above - CMP, BNP and CBC today - check BP daily, write it down and bring log in for review - saw PCP [name]) 03/24 - BMP 12/14/22 showed sodium 143, potassium 3.5, creatinine 1.1 and GFR >60  3: CAD- - saw cardiology ([place]) 09/23 - continue ASA 81mg  daily - continue atorvastatin 80mg  daily - LHC 01/05/11: 95% stenosis of the proximal LAD, 95% stenosis of the mid LAD, 30% in-stent restenosis of the mid left circumflex with a second lesion of diffuse 50% stenosis. Underwent successful PCI/BMS to the mid LAD with 0% residual stenosis .   4: COPD- - wearing oxygen at 2-3L around the clock - continues to use inhalers - continues to smoke but takes his oxygen off and doesn't smoke anywhere near the oxygen  Return in 3 days for recheck of BP. Should BP worsen or he becomes more dizzy/ fatigued, advised that he should present to the ED. Encouraged him to drink 1-2 glasses of water in place of his mtn dew.    Delma Freeze, FNP 12/18/22

## 2023-02-27 ENCOUNTER — Encounter: Payer: PPO | Admitting: Family

## 2023-02-27 ENCOUNTER — Telehealth: Payer: Self-pay | Admitting: Family

## 2023-02-27 NOTE — Telephone Encounter (Signed)
Patient did not show for his Heart Failure Clinic appointment on 02/27/23.

## 2023-03-13 DIAGNOSIS — I509 Heart failure, unspecified: Secondary | ICD-10-CM | POA: Diagnosis not present

## 2023-03-13 DIAGNOSIS — Z515 Encounter for palliative care: Secondary | ICD-10-CM | POA: Diagnosis not present

## 2023-03-13 DIAGNOSIS — E261 Secondary hyperaldosteronism: Secondary | ICD-10-CM | POA: Diagnosis not present

## 2023-03-13 DIAGNOSIS — Z6824 Body mass index (BMI) 24.0-24.9, adult: Secondary | ICD-10-CM | POA: Diagnosis not present

## 2023-03-22 ENCOUNTER — Telehealth: Payer: Self-pay | Admitting: Family

## 2023-03-22 NOTE — Telephone Encounter (Signed)
Pt confirmed appt for 03/23/23

## 2023-03-23 ENCOUNTER — Ambulatory Visit: Payer: PPO | Attending: Family | Admitting: Family

## 2023-03-23 ENCOUNTER — Encounter: Payer: Self-pay | Admitting: Family

## 2023-03-23 VITALS — BP 108/60 | HR 61 | Wt 181.1 lb

## 2023-03-23 DIAGNOSIS — Z79899 Other long term (current) drug therapy: Secondary | ICD-10-CM | POA: Diagnosis not present

## 2023-03-23 DIAGNOSIS — I110 Hypertensive heart disease with heart failure: Secondary | ICD-10-CM | POA: Insufficient documentation

## 2023-03-23 DIAGNOSIS — Z8679 Personal history of other diseases of the circulatory system: Secondary | ICD-10-CM | POA: Insufficient documentation

## 2023-03-23 DIAGNOSIS — I2510 Atherosclerotic heart disease of native coronary artery without angina pectoris: Secondary | ICD-10-CM | POA: Insufficient documentation

## 2023-03-23 DIAGNOSIS — Z955 Presence of coronary angioplasty implant and graft: Secondary | ICD-10-CM | POA: Diagnosis not present

## 2023-03-23 DIAGNOSIS — F419 Anxiety disorder, unspecified: Secondary | ICD-10-CM | POA: Diagnosis not present

## 2023-03-23 DIAGNOSIS — E785 Hyperlipidemia, unspecified: Secondary | ICD-10-CM | POA: Insufficient documentation

## 2023-03-23 DIAGNOSIS — Z9981 Dependence on supplemental oxygen: Secondary | ICD-10-CM | POA: Insufficient documentation

## 2023-03-23 DIAGNOSIS — Z7982 Long term (current) use of aspirin: Secondary | ICD-10-CM | POA: Diagnosis not present

## 2023-03-23 DIAGNOSIS — K219 Gastro-esophageal reflux disease without esophagitis: Secondary | ICD-10-CM | POA: Insufficient documentation

## 2023-03-23 DIAGNOSIS — R058 Other specified cough: Secondary | ICD-10-CM | POA: Insufficient documentation

## 2023-03-23 DIAGNOSIS — I272 Pulmonary hypertension, unspecified: Secondary | ICD-10-CM | POA: Insufficient documentation

## 2023-03-23 DIAGNOSIS — I493 Ventricular premature depolarization: Secondary | ICD-10-CM | POA: Diagnosis not present

## 2023-03-23 DIAGNOSIS — Z8673 Personal history of transient ischemic attack (TIA), and cerebral infarction without residual deficits: Secondary | ICD-10-CM | POA: Insufficient documentation

## 2023-03-23 DIAGNOSIS — J449 Chronic obstructive pulmonary disease, unspecified: Secondary | ICD-10-CM | POA: Insufficient documentation

## 2023-03-23 DIAGNOSIS — I5040 Unspecified combined systolic (congestive) and diastolic (congestive) heart failure: Secondary | ICD-10-CM | POA: Diagnosis not present

## 2023-03-23 DIAGNOSIS — F32A Depression, unspecified: Secondary | ICD-10-CM | POA: Diagnosis not present

## 2023-03-23 DIAGNOSIS — F17210 Nicotine dependence, cigarettes, uncomplicated: Secondary | ICD-10-CM | POA: Diagnosis not present

## 2023-03-23 DIAGNOSIS — I252 Old myocardial infarction: Secondary | ICD-10-CM | POA: Insufficient documentation

## 2023-03-23 DIAGNOSIS — I5042 Chronic combined systolic (congestive) and diastolic (congestive) heart failure: Secondary | ICD-10-CM | POA: Insufficient documentation

## 2023-03-23 DIAGNOSIS — Z7984 Long term (current) use of oral hypoglycemic drugs: Secondary | ICD-10-CM | POA: Diagnosis not present

## 2023-03-23 NOTE — Patient Instructions (Signed)
Great to see you today!!!  Continue current medications  Labs will be done today, we will call you for abnormal results  Your physician recommends that you schedule a follow-up appointment in: 6-8 weeks  Do the following things EVERYDAY: Weigh yourself in the morning before breakfast. Write it down and keep it in a log. Take your medicines as prescribed Eat low salt foods--Limit salt (sodium) to 2000 mg per day.  Stay as active as you can everyday Limit all fluids for the day to less than 2 liters

## 2023-03-23 NOTE — Progress Notes (Signed)
Advanced Heart Failure Clinic Note    PCP: Danella Penton, MD (last seen 09/24) Cardiologist: Julien Nordmann, MD (last seen 09/23)  HPI:  [name] is a 77 y/o male with a history of severe COPD, anxiety, stroke, neuromuscular disorder,depression, HTN, prostate cancer s/p seed implantation, hyperlipidemia, lumbar disc disease, CAD with history of posterior MI in 2002 status post BMS to the LCx status post PCI/BMS to the LAD in 2012, AAA status post endovascular repair in 12/2017 followed by vascular surgery, intraparenchymal hemorrhage in 02/2021, pulmonary HTN, nephrolithiasis, GERD, tobacco use and chronic ischemic heart failure.   He underwent Myoview in 2010 that was negative for ischemia with an EF of 60%. In 2012, nuclear stress testing showed anterior wall ischemia with an estimated EF of 26%. Follow-up cardiac cath on 01/05/2011 showed 95% stenosis of the proximal LAD, 95% stenosis of the mid LAD, 30% in-stent restenosis of the mid left circumflex with a second lesion of diffuse 50% stenosis.  The patient underwent successful PCI/BMS to the mid LAD with 0% residual stenosis   Admitted to the hospital in 04/2021 with acute on chronic combined CHF and elevated troponin peaking at 448, felt to be related to demand ischemia.  Was improved with IV diuresis. Echo in 04/2021 demonstrating an EF of 30 to 35% with global hypokinesis, mild, grade 1 diastolic dysfunction, normal RV systolic function with mildly enlarged cavity size, PASP 54.5 mmHg, mildly dilated left atrium, severely dilated right atrium and estimated right atrial pressure of 8 mmHg.   Admitted 12/12/22 due to new onset of productive cough, shortness of breath and wheezing for 3 days prior to presentation. In the ED patient was hypoxic with pulse ox of 80% on room air. Chest x-ray showed pulmonary vascular congestion. Given IV lasix. Echocardiogram this admission shows LV ejection fraction of 40 to 45% with global hypokinesis.  Improvement in ejection fraction compared to echo from 04/2021. Qualified for home oxygen.    Seen in AHF clinic 9/324, his BP in his left arm read with systolics in the 50s. BP on the right with systolic in the 80s. His furosemide was held and GDMT decreased.     AHF clinic 12/21/22 labs showed possible over diuresis and SBP was soft. Symptoms suspected to be more due his advanced COPD that HF, switched lasix to PRN.    Echo 12/12/22: EF 40-45% with moderate LVH, Grade I DD, normal PA Pressure with mild Nathaniel   Admitted 01/16/23 with HF flare. Diuresed with IV lasix. Farxiga added. No discharge weight recorded.   He presents today for a HF follow-up visit with a chief complaint of moderate fatigue. Chronic in nature. Gets short of breath when he doesn't have his oxygen on. No SOB otherwise. Has associated productive cough and chronic difficulty sleeping along with this. Denies chest pain, palpitations, abdominal distention, pedal edema, dizziness or weight gain. He had his bisoprolol stopped due to bradycardia and hypotension.   Reports having a good appetite. Drinking 3 bottles of water daily.   Wearing oxygen at 3L around the clock although does remove it when smoking. Smoking 1/2 ppd cigarettes.    Previous cardiac studies:  Echo 12/25/17: EF 30-35% grade 1 diastolic dysfunction, mildly dilated aortic root at 39 mm, mild mitral regurgitation, mildly dilated left atrium and normal pulmonary arterial pressure. Echo 10/25/18: EF 30-35% with mild LVH Echo 06/24/20: EF 30-35% with Grade 1 DD Echo 02/21/21: EF 40-45% with mild LVH, Grade I DD Echo 04/25/21: EF 30-35%  with Grade I DD, mild LVH, moderately elevated PA pressure of 54.5 mmHg, mild Nathaniel Echo 12/12/22: EF 40-45% with moderate LVH, Grade I DD, normal PA Pressure with mild Nathaniel  LHC 01/05/11: 95% stenosis of the proximal LAD, 95% stenosis of the mid LAD, 30% in-stent restenosis of the mid left circumflex with a second lesion of diffuse 50% stenosis.  Underwent successful PCI/BMS to the mid LAD with 0% residual stenosis .   ROS: All systems negative except as listed in HPI, PMH and Problem List.  SH:  Social History   Socioeconomic History   Marital status: Married    Spouse name: Not on file   Number of children: Not on file   Years of education: Not on file   Highest education level: Not on file  Occupational History   Not on file  Tobacco Use   Smoking status: Every Day    Current packs/day: 0.25    Average packs/day: 0.3 packs/day for 56.0 years (14.0 ttl pk-yrs)    Types: Cigarettes   Smokeless tobacco: Never   Tobacco comments:    back to smoking X1 year  Vaping Use   Vaping status: Never Used  Substance and Sexual Activity   Alcohol use: No    Alcohol/week: 0.0 standard drinks of alcohol   Drug use: No   Sexual activity: Not Currently  Other Topics Concern   Not on file  Social History Narrative   Not on file   Social Determinants of Health   Financial Resource Strain: Low Risk  (01/03/2023)   Received from [facility]   Overall Financial Resource Strain (CARDIA)    Difficulty of Paying Living Expenses: Not hard at all  Food Insecurity: No Food Insecurity (01/17/2023)   Hunger Vital Sign    Worried About Running Out of Food in the Last Year: Never true    Ran Out of Food in the Last Year: Never true  Transportation Needs: No Transportation Needs (01/17/2023)   PRAPARE - Administrator, Civil Service (Medical): No    Lack of Transportation (Non-Medical): No  Physical Activity: Not on file  Stress: No Stress Concern Present (08/21/2018)   [employer] of Occupational Health - Occupational Stress Questionnaire    Feeling of Stress : Not at all  Social Connections: Not on file  Intimate Partner Violence: Not At Risk (01/17/2023)   Humiliation, Afraid, Rape, and Kick questionnaire    Fear of Current or Ex-Partner: No    Emotionally Abused: No    Physically Abused: No     Sexually Abused: No    FH:  Family History  Problem Relation Age of Onset   Heart attack Mother    Diabetes Mother    Heart disease Father    Lung cancer Daughter     Past Medical History:  Diagnosis Date   AAA (abdominal aortic aneurysm) (HCC)    a.  CTA 7/19: measured 4.5 x 5.3 cm and greatest transverse dimensions   Abnormal nuclear stress test    a.  Myoview 2012: anterior wall ischemia with an estimated EF of 26%.  This was a new wall motion abnormality as well as a newly reduced EF   Brain bleed (HCC)    COPD (chronic obstructive pulmonary disease) (HCC)    Coronary artery disease    a.  Posterior MI in 2002 status post BMS to the LCx; b. LHC 2012: 95% stenosis of the proximal LAD, 95% stenosis of the mid LAD,  30% in-stent restenosis of the mid left circumflex with a second lesion of diffuse 50% stenosis.  The patient underwent successful PCI/BMS to the mid LAD with 0% residual stenosis, LV gram not performed   GERD (gastroesophageal reflux disease)    History of kidney stones    History of prostate cancer    Hyperlipidemia    Hypertension    Myocardial infarction [facility])    Neuromuscular disorder (HCC)    Prostate cancer (HCC)    a.  Status post seeding   PVD (peripheral vascular disease) (HCC)    Stroke [facility])     Current Outpatient Medications  Medication Sig Dispense Refill   acetaminophen (TYLENOL) 325 MG tablet Take 2 tablets (650 mg total) by mouth every 6 (six) hours as needed for mild pain (or Fever >/= 101).     albuterol (PROVENTIL) (2.5 MG/3ML) 0.083% nebulizer solution Take 2.5 mg by nebulization every 6 (six) hours as needed.     albuterol (VENTOLIN HFA) 108 (90 Base) MCG/ACT inhaler Inhale 2 puffs into the lungs every 6 (six) hours as needed for wheezing. 1 each 1   aspirin EC 81 MG tablet Take 81 mg by mouth daily.     atorvastatin (LIPITOR) 80 MG tablet Take 1 tablet (80 mg total) by mouth daily. 30 tablet 0   bisoprolol (ZEBETA) 5 MG tablet Take 0.5  tablets (2.5 mg total) by mouth daily. 30 tablet 5   dapagliflozin propanediol (FARXIGA) 10 MG TABS tablet Take 1 tablet (10 mg total) by mouth daily. 30 tablet 2   furosemide (LASIX) 20 MG tablet Take 1 tablet (20 mg total) by mouth daily. Take 40 mg (2 tablets) on Monday and Friday only. Take Lasix 20 mg (1 tablet) on the other days. 180 tablet 3   mirtazapine (REMERON) 7.5 MG tablet Take 7.5 mg by mouth at bedtime.     Multiple Vitamins-Minerals (MULTIVITAMIN WITH MINERALS) tablet Take 1 tablet by mouth daily.     nicotine (NICODERM CQ - DOSED IN MG/24 HOURS) 14 mg/24hr patch Place 1 patch (14 mg total) onto the skin daily. 28 patch 0   omeprazole (PRILOSEC) 40 MG capsule TAKE 1 CAPSULE BY MOUTH DAILY USUALLY BEFORE BREAKFAST 30 capsule 0   sacubitril-valsartan (ENTRESTO) 24-26 MG Take 1 tablet by mouth 2 (two) times daily. 180 tablet 3   spironolactone (ALDACTONE) 25 MG tablet Take 0.5 tablets (12.5 mg total) by mouth daily. FOR CHRONIC COMBINED CHF 45 tablet 3   No current facility-administered medications for this visit.   Vitals:   03/23/23 1309  BP: 108/60  Pulse: 61  Weight: 181 lb 2 oz (82.2 kg)   Wt Readings from Last 3 Encounters:  03/23/23 181 lb 2 oz (82.2 kg)  01/23/23 183 lb (83 kg)  01/16/23 181 lb 7 oz (82.3 kg)   Lab Results  Component Value Date   CREATININE 1.08 01/23/2023   CREATININE 1.34 (H) 01/18/2023   CREATININE 1.14 01/17/2023   PHYSICAL EXAM:  General:  Well appearing. No resp difficulty HEENT: normal Neck: supple. JVP flat. No lymphadenopathy or thryomegaly appreciated. Cor: PMI normal. Regular rate & rhythm. No rubs, gallops or murmurs. Lungs: clear Abdomen: soft, nontender, nondistended. No hepatosplenomegaly. No bruits or masses.  Extremities: no cyanosis, clubbing, rash, edema Neuro: alert & oriented x3, cranial nerves grossly intact. Moves all 4 extremities w/o difficulty. Affect pleasant.   ECG: not done   ASSESSMENT &  PLAN:  1: Ischemic heart failure with mildly reduced ejection fraction- -  due to CAD with stent 2012 - NYHA class III; main limitation likely deconditioning and COPD - euvolemic - weighing daily & reports stable weight since discharge - weight down 2 pounds from last visit here 2 months ago - Echo 12/25/17: EF 30-35% grade 1 diastolic dysfunction, mildly dilated aortic root at 39 mm, mild mitral regurgitation, mildly dilated left atrium and normal pulmonary arterial pressure. - Echo 10/25/18: EF 30-35% with mild LVH - Echo 06/24/20: EF 30-35% with Grade 1 DD - Echo 02/21/21: EF 40-45% with mild LVH, Grade I DD - Echo 04/25/21: EF 30-35% with Grade I DD, mild LVH, moderately elevated PA pressure of 54.5 mmHg, mild Nathaniel - Echo 12/12/22: EF 40-45% with moderate LVH, Grade I DD, normal PA Pressure with mild Nathaniel - not adding salt to his food - no longer drinking soda and drinks 3 water bottles daily - continue farxiga 10mg  daily - continue furosemide 40mg  M & F and 20mg  the other days of the week - continue entresto 24/26mg  BID - continue spironolactone 12.5mg  daily - had bisoprolol stopped due to bradycardia/ hypotension - BMET today - would titrate CHF meds slowly as he previously didn't tolerated higher doses 2/2 significant hypotension. No change today  - BNP 01/23/23 was 663.3  2: HTN- - BP 108/60 - saw PCP Hyacinth Meeker) 09/24 - BMP 01/23/23 showed sodium 148, potassium 3.9, creatinine 1.08 and GFR 70 - BMET today  3: CAD- - saw cardiology (D[place]) 09/23 - continue ASA 81mg  daily - continue atorvastatin 80mg  daily - LDL 12/29/22 was 67 - no s/s angina - LHC 01/05/11: 95% stenosis of the proximal LAD, 95% stenosis of the mid LAD, 30% in-stent restenosis of the mid left circumflex with a second lesion of diffuse 50% stenosis. Underwent successful PCI/BMS to the mid LAD with 0% residual stenosis .   4: COPD- - wearing oxygen at 3L around the clock - continues to use inhalers - continues to smoke  but takes his oxygen off and doesn't smoke anywhere near the oxygen  5: Frequent PVC's/ PAC's- - keep K >4.0 - keep Mg >2.0  Return in 6 weeks, sooner if needed.

## 2023-03-24 LAB — BASIC METABOLIC PANEL
BUN/Creatinine Ratio: 13 (ref 10–24)
BUN: 17 mg/dL (ref 8–27)
CO2: 30 mmol/L — ABNORMAL HIGH (ref 20–29)
Calcium: 8.6 mg/dL (ref 8.6–10.2)
Chloride: 103 mmol/L (ref 96–106)
Creatinine, Ser: 1.29 mg/dL — ABNORMAL HIGH (ref 0.76–1.27)
Glucose: 97 mg/dL (ref 70–99)
Potassium: 4.3 mmol/L (ref 3.5–5.2)
Sodium: 147 mmol/L — ABNORMAL HIGH (ref 134–144)
eGFR: 56 mL/min/{1.73_m2} — ABNORMAL LOW (ref >59)

## 2023-03-29 ENCOUNTER — Ambulatory Visit: Payer: PPO | Admitting: Physician Assistant

## 2023-03-29 NOTE — Progress Notes (Unsigned)
Cardiology Office Note    Date:  03/29/2023   ID:  [name], DOB Jun 21, 1945, MRN [id number]  PCP:  Nathaniel Penton, MD  Cardiologist:  Nathaniel Nordmann, MD  Electrophysiologist:  None   Chief Complaint: Follow up  History of Present Illness:   [name] is a 77 y.o. male with history of CAD with history of posterior MI in 2002 status post BMS to the LCx status post PCI/BMS to the LAD in 2012, chronic combined systolic and diastolic CHF with ischemic cardiomyopathy, AAA status post endovascular repair in 12/2017 followed by vascular surgery, intraparenchymal hemorrhage in 02/2021, HTN, HLD, COPD, pulmonary hypertension, prostate cancer status post seed implantation, tobacco use, and nephrolithiasis who presents for follow-up of his CAD and cardiomyopathy.   He underwent Myoview in 2010 that was negative for ischemia with an EF of 60%.  Abnormal EKG was noted in the office in 2012 with follow-up nuclear stress testing showing anterior wall ischemia with an estimated EF of 26%.  This was a new wall motion abnormality as well as a newly reduced EF.  Follow-up cardiac cath on 01/05/2011 showed 95% stenosis of the proximal LAD, 95% stenosis of the mid LAD, 30% in-stent restenosis of the mid left circumflex with a second lesion of diffuse 50% stenosis.  He underwent successful PCI/BMS to the mid LAD with 0% residual stenosis.  Lexiscan MPI in 12/2017 showed a small defect of moderate severity present in the basal inferior and apex location with findings consistent with prior MI and an EF of 16%.  Overall, this was a high risk study.  Follow-up echo that month demonstrated an EF of 30 to 35%, moderately dilated LV cavity size, mild concentric LVH, grade 1 diastolic dysfunction, mild mitral regurgitation, mildly dilated left atrium, and normal PASP.  Subsequent echocardiograms over the years have demonstrated a persistent cardiomyopathy with patient declination for ICD.  He was admitted to the hospital  in 02/2021 with an intraparenchymal hemorrhage managed conservatively by neurosurgery.  He was admitted to the hospital in 04/2021 with acute on chronic combined CHF and elevated troponin peaking at 448, felt to be related to demand ischemia.  Echo in 04/2021 demonstrating an EF of 30 to 35% with global hypokinesis, mild, grade 1 diastolic dysfunction, normal RV systolic function with mildly enlarged cavity size, PASP 54.5 mmHg, mildly dilated left atrium, severely dilated right atrium and estimated right atrial pressure of 8 mmHg.  He was admitted to the hospital in 11/2022 with productive cough, dyspnea, and wheezing for 3 days prior to presentation.  He was hypoxic with a pulse ox of 80% on room air.  Chest x-ray showed pulmonary vascular congestion.  He was given IV Lasix.  Echo showed an EF of 40 to 45% with Hypokinesis.  This was an improvement when compared to echo from 04/2021.  He qualified for home oxygen.  He was readmitted in 01/2023 with CHF exacerbation, diuresed with IV Lasix.  He was started on Farxiga.  He followed up with the advanced heart failure service after discharge with stable weight at home.  He was taking furosemide 20 mg daily.  He was wearing supplemental oxygen.  He continued to smoke 2 to 3 cigarettes/day.  ***   Labs independently reviewed: 03/2023 - BUN 17, serum creatinine 1.29, potassium 4.3 01/2023 - Hgb 12.8, PLT 141, albumin 3.2, AST/ALT normal 12/2022 - TC 126, TG 80, HDL 42, LDL 67, TSH normal  Past Medical History:  Diagnosis Date  AAA (abdominal aortic aneurysm) (HCC)    a.  CTA 7/19: measured 4.5 x 5.3 cm and greatest transverse dimensions   Abnormal nuclear stress test    a.  Myoview 2012: anterior wall ischemia with an estimated EF of 26%.  This was a new wall motion abnormality as well as a newly reduced EF   Brain bleed (HCC)    COPD (chronic obstructive pulmonary disease) (HCC)    Coronary artery disease    a.  Posterior MI in 2002 status post BMS to the  LCx; b. LHC 2012: 95% stenosis of the proximal LAD, 95% stenosis of the mid LAD, 30% in-stent restenosis of the mid left circumflex with a second lesion of diffuse 50% stenosis.  The patient underwent successful PCI/BMS to the mid LAD with 0% residual stenosis, LV gram not performed   GERD (gastroesophageal reflux disease)    History of kidney stones    History of prostate cancer    Hyperlipidemia    Hypertension    Myocardial infarction [facility])    Neuromuscular disorder (HCC)    Prostate cancer (HCC)    a.  Status post seeding   PVD (peripheral vascular disease) (HCC)    Stroke [facility])     Past Surgical History:  Procedure Laterality Date   BACK SURGERY  2009/2010   x 2   BOWEL RESECTION N/A 03/18/2021   Procedure: SMALL BOWEL RESECTION;  Surgeon: Diamantina Monks, MD;  Location: [facility] OR;  Service: General;  Laterality: N/A;   CARDIAC CATHETERIZATION  2002   stent placement Reynolds   ENDOVASCULAR REPAIR/STENT GRAFT N/A 01/02/2018   Procedure: ENDOVASCULAR REPAIR/STENT GRAFT;  Surgeon: Annice Needy, MD;  Location: [facility] INVASIVE CV LAB;  Service: Cardiovascular;  Laterality: N/A;   FOOT SURGERY     bilateral    HERNIA REPAIR     bilateral    INGUINAL HERNIA REPAIR Left 03/18/2021   Procedure: INGUINAL HERNIA REPAIR;  Surgeon: Diamantina Monks, MD;  Location: [facility] OR;  Service: General;  Laterality: Left;   INSERTION OF MESH Left 03/18/2021   Procedure: INSERTION OF MESH;  Surgeon: Diamantina Monks, MD;  Location: [facility] OR;  Service: General;  Laterality: Left;   KNEE SURGERY     right knee    PROSTATE SURGERY  09/2009   prostate implant   SPINE SURGERY     UMBILICAL HERNIA REPAIR N/A 03/18/2021   Procedure: UMBILICAL HERNIA REPAIR;  Surgeon: Diamantina Monks, MD;  Location: [facility] OR;  Service: General;  Laterality: N/A;    Current Medications: No outpatient medications have been marked as taking for the 03/29/23 encounter (Appointment) with Sondra Barges, PA-C.    Allergies:   Lyrica  [pregabalin]   Social History   Socioeconomic History   Marital status: Married    Spouse name: Not on file   Number of children: Not on file   Years of education: Not on file   Highest education level: Not on file  Occupational History   Not on file  Tobacco Use   Smoking status: Every Day    Current packs/day: 0.25    Average packs/day: 0.3 packs/day for 56.0 years (14.0 ttl pk-yrs)    Types: Cigarettes   Smokeless tobacco: Never   Tobacco comments:    back to smoking X1 year  Vaping Use   Vaping status: Never Used  Substance and Sexual Activity   Alcohol use: No    Alcohol/week: 0.0 standard drinks of alcohol  Drug use: No   Sexual activity: Not Currently  Other Topics Concern   Not on file  Social History Narrative   Not on file   Social Drivers of Health   Financial Resource Strain: Low Risk  (01/03/2023)   Received from [facility]   Overall Financial Resource Strain (CARDIA)    Difficulty of Paying Living Expenses: Not hard at all  Food Insecurity: No Food Insecurity (01/17/2023)   Hunger Vital Sign    Worried About Running Out of Food in the Last Year: Never true    Ran Out of Food in the Last Year: Never true  Transportation Needs: No Transportation Needs (01/17/2023)   PRAPARE - Administrator, Civil Service (Medical): No    Lack of Transportation (Non-Medical): No  Physical Activity: Not on file  Stress: No Stress Concern Present (08/21/2018)   [employer] of Occupational Health - Occupational Stress Questionnaire    Feeling of Stress : Not at all  Social Connections: Not on file     Family History:  The patient's family history includes Diabetes in his mother; Heart attack in his mother; Heart disease in his father; Lung cancer in his daughter.  ROS:   12-point review of systems is negative unless otherwise noted in the HPI.   EKGs/Labs/Other Studies Reviewed:    Studies reviewed were summarized above. The  additional studies were reviewed today:  2D echo 12/12/2022: 1. Left ventricular ejection fraction, by estimation, is 40 to 45%. The  left ventricle has mildly decreased function. The left ventricle  demonstrates global hypokinesis. There is moderate left ventricular  hypertrophy. Left ventricular diastolic  parameters are consistent with Grade I diastolic dysfunction (impaired  relaxation).   2. Right ventricular systolic function is normal. The right ventricular  size is mildly enlarged. There is normal pulmonary artery systolic  pressure.   3. Left atrial size was mildly dilated.   4. Right atrial size was mildly dilated.   5. The mitral valve is grossly normal. Mild mitral valve regurgitation.  No evidence of mitral stenosis.   6. The aortic valve was not well visualized. Aortic valve regurgitation  is not visualized. No aortic stenosis is present.   7. Pulmonic valve regurgitation not well-assessed.   8. The inferior vena cava is normal in size with greater than 50%  respiratory variability, suggesting right atrial pressure of 3 mmHg.  __________  2D echo 04/25/2021: 1. Left ventricular ejection fraction, by estimation, is 30 to 35%. The  left ventricle has moderately decreased function. The left ventricle  demonstrates global hypokinesis. There is mild left ventricular  hypertrophy. Left ventricular diastolic  parameters are consistent with Grade I diastolic dysfunction (impaired  relaxation).   2. Right ventricular systolic function is normal. The right ventricular  size is mildly enlarged. There is moderately elevated pulmonary artery  systolic pressure. The estimated right ventricular systolic pressure is  54.5 mmHg.   3. Left atrial size was mildly dilated.   4. Right atrial size was severely dilated.   5. The mitral valve is normal in structure. Mild mitral valve  regurgitation.   6. The aortic valve was not well visualized. Aortic valve regurgitation  is not  visualized. No aortic stenosis is present.   7. The inferior vena cava is dilated in size with >50% respiratory  variability, suggesting right atrial pressure of 8 mmHg. __________   2D echo 02/21/2021: 1. Left ventricular ejection fraction, by estimation, is 40 to 45%. Left  ventricular ejection fraction by 2D MOD biplane is 44.2 %. The left  ventricle has mild to moderately decreased function. The left ventricle  demonstrates global hypokinesis. There  is mild left ventricular hypertrophy of the basal-septal segment. Left  ventricular diastolic parameters are consistent with Grade I diastolic  dysfunction (impaired relaxation).   2. Right ventricular systolic function is normal. The right ventricular  size is normal.   3. The mitral valve is normal in structure. No evidence of mitral valve  regurgitation.   4. The aortic valve was not well visualized. Aortic valve regurgitation  is not visualized.   5. The inferior vena cava is normal in size with <50% respiratory  variability, suggesting right atrial pressure of 8 mmHg. __________   2D echo 06/24/2020: 1. Left ventricular ejection fraction, by estimation, is 30 to 35%. The  left ventricle has moderate to severely decreased function. The left  ventricle demonstrates global hypokinesis. The left ventricular internal  cavity size was mildly dilated. Left  ventricular diastolic parameters are consistent with Grade I diastolic  dysfunction (impaired relaxation).   2. Right ventricular systolic function is low normal. The right  ventricular size is normal.   3. The mitral valve is normal in structure. No evidence of mitral valve  regurgitation.   4. The aortic valve is grossly normal. Aortic valve regurgitation is  trivial. __________   2D echo 10/25/2018: 1. Severe hypokinesis of the left ventricular, mid-apical anteroseptal  wall, anterior wall, inferoseptal wall and apical segment.   2. The left ventricle has moderate-severely  reduced systolic function,  with an ejection fraction of 30-35%. The cavity size was moderately  dilated. There is mild concentric left ventricular hypertrophy. Left  ventricular diastolic Doppler parameters are  consistent with impaired relaxation.   3. The right ventricle has normal systolic function. The cavity was  normal. There is no increase in right ventricular wall thickness.   4. Left atrial size was mildly dilated.   5. Right atrial size was mildly dilated.   6. The mitral valve is grossly normal.   7. The tricuspid valve is grossly normal.   8. The aortic valve is grossly normal. Aortic valve regurgitation is  trivial by color flow Doppler. No stenosis of the aortic valve. __________   2D echo 12/25/2017: - Left ventricle: The cavity size was moderately dilated. There was    mild concentric hypertrophy. Systolic function was moderately to    severely reduced. The estimated ejection fraction was in the    range of 30% to 35%. Wall motion was normal; there were no    regional wall motion abnormalities. Doppler parameters are    consistent with abnormal left ventricular relaxation (grade 1    diastolic dysfunction).  - Aorta: Aortic root dimension: 39 mm (ED).  - Mitral valve: There was mild regurgitation.  - Left atrium: The atrium was mildly dilated.  - Pulmonary arteries: Systolic pressure was within the normal    range. __________   Eugenie Birks MPI 12/21/2017: T wave inversion was noted during stress in the V5 and V6 leads. There was no ST segment deviation noted during stress. Defect 1: There is a small defect of moderate severity present in the basal inferior and apex location. Findings consistent with prior myocardial infarction. This is a high risk study. Nuclear stress EF: 16%.   EKG:  EKG is ordered today.  The EKG ordered today demonstrates ***  Recent Labs: 01/16/2023: ALT 26 01/17/2023: Hemoglobin 12.8; Platelets 141 01/23/2023: BNP 663.3  03/23/2023: BUN 17;  Creatinine, Ser 1.29; Potassium 4.3; Sodium 147  Recent Lipid Panel    Component Value Date/Time   CHOL 110 04/22/2018 0801   CHOL 154 03/04/2013 0831   TRIG 81 02/22/2021 0546   HDL 37 (L) 04/22/2018 0801   HDL 39 (L) 03/04/2013 0831   CHOLHDL 3.0 04/22/2018 0801   VLDL 11 04/22/2018 0801   LDLCALC 62 04/22/2018 0801   LDLCALC 88 03/04/2013 0831    PHYSICAL EXAM:    VS:  There were no vitals taken for this visit.  BMI: There is no height or weight on file to calculate BMI.  Physical Exam  Wt Readings from Last 3 Encounters:  03/23/23 181 lb 2 oz (82.2 kg)  01/23/23 183 lb (83 kg)  01/16/23 181 lb 7 oz (82.3 kg)     ASSESSMENT & PLAN:   HFmrEF:  CAD native coronary arteries with ***:  HTN: Blood pressure  HLD: LDL 67 in 12/2022.  AAA: Status post endovascular repair in 12/2017.  Has not followed up with vascular surgery with most recent follow-up ultrasound in 09/2019 demonstrating patent endovascular aneurysm repair with no evidence of endoleak.  ***  Chronic hypoxic respiratory pain with COPD and ongoing tobacco use:   {Are you ordering a CV Procedure (e.g. stress test, cath, DCCV, TEE, etc)?   Press F2        :[id number]}     Disposition: F/u with Dr. Mariah Milling or an APP in ***.   Medication Adjustments/Labs and Tests Ordered: Current medicines are reviewed at length with the patient today.  Concerns regarding medicines are outlined above. Medication changes, Labs and Tests ordered today are summarized above and listed in the Patient Instructions accessible in Encounters.   Signed, Eula Listen, PA-C 03/29/2023 9:48 AM     [facility] [place] [address] [place], [place] [address] [phone]

## 2023-04-08 NOTE — Progress Notes (Signed)
Cardiology Office Note    Date:  04/13/2023   ID:  [name], DOB 02/07/1946, MRN [id number]  PCP:  Danella Penton, MD  Cardiologist:  Julien Nordmann, MD  Electrophysiologist:  None   Chief Complaint: Follow up  History of Present Illness:   [name] is a 77 y.o. male with history of CAD with history of posterior MI in 2002 status post BMS to the LCx status post PCI/BMS to the LAD in 2012, chronic combined systolic and diastolic CHF with ischemic cardiomyopathy, AAA status post endovascular repair in 12/2017 followed by vascular surgery, intraparenchymal hemorrhage in 02/2021, HTN, HLD, COPD, pulmonary hypertension, prostate cancer status post seed implantation, tobacco use, and nephrolithiasis who presents for follow-up of his CAD and cardiomyopathy.   He underwent Myoview in 2010 that was negative for ischemia with an EF of 60%.  Abnormal EKG was noted in the office in 2012 with follow-up nuclear stress testing showing anterior wall ischemia with an estimated EF of 26%.  This was a new wall motion abnormality as well as a newly reduced EF.  Follow-up cardiac cath on 01/05/2011 showed 95% stenosis of the proximal LAD, 95% stenosis of the mid LAD, 30% in-stent restenosis of the mid left circumflex with a second lesion of diffuse 50% stenosis.  He underwent successful PCI/BMS to the mid LAD with 0% residual stenosis.  Lexiscan MPI in 12/2017 showed a small defect of moderate severity present in the basal inferior and apex location with findings consistent with prior MI and an EF of 16%.  Overall, this was a high risk study.  Follow-up echo that month demonstrated an EF of 30 to 35%, moderately dilated LV cavity size, mild concentric LVH, grade 1 diastolic dysfunction, mild mitral regurgitation, mildly dilated left atrium, and normal PASP.  Subsequent echocardiograms over the years have demonstrated a persistent cardiomyopathy with patient declination for ICD.  He was admitted to the hospital  in 02/2021 with an intraparenchymal hemorrhage managed conservatively by neurosurgery.  He was admitted to the hospital in 04/2021 with acute on chronic combined CHF and elevated troponin peaking at 448, felt to be related to demand ischemia.  Echo in 04/2021 demonstrated an EF of 30 to 35% with global hypokinesis, mild, grade 1 diastolic dysfunction, normal RV systolic function with mildly enlarged cavity size, PASP 54.5 mmHg, mildly dilated left atrium, severely dilated right atrium and estimated right atrial pressure of 8 mmHg.  More recently, he was admitted to the hospital in 11/2022 with productive cough, dyspnea, and wheezing for 3 days prior to presentation.  He was hypoxic with a pulse ox of 80% on room air.  Chest x-ray showed pulmonary vascular congestion.  He was given IV Lasix.  Echo showed an EF of 40 to 45% with Hypokinesis.  This was an improvement when compared to echo from 04/2021.  He qualified for home oxygen.  He was readmitted in 01/2023 with CHF exacerbation, diuresed with IV Lasix.  He was started on Farxiga.  He followed up with the advanced heart failure service after discharge with stable weight at home.  He was taking furosemide 20 mg daily.  He was wearing supplemental oxygen.  He continued to smoke 2 to 3 cigarettes/day.  He comes in today and is without symptoms of angina or cardiac decompensation.  He reports his chronic dyspnea is stable, on baseline 3 L supplemental oxygen via nasal cannula.  Weight trend reviewed from August shows a weight trend of 179 to 174  pounds over the month of December.  He reports a good appetite, has.  Continues to enjoy sweets.  No lower extremity swelling, abdominal distention, or orthopnea.  No longer on bisoprolol, indicating this was held due to bradycardia and hypotension in the fall.  No longer drinking multiple Mountain Dew's per day, drinking less than 2 L of liquids daily.  Has increased his water intake.  Not adding salt to food.  Overall, feels  stable from a cardiac perspective.   Labs independently reviewed: 03/2023 - BUN 17, serum creatinine 1.29, potassium 4.3 01/2023 - Hgb 12.8, PLT 141, albumin 3.2, AST/ALT normal 12/2022 - TC 126, TG 80, HDL 42, LDL 67, TSH normal  Past Medical History:  Diagnosis Date   AAA (abdominal aortic aneurysm) (HCC)    a.  CTA 7/19: measured 4.5 x 5.3 cm and greatest transverse dimensions   Abnormal nuclear stress test    a.  Myoview 2012: anterior wall ischemia with an estimated EF of 26%.  This was a new wall motion abnormality as well as a newly reduced EF   Brain bleed (HCC)    COPD (chronic obstructive pulmonary disease) (HCC)    Coronary artery disease    a.  Posterior MI in 2002 status post BMS to the LCx; b. LHC 2012: 95% stenosis of the proximal LAD, 95% stenosis of the mid LAD, 30% in-stent restenosis of the mid left circumflex with a second lesion of diffuse 50% stenosis.  The patient underwent successful PCI/BMS to the mid LAD with 0% residual stenosis, LV gram not performed   GERD (gastroesophageal reflux disease)    History of kidney stones    History of prostate cancer    Hyperlipidemia    Hypertension    Myocardial infarction [facility])    Neuromuscular disorder (HCC)    Prostate cancer (HCC)    a.  Status post seeding   PVD (peripheral vascular disease) (HCC)    Stroke [facility])     Past Surgical History:  Procedure Laterality Date   BACK SURGERY  2009/2010   x 2   BOWEL RESECTION N/A 03/18/2021   Procedure: SMALL BOWEL RESECTION;  Surgeon: Diamantina Monks, MD;  Location: [facility] OR;  Service: General;  Laterality: N/A;   CARDIAC CATHETERIZATION  2002   stent placement [place]   ENDOVASCULAR REPAIR/STENT GRAFT N/A 01/02/2018   Procedure: ENDOVASCULAR REPAIR/STENT GRAFT;  Surgeon: Annice Needy, MD;  Location: [facility] INVASIVE CV LAB;  Service: Cardiovascular;  Laterality: N/A;   FOOT SURGERY     bilateral    HERNIA REPAIR     bilateral    INGUINAL HERNIA REPAIR Left 03/18/2021    Procedure: INGUINAL HERNIA REPAIR;  Surgeon: Diamantina Monks, MD;  Location: [facility] OR;  Service: General;  Laterality: Left;   INSERTION OF MESH Left 03/18/2021   Procedure: INSERTION OF MESH;  Surgeon: Diamantina Monks, MD;  Location: [facility] OR;  Service: General;  Laterality: Left;   KNEE SURGERY     right knee    PROSTATE SURGERY  09/2009   prostate implant   SPINE SURGERY     UMBILICAL HERNIA REPAIR N/A 03/18/2021   Procedure: UMBILICAL HERNIA REPAIR;  Surgeon: Diamantina Monks, MD;  Location: [facility] OR;  Service: General;  Laterality: N/A;    Current Medications: Current Meds  Medication Sig   acetaminophen (TYLENOL) 325 MG tablet Take 2 tablets (650 mg total) by mouth every 6 (six) hours as needed for mild pain (or Fever >/= 101).  albuterol (PROVENTIL) (2.5 MG/3ML) 0.083% nebulizer solution Take 2.5 mg by nebulization every 6 (six) hours as needed.   albuterol (VENTOLIN HFA) 108 (90 Base) MCG/ACT inhaler Inhale 2 puffs into the lungs every 6 (six) hours as needed for wheezing.   aspirin EC 81 MG tablet Take 81 mg by mouth daily.   atorvastatin (LIPITOR) 80 MG tablet Take 1 tablet (80 mg total) by mouth daily.   dapagliflozin propanediol (FARXIGA) 10 MG TABS tablet Take 1 tablet (10 mg total) by mouth daily.   furosemide (LASIX) 20 MG tablet Take 1 tablet (20 mg total) by mouth daily. Take 40 mg (2 tablets) on Monday and Friday only. Take Lasix 20 mg (1 tablet) on the other days.   mirtazapine (REMERON) 7.5 MG tablet Take 7.5 mg by mouth at bedtime.   Multiple Vitamins-Minerals (MULTIVITAMIN WITH MINERALS) tablet Take 1 tablet by mouth daily.   omeprazole (PRILOSEC) 40 MG capsule TAKE 1 CAPSULE BY MOUTH DAILY USUALLY BEFORE BREAKFAST   sacubitril-valsartan (ENTRESTO) 24-26 MG Take 1 tablet by mouth 2 (two) times daily.   spironolactone (ALDACTONE) 25 MG tablet Take 0.5 tablets (12.5 mg total) by mouth daily. FOR CHRONIC COMBINED CHF    Allergies:   Lyrica [pregabalin]   Social  History   Socioeconomic History   Marital status: Married    Spouse name: Not on file   Number of children: Not on file   Years of education: Not on file   Highest education level: Not on file  Occupational History   Not on file  Tobacco Use   Smoking status: Every Day    Current packs/day: 0.25    Average packs/day: 0.3 packs/day for 56.0 years (14.0 ttl pk-yrs)    Types: Cigarettes   Smokeless tobacco: Never   Tobacco comments:    back to smoking X1 year  Vaping Use   Vaping status: Never Used  Substance and Sexual Activity   Alcohol use: No    Alcohol/week: 0.0 standard drinks of alcohol   Drug use: No   Sexual activity: Not Currently  Other Topics Concern   Not on file  Social History Narrative   Not on file   Social Drivers of Health   Financial Resource Strain: Low Risk  (01/03/2023)   Received [facility]   Overall Financial Resource Strain (CARDIA)    Difficulty of Paying Living Expenses: Not hard at all  Food Insecurity: No Food Insecurity (01/17/2023)   Hunger Vital Sign    Worried About Running Out of Food in the Last Year: Never true    Ran Out of Food in the Last Year: Never true  Transportation Needs: No Transportation Needs (01/17/2023)   PRAPARE - Administrator, Civil Service (Medical): No    Lack of Transportation (Non-Medical): No  Physical Activity: Not on file  Stress: No Stress Concern Present (08/21/2018)   [employer] of Occupational Health - Occupational Stress Questionnaire    Feeling of Stress : Not at all  Social Connections: Not on file     Family History:  The patient's family history includes Diabetes in his mother; Heart attack in his mother; Heart disease in his father; Lung cancer in his daughter.  ROS:   12-point review of systems is negative unless otherwise noted in the HPI.   EKGs/Labs/Other Studies Reviewed:    Studies reviewed were summarized above. The additional studies were  reviewed today:  2D echo 12/12/2022: 1. Left ventricular ejection fraction,  by estimation, is 40 to 45%. The  left ventricle has mildly decreased function. The left ventricle  demonstrates global hypokinesis. There is moderate left ventricular  hypertrophy. Left ventricular diastolic  parameters are consistent with Grade I diastolic dysfunction (impaired  relaxation).   2. Right ventricular systolic function is normal. The right ventricular  size is mildly enlarged. There is normal pulmonary artery systolic  pressure.   3. Left atrial size was mildly dilated.   4. Right atrial size was mildly dilated.   5. The mitral valve is grossly normal. Mild mitral valve regurgitation.  No evidence of mitral stenosis.   6. The aortic valve was not well visualized. Aortic valve regurgitation  is not visualized. No aortic stenosis is present.   7. Pulmonic valve regurgitation not well-assessed.   8. The inferior vena cava is normal in size with greater than 50%  respiratory variability, suggesting right atrial pressure of 3 mmHg.  __________  2D echo 04/25/2021: 1. Left ventricular ejection fraction, by estimation, is 30 to 35%. The  left ventricle has moderately decreased function. The left ventricle  demonstrates global hypokinesis. There is mild left ventricular  hypertrophy. Left ventricular diastolic  parameters are consistent with Grade I diastolic dysfunction (impaired  relaxation).   2. Right ventricular systolic function is normal. The right ventricular  size is mildly enlarged. There is moderately elevated pulmonary artery  systolic pressure. The estimated right ventricular systolic pressure is  54.5 mmHg.   3. Left atrial size was mildly dilated.   4. Right atrial size was severely dilated.   5. The mitral valve is normal in structure. Mild mitral valve  regurgitation.   6. The aortic valve was not well visualized. Aortic valve regurgitation  is not visualized. No aortic stenosis is  present.   7. The inferior vena cava is dilated in size with >50% respiratory  variability, suggesting right atrial pressure of 8 mmHg. __________   2D echo 02/21/2021: 1. Left ventricular ejection fraction, by estimation, is 40 to 45%. Left  ventricular ejection fraction by 2D MOD biplane is 44.2 %. The left  ventricle has mild to moderately decreased function. The left ventricle  demonstrates global hypokinesis. There  is mild left ventricular hypertrophy of the basal-septal segment. Left  ventricular diastolic parameters are consistent with Grade I diastolic  dysfunction (impaired relaxation).   2. Right ventricular systolic function is normal. The right ventricular  size is normal.   3. The mitral valve is normal in structure. No evidence of mitral valve  regurgitation.   4. The aortic valve was not well visualized. Aortic valve regurgitation  is not visualized.   5. The inferior vena cava is normal in size with <50% respiratory  variability, suggesting right atrial pressure of 8 mmHg. __________   2D echo 06/24/2020: 1. Left ventricular ejection fraction, by estimation, is 30 to 35%. The  left ventricle has moderate to severely decreased function. The left  ventricle demonstrates global hypokinesis. The left ventricular internal  cavity size was mildly dilated. Left  ventricular diastolic parameters are consistent with Grade I diastolic  dysfunction (impaired relaxation).   2. Right ventricular systolic function is low normal. The right  ventricular size is normal.   3. The mitral valve is normal in structure. No evidence of mitral valve  regurgitation.   4. The aortic valve is grossly normal. Aortic valve regurgitation is  trivial. __________   2D echo 10/25/2018: 1. Severe hypokinesis of the left ventricular, mid-apical anteroseptal  wall, anterior  wall, inferoseptal wall and apical segment.   2. The left ventricle has moderate-severely reduced systolic function,  with  an ejection fraction of 30-35%. The cavity size was moderately  dilated. There is mild concentric left ventricular hypertrophy. Left  ventricular diastolic Doppler parameters are  consistent with impaired relaxation.   3. The right ventricle has normal systolic function. The cavity was  normal. There is no increase in right ventricular wall thickness.   4. Left atrial size was mildly dilated.   5. Right atrial size was mildly dilated.   6. The mitral valve is grossly normal.   7. The tricuspid valve is grossly normal.   8. The aortic valve is grossly normal. Aortic valve regurgitation is  trivial by color flow Doppler. No stenosis of the aortic valve. __________   2D echo 12/25/2017: - Left ventricle: The cavity size was moderately dilated. There was    mild concentric hypertrophy. Systolic function was moderately to    severely reduced. The estimated ejection fraction was in the    range of 30% to 35%. Wall motion was normal; there were no    regional wall motion abnormalities. Doppler parameters are    consistent with abnormal left ventricular relaxation (grade 1    diastolic dysfunction).  - Aorta: Aortic root dimension: 39 mm (ED).  - Mitral valve: There was mild regurgitation.  - Left atrium: The atrium was mildly dilated.  - Pulmonary arteries: Systolic pressure was within the normal    range. __________   Eugenie Birks MPI 12/21/2017: T wave inversion was noted during stress in the V5 and V6 leads. There was no ST segment deviation noted during stress. Defect 1: There is a small defect of moderate severity present in the basal inferior and apex location. Findings consistent with prior myocardial infarction. This is a high risk study. Nuclear stress EF: 16%.   EKG:  EKG is ordered today.  The EKG ordered today demonstrates NSR, 80 bpm, occasional PACs/fusion complexes, baseline wandering and artifact, lateral TWI consistent with prior tracing (12/2021)  Recent Labs: 01/16/2023:  ALT 26 01/17/2023: Hemoglobin 12.8; Platelets 141 01/23/2023: BNP 663.3 03/23/2023: BUN 17; Creatinine, Ser 1.29; Potassium 4.3; Sodium 147  Recent Lipid Panel    Component Value Date/Time   CHOL 110 04/22/2018 0801   CHOL 154 03/04/2013 0831   TRIG 81 02/22/2021 0546   HDL 37 (L) 04/22/2018 0801   HDL 39 (L) 03/04/2013 0831   CHOLHDL 3.0 04/22/2018 0801   VLDL 11 04/22/2018 0801   LDLCALC 62 04/22/2018 0801   LDLCALC 88 03/04/2013 0831    PHYSICAL EXAM:    VS:  BP 108/68 (BP Location: Left Arm, Patient Position: Sitting, Cuff Size: Normal)   Pulse 80   Ht 6' (1.829 m)   Wt 175 lb (79.4 kg)   SpO2 92%   BMI 23.73 kg/m   BMI: Body mass index is 23.73 kg/m.  Physical Exam Vitals reviewed.  Constitutional:      Appearance: He is well-developed.  HENT:     Head: Normocephalic and atraumatic.  Eyes:     General:        Right eye: No discharge.        Left eye: No discharge.  Neck:     Vascular: No JVD.  Cardiovascular:     Rate and Rhythm: Normal rate and regular rhythm.     Heart sounds: Normal heart sounds, S1 normal and S2 normal. Heart sounds not distant. No midsystolic click and no opening  snap. No murmur heard.    No friction rub.  Pulmonary:     Effort: Pulmonary effort is normal. No respiratory distress.     Breath sounds: Decreased breath sounds present. No wheezing, rhonchi or rales.  Chest:     Chest wall: No tenderness.  Abdominal:     General: There is no distension.  Musculoskeletal:     Cervical back: Normal range of motion.     Right lower leg: No edema.     Left lower leg: No edema.  Skin:    General: Skin is warm and dry.     Nails: There is no clubbing.  Neurological:     Mental Status: He is alert and oriented to person, place, and time.  Psychiatric:        Speech: Speech normal.        Behavior: Behavior normal.        Thought Content: Thought content normal.        Judgment: Judgment normal.     Wt Readings from Last 3 Encounters:   04/13/23 175 lb (79.4 kg)  03/23/23 181 lb 2 oz (82.2 kg)  01/23/23 183 lb (83 kg)     ASSESSMENT & PLAN:   HFmrEF: Likely ischemic in etiology.  Symptoms of dyspnea are likely multifactorial including cardiomyopathy, COPD with ongoing tobacco use, and deconditioning.  NYHA class III symptoms.  Relative hypotension has precluded the escalation of GDMT, and more recently led to the discontinuation of bisoprolol due to bradycardia and hypotension.  Continue Entresto 24/26 mg twice daily, spironolactone 12.5 mg daily and Farxiga 10 mg daily.  For now, he will remain on furosemide 40 mg Mondays and Fridays with 20 mg every other day pending BMP.  If follow-up labs today demonstrate improved renal function would continue current dose of furosemide.  If AKI persists or is worse, would be escalated furosemide to 20 mg daily with an additional 20 mg as needed for weight gain greater than 3 pounds overnight or 5 pounds in a 1 week timeframe.  This was discussed with patient and family present in the visit today.  As able, look to rechallenge with beta-blocker.  CAD native coronary arteries without angina: He is without symptoms of angina or cardiac decompensation.  Continue aggressive risk factor modification and secondary prevention including aspirin 81 mg and Lipitor 80 mg.  No indication for ischemic testing at this time.    HTN: Blood pressure is well-controlled in the office today.  Historically, relative hypotension has limited the escalation of GDMT.  Continue medical therapy as outlined above.  HLD: LDL 67 in 12/2022.  He remains on atorvastatin 80 mg.  AAA: Status post endovascular repair in 12/2017.  Follow-up with vascular surgery as directed.  Chronic hypoxic respiratory failure with COPD and ongoing tobacco use: Stable on 3L supplemental oxygen at baseline.  Follow-up with PCP and pulmonology as directed.  Complete cessation of tobacco use is recommended.  AKI: Check BMP.    Disposition:  F/u with Dr. Mariah Milling or an APP in 6 months.   Medication Adjustments/Labs and Tests Ordered: Current medicines are reviewed at length with the patient today.  Concerns regarding medicines are outlined above. Medication changes, Labs and Tests ordered today are summarized above and listed in the Patient Instructions accessible in Encounters.   Signed, Eula Listen, PA-C 04/13/2023 4:10 PM     [facility] [place] [address] [place], [place] [address] [phone]

## 2023-04-13 ENCOUNTER — Ambulatory Visit: Payer: PPO | Attending: Physician Assistant | Admitting: Physician Assistant

## 2023-04-13 ENCOUNTER — Encounter: Payer: Self-pay | Admitting: Physician Assistant

## 2023-04-13 VITALS — BP 108/68 | HR 80 | Ht 72.0 in | Wt 175.0 lb

## 2023-04-13 DIAGNOSIS — Z9981 Dependence on supplemental oxygen: Secondary | ICD-10-CM | POA: Diagnosis not present

## 2023-04-13 DIAGNOSIS — N179 Acute kidney failure, unspecified: Secondary | ICD-10-CM

## 2023-04-13 DIAGNOSIS — I10 Essential (primary) hypertension: Secondary | ICD-10-CM

## 2023-04-13 DIAGNOSIS — J9611 Chronic respiratory failure with hypoxia: Secondary | ICD-10-CM

## 2023-04-13 DIAGNOSIS — I2510 Atherosclerotic heart disease of native coronary artery without angina pectoris: Secondary | ICD-10-CM | POA: Diagnosis not present

## 2023-04-13 DIAGNOSIS — I5022 Chronic systolic (congestive) heart failure: Secondary | ICD-10-CM

## 2023-04-13 DIAGNOSIS — J449 Chronic obstructive pulmonary disease, unspecified: Secondary | ICD-10-CM | POA: Diagnosis not present

## 2023-04-13 NOTE — Patient Instructions (Signed)
Medication Instructions:  Your Physician recommend you continue on your current medication as directed.    *If you need a refill on your cardiac medications before your next appointment, please call your pharmacy*   Lab Work: Your provider would like for you to have following labs drawn today BMET.   If you have labs (blood work) drawn today and your tests are completely normal, you will receive your results only by: MyChart Message (if you have MyChart) OR A paper copy in the mail If you have any lab test that is abnormal or we need to change your treatment, we will call you to review the results.   Testing/Procedures: None ordered at this time    Follow-Up: At [facility], you and your health needs are our priority.  As part of our continuing mission to provide you with exceptional heart care, we have created designated Provider Care Teams.  These Care Teams include your primary Cardiologist (physician) and Advanced Practice Providers (APPs -  Physician Assistants and Nurse Practitioners) who all work together to provide you with the care you need, when you need it.  We recommend signing up for the patient portal called "MyChart".  Sign up information is provided on this After Visit Summary.  MyChart is used to connect with patients for Virtual Visits (Telemedicine).  Patients are able to view lab/test results, encounter notes, upcoming appointments, etc.  Non-urgent messages can be sent to your provider as well.   To learn more about what you can do with MyChart, go to [email or web address].    Your next appointment:   6 month(s)  Provider:   You may see Julien Nordmann, MD or one of the following Advanced Practice Providers on your designated Care Team:   Eula Listen, [place]

## 2023-04-14 LAB — BASIC METABOLIC PANEL
BUN/Creatinine Ratio: 13 (ref 10–24)
BUN: 15 mg/dL (ref 8–27)
CO2: 32 mmol/L — ABNORMAL HIGH (ref 20–29)
Calcium: 9 mg/dL (ref 8.6–10.2)
Chloride: 101 mmol/L (ref 96–106)
Creatinine, Ser: 1.12 mg/dL (ref 0.76–1.27)
Glucose: 100 mg/dL — ABNORMAL HIGH (ref 70–99)
Potassium: 4 mmol/L (ref 3.5–5.2)
Sodium: 145 mmol/L — ABNORMAL HIGH (ref 134–144)
eGFR: 67 mL/min/{1.73_m2} (ref >59)

## 2023-04-23 DIAGNOSIS — J449 Chronic obstructive pulmonary disease, unspecified: Secondary | ICD-10-CM | POA: Diagnosis not present

## 2023-04-23 DIAGNOSIS — I5040 Unspecified combined systolic (congestive) and diastolic (congestive) heart failure: Secondary | ICD-10-CM | POA: Diagnosis not present

## 2023-05-11 ENCOUNTER — Encounter: Payer: PPO | Admitting: Family

## 2023-05-24 DIAGNOSIS — J449 Chronic obstructive pulmonary disease, unspecified: Secondary | ICD-10-CM | POA: Diagnosis not present

## 2023-05-24 DIAGNOSIS — I5040 Unspecified combined systolic (congestive) and diastolic (congestive) heart failure: Secondary | ICD-10-CM | POA: Diagnosis not present

## 2023-06-21 DIAGNOSIS — J449 Chronic obstructive pulmonary disease, unspecified: Secondary | ICD-10-CM | POA: Diagnosis not present

## 2023-06-21 DIAGNOSIS — I5040 Unspecified combined systolic (congestive) and diastolic (congestive) heart failure: Secondary | ICD-10-CM | POA: Diagnosis not present

## 2023-06-22 ENCOUNTER — Encounter: Payer: PPO | Admitting: Family

## 2023-06-27 ENCOUNTER — Telehealth: Payer: Self-pay | Admitting: Family

## 2023-06-27 NOTE — Telephone Encounter (Signed)
 Pt confirmed appt on 06/28/23

## 2023-06-27 NOTE — Progress Notes (Unsigned)
 Advanced Heart Failure Clinic Note    PCP: Danella Penton, MD (last seen 09/24) Cardiologist: Julien Nordmann, MD / Eula Listen, PA (last seen 12/24; returns 06/25)  Chief Complaint: shortness of breath with exertion  HPI:  [name] is a 78 y/o male with a history of severe COPD, anxiety, stroke, neuromuscular disorder,depression, HTN, prostate cancer s/p seed implantation, hyperlipidemia, lumbar disc disease, CAD with history of posterior MI in 2002 status post BMS to the LCx status post PCI/BMS to the LAD in 2012, AAA status post endovascular repair in 12/2017 followed by vascular surgery, intraparenchymal hemorrhage in 02/2021, pulmonary HTN, nephrolithiasis, GERD, tobacco use and chronic ischemic heart failure.   He underwent Myoview in 2010 that was negative for ischemia with an EF of 60%. In 2012, nuclear stress testing showed anterior wall ischemia with an estimated EF of 26%. Follow-up cardiac cath on 01/05/2011 showed 95% stenosis of the proximal LAD, 95% stenosis of the mid LAD, 30% in-stent restenosis of the mid left circumflex with a second lesion of diffuse 50% stenosis.  The patient underwent successful PCI/BMS to the mid LAD with 0% residual stenosis   Admitted to the hospital in 04/2021 with acute on chronic combined CHF and elevated troponin peaking at 448, felt to be related to demand ischemia.  Was improved with IV diuresis. Echo in 04/2021 demonstrating an EF of 30 to 35% with global hypokinesis, mild, grade 1 diastolic dysfunction, normal RV systolic function with mildly enlarged cavity size, PASP 54.5 mmHg, mildly dilated left atrium, severely dilated right atrium and estimated right atrial pressure of 8 mmHg.   Admitted 12/12/22 due to new onset of productive cough, shortness of breath and wheezing for 3 days prior to presentation. In the ED patient was hypoxic with pulse ox of 80% on room air. Chest x-ray showed pulmonary vascular congestion. Given IV lasix. Echocardiogram  this admission shows LV ejection fraction of 40 to 45% with global hypokinesis. Improvement in ejection fraction compared to echo from 04/2021. Qualified for home oxygen.    Seen in AHF clinic 9/324, his BP in his left arm read with systolics in the 50s. BP on the right with systolic in the 80s. His furosemide was held and GDMT decreased.     AHF clinic 12/21/22 labs showed possible over diuresis and SBP was soft. Symptoms suspected to be more due his advanced COPD that HF, switched lasix to PRN.    Echo 12/12/22: EF 40-45% with moderate LVH, Grade I DD, normal PA Pressure with mild Nathaniel   Admitted 01/16/23 with HF flare. Diuresed with IV lasix. Farxiga added. No discharge weight recorded.   He presents today for a HF follow-up visit with a chief complaint of shortness of breath with exertion which is chronic in nature. Has associated fatigue, cough, feeling of being off balance at times and chronic difficulty sleeping along with this. Denies chest pain, palpitations, abdominal distention, pedal edema, dizziness or weight gain. Wearing oxygen at 4L around the clock. Weight stable at home  He says that he's having trouble sleeping at night and tends to nap or doze off during the day because he's so tired. Denies that his difficulty sleeping has to do with his shortness of breath. Has upcoming PCP appt next week and will discuss this with him further. Had lab work done this morning for that upcoming appointment.   Previous cardiac studies:  Echo 12/25/17: EF 30-35% grade 1 diastolic dysfunction, mildly dilated aortic root at 39 mm, mild  mitral regurgitation, mildly dilated left atrium and normal pulmonary arterial pressure. Echo 10/25/18: EF 30-35% with mild LVH Echo 06/24/20: EF 30-35% with Grade 1 DD Echo 02/21/21: EF 40-45% with mild LVH, Grade I DD Echo 04/25/21: EF 30-35% with Grade I DD, mild LVH, moderately elevated PA pressure of 54.5 mmHg, mild Nathaniel Echo 12/12/22: EF 40-45% with moderate LVH, Grade I DD,  normal PA Pressure with mild Nathaniel  LHC 01/05/11: 95% stenosis of the proximal LAD, 95% stenosis of the mid LAD, 30% in-stent restenosis of the mid left circumflex with a second lesion of diffuse 50% stenosis. Underwent successful PCI/BMS to the mid LAD with 0% residual stenosis .   ROS: All systems negative except as listed in HPI, PMH and Problem List.  SH:  Social History   Socioeconomic History   Marital status: Married    Spouse name: Not on file   Number of children: Not on file   Years of education: Not on file   Highest education level: Not on file  Occupational History   Not on file  Tobacco Use   Smoking status: Every Day    Current packs/day: 0.25    Average packs/day: 0.3 packs/day for 56.0 years (14.0 ttl pk-yrs)    Types: Cigarettes   Smokeless tobacco: Never   Tobacco comments:    back to smoking X1 year  Vaping Use   Vaping status: Never Used  Substance and Sexual Activity   Alcohol use: No    Alcohol/week: 0.0 standard drinks of alcohol   Drug use: No   Sexual activity: Not Currently  Other Topics Concern   Not on file  Social History Narrative   Not on file   Social Drivers of Health   Financial Resource Strain: Low Risk  (01/03/2023)   Received from [facility]   Overall Financial Resource Strain (CARDIA)    Difficulty of Paying Living Expenses: Not hard at all  Food Insecurity: No Food Insecurity (01/17/2023)   Hunger Vital Sign    Worried About Running Out of Food in the Last Year: Never true    Ran Out of Food in the Last Year: Never true  Transportation Needs: No Transportation Needs (01/17/2023)   PRAPARE - Administrator, Civil Service (Medical): No    Lack of Transportation (Non-Medical): No  Physical Activity: Not on file  Stress: No Stress Concern Present (08/21/2018)   [employer] of Occupational Health - Occupational Stress Questionnaire    Feeling of Stress : Not at all  Social Connections: Not on file   Intimate Partner Violence: Not At Risk (01/17/2023)   Humiliation, Afraid, Rape, and Kick questionnaire    Fear of Current or Ex-Partner: No    Emotionally Abused: No    Physically Abused: No    Sexually Abused: No    FH:  Family History  Problem Relation Age of Onset   Heart attack Mother    Diabetes Mother    Heart disease Father    Lung cancer Daughter     Past Medical History:  Diagnosis Date   AAA (abdominal aortic aneurysm) (HCC)    a.  CTA 7/19: measured 4.5 x 5.3 cm and greatest transverse dimensions   Abnormal nuclear stress test    a.  Myoview 2012: anterior wall ischemia with an estimated EF of 26%.  This was a new wall motion abnormality as well as a newly reduced EF   Brain bleed (HCC)    COPD (chronic  obstructive pulmonary disease) (HCC)    Coronary artery disease    a.  Posterior MI in 2002 status post BMS to the LCx; b. LHC 2012: 95% stenosis of the proximal LAD, 95% stenosis of the mid LAD, 30% in-stent restenosis of the mid left circumflex with a second lesion of diffuse 50% stenosis.  The patient underwent successful PCI/BMS to the mid LAD with 0% residual stenosis, LV gram not performed   GERD (gastroesophageal reflux disease)    History of kidney stones    History of prostate cancer    Hyperlipidemia    Hypertension    Myocardial infarction [facility])    Neuromuscular disorder (HCC)    Prostate cancer (HCC)    a.  Status post seeding   PVD (peripheral vascular disease) (HCC)    Stroke [facility])     Current Outpatient Medications  Medication Sig Dispense Refill   acetaminophen (TYLENOL) 325 MG tablet Take 2 tablets (650 mg total) by mouth every 6 (six) hours as needed for mild pain (or Fever >/= 101).     albuterol (PROVENTIL) (2.5 MG/3ML) 0.083% nebulizer solution Take 2.5 mg by nebulization every 6 (six) hours as needed.     albuterol (VENTOLIN HFA) 108 (90 Base) MCG/ACT inhaler Inhale 2 puffs into the lungs every 6 (six) hours as needed for wheezing. 1 each  1   aspirin EC 81 MG tablet Take 81 mg by mouth daily.     atorvastatin (LIPITOR) 80 MG tablet Take 1 tablet (80 mg total) by mouth daily. 30 tablet 0   dapagliflozin propanediol (FARXIGA) 10 MG TABS tablet Take 1 tablet (10 mg total) by mouth daily. 30 tablet 2   furosemide (LASIX) 20 MG tablet Take 1 tablet (20 mg total) by mouth daily. Take 40 mg (2 tablets) on Monday and Friday only. Take Lasix 20 mg (1 tablet) on the other days. 180 tablet 3   mirtazapine (REMERON) 7.5 MG tablet Take 7.5 mg by mouth at bedtime.     Multiple Vitamins-Minerals (MULTIVITAMIN WITH MINERALS) tablet Take 1 tablet by mouth daily.     nicotine (NICODERM CQ - DOSED IN MG/24 HOURS) 14 mg/24hr patch Place 1 patch (14 mg total) onto the skin daily. (Patient not taking: Reported on 04/13/2023) 28 patch 0   omeprazole (PRILOSEC) 40 MG capsule TAKE 1 CAPSULE BY MOUTH DAILY USUALLY BEFORE BREAKFAST 30 capsule 0   sacubitril-valsartan (ENTRESTO) 24-26 MG Take 1 tablet by mouth 2 (two) times daily. 180 tablet 3   spironolactone (ALDACTONE) 25 MG tablet Take 0.5 tablets (12.5 mg total) by mouth daily. FOR CHRONIC COMBINED CHF 45 tablet 3   No current facility-administered medications for this visit.   Vitals:   06/28/23 0916  BP: (!) 128/51  Pulse: (!) 47  SpO2: 92%  Weight: 171 lb 9.6 oz (77.8 kg)   Wt Readings from Last 3 Encounters:  06/28/23 171 lb 9.6 oz (77.8 kg)  04/13/23 175 lb (79.4 kg)  03/23/23 181 lb 2 oz (82.2 kg)   Lab Results  Component Value Date   CREATININE 1.12 04/13/2023   CREATININE 1.29 (H) 03/23/2023   CREATININE 1.08 01/23/2023    PHYSICAL EXAM:  General: elderly appearing in wheelchair. No resp difficulty HEENT: normal Neck: supple, no JVD Cor: Regular rhythm, bradycardic. No rubs, gallops or murmurs Lungs: clear although diminished in bases Abdomen: soft, nontender, nondistended. Extremities: no cyanosis, clubbing, rash, edema Neuro: alert & oriented X 3. Moves all 4  extremities w/o difficulty. Affect pleasant  ECG: not done   ASSESSMENT & PLAN:  1: Ischemic heart failure with mildly reduced ejection fraction- - due to CAD with stent 2012 - NYHA class III; main limitation likely deconditioning and COPD - euvolemic - weighing daily  - weight down 10 pounds from last visit here 3 months ago - Echo 10/25/18: EF 30-35% with mild LVH - Echo 06/24/20: EF 30-35% with Grade 1 DD - Echo 02/21/21: EF 40-45% with mild LVH, Grade I DD - Echo 04/25/21: EF 30-35% with Grade I DD, mild LVH, moderately elevated PA pressure of 54.5 mmHg, mild Nathaniel - Echo 12/12/22: EF 40-45% with moderate LVH, Grade I DD, normal PA Pressure with mild Nathaniel - not adding salt to his food - continue farxiga 10mg  daily - continue furosemide 40mg  M & F and 20mg  the other days of the week - continue entresto 24/26mg  BID - continue spironolactone 12.5mg  daily - had bisoprolol stopped due to bradycardia/ hypotension - would titrate CHF meds slowly if able, as he previously didn't tolerated higher doses 2/2 significant hypotension. No change today  - BNP 01/23/23 was 663.3  2: HTN- - BP 128/51 - saw PCP Hyacinth Meeker) 09/24 - BMP 04/13/23 reviewed and showed sodium 145, potassium 4.0, creatinine 1.12 and GFR 67 - reports having lab work drawn earlier today for upcoming PCP appt next week  3: CAD- - saw cardiology Shea Evans) 12/24 - continue ASA 81mg  daily - continue atorvastatin 80mg  daily - LDL 12/29/22 was 67 - no s/s angina - LHC 01/05/11: 95% stenosis of the proximal LAD, 95% stenosis of the mid LAD, 30% in-stent restenosis of the mid left circumflex with a second lesion of diffuse 50% stenosis. Underwent successful PCI/BMS to the mid LAD with 0% residual stenosis .   4: COPD- - wearing oxygen at 4L around the clock - continues to use inhalers - continues to smoke but takes his oxygen off and doesn't smoke anywhere near the oxygen  5: Frequent PVC's/ PAC's- - keep K >4.0 - keep Mg  >2.0   Return in 6 months, sooner if needed.   Clarisa Kindred, FNP

## 2023-06-28 ENCOUNTER — Encounter: Payer: Self-pay | Admitting: Family

## 2023-06-28 ENCOUNTER — Ambulatory Visit: Attending: Family | Admitting: Family

## 2023-06-28 VITALS — BP 128/51 | HR 47 | Wt 171.6 lb

## 2023-06-28 DIAGNOSIS — Z79899 Other long term (current) drug therapy: Secondary | ICD-10-CM | POA: Insufficient documentation

## 2023-06-28 DIAGNOSIS — Z955 Presence of coronary angioplasty implant and graft: Secondary | ICD-10-CM | POA: Insufficient documentation

## 2023-06-28 DIAGNOSIS — J449 Chronic obstructive pulmonary disease, unspecified: Secondary | ICD-10-CM | POA: Insufficient documentation

## 2023-06-28 DIAGNOSIS — Z8546 Personal history of malignant neoplasm of prostate: Secondary | ICD-10-CM | POA: Diagnosis not present

## 2023-06-28 DIAGNOSIS — I2510 Atherosclerotic heart disease of native coronary artery without angina pectoris: Secondary | ICD-10-CM | POA: Diagnosis not present

## 2023-06-28 DIAGNOSIS — Z8679 Personal history of other diseases of the circulatory system: Secondary | ICD-10-CM | POA: Insufficient documentation

## 2023-06-28 DIAGNOSIS — I10 Essential (primary) hypertension: Secondary | ICD-10-CM | POA: Diagnosis not present

## 2023-06-28 DIAGNOSIS — I5042 Chronic combined systolic (congestive) and diastolic (congestive) heart failure: Secondary | ICD-10-CM | POA: Diagnosis not present

## 2023-06-28 DIAGNOSIS — Z8673 Personal history of transient ischemic attack (TIA), and cerebral infarction without residual deficits: Secondary | ICD-10-CM | POA: Diagnosis not present

## 2023-06-28 DIAGNOSIS — Z7982 Long term (current) use of aspirin: Secondary | ICD-10-CM | POA: Diagnosis not present

## 2023-06-28 DIAGNOSIS — E785 Hyperlipidemia, unspecified: Secondary | ICD-10-CM | POA: Insufficient documentation

## 2023-06-28 DIAGNOSIS — Z87442 Personal history of urinary calculi: Secondary | ICD-10-CM | POA: Insufficient documentation

## 2023-06-28 DIAGNOSIS — I110 Hypertensive heart disease with heart failure: Secondary | ICD-10-CM | POA: Diagnosis not present

## 2023-06-28 DIAGNOSIS — K219 Gastro-esophageal reflux disease without esophagitis: Secondary | ICD-10-CM | POA: Insufficient documentation

## 2023-06-28 DIAGNOSIS — F32A Depression, unspecified: Secondary | ICD-10-CM | POA: Diagnosis not present

## 2023-06-28 DIAGNOSIS — I272 Pulmonary hypertension, unspecified: Secondary | ICD-10-CM | POA: Diagnosis not present

## 2023-06-28 DIAGNOSIS — F17210 Nicotine dependence, cigarettes, uncomplicated: Secondary | ICD-10-CM | POA: Diagnosis not present

## 2023-06-28 DIAGNOSIS — R739 Hyperglycemia, unspecified: Secondary | ICD-10-CM | POA: Diagnosis not present

## 2023-06-28 DIAGNOSIS — I493 Ventricular premature depolarization: Secondary | ICD-10-CM | POA: Insufficient documentation

## 2023-06-28 DIAGNOSIS — I252 Old myocardial infarction: Secondary | ICD-10-CM | POA: Insufficient documentation

## 2023-06-28 DIAGNOSIS — E782 Mixed hyperlipidemia: Secondary | ICD-10-CM | POA: Diagnosis not present

## 2023-06-28 DIAGNOSIS — I5022 Chronic systolic (congestive) heart failure: Secondary | ICD-10-CM | POA: Diagnosis not present

## 2023-06-28 DIAGNOSIS — Z9981 Dependence on supplemental oxygen: Secondary | ICD-10-CM | POA: Insufficient documentation

## 2023-06-28 DIAGNOSIS — R0602 Shortness of breath: Secondary | ICD-10-CM | POA: Diagnosis present

## 2023-06-28 DIAGNOSIS — I959 Hypotension, unspecified: Secondary | ICD-10-CM | POA: Diagnosis not present

## 2023-06-28 DIAGNOSIS — F419 Anxiety disorder, unspecified: Secondary | ICD-10-CM | POA: Diagnosis not present

## 2023-06-28 DIAGNOSIS — Z125 Encounter for screening for malignant neoplasm of prostate: Secondary | ICD-10-CM | POA: Diagnosis not present

## 2023-06-28 MED ORDER — ATORVASTATIN CALCIUM 80 MG PO TABS
80.0000 mg | ORAL_TABLET | Freq: Every day | ORAL | 3 refills | Status: AC
Start: 2023-06-28 — End: ?

## 2023-06-28 MED ORDER — FUROSEMIDE 20 MG PO TABS: 20.0000 mg | ORAL_TABLET | Freq: Every day | ORAL | 3 refills | Status: DC

## 2023-06-28 MED ORDER — DAPAGLIFLOZIN PROPANEDIOL 10 MG PO TABS: 10.0000 mg | ORAL_TABLET | Freq: Every day | ORAL | 3 refills | Status: AC

## 2023-06-28 MED ORDER — SPIRONOLACTONE 25 MG PO TABS: 12.5000 mg | ORAL_TABLET | Freq: Every day | ORAL | 3 refills | Status: AC

## 2023-06-28 NOTE — Patient Instructions (Signed)
 Follow-Up in: 6 months with Clarisa Kindred, FNP  At the [facility], you and your health needs are our priority. We have a designated team specialized in the treatment of Heart Failure. This Care Team includes your primary Heart Failure Specialized Cardiologist (physician), Advanced Practice Providers (APPs- Physician Assistants and Nurse Practitioners), and Pharmacist who all work together to provide you with the care you need, when you need it.   You may see any of the following providers on your designated Care Team at your next follow up:  Dr. Arvilla Meres Dr. Marca Ancona Dr. Dorthula Nettles Dr. Theresia Bough Clarisa Kindred, FNP Enos Fling, RPH-CPP  Please be sure to bring in all your medications bottles to every appointment.   Need to Contact us:  If you have any questions or concerns before your next appointment please send [place] a message through [place] or call our office at [phone].    TO LEAVE A MESSAGE FOR THE NURSE SELECT OPTION 2, PLEASE LEAVE A MESSAGE INCLUDING: YOUR NAME DATE OF BIRTH CALL BACK NUMBER REASON FOR CALL**this is important as we prioritize the call backs  YOU WILL RECEIVE A CALL BACK THE SAME DAY AS LONG AS YOU CALL BEFORE 4:00 PM

## 2023-07-04 ENCOUNTER — Inpatient Hospital Stay (HOSPITAL_COMMUNITY)
Admission: EM | Admit: 2023-07-04 | Discharge: 2023-07-11 | DRG: 871 | Disposition: A | Discharge status: Skilled Nursing Facility | Attending: Internal Medicine | Admitting: Internal Medicine

## 2023-07-04 ENCOUNTER — Encounter (HOSPITAL_COMMUNITY): Payer: Self-pay

## 2023-07-04 ENCOUNTER — Other Ambulatory Visit: Payer: Self-pay

## 2023-07-04 ENCOUNTER — Emergency Department (HOSPITAL_COMMUNITY)

## 2023-07-04 DIAGNOSIS — F17210 Nicotine dependence, cigarettes, uncomplicated: Secondary | ICD-10-CM | POA: Diagnosis present

## 2023-07-04 DIAGNOSIS — A419 Sepsis, unspecified organism: Secondary | ICD-10-CM | POA: Diagnosis not present

## 2023-07-04 DIAGNOSIS — R000 Tachycardia, unspecified: Secondary | ICD-10-CM | POA: Diagnosis not present

## 2023-07-04 DIAGNOSIS — I5042 Chronic combined systolic (congestive) and diastolic (congestive) heart failure: Secondary | ICD-10-CM | POA: Diagnosis not present

## 2023-07-04 DIAGNOSIS — D6959 Other secondary thrombocytopenia: Secondary | ICD-10-CM | POA: Diagnosis not present

## 2023-07-04 DIAGNOSIS — E785 Hyperlipidemia, unspecified: Secondary | ICD-10-CM | POA: Diagnosis not present

## 2023-07-04 DIAGNOSIS — Z8673 Personal history of transient ischemic attack (TIA), and cerebral infarction without residual deficits: Secondary | ICD-10-CM | POA: Diagnosis not present

## 2023-07-04 DIAGNOSIS — Z8679 Personal history of other diseases of the circulatory system: Secondary | ICD-10-CM

## 2023-07-04 DIAGNOSIS — Z7952 Long term (current) use of systemic steroids: Secondary | ICD-10-CM

## 2023-07-04 DIAGNOSIS — J440 Chronic obstructive pulmonary disease with acute lower respiratory infection: Secondary | ICD-10-CM | POA: Diagnosis not present

## 2023-07-04 DIAGNOSIS — Z833 Family history of diabetes mellitus: Secondary | ICD-10-CM

## 2023-07-04 DIAGNOSIS — M6281 Muscle weakness (generalized): Secondary | ICD-10-CM | POA: Diagnosis not present

## 2023-07-04 DIAGNOSIS — J441 Chronic obstructive pulmonary disease with (acute) exacerbation: Secondary | ICD-10-CM | POA: Diagnosis present

## 2023-07-04 DIAGNOSIS — Z8546 Personal history of malignant neoplasm of prostate: Secondary | ICD-10-CM | POA: Diagnosis not present

## 2023-07-04 DIAGNOSIS — I25118 Atherosclerotic heart disease of native coronary artery with other forms of angina pectoris: Secondary | ICD-10-CM | POA: Diagnosis not present

## 2023-07-04 DIAGNOSIS — Z801 Family history of malignant neoplasm of trachea, bronchus and lung: Secondary | ICD-10-CM | POA: Diagnosis not present

## 2023-07-04 DIAGNOSIS — J189 Pneumonia, unspecified organism: Secondary | ICD-10-CM | POA: Diagnosis not present

## 2023-07-04 DIAGNOSIS — J984 Other disorders of lung: Secondary | ICD-10-CM | POA: Diagnosis not present

## 2023-07-04 DIAGNOSIS — Z23 Encounter for immunization: Secondary | ICD-10-CM | POA: Diagnosis not present

## 2023-07-04 DIAGNOSIS — Z8249 Family history of ischemic heart disease and other diseases of the circulatory system: Secondary | ICD-10-CM | POA: Diagnosis not present

## 2023-07-04 DIAGNOSIS — K219 Gastro-esophageal reflux disease without esophagitis: Secondary | ICD-10-CM | POA: Diagnosis present

## 2023-07-04 DIAGNOSIS — Z7982 Long term (current) use of aspirin: Secondary | ICD-10-CM | POA: Diagnosis not present

## 2023-07-04 DIAGNOSIS — I5023 Acute on chronic systolic (congestive) heart failure: Secondary | ICD-10-CM | POA: Diagnosis present

## 2023-07-04 DIAGNOSIS — R509 Fever, unspecified: Secondary | ICD-10-CM | POA: Diagnosis not present

## 2023-07-04 DIAGNOSIS — Z1152 Encounter for screening for COVID-19: Secondary | ICD-10-CM | POA: Diagnosis not present

## 2023-07-04 DIAGNOSIS — R059 Cough, unspecified: Secondary | ICD-10-CM | POA: Diagnosis not present

## 2023-07-04 DIAGNOSIS — R918 Other nonspecific abnormal finding of lung field: Secondary | ICD-10-CM | POA: Diagnosis not present

## 2023-07-04 DIAGNOSIS — J9621 Acute and chronic respiratory failure with hypoxia: Secondary | ICD-10-CM | POA: Diagnosis not present

## 2023-07-04 DIAGNOSIS — I252 Old myocardial infarction: Secondary | ICD-10-CM

## 2023-07-04 DIAGNOSIS — J9811 Atelectasis: Secondary | ICD-10-CM | POA: Diagnosis not present

## 2023-07-04 DIAGNOSIS — I110 Hypertensive heart disease with heart failure: Secondary | ICD-10-CM | POA: Diagnosis present

## 2023-07-04 DIAGNOSIS — I5022 Chronic systolic (congestive) heart failure: Secondary | ICD-10-CM | POA: Diagnosis present

## 2023-07-04 DIAGNOSIS — R062 Wheezing: Secondary | ICD-10-CM | POA: Diagnosis not present

## 2023-07-04 DIAGNOSIS — J90 Pleural effusion, not elsewhere classified: Secondary | ICD-10-CM | POA: Diagnosis not present

## 2023-07-04 DIAGNOSIS — R6520 Severe sepsis without septic shock: Secondary | ICD-10-CM | POA: Diagnosis not present

## 2023-07-04 DIAGNOSIS — R2689 Other abnormalities of gait and mobility: Secondary | ICD-10-CM | POA: Diagnosis not present

## 2023-07-04 DIAGNOSIS — E876 Hypokalemia: Secondary | ICD-10-CM | POA: Diagnosis not present

## 2023-07-04 DIAGNOSIS — R0902 Hypoxemia: Secondary | ICD-10-CM | POA: Diagnosis not present

## 2023-07-04 DIAGNOSIS — I700 Atherosclerosis of aorta: Secondary | ICD-10-CM | POA: Diagnosis not present

## 2023-07-04 DIAGNOSIS — R278 Other lack of coordination: Secondary | ICD-10-CM | POA: Diagnosis not present

## 2023-07-04 DIAGNOSIS — R4182 Altered mental status, unspecified: Secondary | ICD-10-CM | POA: Diagnosis not present

## 2023-07-04 DIAGNOSIS — I739 Peripheral vascular disease, unspecified: Secondary | ICD-10-CM | POA: Diagnosis not present

## 2023-07-04 DIAGNOSIS — R0602 Shortness of breath: Secondary | ICD-10-CM | POA: Diagnosis not present

## 2023-07-04 DIAGNOSIS — Z7185 Encounter for immunization safety counseling: Secondary | ICD-10-CM | POA: Diagnosis not present

## 2023-07-04 DIAGNOSIS — I517 Cardiomegaly: Secondary | ICD-10-CM | POA: Diagnosis not present

## 2023-07-04 DIAGNOSIS — I10 Essential (primary) hypertension: Secondary | ICD-10-CM | POA: Diagnosis present

## 2023-07-04 LAB — RESP PANEL BY RT-PCR (RSV, FLU A&B, COVID)  RVPGX2
Influenza A by PCR: NEGATIVE
Influenza B by PCR: NEGATIVE
Resp Syncytial Virus by PCR: NEGATIVE
SARS Coronavirus 2 by RT PCR: NEGATIVE

## 2023-07-04 LAB — URINALYSIS, W/ REFLEX TO CULTURE (INFECTION SUSPECTED)
Bilirubin Urine: NEGATIVE
Glucose, UA: >=500 mg/dL — AB
Hgb urine dipstick: NEGATIVE
Ketones, ur: NEGATIVE mg/dL
Leukocytes,Ua: NEGATIVE
Nitrite: NEGATIVE
Protein, ur: NEGATIVE mg/dL
Specific Gravity, Urine: 1.018 (ref 1.005–1.030)
pH: 5 (ref 5.0–8.0)

## 2023-07-04 LAB — PROTIME-INR
INR: 1.1 (ref 0.8–1.2)
Prothrombin Time: 14.8 s (ref 11.4–15.2)

## 2023-07-04 LAB — I-STAT VENOUS BLOOD GAS, ED
Acid-Base Excess: 5 mmol/L — ABNORMAL HIGH (ref 0.0–2.0)
Bicarbonate: 31.7 mmol/L — ABNORMAL HIGH (ref 20.0–28.0)
Calcium, Ion: 1.05 mmol/L — ABNORMAL LOW (ref 1.15–1.40)
HCT: 44 % (ref 39.0–52.0)
Hemoglobin: 15 g/dL (ref 13.0–17.0)
O2 Saturation: 89 %
Potassium: 3.7 mmol/L (ref 3.5–5.1)
Sodium: 141 mmol/L (ref 135–145)
TCO2: 33 mmol/L — ABNORMAL HIGH (ref 22–32)
pCO2, Ven: 51.4 mmHg (ref 44–60)
pH, Ven: 7.399 (ref 7.25–7.43)
pO2, Ven: 57 mmHg — ABNORMAL HIGH (ref 32–45)

## 2023-07-04 LAB — CBC WITH DIFFERENTIAL/PLATELET
Abs Immature Granulocytes: 0 10*3/uL (ref 0.00–0.07)
Basophils Absolute: 0 10*3/uL (ref 0.0–0.1)
Basophils Relative: 0 %
Eosinophils Absolute: 0 10*3/uL (ref 0.0–0.5)
Eosinophils Relative: 0 %
HCT: 44.6 % (ref 39.0–52.0)
Hemoglobin: 14.1 g/dL (ref 13.0–17.0)
Lymphocytes Relative: 2 %
Lymphs Abs: 0.4 10*3/uL — ABNORMAL LOW (ref 0.7–4.0)
MCH: 31.5 pg (ref 26.0–34.0)
MCHC: 31.6 g/dL (ref 30.0–36.0)
MCV: 99.6 fL (ref 80.0–100.0)
Monocytes Absolute: 0.8 10*3/uL (ref 0.1–1.0)
Monocytes Relative: 4 %
Neutro Abs: 17.8 10*3/uL — ABNORMAL HIGH (ref 1.7–7.7)
Neutrophils Relative %: 94 %
Platelets: 144 10*3/uL — ABNORMAL LOW (ref 150–400)
RBC: 4.48 MIL/uL (ref 4.22–5.81)
RDW: 12.8 % (ref 11.5–15.5)
WBC: 18.9 10*3/uL — ABNORMAL HIGH (ref 4.0–10.5)
nRBC: 0 % (ref 0.0–0.2)
nRBC: 0 /100{WBCs}

## 2023-07-04 LAB — RESPIRATORY PANEL BY PCR

## 2023-07-04 LAB — COMPREHENSIVE METABOLIC PANEL
ALT: 12 U/L (ref 0–44)
AST: 19 U/L (ref 15–41)
Albumin: 3.2 g/dL — ABNORMAL LOW (ref 3.5–5.0)
Alkaline Phosphatase: 64 U/L (ref 38–126)
Anion gap: 11 (ref 5–15)
BUN: 16 mg/dL (ref 8–23)
CO2: 26 mmol/L (ref 22–32)
Calcium: 8.3 mg/dL — ABNORMAL LOW (ref 8.9–10.3)
Chloride: 103 mmol/L (ref 98–111)
Creatinine, Ser: 1.38 mg/dL — ABNORMAL HIGH (ref 0.61–1.24)
GFR, Estimated: 52 mL/min — ABNORMAL LOW (ref >60)
Glucose, Bld: 155 mg/dL — ABNORMAL HIGH (ref 70–99)
Potassium: 3.5 mmol/L (ref 3.5–5.1)
Sodium: 140 mmol/L (ref 135–145)
Total Bilirubin: 1.5 mg/dL — ABNORMAL HIGH (ref 0.0–1.2)
Total Protein: 6.4 g/dL — ABNORMAL LOW (ref 6.5–8.1)

## 2023-07-04 LAB — TROPONIN I (HIGH SENSITIVITY)
Troponin I (High Sensitivity): 26 ng/L — ABNORMAL HIGH (ref <18)
Troponin I (High Sensitivity): 31 ng/L — ABNORMAL HIGH (ref <18)

## 2023-07-04 LAB — APTT: aPTT: 28 s (ref 24–36)

## 2023-07-04 LAB — LACTIC ACID, PLASMA
Lactic Acid, Venous: 2.2 mmol/L (ref 0.5–1.9)
Lactic Acid, Venous: 1.1 mmol/L (ref 0.5–1.9)

## 2023-07-04 LAB — I-STAT CG4 LACTIC ACID, ED
Lactic Acid, Venous: 2.8 mmol/L (ref 0.5–1.9)
Lactic Acid, Venous: 2.4 mmol/L (ref 0.5–1.9)

## 2023-07-04 LAB — BRAIN NATRIURETIC PEPTIDE: B Natriuretic Peptide: 384.6 pg/mL — ABNORMAL HIGH (ref 0.0–100.0)

## 2023-07-04 MED ORDER — LACTATED RINGERS IV SOLN: INTRAVENOUS | Status: DC

## 2023-07-04 MED ORDER — METRONIDAZOLE 500 MG/100ML IV SOLN
500.0000 mg | Freq: Once | INTRAVENOUS | Status: AC
  Administered 2023-07-04: 500 mg via INTRAVENOUS
  Filled 2023-07-04: qty 100

## 2023-07-04 MED ORDER — LACTATED RINGERS IV BOLUS (SEPSIS)
500.0000 mL | Freq: Once | INTRAVENOUS | Status: AC
  Administered 2023-07-04: 500 mL via INTRAVENOUS

## 2023-07-04 MED ORDER — SODIUM CHLORIDE 0.9 % IV SOLN
2.0000 g | INTRAVENOUS | Status: AC
  Administered 2023-07-04 – 2023-07-08 (×5): 2 g via INTRAVENOUS
  Filled 2023-07-04 (×5): qty 20

## 2023-07-04 MED ORDER — OXYCODONE HCL 5 MG PO TABS
5.0000 mg | ORAL_TABLET | Freq: Four times a day (QID) | ORAL | Status: DC | PRN
  Administered 2023-07-04 – 2023-07-09 (×6): 5 mg via ORAL
  Filled 2023-07-04 (×6): qty 1

## 2023-07-04 MED ORDER — PREDNISONE 20 MG PO TABS
40.0000 mg | ORAL_TABLET | Freq: Every day | ORAL | Status: AC
  Administered 2023-07-05 – 2023-07-08 (×4): 40 mg via ORAL
  Filled 2023-07-04 (×4): qty 2

## 2023-07-04 MED ORDER — SODIUM CHLORIDE 0.9% FLUSH
3.0000 mL | Freq: Two times a day (BID) | INTRAVENOUS | Status: DC
  Administered 2023-07-04 – 2023-07-11 (×13): 3 mL via INTRAVENOUS

## 2023-07-04 MED ORDER — IPRATROPIUM-ALBUTEROL 0.5-2.5 (3) MG/3ML IN SOLN
3.0000 mL | RESPIRATORY_TRACT | Status: DC | PRN
  Administered 2023-07-04 – 2023-07-07 (×2): 3 mL via RESPIRATORY_TRACT
  Filled 2023-07-04 (×2): qty 3

## 2023-07-04 MED ORDER — ASPIRIN 81 MG PO TBEC
81.0000 mg | DELAYED_RELEASE_TABLET | Freq: Every day | ORAL | Status: DC
  Administered 2023-07-04 – 2023-07-11 (×8): 81 mg via ORAL
  Filled 2023-07-04 (×8): qty 1

## 2023-07-04 MED ORDER — VANCOMYCIN HCL IN DEXTROSE 1-5 GM/200ML-% IV SOLN: 1000.0000 mg | Freq: Once | INTRAVENOUS | Status: DC

## 2023-07-04 MED ORDER — ACETAMINOPHEN 325 MG PO TABS
650.0000 mg | ORAL_TABLET | Freq: Four times a day (QID) | ORAL | Status: DC | PRN
  Administered 2023-07-04 – 2023-07-11 (×3): 650 mg via ORAL
  Filled 2023-07-04 (×3): qty 2

## 2023-07-04 MED ORDER — METHYLPREDNISOLONE SODIUM SUCC 125 MG IJ SOLR
125.0000 mg | Freq: Two times a day (BID) | INTRAMUSCULAR | Status: AC
  Administered 2023-07-04 (×2): 125 mg via INTRAVENOUS
  Filled 2023-07-04 (×2): qty 2

## 2023-07-04 MED ORDER — ATORVASTATIN CALCIUM 80 MG PO TABS
80.0000 mg | ORAL_TABLET | Freq: Every day | ORAL | Status: DC
  Administered 2023-07-04 – 2023-07-11 (×8): 80 mg via ORAL
  Filled 2023-07-04 (×7): qty 1
  Filled 2023-07-04: qty 2

## 2023-07-04 MED ORDER — LACTATED RINGERS IV BOLUS
1000.0000 mL | Freq: Once | INTRAVENOUS | Status: AC
  Administered 2023-07-04: 1000 mL via INTRAVENOUS

## 2023-07-04 MED ORDER — ONDANSETRON HCL 4 MG/2ML IJ SOLN: 4.0000 mg | Freq: Four times a day (QID) | INTRAMUSCULAR | Status: DC | PRN

## 2023-07-04 MED ORDER — SODIUM CHLORIDE 0.9 % IV SOLN
2.0000 g | Freq: Once | INTRAVENOUS | Status: AC
  Administered 2023-07-04: 2 g via INTRAVENOUS
  Filled 2023-07-04: qty 12.5

## 2023-07-04 MED ORDER — ACETAMINOPHEN 500 MG PO TABS
1000.0000 mg | ORAL_TABLET | Freq: Once | ORAL | Status: AC
  Administered 2023-07-04: 1000 mg via ORAL
  Filled 2023-07-04: qty 2

## 2023-07-04 MED ORDER — PANTOPRAZOLE SODIUM 40 MG PO TBEC
40.0000 mg | DELAYED_RELEASE_TABLET | Freq: Every day | ORAL | Status: DC
  Administered 2023-07-04 – 2023-07-11 (×8): 40 mg via ORAL
  Filled 2023-07-04 (×8): qty 1

## 2023-07-04 MED ORDER — ONDANSETRON HCL 4 MG PO TABS: 4.0000 mg | ORAL_TABLET | Freq: Four times a day (QID) | ORAL | Status: DC | PRN

## 2023-07-04 MED ORDER — SENNOSIDES-DOCUSATE SODIUM 8.6-50 MG PO TABS: 1.0000 | ORAL_TABLET | Freq: Every evening | ORAL | Status: DC | PRN

## 2023-07-04 MED ORDER — AZITHROMYCIN 500 MG PO TABS
500.0000 mg | ORAL_TABLET | Freq: Every day | ORAL | Status: AC
  Administered 2023-07-04 – 2023-07-08 (×5): 500 mg via ORAL
  Filled 2023-07-04 (×2): qty 1
  Filled 2023-07-04: qty 2
  Filled 2023-07-04 (×2): qty 1

## 2023-07-04 MED ORDER — LACTATED RINGERS IV BOLUS
500.0000 mL | Freq: Once | INTRAVENOUS | Status: AC
  Administered 2023-07-04: 500 mL via INTRAVENOUS

## 2023-07-04 MED ORDER — VANCOMYCIN HCL 1500 MG/300ML IV SOLN
1500.0000 mg | Freq: Once | INTRAVENOUS | Status: AC
  Administered 2023-07-04: 1500 mg via INTRAVENOUS
  Filled 2023-07-04: qty 300

## 2023-07-04 MED ORDER — ACETAMINOPHEN 650 MG RE SUPP: 650.0000 mg | Freq: Four times a day (QID) | RECTAL | Status: DC | PRN

## 2023-07-04 MED ORDER — ENOXAPARIN SODIUM 40 MG/0.4ML IJ SOSY
40.0000 mg | PREFILLED_SYRINGE | INTRAMUSCULAR | Status: DC
  Administered 2023-07-04 – 2023-07-11 (×7): 40 mg via SUBCUTANEOUS
  Filled 2023-07-04 (×7): qty 0.4

## 2023-07-04 NOTE — ED Notes (Signed)
 Pt I stat lactic down to 2.4 . Provider informed.

## 2023-07-04 NOTE — ED Provider Notes (Signed)
 [facility] Emergency Department Provider Note MRN:  [id number]  Arrival date & time: 07/04/23     Chief Complaint   Respiratory Distress   History of Present Illness   [name] is a 78 y.o. year-old male presents to the ED with chief complaint of respiratory distress.  He states that he has had cough and SOB and that acutely worsened this afternoon.  BIB EMS.  Was given 2 duonebs.  Hx of COPD, CHF, chronic respiratory failure on 4L [place] at baseline.[name]  History provided by patient.   Review of Systems  Pertinent positive and negative review of systems noted in HPI.    Physical Exam   Vitals:   07/04/23 0245 07/04/23 0400  BP: (!) 118/53 (!) 107/52  Pulse: 100 90  Resp: (!) 30 (!) 27  Temp:    SpO2: 97% 98%    CONSTITUTIONAL:  chronically ill, short of breath-appearing, NAD NEURO:  Alert and oriented x 3, CN 3-12 grossly intact EYES:  eyes equal and reactive ENT/NECK:  Supple, no stridor  CARDIO:  tachycardic, regular rhythm, appears well-perfused  PULM:  diminished, increased WOB, expiratory wheezes GI/GU:  non-distended,  MSK/SPINE:  No gross deformities, no edema, moves all extremities  SKIN:  no rash, atraumatic   *Additional and/or pertinent findings included in MDM below  Diagnostic and Interventional Summary    EKG Interpretation Date/Time:    Ventricular Rate:    PR Interval:    QRS Duration:    QT Interval:    QTC Calculation:   R Axis:      Text Interpretation:         Labs Reviewed  COMPREHENSIVE METABOLIC PANEL - Abnormal; Notable for the following components:      Result Value   Glucose, Bld 155 (*)    Creatinine, Ser 1.38 (*)    Calcium 8.3 (*)    Total Protein 6.4 (*)    Albumin 3.2 (*)    Total Bilirubin 1.5 (*)    GFR, Estimated 52 (*)    All other components within normal limits  CBC WITH DIFFERENTIAL/PLATELET - Abnormal; Notable for the following components:   WBC 18.9 (*)    Platelets 144 (*)     Neutro Abs 17.8 (*)    Lymphs Abs 0.4 (*)    All other components within normal limits  BRAIN NATRIURETIC PEPTIDE - Abnormal; Notable for the following components:   B Natriuretic Peptide 384.6 (*)    All other components within normal limits  I-STAT CG4 LACTIC ACID, ED - Abnormal; Notable for the following components:   Lactic Acid, Venous 2.8 (*)    All other components within normal limits  I-STAT VENOUS BLOOD GAS, ED - Abnormal; Notable for the following components:   pO2, Ven 57 (*)    Bicarbonate 31.7 (*)    TCO2 33 (*)    Acid-Base Excess 5.0 (*)    Calcium, Ion 1.05 (*)    All other components within normal limits  I-STAT CG4 LACTIC ACID, ED - Abnormal; Notable for the following components:   Lactic Acid, Venous 2.4 (*)    All other components within normal limits  TROPONIN I (HIGH SENSITIVITY) - Abnormal; Notable for the following components:   Troponin I (High Sensitivity) 26 (*)    All other components within normal limits  RESP PANEL BY RT-PCR (RSV, FLU A&B, COVID)  RVPGX2  CULTURE, BLOOD (ROUTINE X 2)  CULTURE, BLOOD (ROUTINE X 2)  PROTIME-INR  APTT  URINALYSIS, W/ REFLEX TO CULTURE (INFECTION SUSPECTED)  TROPONIN I (HIGH SENSITIVITY)    DG Chest Port 1 View  Final Result      Medications  lactated ringers infusion ( Intravenous New Bag/Given 07/04/23 0302)  lactated ringers bolus 500 mL (0 mLs Intravenous Stopped 07/04/23 0303)  ceFEPIme (MAXIPIME) 2 g in sodium chloride 0.9 % 100 mL IVPB (0 g Intravenous Stopped 07/04/23 0223)  metroNIDAZOLE (FLAGYL) IVPB 500 mg (0 mg Intravenous Stopped 07/04/23 0305)  acetaminophen (TYLENOL) tablet 1,000 mg (1,000 mg Oral Given 07/04/23 0226)  vancomycin (VANCOREADY) IVPB 1500 mg/300 mL (1,500 mg Intravenous New Bag/Given 07/04/23 0231)  lactated ringers bolus 1,000 mL (0 mLs Intravenous Stopped 07/04/23 0305)     Procedures  /  Critical Care .Critical Care  Performed by: Roxy Horseman, PA-C Authorized by: Roxy Horseman, PA-C   Critical care provider statement:    Critical care time (minutes):  53   Critical care was necessary to treat or prevent imminent or life-threatening deterioration of the following conditions:  Sepsis and respiratory failure   Critical care was time spent personally by me on the following activities:  Development of treatment plan with patient or surrogate, discussions with consultants, evaluation of patient's response to treatment, examination of patient, ordering and review of laboratory studies, ordering and review of radiographic studies, ordering and performing treatments and interventions, pulse oximetry, re-evaluation of patient's condition and review of old charts   ED Course and Medical Decision Making  I have reviewed the triage vital signs, the nursing notes, and pertinent available records from the EMR.  Social Determinants Affecting Complexity of Care: Patient has no clinically significant social determinants affecting this chief complaint..   ED Course: Clinical Course as of 07/04/23 0456  Wed Jul 04, 2023  0153 Last EF 40-45% in 2024.  Will be cautious with fluids.  Not hypotensive.  Lactic <4. [RB]  0454 I-Stat Lactic Acid, ED(!!) Down trending 2.8->2.4 [RB]  0455 Troponin I (High Sensitivity)(!) Mildly elevated trop at 26, thought 2/2 demand [RB]  0455 Brain natriuretic peptide(!) BNP mildly elevated, but with fever and productive cough, suspect that pneumonia is the more likely cause of respiratory distress. [RB]  0455 DG Chest Port 1 View No obvious effusion [RB]    Clinical Course User Index [RB] Roxy Horseman, PA-C    Medical Decision Making Patient here with fever, cough, SOB.  Meets SIRS criteria.  Code sepsis activated.  Patient started on broad spectrum abx and fluids.  Caution given with fluids due to CHF.  Last EF was 40-45%.  Covid and flu negative.    Amount and/or Complexity of Data Reviewed Labs: ordered. Decision-making details  documented in ED Course. Radiology: ordered and independent interpretation performed. Decision-making details documented in ED Course.  Risk OTC drugs. Prescription drug management. Decision regarding hospitalization.         Consultants: I consulted with Hospitalist, Dr. Antionette Char, who is appreciated for admitting.   Treatment and Plan: Patient's exam and diagnostic results are concerning for pneumonia with acute on chronic respiratory failure.  Feel that patient will need admission to the hospital for further treatment and evaluation.    Final Clinical Impressions(s) / ED Diagnoses     ICD-10-CM   1. Sepsis, due to unspecified organism, unspecified whether acute organ dysfunction present [facility])  A41.9       ED Discharge Orders     None         Discharge Instructions Discussed with and Provided to  Patient:   Discharge Instructions   None      Roxy Horseman, PA-C 07/04/23 0500    Dione Booze, MD 07/04/23 [phone]

## 2023-07-04 NOTE — ED Triage Notes (Signed)
 Pt bib [facility] coming from home c/o sob. Diminished, expiratory wheeze with EMS, duoneb x2 given. Pt warm to touch. Alert and oriented x3 (disoriented to time). Pt reports being sick for past week. Pt has hx of CHF and COPD. Pt wears 4L [place] baseline.  EMS vitals:  40 RR 130 HR 93% on NRB  86/40  200cc LR  18 LAC

## 2023-07-04 NOTE — H&P (Signed)
 History and Physical    [name] HKV:[id number] DOB: January 19, 1946 DOA: 07/04/2023  PCP: Danella Penton, MD   Patient coming from: Home   Chief Complaint: SOB, cough, chills  HPI: [name] is a 78 y.o. male with medical history significant for hypertension, hyperlipidemia, COPD, chronic hypoxic respiratory failure, AAA status post endovascular repair, CAD, and chronic HFmrEF who presents with cough, shortness of breath, and chills.  Patient reports that he developed chills and increased cough a couple days ago.  Since then, he has become progressively short of breath.  He was having difficulty catching his breath at rest last night and EMS was called.   Patient reports that his cough has been productive. He denies chest pain, abdominal pain, dysuria, or leg swelling.   He was found by EMS to be in respiratory distress with heart rate 130, respiratory rate 40, blood pressure 86/40, and oxygen saturation 93% on NRB.  He was treated with DuoNeb x 2 prior to arrival in the ED  ED Course: Upon arrival to the ED, patient is found to be febrile 39.3 C and saturating mid 90s on NRB with tachypnea, tachycardia, and systolic blood pressure in the low 100s.  Labs are most notable for creatinine 1.38, WBC 18,900, lactic acid 2.8, and negative respiratory virus panel.  Chest x-ray demonstrates retrocardiac opacity.  Blood cultures were collected in the ED and the patient was treated with 1.5 L of LR, acetaminophen, vancomycin, cefepime, and Flagyl.  Review of Systems:  All other systems reviewed and apart from HPI, are negative.  Past Medical History:  Diagnosis Date   AAA (abdominal aortic aneurysm) (HCC)    a.  CTA 7/19: measured 4.5 x 5.3 cm and greatest transverse dimensions   Abnormal nuclear stress test    a.  Myoview 2012: anterior wall ischemia with an estimated EF of 26%.  This was a new wall motion abnormality as well as a newly reduced EF   Brain bleed (HCC)    COPD (chronic  obstructive pulmonary disease) (HCC)    Coronary artery disease    a.  Posterior MI in 2002 status post BMS to the LCx; b. LHC 2012: 95% stenosis of the proximal LAD, 95% stenosis of the mid LAD, 30% in-stent restenosis of the mid left circumflex with a second lesion of diffuse 50% stenosis.  The patient underwent successful PCI/BMS to the mid LAD with 0% residual stenosis, LV gram not performed   GERD (gastroesophageal reflux disease)    History of kidney stones    History of prostate cancer    Hyperlipidemia    Hypertension    Myocardial infarction [facility])    Neuromuscular disorder (HCC)    Prostate cancer (HCC)    a.  Status post seeding   PVD (peripheral vascular disease) (HCC)    Stroke [facility])     Past Surgical History:  Procedure Laterality Date   BACK SURGERY  2009/2010   x 2   BOWEL RESECTION N/A 03/18/2021   Procedure: SMALL BOWEL RESECTION;  Surgeon: Diamantina Monks, MD;  Location: [facility] OR;  Service: General;  Laterality: N/A;   CARDIAC CATHETERIZATION  2002   stent placement [place]   ENDOVASCULAR REPAIR/STENT GRAFT N/A 01/02/2018   Procedure: ENDOVASCULAR REPAIR/STENT GRAFT;  Surgeon: Annice Needy, MD;  Location: [facility] INVASIVE CV LAB;  Service: Cardiovascular;  Laterality: N/A;   FOOT SURGERY     bilateral    HERNIA REPAIR     bilateral  INGUINAL HERNIA REPAIR Left 03/18/2021   Procedure: INGUINAL HERNIA REPAIR;  Surgeon: Diamantina Monks, MD;  Location: [facility] OR;  Service: General;  Laterality: Left;   INSERTION OF MESH Left 03/18/2021   Procedure: INSERTION OF MESH;  Surgeon: Diamantina Monks, MD;  Location: [facility] OR;  Service: General;  Laterality: Left;   KNEE SURGERY     right knee    PROSTATE SURGERY  09/2009   prostate implant   SPINE SURGERY     UMBILICAL HERNIA REPAIR N/A 03/18/2021   Procedure: UMBILICAL HERNIA REPAIR;  Surgeon: Diamantina Monks, MD;  Location: [facility] OR;  Service: General;  Laterality: N/A;    Social History:   reports that he has been smoking  cigarettes. He has a 14 pack-year smoking history. He has never used smokeless tobacco. He reports that he does not drink alcohol and does not use drugs.  Allergies  Allergen Reactions   Lyrica [Pregabalin] Swelling         Family History  Problem Relation Age of Onset   Heart attack Mother    Diabetes Mother    Heart disease Father    Lung cancer Daughter      Prior to Admission medications   Medication Sig Start Date End Date Taking? Authorizing Provider  acetaminophen (TYLENOL) 325 MG tablet Take 2 tablets (650 mg total) by mouth every 6 (six) hours as needed for mild pain (or Fever >/= 101). 04/28/21   Elgergawy, Leana Roe, MD  albuterol (PROVENTIL) (2.5 MG/3ML) 0.083% nebulizer solution Take 2.5 mg by nebulization every 6 (six) hours as needed. 10/09/22   [provider]  albuterol (VENTOLIN HFA) 108 (90 Base) MCG/ACT inhaler Inhale 2 puffs into the lungs every 6 (six) hours as needed for wheezing. 04/07/21 11/30/24  Medina-Vargas, Monina C, NP  aspirin EC 81 MG tablet Take 81 mg by mouth daily.    [provider]  atorvastatin (LIPITOR) 80 MG tablet Take 1 tablet (80 mg total) by mouth daily. 06/28/23   Delma Freeze, FNP  dapagliflozin propanediol (FARXIGA) 10 MG TABS tablet Take 1 tablet (10 mg total) by mouth daily. 06/28/23   Delma Freeze, FNP  furosemide (LASIX) 20 MG tablet Take 1 tablet (20 mg total) by mouth daily. Take 40 mg (2 tablets) on Monday and Friday only. Take Lasix 20 mg (1 tablet) on the other days. 06/28/23   Delma Freeze, FNP  Multiple Vitamins-Minerals (MULTIVITAMIN WITH MINERALS) tablet Take 1 tablet by mouth daily.    [provider]  omeprazole (PRILOSEC) 40 MG capsule TAKE 1 CAPSULE BY MOUTH DAILY USUALLY BEFORE BREAKFAST 04/07/21   Medina-Vargas, Monina C, NP  sacubitril-valsartan (ENTRESTO) 24-26 MG Take 1 tablet by mouth 2 (two) times daily. 12/21/22   Romie Minus, MD  spironolactone (ALDACTONE) 25 MG tablet  Take 0.5 tablets (12.5 mg total) by mouth daily. FOR CHRONIC COMBINED CHF 06/28/23   Delma Freeze, FNP    Physical Exam: Vitals:   07/04/23 0245 07/04/23 0300 07/04/23 0400 07/04/23 0500  BP: (!) 118/53 (!) 110/54 (!) 107/52 (!) 102/37  Pulse: 100 99 90 84  Resp: (!) 30 (!) 26 (!) 27 (!) 22  Temp:    99.3 F (37.4 C)  TempSrc:      SpO2: 97% 98% 98% 99%  Weight:      Height:         Constitutional: NAD, no pallor or diaphoresis  Eyes: PERTLA, lids and conjunctivae normal ENMT: Mucous  membranes are moist. Posterior pharynx clear of any exudate or lesions.   Neck: supple, no masses  Respiratory: Mild tachypnea. Diminished breath sounds bilaterally, prolonged expiratory phase. Coarse rales at left base.    Cardiovascular: S1 & S2 heard, regular rate and rhythm. No extremity edema.  Abdomen: No tenderness, soft. Bowel sounds active.  Musculoskeletal: no clubbing / cyanosis. No joint deformity upper and lower extremities.   Skin: no significant rashes, lesions, ulcers. Warm, dry, well-perfused. Neurologic: CN 2-12 grossly intact. Moving all extremities. Sleeping, wakes to voice and oriented to person, place, and situation.  Psychiatric: Calm. Cooperative.    Labs and Imaging on Admission: I have personally reviewed following labs and imaging studies  CBC: Recent Labs  Lab 07/04/23 0126 07/04/23 0140  WBC 18.9*  --   NEUTROABS 17.8*  --   HGB 14.1 15.0  HCT 44.6 44.0  MCV 99.6  --   PLT 144*  --    Basic Metabolic Panel: Recent Labs  Lab 07/04/23 0126 07/04/23 0140  NA 140 141  K 3.5 3.7  CL 103  --   CO2 26  --   GLUCOSE 155*  --   BUN 16  --   CREATININE 1.38*  --   CALCIUM 8.3*  --    GFR: Estimated Creatinine Clearance: 47.6 mL/min (A) (by C-G formula based on SCr of 1.38 mg/dL (H)). Liver Function Tests: Recent Labs  Lab 07/04/23 0126  AST 19  ALT 12  ALKPHOS 64  BILITOT 1.5*  PROT 6.4*  ALBUMIN 3.2*   No results for input(s): "LIPASE",  "AMYLASE" in the last 168 hours. No results for input(s): "AMMONIA" in the last 168 hours. Coagulation Profile: Recent Labs  Lab 07/04/23 0126  INR 1.1   Cardiac Enzymes: No results for input(s): "CKTOTAL", "CKMB", "CKMBINDEX", "TROPONINI" in the last 168 hours. BNP (last 3 results) No results for input(s): "PROBNP" in the last 8760 hours. HbA1C: No results for input(s): "HGBA1C" in the last 72 hours. CBG: No results for input(s): "GLUCAP" in the last 168 hours. Lipid Profile: No results for input(s): "CHOL", "HDL", "LDLCALC", "TRIG", "CHOLHDL", "LDLDIRECT" in the last 72 hours. Thyroid Function Tests: No results for input(s): "TSH", "T4TOTAL", "FREET4", "T3FREE", "THYROIDAB" in the last 72 hours. Anemia Panel: No results for input(s): "VITAMINB12", "FOLATE", "FERRITIN", "TIBC", "IRON", "RETICCTPCT" in the last 72 hours. Urine analysis:    Component Value Date/Time   COLORURINE YELLOW (A) 02/21/2021 0641   APPEARANCEUR HAZY (A) 02/21/2021 0641   APPEARANCEUR Cloudy 04/21/2011 1418   LABSPEC 1.021 02/21/2021 0641   LABSPEC 1.013 04/21/2011 1418   PHURINE 5.0 02/21/2021 0641   GLUCOSEU NEGATIVE 02/21/2021 0641   GLUCOSEU Negative 04/21/2011 1418   HGBUR MODERATE (A) 02/21/2021 0641   BILIRUBINUR NEGATIVE 02/21/2021 0641   BILIRUBINUR Negative 04/21/2011 1418   KETONESUR 5 (A) 02/21/2021 0641   PROTEINUR 30 (A) 02/21/2021 0641   NITRITE POSITIVE (A) 02/21/2021 0641   LEUKOCYTESUR MODERATE (A) 02/21/2021 0641   LEUKOCYTESUR Negative 04/21/2011 1418   Sepsis Labs: @LABRCNTIP (procalcitonin:4,lacticidven:4) ) Recent Results (from the past 240 hours)  Resp panel by RT-PCR (RSV, Flu A&B, Covid) Anterior Nasal Swab     Status: None   Collection Time: 07/04/23  2:38 AM   Specimen: Anterior Nasal Swab  Result Value Ref Range Status   SARS Coronavirus 2 by RT PCR NEGATIVE NEGATIVE Final   Influenza A by PCR NEGATIVE NEGATIVE Final   Influenza B by PCR NEGATIVE NEGATIVE Final     Comment: (  NOTE) The Xpert Xpress SARS-CoV-2/FLU/RSV plus assay is intended as an aid in the diagnosis of influenza from Nasopharyngeal swab specimens and should not be used as a sole basis for treatment. Nasal washings and aspirates are unacceptable for Xpert Xpress SARS-CoV-2/FLU/RSV testing.  Fact Sheet for Patients: BloggerCourse.com  Fact Sheet for Healthcare Providers: SeriousBroker.it  This test is not yet approved or cleared by the [place] FDA and has been authorized for detection and/or diagnosis of SARS-CoV-2 by FDA under an Emergency Use Authorization (EUA). This EUA will remain in effect (meaning this test can be used) for the duration of the COVID-19 declaration under Section 564(b)(1) of the Act, 21 U.S.C. section 360bbb-3(b)(1), unless the authorization is terminated or revoked.     Resp Syncytial Virus by PCR NEGATIVE NEGATIVE Final    Comment: (NOTE) Fact Sheet for Patients: BloggerCourse.com  Fact Sheet for Healthcare Providers: SeriousBroker.it  This test is not yet approved or cleared by the [place] FDA and has been authorized for detection and/or diagnosis of SARS-CoV-2 by FDA under an Emergency Use Authorization (EUA). This EUA will remain in effect (meaning this test can be used) for the duration of the COVID-19 declaration under Section 564(b)(1) of the Act, 21 U.S.C. section 360bbb-3(b)(1), unless the authorization is terminated or revoked.  Performed at [facility], [address] [place], [place] [address]      Radiological Exams on Admission: DG Chest Port 1 View Result Date: 07/04/2023 CLINICAL DATA:  Questionable sepsis-evaluate for abnormality EXAM: PORTABLE CHEST 1 VIEW COMPARISON:  01/16/2023 FINDINGS: Stable cardiomediastinal silhouette. Aortic atherosclerotic calcification. Retrocardiac atelectasis or pneumonia. Small  left pleural effusion. The right lung is clear. No pneumothorax. No displaced rib fracture. IMPRESSION: Retrocardiac atelectasis or pneumonia and small left pleural effusion. Electronically Signed   By: Minerva Fester M.D.   On: 07/04/2023 02:28    EKG: Independently reviewed. Sinus tachycardia, rate 113, PVCs.   Assessment/Plan   1. Pneumonia; COPD exacerbation; acute on chronic hypoxic respiratory failure  - Culture sputum, check strep pneumo and legionella antigens, start systemic steroid, Rocephin, and azithromycin, continue short-acting bronchodilators, continue supplemental O2   2. Chronic HFmrEF  - EF was 40-45% on echo from August 2024  - Appears compensated  - Diuretics and Entresto held on admission in light of sepsis and low BP readings in ED   - Follow daily weight and I/Os    3. CAD  - No anginal symptoms  - ASA, Lipitor    4. Hx of CVA  - ASA, Lipitor     DVT prophylaxis: Lovenox  Code Status: Full  Level of Care: Level of care: Progressive Family Communication: None present  Disposition Plan:  Patient is from: Home  Anticipated d/c is to: TBD Anticipated d/c date is: 07/08/23  Patient currently: pending improved respiratory status  Consults called: None  Admission status: Inpatient     Briscoe Deutscher, MD [facility]  07/04/2023, 5:36 AM

## 2023-07-04 NOTE — Progress Notes (Addendum)
 PROGRESS NOTE    [name]  UEA:[id number] DOB: 1945-09-01 DOA: 07/04/2023 PCP: Danella Penton, MD   Brief Narrative:  HPI: [name] is a 78 y.o. male with medical history significant for hypertension, hyperlipidemia, COPD, chronic hypoxic respiratory failure, AAA status post endovascular repair, CAD, and chronic HFmrEF who presents with cough, shortness of breath, and chills.   Patient reports that he developed chills and increased cough a couple days ago.  Since then, he has become progressively short of breath.  He was having difficulty catching his breath at rest last night and EMS was called.    Patient reports that his cough has been productive. He denies chest pain, abdominal pain, dysuria, or leg swelling.    He was found by EMS to be in respiratory distress with heart rate 130, respiratory rate 40, blood pressure 86/40, and oxygen saturation 93% on NRB.  He was treated with DuoNeb x 2 prior to arrival in the ED   ED Course: Upon arrival to the ED, patient is found to be febrile 39.3 C and saturating mid 90s on NRB with tachypnea, tachycardia, and systolic blood pressure in the low 100s.  Labs are most notable for creatinine 1.38, WBC 18,900, lactic acid 2.8, and negative respiratory virus panel.  Chest x-ray demonstrates retrocardiac opacity.   Blood cultures were collected in the ED and the patient was treated with 1.5 L of LR, acetaminophen, vancomycin, cefepime, and Flagyl.    Assessment & Plan:   Principal Problem:   Sepsis due to pneumonia [facility]) Active Problems:   Essential hypertension   Coronary artery disease of native artery of native heart with stable angina pectoris (HCC)   COPD exacerbation (HCC)   History of stroke   Chronic combined systolic and diastolic congestive heart failure (HCC)   Acute on chronic respiratory failure with hypoxia (HCC)  Severe sepsis and acute on chronic hypoxic respiratory failure secondary to community-acquired pneumonia and  acute COPD exacerbation, POA: Patient met severe sepsis criteria based on fever 102.7, tachycardia, tachypnea, hypoxia, leukocytosis and lactic acid of 2.8.  Chest x-ray showed retrocardiac atelectasis or pneumonia.  Patient uses 4 L of oxygen constantly at home.  Required nonrebreather upon arrival.  Now back to 4 L.  Still complains of shortness of breath.  On exam, wheezy and rhonchi.  Has been started on Rocephin and Zithromax, prednisone and bronchodilators and I will continue all of those.  Follow cultures.  Repeat lactic acid.  If elevated, provide more IV fluids.   Chronic HFmrEF  EF was 40-45% on echo from August 2024 . Appears compensated.  BNP better than baseline.Diuretics, [name] and Entresto held on admission in light of sepsis and low BP readings in ED .  Low-sodium diet, daily weight and strict I's and O's.  Essential hypertension: Looks like patient during recent hospitalization in December 2024 was discharged on Coreg.  Currently his PTA medications does not show Coreg.  His blood pressure is also on the low side.  Will hold any antihypertensives.   CAD/hyperlipidemia No anginal symptoms  ASA, Lipitor     Hx of CVA  ASA, Lipitor    Acute thrombocytopenia: likely in the setting of severe sepsis.  No bleeding.  Monitor closely.  GERD: PPI.  DVT prophylaxis: enoxaparin (LOVENOX) injection 40 mg Start: 07/04/23 1000   Code Status: Full Code  Family Communication:  None present at bedside.  Plan of care discussed with patient in length and he/she verbalized understanding and agreed  with it.  Status is: Inpatient Remains inpatient appropriate because: Still very sick.  Needs inpatient management.   Estimated body mass index is 23.33 kg/m as calculated from the following:   Height as of this encounter: 6' (1.829 m).   Weight as of this encounter: 78 kg.    Nutritional Assessment: Body mass index is 23.33 kg/m.[name] Seen by dietician.  I agree with the assessment and plan  as outlined below: Nutrition Status:        . Skin Assessment: I have examined the patient's skin and I agree with the wound assessment as performed by the wound care RN as outlined below:    Consultants:  None  Procedures:  None  Antimicrobials:  Anti-infectives (From admission, onward)    Start     Dose/Rate Route Frequency Ordered Stop   07/04/23 1300  cefTRIAXone (ROCEPHIN) 2 g in sodium chloride 0.9 % 100 mL IVPB        2 g 200 mL/hr over 30 Minutes Intravenous Every 24 hours 07/04/23 0536 07/09/23 1259   07/04/23 0800  azithromycin (ZITHROMAX) tablet 500 mg        500 mg Oral Daily 07/04/23 0536 07/09/23 0759   07/04/23 0145  ceFEPIme (MAXIPIME) 2 g in sodium chloride 0.9 % 100 mL IVPB        2 g 200 mL/hr over 30 Minutes Intravenous  Once 07/04/23 0133 07/04/23 0223   07/04/23 0145  metroNIDAZOLE (FLAGYL) IVPB 500 mg        500 mg 100 mL/hr over 60 Minutes Intravenous  Once 07/04/23 0133 07/04/23 0305   07/04/23 0145  vancomycin (VANCOCIN) IVPB 1000 mg/200 mL premix  Status:  Discontinued        1,000 mg 200 mL/hr over 60 Minutes Intravenous  Once 07/04/23 0133 07/04/23 0137   07/04/23 0145  vancomycin (VANCOREADY) IVPB 1500 mg/300 mL        1,500 mg 150 mL/hr over 120 Minutes Intravenous  Once 07/04/23 0137 07/04/23 0610         Subjective: Patient seen and examined, still in the ED.  Complains of feeling a lot better today.  Objective: Vitals:   07/04/23 0500 07/04/23 0600 07/04/23 0630 07/04/23 0951  BP: (!) 102/37 (!) 105/47 (!) 115/54 (!) 109/59  Pulse: 84 88 80 84  Resp: (!) 22 (!) 23 (!) 21 (!) 27  Temp: 99.3 F (37.4 C)   98.3 F (36.8 C)  TempSrc:      SpO2: 99% 99% 99% 98%  Weight:      Height:        Intake/Output Summary (Last 24 hours) at 07/04/2023 1132 Last data filed at 07/04/2023 0223 Gross per 24 hour  Intake 100.22 ml  Output --  Net 100.22 ml   Filed Weights   07/04/23 0121  Weight: 78 kg    Examination:  General  exam: Appears calm and comfortable  Respiratory system: Rhonchi and wheezes bilaterally. Respiratory effort normal. Cardiovascular system: S1 & S2 heard, RRR. No JVD, murmurs, rubs, gallops or clicks. No pedal edema. Gastrointestinal system: Abdomen is nondistended, soft and nontender. No organomegaly or masses felt. Normal bowel sounds heard. Central nervous system: Alert and oriented. No focal neurological deficits. Extremities: Symmetric 5 x 5 power. Skin: No rashes, lesions or ulcers Psychiatry: Judgement and insight appear normal. Mood & affect appropriate.    Data Reviewed: I have personally reviewed following labs and imaging studies  CBC: Recent Labs  Lab 07/04/23 0126 07/04/23 0140  WBC 18.9*  --   NEUTROABS 17.8*  --   HGB 14.1 15.0  HCT 44.6 44.0  MCV 99.6  --   PLT 144*  --    Basic Metabolic Panel: Recent Labs  Lab 07/04/23 0126 07/04/23 0140  NA 140 141  K 3.5 3.7  CL 103  --   CO2 26  --   GLUCOSE 155*  --   BUN 16  --   CREATININE 1.38*  --   CALCIUM 8.3*  --    GFR: Estimated Creatinine Clearance: 47.6 mL/min (A) (by C-G formula based on SCr of 1.38 mg/dL (H)). Liver Function Tests: Recent Labs  Lab 07/04/23 0126  AST 19  ALT 12  ALKPHOS 64  BILITOT 1.5*  PROT 6.4*  ALBUMIN 3.2*   No results for input(s): "LIPASE", "AMYLASE" in the last 168 hours. No results for input(s): "AMMONIA" in the last 168 hours. Coagulation Profile: Recent Labs  Lab 07/04/23 0126  INR 1.1   Cardiac Enzymes: No results for input(s): "CKTOTAL", "CKMB", "CKMBINDEX", "TROPONINI" in the last 168 hours. BNP (last 3 results) No results for input(s): "PROBNP" in the last 8760 hours. HbA1C: No results for input(s): "HGBA1C" in the last 72 hours. CBG: No results for input(s): "GLUCAP" in the last 168 hours. Lipid Profile: No results for input(s): "CHOL", "HDL", "LDLCALC", "TRIG", "CHOLHDL", "LDLDIRECT" in the last 72 hours. Thyroid Function Tests: No results for  input(s): "TSH", "T4TOTAL", "FREET4", "T3FREE", "THYROIDAB" in the last 72 hours. Anemia Panel: No results for input(s): "VITAMINB12", "FOLATE", "FERRITIN", "TIBC", "IRON", "RETICCTPCT" in the last 72 hours. Sepsis Labs: Recent Labs  Lab 07/04/23 0134 07/04/23 0350  LATICACIDVEN 2.8* 2.4*    Recent Results (from the past 240 hours)  Culture, blood (Routine x 2)     Status: None (Preliminary result)   Collection Time: 07/04/23  1:26 AM   Specimen: BLOOD RIGHT WRIST  Result Value Ref Range Status   Specimen Description BLOOD RIGHT WRIST  Final   Special Requests   Final    BOTTLES DRAWN AEROBIC AND ANAEROBIC Blood Culture results may not be optimal due to an inadequate volume of blood received in culture bottles   Culture   Final    NO GROWTH < 12 HOURS Performed at [facility], [address] [place], [place] [address]    Report Status PENDING  Incomplete  Culture, blood (Routine x 2)     Status: None (Preliminary result)   Collection Time: 07/04/23  1:32 AM   Specimen: BLOOD LEFT WRIST  Result Value Ref Range Status   Specimen Description BLOOD LEFT WRIST  Final   Special Requests   Final    BOTTLES DRAWN AEROBIC AND ANAEROBIC Blood Culture adequate volume   Culture   Final    NO GROWTH < 12 HOURS Performed at [facility], [address] [place], [place] [address]    Report Status PENDING  Incomplete  Resp panel by RT-PCR (RSV, Flu A&B, Covid) Anterior Nasal Swab     Status: None   Collection Time: 07/04/23  2:38 AM   Specimen: Anterior Nasal Swab  Result Value Ref Range Status   SARS Coronavirus 2 by RT PCR NEGATIVE NEGATIVE Final   Influenza A by PCR NEGATIVE NEGATIVE Final   Influenza B by PCR NEGATIVE NEGATIVE Final    Comment: (NOTE) The Xpert Xpress SARS-CoV-2/FLU/RSV plus assay is intended as an aid in the diagnosis of influenza from Nasopharyngeal swab specimens and should not  be used as a sole basis for treatment. Nasal washings and aspirates  are unacceptable for Xpert Xpress SARS-CoV-2/FLU/RSV testing.  Fact Sheet for Patients: BloggerCourse.com  Fact Sheet for Healthcare Providers: SeriousBroker.it  This test is not yet approved or cleared by the [place] FDA and has been authorized for detection and/or diagnosis of SARS-CoV-2 by FDA under an Emergency Use Authorization (EUA). This EUA will remain in effect (meaning this test can be used) for the duration of the COVID-19 declaration under Section 564(b)(1) of the Act, 21 U.S.C. section 360bbb-3(b)(1), unless the authorization is terminated or revoked.     Resp Syncytial Virus by PCR NEGATIVE NEGATIVE Final    Comment: (NOTE) Fact Sheet for Patients: BloggerCourse.com  Fact Sheet for Healthcare Providers: SeriousBroker.it  This test is not yet approved or cleared by the [place] FDA and has been authorized for detection and/or diagnosis of SARS-CoV-2 by FDA under an Emergency Use Authorization (EUA). This EUA will remain in effect (meaning this test can be used) for the duration of the COVID-19 declaration under Section 564(b)(1) of the Act, 21 U.S.C. section 360bbb-3(b)(1), unless the authorization is terminated or revoked.  Performed at [facility], [address], [place], [place] [address]      Radiology Studies: DG Chest Port 1 View Result Date: 07/04/2023 CLINICAL DATA:  Questionable sepsis-evaluate for abnormality EXAM: PORTABLE CHEST 1 VIEW COMPARISON:  01/16/2023 FINDINGS: Stable cardiomediastinal silhouette. Aortic atherosclerotic calcification. Retrocardiac atelectasis or pneumonia. Small left pleural effusion. The right lung is clear. No pneumothorax. No displaced rib fracture. IMPRESSION: Retrocardiac atelectasis or pneumonia and small left pleural effusion. Electronically Signed   By: Minerva Fester M.D.   On: 07/04/2023 02:28     Scheduled Meds:  aspirin EC  81 mg Oral Daily   atorvastatin  80 mg Oral Daily   azithromycin  500 mg Oral Daily   enoxaparin (LOVENOX) injection  40 mg Subcutaneous Q24H   methylPREDNISolone (SOLU-MEDROL) injection  125 mg Intravenous Q12H   Followed by   Melene Muller ON 07/05/2023] predniSONE  40 mg Oral Q breakfast   pantoprazole  40 mg Oral Daily   sodium chloride flush  3 mL Intravenous Q12H   Continuous Infusions:  cefTRIAXone (ROCEPHIN)  IV       LOS: 0 days   Hughie Closs, MD [facility]  Total time spent: 40 minutes 07/04/2023, 11:32 AM   *Please note that this is a verbal dictation therefore any spelling or grammatical errors are due to the "Dragon Medical One" system interpretation.  Please page via Amion and do not message via secure chat for urgent patient care matters. Secure chat can be used for non urgent patient care matters.  How to contact the [facility] Attending or Consulting provider 7A - 7P or covering provider during after hours 7P -7A, for this patient?  Check the care team in [facility] and look for a) attending/consulting TRH provider listed and b) the [facility] team listed. Page or secure chat 7A-7P. Log into [email or web address] and use [facility] universal password to access. If you do not have the password, please contact the hospital operator. Locate the [facility] provider you are looking for under [facility] and page to a number that you can be directly reached. If you still have difficulty reaching the provider, please page the [facility] (Director on Call) for the Hospitalists listed on amion for assistance.

## 2023-07-04 NOTE — ED Notes (Signed)
 Blood cultures not obtained by this RN.

## 2023-07-05 DIAGNOSIS — A419 Sepsis, unspecified organism: Secondary | ICD-10-CM | POA: Diagnosis not present

## 2023-07-05 DIAGNOSIS — J189 Pneumonia, unspecified organism: Secondary | ICD-10-CM | POA: Diagnosis not present

## 2023-07-05 LAB — BASIC METABOLIC PANEL
Anion gap: 5 (ref 5–15)
BUN: 21 mg/dL (ref 8–23)
CO2: 29 mmol/L (ref 22–32)
Calcium: 8.2 mg/dL — ABNORMAL LOW (ref 8.9–10.3)
Chloride: 105 mmol/L (ref 98–111)
Creatinine, Ser: 1.17 mg/dL (ref 0.61–1.24)
GFR, Estimated: >60 mL/min (ref >60)
Glucose, Bld: 134 mg/dL — ABNORMAL HIGH (ref 70–99)
Potassium: 3.6 mmol/L (ref 3.5–5.1)
Sodium: 139 mmol/L (ref 135–145)

## 2023-07-05 LAB — CBC
HCT: 37.3 % — ABNORMAL LOW (ref 39.0–52.0)
Hemoglobin: 11.7 g/dL — ABNORMAL LOW (ref 13.0–17.0)
MCH: 30.4 pg (ref 26.0–34.0)
MCHC: 31.4 g/dL (ref 30.0–36.0)
MCV: 96.9 fL (ref 80.0–100.0)
Platelets: 125 10*3/uL — ABNORMAL LOW (ref 150–400)
RBC: 3.85 MIL/uL — ABNORMAL LOW (ref 4.22–5.81)
RDW: 13 % (ref 11.5–15.5)
WBC: 18.2 10*3/uL — ABNORMAL HIGH (ref 4.0–10.5)
nRBC: 0 % (ref 0.0–0.2)

## 2023-07-05 LAB — MAGNESIUM: Magnesium: 1.7 mg/dL (ref 1.7–2.4)

## 2023-07-05 MED ORDER — METOPROLOL TARTRATE 5 MG/5ML IV SOLN: 5.0000 mg | INTRAVENOUS | Status: DC | PRN

## 2023-07-05 MED ORDER — ARFORMOTEROL TARTRATE 15 MCG/2ML IN NEBU
15.0000 ug | INHALATION_SOLUTION | Freq: Two times a day (BID) | RESPIRATORY_TRACT | Status: DC
  Administered 2023-07-05 – 2023-07-11 (×13): 15 ug via RESPIRATORY_TRACT
  Filled 2023-07-05 (×14): qty 2

## 2023-07-05 MED ORDER — REVEFENACIN 175 MCG/3ML IN SOLN
175.0000 ug | Freq: Every day | RESPIRATORY_TRACT | Status: DC
  Administered 2023-07-05 – 2023-07-11 (×7): 175 ug via RESPIRATORY_TRACT
  Filled 2023-07-05 (×7): qty 3

## 2023-07-05 MED ORDER — HYDRALAZINE HCL 20 MG/ML IJ SOLN: 10.0000 mg | INTRAMUSCULAR | Status: DC | PRN

## 2023-07-05 MED ORDER — TRAZODONE HCL 50 MG PO TABS
50.0000 mg | ORAL_TABLET | Freq: Every evening | ORAL | Status: DC | PRN
  Administered 2023-07-05 – 2023-07-10 (×4): 50 mg via ORAL
  Filled 2023-07-05 (×4): qty 1

## 2023-07-05 MED ORDER — GUAIFENESIN 100 MG/5ML PO LIQD
5.0000 mL | ORAL | Status: DC | PRN
  Administered 2023-07-05 – 2023-07-09 (×2): 5 mL via ORAL
  Filled 2023-07-05 (×2): qty 5

## 2023-07-05 NOTE — TOC Initial Note (Signed)
 Transition of Care [facility]) - Initial/Assessment Note    Patient Details  Name: [name] MRN: [id number] Date of Birth: 24-Jun-1945  Transition of Care [facility]) CM/SW Contact:    Lamonte Sakai, Student-Social Work Phone Number: 07/05/2023, 8:44 AM  Clinical Narrative:                  Pt admitted from home. No current TOC Needs, please consult as needs arise.       Patient Goals and CMS Choice            Expected Discharge Plan and Services       Living arrangements for the past 2 months: Single Family Home                                      Prior Living Arrangements/Services Living arrangements for the past 2 months: Single Family Home                     Activities of Daily Living   ADL Screening (condition at time of admission) Independently performs ADLs?: Yes (appropriate for developmental age) Is the patient deaf or have difficulty hearing?: No Does the patient have difficulty seeing, even when wearing glasses/contacts?: Yes Does the patient have difficulty concentrating, remembering, or making decisions?: Yes  Permission Sought/Granted                  Emotional Assessment       Orientation: : Oriented to Self, Oriented to Place, Oriented to  Time, Oriented to Situation Alcohol / Substance Use: Tobacco Use    Admission diagnosis:  Sepsis (HCC) [A41.9] Sepsis, due to unspecified organism, unspecified whether acute organ dysfunction present [facility]) [A41.9] Patient Active Problem List   Diagnosis Date Noted   Sepsis due to pneumonia (HCC) 07/04/2023   Thrombocytopenia (HCC) 01/17/2023   Acute on chronic hypoxic respiratory failure (HCC) 12/13/2022   COPD (chronic obstructive pulmonary disease) (HCC) 12/12/2022   Acute hypoxemic respiratory failure (HCC) 12/12/2022   Acute on chronic combined systolic and diastolic CHF (congestive heart failure) (HCC) 04/24/2021   Anemia 04/24/2021   Acute on chronic respiratory failure with  hypoxia (HCC) 04/24/2021   Incarcerated inguinal hernia 03/18/2021   Chronic combined systolic and diastolic congestive heart failure (HCC)    Intraparenchymal hemorrhage of brain (HCC) 02/26/2021   SDH (subdural hematoma) (HCC) 02/21/2021   History of stroke 02/21/2021   Elevated troponin 02/21/2021   Other spondylosis with radiculopathy, lumbar region 09/04/2018   Centrilobular emphysema (HCC) 07/19/2018   Dilated cardiomyopathy (HCC) 07/19/2018   Coronary artery disease of native artery of native heart with stable angina pectoris (HCC) 01/20/2018   AAA (abdominal aortic aneurysm) without rupture (HCC) 10/30/2017   COPD exacerbation (HCC) 04/03/2015   Barrett's esophagus 05/20/2014   Constipation 05/20/2014   Arthritis of hand 05/07/2014   Pulmonary nodule 11/06/2011   Chronic respiratory failure with hypoxia (HCC) 04/24/2011   Tobacco abuse 02/12/2011   CAD, NATIVE VESSEL/HLD 10/18/2008   COLONIC POLYPS 08/05/2008   Hyperlipidemia 08/05/2008   Essential hypertension 08/05/2008   GERD 08/05/2008   PCP:  Danella Penton, MD Pharmacy:   [facility] - [place], [place] - [address] [place] [address] Phone: [phone] Fax: [phone]  [facility] [place], [place] - [address] [place]  [place] [address] Phone: [phone] Fax: [phone]     Social Drivers of Health (SDOH) Social History: SDOH Screenings   Food Insecurity: No Food Insecurity (07/04/2023)  Housing: Low Risk  (07/04/2023)  Transportation Needs: No Transportation Needs (07/04/2023)  Utilities: Not At Risk (07/04/2023)  Depression (PHQ2-9): Low Risk  (09/03/2018)  Financial Resource Strain: Low Risk  (01/03/2023)   Received from [facility]  Social Connections: Moderately Isolated (07/04/2023)  Stress: No Stress Concern Present (08/21/2018)  Tobacco Use: High Risk (07/04/2023)   SDOH Interventions:      Readmission Risk Interventions    04/28/2021    9:32 AM  Readmission Risk Prevention Plan  Transportation Screening Complete  PCP or Specialist Appt within 3-5 Days Complete  HRI or Home Care Consult Complete  Social Work Consult for Recovery Care Planning/Counseling Complete  Palliative Care Screening Not Applicable  Medication Review Oceanographer) Complete

## 2023-07-05 NOTE — Progress Notes (Signed)
 PROGRESS NOTE    [name]  ZOX:[id number] DOB: 1946/03/14 DOA: 07/04/2023 PCP: Danella Penton, MD    Brief Narrative:   78 year old with history of HTN, HLD, COPD, chronic hypoxia, AAA status post endovascular repair, CAD, chronic CHF with reduced EF 45% admitted for cough, shortness of breath.  Upon evaluation concerns of community-acquired pneumonia and COPD exacerbation.  Assessment & Plan:  Principal Problem:   Sepsis due to pneumonia [facility]) Active Problems:   Essential hypertension   Coronary artery disease of native artery of native heart with stable angina pectoris (HCC)   COPD exacerbation (HCC)   History of stroke   Chronic combined systolic and diastolic congestive heart failure (HCC)   Acute on chronic respiratory failure with hypoxia (HCC)    Severe sepsis and acute on chronic hypoxic respiratory failure secondary to community-acquired pneumonia and acute COPD exacerbation, POA: Initially patient met sepsis criteria which are slowly stabilizing.  Wean down oxygen.  COVID/flu/RSV, RVP-negative - Continue bronchodilators, I-S/flutter valve - Steroids, Rocephin/azithromycin - Lactic acidosis has resolved    Chronic HFmrEF  EF was 40-45% on echo from August 2024 .  Overall compensated in nature.  Currently home medications are on hold until sepsis physiology stabilizes.  For now on heart healthy diet will fluid restriction    Essential hypertension:  Holding antihypertensives.  IV as needed   CAD/hyperlipidemia Denies chest pain.  On aspirin and statin   Hx of CVA  ASA, Lipitor     Acute thrombocytopenia:  likely in the setting of severe sepsis.  No bleeding.  Monitor closely.   GERD: PPI.  D/OT  DVT prophylaxis: Lovenox    Code Status: Full Code Family Communication:   Status is: Inpatient Remains inpatient appropriate because: Continue hospital stay for management of CAP/COPD exacerbation    Subjective: Feeling better this morning after  breathing treatment.  Tells me is ambulatory with the help of walker   Examination:  General exam: Appears calm and comfortable  Respiratory system: Clear to auscultation. Respiratory effort normal. Cardiovascular system: S1 & S2 heard, RRR. No JVD, murmurs, rubs, gallops or clicks. No pedal edema. Gastrointestinal system: Abdomen is nondistended, soft and nontender. No organomegaly or masses felt. Normal bowel sounds heard. Central nervous system: Alert and oriented. No focal neurological deficits. Extremities: Symmetric 5 x 5 power. Skin: No rashes, lesions or ulcers Psychiatry: Judgement and insight appear normal. Mood & affect appropriate.                Diet Orders (From admission, onward)     Start     Ordered   07/04/23 0536  Diet Heart Room service appropriate? Yes; Fluid consistency: Thin  Diet effective now       Question Answer Comment  Room service appropriate? Yes   Fluid consistency: Thin      07/04/23 0536            Objective: Vitals:   07/05/23 0452 07/05/23 0745 07/05/23 0800 07/05/23 0848  BP: (!) 120/54  109/74   Pulse: 71  85   Resp: 15  15   Temp: 98 F (36.7 C)  97.9 F (36.6 C)   TempSrc: Oral  Oral   SpO2: 100%  90% 98%  Weight:  78.5 kg    Height:        Intake/Output Summary (Last 24 hours) at 07/05/2023 1126 Last data filed at 07/04/2023 1753 Gross per 24 hour  Intake 1000.68 ml  Output --  Net 1000.68 ml  Filed Weights   07/04/23 0121 07/04/23 2312 07/05/23 0745  Weight: 78 kg 81.4 kg 78.5 kg    Scheduled Meds:  arformoterol  15 mcg Nebulization BID   aspirin EC  81 mg Oral Daily   atorvastatin  80 mg Oral Daily   azithromycin  500 mg Oral Daily   enoxaparin (LOVENOX) injection  40 mg Subcutaneous Q24H   pantoprazole  40 mg Oral Daily   predniSONE  40 mg Oral Q breakfast   revefenacin  175 mcg Nebulization Daily   sodium chloride flush  3 mL Intravenous Q12H   Continuous Infusions:  cefTRIAXone (ROCEPHIN)  IV  Stopped (07/04/23 1439)    Nutritional status     Body mass index is 23.47 kg/m.  Data Reviewed:   CBC: Recent Labs  Lab 07/04/23 0126 07/04/23 0140 07/05/23 0501  WBC 18.9*  --  18.2*  NEUTROABS 17.8*  --   --   HGB 14.1 15.0 11.7*  HCT 44.6 44.0 37.3*  MCV 99.6  --  96.9  PLT 144*  --  125*   Basic Metabolic Panel: Recent Labs  Lab 07/04/23 0126 07/04/23 0140 07/05/23 0501  NA 140 141 139  K 3.5 3.7 3.6  CL 103  --  105  CO2 26  --  29  GLUCOSE 155*  --  134*  BUN 16  --  21  CREATININE 1.38*  --  1.17  CALCIUM 8.3*  --  8.2*  MG  --   --  1.7   GFR: Estimated Creatinine Clearance: 56.2 mL/min (by C-G formula based on SCr of 1.17 mg/dL). Liver Function Tests: Recent Labs  Lab 07/04/23 0126  AST 19  ALT 12  ALKPHOS 64  BILITOT 1.5*  PROT 6.4*  ALBUMIN 3.2*   No results for input(s): "LIPASE", "AMYLASE" in the last 168 hours. No results for input(s): "AMMONIA" in the last 168 hours. Coagulation Profile: Recent Labs  Lab 07/04/23 0126  INR 1.1   Cardiac Enzymes: No results for input(s): "CKTOTAL", "CKMB", "CKMBINDEX", "TROPONINI" in the last 168 hours. BNP (last 3 results) No results for input(s): "PROBNP" in the last 8760 hours. HbA1C: No results for input(s): "HGBA1C" in the last 72 hours. CBG: No results for input(s): "GLUCAP" in the last 168 hours. Lipid Profile: No results for input(s): "CHOL", "HDL", "LDLCALC", "TRIG", "CHOLHDL", "LDLDIRECT" in the last 72 hours. Thyroid Function Tests: No results for input(s): "TSH", "T4TOTAL", "FREET4", "T3FREE", "THYROIDAB" in the last 72 hours. Anemia Panel: No results for input(s): "VITAMINB12", "FOLATE", "FERRITIN", "TIBC", "IRON", "RETICCTPCT" in the last 72 hours. Sepsis Labs: Recent Labs  Lab 07/04/23 0134 07/04/23 0350 07/04/23 1155 07/04/23 1800  LATICACIDVEN 2.8* 2.4* 2.2* 1.1    Recent Results (from the past 240 hours)  Culture, blood (Routine x 2)     Status: None (Preliminary  result)   Collection Time: 07/04/23  1:26 AM   Specimen: BLOOD RIGHT WRIST  Result Value Ref Range Status   Specimen Description BLOOD RIGHT WRIST  Final   Special Requests   Final    BOTTLES DRAWN AEROBIC AND ANAEROBIC Blood Culture results may not be optimal due to an inadequate volume of blood received in culture bottles   Culture   Final    NO GROWTH 1 DAY Performed at [facility], [address] [place], [place] [address]    Report Status PENDING  Incomplete  Culture, blood (Routine x 2)     Status: None (Preliminary result)   Collection  Time: 07/04/23  1:32 AM   Specimen: BLOOD LEFT WRIST  Result Value Ref Range Status   Specimen Description BLOOD LEFT WRIST  Final   Special Requests   Final    BOTTLES DRAWN AEROBIC AND ANAEROBIC Blood Culture adequate volume   Culture   Final    NO GROWTH 1 DAY Performed at [facility], [address], [place], [place] [address]    Report Status PENDING  Incomplete  Resp panel by RT-PCR (RSV, Flu A&B, Covid) Anterior Nasal Swab     Status: None   Collection Time: 07/04/23  2:38 AM   Specimen: Anterior Nasal Swab  Result Value Ref Range Status   SARS Coronavirus 2 by RT PCR NEGATIVE NEGATIVE Final   Influenza A by PCR NEGATIVE NEGATIVE Final   Influenza B by PCR NEGATIVE NEGATIVE Final    Comment: (NOTE) The Xpert Xpress SARS-CoV-2/FLU/RSV plus assay is intended as an aid in the diagnosis of influenza from Nasopharyngeal swab specimens and should not be used as a sole basis for treatment. Nasal washings and aspirates are unacceptable for Xpert Xpress SARS-CoV-2/FLU/RSV testing.  Fact Sheet for Patients: BloggerCourse.com  Fact Sheet for Healthcare Providers: SeriousBroker.it  This test is not yet approved or cleared by the [place] FDA and has been authorized for detection and/or diagnosis of SARS-CoV-2 by FDA under an Emergency Use Authorization (EUA). This EUA  will remain in effect (meaning this test can be used) for the duration of the COVID-19 declaration under Section 564(b)(1) of the Act, 21 U.S.C. section 360bbb-3(b)(1), unless the authorization is terminated or revoked.     Resp Syncytial Virus by PCR NEGATIVE NEGATIVE Final    Comment: (NOTE) Fact Sheet for Patients: BloggerCourse.com  Fact Sheet for Healthcare Providers: SeriousBroker.it  This test is not yet approved or cleared by the [place] FDA and has been authorized for detection and/or diagnosis of SARS-CoV-2 by FDA under an Emergency Use Authorization (EUA). This EUA will remain in effect (meaning this test can be used) for the duration of the COVID-19 declaration under Section 564(b)(1) of the Act, 21 U.S.C. section 360bbb-3(b)(1), unless the authorization is terminated or revoked.  Performed at [facility], [address] [place], [place] [address]   Respiratory (~20 pathogens) panel by PCR     Status: None   Collection Time: 07/04/23  8:58 AM   Specimen: Nasopharyngeal Swab; Respiratory  Result Value Ref Range Status   Adenovirus NOT DETECTED NOT DETECTED Final   Coronavirus 229E NOT DETECTED NOT DETECTED Final    Comment: (NOTE) The Coronavirus on the Respiratory Panel, DOES NOT test for the novel  Coronavirus (2019 nCoV)    Coronavirus HKU1 NOT DETECTED NOT DETECTED Final   Coronavirus NL63 NOT DETECTED NOT DETECTED Final   Coronavirus OC43 NOT DETECTED NOT DETECTED Final   Metapneumovirus NOT DETECTED NOT DETECTED Final   Rhinovirus / Enterovirus NOT DETECTED NOT DETECTED Final   Influenza A NOT DETECTED NOT DETECTED Final   Influenza B NOT DETECTED NOT DETECTED Final   Parainfluenza Virus 1 NOT DETECTED NOT DETECTED Final   Parainfluenza Virus 2 NOT DETECTED NOT DETECTED Final   Parainfluenza Virus 3 NOT DETECTED NOT DETECTED Final   Parainfluenza Virus 4 NOT DETECTED NOT DETECTED Final    Respiratory Syncytial Virus NOT DETECTED NOT DETECTED Final   Bordetella pertussis NOT DETECTED NOT DETECTED Final   Bordetella Parapertussis NOT DETECTED NOT DETECTED Final   Chlamydophila pneumoniae NOT DETECTED NOT DETECTED Final  Mycoplasma pneumoniae NOT DETECTED NOT DETECTED Final    Comment: Performed at [facility], [address] [place], [place] [address]         Radiology Studies: DG Chest Port 1 View Result Date: 07/04/2023 CLINICAL DATA:  Questionable sepsis-evaluate for abnormality EXAM: PORTABLE CHEST 1 VIEW COMPARISON:  01/16/2023 FINDINGS: Stable cardiomediastinal silhouette. Aortic atherosclerotic calcification. Retrocardiac atelectasis or pneumonia. Small left pleural effusion. The right lung is clear. No pneumothorax. No displaced rib fracture. IMPRESSION: Retrocardiac atelectasis or pneumonia and small left pleural effusion. Electronically Signed   By: Minerva Fester M.D.   On: 07/04/2023 02:28           LOS: 1 day   Time spent= 35 mins    Miguel Rota, MD [facility]  If 7PM-7AM, please contact night-coverage  07/05/2023, 11:26 AM

## 2023-07-05 NOTE — Hospital Course (Addendum)
 Brief Narrative:   78 year old with history of HTN, HLD, COPD, chronic hypoxia, AAA status post endovascular repair, CAD, chronic CHF with reduced EF 45% admitted for cough, shortness of breath.  Upon evaluation concerns of community-acquired pneumonia and COPD exacerbation.  Assessment & Plan:  Principal Problem:   Sepsis due to pneumonia [facility]) Active Problems:   Essential hypertension   Coronary artery disease of native artery of native heart with stable angina pectoris (HCC)   COPD exacerbation (HCC)   History of stroke   Chronic combined systolic and diastolic congestive heart failure (HCC)   Acute on chronic respiratory failure with hypoxia (HCC)    Severe sepsis and acute on chronic hypoxic respiratory failure secondary to community-acquired pneumonia and acute COPD exacerbation, POA: Initially patient met sepsis criteria which are slowly stabilizing.  Wean down oxygen.  COVID/flu/RSV, RVP-negative - Continue bronchodilators, I-S/flutter valve - Steroids, Rocephin/azithromycin - Lactic acidosis has resolved    Chronic HFmrEF  EF was 40-45% on echo from August 2024 .  Overall compensated in nature.  Currently home medications are on hold until sepsis physiology stabilizes.  For now on heart healthy diet will fluid restriction    Essential hypertension:  Holding antihypertensives.  IV as needed   CAD/hyperlipidemia Denies chest pain.  On aspirin and statin   Hx of CVA  ASA, Lipitor     Acute thrombocytopenia:  likely in the setting of severe sepsis.  No bleeding.  Monitor closely.   GERD: PPI.  D/OT  DVT prophylaxis: Lovenox    Code Status: Full Code Family Communication:   Status is: Inpatient Remains inpatient appropriate because: Continue hospital stay for management of CAP/COPD exacerbation    Subjective: Feeling better this morning after breathing treatment.  Tells me is ambulatory with the help of walker   Examination:  General exam: Appears calm and  comfortable  Respiratory system: Clear to auscultation. Respiratory effort normal. Cardiovascular system: S1 & S2 heard, RRR. No JVD, murmurs, rubs, gallops or clicks. No pedal edema. Gastrointestinal system: Abdomen is nondistended, soft and nontender. No organomegaly or masses felt. Normal bowel sounds heard. Central nervous system: Alert and oriented. No focal neurological deficits. Extremities: Symmetric 5 x 5 power. Skin: No rashes, lesions or ulcers Psychiatry: Judgement and insight appear normal. Mood & affect appropriate.

## 2023-07-05 NOTE — Plan of Care (Signed)

## 2023-07-06 DIAGNOSIS — J189 Pneumonia, unspecified organism: Secondary | ICD-10-CM | POA: Diagnosis not present

## 2023-07-06 DIAGNOSIS — A419 Sepsis, unspecified organism: Secondary | ICD-10-CM | POA: Diagnosis not present

## 2023-07-06 LAB — BASIC METABOLIC PANEL
Anion gap: 8 (ref 5–15)
BUN: 24 mg/dL — ABNORMAL HIGH (ref 8–23)
CO2: 29 mmol/L (ref 22–32)
Calcium: 8.3 mg/dL — ABNORMAL LOW (ref 8.9–10.3)
Chloride: 104 mmol/L (ref 98–111)
Creatinine, Ser: 1.1 mg/dL (ref 0.61–1.24)
GFR, Estimated: >60 mL/min (ref >60)
Glucose, Bld: 105 mg/dL — ABNORMAL HIGH (ref 70–99)
Potassium: 3.4 mmol/L — ABNORMAL LOW (ref 3.5–5.1)
Sodium: 141 mmol/L (ref 135–145)

## 2023-07-06 LAB — CBC
HCT: 35.6 % — ABNORMAL LOW (ref 39.0–52.0)
Hemoglobin: 11.6 g/dL — ABNORMAL LOW (ref 13.0–17.0)
MCH: 31.4 pg (ref 26.0–34.0)
MCHC: 32.6 g/dL (ref 30.0–36.0)
MCV: 96.5 fL (ref 80.0–100.0)
Platelets: 145 10*3/uL — ABNORMAL LOW (ref 150–400)
RBC: 3.69 MIL/uL — ABNORMAL LOW (ref 4.22–5.81)
RDW: 13 % (ref 11.5–15.5)
WBC: 14.4 10*3/uL — ABNORMAL HIGH (ref 4.0–10.5)
nRBC: 0 % (ref 0.0–0.2)

## 2023-07-06 LAB — MAGNESIUM: Magnesium: 1.9 mg/dL (ref 1.7–2.4)

## 2023-07-06 MED ORDER — POTASSIUM CHLORIDE CRYS ER 20 MEQ PO TBCR
40.0000 meq | EXTENDED_RELEASE_TABLET | Freq: Once | ORAL | Status: AC
  Administered 2023-07-06: 40 meq via ORAL
  Filled 2023-07-06: qty 2

## 2023-07-06 NOTE — Evaluation (Signed)
 Physical Therapy Evaluation  Patient Details Name: [name] MRN: [id number] DOB: [birth year] Today's Date: 07/06/2023  History of Present Illness  Pt is a 78 yo male that presents to [facility] on 07/04/23 for cough and SOB, work up for sepsis secondary to pneumonia. PMH of COPD stage II, anxiety, stroke, neuromuscular disorder,depression, HTN, prostate cancer s/p seed implantation, HLD, lumbar disc disease, CAD, AAA , intraparenchymal hemorrhage in 02/2021, , nephrolithiasis, GERD, tobacco use and chronic ischemic heart failure.   Clinical Impression  Pt admitted with above diagnosis. Pt currently with functional limitations due to the deficits listed below (see PT Problem List). At the time of PT eval pt was able to perform transfers and ambulation with gross CGA to supervision for safety and RW for support. Pt on 4L/min supplemental O2 throughout the session, and pt reports he uses supplemental O2 at all times at home. Sats remained 91-93% on 4L/min O2 during functional mobility, dropping to 83% upon return to the room and seated rest break. Pt cued for pursed lip breathing and sats returned >90% within 1 minute. Pt will benefit from acute skilled PT to increase their independence and safety with mobility to allow discharge.           If plan is discharge home, recommend the following: A little help with walking and/or transfers;A little help with bathing/dressing/bathroom;Assistance with cooking/housework;Assist for transportation;Help with stairs or ramp for entrance   Can travel by private vehicle        Equipment Recommendations None recommended by PT  Recommendations for Other Services       Functional Status Assessment Patient has had a recent decline in their functional status and demonstrates the ability to make significant improvements in function in a reasonable and predictable amount of time.     Precautions / Restrictions Precautions Precautions: Fall Recall of  Precautions/Restrictions: Intact Restrictions Weight Bearing Restrictions Per Provider Order: No      Mobility  Bed Mobility               General bed mobility comments: Pt was received sitting up in recliner.    Transfers Overall transfer level: Needs assistance Equipment used: Rolling walker (2 wheels) Transfers: Sit to/from Stand Sit to Stand: Contact guard assist           General transfer comment: Pt demonstrated proper hand placement on seated surface for safety. No assist to power up to full stand but close guard provided for safety.    Ambulation/Gait Ambulation/Gait assistance: Contact guard assist, Supervision Gait Distance (Feet): 150 Feet Assistive device: Rolling walker (2 wheels) Gait Pattern/deviations: Step-through pattern, Decreased stride length, Trunk flexed, Narrow base of support Gait velocity: Decreased Gait velocity interpretation: <1.31 ft/sec, indicative of household ambulator   General Gait Details: Slow but generally steady with RW for support. Pt on 4L/min supplemental O2 throughout ambulation and sats remained 91-93%. CGA progressing to supervision for safety by end of gait training.  Stairs            Wheelchair Mobility     Tilt Bed    Modified Rankin (Stroke Patients Only)       Balance Overall balance assessment: Needs assistance Sitting-balance support: No upper extremity supported, Feet supported Sitting balance-Leahy Scale: Good     Standing balance support: Bilateral upper extremity supported, During functional activity Standing balance-Leahy Scale: Poor  Pertinent Vitals/Pain Pain Assessment Pain Assessment: No/denies pain    Home Living Family/patient expects to be discharged to:: Private residence Living Arrangements: Alone Available Help at Discharge: Family;Available PRN/intermittently (two sons come in the evenings until 9pm) Type of Home: Mobile home Home  Access: Stairs to enter;Ramped entrance Entrance Stairs-Rails: Can reach both Entrance Stairs-Number of Steps: 4   Home Layout: One level Home Equipment: Agricultural consultant (2 wheels);Cane - single point Additional Comments: step sons bring food and drive to appointments if needed, checks on him nightly    Prior Function Prior Level of Function : Independent/Modified Independent             Mobility Comments: using rolling walker, no falls reported ADLs Comments: Pt uses RW for functional mobility and sons bring meals and assist with IADLs as needed     Extremity/Trunk Assessment   Upper Extremity Assessment Upper Extremity Assessment: Overall WFL for tasks assessed    Lower Extremity Assessment Lower Extremity Assessment: Generalized weakness (Mild; functional)    Cervical / Trunk Assessment Cervical / Trunk Assessment: Other exceptions Cervical / Trunk Exceptions: Forward head posture with rounded shoulders  Communication   Communication Communication: No apparent difficulties    Cognition Arousal: Alert Behavior During Therapy: WFL for tasks assessed/performed                             Following commands: Intact       Cueing Cueing Techniques: Verbal cues     General Comments General comments (skin integrity, edema, etc.): VSS on RA    Exercises     Assessment/Plan    PT Assessment Patient needs continued PT services  PT Problem List Decreased strength;Decreased range of motion;Decreased activity tolerance;Decreased balance;Decreased mobility;Decreased knowledge of use of DME;Decreased safety awareness;Decreased knowledge of precautions       PT Treatment Interventions DME instruction;Gait training;Functional mobility training;Therapeutic activities;Therapeutic exercise;Balance training;Patient/family education    PT Goals (Current goals can be found in the Care Plan section)  Acute Rehab PT Goals Patient Stated Goal: Home at d/c, feel  better PT Goal Formulation: With patient Time For Goal Achievement: 07/13/23 Potential to Achieve Goals: Good    Frequency Min 2X/week     Co-evaluation               AM-PAC PT "6 Clicks" Mobility  Outcome Measure Help needed turning from your back to your side while in a flat bed without using bedrails?: A Little Help needed moving from lying on your back to sitting on the side of a flat bed without using bedrails?: A Little Help needed moving to and from a bed to a chair (including a wheelchair)?: A Little Help needed standing up from a chair using your arms (e.g., wheelchair or bedside chair)?: A Little Help needed to walk in hospital room?: A Little Help needed climbing 3-5 steps with a railing? : A Little 6 Click Score: 18    End of Session Equipment Utilized During Treatment: Gait belt Activity Tolerance: Patient tolerated treatment well Patient left: in chair;with call bell/phone within reach;with chair alarm set Nurse Communication: Mobility status PT Visit Diagnosis: Unsteadiness on feet (R26.81);Difficulty in walking, not elsewhere classified (R26.2)    Time: 1610-9604 PT Time Calculation (min) (ACUTE ONLY): 27 min   Charges:   PT Evaluation $PT Eval Moderate Complexity: 1 Mod PT Treatments $Gait Training: 8-22 mins PT General Charges $$ ACUTE PT VISIT: 1 Visit  Conni Slipper, PT, DPT Acute Rehabilitation Services Secure Chat Preferred Office: [phone]   Marylynn Pearson 07/06/2023, 12:34 PM

## 2023-07-06 NOTE — Care Management Important Message (Signed)
 Important Message  Patient Details  Name: [name] MRN: [id number] Date of Birth: Jan 23, 1946   Important Message Given:  Yes - Medicare IM     Dorena Bodo 07/06/2023, 3:16 PM

## 2023-07-06 NOTE — Plan of Care (Signed)

## 2023-07-06 NOTE — TOC Progression Note (Addendum)
 Transition of Care [facility] - Progression Note    Patient Details  Name: [name] MRN: [id number] Date of Birth: 1945-10-06  Transition of Care [facility]) CM/SW Contact  Gordy Clement, RN Phone Number: 07/06/2023, 3:21 PM  Clinical Narrative:    Patient being recommended HHPT only. Iantha Fallen has accepted the referral- AVS updated  Patient has portable O2 and states his Daughter in [place] will bring to hospital when its time to DC.  Family to transport          Expected Discharge Plan and Services       Living arrangements for the past 2 months: Single Family Home                                       Social Determinants of Health (SDOH) Interventions SDOH Screenings   Food Insecurity: No Food Insecurity (07/04/2023)  Housing: Low Risk  (07/04/2023)  Transportation Needs: No Transportation Needs (07/04/2023)  Utilities: Not At Risk (07/04/2023)  Depression (PHQ2-9): Low Risk  (09/03/2018)  Financial Resource Strain: Low Risk  (01/03/2023)   Received from [facility]  Social Connections: Moderately Isolated (07/04/2023)  Stress: No Stress Concern Present (08/21/2018)  Tobacco Use: High Risk (07/04/2023)    Readmission Risk Interventions    04/28/2021    9:32 AM  Readmission Risk Prevention Plan  Transportation Screening Complete  PCP or Specialist Appt within 3-5 Days Complete  HRI or Home Care Consult Complete  Social Work Consult for Recovery Care Planning/Counseling Complete  Palliative Care Screening Not Applicable  Medication Review Oceanographer) Complete

## 2023-07-06 NOTE — Progress Notes (Signed)
   07/06/23 1425  Mobility  Activity Ambulated with assistance in hallway  Level of Assistance Contact guard assist, steadying assist (MinA STS)  Assistive Device Front wheel walker  Distance Ambulated (ft) 125 ft  Activity Response Tolerated fair  Mobility Referral Yes  Mobility visit 1 Mobility  Mobility Specialist Start Time (ACUTE ONLY) 1400  Mobility Specialist Stop Time (ACUTE ONLY) 1425  Mobility Specialist Time Calculation (min) (ACUTE ONLY) 25 min   Mobility Specialist: Progress Note Nurse requested Mobility Specialist to perform oxygen saturation test with pt which includes removing pt from oxygen both at rest and while ambulating.  Below are the results from that testing.     Patient Saturations on Room Air at Rest = spO2 88%  Patient Saturations on Room Air while Ambulating = sp02 83%.   Patient Saturations on 4 Liters of oxygen while Ambulating = sp02 91%  At end of testing pt left in room on 4 Liters of oxygen.  Reported results to nurse.    Pt agreeable to mobility session - received in chair. C/o back pain sitting in chair. Returned to bed with all needs met - call bell within reach. Bed alarm on.   Barnie Mort, BS Mobility Specialist Please contact via SecureChat or  Rehab office at [phone].

## 2023-07-06 NOTE — TOC Progression Note (Signed)
 Transition of Care [facility]) - Progression Note    Patient Details  Name: [name] MRN: [id number] Date of Birth: 1945-06-16  Transition of Care [facility]) CM/SW Contact  Mearl Latin, LCSW Phone Number: 07/06/2023, 4:37 PM  Clinical Narrative:    Notified patient of hospital stay approval at the request of Crystal with [employer].         Expected Discharge Plan and Services       Living arrangements for the past 2 months: Single Family Home                                       Social Determinants of Health (SDOH) Interventions SDOH Screenings   Food Insecurity: No Food Insecurity (07/04/2023)  Housing: Low Risk  (07/04/2023)  Transportation Needs: No Transportation Needs (07/04/2023)  Utilities: Not At Risk (07/04/2023)  Depression (PHQ2-9): Low Risk  (09/03/2018)  Financial Resource Strain: Low Risk  (01/03/2023)   Received from [facility]  Social Connections: Moderately Isolated (07/04/2023)  Stress: No Stress Concern Present (08/21/2018)  Tobacco Use: High Risk (07/04/2023)    Readmission Risk Interventions    04/28/2021    9:32 AM  Readmission Risk Prevention Plan  Transportation Screening Complete  PCP or Specialist Appt within 3-5 Days Complete  HRI or Home Care Consult Complete  Social Work Consult for Recovery Care Planning/Counseling Complete  Palliative Care Screening Not Applicable  Medication Review Oceanographer) Complete

## 2023-07-06 NOTE — Plan of Care (Signed)
  Problem: Education: Goal: Knowledge of General Education information will improve Description: Including pain rating scale, medication(s)/side effects and non-pharmacologic comfort measures Outcome: Progressing   Problem: Health Behavior/Discharge Planning: Goal: Ability to manage health-related needs will improve Outcome: Progressing   Problem: Clinical Measurements: Goal: Ability to maintain clinical measurements within normal limits will improve Outcome: Progressing Goal: Will remain free from infection Outcome: Progressing Goal: Diagnostic test results will improve Outcome: Progressing Goal: Respiratory complications will improve Outcome: Progressing Goal: Cardiovascular complication will be avoided Outcome: Progressing   Problem: Activity: Goal: Risk for activity intolerance will decrease Outcome: Progressing   Problem: Nutrition: Goal: Adequate nutrition will be maintained Outcome: Progressing   Problem: Coping: Goal: Level of anxiety will decrease Outcome: Progressing   Problem: Elimination: Goal: Will not experience complications related to bowel motility Outcome: Progressing Goal: Will not experience complications related to urinary retention Outcome: Progressing   Problem: Pain Managment: Goal: General experience of comfort will improve and/or be controlled Outcome: Progressing   Problem: Safety: Goal: Ability to remain free from injury will improve Outcome: Progressing   Problem: Skin Integrity: Goal: Risk for impaired skin integrity will decrease Outcome: Progressing   Problem: Education: Goal: Knowledge of disease or condition will improve Outcome: Progressing Goal: Knowledge of the prescribed therapeutic regimen will improve Outcome: Progressing Goal: Individualized Educational Video(s) Outcome: Progressing   Problem: Activity: Goal: Ability to tolerate increased activity will improve Outcome: Progressing Goal: Will verbalize the  importance of balancing activity with adequate rest periods Outcome: Progressing   Problem: Respiratory: Goal: Ability to maintain a clear airway will improve Outcome: Progressing Goal: Levels of oxygenation will improve Outcome: Progressing Goal: Ability to maintain adequate ventilation will improve Outcome: Progressing   Problem: Clinical Measurements: Goal: Ability to maintain a body temperature in the normal range will improve Outcome: Progressing

## 2023-07-06 NOTE — Evaluation (Addendum)
 Occupational Therapy Evaluation Patient Details Name: [name] MRN: [id number] DOB: 04-25-1945 Today's Date: 07/06/2023   History of Present Illness   Pt is a 78 yo male that presents to [facility] on 07/04/23 for cough and SOB, work up for sepsis secondary to pneumonia. PMH of COPD stage II, anxiety, stroke, neuromuscular disorder,depression, HTN, prostate cancer s/p seed implantation, HLD, lumbar disc disease, CAD, AAA , intraparenchymal hemorrhage in 02/2021, , nephrolithiasis, GERD, tobacco use and chronic ischemic heart failure.     Clinical Impressions Pt admitted for above, PTA pt lived alone and reports being Mod I for mobility using RW and ind in ADLs, his son's provide meals and perform iADLs. Pt currently denying any SOB and is ambulatory short distances in room with CGA + RW, no signs of SOB noted. Pt able to complete ADLs with CGA to setup A, OT to continue following pt acutely to continue to progress pt while in acute stay and educate on energy conservation prn. Anticipate no post acute OT needed at DC.      If plan is discharge home, recommend the following:   Assistance with cooking/housework;Assist for transportation     Functional Status Assessment   Patient has had a recent decline in their functional status and demonstrates the ability to make significant improvements in function in a reasonable and predictable amount of time.     Equipment Recommendations   None recommended by OT (pt has rec DME)     Recommendations for Other Services         Precautions/Restrictions   Precautions Precautions: Fall Recall of Precautions/Restrictions: Intact Restrictions Weight Bearing Restrictions Per Provider Order: No     Mobility Bed Mobility Overal bed mobility: Needs Assistance Bed Mobility: Supine to Sit     Supine to sit: Supervision          Transfers Overall transfer level: Needs assistance Equipment used: Rolling walker (2 wheels) Transfers:  Sit to/from Stand Sit to Stand: Contact guard assist                  Balance Overall balance assessment: Needs assistance Sitting-balance support: No upper extremity supported, Feet supported Sitting balance-Leahy Scale: Good     Standing balance support: Bilateral upper extremity supported, During functional activity Standing balance-Leahy Scale: Poor                             ADL either performed or assessed with clinical judgement   ADL Overall ADL's : Needs assistance/impaired Eating/Feeding: Independent;Sitting   Grooming: Standing;Contact guard assist   Upper Body Bathing: Sitting;Set up   Lower Body Bathing: Sitting/lateral leans;Set up   Upper Body Dressing : Set up;Sitting   Lower Body Dressing: Set up;Sitting/lateral leans   Toilet Transfer: Contact guard assist;Ambulation   Toileting- Clothing Manipulation and Hygiene: Contact guard assist;Sit to/from stand       Functional mobility during ADLs: Contact guard assist;Rolling walker (2 wheels)       Vision Baseline Vision/History: 1 Wears glasses       Perception         Praxis         Pertinent Vitals/Pain Pain Assessment Pain Assessment: No/denies pain     Extremity/Trunk Assessment Upper Extremity Assessment Upper Extremity Assessment: Overall WFL for tasks assessed   Lower Extremity Assessment Lower Extremity Assessment: Generalized weakness (Mild; functional)   Cervical / Trunk Assessment Cervical / Trunk Assessment: Other exceptions Cervical / Trunk  Exceptions: Forward head posture with rounded shoulders   Communication Communication Communication: No apparent difficulties   Cognition Arousal: Alert Behavior During Therapy: WFL for tasks assessed/performed Cognition: No apparent impairments                               Following commands: Intact       Cueing  General Comments   Cueing Techniques: Verbal cues  VSS on 4L while ambulatory  in room   Exercises     Shoulder Instructions      Home Living Family/patient expects to be discharged to:: Private residence Living Arrangements: Alone Available Help at Discharge: Family;Available PRN/intermittently (two sons come in the evenings until 9pm) Type of Home: Mobile home Home Access: Stairs to enter;Ramped entrance Entrance Stairs-Number of Steps: 4 Entrance Stairs-Rails: Can reach both Home Layout: One level     Bathroom Shower/Tub: Producer, television/film/video: Standard     Home Equipment: Agricultural consultant (2 wheels);Cane - single point   Additional Comments: step sons bring food and drive to appointments if needed, checks on him nightly      Prior Functioning/Environment Prior Level of Function : Independent/Modified Independent             Mobility Comments: using rolling walker, no falls reported ADLs Comments: Pt uses RW for functional mobility and sons bring meals and assist with IADLs as needed    OT Problem List: Impaired balance (sitting and/or standing)   OT Treatment/Interventions: Self-care/ADL training;Therapeutic activities;Therapeutic exercise;Patient/family education;Balance training      OT Goals(Current goals can be found in the care plan section)   Acute Rehab OT Goals Patient Stated Goal: To go home OT Goal Formulation: With patient Time For Goal Achievement: 07/20/23 Potential to Achieve Goals: Good ADL Goals Pt Will Perform Grooming: with modified independence;standing Pt Will Perform Lower Body Bathing: with modified independence;sit to/from stand Pt Will Transfer to Toilet: with modified independence;ambulating Pt Will Perform Toileting - Clothing Manipulation and hygiene: with modified independence;sit to/from stand   OT Frequency:  Min 2X/week    Co-evaluation              AM-PAC OT "6 Clicks" Daily Activity     Outcome Measure Help from another person eating meals?: None Help from another person taking  care of personal grooming?: A Little Help from another person toileting, which includes using toliet, bedpan, or urinal?: A Little Help from another person bathing (including washing, rinsing, drying)?: A Little Help from another person to put on and taking off regular upper body clothing?: A Little Help from another person to put on and taking off regular lower body clothing?: A Little 6 Click Score: 19   End of Session Equipment Utilized During Treatment: Gait belt;Rolling walker (2 wheels) Nurse Communication: Mobility status  Activity Tolerance: Patient tolerated treatment well Patient left: in chair;with call bell/phone within reach;with chair alarm set  OT Visit Diagnosis: Unsteadiness on feet (R26.81);Other (comment) (SOB)                Time: 2440-1027 OT Time Calculation (min): 18 min Charges:  OT General Charges $OT Visit: 1 Visit OT Evaluation $OT Eval Low Complexity: 1 Low  07/06/2023  AB, OTR/L  Acute Rehabilitation Services  Office: [phone]   Tristan Schroeder 07/06/2023, 12:37 PM

## 2023-07-06 NOTE — Progress Notes (Signed)
 PROGRESS NOTE    [name]  ZOX:[id number] DOB: November 30, 1945 DOA: 07/04/2023 PCP: Danella Penton, MD    Brief Narrative:   78 year old with history of HTN, HLD, COPD, chronic hypoxia, AAA status post endovascular repair, CAD, chronic CHF with reduced EF 45% admitted for cough, shortness of breath.  Upon evaluation concerns of community-acquired pneumonia and COPD exacerbation.  Slowly improving on bronchodilators, steroids, Rocephin/azithromycin.  Assessment & Plan:  Principal Problem:   Sepsis due to pneumonia [facility]) Active Problems:   Essential hypertension   Coronary artery disease of native artery of native heart with stable angina pectoris (HCC)   COPD exacerbation (HCC)   History of stroke   Chronic combined systolic and diastolic congestive heart failure (HCC)   Acute on chronic respiratory failure with hypoxia (HCC)    Severe sepsis and acute on chronic hypoxic respiratory failure secondary to community-acquired pneumonia and acute COPD exacerbation, POA: Initially patient met sepsis criteria which are slowly stabilizing.  Wean down oxygen.  COVID/flu/RSV, RVP-negative - Continue bronchodilators, I-S/flutter valve - Steroids, Rocephin/azithromycin -Ordered ambulatory pulse ox    Chronic HFmrEF  EF was 40-45% on echo from August 2024 .  Overall compensated in nature.  Currently home medications are on hold until sepsis physiology stabilizes.  For now on heart healthy diet will fluid restriction   Hypokalemia - As needed repletion   Essential hypertension:  Holding antihypertensives.  IV as needed   CAD/hyperlipidemia Denies chest pain.  On aspirin and statin   Hx of CVA  ASA, Lipitor     Acute thrombocytopenia:  likely in the setting of severe sepsis.  No bleeding.  Monitor closely.   GERD: PPI.  PT/OT-pending  DVT prophylaxis: Lovenox    Code Status: Full Code Family Communication:   Status is: Inpatient Remains inpatient appropriate because: Continue  hospital stay for management of CAP/COPD exacerbation.  Hopefully discharge in next 24-48 hours    Subjective:  Seen at bedside, improving but still feels weak with exertional dyspnea.  Examination:  General exam: Appears calm and comfortable  Respiratory system: Mild breath sounds bilaterally Cardiovascular system: S1 & S2 heard, RRR. No JVD, murmurs, rubs, gallops or clicks. No pedal edema. Gastrointestinal system: Abdomen is nondistended, soft and nontender. No organomegaly or masses felt. Normal bowel sounds heard. Central nervous system: Alert and oriented. No focal neurological deficits. Extremities: Symmetric 5 x 5 power. Skin: No rashes, lesions or ulcers Psychiatry: Judgement and insight appear normal. Mood & affect appropriate.                Diet Orders (From admission, onward)     Start     Ordered   07/04/23 0536  Diet Heart Room service appropriate? Yes; Fluid consistency: Thin  Diet effective now       Question Answer Comment  Room service appropriate? Yes   Fluid consistency: Thin      07/04/23 0536            Objective: Vitals:   07/06/23 0000 07/06/23 0409 07/06/23 0629 07/06/23 0730  BP: 113/75 117/60  130/62  Pulse: 65 61  69  Resp: 19 16  15   Temp: 98.2 F (36.8 C) 98 F (36.7 C)  97.9 F (36.6 C)  TempSrc: Oral Oral  Oral  SpO2: 99% 93%  96%  Weight:   78.2 kg   Height:        Intake/Output Summary (Last 24 hours) at 07/06/2023 1150 Last data filed at 07/05/2023 2133 Gross per  24 hour  Intake 201.12 ml  Output --  Net 201.12 ml   Filed Weights   07/04/23 2312 07/05/23 0745 07/06/23 0629  Weight: 81.4 kg 78.5 kg 78.2 kg    Scheduled Meds:  arformoterol  15 mcg Nebulization BID   aspirin EC  81 mg Oral Daily   atorvastatin  80 mg Oral Daily   azithromycin  500 mg Oral Daily   enoxaparin (LOVENOX) injection  40 mg Subcutaneous Q24H   pantoprazole  40 mg Oral Daily   predniSONE  40 mg Oral Q breakfast   revefenacin   175 mcg Nebulization Daily   sodium chloride flush  3 mL Intravenous Q12H   Continuous Infusions:  cefTRIAXone (ROCEPHIN)  IV Stopped (07/05/23 1514)    Nutritional status     Body mass index is 23.38 kg/m.  Data Reviewed:   CBC: Recent Labs  Lab 07/04/23 0126 07/04/23 0140 07/05/23 0501 07/06/23 0400  WBC 18.9*  --  18.2* 14.4*  NEUTROABS 17.8*  --   --   --   HGB 14.1 15.0 11.7* 11.6*  HCT 44.6 44.0 37.3* 35.6*  MCV 99.6  --  96.9 96.5  PLT 144*  --  125* 145*   Basic Metabolic Panel: Recent Labs  Lab 07/04/23 0126 07/04/23 0140 07/05/23 0501 07/06/23 0400  NA 140 141 139 141  K 3.5 3.7 3.6 3.4*  CL 103  --  105 104  CO2 26  --  29 29  GLUCOSE 155*  --  134* 105*  BUN 16  --  21 24*  CREATININE 1.38*  --  1.17 1.10  CALCIUM 8.3*  --  8.2* 8.3*  MG  --   --  1.7 1.9   GFR: Estimated Creatinine Clearance: 59.8 mL/min (by C-G formula based on SCr of 1.1 mg/dL). Liver Function Tests: Recent Labs  Lab 07/04/23 0126  AST 19  ALT 12  ALKPHOS 64  BILITOT 1.5*  PROT 6.4*  ALBUMIN 3.2*   No results for input(s): "LIPASE", "AMYLASE" in the last 168 hours. No results for input(s): "AMMONIA" in the last 168 hours. Coagulation Profile: Recent Labs  Lab 07/04/23 0126  INR 1.1   Cardiac Enzymes: No results for input(s): "CKTOTAL", "CKMB", "CKMBINDEX", "TROPONINI" in the last 168 hours. BNP (last 3 results) No results for input(s): "PROBNP" in the last 8760 hours. HbA1C: No results for input(s): "HGBA1C" in the last 72 hours. CBG: No results for input(s): "GLUCAP" in the last 168 hours. Lipid Profile: No results for input(s): "CHOL", "HDL", "LDLCALC", "TRIG", "CHOLHDL", "LDLDIRECT" in the last 72 hours. Thyroid Function Tests: No results for input(s): "TSH", "T4TOTAL", "FREET4", "T3FREE", "THYROIDAB" in the last 72 hours. Anemia Panel: No results for input(s): "VITAMINB12", "FOLATE", "FERRITIN", "TIBC", "IRON", "RETICCTPCT" in the last 72  hours. Sepsis Labs: Recent Labs  Lab 07/04/23 0134 07/04/23 0350 07/04/23 1155 07/04/23 1800  LATICACIDVEN 2.8* 2.4* 2.2* 1.1    Recent Results (from the past 240 hours)  Culture, blood (Routine x 2)     Status: None (Preliminary result)   Collection Time: 07/04/23  1:26 AM   Specimen: BLOOD RIGHT WRIST  Result Value Ref Range Status   Specimen Description BLOOD RIGHT WRIST  Final   Special Requests   Final    BOTTLES DRAWN AEROBIC AND ANAEROBIC Blood Culture results may not be optimal due to an inadequate volume of blood received in culture bottles   Culture   Final    NO GROWTH 2 DAYS Performed at  [facility], [address] [place], [place] [address]    Report Status PENDING  Incomplete  Culture, blood (Routine x 2)     Status: None (Preliminary result)   Collection Time: 07/04/23  1:32 AM   Specimen: BLOOD LEFT WRIST  Result Value Ref Range Status   Specimen Description BLOOD LEFT WRIST  Final   Special Requests   Final    BOTTLES DRAWN AEROBIC AND ANAEROBIC Blood Culture adequate volume   Culture   Final    NO GROWTH 2 DAYS Performed at [facility], [address], [place], [place] [address]    Report Status PENDING  Incomplete  Resp panel by RT-PCR (RSV, Flu A&B, Covid) Anterior Nasal Swab     Status: None   Collection Time: 07/04/23  2:38 AM   Specimen: Anterior Nasal Swab  Result Value Ref Range Status   SARS Coronavirus 2 by RT PCR NEGATIVE NEGATIVE Final   Influenza A by PCR NEGATIVE NEGATIVE Final   Influenza B by PCR NEGATIVE NEGATIVE Final    Comment: (NOTE) The Xpert Xpress SARS-CoV-2/FLU/RSV plus assay is intended as an aid in the diagnosis of influenza from Nasopharyngeal swab specimens and should not be used as a sole basis for treatment. Nasal washings and aspirates are unacceptable for Xpert Xpress SARS-CoV-2/FLU/RSV testing.  Fact Sheet for Patients: BloggerCourse.com  Fact Sheet for Healthcare  Providers: SeriousBroker.it  This test is not yet approved or cleared by the [place] FDA and has been authorized for detection and/or diagnosis of SARS-CoV-2 by FDA under an Emergency Use Authorization (EUA). This EUA will remain in effect (meaning this test can be used) for the duration of the COVID-19 declaration under Section 564(b)(1) of the Act, 21 U.S.C. section 360bbb-3(b)(1), unless the authorization is terminated or revoked.     Resp Syncytial Virus by PCR NEGATIVE NEGATIVE Final    Comment: (NOTE) Fact Sheet for Patients: BloggerCourse.com  Fact Sheet for Healthcare Providers: SeriousBroker.it  This test is not yet approved or cleared by the [place] FDA and has been authorized for detection and/or diagnosis of SARS-CoV-2 by FDA under an Emergency Use Authorization (EUA). This EUA will remain in effect (meaning this test can be used) for the duration of the COVID-19 declaration under Section 564(b)(1) of the Act, 21 U.S.C. section 360bbb-3(b)(1), unless the authorization is terminated or revoked.  Performed at [facility], [address] [place], [place] [address]   Respiratory (~20 pathogens) panel by PCR     Status: None   Collection Time: 07/04/23  8:58 AM   Specimen: Nasopharyngeal Swab; Respiratory  Result Value Ref Range Status   Adenovirus NOT DETECTED NOT DETECTED Final   Coronavirus 229E NOT DETECTED NOT DETECTED Final    Comment: (NOTE) The Coronavirus on the Respiratory Panel, DOES NOT test for the novel  Coronavirus (2019 nCoV)    Coronavirus HKU1 NOT DETECTED NOT DETECTED Final   Coronavirus NL63 NOT DETECTED NOT DETECTED Final   Coronavirus OC43 NOT DETECTED NOT DETECTED Final   Metapneumovirus NOT DETECTED NOT DETECTED Final   Rhinovirus / Enterovirus NOT DETECTED NOT DETECTED Final   Influenza A NOT DETECTED NOT DETECTED Final   Influenza B NOT DETECTED  NOT DETECTED Final   Parainfluenza Virus 1 NOT DETECTED NOT DETECTED Final   Parainfluenza Virus 2 NOT DETECTED NOT DETECTED Final   Parainfluenza Virus 3 NOT DETECTED NOT DETECTED Final   Parainfluenza Virus 4 NOT DETECTED NOT DETECTED Final  Respiratory Syncytial Virus NOT DETECTED NOT DETECTED Final   Bordetella pertussis NOT DETECTED NOT DETECTED Final   Bordetella Parapertussis NOT DETECTED NOT DETECTED Final   Chlamydophila pneumoniae NOT DETECTED NOT DETECTED Final   Mycoplasma pneumoniae NOT DETECTED NOT DETECTED Final    Comment: Performed at [facility], [address], [place], [place] [address]         Radiology Studies: No results found.         LOS: 2 days   Time spent= 35 mins    Miguel Rota, MD [facility]  If 7PM-7AM, please contact night-coverage  07/06/2023, 11:50 AM

## 2023-07-07 ENCOUNTER — Inpatient Hospital Stay (HOSPITAL_COMMUNITY)

## 2023-07-07 ENCOUNTER — Other Ambulatory Visit (HOSPITAL_COMMUNITY): Payer: Self-pay

## 2023-07-07 DIAGNOSIS — J189 Pneumonia, unspecified organism: Secondary | ICD-10-CM | POA: Diagnosis not present

## 2023-07-07 DIAGNOSIS — A419 Sepsis, unspecified organism: Secondary | ICD-10-CM | POA: Diagnosis not present

## 2023-07-07 LAB — CBC WITH DIFFERENTIAL/PLATELET
Abs Immature Granulocytes: 0.02 10*3/uL (ref 0.00–0.07)
Basophils Absolute: 0 10*3/uL (ref 0.0–0.1)
Basophils Relative: 0 %
Eosinophils Absolute: 0 10*3/uL (ref 0.0–0.5)
Eosinophils Relative: 0 %
HCT: 35 % — ABNORMAL LOW (ref 39.0–52.0)
Hemoglobin: 11.3 g/dL — ABNORMAL LOW (ref 13.0–17.0)
Immature Granulocytes: 0 %
Lymphocytes Relative: 9 %
Lymphs Abs: 0.8 10*3/uL (ref 0.7–4.0)
MCH: 31.3 pg (ref 26.0–34.0)
MCHC: 32.3 g/dL (ref 30.0–36.0)
MCV: 97 fL (ref 80.0–100.0)
Monocytes Absolute: 0.4 10*3/uL (ref 0.1–1.0)
Monocytes Relative: 5 %
Neutro Abs: 8.3 10*3/uL — ABNORMAL HIGH (ref 1.7–7.7)
Neutrophils Relative %: 86 %
Platelets: 137 10*3/uL — ABNORMAL LOW (ref 150–400)
RBC: 3.61 MIL/uL — ABNORMAL LOW (ref 4.22–5.81)
RDW: 12.8 % (ref 11.5–15.5)
WBC: 9.6 10*3/uL (ref 4.0–10.5)
nRBC: 0 % (ref 0.0–0.2)

## 2023-07-07 LAB — BASIC METABOLIC PANEL
Anion gap: 6 (ref 5–15)
BUN: 24 mg/dL — ABNORMAL HIGH (ref 8–23)
CO2: 29 mmol/L (ref 22–32)
Calcium: 8.3 mg/dL — ABNORMAL LOW (ref 8.9–10.3)
Chloride: 105 mmol/L (ref 98–111)
Creatinine, Ser: 1.1 mg/dL (ref 0.61–1.24)
GFR, Estimated: >60 mL/min (ref >60)
Glucose, Bld: 93 mg/dL (ref 70–99)
Potassium: 3.8 mmol/L (ref 3.5–5.1)
Sodium: 140 mmol/L (ref 135–145)

## 2023-07-07 LAB — BLOOD GAS, VENOUS
Acid-Base Excess: 4.3 mmol/L — ABNORMAL HIGH (ref 0.0–2.0)
Bicarbonate: 31.8 mmol/L — ABNORMAL HIGH (ref 20.0–28.0)
Drawn by: 70571
O2 Saturation: 83.4 %
Patient temperature: 36.5
pCO2, Ven: 58 mmHg (ref 44–60)
pH, Ven: 7.35 (ref 7.25–7.43)
pO2, Ven: 51 mmHg — ABNORMAL HIGH (ref 32–45)

## 2023-07-07 LAB — PHOSPHORUS: Phosphorus: 2.4 mg/dL — ABNORMAL LOW (ref 2.5–4.6)

## 2023-07-07 LAB — BRAIN NATRIURETIC PEPTIDE: B Natriuretic Peptide: 1463.7 pg/mL — ABNORMAL HIGH (ref 0.0–100.0)

## 2023-07-07 LAB — MAGNESIUM: Magnesium: 1.9 mg/dL (ref 1.7–2.4)

## 2023-07-07 MED ORDER — SACUBITRIL-VALSARTAN 24-26 MG PO TABS
1.0000 | ORAL_TABLET | Freq: Two times a day (BID) | ORAL | Status: DC
  Administered 2023-07-08 – 2023-07-11 (×5): 1 via ORAL
  Filled 2023-07-07 (×7): qty 1

## 2023-07-07 MED ORDER — POTASSIUM PHOSPHATES 15 MMOLE/5ML IV SOLN
30.0000 mmol | Freq: Once | INTRAVENOUS | Status: AC
  Administered 2023-07-07: 30 mmol via INTRAVENOUS
  Filled 2023-07-07: qty 10

## 2023-07-07 MED ORDER — FUROSEMIDE 10 MG/ML IJ SOLN
40.0000 mg | Freq: Once | INTRAMUSCULAR | Status: AC
  Administered 2023-07-07: 40 mg via INTRAVENOUS
  Filled 2023-07-07: qty 4

## 2023-07-07 MED ORDER — SPIRONOLACTONE 12.5 MG HALF TABLET
12.5000 mg | ORAL_TABLET | Freq: Every day | ORAL | Status: DC
  Administered 2023-07-07 – 2023-07-11 (×5): 12.5 mg via ORAL
  Filled 2023-07-07 (×5): qty 1

## 2023-07-07 MED ORDER — CEPHALEXIN 500 MG PO CAPS
500.0000 mg | ORAL_CAPSULE | Freq: Three times a day (TID) | ORAL | 0 refills | Status: DC
  Filled 2023-07-07: qty 12, 4d supply, fill #0

## 2023-07-07 MED ORDER — TIOTROPIUM BROMIDE MONOHYDRATE 18 MCG IN CAPS
18.0000 ug | ORAL_CAPSULE | Freq: Every day | RESPIRATORY_TRACT | 0 refills | Status: AC
  Filled 2023-07-07: qty 30, 30d supply, fill #0

## 2023-07-07 MED ORDER — IPRATROPIUM-ALBUTEROL 0.5-2.5 (3) MG/3ML IN SOLN
RESPIRATORY_TRACT | 0 refills | Status: AC
  Filled 2023-07-07: qty 360, 30d supply, fill #0

## 2023-07-07 MED ORDER — METHYLPREDNISOLONE 4 MG PO TBPK
ORAL_TABLET | ORAL | 0 refills | Status: DC
  Filled 2023-07-07: qty 21, 7d supply, fill #0

## 2023-07-07 MED ORDER — AZITHROMYCIN 500 MG PO TABS
500.0000 mg | ORAL_TABLET | Freq: Every day | ORAL | 0 refills | Status: DC
  Filled 2023-07-07: qty 4, 4d supply, fill #0

## 2023-07-07 NOTE — Progress Notes (Signed)
 Mobility Specialist: Progress Note   07/07/23 1607  Mobility  Activity Ambulated with assistance in hallway  Level of Assistance Contact guard assist, steadying assist  Assistive Device Front wheel walker  Distance Ambulated (ft) 150 ft  Activity Response Tolerated well  Mobility Referral Yes  Mobility visit 1 Mobility  Mobility Specialist Start Time (ACUTE ONLY) 1512  Mobility Specialist Stop Time (ACUTE ONLY) 1539  Mobility Specialist Time Calculation (min) (ACUTE ONLY) 27 min    During Mobility: SpO2 85-92% 4LO2  Pt was agreeable to mobility session - received in bed. CG for bed mobility, light minA for STS, Cg for ambulation. Ambulated in the hallway on 4LO2. Desat 2x to mid 80s, but quickly recovered to SpO2 91-92% with standing break and cues for PLB. Returned to room without fault. Left in bed with all needs met, call bell in reach.   Maurene Capes Mobility Specialist Please contact via SecureChat or Rehab office at [phone]

## 2023-07-07 NOTE — Progress Notes (Addendum)
 PROGRESS NOTE    [name]  JWJ:[id number] DOB: [birth year] DOA: 07/04/2023 PCP: Danella Penton, MD    Brief Narrative:   78 year old with history of HTN, HLD, COPD, chronic hypoxia, AAA status post endovascular repair, CAD, chronic CHF with reduced EF 45% admitted for cough, shortness of breath.  Upon evaluation concerns of community-acquired pneumonia and COPD exacerbation.  Slowly improving on bronchodilators, steroids, Rocephin/azithromycin.  Assessment & Plan:   Severe sepsis and acute on chronic hypoxic respiratory failure secondary to community-acquired pneumonia and acute COPD exacerbation, POA: He appears to have left-sided community-acquired pneumonia, seen and cleared by speech, COVID/flu/RSV, RVP-negativ, Continue bronchodilators, I-S/flutter valve, counseled to quit smoking, continue empiric antibiotics which include Rocephin and azithromycin, cultures thus far negative continue to follow.  He is on 4 to 6 L of oxygen at home at baseline, encouraged him to sit in chair use I-S and flutter valve for pulmonary toiletry, advance activity and titrate down oxygen.  Sepsis pathophysiology has resolved.  SpO2: 95 % O2 Flow Rate (L/min): 6 L/min   Chronic HFmrEF  EF was 40-45% on echo from August 2024 .  Overall compensated in nature.  Currently home medications are on hold until sepsis physiology stabilizes.  For now on heart healthy diet will fluid restriction, dose of IV Lasix on 07/07/2023.  Cautiously resume home CHF medications.  Hypokalemia, hypophosphatemia - As needed repletion   Essential hypertension:  Improved Home medications will be resumed.  Monitor diuretic dose closely.   CAD/hyperlipidemia Denies chest pain.  On aspirin and statin   Hx of CVA  ASA, Lipitor     Ongoing smoking.  Counseled to quit.    Acute thrombocytopenia:  likely in the setting of severe sepsis.  No bleeding.  Monitor closely.   GERD: PPI.  PT/OT-pending  DVT prophylaxis:  Lovenox    Code Status: Full Code Family Communication:   Status is: Inpatient    Subjective:  Patient in bed, appears comfortable, denies any headache, no fever, no chest pain or pressure, improving shortness of breath , no abdominal pain. No new focal weakness.  Vitals:   07/07/23 0045 07/07/23 0328 07/07/23 0500 07/07/23 0800  BP:  (!) 116/58  124/61  Pulse:  63  60  Temp:  (!) 97.5 F (36.4 C)  97.6 F (36.4 C)  Resp:  18  18  Height:      Weight:   78 kg   SpO2: 95% 94%  95%  TempSrc:  Oral  Oral  BMI (Calculated):   23.32     Examination:  Awake Alert, No new F.N deficits, Normal affect K[place]m.AT,PERRAL Supple Neck, No JVD,   Symmetrical Chest wall movement, moderate air movement bilaterally with coarse bilateral breath sounds RRR,No Gallops, Rubs or new Murmurs,  +ve B.Sounds, Abd Soft, No tenderness,   No Cyanosis, Clubbing or edema     Diet Orders (From admission, onward)     Start     Ordered   07/04/23 0536  Diet Heart Room service appropriate? Yes; Fluid consistency: Thin  Diet effective now       Question Answer Comment  Room service appropriate? Yes   Fluid consistency: Thin      07/04/23 0536            Data Review:   Inpatient Medications  Scheduled Meds:  arformoterol  15 mcg Nebulization BID   aspirin EC  81 mg Oral Daily   atorvastatin  80 mg Oral Daily   azithromycin  500 mg Oral Daily   enoxaparin (LOVENOX) injection  40 mg Subcutaneous Q24H   furosemide  40 mg Intravenous Once   pantoprazole  40 mg Oral Daily   predniSONE  40 mg Oral Q breakfast   revefenacin  175 mcg Nebulization Daily   [START ON 07/08/2023] sacubitril-valsartan  1 tablet Oral BID   sodium chloride flush  3 mL Intravenous Q12H   spironolactone  12.5 mg Oral Daily   Continuous Infusions:  cefTRIAXone (ROCEPHIN)  IV 2 g (07/06/23 1402)   potassium PHOSPHATE IVPB (in mmol) 30 mmol (07/07/23 0906)   PRN Meds:.acetaminophen **OR** acetaminophen, guaiFENesin,  hydrALAZINE, ipratropium-albuterol, metoprolol tartrate, ondansetron **OR** ondansetron (ZOFRAN) IV, oxyCODONE, senna-docusate, traZODone  DVT Prophylaxis  enoxaparin (LOVENOX) injection 40 mg Start: 07/04/23 1000  Recent Labs  Lab 07/04/23 0126 07/04/23 0140 07/05/23 0501 07/06/23 0400 07/07/23 0538  WBC 18.9*  --  18.2* 14.4* 9.6  HGB 14.1 15.0 11.7* 11.6* 11.3*  HCT 44.6 44.0 37.3* 35.6* 35.0*  PLT 144*  --  125* 145* 137*  MCV 99.6  --  96.9 96.5 97.0  MCH 31.5  --  30.4 31.4 31.3  MCHC 31.6  --  31.4 32.6 32.3  RDW 12.8  --  13.0 13.0 12.8  LYMPHSABS 0.4*  --   --   --  0.8  MONOABS 0.8  --   --   --  0.4  EOSABS 0.0  --   --   --  0.0  BASOSABS 0.0  --   --   --  0.0    Recent Labs  Lab 07/04/23 0126 07/04/23 0134 07/04/23 0140 07/04/23 0254 07/04/23 0350 07/04/23 1155 07/04/23 1800 07/05/23 0501 07/06/23 0400 07/07/23 0538  NA 140  --  141  --   --   --   --  139 141 140  K 3.5  --  3.7  --   --   --   --  3.6 3.4* 3.8  CL 103  --   --   --   --   --   --  105 104 105  CO2 26  --   --   --   --   --   --  29 29 29   ANIONGAP 11  --   --   --   --   --   --  5 8 6   GLUCOSE 155*  --   --   --   --   --   --  134* 105* 93  BUN 16  --   --   --   --   --   --  21 24* 24*  CREATININE 1.38*  --   --   --   --   --   --  1.17 1.10 1.10  AST 19  --   --   --   --   --   --   --   --   --   ALT 12  --   --   --   --   --   --   --   --   --   ALKPHOS 64  --   --   --   --   --   --   --   --   --   BILITOT 1.5*  --   --   --   --   --   --   --   --   --  ALBUMIN 3.2*  --   --   --   --   --   --   --   --   --   LATICACIDVEN  --  2.8*  --   --  2.4* 2.2* 1.1  --   --   --   INR 1.1  --   --   --   --   --   --   --   --   --   BNP  --   --   --  384.6*  --   --   --   --   --  1,463.7*  MG  --   --   --   --   --   --   --  1.7 1.9 1.9  PHOS  --   --   --   --   --   --   --   --   --  2.4*  CALCIUM 8.3*  --   --   --   --   --   --  8.2* 8.3* 8.3*       Recent Labs  Lab 07/04/23 0126 07/04/23 0134 07/04/23 0254 07/04/23 0350 07/04/23 1155 07/04/23 1800 07/05/23 0501 07/06/23 0400 07/07/23 0538  LATICACIDVEN  --  2.8*  --  2.4* 2.2* 1.1  --   --   --   INR 1.1  --   --   --   --   --   --   --   --   BNP  --   --  384.6*  --   --   --   --   --  1,463.7*  MG  --   --   --   --   --   --  1.7 1.9 1.9  CALCIUM 8.3*  --   --   --   --   --  8.2* 8.3* 8.3*     Micro Results Recent Results (from the past 240 hours)  Culture, blood (Routine x 2)     Status: None (Preliminary result)   Collection Time: 07/04/23  1:26 AM   Specimen: BLOOD RIGHT WRIST  Result Value Ref Range Status   Specimen Description BLOOD RIGHT WRIST  Final   Special Requests   Final    BOTTLES DRAWN AEROBIC AND ANAEROBIC Blood Culture results may not be optimal due to an inadequate volume of blood received in culture bottles   Culture   Final    NO GROWTH 3 DAYS Performed at [facility], [address], [place], [place] [address]    Report Status PENDING  Incomplete  Culture, blood (Routine x 2)     Status: None (Preliminary result)   Collection Time: 07/04/23  1:32 AM   Specimen: BLOOD LEFT WRIST  Result Value Ref Range Status   Specimen Description BLOOD LEFT WRIST  Final   Special Requests   Final    BOTTLES DRAWN AEROBIC AND ANAEROBIC Blood Culture adequate volume   Culture   Final    NO GROWTH 3 DAYS Performed at [facility], [address], [place], [place] [address]    Report Status PENDING  Incomplete  Resp panel by RT-PCR (RSV, Flu A&B, Covid) Anterior Nasal Swab     Status: None   Collection Time: 07/04/23  2:38 AM   Specimen: Anterior Nasal Swab  Result Value Ref Range Status   SARS Coronavirus 2 by RT  PCR NEGATIVE NEGATIVE Final   Influenza A by PCR NEGATIVE NEGATIVE Final   Influenza B by PCR NEGATIVE NEGATIVE Final    Comment: (NOTE) The Xpert Xpress SARS-CoV-2/FLU/RSV plus assay is intended as an aid in the diagnosis  of influenza from Nasopharyngeal swab specimens and should not be used as a sole basis for treatment. Nasal washings and aspirates are unacceptable for Xpert Xpress SARS-CoV-2/FLU/RSV testing.  Fact Sheet for Patients: BloggerCourse.com  Fact Sheet for Healthcare Providers: SeriousBroker.it  This test is not yet approved or cleared by the [place] FDA and has been authorized for detection and/or diagnosis of SARS-CoV-2 by FDA under an Emergency Use Authorization (EUA). This EUA will remain in effect (meaning this test can be used) for the duration of the COVID-19 declaration under Section 564(b)(1) of the Act, 21 U.S.C. section 360bbb-3(b)(1), unless the authorization is terminated or revoked.     Resp Syncytial Virus by PCR NEGATIVE NEGATIVE Final    Comment: (NOTE) Fact Sheet for Patients: BloggerCourse.com  Fact Sheet for Healthcare Providers: SeriousBroker.it  This test is not yet approved or cleared by the [place] FDA and has been authorized for detection and/or diagnosis of SARS-CoV-2 by FDA under an Emergency Use Authorization (EUA). This EUA will remain in effect (meaning this test can be used) for the duration of the COVID-19 declaration under Section 564(b)(1) of the Act, 21 U.S.C. section 360bbb-3(b)(1), unless the authorization is terminated or revoked.  Performed at [facility], [address] [place], [place] [address]   Respiratory (~20 pathogens) panel by PCR     Status: None   Collection Time: 07/04/23  8:58 AM   Specimen: Nasopharyngeal Swab; Respiratory  Result Value Ref Range Status   Adenovirus NOT DETECTED NOT DETECTED Final   Coronavirus 229E NOT DETECTED NOT DETECTED Final    Comment: (NOTE) The Coronavirus on the Respiratory Panel, DOES NOT test for the novel  Coronavirus (2019 nCoV)    Coronavirus HKU1 NOT DETECTED NOT DETECTED  Final   Coronavirus NL63 NOT DETECTED NOT DETECTED Final   Coronavirus OC43 NOT DETECTED NOT DETECTED Final   Metapneumovirus NOT DETECTED NOT DETECTED Final   Rhinovirus / Enterovirus NOT DETECTED NOT DETECTED Final   Influenza A NOT DETECTED NOT DETECTED Final   Influenza B NOT DETECTED NOT DETECTED Final   Parainfluenza Virus 1 NOT DETECTED NOT DETECTED Final   Parainfluenza Virus 2 NOT DETECTED NOT DETECTED Final   Parainfluenza Virus 3 NOT DETECTED NOT DETECTED Final   Parainfluenza Virus 4 NOT DETECTED NOT DETECTED Final   Respiratory Syncytial Virus NOT DETECTED NOT DETECTED Final   Bordetella pertussis NOT DETECTED NOT DETECTED Final   Bordetella Parapertussis NOT DETECTED NOT DETECTED Final   Chlamydophila pneumoniae NOT DETECTED NOT DETECTED Final   Mycoplasma pneumoniae NOT DETECTED NOT DETECTED Final    Comment: Performed at [facility], [address] [place], [place] [address]    Radiology Reports  DG Chest Harold 1 View Result Date: 07/07/2023 CLINICAL DATA:  [id number] with shortness of breath. EXAM: PORTABLE CHEST 1 VIEW COMPARISON:  Portable chest today at 12:54 a.m. FINDINGS: 6:47 a.m. The left lung base was partially excluded from the study. Also, the patient is obliquely rotated to the left further limiting the study. There is again noted left pleural effusion and overlying basilar airspace disease, with increased patchy consolidation now seen in left upper to mid lung region. Right lung remains clear. There is cardiomegaly with continued central vascular prominence.  No overt edema. Stable mediastinum with aortic tortuosity and calcification. No new osseous findings. IMPRESSION: 1. Increased patchy consolidation in the left upper to mid lung region. 2. Persistent left pleural effusion and overlying basilar airspace disease. 3. Cardiomegaly with continued central vascular prominence. No overt edema. 4. Aortic atherosclerosis. Electronically Signed   By: Almira Bar M.D.    On: 07/07/2023 07:23   DG Chest Port 1 View Result Date: 07/07/2023 CLINICAL DATA:  Hypoxia EXAM: PORTABLE CHEST 1 VIEW COMPARISON:  07/04/2023 FINDINGS: Cardiac enlargement. No vascular congestion. Slight interstitial changes in the lung bases could indicate edema or fibrosis. Small left pleural effusion with basilar atelectasis or consolidation. Appearances are similar to prior study. Emphysematous changes suggested in the lungs. No pneumothorax. Mediastinal contours appear intact. Calcification of the aorta. IMPRESSION: Cardiac enlargement. Left pleural effusion with basilar atelectasis. Interstitial fibrosis or edema in the lung bases. Electronically Signed   By: Burman Nieves M.D.   On: 07/07/2023 00:59      Signature  -   Susa Raring M.D on 07/07/2023 at 9:21 AM   -  To page go to [email or web address]

## 2023-07-07 NOTE — Progress Notes (Signed)
 Patient given flutter per MD order.  Patient performed well and with good effort.

## 2023-07-07 NOTE — Progress Notes (Signed)
 TRH night cross cover note:   I was notified by RN that the patient is experiencing some increase in supplemental oxygen requirements.  He is here with acute COPD exacerbation as well as community-acquired pneumonia.  He has been on 4 L nasal cannula throughout the day, but is experienced increase in supplemental oxygen requirements this evening, now with oxygen saturations in the high 80s on 6 L nasal cannula.  This was associated with mild increase in shortness of breath.  Shortness of breath has improved following scheduled dose of Brovana, although oxygen saturations remain unchanged at this time.  Not associated with any chest pain.  His vital signs are otherwise stable, with the patient afebrile, heart rates in the 60s, most recent blood pressure noted to be 138/90.  In the setting of his acute COPD exacerbation as well as community-acquired pneumonia, he is on daily prednisone, scheduled Brovana, prn duo nebulizers, flutter valve, incentive spirometry, in addition to azithromycin and Rocephin.    The patient has subsequently been switched from nasal cannula to nonrebreather, with ensuing improvement in oxygen saturations into the mid 90s.  To further evaluate the above, I have ordered chest x-ray, VBG, BNP, and added phosphorus level to morning labs.     Newton Pigg, DO Hospitalist

## 2023-07-07 NOTE — Progress Notes (Signed)
 RT called by RN due to pt de sating. RT assessed pt, Rt put pt on 3l [place] with 95% saturation. No resp distress noted at this time. Pt is alert and oriented. RN aware. Will continue to monitor.

## 2023-07-07 NOTE — TOC Progression Note (Addendum)
 Transition of Care [facility]) - Progression Note    Patient Details  Name: [name] MRN: [id number] Date of Birth: 11-10-1945  Transition of Care [facility]) CM/SW Contact  Lawerance Sabal, RN Phone Number: 07/07/2023, 9:18 AM  Clinical Narrative:     Spoke w patient, he states that he has a RW at home. Per MD observing for respiratory needs, no DC today.  3/23 08:30  Notified HH agency of DC and verified with patient that he has full set up for home oxygen 3/23 16:00 Talked to daughter in law and discussed HH needs. Added OT and Aid to [facility] order and confirmed [facility] for tomorrow w [facility]. Provided Jennifer's number to Amy w [facility] to schedule time for tomorrow.       Expected Discharge Plan and Services       Living arrangements for the past 2 months: Single Family Home Expected Discharge Date: 07/07/23                                     Social Determinants of Health (SDOH) Interventions SDOH Screenings   Food Insecurity: No Food Insecurity (07/04/2023)  Housing: Low Risk  (07/04/2023)  Transportation Needs: No Transportation Needs (07/04/2023)  Utilities: Not At Risk (07/04/2023)  Depression (PHQ2-9): Low Risk  (09/03/2018)  Financial Resource Strain: Low Risk  (01/03/2023)   Received from [facility]  Social Connections: Moderately Isolated (07/04/2023)  Stress: No Stress Concern Present (08/21/2018)  Tobacco Use: High Risk (07/04/2023)    Readmission Risk Interventions    04/28/2021    9:32 AM  Readmission Risk Prevention Plan  Transportation Screening Complete  PCP or Specialist Appt within 3-5 Days Complete  HRI or Home Care Consult Complete  Social Work Consult for Recovery Care Planning/Counseling Complete  Palliative Care Screening Not Applicable  Medication Review [employer]) Complete

## 2023-07-07 NOTE — Plan of Care (Signed)

## 2023-07-07 NOTE — Discharge Instructions (Addendum)
 Follow with Primary MD Danella Penton, MD in 7 days follow-up with your pulmonologist within a week of discharge.  Get CBC, CMP, phosphorus, magnesium, 2 view Chest X ray -  checked next visit with your primary MD  Activity: As tolerated with Full fall precautions use walker/cane & assistance as needed  Disposition Home   Diet: Heart Healthy  -  Check your Weight same time everyday, if you gain over 2 pounds, or you develop in leg swelling, experience more shortness of breath or chest pain, call your Primary MD immediately. Follow Cardiac Low Salt Diet and 1.5 lit/day fluid restriction.  Special Instructions: If you have smoked or chewed Tobacco  in the last 2 yrs please stop smoking, stop any regular Alcohol  and or any Recreational drug use.  On your next visit with your primary care physician please Get Medicines reviewed and adjusted.  Please request your Prim.MD to go over all Hospital Tests and Procedure/Radiological results at the follow up, please get all Hospital records sent to your Prim MD by signing hospital release before you go home.  If you experience worsening of your admission symptoms, develop shortness of breath, life threatening emergency, suicidal or homicidal thoughts you must seek medical attention immediately by calling 911 or calling your MD immediately  if symptoms less severe.  You Must read complete instructions/literature along with all the possible adverse reactions/side effects for all the Medicines you take and that have been prescribed to you. Take any new Medicines after you have completely understood and accpet all the possible adverse reactions/side effects.   Do not drive when taking Pain medications.  Do not take more than prescribed Pain, Sleep and Anxiety Medications  Wear Seat belts while driving.

## 2023-07-08 DIAGNOSIS — J189 Pneumonia, unspecified organism: Secondary | ICD-10-CM | POA: Diagnosis not present

## 2023-07-08 DIAGNOSIS — A419 Sepsis, unspecified organism: Secondary | ICD-10-CM | POA: Diagnosis not present

## 2023-07-08 LAB — BASIC METABOLIC PANEL
Anion gap: 10 (ref 5–15)
BUN: 24 mg/dL — ABNORMAL HIGH (ref 8–23)
CO2: 25 mmol/L (ref 22–32)
Calcium: 8 mg/dL — ABNORMAL LOW (ref 8.9–10.3)
Chloride: 106 mmol/L (ref 98–111)
Creatinine, Ser: 1.06 mg/dL (ref 0.61–1.24)
GFR, Estimated: >60 mL/min (ref >60)
Glucose, Bld: 82 mg/dL (ref 70–99)
Potassium: 4.2 mmol/L (ref 3.5–5.1)
Sodium: 141 mmol/L (ref 135–145)

## 2023-07-08 LAB — CBC
HCT: 38.1 % — ABNORMAL LOW (ref 39.0–52.0)
Hemoglobin: 12.4 g/dL — ABNORMAL LOW (ref 13.0–17.0)
MCH: 30.7 pg (ref 26.0–34.0)
MCHC: 32.5 g/dL (ref 30.0–36.0)
MCV: 94.3 fL (ref 80.0–100.0)
Platelets: 145 10*3/uL — ABNORMAL LOW (ref 150–400)
RBC: 4.04 MIL/uL — ABNORMAL LOW (ref 4.22–5.81)
RDW: 12.7 % (ref 11.5–15.5)
WBC: 7.2 10*3/uL (ref 4.0–10.5)
nRBC: 0 % (ref 0.0–0.2)

## 2023-07-08 MED ORDER — FUROSEMIDE 10 MG/ML IJ SOLN
INTRAMUSCULAR | Status: AC
  Filled 2023-07-08: qty 4

## 2023-07-08 MED ORDER — FUROSEMIDE 10 MG/ML IJ SOLN: 20.0000 mg | Freq: Once | INTRAMUSCULAR | Status: DC

## 2023-07-08 MED ORDER — FUROSEMIDE 10 MG/ML IJ SOLN
40.0000 mg | Freq: Once | INTRAMUSCULAR | Status: AC
  Administered 2023-07-08: 40 mg via INTRAVENOUS

## 2023-07-08 NOTE — Discharge Summary (Signed)
 [name] MWU:[id number] DOB: 05/12/1945 DOA: 07/04/2023  PCP: Danella Penton, MD  Admit date: 07/04/2023  Discharge date: 07/11/2023  Admitted From: Home   Disposition:  SNF   Recommendations for Outpatient Follow-up:   Follow up with PCP in 1-2 weeks  PCP Please obtain BMP/CBC, 2 view CXR in 1week,  (see Discharge instructions)   PCP Please follow up on the following pending results:     Home Health: PT, RN   Equipment/Devices: as below  Consultations: None  Discharge Condition: Stable    CODE STATUS: Full    Diet Recommendation: Heart Healthy     Chief Complaint  Patient presents with   Respiratory Distress     Brief history of present illness from the day of admission and additional interim summary    78 year old with history of HTN, HLD, COPD, chronic hypoxia, AAA status post endovascular repair, CAD, chronic CHF with reduced EF 45% admitted for cough, shortness of breath. Upon evaluation concerns of community-acquired pneumonia and COPD exacerbation. Slowly improving on bronchodilators, steroids, Rocephin/azithromycin.                                                                  Hospital Course   Severe sepsis and acute on chronic hypoxic respiratory failure secondary to community-acquired pneumonia and acute COPD exacerbation, POA:   He appears to have left-sided community-acquired pneumonia, seen and cleared by speech, COVID/flu/RSV, RVP-negativ, Continue bronchodilators, I-S/flutter valve, counseled to quit smoking, has finished antibiotic treatment, currently on Medrol Dosepak, he is symptom-free this morning, he is on 4 to 6 L of oxygen at home at baseline and currently on 4 L.  He was encouraged to sit in chair use I-S and flutter valve for pulmonary toiletry at home, sepsis  pathophysiology has completely resolved he feels at his baseline eager to go home, strictly counseled to stop smoking, will be given a Medrol Dosepak, lung exam stable request PCP to monitor closely.  He now agreeable to SNF placement where he will be discharged today.  Please encourage the patient to sit in chair use I-S and flutter valve every 30 minutes while awake at SNF.    Chronic HFmrEF EF was 40-45% on echo from August 2024 .  After sepsis resolved he was placed back on his home diuretic regimen along with Farxiga, blood pressure was too low and required midodrine to augment it, Entresto had to be discontinued.  Kindly have patient follow-up with PCP and his cardiologist within a week of discharge, monitor CHF medications and blood pressure closely.   Hypokalemia, hypophosphatemia   - Replaced, PCP to recheck in 7 to 10 days at [facility].   Essential hypertension: Low normal on home diuretics, Entresto discontinued, placed on low-dose midodrine.   CAD/hyperlipidemia   Denies chest pain.  On aspirin and statin   Hx of CVA   ASA, Lipitor     Ongoing smoking.  Counseled to quit.     Acute thrombocytopenia: likely in the setting of sepsis trend stable PCP to monitor.   GERD: PPI.    Discharge diagnosis     Principal Problem:   Sepsis due to pneumonia [facility]) Active Problems:   Essential hypertension   Coronary artery disease of native artery of native heart with stable angina pectoris (HCC)   COPD exacerbation (HCC)   History of stroke   Chronic combined systolic and diastolic congestive heart failure (HCC)   Acute on chronic respiratory failure with hypoxia [facility])    Discharge instructions    Discharge Instructions     Discharge instructions   Complete by: As directed    Follow with Primary MD Danella Penton, MD in 7 days follow-up with your pulmonologist within a week of discharge.  Get CBC, CMP, phosphorus, magnesium, 2 view Chest X ray -  checked next visit with your primary  MD    Activity: As tolerated with Full fall precautions use walker/cane & assistance as needed  Disposition Home   Diet: Heart Healthy  -  Check your Weight same time everyday, if you gain over 2 pounds, or you develop in leg swelling, experience more shortness of breath or chest pain, call your Primary MD immediately. Follow Cardiac Low Salt Diet and 1.5 lit/day fluid restriction.  Special Instructions: If you have smoked or chewed Tobacco  in the last 2 yrs please stop smoking, stop any regular Alcohol  and or any Recreational drug use.  On your next visit with your primary care physician please Get Medicines reviewed and adjusted.  Please request your Prim.MD to go over all Hospital Tests and Procedure/Radiological results at the follow up, please get all Hospital records sent to your Prim MD by signing hospital release before you go home.  If you experience worsening of your admission symptoms, develop shortness of breath, life threatening emergency, suicidal or homicidal thoughts you must seek medical attention immediately by calling 911 or calling your MD immediately  if symptoms less severe.  You Must read complete instructions/literature along with all the possible adverse reactions/side effects for all the Medicines you take and that have been prescribed to you. Take any new Medicines after you have completely understood and accpet all the possible adverse reactions/side effects.   Do not drive when taking Pain medications.  Do not take more than prescribed Pain, Sleep and Anxiety Medications  Wear Seat belts while driving.   For home use only DME Nebulizer/meds   Complete by: As directed    Patient needs a nebulizer to treat with the following condition: COPD (chronic obstructive pulmonary disease) (HCC)   Length of Need: Lifetime   Increase activity slowly   Complete by: As directed        Discharge Medications   Allergies as of 07/11/2023       Reactions   Lyrica  [pregabalin] Swelling           Medication List     STOP taking these medications    Entresto 24-26 MG Generic drug: sacubitril-valsartan       TAKE these medications    acetaminophen 325 MG tablet Commonly known as: TYLENOL Take 2 tablets (650 mg total) by mouth every 6 (six) hours as needed for mild pain (or Fever >/= 101).   albuterol 108 (90 Base) MCG/ACT inhaler Commonly known as:  VENTOLIN HFA Inhale 2 puffs into the lungs every 6 (six) hours as needed for wheezing.   albuterol (2.5 MG/3ML) 0.083% nebulizer solution Commonly known as: PROVENTIL Take 2.5 mg by nebulization every 6 (six) hours as needed.   aspirin EC 81 MG tablet Take 81 mg by mouth daily.   atorvastatin 80 MG tablet Commonly known as: LIPITOR Take 1 tablet (80 mg total) by mouth daily.   dapagliflozin propanediol 10 MG Tabs tablet Commonly known as: FARXIGA Take 1 tablet (10 mg total) by mouth daily.   furosemide 20 MG tablet Commonly known as: LASIX Take 1 tablet (20 mg total) by mouth daily. Take 40 mg (2 tablets) on Monday and Friday only. Take Lasix 20 mg (1 tablet) on the other days.   ipratropium-albuterol 0.5-2.5 (3) MG/3ML Soln Commonly known as: DUONEB Use 1 vial twice a day (scheduled) and every 4 hours as needed for shortness of breath and wheezing as directed (Use twice a day scheduled and every 4 hours as needed for shortness of breath and wheezing)   methylPREDNISolone 4 MG Tbpk tablet Commonly known as: MEDROL DOSEPAK follow package directions   midodrine 5 MG tablet Commonly known as: PROAMATINE Take 1 tablet (5 mg total) by mouth 2 (two) times daily with a meal.   multivitamin with minerals tablet Take 1 tablet by mouth daily.   omeprazole 40 MG capsule Commonly known as: PRILOSEC TAKE 1 CAPSULE BY MOUTH DAILY USUALLY BEFORE BREAKFAST   spironolactone 25 MG tablet Commonly known as: ALDACTONE Take 0.5 tablets (12.5 mg total) by mouth daily. FOR CHRONIC  COMBINED CHF   tiotropium 18 MCG inhalation capsule Commonly known as: Spiriva HandiHaler Place 1 capsule (18 mcg total) into inhaler and inhale daily.               Durable Medical Equipment  (From admission, onward)           Start     Ordered   07/07/23 0759  For home use only DME Walker rolling  Once       Comments: 5 wheel  Question Answer Comment  Walker: With 5 Inch Wheels   Patient needs a walker to treat with the following condition Weakness      07/07/23 0758   07/07/23 0000  For home use only DME Nebulizer/meds       Question Answer Comment  Patient needs a nebulizer to treat with the following condition COPD (chronic obstructive pulmonary disease) (HCC)   Length of Need Lifetime      07/07/23 0912             Contact information for follow-up providers     [facility] Follow up.   Why: Iantha Fallen will contact you within 48 hours of DC to arrange a home health visit Contact information: [address] [place] [address] [phone]         Danella Penton, MD. Schedule an appointment as soon as possible for a visit in 1 week(s).   Specialty: Internal Medicine Why: Also follow-up with your pulmonologist within a week of discharge Contact information: [address] [facility] [place] [address] [phone]              Contact information for after-discharge care     Destination     [facility] [place] .   Service: Skilled Paramedic information: [address] [place] [address] [phone]  Major procedures and Radiology Reports - PLEASE review detailed and final reports thoroughly  -       DG Chest Port 1 View Result Date: 07/07/2023 CLINICAL DATA:  [id number] with shortness of breath. EXAM: PORTABLE CHEST 1 VIEW COMPARISON:  Portable chest today at 12:54 a.m. FINDINGS: 6:47 a.m. The  left lung base was partially excluded from the study. Also, the patient is obliquely rotated to the left further limiting the study. There is again noted left pleural effusion and overlying basilar airspace disease, with increased patchy consolidation now seen in left upper to mid lung region. Right lung remains clear. There is cardiomegaly with continued central vascular prominence. No overt edema. Stable mediastinum with aortic tortuosity and calcification. No new osseous findings. IMPRESSION: 1. Increased patchy consolidation in the left upper to mid lung region. 2. Persistent left pleural effusion and overlying basilar airspace disease. 3. Cardiomegaly with continued central vascular prominence. No overt edema. 4. Aortic atherosclerosis. Electronically Signed   By: Almira Bar M.D.   On: 07/07/2023 07:23   DG Chest Port 1 View Result Date: 07/07/2023 CLINICAL DATA:  Hypoxia EXAM: PORTABLE CHEST 1 VIEW COMPARISON:  07/04/2023 FINDINGS: Cardiac enlargement. No vascular congestion. Slight interstitial changes in the lung bases could indicate edema or fibrosis. Small left pleural effusion with basilar atelectasis or consolidation. Appearances are similar to prior study. Emphysematous changes suggested in the lungs. No pneumothorax. Mediastinal contours appear intact. Calcification of the aorta. IMPRESSION: Cardiac enlargement. Left pleural effusion with basilar atelectasis. Interstitial fibrosis or edema in the lung bases. Electronically Signed   By: Burman Nieves M.D.   On: 07/07/2023 00:59   DG Chest Port 1 View Result Date: 07/04/2023 CLINICAL DATA:  Questionable sepsis-evaluate for abnormality EXAM: PORTABLE CHEST 1 VIEW COMPARISON:  01/16/2023 FINDINGS: Stable cardiomediastinal silhouette. Aortic atherosclerotic calcification. Retrocardiac atelectasis or pneumonia. Small left pleural effusion. The right lung is clear. No pneumothorax. No displaced rib fracture. IMPRESSION: Retrocardiac atelectasis  or pneumonia and small left pleural effusion. Electronically Signed   By: Minerva Fester M.D.   On: 07/04/2023 02:28    Micro Results     Recent Results (from the past 240 hours)  Culture, blood (Routine x 2)     Status: None   Collection Time: 07/04/23  1:26 AM   Specimen: BLOOD RIGHT WRIST  Result Value Ref Range Status   Specimen Description BLOOD RIGHT WRIST  Final   Special Requests   Final    BOTTLES DRAWN AEROBIC AND ANAEROBIC Blood Culture results may not be optimal due to an inadequate volume of blood received in culture bottles   Culture   Final    NO GROWTH 5 DAYS Performed at [facility], [address], [place], [place] [address]    Report Status 07/09/2023 FINAL  Final  Culture, blood (Routine x 2)     Status: None   Collection Time: 07/04/23  1:32 AM   Specimen: BLOOD LEFT WRIST  Result Value Ref Range Status   Specimen Description BLOOD LEFT WRIST  Final   Special Requests   Final    BOTTLES DRAWN AEROBIC AND ANAEROBIC Blood Culture adequate volume   Culture   Final    NO GROWTH 5 DAYS Performed at [facility], [address], [place], [place] [address]    Report Status 07/09/2023 FINAL  Final  Resp panel by RT-PCR (RSV, Flu A&B, Covid) Anterior Nasal Swab     Status: None   Collection Time: 07/04/23  2:38 AM   Specimen: Anterior Nasal Swab  Result Value Ref Range Status   SARS Coronavirus 2 by RT PCR NEGATIVE NEGATIVE Final   Influenza A by PCR NEGATIVE NEGATIVE Final   Influenza B by PCR NEGATIVE NEGATIVE Final    Comment: (NOTE) The Xpert Xpress SARS-CoV-2/FLU/RSV plus assay is intended as an aid in the diagnosis of influenza from Nasopharyngeal swab specimens and should not be used as a sole basis for treatment. Nasal washings and aspirates are unacceptable for Xpert Xpress SARS-CoV-2/FLU/RSV testing.  Fact Sheet for Patients: BloggerCourse.com  Fact Sheet for Healthcare  Providers: SeriousBroker.it  This test is not yet approved or cleared by the [place] FDA and has been authorized for detection and/or diagnosis of SARS-CoV-2 by FDA under an Emergency Use Authorization (EUA). This EUA will remain in effect (meaning this test can be used) for the duration of the COVID-19 declaration under Section 564(b)(1) of the Act, 21 U.S.C. section 360bbb-3(b)(1), unless the authorization is terminated or revoked.     Resp Syncytial Virus by PCR NEGATIVE NEGATIVE Final    Comment: (NOTE) Fact Sheet for Patients: BloggerCourse.com  Fact Sheet for Healthcare Providers: SeriousBroker.it  This test is not yet approved or cleared by the [place] FDA and has been authorized for detection and/or diagnosis of SARS-CoV-2 by FDA under an Emergency Use Authorization (EUA). This EUA will remain in effect (meaning this test can be used) for the duration of the COVID-19 declaration under Section 564(b)(1) of the Act, 21 U.S.C. section 360bbb-3(b)(1), unless the authorization is terminated or revoked.  Performed at [facility], [address] [place], [place] [address]   Respiratory (~20 pathogens) panel by PCR     Status: None   Collection Time: 07/04/23  8:58 AM   Specimen: Nasopharyngeal Swab; Respiratory  Result Value Ref Range Status   Adenovirus NOT DETECTED NOT DETECTED Final   Coronavirus 229E NOT DETECTED NOT DETECTED Final    Comment: (NOTE) The Coronavirus on the Respiratory Panel, DOES NOT test for the novel  Coronavirus (2019 nCoV)    Coronavirus HKU1 NOT DETECTED NOT DETECTED Final   Coronavirus NL63 NOT DETECTED NOT DETECTED Final   Coronavirus OC43 NOT DETECTED NOT DETECTED Final   Metapneumovirus NOT DETECTED NOT DETECTED Final   Rhinovirus / Enterovirus NOT DETECTED NOT DETECTED Final   Influenza A NOT DETECTED NOT DETECTED Final   Influenza B NOT DETECTED  NOT DETECTED Final   Parainfluenza Virus 1 NOT DETECTED NOT DETECTED Final   Parainfluenza Virus 2 NOT DETECTED NOT DETECTED Final   Parainfluenza Virus 3 NOT DETECTED NOT DETECTED Final   Parainfluenza Virus 4 NOT DETECTED NOT DETECTED Final   Respiratory Syncytial Virus NOT DETECTED NOT DETECTED Final   Bordetella pertussis NOT DETECTED NOT DETECTED Final   Bordetella Parapertussis NOT DETECTED NOT DETECTED Final   Chlamydophila pneumoniae NOT DETECTED NOT DETECTED Final   Mycoplasma pneumoniae NOT DETECTED NOT DETECTED Final    Comment: Performed at [facility], [address], [place], [place] [address]    Today   Subjective    [name] today has no headache,no chest abdominal pain,no new weakness tingling or numbness, feels much better wants to go home today.     Objective   Blood pressure (!) 96/47, pulse 62, temperature 97.9 F (36.6 C), temperature source Oral, resp. rate 17, height 6' (1.829 m), weight 76.5 kg, SpO2 92%.   Intake/Output Summary (Last 24 hours) at 07/11/2023 1020 Last data  filed at 07/11/2023 0915 Gross per 24 hour  Intake 200 ml  Output 300 ml  Net -100 ml    Exam  Awake Alert, No new F.N deficits,    .AT,PERRAL Supple Neck,   Symmetrical Chest wall movement, Good air movement bilaterally, no crackles or wheezes today RRR,No Gallops,   +ve B.Sounds, Abd Soft, Non tender,  No Cyanosis, Clubbing or edema    Data Review   Recent Labs  Lab 07/05/23 0501 07/06/23 0400 07/07/23 0538 07/08/23 0820 07/10/23 0459  WBC 18.2* 14.4* 9.6 7.2 8.8  HGB 11.7* 11.6* 11.3* 12.4* 12.8*  HCT 37.3* 35.6* 35.0* 38.1* 39.4  PLT 125* 145* 137* 145* 139*  MCV 96.9 96.5 97.0 94.3 95.2  MCH 30.4 31.4 31.3 30.7 30.9  MCHC 31.4 32.6 32.3 32.5 32.5  RDW 13.0 13.0 12.8 12.7 12.7  LYMPHSABS  --   --  0.8  --  1.6  MONOABS  --   --  0.4  --  0.6  EOSABS  --   --  0.0  --  0.3  BASOSABS  --   --  0.0  --  0.0    Recent Labs  Lab 07/04/23 1155  07/04/23 1800 07/05/23 0501 07/05/23 0501 07/06/23 0400 07/07/23 0538 07/08/23 0541 07/10/23 0459 07/11/23 0506  NA  --   --  139  --  141 140 141 143  --   K  --   --  3.6  --  3.4* 3.8 4.2 3.6  --   CL  --   --  105  --  104 105 106 103  --   CO2  --   --  29  --  29 29 25  34*  --   ANIONGAP  --   --  5  --  8 6 10 6   --   GLUCOSE  --   --  134*  --  105* 93 82 88  --   BUN  --   --  21  --  24* 24* 24* 22  --   CREATININE  --   --  1.17   < > 1.10 1.10 1.06 1.16 1.09  LATICACIDVEN 2.2* 1.1  --   --   --   --   --   --   --   BNP  --   --   --   --   --  1,463.7*  --   --   --   MG  --   --  1.7  --  1.9 1.9  --  1.7  --   PHOS  --   --   --   --   --  2.4*  --   --   --   CALCIUM  --   --  8.2*  --  8.3* 8.3* 8.0* 8.0*  --    < > = values in this interval not displayed.    Total Time in preparing paper work, data evaluation and todays exam - 35 minutes  Signature  -    Susa Raring M.D on 07/11/2023 at 10:20 AM   -  To page go to [email or web address]

## 2023-07-08 NOTE — Plan of Care (Signed)

## 2023-07-08 NOTE — Plan of Care (Signed)

## 2023-07-09 DIAGNOSIS — A419 Sepsis, unspecified organism: Secondary | ICD-10-CM | POA: Diagnosis not present

## 2023-07-09 DIAGNOSIS — J189 Pneumonia, unspecified organism: Secondary | ICD-10-CM | POA: Diagnosis not present

## 2023-07-09 LAB — CULTURE, BLOOD (ROUTINE X 2)
Culture: NO GROWTH
Culture: NO GROWTH
Special Requests: ADEQUATE

## 2023-07-09 MED ORDER — FUROSEMIDE 40 MG PO TABS
40.0000 mg | ORAL_TABLET | Freq: Every day | ORAL | Status: DC
  Administered 2023-07-09 – 2023-07-11 (×3): 40 mg via ORAL
  Filled 2023-07-09 (×3): qty 1

## 2023-07-09 NOTE — Progress Notes (Signed)
 PROGRESS NOTE    [name]  WUJ:[id number] DOB: 1945-11-03 DOA: 07/04/2023 PCP: Danella Penton, MD    Brief Narrative:   78 year old with history of HTN, HLD, COPD, chronic hypoxia, AAA status post endovascular repair, CAD, chronic CHF with reduced EF 45% admitted for cough, shortness of breath.  Upon evaluation concerns of community-acquired pneumonia and COPD exacerbation.  Slowly improving on bronchodilators, steroids, Rocephin/azithromycin.  Assessment & Plan:   Severe sepsis and acute on chronic hypoxic respiratory failure secondary to community-acquired pneumonia and acute COPD exacerbation, POA: He appears to have left-sided community-acquired pneumonia, seen and cleared by speech, COVID/flu/RSV, RVP-negativ, Continue bronchodilators, I-S/flutter valve, counseled to quit smoking, finished empiric antibiotic treatment with Rocephin and azithromycin, cultures thus far negative continue to follow.  He is on 4 to 6 L of oxygen at home at baseline, encouraged him to sit in chair use I-S and flutter valve for pulmonary toiletry, advance activity and titrate down oxygen.  Sepsis pathophysiology has resolved.  SpO2: 98 % O2 Flow Rate (L/min): 5 L/min   Chronic HFmrEF  EF was 40-45% on echo from August 2024 .  Overall compensated in nature.  Currently home medications are on hold until sepsis physiology stabilizes.  For now on heart healthy diet will fluid restriction, dose of IV Lasix on 07/07/2023.  Cautiously resume home CHF medications.  Hypokalemia, hypophosphatemia - As needed repletion   Essential hypertension:  Improved Home medications will be resumed.  Monitor diuretic dose closely.   CAD/hyperlipidemia Denies chest pain.  On aspirin and statin   Hx of CVA  ASA, Lipitor     Ongoing smoking.  Counseled to quit.    Weakness and deconditioning.  PT OT, was discharged with home PT on 07/08/2023, however family not comfortable taking him home, will request TOC to look at  other options as well, PT to reevaluate again on 07/09/2023.    Acute thrombocytopenia:  likely in the setting of severe sepsis.  No bleeding.  Monitor closely.   GERD: PPI.  PT/OT-pending  DVT prophylaxis: Lovenox    Code Status: Full Code Family Communication:   Status is: Inpatient    Subjective:  Patient in bed, appears comfortable, denies any headache, no fever, no chest pain or pressure, no shortness of breath , no abdominal pain. No new focal weakness.   Vitals:   07/09/23 0400 07/09/23 0426 07/09/23 0854 07/09/23 0858  BP: (!) 142/80  129/64   Pulse: (!) 56  70   Temp: 98.3 F (36.8 C)     Resp: 19  (!) 23   Height:      Weight:  79.9 kg    SpO2: 94%  95% 98%  TempSrc: Oral     BMI (Calculated):  23.88      Examination:  Awake Alert, No new F.N deficits, Normal affect [place].AT,PERRAL Supple Neck, No JVD,   Symmetrical Chest wall movement, moderate air movement bilaterally with coarse bilateral breath sounds RRR,No Gallops, Rubs or new Murmurs,  +ve B.Sounds, Abd Soft, No tenderness,   No Cyanosis, Clubbing or edema     Diet Orders (From admission, onward)     Start     Ordered   07/04/23 0536  Diet Heart Room service appropriate? Yes; Fluid consistency: Thin  Diet effective now       Question Answer Comment  Room service appropriate? Yes   Fluid consistency: Thin      07/04/23 0536  Data Review:   Inpatient Medications  Scheduled Meds:  arformoterol  15 mcg Nebulization BID   aspirin EC  81 mg Oral Daily   atorvastatin  80 mg Oral Daily   enoxaparin (LOVENOX) injection  40 mg Subcutaneous Q24H   pantoprazole  40 mg Oral Daily   revefenacin  175 mcg Nebulization Daily   sacubitril-valsartan  1 tablet Oral BID   sodium chloride flush  3 mL Intravenous Q12H   spironolactone  12.5 mg Oral Daily   Continuous Infusions:   PRN Meds:.acetaminophen **OR** acetaminophen, guaiFENesin, hydrALAZINE, ipratropium-albuterol, metoprolol  tartrate, ondansetron **OR** ondansetron (ZOFRAN) IV, oxyCODONE, senna-docusate, traZODone  DVT Prophylaxis  enoxaparin (LOVENOX) injection 40 mg Start: 07/04/23 1000  Recent Labs  Lab 07/04/23 0126 07/04/23 0140 07/05/23 0501 07/06/23 0400 07/07/23 0538 07/08/23 0820  WBC 18.9*  --  18.2* 14.4* 9.6 7.2  HGB 14.1 15.0 11.7* 11.6* 11.3* 12.4*  HCT 44.6 44.0 37.3* 35.6* 35.0* 38.1*  PLT 144*  --  125* 145* 137* 145*  MCV 99.6  --  96.9 96.5 97.0 94.3  MCH 31.5  --  30.4 31.4 31.3 30.7  MCHC 31.6  --  31.4 32.6 32.3 32.5  RDW 12.8  --  13.0 13.0 12.8 12.7  LYMPHSABS 0.4*  --   --   --  0.8  --   MONOABS 0.8  --   --   --  0.4  --   EOSABS 0.0  --   --   --  0.0  --   BASOSABS 0.0  --   --   --  0.0  --     Recent Labs  Lab 07/04/23 0126 07/04/23 0134 07/04/23 0140 07/04/23 0254 07/04/23 0350 07/04/23 1155 07/04/23 1800 07/05/23 0501 07/06/23 0400 07/07/23 0538 07/08/23 0541  NA 140  --  141  --   --   --   --  139 141 140 141  K 3.5  --  3.7  --   --   --   --  3.6 3.4* 3.8 4.2  CL 103  --   --   --   --   --   --  105 104 105 106  CO2 26  --   --   --   --   --   --  29 29 29 25   ANIONGAP 11  --   --   --   --   --   --  5 8 6 10   GLUCOSE 155*  --   --   --   --   --   --  134* 105* 93 82  BUN 16  --   --   --   --   --   --  21 24* 24* 24*  CREATININE 1.38*  --   --   --   --   --   --  1.17 1.10 1.10 1.06  AST 19  --   --   --   --   --   --   --   --   --   --   ALT 12  --   --   --   --   --   --   --   --   --   --   ALKPHOS 64  --   --   --   --   --   --   --   --   --   --  BILITOT 1.5*  --   --   --   --   --   --   --   --   --   --   ALBUMIN 3.2*  --   --   --   --   --   --   --   --   --   --   LATICACIDVEN  --  2.8*  --   --  2.4* 2.2* 1.1  --   --   --   --   INR 1.1  --   --   --   --   --   --   --   --   --   --   BNP  --   --   --  384.6*  --   --   --   --   --  1,463.7*  --   MG  --   --   --   --   --   --   --  1.7 1.9 1.9  --   PHOS  --    --   --   --   --   --   --   --   --  2.4*  --   CALCIUM 8.3*  --   --   --   --   --   --  8.2* 8.3* 8.3* 8.0*      Recent Labs  Lab 07/04/23 0126 07/04/23 0134 07/04/23 0254 07/04/23 0350 07/04/23 1155 07/04/23 1800 07/05/23 0501 07/06/23 0400 07/07/23 0538 07/08/23 0541  LATICACIDVEN  --  2.8*  --  2.4* 2.2* 1.1  --   --   --   --   INR 1.1  --   --   --   --   --   --   --   --   --   BNP  --   --  384.6*  --   --   --   --   --  1,463.7*  --   MG  --   --   --   --   --   --  1.7 1.9 1.9  --   CALCIUM 8.3*  --   --   --   --   --  8.2* 8.3* 8.3* 8.0*     Micro Results Recent Results (from the past 240 hours)  Culture, blood (Routine x 2)     Status: None   Collection Time: 07/04/23  1:26 AM   Specimen: BLOOD RIGHT WRIST  Result Value Ref Range Status   Specimen Description BLOOD RIGHT WRIST  Final   Special Requests   Final    BOTTLES DRAWN AEROBIC AND ANAEROBIC Blood Culture results may not be optimal due to an inadequate volume of blood received in culture bottles   Culture   Final    NO GROWTH 5 DAYS Performed at [facility], [address] [place], [place] [address]    Report Status 07/09/2023 FINAL  Final  Culture, blood (Routine x 2)     Status: None   Collection Time: 07/04/23  1:32 AM   Specimen: BLOOD LEFT WRIST  Result Value Ref Range Status   Specimen Description BLOOD LEFT WRIST  Final   Special Requests   Final    BOTTLES DRAWN AEROBIC AND ANAEROBIC Blood Culture adequate volume   Culture   Final    NO GROWTH 5 DAYS Performed at  [facility], [address] [place], [place], [place] [address]    Report Status 07/09/2023 FINAL  Final  Resp panel by RT-PCR (RSV, Flu A&B, Covid) Anterior Nasal Swab     Status: None   Collection Time: 07/04/23  2:38 AM   Specimen: Anterior Nasal Swab  Result Value Ref Range Status   SARS Coronavirus 2 by RT PCR NEGATIVE NEGATIVE Final   Influenza A by PCR NEGATIVE NEGATIVE Final   Influenza B by PCR  NEGATIVE NEGATIVE Final    Comment: (NOTE) The Xpert Xpress SARS-CoV-2/FLU/RSV plus assay is intended as an aid in the diagnosis of influenza from Nasopharyngeal swab specimens and should not be used as a sole basis for treatment. Nasal washings and aspirates are unacceptable for Xpert Xpress SARS-CoV-2/FLU/RSV testing.  Fact Sheet for Patients: BloggerCourse.com  Fact Sheet for Healthcare Providers: SeriousBroker.it  This test is not yet approved or cleared by the [place] FDA and has been authorized for detection and/or diagnosis of SARS-CoV-2 by FDA under an Emergency Use Authorization (EUA). This EUA will remain in effect (meaning this test can be used) for the duration of the COVID-19 declaration under Section 564(b)(1) of the Act, 21 U.S.C. section 360bbb-3(b)(1), unless the authorization is terminated or revoked.     Resp Syncytial Virus by PCR NEGATIVE NEGATIVE Final    Comment: (NOTE) Fact Sheet for Patients: BloggerCourse.com  Fact Sheet for Healthcare Providers: SeriousBroker.it  This test is not yet approved or cleared by the [place] FDA and has been authorized for detection and/or diagnosis of SARS-CoV-2 by FDA under an Emergency Use Authorization (EUA). This EUA will remain in effect (meaning this test can be used) for the duration of the COVID-19 declaration under Section 564(b)(1) of the Act, 21 U.S.C. section 360bbb-3(b)(1), unless the authorization is terminated or revoked.  Performed at [facility], [address], [place], [place] [address]   Respiratory (~20 pathogens) panel by PCR     Status: None   Collection Time: 07/04/23  8:58 AM   Specimen: Nasopharyngeal Swab; Respiratory  Result Value Ref Range Status   Adenovirus NOT DETECTED NOT DETECTED Final   Coronavirus 229E NOT DETECTED NOT DETECTED Final    Comment: (NOTE) The  Coronavirus on the Respiratory Panel, DOES NOT test for the novel  Coronavirus (2019 nCoV)    Coronavirus HKU1 NOT DETECTED NOT DETECTED Final   Coronavirus NL63 NOT DETECTED NOT DETECTED Final   Coronavirus OC43 NOT DETECTED NOT DETECTED Final   Metapneumovirus NOT DETECTED NOT DETECTED Final   Rhinovirus / Enterovirus NOT DETECTED NOT DETECTED Final   Influenza A NOT DETECTED NOT DETECTED Final   Influenza B NOT DETECTED NOT DETECTED Final   Parainfluenza Virus 1 NOT DETECTED NOT DETECTED Final   Parainfluenza Virus 2 NOT DETECTED NOT DETECTED Final   Parainfluenza Virus 3 NOT DETECTED NOT DETECTED Final   Parainfluenza Virus 4 NOT DETECTED NOT DETECTED Final   Respiratory Syncytial Virus NOT DETECTED NOT DETECTED Final   Bordetella pertussis NOT DETECTED NOT DETECTED Final   Bordetella Parapertussis NOT DETECTED NOT DETECTED Final   Chlamydophila pneumoniae NOT DETECTED NOT DETECTED Final   Mycoplasma pneumoniae NOT DETECTED NOT DETECTED Final    Comment: Performed at [facility], [address], [place], [place] [address]    Radiology Reports  No results found.     Signature  -   Susa Raring M.D on 07/09/2023 at 9:59 AM   -  To page go  to [email or web address]

## 2023-07-09 NOTE — Progress Notes (Signed)
 Physical Therapy Treatment Patient Details Name: [name] MRN: [id number] DOB: 1945-10-04 Today's Date: 07/09/2023   History of Present Illness Pt is a 78 yo male that presents to [facility] on 07/04/23 for cough and SOB, work up for sepsis secondary to pneumonia. PMH of COPD stage II, anxiety, stroke, neuromuscular disorder,depression, HTN, prostate cancer s/p seed implantation, HLD, lumbar disc disease, CAD, AAA , intraparenchymal hemorrhage in 02/2021, , nephrolithiasis, GERD, tobacco use and chronic ischemic heart failure.    PT Comments  Requires CGA for transfers from bed, supervision with bed mobility and gait. Slow and easily fatigued. SpO2 90-91% on 6L supplemental O2. RW required for support at all times. States he does not have family that can assist/supervise at d/c adequately. Recs updated. Patient will benefit from continued inpatient follow up therapy, <3 hours/day. Would ideally reach mod I level since he will be living alone when he returns home. Patient will continue to benefit from skilled physical therapy services to further improve independence with functional mobility.     If plan is discharge home, recommend the following: A little help with walking and/or transfers;A little help with bathing/dressing/bathroom;Assistance with cooking/housework;Assist for transportation;Help with stairs or ramp for entrance   Can travel by private vehicle     Yes  Equipment Recommendations  None recommended by PT    Recommendations for Other Services       Precautions / Restrictions Precautions Precautions: Fall Recall of Precautions/Restrictions: Intact Restrictions Weight Bearing Restrictions Per Provider Order: No     Mobility  Bed Mobility Overal bed mobility: Needs Assistance Bed Mobility: Supine to Sit     Supine to sit: Supervision     General bed mobility comments: Supervision for safety, slow and effortful but no assist needed.    Transfers Overall transfer  level: Needs assistance Equipment used: Rolling walker (2 wheels) Transfers: Sit to/from Stand Sit to Stand: Contact guard assist           General transfer comment: CGA to rise from bed, supervision from [facility]. Cues for technique, effortful and slow to rise onto feet. RW for stability.    Ambulation/Gait Ambulation/Gait assistance: Supervision Gait Distance (Feet): 150 Feet Assistive device: Rolling walker (2 wheels) Gait Pattern/deviations: Step-through pattern, Decreased stride length, Trunk flexed, Narrow base of support, Knees buckling Gait velocity: Decreased Gait velocity interpretation: <1.31 ft/sec, indicative of household ambulator   General Gait Details: Supervision for safety, very slow, one minor episode of Rt knee buckle but able to self correct. Required 2 standing rest breaks to complete distance today. Spo2 90-91% on 6L supplemental O2, with minimal dyspnea noted. Cues for energy conservation, safety awareness.   Stairs             Wheelchair Mobility     Tilt Bed    Modified Rankin (Stroke Patients Only)       Balance Overall balance assessment: Needs assistance Sitting-balance support: No upper extremity supported, Feet supported Sitting balance-Leahy Scale: Good     Standing balance support: Bilateral upper extremity supported, During functional activity Standing balance-Leahy Scale: Poor Standing balance comment: Dependent on RW for support.                            Communication Communication Communication: No apparent difficulties  Cognition Arousal: Alert Behavior During Therapy: WFL for tasks assessed/performed   PT - Cognitive impairments: No apparent impairments  Following commands: Intact      Cueing Cueing Techniques: Verbal cues  Exercises      General Comments General comments (skin integrity, edema, etc.): SpO2 min 90s on 5L at rest, required 6L while ambulating to maintain  90% or greater.      Pertinent Vitals/Pain Pain Assessment Pain Assessment: No/denies pain    Home Living                          Prior Function            PT Goals (current goals can now be found in the care plan section) Acute Rehab PT Goals Patient Stated Goal: SNF until stronger. PT Goal Formulation: With patient Time For Goal Achievement: 07/13/23 Potential to Achieve Goals: Good Progress towards PT goals: Progressing toward goals    Frequency    Min 2X/week      PT Plan      Co-evaluation              AM-PAC PT "6 Clicks" Mobility   Outcome Measure  Help needed turning from your back to your side while in a flat bed without using bedrails?: None Help needed moving from lying on your back to sitting on the side of a flat bed without using bedrails?: A Little Help needed moving to and from a bed to a chair (including a wheelchair)?: A Little Help needed standing up from a chair using your arms (e.g., wheelchair or bedside chair)?: A Little Help needed to walk in hospital room?: A Little Help needed climbing 3-5 steps with a railing? : A Lot 6 Click Score: 18    End of Session Equipment Utilized During Treatment: Gait belt Activity Tolerance: Patient tolerated treatment well Patient left: in chair;with call bell/phone within reach;with chair alarm set   PT Visit Diagnosis: Unsteadiness on feet (R26.81);Difficulty in walking, not elsewhere classified (R26.2)     Time: 1308-6578 PT Time Calculation (min) (ACUTE ONLY): 30 min  Charges:    $Gait Training: 8-22 mins $Therapeutic Activity: 8-22 mins PT General Charges $$ ACUTE PT VISIT: 1 Visit                     Kathlyn Sacramento, PT, DPT [facility]  Rehabilitation Services Physical Therapist Office: [phone] Website: [email or web address]    Berton Mount 07/09/2023, 12:24 PM

## 2023-07-09 NOTE — TOC Progression Note (Signed)
 Transition of Care [facility]) - Progression Note    Patient Details  Name: [name] MRN: [id number] Date of Birth: 15-Mar-1946  [facility]) CM/SW Contact  Mearl Latin, LCSW Phone Number: 07/09/2023, 12:34 PM  Clinical Narrative:    CSW received request for SNF placement. CSW left voicemail for [employer] to start authorization process; unsure if patient will qualify for SNF. FL2 completed and faxed out. Will update [facility] on facility choice once chosen.        Expected Discharge Plan and Services       Living arrangements for the past 2 months: Single Family Home Expected Discharge Date: 07/08/23                                     Social Determinants of Health (SDOH) Interventions SDOH Screenings   Food Insecurity: No Food Insecurity (07/04/2023)  Housing: Low Risk  (07/04/2023)  Transportation Needs: No Transportation Needs (07/04/2023)  Utilities: Not At Risk (07/04/2023)  Depression (PHQ2-9): Low Risk  (09/03/2018)  Financial Resource Strain: Low Risk  (01/03/2023)   Received from [facility]  Social Connections: Moderately Isolated (07/04/2023)  Stress: No Stress Concern Present (08/21/2018)  Tobacco Use: High Risk (07/04/2023)    Readmission Risk Interventions    04/28/2021    9:32 AM  Readmission Risk Prevention Plan  Transportation Screening Complete  PCP or Specialist Appt within 3-5 Days Complete  HRI or Home Care Consult Complete  Social Work Consult for Recovery Care Planning/Counseling Complete  Palliative Care Screening Not Applicable  Medication Review [employer]) Complete

## 2023-07-09 NOTE — [place] FL2 (Signed)
 [place] MEDICAID FL2 LEVEL OF CARE FORM     IDENTIFICATION  Patient Name: [name] Birthdate: 25-Dec-1945 Sex: male Admission Date (Current Location): 07/04/2023  [facility] and [place] Number:  Producer, television/film/video and Address:  [facility], [address], [place], [place] [address]      Provider Number: [id number]  Attending Physician Name and Address:  Leroy Sea, MD  Relative Name and Phone Number:       Current Level of Care: Hospital Recommended Level of Care: Skilled Nursing Facility Prior Approval Number:    Date Approved/Denied:   PASRR Number: [id number]  Discharge Plan: SNF    Current Diagnoses: Patient Active Problem List   Diagnosis Date Noted   Sepsis due to pneumonia (HCC) 07/04/2023   Thrombocytopenia (HCC) 01/17/2023   Acute on chronic hypoxic respiratory failure (HCC) 12/13/2022   COPD (chronic obstructive pulmonary disease) (HCC) 12/12/2022   Acute hypoxemic respiratory failure (HCC) 12/12/2022   Acute on chronic combined systolic and diastolic CHF (congestive heart failure) (HCC) 04/24/2021   Anemia 04/24/2021   Acute on chronic respiratory failure with hypoxia (HCC) 04/24/2021   Incarcerated inguinal hernia 03/18/2021   Chronic combined systolic and diastolic congestive heart failure (HCC)    Intraparenchymal hemorrhage of brain (HCC) 02/26/2021   SDH (subdural hematoma) (HCC) 02/21/2021   History of stroke 02/21/2021   Elevated troponin 02/21/2021   Other spondylosis with radiculopathy, lumbar region 09/04/2018   Centrilobular emphysema (HCC) 07/19/2018   Dilated cardiomyopathy (HCC) 07/19/2018   Coronary artery disease of native artery of native heart with stable angina pectoris (HCC) 01/20/2018   AAA (abdominal aortic aneurysm) without rupture (HCC) 10/30/2017   COPD exacerbation (HCC) 04/03/2015   Barrett's esophagus 05/20/2014   Constipation 05/20/2014   Arthritis of hand 05/07/2014   Pulmonary  nodule 11/06/2011   Chronic respiratory failure with hypoxia (HCC) 04/24/2011   Tobacco abuse 02/12/2011   CAD, NATIVE VESSEL/HLD 10/18/2008   COLONIC POLYPS 08/05/2008   Hyperlipidemia 08/05/2008   Essential hypertension 08/05/2008   GERD 08/05/2008    Orientation RESPIRATION BLADDER Height & Weight     Self, Time, Situation, Place  O2 (5L nasal cannula) Continent Weight: 176 lb 2.4 oz (79.9 kg) Height:  6' (182.9 cm)  BEHAVIORAL SYMPTOMS/MOOD NEUROLOGICAL BOWEL NUTRITION STATUS      Continent Diet (See dc summary)  AMBULATORY STATUS COMMUNICATION OF NEEDS Skin   Supervision Verbally Normal                       Personal Care Assistance Level of Assistance  Bathing, Feeding, Dressing Bathing Assistance: Limited assistance Feeding assistance: Independent Dressing Assistance: Limited assistance     Functional Limitations Info             SPECIAL CARE FACTORS FREQUENCY  PT (By licensed PT), OT (By licensed OT)     PT Frequency: 5x/week OT Frequency: 5x/week            Contractures Contractures Info: Not present    Additional Factors Info  Code Status, Allergies Code Status Info: Full Allergies Info: Lyrica           Current Medications (07/09/2023):  This is the current hospital active medication list Current Facility-Administered Medications  Medication Dose Route Frequency Provider Last Rate Last Admin   acetaminophen (TYLENOL) tablet 650 mg  650 mg Oral Q6H PRN Opyd, Lavone Neri, MD   650 mg at 07/04/23 2322   Or  acetaminophen (TYLENOL) suppository 650 mg  650 mg Rectal Q6H PRN Opyd, Lavone Neri, MD       arformoterol (BROVANA) nebulizer solution 15 mcg  15 mcg Nebulization BID Amin, Ankit C, MD   15 mcg at 07/09/23 0854   aspirin EC tablet 81 mg  81 mg Oral Daily Opyd, Lavone Neri, MD   81 mg at 07/09/23 0817   atorvastatin (LIPITOR) tablet 80 mg  80 mg Oral Daily Opyd, Lavone Neri, MD   80 mg at 07/09/23 0816   enoxaparin (LOVENOX) injection 40 mg  40  mg Subcutaneous Q24H Opyd, Lavone Neri, MD   40 mg at 07/08/23 0848   furosemide (LASIX) tablet 40 mg  40 mg Oral Daily Leroy Sea, MD   40 mg at 07/09/23 1206   guaiFENesin (ROBITUSSIN) 100 MG/5ML liquid 5 mL  5 mL Oral Q4H PRN Amin, Ankit C, MD   5 mL at 07/05/23 2133   hydrALAZINE (APRESOLINE) injection 10 mg  10 mg Intravenous Q4H PRN Amin, Ankit C, MD       ipratropium-albuterol (DUONEB) 0.5-2.5 (3) MG/3ML nebulizer solution 3 mL  3 mL Nebulization Q2H PRN Opyd, Lavone Neri, MD   3 mL at 07/07/23 0007   metoprolol tartrate (LOPRESSOR) injection 5 mg  5 mg Intravenous Q4H PRN Amin, Ankit C, MD       ondansetron (ZOFRAN) tablet 4 mg  4 mg Oral Q6H PRN Opyd, Lavone Neri, MD       Or   ondansetron (ZOFRAN) injection 4 mg  4 mg Intravenous Q6H PRN Opyd, Lavone Neri, MD       oxyCODONE (Oxy IR/ROXICODONE) immediate release tablet 5 mg  5 mg Oral Q6H PRN Opyd, Lavone Neri, MD   5 mg at 07/09/23 0816   pantoprazole (PROTONIX) EC tablet 40 mg  40 mg Oral Daily Opyd, Lavone Neri, MD   40 mg at 07/09/23 6962   revefenacin (YUPELRI) nebulizer solution 175 mcg  175 mcg Nebulization Daily Amin, Ankit C, MD   175 mcg at 07/09/23 0854   sacubitril-valsartan (ENTRESTO) 24-26 mg per tablet  1 tablet Oral BID Leroy Sea, MD   1 tablet at 07/09/23 0816   senna-docusate (Senokot-S) tablet 1 tablet  1 tablet Oral QHS PRN Opyd, Lavone Neri, MD       sodium chloride flush (NS) 0.9 % injection 3 mL  3 mL Intravenous Q12H Opyd, Lavone Neri, MD   3 mL at 07/08/23 2043   spironolactone (ALDACTONE) tablet 12.5 mg  12.5 mg Oral Daily Leroy Sea, MD   12.5 mg at 07/09/23 0816   traZODone (DESYREL) tablet 50 mg  50 mg Oral QHS PRN Miguel Rota, MD   50 mg at 07/06/23 2157     Discharge Medications: Please see discharge summary for a list of discharge medications.  Relevant Imaging Results:  Relevant Lab Results:   Additional Information SSN: [id number] [place], [place]

## 2023-07-09 NOTE — TOC Transition Note (Addendum)
 Transition of Care [facility]) - Discharge Note   Patient Details  Name: [name] MRN: [id number] Date of Birth: Nov 30, 1945  Transition [facility]) CM/SW Contact:  Gordy Clement, RN Phone Number: 07/09/2023, 9:28 AM  Update 9:38 AM  Just updated that Family reused to take home yesterday. Now looking at possible SNF placement  Will continue to follow     Clinical Narrative:     Patient will DC to home today Daughter in Law bringing portable O2 [facility] will provide services           Patient Goals and CMS Choice            Discharge Placement                       Discharge Plan and Services Additional resources added to the After Visit Summary for                                       Social Drivers of Health (SDOH) Interventions SDOH Screenings   Food Insecurity: No Food Insecurity (07/04/2023)  Housing: Low Risk  (07/04/2023)  Transportation Needs: No Transportation Needs (07/04/2023)  Utilities: Not At Risk (07/04/2023)  Depression (PHQ2-9): Low Risk  (09/03/2018)  Financial Resource Strain: Low Risk  (01/03/2023)   Received from [facility]  Social Connections: Moderately Isolated (07/04/2023)  Stress: No Stress Concern Present (08/21/2018)  Tobacco Use: High Risk (07/04/2023)     Readmission Risk Interventions    04/28/2021    9:32 AM  Readmission Risk Prevention Plan  Transportation Screening Complete  PCP or Specialist Appt within 3-5 Days Complete  HRI or Home Care Consult Complete  Social Work Consult for Recovery Care Planning/Counseling Complete  Palliative Care Screening Not Applicable  Medication Review Oceanographer) Complete

## 2023-07-10 DIAGNOSIS — J189 Pneumonia, unspecified organism: Secondary | ICD-10-CM | POA: Diagnosis not present

## 2023-07-10 DIAGNOSIS — A419 Sepsis, unspecified organism: Secondary | ICD-10-CM | POA: Diagnosis not present

## 2023-07-10 LAB — BASIC METABOLIC PANEL
Anion gap: 6 (ref 5–15)
BUN: 22 mg/dL (ref 8–23)
CO2: 34 mmol/L — ABNORMAL HIGH (ref 22–32)
Calcium: 8 mg/dL — ABNORMAL LOW (ref 8.9–10.3)
Chloride: 103 mmol/L (ref 98–111)
Creatinine, Ser: 1.16 mg/dL (ref 0.61–1.24)
GFR, Estimated: >60 mL/min (ref >60)
Glucose, Bld: 88 mg/dL (ref 70–99)
Potassium: 3.6 mmol/L (ref 3.5–5.1)
Sodium: 143 mmol/L (ref 135–145)

## 2023-07-10 LAB — CBC WITH DIFFERENTIAL/PLATELET
Abs Immature Granulocytes: 0.08 10*3/uL — ABNORMAL HIGH (ref 0.00–0.07)
Basophils Absolute: 0 10*3/uL (ref 0.0–0.1)
Basophils Relative: 0 %
Eosinophils Absolute: 0.3 10*3/uL (ref 0.0–0.5)
Eosinophils Relative: 4 %
HCT: 39.4 % (ref 39.0–52.0)
Hemoglobin: 12.8 g/dL — ABNORMAL LOW (ref 13.0–17.0)
Immature Granulocytes: 1 %
Lymphocytes Relative: 19 %
Lymphs Abs: 1.6 10*3/uL (ref 0.7–4.0)
MCH: 30.9 pg (ref 26.0–34.0)
MCHC: 32.5 g/dL (ref 30.0–36.0)
MCV: 95.2 fL (ref 80.0–100.0)
Monocytes Absolute: 0.6 10*3/uL (ref 0.1–1.0)
Monocytes Relative: 7 %
Neutro Abs: 6.1 10*3/uL (ref 1.7–7.7)
Neutrophils Relative %: 69 %
Platelets: 139 10*3/uL — ABNORMAL LOW (ref 150–400)
RBC: 4.14 MIL/uL — ABNORMAL LOW (ref 4.22–5.81)
RDW: 12.7 % (ref 11.5–15.5)
WBC: 8.8 10*3/uL (ref 4.0–10.5)
nRBC: 0 % (ref 0.0–0.2)

## 2023-07-10 LAB — MAGNESIUM: Magnesium: 1.7 mg/dL (ref 1.7–2.4)

## 2023-07-10 MED ORDER — MAGNESIUM SULFATE 2 GM/50ML IV SOLN
2.0000 g | Freq: Once | INTRAVENOUS | Status: AC
Start: 2023-07-10 — End: 2023-07-10
  Administered 2023-07-10: 2 g via INTRAVENOUS
  Filled 2023-07-10: qty 50

## 2023-07-10 NOTE — Progress Notes (Signed)
 PROGRESS NOTE    [name]  ZOX:[id number] DOB: July 15, 1945 DOA: 07/04/2023 PCP: Danella Penton, MD    Brief Narrative:   78 year old with history of HTN, HLD, COPD, chronic hypoxia, AAA status post endovascular repair, CAD, chronic CHF with reduced EF 45% admitted for cough, shortness of breath.  Upon evaluation concerns of community-acquired pneumonia and COPD exacerbation.  Slowly improving on bronchodilators, steroids, Rocephin/azithromycin.  Assessment & Plan:   Severe sepsis and acute on chronic hypoxic respiratory failure secondary to community-acquired pneumonia and acute COPD exacerbation, POA: He appears to have left-sided community-acquired pneumonia, seen and cleared by speech, COVID/flu/RSV, RVP-negativ, Continue bronchodilators, I-S/flutter valve, counseled to quit smoking, finished empiric antibiotic treatment with Rocephin and azithromycin, cultures thus far negative continue to follow.  He is on 4 to 6 L of oxygen at home at baseline, encouraged him to sit in chair use I-S and flutter valve for pulmonary toiletry, advance activity and titrate down oxygen.  Sepsis pathophysiology has resolved.  SpO2: 95 % O2 Flow Rate (L/min): 4 L/min FiO2 (%): 36 %   Chronic HFmrEF  EF was 40-45% on echo from August 2024 .  Overall compensated in nature.  Currently home medications are on hold until sepsis physiology stabilizes.  For now on heart healthy diet will fluid restriction, dose of IV Lasix on 07/07/2023.  Cautiously resume home CHF medications.  Hypokalemia, hypophosphatemia - As needed repletion   Essential hypertension:  Improved Home medications will be resumed.  Monitor diuretic dose closely.   CAD/hyperlipidemia Denies chest pain.  On aspirin and statin   Hx of CVA  ASA, Lipitor     Ongoing smoking.  Counseled to quit.    Weakness and deconditioning.  PT OT, was discharged with home PT on 07/08/2023, however family not comfortable taking him home, will request  TOC to look at other options as well, PT to reevaluate again on 07/09/2023.    Acute thrombocytopenia:  likely in the setting of severe sepsis.  No bleeding.  Monitor closely.   GERD: PPI.  PT/OT-pending  DVT prophylaxis: Lovenox    Code Status: Full Code Family Communication:   Status is: Inpatient    Subjective: Patient in bed, appears comfortable, denies any headache, no fever, no chest pain or pressure, no shortness of breath , no abdominal pain. No focal weakness.  Vitals:   07/10/23 0751 07/10/23 0800 07/10/23 0855 07/10/23 0954  BP:  (!) 113/56 (!) 110/94 (!) 107/53  Pulse: 68 60 71 77  Temp:   98 F (36.7 C)   Resp: 14 17 20 19   Height:      Weight:      SpO2:  98% 94% 95%  TempSrc:   Oral   BMI (Calculated):        Examination:  Awake Alert, No new F.N deficits, Normal affect [place].AT,PERRAL Supple Neck, No JVD,   Symmetrical Chest wall movement, moderate air movement bilaterally with coarse bilateral breath sounds RRR,No Gallops, Rubs or new Murmurs,  +ve B.Sounds, Abd Soft, No tenderness,   No Cyanosis, Clubbing or edema     Diet Orders (From admission, onward)     Start     Ordered   07/04/23 0536  Diet Heart Room service appropriate? Yes; Fluid consistency: Thin  Diet effective now       Question Answer Comment  Room service appropriate? Yes   Fluid consistency: Thin      07/04/23 0536  Data Review:   Inpatient Medications  Scheduled Meds:  arformoterol  15 mcg Nebulization BID   aspirin EC  81 mg Oral Daily   atorvastatin  80 mg Oral Daily   enoxaparin (LOVENOX) injection  40 mg Subcutaneous Q24H   furosemide  40 mg Oral Daily   pantoprazole  40 mg Oral Daily   revefenacin  175 mcg Nebulization Daily   sacubitril-valsartan  1 tablet Oral BID   sodium chloride flush  3 mL Intravenous Q12H   spironolactone  12.5 mg Oral Daily   Continuous Infusions:   PRN Meds:.acetaminophen **OR** acetaminophen, guaiFENesin, hydrALAZINE,  ipratropium-albuterol, metoprolol tartrate, ondansetron **OR** ondansetron (ZOFRAN) IV, oxyCODONE, senna-docusate, traZODone  DVT Prophylaxis  enoxaparin (LOVENOX) injection 40 mg Start: 07/04/23 1000  Recent Labs  Lab 07/04/23 0126 07/04/23 0140 07/05/23 0501 07/06/23 0400 07/07/23 0538 07/08/23 0820 07/10/23 0459  WBC 18.9*  --  18.2* 14.4* 9.6 7.2 8.8  HGB 14.1   < > 11.7* 11.6* 11.3* 12.4* 12.8*  HCT 44.6   < > 37.3* 35.6* 35.0* 38.1* 39.4  PLT 144*  --  125* 145* 137* 145* 139*  MCV 99.6  --  96.9 96.5 97.0 94.3 95.2  MCH 31.5  --  30.4 31.4 31.3 30.7 30.9  MCHC 31.6  --  31.4 32.6 32.3 32.5 32.5  RDW 12.8  --  13.0 13.0 12.8 12.7 12.7  LYMPHSABS 0.4*  --   --   --  0.8  --  1.6  MONOABS 0.8  --   --   --  0.4  --  0.6  EOSABS 0.0  --   --   --  0.0  --  0.3  BASOSABS 0.0  --   --   --  0.0  --  0.0   < > = values in this interval not displayed.    Recent Labs  Lab 07/04/23 0126 07/04/23 0134 07/04/23 0140 07/04/23 0254 07/04/23 0350 07/04/23 1155 07/04/23 1800 07/05/23 0501 07/06/23 0400 07/07/23 0538 07/08/23 0541 07/10/23 0459  NA 140  --    < >  --   --   --   --  139 141 140 141 143  K 3.5  --    < >  --   --   --   --  3.6 3.4* 3.8 4.2 3.6  CL 103  --   --   --   --   --   --  105 104 105 106 103  CO2 26  --   --   --   --   --   --  29 29 29 25  34*  ANIONGAP 11  --   --   --   --   --   --  5 8 6 10 6   GLUCOSE 155*  --   --   --   --   --   --  134* 105* 93 82 88  BUN 16  --   --   --   --   --   --  21 24* 24* 24* 22  CREATININE 1.38*  --   --   --   --   --   --  1.17 1.10 1.10 1.06 1.16  AST 19  --   --   --   --   --   --   --   --   --   --   --   ALT 12  --   --   --   --   --   --   --   --   --   --   --  ALKPHOS 64  --   --   --   --   --   --   --   --   --   --   --   BILITOT 1.5*  --   --   --   --   --   --   --   --   --   --   --   ALBUMIN 3.2*  --   --   --   --   --   --   --   --   --   --   --   LATICACIDVEN  --  2.8*  --   --   2.4* 2.2* 1.1  --   --   --   --   --   INR 1.1  --   --   --   --   --   --   --   --   --   --   --   BNP  --   --   --  384.6*  --   --   --   --   --  1,463.7*  --   --   MG  --   --   --   --   --   --   --  1.7 1.9 1.9  --  1.7  PHOS  --   --   --   --   --   --   --   --   --  2.4*  --   --   CALCIUM 8.3*  --   --   --   --   --   --  8.2* 8.3* 8.3* 8.0* 8.0*   < > = values in this interval not displayed.      Recent Labs  Lab 07/04/23 0126 07/04/23 0134 07/04/23 0254 07/04/23 0350 07/04/23 1155 07/04/23 1800 07/05/23 0501 07/06/23 0400 07/07/23 0538 07/08/23 0541 07/10/23 0459  LATICACIDVEN  --  2.8*  --  2.4* 2.2* 1.1  --   --   --   --   --   INR 1.1  --   --   --   --   --   --   --   --   --   --   BNP  --   --  384.6*  --   --   --   --   --  1,463.7*  --   --   MG  --   --   --   --   --   --  1.7 1.9 1.9  --  1.7  CALCIUM 8.3*  --   --   --   --   --  8.2* 8.3* 8.3* 8.0* 8.0*     Micro Results Recent Results (from the past 240 hours)  Culture, blood (Routine x 2)     Status: None   Collection Time: 07/04/23  1:26 AM   Specimen: BLOOD RIGHT WRIST  Result Value Ref Range Status   Specimen Description BLOOD RIGHT WRIST  Final   Special Requests   Final    BOTTLES DRAWN AEROBIC AND ANAEROBIC Blood Culture results may not be optimal due to an inadequate volume of blood received in culture bottles   Culture   Final    NO GROWTH 5 DAYS Performed at [facility], [address], [place], [place] [address]    Report  Status 07/09/2023 FINAL  Final  Culture, blood (Routine x 2)     Status: None   Collection Time: 07/04/23  1:32 AM   Specimen: BLOOD LEFT WRIST  Result Value Ref Range Status   Specimen Description BLOOD LEFT WRIST  Final   Special Requests   Final    BOTTLES DRAWN AEROBIC AND ANAEROBIC Blood Culture adequate volume   Culture   Final    NO GROWTH 5 DAYS Performed at [facility], [address] [place], [place] [address]    Report  Status 07/09/2023 FINAL  Final  Resp panel by RT-PCR (RSV, Flu A&B, Covid) Anterior Nasal Swab     Status: None   Collection Time: 07/04/23  2:38 AM   Specimen: Anterior Nasal Swab  Result Value Ref Range Status   SARS Coronavirus 2 by RT PCR NEGATIVE NEGATIVE Final   Influenza A by PCR NEGATIVE NEGATIVE Final   Influenza B by PCR NEGATIVE NEGATIVE Final    Comment: (NOTE) The Xpert Xpress SARS-CoV-2/FLU/RSV plus assay is intended as an aid in the diagnosis of influenza from Nasopharyngeal swab specimens and should not be used as a sole basis for treatment. Nasal washings and aspirates are unacceptable for Xpert Xpress SARS-CoV-2/FLU/RSV testing.  Fact Sheet for Patients: BloggerCourse.com  Fact Sheet for Healthcare Providers: SeriousBroker.it  This test is not yet approved or cleared by the [place] FDA and has been authorized for detection and/or diagnosis of SARS-CoV-2 by FDA under an Emergency Use Authorization (EUA). This EUA will remain in effect (meaning this test can be used) for the duration of the COVID-19 declaration under Section 564(b)(1) of the Act, 21 U.S.C. section 360bbb-3(b)(1), unless the authorization is terminated or revoked.     Resp Syncytial Virus by PCR NEGATIVE NEGATIVE Final    Comment: (NOTE) Fact Sheet for Patients: BloggerCourse.com  Fact Sheet for Healthcare Providers: SeriousBroker.it  This test is not yet approved or cleared by the [place] FDA and has been authorized for detection and/or diagnosis of SARS-CoV-2 by FDA under an Emergency Use Authorization (EUA). This EUA will remain in effect (meaning this test can be used) for the duration of the COVID-19 declaration under Section 564(b)(1) of the Act, 21 U.S.C. section 360bbb-3(b)(1), unless the authorization is terminated or revoked.  Performed at [facility], [address] [place], [place] [address]   Respiratory (~20 pathogens) panel by PCR     Status: None   Collection Time: 07/04/23  8:58 AM   Specimen: Nasopharyngeal Swab; Respiratory  Result Value Ref Range Status   Adenovirus NOT DETECTED NOT DETECTED Final   Coronavirus 229E NOT DETECTED NOT DETECTED Final    Comment: (NOTE) The Coronavirus on the Respiratory Panel, DOES NOT test for the novel  Coronavirus (2019 nCoV)    Coronavirus HKU1 NOT DETECTED NOT DETECTED Final   Coronavirus NL63 NOT DETECTED NOT DETECTED Final   Coronavirus OC43 NOT DETECTED NOT DETECTED Final   Metapneumovirus NOT DETECTED NOT DETECTED Final   Rhinovirus / Enterovirus NOT DETECTED NOT DETECTED Final   Influenza A NOT DETECTED NOT DETECTED Final   Influenza B NOT DETECTED NOT DETECTED Final   Parainfluenza Virus 1 NOT DETECTED NOT DETECTED Final   Parainfluenza Virus 2 NOT DETECTED NOT DETECTED Final   Parainfluenza Virus 3 NOT DETECTED NOT DETECTED Final   Parainfluenza Virus 4 NOT DETECTED NOT DETECTED Final   Respiratory Syncytial Virus NOT DETECTED NOT DETECTED Final   Bordetella pertussis NOT DETECTED  NOT DETECTED Final   Bordetella Parapertussis NOT DETECTED NOT DETECTED Final   Chlamydophila pneumoniae NOT DETECTED NOT DETECTED Final   Mycoplasma pneumoniae NOT DETECTED NOT DETECTED Final    Comment: Performed at [facility], [address] [place], [place] [address]    Radiology Reports  No results found.     Signature  -   Susa Raring M.D on 07/10/2023 at 11:08 AM   -  To page go to [email or web address]

## 2023-07-10 NOTE — Plan of Care (Signed)
 progressing

## 2023-07-10 NOTE — TOC Progression Note (Addendum)
 Transition of Care [facility]) - Progression Note    Patient Details  Name: [name] MRN: [id number] Date of Birth: 21-Feb-1946  Transition [facility] Care [facility]) CM/SW Contact  Mearl Latin, LCSW Phone Number: 07/10/2023, 11:08 AM  Clinical Narrative:    10am-CSW received call from [facility] stating they will begin the authorization process.  CSW spoke with patient's daughter in [place], [phone], and provided SNF options. She will review with patient's son.   1:43 PM-Jennifer called and requested [facility] but [facility] not able to accept patient. [facility] is able to [facility] patient and [facility] is agreeable as her son does lawn care there frequently. CSW updated [facility].   Expected Discharge Plan: Skilled Nursing Facility Barriers to Discharge: Insurance Authorization  Expected Discharge Plan and Services In-house Referral: Clinical Social Work Discharge Planning Services: CM Consult Post Acute Care Choice: Skilled Nursing Facility Living arrangements for the past 2 months: Single Family Home Expected Discharge Date: 07/08/23                                     Social Determinants of Health (SDOH) Interventions SDOH Screenings   Food Insecurity: No Food Insecurity (07/04/2023)  Housing: Low Risk  (07/04/2023)  Transportation Needs: No Transportation Needs (07/04/2023)  Utilities: Not At Risk (07/04/2023)  Depression (PHQ2-9): Low Risk  (09/03/2018)  Financial Resource Strain: Low Risk  (01/03/2023)   Received from [facility]  Social Connections: Moderately Isolated (07/04/2023)  Stress: No Stress Concern Present (08/21/2018)  Tobacco Use: High Risk (07/04/2023)    Readmission Risk Interventions    04/28/2021    9:32 AM  Readmission Risk Prevention Plan  Transportation Screening Complete  PCP or Specialist Appt within 3-5 Days Complete  HRI or Home Care Consult Complete  Social Work Consult for Recovery Care  Planning/Counseling Complete  Palliative Care Screening Not Applicable  Medication Review Oceanographer) Complete

## 2023-07-10 NOTE — Progress Notes (Signed)
 OT Cancellation Note  Patient Details Name: [name] MRN: [id number] DOB: 1946/04/14   Cancelled Treatment:    Reason Eval/Treat Not Completed: Patient not medically ready (Nursing states the patient is demonstrating low BP and asked for OT to return after patient receives meds. OT to attempt later as schedule permits.) Alfonse Flavors, OTA Acute Rehabilitation Services  Office [phone]  Dewain Penning 07/10/2023, 8:55 AM

## 2023-07-10 NOTE — Progress Notes (Signed)
 Occupational Therapy Treatment Patient Details Name: [name] MRN: [id number] DOB: 12-24-1945 Today's Date: 07/10/2023   History of present illness Pt is a 78 yo male that [facility] to [facility] on 07/04[facility]23 for cough and SOB, work up for sepsis secondary to pneumonia. PMH of COPD stage II, anxiety, stroke, neuromuscular disorder,depression, HTN, prostate cancer s/p seed implantation, HLD, lumbar disc disease, CAD, AAA , intraparenchymal hemorrhage in 02/2021, , nephrolithiasis, GERD, tobacco use and chronic ischemic heart failure.   OT comments  Pt received in supine, nurse communicated problems with BP prior to session. BP monitored throughout. Pt able to come to EOB and tolerate sitting. Sitting at EOB pt able to complete simple grooming task. Pt completed LB dressing with pants, requiring a little assistance to thread L foot in pant hole, but ultimately able to complete and pull up once in standing. Pt performed a few sit<>stands and encouraged to complete seated marches to maintain BP. BP recorded: 87/52 in supine, 74/54 at EOB, 90/51 after standing, 97/56 seated, 75/36 standing, 107/53 supine. Acute OT to continue to follow to address established goals to facilitate DC to next venue of care.        If plan is discharge home, recommend the following:  Assistance with cooking/housework;Assist for transportation   Equipment Recommendations  None recommended by OT    Recommendations for Other Services      Precautions / Restrictions Precautions Precautions: Fall Recall of Precautions/Restrictions: Intact Restrictions Weight Bearing Restrictions Per Provider Order: No       Mobility Bed Mobility Overal bed mobility: Needs Assistance Bed Mobility: Supine to Sit     Supine to sit: Supervision     General bed mobility comments: able to bring self to EOB    Transfers Overall transfer level: Needs assistance Equipment used: Rolling walker (2 wheels) Transfers: Sit to/from  Stand Sit to Stand: Contact guard assist           General transfer comment: CGA for safety using RW for support     Balance Overall balance assessment: Needs assistance Sitting-balance support: No upper extremity supported, Feet supported Sitting balance-Leahy Scale: Good     Standing balance support: Bilateral upper extremity supported, During functional activity Standing balance-Leahy Scale: Poor Standing balance comment: Dependent on RW for support.                           ADL either performed or assessed with clinical judgement   ADL Overall ADL's : Needs assistance/impaired     Grooming: Brushing hair;Supervision/safety;Sitting Grooming Details (indicate cue type and reason): combed hair EOB             Lower Body Dressing: Minimal assistance;Sitting/lateral leans Lower Body Dressing Details (indicate cue type and reason): pt able to don pants with min assist to thread L foot through pant hole, able to pull them up once in standing             Functional mobility during ADLs: Contact guard assist;Rolling walker (2 wheels) General ADL Comments: grooming and LB dressing completed, limited activity due to BP, values listed below    Extremity/Trunk Assessment              Vision       Perception     Praxis     Communication Communication Communication: No apparent difficulties   Cognition Arousal: Alert Behavior During Therapy: WFL for tasks assessed/performed Cognition: No apparent impairments  Following commands: Intact        Cueing   Cueing Techniques: Verbal cues  Exercises      Shoulder Instructions       General Comments pt limited due to orthostatics. able to complete some grooming EOB. Following BP recorded, 87/52 supine, 74/54 EOB, 90/51 after standing, 97/56 seated, 75/36 standing, 107/53 supine    Pertinent Vitals/ Pain       Pain Assessment Pain Assessment:  No/denies pain  Home Living                                          Prior Functioning/Environment              Frequency  Min 2X/week        Progress Toward Goals  OT Goals(current goals can now be found in the care plan section)  Progress towards OT goals: Progressing toward goals  Acute Rehab OT Goals Patient Stated Goal: get better OT Goal Formulation: With patient Time For Goal Achievement: 07/20/23 Potential to Achieve Goals: Good ADL Goals Pt Will Perform Grooming: with modified independence;standing Pt Will Perform Lower Body Bathing: with modified independence;sit to/from stand Pt Will Transfer to Toilet: with modified independence;ambulating Pt Will Perform Toileting - Clothing Manipulation and hygiene: with modified independence;sit to/from stand  Plan      Co-evaluation                 AM-PAC OT "6 Clicks" Daily Activity     Outcome Measure   Help from another person eating meals?: None Help from another person taking care of personal grooming?: A Little Help from another person toileting, which includes using toliet, bedpan, or urinal?: A Little Help from another person bathing (including washing, rinsing, drying)?: A Little Help from another person to put on and taking off regular upper body clothing?: A Little Help from another person to put on and taking off regular lower body clothing?: A Little 6 Click Score: 19    End of Session Equipment Utilized During Treatment: Gait belt;Rolling walker (2 wheels)  OT Visit Diagnosis: Unsteadiness on feet (R26.81);Other (comment)   Activity Tolerance Patient tolerated treatment well   Patient Left in bed;with call bell/phone within reach   Nurse Communication Mobility status;Other (comment) (orthostatics communicated)        Time: 1610-9604 OT Time Calculation (min): 26 min  Charges: OT General Charges $OT Visit: 1 Visit OT Treatments $Self Care/Home Management : 8-22  mins $Therapeutic Activity: 8-22 mins  Christyl Osentoski, BS, OTA/S   [name] 07/10/2023, 12:08 PM

## 2023-07-11 DIAGNOSIS — R2689 Other abnormalities of gait and mobility: Secondary | ICD-10-CM | POA: Diagnosis not present

## 2023-07-11 DIAGNOSIS — I5022 Chronic systolic (congestive) heart failure: Secondary | ICD-10-CM | POA: Diagnosis not present

## 2023-07-11 DIAGNOSIS — Z8673 Personal history of transient ischemic attack (TIA), and cerebral infarction without residual deficits: Secondary | ICD-10-CM | POA: Diagnosis not present

## 2023-07-11 DIAGNOSIS — R278 Other lack of coordination: Secondary | ICD-10-CM | POA: Diagnosis not present

## 2023-07-11 DIAGNOSIS — W19XXXA Unspecified fall, initial encounter: Secondary | ICD-10-CM | POA: Diagnosis not present

## 2023-07-11 DIAGNOSIS — E876 Hypokalemia: Secondary | ICD-10-CM | POA: Diagnosis not present

## 2023-07-11 DIAGNOSIS — R6520 Severe sepsis without septic shock: Secondary | ICD-10-CM | POA: Diagnosis not present

## 2023-07-11 DIAGNOSIS — J9611 Chronic respiratory failure with hypoxia: Secondary | ICD-10-CM | POA: Diagnosis not present

## 2023-07-11 DIAGNOSIS — M6281 Muscle weakness (generalized): Secondary | ICD-10-CM | POA: Diagnosis not present

## 2023-07-11 DIAGNOSIS — M5020 Other cervical disc displacement, unspecified cervical region: Secondary | ICD-10-CM | POA: Diagnosis not present

## 2023-07-11 DIAGNOSIS — I10 Essential (primary) hypertension: Secondary | ICD-10-CM | POA: Diagnosis not present

## 2023-07-11 DIAGNOSIS — A419 Sepsis, unspecified organism: Secondary | ICD-10-CM | POA: Diagnosis not present

## 2023-07-11 DIAGNOSIS — I5040 Unspecified combined systolic (congestive) and diastolic (congestive) heart failure: Secondary | ICD-10-CM | POA: Diagnosis not present

## 2023-07-11 DIAGNOSIS — J449 Chronic obstructive pulmonary disease, unspecified: Secondary | ICD-10-CM | POA: Diagnosis not present

## 2023-07-11 DIAGNOSIS — J189 Pneumonia, unspecified organism: Secondary | ICD-10-CM | POA: Diagnosis not present

## 2023-07-11 DIAGNOSIS — S0990XA Unspecified injury of head, initial encounter: Secondary | ICD-10-CM | POA: Diagnosis present

## 2023-07-11 DIAGNOSIS — R1312 Dysphagia, oropharyngeal phase: Secondary | ICD-10-CM | POA: Diagnosis not present

## 2023-07-11 DIAGNOSIS — S72002D Fracture of unspecified part of neck of left femur, subsequent encounter for closed fracture with routine healing: Secondary | ICD-10-CM | POA: Diagnosis not present

## 2023-07-11 DIAGNOSIS — G9389 Other specified disorders of brain: Secondary | ICD-10-CM | POA: Diagnosis not present

## 2023-07-11 DIAGNOSIS — Z720 Tobacco use: Secondary | ICD-10-CM | POA: Diagnosis not present

## 2023-07-11 DIAGNOSIS — E785 Hyperlipidemia, unspecified: Secondary | ICD-10-CM | POA: Diagnosis not present

## 2023-07-11 DIAGNOSIS — I5042 Chronic combined systolic (congestive) and diastolic (congestive) heart failure: Secondary | ICD-10-CM | POA: Diagnosis not present

## 2023-07-11 DIAGNOSIS — J441 Chronic obstructive pulmonary disease with (acute) exacerbation: Secondary | ICD-10-CM | POA: Diagnosis not present

## 2023-07-11 DIAGNOSIS — Z9181 History of falling: Secondary | ICD-10-CM | POA: Diagnosis not present

## 2023-07-11 DIAGNOSIS — S0081XA Abrasion of other part of head, initial encounter: Secondary | ICD-10-CM | POA: Diagnosis not present

## 2023-07-11 DIAGNOSIS — I2510 Atherosclerotic heart disease of native coronary artery without angina pectoris: Secondary | ICD-10-CM | POA: Diagnosis not present

## 2023-07-11 DIAGNOSIS — Z87820 Personal history of traumatic brain injury: Secondary | ICD-10-CM | POA: Diagnosis not present

## 2023-07-11 DIAGNOSIS — Z7185 Encounter for immunization safety counseling: Secondary | ICD-10-CM | POA: Diagnosis not present

## 2023-07-11 DIAGNOSIS — Z23 Encounter for immunization: Secondary | ICD-10-CM | POA: Diagnosis not present

## 2023-07-11 DIAGNOSIS — I6782 Cerebral ischemia: Secondary | ICD-10-CM | POA: Diagnosis not present

## 2023-07-11 DIAGNOSIS — M47811 Spondylosis without myelopathy or radiculopathy, occipito-atlanto-axial region: Secondary | ICD-10-CM | POA: Diagnosis not present

## 2023-07-11 DIAGNOSIS — W06XXXA Fall from bed, initial encounter: Secondary | ICD-10-CM | POA: Diagnosis not present

## 2023-07-11 DIAGNOSIS — Z8546 Personal history of malignant neoplasm of prostate: Secondary | ICD-10-CM | POA: Diagnosis not present

## 2023-07-11 DIAGNOSIS — J9621 Acute and chronic respiratory failure with hypoxia: Secondary | ICD-10-CM | POA: Diagnosis not present

## 2023-07-11 LAB — GLUCOSE, CAPILLARY: Glucose-Capillary: 229 mg/dL — ABNORMAL HIGH (ref 70–99)

## 2023-07-11 LAB — CREATININE, SERUM
Creatinine, Ser: 1.09 mg/dL (ref 0.61–1.24)
GFR, Estimated: >60 mL/min (ref >60)

## 2023-07-11 MED ORDER — MIDODRINE HCL 5 MG PO TABS
5.0000 mg | ORAL_TABLET | Freq: Three times a day (TID) | ORAL | Status: DC
  Administered 2023-07-11 (×2): 5 mg via ORAL
  Filled 2023-07-11 (×2): qty 1

## 2023-07-11 MED ORDER — MIDODRINE HCL 5 MG PO TABS: 5.0000 mg | ORAL_TABLET | Freq: Two times a day (BID) | ORAL | Status: DC

## 2023-07-11 MED ORDER — METHYLPREDNISOLONE SODIUM SUCC 40 MG IJ SOLR
40.0000 mg | INTRAMUSCULAR | Status: DC
  Administered 2023-07-11: 40 mg via INTRAVENOUS
  Filled 2023-07-11: qty 1

## 2023-07-11 NOTE — Plan of Care (Signed)

## 2023-07-11 NOTE — Plan of Care (Signed)
   Problem: Education: Goal: Knowledge of General Education information will improve Description Including pain rating scale, medication(s)/side effects and non-pharmacologic comfort measures Outcome: Progressing

## 2023-07-11 NOTE — Progress Notes (Signed)
 Report called to [facility] at 17:15 tt Jon Gills, RN.  All questions and concerns addressed.   18:00 family at bedside to transport pt to facility.  All questions and concerns addressed with family and pt.  Discharge paperwork and meds packed up and given to family with instructions to give to nurse at facility.  Pt was alert and oriented at time of discharge with no needs voiced.  Pt placed on home portable oxygen for transport and escorted to front door by staff via wheelchair.

## 2023-07-11 NOTE — TOC Progression Note (Addendum)
 Transition of Care [facility]) - Progression Note    Patient Details  Name: [name] MRN: [id number] Date of Birth: 09-May-1945  Transition of Care [facility]) CM/SW Contact  Mearl Latin, LCSW Phone Number: 07/11/2023, 10:10 AM  Clinical Narrative:    10:10 AM-Awaiting insurance approval for [facility]. Per MD, he spoke with Dr. Logan Bores who will approve the SNF stay.  12:58 PM-CSW received call from Gavin Pound at [facility] who stated they now have to halt admissions until an incident is resolved. CSW updated daughter in law, Victorino Dike, who is going to decide between [facility] and [facility] and call CSW right back. Awaiting official insurance determination from [facility].  1:28 PM-CSW received call from [facility] stating patient has been approved for SNF for 7 days, Auth# [id number]. Ambulance transport has been denied. Will update [facility] with new SNF selection once [place] calls back.  2:07 PM-CSW received return call from [place] stating that [facility] is hoping to have admissions tomorrow. CSW explained patient has been discharged for several days and needs to discharge today. She reported understanding and has selected [facility]. CSW updated Phineas Semen and Inetta Fermo with [facility]. Family to transport patient around 6pm in the car.   Expected Discharge Plan: Skilled Nursing Facility Barriers to Discharge: Insurance Authorization  Expected Discharge Plan and Services In-house Referral: Clinical Social Work Discharge Planning Services: CM Consult Post Acute Care Choice: Skilled Nursing Facility Living arrangements for the past 2 months: Single Family Home Expected Discharge Date: 07/08/23                                     Social Determinants of Health (SDOH) Interventions SDOH Screenings   Food Insecurity: No Food Insecurity (07/04/2023)  Housing: Low Risk  (07/04/2023)  Transportation Needs: No Transportation Needs (07/04/2023)  Utilities: Not At Risk (07/04/2023)   Depression (PHQ2-9): Low Risk  (09/03/2018)  Financial Resource Strain: Low Risk  (01/03/2023)   Received from [facility]  Social Connections: Moderately Isolated (07/04/2023)  Stress: No Stress Concern Present (08/21/2018)  Tobacco Use: High Risk (07/04/2023)    Readmission Risk Interventions    04/28/2021    9:32 AM  Readmission Risk Prevention Plan  Transportation Screening Complete  PCP or Specialist Appt within 3-5 Days Complete  HRI or Home Care Consult Complete  Social Work Consult for Recovery Care Planning/Counseling Complete  Palliative Care Screening Not Applicable  Medication Review Oceanographer) Complete

## 2023-07-11 NOTE — Progress Notes (Signed)
 Physical Therapy Treatment Patient Details Name: [name] MRN: [id number] DOB: Jul 20, 1945 Today's Date: 07/11/2023   History of Present Illness Pt is a 78 yo male that presents to [facility] on 07/04/23 for cough and SOB, work up for sepsis secondary to pneumonia. PMH of COPD stage II, anxiety, stroke, neuromuscular disorder,depression, HTN, prostate cancer s/p seed implantation, HLD, lumbar disc disease, CAD, AAA , intraparenchymal hemorrhage in 02/2021, , nephrolithiasis, GERD, tobacco use and chronic ischemic heart failure.    PT Comments  Patient is agreeable to PT session. He is hopeful for discharge to rehab soon. He required Min A for initial lift assistance to stand from bed. Hallway ambulation with CGA using rolling walker education on energy conservation strategies and standing closer to rolling walker for support and safety. One short standing rest break required. Sp02 93-94% on 6 L02. No significant dyspnea with exertion. Recommend to continue PT to maximize independence and facilitate return to prior level of function. Rehabilitation < 3 hours/day  recommended after this hospital stay.    If plan is discharge home, recommend the following: A little help with walking and/or transfers;A little help with bathing/dressing/bathroom;Assistance with cooking/housework;Assist for transportation;Help with stairs or ramp for entrance   Can travel by private vehicle     Yes  Equipment Recommendations  None recommended by PT    Recommendations for Other Services       Precautions / Restrictions Precautions Precautions: Fall Recall of Precautions/Restrictions: Intact Restrictions Weight Bearing Restrictions Per Provider Order: No     Mobility  Bed Mobility Overal bed mobility: Needs Assistance Bed Mobility: Supine to Sit, Sit to Supine     Supine to sit: Supervision Sit to supine: Supervision   General bed mobility comments: increased time required    Transfers Overall transfer  level: Needs assistance Equipment used: Rolling walker (2 wheels) Transfers: Sit to/from Stand Sit to Stand: Min assist           General transfer comment: initial lifting assistance required for standing. reinfrocement of hand placement for safety    Ambulation/Gait Ambulation/Gait assistance: Contact guard assist Gait Distance (Feet): 120 Feet Assistive device: Rolling walker (2 wheels) Gait Pattern/deviations: Step-through pattern, Trunk flexed, Narrow base of support, Decreased stride length Gait velocity: decreased     General Gait Details: increased flexed posture with fatigue. cues to stand closer to rolling walker for support. Sp02 93-94% on 6 L02 with ambulation (he prefers the fask mask for walking instead of nasal canula). one standing rest break required with education on energy conservation strategies.   Stairs             Wheelchair Mobility     Tilt Bed    Modified Rankin (Stroke Patients Only)       Balance Overall balance assessment: Needs assistance Sitting-balance support: No upper extremity supported, Feet supported Sitting balance-Leahy Scale: Good     Standing balance support: Bilateral upper extremity supported, During functional activity Standing balance-Leahy Scale: Poor Standing balance comment: Dependent on RW for support.                            Communication Communication Communication: No apparent difficulties  Cognition Arousal: Alert Behavior During Therapy: WFL for tasks assessed/performed   PT - Cognitive impairments: No apparent impairments                         Following commands: Intact  Cueing Cueing Techniques: Verbal cues  Exercises      General Comments General comments (skin integrity, edema, etc.): no significant dyspnea with exertion      Pertinent Vitals/Pain Pain Assessment Pain Assessment: No/denies pain    Home Living                          Prior  Function            PT Goals (current goals can now be found in the care plan section) Acute Rehab PT Goals Patient Stated Goal: rehab PT Goal Formulation: With patient Time For Goal Achievement: 07/13/23 Potential to Achieve Goals: Good Progress towards PT goals: Progressing toward goals    Frequency    Min 2X/week      PT Plan      Co-evaluation              AM-PAC PT "6 Clicks" Mobility   Outcome Measure  Help needed turning from your back to your side while in a flat bed without using bedrails?: None Help needed moving from lying on your back to sitting on the side of a flat bed without using bedrails?: A Little Help needed moving to and from a bed to a chair (including a wheelchair)?: A Little Help needed standing up from a chair using your arms (e.g., wheelchair or bedside chair)?: A Little Help needed to walk in hospital room?: A Little Help needed climbing 3-5 steps with a railing? : A Lot 6 Click Score: 18    End of Session Equipment Utilized During Treatment: Oxygen Activity Tolerance: Patient tolerated treatment well Patient left: in bed;with call bell/phone within reach   PT Visit Diagnosis: Unsteadiness on feet (R26.81);Difficulty in walking, not elsewhere classified (R26.2)     Time: 1000-1019 PT Time Calculation (min) (ACUTE ONLY): 19 min  Charges:    $Therapeutic Activity: 8-22 mins PT General Charges $$ ACUTE PT VISIT: 1 Visit                     Donna Bernard, PT, MPT    Ina Homes 07/11/2023, 10:24 AM

## 2023-07-11 NOTE — Progress Notes (Signed)
 PROGRESS NOTE    [name]  WUX:[id number] DOB: [birth year] DOA: 07/04/2023 PCP: Danella Penton, MD    Brief Narrative:   78 year old with history of HTN, HLD, COPD, chronic hypoxia, AAA status post endovascular repair, CAD, chronic CHF with reduced EF 45% admitted for cough, shortness of breath.  Upon evaluation concerns of community-acquired pneumonia and COPD exacerbation.  Slowly improving on bronchodilators, steroids, Rocephin/azithromycin.  Assessment & Plan:   Severe sepsis and acute on chronic hypoxic respiratory failure secondary to community-acquired pneumonia and acute COPD exacerbation, POA: He appears to have left-sided community-acquired pneumonia, seen and cleared by speech, COVID/flu/RSV, RVP-negativ, Continue bronchodilators, I-S/flutter valve, counseled to quit smoking, finished empiric antibiotic treatment with Rocephin and azithromycin, cultures thus far negative continue to follow.  He is on 4 to 6 L of oxygen at home at baseline, encouraged him to sit in chair use I-S and flutter valve for pulmonary toiletry, advance activity and titrate down oxygen.  Sepsis pathophysiology has resolved.  SpO2: 92 % O2 Flow Rate (L/min): 5 L/min FiO2 (%): 40 %   Chronic HFmrEF  EF was 40-45% on echo from August 2024 .  Overall compensated in nature.  Currently home medications are on hold until sepsis physiology stabilizes.  For now on heart healthy diet will fluid restriction, dose of IV Lasix on 07/07/2023.  Cautiously resume home CHF medications.  Hypokalemia, hypophosphatemia - As needed repletion   Essential hypertension:  BP low with only low-dose diuretics, Entresto has to be held as blood pressure cannot tolerate it.   CAD/hyperlipidemia Denies chest pain.  On aspirin and statin   Hx of CVA  ASA, Lipitor     Ongoing smoking.  Counseled to quit.    Weakness and deconditioning.  PT OT, was discharged with home PT on 07/08/2023, however family not comfortable taking  him home, will request TOC to look at other options as well, PT to reevaluate again on 07/09/2023.    Acute thrombocytopenia:  likely in the setting of severe sepsis.  No bleeding.  Monitor closely.   GERD: PPI.  PT/OT-pending  DVT prophylaxis: Lovenox    Code Status: Full Code Family Communication:   Status is: Inpatient    Subjective: Patient in bed, appears comfortable, denies any headache, no fever, no chest pain or pressure, no shortness of breath , no abdominal pain. No focal weakness.  Vitals:   07/11/23 0440 07/11/23 0442 07/11/23 0500 07/11/23 0914  BP: 114/64   (!) 96/47  Pulse: 62 69  62  Temp:  98.3 F (36.8 C)  97.9 F (36.6 C)  Resp:    17  Height:      Weight:   76.5 kg   SpO2: 97% 96%  92%  TempSrc: Oral   Oral  BMI (Calculated):   22.87     Examination:  Awake Alert, No new F.N deficits, Normal affect [place].AT,PERRAL Supple Neck, No JVD,   Symmetrical Chest wall movement, moderate air movement bilaterally with coarse bilateral breath sounds RRR,No Gallops, Rubs or new Murmurs,  +ve B.Sounds, Abd Soft, No tenderness,   No Cyanosis, Clubbing or edema     Diet Orders (From admission, onward)     Start     Ordered   07/04/23 0536  Diet Heart Room service appropriate? Yes; Fluid consistency: Thin  Diet effective now       Question Answer Comment  Room service appropriate? Yes   Fluid consistency: Thin      07/04/23 0536  Data Review:   Inpatient Medications  Scheduled Meds:  arformoterol  15 mcg Nebulization BID   aspirin EC  81 mg Oral Daily   atorvastatin  80 mg Oral Daily   enoxaparin (LOVENOX) injection  40 mg Subcutaneous Q24H   furosemide  40 mg Oral Daily   midodrine  5 mg Oral TID WC   pantoprazole  40 mg Oral Daily   revefenacin  175 mcg Nebulization Daily   sodium chloride flush  3 mL Intravenous Q12H   spironolactone  12.5 mg Oral Daily   Continuous Infusions:   PRN Meds:.acetaminophen **OR** acetaminophen,  guaiFENesin, hydrALAZINE, ipratropium-albuterol, ondansetron **OR** ondansetron (ZOFRAN) IV, senna-docusate, traZODone  DVT Prophylaxis  enoxaparin (LOVENOX) injection 40 mg Start: 07/04/23 1000  Recent Labs  Lab 07/05/23 0501 07/06/23 0400 07/07/23 0538 07/08/23 0820 07/10/23 0459  WBC 18.2* 14.4* 9.6 7.2 8.8  HGB 11.7* 11.6* 11.3* 12.4* 12.8*  HCT 37.3* 35.6* 35.0* 38.1* 39.4  PLT 125* 145* 137* 145* 139*  MCV 96.9 96.5 97.0 94.3 95.2  MCH 30.4 31.4 31.3 30.7 30.9  MCHC 31.4 32.6 32.3 32.5 32.5  RDW 13.0 13.0 12.8 12.7 12.7  LYMPHSABS  --   --  0.8  --  1.6  MONOABS  --   --  0.4  --  0.6  EOSABS  --   --  0.0  --  0.3  BASOSABS  --   --  0.0  --  0.0    Recent Labs  Lab 07/04/23 1155 07/04/23 1800 07/05/23 0501 07/05/23 0501 07/06/23 0400 07/07/23 0538 07/08/23 0541 07/10/23 0459 07/11/23 0506  NA  --   --  139  --  141 140 141 143  --   K  --   --  3.6  --  3.4* 3.8 4.2 3.6  --   CL  --   --  105  --  104 105 106 103  --   CO2  --   --  29  --  29 29 25  34*  --   ANIONGAP  --   --  5  --  8 6 10 6   --   GLUCOSE  --   --  134*  --  105* 93 82 88  --   BUN  --   --  21  --  24* 24* 24* 22  --   CREATININE  --   --  1.17   < > 1.10 1.10 1.06 1.16 1.09  LATICACIDVEN 2.2* 1.1  --   --   --   --   --   --   --   BNP  --   --   --   --   --  1,463.7*  --   --   --   MG  --   --  1.7  --  1.9 1.9  --  1.7  --   PHOS  --   --   --   --   --  2.4*  --   --   --   CALCIUM  --   --  8.2*  --  8.3* 8.3* 8.0* 8.0*  --    < > = values in this interval not displayed.      Recent Labs  Lab 07/04/23 1155 07/04/23 1800 07/05/23 0501 07/06/23 0400 07/07/23 0538 07/08/23 0541 07/10/23 0459  LATICACIDVEN 2.2* 1.1  --   --   --   --   --   BNP  --   --   --   --  1,463.7*  --   --   MG  --   --  1.7 1.9 1.9  --  1.7  CALCIUM  --   --  8.2* 8.3* 8.3* 8.0* 8.0*     Micro Results Recent Results (from the past 240 hours)  Culture, blood (Routine x 2)     Status: None    Collection Time: 07/04/23  1:26 AM   Specimen: BLOOD RIGHT WRIST  Result Value Ref Range Status   Specimen Description BLOOD RIGHT WRIST  Final   Special Requests   Final    BOTTLES DRAWN AEROBIC AND ANAEROBIC Blood Culture results may not be optimal due to an inadequate volume of blood received in culture bottles   Culture   Final    NO GROWTH 5 DAYS Performed at [facility], [address], [place], [place] [address]    Report Status 07/09/2023 FINAL  Final  Culture, blood (Routine x 2)     Status: None   Collection Time: 07/04/23  1:32 AM   Specimen: BLOOD LEFT WRIST  Result Value Ref Range Status   Specimen Description BLOOD LEFT WRIST  Final   Special Requests   Final    BOTTLES DRAWN AEROBIC AND ANAEROBIC Blood Culture adequate volume   Culture   Final    NO GROWTH 5 DAYS Performed at [facility], [address] [place], [place] [address]    Report Status 07/09/2023 FINAL  Final  Resp panel by RT-PCR (RSV, Flu A&B, Covid) Anterior Nasal Swab     Status: None   Collection Time: 07/04/23  2:38 AM   Specimen: Anterior Nasal Swab  Result Value Ref Range Status   SARS Coronavirus 2 by RT PCR NEGATIVE NEGATIVE Final   Influenza A by PCR NEGATIVE NEGATIVE Final   Influenza B by PCR NEGATIVE NEGATIVE Final    Comment: (NOTE) The Xpert Xpress SARS-CoV-2/FLU/RSV plus assay is intended as an aid in the diagnosis of influenza from Nasopharyngeal swab specimens and should not be used as a sole basis for treatment. Nasal washings and aspirates are unacceptable for Xpert Xpress SARS-CoV-2/FLU/RSV testing.  Fact Sheet for Patients: BloggerCourse.com  Fact Sheet for Healthcare Providers: SeriousBroker.it  This test is not yet approved or cleared by the [place] FDA and has been authorized for detection and/or diagnosis of SARS-CoV-2 by FDA under an Emergency Use Authorization (EUA). This EUA will remain in  effect (meaning this test can be used) for the duration of the COVID-19 declaration under Section 564(b)(1) of the Act, 21 U.S.C. section 360bbb-3(b)(1), unless the authorization is terminated or revoked.     Resp Syncytial Virus by PCR NEGATIVE NEGATIVE Final    Comment: (NOTE) Fact Sheet for Patients: BloggerCourse.com  Fact Sheet for Healthcare Providers: SeriousBroker.it  This test is not yet approved or cleared by the [place] FDA and has been authorized for detection and/or diagnosis of SARS-CoV-2 by FDA under an Emergency Use Authorization (EUA). This EUA will remain in effect (meaning this test can be used) for the duration of the COVID-19 declaration under Section 564(b)(1) of the Act, 21 U.S.C. section 360bbb-3(b)(1), unless the authorization is terminated or revoked.  Performed at [facility], [address], [place], [place] [address]   Respiratory (~20 pathogens) panel by PCR     Status: None   Collection Time: 07/04/23  8:58 AM   Specimen: Nasopharyngeal Swab; Respiratory  Result Value Ref Range Status   Adenovirus NOT DETECTED NOT  DETECTED Final   Coronavirus 229E NOT DETECTED NOT DETECTED Final    Comment: (NOTE) The Coronavirus on the Respiratory Panel, DOES NOT test for the novel  Coronavirus (2019 nCoV)    Coronavirus HKU1 NOT DETECTED NOT DETECTED Final   Coronavirus NL63 NOT DETECTED NOT DETECTED Final   Coronavirus OC43 NOT DETECTED NOT DETECTED Final   Metapneumovirus NOT DETECTED NOT DETECTED Final   Rhinovirus / Enterovirus NOT DETECTED NOT DETECTED Final   Influenza A NOT DETECTED NOT DETECTED Final   Influenza B NOT DETECTED NOT DETECTED Final   Parainfluenza Virus 1 NOT DETECTED NOT DETECTED Final   Parainfluenza Virus 2 NOT DETECTED NOT DETECTED Final   Parainfluenza Virus 3 NOT DETECTED NOT DETECTED Final   Parainfluenza Virus 4 NOT DETECTED NOT DETECTED Final   Respiratory  Syncytial Virus NOT DETECTED NOT DETECTED Final   Bordetella pertussis NOT DETECTED NOT DETECTED Final   Bordetella Parapertussis NOT DETECTED NOT DETECTED Final   Chlamydophila pneumoniae NOT DETECTED NOT DETECTED Final   Mycoplasma pneumoniae NOT DETECTED NOT DETECTED Final    Comment: Performed at [facility], [address] [place], [place] [address]    Radiology Reports  No results found.     Signature  -   Susa Raring M.D on 07/11/2023 at 10:12 AM   -  To page go to [email or web address]

## 2023-07-11 NOTE — TOC Transition Note (Signed)
 Transition of Care [facility]) - Discharge Note   Patient Details  Name: [name] MRN: [id number] Date of Birth: 1945-07-04  [facility] CM/SW Contact:  Mearl Latin, LCSW Phone Number: 07/11/2023, 2:34 PM   Clinical Narrative:    Patient will DC to: [facility] Anticipated DC date: 07/11/23 Family notified: Victorino Dike, daughter in law (and son) Transport by: Son and Victorino Dike at 5:45pm via car   Per MD patient ready for DC to [facility]. RN to call report prior to discharge [phone] room 904B). RN, patient, patient's family, and facility notified of DC. Discharge Summary and FL2 sent to facility. DC packet on chart. Ambulance transport requested for patient.   CSW will sign off for now as social work intervention is no longer needed. Please consult [place] again if new needs arise.     Final next level of care: Skilled Nursing Facility Barriers to Discharge: Barriers Resolved   Patient Goals and CMS Choice Patient states their goals for this hospitalization and ongoing recovery are:: Rehab CMS Medicare.gov Compare Post Acute Care list provided to:: Patient Represenative (must comment) Choice offered to / list presented to :  (Daughter in Social worker) [place] ownership interest in [facility].provided to:: Patient    Discharge Placement   Existing PASRR number confirmed : 07/11/23          Patient chooses bed at: [facility] Patient to be transferred to facility by: [facility] Name of family member notified: Victorino Dike, DIL Patient and family notified of of transfer: 07/11/23  Discharge Plan and Services Additional resources added to the After Visit Summary for   In-house Referral: Clinical Social Work Discharge Planning Services: CM Consult Post Acute Care Choice: Skilled Nursing Facility                               Social Drivers of Health (SDOH) Interventions SDOH Screenings   Food Insecurity: No Food Insecurity (07/04/2023)  Housing:  Low Risk  (07/04/2023)  Transportation Needs: No Transportation Needs (07/04/2023)  Utilities: Not At Risk (07/04/2023)  Depression (PHQ2-9): Low Risk  (09/03/2018)  Financial Resource Strain: Low Risk  (01/03/2023)   Received from [facility]  Social Connections: Moderately Isolated (07/04/2023)  Stress: No Stress Concern Present (08/21/2018)  Tobacco Use: High Risk (07/04/2023)     Readmission Risk Interventions    04/28/2021    9:32 AM  Readmission Risk Prevention Plan  Transportation Screening Complete  PCP or Specialist Appt within 3-5 Days Complete  HRI or Home Care Consult Complete  Social Work Consult for Recovery Care Planning/Counseling Complete  Palliative Care Screening Not Applicable  Medication Review Oceanographer) Complete

## 2023-07-12 DIAGNOSIS — E876 Hypokalemia: Secondary | ICD-10-CM | POA: Diagnosis not present

## 2023-07-12 DIAGNOSIS — I2510 Atherosclerotic heart disease of native coronary artery without angina pectoris: Secondary | ICD-10-CM | POA: Diagnosis not present

## 2023-07-12 DIAGNOSIS — J9621 Acute and chronic respiratory failure with hypoxia: Secondary | ICD-10-CM | POA: Diagnosis not present

## 2023-07-12 DIAGNOSIS — A419 Sepsis, unspecified organism: Secondary | ICD-10-CM | POA: Diagnosis not present

## 2023-07-12 DIAGNOSIS — E785 Hyperlipidemia, unspecified: Secondary | ICD-10-CM | POA: Diagnosis not present

## 2023-07-12 DIAGNOSIS — J441 Chronic obstructive pulmonary disease with (acute) exacerbation: Secondary | ICD-10-CM | POA: Diagnosis not present

## 2023-07-12 DIAGNOSIS — I10 Essential (primary) hypertension: Secondary | ICD-10-CM | POA: Diagnosis not present

## 2023-07-12 DIAGNOSIS — I5022 Chronic systolic (congestive) heart failure: Secondary | ICD-10-CM | POA: Diagnosis not present

## 2023-07-12 DIAGNOSIS — J189 Pneumonia, unspecified organism: Secondary | ICD-10-CM | POA: Diagnosis not present

## 2023-07-12 DIAGNOSIS — Z8673 Personal history of transient ischemic attack (TIA), and cerebral infarction without residual deficits: Secondary | ICD-10-CM | POA: Diagnosis not present

## 2023-07-15 DIAGNOSIS — J9611 Chronic respiratory failure with hypoxia: Secondary | ICD-10-CM | POA: Diagnosis not present

## 2023-07-15 DIAGNOSIS — R1312 Dysphagia, oropharyngeal phase: Secondary | ICD-10-CM | POA: Diagnosis not present

## 2023-07-15 DIAGNOSIS — Z720 Tobacco use: Secondary | ICD-10-CM | POA: Diagnosis not present

## 2023-07-15 DIAGNOSIS — M6281 Muscle weakness (generalized): Secondary | ICD-10-CM | POA: Diagnosis not present

## 2023-07-15 DIAGNOSIS — J189 Pneumonia, unspecified organism: Secondary | ICD-10-CM | POA: Diagnosis not present

## 2023-07-15 DIAGNOSIS — Z9181 History of falling: Secondary | ICD-10-CM | POA: Diagnosis not present

## 2023-07-15 DIAGNOSIS — J449 Chronic obstructive pulmonary disease, unspecified: Secondary | ICD-10-CM | POA: Diagnosis not present

## 2023-07-15 DIAGNOSIS — R2689 Other abnormalities of gait and mobility: Secondary | ICD-10-CM | POA: Diagnosis not present

## 2023-07-15 DIAGNOSIS — I5042 Chronic combined systolic (congestive) and diastolic (congestive) heart failure: Secondary | ICD-10-CM | POA: Diagnosis not present

## 2023-07-15 DIAGNOSIS — S72002D Fracture of unspecified part of neck of left femur, subsequent encounter for closed fracture with routine healing: Secondary | ICD-10-CM | POA: Diagnosis not present

## 2023-07-15 DIAGNOSIS — R278 Other lack of coordination: Secondary | ICD-10-CM | POA: Diagnosis not present

## 2023-07-15 DIAGNOSIS — Z8673 Personal history of transient ischemic attack (TIA), and cerebral infarction without residual deficits: Secondary | ICD-10-CM | POA: Diagnosis not present

## 2023-07-16 DIAGNOSIS — J189 Pneumonia, unspecified organism: Secondary | ICD-10-CM | POA: Diagnosis not present

## 2023-07-16 DIAGNOSIS — I10 Essential (primary) hypertension: Secondary | ICD-10-CM | POA: Diagnosis not present

## 2023-07-16 DIAGNOSIS — J441 Chronic obstructive pulmonary disease with (acute) exacerbation: Secondary | ICD-10-CM | POA: Diagnosis not present

## 2023-07-16 DIAGNOSIS — I2510 Atherosclerotic heart disease of native coronary artery without angina pectoris: Secondary | ICD-10-CM | POA: Diagnosis not present

## 2023-07-16 DIAGNOSIS — E876 Hypokalemia: Secondary | ICD-10-CM | POA: Diagnosis not present

## 2023-07-16 DIAGNOSIS — A419 Sepsis, unspecified organism: Secondary | ICD-10-CM | POA: Diagnosis not present

## 2023-07-16 DIAGNOSIS — I5022 Chronic systolic (congestive) heart failure: Secondary | ICD-10-CM | POA: Diagnosis not present

## 2023-07-16 DIAGNOSIS — J9621 Acute and chronic respiratory failure with hypoxia: Secondary | ICD-10-CM | POA: Diagnosis not present

## 2023-07-16 DIAGNOSIS — E785 Hyperlipidemia, unspecified: Secondary | ICD-10-CM | POA: Diagnosis not present

## 2023-07-16 DIAGNOSIS — Z8673 Personal history of transient ischemic attack (TIA), and cerebral infarction without residual deficits: Secondary | ICD-10-CM | POA: Diagnosis not present

## 2023-07-17 DIAGNOSIS — I10 Essential (primary) hypertension: Secondary | ICD-10-CM | POA: Diagnosis not present

## 2023-07-17 DIAGNOSIS — R6520 Severe sepsis without septic shock: Secondary | ICD-10-CM | POA: Diagnosis not present

## 2023-07-17 DIAGNOSIS — J441 Chronic obstructive pulmonary disease with (acute) exacerbation: Secondary | ICD-10-CM | POA: Diagnosis not present

## 2023-07-17 DIAGNOSIS — J9621 Acute and chronic respiratory failure with hypoxia: Secondary | ICD-10-CM | POA: Diagnosis not present

## 2023-07-17 DIAGNOSIS — I2510 Atherosclerotic heart disease of native coronary artery without angina pectoris: Secondary | ICD-10-CM | POA: Diagnosis not present

## 2023-07-17 DIAGNOSIS — E876 Hypokalemia: Secondary | ICD-10-CM | POA: Diagnosis not present

## 2023-07-17 DIAGNOSIS — E785 Hyperlipidemia, unspecified: Secondary | ICD-10-CM | POA: Diagnosis not present

## 2023-07-17 DIAGNOSIS — Z8673 Personal history of transient ischemic attack (TIA), and cerebral infarction without residual deficits: Secondary | ICD-10-CM | POA: Diagnosis not present

## 2023-07-17 DIAGNOSIS — J189 Pneumonia, unspecified organism: Secondary | ICD-10-CM | POA: Diagnosis not present

## 2023-07-17 DIAGNOSIS — I5022 Chronic systolic (congestive) heart failure: Secondary | ICD-10-CM | POA: Diagnosis not present

## 2023-07-18 DIAGNOSIS — I5022 Chronic systolic (congestive) heart failure: Secondary | ICD-10-CM | POA: Diagnosis not present

## 2023-07-18 DIAGNOSIS — R6520 Severe sepsis without septic shock: Secondary | ICD-10-CM | POA: Diagnosis not present

## 2023-07-18 DIAGNOSIS — E876 Hypokalemia: Secondary | ICD-10-CM | POA: Diagnosis not present

## 2023-07-18 DIAGNOSIS — J189 Pneumonia, unspecified organism: Secondary | ICD-10-CM | POA: Diagnosis not present

## 2023-07-18 DIAGNOSIS — Z8673 Personal history of transient ischemic attack (TIA), and cerebral infarction without residual deficits: Secondary | ICD-10-CM | POA: Diagnosis not present

## 2023-07-18 DIAGNOSIS — J441 Chronic obstructive pulmonary disease with (acute) exacerbation: Secondary | ICD-10-CM | POA: Diagnosis not present

## 2023-07-18 DIAGNOSIS — I2510 Atherosclerotic heart disease of native coronary artery without angina pectoris: Secondary | ICD-10-CM | POA: Diagnosis not present

## 2023-07-18 DIAGNOSIS — I10 Essential (primary) hypertension: Secondary | ICD-10-CM | POA: Diagnosis not present

## 2023-07-18 DIAGNOSIS — J9621 Acute and chronic respiratory failure with hypoxia: Secondary | ICD-10-CM | POA: Diagnosis not present

## 2023-07-18 DIAGNOSIS — E785 Hyperlipidemia, unspecified: Secondary | ICD-10-CM | POA: Diagnosis not present

## 2023-07-19 ENCOUNTER — Emergency Department (HOSPITAL_COMMUNITY)
Admission: EM | Admit: 2023-07-19 | Discharge: 2023-07-19 | Disposition: A | Discharge status: Skilled Nursing Facility | Attending: Emergency Medicine | Admitting: Emergency Medicine

## 2023-07-19 ENCOUNTER — Emergency Department (HOSPITAL_COMMUNITY)

## 2023-07-19 DIAGNOSIS — M47811 Spondylosis without myelopathy or radiculopathy, occipito-atlanto-axial region: Secondary | ICD-10-CM | POA: Diagnosis not present

## 2023-07-19 DIAGNOSIS — S0081XA Abrasion of other part of head, initial encounter: Secondary | ICD-10-CM | POA: Insufficient documentation

## 2023-07-19 DIAGNOSIS — W06XXXA Fall from bed, initial encounter: Secondary | ICD-10-CM | POA: Insufficient documentation

## 2023-07-19 DIAGNOSIS — M5020 Other cervical disc displacement, unspecified cervical region: Secondary | ICD-10-CM | POA: Diagnosis not present

## 2023-07-19 DIAGNOSIS — S0990XA Unspecified injury of head, initial encounter: Secondary | ICD-10-CM | POA: Diagnosis not present

## 2023-07-19 DIAGNOSIS — I10 Essential (primary) hypertension: Secondary | ICD-10-CM | POA: Diagnosis not present

## 2023-07-19 DIAGNOSIS — Z8546 Personal history of malignant neoplasm of prostate: Secondary | ICD-10-CM | POA: Insufficient documentation

## 2023-07-19 DIAGNOSIS — I2510 Atherosclerotic heart disease of native coronary artery without angina pectoris: Secondary | ICD-10-CM | POA: Diagnosis not present

## 2023-07-19 DIAGNOSIS — G9389 Other specified disorders of brain: Secondary | ICD-10-CM | POA: Diagnosis not present

## 2023-07-19 DIAGNOSIS — I6782 Cerebral ischemia: Secondary | ICD-10-CM | POA: Diagnosis not present

## 2023-07-19 DIAGNOSIS — J449 Chronic obstructive pulmonary disease, unspecified: Secondary | ICD-10-CM | POA: Insufficient documentation

## 2023-07-19 DIAGNOSIS — W19XXXA Unspecified fall, initial encounter: Secondary | ICD-10-CM

## 2023-07-19 NOTE — ED Notes (Signed)
 [facility] [employer]

## 2023-07-19 NOTE — ED Provider Notes (Signed)
 Emergency Department Provider Note   I have reviewed the triage vital signs and the nursing notes.   HISTORY  Chief Complaint Fall   HPI [name] is a 78 y.o. male with past history reviewed below presents to the emergency department from [facility] [place] [facility] after a fall from bed this morning.  Patient states he was reaching for the call button when he lost his balance, falling to the ground and striking his head.  He is not anticoagulated.  Denies any pain including headache.  He is on his baseline oxygen and feeling well.  No chest or abdominal pain.  No back pain.  Denies arm or leg discomfort.  Past Medical History:  Diagnosis Date   AAA (abdominal aortic aneurysm) (HCC)    a.  CTA 7/19: measured 4.5 x 5.3 cm and greatest transverse dimensions   Abnormal nuclear stress test    a.  Myoview 2012: anterior wall ischemia with an estimated EF of 26%.  This was a new wall motion abnormality as well as a newly reduced EF   Brain bleed (HCC)    COPD (chronic obstructive pulmonary disease) (HCC)    Coronary artery disease    a.  Posterior MI in 2002 status post BMS to the LCx; b. LHC 2012: 95% stenosis of the proximal LAD, 95% stenosis of the mid LAD, 30% in-stent restenosis of the mid left circumflex with a second lesion of diffuse 50% stenosis.  The patient underwent successful PCI/BMS to the mid LAD with 0% residual stenosis, LV gram not performed   GERD (gastroesophageal reflux disease)    History of kidney stones    History of prostate cancer    Hyperlipidemia    Hypertension    Myocardial infarction [facility])    Neuromuscular disorder (HCC)    Prostate cancer (HCC)    a.  Status post seeding   PVD (peripheral vascular disease) (HCC)    Stroke (HCC)     Review of Systems  Constitutional: No fever/chills Cardiovascular: Denies chest pain. Respiratory: Denies shortness of breath. Gastrointestinal: No abdominal pain.  No nausea, no vomiting. Musculoskeletal: Negative for back  pain. Skin: Negative for rash. Neurological: Negative for headaches  ____________________________________________   PHYSICAL EXAM:  VITAL SIGNS: ED Triage Vitals  Encounter Vitals Group     BP 07/19/23 0701 (!) 147/87     Pulse Rate 07/19/23 0701 71     Resp 07/19/23 0701 15     Temp 07/19/23 0701 98.1 F (36.7 C)     Temp Source 07/19/23 0701 Oral     SpO2 07/19/23 0701 100 %   Constitutional: Alert and oriented. Well appearing and in no acute distress. Eyes: Conjunctivae are normal.  Head: Abrasion to the forehead with Steri-Strips in place. Nose: No congestion/rhinnorhea. Mouth/Throat: Mucous membranes are moist.  Neck: No stridor. No cervical spine tenderness to palpation. Cardiovascular: Normal rate, regular rhythm. Good peripheral circulation. Grossly normal heart sounds.  2+ DP pulses bilateral feet. Respiratory: Normal respiratory effort.  No retractions. Lungs CTAB. Gastrointestinal: Soft and nontender. No distention.  Musculoskeletal: No lower extremity tenderness. No gross deformities of extremities.  Normal range of motion of the bilateral upper extremities and lower extremities.  Neurologic:  Normal speech and language. No gross focal neurologic deficits are appreciated.  Skin:  Skin is warm, dry and intact. No rash noted.  ____________________________________________  RADIOLOGY  CT Head Wo Contrast Result Date: 07/19/2023 CLINICAL DATA:  Provided history: Head trauma, minor.  Neck trauma. EXAM: CT  HEAD WITHOUT CONTRAST CT CERVICAL SPINE WITHOUT CONTRAST TECHNIQUE: Multidetector CT imaging of the head and cervical spine was performed following the standard protocol without intravenous contrast. Multiplanar CT image reconstructions of the cervical spine were also generated. RADIATION DOSE REDUCTION: This exam was performed according to the departmental dose-optimization program which includes automated exposure control, adjustment of the mA and/or kV according to  patient size and/or use of iterative reconstruction technique. COMPARISON:  Head CT 03/22/2021.  Cervical spine CT 02/21/2021. FINDINGS: CT HEAD FINDINGS Brain: Generalized cerebral atrophy. Moderate-sized chronic infarct within the right parietal and occipital lobes (PCA vascular territory), new from the prior head CT of 03/22/2021. This infarct is at least partly chronic. However, portions of the infarct within the right occipital lobe may be subacute or chronic. Small chronic infarct within the posterior right frontal lobe (affecting the precentral gyrus), new from the prior CT). Sizable focus of chronic encephalomalacia/gliosis within the right temporal lobe (at site of prior parenchymal hemorrhage). Small focus of post-traumatic encephalomalacia/gliosis within the anteroinferior right frontal lobe. Chronic infarcts again demonstrated within/about the bilateral deep gray nuclei. Mild patchy and ill-defined hypoattenuation elsewhere with the cerebral white matter, nonspecific but compatible with chronic small vessel ischemic disease. Small chronic infarct within the left cerebellar hemisphere, unchanged. There is no acute intracranial hemorrhage. No acute demarcated cortical infarct. No extra-axial fluid collection. No evidence of an intracranial mass. No midline shift. Vascular: No hyperdense vessel.  Atherosclerotic calcifications. Skull: No calvarial fracture or aggressive osseous lesion. Sinuses/Orbits: No mass or acute finding within the imaged orbits. Mild mucosal thickening within the left maxillary and bilateral ethmoid sinuses. Other: Small-volume fluid within the mastoid air cells. CT CERVICAL SPINE FINDINGS Alignment: Dextrocurvature of the cervical spine. Mild grade 1 anterolisthesis at C5-C6 and C6-C7. Skull base and vertebrae: The basion-dental and atlanto-dental intervals are maintained.No evidence of acute fracture to the cervical spine. Soft tissues and spinal canal: No prevertebral fluid or  swelling. No visible canal hematoma. Disc levels: Cervical spondylosis with multilevel disc bulges/disc protrusions, uncovertebral hypertrophy and facet arthropathy. No appreciable high-grade spinal canal stenosis. Multilevel bony neural foraminal narrowing. Degenerative changes also present at the C1-C2 articulation. Upper chest: The right lung apex is excluded from the field of view. No consolidation within the imaged left lung apex. No visible pneumothorax. IMPRESSION: CT head: 1. No acute intracranial hemorrhage. 2. Moderate-sized chronic infarct within the right parietal and occipital lobes (PCA vascular territory), new from the prior head CT of 03/22/2021. This infarct is at least partly chronic. However, portions of the infarct within the right occipital lobe may be subacute or chronic. A brain MRI may be obtained for further evaluation, as clinically warranted. 3. Sizable focus of chronic encephalomalacia/gliosis within the right temporal lobe (at site of prior parenchymal hemorrhage). 4. Small focus of post-traumatic encephalomalacia/gliosis within the anteroinferior right frontal lobe. 5. Background parenchymal atrophy, chronic small vessel ischemic disease and chronic infarcts, as described. 6. Mild paranasal sinus mucosal thickening. 7. Small-volume fluid within the mastoid air cells. CT cervical spine: 1. No evidence of an acute cervical spine fracture. 2. Dextrocurvature of the cervical spine. 3. Mild grade 1 anterolisthesis at C5-C6 and C6-C7, unchanged from the prior cervical spine CT of 02/21/2021. 4. Cervical spondylosis as described. Electronically Signed   By: Jackey Loge D.O.   On: 07/19/2023 08:58   CT Cervical Spine Wo Contrast Result Date: 07/19/2023 CLINICAL DATA:  Provided history: Head trauma, minor.  Neck trauma. EXAM: CT HEAD WITHOUT CONTRAST CT CERVICAL SPINE  WITHOUT CONTRAST TECHNIQUE: Multidetector CT imaging of the head and cervical spine was performed following the standard  protocol without intravenous contrast. Multiplanar CT image reconstructions of the cervical spine were also generated. RADIATION DOSE REDUCTION: This exam was performed according to the departmental dose-optimization program which includes automated exposure control, adjustment of the mA and/or kV according to patient size and/or use of iterative reconstruction technique. COMPARISON:  Head CT 03/22/2021.  Cervical spine CT 02/21/2021. FINDINGS: CT HEAD FINDINGS Brain: Generalized cerebral atrophy. Moderate-sized chronic infarct within the right parietal and occipital lobes (PCA vascular territory), new from the prior head CT of 03/22/2021. This infarct is at least partly chronic. However, portions of the infarct within the right occipital lobe may be subacute or chronic. Small chronic infarct within the posterior right frontal lobe (affecting the precentral gyrus), new from the prior CT). Sizable focus of chronic encephalomalacia/gliosis within the right temporal lobe (at site of prior parenchymal hemorrhage). Small focus of post-traumatic encephalomalacia/gliosis within the anteroinferior right frontal lobe. Chronic infarcts again demonstrated within/about the bilateral deep gray nuclei. Mild patchy and ill-defined hypoattenuation elsewhere with the cerebral white matter, nonspecific but compatible with chronic small vessel ischemic disease. Small chronic infarct within the left cerebellar hemisphere, unchanged. There is no acute intracranial hemorrhage. No acute demarcated cortical infarct. No extra-axial fluid collection. No evidence of an intracranial mass. No midline shift. Vascular: No hyperdense vessel.  Atherosclerotic calcifications. Skull: No calvarial fracture or aggressive osseous lesion. Sinuses/Orbits: No mass or acute finding within the imaged orbits. Mild mucosal thickening within the left maxillary and bilateral ethmoid sinuses. Other: Small-volume fluid within the mastoid air cells. CT CERVICAL  SPINE FINDINGS Alignment: Dextrocurvature of the cervical spine. Mild grade 1 anterolisthesis at C5-C6 and C6-C7. Skull base and vertebrae: The basion-dental and atlanto-dental intervals are maintained.No evidence of acute fracture to the cervical spine. Soft tissues and spinal canal: No prevertebral fluid or swelling. No visible canal hematoma. Disc levels: Cervical spondylosis with multilevel disc bulges/disc protrusions, uncovertebral hypertrophy and facet arthropathy. No appreciable high-grade spinal canal stenosis. Multilevel bony neural foraminal narrowing. Degenerative changes also present at the C1-C2 articulation. Upper chest: The right lung apex is excluded from the field of view. No consolidation within the imaged left lung apex. No visible pneumothorax. IMPRESSION: CT head: 1. No acute intracranial hemorrhage. 2. Moderate-sized chronic infarct within the right parietal and occipital lobes (PCA vascular territory), new from the prior head CT of 03/22/2021. This infarct is at least partly chronic. However, portions of the infarct within the right occipital lobe may be subacute or chronic. A brain MRI may be obtained for further evaluation, as clinically warranted. 3. Sizable focus of chronic encephalomalacia/gliosis within the right temporal lobe (at site of prior parenchymal hemorrhage). 4. Small focus of post-traumatic encephalomalacia/gliosis within the anteroinferior right frontal lobe. 5. Background parenchymal atrophy, chronic small vessel ischemic disease and chronic infarcts, as described. 6. Mild paranasal sinus mucosal thickening. 7. Small-volume fluid within the mastoid air cells. CT cervical spine: 1. No evidence of an acute cervical spine fracture. 2. Dextrocurvature of the cervical spine. 3. Mild grade 1 anterolisthesis at C5-C6 and C6-C7, unchanged from the prior cervical spine CT of 02/21/2021. 4. Cervical spondylosis as described. Electronically Signed   By: Jackey Loge D.O.   On:  07/19/2023 08:58    ____________________________________________   PROCEDURES  Procedure(s) performed:   Procedures  None  ____________________________________________   INITIAL IMPRESSION / ASSESSMENT AND PLAN / ED COURSE  Pertinent labs & imaging results  that were available during my care of the patient were reviewed by me and considered in my medical decision making (see chart for details).   This patient is Presenting for Evaluation of head injury, which does require a range of treatment options, and is a complaint that involves a high risk of morbidity and mortality.  The Differential Diagnoses includes subdural hematoma, epidural hematoma, acute concussion, traumatic subarachnoid hemorrhage, cerebral contusions, etc.  Radiologic Tests Ordered, included CT head and c spine. I independently interpreted the images and agree with radiology interpretation.   Medical Decision Making: Summary:  Patient presents to the emergency department for evaluation after head injury.  Minimal scalp abrasion noted without bleeding.  No large hematoma.  No altered mental status.  For CT imaging and reassess.  Reevaluation with update and discussion with the patient.  His rolled out of bed this morning was mechanical.  He is not having any new weakness or numbness.  No other symptoms concerning for acute stroke to correlate with head CT.  Considered emergent MRI but with reassuring exam, mechanical fall out of bed, baseline neuro/mental status do not feel this is indicated.  Patient's presentation is most consistent with acute presentation with potential threat to life or bodily function.   Disposition: discharge  ____________________________________________  FINAL CLINICAL IMPRESSION(S) / ED DIAGNOSES  Final diagnoses:  Fall, initial encounter  Injury of head, initial encounter    Note:  This document was prepared using Dragon voice recognition software and may include unintentional  dictation errors.  Alona Bene, MD, [facility]    Tempie Gibeault, Arlyss Repress, MD 07/19/23 1010

## 2023-07-19 NOTE — Discharge Instructions (Signed)
 You were seen in the emerged from today after rolling out of bed and sustaining a mild head injury.  Your CT scan did not show any bleeding or fracture.  We see your area of old stroke.  If you develop any new stroke symptoms you should return to the emergency department immediately for reevaluation.

## 2023-07-19 NOTE — ED Triage Notes (Signed)
 Patient comes in from [facility] after falling out of bed. Per EMS, patient hit his head and is not on any thinners. Patient denies any pain. Patient is on 4 L of O2 [place] (baseline). Patient is AO X 4.

## 2023-07-19 NOTE — ED Notes (Signed)
 This RN spoke to [employer] about father update and going back to [facility] and [address].

## 2023-07-20 DIAGNOSIS — S0081XA Abrasion of other part of head, initial encounter: Secondary | ICD-10-CM | POA: Diagnosis not present

## 2023-07-20 DIAGNOSIS — W06XXXA Fall from bed, initial encounter: Secondary | ICD-10-CM | POA: Diagnosis not present

## 2023-07-20 DIAGNOSIS — Z87820 Personal history of traumatic brain injury: Secondary | ICD-10-CM | POA: Diagnosis not present

## 2023-07-22 DIAGNOSIS — J449 Chronic obstructive pulmonary disease, unspecified: Secondary | ICD-10-CM | POA: Diagnosis not present

## 2023-07-22 DIAGNOSIS — I5040 Unspecified combined systolic (congestive) and diastolic (congestive) heart failure: Secondary | ICD-10-CM | POA: Diagnosis not present

## 2023-07-23 DIAGNOSIS — J441 Chronic obstructive pulmonary disease with (acute) exacerbation: Secondary | ICD-10-CM | POA: Diagnosis not present

## 2023-07-23 DIAGNOSIS — R6520 Severe sepsis without septic shock: Secondary | ICD-10-CM | POA: Diagnosis not present

## 2023-07-23 DIAGNOSIS — I10 Essential (primary) hypertension: Secondary | ICD-10-CM | POA: Diagnosis not present

## 2023-07-23 DIAGNOSIS — E785 Hyperlipidemia, unspecified: Secondary | ICD-10-CM | POA: Diagnosis not present

## 2023-07-23 DIAGNOSIS — I5022 Chronic systolic (congestive) heart failure: Secondary | ICD-10-CM | POA: Diagnosis not present

## 2023-07-23 DIAGNOSIS — J189 Pneumonia, unspecified organism: Secondary | ICD-10-CM | POA: Diagnosis not present

## 2023-07-23 DIAGNOSIS — I2510 Atherosclerotic heart disease of native coronary artery without angina pectoris: Secondary | ICD-10-CM | POA: Diagnosis not present

## 2023-07-23 DIAGNOSIS — A419 Sepsis, unspecified organism: Secondary | ICD-10-CM | POA: Diagnosis not present

## 2023-07-23 DIAGNOSIS — J9621 Acute and chronic respiratory failure with hypoxia: Secondary | ICD-10-CM | POA: Diagnosis not present

## 2023-07-23 DIAGNOSIS — E876 Hypokalemia: Secondary | ICD-10-CM | POA: Diagnosis not present

## 2023-07-28 ENCOUNTER — Other Ambulatory Visit: Payer: Self-pay

## 2023-07-28 ENCOUNTER — Encounter (HOSPITAL_COMMUNITY)
Admission: EM | Disposition: A | Discharge status: Home / Self Care | Payer: Self-pay | Source: Home / Self Care | Attending: Family Medicine

## 2023-07-28 ENCOUNTER — Inpatient Hospital Stay (HOSPITAL_COMMUNITY): Admitting: Anesthesiology

## 2023-07-28 ENCOUNTER — Emergency Department (HOSPITAL_COMMUNITY)

## 2023-07-28 ENCOUNTER — Inpatient Hospital Stay (HOSPITAL_COMMUNITY)

## 2023-07-28 ENCOUNTER — Inpatient Hospital Stay (HOSPITAL_COMMUNITY)
Admission: EM | Admit: 2023-07-28 | Discharge: 2023-08-01 | DRG: 522 | Disposition: A | Discharge status: Home / Self Care | Attending: Family Medicine | Admitting: Family Medicine

## 2023-07-28 ENCOUNTER — Encounter (HOSPITAL_COMMUNITY): Payer: Self-pay | Admitting: Internal Medicine

## 2023-07-28 DIAGNOSIS — I5042 Chronic combined systolic (congestive) and diastolic (congestive) heart failure: Secondary | ICD-10-CM | POA: Diagnosis not present

## 2023-07-28 DIAGNOSIS — I5022 Chronic systolic (congestive) heart failure: Secondary | ICD-10-CM | POA: Diagnosis not present

## 2023-07-28 DIAGNOSIS — S72002A Fracture of unspecified part of neck of left femur, initial encounter for closed fracture: Secondary | ICD-10-CM | POA: Diagnosis not present

## 2023-07-28 DIAGNOSIS — W06XXXA Fall from bed, initial encounter: Secondary | ICD-10-CM | POA: Diagnosis present

## 2023-07-28 DIAGNOSIS — Y92009 Unspecified place in unspecified non-institutional (private) residence as the place of occurrence of the external cause: Secondary | ICD-10-CM

## 2023-07-28 DIAGNOSIS — E785 Hyperlipidemia, unspecified: Secondary | ICD-10-CM | POA: Diagnosis present

## 2023-07-28 DIAGNOSIS — K219 Gastro-esophageal reflux disease without esophagitis: Secondary | ICD-10-CM | POA: Diagnosis present

## 2023-07-28 DIAGNOSIS — S72002D Fracture of unspecified part of neck of left femur, subsequent encounter for closed fracture with routine healing: Secondary | ICD-10-CM | POA: Diagnosis not present

## 2023-07-28 DIAGNOSIS — Z8673 Personal history of transient ischemic attack (TIA), and cerebral infarction without residual deficits: Secondary | ICD-10-CM | POA: Diagnosis not present

## 2023-07-28 DIAGNOSIS — Z9889 Other specified postprocedural states: Secondary | ICD-10-CM | POA: Diagnosis not present

## 2023-07-28 DIAGNOSIS — J439 Emphysema, unspecified: Secondary | ICD-10-CM | POA: Diagnosis not present

## 2023-07-28 DIAGNOSIS — E1151 Type 2 diabetes mellitus with diabetic peripheral angiopathy without gangrene: Secondary | ICD-10-CM | POA: Diagnosis present

## 2023-07-28 DIAGNOSIS — J9611 Chronic respiratory failure with hypoxia: Secondary | ICD-10-CM | POA: Diagnosis present

## 2023-07-28 DIAGNOSIS — F17210 Nicotine dependence, cigarettes, uncomplicated: Secondary | ICD-10-CM | POA: Diagnosis present

## 2023-07-28 DIAGNOSIS — I959 Hypotension, unspecified: Secondary | ICD-10-CM | POA: Diagnosis present

## 2023-07-28 DIAGNOSIS — W19XXXA Unspecified fall, initial encounter: Secondary | ICD-10-CM

## 2023-07-28 DIAGNOSIS — G9389 Other specified disorders of brain: Secondary | ICD-10-CM | POA: Diagnosis not present

## 2023-07-28 DIAGNOSIS — J410 Simple chronic bronchitis: Secondary | ICD-10-CM | POA: Diagnosis not present

## 2023-07-28 DIAGNOSIS — Z7982 Long term (current) use of aspirin: Secondary | ICD-10-CM

## 2023-07-28 DIAGNOSIS — Z95828 Presence of other vascular implants and grafts: Secondary | ICD-10-CM | POA: Diagnosis not present

## 2023-07-28 DIAGNOSIS — Z888 Allergy status to other drugs, medicaments and biological substances status: Secondary | ICD-10-CM

## 2023-07-28 DIAGNOSIS — Z9981 Dependence on supplemental oxygen: Secondary | ICD-10-CM

## 2023-07-28 DIAGNOSIS — I252 Old myocardial infarction: Secondary | ICD-10-CM | POA: Diagnosis not present

## 2023-07-28 DIAGNOSIS — Z23 Encounter for immunization: Secondary | ICD-10-CM | POA: Diagnosis not present

## 2023-07-28 DIAGNOSIS — I2510 Atherosclerotic heart disease of native coronary artery without angina pectoris: Secondary | ICD-10-CM | POA: Diagnosis not present

## 2023-07-28 DIAGNOSIS — I509 Heart failure, unspecified: Secondary | ICD-10-CM | POA: Diagnosis not present

## 2023-07-28 DIAGNOSIS — R531 Weakness: Secondary | ICD-10-CM | POA: Diagnosis not present

## 2023-07-28 DIAGNOSIS — I5043 Acute on chronic combined systolic (congestive) and diastolic (congestive) heart failure: Secondary | ICD-10-CM | POA: Diagnosis not present

## 2023-07-28 DIAGNOSIS — Z8546 Personal history of malignant neoplasm of prostate: Secondary | ICD-10-CM | POA: Diagnosis not present

## 2023-07-28 DIAGNOSIS — Z043 Encounter for examination and observation following other accident: Secondary | ICD-10-CM | POA: Diagnosis not present

## 2023-07-28 DIAGNOSIS — M7989 Other specified soft tissue disorders: Secondary | ICD-10-CM | POA: Diagnosis not present

## 2023-07-28 DIAGNOSIS — S0990XA Unspecified injury of head, initial encounter: Secondary | ICD-10-CM | POA: Diagnosis not present

## 2023-07-28 DIAGNOSIS — Z96642 Presence of left artificial hip joint: Secondary | ICD-10-CM | POA: Diagnosis not present

## 2023-07-28 DIAGNOSIS — Z743 Need for continuous supervision: Secondary | ICD-10-CM | POA: Diagnosis not present

## 2023-07-28 DIAGNOSIS — R001 Bradycardia, unspecified: Secondary | ICD-10-CM | POA: Diagnosis not present

## 2023-07-28 DIAGNOSIS — Y92002 Bathroom of unspecified non-institutional (private) residence single-family (private) house as the place of occurrence of the external cause: Secondary | ICD-10-CM

## 2023-07-28 DIAGNOSIS — I130 Hypertensive heart and chronic kidney disease with heart failure and stage 1 through stage 4 chronic kidney disease, or unspecified chronic kidney disease: Secondary | ICD-10-CM | POA: Diagnosis not present

## 2023-07-28 DIAGNOSIS — Z801 Family history of malignant neoplasm of trachea, bronchus and lung: Secondary | ICD-10-CM

## 2023-07-28 DIAGNOSIS — Z955 Presence of coronary angioplasty implant and graft: Secondary | ICD-10-CM

## 2023-07-28 DIAGNOSIS — S8990XA Unspecified injury of unspecified lower leg, initial encounter: Secondary | ICD-10-CM | POA: Diagnosis not present

## 2023-07-28 DIAGNOSIS — J432 Centrilobular emphysema: Secondary | ICD-10-CM | POA: Diagnosis not present

## 2023-07-28 DIAGNOSIS — Z7185 Encounter for immunization safety counseling: Secondary | ICD-10-CM | POA: Diagnosis not present

## 2023-07-28 DIAGNOSIS — N179 Acute kidney failure, unspecified: Secondary | ICD-10-CM | POA: Diagnosis not present

## 2023-07-28 DIAGNOSIS — I110 Hypertensive heart disease with heart failure: Secondary | ICD-10-CM | POA: Diagnosis not present

## 2023-07-28 DIAGNOSIS — Z79899 Other long term (current) drug therapy: Secondary | ICD-10-CM | POA: Diagnosis not present

## 2023-07-28 DIAGNOSIS — R278 Other lack of coordination: Secondary | ICD-10-CM | POA: Diagnosis not present

## 2023-07-28 DIAGNOSIS — I5023 Acute on chronic systolic (congestive) heart failure: Secondary | ICD-10-CM | POA: Diagnosis present

## 2023-07-28 DIAGNOSIS — I10 Essential (primary) hypertension: Secondary | ICD-10-CM | POA: Diagnosis not present

## 2023-07-28 DIAGNOSIS — D62 Acute posthemorrhagic anemia: Secondary | ICD-10-CM | POA: Diagnosis not present

## 2023-07-28 DIAGNOSIS — E7849 Other hyperlipidemia: Secondary | ICD-10-CM

## 2023-07-28 DIAGNOSIS — M6281 Muscle weakness (generalized): Secondary | ICD-10-CM | POA: Diagnosis not present

## 2023-07-28 DIAGNOSIS — R2689 Other abnormalities of gait and mobility: Secondary | ICD-10-CM | POA: Diagnosis not present

## 2023-07-28 DIAGNOSIS — Z8679 Personal history of other diseases of the circulatory system: Secondary | ICD-10-CM

## 2023-07-28 DIAGNOSIS — Z833 Family history of diabetes mellitus: Secondary | ICD-10-CM

## 2023-07-28 DIAGNOSIS — J449 Chronic obstructive pulmonary disease, unspecified: Secondary | ICD-10-CM | POA: Diagnosis not present

## 2023-07-28 DIAGNOSIS — I517 Cardiomegaly: Secondary | ICD-10-CM | POA: Diagnosis not present

## 2023-07-28 DIAGNOSIS — Z8249 Family history of ischemic heart disease and other diseases of the circulatory system: Secondary | ICD-10-CM | POA: Diagnosis not present

## 2023-07-28 DIAGNOSIS — S199XXA Unspecified injury of neck, initial encounter: Secondary | ICD-10-CM | POA: Diagnosis not present

## 2023-07-28 DIAGNOSIS — I639 Cerebral infarction, unspecified: Secondary | ICD-10-CM | POA: Diagnosis not present

## 2023-07-28 DIAGNOSIS — N182 Chronic kidney disease, stage 2 (mild): Secondary | ICD-10-CM | POA: Diagnosis present

## 2023-07-28 DIAGNOSIS — J189 Pneumonia, unspecified organism: Secondary | ICD-10-CM | POA: Diagnosis not present

## 2023-07-28 HISTORY — PX: HIP ARTHROPLASTY: SHX981

## 2023-07-28 LAB — CBC WITH DIFFERENTIAL/PLATELET
Abs Immature Granulocytes: 0.09 10*3/uL — ABNORMAL HIGH (ref 0.00–0.07)
Basophils Absolute: 0 10*3/uL (ref 0.0–0.1)
Basophils Relative: 0 %
Eosinophils Absolute: 0.1 10*3/uL (ref 0.0–0.5)
Eosinophils Relative: 1 %
HCT: 40 % (ref 39.0–52.0)
Hemoglobin: 12.7 g/dL — ABNORMAL LOW (ref 13.0–17.0)
Immature Granulocytes: 1 %
Lymphocytes Relative: 5 %
Lymphs Abs: 0.8 10*3/uL (ref 0.7–4.0)
MCH: 30.9 pg (ref 26.0–34.0)
MCHC: 31.8 g/dL (ref 30.0–36.0)
MCV: 97.3 fL (ref 80.0–100.0)
Monocytes Absolute: 0.6 10*3/uL (ref 0.1–1.0)
Monocytes Relative: 4 %
Neutro Abs: 13.7 10*3/uL — ABNORMAL HIGH (ref 1.7–7.7)
Neutrophils Relative %: 89 %
Platelets: 179 10*3/uL (ref 150–400)
RBC: 4.11 MIL/uL — ABNORMAL LOW (ref 4.22–5.81)
RDW: 13 % (ref 11.5–15.5)
WBC: 15.4 10*3/uL — ABNORMAL HIGH (ref 4.0–10.5)
nRBC: 0 % (ref 0.0–0.2)

## 2023-07-28 LAB — BASIC METABOLIC PANEL WITH GFR
Anion gap: 8 (ref 5–15)
BUN: 15 mg/dL (ref 8–23)
CO2: 28 mmol/L (ref 22–32)
Calcium: 8.2 mg/dL — ABNORMAL LOW (ref 8.9–10.3)
Chloride: 105 mmol/L (ref 98–111)
Creatinine, Ser: 1.45 mg/dL — ABNORMAL HIGH (ref 0.61–1.24)
GFR, Estimated: 49 mL/min — ABNORMAL LOW (ref >60)
Glucose, Bld: 133 mg/dL — ABNORMAL HIGH (ref 70–99)
Potassium: 3.5 mmol/L (ref 3.5–5.1)
Sodium: 141 mmol/L (ref 135–145)

## 2023-07-28 LAB — CBC
HCT: 37 % — ABNORMAL LOW (ref 39.0–52.0)
Hemoglobin: 11.9 g/dL — ABNORMAL LOW (ref 13.0–17.0)
MCH: 31.3 pg (ref 26.0–34.0)
MCHC: 32.2 g/dL (ref 30.0–36.0)
MCV: 97.4 fL (ref 80.0–100.0)
Platelets: 167 10*3/uL (ref 150–400)
RBC: 3.8 MIL/uL — ABNORMAL LOW (ref 4.22–5.81)
RDW: 13.1 % (ref 11.5–15.5)
WBC: 13.1 10*3/uL — ABNORMAL HIGH (ref 4.0–10.5)
nRBC: 0 % (ref 0.0–0.2)

## 2023-07-28 LAB — TYPE AND SCREEN
ABO/RH(D): O POS
Antibody Screen: NEGATIVE

## 2023-07-28 LAB — GLUCOSE, CAPILLARY: Glucose-Capillary: 112 mg/dL — ABNORMAL HIGH (ref 70–99)

## 2023-07-28 LAB — SURGICAL PCR SCREEN
MRSA, PCR: NEGATIVE
Staphylococcus aureus: NEGATIVE

## 2023-07-28 SURGERY — HEMIARTHROPLASTY (BIPOLAR) HIP, POSTERIOR APPROACH FOR FRACTURE
Anesthesia: Monitor Anesthesia Care | Laterality: Left

## 2023-07-28 MED ORDER — LIDOCAINE 2% (20 MG/ML) 5 ML SYRINGE
INTRAMUSCULAR | Status: AC
  Filled 2023-07-28: qty 5

## 2023-07-28 MED ORDER — LACTATED RINGERS IV SOLN: INTRAVENOUS | Status: DC

## 2023-07-28 MED ORDER — SODIUM CHLORIDE 0.9% FLUSH
3.0000 mL | Freq: Two times a day (BID) | INTRAVENOUS | Status: DC
  Administered 2023-07-28 – 2023-07-29 (×3): 3 mL via INTRAVENOUS

## 2023-07-28 MED ORDER — DEXAMETHASONE SODIUM PHOSPHATE 10 MG/ML IJ SOLN
INTRAMUSCULAR | Status: AC
  Filled 2023-07-28: qty 1

## 2023-07-28 MED ORDER — HEPARIN SODIUM (PORCINE) 5000 UNIT/ML IJ SOLN
5000.0000 [IU] | Freq: Three times a day (TID) | INTRAMUSCULAR | Status: DC
  Administered 2023-07-28 – 2023-08-01 (×12): 5000 [IU] via SUBCUTANEOUS
  Filled 2023-07-28 (×12): qty 1

## 2023-07-28 MED ORDER — PHENYLEPHRINE HCL (PRESSORS) 10 MG/ML IV SOLN
INTRAVENOUS | Status: DC | PRN
  Administered 2023-07-28 (×2): 80 ug via INTRAVENOUS

## 2023-07-28 MED ORDER — MORPHINE SULFATE (PF) 4 MG/ML IV SOLN
4.0000 mg | Freq: Once | INTRAVENOUS | Status: AC
  Administered 2023-07-28: 4 mg via INTRAVENOUS
  Filled 2023-07-28: qty 1

## 2023-07-28 MED ORDER — ONDANSETRON HCL 4 MG/2ML IJ SOLN
INTRAMUSCULAR | Status: DC | PRN
  Administered 2023-07-28: 4 mg via INTRAVENOUS

## 2023-07-28 MED ORDER — FENTANYL CITRATE (PF) 100 MCG/2ML IJ SOLN
25.0000 ug | INTRAMUSCULAR | Status: DC | PRN
Start: 2023-07-28 — End: 2023-07-28

## 2023-07-28 MED ORDER — SPIRONOLACTONE 12.5 MG HALF TABLET
12.5000 mg | ORAL_TABLET | Freq: Every day | ORAL | Status: DC
  Administered 2023-07-28 – 2023-07-29 (×2): 12.5 mg via ORAL
  Filled 2023-07-28 (×3): qty 1

## 2023-07-28 MED ORDER — ASPIRIN 81 MG PO TBEC
81.0000 mg | DELAYED_RELEASE_TABLET | Freq: Every day | ORAL | Status: DC
  Administered 2023-07-29: 81 mg via ORAL
  Filled 2023-07-28: qty 1

## 2023-07-28 MED ORDER — TRANEXAMIC ACID-NACL 1000-0.7 MG/100ML-% IV SOLN
INTRAVENOUS | Status: AC
  Filled 2023-07-28: qty 100

## 2023-07-28 MED ORDER — UMECLIDINIUM BROMIDE 62.5 MCG/ACT IN AEPB
1.0000 | INHALATION_SPRAY | Freq: Every day | RESPIRATORY_TRACT | Status: DC
  Administered 2023-07-28 – 2023-08-01 (×5): 1 via RESPIRATORY_TRACT
  Filled 2023-07-28: qty 7

## 2023-07-28 MED ORDER — SODIUM CHLORIDE 0.9 % IV SOLN: 250.0000 mL | INTRAVENOUS | Status: DC | PRN

## 2023-07-28 MED ORDER — STERILE WATER FOR IRRIGATION IR SOLN
Status: DC | PRN
  Administered 2023-07-28: 1000 mL

## 2023-07-28 MED ORDER — CHLORHEXIDINE GLUCONATE 0.12 % MT SOLN: 15.0000 mL | Freq: Once | OROMUCOSAL | Status: AC

## 2023-07-28 MED ORDER — CEFAZOLIN SODIUM-DEXTROSE 2-4 GM/100ML-% IV SOLN
2.0000 g | INTRAVENOUS | Status: AC
  Administered 2023-07-28: 2 g via INTRAVENOUS

## 2023-07-28 MED ORDER — ROCURONIUM BROMIDE 10 MG/ML (PF) SYRINGE
PREFILLED_SYRINGE | INTRAVENOUS | Status: AC
  Filled 2023-07-28: qty 10

## 2023-07-28 MED ORDER — VANCOMYCIN HCL 1000 MG IV SOLR
INTRAVENOUS | Status: DC | PRN
  Administered 2023-07-28: 1000 mg via TOPICAL

## 2023-07-28 MED ORDER — ACETAMINOPHEN 325 MG PO TABS
650.0000 mg | ORAL_TABLET | Freq: Four times a day (QID) | ORAL | Status: DC | PRN
  Administered 2023-07-28 – 2023-07-29 (×2): 650 mg via ORAL
  Filled 2023-07-28 (×3): qty 2

## 2023-07-28 MED ORDER — HYDROMORPHONE HCL 1 MG/ML IJ SOLN: 0.5000 mg | INTRAMUSCULAR | Status: DC | PRN

## 2023-07-28 MED ORDER — ACETAMINOPHEN 10 MG/ML IV SOLN: 1000.0000 mg | Freq: Once | INTRAVENOUS | Status: DC | PRN

## 2023-07-28 MED ORDER — ATORVASTATIN CALCIUM 80 MG PO TABS
80.0000 mg | ORAL_TABLET | Freq: Every day | ORAL | Status: DC
  Administered 2023-07-28 – 2023-08-01 (×5): 80 mg via ORAL
  Filled 2023-07-28 (×5): qty 1

## 2023-07-28 MED ORDER — PROPOFOL 10 MG/ML IV BOLUS
INTRAVENOUS | Status: AC
  Filled 2023-07-28: qty 20

## 2023-07-28 MED ORDER — TRANEXAMIC ACID-NACL 1000-0.7 MG/100ML-% IV SOLN
1000.0000 mg | INTRAVENOUS | Status: AC
  Administered 2023-07-28: 1000 mg via INTRAVENOUS

## 2023-07-28 MED ORDER — CEFAZOLIN SODIUM-DEXTROSE 2-4 GM/100ML-% IV SOLN
INTRAVENOUS | Status: AC
  Filled 2023-07-28: qty 100

## 2023-07-28 MED ORDER — MIDAZOLAM HCL 2 MG/2ML IJ SOLN
INTRAMUSCULAR | Status: AC
  Filled 2023-07-28: qty 2

## 2023-07-28 MED ORDER — BUPIVACAINE IN DEXTROSE 0.75-8.25 % IT SOLN
INTRATHECAL | Status: DC | PRN
  Administered 2023-07-28: 2 mL via INTRATHECAL

## 2023-07-28 MED ORDER — PROPOFOL 500 MG/50ML IV EMUL
INTRAVENOUS | Status: DC | PRN
  Administered 2023-07-28: 30 ug/kg/min via INTRAVENOUS

## 2023-07-28 MED ORDER — ASPIRIN 81 MG PO TBEC: 81.0000 mg | DELAYED_RELEASE_TABLET | Freq: Every day | ORAL | Status: DC

## 2023-07-28 MED ORDER — FENTANYL CITRATE (PF) 250 MCG/5ML IJ SOLN
INTRAMUSCULAR | Status: AC
  Filled 2023-07-28: qty 5

## 2023-07-28 MED ORDER — OXYCODONE HCL 5 MG PO TABS
5.0000 mg | ORAL_TABLET | ORAL | Status: DC | PRN
  Administered 2023-07-28 – 2023-07-29 (×3): 5 mg via ORAL
  Filled 2023-07-28 (×4): qty 1

## 2023-07-28 MED ORDER — SODIUM CHLORIDE 0.9% FLUSH
3.0000 mL | Freq: Two times a day (BID) | INTRAVENOUS | Status: DC
  Administered 2023-07-28 – 2023-08-01 (×8): 3 mL via INTRAVENOUS

## 2023-07-28 MED ORDER — 0.9 % SODIUM CHLORIDE (POUR BTL) OPTIME
TOPICAL | Status: DC | PRN
  Administered 2023-07-28: 1000 mL

## 2023-07-28 MED ORDER — FENTANYL CITRATE (PF) 250 MCG/5ML IJ SOLN
INTRAMUSCULAR | Status: DC | PRN
Start: 2023-07-28 — End: 2023-07-28
  Administered 2023-07-28 (×2): 25 ug via INTRAVENOUS

## 2023-07-28 MED ORDER — OXYCODONE HCL 5 MG PO TABS
5.0000 mg | ORAL_TABLET | Freq: Four times a day (QID) | ORAL | Status: DC | PRN
Start: 2023-07-28 — End: 2023-07-28

## 2023-07-28 MED ORDER — VANCOMYCIN HCL 1000 MG IV SOLR
INTRAVENOUS | Status: AC
  Filled 2023-07-28: qty 20

## 2023-07-28 MED ORDER — PHENYLEPHRINE 80 MCG/ML (10ML) SYRINGE FOR IV PUSH (FOR BLOOD PRESSURE SUPPORT)
PREFILLED_SYRINGE | INTRAVENOUS | Status: DC | PRN
  Administered 2023-07-28: 80 ug via INTRAVENOUS

## 2023-07-28 MED ORDER — SODIUM CHLORIDE 0.9 % IR SOLN
Status: DC | PRN
  Administered 2023-07-28: 1000 mL

## 2023-07-28 MED ORDER — IPRATROPIUM-ALBUTEROL 0.5-2.5 (3) MG/3ML IN SOLN
3.0000 mL | RESPIRATORY_TRACT | Status: DC | PRN
  Administered 2023-07-29: 3 mL via RESPIRATORY_TRACT
  Filled 2023-07-28: qty 3

## 2023-07-28 MED ORDER — HYDROMORPHONE HCL 1 MG/ML IJ SOLN
0.5000 mg | INTRAMUSCULAR | Status: DC | PRN
  Administered 2023-07-28 – 2023-07-30 (×5): 1 mg via INTRAVENOUS
  Filled 2023-07-28 (×5): qty 1

## 2023-07-28 MED ORDER — PHENYLEPHRINE 80 MCG/ML (10ML) SYRINGE FOR IV PUSH (FOR BLOOD PRESSURE SUPPORT)
PREFILLED_SYRINGE | INTRAVENOUS | Status: AC
  Filled 2023-07-28: qty 10

## 2023-07-28 MED ORDER — ORAL CARE MOUTH RINSE: 15.0000 mL | Freq: Once | OROMUCOSAL | Status: AC

## 2023-07-28 MED ORDER — DEXAMETHASONE SODIUM PHOSPHATE 10 MG/ML IJ SOLN
INTRAMUSCULAR | Status: DC | PRN
  Administered 2023-07-28: 10 mg via INTRAVENOUS

## 2023-07-28 MED ORDER — SODIUM CHLORIDE 0.9% FLUSH: 3.0000 mL | INTRAVENOUS | Status: DC | PRN

## 2023-07-28 MED ORDER — PHENYLEPHRINE HCL-NACL 20-0.9 MG/250ML-% IV SOLN
INTRAVENOUS | Status: DC | PRN
  Administered 2023-07-28: 50 ug/min via INTRAVENOUS

## 2023-07-28 MED ORDER — CHLORHEXIDINE GLUCONATE 0.12 % MT SOLN
OROMUCOSAL | Status: AC
Start: 2023-07-28 — End: 2023-07-28
  Administered 2023-07-28: 15 mL via OROMUCOSAL
  Filled 2023-07-28: qty 15

## 2023-07-28 MED ORDER — FUROSEMIDE 20 MG PO TABS: 20.0000 mg | ORAL_TABLET | Freq: Every day | ORAL | Status: DC

## 2023-07-28 MED ORDER — ONDANSETRON HCL 4 MG/2ML IJ SOLN
INTRAMUSCULAR | Status: AC
  Filled 2023-07-28: qty 2

## 2023-07-28 MED ORDER — CEFAZOLIN SODIUM-DEXTROSE 2-4 GM/100ML-% IV SOLN
2.0000 g | Freq: Three times a day (TID) | INTRAVENOUS | Status: AC
  Administered 2023-07-28 – 2023-07-29 (×2): 2 g via INTRAVENOUS
  Filled 2023-07-28 (×2): qty 100

## 2023-07-28 MED ORDER — MIDAZOLAM HCL 2 MG/2ML IJ SOLN
INTRAMUSCULAR | Status: DC | PRN
  Administered 2023-07-28 (×2): 1 mg via INTRAVENOUS

## 2023-07-28 SURGICAL SUPPLY — 63 items
BLADE SAGITTAL 25.0X1.27X90 (BLADE) ×1 IMPLANT
BLADE SAW SGTL 73X25 THK (BLADE) IMPLANT
BRUSH FEMORAL CANAL (MISCELLANEOUS) IMPLANT
CEMENT BONE R 1X40 (Cement) IMPLANT
CENTRALIZER STEM DISTAL 10 (Knees) IMPLANT
CHLORAPREP W/TINT 26 (MISCELLANEOUS) ×2 IMPLANT
CLSR STERI-STRIP ANTIMIC 1/2X4 (GAUZE/BANDAGES/DRESSINGS) IMPLANT
COVER SURGICAL LIGHT HANDLE (MISCELLANEOUS) ×1 IMPLANT
DERMABOND ADVANCED .7 DNX12 (GAUZE/BANDAGES/DRESSINGS) ×1 IMPLANT
DRAPE HALF SHEET 40X57 (DRAPES) ×1 IMPLANT
DRAPE HIP W/POCKET STRL (MISCELLANEOUS) ×1 IMPLANT
DRAPE INCISE IOBAN 66X45 STRL (DRAPES) ×1 IMPLANT
DRAPE INCISE IOBAN 85X60 (DRAPES) ×1 IMPLANT
DRAPE POUCH INSTRU U-SHP 10X18 (DRAPES) ×1 IMPLANT
DRAPE U-SHAPE 47X51 STRL (DRAPES) ×2 IMPLANT
DRSG AQUACEL AG ADV 3.5X10 (GAUZE/BANDAGES/DRESSINGS) ×1 IMPLANT
ELECT BLADE 4.0 EZ CLEAN MEGAD (MISCELLANEOUS) ×1 IMPLANT
ELECT REM PT RETURN 15FT ADLT (MISCELLANEOUS) ×1 IMPLANT
ELECTRODE BLDE 4.0 EZ CLN MEGD (MISCELLANEOUS) ×1 IMPLANT
FACESHIELD WRAPAROUND (MASK) ×1 IMPLANT
FACESHIELD WRAPAROUND OR TEAM (MASK) IMPLANT
GLOVE BIO SURGEON STRL SZ 6.5 (GLOVE) ×1 IMPLANT
GLOVE BIOGEL PI IND STRL 6.5 (GLOVE) ×1 IMPLANT
GLOVE SRG 8 PF TXTR STRL LF DI (GLOVE) ×1 IMPLANT
GLOVE SURG ORTHO LTX SZ8 (GLOVE) ×2 IMPLANT
GOWN STRL REUS W/ TWL LRG LVL3 (GOWN DISPOSABLE) ×1 IMPLANT
GOWN STRL REUS W/ TWL XL LVL3 (GOWN DISPOSABLE) ×2 IMPLANT
HOOD PEEL AWAY T7 (MISCELLANEOUS) ×3 IMPLANT
KIT BASIN OR (CUSTOM PROCEDURE TRAY) ×1 IMPLANT
KIT PREP HIP W/CEMENT RESTRICT (Miscellaneous) IMPLANT
KIT TURNOVER KIT B (KITS) ×1 IMPLANT
LINER BIPOLAR 28MM +4 (Hips) IMPLANT
LINER BIPOLAR SHELL 28ID 51OD (Liner) IMPLANT
MANIFOLD NEPTUNE II (INSTRUMENTS) ×1 IMPLANT
MARKER SKIN DUAL TIP RULER LAB (MISCELLANEOUS) ×1 IMPLANT
NDL 18GX1X1/2 (RX/OR ONLY) (NEEDLE) ×2 IMPLANT
NEEDLE 18GX1X1/2 (RX/OR ONLY) (NEEDLE) ×2 IMPLANT
NS IRRIG 1000ML POUR BTL (IV SOLUTION) ×1 IMPLANT
PACK TOTAL JOINT (CUSTOM PROCEDURE TRAY) ×1 IMPLANT
PRESSURIZER FEMORAL UNIV (MISCELLANEOUS) ×1 IMPLANT
RETRIEVER SUT HEWSON (MISCELLANEOUS) ×1 IMPLANT
SEALER BIPOLAR AQUA 6.0 (INSTRUMENTS) IMPLANT
SET HNDPC FAN SPRY TIP SCT (DISPOSABLE) IMPLANT
SET INTERPULSE LAVAGE W/TIP (ORTHOPEDIC DISPOSABLE SUPPLIES) IMPLANT
SPONGE T-LAP 18X18 ~~LOC~~+RFID (SPONGE) IMPLANT
STEM FEMORAL SZ12 125 131D (Stem) IMPLANT
SUCTION TUBE FRAZIER 10FR DISP (SUCTIONS) ×1 IMPLANT
SUT BONE WAX W31G (SUTURE) ×1 IMPLANT
SUT ETHIBOND 2 V 37 (SUTURE) ×1 IMPLANT
SUT MNCRL AB 3-0 PS2 27 (SUTURE) ×1 IMPLANT
SUT STRATAFIX 0 PDS 27 VIOLET (SUTURE) ×1 IMPLANT
SUT STRATAFIX 1PDS 45CM VIOLET (SUTURE) ×2 IMPLANT
SUT VIC AB 0 CT1 27XBRD ANBCTR (SUTURE) ×1 IMPLANT
SUT VIC AB 2-0 CT2 27 (SUTURE) ×2 IMPLANT
SUT VIC AB 3-0 X1 27 (SUTURE) IMPLANT
SUTURE STRATFX 0 PDS 27 VIOLET (SUTURE) IMPLANT
SYR 20ML LL LF (SYRINGE) ×1 IMPLANT
SYR 50ML LL SCALE MARK (SYRINGE) ×1 IMPLANT
TOWEL GREEN STERILE (TOWEL DISPOSABLE) ×1 IMPLANT
TOWER CARTRIDGE SMART MIX (DISPOSABLE) IMPLANT
TRAY FOLEY MTR SLVR 16FR STAT (SET/KITS/TRAYS/PACK) IMPLANT
TUBE SUCT ARGYLE STRL (TUBING) ×1 IMPLANT
WATER STERILE IRR 1000ML POUR (IV SOLUTION) ×1 IMPLANT

## 2023-07-28 NOTE — Anesthesia Procedure Notes (Signed)
 Spinal  Patient location during procedure: OR Start time: 07/28/2023 2:08 PM End time: 07/28/2023 2:10 PM Staffing Performed: anesthesiologist  Anesthesiologist: Micheal Agent, DO Performed by: Micheal Agent, DO Authorized by: Micheal Agent, DO   Preanesthetic Checklist Completed: patient identified, IV checked, site marked, risks and benefits discussed, surgical consent, monitors and equipment checked, pre-op evaluation and timeout performed Spinal Block Patient position: sitting Prep: DuraPrep Patient monitoring: heart rate, cardiac monitor, continuous pulse ox and blood pressure Approach: midline Location: L3-4 Injection technique: single-shot Needle Needle type: Pencan  Needle gauge: 24 G Needle length: 10 cm Assessment Events: CSF return Additional Notes Patient identified. Risks/Benefits/Options discussed with patient including but not limited to bleeding, infection, nerve damage, paralysis, failed block, incomplete pain control, headache, blood pressure changes, nausea, vomiting, reactions to medications, itching and postpartum back pain. Confirmed with bedside nurse the patient's most recent platelet count. Confirmed with patient that they are not currently taking any anticoagulation, have any bleeding history or any family history of bleeding disorders. Patient expressed understanding and wished to proceed. All questions were answered. Sterile technique was used throughout the entire procedure. Please see nursing notes for vital signs. Warning signs of high block given to the patient including shortness of breath, tingling/numbness in hands, complete motor block, or any concerning symptoms with instructions to call for help. Patient was given instructions on fall risk and not to get out of bed. All questions and concerns addressed with instructions to call with any issues or inadequate analgesia.

## 2023-07-28 NOTE — H&P (Signed)
 History and Physical    RENALD HAITHCOCK ZOX:[id number] DOB: 1945-05-23 DOA: 07/28/2023  PCP: Sari Cunning, MD   Patient coming from: Home   Chief Complaint:  Chief Complaint  Patient presents with   Fall   ED TRIAGE note:  BIB EMS from home after falling while trying to ambulate to the bathroom with his walker. Pt states he fell on his left hip, endorsing L hip and pelvic pain. Dp's bilaterally. On 4L O2 at home since recent d/c from [facility]. 200mcg fentanyl given by EMS en route.       HPI:  DYLLIN GULLEY is a 78 y.o. male with medical history significant of chronic hypoxic respiratory failure 4 to 6 L oxygen at home at baseline, CHF with reduced EF 40 to 45%, essential hypertension, CAD, hyperlipidemia, AAA status post endovascular repair, and COPD presented emergency department for evaluation for mechanical fall.  Patient reported that he accidentally fell on the bathroom landed on the left-sided hip and complaining about left-sided hip pain and pelvic pain. Patient reported left-sided pleuritic rib joint pain 8 out of 10.  Patient denies any head injury, loss of consciousness, chest pain, shortness of breath and palpitation. Stating that he has been taking all blood pressure medication as well as midodrine.  ED Course:  At presentation to ED patient is hemodynamically stable. BMP showing elevated creatinine 1.45, low GFR 49 otherwise unremarkable. CBC showing leukocytosis 15.4, stable H&H 12.7 and 40.  Normal platelet count. CT cervical spine no evidence of fracture and dislocation. CT head no acute intracranial abnormality.  X-ray of the hip joint and pelvis showed left femoral neck fracture with varus angulation and impaction.  X-ray showed mild cardiomegaly active disease process.  In the ED patient has been given morphine 4 mg.  ED physician consulted to spoke with orthopedic surgeon Dr. Marilee Showers recommended to keep patient n.p.o. and plan is to take patient  to OR today.  Hospitalist has been consulted for further evaluation management of left-sided femoral neck fracture.   Significant labs in the ED: Lab Orders         CBC with Differential         Basic metabolic panel         CBC         Comprehensive metabolic panel         Basic metabolic panel         Urinalysis, Routine w reflex microscopic -Urine, Clean Catch         CBG monitoring, ED       Review of Systems:  Review of Systems  Constitutional:  Negative for chills, fever, malaise/fatigue and weight loss.  Eyes:  Negative for blurred vision.  Respiratory:  Negative for cough and shortness of breath.   Cardiovascular:  Negative for chest pain, palpitations and leg swelling.  Gastrointestinal:  Negative for heartburn, nausea and vomiting.  Musculoskeletal:  Positive for falls and joint pain. Negative for back pain, myalgias and neck pain.  Neurological:  Negative for dizziness and headaches.  Psychiatric/Behavioral:  The patient is not nervous/anxious.     Past Medical History:  Diagnosis Date   AAA (abdominal aortic aneurysm) (HCC)    a.  CTA 7/19: measured 4.5 x 5.3 cm and greatest transverse dimensions   Abnormal nuclear stress test    a.  Myoview 2012: anterior wall ischemia with an estimated EF of 26%.  This was a new wall motion abnormality as well as a  newly reduced EF   Brain bleed (HCC)    COPD (chronic obstructive pulmonary disease) (HCC)    Coronary artery disease    a.  Posterior MI in 2002 status post BMS to the LCx; b. LHC 2012: 95% stenosis of the proximal LAD, 95% stenosis of the mid LAD, 30% in-stent restenosis of the mid left circumflex with a second lesion of diffuse 50% stenosis.  The patient underwent successful PCI/BMS to the mid LAD with 0% residual stenosis, LV gram not performed   GERD (gastroesophageal reflux disease)    History of kidney stones    History of prostate cancer    Hyperlipidemia    Hypertension    Myocardial infarction [facility])     Neuromuscular disorder (HCC)    Prostate cancer (HCC)    a.  Status post seeding   PVD (peripheral vascular disease) (HCC)    Stroke [facility])     Past Surgical History:  Procedure Laterality Date   BACK SURGERY  2009/2010   x 2   BOWEL RESECTION N/A 03/18/2021   Procedure: SMALL BOWEL RESECTION;  Surgeon: Anda Bamberg, MD;  Location: [facility] OR;  Service: General;  Laterality: N/A;   CARDIAC CATHETERIZATION  2002   stent placement    ENDOVASCULAR REPAIR/STENT GRAFT N/A 01/02/2018   Procedure: ENDOVASCULAR REPAIR/STENT GRAFT;  Surgeon: Celso College, MD;  Location: [facility] INVASIVE CV LAB;  Service: Cardiovascular;  Laterality: N/A;   FOOT SURGERY     bilateral    HERNIA REPAIR     bilateral    INGUINAL HERNIA REPAIR Left 03/18/2021   Procedure: INGUINAL HERNIA REPAIR;  Surgeon: Anda Bamberg, MD;  Location: [facility] OR;  Service: General;  Laterality: Left;   INSERTION OF MESH Left 03/18/2021   Procedure: INSERTION OF MESH;  Surgeon: Anda Bamberg, MD;  Location: [facility] OR;  Service: General;  Laterality: Left;   KNEE SURGERY     right knee    PROSTATE SURGERY  09/2009   prostate implant   SPINE SURGERY     UMBILICAL HERNIA REPAIR N/A 03/18/2021   Procedure: UMBILICAL HERNIA REPAIR;  Surgeon: Anda Bamberg, MD;  Location: [facility] OR;  Service: General;  Laterality: N/A;     reports that he has been smoking cigarettes. He has a 14 pack-year smoking history. He has never used smokeless tobacco. He reports that he does not drink alcohol and does not use drugs.  Allergies  Allergen Reactions   Lyrica [Pregabalin] Swelling         Family History  Problem Relation Age of Onset   Heart attack Mother    Diabetes Mother    Heart disease Father    Lung cancer Daughter     Prior to Admission medications   Medication Sig Start Date End Date Taking? Authorizing Provider  acetaminophen (TYLENOL) 325 MG tablet Take 2 tablets (650 mg total) by mouth every 6 (six) hours as needed for mild  pain (or Fever >/= 101). 04/28/21  Yes Elgergawy, Ardia Kraft, MD  albuterol (PROVENTIL) (2.5 MG/3ML) 0.083% nebulizer solution Take 2.5 mg by nebulization every 6 (six) hours as needed. 10/09/22  Yes [provider]  albuterol (VENTOLIN HFA) 108 (90 Base) MCG/ACT inhaler Inhale 2 puffs into the lungs every 6 (six) hours as needed for wheezing. 04/07/21 11/30/24 Yes Medina-Vargas, Monina C, NP  aspirin EC 81 MG tablet Take 81 mg by mouth daily.   Yes [provider]  atorvastatin (LIPITOR) 80 MG tablet Take 1  tablet (80 mg total) by mouth daily. 06/28/23  Yes Shawnee Dellen A, FNP  dapagliflozin propanediol (FARXIGA) 10 MG TABS tablet Take 1 tablet (10 mg total) by mouth daily. 06/28/23  Yes Shawnee Dellen A, FNP  furosemide (LASIX) 20 MG tablet Take 1 tablet (20 mg total) by mouth daily. Take 40 mg (2 tablets) on Monday and Friday only. Take Lasix 20 mg (1 tablet) on the other days. 06/28/23  Yes Hackney, Brian Campanile A, FNP  ipratropium-albuterol (DUONEB) 0.5-2.5 (3) MG/3ML SOLN Use twice a day scheduled and every 4 hours as needed for shortness of breath and wheezing 07/07/23  Yes Singh, Prashant K, MD  midodrine (PROAMATINE) 5 MG tablet Take 1 tablet (5 mg total) by mouth 2 (two) times daily with a meal. 07/11/23  Yes Singh, Prashant K, MD  Multiple Vitamins-Minerals (MULTIVITAMIN WITH MINERALS) tablet Take 1 tablet by mouth daily.   Yes [provider]  omeprazole (PRILOSEC) 40 MG capsule TAKE 1 CAPSULE BY MOUTH DAILY USUALLY BEFORE BREAKFAST 04/07/21  Yes Medina-Vargas, Monina C, NP  spironolactone (ALDACTONE) 25 MG tablet Take 0.5 tablets (12.5 mg total) by mouth daily. FOR CHRONIC COMBINED CHF 06/28/23  Yes Shawnee Dellen A, FNP  tiotropium (SPIRIVA HANDIHALER) 18 MCG inhalation capsule Place 1 capsule (18 mcg total) into inhaler and inhale daily. 07/07/23 07/06/24 Yes Singh, Prashant K, MD  methylPREDNISolone (MEDROL DOSEPAK) 4 MG TBPK tablet follow package directions 07/07/23    Cala Castleman, MD     Physical Exam: Vitals:   07/28/23 0222 07/28/23 0530  BP: 113/64 (!) 140/60  Pulse: 74 75  Resp: 16 13  Temp: 97.8 F (36.6 C)   TempSrc: Oral   SpO2: 95% 92%    Physical Exam Vitals and nursing note reviewed.  Constitutional:      Appearance: He is ill-appearing.  HENT:     Mouth/Throat:     Mouth: Mucous membranes are moist.  Eyes:     Pupils: Pupils are equal, round, and reactive to light.  Cardiovascular:     Rate and Rhythm: Normal rate.     Heart sounds: No murmur heard. Pulmonary:     Effort: Pulmonary effort is normal.     Breath sounds: No wheezing or rhonchi.  Musculoskeletal:        General: Tenderness present.     Cervical back: Neck supple.     Right lower leg: No edema.     Left lower leg: No edema.  Skin:    Capillary Refill: Capillary refill takes less than 2 seconds.  Neurological:     Mental Status: He is alert and oriented to person, place, and time.  Psychiatric:        Mood and Affect: Mood normal.        Thought Content: Thought content normal.        Judgment: Judgment normal.      Labs on Admission: I have personally reviewed following labs and imaging studies  CBC: Recent Labs  Lab 07/28/23 0406  WBC 15.4*  NEUTROABS 13.7*  HGB 12.7*  HCT 40.0  MCV 97.3  PLT 179   Basic Metabolic Panel: Recent Labs  Lab 07/28/23 0406  NA 141  K 3.5  CL 105  CO2 28  GLUCOSE 133*  BUN 15  CREATININE 1.45*  CALCIUM 8.2*   GFR: CrCl cannot be calculated (Unknown ideal weight.). Liver Function Tests: No results for input(s): "AST", "ALT", "ALKPHOS", "BILITOT", "PROT", "ALBUMIN" in the last 168 hours. No results for  input(s): "LIPASE", "AMYLASE" in the last 168 hours. No results for input(s): "AMMONIA" in the last 168 hours. Coagulation Profile: No results for input(s): "INR", "PROTIME" in the last 168 hours. Cardiac Enzymes: No results for input(s): "CKTOTAL", "CKMB", "CKMBINDEX", "TROPONINI", "TROPONINIHS"  in the last 168 hours. BNP (last 3 results) Recent Labs    01/23/23 1228 07/04/23 0254 07/07/23 0538  BNP 663.3* 384.6* 1,463.7*   HbA1C: No results for input(s): "HGBA1C" in the last 72 hours. CBG: No results for input(s): "GLUCAP" in the last 168 hours. Lipid Profile: No results for input(s): "CHOL", "HDL", "LDLCALC", "TRIG", "CHOLHDL", "LDLDIRECT" in the last 72 hours. Thyroid Function Tests: No results for input(s): "TSH", "T4TOTAL", "FREET4", "T3FREE", "THYROIDAB" in the last 72 hours. Anemia Panel: No results for input(s): "VITAMINB12", "FOLATE", "FERRITIN", "TIBC", "IRON", "RETICCTPCT" in the last 72 hours. Urine analysis:    Component Value Date/Time   COLORURINE YELLOW 07/04/2023 1032   APPEARANCEUR HAZY (A) 07/04/2023 1032   APPEARANCEUR Cloudy 04/21/2011 1418   LABSPEC 1.018 07/04/2023 1032   LABSPEC 1.013 04/21/2011 1418   PHURINE 5.0 07/04/2023 1032   GLUCOSEU >=500 (A) 07/04/2023 1032   GLUCOSEU Negative 04/21/2011 1418   HGBUR NEGATIVE 07/04/2023 1032   BILIRUBINUR NEGATIVE 07/04/2023 1032   BILIRUBINUR Negative 04/21/2011 1418   KETONESUR NEGATIVE 07/04/2023 1032   PROTEINUR NEGATIVE 07/04/2023 1032   NITRITE NEGATIVE 07/04/2023 1032   LEUKOCYTESUR NEGATIVE 07/04/2023 1032   LEUKOCYTESUR Negative 04/21/2011 1418    Radiological Exams on Admission: I have personally reviewed images CT HEAD WO CONTRAST ( ) Result Date: 07/28/2023 CLINICAL DATA:  Fall, poly trauma EXAM: CT HEAD WITHOUT CONTRAST CT CERVICAL SPINE WITHOUT CONTRAST TECHNIQUE: Multidetector CT imaging of the head and cervical spine was performed following the standard protocol without intravenous contrast. Multiplanar CT image reconstructions of the cervical spine were also generated. RADIATION DOSE REDUCTION: This exam was performed according to the departmental dose-optimization program which includes automated exposure control, adjustment of the mA and/or kV according to patient size and/or use  of iterative reconstruction technique. COMPARISON:  07/19/2023 FINDINGS: CT HEAD FINDINGS Brain: No evidence of acute infarction, hemorrhage, hydrocephalus, extra-axial collection or mass lesion/mass effect. Chronic areas of cortically based encephalomalacia in the right frontal, parietal, occipital, and temporal cortex on the right with patchy chronic infarction in the right basal ganglia. Small calcifications along the right cerebral convexity which may be from prior calcified embolic disease. Vascular: No hyperdense vessel or unexpected calcification. Skull: Normal. Negative for fracture or focal lesion. Sinuses/Orbits: No acute finding. CT CERVICAL SPINE FINDINGS Alignment: No traumatic malalignment. Levels of mild degenerative anterolisthesis. Skull base and vertebrae: No acute fracture. No primary bone lesion or focal pathologic process. Soft tissues and spinal canal: No prevertebral fluid or swelling. No visible canal hematoma. Disc levels: Degenerative spurring asymmetrically affecting left-sided facets. Upper chest: Mild centrilobular emphysema. IMPRESSION: No evidence of acute intracranial or cervical spine injury. Electronically Signed   By: Ronnette Coke M.D.   On: 07/28/2023 04:08   CT Cervical Spine Wo Contrast Result Date: 07/28/2023 CLINICAL DATA:  Fall, poly trauma EXAM: CT HEAD WITHOUT CONTRAST CT CERVICAL SPINE WITHOUT CONTRAST TECHNIQUE: Multidetector CT imaging of the head and cervical spine was performed following the standard protocol without intravenous contrast. Multiplanar CT image reconstructions of the cervical spine were also generated. RADIATION DOSE REDUCTION: This exam was performed according to the departmental dose-optimization program which includes automated exposure control, adjustment of the mA and/or kV according to patient size and/or use of iterative reconstruction  technique. COMPARISON:  07/19/2023 FINDINGS: CT HEAD FINDINGS Brain: No evidence of acute infarction,  hemorrhage, hydrocephalus, extra-axial collection or mass lesion/mass effect. Chronic areas of cortically based encephalomalacia in the right frontal, parietal, occipital, and temporal cortex on the right with patchy chronic infarction in the right basal ganglia. Small calcifications along the right cerebral convexity which may be from prior calcified embolic disease. Vascular: No hyperdense vessel or unexpected calcification. Skull: Normal. Negative for fracture or focal lesion. Sinuses/Orbits: No acute finding. CT CERVICAL SPINE FINDINGS Alignment: No traumatic malalignment. Levels of mild degenerative anterolisthesis. Skull base and vertebrae: No acute fracture. No primary bone lesion or focal pathologic process. Soft tissues and spinal canal: No prevertebral fluid or swelling. No visible canal hematoma. Disc levels: Degenerative spurring asymmetrically affecting left-sided facets. Upper chest: Mild centrilobular emphysema. IMPRESSION: No evidence of acute intracranial or cervical spine injury. Electronically Signed   By: Ronnette Coke M.D.   On: 07/28/2023 04:08   DG HIP UNILAT WITH PELVIS 2-3 VIEWS LEFT Result Date: 07/28/2023 CLINICAL DATA:  Fall, pain EXAM: DG HIP (WITH OR WITHOUT PELVIS) 2-3V LEFT COMPARISON:  Pelvis today FINDINGS: There is a left femoral neck fracture with varus angulation and impaction. No subluxation or dislocation. Radiation seeds in the region the prostate. IMPRESSION: Left femoral neck fracture with varus angulation and impaction. Electronically Signed   By: Janeece Mechanic M.D.   On: 07/28/2023 03:00   DG Chest Port 1 View Result Date: 07/28/2023 CLINICAL DATA:  Fall EXAM: PORTABLE CHEST 1 VIEW COMPARISON:  None Available. FINDINGS: Heart is mildly enlarged. Lungs clear. No effusions or edema. No pneumothorax. No acute bony abnormality. IMPRESSION: Mild cardiomegaly.  No active disease. Electronically Signed   By: Janeece Mechanic M.D.   On: 07/28/2023 02:59   DG Pelvis  Portable Result Date: 07/28/2023 CLINICAL DATA:  Fall.  Left hip pain EXAM: PORTABLE PELVIS 1-2 VIEWS COMPARISON:  03/22/2021 FINDINGS: There is a left femoral neck fracture with impaction and varus angulation. No subluxation or dislocation. Postoperative changes in the lower lumbar spine. SI joints symmetric and unremarkable. IMPRESSION: Left femoral neck fracture with impaction and varus angulation. Electronically Signed   By: Janeece Mechanic M.D.   On: 07/28/2023 02:59     EKG: My personal interpretation of EKG shows: EKG showed unrecognized rhythm rhythm and irregular rate heart rate 88. Repeating EKG.  Assessment/Plan: Principal Problem:   Fracture of femoral neck, left (HCC) Active Problems:   COPD (chronic obstructive pulmonary disease) (HCC)   Essential hypertension   Hyperlipidemia   Chronic hypoxic respiratory failure (HCC)   Fall at home, initial encounter   Chronic systolic CHF (congestive heart failure) (HCC)   History of CAD (coronary artery disease)   History of AAA (abdominal aortic aneurysm) repair    Assessment and Plan: Left femoral neck fracture Mechanical fall at home -Patient presenting after mechanical fall.  He sustained a fall on the ground level while in the bathroom.  Complaining about left-sided hip joint area pain.  In the ED patient has been given fentanyl 200 mcg. - At presentation to ED patient found to be hypotensive blood pressure 113/64 other hemodynamically stable. - CBC showing leukocytosis 15.4 stable H&H.  BMP showing elevated creatinine 1.45. -CT head, CT cervical spine unremarkable.  No acute finding. -X-ray pelvis and hip showed left femoral neck fracture with varus angulation and impaction. - Orthopedics Dr. Maryjane Snider has been consulted recommended to keep patient n.p.o. and plan to take to the OR today. -Continue pain  control. -Patient NPO.  Starting maintenance fluid LR 50 cc/h.   COPD Chronic hypoxic respiratory failure 4 to 6 L oxygen at  baseline -Stable.  O2 sat 92% to 95% on 4 L oxygen. -Continue Spiriva once daily and DuoNeb as needed for wheezing shortness of breath.  Continue to check pulse ox and supplemental oxygen.  Essential hypertension Systolic heart failure reduced EF 40 to 45% Hypotension on midodrine - Patient reported he has been taking Lasix, spironolactone and midodrine.  Per chart review patient was discharged from the hospital 3/3.  He was admitted for sepsis in the setting of pneumonia.  During that time patient was remained hypotensive and started on midodrine.  Entresto was discontinued. -Patient's blood pressure is soft 13/64.  Even patient is complaining about 8 out of 10 pain response blood pressure is volume 140/60.  Holding Lasix.  Continue spironolactone.  If patient redevelops hypotension will resume midodrine.   Hyperlipidemia -Continue Lipitor.  AKI on CKD stage II -Elevated creatinine 1.45.  Baseline creatinine is around 1.01-1.16 and GFR above 60. Concern for prerenal AKI in the setting of hypotension.  Patient is euvolemic on physical exam and chest x-ray no evidence of pulmonary vascular congestion. Starting LR 50 cc/h.  Continue monitor renal function.  Holding Lasix.   History of CAD History of AAA repair -Holding aspirin for today morning and will continue from tomorrow postoperatively.  Continue Lipitor.    DVT prophylaxis:  SQ Heparin Code Status:  Full Code Diet: Currently NPO for orthopedic intervention. Family Communication: Currently no family member at bedside. Disposition Plan: Monitor blood pressure and resume Lasix/midodrine as appropriate.  Pending formal orthopedics consult and plan to take to the OR today. Consults: Orthopedics Admission status:   Inpatient, Telemetry bed  Severity of Illness: The appropriate patient status for this patient is INPATIENT. Inpatient status is judged to be reasonable and necessary in order to provide the required intensity of service  to ensure the patient's safety. The patient's presenting symptoms, physical exam findings, and initial radiographic and laboratory data in the context of their chronic comorbidities is felt to place them at high risk for further clinical deterioration. Furthermore, it is not anticipated that the patient will be medically stable for discharge from the hospital within 2 midnights of admission.   * I certify that at the point of admission it is my clinical judgment that the patient will require inpatient hospital care spanning beyond 2 midnights from the point of admission due to high intensity of service, high risk for further deterioration and high frequency of surveillance required.Aaron Aas    Che Below, MD [facility]  How to contact the [facility] Attending or Consulting provider 7A - 7P or covering provider during after hours 7P -7A, for this patient.  Check the care team in [facility] and look for a) attending/consulting [facility] provider listed and b) the [facility] team listed Log into [email or web address] and use [facility]'s universal password to access. If you do not have the password, please contact the hospital operator. Locate the [facility] provider you are looking for under [facility] and page to a number that you can be directly reached. If you still have difficulty reaching the provider, please page the [facility] (Director on Call) for the Hospitalists listed on amion for assistance.  07/28/2023, 6:00 AM

## 2023-07-28 NOTE — Consult Note (Signed)
 Orthopedic Consultation Note  Current Hospital Day : Hospital Day: 1  Reason For Consult: Left hip femoral neck fracture  History of Present Illness:  [name] is a 78 y.o. male who presented to the emergency department overnight due to a fall and left hip pain.  He was found to have a displaced left femoral neck fracture for which orthopedic surgery was consulted.  Presently he endorses left hip pain.  No pain elsewhere in his extremity or body.  He states that he lives at home by himself and ambulates with a walker at baseline  Past Medical History:  Diagnosis Date   AAA (abdominal aortic aneurysm) (HCC)    a.  CTA 7/19: measured 4.5 x 5.3 cm and greatest transverse dimensions   Abnormal nuclear stress test    a.  Myoview 2012: anterior wall ischemia with an estimated EF of 26%.  This was a new wall motion abnormality as well as a newly reduced EF   Brain bleed (HCC)    COPD (chronic obstructive pulmonary disease) (HCC)    Coronary artery disease    a.  Posterior MI in 2002 status post BMS to the LCx; b. LHC 2012: 95% stenosis of the proximal LAD, 95% stenosis of the mid LAD, 30% in-stent restenosis of the mid left circumflex with a second lesion of diffuse 50% stenosis.  The patient underwent successful PCI/BMS to the mid LAD with 0% residual stenosis, LV gram not performed   GERD (gastroesophageal reflux disease)    History of kidney stones    History of prostate cancer    Hyperlipidemia    Hypertension    Myocardial infarction [facility])    Neuromuscular disorder (HCC)    Prostate cancer (HCC)    a.  Status post seeding   PVD (peripheral vascular disease) (HCC)    Stroke [facility])     Past Surgical History:  Procedure Laterality Date   BACK SURGERY  2009/2010   x 2   BOWEL RESECTION N/A 03/18/2021   Procedure: SMALL BOWEL RESECTION;  Surgeon: Anda Bamberg, MD;  Location: [facility] OR;  Service: General;  Laterality: N/A;   CARDIAC CATHETERIZATION  2002   stent placement [facility]   ENDOVASCULAR REPAIR/STENT GRAFT N/A 01/02/2018   Procedure: ENDOVASCULAR REPAIR/STENT GRAFT;  Surgeon: Celso College, MD;  Location: [facility] INVASIVE CV LAB;  Service: Cardiovascular;  Laterality: N/A;   FOOT SURGERY     bilateral    HERNIA REPAIR     bilateral    INGUINAL HERNIA REPAIR Left 03/18/2021   Procedure: INGUINAL HERNIA REPAIR;  Surgeon: Anda Bamberg, MD;  Location: [facility] OR;  Service: General;  Laterality: Left;   INSERTION OF MESH Left 03/18/2021   Procedure: INSERTION OF MESH;  Surgeon: Anda Bamberg, MD;  Location: [facility] OR;  Service: General;  Laterality: Left;   KNEE SURGERY     right knee    PROSTATE SURGERY  09/2009   prostate implant   SPINE SURGERY     UMBILICAL HERNIA REPAIR N/A 03/18/2021   Procedure: UMBILICAL HERNIA REPAIR;  Surgeon: Anda Bamberg, MD;  Location: [facility] OR;  Service: General;  Laterality: N/A;    Prior to Admission medications   Medication Sig Start Date End Date Taking? Authorizing Provider  acetaminophen (TYLENOL) 325 MG tablet Take 2 tablets (650 mg total) by mouth every 6 (six) hours as needed for mild pain (or Fever >/= 101). 04/28/21  Yes Elgergawy, Ardia Kraft, MD  albuterol (PROVENTIL) (2.5  MG/3ML) 0.083% nebulizer solution Take 2.5 mg by nebulization every 6 (six) hours as needed. 10/09/22  Yes [provider]  albuterol (VENTOLIN HFA) 108 (90 Base) MCG/ACT inhaler Inhale 2 puffs into the lungs every 6 (six) hours as needed for wheezing. 04/07/21 11/30/24 Yes Medina-Vargas, Monina C, NP  aspirin EC 81 MG tablet Take 81 mg by mouth daily.   Yes [provider]  atorvastatin (LIPITOR) 80 MG tablet Take 1 tablet (80 mg total) by mouth daily. 06/28/23  Yes Shawnee Dellen A, FNP  dapagliflozin propanediol (FARXIGA) 10 MG TABS tablet Take 1 tablet (10 mg total) by mouth daily. 06/28/23  Yes Shawnee Dellen A, FNP  furosemide (LASIX) 20 MG tablet Take 1 tablet (20 mg total) by mouth daily. Take 40 mg (2 tablets) on Monday and Friday only.  Take Lasix 20 mg (1 tablet) on the other days. 06/28/23  Yes Hackney, Brian Campanile A, FNP  ipratropium-albuterol (DUONEB) 0.5-2.5 (3) MG/3ML SOLN Use twice a day scheduled and every 4 hours as needed for shortness of breath and wheezing 07/07/23  Yes Singh, Prashant K, MD  midodrine (PROAMATINE) 5 MG tablet Take 1 tablet (5 mg total) by mouth 2 (two) times daily with a meal. 07/11/23  Yes Singh, Prashant K, MD  Multiple Vitamins-Minerals (MULTIVITAMIN WITH MINERALS) tablet Take 1 tablet by mouth daily.   Yes [provider]  omeprazole (PRILOSEC) 40 MG capsule TAKE 1 CAPSULE BY MOUTH DAILY USUALLY BEFORE BREAKFAST 04/07/21  Yes Medina-Vargas, Monina C, NP  spironolactone (ALDACTONE) 25 MG tablet Take 0.5 tablets (12.5 mg total) by mouth daily. FOR CHRONIC COMBINED CHF 06/28/23  Yes Shawnee Dellen A, FNP  tiotropium (SPIRIVA HANDIHALER) 18 MCG inhalation capsule Place 1 capsule (18 mcg total) into inhaler and inhale daily. 07/07/23 07/06/24 Yes Singh, Prashant K, MD  methylPREDNISolone (MEDROL DOSEPAK) 4 MG TBPK tablet follow package directions 07/07/23   Cala Castleman, MD    Physical Examination Left Lower Extremity: Range of motion deferred + ankle dorsiflexion/plantarflexion/EHL SILT SP/DP/T Foot wwp   Imaging: X-rays of the pelvis and left hip reviewed and interpreted demonstrating displaced left femoral neck fracture  Assessment:   [name] is a 78 y.o. male with displaced left femoral neck fracture.  I discussed both operative and nonoperative treatment with him.  Specifically in terms of operative management I discussed cemented hip hemiarthroplasty.  We discussed associated risks including risk of anesthesia, neurovascular injury, leg length discrepancy, periprosthetic fracture, infection, instability and hip pain among others.  Given his limited ability for mobility without surgery, he was in favor of proceeding with left hip hemiarthroplasty.  Plan:   OR today for left  hip Remain n.p.o. Appreciate medicine pre-op eval/optimization

## 2023-07-28 NOTE — ED Provider Notes (Signed)
 [facility] Emergency Department Provider Note MRN:  [id number]  Arrival date & time: 07/28/23     Chief Complaint   Fall   History of Present Illness   [name] is a 78 y.o. year-old male presents to the ED with chief complaint of left hip pain.  He fell out of bed.  He denies head injury, neck pain, chest pain, or abdominal pain.  He hasn't been able to walk since the fall.  He states that he feels the pain in the left groin.  He denies any other injuries.  He wears 4L [place] all the time.  History provided by patient.   Review of Systems  Pertinent positive and negative review of systems noted in HPI.    Physical Exam   Vitals:   07/28/23 0222  BP: 113/64  Pulse: 74  Resp: 16  Temp: 97.8 F (36.6 C)  SpO2: 95%    CONSTITUTIONAL:  non toxic-appearing, NAD NEURO:  Alert and oriented x 3, CN 3-12 grossly intact EYES:  eyes equal and reactive ENT/NECK:  Supple, no stridor  CARDIO:  normal rate, regular rhythm, appears well-perfused  PULM:  No respiratory distress, CTAB GI/GU:  non-distended,  MSK/SPINE:  No gross deformities, no edema, left hip TTP, LLE held in flexion  SKIN:  no rash, atraumatic   *Additional and/or pertinent findings included in MDM below  Diagnostic and Interventional Summary    EKG Interpretation Date/Time:    Ventricular Rate:    PR Interval:    QRS Duration:    QT Interval:    QTC Calculation:   R Axis:      Text Interpretation:         Labs Reviewed  CBC WITH DIFFERENTIAL/PLATELET - Abnormal; Notable for the following components:      Result Value   WBC 15.4 (*)    RBC 4.11 (*)    Hemoglobin 12.7 (*)    Neutro Abs 13.7 (*)    Abs Immature Granulocytes 0.09 (*)    All other components within normal limits  BASIC METABOLIC PANEL WITH GFR - Abnormal; Notable for the following components:   Glucose, Bld 133 (*)    Creatinine, Ser 1.45 (*)    Calcium 8.2 (*)    GFR, Estimated 49 (*)    All other  components within normal limits  CBG MONITORING, ED    CT HEAD WO CONTRAST ( )  Final Result    CT Cervical Spine Wo Contrast  Final Result    DG Pelvis Portable  Final Result    DG Chest Port 1 View  Final Result    DG HIP UNILAT WITH PELVIS 2-3 VIEWS LEFT  Final Result      Medications  morphine (PF) 4 MG/ML injection 4 mg (4 mg Intravenous Given 07/28/23 0335)  morphine (PF) 4 MG/ML injection 4 mg (4 mg Intravenous Given 07/28/23 0522)     Procedures  /  Critical Care Procedures  ED Course and Medical Decision Making  I have reviewed the triage vital signs, the nursing notes, and pertinent available records from the EMR.  Social Determinants Affecting Complexity of Care: Patient has no clinically significant social determinants affecting this chief complaint..   ED Course:    Medical Decision Making Patient with fall from bed and left hip pain.  Will check imaging of the left hip along with head, neck, and chest.    I am very suspicious of a fracture, so will add screening labs.  No other traumatic injuries noted.   Amount and/or Complexity of Data Reviewed Labs: ordered. Radiology: ordered and independent interpretation performed.    Details: Left femoral neck fx ECG/medicine tests: ordered.  Risk Prescription drug management. Decision regarding hospitalization.         Consultants: I consulted with Hospitalist, Dr. Sundil, who is appreciated for admitting. and I consulted with Dr. Maryjane Snider, who recommends keeping patient NPO and will see patient this morning.   Treatment and Plan: Patient's exam and diagnostic results are concerning for hip fracture.  Feel that patient will need admission to the hospital for further treatment and evaluation.    Final Clinical Impressions(s) / ED Diagnoses     ICD-10-CM   1. Closed fracture of left hip, initial encounter [facility])  S72.002A       ED Discharge Orders     None         Discharge  Instructions Discussed with and Provided to Patient:   Discharge Instructions   None      Sherel Dikes, PA-C 07/28/23 0524    Teddi Favors, DO 07/29/23 [phone]

## 2023-07-28 NOTE — Anesthesia Postprocedure Evaluation (Signed)
 Anesthesia Post Note  Patient: [name]  Procedure(s) Performed: LEFT HIP HEMIARTHROPLASTY (Left)     Patient location during evaluation: PACU Anesthesia Type: MAC and Spinal Level of consciousness: awake and alert Pain management: pain level controlled Vital Signs Assessment: post-procedure vital signs reviewed and stable Respiratory status: spontaneous breathing, nonlabored ventilation, respiratory function stable and patient connected to nasal cannula oxygen Cardiovascular status: stable and blood pressure returned to baseline Postop Assessment: no apparent nausea or vomiting Anesthetic complications: no   There were no known notable events for this encounter.  Last Vitals:  Vitals:   07/28/23 1834 07/28/23 1943  BP: 113/68 (!) 105/54  Pulse: 78   Resp: 16   Temp: 36.6 C 37.1 C  SpO2: 91% 95%    Last Pain:  Vitals:   07/28/23 1943  TempSrc: Oral  PainSc:                  Valente Gaskin Psalm Arman

## 2023-07-28 NOTE — Progress Notes (Signed)
 Patient seen and examined after midnight  78 year old current [place] resident HTN HLD  Gold 2 COPD with chronic hypoxia still smoker HFpEF 45% AAA status post endovascular repair with aorto bi-iliac stent grafting Underlying CAD with stenting and balloon angioplasty 12/2010 Reported CVA previously Subdural hemorrhage status post hospitalization 11 7-11 04/05/2021 Incarcerated hernia status postrepair 03/2021  Recurrent prior admissions for HF R EF exacerbations  Recent admission for severe sepsis pulmonary source superimposed on chronic hypoxic failure and was discharged to skilled-blood pressures were relatively low GDMT was transitioned over and placed on low-dose midodrine  Recent ED visit for 325 for fall discharged back to facility He returned for 1225 for fall and found to have left hip pelvic pain  CXR mild cardiomegaly left femoral neck fracture with varus angulation noted portable pelvis confirmed this CT cervical spine no evidence for intracranial cervical spine injury CT head no evidence of intracranial bleed  Sodium 141 potassium 3.5 BUN/creatinine 15/1.4 above baseline (22/1.1) WBC 15 hemoglobin 12 platelet 179  Hip fracture-definitive management per orthopedics-pain control as per them follow hemoglobin in a.m. anticoagulation per them Mild AKI continue IVF 50 cc/H-labs a.m. cautious with fluids HF R EF 45% Careful with IV fluids as above Lasix held from admission continue Aldactone 12.5 cannot tolerate rest of GDMT as hypotensive Hold midodrine 5 twice daily for now Goal COPD stage II smoker Inhalers etc. as per home regimen with DuoNeb and Incruse Ellipta  Stable for surgery from end.  No charge   Abbie Hock, MD [facility] 2:15 PM

## 2023-07-28 NOTE — Progress Notes (Signed)
 Orthopaedic Surgery Progress Note  Comfortable in PACU. NAE.  Exam: Left Lower Extremity: Dressing c/d/i + ankle dorsiflexion/plantarflexion/EHL SILT SP/DP/T Foot wwp   Assessment: 78 y.o. male * Day of Surgery * status post left hip hemiarthroplasty for femoral neck hip fracture  Plan: Full weightbearing as tolerated using a walker.  No hip precautions are required Maintain Aquacel dressing until first postop appointment.  Okay to change with regular dry gauze if needed. OK to resume DVT chemoppx or anticoagulation on post op day 1; recommend 4 weeks of chemoppx with ASA 81 BID if no other AC is in place Perioperative ancef for 24 PT beginning on POD#1 Discharge instructions placed in Epic

## 2023-07-28 NOTE — Discharge Instructions (Signed)
 Orthopaedic Surgery Discharge Instructions  Surgery: Left hip hemiarthroplasty for femoral neck hip fracture  Weight bearing: Full weightbearing as tolerated using a walker  Dressings/Incisions: Keep clean and dry at all times. If dressings become wet or soiled, they should be changed with regular dry gauze or a clean bandage. It is OK to shower, gently pat incision or dressing dry afterwards. Leave steri-strips in place until they fall off on their own. DO NOT put anything on your incisions (cream, ointment, lotion, etc).   Follow up appointment: You are scheduled to follow up with Dr. Thad Ranger. If you do not know when your follow up appointment is, please call [phone] to schedule your appointment. Our office is located at [address] [place], [place], [address].  To reach our office with any questions or concerns please call [phone].

## 2023-07-28 NOTE — Anesthesia Preprocedure Evaluation (Addendum)
 Anesthesia Evaluation  Patient identified by MRN, date of birth, ID band Patient awake    Reviewed: Allergy & Precautions, NPO status , Patient's Chart, lab work & pertinent test results  Airway Mallampati: II  TM Distance: >3 FB Neck ROM: Full    Dental  (+) Poor Dentition   Pulmonary COPD,  COPD inhaler and oxygen dependent, Current Smoker and Patient abstained from smoking.    + decreased breath sounds      Cardiovascular hypertension, + CAD, + Past MI (2002), + Cardiac Stents, + Peripheral Vascular Disease (AAA) and +CHF   Rhythm:Regular Rate:Normal  ECHO 2024:  1. Left ventricular ejection fraction, by estimation, is 40 to 45%. The  left ventricle has mildly decreased function. The left ventricle  demonstrates global hypokinesis. There is moderate left ventricular  hypertrophy. Left ventricular diastolic  parameters are consistent with Grade I diastolic dysfunction (impaired  relaxation).   2. Right ventricular systolic function is normal. The right ventricular  size is mildly enlarged. There is normal pulmonary artery systolic  pressure.   3. Left atrial size was mildly dilated.   4. Right atrial size was mildly dilated.   5. The mitral valve is grossly normal. Mild mitral valve regurgitation.  No evidence of mitral stenosis.   6. The aortic valve was not well visualized. Aortic valve regurgitation  is not visualized. No aortic stenosis is present.   7. Pulmonic valve regurgitation not well-assessed.   8. The inferior vena cava is normal in size with greater than 50%  respiratory variability, suggesting right atrial pressure of 3 mmHg.      Neuro/Psych CVA, No Residual Symptoms  negative psych ROS   GI/Hepatic Neg liver ROS,GERD  ,,  Endo/Other  Lab Results      Component                Value               Date                      NA                       141                 07/28/2023                K                         3.5                 07/28/2023                CO2                      28                  07/28/2023                GLUCOSE                  133 (H)             07/28/2023                BUN                      15  07/28/2023                CREATININE               1.45 (H)            07/28/2023                CALCIUM                  8.2 (L)             07/28/2023                GFR                      67.38               10/25/2018                EGFR                     67                  04/13/2023                GFRNONAA                 49 (L)              07/28/2023             Renal/GU   negative genitourinary   Musculoskeletal  (+) Arthritis ,  Mechanical fall at home   Abdominal Normal abdominal exam  (+)   Peds  Hematology  (+) Blood dyscrasia, anemia Lab Results      Component                Value               Date                      WBC                      13.1 (H)            07/28/2023                HGB                      11.9 (L)            07/28/2023                HCT                      37.0 (L)            07/28/2023                MCV                      97.4                07/28/2023                PLT                      167  07/28/2023              Anesthesia Other Findings chronic hypoxic respiratory failure 4 to 6 L oxygen at home at baseline, CHF with reduced EF 40 to 45%, essential hypertension, CAD, hyperlipidemia, AAA status post endovascular repair, and COPD  Reproductive/Obstetrics                             Anesthesia Physical Anesthesia Plan  ASA: 4  Anesthesia Plan: MAC and Spinal   Post-op Pain Management:    Induction:   PONV Risk Score and Plan: Ondansetron, Dexamethasone, Propofol infusion and Treatment may vary due to age or medical condition  Airway Management Planned: Simple Face Mask and Nasal Cannula  Additional Equipment: None  Intra-op Plan:    Post-operative Plan:   Informed Consent: I have reviewed the patients History and Physical, chart, labs and discussed the procedure including the risks, benefits and alternatives for the proposed anesthesia with the patient or authorized representative who has indicated his/her understanding and acceptance.     Dental advisory given  Plan Discussed with: CRNA  Anesthesia Plan Comments:        Anesthesia Quick Evaluation

## 2023-07-28 NOTE — Brief Op Note (Signed)
 07/28/2023  4:22 PM  PATIENT:  [name]  78 y.o. male  PRE-OPERATIVE DIAGNOSIS:  Left hip fracture  POST-OPERATIVE DIAGNOSIS:  * No post-op diagnosis entered *  PROCEDURE:  Procedure(s): LEFT HIP HEMIARTHROPLASTY (Left)  SURGEON:  Surgeons and Role:    Marilee Showers, MD - Primary  PHYSICIAN ASSISTANT:   ASSISTANTS: none   ANESTHESIA:   spinal  EBL:  90 mL   BLOOD ADMINISTERED:none  DRAINS: none   LOCAL MEDICATIONS USED:  NONE  SPECIMEN:  No Specimen  DISPOSITION OF SPECIMEN:  N/A  COUNTS:  YES  TOURNIQUET:  * No tourniquets in log *  DICTATION: .Dragon Dictation  PLAN OF CARE: Admit to inpatient   PATIENT DISPOSITION:  PACU - hemodynamically stable.   Delay start of Pharmacological VTE agent (>24hrs) due to surgical blood loss or risk of bleeding: no

## 2023-07-28 NOTE — ED Triage Notes (Signed)
 BIB EMS from home after falling while trying to ambulate to the bathroom with his walker. Pt states he fell on his left hip, endorsing L hip and pelvic pain. Dp's bilaterally. On 4L O2 at home since recent d/c from [facility]. 200mcg fentanyl given by EMS en route.

## 2023-07-28 NOTE — Op Note (Signed)
 Operative Note  [name]  Surgery Date: 07/28/2023 Surgeon: Marilee Showers, MD Assistant(s): None  Preop Diagnosis(es):  Left hip displaced femoral neck fracture  Postop Diagnosis(es):  Left hip displaced femoral neck fracture  Operative Procedure(s): Left hip hemiarthroplasty  Anesthesia: The patient had administration of spinal anesthesia. Further details can be found in the anesthesia record.  Estimated Blood Loss: 100 mL  Complications:  None noted intraoperatively  Drains: None  Implants:  [employer] cemented Synergy stem, size 12, +4 ball with 51mm shell. Full detailed list below.  Indications:  [name] is a 78 y.o. year old male who who fell and injured his left hip overnight last night.  He presented emergency department where is found have a displaced femoral neck fracture.  I discussed with both him and his son [name] operative management versus nonoperative treatment.  Specifically we discussed cemented hip hemiarthroplasty and the associated risks and benefits including early mobilization versus periprosthetic fracture, infection, instability, leg length discrepancy neurovascular injury among others..  After thorough discussion of the risks and benefits of surgical management and alternative nonoperative treatment options, they elected to proceed with surgical treatment. Risks and complications were discussed and understood including, but not limited to, bleeding, infection, stiffness, numbness, damage to surrounding structures (including blood vessels and nerves), failure of the procedure, need for secondary or revision procedures, failure of healing, incomplete functional recovery, and worsening or chronic pain. Additional risks pertinent to the surgery and anesthesia also include pulmonary compromise, blood clots/pulmonary embolism, cardiac complications, and death. No guarantees were stated or implied. All questions were answered to the best of my ability  and the patient verbalized understanding.    Description of Procedure:  The patient was identified in the holding area, taken to the operating room and underwent successful induction of anesthesia. They were then placed on the operating table in a lateral decubitus position, with all bony prominences well padded. Preprocedure antibiotics were administered. A time out was performed and all parties were in agreement with the patient identification, surgical site and planned procedure. The extremity was then prepped and draped in usual sterile fashion. A second time out was performed prior to incision, once again confirming the patient, site, procedure and expectations of surgery.  We centered our incision over the greater trochanter, extending proximally 5 cm and distally along the shaft of the femur.  Hemostasis was obtained as the subcutaneous layers and adipose was divided using electrocautery.  The iliotibial band was identified and visualized along the length of the incision.  This was incised along the femur, trochanter and extending proximally.  We then placed a Charnley retractor and excised all bursal tissue.  This gave us  a good view of the origin of the vastus lateralis and the insertion of the gluteus medius.  The gluteus medius was bluntly divided at the the 40% anterior, 60% posterior mark and this split was taken down to the greater trochanter.  This tendinous portion of the inferior aspect of the gluteus medius was then released from the trochanter with electrocautery and tagged with a #2 FiberWire suture.  We then performed a T capsulotomy, peeling the gluteus minimus tendon and capsule in 1 sleeve and extending distally to the origin of the vastus lateralis.  The sleeve of tissue was also tagged with a #2 FiberWire.  Superiorly, a small segment of capsule was excised and the remaining superior aspect of the gluteus medius was retracted superiorly.  At this point we had good visualization  of  the fracture and lesser trochanter.  The leg was flexed and externally rotated delivering the fracture out of the wound.  Using a cutting guide and based on our preoperative template and landmarks of the lesser and greater trochanter, we marked our neck cut.  This neck cut was then performed using an oscillating saw.  We then turned our attention to the acetabulum and using a corkscrew device the femoral head was removed.  This was measured to be 51 mm.  The acetabulum was inspected and noted to have adequate cartilage coverage and integrity.  We trialed a 51 mm head trial which seemed to be the appropriate size.  The acetabulum was irrigated to ensure removal of any fracture debris and soft tissue.  We then began preparation of the femur which was performed using a box osteotome followed by a canal finder and sequential diaphyseal reamers on hand up to a size 12.  Broaching was then performed in a sequential manner also up to a size 12.  The head and liner trials were placed and the hip was reduced.  It was felt to have adequate stability and soft tissue tension as well as equal leg lengths.  The hip was dislocated and the trials and broach were removed.  We prepared the femur for cementation including irrigation, placement of a cement restrictor and drying of the canal.  A sponge was placed into the acetabulum.  Cement was then placed in the canal and gently pressurized.  A size 12 stem was placed ensuring adequate coronal and axial alignment, matching their native anteversion.  Once the cement had hardened, we placed the head and liner trials, removed the sponge from the acetabulum and again reduced the hip.  The soft tissue tension was appropriate, clinically the leg lengths were appropriate, the hip was stable in both flexion, adduction, internal rotation and in extension with external rotation.  A flatplate radiograph also verified appropriate leg lengths and stem positioning.  We then dislocated the hip,  removed the trials and placed the final head and liner implants.  The acetabulum was irrigated one last time and inspected for any debris.  The hip was again reduced and taken through range of motion showing appropriate soft tissue tension and no instability.  The wound was irrigated using 3 L of normal saline.  We then began our closure beginning with the previously tagged capsule and gluteus minimus tendon which was closed using #2 FiberWire.  The inferior aspect of the gluteus medius was then closed using #2 FiberWire.  The distal vastus lateralis split was closed using 0 Vicryl.  The IT band was closed using a combination of 0 Vicryl and strata fix.  The skin was closed using 2-0 Vicryl and a running 3-0 Monocryl.  A sterile dressing was placed and she was transferred to the PACU in stable condition.   All counts at the end of the case were correct.  Post-Operative Condition: The patient was transferred to the PACU in stable condition.  Post-Operative Plan: They will be weightbearing as tolerated using a walker.  They will follow our hip fracture rehabilitation protocol.  We will see them back in 10 days for a wound check and for further review of surgical findings.  Implant Record:  Implant Name Type Inv. Item Serial No. Manufacturer Lot No. LRB No. Used Action  KIT PREP HIP W/CEMENT RESTRICT - [id number] Miscellaneous KIT PREP HIP W/CEMENT [employer] [id number] Left 1 Implanted  CEMENT BONE  R 1X40 - [id number] Cement CEMENT BONE R 1X40  ZIMMER RECON(ORTH,TRAU,BIO,SG) [id number] Left 1 Implanted  CEMENT BONE R 1X40 - [id number] Cement CEMENT BONE R 1X40  ZIMMER RECON(ORTH,TRAU,BIO,SG) [id number] Left 1 Implanted  CENTRALIZER STEM DISTAL 10 - [id number] Knees CENTRALIZER STEM DISTAL 10  [employer] [id number] Left 1 Implanted  femoral high offset 12    [employer] [id number] Left 1 Implanted  shell liner 28/51     [id number] Left 1  Implanted  LINER BIPOLAR +4 - [id number] Hips LINER BIPOLAR +4  [employer] [id number] Left 1 Implanted

## 2023-07-28 NOTE — Transfer of Care (Signed)
 Immediate Anesthesia Transfer of Care Note  Patient: [name]  Procedure(s) Performed: LEFT HIP HEMIARTHROPLASTY (Left)  Patient Location: PACU  Anesthesia Type:Spinal  Level of Consciousness: awake  Airway & Oxygen Therapy: Patient Spontanous Breathing  Post-op Assessment: Report given to RN and Post -op Vital signs reviewed and stable  Post vital signs: Reviewed and stable  Last Vitals:  Vitals Value Taken Time  BP 100/80 07/28/23 1630  Temp 36 C 07/28/23 1618  Pulse 81 07/28/23 1633  Resp 21 07/28/23 1633  SpO2 96 % 07/28/23 1633  Vitals shown include unfiled device data.  Last Pain:  Vitals:   07/28/23 1618  TempSrc:   PainSc: Asleep      Patients Stated Pain Goal: 3 (07/28/23 1136)  Complications: There were no known notable events for this encounter.

## 2023-07-29 DIAGNOSIS — S72002A Fracture of unspecified part of neck of left femur, initial encounter for closed fracture: Secondary | ICD-10-CM | POA: Diagnosis not present

## 2023-07-29 LAB — COMPREHENSIVE METABOLIC PANEL WITH GFR
ALT: 16 U/L (ref 0–44)
AST: 28 U/L (ref 15–41)
Albumin: 2.5 g/dL — ABNORMAL LOW (ref 3.5–5.0)
Alkaline Phosphatase: 65 U/L (ref 38–126)
Anion gap: 9 (ref 5–15)
BUN: 19 mg/dL (ref 8–23)
CO2: 30 mmol/L (ref 22–32)
Calcium: 8.6 mg/dL — ABNORMAL LOW (ref 8.9–10.3)
Chloride: 100 mmol/L (ref 98–111)
Creatinine, Ser: 1.21 mg/dL (ref 0.61–1.24)
GFR, Estimated: >60 mL/min (ref >60)
Glucose, Bld: 101 mg/dL — ABNORMAL HIGH (ref 70–99)
Potassium: 4.2 mmol/L (ref 3.5–5.1)
Sodium: 139 mmol/L (ref 135–145)
Total Bilirubin: 1 mg/dL (ref 0.0–1.2)
Total Protein: 6.1 g/dL — ABNORMAL LOW (ref 6.5–8.1)

## 2023-07-29 LAB — CBC WITH DIFFERENTIAL/PLATELET
Abs Immature Granulocytes: 0.07 10*3/uL (ref 0.00–0.07)
Basophils Absolute: 0 10*3/uL (ref 0.0–0.1)
Basophils Relative: 0 %
Eosinophils Absolute: 0 10*3/uL (ref 0.0–0.5)
Eosinophils Relative: 0 %
HCT: 36.4 % — ABNORMAL LOW (ref 39.0–52.0)
Hemoglobin: 11.7 g/dL — ABNORMAL LOW (ref 13.0–17.0)
Immature Granulocytes: 1 %
Lymphocytes Relative: 8 %
Lymphs Abs: 0.9 10*3/uL (ref 0.7–4.0)
MCH: 30.8 pg (ref 26.0–34.0)
MCHC: 32.1 g/dL (ref 30.0–36.0)
MCV: 95.8 fL (ref 80.0–100.0)
Monocytes Absolute: 0.6 10*3/uL (ref 0.1–1.0)
Monocytes Relative: 6 %
Neutro Abs: 9.7 10*3/uL — ABNORMAL HIGH (ref 1.7–7.7)
Neutrophils Relative %: 85 %
Platelets: 186 10*3/uL (ref 150–400)
RBC: 3.8 MIL/uL — ABNORMAL LOW (ref 4.22–5.81)
RDW: 13.1 % (ref 11.5–15.5)
WBC: 11.3 10*3/uL — ABNORMAL HIGH (ref 4.0–10.5)
nRBC: 0 % (ref 0.0–0.2)

## 2023-07-29 MED ORDER — ASPIRIN 81 MG PO TBEC
81.0000 mg | DELAYED_RELEASE_TABLET | Freq: Two times a day (BID) | ORAL | Status: DC
  Administered 2023-07-29 – 2023-08-01 (×6): 81 mg via ORAL
  Filled 2023-07-29 (×6): qty 1

## 2023-07-29 MED ORDER — GUAIFENESIN ER 600 MG PO TB12
600.0000 mg | ORAL_TABLET | Freq: Two times a day (BID) | ORAL | Status: DC
  Administered 2023-07-29 – 2023-08-01 (×7): 600 mg via ORAL
  Filled 2023-07-29 (×7): qty 1

## 2023-07-29 NOTE — Progress Notes (Signed)
 [facility] ROUNDING NOTE [facility] ZOX:[id number]  DOB: 1946-02-11  DOA: 07/28/2023  PCP: Sari Cunning, MD  07/29/2023,10:03 AM  LOS: 1 day    Code Status: Full code   from: Skilled current Dispo: Likely skilled   78 year old current [facility] resident HTN HLD  Gold 2 COPD with chronic hypoxia still smoker HFpEF 45% AAA status post endovascular repair with aorto bi-iliac stent grafting Underlying CAD with stenting and balloon angioplasty 12/2010 Reported CVA previously Subdural hemorrhage status post hospitalization 11 7-11 04/05/2021 Incarcerated hernia status postrepair 03/2021   Recurrent prior admissions for HF R EF exacerbations   Recent admission for severe sepsis pulmonary source superimposed on chronic hypoxic failure and was discharged to skilled-blood pressures were relatively low GDMT was transitioned over and placed on low-dose midodrine   Recent ED visit for 325 for fall discharged back to facility He returned for 1225 for fall and found to have left hip pelvic pain   CXR mild cardiomegaly left femoral neck fracture with varus angulation noted portable pelvis confirmed this CT cervical spine no evidence for intracranial cervical spine injury CT head no evidence of intracranial bleed   Sodium 141 potassium 3.5 BUN/creatinine 15/1.4 above baseline (22/1.1) WBC 15 hemoglobin 12 platelet 179    Plan  Acute hip fracture left-sided Postop management per Ortho-postop prophylaxis aspirin 81 twice daily, Aquacel versus dry gauze dressing Pain control Oxy IR 5 every 4 as needed moderate pain, Dilaudid 0.5-1 every 3 as needed Probably requires return to skilled facility  Blood loss anemia expected from surgery Transfused 1 unit PRBC IntraOp Follow trends Aspirin 81 twice daily as above for prophylaxis  HFrEF EF 45% no acute component Perioperative fluids have been discontinued can continue regular diet GDMT limited because of prior to admission hypotension-continues on only  Aldactone 12.5, Farxiga on hold Patient is on varying doses of Lasix will place on Lasix every other day low-dose at discharge and reassess as an outpatient for returning to her usual dosing Patient does have hypotension midodrine has been held for now  AAA status post endovascular repair with by iliac stent grafting in the past Underlying CAD with stenting 2012 Reported CVA Continue atorvastatin 80 daily, aspirin as above  COPD Gold stage II chronic hypoxia on 2 L Continue oxygen Spiriva 18 mcg albuterol as needed as needed   DVT prophylaxis: Heparin  Status is: Inpatient Remains inpatient appropriate because:   Likely require skilled   Subjective: Pain is 7 on 10 overall feels fair No other real issues overnight eating drinking  Objective + exam Vitals:   07/28/23 1834 07/28/23 1943 07/29/23 0445 07/29/23 0852  BP: 113/68 (!) 105/54 124/66 112/82  Pulse: 78  74 69  Resp: 16  17 18   Temp: 97.8 F (36.6 C) 98.8 F (37.1 C) 97.9 F (36.6 C) 97.7 F (36.5 C)  TempSrc: Oral Oral Oral Oral  SpO2: 91% 95% 97% 97%  Weight:      Height:       Filed Weights   07/28/23 1258  Weight: 76.5 kg    Examination: EOMI NCAT no focal deficit no icterus no pallor--- is on oxygen Chest is clear anteriorly-no wheeze no rales rhonchi S1-S2 no murmur Abdomen soft no rebound ROM intact no focal deficit but limited to left lower extremity with pain No lower extremity swelling  Data Reviewed: reviewed   CBC    Component Value Date/Time   WBC 11.3 (H) 07/29/2023 0919   RBC 3.80 (L) 07/29/2023 0919  HGB 11.7 (L) 07/29/2023 0919   HGB 14.4 12/19/2022 1039   HCT 36.4 (L) 07/29/2023 0919   HCT 43.8 12/19/2022 1039   PLT 186 07/29/2023 0919   PLT 200 12/19/2022 1039   MCV 95.8 07/29/2023 0919   MCV 96 12/19/2022 1039   MCV 93 02/27/2012 1406   MCH 30.8 07/29/2023 0919   MCHC 32.1 07/29/2023 0919   RDW 13.1 07/29/2023 0919   RDW 11.8 12/19/2022 1039   RDW 12.9 02/27/2012  1406   LYMPHSABS 0.9 07/29/2023 0919   LYMPHSABS 2.1 02/27/2012 1406   MONOABS 0.6 07/29/2023 0919   MONOABS 0.6 02/27/2012 1406   EOSABS 0.0 07/29/2023 0919   EOSABS 0.4 02/27/2012 1406   BASOSABS 0.0 07/29/2023 0919   BASOSABS 0.1 02/27/2012 1406      Latest Ref Rng & Units 07/28/2023    4:06 AM 07/11/2023    5:06 AM 07/10/2023    4:59 AM  CMP  Glucose 70 - 99 mg/dL 161   88   BUN 8 - 23 mg/dL 15   22   Creatinine 0.96 - 1.24 mg/dL 0.45  4.09  8.11   Sodium 135 - 145 mmol/L 141   143   Potassium 3.5 - 5.1 mmol/L 3.5   3.6   Chloride 98 - 111 mmol/L 105   103   CO2 22 - 32 mmol/L 28   34   Calcium 8.9 - 10.3 mg/dL 8.2   8.0     Scheduled Meds:  aspirin EC  81 mg Oral Daily   atorvastatin  80 mg Oral Daily   heparin  5,000 Units Subcutaneous Q8H   sodium chloride flush  3 mL Intravenous Q12H   sodium chloride flush  3 mL Intravenous Q12H   spironolactone  12.5 mg Oral Daily   umeclidinium bromide  1 puff Inhalation Daily   Continuous Infusions:  [COMPLETED]  ceFAZolin (ANCEF) IV      Time  33  Verlie Glisson, MD  [facility]

## 2023-07-29 NOTE — TOC CAGE-AID Note (Signed)
 Transition of Care [facility]) - CAGE-AID Screening  Patient Details  Name: [name] MRN: [id number] Date of Birth: 04-Nov-1945  Clinical Narrative:  Patient denies any current alcohol or drug use, substance abuse resources not provided at this time.  CAGE-AID Screening:   Have You Ever Felt You Ought to Cut Down on Your Drinking or Drug Use?: No Have People Annoyed You By Critizing Your Drinking Or Drug Use?: No Have You Felt Bad Or Guilty About Your Drinking Or Drug Use?: No Have You Ever Had a Drink or Used Drugs First Thing In The Morning to Steady Your Nerves or to Get Rid of a Hangover?: No CAGE-AID Score: 0  Substance Abuse Education Offered: No

## 2023-07-29 NOTE — Progress Notes (Signed)
 Orthopaedic Surgery Progress Note  Mild hip pain today. No other questions. Oriented x2.  Exam: Left Lower Extremity: Dressing c/d/i + ankle dorsiflexion/plantarflexion/EHL SILT SP/DP/T Foot wwp   Assessment: 78 y.o. male 1 Day Post-Op status post left hip hemiarthroplasty for femoral neck hip fracture  Plan: Full weightbearing as tolerated using a walker.  No hip precautions are required Maintain Aquacel dressing until first postop appointment.  Okay to change with regular dry gauze if needed. OK to resume DVT chemoppx or anticoagulation on post op day 1; recommend 4 weeks of chemoppx with ASA 81 BID if no other AC is in place Perioperative ancef for 24 PT beginning on POD#1 Discharge instructions placed in Epic

## 2023-07-30 ENCOUNTER — Encounter (HOSPITAL_COMMUNITY): Payer: Self-pay | Admitting: Sports Medicine

## 2023-07-30 DIAGNOSIS — S72002A Fracture of unspecified part of neck of left femur, initial encounter for closed fracture: Secondary | ICD-10-CM | POA: Diagnosis not present

## 2023-07-30 LAB — CBC WITH DIFFERENTIAL/PLATELET
Abs Immature Granulocytes: 0.05 10*3/uL (ref 0.00–0.07)
Basophils Absolute: 0 10*3/uL (ref 0.0–0.1)
Basophils Relative: 0 %
Eosinophils Absolute: 0.3 10*3/uL (ref 0.0–0.5)
Eosinophils Relative: 3 %
HCT: 35.9 % — ABNORMAL LOW (ref 39.0–52.0)
Hemoglobin: 11.7 g/dL — ABNORMAL LOW (ref 13.0–17.0)
Immature Granulocytes: 1 %
Lymphocytes Relative: 11 %
Lymphs Abs: 1.1 10*3/uL (ref 0.7–4.0)
MCH: 30.9 pg (ref 26.0–34.0)
MCHC: 32.6 g/dL (ref 30.0–36.0)
MCV: 94.7 fL (ref 80.0–100.0)
Monocytes Absolute: 0.7 10*3/uL (ref 0.1–1.0)
Monocytes Relative: 6 %
Neutro Abs: 8.3 10*3/uL — ABNORMAL HIGH (ref 1.7–7.7)
Neutrophils Relative %: 79 %
Platelets: 162 10*3/uL (ref 150–400)
RBC: 3.79 MIL/uL — ABNORMAL LOW (ref 4.22–5.81)
RDW: 13.1 % (ref 11.5–15.5)
WBC: 10.4 10*3/uL (ref 4.0–10.5)
nRBC: 0 % (ref 0.0–0.2)

## 2023-07-30 LAB — BASIC METABOLIC PANEL WITH GFR
Anion gap: 7 (ref 5–15)
BUN: 26 mg/dL — ABNORMAL HIGH (ref 8–23)
CO2: 28 mmol/L (ref 22–32)
Calcium: 8.3 mg/dL — ABNORMAL LOW (ref 8.9–10.3)
Chloride: 104 mmol/L (ref 98–111)
Creatinine, Ser: 1.49 mg/dL — ABNORMAL HIGH (ref 0.61–1.24)
GFR, Estimated: 47 mL/min — ABNORMAL LOW (ref >60)
Glucose, Bld: 116 mg/dL — ABNORMAL HIGH (ref 70–99)
Potassium: 3.9 mmol/L (ref 3.5–5.1)
Sodium: 139 mmol/L (ref 135–145)

## 2023-07-30 MED ORDER — HYDROMORPHONE HCL 1 MG/ML IJ SOLN: 0.5000 mg | INTRAMUSCULAR | Status: DC | PRN

## 2023-07-30 NOTE — Progress Notes (Signed)
 [facility] ROUNDING NOTE [facility] ZOX:[id number]  DOB: 1945-06-07  DOA: 07/28/2023  PCP: Danella Penton, MD  07/30/2023,4:06 PM  LOS: 2 days    Code Status: Full code   from: Skilled current Dispo: Likely skilled   78 year old current [place] [facility] resident HTN HLD  Gold 2 COPD with chronic hypoxia still smoker HFpEF 45% AAA status post endovascular repair with aorto bi-iliac stent grafting Underlying CAD with stenting and balloon angioplasty 12/2010 Reported CVA previously Subdural hemorrhage status post hospitalization 11 7-11 04/05/2021 Incarcerated hernia status postrepair 03/2021   Recurrent prior admissions for HF R EF exacerbations   Recent admission for severe sepsis pulmonary source superimposed on chronic hypoxic failure and was discharged to skilled-blood pressures were relatively low GDMT was transitioned over and placed on low-dose midodrine   Recent ED visit for 325 for fall discharged back to facility He returned for 1225 for fall and found to have left hip pelvic pain   CXR mild cardiomegaly left femoral neck fracture with varus angulation noted portable pelvis confirmed this CT cervical spine no evidence for intracranial cervical spine injury CT head no evidence of intracranial bleed   Sodium 141 potassium 3.5 BUN/creatinine 15/1.4 above baseline (22/1.1) WBC 15 hemoglobin 12 platelet 179    Plan  Acute hip fracture left-sided Postop management per Ortho-postop prophylaxis aspirin 81 twice daily, Aquacel versus dry gauze dressing Pain control Oxy IR 5 every 4 as needed moderate pain, Dilaudid 0.5--he was quite sleepy when I saw him so I cut back the Dilaudid to only 0.5 every 3 Awaiting therapy evaluation  Blood loss anemia expected from surgery Transfused 1 unit PRBC IntraOp-hemoglobin has remained stable Aspirin 81 twice daily for 3 to 4 weeks postop  HFrEF EF 45% no acute component Perioperative fluids have been discontinued can continue regular diet GDMT  limited because of prior to admission hypotension discussed laxation discontinued Aldactone 12.5 As outpatient consider resumption Farxiga Lasix also stopped Some constitutional hypotension midodrine has been held for now  AAA status post endovascular repair with by iliac stent grafting in the past Underlying CAD with stenting 2012 Reported CVA Continue atorvastatin 80 daily, aspirin as above  COPD Gold stage II chronic hypoxia on 2 L--seems to be on 4 L here Continue oxygen Will try to de-escalate and wean hide inform nursing of the same  Spiriva 18 mcg albuterol as needed as needed   DVT prophylaxis: Heparin  Status is: Inpatient Remains inpatient appropriate because:   Likely require skilled   Subjective:  Quite sleepy so cut back Dilaudid IV Overall has not eaten lunch-a little bit less coherent than prior I was informed by nursing that he took his Dilaudid at around 10 AM  Objective + exam Vitals:   07/30/23 0300 07/30/23 0505 07/30/23 0844 07/30/23 1529  BP:  118/66 (!) 151/85 (!) 110/55  Pulse:  92 (!) 103 (!) 48  Resp: 18 20 17 18   Temp:  98.3 F (36.8 C) 97.6 F (36.4 C) 98 F (36.7 C)  TempSrc:  Oral Oral Oral  SpO2:   90% 93%  Weight:      Height:       Filed Weights   07/28/23 1258  Weight: 76.5 kg    Examination:  Awakens from sleep a little bit groggy Chest is anteriorly clear no rales rhonchi Abdomen soft no rebound Wound examined looks dry dressing on stain No lower extremity edema ROM limited by pain   Data Reviewed: reviewed   CBC  Component Value Date/Time   WBC 10.4 07/30/2023 0252   RBC 3.79 (L) 07/30/2023 0252   HGB 11.7 (L) 07/30/2023 0252   HGB 14.4 12/19/2022 1039   HCT 35.9 (L) 07/30/2023 0252   HCT 43.8 12/19/2022 1039   PLT 162 07/30/2023 0252   PLT 200 12/19/2022 1039   MCV 94.7 07/30/2023 0252   MCV 96 12/19/2022 1039   MCV 93 02/27/2012 1406   MCH 30.9 07/30/2023 0252   MCHC 32.6 07/30/2023 0252   RDW 13.1  07/30/2023 0252   RDW 11.8 12/19/2022 1039   RDW 12.9 02/27/2012 1406   LYMPHSABS 1.1 07/30/2023 0252   LYMPHSABS 2.1 02/27/2012 1406   MONOABS 0.7 07/30/2023 0252   MONOABS 0.6 02/27/2012 1406   EOSABS 0.3 07/30/2023 0252   EOSABS 0.4 02/27/2012 1406   BASOSABS 0.0 07/30/2023 0252   BASOSABS 0.1 02/27/2012 1406      Latest Ref Rng & Units 07/30/2023    2:52 AM 07/29/2023    9:19 AM 07/28/2023    4:06 AM  CMP  Glucose 70 - 99 mg/dL 914  782  956   BUN 8 - 23 mg/dL 26  19  15    Creatinine 0.61 - 1.24 mg/dL 2.13  0.86  5.78   Sodium 135 - 145 mmol/L 139  139  141   Potassium 3.5 - 5.1 mmol/L 3.9  4.2  3.5   Chloride 98 - 111 mmol/L 104  100  105   CO2 22 - 32 mmol/L 28  30  28    Calcium 8.9 - 10.3 mg/dL 8.3  8.6  8.2   Total Protein 6.5 - 8.1 g/dL  6.1    Total Bilirubin 0.0 - 1.2 mg/dL  1.0    Alkaline Phos 38 - 126 U/L  65    AST 15 - 41 U/L  28    ALT 0 - 44 U/L  16      Scheduled Meds:  aspirin EC  81 mg Oral BID   atorvastatin  80 mg Oral Daily   guaiFENesin  600 mg Oral BID   heparin  5,000 Units Subcutaneous Q8H   sodium chloride flush  3 mL Intravenous Q12H   umeclidinium bromide  1 puff Inhalation Daily   Continuous Infusions:    Time  33  Jai-Gurmukh Mitul Hallowell, MD  [facility]

## 2023-07-30 NOTE — Evaluation (Signed)
 Physical Therapy Evaluation Patient Details Name: [name] MRN: [id number] DOB: 10/30/1945 Today's Date: 07/30/2023  History of Present Illness  Pt is a 78 yo male that presents from SNF after fall and L hip hx, s/p surgical repair, WBAT; recent admission in March,  worked up for sepsis secondary to pneumonia, dc'd to SNF after that. PMH of COPD stage II, anxiety, stroke, neuromuscular disorder,depression, HTN, prostate cancer s/p seed implantation, HLD, lumbar disc disease, CAD, AAA , intraparenchymal hemorrhage in 02/2021, , nephrolithiasis, GERD, tobacco use and chronic ischemic heart failure.  Clinical Impression   Pt admitted with above diagnosis. Comes from a SNF, where he was undergoing rehab fater an admission with sepsis in late March; Prior to taht admission, pt was able to manage living by himself, with nightly visits from his stepsons; Presents to PT with decr arousal, eyes were closed the entire session, except for a brief moment when he was initially brought to EOB; Grimaces with attemtps at ROM of L hip; needed +2 total assist with getting up to EOB and with standing trials (to sara stedy) x2; Patient will benefit from continued inpatient follow up therapy, <3 hours/day, recommend return to continue his rehab when medically ready;  Pt currently with functional limitations due to the deficits listed below (see PT Problem List). Pt will benefit from skilled PT to increase their independence and safety with mobility to allow discharge to the venue listed below.           If plan is discharge home, recommend the following: Two people to help with walking and/or transfers;Two people to help with bathing/dressing/bathroom   Can travel by private vehicle   No    Equipment Recommendations None recommended by PT;Other (comment) (Or TBD back at [facility])  Recommendations for Other Services       Functional Status Assessment Patient has had a recent decline in their functional status  and/or demonstrates limited ability to make significant improvements in function in a reasonable and predictable amount of time     Precautions / Restrictions Precautions Precautions: Fall Recall of Precautions/Restrictions: Impaired Restrictions LLE Weight Bearing Per Provider Order: Weight bearing as tolerated      Mobility  Bed Mobility Overal bed mobility: Needs Assistance Bed Mobility: Supine to Sit, Sit to Supine     Supine to sit: +2 for physical assistance, Total assist Sit to supine: +2 for physical assistance, Max assist   General bed mobility comments: Needed physical assist for all aspects of bed mobility    Transfers Overall transfer level: Needs assistance Equipment used: Ambulation equipment used Transfers: Sit to/from Stand Sit to Stand: +2 physical assistance, Max assist           General transfer comment: Unable to get to fully standing with +2 Max assist Transfer via Lift Equipment: Stedy  Ambulation/Gait                  Stairs            Wheelchair Mobility     Tilt Bed    Modified Rankin (Stroke Patients Only)       Balance     Sitting balance-Leahy Scale: Poor       Standing balance-Leahy Scale: Zero                               Pertinent Vitals/Pain Pain Assessment Pain Assessment: Faces Faces Pain Scale: Hurts little more Pain  Location: L hip Pain Descriptors / Indicators: Grimacing, Guarding Pain Intervention(s): Monitored during session    Home Living Family/patient expects to be discharged to:: Skilled nursing facility Living Arrangements: Alone Available Help at Discharge: Family;Available PRN/intermittently (two sons come in the evenings until 9pm) Type of Home: Mobile home Home Access: Stairs to enter;Ramped entrance Entrance Stairs-Rails: Can reach both Entrance Stairs-Number of Steps: 4   Home Layout: One level Home Equipment: Agricultural consultant (2 wheels);Cane - single  point Additional Comments: step sons bring food and drive to appointments if needed, checks on him nightly    Prior Function Prior Level of Function : Independent/Modified Independent             Mobility Comments: using rolling walker, no falls reported ADLs Comments: Per chart review, as of admission in March, Pt used RW for functional mobility and sons bring meals and assist with IADLs as needed     Extremity/Trunk Assessment   Upper Extremity Assessment Upper Extremity Assessment: Difficult to assess due to impaired cognition    Lower Extremity Assessment Lower Extremity Assessment: LLE deficits/detail;Generalized weakness;RLE deficits/detail RLE Deficits / Details: Temnding to adduct RLE in sitting EOB LLE Deficits / Details: Noteworthy grimace during PROM LLE    Cervical / Trunk Assessment Cervical / Trunk Assessment: Other exceptions Cervical / Trunk Exceptions: Forward head posture with rounded shoulders  Communication   Communication Communication: Impaired Factors Affecting Communication: Other (comment) (difficult to discern due to decr arousal)    Cognition Arousal: Lethargic (nearly obtunded) Behavior During Therapy: Flat affect   PT - Cognitive impairments: Difficult to assess Difficult to assess due to: Level of arousal                     PT - Cognition Comments: Answered a few yes/no questions; Eyes flickered open once during session, when he was initiatlly helped up to sittin gEOB Following commands: Impaired Following commands impaired: Follows one step commands inconsistently (More often than not he did not follow commands during PT session)     Cueing Cueing Techniques: Verbal cues, Tactile cues     General Comments General comments (skin integrity, edema, etc.): session conducted on 4 L supplemental O2    Exercises     Assessment/Plan    PT Assessment Patient needs continued PT services  PT Problem List Decreased  strength;Decreased range of motion;Decreased activity tolerance;Decreased balance;Decreased mobility;Decreased knowledge of use of DME;Decreased safety awareness;Decreased knowledge of precautions       PT Treatment Interventions DME instruction;Gait training;Functional mobility training;Therapeutic activities;Therapeutic exercise;Balance training;Patient/family education    PT Goals (Current goals can be found in the Care Plan section)  Acute Rehab PT Goals Patient Stated Goal: unable to state PT Goal Formulation: Patient unable to participate in goal setting Time For Goal Achievement: 08/13/23 Potential to Achieve Goals: Fair    Frequency Min 2X/week     Co-evaluation               AM-PAC PT "6 Clicks" Mobility  Outcome Measure Help needed turning from your back to your side while in a flat bed without using bedrails?: Total Help needed moving from lying on your back to sitting on the side of a flat bed without using bedrails?: Total Help needed moving to and from a bed to a chair (including a wheelchair)?: Total Help needed standing up from a chair using your arms (e.g., wheelchair or bedside chair)?: Total Help needed to walk in hospital room?: Total Help needed climbing  3-5 steps with a railing? : Total 6 Click Score: 6    End of Session Equipment Utilized During Treatment: Oxygen Activity Tolerance: Patient limited by pain;Patient limited by lethargy Patient left: in bed;with call bell/phone within reach;with bed alarm set (bed in semi-chair position) Nurse Communication: Mobility status PT Visit Diagnosis: Other abnormalities of gait and mobility (R26.89);Muscle weakness (generalized) (M62.81);History of falling (Z91.81);Pain;Other symptoms and signs involving the nervous system (R29.898) Pain - Right/Left: Left Pain - part of body: Hip    Time: 1610-9604 PT Time Calculation (min) (ACUTE ONLY): 21 min   Charges:   PT Evaluation $PT Eval Moderate Complexity: 1  Mod   PT General Charges $$ ACUTE PT VISIT: 1 Visit         Darcus Eastern, PT  Acute Rehabilitation Services Office [phone] Secure Chat welcomed   Marcial Setting 07/30/2023, 4:40 PM

## 2023-07-31 DIAGNOSIS — S72002A Fracture of unspecified part of neck of left femur, initial encounter for closed fracture: Secondary | ICD-10-CM | POA: Diagnosis not present

## 2023-07-31 LAB — CBC WITH DIFFERENTIAL/PLATELET
Abs Immature Granulocytes: 0.04 10*3/uL (ref 0.00–0.07)
Basophils Absolute: 0 10*3/uL (ref 0.0–0.1)
Basophils Relative: 0 %
Eosinophils Absolute: 0.2 10*3/uL (ref 0.0–0.5)
Eosinophils Relative: 2 %
HCT: 36.1 % — ABNORMAL LOW (ref 39.0–52.0)
Hemoglobin: 11.8 g/dL — ABNORMAL LOW (ref 13.0–17.0)
Immature Granulocytes: 0 %
Lymphocytes Relative: 10 %
Lymphs Abs: 0.9 10*3/uL (ref 0.7–4.0)
MCH: 30.9 pg (ref 26.0–34.0)
MCHC: 32.7 g/dL (ref 30.0–36.0)
MCV: 94.5 fL (ref 80.0–100.0)
Monocytes Absolute: 0.6 10*3/uL (ref 0.1–1.0)
Monocytes Relative: 7 %
Neutro Abs: 7.6 10*3/uL (ref 1.7–7.7)
Neutrophils Relative %: 81 %
Platelets: 159 10*3/uL (ref 150–400)
RBC: 3.82 MIL/uL — ABNORMAL LOW (ref 4.22–5.81)
RDW: 13 % (ref 11.5–15.5)
WBC: 9.4 10*3/uL (ref 4.0–10.5)
nRBC: 0 % (ref 0.0–0.2)

## 2023-07-31 LAB — BASIC METABOLIC PANEL WITH GFR
Anion gap: 8 (ref 5–15)
BUN: 23 mg/dL (ref 8–23)
CO2: 28 mmol/L (ref 22–32)
Calcium: 8.4 mg/dL — ABNORMAL LOW (ref 8.9–10.3)
Chloride: 105 mmol/L (ref 98–111)
Creatinine, Ser: 1.15 mg/dL (ref 0.61–1.24)
GFR, Estimated: >60 mL/min (ref >60)
Glucose, Bld: 91 mg/dL (ref 70–99)
Potassium: 4 mmol/L (ref 3.5–5.1)
Sodium: 141 mmol/L (ref 135–145)

## 2023-07-31 MED ORDER — POLYETHYLENE GLYCOL 3350 17 G PO PACK
17.0000 g | PACK | Freq: Every day | ORAL | Status: DC
Start: 2023-07-31 — End: 2023-08-01
  Administered 2023-07-31 – 2023-08-01 (×2): 17 g via ORAL
  Filled 2023-07-31 (×2): qty 1

## 2023-07-31 MED ORDER — OXYCODONE HCL 5 MG PO TABS
5.0000 mg | ORAL_TABLET | ORAL | Status: DC | PRN
  Administered 2023-07-31: 10 mg via ORAL
  Filled 2023-07-31: qty 2

## 2023-07-31 MED ORDER — METHOCARBAMOL 500 MG PO TABS
500.0000 mg | ORAL_TABLET | Freq: Three times a day (TID) | ORAL | Status: DC
  Administered 2023-07-31 – 2023-08-01 (×4): 500 mg via ORAL
  Filled 2023-07-31 (×4): qty 1

## 2023-07-31 MED ORDER — OXYCODONE HCL 5 MG PO TABS
5.0000 mg | ORAL_TABLET | Freq: Four times a day (QID) | ORAL | Status: DC | PRN
  Administered 2023-07-31 – 2023-08-01 (×2): 5 mg via ORAL
  Filled 2023-07-31 (×2): qty 1

## 2023-07-31 MED ORDER — OXYCODONE HCL 5 MG PO TABS: 5.0000 mg | ORAL_TABLET | ORAL | Status: DC | PRN

## 2023-07-31 MED ORDER — ACETAMINOPHEN 500 MG PO TABS
1000.0000 mg | ORAL_TABLET | Freq: Three times a day (TID) | ORAL | Status: DC
  Administered 2023-07-31 – 2023-08-01 (×3): 1000 mg via ORAL
  Filled 2023-07-31 (×3): qty 2

## 2023-07-31 MED ORDER — SENNOSIDES-DOCUSATE SODIUM 8.6-50 MG PO TABS
1.0000 | ORAL_TABLET | Freq: Every day | ORAL | Status: DC
  Administered 2023-07-31: 1 via ORAL
  Filled 2023-07-31: qty 1

## 2023-07-31 NOTE — NC FL2 (Signed)
 [place] MEDICAID FL2 LEVEL OF CARE FORM     IDENTIFICATION  Patient Name: [name] Birthdate: 1945/06/07 Sex: male Admission Date (Current Location): 07/28/2023  [facility] and [place] Number:  Producer, television/film/video and Address:  [facility], [address], [place], [place] [address]      Provider Number: [id number]  Attending Physician Name and Address:  Rhetta Mura, MD  Relative Name and Phone Number:  Geni Bers [phone]    Current Level of Care: Hospital Recommended Level of Care: Skilled Nursing Facility Prior Approval Number:    Date Approved/Denied:   PASRR Number: [id number]  Discharge Plan: SNF    Current Diagnoses: Patient Active Problem List   Diagnosis Date Noted   Fracture of femoral neck, left (HCC) 07/28/2023   History of CAD (coronary artery disease) 07/28/2023   History of AAA (abdominal aortic aneurysm) repair 07/28/2023   Sepsis due to pneumonia (HCC) 07/04/2023   Thrombocytopenia (HCC) 01/17/2023   Acute on chronic hypoxic respiratory failure (HCC) 12/13/2022   COPD (chronic obstructive pulmonary disease) (HCC) 12/12/2022   Acute hypoxemic respiratory failure (HCC) 12/12/2022   Acute on chronic combined systolic and diastolic CHF (congestive heart failure) (HCC) 04/24/2021   Anemia 04/24/2021   Acute on chronic respiratory failure with hypoxia (HCC) 04/24/2021   Incarcerated inguinal hernia 03/18/2021   Chronic systolic CHF (congestive heart failure) (HCC)    Intraparenchymal hemorrhage of brain (HCC) 02/26/2021   SDH (subdural hematoma) (HCC) 02/21/2021   Fall at home, initial encounter 02/21/2021   History of stroke 02/21/2021   Elevated troponin 02/21/2021   Other spondylosis with radiculopathy, lumbar region 09/04/2018   Centrilobular emphysema (HCC) 07/19/2018   Dilated cardiomyopathy (HCC) 07/19/2018   Coronary artery disease of native artery of native heart with stable angina pectoris  (HCC) 01/20/2018   AAA (abdominal aortic aneurysm) without rupture (HCC) 10/30/2017   COPD exacerbation (HCC) 04/03/2015   Barrett's esophagus 05/20/2014   Constipation 05/20/2014   Arthritis of hand 05/07/2014   Pulmonary nodule 11/06/2011   Chronic hypoxic respiratory failure (HCC) 04/24/2011   Tobacco abuse 02/12/2011   CAD, NATIVE VESSEL/HLD 10/18/2008   COLONIC POLYPS 08/05/2008   Hyperlipidemia 08/05/2008   Essential hypertension 08/05/2008   GERD 08/05/2008    Orientation RESPIRATION BLADDER Height & Weight     Self, Time, Situation, Place  O2 External catheter, Incontinent Weight: 168 lb 10.4 oz (76.5 kg) Height:  6' 0.01" (182.9 cm)  BEHAVIORAL SYMPTOMS/MOOD NEUROLOGICAL BOWEL NUTRITION STATUS      Continent Diet (see discharge summary)  AMBULATORY STATUS COMMUNICATION OF NEEDS Skin   Total Care Verbally Surgical wounds                       Personal Care Assistance Level of Assistance  Bathing, Feeding, Dressing Bathing Assistance: Maximum assistance Feeding assistance: Limited assistance Dressing Assistance: Maximum assistance     Functional Limitations Info  Sight, Hearing, Speech Sight Info: Adequate Hearing Info: Impaired Speech Info: Adequate    SPECIAL CARE FACTORS FREQUENCY  PT (By licensed PT), OT (By licensed OT)     PT Frequency: 5x week OT Frequency: 5x week            Contractures Contractures Info: Not present    Additional Factors Info  Code Status, Allergies Code Status Info: full Allergies Info: lyrica           Current Medications (07/31/2023):  This is the current hospital active medication  list Current Facility-Administered Medications  Medication Dose Route Frequency Provider Last Rate Last Admin   acetaminophen (TYLENOL) tablet 650 mg  650 mg Oral Q6H PRN Marilee Showers, MD   650 mg at 07/29/23 1348   aspirin EC tablet 81 mg  81 mg Oral BID Samtani, Jai-Gurmukh, MD   81 mg at 07/31/23 0846   atorvastatin (LIPITOR)  tablet 80 mg  80 mg Oral Daily Marilee Showers, MD   80 mg at 07/31/23 0847   guaiFENesin (MUCINEX) 12 hr tablet 600 mg  600 mg Oral BID Samtani, Jai-Gurmukh, MD   600 mg at 07/31/23 0847   heparin injection 5,000 Units  5,000 Units Subcutaneous Q8H Marilee Showers, MD   5,000 Units at 07/31/23 0510   ipratropium-albuterol (DUONEB) 0.5-2.5 (3) MG/3ML nebulizer solution 3 mL  3 mL Nebulization Q4H PRN Marilee Showers, MD   3 mL at 07/29/23 1338   methocarbamol (ROBAXIN) tablet 500 mg  500 mg Oral TID Samtani, Jai-Gurmukh, MD   500 mg at 07/31/23 0848   oxyCODONE (Oxy IR/ROXICODONE) immediate release tablet 5-10 mg  5-10 mg Oral Q4H PRN Samtani, Jai-Gurmukh, MD   10 mg at 07/31/23 0847   sodium chloride flush (NS) 0.9 % injection 3 mL  3 mL Intravenous Q12H Marilee Showers, MD   3 mL at 07/31/23 0848   umeclidinium bromide (INCRUSE ELLIPTA) 62.5 MCG/ACT 1 puff  1 puff Inhalation Daily Marilee Showers, MD   1 puff at 07/31/23 0848     Discharge Medications: Please see discharge summary for a list of discharge medications.  Relevant Imaging Results:  Relevant Lab Results:   Additional Information SSN: [id number]  Gloria Lares Merrie Abed, LCSW

## 2023-07-31 NOTE — Progress Notes (Signed)
 TRH ROUNDING NOTE [name] ZOX:[id number]  DOB: 03/18/1946  DOA: 07/28/2023  PCP: Danella Penton, MD  07/31/2023,10:16 AM  LOS: 3 days    Code Status: Full code   from: Skilled current Dispo: Likely skilled   78 year old current [place] [facility] resident HTN HLD  Gold 2 COPD with chronic hypoxia still smoker HFpEF 45% AAA status post endovascular repair with aorto bi-iliac stent grafting Underlying CAD with stenting and balloon angioplasty 12/2010 Reported CVA previously Subdural hemorrhage status post hospitalization 11 7-11 04/05/2021 Incarcerated hernia status postrepair 03/2021   Recurrent prior admissions for HF R EF exacerbations   Recent admission for severe sepsis pulmonary source superimposed on chronic hypoxic failure and was discharged to skilled-blood pressures were relatively low GDMT was transitioned over and placed on low-dose midodrine   Recent ED visit for 325 for fall discharged back to facility He returned for 1225 for fall and found to have left hip pelvic pain   CXR mild cardiomegaly left femoral neck fracture with varus angulation noted portable pelvis confirmed this CT cervical spine no evidence for intracranial cervical spine injury CT head no evidence of intracranial bleed   Sodium 141 potassium 3.5 BUN/creatinine 15/1.4 above baseline (22/1.1) WBC 15 hemoglobin 12 platelet 179    Plan  Acute hip fracture left-sided Postop management per Ortho-postop prophylaxis aspirin 81 twice daily, Aquacel versus dry gauze dressing Patient becomes quite sleepy on IV opiates-meds adjusted-discontinued Dilaudid-Oxy IR 5-10 every 4 as needed, added Robaxin 500 3 times daily-monitor for improvement neck step would be to add scheduled low-dose NSAID  Blood loss anemia expected from surgery Transfused 1 unit PRBC IntraOp-hemoglobin has remained stable-stop further checking Aspirin 81 twice daily for 3 to 4 weeks postop or as per orthopedics  HFrEF EF 45% no acute  component Perioperative fluids have been discontinued can continue regular diet GDMT limited to due to hypotension-of Aldactone 12.5 Lasix 20 as well as Farxiga 10 Constitutional hypotension is improved-would resume these meds as an outpatient with repeat labs  AAA status post endovascular repair with by iliac stent grafting in the past Underlying CAD with stenting 2012 Reported CVA Continue atorvastatin 80 daily, aspirin as above  COPD Gold stage II chronic hypoxia on 4 L for the past year No overt wheeze-at discharge resume albuterol and Spiriva 18 mcg/DuoNeb has been resumed here Continue oxygen  DVT prophylaxis: Heparin  Status is: Inpatient Remains inpatient appropriate because:   Likely require skilled-await authorization   Subjective:  In some moderate pain No fever no chills No nausea no vomiting   Objective + exam Vitals:   07/30/23 1529 07/30/23 2044 07/31/23 0440 07/31/23 0737  BP: (!) 110/55 129/73 (!) 152/88 (!) 154/78  Pulse: (!) 48 (!) 47 87 82  Resp: 18 18 17    Temp: 98 F (36.7 C) 98.2 F (36.8 C)  98.2 F (36.8 C)  TempSrc: Oral     SpO2: 93% 93% 93% 91%  Weight:      Height:       Filed Weights   07/28/23 1258  Weight: 76.5 kg    Examination:  No distress more awake coherent some pain EOMI NCAT Chest clear no wheeze rales rhonchi Abdomen nondistended no rebound no HSM Wound bed looks clean with some dark staining blood on the inside of bandage It is tender    Data Reviewed: reviewed   CBC    Component Value Date/Time   WBC 9.4 07/31/2023 0440   RBC 3.82 (L) 07/31/2023 0440  HGB 11.8 (L) 07/31/2023 0440   HGB 14.4 12/19/2022 1039   HCT 36.1 (L) 07/31/2023 0440   HCT 43.8 12/19/2022 1039   PLT 159 07/31/2023 0440   PLT 200 12/19/2022 1039   MCV 94.5 07/31/2023 0440   MCV 96 12/19/2022 1039   MCV 93 02/27/2012 1406   MCH 30.9 07/31/2023 0440   MCHC 32.7 07/31/2023 0440   RDW 13.0 07/31/2023 0440   RDW 11.8 12/19/2022 1039    RDW 12.9 02/27/2012 1406   LYMPHSABS 0.9 07/31/2023 0440   LYMPHSABS 2.1 02/27/2012 1406   MONOABS 0.6 07/31/2023 0440   MONOABS 0.6 02/27/2012 1406   EOSABS 0.2 07/31/2023 0440   EOSABS 0.4 02/27/2012 1406   BASOSABS 0.0 07/31/2023 0440   BASOSABS 0.1 02/27/2012 1406      Latest Ref Rng & Units 07/31/2023    4:40 AM 07/30/2023    2:52 AM 07/29/2023    9:19 AM  CMP  Glucose 70 - 99 mg/dL 91  161  096   BUN 8 - 23 mg/dL 23  26  19    Creatinine 0.61 - 1.24 mg/dL 0.45  4.09  8.11   Sodium 135 - 145 mmol/L 141  139  139   Potassium 3.5 - 5.1 mmol/L 4.0  3.9  4.2   Chloride 98 - 111 mmol/L 105  104  100   CO2 22 - 32 mmol/L 28  28  30    Calcium 8.9 - 10.3 mg/dL 8.4  8.3  8.6   Total Protein 6.5 - 8.1 g/dL   6.1   Total Bilirubin 0.0 - 1.2 mg/dL   1.0   Alkaline Phos 38 - 126 U/L   65   AST 15 - 41 U/L   28   ALT 0 - 44 U/L   16     Scheduled Meds:  aspirin EC  81 mg Oral BID   atorvastatin  80 mg Oral Daily   guaiFENesin  600 mg Oral BID   heparin  5,000 Units Subcutaneous Q8H   methocarbamol  500 mg Oral TID   sodium chloride flush  3 mL Intravenous Q12H   umeclidinium bromide  1 puff Inhalation Daily   Continuous Infusions:  Time  33  Verlie Glisson, MD  [facility]

## 2023-07-31 NOTE — Plan of Care (Signed)

## 2023-07-31 NOTE — TOC Initial Note (Addendum)
 Transition of Care [facility]) - Initial/Assessment Note    Patient Details  Name: [name] MRN: [id number] Date of Birth: 1945/05/09  [facility]) CM/SW Contact:    Elspeth Hals, LCSW Phone Number: 07/31/2023, 11:05 AM  Clinical Narrative:      Pt listed as oriented x4 but eyes mostly closed when CSW attempted to speak with him, not much response.  CSW spoke with son Adolm Ahumada  by phone who provides all information.  Pt was just discharged from [facility] several days ago after STR.  Pt normally home alone, does have HH aide through [facility].  Both sons also come by daily to support pt.  Discussed PT recommendation for SNF and Shawn agreeable, does want to return to [place] if possible.  Discussed copay days--Shawn thinks he was at [facility] around 12 days.  Permission given to send referral in hub.  Referral sent to [facility], CSW reached out to [place] to review.       1120: [facility] does accept.  SNF auth request submitted to [employer].         Expected Discharge Plan: Skilled Nursing Facility Barriers to Discharge: Continued Medical Work up, SNF Pending bed offer   Patient Goals and CMS Choice     Choice offered to / list presented to : Adult Children (son Adolm Ahumada)      Expected Discharge Plan and Services In-house Referral: Clinical Social Work   Post Acute Care Choice: Skilled Nursing Facility Living arrangements for the past 2 months: Single Family Home                                      Prior Living Arrangements/Services Living arrangements for the past 2 months: Single Family Home Lives with:: Self Patient language and need for interpreter reviewed:: Yes        Need for Family Participation in Patient Care: Yes (Comment) Care giver support system in place?: Yes (comment) Current home services: Homehealth aide Jeanelle Milch) Criminal Activity/Legal Involvement Pertinent to Current Situation/Hospitalization: No - Comment as  needed  Activities of Daily Living   ADL Screening (condition at time of admission) Independently performs ADLs?: Yes (appropriate for developmental age) Is the patient deaf or have difficulty hearing?: No Does the patient have difficulty seeing, even when wearing glasses/contacts?: No Does the patient have difficulty concentrating, remembering, or making decisions?: No  Permission Sought/Granted                  Emotional Assessment Appearance:: Appears stated age Attitude/Demeanor/Rapport: Unable to Assess, Lethargic Affect (typically observed): Unable to Assess Orientation: : Oriented to Self, Oriented to Place, Oriented to  Time, Oriented to Situation      Admission diagnosis:  Fracture of femoral neck, left (HCC) [S72.002A] Closed fracture of left hip, initial encounter [facility]) [S72.002A] Patient Active Problem List   Diagnosis Date Noted   Fracture of femoral neck, left (HCC) 07/28/2023   History of CAD (coronary artery disease) 07/28/2023   History of AAA (abdominal aortic aneurysm) repair 07/28/2023   Sepsis due to pneumonia (HCC) 07/04/2023   Thrombocytopenia (HCC) 01/17/2023   Acute on chronic hypoxic respiratory failure (HCC) 12/13/2022   COPD (chronic obstructive pulmonary disease) (HCC) 12/12/2022   Acute hypoxemic respiratory failure (HCC) 12/12/2022   Acute on chronic combined systolic and diastolic CHF (congestive heart failure) (HCC) 04/24/2021   Anemia 04/24/2021   Acute on chronic respiratory  failure with hypoxia (HCC) 04/24/2021   Incarcerated inguinal hernia 03/18/2021   Chronic systolic CHF (congestive heart failure) (HCC)    Intraparenchymal hemorrhage of brain (HCC) 02/26/2021   SDH (subdural hematoma) (HCC) 02/21/2021   Fall at home, initial encounter 02/21/2021   History of stroke 02/21/2021   Elevated troponin 02/21/2021   Other spondylosis with radiculopathy, lumbar region 09/04/2018   Centrilobular emphysema (HCC) 07/19/2018   Dilated  cardiomyopathy (HCC) 07/19/2018   Coronary artery disease of native artery of native heart with stable angina pectoris (HCC) 01/20/2018   AAA (abdominal aortic aneurysm) without rupture (HCC) 10/30/2017   COPD exacerbation (HCC) 04/03/2015   Barrett's esophagus 05/20/2014   Constipation 05/20/2014   Arthritis of hand 05/07/2014   Pulmonary nodule 11/06/2011   Chronic hypoxic respiratory failure (HCC) 04/24/2011   Tobacco abuse 02/12/2011   CAD, NATIVE VESSEL/HLD 10/18/2008   COLONIC POLYPS 08/05/2008   Hyperlipidemia 08/05/2008   Essential hypertension 08/05/2008   GERD 08/05/2008   PCP:  Sari Cunning, MD Pharmacy:   [facility] - [place], [place] - [address] [place] [address] Phone: [phone] Fax: [phone]  [facility] [place], [place] - [address] [place] [address] Phone: [phone] Fax: [phone]  [facility] [address] [place] [address] Phone: [phone] Fax: [phone]     Social Drivers of Health (SDOH) Social History: SDOH Screenings   Food Insecurity: No Food Insecurity (07/28/2023)  Housing: Low Risk  (07/28/2023)  Transportation Needs: No Transportation Needs (07/28/2023)  Utilities: Not At Risk (07/28/2023)  Depression (PHQ2-9): Low Risk  (09/03/2018)  Financial Resource Strain: Low Risk  (01/03/2023)   Received from [facility]  Social Connections: Moderately Isolated (07/28/2023)  Stress: No Stress Concern Present (08/21/2018)  Tobacco Use: High Risk (07/28/2023)   SDOH Interventions:     Readmission Risk Interventions    04/28/2021    9:32 AM  Readmission Risk Prevention Plan  Transportation Screening Complete  PCP or Specialist Appt within 3-5 Days Complete  HRI or Home Care Consult Complete  Social Work Consult for Recovery Care Planning/Counseling Complete  Palliative Care  Screening Not Applicable  Medication Review Oceanographer) Complete

## 2023-07-31 NOTE — Evaluation (Signed)
 Occupational Therapy Evaluation Patient Details Name: [name] MRN: [id number] DOB: 1945-06-08 Today's Date: 07/31/2023   History of Present Illness   Pt is a 78 yo male that presents from SNF after fall and L hip hx, s/p surgical repair, WBAT; recent admission in March,  worked up for sepsis secondary to pneumonia, dc'd to SNF after that. PMH of COPD stage II, anxiety, stroke, neuromuscular disorder,depression, HTN, prostate cancer s/p seed implantation, HLD, lumbar disc disease, CAD, AAA , intraparenchymal hemorrhage in 02/2021, , nephrolithiasis, GERD, tobacco use and chronic ischemic heart failure.     Clinical Impressions Pt admitted based on above, and was seen based on problem list below. PTA pt was at [facility] completing rehab and receiving assistance with ADLs. Today pt is requiring set up  to total for ADLs. Bed mobility and functional transfers are  total +2 with use of lift equipment. Pt obtunded and rarely followed simple commands. Pt would briefly open eyes, but unable to answer orientation questions. Recommendation of <3 hours of skilled rehab daily. OT will continue to follow acutely to maximize functional independence.        If plan is discharge home, recommend the following:   Two people to help with walking and/or transfers;Two people to help with bathing/dressing/bathroom;Assistance with cooking/housework;Direct supervision/assist for medications management;Supervision due to cognitive status     Functional Status Assessment   Patient has had a recent decline in their functional status and demonstrates the ability to make significant improvements in function in a reasonable and predictable amount of time.     Equipment Recommendations   Other (comment) (Defer to next venue)     Recommendations for Other Services         Precautions/Restrictions   Precautions Precautions: Fall Recall of Precautions/Restrictions: Impaired Restrictions Weight Bearing  Restrictions Per Provider Order: Yes LLE Weight Bearing Per Provider Order: Weight bearing as tolerated     Mobility Bed Mobility Overal bed mobility: Needs Assistance Bed Mobility: Supine to Sit     Supine to sit: +2 for physical assistance, Total assist     General bed mobility comments: Total assist, unable to follow commands    Transfers Overall transfer level: Needs assistance Equipment used: Ambulation equipment used Transfers: Bed to chair/wheelchair/BSC, Sit to/from Stand Sit to Stand: Total assist, +2 physical assistance, +2 safety/equipment, Via lift equipment           General transfer comment: +2 assist, unable to follow commands fully, use of stedy Transfer via Lift Equipment: Stedy    Balance Overall balance assessment: Needs assistance Sitting-balance support: Bilateral upper extremity supported, Feet supported Sitting balance-Leahy Scale: Poor     Standing balance support: Bilateral upper extremity supported, During functional activity Standing balance-Leahy Scale: Zero Standing balance comment: Reliant on stedy handle bar       ADL either performed or assessed with clinical judgement   ADL Overall ADL's : Needs assistance/impaired Eating/Feeding: Set up;Sitting   Grooming: Set up;Sitting   Upper Body Bathing: Sitting;Set up   Lower Body Bathing: Moderate assistance;Sitting/lateral leans   Upper Body Dressing : Set up;Sitting   Lower Body Dressing: Moderate assistance;Sitting/lateral leans Lower Body Dressing Details (indicate cue type and reason): Pt able to cross RLE, unable to follow commands to doff socks Toilet Transfer: +2 for physical assistance;+2 for safety/equipment;Total assistance Toilet Transfer Details (indicate cue type and reason): Simulated in room +2 stedy         Functional mobility during ADLs: Total assistance;+2 for physical assistance;+2 for  safety/equipment;Cueing for safety General ADL Comments: Pt rarely followed  commands, cog limiting eval     Vision Baseline Vision/History: 1 Wears glasses Vision Assessment?: No apparent visual deficits            Pertinent Vitals/Pain Pain Assessment Pain Assessment: Faces Faces Pain Scale: Hurts even more Pain Location: L hip Pain Descriptors / Indicators: Grimacing, Guarding Pain Intervention(s): Monitored during session, Repositioned     Extremity/Trunk Assessment Upper Extremity Assessment Upper Extremity Assessment: Overall WFL for tasks assessed   Lower Extremity Assessment Lower Extremity Assessment: Defer to PT evaluation   Cervical / Trunk Assessment Cervical / Trunk Assessment: Other exceptions Cervical / Trunk Exceptions: Forward head posture with rounded shoulders   Communication Communication Communication: Impaired Factors Affecting Communication: Other (comment) (Decreased arousal level)   Cognition Arousal: Obtunded Behavior During Therapy: Flat affect Cognition: No family/caregiver present to determine baseline, Difficult to assess Difficult to assess due to: Level of arousal           OT - Cognition Comments: Pt unable to answer orientation questions, difficult to maintain arousal during session                 Following commands: Impaired Following commands impaired: Follows one step commands inconsistently     Cueing  General Comments   Cueing Techniques: Verbal cues;Tactile cues  VSS on 4L of O2           Home Living Family/patient expects to be discharged to:: Skilled nursing facility Living Arrangements: Alone Available Help at Discharge: Family;Available PRN/intermittently (Sons visit in the evening) Type of Home: Mobile home Home Access: Stairs to enter;Ramped entrance Entrance Stairs-Number of Steps: 4 Entrance Stairs-Rails: Can reach both Home Layout: One level     Bathroom Shower/Tub: Producer, television/film/video: Standard     Home Equipment: Agricultural consultant (2 wheels);Cane -  single point   Additional Comments: step sons bring food and drive to appointments if needed, checks on him nightly      Prior Functioning/Environment Prior Level of Function : Independent/Modified Independent             Mobility Comments: using rolling walker, no falls reported ADLs Comments: Per chart review, as of admission in March, Pt used RW for functional mobility and sons bring meals and assist with IADLs as needed    OT Problem List: Decreased strength;Decreased range of motion;Decreased activity tolerance;Impaired balance (sitting and/or standing);Decreased coordination;Decreased cognition;Decreased safety awareness;Decreased knowledge of use of DME or AE;Decreased knowledge of precautions   OT Treatment/Interventions: Self-care/ADL training;Therapeutic activities;Therapeutic exercise;Patient/family education;Balance training      OT Goals(Current goals can be found in the care plan section)   Acute Rehab OT Goals Patient Stated Goal: None stated OT Goal Formulation: With patient Time For Goal Achievement: 08/14/23 Potential to Achieve Goals: Good   OT Frequency:  Min 2X/week       AM-PAC OT "6 Clicks" Daily Activity     Outcome Measure Help from another person eating meals?: None Help from another person taking care of personal grooming?: A Little Help from another person toileting, which includes using toliet, bedpan, or urinal?: Total Help from another person bathing (including washing, rinsing, drying)?: A Lot Help from another person to put on and taking off regular upper body clothing?: A Little Help from another person to put on and taking off regular lower body clothing?: A Lot 6 Click Score: 15   End of Session Equipment Utilized During Treatment: Gait belt;Other (comment) (  Octaviano Belts) Nurse Communication: Mobility status  Activity Tolerance: Patient limited by fatigue Patient left:    OT Visit Diagnosis: Unsteadiness on feet (R26.81);Other  abnormalities of gait and mobility (R26.89);Repeated falls (R29.6);Muscle weakness (generalized) (M62.81);History of falling (Z91.81)                Time: 4540-9811 OT Time Calculation (min): 19 min Charges:  OT General Charges $OT Visit: 1 Visit OT Evaluation $OT Eval Moderate Complexity: 1 Mod  Delmer Ferraris, OT  Acute Rehabilitation Services Office [phone] Secure chat preferred   Mickael Alamo 07/31/2023, 12:49 PM

## 2023-08-01 DIAGNOSIS — R471 Dysarthria and anarthria: Secondary | ICD-10-CM | POA: Diagnosis not present

## 2023-08-01 DIAGNOSIS — I63511 Cerebral infarction due to unspecified occlusion or stenosis of right middle cerebral artery: Secondary | ICD-10-CM | POA: Diagnosis not present

## 2023-08-01 DIAGNOSIS — R000 Tachycardia, unspecified: Secondary | ICD-10-CM | POA: Diagnosis not present

## 2023-08-01 DIAGNOSIS — R64 Cachexia: Secondary | ICD-10-CM | POA: Diagnosis not present

## 2023-08-01 DIAGNOSIS — R1310 Dysphagia, unspecified: Secondary | ICD-10-CM | POA: Diagnosis not present

## 2023-08-01 DIAGNOSIS — G4489 Other headache syndrome: Secondary | ICD-10-CM | POA: Diagnosis not present

## 2023-08-01 DIAGNOSIS — I110 Hypertensive heart disease with heart failure: Secondary | ICD-10-CM | POA: Diagnosis not present

## 2023-08-01 DIAGNOSIS — I639 Cerebral infarction, unspecified: Secondary | ICD-10-CM | POA: Diagnosis present

## 2023-08-01 DIAGNOSIS — R278 Other lack of coordination: Secondary | ICD-10-CM | POA: Diagnosis not present

## 2023-08-01 DIAGNOSIS — D62 Acute posthemorrhagic anemia: Secondary | ICD-10-CM | POA: Diagnosis not present

## 2023-08-01 DIAGNOSIS — Z96642 Presence of left artificial hip joint: Secondary | ICD-10-CM | POA: Diagnosis not present

## 2023-08-01 DIAGNOSIS — I5042 Chronic combined systolic (congestive) and diastolic (congestive) heart failure: Secondary | ICD-10-CM | POA: Diagnosis not present

## 2023-08-01 DIAGNOSIS — E876 Hypokalemia: Secondary | ICD-10-CM | POA: Diagnosis not present

## 2023-08-01 DIAGNOSIS — I6503 Occlusion and stenosis of bilateral vertebral arteries: Secondary | ICD-10-CM | POA: Diagnosis not present

## 2023-08-01 DIAGNOSIS — E7800 Pure hypercholesterolemia, unspecified: Secondary | ICD-10-CM | POA: Diagnosis not present

## 2023-08-01 DIAGNOSIS — I509 Heart failure, unspecified: Secondary | ICD-10-CM | POA: Diagnosis not present

## 2023-08-01 DIAGNOSIS — I6930 Unspecified sequelae of cerebral infarction: Secondary | ICD-10-CM | POA: Diagnosis not present

## 2023-08-01 DIAGNOSIS — R4701 Aphasia: Secondary | ICD-10-CM | POA: Diagnosis not present

## 2023-08-01 DIAGNOSIS — I739 Peripheral vascular disease, unspecified: Secondary | ICD-10-CM | POA: Diagnosis not present

## 2023-08-01 DIAGNOSIS — Z66 Do not resuscitate: Secondary | ICD-10-CM | POA: Diagnosis not present

## 2023-08-01 DIAGNOSIS — J449 Chronic obstructive pulmonary disease, unspecified: Secondary | ICD-10-CM | POA: Diagnosis not present

## 2023-08-01 DIAGNOSIS — I10 Essential (primary) hypertension: Secondary | ICD-10-CM | POA: Diagnosis not present

## 2023-08-01 DIAGNOSIS — R29709 NIHSS score 9: Secondary | ICD-10-CM | POA: Diagnosis not present

## 2023-08-01 DIAGNOSIS — G9389 Other specified disorders of brain: Secondary | ICD-10-CM | POA: Diagnosis not present

## 2023-08-01 DIAGNOSIS — R29810 Facial weakness: Secondary | ICD-10-CM | POA: Diagnosis not present

## 2023-08-01 DIAGNOSIS — E860 Dehydration: Secondary | ICD-10-CM | POA: Diagnosis not present

## 2023-08-01 DIAGNOSIS — I6622 Occlusion and stenosis of left posterior cerebral artery: Secondary | ICD-10-CM | POA: Diagnosis not present

## 2023-08-01 DIAGNOSIS — Z8679 Personal history of other diseases of the circulatory system: Secondary | ICD-10-CM | POA: Diagnosis not present

## 2023-08-01 DIAGNOSIS — S72002A Fracture of unspecified part of neck of left femur, initial encounter for closed fracture: Secondary | ICD-10-CM | POA: Diagnosis not present

## 2023-08-01 DIAGNOSIS — I2510 Atherosclerotic heart disease of native coronary artery without angina pectoris: Secondary | ICD-10-CM | POA: Diagnosis not present

## 2023-08-01 DIAGNOSIS — J441 Chronic obstructive pulmonary disease with (acute) exacerbation: Secondary | ICD-10-CM | POA: Diagnosis not present

## 2023-08-01 DIAGNOSIS — R29818 Other symptoms and signs involving the nervous system: Secondary | ICD-10-CM | POA: Diagnosis not present

## 2023-08-01 DIAGNOSIS — I700 Atherosclerosis of aorta: Secondary | ICD-10-CM | POA: Diagnosis not present

## 2023-08-01 DIAGNOSIS — Z8546 Personal history of malignant neoplasm of prostate: Secondary | ICD-10-CM | POA: Diagnosis not present

## 2023-08-01 DIAGNOSIS — G8194 Hemiplegia, unspecified affecting left nondominant side: Secondary | ICD-10-CM | POA: Diagnosis not present

## 2023-08-01 DIAGNOSIS — S72002D Fracture of unspecified part of neck of left femur, subsequent encounter for closed fracture with routine healing: Secondary | ICD-10-CM | POA: Diagnosis not present

## 2023-08-01 DIAGNOSIS — M6281 Muscle weakness (generalized): Secondary | ICD-10-CM | POA: Diagnosis not present

## 2023-08-01 DIAGNOSIS — R2689 Other abnormalities of gait and mobility: Secondary | ICD-10-CM | POA: Diagnosis not present

## 2023-08-01 DIAGNOSIS — R4781 Slurred speech: Secondary | ICD-10-CM | POA: Diagnosis not present

## 2023-08-01 DIAGNOSIS — R29712 NIHSS score 12: Secondary | ICD-10-CM | POA: Diagnosis not present

## 2023-08-01 DIAGNOSIS — J431 Panlobular emphysema: Secondary | ICD-10-CM | POA: Diagnosis not present

## 2023-08-01 DIAGNOSIS — K219 Gastro-esophageal reflux disease without esophagitis: Secondary | ICD-10-CM | POA: Diagnosis not present

## 2023-08-01 DIAGNOSIS — R531 Weakness: Secondary | ICD-10-CM | POA: Diagnosis not present

## 2023-08-01 DIAGNOSIS — I6389 Other cerebral infarction: Secondary | ICD-10-CM | POA: Diagnosis not present

## 2023-08-01 DIAGNOSIS — J9611 Chronic respiratory failure with hypoxia: Secondary | ICD-10-CM | POA: Diagnosis not present

## 2023-08-01 DIAGNOSIS — Z743 Need for continuous supervision: Secondary | ICD-10-CM | POA: Diagnosis not present

## 2023-08-01 DIAGNOSIS — I5022 Chronic systolic (congestive) heart failure: Secondary | ICD-10-CM | POA: Diagnosis not present

## 2023-08-01 DIAGNOSIS — F17210 Nicotine dependence, cigarettes, uncomplicated: Secondary | ICD-10-CM | POA: Diagnosis not present

## 2023-08-01 DIAGNOSIS — Z9981 Dependence on supplemental oxygen: Secondary | ICD-10-CM | POA: Diagnosis not present

## 2023-08-01 LAB — CBC WITH DIFFERENTIAL/PLATELET
Abs Immature Granulocytes: 0.06 10*3/uL (ref 0.00–0.07)
Basophils Absolute: 0 10*3/uL (ref 0.0–0.1)
Basophils Relative: 1 %
Eosinophils Absolute: 0.3 10*3/uL (ref 0.0–0.5)
Eosinophils Relative: 3 %
HCT: 36.1 % — ABNORMAL LOW (ref 39.0–52.0)
Hemoglobin: 11.8 g/dL — ABNORMAL LOW (ref 13.0–17.0)
Immature Granulocytes: 1 %
Lymphocytes Relative: 10 %
Lymphs Abs: 0.9 10*3/uL (ref 0.7–4.0)
MCH: 31.1 pg (ref 26.0–34.0)
MCHC: 32.7 g/dL (ref 30.0–36.0)
MCV: 95 fL (ref 80.0–100.0)
Monocytes Absolute: 0.7 10*3/uL (ref 0.1–1.0)
Monocytes Relative: 8 %
Neutro Abs: 6.7 10*3/uL (ref 1.7–7.7)
Neutrophils Relative %: 77 %
Platelets: 185 10*3/uL (ref 150–400)
RBC: 3.8 MIL/uL — ABNORMAL LOW (ref 4.22–5.81)
RDW: 13.4 % (ref 11.5–15.5)
WBC: 8.6 10*3/uL (ref 4.0–10.5)
nRBC: 0 % (ref 0.0–0.2)

## 2023-08-01 LAB — BASIC METABOLIC PANEL WITH GFR
Anion gap: 13 (ref 5–15)
BUN: 36 mg/dL — ABNORMAL HIGH (ref 8–23)
CO2: 26 mmol/L (ref 22–32)
Calcium: 8.5 mg/dL — ABNORMAL LOW (ref 8.9–10.3)
Chloride: 102 mmol/L (ref 98–111)
Creatinine, Ser: 1.44 mg/dL — ABNORMAL HIGH (ref 0.61–1.24)
GFR, Estimated: 49 mL/min — ABNORMAL LOW (ref >60)
Glucose, Bld: 83 mg/dL (ref 70–99)
Potassium: 3.8 mmol/L (ref 3.5–5.1)
Sodium: 141 mmol/L (ref 135–145)

## 2023-08-01 NOTE — Discharge Summary (Signed)
 Physician Discharge Summary  [name]:[id number] DOB: 1946/03/23 DOA: 07/28/2023  PCP: Danella Penton, MD  Admit date: 07/28/2023 Discharge date: 08/01/2023  Admitted From: Home Disposition: SNF  Recommendations for Outpatient Follow-up:  Follow up with PCP in 1-2 weeks Follow-up with orthopedic surgery as scheduled  Discharge Condition: Stable CODE STATUS: Full Diet recommendation: Low-salt low-fat diet  Brief/Interim Summary: [name] is a 78 y.o. male with medical history significant of chronic hypoxic respiratory failure 4 to 6 L oxygen at home at baseline, CHF with reduced EF 40 to 45%, essential hypertension, CAD, hyperlipidemia, AAA status post endovascular repair, and COPD presented emergency department for evaluation for mechanical fall.  Patient reported that he accidentally fell on the bathroom landed on the left-sided hip and complaining about left-sided hip pain and pelvic pain.  Patient admitted as above for acute hip fracture on the left from a mechanical fall.  Patient tolerated ORIF quite well 07/28/2023.  At this time continues to work with physical therapy recommending ongoing evaluation and therapy at skilled nursing facility.  Patient had notable postop anemia improved with transfusion, chronic history including heart failure and COPD appears well-controlled on current regimen, no changes at this time.  He is otherwise stable and agreeable for discharge to facility for ongoing physical therapy evaluation and care prior to returning home which is his ultimate goal.  Medication changes as below.  Discharge Diagnoses:  Principal Problem:   Fracture of femoral neck, left (HCC) Active Problems:   COPD (chronic obstructive pulmonary disease) (HCC)   Essential hypertension   Hyperlipidemia   Chronic hypoxic respiratory failure (HCC)   Fall at home, initial encounter   Chronic systolic CHF (congestive heart failure) (HCC)   History of CAD (coronary artery  disease)   History of AAA (abdominal aortic aneurysm) repair  Acute hip fracture left-sided Tolerated ORIF, PT recommending SNF   Acute blood loss anemia expected from surgery Transfused 1 unit PRBC intraoperatively   HFrEF EF 45% without acute exacerbation Appears euvolemic, resume home regimen   Prior history of AAA status post endovascular repair with by iliac stent grafting,  Stable without acute issues Underlying CAD with stenting 2012 Reported CVA Continue atorvastatin 80 daily, aspirin   COPD Gold stage II chronic hypoxia on 4 L for the past year No overt wheeze-at discharge resume albuterol and Spiriva 18 mcg/DuoNeb has been resumed here Continue oxygen - goal 4L (current baseline 4-6L)  Discharge Instructions  Discharge Instructions     (HEART FAILURE PATIENTS) Call MD:  Anytime you have any of the following symptoms: 1) 3 pound weight gain in 24 hours or 5 pounds in 1 week 2) shortness of breath, with or without a dry hacking cough 3) swelling in the hands, feet or stomach 4) if you have to sleep on extra pillows at night in order to breathe.   Complete by: As directed    Call MD for:  difficulty breathing, headache or visual disturbances   Complete by: As directed    Call MD for:  extreme fatigue   Complete by: As directed    Call MD for:  hives   Complete by: As directed    Call MD for:  persistant dizziness or light-headedness   Complete by: As directed    Call MD for:  persistant nausea and vomiting   Complete by: As directed    Call MD for:  redness, tenderness, or signs of infection (pain, swelling, redness, odor or green/yellow discharge around  incision site)   Complete by: As directed    Call MD for:  severe uncontrolled pain   Complete by: As directed    Call MD for:  temperature >100.4   Complete by: As directed    Diet - low sodium heart healthy   Complete by: As directed    Increase activity slowly   Complete by: As directed       Allergies as  of 08/01/2023       Reactions   Lyrica [pregabalin] Swelling           Medication List     STOP taking these medications    methylPREDNISolone 4 MG Tbpk tablet Commonly known as: MEDROL DOSEPAK       TAKE these medications    acetaminophen 325 MG tablet Commonly known as: TYLENOL Take 2 tablets (650 mg total) by mouth every 6 (six) hours as needed for mild pain (or Fever >/= 101).   albuterol 108 (90 Base) MCG/ACT inhaler Commonly known as: VENTOLIN HFA Inhale 2 puffs into the lungs every 6 (six) hours as needed for wheezing.   albuterol (2.5 MG/3ML) 0.083% nebulizer solution Commonly known as: PROVENTIL Take 2.5 mg by nebulization every 6 (six) hours as needed.   aspirin EC 81 MG tablet Take 81 mg by mouth daily.   atorvastatin 80 MG tablet Commonly known as: LIPITOR Take 1 tablet (80 mg total) by mouth daily.   dapagliflozin propanediol 10 MG Tabs tablet Commonly known as: FARXIGA Take 1 tablet (10 mg total) by mouth daily.   furosemide 20 MG tablet Commonly known as: LASIX Take 1 tablet (20 mg total) by mouth daily. Take 40 mg (2 tablets) on Monday and Friday only. Take Lasix 20 mg (1 tablet) on the other days.   ipratropium-albuterol 0.5-2.5 (3) MG/3ML Soln Commonly known as: DUONEB Use 1 vial twice a day (scheduled) and every 4 hours as needed for shortness of breath and wheezing as directed (Use twice a day scheduled and every 4 hours as needed for shortness of breath and wheezing)   midodrine 5 MG tablet Commonly known as: PROAMATINE Take 1 tablet (5 mg total) by mouth 2 (two) times daily with a meal.   multivitamin with minerals tablet Take 1 tablet by mouth daily.   omeprazole 40 MG capsule Commonly known as: PRILOSEC TAKE 1 CAPSULE BY MOUTH DAILY USUALLY BEFORE BREAKFAST   spironolactone 25 MG tablet Commonly known as: ALDACTONE Take 0.5 tablets (12.5 mg total) by mouth daily. FOR CHRONIC COMBINED CHF   tiotropium 18 MCG  inhalation capsule Commonly known as: Spiriva HandiHaler Place 1 capsule (18 mcg total) into inhaler and inhale daily.        Allergies  Allergen Reactions   Lyrica [Pregabalin] Swelling         Consultations: Orthopedic surgery  Procedures/Studies: DG HIP UNILAT WITH PELVIS 1V LEFT Result Date: 07/28/2023 CLINICAL DATA:  [id number] Postop check [id number]; [id number] Surgery, elective [id number] EXAM: PORTABLE PELVIS 1-2 VIEWS; DG HIP (WITH OR WITHOUT PELVIS) 1V*L* COMPARISON:  X-ray left hip 07/28/2023 2:53 a.m. FINDINGS: Interval total left hip arthroplasty. No radiographic findings to suggest surgical hardware complication. No acute displaced fracture or dislocation of the right hip on frontal view. There is no evidence of pelvic fracture or diastasis. No pelvic bone lesions are seen. Overlying left hip subcutaneus soft tissue edema and emphysema consistent with postsurgical changes. Vascular calcifications. Radiation seeds overlie the pelvis. Bilateral iliac stent graft partially visualized. Lumbar surgical hardware partially  visualized. IMPRESSION: Interval total left hip arthroplasty. Electronically Signed   By: Tish Frederickson M.D.   On: 07/28/2023 18:29   DG Pelvis Portable Result Date: 07/28/2023 CLINICAL DATA:  [id number] Postop check [id number]; [id number] Surgery, elective [id number] EXAM: PORTABLE PELVIS 1-2 VIEWS; DG HIP (WITH OR WITHOUT PELVIS) 1V*L* COMPARISON:  X-ray left hip 07/28/2023 2:53 a.m. FINDINGS: Interval total left hip arthroplasty. No radiographic findings to suggest surgical hardware complication. No acute displaced fracture or dislocation of the right hip on frontal view. There is no evidence of pelvic fracture or diastasis. No pelvic bone lesions are seen. Overlying left hip subcutaneus soft tissue edema and emphysema consistent with postsurgical changes. Vascular calcifications. Radiation seeds overlie the pelvis. Bilateral iliac stent graft partially visualized. Lumbar surgical hardware  partially visualized. IMPRESSION: Interval total left hip arthroplasty. Electronically Signed   By: Tish Frederickson M.D.   On: 07/28/2023 18:29   CT HEAD WO CONTRAST ( ) Result Date: 07/28/2023 CLINICAL DATA:  Fall, poly trauma EXAM: CT HEAD WITHOUT CONTRAST CT CERVICAL SPINE WITHOUT CONTRAST TECHNIQUE: Multidetector CT imaging of the head and cervical spine was performed following the standard protocol without intravenous contrast. Multiplanar CT image reconstructions of the cervical spine were also generated. RADIATION DOSE REDUCTION: This exam was performed according to the departmental dose-optimization program which includes automated exposure control, adjustment of the mA and/or kV according to patient size and/or use of iterative reconstruction technique. COMPARISON:  07/19/2023 FINDINGS: CT HEAD FINDINGS Brain: No evidence of acute infarction, hemorrhage, hydrocephalus, extra-axial collection or mass lesion/mass effect. Chronic areas of cortically based encephalomalacia in the right frontal, parietal, occipital, and temporal cortex on the right with patchy chronic infarction in the right basal ganglia. Small calcifications along the right cerebral convexity which may be from prior calcified embolic disease. Vascular: No hyperdense vessel or unexpected calcification. Skull: Normal. Negative for fracture or focal lesion. Sinuses/Orbits: No acute finding. CT CERVICAL SPINE FINDINGS Alignment: No traumatic malalignment. Levels of mild degenerative anterolisthesis. Skull base and vertebrae: No acute fracture. No primary bone lesion or focal pathologic process. Soft tissues and spinal canal: No prevertebral fluid or swelling. No visible canal hematoma. Disc levels: Degenerative spurring asymmetrically affecting left-sided facets. Upper chest: Mild centrilobular emphysema. IMPRESSION: No evidence of acute intracranial or cervical spine injury. Electronically Signed   By: Tiburcio Pea M.D.   On: 07/28/2023  04:08   CT Cervical Spine Wo Contrast Result Date: 07/28/2023 CLINICAL DATA:  Fall, poly trauma EXAM: CT HEAD WITHOUT CONTRAST CT CERVICAL SPINE WITHOUT CONTRAST TECHNIQUE: Multidetector CT imaging of the head and cervical spine was performed following the standard protocol without intravenous contrast. Multiplanar CT image reconstructions of the cervical spine were also generated. RADIATION DOSE REDUCTION: This exam was performed according to the departmental dose-optimization program which includes automated exposure control, adjustment of the mA and/or kV according to patient size and/or use of iterative reconstruction technique. COMPARISON:  07/19/2023 FINDINGS: CT HEAD FINDINGS Brain: No evidence of acute infarction, hemorrhage, hydrocephalus, extra-axial collection or mass lesion/mass effect. Chronic areas of cortically based encephalomalacia in the right frontal, parietal, occipital, and temporal cortex on the right with patchy chronic infarction in the right basal ganglia. Small calcifications along the right cerebral convexity which may be from prior calcified embolic disease. Vascular: No hyperdense vessel or unexpected calcification. Skull: Normal. Negative for fracture or focal lesion. Sinuses/Orbits: No acute finding. CT CERVICAL SPINE FINDINGS Alignment: No traumatic malalignment. Levels of mild degenerative anterolisthesis. Skull base and vertebrae: No acute fracture. No primary  bone lesion or focal pathologic process. Soft tissues and spinal canal: No prevertebral fluid or swelling. No visible canal hematoma. Disc levels: Degenerative spurring asymmetrically affecting left-sided facets. Upper chest: Mild centrilobular emphysema. IMPRESSION: No evidence of acute intracranial or cervical spine injury. Electronically Signed   By: Tiburcio Pea M.D.   On: 07/28/2023 04:08   DG HIP UNILAT WITH PELVIS 2-3 VIEWS LEFT Result Date: 07/28/2023 CLINICAL DATA:  Fall, pain EXAM: DG HIP (WITH OR WITHOUT  PELVIS) 2-3V LEFT COMPARISON:  Pelvis today FINDINGS: There is a left femoral neck fracture with varus angulation and impaction. No subluxation or dislocation. Radiation seeds in the region the prostate. IMPRESSION: Left femoral neck fracture with varus angulation and impaction. Electronically Signed   By: Charlett Nose M.D.   On: 07/28/2023 03:00   DG Chest Port 1 View Result Date: 07/28/2023 CLINICAL DATA:  Fall EXAM: PORTABLE CHEST 1 VIEW COMPARISON:  None Available. FINDINGS: Heart is mildly enlarged. Lungs clear. No effusions or edema. No pneumothorax. No acute bony abnormality. IMPRESSION: Mild cardiomegaly.  No active disease. Electronically Signed   By: Charlett Nose M.D.   On: 07/28/2023 02:59   DG Pelvis Portable Result Date: 07/28/2023 CLINICAL DATA:  Fall.  Left hip pain EXAM: PORTABLE PELVIS 1-2 VIEWS COMPARISON:  03/22/2021 FINDINGS: There is a left femoral neck fracture with impaction and varus angulation. No subluxation or dislocation. Postoperative changes in the lower lumbar spine. SI joints symmetric and unremarkable. IMPRESSION: Left femoral neck fracture with impaction and varus angulation. Electronically Signed   By: Charlett Nose M.D.   On: 07/28/2023 02:59   CT Head Wo Contrast Result Date: 07/19/2023 CLINICAL DATA:  Provided history: Head trauma, minor.  Neck trauma. EXAM: CT HEAD WITHOUT CONTRAST CT CERVICAL SPINE WITHOUT CONTRAST TECHNIQUE: Multidetector CT imaging of the head and cervical spine was performed following the standard protocol without intravenous contrast. Multiplanar CT image reconstructions of the cervical spine were also generated. RADIATION DOSE REDUCTION: This exam was performed according to the departmental dose-optimization program which includes automated exposure control, adjustment of the mA and/or kV according to patient size and/or use of iterative reconstruction technique. COMPARISON:  Head CT 03/22/2021.  Cervical spine CT 02/21/2021. FINDINGS: CT HEAD  FINDINGS Brain: Generalized cerebral atrophy. Moderate-sized chronic infarct within the right parietal and occipital lobes (PCA vascular territory), new from the prior head CT of 03/22/2021. This infarct is at least partly chronic. However, portions of the infarct within the right occipital lobe may be subacute or chronic. Small chronic infarct within the posterior right frontal lobe (affecting the precentral gyrus), new from the prior CT). Sizable focus of chronic encephalomalacia/gliosis within the right temporal lobe (at site of prior parenchymal hemorrhage). Small focus of post-traumatic encephalomalacia/gliosis within the anteroinferior right frontal lobe. Chronic infarcts again demonstrated within/about the bilateral deep gray nuclei. Mild patchy and ill-defined hypoattenuation elsewhere with the cerebral white matter, nonspecific but compatible with chronic small vessel ischemic disease. Small chronic infarct within the left cerebellar hemisphere, unchanged. There is no acute intracranial hemorrhage. No acute demarcated cortical infarct. No extra-axial fluid collection. No evidence of an intracranial mass. No midline shift. Vascular: No hyperdense vessel.  Atherosclerotic calcifications. Skull: No calvarial fracture or aggressive osseous lesion. Sinuses/Orbits: No mass or acute finding within the imaged orbits. Mild mucosal thickening within the left maxillary and bilateral ethmoid sinuses. Other: Small-volume fluid within the mastoid air cells. CT CERVICAL SPINE FINDINGS Alignment: Dextrocurvature of the cervical spine. Mild grade 1 anterolisthesis at C5-C6 and  C6-C7. Skull base and vertebrae: The basion-dental and atlanto-dental intervals are maintained.No evidence of acute fracture to the cervical spine. Soft tissues and spinal canal: No prevertebral fluid or swelling. No visible canal hematoma. Disc levels: Cervical spondylosis with multilevel disc bulges/disc protrusions, uncovertebral hypertrophy and  facet arthropathy. No appreciable high-grade spinal canal stenosis. Multilevel bony neural foraminal narrowing. Degenerative changes also present at the C1-C2 articulation. Upper chest: The right lung apex is excluded from the field of view. No consolidation within the imaged left lung apex. No visible pneumothorax. IMPRESSION: CT head: 1. No acute intracranial hemorrhage. 2. Moderate-sized chronic infarct within the right parietal and occipital lobes (PCA vascular territory), new from the prior head CT of 03/22/2021. This infarct is at least partly chronic. However, portions of the infarct within the right occipital lobe may be subacute or chronic. A brain MRI may be obtained for further evaluation, as clinically warranted. 3. Sizable focus of chronic encephalomalacia/gliosis within the right temporal lobe (at site of prior parenchymal hemorrhage). 4. Small focus of post-traumatic encephalomalacia/gliosis within the anteroinferior right frontal lobe. 5. Background parenchymal atrophy, chronic small vessel ischemic disease and chronic infarcts, as described. 6. Mild paranasal sinus mucosal thickening. 7. Small-volume fluid within the mastoid air cells. CT cervical spine: 1. No evidence of an acute cervical spine fracture. 2. Dextrocurvature of the cervical spine. 3. Mild grade 1 anterolisthesis at C5-C6 and C6-C7, unchanged from the prior cervical spine CT of 02/21/2021. 4. Cervical spondylosis as described. Electronically Signed   By: Jackey Loge D.O.   On: 07/19/2023 08:58   CT Cervical Spine Wo Contrast Result Date: 07/19/2023 CLINICAL DATA:  Provided history: Head trauma, minor.  Neck trauma. EXAM: CT HEAD WITHOUT CONTRAST CT CERVICAL SPINE WITHOUT CONTRAST TECHNIQUE: Multidetector CT imaging of the head and cervical spine was performed following the standard protocol without intravenous contrast. Multiplanar CT image reconstructions of the cervical spine were also generated. RADIATION DOSE REDUCTION: This  exam was performed according to the departmental dose-optimization program which includes automated exposure control, adjustment of the mA and/or kV according to patient size and/or use of iterative reconstruction technique. COMPARISON:  Head CT 03/22/2021.  Cervical spine CT 02/21/2021. FINDINGS: CT HEAD FINDINGS Brain: Generalized cerebral atrophy. Moderate-sized chronic infarct within the right parietal and occipital lobes (PCA vascular territory), new from the prior head CT of 03/22/2021. This infarct is at least partly chronic. However, portions of the infarct within the right occipital lobe may be subacute or chronic. Small chronic infarct within the posterior right frontal lobe (affecting the precentral gyrus), new from the prior CT). Sizable focus of chronic encephalomalacia/gliosis within the right temporal lobe (at site of prior parenchymal hemorrhage). Small focus of post-traumatic encephalomalacia/gliosis within the anteroinferior right frontal lobe. Chronic infarcts again demonstrated within/about the bilateral deep gray nuclei. Mild patchy and ill-defined hypoattenuation elsewhere with the cerebral white matter, nonspecific but compatible with chronic small vessel ischemic disease. Small chronic infarct within the left cerebellar hemisphere, unchanged. There is no acute intracranial hemorrhage. No acute demarcated cortical infarct. No extra-axial fluid collection. No evidence of an intracranial mass. No midline shift. Vascular: No hyperdense vessel.  Atherosclerotic calcifications. Skull: No calvarial fracture or aggressive osseous lesion. Sinuses/Orbits: No mass or acute finding within the imaged orbits. Mild mucosal thickening within the left maxillary and bilateral ethmoid sinuses. Other: Small-volume fluid within the mastoid air cells. CT CERVICAL SPINE FINDINGS Alignment: Dextrocurvature of the cervical spine. Mild grade 1 anterolisthesis at C5-C6 and C6-C7. Skull base and vertebrae: The  basion-dental and atlanto-dental intervals are maintained.No evidence of acute fracture to the cervical spine. Soft tissues and spinal canal: No prevertebral fluid or swelling. No visible canal hematoma. Disc levels: Cervical spondylosis with multilevel disc bulges/disc protrusions, uncovertebral hypertrophy and facet arthropathy. No appreciable high-grade spinal canal stenosis. Multilevel bony neural foraminal narrowing. Degenerative changes also present at the C1-C2 articulation. Upper chest: The right lung apex is excluded from the field of view. No consolidation within the imaged left lung apex. No visible pneumothorax. IMPRESSION: CT head: 1. No acute intracranial hemorrhage. 2. Moderate-sized chronic infarct within the right parietal and occipital lobes (PCA vascular territory), new from the prior head CT of 03/22/2021. This infarct is at least partly chronic. However, portions of the infarct within the right occipital lobe may be subacute or chronic. A brain MRI may be obtained for further evaluation, as clinically warranted. 3. Sizable focus of chronic encephalomalacia/gliosis within the right temporal lobe (at site of prior parenchymal hemorrhage). 4. Small focus of post-traumatic encephalomalacia/gliosis within the anteroinferior right frontal lobe. 5. Background parenchymal atrophy, chronic small vessel ischemic disease and chronic infarcts, as described. 6. Mild paranasal sinus mucosal thickening. 7. Small-volume fluid within the mastoid air cells. CT cervical spine: 1. No evidence of an acute cervical spine fracture. 2. Dextrocurvature of the cervical spine. 3. Mild grade 1 anterolisthesis at C5-C6 and C6-C7, unchanged from the prior cervical spine CT of 02/21/2021. 4. Cervical spondylosis as described. Electronically Signed   By: Jackey Loge D.O.   On: 07/19/2023 08:58   DG Chest Port 1 View Result Date: 07/07/2023 CLINICAL DATA:  [id number] with shortness of breath. EXAM: PORTABLE CHEST 1 VIEW  COMPARISON:  Portable chest today at 12:54 a.m. FINDINGS: 6:47 a.m. The left lung base was partially excluded from the study. Also, the patient is obliquely rotated to the left further limiting the study. There is again noted left pleural effusion and overlying basilar airspace disease, with increased patchy consolidation now seen in left upper to mid lung region. Right lung remains clear. There is cardiomegaly with continued central vascular prominence. No overt edema. Stable mediastinum with aortic tortuosity and calcification. No new osseous findings. IMPRESSION: 1. Increased patchy consolidation in the left upper to mid lung region. 2. Persistent left pleural effusion and overlying basilar airspace disease. 3. Cardiomegaly with continued central vascular prominence. No overt edema. 4. Aortic atherosclerosis. Electronically Signed   By: Almira Bar M.D.   On: 07/07/2023 07:23   DG Chest Port 1 View Result Date: 07/07/2023 CLINICAL DATA:  Hypoxia EXAM: PORTABLE CHEST 1 VIEW COMPARISON:  07/04/2023 FINDINGS: Cardiac enlargement. No vascular congestion. Slight interstitial changes in the lung bases could indicate edema or fibrosis. Small left pleural effusion with basilar atelectasis or consolidation. Appearances are similar to prior study. Emphysematous changes suggested in the lungs. No pneumothorax. Mediastinal contours appear intact. Calcification of the aorta. IMPRESSION: Cardiac enlargement. Left pleural effusion with basilar atelectasis. Interstitial fibrosis or edema in the lung bases. Electronically Signed   By: Burman Nieves M.D.   On: 07/07/2023 00:59   DG Chest Port 1 View Result Date: 07/04/2023 CLINICAL DATA:  Questionable sepsis-evaluate for abnormality EXAM: PORTABLE CHEST 1 VIEW COMPARISON:  01/16/2023 FINDINGS: Stable cardiomediastinal silhouette. Aortic atherosclerotic calcification. Retrocardiac atelectasis or pneumonia. Small left pleural effusion. The right lung is clear. No  pneumothorax. No displaced rib fracture. IMPRESSION: Retrocardiac atelectasis or pneumonia and small left pleural effusion. Electronically Signed   By: Minerva Fester M.D.   On: 07/04/2023 02:28  Subjective: No acute issues or events overnight denies nausea vomiting diarrhea constipation any fever chills chest pain   Discharge Exam: Vitals:   08/01/23 0734 08/01/23 0836  BP: 130/65   Pulse: 63   Resp: 18   Temp: 98.3 F (36.8 C)   SpO2: 95% 96%   Vitals:   07/31/23 2009 08/01/23 0432 08/01/23 0734 08/01/23 0836  BP: 126/62 131/71 130/65   Pulse: 82 (!) 58 63   Resp: 16 16 18    Temp: 99.3 F (37.4 C) 98.7 F (37.1 C) 98.3 F (36.8 C)   TempSrc:   Oral   SpO2: 96% 94% 95% 96%  Weight:      Height:        General: Pt is alert, awake, not in acute distress Cardiovascular: RRR, S1/S2 +, no rubs, no gallops Respiratory: CTA bilaterally, no wheezing, no rhonchi Abdominal: Soft, NT, ND, bowel sounds + Extremities: no edema, no cyanosis, limited range of motion left lower extremity secondary to pain    The results of significant diagnostics from this hospitalization (including imaging, microbiology, ancillary and laboratory) are listed below for reference.     Microbiology: Recent Results (from the past 240 hours)  Surgical pcr screen     Status: None   Collection Time: 07/28/23  8:12 AM   Specimen: Nasal Mucosa; Nasal Swab  Result Value Ref Range Status   MRSA, PCR NEGATIVE NEGATIVE Final   Staphylococcus aureus NEGATIVE NEGATIVE Final    Comment: (NOTE) The Xpert SA Assay (FDA approved for NASAL specimens in patients 49 years of age and older), is one component of a comprehensive surveillance program. It is not intended to diagnose infection nor to guide or monitor treatment. Performed at [facility], [address], [place], [place] [address]      Labs: BNP (last 3 results) Recent Labs    01/23/23 1228 07/04/23 0254 07/07/23 0538  BNP 663.3*  384.6* 1,463.7*   Basic Metabolic Panel: Recent Labs  Lab 07/28/23 0406 07/29/23 0919 07/30/23 0252 07/31/23 0440 08/01/23 0608  NA 141 139 139 141 141  K 3.5 4.2 3.9 4.0 3.8  CL 105 100 104 105 102  CO2 28 30 28 28 26   GLUCOSE 133* 101* 116* 91 83  BUN 15 19 26* 23 36*  CREATININE 1.45* 1.21 1.49* 1.15 1.44*  CALCIUM 8.2* 8.6* 8.3* 8.4* 8.5*   Liver Function Tests: Recent Labs  Lab 07/29/23 0919  AST 28  ALT 16  ALKPHOS 65  BILITOT 1.0  PROT 6.1*  ALBUMIN 2.5*   CBC: Recent Labs  Lab 07/28/23 0406 07/28/23 0555 07/29/23 0919 07/30/23 0252 07/31/23 0440 08/01/23 0608  WBC 15.4* 13.1* 11.3* 10.4 9.4 8.6  NEUTROABS 13.7*  --  9.7* 8.3* 7.6 6.7  HGB 12.7* 11.9* 11.7* 11.7* 11.8* 11.8*  HCT 40.0 37.0* 36.4* 35.9* 36.1* 36.1*  MCV 97.3 97.4 95.8 94.7 94.5 95.0  PLT 179 167 186 162 159 185   CBG: Recent Labs  Lab 07/28/23 1906  GLUCAP 112*   Urinalysis    Component Value Date/Time   COLORURINE YELLOW 07/04/2023 1032   APPEARANCEUR HAZY (A) 07/04/2023 1032   APPEARANCEUR Cloudy 04/21/2011 1418   LABSPEC 1.018 07/04/2023 1032   LABSPEC 1.013 04/21/2011 1418   PHURINE 5.0 07/04/2023 1032   GLUCOSEU >=500 (A) 07/04/2023 1032   GLUCOSEU Negative 04/21/2011 1418   HGBUR NEGATIVE 07/04/2023 1032   BILIRUBINUR NEGATIVE 07/04/2023 1032   BILIRUBINUR Negative 04/21/2011 1418   KETONESUR NEGATIVE 07/04/2023  1032   PROTEINUR NEGATIVE 07/04/2023 1032   NITRITE NEGATIVE 07/04/2023 1032   LEUKOCYTESUR NEGATIVE 07/04/2023 1032   LEUKOCYTESUR Negative 04/21/2011 1418   Sepsis Labs Recent Labs  Lab 07/29/23 0919 07/30/23 0252 07/31/23 0440 08/01/23 0608  WBC 11.3* 10.4 9.4 8.6   Microbiology Recent Results (from the past 240 hours)  Surgical pcr screen     Status: None   Collection Time: 07/28/23  8:12 AM   Specimen: Nasal Mucosa; Nasal Swab  Result Value Ref Range Status   MRSA, PCR NEGATIVE NEGATIVE Final   Staphylococcus aureus NEGATIVE NEGATIVE  Final    Comment: (NOTE) The Xpert SA Assay (FDA approved for NASAL specimens in patients 84 years of age and older), is one component of a comprehensive surveillance program. It is not intended to diagnose infection nor to guide or monitor treatment. Performed at [facility], [address] [place], [place] [address]      Time coordinating discharge: Over 30 minutes  SIGNED:   Haydee Lipa, DO [facility] 08/01/2023, 2:05 PM Pager   If 7PM-7AM, please contact night-coverage [email or web address]

## 2023-08-01 NOTE — TOC Transition Note (Signed)
 Transition of Care [facility]) - Discharge Note   Patient Details  Name: [name] MRN: [id number] Date of Birth: Sep 18, 1945  Transition of [facility]) CM/SW Contact:  Elspeth Hals, LCSW Phone Number: 08/01/2023, 2:24 PM   Clinical Narrative:   Pt discharging to [facility], room 305B. RN call report to [phone].  PTAR called [phone]  Final next level of care: Skilled Nursing Facility Barriers to Discharge: Barriers Resolved   Patient Goals and CMS Choice     Choice offered to / list presented to : Adult Children (son [name])      Discharge Placement              Patient chooses bed at: [facility] Patient to be transferred to facility by: ptar Name of family member notified: son [name] Patient and family notified of of transfer: 08/01/23  Discharge Plan and Services Additional resources added to the After Visit Summary for   In-house Referral: Clinical Social Work   Post Acute Care Choice: Skilled Nursing Facility                               Social Drivers of Health (SDOH) Interventions SDOH Screenings   Food Insecurity: No Food Insecurity (07/28/2023)  Housing: Low Risk  (07/28/2023)  Transportation Needs: No Transportation Needs (07/28/2023)  Utilities: Not At Risk (07/28/2023)  Depression (PHQ2-9): Low Risk  (09/03/2018)  Financial Resource Strain: Low Risk  (01/03/2023)   Received from [facility]  Social Connections: Moderately Isolated (07/28/2023)  Stress: No Stress Concern Present (08/21/2018)  Tobacco Use: High Risk (07/28/2023)     Readmission Risk Interventions    04/28/2021    9:32 AM  Readmission Risk Prevention Plan  Transportation Screening Complete  PCP or Specialist Appt within 3-5 Days Complete  HRI or Home Care Consult Complete  Social Work Consult for Recovery Care Planning/Counseling Complete  Palliative Care Screening Not Applicable  Medication Review Oceanographer) Complete

## 2023-08-01 NOTE — Progress Notes (Signed)
 Report called to [place] at [facility]. Patient sent on 3L [place] because he desated to 87% RA. CSW/CRN made aware and RN was told patient ok to d/c.

## 2023-08-01 NOTE — Progress Notes (Signed)
 Physical Therapy Treatment Patient Details Name: [name] MRN: [id number] DOB: Jun 09, 1945 Today's Date: 08/01/2023   History of Present Illness Pt is a 78 yo male that presents from SNF after fall and L hip hx, s/p surgical repair, WBAT; recent admission in March,  worked up for sepsis secondary to pneumonia, dc'd to SNF after that. PMH of COPD stage II, anxiety, stroke, neuromuscular disorder,depression, HTN, prostate cancer s/p seed implantation, HLD, lumbar disc disease, CAD, AAA , intraparenchymal hemorrhage in 02/2021, , nephrolithiasis, GERD, tobacco use and chronic ischemic heart failure.    PT Comments  Continuing work on functional mobility and activity tolerance;  Sesison focused on bed mobility and functional transfers, with very nice improvements noted; Most importantly, Jericho was alert and able to participate in conversation, follow commands, and let his needs be known, he participated well in getting to EOB, standing, and getting to the recliner; needed 2 person mod/max assist wit standing to RW and taking pivot steps (mostly heel-toe pivots on R LE); Patient will benefit from continued inpatient follow up therapy, <3 hours/day     If plan is discharge home, recommend the following: Two people to help with walking and/or transfers;Two people to help with bathing/dressing/bathroom   Can travel by private vehicle     No  Equipment Recommendations  None recommended by PT;Other (comment) (Or TBD back at SNF)    Recommendations for Other Services       Precautions / Restrictions Precautions Precautions: Fall Restrictions LLE Weight Bearing Per Provider Order: Weight bearing as tolerated     Mobility  Bed Mobility Overal bed mobility: Needs Assistance Bed Mobility: Supine to Sit     Supine to sit: +2 for physical assistance, Mod assist     General bed mobility comments: Able to help with scooting to EOB with RLE to half-bridge hips; good reach with R hand to rail on  L; 2 person mod assist to elevate trunk to sit    Transfers Overall transfer level: Needs assistance Equipment used: Rolling walker (2 wheels) Transfers: Sit to/from Stand, Bed to chair/wheelchair/BSC Sit to Stand: +2 physical assistance, Mod assist   Step pivot transfers: +2 physical assistance, Mod assist, Max assist       General transfer comment: +2 mod assist to power up to stand; tending to keep weight off of LLE during pivot steps to recliner (placed on pt's R side)    Ambulation/Gait                   Stairs             Wheelchair Mobility     Tilt Bed    Modified Rankin (Stroke Patients Only)       Balance     Sitting balance-Leahy Scale: Poor (approaching Fair)       Standing balance-Leahy Scale: Poor                              Communication Communication Communication: Impaired Factors Affecting Communication: Other (comment);Difficulty expressing self  Cognition Arousal: Alert Behavior During Therapy: Flat affect   PT - Cognitive impairments: No family/caregiver present to determine baseline                       PT - Cognition Comments: Much more alert and interactive today; Able to participate in conversation; occasionally needing incr time to answer questions Following commands: Impaired Following commands  impaired: Follows one step commands with increased time    Cueing Cueing Techniques: Verbal cues, Tactile cues  Exercises      General Comments General comments (skin integrity, edema, etc.): session conducted on 3L supplemental O2, and O2 sats 91% at end of session      Pertinent Vitals/Pain Pain Assessment Pain Assessment: Faces Faces Pain Scale: Hurts even more Pain Location: L hip Pain Descriptors / Indicators: Grimacing, Guarding Pain Intervention(s): Monitored during session    Home Living                          Prior Function            PT Goals (current goals can  now be found in the care plan section) Acute Rehab PT Goals Patient Stated Goal: Want to be up and OOB for eating PT Goal Formulation: With patient Time For Goal Achievement: 08/13/23 Potential to Achieve Goals: Fair Progress towards PT goals: Progressing toward goals    Frequency    Min 2X/week      PT Plan      Co-evaluation              AM-PAC PT "6 Clicks" Mobility   Outcome Measure  Help needed turning from your back to your side while in a flat bed without using bedrails?: A Lot Help needed moving from lying on your back to sitting on the side of a flat bed without using bedrails?: A Lot Help needed moving to and from a bed to a chair (including a wheelchair)?: Total Help needed standing up from a chair using your arms (e.g., wheelchair or bedside chair)?: Total Help needed to walk in hospital room?: Total Help needed climbing 3-5 steps with a railing? : Total 6 Click Score: 8    End of Session Equipment Utilized During Treatment: Gait belt;Oxygen Activity Tolerance: Patient tolerated treatment well Patient left: in chair;with call bell/phone within reach;with chair alarm set Nurse Communication: Mobility status PT Visit Diagnosis: Other abnormalities of gait and mobility (R26.89);Muscle weakness (generalized) (M62.81);History of falling (Z91.81);Pain;Other symptoms and signs involving the nervous system (R29.898) Pain - Right/Left: Left Pain - part of body: Hip     Time: 1140-1205 PT Time Calculation (min) (ACUTE ONLY): 25 min  Charges:    $Therapeutic Activity: 23-37 mins PT General Charges $$ ACUTE PT VISIT: 1 Visit                     Darcus Eastern, PT  Acute Rehabilitation Services Office [phone] Secure Chat welcomed    Marcial Setting 08/01/2023, 3:04 PM

## 2023-08-01 NOTE — TOC Progression Note (Addendum)
 Transition of Care [facility]) - Progression Note    Patient Details  Name: [name] MRN: [id number] Date of Birth: 1945-07-28  [facility]) CM/SW Contact  Elspeth Hals, LCSW Phone Number: 08/01/2023, 1:53 PM  Clinical Narrative:   CSW received call from Tammy/HTA: SNF auth approved, 7 days: [id number], PTAR approved: [phone].  CSW confirmed with Dar[facility] that they can receive pt today.  MD notified.     Expected Discharge Plan: Skilled Nursing Facility Barriers to Discharge: Continued Medical Work up, SNF Pending bed offer  Expected Discharge Plan and Services In-house Referral: Clinical Social Work   Post Acute Care Choice: Skilled Nursing Facility Living arrangements for the past 2 months: Single Family Home                                       Social Determinants of Health (SDOH) Interventions SDOH Screenings   Food Insecurity: No Food Insecurity (07/28/2023)  Housing: Low Risk  (07/28/2023)  Transportation Needs: No Transportation Needs (07/28/2023)  Utilities: Not At Risk (07/28/2023)  Depression (PHQ2-9): Low Risk  (09/03/2018)  Financial Resource Strain: Low Risk  (01/03/2023)   Received from [facility]  Social Connections: Moderately Isolated (07/28/2023)  Stress: No Stress Concern Present (08/21/2018)  Tobacco Use: High Risk (07/28/2023)    Readmission Risk Interventions    04/28/2021    9:32 AM  Readmission Risk Prevention Plan  Transportation Screening Complete  PCP or Specialist Appt within 3-5 Days Complete  HRI or Home Care Consult Complete  Social Work Consult for Recovery Care Planning/Counseling Complete  Palliative Care Screening Not Applicable  Medication Review Oceanographer) Complete

## 2023-08-02 DIAGNOSIS — I509 Heart failure, unspecified: Secondary | ICD-10-CM | POA: Diagnosis not present

## 2023-08-02 DIAGNOSIS — S72002A Fracture of unspecified part of neck of left femur, initial encounter for closed fracture: Secondary | ICD-10-CM | POA: Diagnosis not present

## 2023-08-02 DIAGNOSIS — D62 Acute posthemorrhagic anemia: Secondary | ICD-10-CM | POA: Diagnosis not present

## 2023-08-02 DIAGNOSIS — J9611 Chronic respiratory failure with hypoxia: Secondary | ICD-10-CM | POA: Diagnosis not present

## 2023-08-02 DIAGNOSIS — J441 Chronic obstructive pulmonary disease with (acute) exacerbation: Secondary | ICD-10-CM | POA: Diagnosis not present

## 2023-08-05 ENCOUNTER — Other Ambulatory Visit: Payer: Self-pay

## 2023-08-05 ENCOUNTER — Emergency Department (HOSPITAL_COMMUNITY)

## 2023-08-05 ENCOUNTER — Inpatient Hospital Stay (HOSPITAL_COMMUNITY)
Admission: EM | Admit: 2023-08-05 | Discharge: 2023-08-09 | DRG: 065 | Disposition: A | Discharge status: Skilled Nursing Facility | Source: Skilled Nursing Facility | Attending: Internal Medicine | Admitting: Internal Medicine

## 2023-08-05 ENCOUNTER — Encounter (HOSPITAL_COMMUNITY): Payer: Self-pay | Admitting: *Deleted

## 2023-08-05 DIAGNOSIS — K219 Gastro-esophageal reflux disease without esophagitis: Secondary | ICD-10-CM | POA: Diagnosis present

## 2023-08-05 DIAGNOSIS — Z8546 Personal history of malignant neoplasm of prostate: Secondary | ICD-10-CM

## 2023-08-05 DIAGNOSIS — I6503 Occlusion and stenosis of bilateral vertebral arteries: Secondary | ICD-10-CM | POA: Diagnosis not present

## 2023-08-05 DIAGNOSIS — E860 Dehydration: Secondary | ICD-10-CM | POA: Diagnosis present

## 2023-08-05 DIAGNOSIS — R471 Dysarthria and anarthria: Secondary | ICD-10-CM | POA: Diagnosis present

## 2023-08-05 DIAGNOSIS — R29818 Other symptoms and signs involving the nervous system: Secondary | ICD-10-CM | POA: Diagnosis not present

## 2023-08-05 DIAGNOSIS — I10 Essential (primary) hypertension: Secondary | ICD-10-CM | POA: Diagnosis present

## 2023-08-05 DIAGNOSIS — R1310 Dysphagia, unspecified: Secondary | ICD-10-CM

## 2023-08-05 DIAGNOSIS — F17210 Nicotine dependence, cigarettes, uncomplicated: Secondary | ICD-10-CM | POA: Diagnosis present

## 2023-08-05 DIAGNOSIS — J441 Chronic obstructive pulmonary disease with (acute) exacerbation: Secondary | ICD-10-CM | POA: Diagnosis not present

## 2023-08-05 DIAGNOSIS — E876 Hypokalemia: Secondary | ICD-10-CM | POA: Diagnosis not present

## 2023-08-05 DIAGNOSIS — R41841 Cognitive communication deficit: Secondary | ICD-10-CM | POA: Diagnosis not present

## 2023-08-05 DIAGNOSIS — R4701 Aphasia: Secondary | ICD-10-CM | POA: Diagnosis not present

## 2023-08-05 DIAGNOSIS — Z79899 Other long term (current) drug therapy: Secondary | ICD-10-CM

## 2023-08-05 DIAGNOSIS — Z8249 Family history of ischemic heart disease and other diseases of the circulatory system: Secondary | ICD-10-CM

## 2023-08-05 DIAGNOSIS — I63511 Cerebral infarction due to unspecified occlusion or stenosis of right middle cerebral artery: Principal | ICD-10-CM | POA: Diagnosis present

## 2023-08-05 DIAGNOSIS — Z7982 Long term (current) use of aspirin: Secondary | ICD-10-CM

## 2023-08-05 DIAGNOSIS — Z7902 Long term (current) use of antithrombotics/antiplatelets: Secondary | ICD-10-CM

## 2023-08-05 DIAGNOSIS — Z8679 Personal history of other diseases of the circulatory system: Secondary | ICD-10-CM

## 2023-08-05 DIAGNOSIS — Z9981 Dependence on supplemental oxygen: Secondary | ICD-10-CM

## 2023-08-05 DIAGNOSIS — R278 Other lack of coordination: Secondary | ICD-10-CM | POA: Diagnosis not present

## 2023-08-05 DIAGNOSIS — Z66 Do not resuscitate: Secondary | ICD-10-CM | POA: Diagnosis present

## 2023-08-05 DIAGNOSIS — I2510 Atherosclerotic heart disease of native coronary artery without angina pectoris: Secondary | ICD-10-CM | POA: Diagnosis not present

## 2023-08-05 DIAGNOSIS — I639 Cerebral infarction, unspecified: Secondary | ICD-10-CM | POA: Diagnosis present

## 2023-08-05 DIAGNOSIS — I6930 Unspecified sequelae of cerebral infarction: Secondary | ICD-10-CM | POA: Diagnosis not present

## 2023-08-05 DIAGNOSIS — R2689 Other abnormalities of gait and mobility: Secondary | ICD-10-CM | POA: Diagnosis not present

## 2023-08-05 DIAGNOSIS — J9611 Chronic respiratory failure with hypoxia: Secondary | ICD-10-CM | POA: Diagnosis present

## 2023-08-05 DIAGNOSIS — G4489 Other headache syndrome: Secondary | ICD-10-CM | POA: Diagnosis not present

## 2023-08-05 DIAGNOSIS — J431 Panlobular emphysema: Secondary | ICD-10-CM | POA: Diagnosis not present

## 2023-08-05 DIAGNOSIS — Z888 Allergy status to other drugs, medicaments and biological substances status: Secondary | ICD-10-CM

## 2023-08-05 DIAGNOSIS — Z8673 Personal history of transient ischemic attack (TIA), and cerebral infarction without residual deficits: Secondary | ICD-10-CM

## 2023-08-05 DIAGNOSIS — E7800 Pure hypercholesterolemia, unspecified: Secondary | ICD-10-CM | POA: Diagnosis present

## 2023-08-05 DIAGNOSIS — R29810 Facial weakness: Secondary | ICD-10-CM | POA: Diagnosis not present

## 2023-08-05 DIAGNOSIS — Z96642 Presence of left artificial hip joint: Secondary | ICD-10-CM | POA: Diagnosis present

## 2023-08-05 DIAGNOSIS — I739 Peripheral vascular disease, unspecified: Secondary | ICD-10-CM | POA: Diagnosis present

## 2023-08-05 DIAGNOSIS — R531 Weakness: Secondary | ICD-10-CM | POA: Diagnosis not present

## 2023-08-05 DIAGNOSIS — J449 Chronic obstructive pulmonary disease, unspecified: Secondary | ICD-10-CM | POA: Diagnosis not present

## 2023-08-05 DIAGNOSIS — R29712 NIHSS score 12: Secondary | ICD-10-CM | POA: Diagnosis not present

## 2023-08-05 DIAGNOSIS — R29709 NIHSS score 9: Secondary | ICD-10-CM | POA: Diagnosis not present

## 2023-08-05 DIAGNOSIS — R4781 Slurred speech: Secondary | ICD-10-CM | POA: Diagnosis not present

## 2023-08-05 DIAGNOSIS — R000 Tachycardia, unspecified: Secondary | ICD-10-CM | POA: Diagnosis not present

## 2023-08-05 DIAGNOSIS — E785 Hyperlipidemia, unspecified: Secondary | ICD-10-CM | POA: Diagnosis not present

## 2023-08-05 DIAGNOSIS — I110 Hypertensive heart disease with heart failure: Secondary | ICD-10-CM | POA: Diagnosis present

## 2023-08-05 DIAGNOSIS — G9389 Other specified disorders of brain: Secondary | ICD-10-CM | POA: Diagnosis not present

## 2023-08-05 DIAGNOSIS — M6281 Muscle weakness (generalized): Secondary | ICD-10-CM | POA: Diagnosis not present

## 2023-08-05 DIAGNOSIS — I700 Atherosclerosis of aorta: Secondary | ICD-10-CM | POA: Diagnosis not present

## 2023-08-05 DIAGNOSIS — R1312 Dysphagia, oropharyngeal phase: Secondary | ICD-10-CM | POA: Diagnosis not present

## 2023-08-05 DIAGNOSIS — R4182 Altered mental status, unspecified: Secondary | ICD-10-CM | POA: Diagnosis not present

## 2023-08-05 DIAGNOSIS — R2681 Unsteadiness on feet: Secondary | ICD-10-CM | POA: Diagnosis not present

## 2023-08-05 DIAGNOSIS — I6622 Occlusion and stenosis of left posterior cerebral artery: Secondary | ICD-10-CM | POA: Diagnosis not present

## 2023-08-05 DIAGNOSIS — Z87442 Personal history of urinary calculi: Secondary | ICD-10-CM

## 2023-08-05 DIAGNOSIS — Z6824 Body mass index (BMI) 24.0-24.9, adult: Secondary | ICD-10-CM

## 2023-08-05 DIAGNOSIS — G8194 Hemiplegia, unspecified affecting left nondominant side: Secondary | ICD-10-CM | POA: Diagnosis present

## 2023-08-05 DIAGNOSIS — R2990 Unspecified symptoms and signs involving the nervous system: Principal | ICD-10-CM

## 2023-08-05 DIAGNOSIS — I5022 Chronic systolic (congestive) heart failure: Secondary | ICD-10-CM | POA: Diagnosis present

## 2023-08-05 DIAGNOSIS — I5042 Chronic combined systolic (congestive) and diastolic (congestive) heart failure: Secondary | ICD-10-CM | POA: Diagnosis not present

## 2023-08-05 DIAGNOSIS — I5023 Acute on chronic systolic (congestive) heart failure: Secondary | ICD-10-CM | POA: Diagnosis present

## 2023-08-05 DIAGNOSIS — S72002D Fracture of unspecified part of neck of left femur, subsequent encounter for closed fracture with routine healing: Secondary | ICD-10-CM | POA: Diagnosis not present

## 2023-08-05 DIAGNOSIS — I6389 Other cerebral infarction: Secondary | ICD-10-CM | POA: Diagnosis not present

## 2023-08-05 DIAGNOSIS — I252 Old myocardial infarction: Secondary | ICD-10-CM

## 2023-08-05 DIAGNOSIS — Z7401 Bed confinement status: Secondary | ICD-10-CM | POA: Diagnosis not present

## 2023-08-05 DIAGNOSIS — R0902 Hypoxemia: Secondary | ICD-10-CM | POA: Diagnosis not present

## 2023-08-05 DIAGNOSIS — R64 Cachexia: Secondary | ICD-10-CM | POA: Diagnosis not present

## 2023-08-05 LAB — I-STAT VENOUS BLOOD GAS, ED
Acid-Base Excess: 5 mmol/L — ABNORMAL HIGH (ref 0.0–2.0)
Bicarbonate: 29.6 mmol/L — ABNORMAL HIGH (ref 20.0–28.0)
Calcium, Ion: 1.16 mmol/L (ref 1.15–1.40)
HCT: 38 % — ABNORMAL LOW (ref 39.0–52.0)
Hemoglobin: 12.9 g/dL — ABNORMAL LOW (ref 13.0–17.0)
O2 Saturation: 97 %
Potassium: 3.8 mmol/L (ref 3.5–5.1)
Sodium: 142 mmol/L (ref 135–145)
TCO2: 31 mmol/L (ref 22–32)
pCO2, Ven: 42.6 mmHg — ABNORMAL LOW (ref 44–60)
pH, Ven: 7.45 — ABNORMAL HIGH (ref 7.25–7.43)
pO2, Ven: 85 mmHg — ABNORMAL HIGH (ref 32–45)

## 2023-08-05 LAB — COMPREHENSIVE METABOLIC PANEL WITH GFR
ALT: 28 U/L (ref 0–44)
AST: 45 U/L — ABNORMAL HIGH (ref 15–41)
Albumin: 2.6 g/dL — ABNORMAL LOW (ref 3.5–5.0)
Alkaline Phosphatase: 89 U/L (ref 38–126)
Anion gap: 9 (ref 5–15)
BUN: 25 mg/dL — ABNORMAL HIGH (ref 8–23)
CO2: 26 mmol/L (ref 22–32)
Calcium: 8.7 mg/dL — ABNORMAL LOW (ref 8.9–10.3)
Chloride: 105 mmol/L (ref 98–111)
Creatinine, Ser: 1.33 mg/dL — ABNORMAL HIGH (ref 0.61–1.24)
GFR, Estimated: 54 mL/min — ABNORMAL LOW (ref >60)
Glucose, Bld: 139 mg/dL — ABNORMAL HIGH (ref 70–99)
Potassium: 3.8 mmol/L (ref 3.5–5.1)
Sodium: 140 mmol/L (ref 135–145)
Total Bilirubin: 1.2 mg/dL (ref 0.0–1.2)
Total Protein: 6.7 g/dL (ref 6.5–8.1)

## 2023-08-05 LAB — PROTIME-INR
INR: 1.1 (ref 0.8–1.2)
Prothrombin Time: 14.4 s (ref 11.4–15.2)

## 2023-08-05 LAB — CBC
HCT: 39.9 % (ref 39.0–52.0)
Hemoglobin: 12.8 g/dL — ABNORMAL LOW (ref 13.0–17.0)
MCH: 30.9 pg (ref 26.0–34.0)
MCHC: 32.1 g/dL (ref 30.0–36.0)
MCV: 96.4 fL (ref 80.0–100.0)
Platelets: 310 10*3/uL (ref 150–400)
RBC: 4.14 MIL/uL — ABNORMAL LOW (ref 4.22–5.81)
RDW: 13.4 % (ref 11.5–15.5)
WBC: 9.4 10*3/uL (ref 4.0–10.5)
nRBC: 0 % (ref 0.0–0.2)

## 2023-08-05 LAB — DIFFERENTIAL
Abs Immature Granulocytes: 0.07 10*3/uL (ref 0.00–0.07)
Basophils Absolute: 0.1 10*3/uL (ref 0.0–0.1)
Basophils Relative: 1 %
Eosinophils Absolute: 0.4 10*3/uL (ref 0.0–0.5)
Eosinophils Relative: 5 %
Immature Granulocytes: 1 %
Lymphocytes Relative: 13 %
Lymphs Abs: 1.2 10*3/uL (ref 0.7–4.0)
Monocytes Absolute: 0.8 10*3/uL (ref 0.1–1.0)
Monocytes Relative: 9 %
Neutro Abs: 6.8 10*3/uL (ref 1.7–7.7)
Neutrophils Relative %: 71 %

## 2023-08-05 LAB — I-STAT CHEM 8, ED
BUN: 28 mg/dL — ABNORMAL HIGH (ref 8–23)
Calcium, Ion: 1.08 mmol/L — ABNORMAL LOW (ref 1.15–1.40)
Chloride: 105 mmol/L (ref 98–111)
Creatinine, Ser: 1.5 mg/dL — ABNORMAL HIGH (ref 0.61–1.24)
Glucose, Bld: 133 mg/dL — ABNORMAL HIGH (ref 70–99)
HCT: 39 % (ref 39.0–52.0)
Hemoglobin: 13.3 g/dL (ref 13.0–17.0)
Potassium: 3.9 mmol/L (ref 3.5–5.1)
Sodium: 141 mmol/L (ref 135–145)
TCO2: 28 mmol/L (ref 22–32)

## 2023-08-05 LAB — CBG MONITORING, ED: Glucose-Capillary: 132 mg/dL — ABNORMAL HIGH (ref 70–99)

## 2023-08-05 LAB — ETHANOL: Alcohol, Ethyl (B): <10 mg/dL (ref <10)

## 2023-08-05 LAB — APTT: aPTT: 26 s (ref 24–36)

## 2023-08-05 MED ORDER — PANTOPRAZOLE SODIUM 40 MG PO TBEC
40.0000 mg | DELAYED_RELEASE_TABLET | Freq: Every day | ORAL | Status: DC
  Administered 2023-08-06 – 2023-08-09 (×4): 40 mg via ORAL
  Filled 2023-08-05 (×4): qty 1

## 2023-08-05 MED ORDER — ATORVASTATIN CALCIUM 80 MG PO TABS
80.0000 mg | ORAL_TABLET | Freq: Every day | ORAL | Status: DC
  Administered 2023-08-06 – 2023-08-09 (×4): 80 mg via ORAL
  Filled 2023-08-05: qty 1
  Filled 2023-08-05: qty 2
  Filled 2023-08-05 (×2): qty 1

## 2023-08-05 MED ORDER — ACETAMINOPHEN 160 MG/5ML PO SOLN: 650.0000 mg | ORAL | Status: DC | PRN

## 2023-08-05 MED ORDER — STROKE: EARLY STAGES OF RECOVERY BOOK
Freq: Once | Status: AC
  Filled 2023-08-05: qty 1

## 2023-08-05 MED ORDER — SODIUM CHLORIDE 0.9% FLUSH
3.0000 mL | Freq: Once | INTRAVENOUS | Status: AC
  Administered 2023-08-06: 3 mL via INTRAVENOUS

## 2023-08-05 MED ORDER — ACETAMINOPHEN 325 MG PO TABS
650.0000 mg | ORAL_TABLET | ORAL | Status: DC | PRN
  Administered 2023-08-07: 650 mg via ORAL
  Filled 2023-08-05: qty 2

## 2023-08-05 MED ORDER — ENOXAPARIN SODIUM 40 MG/0.4ML IJ SOSY
40.0000 mg | PREFILLED_SYRINGE | Freq: Every day | INTRAMUSCULAR | Status: DC
  Administered 2023-08-06 – 2023-08-09 (×4): 40 mg via SUBCUTANEOUS
  Filled 2023-08-05 (×4): qty 0.4

## 2023-08-05 MED ORDER — UMECLIDINIUM BROMIDE 62.5 MCG/ACT IN AEPB
1.0000 | INHALATION_SPRAY | Freq: Every day | RESPIRATORY_TRACT | Status: DC
Start: 2023-08-06 — End: 2023-08-09
  Administered 2023-08-07 – 2023-08-09 (×3): 1 via RESPIRATORY_TRACT
  Filled 2023-08-05: qty 7

## 2023-08-05 MED ORDER — IOHEXOL 350 MG/ML SOLN
100.0000 mL | Freq: Once | INTRAVENOUS | Status: AC | PRN
  Administered 2023-08-05: 100 mL via INTRAVENOUS

## 2023-08-05 MED ORDER — SENNOSIDES-DOCUSATE SODIUM 8.6-50 MG PO TABS: 1.0000 | ORAL_TABLET | Freq: Every evening | ORAL | Status: DC | PRN

## 2023-08-05 MED ORDER — ACETAMINOPHEN 650 MG RE SUPP: 650.0000 mg | RECTAL | Status: DC | PRN

## 2023-08-05 MED ORDER — IPRATROPIUM-ALBUTEROL 0.5-2.5 (3) MG/3ML IN SOLN
3.0000 mL | RESPIRATORY_TRACT | Status: DC | PRN
Start: 2023-08-05 — End: 2023-08-09
  Administered 2023-08-06 – 2023-08-07 (×2): 3 mL via RESPIRATORY_TRACT
  Filled 2023-08-05 (×2): qty 3

## 2023-08-05 MED ORDER — ASPIRIN 81 MG PO TBEC
81.0000 mg | DELAYED_RELEASE_TABLET | Freq: Every day | ORAL | Status: DC
  Administered 2023-08-06 – 2023-08-09 (×4): 81 mg via ORAL
  Filled 2023-08-05 (×4): qty 1

## 2023-08-05 NOTE — ED Provider Notes (Addendum)
 [place] EMERGENCY DEPARTMENT AT [facility] Provider Note   CSN: [id number] Arrival date & time: 08/05/23  2132  An emergency department physician performed an initial assessment on this suspected stroke patient at 2141.  History  Chief Complaint  Patient presents with   Code Stroke    [name] is a 78 y.o. male.  Patient arrives with strokelike symptoms.  Really hard to nail down the last known normal.  It sounds like maybe 7 PM last night but originally thought maybe 1 PM this afternoon.  I talked with the son on the phone who noticed that he was having slurred speech weakness in the left side did not seem like himself.  He had a similar event Friday night that resolved and he was perfectly fine yesterday.  He has been in rehab for PT after having his left hip replaced last week.  Patient has history of head bleed.  Not on blood thinners.  History of COPD on chronic oxygen .  History of AAA stroke hypertension high cholesterol.  He denies any chest pain shortness of breath.  Sounds like he was on the floor this morning.  Facility cannot really tell when his speech change.  When family got there at 1:00 he was not talking right was not moving his hand right but did not decide to call 911 until later tonight.  The history is provided by the patient and a relative.       Home Medications Prior to Admission medications   Medication Sig Start Date End Date Taking? Authorizing Provider  acetaminophen  (TYLENOL ) 325 MG tablet Take 2 tablets (650 mg total) by mouth every 6 (six) hours as needed for mild pain (or Fever >/= 101). 04/28/21   Elgergawy, Ardia Kraft, MD  albuterol  (PROVENTIL ) (2.5 MG/3ML) 0.083% nebulizer solution Take 2.5 mg by nebulization every 6 (six) hours as needed. 10/09/22   [provider]  albuterol  (VENTOLIN  HFA) 108 (90 Base) MCG/ACT inhaler Inhale 2 puffs into the lungs every 6 (six) hours as needed for wheezing. 04/07/21 11/30/24  Medina-Vargas,  Monina C, NP  aspirin  EC 81 MG tablet Take 81 mg by mouth daily.    [provider]  atorvastatin  (LIPITOR ) 80 MG tablet Take 1 tablet (80 mg total) by mouth daily. 06/28/23   Charlette Console, FNP  dapagliflozin  propanediol (FARXIGA ) 10 MG TABS tablet Take 1 tablet (10 mg total) by mouth daily. 06/28/23   Charlette Console, FNP  furosemide  (LASIX ) 20 MG tablet Take 1 tablet (20 mg total) by mouth daily. Take 40 mg (2 tablets) on Monday and Friday only. Take Lasix  20 mg (1 tablet) on the other days. 06/28/23   Hackney, Tina A, FNP  ipratropium-albuterol  (DUONEB) 0.5-2.5 (3) MG/3ML SOLN Use twice a day scheduled and every 4 hours as needed for shortness of breath and wheezing 07/07/23   Singh, Prashant K, MD  midodrine  (PROAMATINE ) 5 MG tablet Take 1 tablet (5 mg total) by mouth 2 (two) times daily with a meal. 07/11/23   Singh, Prashant K, MD  Multiple Vitamins-Minerals (MULTIVITAMIN WITH MINERALS) tablet Take 1 tablet by mouth daily.    [provider]  omeprazole  (PRILOSEC) 40 MG capsule TAKE 1 CAPSULE BY MOUTH DAILY USUALLY BEFORE BREAKFAST 04/07/21   Medina-Vargas, Monina C, NP  spironolactone  (ALDACTONE ) 25 MG tablet Take 0.5 tablets (12.5 mg total) by mouth daily. FOR CHRONIC COMBINED CHF 06/28/23   Shawnee Dellen A, FNP  tiotropium (SPIRIVA  HANDIHALER) 18 MCG inhalation  capsule Place 1 capsule (18 mcg total) into inhaler and inhale daily. 07/07/23 07/06/24  Cala Castleman, MD      Allergies    Lyrica [pregabalin]    Review of Systems   Review of Systems  Physical Exam Updated Vital Signs BP (!) 150/98   Pulse 63   Resp (!) 24   Wt 81.4 kg   SpO2 96%   BMI 24.33 kg/m  Physical Exam Vitals and nursing note reviewed.  Constitutional:      General: He is not in acute distress.    Appearance: He is well-developed. He is not ill-appearing.  HENT:     Head: Normocephalic and atraumatic.     Nose: Nose normal.     Mouth/Throat:     Mouth: Mucous membranes are  moist.  Eyes:     Extraocular Movements: Extraocular movements intact.     Conjunctiva/sclera: Conjunctivae normal.     Pupils: Pupils are equal, round, and reactive to light.  Cardiovascular:     Rate and Rhythm: Normal rate and regular rhythm.     Pulses: Normal pulses.     Heart sounds: No murmur heard. Pulmonary:     Effort: Pulmonary effort is normal. No respiratory distress.     Breath sounds: Normal breath sounds.  Abdominal:     General: Abdomen is flat.     Palpations: Abdomen is soft.     Tenderness: There is no abdominal tenderness.  Musculoskeletal:        General: No swelling.     Cervical back: Normal range of motion and neck supple.  Skin:    General: Skin is warm and dry.     Capillary Refill: Capillary refill takes less than 2 seconds.  Neurological:     Mental Status: He is alert.     Comments: Slurred speech, left facial droop, decrease LUE strength especially with grip strength, decreased LLE strength, normal sensation, normal strength otherwise  Psychiatric:        Mood and Affect: Mood normal.     ED Results / Procedures / Treatments   Labs (all labs ordered are listed, but only abnormal results are displayed) Labs Reviewed  CBC - Abnormal; Notable for the following components:      Result Value   RBC 4.14 (*)    Hemoglobin 12.8 (*)    All other components within normal limits  COMPREHENSIVE METABOLIC PANEL WITH GFR - Abnormal; Notable for the following components:   Glucose, Bld 139 (*)    BUN 25 (*)    Creatinine, Ser 1.33 (*)    Calcium  8.7 (*)    Albumin 2.6 (*)    AST 45 (*)    GFR, Estimated 54 (*)    All other components within normal limits  I-STAT CHEM 8, ED - Abnormal; Notable for the following components:   BUN 28 (*)    Creatinine, Ser 1.50 (*)    Glucose, Bld 133 (*)    Calcium , Ion 1.08 (*)    All other components within normal limits  CBG MONITORING, ED - Abnormal; Notable for the following components:   Glucose-Capillary 132  (*)    All other components within normal limits  I-STAT VENOUS BLOOD GAS, ED - Abnormal; Notable for the following components:   pH, Ven 7.450 (*)    pCO2, Ven 42.6 (*)    pO2, Ven 85 (*)    Bicarbonate 29.6 (*)    Acid-Base Excess 5.0 (*)    HCT 38.0 (*)  Hemoglobin 12.9 (*)    All other components within normal limits  PROTIME-INR  APTT  DIFFERENTIAL  ETHANOL    EKG EKG Interpretation Date/Time:  Sunday August 05 2023 21:38:13 EDT Ventricular Rate:  86 PR Interval:  181 QRS Duration:  104 QT Interval:  403 QTC Calculation: 482 R Axis:   -34  Text Interpretation: Sinus rhythm Supraventricular bigeminy Abnormal R-wave progression, early transition Inferior infarct, old Lateral leads are also involved Confirmed by Lowery Rue [phone]) on 08/05/2023 10:53:08 PM  Radiology CT HEAD CODE STROKE WO CONTRAST Addendum Date: 08/05/2023 ADDENDUM REPORT: 08/05/2023 22:36 ADDENDUM: Findings discussed with Dr. Lindzen via telephone at 10:33 p.m. Electronically Signed   By: Stevenson Elbe M.D.   On: 08/05/2023 22:36   Result Date: 08/05/2023 CLINICAL DATA:  Code stroke.  Neuro deficit, acute, stroke suspected EXAM: CT HEAD WITHOUT CONTRAST TECHNIQUE: Contiguous axial images were obtained from the base of the skull through the vertex without intravenous contrast. RADIATION DOSE REDUCTION: This exam was performed according to the departmental dose-optimization program which includes automated exposure control, adjustment of the mA and/or kV according to patient size and/or use of iterative reconstruction technique. COMPARISON:  CT head April 12, 25. FINDINGS: Brain: Similar encephalomalacia in the right frontal, parietal, occipital and temporal lobes. Similar patchy white matter hypodensities. Similar remote left cerebellar infarct. New area of low attenuation and loss of gray white differentiation in the perirolandic posterior right frontal lobe, suspicious for acute infarct. No evidence of  acute hemorrhage, midline shift, mass lesion or hydrocephalus. Vascular: No hyperdense vessel. Skull: No acute fracture. Sinuses/Orbits: Clear sinuses.  No acute orbital findings. ASPECTS (Alberta Stroke Program Early CT Score) - Ganglionic level infarction (caudate, lentiform nuclei, internal capsule, insula, M1-M3 cortex): 7 - Supraganglionic infarction (M4-M6 cortex): 1-2 Total score (0-10 with 10 being normal): 8-9 Dr. Lindzen has been paged at the time of dictation for call of report. IMPRESSION: 1. Findings suspicious for acute infarct in the perirolandic posterior right frontal lobe, suspicious for acute infarct. ASPECTS is 8-9. 2. No acute hemorrhage. 3. Similar extensive remote infarcts. Electronically Signed: By: Stevenson Elbe M.D. On: 08/05/2023 22:05   CT ANGIO HEAD NECK W WO CM W PERF (CODE STROKE) Result Date: 08/05/2023 CLINICAL DATA:  Neuro deficit, acute, stroke suspected EXAM: CT ANGIOGRAPHY HEAD AND NECK CT PERFUSION BRAIN TECHNIQUE: Multidetector CT imaging of the head and neck was performed using the standard protocol during bolus administration of intravenous contrast. Multiplanar CT image reconstructions and MIPs were obtained to evaluate the vascular anatomy. Carotid stenosis measurements (when applicable) are obtained utilizing NASCET criteria, using the distal internal carotid diameter as the denominator. Multiphase CT imaging of the brain was performed following IV bolus contrast injection. Subsequent parametric perfusion maps were calculated using RAPID software. RADIATION DOSE REDUCTION: This exam was performed according to the departmental dose-optimization program which includes automated exposure control, adjustment of the mA and/or kV according to patient size and/or use of iterative reconstruction technique. CONTRAST:  OMNIPAQUE  IOHEXOL  350 MG/ML SOLN COMPARISON:  Same day CT head. FINDINGS: CTA NECK FINDINGS Aortic arch: Aortic atherosclerosis. Great vessel origins are  incompletely imaged. Visualized great vessel origins are patent. Moderate left subclavian artery stenosis. Right carotid system: Atherosclerosis at the carotid bifurcation involving the proximal ICA with approximately 30% stenosis relative to the distal vessel. Left carotid system: Common carotid artery origin is not imaged. Atherosclerosis at the carotid bifurcation without greater than 50% stenosis. Vertebral arteries: Severe left vertebral artery origin stenosis. Mildly  diminished opacification of the more distal vertebral artery. Right vertebral arteries patent without greater than 50% stenosis. Skeleton: No acute abnormality on limited assessment. Other neck: No acute abnormality on limited assessment. Upper chest: Visualized lung apices are clear. Review of the MIP images confirms the above findings CTA HEAD FINDINGS Anterior circulation: Hypoplastic right A1 ACA, likely congenital. Otherwise, bilateral the intracranial ICAs, MCAs, and ACAs are patent without proximal high-grade stenosis. Posterior circulation: Diminished opacification of the left intradural vertebral artery. Moderate left and mild right intradural vertebral artery stenosis due to atherosclerosis. The basilar artery and bilateral posterior cerebral arteries are patent. Severe left P2 PCA stenosis. Venous sinuses: Not well assessed arterial timing. Review of the MIP images confirms the above findings CT Brain Perfusion Findings: CBF (<30%) Volume: 0mL Perfusion (Tmax>6.0s) volume: 4mL Mismatch Volume: 4mL Infarction Location:No core infarct identified. IMPRESSION: CTA: 1. No emergent large vessel occlusion. 2. Severe left P2 PCA stenosis. 3. Severe left vertebral artery origin stenosis with diminished opacification of the more distal vertebral artery. 4. Moderate left intradural vertebral artery stenosis. 5. Moderate left subclavian artery stenosis. 6. Aortic Atherosclerosis (ICD10-I70.0). CT perfusion: 1. RAPID reports approximately 4 mL of  mismatch/penumbra in the posterior right frontal area, which correlates with findings on CT head. 2. No evidence of core infarct by CT perfusion. MRI could further evaluate. Findings discussed with Dr. Lindzen via telephone at 10:33 p.m. Electronically Signed   By: Stevenson Elbe M.D.   On: 08/05/2023 22:36    Procedures .Critical Care  Performed by: Lowery Rue, DO Authorized by: Lowery Rue, DO   Critical care provider statement:    Critical care time (minutes):  35   Critical care was necessary to treat or prevent imminent or life-threatening deterioration of the following conditions:  CNS failure or compromise   Critical care was time spent personally by me on the following activities:  Blood draw for specimens, development of treatment plan with patient or surrogate, discussions with primary provider, discussions with consultants, evaluation of patient's response to treatment, examination of patient, obtaining history from patient or surrogate, ordering and performing treatments and interventions, ordering and review of laboratory studies, pulse oximetry, ordering and review of radiographic studies, re-evaluation of patient's condition and review of old charts   Care discussed with: admitting provider       Medications Ordered in ED Medications  sodium chloride  flush (NS) 0.9 % injection 3 mL (has no administration in time range)  iohexol  (OMNIPAQUE ) 350 MG/ML injection 100 mL (100 mLs Intravenous Contrast Given 08/05/23 2213)    ED Course/ Medical Decision Making/ A&P                                 Medical Decision Making Amount and/or Complexity of Data Reviewed Labs: ordered. Radiology: ordered.  Risk Prescription drug management. Decision regarding hospitalization.   [name] is here with strokelike symptoms.  Last known normal after getting some more information likely 7 PM last night but code stroke was activated as last known normal was maybe 1 PM today  which was about 9 hours ago.  He has left-sided facial droop left arm weakness left lower leg weakness slurred speech.  History of head bleed and stroke with no deficits from those.  He just had his left hip replaced recently.  He is been at skilled nursing facility.  Dr. Lindzen with neurology came down to evaluate.  Head CT was  negative for head bleed.  CTA negative for LVO.  But findings are suspicious for acute infarct in the posterior right frontal lobe.  Overall lab work was unremarkable.  No significant leukocytosis anemia or electrolyte abnormality.  Patient was not a TNK candidate.  Not an LVO thrombectomy candidate.  Ultimately decision was held to hold those things.  Overall we will admit for further stroke care will get MRI will admit to medicine.  EKG shows sinus rhythm.  No ischemic changes.  Overall suspect patient has had acute stroke.  Will admit to medicine for further care.  I reviewed interpreted labs and imaging.  This chart was dictated using voice recognition software.  Despite best efforts to proofread,  errors can occur which can change the documentation meaning.         Final Clinical Impression(s) / ED Diagnoses Final diagnoses:  Stroke-like symptoms  Cerebrovascular accident (CVA), unspecified mechanism [facility])    Rx / DC Orders ED Discharge Orders     None         Lowery Rue, DO 08/05/23 2303    Lowery Rue, DO 08/05/23 2303

## 2023-08-05 NOTE — ED Triage Notes (Addendum)
 Pt arrived from [facility] for stroke like symptoms. Baseline is A&Ox4 and ambulatory. Recent left hip surgery and pneumonia. EMS called after family noticed left sided deficits (left sided weakness, facial droop, slurred speech). Family reported the last time they seen the patient at baseline was on Saturday at 7pm. Staff reported noticing pt was aphasic and c/o headache at 1430. At 1630, staff gave pt tylenol . EMS VS 124/80, pulse 81, cbg 138. EMS also reports having to remove food from mouth on arrival. On arrival, pt with left sided weakness, R gaze, facial droop, slurred speech. Dr.Curatolo called to bedside for code stroke

## 2023-08-05 NOTE — H&P (Incomplete)
 History and Physical    [name]:[id number] DOB: 26-Feb-1946 DOA: 08/05/2023  PCP: Sari Cunning, MD   Patient coming from: SNF   Chief Complaint: Left-side weakness, speech difficulty   HPI: [name] is a 78 y.o. male with medical history significant for hypertension, hyperlipidemia, COPD, chronic hypoxic respiratory failure, AAA status post endovascular repair, CAD, HFmrEF, and left hip fracture status post hemiarthroplasty on 07/28/2023 who now presents with left-sided weakness and speech difficulty.  The patient is accompanied by his sons who report that the patient had an episode of lethargy and speech difficulty on 08/03/2023, but he seemed to be back to his usual state yesterday.  Today, family noted that he was having difficulty speaking again, was weak on his left side, and had food that he was given hours earlier pocketed away in his cheek.   ED Course: Upon arrival to the ED, patient is found to be slightly tachypneic with normal HR and elevated BP.  EKG demonstrates sinus rhythm with PACs.  Chest x-ray is negative for acute findings.  Head CT is suspicious for acute infarct in perirolandic posterior right frontal lobe.  Labs are most notable for creatinine 1.32, albumin 2.6, and normal WBC.  Patient was evaluated by neurology in the ED as a code stroke.  MRI brain has been ordered.  Review of Systems:  ROS limited by patient's clinical condition.  Past Medical History:  Diagnosis Date  . AAA (abdominal aortic aneurysm) (HCC)    a.  CTA 7/19: measured 4.5 x 5.3 cm and greatest transverse dimensions  . Abnormal nuclear stress test    a.  Myoview  2012: anterior wall ischemia with an estimated EF of 26%.  This was a new wall motion abnormality as well as a newly reduced EF  . Brain bleed (HCC)   . COPD (chronic obstructive pulmonary disease) (HCC)   . Coronary artery disease    a.  Posterior MI in 2002 status post BMS to the LCx; b. LHC 2012: 95% stenosis of the  proximal LAD, 95% stenosis of the mid LAD, 30% in-stent restenosis of the mid left circumflex with a second lesion of diffuse 50% stenosis.  The patient underwent successful PCI/BMS to the mid LAD with 0% residual stenosis, LV gram not performed  . GERD (gastroesophageal reflux disease)   . History of kidney stones   . History of prostate cancer   . Hyperlipidemia   . Hypertension   . Myocardial infarction (HCC)   . Neuromuscular disorder (HCC)   . Prostate cancer (HCC)    a.  Status post seeding  . PVD (peripheral vascular disease) (HCC)   . Stroke [facility])     Past Surgical History:  Procedure Laterality Date  . BACK SURGERY  2009/2010   x 2  . BOWEL RESECTION N/A 03/18/2021   Procedure: SMALL BOWEL RESECTION;  Surgeon: Anda Bamberg, MD;  Location: [facility] OR;  Service: General;  Laterality: N/A;  . CARDIAC CATHETERIZATION  2002   stent placement [place]  . ENDOVASCULAR REPAIR/STENT GRAFT N/A 01/02/2018   Procedure: ENDOVASCULAR REPAIR/STENT GRAFT;  Surgeon: Celso College, MD;  Location: [facility] INVASIVE CV LAB;  Service: Cardiovascular;  Laterality: N/A;  . FOOT SURGERY     bilateral   . HERNIA REPAIR     bilateral   . HIP ARTHROPLASTY Left 07/28/2023   Procedure: LEFT HIP HEMIARTHROPLASTY;  Surgeon: Marilee Showers, MD;  Location: [facility] OR;  Service: Orthopedics;  Laterality: Left;  .  INGUINAL HERNIA REPAIR Left 03/18/2021   Procedure: INGUINAL HERNIA REPAIR;  Surgeon: Anda Bamberg, MD;  Location: [facility] OR;  Service: General;  Laterality: Left;  . INSERTION OF MESH Left 03/18/2021   Procedure: INSERTION OF MESH;  Surgeon: Anda Bamberg, MD;  Location: [facility] OR;  Service: General;  Laterality: Left;  . KNEE SURGERY     right knee   . PROSTATE SURGERY  09/2009   prostate implant  . SPINE SURGERY    . UMBILICAL HERNIA REPAIR N/A 03/18/2021   Procedure: UMBILICAL HERNIA REPAIR;  Surgeon: Anda Bamberg, MD;  Location: [facility] OR;  Service: General;  Laterality: N/A;    Social History:    reports that he has been smoking cigarettes. He has a 14 pack-year smoking history. He has never used smokeless tobacco. He reports that he does not drink alcohol and does not use drugs.  Allergies  Allergen Reactions  . Lyrica [Pregabalin] Swelling         Family History  Problem Relation Age of Onset  . Heart attack Mother   . Diabetes Mother   . Heart disease Father   . Lung cancer Daughter      Prior to Admission medications   Medication Sig Start Date End Date Taking? Authorizing Provider  acetaminophen  (TYLENOL ) 325 MG tablet Take 2 tablets (650 mg total) by mouth every 6 (six) hours as needed for mild pain (or Fever >/= 101). 04/28/21   Elgergawy, Ardia Kraft, MD  albuterol  (PROVENTIL ) (2.5 MG/3ML) 0.083% nebulizer solution Take 2.5 mg by nebulization every 6 (six) hours as needed. 10/09/22   [provider]  albuterol  (VENTOLIN  HFA) 108 (90 Base) MCG/ACT inhaler Inhale 2 puffs into the lungs every 6 (six) hours as needed for wheezing. 04/07/21 11/30/24  Medina-Vargas, Monina C, NP  aspirin  EC 81 MG tablet Take 81 mg by mouth daily.    [provider]  atorvastatin  (LIPITOR ) 80 MG tablet Take 1 tablet (80 mg total) by mouth daily. 06/28/23   Charlette Console, FNP  dapagliflozin  propanediol (FARXIGA ) 10 MG TABS tablet Take 1 tablet (10 mg total) by mouth daily. 06/28/23   Charlette Console, FNP  furosemide  (LASIX ) 20 MG tablet Take 1 tablet (20 mg total) by mouth daily. Take 40 mg (2 tablets) on Monday and Friday only. Take Lasix  20 mg (1 tablet) on the other days. 06/28/23   Hackney, Tina A, FNP  ipratropium-albuterol  (DUONEB) 0.5-2.5 (3) MG/3ML SOLN Use twice a day scheduled and every 4 hours as needed for shortness of breath and wheezing 07/07/23   Cala Castleman, MD  midodrine  (PROAMATINE ) 5 MG tablet Take 1 tablet (5 mg total) by mouth 2 (two) times daily with a meal. 07/11/23   Singh, Prashant K, MD  Multiple Vitamins-Minerals (MULTIVITAMIN WITH MINERALS) tablet Take 1  tablet by mouth daily.    [provider]  omeprazole  (PRILOSEC) 40 MG capsule TAKE 1 CAPSULE BY MOUTH DAILY USUALLY BEFORE BREAKFAST 04/07/21   Medina-Vargas, Monina C, NP  spironolactone  (ALDACTONE ) 25 MG tablet Take 0.5 tablets (12.5 mg total) by mouth daily. FOR CHRONIC COMBINED CHF 06/28/23   Shawnee Dellen A, FNP  tiotropium (SPIRIVA  HANDIHALER) 18 MCG inhalation capsule Place 1 capsule (18 mcg total) into inhaler and inhale daily. 07/07/23 07/06/24  Cala Castleman, MD    Physical Exam: Vitals:   08/05/23 2215 08/05/23 2219 08/05/23 2230  BP: (!) 148/79  (!) 150/98  Pulse: 71  63  Resp: (!) 21  Aaron Aas)  24  Temp: 98.3 F (36.8 C)    TempSrc: Oral    SpO2: 95%  96%  Weight:  81.4 kg      Constitutional: NAD, calm  Eyes: PERTLA, lids and conjunctivae normal ENMT: Mucous membranes are moist. Posterior pharynx clear of any exudate or lesions.   Neck: supple, no masses  Respiratory: clear to auscultation bilaterally, no wheezing, no crackles. No accessory muscle use.  Cardiovascular: S1 & S2 heard, regular rate and rhythm. No extremity edema. No significant JVD. Abdomen: No distension, no tenderness, soft. Bowel sounds active.  Musculoskeletal: no clubbing / cyanosis. No joint deformity upper and lower extremities.   Skin: no significant rashes, lesions, ulcers. Warm, dry, well-perfused. Neurologic: CN 2-12 grossly intact. Sensation intact, DTR normal. Strength 5/5 in all 4 limbs. Alert and oriented.  Psychiatric: Pleasant. Cooperative.    Labs and Imaging on Admission: I have personally reviewed following labs and imaging studies  CBC: Recent Labs  Lab 07/30/23 0252 07/31/23 0440 08/01/23 0608 08/05/23 2141 08/05/23 2143 08/05/23 2154  WBC 10.4 9.4 8.6 9.4  --   --   NEUTROABS 8.3* 7.6 6.7 6.8  --   --   HGB 11.7* 11.8* 11.8* 12.8* 13.3 12.9*  HCT 35.9* 36.1* 36.1* 39.9 39.0 38.0*  MCV 94.7 94.5 95.0 96.4  --   --   PLT 162 159 185 310  --   --     Basic Metabolic Panel: Recent Labs  Lab 07/30/23 0252 07/31/23 0440 08/01/23 0608 08/05/23 2141 08/05/23 2143 08/05/23 2154  NA 139 141 141 140 141 142  K 3.9 4.0 3.8 3.8 3.9 3.8  CL 104 105 102 105 105  --   CO2 28 28 26 26   --   --   GLUCOSE 116* 91 83 139* 133*  --   BUN 26* 23 36* 25* 28*  --   CREATININE 1.49* 1.15 1.44* 1.33* 1.50*  --   CALCIUM  8.3* 8.4* 8.5* 8.7*  --   --    GFR: Estimated Creatinine Clearance: 43.8 mL/min (A) (by C-G formula based on SCr of 1.5 mg/dL (H)). Liver Function Tests: Recent Labs  Lab 08/05/23 2141  AST 45*  ALT 28  ALKPHOS 89  BILITOT 1.2  PROT 6.7  ALBUMIN 2.6*   No results for input(s): "LIPASE", "AMYLASE" in the last 168 hours. No results for input(s): "AMMONIA" in the last 168 hours. Coagulation Profile: Recent Labs  Lab 08/05/23 2141  INR 1.1   Cardiac Enzymes: No results for input(s): "CKTOTAL", "CKMB", "CKMBINDEX", "TROPONINI" in the last 168 hours. BNP (last 3 results) No results for input(s): "PROBNP" in the last 8760 hours. HbA1C: No results for input(s): "HGBA1C" in the last 72 hours. CBG: Recent Labs  Lab 08/05/23 2140  GLUCAP 132*   Lipid Profile: No results for input(s): "CHOL", "HDL", "LDLCALC", "TRIG", "CHOLHDL", "LDLDIRECT" in the last 72 hours. Thyroid  Function Tests: No results for input(s): "TSH", "T4TOTAL", "FREET4", "T3FREE", "THYROIDAB" in the last 72 hours. Anemia Panel: No results for input(s): "VITAMINB12", "FOLATE", "FERRITIN", "TIBC", "IRON", "RETICCTPCT" in the last 72 hours. Urine analysis:    Component Value Date/Time   COLORURINE YELLOW 07/04/2023 1032   APPEARANCEUR HAZY (A) 07/04/2023 1032   APPEARANCEUR Cloudy 04/21/2011 1418   LABSPEC 1.018 07/04/2023 1032   LABSPEC 1.013 04/21/2011 1418   PHURINE 5.0 07/04/2023 1032   GLUCOSEU >=500 (A) 07/04/2023 1032   GLUCOSEU Negative 04/21/2011 1418   HGBUR NEGATIVE 07/04/2023 1032   BILIRUBINUR NEGATIVE 07/04/2023 1032  BILIRUBINUR Negative 04/21/2011 1418   KETONESUR NEGATIVE 07/04/2023 1032   PROTEINUR NEGATIVE 07/04/2023 1032   NITRITE NEGATIVE 07/04/2023 1032   LEUKOCYTESUR NEGATIVE 07/04/2023 1032   LEUKOCYTESUR Negative 04/21/2011 1418   Sepsis Labs: @LABRCNTIP (procalcitonin:4,lacticidven:4) ) Recent Results (from the past 240 hours)  Surgical pcr screen     Status: None   Collection Time: 07/28/23  8:12 AM   Specimen: Nasal Mucosa; Nasal Swab  Result Value Ref Range Status   MRSA, PCR NEGATIVE NEGATIVE Final   Staphylococcus aureus NEGATIVE NEGATIVE Final    Comment: (NOTE) The Xpert SA Assay (FDA approved for NASAL specimens in patients 41 years of age and older), is one component of a comprehensive surveillance program. It is not intended to diagnose infection nor to guide or monitor treatment. Performed at [facility], [address], [place], [place] [address]      Radiological Exams on Admission: DG Chest Portable 1 View Result Date: 08/05/2023 CLINICAL DATA:  Cough code stroke EXAM: PORTABLE CHEST 1 VIEW COMPARISON:  07/28/2023 FINDINGS: Mildly diminished lung volumes. No consolidation, pleural effusion or pneumothorax. Stable cardiomediastinal silhouette with aortic atherosclerosis. No pneumothorax IMPRESSION: No active disease. Mildly diminished lung volumes. Electronically Signed   By: Esmeralda Hedge M.D.   On: 08/05/2023 23:12   CT HEAD CODE STROKE WO CONTRAST Addendum Date: 08/05/2023 ADDENDUM REPORT: 08/05/2023 22:36 ADDENDUM: Findings discussed with Dr. Lindzen via telephone at 10:33 p.m. Electronically Signed   By: Stevenson Elbe M.D.   On: 08/05/2023 22:36   Result Date: 08/05/2023 CLINICAL DATA:  Code stroke.  Neuro deficit, acute, stroke suspected EXAM: CT HEAD WITHOUT CONTRAST TECHNIQUE: Contiguous axial images were obtained from the base of the skull through the vertex without intravenous contrast. RADIATION DOSE REDUCTION: This exam was performed according to the  departmental dose-optimization program which includes automated exposure control, adjustment of the mA and/or kV according to patient size and/or use of iterative reconstruction technique. COMPARISON:  CT head April 12, 25. FINDINGS: Brain: Similar encephalomalacia in the right frontal, parietal, occipital and temporal lobes. Similar patchy white matter hypodensities. Similar remote left cerebellar infarct. New area of low attenuation and loss of gray white differentiation in the perirolandic posterior right frontal lobe, suspicious for acute infarct. No evidence of acute hemorrhage, midline shift, mass lesion or hydrocephalus. Vascular: No hyperdense vessel. Skull: No acute fracture. Sinuses/Orbits: Clear sinuses.  No acute orbital findings. ASPECTS ([place]ta Stroke Program Early CT Score) - Ganglionic level infarction (caudate, lentiform nuclei, internal capsule, insula, M1-M3 cortex): 7 - Supraganglionic infarction (M4-M6 cortex): 1-2 Total score (0-10 with 10 being normal): 8-9 Dr. Lindzen has been paged at the time of dictation for call of report. IMPRESSION: 1. Findings suspicious for acute infarct in the perirolandic posterior right frontal lobe, suspicious for acute infarct. ASPECTS is 8-9. 2. No acute hemorrhage. 3. Similar extensive remote infarcts. Electronically Signed: By: Stevenson Elbe M.D. On: 08/05/2023 22:05   CT ANGIO HEAD NECK W WO CM W PERF (CODE STROKE) Result Date: 08/05/2023 CLINICAL DATA:  Neuro deficit, acute, stroke suspected EXAM: CT ANGIOGRAPHY HEAD AND NECK CT PERFUSION BRAIN TECHNIQUE: Multidetector CT imaging of the head and neck was performed using the standard protocol during bolus administration of intravenous contrast. Multiplanar CT image reconstructions and MIPs were obtained to evaluate the vascular anatomy. Carotid stenosis measurements (when applicable) are obtained utilizing NASCET criteria, using the distal internal carotid diameter as the denominator. Multiphase CT  imaging of the brain was performed following IV bolus contrast  injection. Subsequent parametric perfusion maps were calculated using RAPID software. RADIATION DOSE REDUCTION: This exam was performed according to the departmental dose-optimization program which includes automated exposure control, adjustment of the mA and/or kV according to patient size and/or use of iterative reconstruction technique. CONTRAST:  OMNIPAQUE  IOHEXOL  350 MG/ML SOLN COMPARISON:  Same day CT head. FINDINGS: CTA NECK FINDINGS Aortic arch: Aortic atherosclerosis. Great vessel origins are incompletely imaged. Visualized great vessel origins are patent. Moderate left subclavian artery stenosis. Right carotid system: Atherosclerosis at the carotid bifurcation involving the proximal ICA with approximately 30% stenosis relative to the distal vessel. Left carotid system: Common carotid artery origin is not imaged. Atherosclerosis at the carotid bifurcation without greater than 50% stenosis. Vertebral arteries: Severe left vertebral artery origin stenosis. Mildly diminished opacification of the more distal vertebral artery. Right vertebral arteries patent without greater than 50% stenosis. Skeleton: No acute abnormality on limited assessment. Other neck: No acute abnormality on limited assessment. Upper chest: Visualized lung apices are clear. Review of the MIP images confirms the above findings CTA HEAD FINDINGS Anterior circulation: Hypoplastic right A1 ACA, likely congenital. Otherwise, bilateral the intracranial ICAs, MCAs, and ACAs are patent without proximal high-grade stenosis. Posterior circulation: Diminished opacification of the left intradural vertebral artery. Moderate left and mild right intradural vertebral artery stenosis due to atherosclerosis. The basilar artery and bilateral posterior cerebral arteries are patent. Severe left P2 PCA stenosis. Venous sinuses: Not well assessed arterial timing. Review of the MIP images  confirms the above findings CT Brain Perfusion Findings: CBF (<30%) Volume: 0mL Perfusion (Tmax>6.0s) volume: 4mL Mismatch Volume: 4mL Infarction Location:No core infarct identified. IMPRESSION: CTA: 1. No emergent large vessel occlusion. 2. Severe left P2 PCA stenosis. 3. Severe left vertebral artery origin stenosis with diminished opacification of the more distal vertebral artery. 4. Moderate left intradural vertebral artery stenosis. 5. Moderate left subclavian artery stenosis. 6. Aortic Atherosclerosis (ICD10-I70.0). CT perfusion: 1. RAPID reports approximately 4 mL of mismatch/penumbra in the posterior right frontal area, which correlates with findings on CT head. 2. No evidence of core infarct by CT perfusion. MRI could further evaluate. Findings discussed with Dr. Lindzen via telephone at 10:33 p.m. Electronically Signed   By: Stevenson Elbe M.D.   On: 08/05/2023 22:36    EKG: Independently reviewed. ***  Assessment/Plan Principal Problem:   Acute ischemic stroke (HCC) Active Problems:   COPD (chronic obstructive pulmonary disease) (HCC)   Essential hypertension   Chronic hypoxic respiratory failure (HCC)   History of stroke   Chronic systolic CHF (congestive heart failure) (HCC)   History of CAD (coronary artery disease)    1. Acute ischemic CVA  - ***  -  -   2. Chronic HFmrEF  - Looks to be on dry side and does not appear that he will pass a swallow screen (will be NPO for now) - Hold diuretics, monitor weight and I/Os    3.  -  -  -  -  -  -  -   4.  -  -  -  -  -  -  -  -   5.  -  -  -  -  -  -  -   6.  -  -  -  -  -   7.  -  -  -  -  -  -   8.  -  -  -  -  -  -     DVT prophylaxis:  Lovenox   Code Status: DNR, paperwork at bedside  Level of Care: Level of care: Telemetry Medical Family Communication: Sons at bedside   Disposition Plan:  Patient is from: SNF  Anticipated d/c is to: SNF  Anticipated d/c date is: 08/07/23   Patient currently: Pending stroke workup  Consults called: Neurology  Admission status: Inpatient     Walton Guppy, MD [facility]  08/05/2023, 11:24 PM

## 2023-08-05 NOTE — H&P (Signed)
 History and Physical    [name] ZOX:[id number] DOB: 1945-06-16 DOA: 08/05/2023  PCP: Sari Cunning, MD   Patient coming from: SNF   Chief Complaint: Left-side weakness, speech difficulty   HPI: [name] is a 78 y.o. male with medical history significant for hypertension, hyperlipidemia, COPD, chronic hypoxic respiratory failure, AAA status post endovascular repair, CAD, HFmrEF, and left hip fracture status post hemiarthroplasty on 07/28/2023 who now presents with left-sided weakness and speech difficulty.  The patient is accompanied by his sons who report that the patient had an episode of lethargy and speech difficulty on 08/03/2023, but he seemed to be back to his usual state yesterday.  Today, family noted that he was having difficulty speaking again, was weak on his left side, and had food that he was given hours earlier pocketed away in his cheek.   ED Course: Upon arrival to the ED, patient is found to be slightly tachypneic with normal HR and elevated BP.  EKG demonstrates sinus rhythm with PACs.  Chest x-ray is negative for acute findings.  Head CT is suspicious for acute infarct in perirolandic posterior right frontal lobe.  Labs are most notable for creatinine 1.32, albumin 2.6, and normal WBC.  Patient was evaluated by neurology in the ED as a code stroke.  MRI brain has been ordered.  Review of Systems:  ROS limited by patient's clinical condition.  Past Medical History:  Diagnosis Date   AAA (abdominal aortic aneurysm) (HCC)    a.  CTA 7/19: measured 4.5 x 5.3 cm and greatest transverse dimensions   Abnormal nuclear stress test    a.  Myoview  2012: anterior wall ischemia with an estimated EF of 26%.  This was a new wall motion abnormality as well as a newly reduced EF   Brain bleed (HCC)    COPD (chronic obstructive pulmonary disease) (HCC)    Coronary artery disease    a.  Posterior MI in 2002 status post BMS to the LCx; b. LHC 2012: 95% stenosis of the  proximal LAD, 95% stenosis of the mid LAD, 30% in-stent restenosis of the mid left circumflex with a second lesion of diffuse 50% stenosis.  The patient underwent successful PCI/BMS to the mid LAD with 0% residual stenosis, LV gram not performed   GERD (gastroesophageal reflux disease)    History of kidney stones    History of prostate cancer    Hyperlipidemia    Hypertension    Myocardial infarction [facility])    Neuromuscular disorder (HCC)    Prostate cancer (HCC)    a.  Status post seeding   PVD (peripheral vascular disease) (HCC)    Stroke [facility])     Past Surgical History:  Procedure Laterality Date   BACK SURGERY  2009/2010   x 2   BOWEL RESECTION N/A 03/18/2021   Procedure: SMALL BOWEL RESECTION;  Surgeon: Anda Bamberg, MD;  Location: [facility] OR;  Service: General;  Laterality: N/A;   CARDIAC CATHETERIZATION  2002   stent placement [place]   ENDOVASCULAR REPAIR/STENT GRAFT N/A 01/02/2018   Procedure: ENDOVASCULAR REPAIR/STENT GRAFT;  Surgeon: Celso College, MD;  Location: [facility] INVASIVE CV LAB;  Service: Cardiovascular;  Laterality: N/A;   FOOT SURGERY     bilateral    HERNIA REPAIR     bilateral    HIP ARTHROPLASTY Left 07/28/2023   Procedure: LEFT HIP HEMIARTHROPLASTY;  Surgeon: Marilee Showers, MD;  [facility] OR;  Service: Orthopedics;  Laterality: Left;  INGUINAL HERNIA REPAIR Left 03/18/2021   Procedure: INGUINAL HERNIA REPAIR;  Surgeon: Anda Bamberg, MD;  Location: [facility] OR;  Service: General;  Laterality: Left;   INSERTION OF MESH Left 03/18/2021   Procedure: INSERTION OF MESH;  Surgeon: Anda Bamberg, MD;  Location: [facility] OR;  Service: General;  Laterality: Left;   KNEE SURGERY     right knee    PROSTATE SURGERY  09/2009   prostate implant   SPINE SURGERY     UMBILICAL HERNIA REPAIR N/A 03/18/2021   Procedure: UMBILICAL HERNIA REPAIR;  Surgeon: Anda Bamberg, MD;  Location: [facility] OR;  Service: General;  Laterality: N/A;    Social History:   reports that he has been  smoking cigarettes. He has a 14 pack-year smoking history. He has never used smokeless tobacco. He reports that he does not drink alcohol and does not use drugs.  Allergies  Allergen Reactions   Lyrica [Pregabalin] Swelling         Family History  Problem Relation Age of Onset   Heart attack Mother    Diabetes Mother    Heart disease Father    Lung cancer Daughter      Prior to Admission medications   Medication Sig Start Date End Date Taking? Authorizing Provider  acetaminophen  (TYLENOL ) 325 MG tablet Take 2 tablets (650 mg total) by mouth every 6 (six) hours as needed for mild pain (or Fever >/= 101). 04/28/21   Elgergawy, Ardia Kraft, MD  albuterol  (PROVENTIL ) (2.5 MG/3ML) 0.083% nebulizer solution Take 2.5 mg by nebulization every 6 (six) hours as needed. 10/09/22   [provider]  albuterol  (VENTOLIN  HFA) 108 (90 Base) MCG/ACT inhaler Inhale 2 puffs into the lungs every 6 (six) hours as needed for wheezing. 04/07/21 11/30/24  Medina-Vargas, Monina C, NP  aspirin  EC 81 MG tablet Take 81 mg by mouth daily.    [provider]  atorvastatin  (LIPITOR ) 80 MG tablet Take 1 tablet (80 mg total) by mouth daily. 06/28/23   Charlette Console, FNP  dapagliflozin  propanediol (FARXIGA ) 10 MG TABS tablet Take 1 tablet (10 mg total) by mouth daily. 06/28/23   Charlette Console, FNP  furosemide  (LASIX ) 20 MG tablet Take 1 tablet (20 mg total) by mouth daily. Take 40 mg (2 tablets) on Monday and Friday only. Take Lasix  20 mg (1 tablet) on the other days. 06/28/23   Hackney, Tina A, FNP  ipratropium-albuterol  (DUONEB) 0.5-2.5 (3) MG/3ML SOLN Use twice a day scheduled and every 4 hours as needed for shortness of breath and wheezing 07/07/23   Cala Castleman, MD  midodrine  (PROAMATINE ) 5 MG tablet Take 1 tablet (5 mg total) by mouth 2 (two) times daily with a meal. 07/11/23   Singh, Prashant K, MD  Multiple Vitamins-Minerals (MULTIVITAMIN WITH MINERALS) tablet Take 1 tablet by mouth daily.     [provider]  omeprazole  (PRILOSEC) 40 MG capsule TAKE 1 CAPSULE BY MOUTH DAILY USUALLY BEFORE BREAKFAST 04/07/21   Medina-Vargas, Monina C, NP  spironolactone  (ALDACTONE ) 25 MG tablet Take 0.5 tablets (12.5 mg total) by mouth daily. FOR CHRONIC COMBINED CHF 06/28/23   Shawnee Dellen A, FNP  tiotropium (SPIRIVA  HANDIHALER) 18 MCG inhalation capsule Place 1 capsule (18 mcg total) into inhaler and inhale daily. 07/07/23 07/06/24  Cala Castleman, MD    Physical Exam: Vitals:   08/05/23 2215 08/05/23 2219 08/05/23 2230  BP: (!) 148/79  (!) 150/98  Pulse: 71  63  Resp: (!) 21  Aaron Aas)  24  Temp: 98.3 F (36.8 C)    TempSrc: Oral    SpO2: 95%  96%  Weight:  81.4 kg     Constitutional: NAD, no pallor or diaphoresis   Eyes: PERTLA, lids and conjunctivae normal ENMT: Mucous membranes are dry. Posterior pharynx clear of any exudate or lesions.   Neck: supple, no masses  Respiratory: no wheezing, no crackles. No accessory muscle use.  Cardiovascular: S1 & S2 heard, regular rate and rhythm. No extremity edema.  Abdomen: No distension, no tenderness, soft. Bowel sounds active.  Musculoskeletal: no clubbing / cyanosis. No joint deformity upper and lower extremities.   Skin: no significant rashes, lesions, ulcers. Warm, dry, well-perfused. Neurologic: Dysarthria, right gaze preference. Strength 5/5 on the right, 4/5 involving LLE, and 3/5 throughout LUE. Alert and oriented to person, place, and situation.  Psychiatric: Calm. Cooperative.    Labs and Imaging on Admission: I have personally reviewed following labs and imaging studies  CBC: Recent Labs  Lab 07/30/23 0252 07/31/23 0440 08/01/23 0608 08/05/23 2141 08/05/23 2143 08/05/23 2154  WBC 10.4 9.4 8.6 9.4  --   --   NEUTROABS 8.3* 7.6 6.7 6.8  --   --   HGB 11.7* 11.8* 11.8* 12.8* 13.3 12.9*  HCT 35.9* 36.1* 36.1* 39.9 39.0 38.0*  MCV 94.7 94.5 95.0 96.4  --   --   PLT 162 159 185 310  --   --    Basic Metabolic  Panel: Recent Labs  Lab 07/30/23 0252 07/31/23 0440 08/01/23 0608 08/05/23 2141 08/05/23 2143 08/05/23 2154  NA 139 141 141 140 141 142  K 3.9 4.0 3.8 3.8 3.9 3.8  CL 104 105 102 105 105  --   CO2 28 28 26 26   --   --   GLUCOSE 116* 91 83 139* 133*  --   BUN 26* 23 36* 25* 28*  --   CREATININE 1.49* 1.15 1.44* 1.33* 1.50*  --   CALCIUM  8.3* 8.4* 8.5* 8.7*  --   --    GFR: Estimated Creatinine Clearance: 43.8 mL/min (A) (by C-G formula based on SCr of 1.5 mg/dL (H)). Liver Function Tests: Recent Labs  Lab 08/05/23 2141  AST 45*  ALT 28  ALKPHOS 89  BILITOT 1.2  PROT 6.7  ALBUMIN 2.6*   No results for input(s): "LIPASE", "AMYLASE" in the last 168 hours. No results for input(s): "AMMONIA" in the last 168 hours. Coagulation Profile: Recent Labs  Lab 08/05/23 2141  INR 1.1   Cardiac Enzymes: No results for input(s): "CKTOTAL", "CKMB", "CKMBINDEX", "TROPONINI" in the last 168 hours. BNP (last 3 results) No results for input(s): "PROBNP" in the last 8760 hours. HbA1C: No results for input(s): "HGBA1C" in the last 72 hours. CBG: Recent Labs  Lab 08/05/23 2140  GLUCAP 132*   Lipid Profile: No results for input(s): "CHOL", "HDL", "LDLCALC", "TRIG", "CHOLHDL", "LDLDIRECT" in the last 72 hours. Thyroid  Function Tests: No results for input(s): "TSH", "T4TOTAL", "FREET4", "T3FREE", "THYROIDAB" in the last 72 hours. Anemia Panel: No results for input(s): "VITAMINB12", "FOLATE", "FERRITIN", "TIBC", "IRON", "RETICCTPCT" in the last 72 hours. Urine analysis:    Component Value Date/Time   COLORURINE YELLOW 07/04/2023 1032   APPEARANCEUR HAZY (A) 07/04/2023 1032   APPEARANCEUR Cloudy 04/21/2011 1418   LABSPEC 1.018 07/04/2023 1032   LABSPEC 1.013 04/21/2011 1418   PHURINE 5.0 07/04/2023 1032   GLUCOSEU >=500 (A) 07/04/2023 1032   GLUCOSEU Negative 04/21/2011 1418   HGBUR NEGATIVE 07/04/2023 1032   BILIRUBINUR  NEGATIVE 07/04/2023 1032   BILIRUBINUR Negative  04/21/2011 1418   KETONESUR NEGATIVE 07/04/2023 1032   PROTEINUR NEGATIVE 07/04/2023 1032   NITRITE NEGATIVE 07/04/2023 1032   LEUKOCYTESUR NEGATIVE 07/04/2023 1032   LEUKOCYTESUR Negative 04/21/2011 1418   Sepsis Labs: @LABRCNTIP (procalcitonin:4,lacticidven:4) ) Recent Results (from the past 240 hours)  Surgical pcr screen     Status: None   Collection Time: 07/28/23  8:12 AM   Specimen: Nasal Mucosa; Nasal Swab  Result Value Ref Range Status   MRSA, PCR NEGATIVE NEGATIVE Final   Staphylococcus aureus NEGATIVE NEGATIVE Final    Comment: (NOTE) The Xpert SA Assay (FDA approved for NASAL specimens in patients 31 years of age and older), is one component of a comprehensive surveillance program. It is not intended to diagnose infection nor to guide or monitor treatment. Performed at [facility], [address] [place], [place] [address]      Radiological Exams on Admission: DG Chest Portable 1 View Result Date: 08/05/2023 CLINICAL DATA:  Cough code stroke EXAM: PORTABLE CHEST 1 VIEW COMPARISON:  07/28/2023 FINDINGS: Mildly diminished lung volumes. No consolidation, pleural effusion or pneumothorax. Stable cardiomediastinal silhouette with aortic atherosclerosis. No pneumothorax IMPRESSION: No active disease. Mildly diminished lung volumes. Electronically Signed   By: Esmeralda Hedge M.D.   On: 08/05/2023 23:12   CT HEAD CODE STROKE WO CONTRAST Addendum Date: 08/05/2023 ADDENDUM REPORT: 08/05/2023 22:36 ADDENDUM: Findings discussed with Dr. Lindzen via telephone at 10:33 p.m. Electronically Signed   By: Stevenson Elbe M.D.   On: 08/05/2023 22:36   Result Date: 08/05/2023 CLINICAL DATA:  Code stroke.  Neuro deficit, acute, stroke suspected EXAM: CT HEAD WITHOUT CONTRAST TECHNIQUE: Contiguous axial images were obtained from the base of the skull through the vertex without intravenous contrast. RADIATION DOSE REDUCTION: This exam was performed according to the departmental  dose-optimization program which includes automated exposure control, adjustment of the mA and/or kV according to patient size and/or use of iterative reconstruction technique. COMPARISON:  CT head April 12, 25. FINDINGS: Brain: Similar encephalomalacia in the right frontal, parietal, occipital and temporal lobes. Similar patchy white matter hypodensities. Similar remote left cerebellar infarct. New area of low attenuation and loss of gray white differentiation in the perirolandic posterior right frontal lobe, suspicious for acute infarct. No evidence of acute hemorrhage, midline shift, mass lesion or hydrocephalus. Vascular: No hyperdense vessel. Skull: No acute fracture. Sinuses/Orbits: Clear sinuses.  No acute orbital findings. ASPECTS ([place]berta Stroke Program Early CT Score) - Ganglionic level infarction (caudate, lentiform nuclei, internal capsule, insula, M1-M3 cortex): 7 - Supraganglionic infarction (M4-M6 cortex): 1-2 Total score (0-10 with 10 being normal): 8-9 Dr. Lindzen has been paged at the time of dictation for call of report. IMPRESSION: 1. Findings suspicious for acute infarct in the perirolandic posterior right frontal lobe, suspicious for acute infarct. ASPECTS is 8-9. 2. No acute hemorrhage. 3. Similar extensive remote infarcts. Electronically Signed: By: Stevenson Elbe M.D. On: 08/05/2023 22:05   CT ANGIO HEAD NECK W WO CM W PERF (CODE STROKE) Result Date: 08/05/2023 CLINICAL DATA:  Neuro deficit, acute, stroke suspected EXAM: CT ANGIOGRAPHY HEAD AND NECK CT PERFUSION BRAIN TECHNIQUE: Multidetector CT imaging of the head and neck was performed using the standard protocol during bolus administration of intravenous contrast. Multiplanar CT image reconstructions and MIPs were obtained to evaluate the vascular anatomy. Carotid stenosis measurements (when applicable) are obtained utilizing NASCET criteria, using the distal internal carotid diameter as the denominator. Multiphase CT imaging of the  brain  was performed following IV bolus contrast injection. Subsequent parametric perfusion maps were calculated using RAPID software. RADIATION DOSE REDUCTION: This exam was performed according to the departmental dose-optimization program which includes automated exposure control, adjustment of the mA and/or kV according to patient size and/or use of iterative reconstruction technique. CONTRAST:  OMNIPAQUE  IOHEXOL  350 MG/ML SOLN COMPARISON:  Same day CT head. FINDINGS: CTA NECK FINDINGS Aortic arch: Aortic atherosclerosis. Great vessel origins are incompletely imaged. Visualized great vessel origins are patent. Moderate left subclavian artery stenosis. Right carotid system: Atherosclerosis at the carotid bifurcation involving the proximal ICA with approximately 30% stenosis relative to the distal vessel. Left carotid system: Common carotid artery origin is not imaged. Atherosclerosis at the carotid bifurcation without greater than 50% stenosis. Vertebral arteries: Severe left vertebral artery origin stenosis. Mildly diminished opacification of the more distal vertebral artery. Right vertebral arteries patent without greater than 50% stenosis. Skeleton: No acute abnormality on limited assessment. Other neck: No acute abnormality on limited assessment. Upper chest: Visualized lung apices are clear. Review of the MIP images confirms the above findings CTA HEAD FINDINGS Anterior circulation: Hypoplastic right A1 ACA, likely congenital. Otherwise, bilateral the intracranial ICAs, MCAs, and ACAs are patent without proximal high-grade stenosis. Posterior circulation: Diminished opacification of the left intradural vertebral artery. Moderate left and mild right intradural vertebral artery stenosis due to atherosclerosis. The basilar artery and bilateral posterior cerebral arteries are patent. Severe left P2 PCA stenosis. Venous sinuses: Not well assessed arterial timing. Review of the MIP images confirms the above  findings CT Brain Perfusion Findings: CBF (<30%) Volume: 0mL Perfusion (Tmax>6.0s) volume: 4mL Mismatch Volume: 4mL Infarction Location:No core infarct identified. IMPRESSION: CTA: 1. No emergent large vessel occlusion. 2. Severe left P2 PCA stenosis. 3. Severe left vertebral artery origin stenosis with diminished opacification of the more distal vertebral artery. 4. Moderate left intradural vertebral artery stenosis. 5. Moderate left subclavian artery stenosis. 6. Aortic Atherosclerosis (ICD10-I70.0). CT perfusion: 1. RAPID reports approximately 4 mL of mismatch/penumbra in the posterior right frontal area, which correlates with findings on CT head. 2. No evidence of core infarct by CT perfusion. MRI could further evaluate. Findings discussed with Dr. Lindzen via telephone at 10:33 p.m. Electronically Signed   By: Stevenson Elbe M.D.   On: 08/05/2023 22:36    EKG: Independently reviewed. Sinus rhythm, PACs.   Assessment/Plan   1. Acute ischemic CVA  - CT findings suspicious for acute infarct involving posterior right frontal lobe   - Continue cardiac monitoring and frequent neuro checks, keep NPO pending swallow screen, check MRI brain, echocardiogram, A1c, and lipids, consult PT/OT/SLP, continue ASA and Lipitor , follow-up on further neurology recommendations    2. Chronic HFmrEF  - Looks to be on dry side and does not appear that he will pass a swallow screen (will be NPO for now) - Hold diuretics, monitor weight and I/Os    3. COPD; chronic hypoxic  respiratory failure  - Not in exacerbation on admission  - Continue Spiriva  and as-needed DuoNeb    4. CAD  - No anginal complaints  - Resume ASA and Lipitor  when able   5. Hypertension  - Permit HTN to 220/120 for now     DVT prophylaxis: Lovenox   Code Status: DNR, paperwork at bedside  Level of Care: Level of care: Telemetry Medical Family Communication: Sons at bedside   Disposition Plan:  Patient is from: SNF  Anticipated d/c is  to: SNF  Anticipated d/c date is: 08/07/23  Patient  currently: Pending stroke workup  Consults called: Neurology  Admission status: Inpatient     Walton Guppy, MD [facility]  08/05/2023, 11:24 PM

## 2023-08-05 NOTE — Code Documentation (Signed)
 Stroke Response Nurse Documentation Code Documentation  [name] is a 78 y.o. male arriving to [facility]  via [facility] on 4/20 with past medical hx of CAD with MI, HTN, prostate cancer, HLD, AAA, TBI and CVA. On No antithrombotic. Code stroke was activated by ED.   Patient from SNF where he was LKW at 1900 on 4/19 and now complaining of left sided weakness and difficulty speaking.   Stroke team at the bedside on patient arrival. Labs drawn and patient cleared for CT by Dr. Colonel Ramirez. Patient to CT with team. NIHSS 11, see documentation for details and code stroke times. Patient with disoriented, right gaze preference , left hemianopia, left facial droop, left arm weakness, left leg weakness, left decreased sensation, dysarthria , and left neglect on exam. The following imaging was completed:  CT Head, CTA, and CTP. Patient is not a candidate for IV Thrombolytic due to Outside of window. Patient {ACTION; IS/IS [id number]} a candidate for IR due to ***.   Care Plan: Neuro checks q2 hrs.    Bedside handoff with ED RN [name].    [name]  Rapid Response RN

## 2023-08-05 NOTE — Consult Note (Signed)
 NEUROLOGY CONSULT NOTE   Date of service: August 05, 2023 Patient Name: [name] MRN:  [id number] DOB:  December 18, 1945 Chief Complaint: Acute onset of left sided weakness.  Requesting Provider: Lowery Rue, DO  History of Present Illness  [name] is a 78 y.o. male with PMHx of AAA, prior traumatic ICH, prior stroke with chronic vision deficit, COPD, CAD, kidney stones, prostate cancer, HLD, HTN, PVD, recent PNA and recent left hip fracture s/p left ORIF last week, who presents to the ED from his rehab facility after new onset of left sided weakness and dysarthria. He was last seen normal by family at 7:00 PM on Saturday. When family came to visit him this morning, he was noted to not be speaking normally. Later in the day he also was noted to have left sided weakness and left facial droop. Staff reported noticing that the patient was aphasic and c/o headache at 1430. At 1630, staff gave the patient Tylenol . Family had called EMS to pick the patient up at the facility and bring him to the ED. EMS VS 124/80, pulse 81, cbg 138. EMS also reported having to remove food from mouth on arrival and noted that he was weak on the left, with right gaze preference, left facial droop and slurred speech. Code Stroke was called upon arrival to the ED. Staff at the facility were unable to provide more details regarding LKN requiring reliance upon family for the history. Due to some confusion and disorientation, the patient also was unable to provide a reliable LKN.   At his baseline prior to his hip fracture, he walked with a walker and was independent regarding his ADLs. Per family, he had a similar event Friday night that resolved and subsequently he was perfectly fine on Saturday.    Outpatient medications include ASA and atorvastatin .   LKW: 7:00 PM Saturday Modified rankin score: 2-Slight disability-UNABLE to perform all activities but does not need assistance (rated for level of functioning prior to  his recent hip fracture) IV Thrombolysis:  No: Out of the time window EVT: No: CTA negative for LVO  NIHSS components Score: Comment  1a Level of Conscious 0[]  1[x]  2[]  3[]     Requires some additional stimulation to attend  1b LOC Questions 0[]  1[x]  2[]      Incorrect month  1c LOC Commands 0[x]  1[]  2[]       2 Best Gaze 0[]  1[x]  2[]      Right gaze preference; able to cross midline to the left.   3 Visual 0[]  1[]  2[x]  3[]     Left hemianopsia  4 Facial Palsy 0[]  1[]  2[x]  3[]      5a Motor Arm - left 0[]  1[x]  2[]  3[]  4[]  UN[]   4-/5 against resistance, bradykinetic  5b Motor Arm - Right 0[x]  1[]  2[]  3[]  4[]  UN[]    6a Motor Leg - Left 0[]  1[x]  2[]  3[]  4[]  UN[]   4-/5 against resistance, bradykinetic  6b Motor Leg - Right 0[x]  1[]  2[]  3[]  4[]  UN[]    7 Limb Ataxia 0[x]  1[]  2[]  3[]  UN[]     8 Sensory 0[]  1[x]  2[]  UN[]     Decreased temp sensation to LLE.   9 Best Language 0[x]  1[]  2[]  3[]      10 Dysarthria 0[]  1[x]  2[]  UN[]      11 Extinct. and Inattention 0[]  1[x]  2[]      Extinction to DSS left face, arm and leg  TOTAL:   12    Discharge summary from 4/16 has been reviewed: "[name]  is a 79 y.o. male with medical history significant of chronic hypoxic respiratory failure 4 to 6 L oxygen  at home at baseline, CHF with reduced EF 40 to 45%, essential hypertension, CAD, hyperlipidemia, AAA status post endovascular repair, and COPD presented emergency department for evaluation for mechanical fall.  Patient reported that he accidentally fell on the bathroom landed on the left-sided hip and complaining about left-sided hip pain and pelvic pain. Patient admitted as above for acute hip fracture on the left from a mechanical fall.  Patient tolerated ORIF quite well 07/28/2023.  At this time continues to work with physical therapy recommending ongoing evaluation and therapy at skilled nursing facility.  Patient had notable postop anemia improved with transfusion, chronic history including heart failure and COPD  appears well-controlled on current regimen, no changes at this time.  He is otherwise stable and agreeable for discharge to facility for ongoing physical therapy evaluation and care prior to returning home which is his ultimate goal."   ROS  As per HPI. Detailed ROS deferred in the context of acuity of presentation.   Past History   Past Medical History:  Diagnosis Date   AAA (abdominal aortic aneurysm) (HCC)    a.  CTA 7/19: measured 4.5 x 5.3 cm and greatest transverse dimensions   Abnormal nuclear stress test    a.  Myoview  2012: anterior wall ischemia with an estimated EF of 26%.  This was a new wall motion abnormality as well as a newly reduced EF   Brain bleed (HCC)    COPD (chronic obstructive pulmonary disease) (HCC)    Coronary artery disease    a.  Posterior MI in 2002 status post BMS to the LCx; b. LHC 2012: 95% stenosis of the proximal LAD, 95% stenosis of the mid LAD, 30% in-stent restenosis of the mid left circumflex with a second lesion of diffuse 50% stenosis.  The patient underwent successful PCI/BMS to the mid LAD with 0% residual stenosis, LV gram not performed   GERD (gastroesophageal reflux disease)    History of kidney stones    History of prostate cancer    Hyperlipidemia    Hypertension    Myocardial infarction [facility])    Neuromuscular disorder (HCC)    Prostate cancer (HCC)    a.  Status post seeding   PVD (peripheral vascular disease) (HCC)    Stroke [facility])     Past Surgical History:  Procedure Laterality Date   BACK SURGERY  2009/2010   x 2   BOWEL RESECTION N/A 03/18/2021   Procedure: SMALL BOWEL RESECTION;  Surgeon: Anda Bamberg, MD;  Location: [facility] OR;  Service: General;  Laterality: N/A;   CARDIAC CATHETERIZATION  2002   stent placement    ENDOVASCULAR REPAIR/STENT GRAFT N/A 01/02/2018   Procedure: ENDOVASCULAR REPAIR/STENT GRAFT;  Surgeon: Celso College, MD;  Location: [facility] INVASIVE CV LAB;  Service: Cardiovascular;  Laterality: N/A;    FOOT SURGERY     bilateral    HERNIA REPAIR     bilateral    HIP ARTHROPLASTY Left 07/28/2023   Procedure: LEFT HIP HEMIARTHROPLASTY;  Surgeon: Marilee Showers, MD;  Location[facility] OR;  Service: Orthopedics;  Laterality: Left;   INGUINAL HERNIA REPAIR Left 03/18/2021   Procedure: INGUINAL HERNIA REPAIR;  Surgeon: Anda Bamberg, MD;  Location: [facility] OR;  Service: General;  Laterality: Left;   INSERTION OF MESH Left 03/18/2021   Procedure: INSERTION OF MESH;  Surgeon: Anda Bamberg, MD;  Location: [facility] OR;  Service: General;  Laterality: Left;   KNEE SURGERY     right knee    PROSTATE SURGERY  09/2009   prostate implant   SPINE SURGERY     UMBILICAL HERNIA REPAIR N/A 03/18/2021   Procedure: UMBILICAL HERNIA REPAIR;  Surgeon: Anda Bamberg, MD;  Location: [facility] OR;  Service: General;  Laterality: N/A;    Family History: Family History  Problem Relation Age of Onset   Heart attack Mother    Diabetes Mother    Heart disease Father    Lung cancer Daughter     Social History  reports that he has been smoking cigarettes. He has a 14 pack-year smoking history. He has never used smokeless tobacco. He reports that he does not drink alcohol and does not use drugs.  Allergies  Allergen Reactions   Lyrica [Pregabalin] Swelling         Medications   Current Facility-Administered Medications:    sodium chloride  flush (NS) 0.9 % injection 3 mL, 3 mL, Intravenous, Once, Curatolo, Adam, DO  Current Outpatient Medications:    acetaminophen  (TYLENOL ) 325 MG tablet, Take 2 tablets (650 mg total) by mouth every 6 (six) hours as needed for mild pain (or Fever >/= 101)., Disp: , Rfl:    albuterol  (PROVENTIL ) (2.5 MG/3ML) 0.083% nebulizer solution, Take 2.5 mg by nebulization every 6 (six) hours as needed., Disp: , Rfl:    albuterol  (VENTOLIN  HFA) 108 (90 Base) MCG/ACT inhaler, Inhale 2 puffs into the lungs every 6 (six) hours as needed for wheezing., Disp: 1 each, Rfl: 1   aspirin  EC 81 MG tablet,  Take 81 mg by mouth daily., Disp: , Rfl:    atorvastatin  (LIPITOR ) 80 MG tablet, Take 1 tablet (80 mg total) by mouth daily., Disp: 90 tablet, Rfl: 3   dapagliflozin  propanediol (FARXIGA ) 10 MG TABS tablet, Take 1 tablet (10 mg total) by mouth daily., Disp: 90 tablet, Rfl: 3   furosemide  (LASIX ) 20 MG tablet, Take 1 tablet (20 mg total) by mouth daily. Take 40 mg (2 tablets) on Monday and Friday only. Take Lasix  20 mg (1 tablet) on the other days., Disp: 108 tablet, Rfl: 3   ipratropium-albuterol  (DUONEB) 0.5-2.5 (3) MG/3ML SOLN, Use twice a day scheduled and every 4 hours as needed for shortness of breath and wheezing, Disp: 360 mL, Rfl: 0   midodrine  (PROAMATINE ) 5 MG tablet, Take 1 tablet (5 mg total) by mouth 2 (two) times daily with a meal., Disp: , Rfl:    Multiple Vitamins-Minerals (MULTIVITAMIN WITH MINERALS) tablet, Take 1 tablet by mouth daily., Disp: , Rfl:    omeprazole  (PRILOSEC) 40 MG capsule, TAKE 1 CAPSULE BY MOUTH DAILY USUALLY BEFORE BREAKFAST, Disp: 30 capsule, Rfl: 0   spironolactone  (ALDACTONE ) 25 MG tablet, Take 0.5 tablets (12.5 mg total) by mouth daily. FOR CHRONIC COMBINED CHF, Disp: 45 tablet, Rfl: 3   tiotropium (SPIRIVA  HANDIHALER) 18 MCG inhalation capsule, Place 1 capsule (18 mcg total) into inhaler and inhale daily., Disp: 30 capsule, Rfl: 0  Vitals  There were no vitals filed for this visit.  There is no height or weight on file to calculate BMI.  Physical Exam   Constitutional: Appears disheveled. Wearing depends. Well nourished.  Eyes: No scleral injection.  HENT: No OP obstruction.  Head: Normocephalic.  Respiratory: Effort normal, non-labored breathing.  Ext: Recent surgery to left hip.  Genitourinary: Wearing Depends.   Neurologic Examination   See NIHSS  Labs/Imaging/Neurodiagnostic studies   CBC:  Recent Labs  Lab 08/01/23 0608 08/05/23 2141 08/05/23 2143  WBC 8.6 9.4  --   NEUTROABS 6.7 6.8  --   HGB 11.8* 12.8* 13.3  HCT 36.1*  39.9 39.0  MCV 95.0 96.4  --   PLT 185 310  --    Basic Metabolic Panel:  Lab Results  Component Value Date   NA 141 08/05/2023   K 3.9 08/05/2023   CO2 26 08/01/2023   GLUCOSE 133 (H) 08/05/2023   BUN 28 (H) 08/05/2023   CREATININE 1.50 (H) 08/05/2023   CALCIUM  8.5 (L) 08/01/2023   GFRNONAA 49 (L) 08/01/2023   GFRAA >90 04/04/2021   Lipid Panel:  Lab Results  Component Value Date   LDLCALC 62 04/22/2018   HgbA1c:  Lab Results  Component Value Date   HGBA1C 5.5 02/21/2021   Urine Drug Screen: No results found for: "LABOPIA", "COCAINSCRNUR", "LABBENZ", "AMPHETMU", "THCU", "LABBARB"  Alcohol Level No results found for: "ETH" INR  Lab Results  Component Value Date   INR 1.1 07/04/2023   APTT  Lab Results  Component Value Date   APTT 28 07/04/2023   Recent TTE from 12/12/22: 1. Left ventricular ejection fraction, by estimation, is 40 to 45%. The  left ventricle has mildly decreased function. The left ventricle  demonstrates global hypokinesis. There is moderate left ventricular  hypertrophy. Left ventricular diastolic  parameters are consistent with Grade I diastolic dysfunction (impaired  relaxation).   2. Right ventricular systolic function is normal. The right ventricular  size is mildly enlarged. There is normal pulmonary artery systolic  pressure.   3. Left atrial size was mildly dilated.   4. Right atrial size was mildly dilated.   5. The mitral valve is grossly normal. Mild mitral valve regurgitation.  No evidence of mitral stenosis.   6. The aortic valve was not well visualized. Aortic valve regurgitation  is not visualized. No aortic stenosis is present.   7. Pulmonic valve regurgitation not well-assessed.   8. The inferior vena cava is normal in size with greater than 50%  respiratory variability, suggesting right atrial pressure of 3 mmHg.      ASSESSMENT  78 y.o. male with PMHx of AAA, prior traumatic ICH, prior stroke with chronic vision deficit,  COPD, CAD, kidney stones, prostate cancer, HLD, HTN, PVD, recent PNA and recent left hip fracture s/p left ORIF last week, who presents to the ED from his rehab facility after new onset of left sided weakness and dysarthria. He was last seen normal by family at 7:00 PM on Saturday. When family came to visit him this morning, he was noted to not be speaking normally. Later in the day he also was noted to have left sided weakness and left facial droop. Staff reported noticing that the patient was aphasic and c/o headache at 1430. At 1630, staff gave the patient Tylenol . Family had called EMS to pick the patient up at the facility and bring him to the ED. EMS VS 124/80, pulse 81, cbg 138. EMS also reported having to remove food from mouth on arrival and noted that he was weak on the left, with right gaze preference, left facial droop and slurred speech. Code Stroke was called upon arrival to the ED. Staff at the facility were unable to provide more details regarding LKN requiring reliance upon family for the history. Due to some confusion and disorientation, the patient also was unable to provide a reliable LKN. At his baseline prior to his hip fracture, he walked with a  walker and was independent regarding his ADLs. Per family, he had a similar event Friday night that resolved and subsequently he was perfectly fine on Saturday. Outpatient medications include ASA and atorvastatin . - Exam reveals findings referable to the right parietofrontal region and right occipital lobe.  - CT head: Findings suspicious for acute infarct in the perirolandic posterior right frontal lobe. ASPECTS is 8-9. No acute hemorrhage. Similar extensive remote infarcts. - CTA of head and neck: No emergent large vessel occlusion. Severe left P2 PCA stenosis. Severe left vertebral artery origin stenosis with diminished opacification of the more distal vertebral artery. Moderate left intradural vertebral artery stenosis. Moderate left subclavian  artery stenosis. Aortic atherosclerosis. - CTP: RAPID reports approximately 4 mL of mismatch/penumbra in the posterior right frontal area, which correlates with findings on CT head. No evidence of core infarct by CT perfusion.  - Labs: Glucose 139. Na and K normal. Ionized calcium  decreased at 1.08. BUN 28 and Cr 1.5 with eGFR of 54. WBC normal. Coags normal.  - EKG: Sinus rhythm; Supraventricular bigeminy; Abnormal R-wave progression, early transition Inferior infarct, old Lateral leads are also involved - IV Thrombolysis:  No: Out of the time window - EVT: No: CTA negative for LVO - Impression: Acute right MCA ischemic infarction with possible right PCA acute infarction. Will need full stroke work up to determine underlying etiology.    RECOMMENDATIONS  - IVF - Continue home atorvastatin  and ASA - Adding Plavix  as he is classifiable as having failed ASA monotherapy - MRI brain - Has had recent TTE, no need to repeat at this time, but may need TEE - Cardiac telemetry - HgbA1c, fasting lipid panel - PT consult, OT consult, Speech consult - Modified permissive HTN protocol given advanced age and frailty. Treat SBP if > 180.  - Risk factor modification - Frequent neuro checks - NPO until passes stroke swallow screen  ______________________________________________________________________    Hope Ly, Shamarion Coots, MD [facility]

## 2023-08-06 ENCOUNTER — Inpatient Hospital Stay (HOSPITAL_COMMUNITY)

## 2023-08-06 DIAGNOSIS — I6389 Other cerebral infarction: Secondary | ICD-10-CM | POA: Diagnosis not present

## 2023-08-06 DIAGNOSIS — R1310 Dysphagia, unspecified: Secondary | ICD-10-CM | POA: Diagnosis not present

## 2023-08-06 DIAGNOSIS — I639 Cerebral infarction, unspecified: Secondary | ICD-10-CM | POA: Diagnosis not present

## 2023-08-06 DIAGNOSIS — R29709 NIHSS score 9: Secondary | ICD-10-CM

## 2023-08-06 LAB — ECHOCARDIOGRAM COMPLETE
AR max vel: 2.91 cm2
AV Peak grad: 7.5 mmHg
Ao pk vel: 1.37 m/s
Area-P 1/2: 2.1 cm2
MV VTI: 2.54 cm2
Weight: 2871.27 [oz_av]

## 2023-08-06 LAB — CBC
HCT: 38.4 % — ABNORMAL LOW (ref 39.0–52.0)
Hemoglobin: 12.2 g/dL — ABNORMAL LOW (ref 13.0–17.0)
MCH: 31 pg (ref 26.0–34.0)
MCHC: 31.8 g/dL (ref 30.0–36.0)
MCV: 97.7 fL (ref 80.0–100.0)
Platelets: 280 10*3/uL (ref 150–400)
RBC: 3.93 MIL/uL — ABNORMAL LOW (ref 4.22–5.81)
RDW: 13.4 % (ref 11.5–15.5)
WBC: 8.1 10*3/uL (ref 4.0–10.5)
nRBC: 0 % (ref 0.0–0.2)

## 2023-08-06 LAB — LIPID PANEL
Cholesterol: 133 mg/dL (ref 0–200)
HDL: 29 mg/dL — ABNORMAL LOW (ref >40)
LDL Cholesterol: 76 mg/dL (ref 0–99)
Total CHOL/HDL Ratio: 4.6 ratio
Triglycerides: 140 mg/dL (ref <150)
VLDL: 28 mg/dL (ref 0–40)

## 2023-08-06 LAB — HEMOGLOBIN A1C
Hgb A1c MFr Bld: 5.2 % (ref 4.8–5.6)
Mean Plasma Glucose: 102.54 mg/dL

## 2023-08-06 MED ORDER — PERFLUTREN LIPID MICROSPHERE
1.0000 mL | INTRAVENOUS | Status: AC | PRN
  Administered 2023-08-06: 2 mL via INTRAVENOUS

## 2023-08-06 MED ORDER — HYDROCODONE-ACETAMINOPHEN 5-325 MG PO TABS
1.0000 | ORAL_TABLET | Freq: Four times a day (QID) | ORAL | Status: DC | PRN
  Administered 2023-08-06 – 2023-08-09 (×7): 1 via ORAL
  Filled 2023-08-06 (×7): qty 1

## 2023-08-06 MED ORDER — CLOPIDOGREL BISULFATE 75 MG PO TABS
75.0000 mg | ORAL_TABLET | Freq: Every day | ORAL | Status: DC
  Administered 2023-08-06 – 2023-08-09 (×4): 75 mg via ORAL
  Filled 2023-08-06 (×4): qty 1

## 2023-08-06 NOTE — ED Notes (Signed)
 Called and placed PT on monitor with CCMD.

## 2023-08-06 NOTE — Progress Notes (Addendum)
 STROKE TEAM PROGRESS NOTE   INTERIM HISTORY/SUBJECTIVE  Patient lying in bed, no family at bedside.  Lethargic but responsive, right gaze preference with no blink to threat on left.  Does follow simple commands.  Minimal verbal verbal output with dysarthric speech, weak left arm, withdraws left leg quite well  Strong suspicion for atrial fibrillation as possible etiology of stroke given MRI scan showing embolic patchy right MCA infarcts along with multiple old infarcts..  OBJECTIVE  CBC    Component Value Date/Time   WBC 8.1 08/06/2023 0320   RBC 3.93 (L) 08/06/2023 0320   HGB 12.2 (L) 08/06/2023 0320   HGB 14.4 12/19/2022 1039   HCT 38.4 (L) 08/06/2023 0320   HCT 43.8 12/19/2022 1039   PLT 280 08/06/2023 0320   PLT 200 12/19/2022 1039   MCV 97.7 08/06/2023 0320   MCV 96 12/19/2022 1039   MCV 93 02/27/2012 1406   MCH 31.0 08/06/2023 0320   MCHC 31.8 08/06/2023 0320   RDW 13.4 08/06/2023 0320   RDW 11.8 12/19/2022 1039   RDW 12.9 02/27/2012 1406   LYMPHSABS 1.2 08/05/2023 2141   LYMPHSABS 2.1 02/27/2012 1406   MONOABS 0.8 08/05/2023 2141   MONOABS 0.6 02/27/2012 1406   EOSABS 0.4 08/05/2023 2141   EOSABS 0.4 02/27/2012 1406   BASOSABS 0.1 08/05/2023 2141   BASOSABS 0.1 02/27/2012 1406    BMET    Component Value Date/Time   NA 142 08/05/2023 2154   NA 145 (H) 04/13/2023 1550   NA 145 04/23/2011 0252   K 3.8 08/05/2023 2154   K 3.9 04/23/2011 0252   CL 105 08/05/2023 2143   CL 107 04/23/2011 0252   CO2 26 08/05/2023 2141   CO2 31 04/23/2011 0252   GLUCOSE 133 (H) 08/05/2023 2143   GLUCOSE 93 04/23/2011 0252   BUN 28 (H) 08/05/2023 2143   BUN 15 04/13/2023 1550   BUN 16 04/23/2011 0252   CREATININE 1.50 (H) 08/05/2023 2143   CREATININE 1.12 10/03/2011 1625   CALCIUM  8.7 (L) 08/05/2023 2141   CALCIUM  8.6 04/23/2011 0252   EGFR 67 04/13/2023 1550   GFRNONAA 54 (L) 08/05/2023 2141   GFRNONAA 68 10/03/2011 1625    IMAGING past 24 hours ECHOCARDIOGRAM  COMPLETE Result Date: 08/06/2023    ECHOCARDIOGRAM REPORT   Patient Name:   [name] Date of Exam: 08/06/2023 Medical Rec #:  [id number]      Height:       72.0 in Accession #:    [id number]     Weight:       179.5 lb Date of Birth:  [birth year]      BSA:          2.035 m Patient Age:    77 years       BP:           118/76 mmHg Patient Gender: M              HR:           59 bpm. Exam Location:  Inpatient Procedure: 2D Echo, Color Doppler, Cardiac Doppler and Intracardiac            Opacification Agent (Both Spectral and Color Flow Doppler were            utilized during procedure). Indications:    Stroke  History:        Patient has prior history of Echocardiogram examinations.  Previous Myocardial Infarction, Stroke; Risk Factors:Current                 Smoker, Hypertension and HLD.  Sonographer:    Willey Harrier Referring Phys: [id number] TIMOTHY S OPYD  Sonographer Comments: Suboptimal parasternal window and Technically difficult study due to poor echo windows. IMPRESSIONS  1. Left ventricular ejection fraction, by estimation, is 30 to 35%. The left ventricle has moderately decreased function. The left ventricle demonstrates regional wall motion abnormalities with severe hypokinesis of the anterosepl, anterior, and inferior walls as well as the apex. No LV thrombus noted. Left ventricular diastolic parameters are consistent with Grade I diastolic dysfunction (impaired relaxation).  2. Right ventricular systolic function is mildly reduced. The right ventricular size is normal. There is normal pulmonary artery systolic pressure. The estimated right ventricular systolic pressure is 29.0 mmHg.  3. The mitral valve is normal in structure. Trivial mitral valve regurgitation. No evidence of mitral stenosis.  4. The aortic valve was not well visualized. Aortic valve regurgitation is trivial. No aortic stenosis is present.  5. The inferior vena cava is normal in size with greater than 50% respiratory  variability, suggesting right atrial pressure of 3 mmHg. FINDINGS  Left Ventricle: Left ventricular ejection fraction, by estimation, is 30 to 35%. The left ventricle has moderately decreased function. The left ventricle demonstrates regional wall motion abnormalities. Definity  contrast agent was given IV to delineate the left ventricular endocardial borders. The left ventricular internal cavity size was normal in size. There is no left ventricular hypertrophy. Left ventricular diastolic parameters are consistent with Grade I diastolic dysfunction (impaired relaxation). Right Ventricle: The right ventricular size is normal. No increase in right ventricular wall thickness. Right ventricular systolic function is mildly reduced. There is normal pulmonary artery systolic pressure. The tricuspid regurgitant velocity is 2.55 m/s, and with an assumed right atrial pressure of 3 mmHg, the estimated right ventricular systolic pressure is 29.0 mmHg. Left Atrium: Left atrial size was normal in size. Right Atrium: Right atrial size was normal in size. Pericardium: There is no evidence of pericardial effusion. Mitral Valve: The mitral valve is normal in structure. There is mild calcification of the mitral valve leaflet(s). Mild mitral annular calcification. Trivial mitral valve regurgitation. No evidence of mitral valve stenosis. MV peak gradient, 5.3 mmHg. The mean mitral valve gradient is 2.0 mmHg. Tricuspid Valve: The tricuspid valve is normal in structure. Tricuspid valve regurgitation is trivial. Aortic Valve: The aortic valve was not well visualized. Aortic valve regurgitation is trivial. No aortic stenosis is present. Aortic valve peak gradient measures 7.5 mmHg. Pulmonic Valve: The pulmonic valve was normal in structure. Pulmonic valve regurgitation is not visualized. Aorta: The aortic root is normal in size and structure. Venous: The inferior vena cava is normal in size with greater than 50% respiratory variability,  suggesting right atrial pressure of 3 mmHg. IAS/Shunts: No atrial level shunt detected by color flow Doppler.  LEFT VENTRICLE PLAX 2D LVOT diam:     2.10 cm   Diastology LV SV:         66        LV e' medial:    5.00 cm/s LV SV Index:   32        LV E/e' medial:  10.4 LVOT Area:     3.46 cm  LV e' lateral:   11.20 cm/s  LV E/e' lateral: 4.7  RIGHT VENTRICLE          IVC RV Basal diam:  4.30 cm  IVC diam: 1.30 cm LEFT ATRIUM             Index        RIGHT ATRIUM           Index LA Vol (A2C):   49.9 ml 24.53 ml/m  RA Area:     16.30 cm LA Vol (A4C):   50.4 ml 24.77 ml/m  RA Volume:   38.10 ml  18.73 ml/m LA Biplane Vol: 51.9 ml 25.51 ml/m  AORTIC VALVE AV Area (Vmax): 2.91 cm AV Vmax:        137.00 cm/s AV Peak Grad:   7.5 mmHg LVOT Vmax:      115.00 cm/s LVOT Vmean:     76.400 cm/s LVOT VTI:       0.190 m  AORTA Ao Root diam: 3.40 cm Ao Asc diam:  3.20 cm MITRAL VALVE                TRICUSPID VALVE MV Area (PHT): 2.10 cm     TR Peak grad:   26.0 mmHg MV Area VTI:   2.54 cm     TR Vmax:        255.00 cm/s MV Peak grad:  5.3 mmHg MV Mean grad:  2.0 mmHg     SHUNTS MV Vmax:       1.15 m/s     Systemic VTI:  0.19 m MV Vmean:      68.6 cm/s    Systemic Diam: 2.10 cm MV Decel Time: 361 msec MV E velocity: 52.10 cm/s MV A velocity: 110.00 cm/s MV E/A ratio:  0.47 Dalton McleanMD Electronically signed by Archer Bear Signature Date/Time: 08/06/2023/1:48:52 PM    Final    MR BRAIN WO CONTRAST Result Date: 08/06/2023 CLINICAL DATA:  78 year old male neurologic deficit. Code stroke presentation yesterday. Right perirolandic ischemia suspected on CT and CT P. EXAM: MRI HEAD WITHOUT CONTRAST TECHNIQUE: Multiplanar, multiecho pulse sequences of the brain and surrounding structures were obtained without intravenous contrast. COMPARISON:  Head CT, CTA, CTP yesterday. FINDINGS: Brain: Patchy, confluent 3 cm area of restricted diffusion in the right lateral motor strip, posterior frontal operculum  tracking toward the right upper extremity representation area. Faint additional pre motor diffusion restriction, and somewhat separate 1.5 cm area of confluent restricted diffusion in the anterior right corona radiata near the caudate. These correspond to the earlier CT findings. There is heterogeneous T2 and FLAIR hyperintense cytotoxic edema. No hemorrhagic transformation. No mass effect. Superimposed chronic encephalomalacia throughout the lateral right temporal lobe, posterior and lateral right occipital lobe, inferior right parietal lobe. Small area of chronic right inferior frontal gyrus encephalomalacia. Moderate T2 and FLAIR heterogeneity throughout the bilateral deep gray nuclei appears to be post ischemic also and there is a chronic microhemorrhage in the left thalamus. Chronic small left cerebellar infarct with encephalomalacia. Tiny chronic right cerebellar infarct. No midline shift, mass effect, evidence of mass lesion, ventriculomegaly, extra-axial collection or acute intracranial hemorrhage. Cervicomedullary junction and pituitary are within normal limits. Vascular: Major intracranial vascular flow voids are preserved. Skull and upper cervical spine: Negative. Visualized bone marrow signal is within normal limits. Sinuses/Orbits: Rightward gaze.  Paranasal sinuses well aerated. Other: Mild bilateral mastoid effusions. Trace retained secretions in the nasopharynx. IMPRESSION: 1. Confirmed patchy acute infarcts in the Right MCA territory, most confluent along the right lateral motor  strip. No hemorrhagic transformation or mass effect. 2. Underlying advanced chronic small and medium-sized vessel ischemic disease, possibly with some superimposed chronic posttraumatic encephalomalacia. Electronically Signed   By: Marlise Simpers M.D.   On: 08/06/2023 05:44   DG Chest Portable 1 View Result Date: 08/05/2023 CLINICAL DATA:  Cough code stroke EXAM: PORTABLE CHEST 1 VIEW COMPARISON:  07/28/2023 FINDINGS: Mildly  diminished lung volumes. No consolidation, pleural effusion or pneumothorax. Stable cardiomediastinal silhouette with aortic atherosclerosis. No pneumothorax IMPRESSION: No active disease. Mildly diminished lung volumes. Electronically Signed   By: Esmeralda Hedge M.D.   On: 08/05/2023 23:12   CT HEAD CODE STROKE WO CONTRAST Addendum Date: 08/05/2023 ADDENDUM REPORT: 08/05/2023 22:36 ADDENDUM: Findings discussed with Dr. Lindzen via telephone at 10:33 p.m. Electronically Signed   By: Stevenson Elbe M.D.   On: 08/05/2023 22:36   Result Date: 08/05/2023 CLINICAL DATA:  Code stroke.  Neuro deficit, acute, stroke suspected EXAM: CT HEAD WITHOUT CONTRAST TECHNIQUE: Contiguous axial images were obtained from the base of the skull through the vertex without intravenous contrast. RADIATION DOSE REDUCTION: This exam was performed according to the departmental dose-optimization program which includes automated exposure control, adjustment of the mA and/or kV according to patient size and/or use of iterative reconstruction technique. COMPARISON:  CT head April 12, 25. FINDINGS: Brain: Similar encephalomalacia in the right frontal, parietal, occipital and temporal lobes. Similar patchy white matter hypodensities. Similar remote left cerebellar infarct. New area of low attenuation and loss of gray white differentiation in the perirolandic posterior right frontal lobe, suspicious for acute infarct. No evidence of acute hemorrhage, midline shift, mass lesion or hydrocephalus. Vascular: No hyperdense vessel. Skull: No acute fracture. Sinuses/Orbits: Clear sinuses.  No acute orbital findings. ASPECTS ([place] Stroke Program Early CT Score) - Ganglionic level infarction (caudate, lentiform nuclei, internal capsule, insula, M1-M3 cortex): 7 - Supraganglionic infarction (M4-M6 cortex): 1-2 Total score (0-10 with 10 being normal): 8-9 Dr. Lindzen has been paged at the time of dictation for call of report. IMPRESSION: 1. Findings  suspicious for acute infarct in the perirolandic posterior right frontal lobe, suspicious for acute infarct. ASPECTS is 8-9. 2. No acute hemorrhage. 3. Similar extensive remote infarcts. Electronically Signed: By: Stevenson Elbe M.D. On: 08/05/2023 22:05   CT ANGIO HEAD NECK W WO CM W PERF (CODE STROKE) Result Date: 08/05/2023 CLINICAL DATA:  Neuro deficit, acute, stroke suspected EXAM: CT ANGIOGRAPHY HEAD AND NECK CT PERFUSION BRAIN TECHNIQUE: Multidetector CT imaging of the head and neck was performed using the standard protocol during bolus administration of intravenous contrast. Multiplanar CT image reconstructions and MIPs were obtained to evaluate the vascular anatomy. Carotid stenosis measurements (when applicable) are obtained utilizing NASCET criteria, using the distal internal carotid diameter as the denominator. Multiphase CT imaging of the brain was performed following IV bolus contrast injection. Subsequent parametric perfusion maps were calculated using RAPID software. RADIATION DOSE REDUCTION: This exam was performed according to the departmental dose-optimization program which includes automated exposure control, adjustment of the mA and/or kV according to patient size and/or use of iterative reconstruction technique. CONTRAST:  OMNIPAQUE  IOHEXOL  350 MG/ML SOLN COMPARISON:  Same day CT head. FINDINGS: CTA NECK FINDINGS Aortic arch: Aortic atherosclerosis. Great vessel origins are incompletely imaged. Visualized great vessel origins are patent. Moderate left subclavian artery stenosis. Right carotid system: Atherosclerosis at the carotid bifurcation involving the proximal ICA with approximately 30% stenosis relative to the distal vessel. Left carotid system: Common carotid artery origin is not imaged. Atherosclerosis at the  carotid bifurcation without greater than 50% stenosis. Vertebral arteries: Severe left vertebral artery origin stenosis. Mildly diminished opacification of the more  distal vertebral artery. Right vertebral arteries patent without greater than 50% stenosis. Skeleton: No acute abnormality on limited assessment. Other neck: No acute abnormality on limited assessment. Upper chest: Visualized lung apices are clear. Review of the MIP images confirms the above findings CTA HEAD FINDINGS Anterior circulation: Hypoplastic right A1 ACA, likely congenital. Otherwise, bilateral the intracranial ICAs, MCAs, and ACAs are patent without proximal high-grade stenosis. Posterior circulation: Diminished opacification of the left intradural vertebral artery. Moderate left and mild right intradural vertebral artery stenosis due to atherosclerosis. The basilar artery and bilateral posterior cerebral arteries are patent. Severe left P2 PCA stenosis. Venous sinuses: Not well assessed arterial timing. Review of the MIP images confirms the above findings CT Brain Perfusion Findings: CBF (<30%) Volume: 0mL Perfusion (Tmax>6.0s) volume: 4mL Mismatch Volume: 4mL Infarction Location:No core infarct identified. IMPRESSION: CTA: 1. No emergent large vessel occlusion. 2. Severe left P2 PCA stenosis. 3. Severe left vertebral artery origin stenosis with diminished opacification of the more distal vertebral artery. 4. Moderate left intradural vertebral artery stenosis. 5. Moderate left subclavian artery stenosis. 6. Aortic Atherosclerosis (ICD10-I70.0). CT perfusion: 1. RAPID reports approximately 4 mL of mismatch/penumbra in the posterior right frontal area, which correlates with findings on CT head. 2. No evidence of core infarct by CT perfusion. MRI could further evaluate. Findings discussed with Dr. Lindzen via telephone at 10:33 p.m. Electronically Signed   By: Stevenson Elbe M.D.   On: 08/05/2023 22:36    Vitals:   08/06/23 1030 08/06/23 1130 08/06/23 1142 08/06/23 1330  BP: (!) 152/136 (!) 130/109  125/79  Pulse: (!) 54 (!) 42  66  Resp: 19 18  (!) 23  Temp:   98.1 F (36.7 C)   TempSrc:    Oral   SpO2: 98% 98%  96%  Weight:         PHYSICAL EXAM General: Drowsy, poorly nourished-appearing, elderly patient CV: Regular rate and rhythm on monitor Respiratory:  Regular, unlabored respirations on room air GI: Abdomen soft and nontender   NEURO:  Mental Status: Responds to name, does not answer orientation questions. Does follow simple commands.  Speech/Language: Minimal verbal output, dysarthric.   Cranial Nerves:  II: PERRL. UTA VF.  III, IV, VI: Right gaze preference, does cross midline with intermittent tracking.  No blink to threat on left.  V: Sensation is intact to face. UTA symmetry of sensation.  VII: Left facial droop VIII: hearing intact to voice. IX, X: Palate elevates symmetrically. Phonation is normal.  YQ:MVHQIONG shrug 5/5. XII: tongue protrusion midline Motor:  Weak LUE, mild drift present Tone: is normal and bulk is decreased Sensation- Withdraws n all extremities, Less in LUE.  Coordination: uta/uncooperative with assessment.  Gait- deferred  Most Recent NIH: 9    ASSESSMENT/PLAN  [name] is a 78 y.o. male with PMHx of AAA, prior traumatic ICH, prior stroke with chronic vision deficit, COPD, CAD, kidney stones, prostate cancer, HLD, HTN, PVD, recent PNA and recent left hip fracture s/p left ORIF last week, who presents to the ED from his rehab facility after new onset of left sided weakness and dysarthria. Family had called EMS to pick the patient up at the facility and bring him to the ED. EMS VS 124/80, pulse 81, cbg 138. EMS also reported having to remove food from mouth on arrival and noted that he was weak  on the left, with right gaze preference, left facial droop and slurred speech. Code Stroke was called upon arrival to the ED. Staff at the facility were unable to provide more details regarding LKN requiring reliance upon family for the history. Due to some confusion and disorientation, the patient also was unable to provide a  reliable LKN. NIH on Admission: 11.  Acute Ischemic Infarcts: right MCA territory  Etiology:  Embolic likely Yes yeah--strong suspicion for Atrial Fibrillation  Code Stroke CT head:  Suspicious for acute infarct in the perirolandic posterior right frontal lobe Aspects 8-9 No acute hemorrhage Similar extensive remote infarcts CTA head & neck No emergent LVO Severe left P2 stenosis Severe left vertebral artery origin stenosis CT perfusion  4 mm of mismatch/penumbra in posterior right frontal area No evidence of core infarct MRI   Confirmed patchy acute infarcts in the Right MCA territory, most confluent along the right lateral motor strip.  No hemorrhagic transformation or mass effect. Underlying advanced chronic small and medium-sized vessel ischemic disease, possibly with some superimposed chronic posttraumatic encephalomalacia Chronic small left cerebellar infarct with encephalomalacia Chronic microhemorrhage left thalamus Tiny chronic right cerebellar infarct  2D Echo: LVEF 30 to 35%, severe hypokinesis LV, trivial MVR Recommend 30-day monitor LDL 76 HgbA1c 5.2 VTE prophylaxis - lovenox  aspirin  81 mg daily prior to admission, now on aspirin  81 mg daily and clopidogrel  75 mg daily for 3 weeks and then plavix  alone. Therapy recommendations:  SNF Disposition:  pending  Hx of Stroke/TIA 03/2021: Acute R temporal IPH 03/2021 Chronic R convexity SDH  Negative EEG On this admission's MRI: Chronic small left cerebellar infarct with encephalomalacia Chronic microhemorrhage left thalamus Tiny chronic right cerebellar infarct  Chronic HFrEF Hypertension Home meds: Lasix , spironolactone  Stable Blood Pressure Goal: BP less than 220/110 permissive hypertension for 24 to 48 hours from symptom onset, then gradually normalize Avoid hypotension Diuretics held Daily weights Strict I&Os  Hyperlipidemia Home meds: Lipitor  80 mg, resumed in hospital LDL 76, goal < 70 Continue  statin at discharge, encourage compliance/Facility administration?  Tobacco Abuse Patient is a current smoker      Ready to quit? N/A Nicotine  replacement therapy provided  Dysphagia Patient has post-stroke dysphagia, SLP consulted    Diet   Diet NPO time specified   Advance diet as tolerated  Other Stroke Risk Factors Coronary artery disease ASA nad Lipitor  home medications  Other Active Problems Hx of COPD and chronic hypoxic respiratory failure ? Malnutrition, Cachexia History of abdominal aortic aneurysm 2019 History of kidney stones History of prostate cancer  Hospital day # 1   Pt seen by Neuro NP/APP and later by MD. Note/plan to be edited by MD as needed.    Audrene Lease, DNP, AGACNP-BC [facility] Please use AMION for contact information & EPIC for messaging.  I have personally obtained history,examined this patient, reviewed notes, independently viewed imaging studies, participated in medical decision making and plan of care.ROS completed by me personally and pertinent positives fully documented  I have made any additions or clarifications directly to the above note. Agree with note above.  He presented with sudden onset of dysarthria and left-sided weakness and right gaze preference embolic right MCA infarct.  He has prior history of traumatic ICH and stroke with residual deficits and poor baseline functioning.  MRI scan shows several previous embolic strokes raising strong suspicion for atrial fibrillation.  Recommend continue ongoing workup check echocardiogram and recommend 30-day heart monitoring at discharge.  Aspirin  and Plavix  for  3 weeks followed by Plavix  alone and aggressive risk factor modification.  No family member at the bedside for discussion.  Greater than 50% time during this 50-minute visit spent in counseling and coordination of care and discussion with patient and care team and answering questions.  Ardella Beaver, MD Medical  Director [facility] Pager: [phone] 08/06/2023 4:21 PM  To contact Stroke Continuity provider, please refer to [email or web address]. After hours, contact General Neurology

## 2023-08-06 NOTE — Evaluation (Signed)
 Clinical/Bedside Swallow Evaluation Patient Details  Name: [name] MRN: [id number] Date of Birth: 07/07/1945  Today's Date: 08/06/2023 Time: SLP Start Time (ACUTE ONLY): 1350 SLP Stop Time (ACUTE ONLY): 1409 SLP Time Calculation (min) (ACUTE ONLY): 19 min  Past Medical History:  Past Medical History:  Diagnosis Date   AAA (abdominal aortic aneurysm) (HCC)    a.  CTA 7/19: measured 4.5 x 5.3 cm and greatest transverse dimensions   Abnormal nuclear stress test    a.  Myoview  2012: anterior wall ischemia with an estimated EF of 26%.  This was a new wall motion abnormality as well as a newly reduced EF   Brain bleed (HCC)    COPD (chronic obstructive pulmonary disease) (HCC)    Coronary artery disease    a.  Posterior MI in 2002 status post BMS to the LCx; b. LHC 2012: 95% stenosis of the proximal LAD, 95% stenosis of the mid LAD, 30% in-stent restenosis of the mid left circumflex with a second lesion of diffuse 50% stenosis.  The patient underwent successful PCI/BMS to the mid LAD with 0% residual stenosis, LV gram not performed   GERD (gastroesophageal reflux disease)    History of kidney stones    History of prostate cancer    Hyperlipidemia    Hypertension    Myocardial infarction [facility])    Neuromuscular disorder (HCC)    Prostate cancer (HCC)    a.  Status post seeding   PVD (peripheral vascular disease) (HCC)    Stroke [facility])    Past Surgical History:  Past Surgical History:  Procedure Laterality Date   BACK SURGERY  2009/2010   x 2   BOWEL RESECTION N/A 03/18/2021   Procedure: SMALL BOWEL RESECTION;  Surgeon: Anda Bamberg, MD;  Location: [facility] OR;  Service: General;  Laterality: N/A;   CARDIAC CATHETERIZATION  2002   stent placement [place]   ENDOVASCULAR REPAIR/STENT GRAFT N/A 01/02/2018   Procedure: ENDOVASCULAR REPAIR/STENT GRAFT;  Surgeon: Celso College, MD;  Location: [facility] INVASIVE CV LAB;  Service: Cardiovascular;  Laterality: N/A;   FOOT SURGERY     bilateral     HERNIA REPAIR     bilateral    HIP ARTHROPLASTY Left 07/28/2023   Procedure: LEFT HIP HEMIARTHROPLASTY;  Surgeon: Marilee Showers, MD;  Location: [facility] OR;  Service: Orthopedics;  Laterality: Left;   INGUINAL HERNIA REPAIR Left 03/18/2021   Procedure: INGUINAL HERNIA REPAIR;  Surgeon: Anda Bamberg, MD;  Location: [facility] OR;  Service: General;  Laterality: Left;   INSERTION OF MESH Left 03/18/2021   Procedure: INSERTION OF MESH;  Surgeon: Anda Bamberg, MD;  Location: [facility] OR;  Service: General;  Laterality: Left;   KNEE SURGERY     right knee    PROSTATE SURGERY  09/2009   prostate implant   SPINE SURGERY     UMBILICAL HERNIA REPAIR N/A 03/18/2021   Procedure: UMBILICAL HERNIA REPAIR;  Surgeon: Anda Bamberg, MD;  Location: [facility] OR;  Service: General;  Laterality: N/A;   HPI:  Pt is a 78 y.o. male who presented to ED on 04/20 due to left-sided weakness and trouble speaking. Pt was recently recovering from pneumonia and recent hip surgery. MRI displayed "patchy acute infarcts in the Right MCA territory, most  confluent along the right lateral motor strip." PMHx: hypertension, hyperlipidemia, COPD, chronic hypoxic respiratory failure, AAA status post endovascular repair, CAD, HFmrEF, and left hip fracture status post hemiarthroplasty on 07/28/2023    Assessment /  Plan / Recommendation  Clinical Impression  Pt was seen for bedside swallow eval to determine need for intervention. Pt's son was present for eval. Pt has a recent hx of pneumonia and hip surgery. Pt's son states that pt's eating habits have changed in the past couple weeks; pt is not eating as much; typically pt eats at least half of his plate.   Oral mech exam displayed reduced left side ROM and asymmetry. Tongue strength also appeared mildly reduced with slight thickness on left side. Pt's mouth was dry and required oral-cleansing by SLP. Pt exhibited immediate coughing during cup and straw sips of thin-liquids. Delayed coughing was  present during ice-chip trial. Pt's cough strength sounds weak. When presented graham cracker, pt held it in the front of his mouth and did not appear to attempt mastication. Cracker had to be cleared from pt's mouth.   Due to signs of aspiration and dysphagia, MBS is recommended to further assess pt's swallow function. Pt may have ice chips after oral care to promote oral hygiene and to support pt's wellbeing.  SLP Visit Diagnosis: Dysphagia, unspecified (R13.10)    Aspiration Risk  Moderate aspiration risk    Diet Recommendation Ice chips PRN after oral care;NPO except meds    Medication Administration: Whole meds with puree    Other  Recommendations Oral Care Recommendations: Oral care BID;Oral care prior to ice chip/H20;Staff/trained caregiver to provide oral care    Recommendations for follow up therapy are one component of a multi-disciplinary discharge planning process, led by the attending physician.  Recommendations may be updated based on patient status, additional functional criteria and insurance authorization.  Follow up Recommendations        Assistance Recommended at Discharge    Functional Status Assessment    Frequency and Duration            Prognosis        Swallow Study   General Date of Onset: 08/05/23 HPI: Pt is a 78 y.o. male who presented to ED on 04/20 due to left-sided weakness and trouble speaking. Pt was recently recovering from pneumonia and recent hip surgery. MRI displayed "patchy acute infarcts in the Right MCA territory, most  confluent along the right lateral motor strip." PMHx: hypertension, hyperlipidemia, COPD, chronic hypoxic respiratory failure, AAA status post endovascular repair, CAD, HFmrEF, and left hip fracture status post hemiarthroplasty on 07/28/2023 Type of Study: Bedside Swallow Evaluation Diet Prior to this Study: NPO Respiratory Status: Nasal cannula History of Recent Intubation: No Behavior/Cognition: Alert;Cooperative;Requires  cueing Oral Cavity Assessment: Dry Oral Care Completed by SLP: Yes Oral Cavity - Dentition: Edentulous;Dentures, top;Dentures, bottom Vision: Functional for self-feeding Self-Feeding Abilities: Needs assist Patient Positioning: Upright in bed Baseline Vocal Quality: Hoarse Volitional Cough: Weak Volitional Swallow: Able to elicit    Oral/Motor/Sensory Function Overall Oral Motor/Sensory Function: Mild impairment Facial ROM: Reduced left Facial Symmetry: Abnormal symmetry left Facial Strength: Reduced left Lingual ROM: Reduced left Lingual Strength: Reduced Mandible: Within Functional Limits   Ice Chips Ice chips: Impaired Presentation: Spoon Pharyngeal Phase Impairments: Cough - Delayed   Thin Liquid Thin Liquid: Impaired Presentation: Cup;Straw Pharyngeal  Phase Impairments: Cough - Immediate    Nectar Thick Nectar Thick Liquid: Not tested   Honey Thick Honey Thick Liquid: Not tested   Puree Puree: Within functional limits Presentation: Spoon   Solid     Solid: Impaired Oral Phase Impairments: Poor awareness of bolus Oral Phase Functional Implications: Oral holding      Aurelia Leeks 08/06/2023,2:33  PM

## 2023-08-06 NOTE — Progress Notes (Signed)
 PROGRESS NOTE                                                                                                                                                                                                             Patient Demographics:    [name], is a 78 y.o. male, DOB - 1945-10-13, ZOX:[id number]  Outpatient Primary MD for the patient is Sari Cunning, MD    LOS - 1  Admit date - 08/05/2023    Chief Complaint  Patient presents with   Code Stroke       Brief Narrative (HPI from H&P)    [name] is a 78 y.o. male with medical history significant for hypertension, hyperlipidemia, COPD, chronic hypoxic respiratory failure on 4-6LNC, AAA status post endovascular repair, CAD, HFmrEF, and left hip fracture status post hemiarthroplasty on 07/28/2023 who now presents with left-sided weakness and speech difficulty.   The patient is accompanied by his sons who report that the patient had an episode of lethargy and speech difficulty on 08/03/2023, but he seemed to be back to his usual state yesterday.  Today, family noted that he was having difficulty speaking again, was weak on his left side, and had food that he was given hours earlier pocketed away in his cheek.    ED Course: Upon arrival to the ED, patient is found to be slightly tachypneic with normal HR and elevated BP.  EKG demonstrates sinus rhythm with PACs.  Chest x-ray is negative for acute findings.  Head CT is suspicious for acute infarct in perirolandic posterior right frontal lobe.  Labs are most notable for creatinine 1.32, albumin 2.6, and normal WBC.   Patient was evaluated by neurology in the ED as a code stroke.  MRI brain has been ordered.   Subjective:    [name] was evaluated at the bed side.  Reports he feels better today but still somnolent.  Able to follow some simple commands.  Speech remains dysarthric.   Assessment  & Plan :     Assessment and Plan: # Right MCA stroke - CT findings suspicious for acute infarct involving posterior right frontal lobe MRI brain confirmed patchy acute infarcts in the Right MCA territory, most confluent along the right lateral motor strip. No hemorrhagic transformation or mass effect. - Concern for possible embolic cause of  his stroke - TTE shows EF 30-35%, severe hypokinesis, G1DD, trivial MVR - Neurology following, appreciate recs - A1c 5.2%, LDL 76 - Continue DAPT with aspirin  and Plavix  for 3 weeks then Plavix  alone - Continue atorvastatin  - Remains n.p.o. due to failing SLP eval - Pending PT-OT eval - Continue telemetry, frequent neurochecks - Neurology recommends 30-day monitor at discharge  # Chronic HFmrEF  - Repeat TTE shows EF 30-35%, severe hypokinesis, G1DD, trivial MVR - Patient euvolemic on exam, slightly dry Looks to be on dry side and does not appear that he will pass a swallow screen (will be NPO for now) - Hold diuretics for another day, monitor weight and I/Os     # COPD; chronic hypoxic  respiratory failure  - Not in exacerbation on admission  - Continue supplemental O2 - Continue Spiriva  and as-needed DuoNeb     # CAD  # HLD - No anginal complaints  - Continue ASA, Plavix  and Lipitor    # Hypertension  - Continue permissive HTN to 220/120  # Dysphagia - Evaluated by SLP and thought to be moderate risk of aspiration - N.p.o. except sips with meds recommended - Pending modified barium swallow       Condition -stable  Family Communication  : No family at bedside  Code Status : DNR  Consults  : Neurology  PUD Prophylaxis : PPI   Procedures  :     None      Disposition Plan  :    Status is: Inpatient Remains inpatient appropriate because: Acute stroke  DVT Prophylaxis  :    enoxaparin  (LOVENOX ) injection 40 mg Start: 08/06/23 1000     Lab Results  Component Value Date   PLT 280 08/06/2023    Diet :  Diet Order              Diet NPO time specified  Diet effective now                    Inpatient Medications  Scheduled Meds:   stroke: early stages of recovery book   Does not apply Once   aspirin  EC  81 mg Oral Daily   atorvastatin   80 mg Oral Daily   clopidogrel   75 mg Oral Daily   enoxaparin  (LOVENOX ) injection  40 mg Subcutaneous Daily   pantoprazole   40 mg Oral Daily   umeclidinium bromide   1 puff Inhalation Daily   Continuous Infusions: PRN Meds:.acetaminophen  **OR** acetaminophen  (TYLENOL ) oral liquid 160 mg/5 mL **OR** acetaminophen , ipratropium-albuterol , senna-docusate  Antibiotics  :    Anti-infectives (From admission, onward)    None         Objective:   Vitals:   08/06/23 0245 08/06/23 0338 08/06/23 0630 08/06/23 0743  BP: (!) 147/76 (!) 144/85 124/82 121/62  Pulse: (!) 36 74 68 73  Resp: 20 (!) 25 (!) 24 (!) 22  Temp:  (!) 96.9 F (36.1 C)  98 F (36.7 C)  TempSrc:  Axillary  Axillary  SpO2: 97% 96% 97% 96%  Weight:        Wt Readings from Last 3 Encounters:  08/05/23 81.4 kg  07/28/23 76.5 kg  07/11/23 76.5 kg    No intake or output data in the 24 hours ending 08/06/23 0835   Physical Exam  General: Somnolent elderly man laying in bed. No acute distress. HEENT: [place]/AT. Anicteric sclera. EOMI. Slight right gaze preference CV: RRR. No murmurs, rubs, or gallops. No LE edema Pulmonary: On 4  L [place]. Lungs CTAB. Normal effort. No wheezing or rales. Abdominal: Soft, NT/ND. Normal bowel sounds. Extremities: Palpable radial and DP pulses. Normal ROM. Skin: Warm and dry. No obvious rash or lesions. Neuro: Somnolent. Slightly dysarthric speech. No facial asymmetry. Strength 5/5 in RUE/RLE, 4/5 LUE/LLE. Normal sensation to light touch. Psych: Normal mood and affect    RN pressure injury documentation:      Data Review:    Recent Labs  Lab 07/31/23 0440 08/01/23 0608 08/05/23 2141 08/05/23 2143 08/05/23 2154 08/06/23 0320  WBC 9.4 8.6 9.4  --   --   8.1  HGB 11.8* 11.8* 12.8* 13.3 12.9* 12.2*  HCT 36.1* 36.1* 39.9 39.0 38.0* 38.4*  PLT 159 185 310  --   --  280  MCV 94.5 95.0 96.4  --   --  97.7  MCH 30.9 31.1 30.9  --   --  31.0  MCHC 32.7 32.7 32.1  --   --  31.8  RDW 13.0 13.4 13.4  --   --  13.4  LYMPHSABS 0.9 0.9 1.2  --   --   --   MONOABS 0.6 0.7 0.8  --   --   --   EOSABS 0.2 0.3 0.4  --   --   --   BASOSABS 0.0 0.0 0.1  --   --   --     Recent Labs  Lab 07/31/23 0440 08/01/23 0608 08/05/23 2141 08/05/23 2143 08/05/23 2154 08/06/23 0320  NA 141 141 140 141 142  --   K 4.0 3.8 3.8 3.9 3.8  --   CL 105 102 105 105  --   --   CO2 28 26 26   --   --   --   ANIONGAP 8 13 9   --   --   --   GLUCOSE 91 83 139* 133*  --   --   BUN 23 36* 25* 28*  --   --   CREATININE 1.15 1.44* 1.33* 1.50*  --   --   AST  --   --  45*  --   --   --   ALT  --   --  28  --   --   --   ALKPHOS  --   --  89  --   --   --   BILITOT  --   --  1.2  --   --   --   ALBUMIN  --   --  2.6*  --   --   --   INR  --   --  1.1  --   --   --   HGBA1C  --   --   --   --   --  5.2  CALCIUM  8.4* 8.5* 8.7*  --   --   --       Recent Labs  Lab 07/31/23 0440 08/01/23 0608 08/05/23 2141 08/06/23 0320  INR  --   --  1.1  --   HGBA1C  --   --   --  5.2  CALCIUM  8.4* 8.5* 8.7*  --     --------------------------------------------------------------------------------------------------------------- Lab Results  Component Value Date   CHOL 133 08/06/2023   HDL 29 (L) 08/06/2023   LDLCALC 76 08/06/2023   TRIG 140 08/06/2023   CHOLHDL 4.6 08/06/2023    Lab Results  Component Value Date   HGBA1C 5.2 08/06/2023   No results for input(s): "TSH", "T4TOTAL", "FREET4", "T3FREE", "THYROIDAB"  in the last 72 hours. No results for input(s): "VITAMINB12", "FOLATE", "FERRITIN", "TIBC", "IRON", "RETICCTPCT" in the last 72 hours. ------------------------------------------------------------------------------------------------------------------ Cardiac  Enzymes No results for input(s): "CKMB", "TROPONINI", "MYOGLOBIN" in the last 168 hours.  Invalid input(s): "CK"  Micro Results Recent Results (from the past 240 hours)  Surgical pcr screen     Status: None   Collection Time: 07/28/23  8:12 AM   Specimen: Nasal Mucosa; Nasal Swab  Result Value Ref Range Status   MRSA, PCR NEGATIVE NEGATIVE Final   Staphylococcus aureus NEGATIVE NEGATIVE Final    Comment: (NOTE) The Xpert SA Assay (FDA approved for NASAL specimens in patients 10 years of age and older), is one component of a comprehensive surveillance program. It is not intended to diagnose infection nor to guide or monitor treatment. Performed at [facility], [address] [place], [place] [address]     Radiology Reports MR BRAIN WO CONTRAST Result Date: 08/06/2023 CLINICAL DATA:  78 year old male neurologic deficit. Code stroke presentation yesterday. Right perirolandic ischemia suspected on CT and CT P. EXAM: MRI HEAD WITHOUT CONTRAST TECHNIQUE: Multiplanar, multiecho pulse sequences of the brain and surrounding structures were obtained without intravenous contrast. COMPARISON:  Head CT, CTA, CTP yesterday. FINDINGS: Brain: Patchy, confluent 3 cm area of restricted diffusion in the right lateral motor strip, posterior frontal operculum tracking toward the right upper extremity representation area. Faint additional pre motor diffusion restriction, and somewhat separate 1.5 cm area of confluent restricted diffusion in the anterior right corona radiata near the caudate. These correspond to the earlier CT findings. There is heterogeneous T2 and FLAIR hyperintense cytotoxic edema. No hemorrhagic transformation. No mass effect. Superimposed chronic encephalomalacia throughout the lateral right temporal lobe, posterior and lateral right occipital lobe, inferior right parietal lobe. Small area of chronic right inferior frontal gyrus encephalomalacia. Moderate T2 and FLAIR heterogeneity  throughout the bilateral deep gray nuclei appears to be post ischemic also and there is a chronic microhemorrhage in the left thalamus. Chronic small left cerebellar infarct with encephalomalacia. Tiny chronic right cerebellar infarct. No midline shift, mass effect, evidence of mass lesion, ventriculomegaly, extra-axial collection or acute intracranial hemorrhage. Cervicomedullary junction and pituitary are within normal limits. Vascular: Major intracranial vascular flow voids are preserved. Skull and upper cervical spine: Negative. Visualized bone marrow signal is within normal limits. Sinuses/Orbits: Rightward gaze.  Paranasal sinuses well aerated. Other: Mild bilateral mastoid effusions. Trace retained secretions in the nasopharynx. IMPRESSION: 1. Confirmed patchy acute infarcts in the Right MCA territory, most confluent along the right lateral motor strip. No hemorrhagic transformation or mass effect. 2. Underlying advanced chronic small and medium-sized vessel ischemic disease, possibly with some superimposed chronic posttraumatic encephalomalacia. Electronically Signed   By: Marlise Simpers M.D.   On: 08/06/2023 05:44   DG Chest Portable 1 View Result Date: 08/05/2023 CLINICAL DATA:  Cough code stroke EXAM: PORTABLE CHEST 1 VIEW COMPARISON:  07/28/2023 FINDINGS: Mildly diminished lung volumes. No consolidation, pleural effusion or pneumothorax. Stable cardiomediastinal silhouette with aortic atherosclerosis. No pneumothorax IMPRESSION: No active disease. Mildly diminished lung volumes. Electronically Signed   By: Esmeralda Hedge M.D.   On: 08/05/2023 23:12   CT HEAD CODE STROKE WO CONTRAST Addendum Date: 08/05/2023 ADDENDUM REPORT: 08/05/2023 22:36 ADDENDUM: Findings discussed with Dr. Lindzen via telephone at 10:33 p.m. Electronically Signed   By: Stevenson Elbe M.D.   On: 08/05/2023 22:36   Result Date: 08/05/2023 CLINICAL DATA:  Code stroke.  Neuro deficit, acute, stroke suspected EXAM: CT HEAD WITHOUT  CONTRAST TECHNIQUE: Contiguous axial images were obtained from the base of the skull through the vertex without intravenous contrast. RADIATION DOSE REDUCTION: This exam was performed according to the departmental dose-optimization program which includes automated exposure control, adjustment of the mA and/or kV according to patient size and/or use of iterative reconstruction technique. COMPARISON:  CT head April 12, 25. FINDINGS: Brain: Similar encephalomalacia in the right frontal, parietal, occipital and temporal lobes. Similar patchy white matter hypodensities. Similar remote left cerebellar infarct. New area of low attenuation and loss of gray white differentiation in the perirolandic posterior right frontal lobe, suspicious for acute infarct. No evidence of acute hemorrhage, midline shift, mass lesion or hydrocephalus. Vascular: No hyperdense vessel. Skull: No acute fracture. Sinuses/Orbits: Clear sinuses.  No acute orbital findings. ASPECTS ([place] Stroke Program Early CT Score) - Ganglionic level infarction (caudate, lentiform nuclei, internal capsule, insula, M1-M3 cortex): 7 - Supraganglionic infarction (M4-M6 cortex): 1-2 Total score (0-10 with 10 being normal): 8-9 Dr. Lindzen has been paged at the time of dictation for call of report. IMPRESSION: 1. Findings suspicious for acute infarct in the perirolandic posterior right frontal lobe, suspicious for acute infarct. ASPECTS is 8-9. 2. No acute hemorrhage. 3. Similar extensive remote infarcts. Electronically Signed: By: Stevenson Elbe M.D. On: 08/05/2023 22:05   CT ANGIO HEAD NECK W WO CM W PERF (CODE STROKE) Result Date: 08/05/2023 CLINICAL DATA:  Neuro deficit, acute, stroke suspected EXAM: CT ANGIOGRAPHY HEAD AND NECK CT PERFUSION BRAIN TECHNIQUE: Multidetector CT imaging of the head and neck was performed using the standard protocol during bolus administration of intravenous contrast. Multiplanar CT image reconstructions and MIPs were obtained  to evaluate the vascular anatomy. Carotid stenosis measurements (when applicable) are obtained utilizing NASCET criteria, using the distal internal carotid diameter as the denominator. Multiphase CT imaging of the brain was performed following IV bolus contrast injection. Subsequent parametric perfusion maps were calculated using RAPID software. RADIATION DOSE REDUCTION: This exam was performed according to the departmental dose-optimization program which includes automated exposure control, adjustment of the mA and/or kV according to patient size and/or use of iterative reconstruction technique. CONTRAST:  OMNIPAQUE  IOHEXOL  350 MG/ML SOLN COMPARISON:  Same day CT head. FINDINGS: CTA NECK FINDINGS Aortic arch: Aortic atherosclerosis. Great vessel origins are incompletely imaged. Visualized great vessel origins are patent. Moderate left subclavian artery stenosis. Right carotid system: Atherosclerosis at the carotid bifurcation involving the proximal ICA with approximately 30% stenosis relative to the distal vessel. Left carotid system: Common carotid artery origin is not imaged. Atherosclerosis at the carotid bifurcation without greater than 50% stenosis. Vertebral arteries: Severe left vertebral artery origin stenosis. Mildly diminished opacification of the more distal vertebral artery. Right vertebral arteries patent without greater than 50% stenosis. Skeleton: No acute abnormality on limited assessment. Other neck: No acute abnormality on limited assessment. Upper chest: Visualized lung apices are clear. Review of the MIP images confirms the above findings CTA HEAD FINDINGS Anterior circulation: Hypoplastic right A1 ACA, likely congenital. Otherwise, bilateral the intracranial ICAs, MCAs, and ACAs are patent without proximal high-grade stenosis. Posterior circulation: Diminished opacification of the left intradural vertebral artery. Moderate left and mild right intradural vertebral artery stenosis due to  atherosclerosis. The basilar artery and bilateral posterior cerebral arteries are patent. Severe left P2 PCA stenosis. Venous sinuses: Not well assessed arterial timing. Review of the MIP images confirms the above findings CT Brain Perfusion Findings: CBF (<30%) Volume: 0mL Perfusion (Tmax>6.0s) volume: 4mL Mismatch Volume: 4mL Infarction Location:No core infarct identified. IMPRESSION:  CTA: 1. No emergent large vessel occlusion. 2. Severe left P2 PCA stenosis. 3. Severe left vertebral artery origin stenosis with diminished opacification of the more distal vertebral artery. 4. Moderate left intradural vertebral artery stenosis. 5. Moderate left subclavian artery stenosis. 6. Aortic Atherosclerosis (ICD10-I70.0). CT perfusion: 1. RAPID reports approximately 4 mL of mismatch/penumbra in the posterior right frontal area, which correlates with findings on CT head. 2. No evidence of core infarct by CT perfusion. MRI could further evaluate. Findings discussed with Dr. Lindzen via telephone at 10:33 p.m. Electronically Signed   By: Stevenson Elbe M.D.   On: 08/05/2023 22:36      Signature  -   Vita Grip M.D on 08/06/2023 at 8:35 AM   -  To page go to [email or web address]

## 2023-08-06 NOTE — Progress Notes (Signed)
 SLP Cancellation Note  Patient Details Name: ZAYLIN RUNCO MRN: [id number] DOB: November 12, 1945   Cancelled treatment:       Reason Eval/Treat Not Completed: Patient at procedure or test/unavailable; pt at MRI and with echo when BSE/SLE attempted;  ST will continue efforts.   Pat Audra Kagel,M.S.,CCC-SLP 08/06/2023, 12:53 PM

## 2023-08-06 NOTE — ED Notes (Signed)
 Patient tolerating meds with applesauce, encourage swallowing.

## 2023-08-06 NOTE — Progress Notes (Signed)
 PT Cancellation Note  Patient Details Name: [name] MRN: [id number] DOB: [birth year]   Cancelled Treatment:    Reason Eval/Treat Not Completed: Patient at procedure or test/unavailable (Pt went for MBS.  Will check back tomorrow.)   Florencia Hunter 08/06/2023, 3:57 PM Milynn Quirion M,PT Acute Rehab Services [phone]

## 2023-08-06 NOTE — ED Notes (Signed)
 Pt transported to MRI

## 2023-08-07 ENCOUNTER — Inpatient Hospital Stay (HOSPITAL_COMMUNITY)

## 2023-08-07 DIAGNOSIS — I639 Cerebral infarction, unspecified: Secondary | ICD-10-CM | POA: Diagnosis not present

## 2023-08-07 DIAGNOSIS — R29709 NIHSS score 9: Secondary | ICD-10-CM | POA: Diagnosis not present

## 2023-08-07 LAB — CBC
HCT: 38 % — ABNORMAL LOW (ref 39.0–52.0)
Hemoglobin: 12.2 g/dL — ABNORMAL LOW (ref 13.0–17.0)
MCH: 30.9 pg (ref 26.0–34.0)
MCHC: 32.1 g/dL (ref 30.0–36.0)
MCV: 96.2 fL (ref 80.0–100.0)
Platelets: 262 10*3/uL (ref 150–400)
RBC: 3.95 MIL/uL — ABNORMAL LOW (ref 4.22–5.81)
RDW: 13.5 % (ref 11.5–15.5)
WBC: 8.4 10*3/uL (ref 4.0–10.5)
nRBC: 0 % (ref 0.0–0.2)

## 2023-08-07 MED ORDER — EZETIMIBE 10 MG PO TABS
10.0000 mg | ORAL_TABLET | Freq: Every day | ORAL | Status: DC
  Administered 2023-08-07 – 2023-08-09 (×3): 10 mg via ORAL
  Filled 2023-08-07 (×3): qty 1

## 2023-08-07 MED ORDER — LACTATED RINGERS IV SOLN: INTRAVENOUS | Status: AC

## 2023-08-07 NOTE — Evaluation (Signed)
 Occupational Therapy Evaluation Patient Details Name: [name] MRN: [id number] DOB: Nov 11, 1945 Today's Date: 08/07/2023   History of Present Illness   Pt is a 78 yo male that presents from SNF rehab with L sided weakness and speech difficulties. MRI brain showed acute infarcts in R MCA territory. Recent admissions 4/12-4/16 after fall, L hip fx and surgical repair. Admission in March d/t sepsis and PNA. PMH of COPD stage II, anxiety, stroke, neuromuscular disorder,depression, HTN, prostate cancer s/p seed implantation, HLD, lumbar disc disease, CAD, AAA , intraparenchymal hemorrhage in 02/2021, , nephrolithiasis, GERD, tobacco use and chronic ischemic heart failure.     Clinical Impressions PTA, pt admitted from SNF rehab with diagnoses above and deficits in L sided strength, proprioception, balance, cardiopulmonary endurance and coordination. Pt overall requires Mod A for bed mobility and up to Mod A x 2 for transfer to recliner d/t posterior bias and increased tone in LLE with movement. Pt requiring Mod A for UB ADL and up to Total A for LB ADL. Patient will benefit from continued inpatient follow up therapy, <3 hours/day at DC.     If plan is discharge home, recommend the following:   A lot of help with walking and/or transfers;Two people to help with walking and/or transfers;A lot of help with bathing/dressing/bathroom;Two people to help with bathing/dressing/bathroom     Functional Status Assessment   Patient has had a recent decline in their functional status and demonstrates the ability to make significant improvements in function in a reasonable and predictable amount of time.     Equipment Recommendations   Other (comment) (TBD)     Recommendations for Other Services         Precautions/Restrictions   Precautions Precautions: Fall Restrictions Weight Bearing Restrictions Per Provider Order: Yes LLE Weight Bearing Per Provider Order: Weight bearing as  tolerated     Mobility Bed Mobility Overal bed mobility: Needs Assistance Bed Mobility: Supine to Sit     Supine to sit: Mod assist, HOB elevated     General bed mobility comments: able to assist with LE to EOB with handheld assist to lift trunk.    Transfers Overall transfer level: Needs assistance Equipment used: Rolling walker (2 wheels) Transfers: Sit to/from Stand, Bed to chair/wheelchair/BSC Sit to Stand: Mod assist, +2 physical assistance, +2 safety/equipment     Step pivot transfers: Mod assist, +2 physical assistance, +2 safety/equipment     General transfer comment: able to stand and step to recliner using RW (though LUE unable to hold well) with hands on assist to maintain balance d/t posterior  bias and tactile assist to advance LLE due to abnormal tone/posturing with movement.      Balance Overall balance assessment: Needs assistance Sitting-balance support: Feet supported, No upper extremity supported, Single extremity supported Sitting balance-Leahy Scale: Fair   Postural control: Posterior lean Standing balance support: Bilateral upper extremity supported, During functional activity Standing balance-Leahy Scale: Poor                             ADL either performed or assessed with clinical judgement   ADL Overall ADL's : Needs assistance/impaired Eating/Feeding: NPO   Grooming: Sitting;Moderate assistance   Upper Body Bathing: Moderate assistance;Sitting   Lower Body Bathing: Maximal assistance;Sit to/from stand;Sitting/lateral leans   Upper Body Dressing : Moderate assistance;Sitting   Lower Body Dressing: Maximal assistance;Sitting/lateral leans;Sit to/from stand   Toilet Transfer: Moderate assistance;+2 for physical assistance;+2 for safety/equipment;Stand-pivot  Toileting- Clothing Manipulation and Hygiene: Total assistance;Sitting/lateral lean;Sit to/from stand               Vision Baseline Vision/History: 1 Wears  glasses Ability to See in Adequate Light: 1 Impaired Patient Visual Report: No change from baseline Vision Assessment?: No apparent visual deficits Additional Comments: able to scan/gaze to L and R side without issues, does not have glasses at bedside     Perception         Praxis         Pertinent Vitals/Pain Pain Assessment Pain Assessment: Faces Faces Pain Scale: Hurts a little bit Pain Location: L hip Pain Descriptors / Indicators: Grimacing, Guarding, Sore Pain Intervention(s): Monitored during session, Limited activity within patient's tolerance     Extremity/Trunk Assessment Upper Extremity Assessment Upper Extremity Assessment: Right hand dominant;LUE deficits/detail LUE Deficits / Details: difficulty maintaining grasp on RW with this UE with pt seemingly unaware. able to assist in reaching up to RW. unable to fully assess LUE functions as pt dozing off by end of session. LUE Sensation: decreased proprioception LUE Coordination: decreased fine motor;decreased gross motor   Lower Extremity Assessment Lower Extremity Assessment: Defer to PT evaluation   Cervical / Trunk Assessment Cervical / Trunk Assessment: Normal   Communication Communication Communication: Impaired Factors Affecting Communication: Reduced clarity of speech   Cognition Arousal: Alert, Lethargic Behavior During Therapy: Flat affect, WFL for tasks assessed/performed Cognition: No family/caregiver present to determine baseline             OT - Cognition Comments: pt pleasant, able to recall PLOF and name of rehab place. initially alert but with fatigue from tasks, pt dozing off with difficulty further engaging in LUE assessments                 Following commands: Impaired Following commands impaired: Follows one step commands with increased time, Only follows one step commands consistently     Cueing  General Comments   Cueing Techniques: Verbal cues;Tactile cues  SpO2 91-92%  on 5 L O2   Exercises     Shoulder Instructions      Home Living Family/patient expects to be discharged to:: Skilled nursing facility Living Arrangements: Alone Available Help at Discharge: Family;Available PRN/intermittently Type of Home: Mobile home Home Access: Stairs to enter;Ramped entrance Entrance Stairs-Number of Steps: 4 Entrance Stairs-Rails: Can reach both Home Layout: One level     Bathroom Shower/Tub: Producer, television/film/video: Standard     Home Equipment: Agricultural consultant (2 wheels);Cane - single point   Additional Comments: step sons bring food and drive to appointments if needed, checks on him nightly; pt at [facility] for [facility] after recent hospitalization s/p L hip fx/sx      Prior Functioning/Environment Prior Level of Function : Independent/Modified Independent             Mobility Comments: previously Mod I using RW. reports working on standing/transfers at [facility] rehab ADLs Comments: Previously Mod I for ADLs w/ assist for IADLs. Pt reports assistance with ADLs at SNF rehab, not transferring onto toilet yet per pt    OT Problem List: Decreased strength;Decreased activity tolerance;Impaired balance (sitting and/or standing);Decreased coordination;Decreased cognition;Decreased knowledge of use of DME or AE;Decreased knowledge of precautions;Cardiopulmonary status limiting activity;Impaired UE functional use;Impaired tone;Impaired sensation   OT Treatment/Interventions: Self-care/ADL training;Therapeutic activities;Therapeutic exercise;Patient/family education;Balance training;Energy conservation;DME and/or AE instruction      OT Goals(Current goals can be found in the care plan section)  Acute Rehab OT Goals Patient Stated Goal: do whatever i got to do to get better OT Goal Formulation: With patient Time For Goal Achievement: 08/21/23 Potential to Achieve Goals: Good   OT Frequency:  Min 2X/week    Co-evaluation PT/OT/SLP  Co-Evaluation/Treatment: Yes Reason for Co-Treatment: For patient/therapist safety;To address functional/ADL transfers   OT goals addressed during session: ADL's and self-care;Strengthening/ROM;Proper use of Adaptive equipment and DME      AM-PAC OT "6 Clicks" Daily Activity     Outcome Measure Help from another person eating meals?: Total (NPO) Help from another person taking care of personal grooming?: A Lot Help from another person toileting, which includes using toliet, bedpan, or urinal?: Total Help from another person bathing (including washing, rinsing, drying)?: A Lot Help from another person to put on and taking off regular upper body clothing?: A Lot Help from another person to put on and taking off regular lower body clothing?: A Lot 6 Click Score: 10   End of Session Equipment Utilized During Treatment: Rolling walker (2 wheels);Gait belt Nurse Communication: Mobility status  Activity Tolerance: Patient tolerated treatment well Patient left: in chair;with call bell/phone within reach;with chair alarm set  OT Visit Diagnosis: Unsteadiness on feet (R26.81);Other abnormalities of gait and mobility (R26.89);Muscle weakness (generalized) (M62.81);History of falling (Z91.81);Hemiplegia and hemiparesis Hemiplegia - Right/Left: Left Hemiplegia - dominant/non-dominant: Non-Dominant Hemiplegia - caused by: Cerebral infarction                Time: 4098-1191 OT Time Calculation (min): 23 min Charges:  OT General Charges $OT Visit: 1 Visit OT Evaluation $OT Eval Moderate Complexity: 1 Mod  Lawrence Pretty, OTR/L Acute Rehab Services Office: [phone]   Annabella Barr 08/07/2023, 10:26 AM

## 2023-08-07 NOTE — TOC Initial Note (Signed)
 Transition of Care [facility]) - Initial/Assessment Note    Patient Details  Name: [name] MRN: [id number] Date of Birth: [birth year]  [facility]) CM/SW Contact:    Jannice Mends, LCSW Phone Number: 08/07/2023, 2:30 PM  Clinical Narrative:                 CSW spoke with patient's son, Adolm Ahumada, and updated [facility]. CSW initiated insurance process for SNF and PTAR.  Expected Discharge Plan: Skilled Nursing Facility Barriers to Discharge: Continued Medical Work up, English as a second language teacher   Patient Goals and CMS Choice Patient states their goals for this hospitalization and ongoing recovery are:: Rehab CMS Medicare.gov Compare Post Acute Care list provided to:: Patient Represenative (must comment) Choice offered to / list presented to : Adult Children [employer] ownership interest in [facility].provided to:: Adult Children    Expected Discharge Plan and Services In-house Referral: Clinical Social Work   Post Acute Care Choice: Skilled Nursing Facility Living arrangements for the past 2 months: Single Family Home                                      Prior Living Arrangements/Services Living arrangements for the past 2 months: Single Family Home Lives with:: Self Patient language and need for interpreter reviewed:: Yes Do you feel safe going back to the place where you live?: Yes      Need for Family Participation in Patient Care: Yes (Comment) Care giver support system in place?: Yes (comment) Current home services: Homehealth aide Criminal Activity/Legal Involvement Pertinent to Current Situation/Hospitalization: No - Comment as needed  Activities of Daily Living      Permission Sought/Granted Permission sought to share information with : Facility Medical sales representative, Family Supports Permission granted to share information with : Yes, Verbal Permission Granted  Share Information with NAME: Shawn  Permission granted to share info w  AGENCY: [facility]  Permission granted to share info w Relationship: Son  Permission granted to share info w Contact Information: [phone]  Emotional Assessment Appearance:: Appears stated age Attitude/Demeanor/Rapport: Unable to Assess Affect (typically observed): Unable to Assess Orientation: : Oriented to Self, Oriented to Place Alcohol / Substance Use: Not Applicable Psych Involvement: No (comment)  Admission diagnosis:  Acute ischemic stroke (HCC) [I63.9] Stroke-like symptoms [R29.90] Cerebrovascular accident (CVA), unspecified mechanism (HCC) [I63.9] Patient Active Problem List   Diagnosis Date Noted   Dysphagia 08/06/2023   Acute ischemic stroke (HCC) 08/06/2023   Cerebrovascular accident (CVA) (HCC) 08/05/2023   Fracture of femoral neck, left (HCC) 07/28/2023   History of CAD (coronary artery disease) 07/28/2023   History of AAA (abdominal aortic aneurysm) repair 07/28/2023   Acute on chronic hypoxic respiratory failure (HCC) 12/13/2022   COPD (chronic obstructive pulmonary disease) (HCC) 12/12/2022   Acute hypoxemic respiratory failure (HCC) 12/12/2022   Acute on chronic combined systolic and diastolic CHF (congestive heart failure) (HCC) 04/24/2021   Anemia 04/24/2021   Acute on chronic respiratory failure with hypoxia (HCC) 04/24/2021   Incarcerated inguinal hernia 03/18/2021   Chronic systolic CHF (congestive heart failure) (HCC)    Fall at home, initial encounter 02/21/2021   History of stroke 02/21/2021   Elevated troponin 02/21/2021   Other spondylosis with radiculopathy, lumbar region 09/04/2018   Centrilobular emphysema (HCC) 07/19/2018   Dilated cardiomyopathy (HCC) 07/19/2018   Coronary artery disease of native artery of native heart with stable angina pectoris (HCC)  01/20/2018   AAA (abdominal aortic aneurysm) without rupture (HCC) 10/30/2017   COPD exacerbation (HCC) 04/03/2015   Barrett's esophagus 05/20/2014   Constipation 05/20/2014   Arthritis of  hand 05/07/2014   Pulmonary nodule 11/06/2011   Chronic hypoxic respiratory failure (HCC) 04/24/2011   Tobacco abuse 02/12/2011   CAD, NATIVE VESSEL/HLD 10/18/2008   COLONIC POLYPS 08/05/2008   Hyperlipidemia 08/05/2008   Essential hypertension 08/05/2008   GERD 08/05/2008   PCP:  Sari Cunning, MD Pharmacy:   [facility] - [place], [place] - [address] [place] [address] Phone: [phone] Fax: [phone]  [facility] [place], [place] - [address] [place] [address] Phone: [phone] Fax: [phone]  [facility] [address] [place] [address] Phone: [phone] Fax: [phone]     Social Drivers of Health (SDOH) Social History: SDOH Screenings   Food Insecurity: No Food Insecurity (07/28/2023)  Housing: Low Risk  (07/28/2023)  Transportation Needs: No Transportation Needs (07/28/2023)  Utilities: Not At Risk (07/28/2023)  Depression (PHQ2-9): Low Risk  (09/03/2018)  Financial Resource Strain: Low Risk  (01/03/2023)   Received from [facility]  Social Connections: Moderately Isolated (07/28/2023)  Stress: No Stress Concern Present (08/21/2018)  Tobacco Use: High Risk (08/05/2023)   SDOH Interventions:     Readmission Risk Interventions    04/28/2021    9:32 AM  Readmission Risk Prevention Plan  Transportation Screening Complete  PCP or Specialist Appt within 3-5 Days Complete  HRI or Home Care Consult Complete  Social Work Consult for Recovery Care Planning/Counseling Complete  Palliative Care Screening Not Applicable  Medication Review Oceanographer) Complete

## 2023-08-07 NOTE — Progress Notes (Signed)
 Cardiac monitoring called patient O2 stats 77% his Oxygen  was off and he was side ways in the bed encouraged deep breathing lung sounds diminished increased O2 to 6 liters  no improvement gave PRN breathing treatment and sat patient upright in bed. After several instructions of deep breathing placed non re breather O2 stat better at 100% Charge RN called and asked to call RT to assess. Placed back on nasal cannula 6 liters with humidity.

## 2023-08-07 NOTE — Progress Notes (Signed)
 PROGRESS NOTE                                                                                                                                                                                                             Patient Demographics:    [name], is a 78 y.o. male, DOB - 09-04-1945, WUJ:[id number]  Outpatient Primary MD for the patient is Sari Cunning, MD    LOS - 2  Admit date - 08/05/2023    Chief Complaint  Patient presents with   Code Stroke       Brief Narrative (HPI from H&P)   78 y.o. male with medical history significant for hypertension, hyperlipidemia, COPD, chronic hypoxic respiratory failure on 4-6LNC, AAA status post endovascular repair, CAD, HFmrEF, and left hip fracture status post hemiarthroplasty on 07/28/2023 who now presents with left-sided weakness and speech difficulty.  Further workup suggestive of right MCA stroke and he was admitted to the hospital.   Subjective:    [name] today has, No headache, No chest pain, No abdominal pain - No Nausea, mild left sided weakness R more than leg, no SOB   Assessment  & Plan :    Acute right MCA stroke -  CT findings suspicious for acute infarct involving posterior right frontal lobe MRI brain confirmed patchy acute infarcts in the Right MCA territory, most confluent along the right lateral motor strip. No hemorrhagic transformation or mass effect.  Echo shows EF of 30 to 35% with global hypokinesis, seen by stroke team, full stroke workup done.  Currently on aspirin  and Plavix  for 3 weeks then Plavix  alone, LDL was 76 already on highest dose statin hence Zetia  added, A1c 5.2.  Continue PT-OT-speech eval, will require SNF.   Neurology recommends 30-day monitor at discharge.  Chronic HFmrEF   EF was 35% with global kinesis, blood pressure too low for beta-blocker, diuretics, ACE/ARB or Entresto .  Currently appears dehydrated will be hydrated with IV  fluids gently on 08/07/2023.   Hypokalemia, hypophosphatemia  - As needed repletion   Essential hypertension:   Permissive hypertension due to stroke, currently blood pressure too low for any medication.   CAD/hyperlipidemia  Denies chest pain.  On aspirin  and statin   Ongoing smoking.  Counseled to quit.     COPD; chronic hypoxic  respiratory failure - Not in  exacerbation on admission, continue supportive care.         Condition - Extremely Guarded  Family Communication  :    Deland Favre [phone]  on 08/07/23 at 7:56 AM message left  Son Adolm Ahumada [phone]  on 08/07/23 at 7:58 AM message left   Code Status :  DNR  Consults  :  Neuro  PUD Prophylaxis : PPI   Procedures  :           Disposition Plan  :    Status is: Inpatient   DVT Prophylaxis  :    enoxaparin  (LOVENOX ) injection 40 mg Start: 08/06/23 1000    Lab Results  Component Value Date   PLT 262 08/07/2023    Diet :  Diet Order             Diet NPO time specified Except for: Ice Chips  Diet effective now                    Inpatient Medications  Scheduled Meds:  aspirin  EC  81 mg Oral Daily   atorvastatin   80 mg Oral Daily   clopidogrel   75 mg Oral Daily   enoxaparin  (LOVENOX ) injection  40 mg Subcutaneous Daily   ezetimibe   10 mg Oral Daily   pantoprazole   40 mg Oral Daily   umeclidinium bromide   1 puff Inhalation Daily   Continuous Infusions:  lactated ringers      PRN Meds:.acetaminophen  **OR** acetaminophen  (TYLENOL ) oral liquid 160 mg/5 mL **OR** acetaminophen , HYDROcodone -acetaminophen , ipratropium-albuterol , senna-docusate  Antibiotics  :    Anti-infectives (From admission, onward)    None         Objective:   Vitals:   08/06/23 2343 08/07/23 0353 08/07/23 0500 08/07/23 0743  BP: 123/68 101/88    Pulse:      Resp:      Temp: 97.7 F (36.5 C) (!) 97.5 F (36.4 C)    TempSrc: Oral Oral    SpO2:    96%  Weight:   67.8 kg     Wt Readings from Last 3  Encounters:  08/07/23 67.8 kg  07/28/23 76.5 kg  07/11/23 76.5 kg     Intake/Output Summary (Last 24 hours) at 08/07/2023 0754 Last data filed at 08/07/2023 0700 Gross per 24 hour  Intake 0 ml  Output 400 ml  Net -400 ml     Physical Exam  Awake Alert, No new F.N deficits, does have comparative left-sided weakness, strength 4/5, arm weaker than leg [place].AT,PERRAL Supple Neck, No JVD,   Symmetrical Chest wall movement, Good air movement bilaterally, CTAB RRR,No Gallops,Rubs or new Murmurs,  +ve B.Sounds, Abd Soft, No tenderness,   No Cyanosis, Clubbing or edema        Data Review:    Recent Labs  Lab 08/01/23 0608 08/05/23 2141 08/05/23 2143 08/05/23 2154 08/06/23 0320 08/07/23 0427  WBC 8.6 9.4  --   --  8.1 8.4  HGB 11.8* 12.8* 13.3 12.9* 12.2* 12.2*  HCT 36.1* 39.9 39.0 38.0* 38.4* 38.0*  PLT 185 310  --   --  280 262  MCV 95.0 96.4  --   --  97.7 96.2  MCH 31.1 30.9  --   --  31.0 30.9  MCHC 32.7 32.1  --   --  31.8 32.1  RDW 13.4 13.4  --   --  13.4 13.5  LYMPHSABS 0.9 1.2  --   --   --   --   MONOABS  0.7 0.8  --   --   --   --   EOSABS 0.3 0.4  --   --   --   --   BASOSABS 0.0 0.1  --   --   --   --     Recent Labs  Lab 08/01/23 0608 08/05/23 2141 08/05/23 2143 08/05/23 2154 08/06/23 0320  NA 141 140 141 142  --   K 3.8 3.8 3.9 3.8  --   CL 102 105 105  --   --   CO2 26 26  --   --   --   ANIONGAP 13 9  --   --   --   GLUCOSE 83 139* 133*  --   --   BUN 36* 25* 28*  --   --   CREATININE 1.44* 1.33* 1.50*  --   --   AST  --  45*  --   --   --   ALT  --  28  --   --   --   ALKPHOS  --  89  --   --   --   BILITOT  --  1.2  --   --   --   ALBUMIN  --  2.6*  --   --   --   INR  --  1.1  --   --   --   HGBA1C  --   --   --   --  5.2  CALCIUM  8.5* 8.7*  --   --   --       Recent Labs  Lab 08/01/23 0608 08/05/23 2141 08/06/23 0320  INR  --  1.1  --   HGBA1C  --   --  5.2  CALCIUM  8.5* 8.7*  --      --------------------------------------------------------------------------------------------------------------- Lab Results  Component Value Date   CHOL 133 08/06/2023   HDL 29 (L) 08/06/2023   LDLCALC 76 08/06/2023   TRIG 140 08/06/2023   CHOLHDL 4.6 08/06/2023    Lab Results  Component Value Date   HGBA1C 5.2 08/06/2023   No results for input(s): "TSH", "T4TOTAL", "FREET4", "T3FREE", "THYROIDAB" in the last 72 hours. No results for input(s): "VITAMINB12", "FOLATE", "FERRITIN", "TIBC", "IRON", "RETICCTPCT" in the last 72 hours. ------------------------------------------------------------------------------------------------------------------ Cardiac Enzymes No results for input(s): "CKMB", "TROPONINI", "MYOGLOBIN" in the last 168 hours.  Invalid input(s): "CK"  Micro Results Recent Results (from the past 240 hours)  Surgical pcr screen     Status: None   Collection Time: 07/28/23  8:12 AM   Specimen: Nasal Mucosa; Nasal Swab  Result Value Ref Range Status   MRSA, PCR NEGATIVE NEGATIVE Final   Staphylococcus aureus NEGATIVE NEGATIVE Final    Comment: (NOTE) The Xpert SA Assay (FDA approved for NASAL specimens in patients 34 years of age and older), is one component of a comprehensive surveillance program. It is not intended to diagnose infection nor to guide or monitor treatment. Performed at [facility], [address], [place], [place] [address]     Radiology Report ECHOCARDIOGRAM COMPLETE Result Date: 08/06/2023    ECHOCARDIOGRAM REPORT   Patient Name:   CALEEL KINER Date of Exam: 08/06/2023 Medical Rec #:  [id number]      Height:       72.0 in Accession #:    [id number]     Weight:       179.5 lb Date of Birth:  03-Jan-1946      BSA:  2.035 m Patient Age:    77 years       BP:           118/76 mmHg Patient Gender: M              HR:           59 bpm. Exam Location:  Inpatient Procedure: 2D Echo, Color Doppler, Cardiac Doppler and Intracardiac             Opacification Agent (Both Spectral and Color Flow Doppler were            utilized during procedure). Indications:    Stroke  History:        Patient has prior history of Echocardiogram examinations.                 Previous Myocardial Infarction, Stroke; Risk Factors:Current                 Smoker, Hypertension and HLD.  Sonographer:    Willey Harrier Referring Phys: [id number] TIMOTHY S OPYD  Sonographer Comments: Suboptimal parasternal window and Technically difficult study due to poor echo windows. IMPRESSIONS  1. Left ventricular ejection fraction, by estimation, is 30 to 35%. The left ventricle has moderately decreased function. The left ventricle demonstrates regional wall motion abnormalities with severe hypokinesis of the anterosepl, anterior, and inferior walls as well as the apex. No LV thrombus noted. Left ventricular diastolic parameters are consistent with Grade I diastolic dysfunction (impaired relaxation).  2. Right ventricular systolic function is mildly reduced. The right ventricular size is normal. There is normal pulmonary artery systolic pressure. The estimated right ventricular systolic pressure is 29.0 mmHg.  3. The mitral valve is normal in structure. Trivial mitral valve regurgitation. No evidence of mitral stenosis.  4. The aortic valve was not well visualized. Aortic valve regurgitation is trivial. No aortic stenosis is present.  5. The inferior vena cava is normal in size with greater than 50% respiratory variability, suggesting right atrial pressure of 3 mmHg. FINDINGS  Left Ventricle: Left ventricular ejection fraction, by estimation, is 30 to 35%. The left ventricle has moderately decreased function. The left ventricle demonstrates regional wall motion abnormalities. Definity  contrast agent was given IV to delineate the left ventricular endocardial borders. The left ventricular internal cavity size was normal in size. There is no left ventricular hypertrophy. Left ventricular diastolic  parameters are consistent with Grade I diastolic dysfunction (impaired relaxation). Right Ventricle: The right ventricular size is normal. No increase in right ventricular wall thickness. Right ventricular systolic function is mildly reduced. There is normal pulmonary artery systolic pressure. The tricuspid regurgitant velocity is 2.55 m/s, and with an assumed right atrial pressure of 3 mmHg, the estimated right ventricular systolic pressure is 29.0 mmHg. Left Atrium: Left atrial size was normal in size. Right Atrium: Right atrial size was normal in size. Pericardium: There is no evidence of pericardial effusion. Mitral Valve: The mitral valve is normal in structure. There is mild calcification of the mitral valve leaflet(s). Mild mitral annular calcification. Trivial mitral valve regurgitation. No evidence of mitral valve stenosis. MV peak gradient, 5.3 mmHg. The mean mitral valve gradient is 2.0 mmHg. Tricuspid Valve: The tricuspid valve is normal in structure. Tricuspid valve regurgitation is trivial. Aortic Valve: The aortic valve was not well visualized. Aortic valve regurgitation is trivial. No aortic stenosis is present. Aortic valve peak gradient measures 7.5 mmHg. Pulmonic Valve: The pulmonic valve was normal in structure. Pulmonic valve regurgitation is not visualized.  Aorta: The aortic root is normal in size and structure. Venous: The inferior vena cava is normal in size with greater than 50% respiratory variability, suggesting right atrial pressure of 3 mmHg. IAS/Shunts: No atrial level shunt detected by color flow Doppler.  LEFT VENTRICLE PLAX 2D LVOT diam:     2.10 cm   Diastology LV SV:         66        LV e' medial:    5.00 cm/s LV SV Index:   32        LV E/e' medial:  10.4 LVOT Area:     3.46 cm  LV e' lateral:   11.20 cm/s                          LV E/e' lateral: 4.7  RIGHT VENTRICLE          IVC RV Basal diam:  4.30 cm  IVC diam: 1.30 cm LEFT ATRIUM             Index        RIGHT ATRIUM            Index LA Vol (A2C):   49.9 ml 24.53 ml/m  RA Area:     16.30 cm LA Vol (A4C):   50.4 ml 24.77 ml/m  RA Volume:   38.10 ml  18.73 ml/m LA Biplane Vol: 51.9 ml 25.51 ml/m  AORTIC VALVE AV Area (Vmax): 2.91 cm AV Vmax:        137.00 cm/s AV Peak Grad:   7.5 mmHg LVOT Vmax:      115.00 cm/s LVOT Vmean:     76.400 cm/s LVOT VTI:       0.190 m  AORTA Ao Root diam: 3.40 cm Ao Asc diam:  3.20 cm MITRAL VALVE                TRICUSPID VALVE MV Area (PHT): 2.10 cm     TR Peak grad:   26.0 mmHg MV Area VTI:   2.54 cm     TR Vmax:        255.00 cm/s MV Peak grad:  5.3 mmHg MV Mean grad:  2.0 mmHg     SHUNTS MV Vmax:       1.15 m/s     Systemic VTI:  0.19 m MV Vmean:      68.6 cm/s    Systemic Diam: 2.10 cm MV Decel Time: 361 msec MV E velocity: 52.10 cm/s MV A velocity: 110.00 cm/s MV E/A ratio:  0.47 Dalton McleanMD Electronically signed by Archer Bear Signature Date/Time: 08/06/2023/1:48:52 PM    Final    MR BRAIN WO CONTRAST Result Date: 08/06/2023 CLINICAL DATA:  78 year old male neurologic deficit. Code stroke presentation yesterday. Right perirolandic ischemia suspected on CT and CT P. EXAM: MRI HEAD WITHOUT CONTRAST TECHNIQUE: Multiplanar, multiecho pulse sequences of the brain and surrounding structures were obtained without intravenous contrast. COMPARISON:  Head CT, CTA, CTP yesterday. FINDINGS: Brain: Patchy, confluent 3 cm area of restricted diffusion in the right lateral motor strip, posterior frontal operculum tracking toward the right upper extremity representation area. Faint additional pre motor diffusion restriction, and somewhat separate 1.5 cm area of confluent restricted diffusion in the anterior right corona radiata near the caudate. These correspond to the earlier CT findings. There is heterogeneous T2 and FLAIR hyperintense cytotoxic edema. No hemorrhagic transformation. No mass effect. Superimposed chronic encephalomalacia throughout the lateral right  temporal lobe, posterior and lateral  right occipital lobe, inferior right parietal lobe. Small area of chronic right inferior frontal gyrus encephalomalacia. Moderate T2 and FLAIR heterogeneity throughout the bilateral deep gray nuclei appears to be post ischemic also and there is a chronic microhemorrhage in the left thalamus. Chronic small left cerebellar infarct with encephalomalacia. Tiny chronic right cerebellar infarct. No midline shift, mass effect, evidence of mass lesion, ventriculomegaly, extra-axial collection or acute intracranial hemorrhage. Cervicomedullary junction and pituitary are within normal limits. Vascular: Major intracranial vascular flow voids are preserved. Skull and upper cervical spine: Negative. Visualized bone marrow signal is within normal limits. Sinuses/Orbits: Rightward gaze.  Paranasal sinuses well aerated. Other: Mild bilateral mastoid effusions. Trace retained secretions in the nasopharynx. IMPRESSION: 1. Confirmed patchy acute infarcts in the Right MCA territory, most confluent along the right lateral motor strip. No hemorrhagic transformation or mass effect. 2. Underlying advanced chronic small and medium-sized vessel ischemic disease, possibly with some superimposed chronic posttraumatic encephalomalacia. Electronically Signed   By: Marlise Simpers M.D.   On: 08/06/2023 05:44   DG Chest Portable 1 View Result Date: 08/05/2023 CLINICAL DATA:  Cough code stroke EXAM: PORTABLE CHEST 1 VIEW COMPARISON:  07/28/2023 FINDINGS: Mildly diminished lung volumes. No consolidation, pleural effusion or pneumothorax. Stable cardiomediastinal silhouette with aortic atherosclerosis. No pneumothorax IMPRESSION: No active disease. Mildly diminished lung volumes. Electronically Signed   By: Esmeralda Hedge M.D.   On: 08/05/2023 23:12   CT HEAD CODE STROKE WO CONTRAST Addendum Date: 08/05/2023 ADDENDUM REPORT: 08/05/2023 22:36 ADDENDUM: Findings discussed with Dr. Lindzen via telephone at 10:33 p.m. Electronically Signed   By: Stevenson Elbe M.D.   On: 08/05/2023 22:36   Result Date: 08/05/2023 CLINICAL DATA:  Code stroke.  Neuro deficit, acute, stroke suspected EXAM: CT HEAD WITHOUT CONTRAST TECHNIQUE: Contiguous axial images were obtained from the base of the skull through the vertex without intravenous contrast. RADIATION DOSE REDUCTION: This exam was performed according to the departmental dose-optimization program which includes automated exposure control, adjustment of the mA and/or kV according to patient size and/or use of iterative reconstruction technique. COMPARISON:  CT head April 12, 25. FINDINGS: Brain: Similar encephalomalacia in the right frontal, parietal, occipital and temporal lobes. Similar patchy white matter hypodensities. Similar remote left cerebellar infarct. New area of low attenuation and loss of gray white differentiation in the perirolandic posterior right frontal lobe, suspicious for acute infarct. No evidence of acute hemorrhage, midline shift, mass lesion or hydrocephalus. Vascular: No hyperdense vessel. Skull: No acute fracture. Sinuses/Orbits: Clear sinuses.  No acute orbital findings. ASPECTS ([place] Stroke Program Early CT Score) - Ganglionic level infarction (caudate, lentiform nuclei, internal capsule, insula, M1-M3 cortex): 7 - Supraganglionic infarction (M4-M6 cortex): 1-2 Total score (0-10 with 10 being normal): 8-9 Dr. Lindzen has been paged at the time of dictation for call of report. IMPRESSION: 1. Findings suspicious for acute infarct in the perirolandic posterior right frontal lobe, suspicious for acute infarct. ASPECTS is 8-9. 2. No acute hemorrhage. 3. Similar extensive remote infarcts. Electronically Signed: By: Stevenson Elbe M.D. On: 08/05/2023 22:05   CT ANGIO HEAD NECK W WO CM W PERF (CODE STROKE) Result Date: 08/05/2023 CLINICAL DATA:  Neuro deficit, acute, stroke suspected EXAM: CT ANGIOGRAPHY HEAD AND NECK CT PERFUSION BRAIN TECHNIQUE: Multidetector CT imaging of the head and neck  was performed using the standard protocol during bolus administration of intravenous contrast. Multiplanar CT image reconstructions and MIPs were obtained to evaluate the vascular anatomy. Carotid stenosis measurements (when  applicable) are obtained utilizing NASCET criteria, using the distal internal carotid diameter as the denominator. Multiphase CT imaging of the brain was performed following IV bolus contrast injection. Subsequent parametric perfusion maps were calculated using RAPID software. RADIATION DOSE REDUCTION: This exam was performed according to the departmental dose-optimization program which includes automated exposure control, adjustment of the mA and/or kV according to patient size and/or use of iterative reconstruction technique. CONTRAST:  OMNIPAQUE  IOHEXOL  350 MG/ML SOLN COMPARISON:  Same day CT head. FINDINGS: CTA NECK FINDINGS Aortic arch: Aortic atherosclerosis. Great vessel origins are incompletely imaged. Visualized great vessel origins are patent. Moderate left subclavian artery stenosis. Right carotid system: Atherosclerosis at the carotid bifurcation involving the proximal ICA with approximately 30% stenosis relative to the distal vessel. Left carotid system: Common carotid artery origin is not imaged. Atherosclerosis at the carotid bifurcation without greater than 50% stenosis. Vertebral arteries: Severe left vertebral artery origin stenosis. Mildly diminished opacification of the more distal vertebral artery. Right vertebral arteries patent without greater than 50% stenosis. Skeleton: No acute abnormality on limited assessment. Other neck: No acute abnormality on limited assessment. Upper chest: Visualized lung apices are clear. Review of the MIP images confirms the above findings CTA HEAD FINDINGS Anterior circulation: Hypoplastic right A1 ACA, likely congenital. Otherwise, bilateral the intracranial ICAs, MCAs, and ACAs are patent without proximal high-grade stenosis. Posterior  circulation: Diminished opacification of the left intradural vertebral artery. Moderate left and mild right intradural vertebral artery stenosis due to atherosclerosis. The basilar artery and bilateral posterior cerebral arteries are patent. Severe left P2 PCA stenosis. Venous sinuses: Not well assessed arterial timing. Review of the MIP images confirms the above findings CT Brain Perfusion Findings: CBF (<30%) Volume: 0mL Perfusion (Tmax>6.0s) volume: 4mL Mismatch Volume: 4mL Infarction Location:No core infarct identified. IMPRESSION: CTA: 1. No emergent large vessel occlusion. 2. Severe left P2 PCA stenosis. 3. Severe left vertebral artery origin stenosis with diminished opacification of the more distal vertebral artery. 4. Moderate left intradural vertebral artery stenosis. 5. Moderate left subclavian artery stenosis. 6. Aortic Atherosclerosis (ICD10-I70.0). CT perfusion: 1. RAPID reports approximately 4 mL of mismatch/penumbra in the posterior right frontal area, which correlates with findings on CT head. 2. No evidence of core infarct by CT perfusion. MRI could further evaluate. Findings discussed with Dr. Lindzen via telephone at 10:33 p.m. Electronically Signed   By: Stevenson Elbe M.D.   On: 08/05/2023 22:36     Signature  -   Lynnwood Sauer M.D on 08/07/2023 at 7:54 AM   -  To page go to [email or web address]

## 2023-08-07 NOTE — Evaluation (Addendum)
 Physical Therapy Evaluation Patient Details Name: [name] MRN: [id number] DOB: 04/01/1946 Today's Date: 08/07/2023  History of Present Illness  Pt is a 78 yo male that presents 4/20 from SNF rehab with L sided weakness and speech difficulties. MRI brain showed acute infarcts in R MCA territory. Recent admissions 4/12-4/16 after fall, L hip fx and surgical repair. Admission in March d/t sepsis and PNA. PMH of COPD stage II, anxiety, stroke, neuromuscular disorder,depression, HTN, prostate cancer s/p seed implantation, HLD, lumbar disc disease, CAD, AAA , intraparenchymal hemorrhage in 02/2021, , nephrolithiasis, GERD, tobacco use and chronic ischemic heart failure.   Clinical Impression  Pt admitted with above diagnosis. 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 Required up to mod assist +2 for sit to stand and step pivot transfer to recliner today. Tolerated WB through LLE without buckle however requires assist for balance due to heavy posterior lean, and physical assist to abduction LLE to step towards recliner. Pt happy to be out of bed. Recommended to nursing to use Steady for transfers during admission. Pt currently with functional limitations due to the deficits listed below (see PT Problem List). Pt will benefit from acute skilled PT to increase their independence and safety with mobility to allow discharge.           If plan is discharge home, recommend the following: Two people to help with walking and/or transfers;Two people to help with bathing/dressing/bathroom;Assist for transportation;Assistance with cooking/housework   Can travel by private vehicle   No    Equipment Recommendations None recommended by PT;Other (comment) (Or TBD back at [facility])  Recommendations for Other Services       Functional Status Assessment Patient has had a recent decline in their functional status and  demonstrates the ability to make significant improvements in function in a reasonable and predictable amount of time.     Precautions / Restrictions Precautions Precautions: Fall Recall of Precautions/Restrictions: Impaired Restrictions Weight Bearing Restrictions Per Provider Order: Yes LLE Weight Bearing Per Provider Order: Weight bearing as tolerated      Mobility  Bed Mobility Overal bed mobility: Needs Assistance Bed Mobility: Supine to Sit     Supine to sit: Mod assist, HOB elevated     General bed mobility comments: Mod assist to boost trunk to EOB, brings LEs off bed with cues.    Transfers Overall transfer level: Needs assistance Equipment used: Rolling walker (2 wheels) Transfers: Sit to/from Stand, Bed to chair/wheelchair/BSC Sit to Stand: Mod assist, +2 physical assistance, +2 safety/equipment   Step pivot transfers: Mod assist, +2 physical assistance, +2 safety/equipment       General transfer comment: Mod assist +2 for boost and balance, RW for support, assisted to place and sustain LUE on device.  Mod assist +2 to step pivot to recliner, manual assist to correct posterior lean and facilitate LLE step towards left with each step. Multimodal cues.    Ambulation/Gait                  Stairs            Wheelchair Mobility     Tilt Bed    Modified Rankin (Stroke Patients Only) Modified Rankin (Stroke Patients Only) Pre-Morbid Rankin Score: Severe disability Modified Rankin: Severe disability     Balance Overall balance assessment: Needs assistance Sitting-balance support: Feet supported, No upper extremity supported, Single extremity supported Sitting balance-Leahy Scale:  Fair   Postural control: Posterior lean Standing balance support: Bilateral upper extremity supported, During functional activity Standing balance-Leahy Scale: Poor                               Pertinent Vitals/Pain Pain Assessment Pain Assessment:  Faces Faces Pain Scale: Hurts a little bit Pain Location: L hip Pain Descriptors / Indicators: Grimacing, Guarding, Sore Pain Intervention(s): Monitored during session, Repositioned    Home Living Family/patient expects to be discharged to:: Skilled nursing facility Living Arrangements: Alone Available Help at Discharge: Family;Available PRN/intermittently Type of Home: Mobile home Home Access: Stairs to enter;Ramped entrance Entrance Stairs-Rails: Can reach both Entrance Stairs-Number of Steps: 4   Home Layout: One level Home Equipment: Agricultural consultant (2 wheels);Cane - single point Additional Comments: step sons bring food and drive to appointments if needed, checks on him nightly; pt at [facility] for rehab after recent hospitalization s/p L hip fx/sx    Prior Function Prior Level of Function : Independent/Modified Independent             Mobility Comments: previously Mod I using RW. reports working on standing/transfers at [facility] rehab ADLs Comments: Previously Mod I for ADLs w/ assist for IADLs. Pt reports assistance with ADLs at SNF rehab, not transferring onto toilet yet per pt     Extremity/Trunk Assessment   Upper Extremity Assessment Upper Extremity Assessment: Defer to OT evaluation LUE Deficits / Details: difficulty maintaining grasp on RW with this UE with pt seemingly unaware. able to assist in reaching up to RW. unable to fully assess LUE functions as pt dozing off by end of session. LUE Sensation: decreased proprioception LUE Coordination: decreased fine motor;decreased gross motor    Lower Extremity Assessment Lower Extremity Assessment: LLE deficits/detail RLE Deficits / Details: Temnding to adduct RLE in sitting EOB LLE Deficits / Details: hip eversion at rest, mild adductor tone. Unable to fully extend Lt knee against gravity but bears weight with UE support on RW.    Cervical / Trunk Assessment Cervical / Trunk Assessment: Normal Cervical / Trunk  Exceptions: Forward head posture with rounded shoulders  Communication   Communication Communication: Impaired Factors Affecting Communication: Reduced clarity of speech    Cognition Arousal: Alert, Lethargic Behavior During Therapy: Flat affect, WFL for tasks assessed/performed   PT - Cognitive impairments: No family/caregiver present to determine baseline                         Following commands: Impaired Following commands impaired: Follows one step commands with increased time, Only follows one step commands consistently     Cueing Cueing Techniques: Verbal cues, Tactile cues     General Comments General comments (skin integrity, edema, etc.): SpO2 91-92% on 5 L O2    Exercises General Exercises - Lower Extremity Long Arc Quad: Strengthening, Left, 10 reps, Seated Other Exercises Other Exercises: Hip internal rotation to neutral, seated with LE extended.  Also repositioned at end of ession to facilitate neutral alignment.   Assessment/Plan    PT Assessment Patient needs continued PT services  PT Problem List Decreased strength;Decreased range of motion;Decreased activity tolerance;Decreased balance;Decreased mobility;Decreased knowledge of use of DME;Decreased safety awareness;Decreased knowledge of precautions       PT Treatment Interventions DME instruction;Gait training;Functional mobility training;Therapeutic activities;Therapeutic exercise;Balance training;Patient/family education    PT Goals (Current goals can be found in the Care Plan section)  Acute  Rehab PT Goals Patient Stated Goal: Return to rehab PT Goal Formulation: With patient Time For Goal Achievement: 08/21/23 Potential to Achieve Goals: Fair    Frequency Min 2X/week     Co-evaluation PT/OT/SLP Co-Evaluation/Treatment: Yes Reason for Co-Treatment: For patient/therapist safety;To address functional/ADL transfers PT goals addressed during session: Mobility/safety with  mobility;Balance;Proper use of DME;Strengthening/ROM OT goals addressed during session: ADL's and self-care;Strengthening/ROM;Proper use of Adaptive equipment and DME       AM-PAC PT "6 Clicks" Mobility  Outcome Measure Help needed turning from your back to your side while in a flat bed without using bedrails?: A Lot Help needed moving from lying on your back to sitting on the side of a flat bed without using bedrails?: A Lot Help needed moving to and from a bed to a chair (including a wheelchair)?: Total Help needed standing up from a chair using your arms (e.g., wheelchair or bedside chair)?: Total Help needed to walk in hospital room?: Total Help needed climbing 3-5 steps with a railing? : Total 6 Click Score: 8    End of Session Equipment Utilized During Treatment: Gait belt;Oxygen  Activity Tolerance: Patient tolerated treatment well Patient left: in chair;with call bell/phone within reach;with chair alarm set Nurse Communication: Mobility status;Need for lift equipment (Stedy +2 assist) PT Visit Diagnosis: Muscle weakness (generalized) (M62.81);History of falling (Z91.81);Pain;Other symptoms and signs involving the nervous system (R29.898);Unsteadiness on feet (R26.81);Difficulty in walking, not elsewhere classified (R26.2);Hemiplegia and hemiparesis Hemiplegia - Right/Left: Left Pain - Right/Left: Left Pain - part of body: Hip    Time: 1478-2956 PT Time Calculation (min) (ACUTE ONLY): 23 min   Charges:   PT Evaluation $PT Eval Moderate Complexity: 1 Mod   PT General Charges $$ ACUTE PT VISIT: 1 Visit         Jory Ng, PT, DPT [facility]  Rehabilitation Services Physical Therapist Office: [phone] Website: [email or web address]   Alinda Irani 08/07/2023, 12:31 PM

## 2023-08-07 NOTE — Progress Notes (Signed)
 STROKE TEAM PROGRESS NOTE   INTERIM HISTORY/SUBJECTIVE  Patient lying in bed, no family at bedside.  He is doing better today.  He is more interactive.  He is able to speak with mild dysarthria.  Speech therapy has not yet cleared him for a diet.  He is able to move all 4 extremities with only mild left-sided weakness Echocardiogram showed ejection fraction of 30 to 35% with no clot.  Left atrial size is normal. OBJECTIVE  CBC    Component Value Date/Time   WBC 8.4 08/07/2023 0427   RBC 3.95 (L) 08/07/2023 0427   HGB 12.2 (L) 08/07/2023 0427   HGB 14.4 12/19/2022 1039   HCT 38.0 (L) 08/07/2023 0427   HCT 43.8 12/19/2022 1039   PLT 262 08/07/2023 0427   PLT 200 12/19/2022 1039   MCV 96.2 08/07/2023 0427   MCV 96 12/19/2022 1039   MCV 93 02/27/2012 1406   MCH 30.9 08/07/2023 0427   MCHC 32.1 08/07/2023 0427   RDW 13.5 08/07/2023 0427   RDW 11.8 12/19/2022 1039   RDW 12.9 02/27/2012 1406   LYMPHSABS 1.2 08/05/2023 2141   LYMPHSABS 2.1 02/27/2012 1406   MONOABS 0.8 08/05/2023 2141   MONOABS 0.6 02/27/2012 1406   EOSABS 0.4 08/05/2023 2141   EOSABS 0.4 02/27/2012 1406   BASOSABS 0.1 08/05/2023 2141   BASOSABS 0.1 02/27/2012 1406    BMET    Component Value Date/Time   NA 142 08/05/2023 2154   NA 145 (H) 04/13/2023 1550   NA 145 04/23/2011 0252   K 3.8 08/05/2023 2154   K 3.9 04/23/2011 0252   CL 105 08/05/2023 2143   CL 107 04/23/2011 0252   CO2 26 08/05/2023 2141   CO2 31 04/23/2011 0252   GLUCOSE 133 (H) 08/05/2023 2143   GLUCOSE 93 04/23/2011 0252   BUN 28 (H) 08/05/2023 2143   BUN 15 04/13/2023 1550   BUN 16 04/23/2011 0252   CREATININE 1.50 (H) 08/05/2023 2143   CREATININE 1.12 10/03/2011 1625   CALCIUM  8.7 (L) 08/05/2023 2141   CALCIUM  8.6 04/23/2011 0252   EGFR 67 04/13/2023 1550   GFRNONAA 54 (L) 08/05/2023 2141   GFRNONAA 68 10/03/2011 1625    IMAGING past 24 hours ECHOCARDIOGRAM COMPLETE Result Date: 08/06/2023    ECHOCARDIOGRAM REPORT   Patient  Name:   [name] Date of Exam: 08/06/2023 Medical Rec #:  [id number]      Height:       72.0 in Accession #:    [id number]     Weight:       179.5 lb Date of Birth:  04/03/1946      BSA:          2.035 m Patient Age:    78 years       BP:           118/76 mmHg Patient Gender: M              HR:           59 bpm. Exam Location:  Inpatient Procedure: 2D Echo, Color Doppler, Cardiac Doppler and Intracardiac            Opacification Agent (Both Spectral and Color Flow Doppler were            utilized during procedure). Indications:    Stroke  History:        Patient has prior history of Echocardiogram examinations.  Previous Myocardial Infarction, Stroke; Risk Factors:Current                 Smoker, Hypertension and HLD.  Sonographer:    Willey Harrier Referring Phys: [id number] TIMOTHY S OPYD  Sonographer Comments: Suboptimal parasternal window and Technically difficult study due to poor echo windows. IMPRESSIONS  1. Left ventricular ejection fraction, by estimation, is 30 to 35%. The left ventricle has moderately decreased function. The left ventricle demonstrates regional wall motion abnormalities with severe hypokinesis of the anterosepl, anterior, and inferior walls as well as the apex. No LV thrombus noted. Left ventricular diastolic parameters are consistent with Grade I diastolic dysfunction (impaired relaxation).  2. Right ventricular systolic function is mildly reduced. The right ventricular size is normal. There is normal pulmonary artery systolic pressure. The estimated right ventricular systolic pressure is 29.0 mmHg.  3. The mitral valve is normal in structure. Trivial mitral valve regurgitation. No evidence of mitral stenosis.  4. The aortic valve was not well visualized. Aortic valve regurgitation is trivial. No aortic stenosis is present.  5. The inferior vena cava is normal in size with greater than 50% respiratory variability, suggesting right atrial pressure of 3 mmHg. FINDINGS  Left  Ventricle: Left ventricular ejection fraction, by estimation, is 30 to 35%. The left ventricle has moderately decreased function. The left ventricle demonstrates regional wall motion abnormalities. Definity  contrast agent was given IV to delineate the left ventricular endocardial borders. The left ventricular internal cavity size was normal in size. There is no left ventricular hypertrophy. Left ventricular diastolic parameters are consistent with Grade I diastolic dysfunction (impaired relaxation). Right Ventricle: The right ventricular size is normal. No increase in right ventricular wall thickness. Right ventricular systolic function is mildly reduced. There is normal pulmonary artery systolic pressure. The tricuspid regurgitant velocity is 2.55 m/s, and with an assumed right atrial pressure of 3 mmHg, the estimated right ventricular systolic pressure is 29.0 mmHg. Left Atrium: Left atrial size was normal in size. Right Atrium: Right atrial size was normal in size. Pericardium: There is no evidence of pericardial effusion. Mitral Valve: The mitral valve is normal in structure. There is mild calcification of the mitral valve leaflet(s). Mild mitral annular calcification. Trivial mitral valve regurgitation. No evidence of mitral valve stenosis. MV peak gradient, 5.3 mmHg. The mean mitral valve gradient is 2.0 mmHg. Tricuspid Valve: The tricuspid valve is normal in structure. Tricuspid valve regurgitation is trivial. Aortic Valve: The aortic valve was not well visualized. Aortic valve regurgitation is trivial. No aortic stenosis is present. Aortic valve peak gradient measures 7.5 mmHg. Pulmonic Valve: The pulmonic valve was normal in structure. Pulmonic valve regurgitation is not visualized. Aorta: The aortic root is normal in size and structure. Venous: The inferior vena cava is normal in size with greater than 50% respiratory variability, suggesting right atrial pressure of 3 mmHg. IAS/Shunts: No atrial level  shunt detected by color flow Doppler.  LEFT VENTRICLE PLAX 2D LVOT diam:     2.10 cm   Diastology LV SV:         66        LV e' medial:    5.00 cm/s LV SV Index:   32        LV E/e' medial:  10.4 LVOT Area:     3.46 cm  LV e' lateral:   11.20 cm/s  LV E/e' lateral: 4.7  RIGHT VENTRICLE          IVC RV Basal diam:  4.30 cm  IVC diam: 1.30 cm LEFT ATRIUM             Index        RIGHT ATRIUM           Index LA Vol (A2C):   49.9 ml 24.53 ml/m  RA Area:     16.30 cm LA Vol (A4C):   50.4 ml 24.77 ml/m  RA Volume:   38.10 ml  18.73 ml/m LA Biplane Vol: 51.9 ml 25.51 ml/m  AORTIC VALVE AV Area (Vmax): 2.91 cm AV Vmax:        137.00 cm/s AV Peak Grad:   7.5 mmHg LVOT Vmax:      115.00 cm/s LVOT Vmean:     76.400 cm/s LVOT VTI:       0.190 m  AORTA Ao Root diam: 3.40 cm Ao Asc diam:  3.20 cm MITRAL VALVE                TRICUSPID VALVE MV Area (PHT): 2.10 cm     TR Peak grad:   26.0 mmHg MV Area VTI:   2.54 cm     TR Vmax:        255.00 cm/s MV Peak grad:  5.3 mmHg MV Mean grad:  2.0 mmHg     SHUNTS MV Vmax:       1.15 m/s     Systemic VTI:  0.19 m MV Vmean:      68.6 cm/s    Systemic Diam: 2.10 cm MV Decel Time: 361 msec MV E velocity: 52.10 cm/s MV A velocity: 110.00 cm/s MV E/A ratio:  0.47 Dalton McleanMD Electronically signed by Archer Bear Signature Date/Time: 08/06/2023/1:48:52 PM    Final     Vitals:   08/07/23 0353 08/07/23 0500 08/07/23 0743 08/07/23 0800  BP: 101/88   126/70  Pulse:      Resp:      Temp: (!) 97.5 F (36.4 C)   97.8 F (36.6 C)  TempSrc: Oral     SpO2:   96% 96%  Weight:  67.8 kg       PHYSICAL EXAM General: Drowsy, poorly nourished-appearing, elderly patient CV: Regular rate and rhythm on monitor Respiratory:  Regular, unlabored respirations on room air GI: Abdomen soft and nontender   NEURO:  Mental Status: Patient is awake alert and interactive today.  Follows commands well Speech/Language: Slightly nonfluent speech.  Dysarthric.    Cranial Nerves:  II: PERRL. UTA VF.  III, IV, VI: Right gaze preference, does cross midline with intermittent tracking.  No blink to threat on left.  V: Sensation is intact to face. UTA symmetry of sensation.  VII: Left facial droop VIII: hearing intact to voice. IX, X: Palate elevates symmetrically. Phonation is normal.  ZO:[id number] shrug 5/5. XII: tongue protrusion midline Motor:  Weak LUE, mild drift present Tone: is normal and bulk is decreased Sensation- Withdraws n all extremities, Less in LUE.  Coordination: uta/uncooperative with assessment.  Gait- deferred  Most Recent NIH: 9    ASSESSMENT/PLAN  [name] is a 78 y.o. male with PMHx of AAA, prior traumatic ICH, prior stroke with chronic vision deficit, COPD, CAD, kidney stones, prostate cancer, HLD, HTN, PVD, recent PNA and recent left hip fracture s/p left ORIF last week, who presents to the ED from his rehab facility after new onset of  left sided weakness and dysarthria. Family had called EMS to pick the patient up at the facility and bring him to the ED. EMS VS 124/80, pulse 81, cbg 138. EMS also reported having to remove food from mouth on arrival and noted that he was weak on the left, with right gaze preference, left facial droop and slurred speech. Code Stroke was called upon arrival to the ED. Staff at the facility were unable to provide more details regarding LKN requiring reliance upon family for the history. Due to some confusion and disorientation, the patient also was unable to provide a reliable LKN. NIH on Admission: 11.  Acute Ischemic Infarcts: right MCA territory  Etiology:  Embolic likely Yes yeah--strong suspicion for Atrial Fibrillation  Code Stroke CT head:  Suspicious for acute infarct in the perirolandic posterior right frontal lobe Aspects 8-9 No acute hemorrhage Similar extensive remote infarcts CTA head & neck No emergent LVO Severe left P2 stenosis Severe left vertebral artery origin  stenosis CT perfusion  4 mm of mismatch/penumbra in posterior right frontal area No evidence of core infarct MRI   Confirmed patchy acute infarcts in the Right MCA territory, most confluent along the right lateral motor strip.  No hemorrhagic transformation or mass effect. Underlying advanced chronic small and medium-sized vessel ischemic disease, possibly with some superimposed chronic posttraumatic encephalomalacia Chronic small left cerebellar infarct with encephalomalacia Chronic microhemorrhage left thalamus Tiny chronic right cerebellar infarct  2D Echo: LVEF 30 to 35%, severe hypokinesis LV, trivial MVR Recommend 30-day monitor LDL 76 HgbA1c 5.2 VTE prophylaxis - lovenox  aspirin  81 mg daily prior to admission, now on aspirin  81 mg daily and clopidogrel  75 mg daily for 3 weeks and then plavix  alone. Therapy recommendations:  SNF Disposition:  pending  Hx of Stroke/TIA 03/2021: Acute R temporal IPH 03/2021 Chronic R convexity SDH  Negative EEG On this admission's MRI: Chronic small left cerebellar infarct with encephalomalacia Chronic microhemorrhage left thalamus Tiny chronic right cerebellar infarct  Chronic HFrEF Hypertension Home meds: Lasix , spironolactone  Stable Blood Pressure Goal: BP less than 220/110 permissive hypertension for 24 to 48 hours from symptom onset, then gradually normalize Avoid hypotension Diuretics held Daily weights Strict I&Os  Hyperlipidemia Home meds: Lipitor  80 mg, resumed in hospital LDL 76, goal < 70 Continue statin at discharge, encourage compliance/Facility administration?  Tobacco Abuse Patient is a current smoker      Ready to quit? N/A Nicotine  replacement therapy provided  Dysphagia Patient has post-stroke dysphagia, SLP consulted    Diet   Diet NPO time specified Except for: Ice Chips   Advance diet as tolerated  Other Stroke Risk Factors Coronary artery disease ASA nad Lipitor  home medications  Other Active  Problems Hx of COPD and chronic hypoxic respiratory failure ? Malnutrition, Cachexia History of abdominal aortic aneurysm 2019 History of kidney stones History of prostate cancer  Hospital day # 2    He presented with sudden onset of dysarthria and left-sided weakness and right gaze preference embolic right MCA infarct.  He has prior history of traumatic ICH and stroke with residual deficits and poor baseline functioning.  MRI scan shows several previous embolic strokes raising strong suspicion for atrial fibrillation.  Recommend outpatient 30-day heart monitoring at discharge.  Aspirin  and Plavix  for 3 weeks followed by Plavix  alone and aggressive risk factor modification.  No family member at the bedside for discussion.  Discussed with Dr. Zelda Hickman.  Stroke team will sign off.  Follow-up as an outpatient stroke clinic in 2 months.  Greater than 50% time during this  35-minute visit spent in counseling and coordination of care and discussion with patient and care team and answering questions.  Ardella Beaver, MD Medical Director [facility] Pager: [phone] 08/07/2023 11:03 AM  To contact Stroke Continuity provider, please refer to [email or web address]. After hours, contact General Neurology

## 2023-08-07 NOTE — Plan of Care (Signed)
  Problem: Education: Goal: Knowledge of disease or condition will improve Outcome: Progressing Goal: Knowledge of secondary prevention will improve (MUST DOCUMENT ALL) Outcome: Progressing Goal: Knowledge of patient specific risk factors will improve (DELETE if not current risk factor) Outcome: Progressing   Problem: Ischemic Stroke/TIA Tissue Perfusion: Goal: Complications of ischemic stroke/TIA will be minimized Outcome: Progressing   Problem: Coping: Goal: Will verbalize positive feelings about self Outcome: Progressing Goal: Will identify appropriate support needs Outcome: Progressing   Problem: Health Behavior/Discharge Planning: Goal: Ability to manage health-related needs will improve Outcome: Progressing Goal: Goals will be collaboratively established with patient/family Outcome: Progressing   Problem: Self-Care: Goal: Ability to participate in self-care as condition permits will improve Outcome: Progressing Goal: Verbalization of feelings and concerns over difficulty with self-care will improve Outcome: Progressing Goal: Ability to communicate needs accurately will improve Outcome: Progressing   Problem: Nutrition: Goal: Risk of aspiration will decrease Outcome: Progressing Goal: Dietary intake will improve Outcome: Progressing   Problem: Education: Goal: Knowledge of General Education information will improve Description: Including pain rating scale, medication(s)/side effects and non-pharmacologic comfort measures Outcome: Progressing   Problem: Health Behavior/Discharge Planning: Goal: Ability to manage health-related needs will improve Outcome: Progressing   Problem: Clinical Measurements: Goal: Ability to maintain clinical measurements within normal limits will improve Outcome: Progressing Goal: Will remain free from infection Outcome: Progressing Goal: Diagnostic test results will improve Outcome: Progressing Goal: Respiratory complications will  improve Outcome: Progressing Goal: Cardiovascular complication will be avoided Outcome: Progressing   Problem: Activity: Goal: Risk for activity intolerance will decrease Outcome: Progressing   Problem: Coping: Goal: Level of anxiety will decrease Outcome: Progressing   Problem: Elimination: Goal: Will not experience complications related to bowel motility Outcome: Progressing Goal: Will not experience complications related to urinary retention Outcome: Progressing   Problem: Pain Managment: Goal: General experience of comfort will improve and/or be controlled Outcome: Progressing   Problem: Safety: Goal: Ability to remain free from injury will improve Outcome: Progressing   Problem: Skin Integrity: Goal: Risk for impaired skin integrity will decrease Outcome: Progressing

## 2023-08-07 NOTE — Evaluation (Signed)
 Modified Barium Swallow Study  Patient Details  Name: [name] MRN: [id number] Date of Birth: 09/30/1945  Today's Date: 08/07/2023  Modified Barium Swallow completed.  Full report located under Chart Review in the Imaging Section.  History of Present Illness Pt is a 78 y.o. male who presented to ED on 04/20 due to left-sided weakness and trouble speaking. Pt was recently recovering from pneumonia and recent hip surgery. MRI displayed "patchy acute infarcts in the Right MCA territory, most  confluent along the right lateral motor strip." PMHx: hypertension, hyperlipidemia, COPD, chronic hypoxic respiratory failure, AAA status post endovascular repair, CAD, HFmrEF, and left hip fracture status post hemiarthroplasty on 07/28/2023   Clinical Impression Pt presents with a moderate-severe oropharyngeal dysphagia (DIGEST Score: 2) primarily characterized by silent aspiration of thin-liquids and mildly-thick (nectar) liquids.   Oral phase was characterized by prolonged mastication, posterior escape of thin-liquid bolus, and trace, tongue residue. To note, pt did not have dentures in for this study, which may have contributed to prolonged mastication. Swallow initiation was varied for different consistencies, with pyriform initiation found with thin-liquids, compared to posterior angle of ramus initiation with honey-thick liquids. Pharyngeal phase featured partial hyolaryngeal excursion and a diminished pharyngeal stripping wave in superior-middle portion of pharynx. This combination was responsible for aspiration and pentration events. Silent penetration was noted with thin, nectar, and honey thick liquids. Pt did not experience coughing during all silent aspiration events and required verbal cueing to cough and throat clear. Cued coughs were weak and ineffective for clearing deep aspiration. Throat clearing proved efficient for clearing penetration at level of vocal folds. Collection of pharyngeal  residue was noted primarily in valleculae. Transit through [facility] appeared [facility]. Effortful swallow, multiple swallows, chin tuck, and liquid wash were effective for improving pharyngeal clearance, especially for airway safety.   Recommend dysphagia 1 (puree) and honey-thick liquid diet. Medications may be presented whole in puree. Small bites and sips, chin-tuck, throat clear after each swallow, and liquid wash are recommended as compensatory strategies to optimize airway protection for pt wellbeing. SLP will f/u to implement swallow strategies and initiate Expiratory Muscle Strength Training to improve cough strength.   Factors that may increase risk of adverse event in presence of aspiration Roderick Civatte & Jessy Morocco 2021): Weak cough;Poor general health and/or compromised immunity  Swallow Evaluation Recommendations Recommendations: PO diet PO Diet Recommendation: Dysphagia 1 (Pureed);Moderately thick liquids (Level 3, honey thick) Liquid Administration via: Cup Medication Administration: Whole meds with puree Supervision: Full supervision/cueing for swallowing strategies;Staff to assist with self-feeding Swallowing strategies  : Slow rate;Small bites/sips;effortful swallow;Multiple dry swallows after each bite/sip;Follow solids with liquids;Clear throat intermittently;Chin tuck;Minimize environmental distractions Postural changes: Position pt fully upright for meals Oral care recommendations: Oral care BID (2x/day)      Aurelia Leeks 08/07/2023,2:45 PM

## 2023-08-07 NOTE — NC FL2 (Signed)
 [place]  MEDICAID FL2 LEVEL OF CARE FORM     IDENTIFICATION  Patient Name: [name] Birthdate: Jan 06, 1946 Sex: male Admission Date (Current Location): 08/05/2023  [facility] and [place] Number:  Producer, television/film/video and Address:  [facility] [place], [address] [place], [place] [address]      Provider Number: [id number]  Attending Physician Name and Address:  Cala Castleman, MD  Relative Name and Phone Number:       Current Level of Care: Hospital Recommended Level of Care: Skilled Nursing Facility Prior Approval Number:    Date Approved/Denied:   PASRR Number: [id number]  Discharge Plan: SNF    Current Diagnoses: Patient Active Problem List   Diagnosis Date Noted   Dysphagia 08/06/2023   Acute ischemic stroke (HCC) 08/06/2023   Cerebrovascular accident (CVA) (HCC) 08/05/2023   Fracture of femoral neck, left (HCC) 07/28/2023   History of CAD (coronary artery disease) 07/28/2023   History of AAA (abdominal aortic aneurysm) repair 07/28/2023   Acute on chronic hypoxic respiratory failure (HCC) 12/13/2022   COPD (chronic obstructive pulmonary disease) (HCC) 12/12/2022   Acute hypoxemic respiratory failure (HCC) 12/12/2022   Acute on chronic combined systolic and diastolic CHF (congestive heart failure) (HCC) 04/24/2021   Anemia 04/24/2021   Acute on chronic respiratory failure with hypoxia (HCC) 04/24/2021   Incarcerated inguinal hernia 03/18/2021   Chronic systolic CHF (congestive heart failure) (HCC)    Fall at home, initial encounter 02/21/2021   History of stroke 02/21/2021   Elevated troponin 02/21/2021   Other spondylosis with radiculopathy, lumbar region 09/04/2018   Centrilobular emphysema (HCC) 07/19/2018   Dilated cardiomyopathy (HCC) 07/19/2018   Coronary artery disease of native artery of native heart with stable angina pectoris (HCC) 01/20/2018   AAA (abdominal aortic aneurysm) without rupture (HCC) 10/30/2017   COPD  exacerbation (HCC) 04/03/2015   Barrett's esophagus 05/20/2014   Constipation 05/20/2014   Arthritis of hand 05/07/2014   Pulmonary nodule 11/06/2011   Chronic hypoxic respiratory failure (HCC) 04/24/2011   Tobacco abuse 02/12/2011   CAD, NATIVE VESSEL/HLD 10/18/2008   COLONIC POLYPS 08/05/2008   Hyperlipidemia 08/05/2008   Essential hypertension 08/05/2008   GERD 08/05/2008    Orientation RESPIRATION BLADDER Height & Weight     Self, Place  O2 (4-5L nasal cannula) Incontinent, External catheter Weight: 149 lb 7.6 oz (67.8 kg) Height:     BEHAVIORAL SYMPTOMS/MOOD NEUROLOGICAL BOWEL NUTRITION STATUS      Continent    AMBULATORY STATUS COMMUNICATION OF NEEDS Skin   Extensive Assist Verbally Surgical wounds (incision prior)                       Personal Care Assistance Level of Assistance  Bathing, Feeding, Dressing Bathing Assistance: Maximum assistance Feeding assistance: Limited assistance Dressing Assistance: Maximum assistance     Functional Limitations Info  Sight Sight Info: Impaired        SPECIAL CARE FACTORS FREQUENCY  PT (By licensed PT), OT (By licensed OT)     PT Frequency: 5x/week OT Frequency: 5x/week            Contractures Contractures Info: Not present    Additional Factors Info  Code Status, Allergies Code Status Info: DNR Allergies Info: Lyrica           Current Medications (08/07/2023):  This is the current hospital active medication list Current Facility-Administered Medications  Medication Dose Route Frequency Provider Last Rate Last Admin   acetaminophen  (  TYLENOL ) tablet 650 mg  650 mg Oral Q4H PRN Opyd, Timothy S, MD   650 mg at 08/07/23 6213   Or   acetaminophen  (TYLENOL ) 160 MG/5ML solution 650 mg  650 mg Per Tube Q4H PRN Opyd, Timothy S, MD       Or   acetaminophen  (TYLENOL ) suppository 650 mg  650 mg Rectal Q4H PRN Opyd, Timothy S, MD       aspirin  EC tablet 81 mg  81 mg Oral Daily Opyd, Timothy S, MD   81 mg at  08/07/23 0865   atorvastatin  (LIPITOR ) tablet 80 mg  80 mg Oral Daily Opyd, Timothy S, MD   80 mg at 08/07/23 7846   clopidogrel  (PLAVIX ) tablet 75 mg  75 mg Oral Daily Lindzen, Eric, MD   75 mg at 08/07/23 9629   enoxaparin  (LOVENOX ) injection 40 mg  40 mg Subcutaneous Daily Opyd, Timothy S, MD   40 mg at 08/07/23 5284   ezetimibe  (ZETIA ) tablet 10 mg  10 mg Oral Daily Singh, Prashant K, MD   10 mg at 08/07/23 1324   HYDROcodone -acetaminophen  (NORCO/VICODIN) 5-325 MG per tablet 1 tablet  1 tablet Oral Q6H PRN Vita Grip, MD   1 tablet at 08/07/23 0841   ipratropium-albuterol  (DUONEB) 0.5-2.5 (3) MG/3ML nebulizer solution 3 mL  3 mL Nebulization Q4H PRN Opyd, Santana Cue, MD   3 mL at 08/07/23 0403   lactated ringers  infusion   Intravenous Continuous Singh, Prashant K, MD 100 mL/hr at 08/07/23 0836 New Bag at 08/07/23 0836   pantoprazole  (PROTONIX ) EC tablet 40 mg  40 mg Oral Daily Opyd, Timothy S, MD   40 mg at 08/07/23 4010   senna-docusate (Senokot-S) tablet 1 tablet  1 tablet Oral QHS PRN Opyd, Timothy S, MD       umeclidinium bromide  (INCRUSE ELLIPTA ) 62.5 MCG/ACT 1 puff  1 puff Inhalation Daily Opyd, Santana Cue, MD   1 puff at 08/07/23 2725     Discharge Medications: Please see discharge summary for a list of discharge medications.  Relevant Imaging Results:  Relevant Lab Results:   Additional Information SSN: [id number] [place], [place]

## 2023-08-08 DIAGNOSIS — I639 Cerebral infarction, unspecified: Secondary | ICD-10-CM | POA: Diagnosis not present

## 2023-08-08 LAB — CBC WITH DIFFERENTIAL/PLATELET
Abs Immature Granulocytes: 0.07 10*3/uL (ref 0.00–0.07)
Basophils Absolute: 0 10*3/uL (ref 0.0–0.1)
Basophils Relative: 1 %
Eosinophils Absolute: 0.3 10*3/uL (ref 0.0–0.5)
Eosinophils Relative: 4 %
HCT: 37 % — ABNORMAL LOW (ref 39.0–52.0)
Hemoglobin: 11.7 g/dL — ABNORMAL LOW (ref 13.0–17.0)
Immature Granulocytes: 1 %
Lymphocytes Relative: 16 %
Lymphs Abs: 1.1 10*3/uL (ref 0.7–4.0)
MCH: 30.9 pg (ref 26.0–34.0)
MCHC: 31.6 g/dL (ref 30.0–36.0)
MCV: 97.6 fL (ref 80.0–100.0)
Monocytes Absolute: 0.7 10*3/uL (ref 0.1–1.0)
Monocytes Relative: 10 %
Neutro Abs: 4.6 10*3/uL (ref 1.7–7.7)
Neutrophils Relative %: 68 %
Platelets: 274 10*3/uL (ref 150–400)
RBC: 3.79 MIL/uL — ABNORMAL LOW (ref 4.22–5.81)
RDW: 13.4 % (ref 11.5–15.5)
WBC: 6.8 10*3/uL (ref 4.0–10.5)
nRBC: 0 % (ref 0.0–0.2)

## 2023-08-08 LAB — BASIC METABOLIC PANEL WITH GFR
Anion gap: 8 (ref 5–15)
BUN: 18 mg/dL (ref 8–23)
CO2: 30 mmol/L (ref 22–32)
Calcium: 8.5 mg/dL — ABNORMAL LOW (ref 8.9–10.3)
Chloride: 106 mmol/L (ref 98–111)
Creatinine, Ser: 1.16 mg/dL (ref 0.61–1.24)
GFR, Estimated: >60 mL/min (ref >60)
Glucose, Bld: 81 mg/dL (ref 70–99)
Potassium: 3.7 mmol/L (ref 3.5–5.1)
Sodium: 144 mmol/L (ref 135–145)

## 2023-08-08 LAB — MAGNESIUM: Magnesium: 1.9 mg/dL (ref 1.7–2.4)

## 2023-08-08 LAB — PHOSPHORUS: Phosphorus: 3.5 mg/dL (ref 2.5–4.6)

## 2023-08-08 MED ORDER — EZETIMIBE 10 MG PO TABS: 10.0000 mg | ORAL_TABLET | Freq: Every day | ORAL | Status: AC

## 2023-08-08 MED ORDER — CLOPIDOGREL BISULFATE 75 MG PO TABS: 75.0000 mg | ORAL_TABLET | Freq: Every day | ORAL | Status: AC

## 2023-08-08 MED ORDER — ASPIRIN 81 MG PO TBEC: 81.0000 mg | DELAYED_RELEASE_TABLET | Freq: Every day | ORAL | Status: DC

## 2023-08-08 NOTE — Progress Notes (Signed)
 Speech Language Pathology Treatment: Dysphagia  Patient Details Name: [name] MRN: [id number] DOB: 10-21-1945 Today's Date: 08/08/2023 Time: 9562-1308 SLP Time Calculation (min) (ACUTE ONLY): 17 min  Assessment / Plan / Recommendation Clinical Impression  Pt was seen for dysphagia tx focused on compensatory strategies. Pt was alert throughout session and demonstrated appropriate sustained attention to his meal. Pt was observed with honey-thick liquids and puree, displaying no overt signs of aspiration; from recent MBS eval (04/22), pt is a silent aspirator. SLP instructed pt on utilizing compensatory strategies to maximize swallow safety (chin-tuck, multiple swallows, liquid wash). Pt demonstrated use of strategies but was inconsistent with utilizing them during meal. Pt required maximal verbal cueing to remember compensatory strategies. SLP called pt's family to educate about current diet recommendation and aspiration precautions.   Recommend pt to continue practicing compensatory strategies (chin-tuck, multiple swallows, liquid wash) in hisnext level of care with an SLP. Having pt perform a throat clear and/or cough after each swallow is a beneficial strategy to maximize airway protection. Expiratory Muscle Strength Training is also highly recommended to increase cough strength. Pt needs full supervision for reinforcing compensatory strategies to optimize airway safety and swallow efficiency. Due to pt displaying decreased recall of new information during session, pt would benefit from a cognitive evaluation to potentially identify areas that may contribute to pt's adherence to swallow strategies.    HPI HPI: Pt is a 78 y.o. male who presented to ED on 04/20 due to left-sided weakness and trouble speaking. Pt was recently recovering from pneumonia and recent hip surgery. MRI displayed "patchy acute infarcts in the Right MCA territory, most  confluent along the right lateral motor strip." PMHx:  hypertension, hyperlipidemia, COPD, chronic hypoxic respiratory failure, AAA status post endovascular repair, CAD, HFmrEF, and left hip fracture status post hemiarthroplasty on 07/28/2023      SLP Plan  Continue with current plan of care      Recommendations for follow up therapy are one component of a multi-disciplinary discharge planning process, led by the attending physician.  Recommendations may be updated based on patient status, additional functional criteria and insurance authorization.    Recommendations  Diet recommendations: Dysphagia 1 (puree);Honey-thick liquid Liquids provided via: Cup Medication Administration: Whole meds with puree Supervision: Patient able to self feed;Full supervision/cueing for compensatory strategies Compensations: Minimize environmental distractions;Slow rate;Small sips/bites;Multiple dry swallows after each bite/sip;Follow solids with liquid;Chin tuck;Clear throat after each swallow Postural Changes and/or Swallow Maneuvers: Seated upright 90 degrees                  Oral care BID   Frequent or constant Supervision/Assistance Dysphagia, oropharyngeal phase (R13.12)     Continue with current plan of care     Aurelia Leeks  08/08/2023, 9:23 AM

## 2023-08-08 NOTE — Plan of Care (Signed)

## 2023-08-08 NOTE — Discharge Summary (Signed)
 [name] WJX:[id number] DOB: Aug 18, 1945 DOA: 08/05/2023  PCP: Sari Cunning, MD  Admit date: 08/05/2023  Discharge date: 08/09/2023  Admitted From: SNF   Disposition:  SNF   Recommendations for Outpatient Follow-up:   Follow up with PCP in 1-2 weeks  PCP Please obtain BMP/CBC, 2 view CXR in 1week,  (see Discharge instructions)   PCP Please follow up on the following pending results:     Home Health: None   Equipment/Devices: None  Consultations: Neuro Discharge Condition: Stable    CODE STATUS: Full    Diet Recommendation: Dysphagia 1 with honey thick liquids   Chief Complaint  Patient presents with   Code Stroke     Brief history of present illness from the day of admission and additional interim summary     78 y.o. male with medical history significant for hypertension, hyperlipidemia, COPD, chronic hypoxic respiratory failure on 4-6LNC, AAA status post endovascular repair, CAD, HFmrEF, and left hip fracture status post hemiarthroplasty on 07/28/2023 who now presents with left-sided weakness and speech difficulty.  Further workup suggestive of right MCA stroke and he was admitted to the hospital.                                                                  Hospital Course   Acute right MCA stroke -  CT findings suspicious for acute infarct involving posterior right frontal lobe MRI brain confirmed patchy acute infarcts in the Right MCA territory, most confluent along the right lateral motor strip. No hemorrhagic transformation or mass effect.  Echo shows EF of 30 to 35% with global hypokinesis, seen by stroke team, full stroke workup done.  Currently on aspirin  and Plavix  for 3 weeks then Plavix  alone, LDL was 76 already on highest dose statin hence Zetia  added, A1c 5.2.  Continue PT-OT-speech eval  and follow-up at SNF, currently symptoms much improved, still has mild dysphagia and minimal left upper extremity weakness, currently on dysphagia 1 diet with honey thickened liquids.   Neurology recommends 30-day monitor at discharge, cardiology has been informed, request SNF MD/PCP to also monitor and ensure that patient eventually gets a 30-day monitor within the next 5 to 6 days, postdischarge follow-up with [facility] neurology in 2 to 3 weeks.   Chronic HFmrEF   EF was 35% with global kinesis, blood pressure too low for beta-blocker, diuretics, ACE/ARB or Entresto .  Currently appears dehydrated will be hydrated with IV fluids gently on 08/07/2023.  Was discharged recommend follow-up with primary cardiologist in 2 to 3 weeks.  SNF to arrange.   Hypokalemia, hypophosphatemia  -replaced   Essential hypertension:   Permissive hypertension due to stroke, address blood pressure in a gradual manner from 08/10/2023.   CAD/hyperlipidemia  Denies chest pain.  On aspirin  and statin  Ongoing smoking.  Counseled to quit.     COPD; chronic hypoxic  respiratory failure - Not in exacerbation on admission, continue supportive care.   Discharge diagnosis     Principal Problem:   Cerebrovascular accident (CVA) (HCC) Active Problems:   COPD (chronic obstructive pulmonary disease) (HCC)   Essential hypertension   Chronic hypoxic respiratory failure (HCC)   History of stroke   Chronic systolic CHF (congestive heart failure) (HCC)   History of CAD (coronary artery disease)   Dysphagia   Acute ischemic stroke [facility])    Discharge instructions    Discharge Instructions     Discharge instructions   Complete by: As directed    Follow with Primary MD Sari Cunning, MD in 7 days   Get CBC, CMP, 2 view Chest X ray -  checked next visit with your primary MD   Activity: As tolerated with Full fall precautions use walker/cane & assistance as needed  Disposition SNF  Diet: Dysphagia 1 diet with honey  thick liquids with feeding assistance and aspiration precautions.  Special Instructions: If you have smoked or chewed Tobacco  in the last 2 yrs please stop smoking, stop any regular Alcohol  and or any Recreational drug use.  On your next visit with your primary care physician please Get Medicines reviewed and adjusted.  Please request your Prim.MD to go over all Hospital Tests and Procedure/Radiological results at the follow up, please get all Hospital records sent to your Prim MD by signing hospital release before you go home.  If you experience worsening of your admission symptoms, develop shortness of breath, life threatening emergency, suicidal or homicidal thoughts you must seek medical attention immediately by calling 911 or calling your MD immediately  if symptoms less severe.  You Must read complete instructions/literature along with all the possible adverse reactions/side effects for all the Medicines you take and that have been prescribed to you. Take any new Medicines after you have completely understood and accpet all the possible adverse reactions/side effects.   Do not drive when taking Pain medications.  Do not take more than prescribed Pain, Sleep and Anxiety Medications  Wear Seat belts while driving.   Increase activity slowly   Complete by: As directed        Discharge Medications   Allergies as of 08/09/2023       Reactions   Lyrica [pregabalin] Swelling           Medication List     TAKE these medications    acetaminophen  325 MG tablet Commonly known as: TYLENOL  Take 2 tablets (650 mg total) by mouth every 6 (six) hours as needed for mild pain (or Fever >/= 101).   albuterol  108 (90 Base) MCG/ACT inhaler Commonly known as: VENTOLIN  HFA Inhale 2 puffs into the lungs every 6 (six) hours as needed for wheezing.   albuterol  (2.5 MG/3ML) 0.083% nebulizer solution Commonly known as: PROVENTIL  Take 2.5 mg by nebulization every 6 (six) hours as needed.    aspirin  EC 81 MG tablet Take 1 tablet (81 mg total) by mouth daily.   atorvastatin  80 MG tablet Commonly known as: LIPITOR  Take 1 tablet (80 mg total) by mouth daily.   clopidogrel  75 MG tablet Commonly known as: PLAVIX  Take 1 tablet (75 mg total) by mouth daily.   dapagliflozin  propanediol 10 MG Tabs tablet Commonly known as: FARXIGA  Take 1 tablet (10 mg total) by mouth daily.   ezetimibe  10 MG tablet Commonly known as: ZETIA  Take  1 tablet (10 mg total) by mouth daily.   furosemide  20 MG tablet Commonly known as: LASIX  Take 20 mg by mouth See admin instructions. Take one tablet by mouth on Sunday, Tuesday, Wednesday, Thursday, and Saturday.   furosemide  40 MG tablet Commonly known as: LASIX  Take 40 mg by mouth 2 (two) times a week. Take one tablet my mouth on Monday and Friday.   ipratropium-albuterol  0.5-2.5 (3) MG/3ML Soln Commonly known as: DUONEB Inhale 3 mLs into the lungs every 4 (four) hours as needed (Shortness of breath or wheezing). What changed: Another medication with the same name was changed. Make sure you understand how and when to take each.   ipratropium-albuterol  0.5-2.5 (3) MG/3ML Soln Commonly known as: DUONEB Use 1 vial twice a day (scheduled) and every 4 hours as needed for shortness of breath and wheezing as directed (Use twice a day scheduled and every 4 hours as needed for shortness of breath and wheezing) What changed:  how much to take how to take this when to take this additional instructions   midodrine  5 MG tablet Commonly known as: PROAMATINE  Take 1 tablet (5 mg total) by mouth 2 (two) times daily with a meal.   multivitamin with minerals tablet Take 1 tablet by mouth daily.   omeprazole  40 MG capsule Commonly known as: PRILOSEC TAKE 1 CAPSULE BY MOUTH DAILY USUALLY BEFORE BREAKFAST   spironolactone  25 MG tablet Commonly known as: ALDACTONE  Take 0.5 tablets (12.5 mg total) by mouth daily. FOR CHRONIC COMBINED CHF    tiotropium 18 MCG inhalation capsule Commonly known as: Spiriva  HandiHaler Place 1 capsule (18 mcg total) into inhaler and inhale daily.         Contact information for follow-up providers     Sari Cunning, MD. Schedule an appointment as soon as possible for a visit in 1 week(s).   Specialty: Internal Medicine Contact information: [phone] [facility] [address] [facility] [place] [address] [phone]         [facility]. Schedule an appointment as soon as possible for a visit in 3 week(s).   Contact information: [address]     [address] [place]  [phone]             Contact information for after-discharge care     Destination     [facility] Preferred SNF .   Service: Skilled Nursing Contact information: [address] [place]  [phone]                     Major procedures and Radiology Reports - PLEASE review detailed and final reports thoroughly  -       DG Swallowing Func-Speech Pathology Result Date: 08/08/2023 CLINICAL DATA:  Dysphagia cough/GE reflux disease/other secondary diagnosis EXAM: MODIFIED BARIUM SWALLOW TECHNIQUE: Different consistencies of barium were administered orally to the patient by the Speech Pathologist. Imaging of the pharynx was performed in the lateral projection. FLUOROSCOPY TIME:  Radiation Exposure Index (as provided by the fluoroscopic device): 26.1 mGy Kerma COMPARISON:  None Available. FINDINGS: Modified barium swallow was performed by the speech pathologist. Radiologist was not involved with this exam. Please refer to the Speech Pathology report for results and recommendations. IMPRESSION: Please refer to the Speech Pathologists report for complete details and recommendations. Electronically Signed   By: Myrlene Asper D.O.   On: 08/08/2023 08:01   ECHOCARDIOGRAM  COMPLETE Result Date: 08/06/2023  ECHOCARDIOGRAM REPORT   Patient Name:   [name] Date of Exam: 08/06/2023 Medical Rec #:  [id number]      Height:       72.0 in Accession #:    [id number]     Weight:       179.5 lb Date of Birth:  [birth year]      BSA:          2.035 m Patient Age:    77 years       BP:           118/76 mmHg Patient Gender: M              HR:           59 bpm. Exam Location:  Inpatient Procedure: 2D Echo, Color Doppler, Cardiac Doppler and Intracardiac            Opacification Agent (Both Spectral and Color Flow Doppler were            utilized during procedure). Indications:    Stroke  History:        Patient has prior history of Echocardiogram examinations.                 Previous Myocardial Infarction, Stroke; Risk Factors:Current                 Smoker, Hypertension and HLD.  Sonographer:    Willey Harrier Referring Phys: [id number] TIMOTHY S OPYD  Sonographer Comments: Suboptimal parasternal window and Technically difficult study due to poor echo windows. IMPRESSIONS  1. Left ventricular ejection fraction, by estimation, is 30 to 35%. The left ventricle has moderately decreased function. The left ventricle demonstrates regional wall motion abnormalities with severe hypokinesis of the anterosepl, anterior, and inferior walls as well as the apex. No LV thrombus noted. Left ventricular diastolic parameters are consistent with Grade I diastolic dysfunction (impaired relaxation).  2. Right ventricular systolic function is mildly reduced. The right ventricular size is normal. There is normal pulmonary artery systolic pressure. The estimated right ventricular systolic pressure is 29.0 mmHg.  3. The mitral valve is normal in structure. Trivial mitral valve regurgitation. No evidence of mitral stenosis.  4. The aortic valve was not well visualized. Aortic valve regurgitation is trivial. No aortic stenosis is present.  5. The inferior vena cava is normal in size with greater than 50% respiratory  variability, suggesting right atrial pressure of 3 mmHg. FINDINGS  Left Ventricle: Left ventricular ejection fraction, by estimation, is 30 to 35%. The left ventricle has moderately decreased function. The left ventricle demonstrates regional wall motion abnormalities. Definity  contrast agent was given IV to delineate the left ventricular endocardial borders. The left ventricular internal cavity size was normal in size. There is no left ventricular hypertrophy. Left ventricular diastolic parameters are consistent with Grade I diastolic dysfunction (impaired relaxation). Right Ventricle: The right ventricular size is normal. No increase in right ventricular wall thickness. Right ventricular systolic function is mildly reduced. There is normal pulmonary artery systolic pressure. The tricuspid regurgitant velocity is 2.55 m/s, and with an assumed right atrial pressure of 3 mmHg, the estimated right ventricular systolic pressure is 29.0 mmHg. Left Atrium: Left atrial size was normal in size. Right Atrium: Right atrial size was normal in size. Pericardium: There is no evidence of pericardial effusion. Mitral Valve: The mitral valve is normal in structure. There is mild calcification of the mitral valve leaflet(s). Mild mitral annular calcification. Trivial mitral valve regurgitation. No evidence  of mitral valve stenosis. MV peak gradient, 5.3 mmHg. The mean mitral valve gradient is 2.0 mmHg. Tricuspid Valve: The tricuspid valve is normal in structure. Tricuspid valve regurgitation is trivial. Aortic Valve: The aortic valve was not well visualized. Aortic valve regurgitation is trivial. No aortic stenosis is present. Aortic valve peak gradient measures 7.5 mmHg. Pulmonic Valve: The pulmonic valve was normal in structure. Pulmonic valve regurgitation is not visualized. Aorta: The aortic root is normal in size and structure. Venous: The inferior vena cava is normal in size with greater than 50% respiratory variability,  suggesting right atrial pressure of 3 mmHg. IAS/Shunts: No atrial level shunt detected by color flow Doppler.  LEFT VENTRICLE PLAX 2D LVOT diam:     2.10 cm   Diastology LV SV:         66        LV e' medial:    5.00 cm/s LV SV Index:   32        LV E/e' medial:  10.4 LVOT Area:     3.46 cm  LV e' lateral:   11.20 cm/s                          LV E/e' lateral: 4.7  RIGHT VENTRICLE          IVC RV Basal diam:  4.30 cm  IVC diam: 1.30 cm LEFT ATRIUM             Index        RIGHT ATRIUM           Index LA Vol (A2C):   49.9 ml 24.53 ml/m  RA Area:     16.30 cm LA Vol (A4C):   50.4 ml 24.77 ml/m  RA Volume:   38.10 ml  18.73 ml/m LA Biplane Vol: 51.9 ml 25.51 ml/m  AORTIC VALVE AV Area (Vmax): 2.91 cm AV Vmax:        137.00 cm/s AV Peak Grad:   7.5 mmHg LVOT Vmax:      115.00 cm/s LVOT Vmean:     76.400 cm/s LVOT VTI:       0.190 m  AORTA Ao Root diam: 3.40 cm Ao Asc diam:  3.20 cm MITRAL VALVE                TRICUSPID VALVE MV Area (PHT): 2.10 cm     TR Peak grad:   26.0 mmHg MV Area VTI:   2.54 cm     TR Vmax:        255.00 cm/s MV Peak grad:  5.3 mmHg MV Mean grad:  2.0 mmHg     SHUNTS MV Vmax:       1.15 m/s     Systemic VTI:  0.19 m MV Vmean:      68.6 cm/s    Systemic Diam: 2.10 cm MV Decel Time: 361 msec MV E velocity: 52.10 cm/s MV A velocity: 110.00 cm/s MV E/A ratio:  0.47 Dalton McleanMD Electronically signed by Archer Bear Signature Date/Time: 08/06/2023/1:48:52 PM    Final    MR BRAIN WO CONTRAST Result Date: 08/06/2023 CLINICAL DATA:  78 year old male neurologic deficit. Code stroke presentation yesterday. Right perirolandic ischemia suspected on CT and CT P. EXAM: MRI HEAD WITHOUT CONTRAST TECHNIQUE: Multiplanar, multiecho pulse sequences of the brain and surrounding structures were obtained without intravenous contrast. COMPARISON:  Head CT, CTA, CTP yesterday. FINDINGS: Brain: Patchy, confluent 3 cm area of restricted  diffusion in the right lateral motor strip, posterior frontal operculum  tracking toward the right upper extremity representation area. Faint additional pre motor diffusion restriction, and somewhat separate 1.5 cm area of confluent restricted diffusion in the anterior right corona radiata near the caudate. These correspond to the earlier CT findings. There is heterogeneous T2 and FLAIR hyperintense cytotoxic edema. No hemorrhagic transformation. No mass effect. Superimposed chronic encephalomalacia throughout the lateral right temporal lobe, posterior and lateral right occipital lobe, inferior right parietal lobe. Small area of chronic right inferior frontal gyrus encephalomalacia. Moderate T2 and FLAIR heterogeneity throughout the bilateral deep gray nuclei appears to be post ischemic also and there is a chronic microhemorrhage in the left thalamus. Chronic small left cerebellar infarct with encephalomalacia. Tiny chronic right cerebellar infarct. No midline shift, mass effect, evidence of mass lesion, ventriculomegaly, extra-axial collection or acute intracranial hemorrhage. Cervicomedullary junction and pituitary are within normal limits. Vascular: Major intracranial vascular flow voids are preserved. Skull and upper cervical spine: Negative. Visualized bone marrow signal is within normal limits. Sinuses/Orbits: Rightward gaze.  Paranasal sinuses well aerated. Other: Mild bilateral mastoid effusions. Trace retained secretions in the nasopharynx. IMPRESSION: 1. Confirmed patchy acute infarcts in the Right MCA territory, most confluent along the right lateral motor strip. No hemorrhagic transformation or mass effect. 2. Underlying advanced chronic small and medium-sized vessel ischemic disease, possibly with some superimposed chronic posttraumatic encephalomalacia. Electronically Signed   By: Marlise Simpers M.D.   On: 08/06/2023 05:44   DG Chest Portable 1 View Result Date: 08/05/2023 CLINICAL DATA:  Cough code stroke EXAM: PORTABLE CHEST 1 VIEW COMPARISON:  07/28/2023 FINDINGS: Mildly  diminished lung volumes. No consolidation, pleural effusion or pneumothorax. Stable cardiomediastinal silhouette with aortic atherosclerosis. No pneumothorax IMPRESSION: No active disease. Mildly diminished lung volumes. Electronically Signed   By: Esmeralda Hedge M.D.   On: 08/05/2023 23:12   CT HEAD CODE STROKE WO CONTRAST Addendum Date: 08/05/2023 ADDENDUM REPORT: 08/05/2023 22:36 ADDENDUM: Findings discussed with Dr. Lindzen via telephone at 10:33 p.m. Electronically Signed   By: Stevenson Elbe M.D.   On: 08/05/2023 22:36   Result Date: 08/05/2023 CLINICAL DATA:  Code stroke.  Neuro deficit, acute, stroke suspected EXAM: CT HEAD WITHOUT CONTRAST TECHNIQUE: Contiguous axial images were obtained from the base of the skull through the vertex without intravenous contrast. RADIATION DOSE REDUCTION: This exam was performed according to the departmental dose-optimization program which includes automated exposure control, adjustment of the mA and/or kV according to patient size and/or use of iterative reconstruction technique. COMPARISON:  CT head April 12, 25. FINDINGS: Brain: Similar encephalomalacia in the right frontal, parietal, occipital and temporal lobes. Similar patchy white matter hypodensities. Similar remote left cerebellar infarct. New area of low attenuation and loss of gray white differentiation in the perirolandic posterior right frontal lobe, suspicious for acute infarct. No evidence of acute hemorrhage, midline shift, mass lesion or hydrocephalus. Vascular: No hyperdense vessel. Skull: No acute fracture. Sinuses/Orbits: Clear sinuses.  No acute orbital findings. ASPECTS (Alberta Stroke Program Early CT Score) - Ganglionic level infarction (caudate, lentiform nuclei, internal capsule, insula, M1-M3 cortex): 7 - Supraganglionic infarction (M4-M6 cortex): 1-2 Total score (0-10 with 10 being normal): 8-9 Dr. Lindzen has been paged at the time of dictation for call of report. IMPRESSION: 1. Findings  suspicious for acute infarct in the perirolandic posterior right frontal lobe, suspicious for acute infarct. ASPECTS is 8-9. 2. No acute hemorrhage. 3. Similar extensive remote infarcts. Electronically Signed: By: Stevenson Elbe M.D. On: 08/05/2023  22:05   CT ANGIO HEAD NECK W WO CM W PERF (CODE STROKE) Result Date: 08/05/2023 CLINICAL DATA:  Neuro deficit, acute, stroke suspected EXAM: CT ANGIOGRAPHY HEAD AND NECK CT PERFUSION BRAIN TECHNIQUE: Multidetector CT imaging of the head and neck was performed using the standard protocol during bolus administration of intravenous contrast. Multiplanar CT image reconstructions and MIPs were obtained to evaluate the vascular anatomy. Carotid stenosis measurements (when applicable) are obtained utilizing NASCET criteria, using the distal internal carotid diameter as the denominator. Multiphase CT imaging of the brain was performed following IV bolus contrast injection. Subsequent parametric perfusion maps were calculated using RAPID software. RADIATION DOSE REDUCTION: This exam was performed according to the departmental dose-optimization program which includes automated exposure control, adjustment of the mA and/or kV according to patient size and/or use of iterative reconstruction technique. CONTRAST:  OMNIPAQUE  IOHEXOL  350 MG/ML SOLN COMPARISON:  Same day CT head. FINDINGS: CTA NECK FINDINGS Aortic arch: Aortic atherosclerosis. Great vessel origins are incompletely imaged. Visualized great vessel origins are patent. Moderate left subclavian artery stenosis. Right carotid system: Atherosclerosis at the carotid bifurcation involving the proximal ICA with approximately 30% stenosis relative to the distal vessel. Left carotid system: Common carotid artery origin is not imaged. Atherosclerosis at the carotid bifurcation without greater than 50% stenosis. Vertebral arteries: Severe left vertebral artery origin stenosis. Mildly diminished opacification of the more  distal vertebral artery. Right vertebral arteries patent without greater than 50% stenosis. Skeleton: No acute abnormality on limited assessment. Other neck: No acute abnormality on limited assessment. Upper chest: Visualized lung apices are clear. Review of the MIP images confirms the above findings CTA HEAD FINDINGS Anterior circulation: Hypoplastic right A1 ACA, likely congenital. Otherwise, bilateral the intracranial ICAs, MCAs, and ACAs are patent without proximal high-grade stenosis. Posterior circulation: Diminished opacification of the left intradural vertebral artery. Moderate left and mild right intradural vertebral artery stenosis due to atherosclerosis. The basilar artery and bilateral posterior cerebral arteries are patent. Severe left P2 PCA stenosis. Venous sinuses: Not well assessed arterial timing. Review of the MIP images confirms the above findings CT Brain Perfusion Findings: CBF (<30%) Volume: 0mL Perfusion (Tmax>6.0s) volume: 4mL Mismatch Volume: 4mL Infarction Location:No core infarct identified. IMPRESSION: CTA: 1. No emergent large vessel occlusion. 2. Severe left P2 PCA stenosis. 3. Severe left vertebral artery origin stenosis with diminished opacification of the more distal vertebral artery. 4. Moderate left intradural vertebral artery stenosis. 5. Moderate left subclavian artery stenosis. 6. Aortic Atherosclerosis (ICD10-I70.0). CT perfusion: 1. RAPID reports approximately 4 mL of mismatch/penumbra in the posterior right frontal area, which correlates with findings on CT head. 2. No evidence of core infarct by CT perfusion. MRI could further evaluate. Findings discussed with Dr. Lindzen via telephone at 10:33 p.m. Electronically Signed   By: Stevenson Elbe M.D.   On: 08/05/2023 22:36   DG HIP UNILAT WITH PELVIS 1V LEFT Result Date: 07/28/2023 CLINICAL DATA:  [id number] Postop check [id number]; [id number] Surgery, elective [id number] EXAM: PORTABLE PELVIS 1-2 VIEWS; DG HIP (WITH OR WITHOUT PELVIS)  1V*L* COMPARISON:  X-ray left hip 07/28/2023 2:53 a.m. FINDINGS: Interval total left hip arthroplasty. No radiographic findings to suggest surgical hardware complication. No acute displaced fracture or dislocation of the right hip on frontal view. There is no evidence of pelvic fracture or diastasis. No pelvic bone lesions are seen. Overlying left hip subcutaneus soft tissue edema and emphysema consistent with postsurgical changes. Vascular calcifications. Radiation seeds overlie the pelvis. Bilateral iliac stent graft partially visualized. Lumbar surgical  hardware partially visualized. IMPRESSION: Interval total left hip arthroplasty. Electronically Signed   By: Morgane  Naveau M.D.   On: 07/28/2023 18:29   DG Pelvis Portable Result Date: 07/28/2023 CLINICAL DATA:  [id number] Postop check [id number]; [id number] Surgery, elective [id number] EXAM: PORTABLE PELVIS 1-2 VIEWS; DG HIP (WITH OR WITHOUT PELVIS) 1V*L* COMPARISON:  X-ray left hip 07/28/2023 2:53 a.m. FINDINGS: Interval total left hip arthroplasty. No radiographic findings to suggest surgical hardware complication. No acute displaced fracture or dislocation of the right hip on frontal view. There is no evidence of pelvic fracture or diastasis. No pelvic bone lesions are seen. Overlying left hip subcutaneus soft tissue edema and emphysema consistent with postsurgical changes. Vascular calcifications. Radiation seeds overlie the pelvis. Bilateral iliac stent graft partially visualized. Lumbar surgical hardware partially visualized. IMPRESSION: Interval total left hip arthroplasty. Electronically Signed   By: Morgane  Naveau M.D.   On: 07/28/2023 18:29   CT HEAD WO CONTRAST ( ) Result Date: 07/28/2023 CLINICAL DATA:  Fall, poly trauma EXAM: CT HEAD WITHOUT CONTRAST CT CERVICAL SPINE WITHOUT CONTRAST TECHNIQUE: Multidetector CT imaging of the head and cervical spine was performed following the standard protocol without intravenous contrast. Multiplanar CT image  reconstructions of the cervical spine were also generated. RADIATION DOSE REDUCTION: This exam was performed according to the departmental dose-optimization program which includes automated exposure control, adjustment of the mA and/or kV according to patient size and/or use of iterative reconstruction technique. COMPARISON:  07/19/2023 FINDINGS: CT HEAD FINDINGS Brain: No evidence of acute infarction, hemorrhage, hydrocephalus, extra-axial collection or mass lesion/mass effect. Chronic areas of cortically based encephalomalacia in the right frontal, parietal, occipital, and temporal cortex on the right with patchy chronic infarction in the right basal ganglia. Small calcifications along the right cerebral convexity which may be from prior calcified embolic disease. Vascular: No hyperdense vessel or unexpected calcification. Skull: Normal. Negative for fracture or focal lesion. Sinuses/Orbits: No acute finding. CT CERVICAL SPINE FINDINGS Alignment: No traumatic malalignment. Levels of mild degenerative anterolisthesis. Skull base and vertebrae: No acute fracture. No primary bone lesion or focal pathologic process. Soft tissues and spinal canal: No prevertebral fluid or swelling. No visible canal hematoma. Disc levels: Degenerative spurring asymmetrically affecting left-sided facets. Upper chest: Mild centrilobular emphysema. IMPRESSION: No evidence of acute intracranial or cervical spine injury. Electronically Signed   By: Ronnette Coke M.D.   On: 07/28/2023 04:08   CT Cervical Spine Wo Contrast Result Date: 07/28/2023 CLINICAL DATA:  Fall, poly trauma EXAM: CT HEAD WITHOUT CONTRAST CT CERVICAL SPINE WITHOUT CONTRAST TECHNIQUE: Multidetector CT imaging of the head and cervical spine was performed following the standard protocol without intravenous contrast. Multiplanar CT image reconstructions of the cervical spine were also generated. RADIATION DOSE REDUCTION: This exam was performed according to the  departmental dose-optimization program which includes automated exposure control, adjustment of the mA and/or kV according to patient size and/or use of iterative reconstruction technique. COMPARISON:  07/19/2023 FINDINGS: CT HEAD FINDINGS Brain: No evidence of acute infarction, hemorrhage, hydrocephalus, extra-axial collection or mass lesion/mass effect. Chronic areas of cortically based encephalomalacia in the right frontal, parietal, occipital, and temporal cortex on the right with patchy chronic infarction in the right basal ganglia. Small calcifications along the right cerebral convexity which may be from prior calcified embolic disease. Vascular: No hyperdense vessel or unexpected calcification. Skull: Normal. Negative for fracture or focal lesion. Sinuses/Orbits: No acute finding. CT CERVICAL SPINE FINDINGS Alignment: No traumatic malalignment. Levels of mild degenerative anterolisthesis. Skull base and vertebrae: No acute fracture.  No primary bone lesion or focal pathologic process. Soft tissues and spinal canal: No prevertebral fluid or swelling. No visible canal hematoma. Disc levels: Degenerative spurring asymmetrically affecting left-sided facets. Upper chest: Mild centrilobular emphysema. IMPRESSION: No evidence of acute intracranial or cervical spine injury. Electronically Signed   By: Ronnette Coke M.D.   On: 07/28/2023 04:08   DG HIP UNILAT WITH PELVIS 2-3 VIEWS LEFT Result Date: 07/28/2023 CLINICAL DATA:  Fall, pain EXAM: DG HIP (WITH OR WITHOUT PELVIS) 2-3V LEFT COMPARISON:  Pelvis today FINDINGS: There is a left femoral neck fracture with varus angulation and impaction. No subluxation or dislocation. Radiation seeds in the region the prostate. IMPRESSION: Left femoral neck fracture with varus angulation and impaction. Electronically Signed   By: Janeece Mechanic M.D.   On: 07/28/2023 03:00   DG Chest Port 1 View Result Date: 07/28/2023 CLINICAL DATA:  Fall EXAM: PORTABLE CHEST 1 VIEW  COMPARISON:  None Available. FINDINGS: Heart is mildly enlarged. Lungs clear. No effusions or edema. No pneumothorax. No acute bony abnormality. IMPRESSION: Mild cardiomegaly.  No active disease. Electronically Signed   By: Janeece Mechanic M.D.   On: 07/28/2023 02:59   DG Pelvis Portable Result Date: 07/28/2023 CLINICAL DATA:  Fall.  Left hip pain EXAM: PORTABLE PELVIS 1-2 VIEWS COMPARISON:  03/22/2021 FINDINGS: There is a left femoral neck fracture with impaction and varus angulation. No subluxation or dislocation. Postoperative changes in the lower lumbar spine. SI joints symmetric and unremarkable. IMPRESSION: Left femoral neck fracture with impaction and varus angulation. Electronically Signed   By: Janeece Mechanic M.D.   On: 07/28/2023 02:59   CT Head Wo Contrast Result Date: 07/19/2023 CLINICAL DATA:  Provided history: Head trauma, minor.  Neck trauma. EXAM: CT HEAD WITHOUT CONTRAST CT CERVICAL SPINE WITHOUT CONTRAST TECHNIQUE: Multidetector CT imaging of the head and cervical spine was performed following the standard protocol without intravenous contrast. Multiplanar CT image reconstructions of the cervical spine were also generated. RADIATION DOSE REDUCTION: This exam was performed according to the departmental dose-optimization program which includes automated exposure control, adjustment of the mA and/or kV according to patient size and/or use of iterative reconstruction technique. COMPARISON:  Head CT 03/22/2021.  Cervical spine CT 02/21/2021. FINDINGS: CT HEAD FINDINGS Brain: Generalized cerebral atrophy. Moderate-sized chronic infarct within the right parietal and occipital lobes (PCA vascular territory), new from the prior head CT of 03/22/2021. This infarct is at least partly chronic. However, portions of the infarct within the right occipital lobe may be subacute or chronic. Small chronic infarct within the posterior right frontal lobe (affecting the precentral gyrus), new from the prior CT). Sizable  focus of chronic encephalomalacia/gliosis within the right temporal lobe (at site of prior parenchymal hemorrhage). Small focus of post-traumatic encephalomalacia/gliosis within the anteroinferior right frontal lobe. Chronic infarcts again demonstrated within/about the bilateral deep gray nuclei. Mild patchy and ill-defined hypoattenuation elsewhere with the cerebral white matter, nonspecific but compatible with chronic small vessel ischemic disease. Small chronic infarct within the left cerebellar hemisphere, unchanged. There is no acute intracranial hemorrhage. No acute demarcated cortical infarct. No extra-axial fluid collection. No evidence of an intracranial mass. No midline shift. Vascular: No hyperdense vessel.  Atherosclerotic calcifications. Skull: No calvarial fracture or aggressive osseous lesion. Sinuses/Orbits: No mass or acute finding within the imaged orbits. Mild mucosal thickening within the left maxillary and bilateral ethmoid sinuses. Other: Small-volume fluid within the mastoid air cells. CT CERVICAL SPINE FINDINGS Alignment: Dextrocurvature of the cervical spine. Mild grade 1 anterolisthesis at  C5-C6 and C6-C7. Skull base and vertebrae: The basion-dental and atlanto-dental intervals are maintained.No evidence of acute fracture to the cervical spine. Soft tissues and spinal canal: No prevertebral fluid or swelling. No visible canal hematoma. Disc levels: Cervical spondylosis with multilevel disc bulges/disc protrusions, uncovertebral hypertrophy and facet arthropathy. No appreciable high-grade spinal canal stenosis. Multilevel bony neural foraminal narrowing. Degenerative changes also present at the C1-C2 articulation. Upper chest: The right lung apex is excluded from the field of view. No consolidation within the imaged left lung apex. No visible pneumothorax. IMPRESSION: CT head: 1. No acute intracranial hemorrhage. 2. Moderate-sized chronic infarct within the right parietal and occipital lobes  (PCA vascular territory), new from the prior head CT of 03/22/2021. This infarct is at least partly chronic. However, portions of the infarct within the right occipital lobe may be subacute or chronic. A brain MRI may be obtained for further evaluation, as clinically warranted. 3. Sizable focus of chronic encephalomalacia/gliosis within the right temporal lobe (at site of prior parenchymal hemorrhage). 4. Small focus of post-traumatic encephalomalacia/gliosis within the anteroinferior right frontal lobe. 5. Background parenchymal atrophy, chronic small vessel ischemic disease and chronic infarcts, as described. 6. Mild paranasal sinus mucosal thickening. 7. Small-volume fluid within the mastoid air cells. CT cervical spine: 1. No evidence of an acute cervical spine fracture. 2. Dextrocurvature of the cervical spine. 3. Mild grade 1 anterolisthesis at C5-C6 and C6-C7, unchanged from the prior cervical spine CT of 02/21/2021. 4. Cervical spondylosis as described. Electronically Signed   By: Bascom Lily D.O.   On: 07/19/2023 08:58   CT Cervical Spine Wo Contrast Result Date: 07/19/2023 CLINICAL DATA:  Provided history: Head trauma, minor.  Neck trauma. EXAM: CT HEAD WITHOUT CONTRAST CT CERVICAL SPINE WITHOUT CONTRAST TECHNIQUE: Multidetector CT imaging of the head and cervical spine was performed following the standard protocol without intravenous contrast. Multiplanar CT image reconstructions of the cervical spine were also generated. RADIATION DOSE REDUCTION: This exam was performed according to the departmental dose-optimization program which includes automated exposure control, adjustment of the mA and/or kV according to patient size and/or use of iterative reconstruction technique. COMPARISON:  Head CT 03/22/2021.  Cervical spine CT 02/21/2021. FINDINGS: CT HEAD FINDINGS Brain: Generalized cerebral atrophy. Moderate-sized chronic infarct within the right parietal and occipital lobes (PCA vascular territory),  new from the prior head CT of 03/22/2021. This infarct is at least partly chronic. However, portions of the infarct within the right occipital lobe may be subacute or chronic. Small chronic infarct within the posterior right frontal lobe (affecting the precentral gyrus), new from the prior CT). Sizable focus of chronic encephalomalacia/gliosis within the right temporal lobe (at site of prior parenchymal hemorrhage). Small focus of post-traumatic encephalomalacia/gliosis within the anteroinferior right frontal lobe. Chronic infarcts again demonstrated within/about the bilateral deep gray nuclei. Mild patchy and ill-defined hypoattenuation elsewhere with the cerebral white matter, nonspecific but compatible with chronic small vessel ischemic disease. Small chronic infarct within the left cerebellar hemisphere, unchanged. There is no acute intracranial hemorrhage. No acute demarcated cortical infarct. No extra-axial fluid collection. No evidence of an intracranial mass. No midline shift. Vascular: No hyperdense vessel.  Atherosclerotic calcifications. Skull: No calvarial fracture or aggressive osseous lesion. Sinuses/Orbits: No mass or acute finding within the imaged orbits. Mild mucosal thickening within the left maxillary and bilateral ethmoid sinuses. Other: Small-volume fluid within the mastoid air cells. CT CERVICAL SPINE FINDINGS Alignment: Dextrocurvature of the cervical spine. Mild grade 1 anterolisthesis at C5-C6 and C6-C7. Skull base and  vertebrae: The basion-dental and atlanto-dental intervals are maintained.No evidence of acute fracture to the cervical spine. Soft tissues and spinal canal: No prevertebral fluid or swelling. No visible canal hematoma. Disc levels: Cervical spondylosis with multilevel disc bulges/disc protrusions, uncovertebral hypertrophy and facet arthropathy. No appreciable high-grade spinal canal stenosis. Multilevel bony neural foraminal narrowing. Degenerative changes also present at  the C1-C2 articulation. Upper chest: The right lung apex is excluded from the field of view. No consolidation within the imaged left lung apex. No visible pneumothorax. IMPRESSION: CT head: 1. No acute intracranial hemorrhage. 2. Moderate-sized chronic infarct within the right parietal and occipital lobes (PCA vascular territory), new from the prior head CT of 03/22/2021. This infarct is at least partly chronic. However, portions of the infarct within the right occipital lobe may be subacute or chronic. A brain MRI may be obtained for further evaluation, as clinically warranted. 3. Sizable focus of chronic encephalomalacia/gliosis within the right temporal lobe (at site of prior parenchymal hemorrhage). 4. Small focus of post-traumatic encephalomalacia/gliosis within the anteroinferior right frontal lobe. 5. Background parenchymal atrophy, chronic small vessel ischemic disease and chronic infarcts, as described. 6. Mild paranasal sinus mucosal thickening. 7. Small-volume fluid within the mastoid air cells. CT cervical spine: 1. No evidence of an acute cervical spine fracture. 2. Dextrocurvature of the cervical spine. 3. Mild grade 1 anterolisthesis at C5-C6 and C6-C7, unchanged from the prior cervical spine CT of 02/21/2021. 4. Cervical spondylosis as described. Electronically Signed   By: Bascom Lily D.O.   On: 07/19/2023 08:58    Micro Results     No results found for this or any previous visit (from the past 240 hours).  Today   Subjective    [name] today has no headache,no chest abdominal pain,no new weakness tingling or numbness, feels much better wants to go home today.     Objective   Blood pressure (!) 143/88, pulse 60, temperature (!) 97.5 F (36.4 C), temperature source Axillary, resp. rate (!) 21, weight 74.4 kg, SpO2 96%.   Intake/Output Summary (Last 24 hours) at 08/09/2023 1032 Last data filed at 08/08/2023 1744 Gross per 24 hour  Intake 120 ml  Output --  Net 120 ml     Exam  Awake Alert, No new F.N deficits, minimal left upper extremity weakness [place].AT,PERRAL Supple Neck,   Symmetrical Chest wall movement, Good air movement bilaterally, CTAB RRR,No Gallops,   +ve B.Sounds, Abd Soft, Non tender,  No Cyanosis, Clubbing or edema    Data Review   Recent Labs  Lab 08/05/23 2141 08/05/23 2143 08/05/23 2154 08/06/23 0320 08/07/23 0427 08/08/23 0433  WBC 9.4  --   --  8.1 8.4 6.8  HGB 12.8* 13.3 12.9* 12.2* 12.2* 11.7*  HCT 39.9 39.0 38.0* 38.4* 38.0* 37.0*  PLT 310  --   --  280 262 274  MCV 96.4  --   --  97.7 96.2 97.6  MCH 30.9  --   --  31.0 30.9 30.9  MCHC 32.1  --   --  31.8 32.1 31.6  RDW 13.4  --   --  13.4 13.5 13.4  LYMPHSABS 1.2  --   --   --   --  1.1  MONOABS 0.8  --   --   --   --  0.7  EOSABS 0.4  --   --   --   --  0.3  BASOSABS 0.1  --   --   --   --  0.0    Recent Labs  Lab 08/05/23 2141 08/05/23 2143 08/05/23 2154 08/06/23 0320 08/08/23 0433  NA 140 141 142  --  144  K 3.8 3.9 3.8  --  3.7  CL 105 105  --   --  106  CO2 26  --   --   --  30  ANIONGAP 9  --   --   --  8  GLUCOSE 139* 133*  --   --  81  BUN 25* 28*  --   --  18  CREATININE 1.33* 1.50*  --   --  1.16  AST 45*  --   --   --   --   ALT 28  --   --   --   --   ALKPHOS 89  --   --   --   --   BILITOT 1.2  --   --   --   --   ALBUMIN 2.6*  --   --   --   --   INR 1.1  --   --   --   --   HGBA1C  --   --   --  5.2  --   MG  --   --   --   --  1.9  PHOS  --   --   --   --  3.5  CALCIUM  8.7*  --   --   --  8.5*    Total Time in preparing paper work, data evaluation and todays exam - 35 minutes  Signature  -    Lynnwood Sauer M.D on 08/09/2023 at 10:32 AM   -  To page go to [email or web address]

## 2023-08-08 NOTE — Care Management Important Message (Signed)
 Important Message  Patient Details  Name: [name] MRN: [id number] Date of Birth: 11-Feb-1946   Important Message Given:  Yes - Medicare IM     Wynonia Hedges 08/08/2023, 4:06 PM

## 2023-08-08 NOTE — Discharge Instructions (Signed)
 Follow with Primary MD Sari Cunning, MD in 7 days   Get CBC, CMP, 2 view Chest X ray -  checked next visit with your primary MD   Activity: As tolerated with Full fall precautions use walker/cane & assistance as needed  Disposition SNF  Diet: Dysphagia 1 diet with honey thick liquids with feeding assistance and aspiration precautions.  Special Instructions: If you have smoked or chewed Tobacco  in the last 2 yrs please stop smoking, stop any regular Alcohol  and or any Recreational drug use.  On your next visit with your primary care physician please Get Medicines reviewed and adjusted.  Please request your Prim.MD to go over all Hospital Tests and Procedure/Radiological results at the follow up, please get all Hospital records sent to your Prim MD by signing hospital release before you go home.  If you experience worsening of your admission symptoms, develop shortness of breath, life threatening emergency, suicidal or homicidal thoughts you must seek medical attention immediately by calling 911 or calling your MD immediately  if symptoms less severe.  You Must read complete instructions/literature along with all the possible adverse reactions/side effects for all the Medicines you take and that have been prescribed to you. Take any new Medicines after you have completely understood and accpet all the possible adverse reactions/side effects.   Do not drive when taking Pain medications.  Do not take more than prescribed Pain, Sleep and Anxiety Medications  Wear Seat belts while driving.

## 2023-08-08 NOTE — TOC Progression Note (Addendum)
 Transition of Care [facility]) - Progression Note    Patient Details  Name: [name] MRN: [id number] Date of Birth: 25-Oct-1945  Transition of Care [facility]) CM/SW Contact  Jannice Mends, LCSW Phone Number: 08/08/2023, 9:05 AM  Clinical Narrative:    9:05 AM-Awaiting insurance approval for [facility].   3:23 PM-Still awaiting insurance approval.   Expected Discharge Plan: Skilled Nursing Facility Barriers to Discharge: Continued Medical Work up, English as a second language teacher  Expected Discharge Plan and Services In-house Referral: Clinical Social Work   Post Acute Care Choice: Skilled Nursing Facility Living arrangements for the past 2 months: Single Family Home Expected Discharge Date: 08/08/23                                     Social Determinants of Health (SDOH) Interventions SDOH Screenings   Food Insecurity: No Food Insecurity (07/28/2023)  Housing: Low Risk  (07/28/2023)  Transportation Needs: No Transportation Needs (07/28/2023)  Utilities: Not At Risk (07/28/2023)  Depression (PHQ2-9): Low Risk  (09/03/2018)  Financial Resource Strain: Low Risk  (01/03/2023)   Received from [facility]  Social Connections: Moderately Isolated (07/28/2023)  Stress: No Stress Concern Present (08/21/2018)  Tobacco Use: High Risk (08/05/2023)    Readmission Risk Interventions    04/28/2021    9:32 AM  Readmission Risk Prevention Plan  Transportation Screening Complete  PCP or Specialist Appt within 3-5 Days Complete  HRI or Home Care Consult Complete  Social Work Consult for Recovery Care Planning/Counseling Complete  Palliative Care Screening Not Applicable  Medication Review Oceanographer) Complete

## 2023-08-09 DIAGNOSIS — Z7982 Long term (current) use of aspirin: Secondary | ICD-10-CM | POA: Diagnosis not present

## 2023-08-09 DIAGNOSIS — I63411 Cerebral infarction due to embolism of right middle cerebral artery: Secondary | ICD-10-CM | POA: Diagnosis not present

## 2023-08-09 DIAGNOSIS — J441 Chronic obstructive pulmonary disease with (acute) exacerbation: Secondary | ICD-10-CM | POA: Diagnosis not present

## 2023-08-09 DIAGNOSIS — S73102A Unspecified sprain of left hip, initial encounter: Secondary | ICD-10-CM | POA: Diagnosis not present

## 2023-08-09 DIAGNOSIS — I2510 Atherosclerotic heart disease of native coronary artery without angina pectoris: Secondary | ICD-10-CM | POA: Diagnosis not present

## 2023-08-09 DIAGNOSIS — W19XXXA Unspecified fall, initial encounter: Secondary | ICD-10-CM | POA: Diagnosis not present

## 2023-08-09 DIAGNOSIS — R002 Palpitations: Secondary | ICD-10-CM | POA: Diagnosis not present

## 2023-08-09 DIAGNOSIS — R531 Weakness: Secondary | ICD-10-CM | POA: Diagnosis not present

## 2023-08-09 DIAGNOSIS — I639 Cerebral infarction, unspecified: Secondary | ICD-10-CM | POA: Diagnosis not present

## 2023-08-09 DIAGNOSIS — Z8673 Personal history of transient ischemic attack (TIA), and cerebral infarction without residual deficits: Secondary | ICD-10-CM | POA: Diagnosis not present

## 2023-08-09 DIAGNOSIS — F17210 Nicotine dependence, cigarettes, uncomplicated: Secondary | ICD-10-CM | POA: Diagnosis not present

## 2023-08-09 DIAGNOSIS — Z743 Need for continuous supervision: Secondary | ICD-10-CM | POA: Diagnosis not present

## 2023-08-09 DIAGNOSIS — J8410 Pulmonary fibrosis, unspecified: Secondary | ICD-10-CM | POA: Diagnosis not present

## 2023-08-09 DIAGNOSIS — S72002A Fracture of unspecified part of neck of left femur, initial encounter for closed fracture: Secondary | ICD-10-CM | POA: Diagnosis not present

## 2023-08-09 DIAGNOSIS — W050XXA Fall from non-moving wheelchair, initial encounter: Secondary | ICD-10-CM | POA: Diagnosis not present

## 2023-08-09 DIAGNOSIS — I69322 Dysarthria following cerebral infarction: Secondary | ICD-10-CM | POA: Diagnosis not present

## 2023-08-09 DIAGNOSIS — R2689 Other abnormalities of gait and mobility: Secondary | ICD-10-CM | POA: Diagnosis not present

## 2023-08-09 DIAGNOSIS — Z9981 Dependence on supplemental oxygen: Secondary | ICD-10-CM | POA: Diagnosis not present

## 2023-08-09 DIAGNOSIS — Z7401 Bed confinement status: Secondary | ICD-10-CM | POA: Diagnosis not present

## 2023-08-09 DIAGNOSIS — Z96642 Presence of left artificial hip joint: Secondary | ICD-10-CM | POA: Diagnosis not present

## 2023-08-09 DIAGNOSIS — Z5189 Encounter for other specified aftercare: Secondary | ICD-10-CM | POA: Diagnosis not present

## 2023-08-09 DIAGNOSIS — I5020 Unspecified systolic (congestive) heart failure: Secondary | ICD-10-CM | POA: Diagnosis not present

## 2023-08-09 DIAGNOSIS — I69354 Hemiplegia and hemiparesis following cerebral infarction affecting left non-dominant side: Secondary | ICD-10-CM | POA: Diagnosis not present

## 2023-08-09 DIAGNOSIS — L84 Corns and callosities: Secondary | ICD-10-CM | POA: Diagnosis not present

## 2023-08-09 DIAGNOSIS — R0902 Hypoxemia: Secondary | ICD-10-CM | POA: Diagnosis not present

## 2023-08-09 DIAGNOSIS — R0602 Shortness of breath: Secondary | ICD-10-CM | POA: Diagnosis not present

## 2023-08-09 DIAGNOSIS — K5900 Constipation, unspecified: Secondary | ICD-10-CM | POA: Diagnosis not present

## 2023-08-09 DIAGNOSIS — G4489 Other headache syndrome: Secondary | ICD-10-CM | POA: Diagnosis not present

## 2023-08-09 DIAGNOSIS — L602 Onychogryphosis: Secondary | ICD-10-CM | POA: Diagnosis not present

## 2023-08-09 DIAGNOSIS — R109 Unspecified abdominal pain: Secondary | ICD-10-CM | POA: Diagnosis present

## 2023-08-09 DIAGNOSIS — I10 Essential (primary) hypertension: Secondary | ICD-10-CM | POA: Diagnosis not present

## 2023-08-09 DIAGNOSIS — I6930 Unspecified sequelae of cerebral infarction: Secondary | ICD-10-CM | POA: Diagnosis not present

## 2023-08-09 DIAGNOSIS — J40 Bronchitis, not specified as acute or chronic: Secondary | ICD-10-CM | POA: Diagnosis not present

## 2023-08-09 DIAGNOSIS — R278 Other lack of coordination: Secondary | ICD-10-CM | POA: Diagnosis not present

## 2023-08-09 DIAGNOSIS — I739 Peripheral vascular disease, unspecified: Secondary | ICD-10-CM | POA: Diagnosis not present

## 2023-08-09 DIAGNOSIS — J449 Chronic obstructive pulmonary disease, unspecified: Secondary | ICD-10-CM | POA: Diagnosis not present

## 2023-08-09 DIAGNOSIS — Z79899 Other long term (current) drug therapy: Secondary | ICD-10-CM | POA: Diagnosis not present

## 2023-08-09 DIAGNOSIS — I6523 Occlusion and stenosis of bilateral carotid arteries: Secondary | ICD-10-CM | POA: Diagnosis not present

## 2023-08-09 DIAGNOSIS — Z8679 Personal history of other diseases of the circulatory system: Secondary | ICD-10-CM | POA: Diagnosis not present

## 2023-08-09 DIAGNOSIS — N200 Calculus of kidney: Secondary | ICD-10-CM | POA: Diagnosis not present

## 2023-08-09 DIAGNOSIS — R404 Transient alteration of awareness: Secondary | ICD-10-CM | POA: Diagnosis not present

## 2023-08-09 DIAGNOSIS — M6281 Muscle weakness (generalized): Secondary | ICD-10-CM | POA: Diagnosis not present

## 2023-08-09 DIAGNOSIS — R918 Other nonspecific abnormal finding of lung field: Secondary | ICD-10-CM | POA: Diagnosis not present

## 2023-08-09 DIAGNOSIS — D62 Acute posthemorrhagic anemia: Secondary | ICD-10-CM | POA: Diagnosis not present

## 2023-08-09 DIAGNOSIS — Z8546 Personal history of malignant neoplasm of prostate: Secondary | ICD-10-CM | POA: Diagnosis not present

## 2023-08-09 DIAGNOSIS — R4182 Altered mental status, unspecified: Secondary | ICD-10-CM | POA: Diagnosis not present

## 2023-08-09 DIAGNOSIS — R296 Repeated falls: Secondary | ICD-10-CM | POA: Diagnosis not present

## 2023-08-09 DIAGNOSIS — R1084 Generalized abdominal pain: Secondary | ICD-10-CM | POA: Diagnosis not present

## 2023-08-09 DIAGNOSIS — R1312 Dysphagia, oropharyngeal phase: Secondary | ICD-10-CM | POA: Diagnosis not present

## 2023-08-09 DIAGNOSIS — J9611 Chronic respiratory failure with hypoxia: Secondary | ICD-10-CM | POA: Diagnosis not present

## 2023-08-09 DIAGNOSIS — I509 Heart failure, unspecified: Secondary | ICD-10-CM | POA: Diagnosis not present

## 2023-08-09 DIAGNOSIS — R41841 Cognitive communication deficit: Secondary | ICD-10-CM | POA: Diagnosis not present

## 2023-08-09 DIAGNOSIS — I110 Hypertensive heart disease with heart failure: Secondary | ICD-10-CM | POA: Diagnosis not present

## 2023-08-09 DIAGNOSIS — Z043 Encounter for examination and observation following other accident: Secondary | ICD-10-CM | POA: Diagnosis not present

## 2023-08-09 DIAGNOSIS — Z7902 Long term (current) use of antithrombotics/antiplatelets: Secondary | ICD-10-CM | POA: Diagnosis not present

## 2023-08-09 DIAGNOSIS — E785 Hyperlipidemia, unspecified: Secondary | ICD-10-CM | POA: Diagnosis not present

## 2023-08-09 DIAGNOSIS — L603 Nail dystrophy: Secondary | ICD-10-CM | POA: Diagnosis not present

## 2023-08-09 DIAGNOSIS — S72002D Fracture of unspecified part of neck of left femur, subsequent encounter for closed fracture with routine healing: Secondary | ICD-10-CM | POA: Diagnosis not present

## 2023-08-09 DIAGNOSIS — S0990XA Unspecified injury of head, initial encounter: Secondary | ICD-10-CM | POA: Diagnosis not present

## 2023-08-09 NOTE — Progress Notes (Signed)
 [facility]                                                                                                                                                                         Patient Demographics  [name], is a 78 y.o. male  WUJ:[id number]  NFA:[id number]  DOB - [birth year]  Admit date - 08/05/2023  Admitting Physician Walton Guppy, MD  Outpatient Primary MD for the patient is Sari Cunning, MD  LOS - 4   Chief Complaint  Patient presents with   Code Stroke        Assessment & Plan    Patient seen briefly today due for discharge soon per Discharge done yesterday by me, await SNF bed, no further issues, Vital signs stable, patient feels fine.      Medications  Scheduled Meds:  aspirin  EC  81 mg Oral Daily   atorvastatin   80 mg Oral Daily   clopidogrel   75 mg Oral Daily   enoxaparin  (LOVENOX ) injection  40 mg Subcutaneous Daily   ezetimibe   10 mg Oral Daily   pantoprazole   40 mg Oral Daily   umeclidinium bromide   1 puff Inhalation Daily   Continuous Infusions: PRN Meds:.acetaminophen  **OR** acetaminophen  (TYLENOL ) oral liquid 160 mg/5 mL **OR** acetaminophen , HYDROcodone -acetaminophen , ipratropium-albuterol , senna-docusate    Time Spent in minutes   10 minutes   Lynnwood Sauer M.D on 08/09/2023 at 7:31 AM  Between 7am to 7pm - Pager - [phone]  After 7pm go to [email or web address] - password [email or web address]  And look for the night coverage person covering for me after hours  [facility] Office  [phone]    Subjective:   [name] today has, No headache, No chest pain, No abdominal pain - No Nausea, No new weakness tingling or numbness, No Cough - SOB.    Objective:   Vitals:   08/08/23 1919 08/09/23 0000 08/09/23 0106 08/09/23 0423  BP: 131/70 (!) 149/74 (!) 149/80 (!) 153/71  Pulse: 64 60 (!) 58 60  Resp: 18 17 16 18   Temp: 97.6 F (36.4 C) (!) 97.2 F  (36.2 C) 98.6 F (37 C) (!) 97.2 F (36.2 C)  TempSrc: Oral Oral Oral Oral  SpO2: 94% 96% 97% 96%  Weight:        Wt Readings from Last 3 Encounters:  08/08/23 74.4 kg  07/28/23 76.5 kg  07/11/23 76.5 kg     Intake/Output Summary (Last 24 hours) at 08/09/2023 0731 Last data filed at 08/08/2023 1744 Gross per 24 hour  Intake 240 ml  Output --  Net 240 ml    Exam  Awake Alert, No new F.N deficits, Normal affect [place].AT,PERRAL Supple Neck, No JVD,   Symmetrical Chest wall  movement, Good air movement bilaterally, CTAB RRR,No Gallops, Rubs or new Murmurs,  +ve B.Sounds, Abd Soft, No tenderness,   No Cyanosis, Clubbing or edema   Data Review

## 2023-08-09 NOTE — Progress Notes (Addendum)
 1545 Call in report to the  [facility] facility and Spoke with [place], [place].  IV removed and dry drsg applied. Pt belongings including phone, phone charger and eyeglasses all accounted for and in pt belongings bag. Pt cleaned up and all ready for pickup. Waiting on transport team: [facility] to arrive.   1753 [facility] here to pick up pt. This RN and transport team verified dentures in pt mouth.

## 2023-08-09 NOTE — Plan of Care (Signed)
  Problem: Ischemic Stroke/TIA Tissue Perfusion: Goal: Complications of ischemic stroke/TIA will be minimized 08/09/2023 0452 by Bronte Sabado, RN Outcome: Progressing 08/09/2023 0324 by Adalei Novell, RN Outcome: Progressing   Problem: Coping: Goal: Will verbalize positive feelings about self 08/09/2023 0452 by Parris Bolognese, RN Outcome: Progressing 08/09/2023 0324 by Keltin Baird, RN Outcome: Progressing Goal: Will identify appropriate support needs 08/09/2023 0452 by Parris Bolognese, RN Outcome: Progressing 08/09/2023 0324 by Parris Bolognese, RN Outcome: Progressing   Problem: Health Behavior/Discharge Planning: Goal: Ability to manage health-related needs will improve 08/09/2023 0452 by Parris Bolognese, RN Outcome: Progressing 08/09/2023 0324 by Hasten Sweitzer, RN Outcome: Not Progressing Goal: Goals will be collaboratively established with patient/family 08/09/2023 0452 by Parris Bolognese, RN Outcome: Progressing 08/09/2023 0324 by Parris Bolognese, RN Outcome: Not Progressing   Problem: Self-Care: Goal: Ability to participate in self-care as condition permits will improve 08/09/2023 0452 by Parris Bolognese, RN Outcome: Not Progressing 08/09/2023 0324 by Miles Borkowski, RN Outcome: Not Progressing Goal: Verbalization of feelings and concerns over difficulty with self-care will improve 08/09/2023 0452 by Parris Bolognese, RN Outcome: Progressing 08/09/2023 0324 by Adrien Shankar, RN Outcome: Progressing Goal: Ability to communicate needs accurately will improve 08/09/2023 0452 by Parris Bolognese, RN Outcome: Progressing 08/09/2023 0324 by Chezney Huether, RN Outcome: Progressing   Problem: Nutrition: Goal: Risk of aspiration will decrease 08/09/2023 0452 by Karinda Cabriales, RN Outcome: Progressing 08/09/2023 0324 by Jaymason Ledesma, RN Outcome: Progressing Goal: Dietary intake will improve 08/09/2023 0452 by Parris Bolognese, RN Outcome: Progressing 08/09/2023 0324 by Loria Lacina, RN Outcome: Progressing   Problem: Education: Goal:  Knowledge of General Education information will improve Description: Including pain rating scale, medication(s)/side effects and non-pharmacologic comfort measures 08/09/2023 0452 by Josy Peaden, RN Outcome: Progressing 08/09/2023 0324 by Kenyon Eichelberger, RN Outcome: Progressing   Problem: Clinical Measurements: Goal: Ability to maintain clinical measurements within normal limits will improve 08/09/2023 0452 by Parris Bolognese, RN Outcome: Progressing 08/09/2023 0324 by Grayling Schranz, RN Outcome: Progressing Goal: Will remain free from infection 08/09/2023 0452 by Parris Bolognese, RN Outcome: Progressing 08/09/2023 0324 by Accalia Rigdon, RN Outcome: Progressing Goal: Diagnostic test results will improve 08/09/2023 0452 by Heiress Williamson, RN Outcome: Progressing 08/09/2023 0324 by Nealy Karapetian, RN Outcome: Progressing Goal: Respiratory complications will improve 08/09/2023 0452 by Venecia Mehl, RN Outcome: Progressing 08/09/2023 0324 by Zadok Holaway, RN Outcome: Progressing Goal: Cardiovascular complication will be avoided 08/09/2023 0452 by Tazaria Dlugosz, RN Outcome: Progressing 08/09/2023 0324 by Ariellah Faust, RN Outcome: Progressing   Problem: Nutrition: Goal: Adequate nutrition will be maintained 08/09/2023 0452 by Roniyah Llorens, RN Outcome: Progressing 08/09/2023 0324 by Ayaz Sondgeroth, RN Outcome: Progressing   Problem: Activity: Goal: Risk for activity intolerance will decrease 08/09/2023 0452 by Gay Moncivais, RN Outcome: Progressing 08/09/2023 0324 by Gerrianne Aydelott, RN Outcome: Progressing

## 2023-08-09 NOTE — Progress Notes (Signed)
 Speech Language Pathology Treatment: Dysphagia  Patient Details Name: [name] MRN: [id number] DOB: 18-Aug-1945 Today's Date: 08/09/2023 Time: 1445-1500 SLP Time Calculation (min) (ACUTE ONLY): 15 min  Assessment / Plan / Recommendation Clinical Impression  Pt was seen for dysphagia tx focused on compensatory strategies. Pt was alert and upright in chair throughout session. Pt was observed with honey-thick liquids and puree, displaying no overt signs of aspiration, however required max verbal and visual A to utilize compensatory strategies (chin-tuck, multiple swallows, liquid wash after puree). At the end of the session, patient recalled 3/3 swallowing compensatory strategies with min verbal A. Recommend continuation of current diet (D1/HTL) with full supervision for use of compensatory strategies upon d/c.     HPI HPI: Pt is a 78 y.o. male who presented to ED on 04/20 due to left-sided weakness and trouble speaking. Pt was recently recovering from pneumonia and recent hip surgery. MRI displayed "patchy acute infarcts in the Right MCA territory, most  confluent along the right lateral motor strip." PMHx: hypertension, hyperlipidemia, COPD, chronic hypoxic respiratory failure, AAA status post endovascular repair, CAD, HFmrEF, and left hip fracture status post hemiarthroplasty on 07/28/2023      SLP Plan  Continue with current plan of care      Recommendations for follow up therapy are one component of a multi-disciplinary discharge planning process, led by the attending physician.  Recommendations may be updated based on patient status, additional functional criteria and insurance authorization.    Recommendations  Diet recommendations: Dysphagia 1 (puree);Honey-thick liquid Liquids provided via: Cup Medication Administration: Whole meds with puree Supervision: Full supervision/cueing for compensatory strategies;Staff to assist with self feeding Compensations: Minimize environmental  distractions;Slow rate;Small sips/bites;Multiple dry swallows after each bite/sip;Follow solids with liquid;Chin tuck;Clear throat after each swallow Postural Changes and/or Swallow Maneuvers: Seated upright 90 degrees        Oral care BID   Frequent or constant Supervision/Assistance Dysphagia, oropharyngeal phase (R13.12)     Continue with current plan of care     Willa Brocks M.A., CCC-SLP 08/09/2023, 3:06 PM

## 2023-08-09 NOTE — TOC Transition Note (Signed)
 Transition of Care [facility]) - Discharge Note   Patient Details  Name: [name] MRN: [id number] Date of Birth: 08-Jul-1945  Transition of Care [facility]) CM/SW Contact:  Jannice Mends, LCSW Phone Number: 08/09/2023, 2:31 PM   Clinical Narrative:    Patient will DC to: [facility] Anticipated DC date: 08/09/23 Family notified: Son, Adolm Ahumada Transport by: Lyna Sandhoff called 2:31 PM    Per MD patient ready for DC to [facility]. RN to call report prior to discharge [phone] room 305b). RN, patient, patient's family, and facility notified of DC. Discharge Summary and FL2 sent to facility. DC packet on chart including signed DNR. Ambulance transport requested for patient.   CSW will sign off for now as social work intervention is no longer needed. Please consult us  again if new needs arise.     Final next level of care: Skilled Nursing Facility Barriers to Discharge: Barriers Resolved   Patient Goals and CMS Choice Patient states their goals for this hospitalization and ongoing recovery are:: Rehab CMS Medicare.gov Compare Post Acute Care list provided to:: Patient Represenative (must comment) Choice offered to / list presented to : Adult Children [place] ownership interest in [facility].provided to:: Adult Children    Discharge Placement   Existing PASRR number confirmed : 08/09/23          Patient chooses bed at: [facility] Patient to be transferred to facility by: [facility] Name of family member notified: Son, Shawn Patient and family notified of of transfer: 08/09/23  Discharge Plan and Services Additional resources added to the After Visit Summary for   In-house Referral: Clinical Social Work   Post Acute Care Choice: Skilled Nursing Facility                               Social Drivers of Health (SDOH) Interventions SDOH Screenings   Food Insecurity: No Food Insecurity (08/08/2023)  Housing: Low Risk  (08/08/2023)  Transportation Needs: No Transportation  Needs (08/08/2023)  Utilities: Not At Risk (08/08/2023)  Depression (PHQ2-9): Low Risk  (09/03/2018)  Financial Resource Strain: Low Risk  (01/03/2023)   Received from [facility]  Social Connections: Moderately Isolated (08/08/2023)  Stress: No Stress Concern Present (08/21/2018)  Tobacco Use: High Risk (08/05/2023)     Readmission Risk Interventions    04/28/2021    9:32 AM  Readmission Risk Prevention Plan  Transportation Screening Complete  PCP or Specialist Appt within 3-5 Days Complete  HRI or Home Care Consult Complete  Social Work Consult for Recovery Care Planning/Counseling Complete  Palliative Care Screening Not Applicable  Medication Review Oceanographer) Complete

## 2023-08-09 NOTE — Progress Notes (Signed)
 Physical Therapy Treatment Patient Details Name: [name] MRN: [id number] DOB: 1946-02-22 Today's Date: 08/09/2023   History of Present Illness Pt is a 78 yo male that presents 4/20 from SNF rehab with L sided weakness and speech difficulties. MRI brain showed acute infarcts in R MCA territory. Recent admissions 4/12-4/16 after fall, L hip fx and surgical repair. Admission in March d/t sepsis and PNA. PMH of COPD stage II, anxiety, stroke, neuromuscular disorder,depression, HTN, prostate cancer s/p seed implantation, HLD, lumbar disc disease, CAD, AAA , intraparenchymal hemorrhage in 02/2021, , nephrolithiasis, GERD, tobacco use and chronic ischemic heart failure.    PT Comments  Eager to participate with physical therapy and to get out of bed today. Wished to attempt [facility] transfer for BM. Required mod assist to stand with RW, posterior lean. Max assist for stand pivot transfer (partial squat.) Unable to successfully complete BM. Up in recliner, recommended to NT use +2 assist and Stedy for transfers. Tolerated LE exercises well. On 3L supplemental O2 and maintained 90% and greater during session. Patient will benefit from continued inpatient follow up therapy, <3 hours/day. Patient will continue to benefit from skilled physical therapy services to further improve independence with functional mobility.    If plan is discharge home, recommend the following: Two people to help with walking and/or transfers;Two people to help with bathing/dressing/bathroom;Assist for transportation;Assistance with cooking/housework   Can travel by private vehicle     No  Equipment Recommendations  None recommended by PT;Other (comment) (Or TBD back at [facility])    Recommendations for Other Services       Precautions / Restrictions Precautions Precautions: Fall Recall of Precautions/Restrictions: Impaired Restrictions Weight Bearing Restrictions Per Provider Order: Yes LLE Weight Bearing Per Provider Order:  Weight bearing as tolerated     Mobility  Bed Mobility Overal bed mobility: Needs Assistance Bed Mobility: Supine to Sit     Supine to sit: HOB elevated, Min assist, Used rails     General bed mobility comments: Min assist for trunk and LLE support to rise to EOB. Good job facilitating and with cues able to use rail with RUE, assisted to grab with LUE but poor grip to sustain.    Transfers Overall transfer level: Needs assistance Equipment used: Rolling walker (2 wheels), None Transfers: Sit to/from Stand, Bed to chair/wheelchair/BSC Sit to Stand: Mod assist, From elevated surface Stand pivot transfers: Max assist, From elevated surface         General transfer comment: Mod assist for boost to stand from elevated surface today, cues for anterior weight shift over RW, supported LUE to grip. Performed x2 but unable to step or pivot with this device. Performed stand pivot (partial squat) with Lt knee block to BSC then recliner with max cues and assist to facilitate forward weight shift, boost, balance and and to extend hips and knees. Pt with posterior lean during training today.    Ambulation/Gait                   Stairs             Wheelchair Mobility     Tilt Bed    Modified Rankin (Stroke Patients Only) Modified Rankin (Stroke Patients Only) Pre-Morbid Rankin Score: Severe disability Modified Rankin: Severe disability     Balance Overall balance assessment: Needs assistance Sitting-balance support: Feet supported, No upper extremity supported, Single extremity supported Sitting balance-Leahy Scale: Fair Sitting balance - Comments: supervision at EOB   Standing balance support: Bilateral  upper extremity supported, During functional activity Standing balance-Leahy Scale: Poor Standing balance comment: Posterior lean.                            Communication Communication Communication: Impaired Factors Affecting Communication:  Reduced clarity of speech  Cognition Arousal: Alert, Lethargic Behavior During Therapy: Flat affect, WFL for tasks assessed/performed   PT - Cognitive impairments: No family/caregiver present to determine baseline                       PT - Cognition Comments: More alert and interactive but quickly falls asleep after transfer to recliner. Following commands: Impaired Following commands impaired: Follows one step commands with increased time, Only follows one step commands consistently    Cueing Cueing Techniques: Verbal cues, Tactile cues  Exercises General Exercises - Lower Extremity Ankle Circles/Pumps: AROM, Both, 10 reps, Seated Quad Sets: Strengthening, Both, 15 reps, Seated Long Arc Quad: Strengthening, Left, 10 reps, Seated    General Comments General comments (skin integrity, edema, etc.): SpO2 91% with activity on 3L supplemental O2. Unable to have BM on [facility] but having urgency to go.      Pertinent Vitals/Pain Pain Assessment Pain Assessment: No/denies pain    Home Living                          Prior Function            PT Goals (current goals can now be found in the care plan section) Acute Rehab PT Goals Patient Stated Goal: Return to rehab PT Goal Formulation: With patient Time For Goal Achievement: 08/21/23 Potential to Achieve Goals: Fair Progress towards PT goals: Progressing toward goals    Frequency    Min 2X/week      PT Plan      Co-evaluation              AM-PAC PT "6 Clicks" Mobility   Outcome Measure  Help needed turning from your back to your side while in a flat bed without using bedrails?: A Little Help needed moving from lying on your back to sitting on the side of a flat bed without using bedrails?: A Little Help needed moving to and from a bed to a chair (including a wheelchair)?: A Lot Help needed standing up from a chair using your arms (e.g., wheelchair or bedside chair)?: A Lot Help needed to walk  in hospital room?: Total Help needed climbing 3-5 steps with a railing? : Total 6 Click Score: 12    End of Session Equipment Utilized During Treatment: Gait belt;Oxygen  Activity Tolerance: Patient tolerated treatment well Patient left: in chair;with call bell/phone within reach;with chair alarm set Nurse Communication: Need for lift equipment (Nurse tech, recommended Stedy +2 assist) PT Visit Diagnosis: Muscle weakness (generalized) (M62.81);History of falling (Z91.81);Pain;Other symptoms and signs involving the nervous system (R29.898);Unsteadiness on feet (R26.81);Difficulty in walking, not elsewhere classified (R26.2);Hemiplegia and hemiparesis Hemiplegia - Right/Left: Left Pain - Right/Left: Left Pain - part of body: Hip     Time: 1403-1430 PT Time Calculation (min) (ACUTE ONLY): 27 min  Charges:    $Therapeutic Activity: 23-37 mins PT General Charges $$ ACUTE PT VISIT: 1 Visit                     Jory Ng, PT, DPT [facility]  Rehabilitation Services Physical Therapist Office: [phone] Website: [email or web address]  Alinda Irani 08/09/2023, 3:14 PM

## 2023-08-09 NOTE — Plan of Care (Signed)
  Problem: Education: Goal: Knowledge of disease or condition will improve Outcome: Progressing Goal: Knowledge of secondary prevention will improve (MUST DOCUMENT ALL) Outcome: Progressing   Problem: Ischemic Stroke/TIA Tissue Perfusion: Goal: Complications of ischemic stroke/TIA will be minimized Outcome: Progressing   Problem: Coping: Goal: Will verbalize positive feelings about self Outcome: Progressing Goal: Will identify appropriate support needs Outcome: Progressing   Problem: Health Behavior/Discharge Planning: Goal: Ability to manage health-related needs will improve Outcome: Not Progressing Goal: Goals will be collaboratively established with patient/family Outcome: Not Progressing   Problem: Self-Care: Goal: Ability to participate in self-care as condition permits will improve Outcome: Not Progressing Goal: Verbalization of feelings and concerns over difficulty with self-care will improve Outcome: Progressing Goal: Ability to communicate needs accurately will improve Outcome: Progressing   Problem: Nutrition: Goal: Risk of aspiration will decrease Outcome: Progressing Goal: Dietary intake will improve Outcome: Progressing   Problem: Education: Goal: Knowledge of General Education information will improve Description: Including pain rating scale, medication(s)/side effects and non-pharmacologic comfort measures Outcome: Progressing   Problem: Health Behavior/Discharge Planning: Goal: Ability to manage health-related needs will improve Outcome: Progressing   Problem: Clinical Measurements: Goal: Ability to maintain clinical measurements within normal limits will improve Outcome: Progressing Goal: Will remain free from infection Outcome: Progressing Goal: Diagnostic test results will improve Outcome: Progressing Goal: Respiratory complications will improve Outcome: Progressing Goal: Cardiovascular complication will be avoided Outcome: Progressing    Problem: Activity: Goal: Risk for activity intolerance will decrease Outcome: Progressing   Problem: Nutrition: Goal: Adequate nutrition will be maintained Outcome: Progressing   Problem: Coping: Goal: Level of anxiety will decrease Outcome: Progressing   Problem: Elimination: Goal: Will not experience complications related to bowel motility Outcome: Progressing Goal: Will not experience complications related to urinary retention Outcome: Progressing   Problem: Pain Managment: Goal: General experience of comfort will improve and/or be controlled Outcome: Progressing   Problem: Safety: Goal: Ability to remain free from injury will improve Outcome: Progressing   Problem: Skin Integrity: Goal: Risk for impaired skin integrity will decrease Outcome: Progressing

## 2023-08-09 NOTE — TOC Progression Note (Addendum)
 Transition of Care [facility]) - Progression Note    Patient Details  Name: [name] MRN: [id number] Date of Birth: 17-Jun-1945  [facility]) CM/SW Contact  Jannice Mends, LCSW Phone Number: 08/09/2023, 8:40 AM  Clinical Narrative:    8:40am-SNF Peer to peer requested by insurance. Info sent to MD.   Lyna Sandhoff transport approved, Auth# [id number].   2:28 PM-CSW received SNF approval for [facility], Auth# [id number] for 7 days. CSW updated SNF and patient's son Adolm Ahumada. CSW discussed Medicaid process and son is aware and they are currently planning a spend down for patient.   Expected Discharge Plan: Skilled Nursing Facility Barriers to Discharge: Continued Medical Work up, English as a second language teacher  Expected Discharge Plan and Services In-house Referral: Clinical Social Work   Post Acute Care Choice: Skilled Nursing Facility Living arrangements for the past 2 months: Single Family Home Expected Discharge Date: 08/08/23                                     Social Determinants of Health (SDOH) Interventions SDOH Screenings   Food Insecurity: No Food Insecurity (08/08/2023)  Housing: Low Risk  (08/08/2023)  Transportation Needs: No Transportation Needs (08/08/2023)  Utilities: Not At Risk (08/08/2023)  Depression (PHQ2-9): Low Risk  (09/03/2018)  Financial Resource Strain: Low Risk  (01/03/2023)   Received from [facility]  Social Connections: Moderately Isolated (08/08/2023)  Stress: No Stress Concern Present (08/21/2018)  Tobacco Use: High Risk (08/05/2023)    Readmission Risk Interventions    04/28/2021    9:32 AM  Readmission Risk Prevention Plan  Transportation Screening Complete  PCP or Specialist Appt within 3-5 Days Complete  HRI or Home Care Consult Complete  Social Work Consult for Recovery Care Planning/Counseling Complete  Palliative Care Screening Not Applicable  Medication Review Oceanographer) Complete

## 2023-08-10 DIAGNOSIS — I10 Essential (primary) hypertension: Secondary | ICD-10-CM | POA: Diagnosis not present

## 2023-08-10 DIAGNOSIS — I2510 Atherosclerotic heart disease of native coronary artery without angina pectoris: Secondary | ICD-10-CM | POA: Diagnosis not present

## 2023-08-10 DIAGNOSIS — Z5189 Encounter for other specified aftercare: Secondary | ICD-10-CM | POA: Diagnosis not present

## 2023-08-10 DIAGNOSIS — J449 Chronic obstructive pulmonary disease, unspecified: Secondary | ICD-10-CM | POA: Diagnosis not present

## 2023-08-10 DIAGNOSIS — I63411 Cerebral infarction due to embolism of right middle cerebral artery: Secondary | ICD-10-CM | POA: Diagnosis not present

## 2023-08-10 NOTE — Progress Notes (Signed)
 [facility] Liaison Note  08/10/2023  [name] September 30, 1945 [id number]  Location: RN Hospital Liaison screened the patient remotely at [facility].  Insurance: [employer]   [name] is a 78 y.o. male who is a Primary Care Patient of Sari Cunning, MD [facility]. The patient was screened for 30 day readmission hospitalization with noted high risk score for unplanned readmission risk with 3 IP/1 ED in 6 months.  The patient was assessed for potential Care Management service needs for post hospital transition for care coordination. Review of patient's electronic medical record reveals patient was admitted for a Code Stroke. Pt recommended for SNF and transitioned to [facility] for ongoing rehabilitation. This facility will continue to address pt's ongoing issues.    [facility]I Care Management/Population Health does not replace or interfere with any arrangements made by the Inpatient Transition of Care team.   For questions contact:   Lilla Reichert, RN, BSN Hospital Liaison [place]   [facility], Population Health Office Hours MTWF  8:00 am-6:00 pm Direct Dial: [phone] mobile [email or web address]

## 2023-08-13 DIAGNOSIS — I2510 Atherosclerotic heart disease of native coronary artery without angina pectoris: Secondary | ICD-10-CM | POA: Diagnosis not present

## 2023-08-13 DIAGNOSIS — I63411 Cerebral infarction due to embolism of right middle cerebral artery: Secondary | ICD-10-CM | POA: Diagnosis not present

## 2023-08-13 DIAGNOSIS — I5020 Unspecified systolic (congestive) heart failure: Secondary | ICD-10-CM | POA: Diagnosis not present

## 2023-08-13 DIAGNOSIS — K5900 Constipation, unspecified: Secondary | ICD-10-CM | POA: Diagnosis not present

## 2023-08-13 DIAGNOSIS — J449 Chronic obstructive pulmonary disease, unspecified: Secondary | ICD-10-CM | POA: Diagnosis not present

## 2023-08-13 DIAGNOSIS — I10 Essential (primary) hypertension: Secondary | ICD-10-CM | POA: Diagnosis not present

## 2023-08-15 DIAGNOSIS — I5020 Unspecified systolic (congestive) heart failure: Secondary | ICD-10-CM | POA: Diagnosis not present

## 2023-08-15 DIAGNOSIS — J449 Chronic obstructive pulmonary disease, unspecified: Secondary | ICD-10-CM | POA: Diagnosis not present

## 2023-08-15 DIAGNOSIS — I2510 Atherosclerotic heart disease of native coronary artery without angina pectoris: Secondary | ICD-10-CM | POA: Diagnosis not present

## 2023-08-15 DIAGNOSIS — I63411 Cerebral infarction due to embolism of right middle cerebral artery: Secondary | ICD-10-CM | POA: Diagnosis not present

## 2023-08-15 DIAGNOSIS — K5900 Constipation, unspecified: Secondary | ICD-10-CM | POA: Diagnosis not present

## 2023-08-15 DIAGNOSIS — I10 Essential (primary) hypertension: Secondary | ICD-10-CM | POA: Diagnosis not present

## 2023-08-16 DIAGNOSIS — S72002A Fracture of unspecified part of neck of left femur, initial encounter for closed fracture: Secondary | ICD-10-CM | POA: Diagnosis not present

## 2023-08-16 DIAGNOSIS — J449 Chronic obstructive pulmonary disease, unspecified: Secondary | ICD-10-CM | POA: Diagnosis not present

## 2023-08-16 DIAGNOSIS — I5020 Unspecified systolic (congestive) heart failure: Secondary | ICD-10-CM | POA: Diagnosis not present

## 2023-08-16 DIAGNOSIS — J9611 Chronic respiratory failure with hypoxia: Secondary | ICD-10-CM | POA: Diagnosis not present

## 2023-08-16 DIAGNOSIS — D62 Acute posthemorrhagic anemia: Secondary | ICD-10-CM | POA: Diagnosis not present

## 2023-08-17 DIAGNOSIS — J449 Chronic obstructive pulmonary disease, unspecified: Secondary | ICD-10-CM | POA: Diagnosis not present

## 2023-08-17 DIAGNOSIS — I639 Cerebral infarction, unspecified: Secondary | ICD-10-CM | POA: Diagnosis not present

## 2023-08-17 DIAGNOSIS — I10 Essential (primary) hypertension: Secondary | ICD-10-CM | POA: Diagnosis not present

## 2023-08-17 DIAGNOSIS — K5900 Constipation, unspecified: Secondary | ICD-10-CM | POA: Diagnosis not present

## 2023-08-17 DIAGNOSIS — I2510 Atherosclerotic heart disease of native coronary artery without angina pectoris: Secondary | ICD-10-CM | POA: Diagnosis not present

## 2023-08-20 DIAGNOSIS — Z7982 Long term (current) use of aspirin: Secondary | ICD-10-CM | POA: Diagnosis not present

## 2023-08-20 DIAGNOSIS — I2510 Atherosclerotic heart disease of native coronary artery without angina pectoris: Secondary | ICD-10-CM | POA: Diagnosis not present

## 2023-08-20 DIAGNOSIS — I509 Heart failure, unspecified: Secondary | ICD-10-CM | POA: Diagnosis not present

## 2023-08-20 DIAGNOSIS — J449 Chronic obstructive pulmonary disease, unspecified: Secondary | ICD-10-CM | POA: Diagnosis not present

## 2023-08-20 DIAGNOSIS — I639 Cerebral infarction, unspecified: Secondary | ICD-10-CM | POA: Diagnosis not present

## 2023-08-20 DIAGNOSIS — Z9981 Dependence on supplemental oxygen: Secondary | ICD-10-CM | POA: Diagnosis not present

## 2023-08-20 DIAGNOSIS — R531 Weakness: Secondary | ICD-10-CM | POA: Diagnosis not present

## 2023-08-20 DIAGNOSIS — I10 Essential (primary) hypertension: Secondary | ICD-10-CM | POA: Diagnosis not present

## 2023-08-24 DIAGNOSIS — I5020 Unspecified systolic (congestive) heart failure: Secondary | ICD-10-CM | POA: Diagnosis not present

## 2023-08-24 DIAGNOSIS — I2510 Atherosclerotic heart disease of native coronary artery without angina pectoris: Secondary | ICD-10-CM | POA: Diagnosis not present

## 2023-08-24 DIAGNOSIS — Z9981 Dependence on supplemental oxygen: Secondary | ICD-10-CM | POA: Diagnosis not present

## 2023-08-24 DIAGNOSIS — I69354 Hemiplegia and hemiparesis following cerebral infarction affecting left non-dominant side: Secondary | ICD-10-CM | POA: Diagnosis not present

## 2023-08-24 DIAGNOSIS — J449 Chronic obstructive pulmonary disease, unspecified: Secondary | ICD-10-CM | POA: Diagnosis not present

## 2023-08-24 DIAGNOSIS — I69322 Dysarthria following cerebral infarction: Secondary | ICD-10-CM | POA: Diagnosis not present

## 2023-08-24 DIAGNOSIS — I10 Essential (primary) hypertension: Secondary | ICD-10-CM | POA: Diagnosis not present

## 2023-08-31 DIAGNOSIS — I69354 Hemiplegia and hemiparesis following cerebral infarction affecting left non-dominant side: Secondary | ICD-10-CM | POA: Diagnosis not present

## 2023-08-31 DIAGNOSIS — Z9981 Dependence on supplemental oxygen: Secondary | ICD-10-CM | POA: Diagnosis not present

## 2023-08-31 DIAGNOSIS — I5020 Unspecified systolic (congestive) heart failure: Secondary | ICD-10-CM | POA: Diagnosis not present

## 2023-08-31 DIAGNOSIS — I69322 Dysarthria following cerebral infarction: Secondary | ICD-10-CM | POA: Diagnosis not present

## 2023-08-31 DIAGNOSIS — I10 Essential (primary) hypertension: Secondary | ICD-10-CM | POA: Diagnosis not present

## 2023-08-31 DIAGNOSIS — J449 Chronic obstructive pulmonary disease, unspecified: Secondary | ICD-10-CM | POA: Diagnosis not present

## 2023-08-31 DIAGNOSIS — I2510 Atherosclerotic heart disease of native coronary artery without angina pectoris: Secondary | ICD-10-CM | POA: Diagnosis not present

## 2023-09-03 DIAGNOSIS — R296 Repeated falls: Secondary | ICD-10-CM | POA: Diagnosis not present

## 2023-09-06 DIAGNOSIS — I6930 Unspecified sequelae of cerebral infarction: Secondary | ICD-10-CM | POA: Diagnosis not present

## 2023-09-06 DIAGNOSIS — Z9981 Dependence on supplemental oxygen: Secondary | ICD-10-CM | POA: Diagnosis not present

## 2023-09-06 DIAGNOSIS — M6281 Muscle weakness (generalized): Secondary | ICD-10-CM | POA: Diagnosis not present

## 2023-09-06 DIAGNOSIS — J441 Chronic obstructive pulmonary disease with (acute) exacerbation: Secondary | ICD-10-CM | POA: Diagnosis not present

## 2023-09-06 DIAGNOSIS — I10 Essential (primary) hypertension: Secondary | ICD-10-CM | POA: Diagnosis not present

## 2023-09-06 DIAGNOSIS — I2510 Atherosclerotic heart disease of native coronary artery without angina pectoris: Secondary | ICD-10-CM | POA: Diagnosis not present

## 2023-09-06 DIAGNOSIS — I509 Heart failure, unspecified: Secondary | ICD-10-CM | POA: Diagnosis not present

## 2023-09-13 ENCOUNTER — Encounter (HOSPITAL_COMMUNITY): Payer: Self-pay | Admitting: Emergency Medicine

## 2023-09-13 ENCOUNTER — Emergency Department (HOSPITAL_COMMUNITY)
Admission: EM | Admit: 2023-09-13 | Discharge: 2023-09-13 | Disposition: A | Discharge status: Skilled Nursing Facility | Attending: Emergency Medicine | Admitting: Emergency Medicine

## 2023-09-13 ENCOUNTER — Other Ambulatory Visit: Payer: Self-pay

## 2023-09-13 ENCOUNTER — Emergency Department (HOSPITAL_COMMUNITY)

## 2023-09-13 DIAGNOSIS — W050XXA Fall from non-moving wheelchair, initial encounter: Secondary | ICD-10-CM | POA: Diagnosis not present

## 2023-09-13 DIAGNOSIS — Z7982 Long term (current) use of aspirin: Secondary | ICD-10-CM | POA: Insufficient documentation

## 2023-09-13 DIAGNOSIS — Z8673 Personal history of transient ischemic attack (TIA), and cerebral infarction without residual deficits: Secondary | ICD-10-CM | POA: Diagnosis not present

## 2023-09-13 DIAGNOSIS — J449 Chronic obstructive pulmonary disease, unspecified: Secondary | ICD-10-CM | POA: Insufficient documentation

## 2023-09-13 DIAGNOSIS — I2510 Atherosclerotic heart disease of native coronary artery without angina pectoris: Secondary | ICD-10-CM | POA: Diagnosis not present

## 2023-09-13 DIAGNOSIS — S0990XA Unspecified injury of head, initial encounter: Secondary | ICD-10-CM | POA: Insufficient documentation

## 2023-09-13 DIAGNOSIS — Z7902 Long term (current) use of antithrombotics/antiplatelets: Secondary | ICD-10-CM | POA: Diagnosis not present

## 2023-09-13 DIAGNOSIS — Z96642 Presence of left artificial hip joint: Secondary | ICD-10-CM | POA: Diagnosis not present

## 2023-09-13 DIAGNOSIS — Z8546 Personal history of malignant neoplasm of prostate: Secondary | ICD-10-CM | POA: Insufficient documentation

## 2023-09-13 DIAGNOSIS — Z043 Encounter for examination and observation following other accident: Secondary | ICD-10-CM | POA: Diagnosis not present

## 2023-09-13 DIAGNOSIS — S73102A Unspecified sprain of left hip, initial encounter: Secondary | ICD-10-CM | POA: Insufficient documentation

## 2023-09-13 DIAGNOSIS — I10 Essential (primary) hypertension: Secondary | ICD-10-CM | POA: Insufficient documentation

## 2023-09-13 DIAGNOSIS — W19XXXA Unspecified fall, initial encounter: Secondary | ICD-10-CM

## 2023-09-13 DIAGNOSIS — Z79899 Other long term (current) drug therapy: Secondary | ICD-10-CM | POA: Insufficient documentation

## 2023-09-13 DIAGNOSIS — I6523 Occlusion and stenosis of bilateral carotid arteries: Secondary | ICD-10-CM | POA: Diagnosis not present

## 2023-09-13 MED ORDER — ACETAMINOPHEN 325 MG PO TABS
650.0000 mg | ORAL_TABLET | Freq: Once | ORAL | Status: AC
  Administered 2023-09-13: 650 mg via ORAL
  Filled 2023-09-13: qty 2

## 2023-09-13 NOTE — ED Notes (Signed)
 Patient discharged in stable condition, education materials explained including, follow up, any prescriptions and reasons to return. Patient voiced agreement to education and discharge material.

## 2023-09-13 NOTE — Progress Notes (Signed)
 Orthopedic Tech Progress Note Patient Details:  [name] June 27, 1945 [id number]  Patient ID: [name], male   DOB: 20-Sep-1945, 77 y.o.   MRN: [id number] LV2T not needed.   [name] [name] 09/13/2023, 1:04 AM

## 2023-09-13 NOTE — ED Provider Notes (Signed)
 [place] EMERGENCY DEPARTMENT AT [facility] Provider Note   CSN: [id number] Arrival date & time: 09/13/23  0058     History  Chief Complaint  Patient presents with   Luan Rumpf is a 78 y.o. male.  The history is provided by the patient and the EMS personnel.  Patient with history of chronic respiratory failure on oxygen , hypertension, previous CVA currently on clopidogrel  presents after accidental fall.  Patient presents via EMS as a level 2 trauma after accidental fall at local nursing facility.  Patient tried to get out of wheelchair on his own and fell down.  He did hit his head and reports pain in his left hip.  He has had previous history of left hip replacement.  He denies any neck or back pain.  No chest or abdominal pain.  Past Medical History:  Diagnosis Date   AAA (abdominal aortic aneurysm) (HCC)    a.  CTA 7/19: measured 4.5 x 5.3 cm and greatest transverse dimensions   Abnormal nuclear stress test    a.  Myoview  2012: anterior wall ischemia with an estimated EF of 26%.  This was a new wall motion abnormality as well as a newly reduced EF   Brain bleed (HCC)    COPD (chronic obstructive pulmonary disease) (HCC)    Coronary artery disease    a.  Posterior MI in 2002 status post BMS to the LCx; b. LHC 2012: 95% stenosis of the proximal LAD, 95% stenosis of the mid LAD, 30% in-stent restenosis of the mid left circumflex with a second lesion of diffuse 50% stenosis.  The patient underwent successful PCI/BMS to the mid LAD with 0% residual stenosis, LV gram not performed   GERD (gastroesophageal reflux disease)    History of kidney stones    History of prostate cancer    Hyperlipidemia    Hypertension    Myocardial infarction [facility])    Neuromuscular disorder (HCC)    Prostate cancer (HCC)    a.  Status post seeding   PVD (peripheral vascular disease) (HCC)    Stroke (HCC)       Home Medications Prior to Admission medications   Medication  Sig Start Date End Date Taking? Authorizing Provider  acetaminophen  (TYLENOL ) 325 MG tablet Take 2 tablets (650 mg total) by mouth every 6 (six) hours as needed for mild pain (or Fever >/= 101). 04/28/21   Elgergawy, Ardia Kraft, MD  albuterol  (PROVENTIL ) (2.5 MG/3ML) 0.083% nebulizer solution Take 2.5 mg by nebulization every 6 (six) hours as needed. 10/09/22   [provider]  albuterol  (VENTOLIN  HFA) 108 (90 Base) MCG/ACT inhaler Inhale 2 puffs into the lungs every 6 (six) hours as needed for wheezing. 04/07/21 11/30/24  Medina-Vargas, Monina C, NP  aspirin  EC 81 MG tablet Take 1 tablet (81 mg total) by mouth daily. 08/08/23   Singh, Prashant K, MD  atorvastatin  (LIPITOR ) 80 MG tablet Take 1 tablet (80 mg total) by mouth daily. 06/28/23   Charlette Console, FNP  clopidogrel  (PLAVIX ) 75 MG tablet Take 1 tablet (75 mg total) by mouth daily. 08/08/23   Singh, Prashant K, MD  dapagliflozin  propanediol (FARXIGA ) 10 MG TABS tablet Take 1 tablet (10 mg total) by mouth daily. 06/28/23   Charlette Console, FNP  ezetimibe  (ZETIA ) 10 MG tablet Take 1 tablet (10 mg total) by mouth daily. 08/08/23   Singh, Prashant K, MD  furosemide  (LASIX ) 20 MG tablet Take 20 mg by mouth  See admin instructions. Take one tablet by mouth on Sunday, Tuesday, Wednesday, Thursday, and Saturday.    [provider]  furosemide  (LASIX ) 40 MG tablet Take 40 mg by mouth 2 (two) times a week. Take one tablet my mouth on Monday and Friday. 08/02/23   [provider]  ipratropium-albuterol  (DUONEB) 0.5-2.5 (3) MG/3ML SOLN Use twice a day scheduled and every 4 hours as needed for shortness of breath and wheezing Patient taking differently: Inhale 3 mLs into the lungs 2 (two) times daily. 07/07/23   Singh, Prashant K, MD  ipratropium-albuterol  (DUONEB) 0.5-2.5 (3) MG/3ML SOLN Inhale 3 mLs into the lungs every 4 (four) hours as needed (Shortness of breath or wheezing).    [provider]  midodrine  (PROAMATINE ) 5 MG tablet  Take 1 tablet (5 mg total) by mouth 2 (two) times daily with a meal. 07/11/23   Singh, Prashant K, MD  Multiple Vitamins-Minerals (MULTIVITAMIN WITH MINERALS) tablet Take 1 tablet by mouth daily.    [provider]  omeprazole  (PRILOSEC) 40 MG capsule TAKE 1 CAPSULE BY MOUTH DAILY USUALLY BEFORE BREAKFAST 04/07/21   Medina-Vargas, Monina C, NP  spironolactone  (ALDACTONE ) 25 MG tablet Take 0.5 tablets (12.5 mg total) by mouth daily. FOR CHRONIC COMBINED CHF 06/28/23   Shawnee Dellen A, FNP  tiotropium (SPIRIVA  HANDIHALER) 18 MCG inhalation capsule Place 1 capsule (18 mcg total) into inhaler and inhale daily. 07/07/23 07/06/24  Cala Castleman, MD      Allergies    Lyrica [pregabalin]    Review of Systems   Review of Systems  Musculoskeletal:  Positive for arthralgias. Negative for back pain and neck pain.    Physical Exam Updated Vital Signs BP (!) 156/72   Pulse (!) 39   Temp 98.1 F (36.7 C)   Resp 17   Ht 1.829 m (6')   Wt 74.4 kg   SpO2 100%   BMI 22.25 kg/m  Physical Exam CONSTITUTIONAL: Chronically ill-appearing, no acute distress HEAD: Normocephalic/atraumatic, no visible trauma ENMT: Mucous membranes moist, no visible trauma NECK: supple no meningeal signs SPINE/BACK:entire spine nontender No bruising/crepitance/stepoffs noted to spine CV: S1/S2 noted, no murmurs/rubs/gallops noted LUNGS: Lungs are clear to auscultation bilaterally, no apparent distress Chest-no bruising ABDOMEN: soft, nontender, no bruising NEURO: Pt is awake/alert, no distress EXTREMITIES: pulses normal/equal, full  Pelvis stable Full range of both lower extremities out difficulty.  Abrasion to the left elbow, but no other signs of acute traumatic injuries SKIN: warm, color normal  ED Results / Procedures / Treatments   Labs (all labs ordered are listed, but only abnormal results are displayed) Labs Reviewed - No data to display  EKG None  Radiology DG Hip Conception W or  [place] Pelvis 1 View Left Result Date: 09/13/2023 CLINICAL DATA:  Fall EXAM: DG HIP (WITH OR WITHOUT PELVIS) 1V PORT LEFT COMPARISON:  None Available. FINDINGS: Left hip hemiarthroplasty without adverse features. No acute fracture or dislocation. Multiple vascular clips in the region of the prostate. IMPRESSION: Left hip hemiarthroplasty without adverse features. Electronically Signed   By: Juanetta Nordmann M.D.   On: 09/13/2023 01:25   CT Head Wo Contrast Result Date: 09/13/2023 CLINICAL DATA:  Fall EXAM: CT HEAD WITHOUT CONTRAST TECHNIQUE: Contiguous axial images were obtained from the base of the skull through the vertex without intravenous contrast. RADIATION DOSE REDUCTION: This exam was performed according to the departmental dose-optimization program which includes automated exposure control, adjustment of the mA and/or kV according to patient size and/or  use of iterative reconstruction technique. COMPARISON:  08/05/2023 FINDINGS: Brain: No mass, hemorrhage or extra-axial collection. Old infarcts of the right frontal lobe, right temporal lobe, right occipital lobe and left cerebellum. Vascular: Atherosclerotic calcification of the internal carotid arteries at the skull base. No abnormal hyperdensity of the major intracranial arteries or dural venous sinuses. Skull: The visualized skull base, calvarium and extracranial soft tissues are normal. Sinuses/Orbits: No fluid levels or advanced mucosal thickening of the visualized paranasal sinuses. No mastoid or middle ear effusion. Normal orbits. Other: None. IMPRESSION: 1. No acute intracranial abnormality. 2. Old infarcts of the right frontal lobe, right temporal lobe, right occipital lobe and left cerebellum. Electronically Signed   By: Juanetta Nordmann M.D.   On: 09/13/2023 01:25    Procedures Procedures    Medications Ordered in ED Medications  acetaminophen  (TYLENOL ) tablet 650 mg (has no administration in time range)    ED Course/ Medical Decision  Making/ A&P Clinical Course as of 09/13/23 0210  Thu Sep 13, 2023  0208 Patient appears after mechanical fall at nursing facility.  He is well-appearing, appears to be at his baseline.  Imaging negative.  No signs of acute traumatic injury.  Patient reports mild headache but no other complaints at this time. He requested I not call his family at this time.  Patient be discharged back to facility Pulse rate is not in the 30s, this was an error it has been mostly 50s to 70s [DW]    Clinical Course User Index [DW] Eldon Greenland, MD           Glasgow Coma Scale Score: 15      NEXUS Criteria Score: 0                Medical Decision Making Amount and/or Complexity of Data Reviewed Radiology: ordered.  Risk OTC drugs.   This patient presents to the ED for concern of fall with head trauma, this involves an extensive number of treatment options, and is a complaint that carries with it a high risk of complications and morbidity.  The differential diagnosis includes but is not limited to subdural hematoma, subarachnoid hemorrhage, skull fracture, concussion    Comorbidities that complicate the patient evaluation: Patient's presentation is complicated by their history of CVA  Social Determinants of Health: Patient's poor mobility  increases the complexity of managing their presentation  Additional history obtained: Additional history obtained from EMS  Records reviewed previous admission documents  Imaging Studies ordered: I ordered imaging studies including CT scan head and X-ray left hip  I independently visualized and interpreted imaging which showed no acute findings I agree with the radiologist interpretation  Cardiac Monitoring: The patient was maintained on a cardiac monitor.  I personally viewed and interpreted the cardiac monitor which showed an underlying rhythm of:  sinus rhythm  Test Considered: No indication for further labs or imaging given this is a mechanical  fall   Reevaluation: After the interventions noted above, I reevaluated the patient and found that they have :improved  Complexity of problems addressed: Patient's presentation is most consistent with  acute presentation with potential threat to life or bodily function  Disposition: After consideration of the diagnostic results and the patient's response to treatment,  I feel that the patent would benefit from discharge  .           Final Clinical Impression(s) / ED Diagnoses Final diagnoses:  Fall, initial encounter  Injury of head, initial encounter  Hip sprain, left, initial  encounter    Rx / DC Orders ED Discharge Orders     None         Eldon Greenland, MD 09/13/23 0210

## 2023-09-13 NOTE — ED Notes (Signed)
Report called to [facility] 

## 2023-09-13 NOTE — ED Triage Notes (Signed)
 Pt BIB EMS from [facility] with c/o fall out of a wheelchair. Normally wears 2lpm [place], O2 was empty upon ems arrival. Hx of stroke last month, left sided deficits. Takes plavix . No LOC. A&O   118/84

## 2023-09-13 NOTE — ED Notes (Signed)
 ..Trauma Response Nurse Documentation   [name] is a 78 y.o. male arriving to [facility] ED via EMS  On clopidogrel  75 mg daily. Trauma was activated as a Level 2 by charge nurse based on the following trauma criteria Elderly patients > 65 with head trauma on anti-coagulation (excluding ASA).  Patient cleared for CT by Dr. Wallis Gun. Pt transported to CT with trauma response nurse present to monitor. RN remained with the patient throughout their absence from the department for clinical observation.   GCS 15.  Trauma MD Arrival Time: N/A.  History   Past Medical History:  Diagnosis Date   AAA (abdominal aortic aneurysm) (HCC)    a.  CTA 7/19: measured 4.5 x 5.3 cm and greatest transverse dimensions   Abnormal nuclear stress test    a.  Myoview  2012: anterior wall ischemia with an estimated EF of 26%.  This was a new wall motion abnormality as well as a newly reduced EF   Brain bleed (HCC)    COPD (chronic obstructive pulmonary disease) (HCC)    Coronary artery disease    a.  Posterior MI in 2002 status post BMS to the LCx; b. LHC 2012: 95% stenosis of the proximal LAD, 95% stenosis of the mid LAD, 30% in-stent restenosis of the mid left circumflex with a second lesion of diffuse 50% stenosis.  The patient underwent successful PCI/BMS to the mid LAD with 0% residual stenosis, LV gram not performed   GERD (gastroesophageal reflux disease)    History of kidney stones    History of prostate cancer    Hyperlipidemia    Hypertension    Myocardial infarction [facility])    Neuromuscular disorder (HCC)    Prostate cancer (HCC)    a.  Status post seeding   PVD (peripheral vascular disease) (HCC)    Stroke [facility])      Past Surgical History:  Procedure Laterality Date   BACK SURGERY  2009/2010   x 2   BOWEL RESECTION N/A 03/18/2021   Procedure: SMALL BOWEL RESECTION;  Surgeon: Anda Bamberg, MD;  Location: [facility] OR;  Service: General;  Laterality: N/A;   CARDIAC CATHETERIZATION  2002    stent placement [place]   ENDOVASCULAR REPAIR/STENT GRAFT N/A 01/02/2018   Procedure: ENDOVASCULAR REPAIR/STENT GRAFT;  Surgeon: Celso College, MD;  Location: [facility] INVASIVE CV LAB;  Service: Cardiovascular;  Laterality: N/A;   FOOT SURGERY     bilateral    HERNIA REPAIR     bilateral    HIP ARTHROPLASTY Left 07/28/2023   Procedure: LEFT HIP HEMIARTHROPLASTY;  Surgeon: Marilee Showers, MD;  Location: [facility] OR;  Service: Orthopedics;  Laterality: Left;   INGUINAL HERNIA REPAIR Left 03/18/2021   Procedure: INGUINAL HERNIA REPAIR;  Surgeon: Anda Bamberg, MD;  Location: [facility] OR;  Service: General;  Laterality: Left;   INSERTION OF MESH Left 03/18/2021   Procedure: INSERTION OF MESH;  Surgeon: Anda Bamberg, MD;  Location: [facility] OR;  Service: General;  Laterality: Left;   KNEE SURGERY     right knee    PROSTATE SURGERY  09/2009   prostate implant   SPINE SURGERY     UMBILICAL HERNIA REPAIR N/A 03/18/2021   Procedure: UMBILICAL HERNIA REPAIR;  Surgeon: Anda Bamberg, MD;  Location: [facility] OR;  Service: General;  Laterality: N/A;       Initial Focused Assessment (If applicable, or please see trauma documentation):   CT's Completed:   CT Head   Interventions:  Plan for disposition:  Other Pending  Consults completed:  none   Event Summary: Pt arrived via [facility] from [facility] after fall from [facility] hitting head on floor. Pt c/o L hip pain, HA. Pt reports HA and hip/back pain were present before fall, recent arthoplasty. Moving all extremities, no swelling or external hemorrhage noted.  A & O, MD at bridge on arrival. Pt on 02 2-4L continuously.  Pt transported to CT after xray without issue. Pt reports his family has been notified of occurrence.  Dispo pending at this time.   MTP Summary (If applicable): N/A  Bedside handoff with ED RN Eva Hikes.    Alene Bergerson Dee  Trauma Response RN  Please call TRN at [phone] for further assistance.

## 2023-09-13 NOTE — ED Notes (Signed)
[facility] called for transport to [facility] 

## 2023-09-18 DIAGNOSIS — M6281 Muscle weakness (generalized): Secondary | ICD-10-CM | POA: Diagnosis not present

## 2023-09-18 DIAGNOSIS — J441 Chronic obstructive pulmonary disease with (acute) exacerbation: Secondary | ICD-10-CM | POA: Diagnosis not present

## 2023-09-18 DIAGNOSIS — I2510 Atherosclerotic heart disease of native coronary artery without angina pectoris: Secondary | ICD-10-CM | POA: Diagnosis not present

## 2023-09-18 DIAGNOSIS — I509 Heart failure, unspecified: Secondary | ICD-10-CM | POA: Diagnosis not present

## 2023-09-18 DIAGNOSIS — I10 Essential (primary) hypertension: Secondary | ICD-10-CM | POA: Diagnosis not present

## 2023-09-18 DIAGNOSIS — I6930 Unspecified sequelae of cerebral infarction: Secondary | ICD-10-CM | POA: Diagnosis not present

## 2023-09-20 ENCOUNTER — Emergency Department (HOSPITAL_COMMUNITY)

## 2023-09-20 ENCOUNTER — Other Ambulatory Visit: Payer: Self-pay

## 2023-09-20 ENCOUNTER — Emergency Department (HOSPITAL_COMMUNITY)
Admission: EM | Admit: 2023-09-20 | Discharge: 2023-09-20 | Disposition: A | Discharge status: Skilled Nursing Facility | Attending: Emergency Medicine | Admitting: Emergency Medicine

## 2023-09-20 DIAGNOSIS — J449 Chronic obstructive pulmonary disease, unspecified: Secondary | ICD-10-CM | POA: Diagnosis not present

## 2023-09-20 DIAGNOSIS — N200 Calculus of kidney: Secondary | ICD-10-CM | POA: Diagnosis not present

## 2023-09-20 DIAGNOSIS — J8410 Pulmonary fibrosis, unspecified: Secondary | ICD-10-CM | POA: Diagnosis not present

## 2023-09-20 DIAGNOSIS — I10 Essential (primary) hypertension: Secondary | ICD-10-CM | POA: Diagnosis not present

## 2023-09-20 DIAGNOSIS — F17210 Nicotine dependence, cigarettes, uncomplicated: Secondary | ICD-10-CM | POA: Insufficient documentation

## 2023-09-20 DIAGNOSIS — Z8546 Personal history of malignant neoplasm of prostate: Secondary | ICD-10-CM | POA: Insufficient documentation

## 2023-09-20 DIAGNOSIS — K5900 Constipation, unspecified: Secondary | ICD-10-CM | POA: Insufficient documentation

## 2023-09-20 DIAGNOSIS — R002 Palpitations: Secondary | ICD-10-CM | POA: Diagnosis not present

## 2023-09-20 DIAGNOSIS — R918 Other nonspecific abnormal finding of lung field: Secondary | ICD-10-CM | POA: Diagnosis not present

## 2023-09-20 DIAGNOSIS — J40 Bronchitis, not specified as acute or chronic: Secondary | ICD-10-CM | POA: Diagnosis not present

## 2023-09-20 DIAGNOSIS — Z96642 Presence of left artificial hip joint: Secondary | ICD-10-CM | POA: Diagnosis not present

## 2023-09-20 DIAGNOSIS — I2510 Atherosclerotic heart disease of native coronary artery without angina pectoris: Secondary | ICD-10-CM | POA: Insufficient documentation

## 2023-09-20 DIAGNOSIS — R109 Unspecified abdominal pain: Secondary | ICD-10-CM | POA: Diagnosis not present

## 2023-09-20 LAB — URINALYSIS, ROUTINE W REFLEX MICROSCOPIC
Bilirubin Urine: NEGATIVE
Glucose, UA: >=500 mg/dL — AB
Ketones, ur: NEGATIVE mg/dL
Leukocytes,Ua: NEGATIVE
Nitrite: NEGATIVE
Protein, ur: NEGATIVE mg/dL
Specific Gravity, Urine: 1.01 (ref 1.005–1.030)
pH: 7 (ref 5.0–8.0)

## 2023-09-20 LAB — LIPASE, BLOOD: Lipase: 24 U/L (ref 11–51)

## 2023-09-20 LAB — MAGNESIUM: Magnesium: 1.9 mg/dL (ref 1.7–2.4)

## 2023-09-20 LAB — CBC
HCT: 37.4 % — ABNORMAL LOW (ref 39.0–52.0)
Hemoglobin: 12.1 g/dL — ABNORMAL LOW (ref 13.0–17.0)
MCH: 31.6 pg (ref 26.0–34.0)
MCHC: 32.4 g/dL (ref 30.0–36.0)
MCV: 97.7 fL (ref 80.0–100.0)
Platelets: 201 10*3/uL (ref 150–400)
RBC: 3.83 MIL/uL — ABNORMAL LOW (ref 4.22–5.81)
RDW: 14.3 % (ref 11.5–15.5)
WBC: 7.6 10*3/uL (ref 4.0–10.5)
nRBC: 0 % (ref 0.0–0.2)

## 2023-09-20 LAB — COMPREHENSIVE METABOLIC PANEL WITH GFR
ALT: 14 U/L (ref 0–44)
AST: 17 U/L (ref 15–41)
Albumin: 2.7 g/dL — ABNORMAL LOW (ref 3.5–5.0)
Alkaline Phosphatase: 67 U/L (ref 38–126)
Anion gap: 8 (ref 5–15)
BUN: 15 mg/dL (ref 8–23)
CO2: 27 mmol/L (ref 22–32)
Calcium: 8.6 mg/dL — ABNORMAL LOW (ref 8.9–10.3)
Chloride: 105 mmol/L (ref 98–111)
Creatinine, Ser: 1.02 mg/dL (ref 0.61–1.24)
GFR, Estimated: >60 mL/min (ref >60)
Glucose, Bld: 105 mg/dL — ABNORMAL HIGH (ref 70–99)
Potassium: 3.8 mmol/L (ref 3.5–5.1)
Sodium: 140 mmol/L (ref 135–145)
Total Bilirubin: 0.8 mg/dL (ref 0.0–1.2)
Total Protein: 5.7 g/dL — ABNORMAL LOW (ref 6.5–8.1)

## 2023-09-20 LAB — URINALYSIS, MICROSCOPIC (REFLEX): Bacteria, UA: NONE SEEN

## 2023-09-20 MED ORDER — FENTANYL CITRATE PF 50 MCG/ML IJ SOSY
50.0000 ug | PREFILLED_SYRINGE | Freq: Once | INTRAMUSCULAR | Status: AC
  Administered 2023-09-20: 50 ug via INTRAVENOUS
  Filled 2023-09-20: qty 1

## 2023-09-20 MED ORDER — POLYETHYLENE GLYCOL 3350 17 G PO PACK: 17.0000 g | PACK | Freq: Every day | ORAL | 1 refills | Status: DC

## 2023-09-20 MED ORDER — IOHEXOL 350 MG/ML SOLN
75.0000 mL | Freq: Once | INTRAVENOUS | Status: AC | PRN
Start: 2023-09-20 — End: 2023-09-20
  Administered 2023-09-20: 75 mL via INTRAVENOUS

## 2023-09-20 NOTE — ED Notes (Signed)
 Attempted to call facility for report x1 no answer

## 2023-09-20 NOTE — ED Notes (Signed)
 Patient transported to CT

## 2023-09-20 NOTE — ED Notes (Signed)
 Verbal handoff given to RN Tiffany D. Pt moved to room 3. Pending report to facility - 1 attempt made. [facility] transport organized by [employer] operational officer.

## 2023-09-20 NOTE — Discharge Instructions (Addendum)
 You were evaluated in the Emergency Department and after careful evaluation, we did not find any emergent condition requiring admission or further testing in the hospital.  Your exam/testing today is overall reassuring.  Recommend use of the MiraLAX  to help with your constipation, follow-up closely with your regular doctors.  Please return to the Emergency Department if you experience any worsening of your condition.   Thank you for allowing us  to be a part of your care.

## 2023-09-20 NOTE — ED Provider Notes (Signed)
 [facility] Emergency Department Provider Note MRN:  [id number]  Arrival date & time: 09/20/23     Chief Complaint   Abdominal pain History of Present Illness   [name] is a 78 y.o. year-old male with a history of AAA, COPD, PVD, stroke presenting to the ED with chief complaint of abdominal pain.  Abdominal pain for 3 weeks, constipation for 3 days, complaining of chronic left hip pain.  Denies chest pain, no headache, no nausea vomiting or diarrhea.  Review of Systems  A thorough review of systems was obtained and all systems are negative except as noted in the HPI and PMH.   Patient's Health History    Past Medical History:  Diagnosis Date   AAA (abdominal aortic aneurysm) (HCC)    a.  CTA 7/19: measured 4.5 x 5.3 cm and greatest transverse dimensions   Abnormal nuclear stress test    a.  Myoview  2012: anterior wall ischemia with an estimated EF of 26%.  This was a new wall motion abnormality as well as a newly reduced EF   Brain bleed (HCC)    COPD (chronic obstructive pulmonary disease) (HCC)    Coronary artery disease    a.  Posterior MI in 2002 status post BMS to the LCx; b. LHC 2012: 95% stenosis of the proximal LAD, 95% stenosis of the mid LAD, 30% in-stent restenosis of the mid left circumflex with a second lesion of diffuse 50% stenosis.  The patient underwent successful PCI/BMS to the mid LAD with 0% residual stenosis, LV gram not performed   GERD (gastroesophageal reflux disease)    History of kidney stones    History of prostate cancer    Hyperlipidemia    Hypertension    Myocardial infarction [facility])    Neuromuscular disorder (HCC)    Prostate cancer (HCC)    a.  Status post seeding   PVD (peripheral vascular disease) (HCC)    Stroke [facility])     Past Surgical History:  Procedure Laterality Date   BACK SURGERY  2009/2010   x 2   BOWEL RESECTION N/A 03/18/2021   Procedure: SMALL BOWEL RESECTION;  Surgeon: Anda Bamberg, MD;   Location: [facility] OR;  Service: General;  Laterality: N/A;   CARDIAC CATHETERIZATION  2002   stent placement [place]   ENDOVASCULAR REPAIR/STENT GRAFT N/A 01/02/2018   Procedure: ENDOVASCULAR REPAIR/STENT GRAFT;  Surgeon: Celso College, MD;  Location: [facility] INVASIVE CV LAB;  Service: Cardiovascular;  Laterality: N/A;   FOOT SURGERY     bilateral    HERNIA REPAIR     bilateral    HIP ARTHROPLASTY Left 07/28/2023   Procedure: LEFT HIP HEMIARTHROPLASTY;  Surgeon: Marilee Showers, MD;  Location: [facility] OR;  Service: Orthopedics;  Laterality: Left;   INGUINAL HERNIA REPAIR Left 03/18/2021   Procedure: INGUINAL HERNIA REPAIR;  Surgeon: Anda Bamberg, MD;  Location: [facility] OR;  Service: General;  Laterality: Left;   INSERTION OF MESH Left 03/18/2021   Procedure: INSERTION OF MESH;  Surgeon: Anda Bamberg, MD;  Location: [facility] OR;  Service: General;  Laterality: Left;   KNEE SURGERY     right knee    PROSTATE SURGERY  09/2009   prostate implant   SPINE SURGERY     UMBILICAL HERNIA REPAIR N/A 03/18/2021   Procedure: UMBILICAL HERNIA REPAIR;  Surgeon: Anda Bamberg, MD;  Location: [facility] OR;  Service: General;  Laterality: N/A;    Family History  Problem Relation Age of  Onset   Heart attack Mother    Diabetes Mother    Heart disease Father    Lung cancer Daughter     Social History   Socioeconomic History   Marital status: Married    Spouse name: Not on file   Number of children: Not on file   Years of education: Not on file   Highest education level: Not on file  Occupational History   Not on file  Tobacco Use   Smoking status: Every Day    Current packs/day: 0.25    Average packs/day: 0.3 packs/day for 56.0 years (14.0 ttl pk-yrs)    Types: Cigarettes   Smokeless tobacco: Never   Tobacco comments:    back to smoking X1 year  Vaping Use   Vaping status: Never Used  Substance and Sexual Activity   Alcohol use: No    Alcohol/week: 0.0 standard drinks of alcohol   Drug use: No   Sexual  activity: Not Currently  Other Topics Concern   Not on file  Social History Narrative   Not on file   Social Drivers of Health   Financial Resource Strain: Low Risk  (01/03/2023)   Received from [facility]   Overall Financial Resource Strain (CARDIA)    Difficulty of Paying Living Expenses: Not hard at all  Food Insecurity: No Food Insecurity (08/08/2023)   Hunger Vital Sign    Worried About Running Out of Food in the Last Year: Never true    Ran Out of Food in the Last Year: Never true  Transportation Needs: No Transportation Needs (08/08/2023)   PRAPARE - Administrator, Civil Service (Medical): No    Lack of Transportation (Non-Medical): No  Physical Activity: Not on file  Stress: No Stress Concern Present (08/21/2018)   [employer] of Occupational Health - Occupational Stress Questionnaire    Feeling of Stress : Not at all  Social Connections: Moderately Isolated (08/08/2023)   Social Connection and Isolation Panel [NHANES]    Frequency of Communication with Friends and Family: Three times a week    Frequency of Social Gatherings with Friends and Family: Three times a week    Attends Religious Services: More than 4 times per year    Active Member of Clubs or Organizations: No    Attends Banker Meetings: Never    Marital Status: Widowed  Intimate Partner Violence: Not At Risk (08/08/2023)   Humiliation, Afraid, Rape, and Kick questionnaire    Fear of Current or Ex-Partner: No    Emotionally Abused: No    Physically Abused: No    Sexually Abused: No     Physical Exam   Vitals:   09/20/23 0445 09/20/23 0500  BP: (!) 187/89 (!) 168/74  Pulse: (!) 43   Resp: (!) 23 20  Temp:    SpO2: 93% 97%    CONSTITUTIONAL: Chronically ill-appearing, NAD NEURO/PSYCH:  Alert and oriented x 3, no focal deficits EYES:  eyes equal and reactive ENT/NECK:  no LAD, no JVD CARDIO: Bradycardic rate, well-perfused, normal S1 and S2 PULM:  CTAB  no wheezing or rhonchi GI/GU:  non-distended, non-tender MSK/SPINE:  No gross deformities, no edema SKIN:  no rash, atraumatic   *Additional and/or pertinent findings included in MDM below  Diagnostic and Interventional Summary    EKG Interpretation Date/Time:  Thursday September 20 2023 01:09:48 EDT Ventricular Rate:  54 PR Interval:  191 QRS Duration:  116 QT Interval:  442 QTC Calculation: 419  R Axis:   -23  Text Interpretation: Sinus rhythm Ventricular trigeminy Nonspecific intraventricular conduction delay Inferior infarct, old Lateral leads are also involved Confirmed by Gwenetta Lennert [phone]) on 09/20/2023 1:32:23 AM       Labs Reviewed  CBC - Abnormal; Notable for the following components:      Result Value   RBC 3.83 (*)    Hemoglobin 12.1 (*)    HCT 37.4 (*)    All other components within normal limits  COMPREHENSIVE METABOLIC PANEL WITH GFR - Abnormal; Notable for the following components:   Glucose, Bld 105 (*)    Calcium  8.6 (*)    Total Protein 5.7 (*)    Albumin 2.7 (*)    All other components within normal limits  URINALYSIS, ROUTINE W REFLEX MICROSCOPIC - Abnormal; Notable for the following components:   Glucose, UA >=500 (*)    Hgb urine dipstick LARGE (*)    All other components within normal limits  MAGNESIUM   LIPASE, BLOOD  URINALYSIS, MICROSCOPIC (REFLEX)    CT ABDOMEN PELVIS W CONTRAST  Final Result    DG Chest Port 1 View  Final Result      Medications  fentaNYL  (SUBLIMAZE ) injection 50 mcg (50 mcg Intravenous Given 09/20/23 0119)  iohexol  (OMNIPAQUE ) 350 MG/ML injection 75 mL (75 mLs Intravenous Contrast Given 09/20/23 0230)     Procedures  /  Critical Care Procedures  ED Course and Medical Decision Making  Initial Impression and Ddx Differential diagnosis includes constipation, SBO, incarcerated hernia which patient has a documented history of, worsening or symptomatic AAA.  Hemodynamically seems to be doing well.  Past medical/surgical  history that increases complexity of ED encounter: AAA  Interpretation of Diagnostics I personally reviewed the EKG and my interpretation is as follows: Trigeminy  No significant blood count or electrolyte disturbance.  Patient Reassessment and Ultimate Disposition/Management     Patient has been comfortable appearing for his 4 hours here in the emergency department, workup is overall reassuring.  CT without acute explanation for patient's pain per radiology.  I spoke with radiologist regarding the CT read, commentary on PACU versus small aortic dissection in the thoracic aorta, was present 2 years ago, slightly larger compared to that time but nothing to suggest that this is an acute issue causing any discomfort.  No indication for further testing or admission.  Appropriate for discharge.  Patient management required discussion with the following services or consulting groups:  None  Complexity of Problems Addressed Acute illness or injury that poses threat of life of bodily function  Additional Data Reviewed and Analyzed Further history obtained from: EMS on arrival  Additional Factors Impacting ED Encounter Risk Consideration of hospitalization  Merrick Abe. Harless Lien, MD [facility] Emergency Medicine [facility] [email or web address]  Final Clinical Impressions(s) / ED Diagnoses     ICD-10-CM   1. Constipation, unspecified constipation type  K59.00       ED Discharge Orders          Ordered    polyethylene glycol (MIRALAX ) 17 g packet  Daily        09/20/23 0520             Discharge Instructions Discussed with and Provided to Patient:     Discharge Instructions      You were evaluated in the Emergency Department and after careful evaluation, we did not find any emergent condition requiring admission or further testing in the hospital.  Your exam/testing today is overall reassuring.  Recommend use of the MiraLAX  to help with your constipation,  follow-up closely with your regular doctors.  Please return to the Emergency Department if you experience any worsening of your condition.   Thank you for allowing us  to be a part of your care.     Edson Graces, MD 09/20/23 [phone]

## 2023-09-20 NOTE — ED Notes (Signed)
 Assisted pt to use urinal without success. Pt sts he is unable to void. POC orders to cath pt discussed to pt. Pt sts that he has had a cath before and its been painful. Requests time to attempt to void and water . Pt provided with water . Urinal at bedside.

## 2023-09-20 NOTE — ED Notes (Signed)
 Secretary Garnet Just requested to coordinate transport back to SNF via [employer]

## 2023-09-20 NOTE — ED Triage Notes (Signed)
 Pt BIB [facility] from [facility]. Staff reports Abdominal pain x3 weeks with constipation x 3 days. No NV. EMS noted A Fib on cardiac monitor.

## 2023-09-20 NOTE — ED Notes (Signed)
 Pt had a rhythm change from a fib to vent bigeminy. EDP Bero informed. No additional orders

## 2023-09-20 NOTE — ED Notes (Signed)
 CCMD informed of transfer to room 3

## 2023-09-20 NOTE — ED Notes (Signed)
 Patient discharged by previous RN, PTAR to [facility]

## 2023-10-04 DIAGNOSIS — I5020 Unspecified systolic (congestive) heart failure: Secondary | ICD-10-CM | POA: Diagnosis not present

## 2023-10-04 DIAGNOSIS — I639 Cerebral infarction, unspecified: Secondary | ICD-10-CM | POA: Diagnosis not present

## 2023-10-04 DIAGNOSIS — I110 Hypertensive heart disease with heart failure: Secondary | ICD-10-CM | POA: Diagnosis not present

## 2023-10-04 DIAGNOSIS — Z9981 Dependence on supplemental oxygen: Secondary | ICD-10-CM | POA: Diagnosis not present

## 2023-10-04 DIAGNOSIS — J449 Chronic obstructive pulmonary disease, unspecified: Secondary | ICD-10-CM | POA: Diagnosis not present

## 2023-10-17 DIAGNOSIS — L602 Onychogryphosis: Secondary | ICD-10-CM | POA: Diagnosis not present

## 2023-10-17 DIAGNOSIS — I739 Peripheral vascular disease, unspecified: Secondary | ICD-10-CM | POA: Diagnosis not present

## 2023-10-17 DIAGNOSIS — L84 Corns and callosities: Secondary | ICD-10-CM | POA: Diagnosis not present

## 2023-10-17 DIAGNOSIS — L603 Nail dystrophy: Secondary | ICD-10-CM | POA: Diagnosis not present

## 2023-10-24 DIAGNOSIS — J449 Chronic obstructive pulmonary disease, unspecified: Secondary | ICD-10-CM | POA: Diagnosis not present

## 2023-10-24 DIAGNOSIS — Z9981 Dependence on supplemental oxygen: Secondary | ICD-10-CM | POA: Diagnosis not present

## 2023-10-24 DIAGNOSIS — I110 Hypertensive heart disease with heart failure: Secondary | ICD-10-CM | POA: Diagnosis not present

## 2023-10-24 DIAGNOSIS — I639 Cerebral infarction, unspecified: Secondary | ICD-10-CM | POA: Diagnosis not present

## 2023-10-24 DIAGNOSIS — I5020 Unspecified systolic (congestive) heart failure: Secondary | ICD-10-CM | POA: Diagnosis not present

## 2023-11-02 DIAGNOSIS — I110 Hypertensive heart disease with heart failure: Secondary | ICD-10-CM | POA: Diagnosis not present

## 2023-11-02 DIAGNOSIS — I5022 Chronic systolic (congestive) heart failure: Secondary | ICD-10-CM | POA: Diagnosis not present

## 2023-11-02 DIAGNOSIS — Z9981 Dependence on supplemental oxygen: Secondary | ICD-10-CM | POA: Diagnosis not present

## 2023-11-02 DIAGNOSIS — J449 Chronic obstructive pulmonary disease, unspecified: Secondary | ICD-10-CM | POA: Diagnosis not present

## 2023-11-14 DIAGNOSIS — M79642 Pain in left hand: Secondary | ICD-10-CM | POA: Diagnosis not present

## 2023-11-14 DIAGNOSIS — M25532 Pain in left wrist: Secondary | ICD-10-CM | POA: Diagnosis not present

## 2023-11-29 DIAGNOSIS — J440 Chronic obstructive pulmonary disease with acute lower respiratory infection: Secondary | ICD-10-CM | POA: Diagnosis not present

## 2023-11-29 DIAGNOSIS — I10 Essential (primary) hypertension: Secondary | ICD-10-CM | POA: Diagnosis not present

## 2023-11-29 DIAGNOSIS — J9611 Chronic respiratory failure with hypoxia: Secondary | ICD-10-CM | POA: Diagnosis not present

## 2023-11-29 DIAGNOSIS — Z9981 Dependence on supplemental oxygen: Secondary | ICD-10-CM | POA: Diagnosis not present

## 2023-11-29 DIAGNOSIS — I5020 Unspecified systolic (congestive) heart failure: Secondary | ICD-10-CM | POA: Diagnosis not present

## 2023-11-29 DIAGNOSIS — Z7982 Long term (current) use of aspirin: Secondary | ICD-10-CM | POA: Diagnosis not present

## 2023-12-27 DIAGNOSIS — I5020 Unspecified systolic (congestive) heart failure: Secondary | ICD-10-CM | POA: Diagnosis not present

## 2023-12-27 DIAGNOSIS — Z9981 Dependence on supplemental oxygen: Secondary | ICD-10-CM | POA: Diagnosis not present

## 2023-12-27 DIAGNOSIS — I10 Essential (primary) hypertension: Secondary | ICD-10-CM | POA: Diagnosis not present

## 2023-12-27 DIAGNOSIS — J440 Chronic obstructive pulmonary disease with acute lower respiratory infection: Secondary | ICD-10-CM | POA: Diagnosis not present

## 2023-12-27 DIAGNOSIS — J9611 Chronic respiratory failure with hypoxia: Secondary | ICD-10-CM | POA: Diagnosis not present

## 2023-12-31 DIAGNOSIS — I10 Essential (primary) hypertension: Secondary | ICD-10-CM | POA: Diagnosis not present

## 2023-12-31 DIAGNOSIS — E559 Vitamin D deficiency, unspecified: Secondary | ICD-10-CM | POA: Diagnosis not present

## 2024-01-02 ENCOUNTER — Telehealth: Payer: Self-pay | Admitting: Family

## 2024-01-02 DIAGNOSIS — E559 Vitamin D deficiency, unspecified: Secondary | ICD-10-CM | POA: Diagnosis not present

## 2024-01-02 DIAGNOSIS — J449 Chronic obstructive pulmonary disease, unspecified: Secondary | ICD-10-CM | POA: Diagnosis not present

## 2024-01-02 DIAGNOSIS — D696 Thrombocytopenia, unspecified: Secondary | ICD-10-CM | POA: Diagnosis not present

## 2024-01-02 DIAGNOSIS — E119 Type 2 diabetes mellitus without complications: Secondary | ICD-10-CM | POA: Diagnosis not present

## 2024-01-02 DIAGNOSIS — J9601 Acute respiratory failure with hypoxia: Secondary | ICD-10-CM | POA: Diagnosis not present

## 2024-01-02 NOTE — Telephone Encounter (Signed)
 Called to confirm/remind patient of their appointment at the [facility] on 01/03/24.   Appointment:   [x] Confirmed  [] Left mess   [] No answer/No voice mail  [] VM Full/unable to leave message  [] Phone not in service  Patient reminded to bring all medications and/or complete list.  Confirmed patient has transportation. Gave directions, instructed to utilize valet parking.

## 2024-01-02 NOTE — Progress Notes (Deleted)
 Advanced [facility] Note    PCP: Cleotilde Oneil FALCON, MD (last seen 09/24) Cardiologist: Timothy Gollan, MD / Abigail Motto, PA (last seen 12/24; returns 06/25)  Chief Complaint: shortness of breath with exertion  HPI:  Mr [name] is a 78 y/o male with a history of severe COPD, anxiety, stroke, neuromuscular disorder,depression, HTN, prostate cancer s/p seed implantation, hyperlipidemia, lumbar disc disease, CAD with history of posterior MI in 2002 status post BMS to the LCx status post PCI/BMS to the LAD in 2012, AAA status post endovascular repair in 12/2017 followed by vascular surgery, intraparenchymal hemorrhage in 02/2021, pulmonary HTN, nephrolithiasis, GERD, tobacco use and chronic ischemic heart failure.   He underwent Myoview  in 2010 that was negative for ischemia with an EF of 60%. In 2012, nuclear stress testing showed anterior wall ischemia with an estimated EF of 26%. Follow-up cardiac cath on 01/05/2011 showed 95% stenosis of the proximal LAD, 95% stenosis of the mid LAD, 30% in-stent restenosis of the mid left circumflex with a second lesion of diffuse 50% stenosis.  The patient underwent successful PCI/BMS to the mid LAD with 0% residual stenosis   Admitted to the hospital in 04/2021 with acute on chronic combined CHF and elevated troponin peaking at 448, felt to be related to demand ischemia.  Was improved with IV diuresis. Echo in 04/2021 demonstrating an EF of 30 to 35% with global hypokinesis, mild, grade 1 diastolic dysfunction, normal RV systolic function with mildly enlarged cavity size, PASP 54.5 mmHg, mildly dilated left atrium, severely dilated right atrium and estimated right atrial pressure of 8 mmHg.   Admitted 12/12/22 due to new onset of productive cough, shortness of breath and wheezing for 3 days prior to presentation. In the ED patient was hypoxic with pulse ox of 80% on room air. Chest x-ray showed pulmonary vascular congestion. Given IV lasix . Echocardiogram  this admission shows LV ejection fraction of 40 to 45% with global hypokinesis. Improvement in ejection fraction compared to echo from 04/2021. Qualified for home oxygen .    Seen in AHF clinic 9/324, his BP in his left arm read with systolics in the 50s. BP on the right with systolic in the 80s. His furosemide  was held and GDMT decreased.     AHF clinic 12/21/22 labs showed possible over diuresis and SBP was soft. Symptoms suspected to be more due his advanced COPD that HF, switched lasix  to PRN.    Echo 12/12/22: EF 40-45% with moderate LVH, Grade I DD, normal PA Pressure with mild MR   Admitted 01/16/23 with HF flare. Diuresed with IV lasix . Farxiga  added. No discharge weight recorded.   He presents today for a HF follow-up visit with a chief complaint of shortness of breath with exertion which is chronic in nature. Has associated fatigue, cough, feeling of being off balance at times and chronic difficulty sleeping along with this. Denies chest pain, palpitations, abdominal distention, pedal edema, dizziness or weight gain. Wearing oxygen  at 4L around the clock. Weight stable at home  He says that he's having trouble sleeping at night and tends to nap or doze off during the day because he's so tired. Denies that his difficulty sleeping has to do with his shortness of breath. Has upcoming PCP appt next week and will discuss this with him further. Had lab work done this morning for that upcoming appointment.   Previous cardiac studies:  Echo 12/25/17: EF 30-35% grade 1 diastolic dysfunction, mildly dilated aortic root at 39 mm, mild  mitral regurgitation, mildly dilated left atrium and normal pulmonary arterial pressure. Echo 10/25/18: EF 30-35% with mild LVH Echo 06/24/20: EF 30-35% with Grade 1 DD Echo 02/21/21: EF 40-45% with mild LVH, Grade I DD Echo 04/25/21: EF 30-35% with Grade I DD, mild LVH, moderately elevated PA pressure of 54.5 mmHg, mild MR Echo 12/12/22: EF 40-45% with moderate LVH, Grade I DD,  normal PA Pressure with mild MR  LHC 01/05/11: 95% stenosis of the proximal LAD, 95% stenosis of the mid LAD, 30% in-stent restenosis of the mid left circumflex with a second lesion of diffuse 50% stenosis. Underwent successful PCI/BMS to the mid LAD with 0% residual stenosis .   ROS: All systems negative except as listed in HPI, PMH and Problem List.  SH:  Social History   Socioeconomic History   Marital status: Married    Spouse name: Not on file   Number of children: Not on file   Years of education: Not on file   Highest education level: Not on file  Occupational History   Not on file  Tobacco Use   Smoking status: Every Day    Current packs/day: 0.25    Average packs/day: 0.3 packs/day for 56.0 years (14.0 ttl pk-yrs)    Types: Cigarettes   Smokeless tobacco: Never   Tobacco comments:    back to smoking X1 year  Vaping Use   Vaping status: Never Used  Substance and Sexual Activity   Alcohol use: No    Alcohol/week: 0.0 standard drinks of alcohol   Drug use: No   Sexual activity: Not Currently  Other Topics Concern   Not on file  Social History Narrative   Not on file   Social Drivers of Health   Financial Resource Strain: Low Risk  (01/03/2023)   Received from [facility]   Overall Financial Resource Strain (CARDIA)    Difficulty of Paying Living Expenses: Not hard at all  Food Insecurity: No Food Insecurity (08/08/2023)   Hunger Vital Sign    Worried About Running Out of Food in the Last Year: Never true    Ran Out of Food in the Last Year: Never true  Transportation Needs: No Transportation Needs (08/08/2023)   PRAPARE - Administrator, Civil Service (Medical): No    Lack of Transportation (Non-Medical): No  Physical Activity: Not on file  Stress: No Stress Concern Present (08/21/2018)   [employer] of Occupational Health - Occupational Stress Questionnaire    Feeling of Stress : Not at all  Social Connections: Moderately  Isolated (08/08/2023)   Social Connection and Isolation Panel    Frequency of Communication with Friends and Family: Three times a week    Frequency of Social Gatherings with Friends and Family: Three times a week    Attends Religious Services: More than 4 times per year    Active Member of Clubs or Organizations: No    Attends Banker Meetings: Never    Marital Status: Widowed  Intimate Partner Violence: Not At Risk (08/08/2023)   Humiliation, Afraid, Rape, and Kick questionnaire    Fear of Current or Ex-Partner: No    Emotionally Abused: No    Physically Abused: No    Sexually Abused: No    FH:  Family History  Problem Relation Age of Onset   Heart attack Mother    Diabetes Mother    Heart disease Father    Lung cancer Daughter     Past Medical History:  Diagnosis Date   AAA (abdominal aortic aneurysm) (HCC)    a.  CTA 7/19: measured 4.5 x 5.3 cm and greatest transverse dimensions   Abnormal nuclear stress test    a.  Myoview  2012: anterior wall ischemia with an estimated EF of 26%.  This was a new wall motion abnormality as well as a newly reduced EF   Brain bleed (HCC)    COPD (chronic obstructive pulmonary disease) (HCC)    Coronary artery disease    a.  Posterior MI in 2002 status post BMS to the LCx; b. LHC 2012: 95% stenosis of the proximal LAD, 95% stenosis of the mid LAD, 30% in-stent restenosis of the mid left circumflex with a second lesion of diffuse 50% stenosis.  The patient underwent successful PCI/BMS to the mid LAD with 0% residual stenosis, LV gram not performed   GERD (gastroesophageal reflux disease)    History of kidney stones    History of prostate cancer    Hyperlipidemia    Hypertension    Myocardial infarction [facility])    Neuromuscular disorder (HCC)    Prostate cancer (HCC)    a.  Status post seeding   PVD (peripheral vascular disease) (HCC)    Stroke [facility])     Current Outpatient Medications  Medication Sig Dispense Refill    acetaminophen  (TYLENOL ) 325 MG tablet Take 2 tablets (650 mg total) by mouth every 6 (six) hours as needed for mild pain (or Fever >/= 101).     albuterol  (PROVENTIL ) (2.5 MG/3ML) 0.083% nebulizer solution Take 2.5 mg by nebulization every 6 (six) hours as needed.     albuterol  (VENTOLIN  HFA) 108 (90 Base) MCG/ACT inhaler Inhale 2 puffs into the lungs every 6 (six) hours as needed for wheezing. 1 each 1   aspirin  EC 81 MG tablet Take 1 tablet (81 mg total) by mouth daily.     atorvastatin  (LIPITOR ) 80 MG tablet Take 1 tablet (80 mg total) by mouth daily. 90 tablet 3   clopidogrel  (PLAVIX ) 75 MG tablet Take 1 tablet (75 mg total) by mouth daily.     dapagliflozin  propanediol (FARXIGA ) 10 MG TABS tablet Take 1 tablet (10 mg total) by mouth daily. 90 tablet 3   ezetimibe  (ZETIA ) 10 MG tablet Take 1 tablet (10 mg total) by mouth daily.     furosemide  (LASIX ) 20 MG tablet Take 20 mg by mouth See admin instructions. Take one tablet by mouth on Sunday, Tuesday, Wednesday, Thursday, and Saturday.     furosemide  (LASIX ) 40 MG tablet Take 40 mg by mouth 2 (two) times a week. Take one tablet my mouth on Monday and Friday.     ipratropium-albuterol  (DUONEB) 0.5-2.5 (3) MG/3ML SOLN Use twice a day scheduled and every 4 hours as needed for shortness of breath and wheezing (Patient taking differently: Inhale 3 mLs into the lungs 2 (two) times daily.) 360 mL 0   ipratropium-albuterol  (DUONEB) 0.5-2.5 (3) MG/3ML SOLN Inhale 3 mLs into the lungs every 4 (four) hours as needed (Shortness of breath or wheezing).     midodrine  (PROAMATINE ) 5 MG tablet Take 1 tablet (5 mg total) by mouth 2 (two) times daily with a meal.     Multiple Vitamins-Minerals (MULTIVITAMIN WITH MINERALS) tablet Take 1 tablet by mouth daily.     omeprazole  (PRILOSEC) 40 MG capsule TAKE 1 CAPSULE BY MOUTH DAILY USUALLY BEFORE BREAKFAST 30 capsule 0   polyethylene glycol (MIRALAX ) 17 g packet Take 17 g by mouth daily. 30 each 1  spironolactone  (ALDACTONE ) 25 MG tablet Take 0.5 tablets (12.5 mg total) by mouth daily. FOR CHRONIC COMBINED CHF 45 tablet 3   tiotropium (SPIRIVA  HANDIHALER) 18 MCG inhalation capsule Place 1 capsule (18 mcg total) into inhaler and inhale daily. 30 capsule 0   No current facility-administered medications for this visit.   There were no vitals filed for this visit.  Wt Readings from Last 3 Encounters:  09/13/23 164 lb 0.4 oz (74.4 kg)  08/08/23 164 lb 0.4 oz (74.4 kg)  07/28/23 168 lb 10.4 oz (76.5 kg)   Lab Results  Component Value Date   CREATININE 1.02 09/20/2023   CREATININE 1.16 08/08/2023   CREATININE 1.50 (H) 08/05/2023    PHYSICAL EXAM:  General: elderly appearing in wheelchair. No resp difficulty HEENT: normal Neck: supple, no JVD Cor: Regular rhythm, bradycardic. No rubs, gallops or murmurs Lungs: clear although diminished in bases Abdomen: soft, nontender, nondistended. Extremities: no cyanosis, clubbing, rash, edema Neuro: alert & oriented X 3. Moves all 4 extremities w/o difficulty. Affect pleasant  ECG: not done   ASSESSMENT & PLAN:  1: Ischemic heart failure with mildly reduced ejection fraction- - due to CAD with stent 2012 - NYHA class III; main limitation likely deconditioning and COPD - euvolemic - weighing daily  - weight down 10 pounds from last visit here 3 months ago - Echo 10/25/18: EF 30-35% with mild LVH - Echo 06/24/20: EF 30-35% with Grade 1 DD - Echo 02/21/21: EF 40-45% with mild LVH, Grade I DD - Echo 04/25/21: EF 30-35% with Grade I DD, mild LVH, moderately elevated PA pressure of 54.5 mmHg, mild MR - Echo 12/12/22: EF 40-45% with moderate LVH, Grade I DD, normal PA Pressure with mild MR - not adding salt to his food - continue farxiga  10mg  daily - continue furosemide  40mg  M & F and 20mg  the other days of the week - continue entresto  24/26mg  BID - continue spironolactone  12.5mg  daily - had bisoprolol  stopped due to bradycardia/  hypotension - would titrate CHF meds slowly if able, as he previously didn't tolerated higher doses 2/2 significant hypotension. No change today  - BNP 01/23/23 was 663.3  2: HTN- - BP 128/51 - saw PCP Ted) 09/24 - BMP 04/13/23 reviewed and showed sodium 145, potassium 4.0, creatinine 1.12 and GFR 67 - reports having lab work drawn earlier today for upcoming PCP appt next week  3: CAD- - saw cardiology Timoteo) 12/24 - continue ASA 81mg  daily - continue atorvastatin  80mg  daily - LDL 12/29/22 was 67 - no s/s angina - LHC 01/05/11: 95% stenosis of the proximal LAD, 95% stenosis of the mid LAD, 30% in-stent restenosis of the mid left circumflex with a second lesion of diffuse 50% stenosis. Underwent successful PCI/BMS to the mid LAD with 0% residual stenosis .   4: COPD- - wearing oxygen  at 4L around the clock - continues to use inhalers - continues to smoke but takes his oxygen  off and doesn't smoke anywhere near the oxygen   5: Frequent PVC's/ PAC's- - keep K >4.0 - keep Mg >2.0   Return in 6 months, sooner if needed.   Ellouise Class, FNP

## 2024-01-03 ENCOUNTER — Encounter: Admitting: Family

## 2024-01-09 ENCOUNTER — Telehealth: Payer: Self-pay | Admitting: Family

## 2024-01-09 NOTE — Telephone Encounter (Signed)
 Called to confirm/remind patient of their appointment at the [facility] on 01/10/24.   Appointment:   [x] Confirmed  [] Left mess   [] No answer/No voice mail  [] VM Full/unable to leave message  [] Phone not in service  Patient reminded to bring all medications and/or complete list.  Confirmed patient has transportation. Gave directions, instructed to utilize valet parking.

## 2024-01-10 ENCOUNTER — Encounter: Admitting: Family

## 2024-01-10 ENCOUNTER — Telehealth: Payer: Self-pay | Admitting: Family

## 2024-01-10 NOTE — Progress Notes (Deleted)
 [facility] Note    PCP: Cleotilde Oneil FALCON, MD Cardiologist: Evalene Lunger, MD / Abigail Motto, [place]   Chief Complaint:    HPI:  [name] is a 78 y/o male with a history of severe COPD, anxiety, stroke, neuromuscular disorder,depression, HTN, prostate cancer s/p seed implantation, hyperlipidemia, lumbar disc disease, CAD with history of posterior MI in 2002 status post BMS to the LCx status post PCI/BMS to the LAD in 2012, AAA status post endovascular repair in 12/2017 followed by vascular surgery, intraparenchymal hemorrhage in 02/2021, pulmonary HTN, nephrolithiasis, GERD, tobacco use and chronic ischemic heart failure.   He underwent Myoview  in 2010 that was negative for ischemia with an EF of 60%. In 2012, nuclear stress testing showed anterior wall ischemia with an estimated EF of 26%. Follow-up cardiac cath on 01/05/2011 showed 95% stenosis of the proximal LAD, 95% stenosis of the mid LAD, 30% in-stent restenosis of the mid left circumflex with a second lesion of diffuse 50% stenosis.  The patient underwent successful PCI/BMS to the mid LAD with 0% residual stenosis   Admitted to the hospital in 04/2021 with acute on chronic combined CHF and elevated troponin peaking at 448, felt to be related to demand ischemia.  Was improved with IV diuresis. Echo in 04/2021 demonstrating an EF of 30 to 35% with global hypokinesis, mild, grade 1 diastolic dysfunction, normal RV systolic function with mildly enlarged cavity size, PASP 54.5 mmHg, mildly dilated left atrium, severely dilated right atrium and estimated right atrial pressure of 8 mmHg.   Admitted 12/12/22 due to new onset of productive cough, shortness of breath and wheezing for 3 days prior to presentation. In the ED patient was hypoxic with pulse ox of 80% on room air. Chest x-ray showed pulmonary vascular congestion. Given IV lasix . Echocardiogram this admission shows LV ejection fraction of 40 to 45% with global hypokinesis.  Improvement in ejection fraction compared to echo from 04/2021. Qualified for home oxygen .    Seen in AHF clinic 9/324, his BP in his left arm read with systolics in the 50s. BP on the right with systolic in the 80s. His furosemide  was held and GDMT decreased.     AHF clinic 12/21/22 labs showed possible over diuresis and SBP was soft. Symptoms suspected to be more due his advanced COPD that HF, switched lasix  to PRN.    Echo 12/12/22: EF 40-45% with moderate LVH, Grade I DD, normal PA Pressure with mild Nathaniel   Admitted 01/16/23 with HF flare. Diuresed with IV lasix . Farxiga  added. No discharge weight recorded.   Admitted once 03/25 with pneumonia. Admitted twice 04/25, once with a stroke and once with hip fracture. Echo 08/06/23: EF 30-35%, severe hypokinesis, no thrombus, G1DD, mildly reduced RV  He presents today for a HF follow-up visit with a chief complaint of    Previous cardiac studies:  Echo 12/25/17: EF 30-35% grade 1 diastolic dysfunction, mildly dilated aortic root at 39 mm, mild mitral regurgitation, mildly dilated left atrium and normal pulmonary arterial pressure. Echo 10/25/18: EF 30-35% with mild LVH Echo 06/24/20: EF 30-35% with Grade 1 DD Echo 02/21/21: EF 40-45% with mild LVH, Grade I DD Echo 04/25/21: EF 30-35% with Grade I DD, mild LVH, moderately elevated PA pressure of 54.5 mmHg, mild Nathaniel Echo 12/12/22: EF 40-45% with moderate LVH, Grade I DD, normal PA Pressure with mild Nathaniel  LHC 01/05/11: 95% stenosis of the proximal LAD, 95% stenosis of the mid LAD, 30% in-stent restenosis of  the mid left circumflex with a second lesion of diffuse 50% stenosis. Underwent successful PCI/BMS to the mid LAD with 0% residual stenosis .   ROS: All systems negative except as listed in HPI, PMH and Problem List.  SH:  Social History   Socioeconomic History   Marital status: Married    Spouse name: Not on file   Number of children: Not on file   Years of education: Not on file   Highest  education level: Not on file  Occupational History   Not on file  Tobacco Use   Smoking status: Every Day    Current packs/day: 0.25    Average packs/day: 0.3 packs/day for 56.0 years (14.0 ttl pk-yrs)    Types: Cigarettes   Smokeless tobacco: Never   Tobacco comments:    back to smoking X1 year  Vaping Use   Vaping status: Never Used  Substance and Sexual Activity   Alcohol use: No    Alcohol/week: 0.0 standard drinks of alcohol   Drug use: No   Sexual activity: Not Currently  Other Topics Concern   Not on file  Social History Narrative   Not on file   Social Drivers of Health   Financial Resource Strain: Low Risk  (01/03/2023)   Received from [facility]   Overall Financial Resource Strain (CARDIA)    Difficulty of Paying Living Expenses: Not hard at all  Food Insecurity: No Food Insecurity (08/08/2023)   Hunger Vital Sign    Worried About Running Out of Food in the Last Year: Never true    Ran Out of Food in the Last Year: Never true  Transportation Needs: No Transportation Needs (08/08/2023)   PRAPARE - Administrator, Civil Service (Medical): No    Lack of Transportation (Non-Medical): No  Physical Activity: Not on file  Stress: No Stress Concern Present (08/21/2018)   [employer] of Occupational Health - Occupational Stress Questionnaire    Feeling of Stress : Not at all  Social Connections: Moderately Isolated (08/08/2023)   Social Connection and Isolation Panel    Frequency of Communication with Friends and Family: Three times a week    Frequency of Social Gatherings with Friends and Family: Three times a week    Attends Religious Services: More than 4 times per year    Active Member of Clubs or Organizations: No    Attends Banker Meetings: Never    Marital Status: Widowed  Intimate Partner Violence: Not At Risk (08/08/2023)   Humiliation, Afraid, Rape, and Kick questionnaire    Fear of Current or Ex-Partner: No     Emotionally Abused: No    Physically Abused: No    Sexually Abused: No    FH:  Family History  Problem Relation Age of Onset   Heart attack Mother    Diabetes Mother    Heart disease Father    Lung cancer Daughter     Past Medical History:  Diagnosis Date   AAA (abdominal aortic aneurysm)    a.  CTA 7/19: measured 4.5 x 5.3 cm and greatest transverse dimensions   Abnormal nuclear stress test    a.  Myoview  2012: anterior wall ischemia with an estimated EF of 26%.  This was a new wall motion abnormality as well as a newly reduced EF   Brain bleed (HCC)    COPD (chronic obstructive pulmonary disease) (HCC)    Coronary artery disease    a.  Posterior MI in  2002 status post BMS to the LCx; b. LHC 2012: 95% stenosis of the proximal LAD, 95% stenosis of the mid LAD, 30% in-stent restenosis of the mid left circumflex with a second lesion of diffuse 50% stenosis.  The patient underwent successful PCI/BMS to the mid LAD with 0% residual stenosis, LV gram not performed   GERD (gastroesophageal reflux disease)    History of kidney stones    History of prostate cancer    Hyperlipidemia    Hypertension    Myocardial infarction [facility])    Neuromuscular disorder (HCC)    Prostate cancer (HCC)    a.  Status post seeding   PVD (peripheral vascular disease)    Stroke [facility])     Current Outpatient Medications  Medication Sig Dispense Refill   acetaminophen  (TYLENOL ) 325 MG tablet Take 2 tablets (650 mg total) by mouth every 6 (six) hours as needed for mild pain (or Fever >/= 101).     albuterol  (PROVENTIL ) (2.5 MG/3ML) 0.083% nebulizer solution Take 2.5 mg by nebulization every 6 (six) hours as needed.     albuterol  (VENTOLIN  HFA) 108 (90 Base) MCG/ACT inhaler Inhale 2 puffs into the lungs every 6 (six) hours as needed for wheezing. 1 each 1   aspirin  EC 81 MG tablet Take 1 tablet (81 mg total) by mouth daily.     atorvastatin  (LIPITOR ) 80 MG tablet Take 1 tablet (80 mg total) by mouth daily. 90  tablet 3   clopidogrel  (PLAVIX ) 75 MG tablet Take 1 tablet (75 mg total) by mouth daily.     dapagliflozin  propanediol (FARXIGA ) 10 MG TABS tablet Take 1 tablet (10 mg total) by mouth daily. 90 tablet 3   ezetimibe  (ZETIA ) 10 MG tablet Take 1 tablet (10 mg total) by mouth daily.     furosemide  (LASIX ) 20 MG tablet Take 20 mg by mouth See admin instructions. Take one tablet by mouth on Sunday, Tuesday, Wednesday, Thursday, and Saturday.     furosemide  (LASIX ) 40 MG tablet Take 40 mg by mouth 2 (two) times a week. Take one tablet my mouth on Monday and Friday.     ipratropium-albuterol  (DUONEB) 0.5-2.5 (3) MG/3ML SOLN Use twice a day scheduled and every 4 hours as needed for shortness of breath and wheezing (Patient taking differently: Inhale 3 mLs into the lungs 2 (two) times daily.) 360 mL 0   ipratropium-albuterol  (DUONEB) 0.5-2.5 (3) MG/3ML SOLN Inhale 3 mLs into the lungs every 4 (four) hours as needed (Shortness of breath or wheezing).     midodrine  (PROAMATINE ) 5 MG tablet Take 1 tablet (5 mg total) by mouth 2 (two) times daily with a meal.     Multiple Vitamins-Minerals (MULTIVITAMIN WITH MINERALS) tablet Take 1 tablet by mouth daily.     omeprazole  (PRILOSEC) 40 MG capsule TAKE 1 CAPSULE BY MOUTH DAILY USUALLY BEFORE BREAKFAST 30 capsule 0   polyethylene glycol (MIRALAX ) 17 g packet Take 17 g by mouth daily. 30 each 1   spironolactone  (ALDACTONE ) 25 MG tablet Take 0.5 tablets (12.5 mg total) by mouth daily. FOR CHRONIC COMBINED CHF 45 tablet 3   tiotropium (SPIRIVA  HANDIHALER) 18 MCG inhalation capsule Place 1 capsule (18 mcg total) into inhaler and inhale daily. 30 capsule 0   No current facility-administered medications for this visit.      PHYSICAL EXAM:  General: elderly appearing in wheelchair. No resp difficulty HEENT: normal Neck: supple, no JVD Cor: Regular rhythm, bradycardic. No rubs, gallops or murmurs Lungs: clear although diminished in  bases Abdomen: soft,  nontender, nondistended. Extremities: no cyanosis, clubbing, rash, edema Neuro: alert & oriented X 3. Moves all 4 extremities w/o difficulty. Affect pleasant  ECG: not done   ASSESSMENT & PLAN:  1: Ischemic heart failure with mildly reduced ejection fraction- - due to CAD with stent 2012 - NYHA class III; main limitation likely deconditioning and COPD - euvolemic - weight 171.6 pounds from last visit here 6 months ago - Echo 10/25/18: EF 30-35% with mild LVH - Echo 06/24/20: EF 30-35% with Grade 1 DD - Echo 02/21/21: EF 40-45% with mild LVH, Grade I DD - Echo 04/25/21: EF 30-35% with Grade I DD, mild LVH, moderately elevated PA pressure of 54.5 mmHg, mild Nathaniel - Echo 12/12/22: EF 40-45% with moderate LVH, Grade I DD, normal PA Pressure with mild Nathaniel - Echo 08/06/23: EF 30-35%, severe hypokinesis, no thrombus, G1DD, mildly reduced RV - not adding salt to his food - continue farxiga  10mg  daily - continue furosemide  40mg  M & F and 20mg  the other days of the week - continue entresto  24/26mg  BID - continue spironolactone  12.5mg  daily - had bisoprolol  stopped due to bradycardia/ hypotension - would titrate CHF meds slowly if able, as he previously didn't tolerated higher doses 2/2 significant hypotension.  - BNP 07/07/23 was 1463.7  2: HTN- - BP  - saw PCP Ted) 09/24 - BMP 09/20/23 reviewed: sodium 140, potassium 3.8, creatinine 1.02 and GFR >60   3: CAD- - saw cardiology ([place]) 12/24 - continue ASA 81mg  daily - continue atorvastatin  80mg  daily - LDL 12/29/22 was 67 - no s/s angina - LHC 01/05/11: 95% stenosis of proximal LAD, 95% stenosis of the mid LAD, 30% in-stent restenosis of the mid left circumflex with a second lesion of diffuse 50% stenosis. Underwent successful PCI/BMS to the mid LAD with 0% residual stenosis .   4: COPD- - wearing oxygen  at 4L around the clock - continues to use inhalers - continues to smoke but takes his oxygen  off and doesn't smoke anywhere near the  oxygen   5: Frequent PVC's/ PAC's- - keep K >4.0 - keep Mg >2.0 - Mg 09/20/23 was 1.9     Ellouise Class, [place] 01/10/24

## 2024-01-10 NOTE — Telephone Encounter (Signed)
 Patient did not show for his Heart Failure Clinic appointment on 01/10/24.

## 2024-02-01 DIAGNOSIS — R059 Cough, unspecified: Secondary | ICD-10-CM | POA: Diagnosis not present

## 2024-02-02 DIAGNOSIS — I10 Essential (primary) hypertension: Secondary | ICD-10-CM | POA: Diagnosis not present

## 2024-02-06 DIAGNOSIS — Z9981 Dependence on supplemental oxygen: Secondary | ICD-10-CM | POA: Diagnosis not present

## 2024-02-06 DIAGNOSIS — J9611 Chronic respiratory failure with hypoxia: Secondary | ICD-10-CM | POA: Diagnosis not present

## 2024-02-06 DIAGNOSIS — I110 Hypertensive heart disease with heart failure: Secondary | ICD-10-CM | POA: Diagnosis not present

## 2024-02-18 DIAGNOSIS — E785 Hyperlipidemia, unspecified: Secondary | ICD-10-CM | POA: Diagnosis not present

## 2024-02-18 DIAGNOSIS — J449 Chronic obstructive pulmonary disease, unspecified: Secondary | ICD-10-CM | POA: Diagnosis not present

## 2024-02-18 DIAGNOSIS — I110 Hypertensive heart disease with heart failure: Secondary | ICD-10-CM | POA: Diagnosis not present

## 2024-02-18 DIAGNOSIS — I5022 Chronic systolic (congestive) heart failure: Secondary | ICD-10-CM | POA: Diagnosis not present

## 2024-02-18 DIAGNOSIS — K219 Gastro-esophageal reflux disease without esophagitis: Secondary | ICD-10-CM | POA: Diagnosis not present

## 2024-02-18 DIAGNOSIS — I2510 Atherosclerotic heart disease of native coronary artery without angina pectoris: Secondary | ICD-10-CM | POA: Diagnosis not present

## 2024-02-18 DIAGNOSIS — N182 Chronic kidney disease, stage 2 (mild): Secondary | ICD-10-CM | POA: Diagnosis not present

## 2024-03-02 NOTE — Progress Notes (Unsigned)
 Cardiology Office Note    Date:  03/04/2024   ID:  [name], DOB 04/22/1945, MRN [id number]  PCP:  Cleotilde Oneil FALCON, MD  Cardiologist:  Evalene Lunger, MD  Electrophysiologist:  None   Chief Complaint: Follow-up  History of Present Illness:   [name] is a 78 y.o. male with history of CAD with history of posterior MI in 2002 status post BMS to the LCx status post PCI/BMS to the LAD in 2012, chronic combined systolic and diastolic CHF with ischemic cardiomyopathy, CVA in 07/2023, cerebrovascular disease, AAA status post endovascular repair in 12/2017 followed by vascular surgery, intraparenchymal hemorrhage in 02/2021, HTN, HLD, COPD, pulmonary hypertension, prostate cancer status post seed implantation, tobacco use, and nephrolithiasis who presents for follow-up of his CAD and cardiomyopathy.  He underwent Myoview  in 2010 that was negative for ischemia with an EF of 60%.  Abnormal EKG was noted in the office in 2012 with follow-up nuclear stress testing showing anterior wall ischemia with an estimated EF of 26%.  Follow-up cardiac cath on 01/05/2011 showed 95% stenosis of the proximal LAD, 95% stenosis of the mid LAD, 30% in-stent restenosis of the mid left circumflex with a second lesion of diffuse 50% stenosis.  He underwent successful PCI/BMS to the mid LAD with 0% residual stenosis.  Lexiscan  MPI in 12/2017 showed a small defect of moderate severity present in the basal inferior and apex location with findings consistent with prior MI and an EF of 16%.  Overall, this was a high risk study.  Follow-up echo that month demonstrated an EF of 30 to 35%, moderately dilated LV cavity size, mild concentric LVH, grade 1 diastolic dysfunction, mild mitral regurgitation, mildly dilated left atrium, and normal PASP.  Subsequent echocardiograms over the years have demonstrated a persistent cardiomyopathy with patient declination for ICD.  He was admitted to the hospital in 02/2021 with an  intraparenchymal hemorrhage managed conservatively by neurosurgery.  He was admitted to the hospital in 04/2021 with acute on chronic combined CHF and elevated troponin peaking at 448, felt to be related to demand ischemia.  Echo in 04/2021 demonstrated an EF of 30 to 35% with global hypokinesis, mild, grade 1 diastolic dysfunction, normal RV systolic function with mildly enlarged cavity size, PASP 54.5 mmHg, mildly dilated left atrium, severely dilated right atrium and estimated right atrial pressure of 8 mmHg.  He was admitted to the hospital in 11/2022 with productive cough, dyspnea, and wheezing for 3 days prior to presentation.  He was hypoxic with a pulse ox of 80% on room air.  Chest x-ray showed pulmonary vascular congestion.  He was given IV Lasix .  Echo showed an EF of 40 to 45% with global hypokinesis, grade 1 diastolic dysfunction, normal RV systolic function with mildly enlarged ventricular cavity size, normal RVSP, mild biatrial enlargement, mild mitral regurgitation, and normal CVP.  This was an improvement when compared to echo from 04/2021.  He qualified for home oxygen .  He was readmitted in 01/2023 with CHF exacerbation, diuresed with IV Lasix .  He was started on Farxiga .    He was last seen by general cardiology in 03/2023 reporting chronic dyspnea was stable on baseline 3 L supplemental oxygen .  He reported bisoprolol  had been discontinued due to bradycardia.  He was continued on Entresto , spironolactone , Farxiga , and furosemide .    He was admitted in 06/2023 with sepsis secondary to pneumonia.  In that setting, blood pressure was soft leading to the discontinuation of Entresto  and initiation of  midodrine   He was admitted to the hospital in 07/2023 after presenting with left-sided weakness and speech difficulty found to have an acute right MCA stroke in the brain.  CTA head and neck showed no emergent large vessel occlusion with severe left P2 PCA stenosis, severe left vertebral artery origin  stenosis with diminished opacification of the more distal vertebral artery, moderate left intradural vertebral artery stenosis, moderate subclavian artery stenosis, and aortic atherosclerosis.  Echo showed an EF of 30 to 35% with severe hypokinesis of the anteroseptal, anterior, and inferior walls as well as apex, no LV thrombus, grade 1 diastolic dysfunction, mildly reduced RV systolic function with normal ventricular cavity size and normal RVSP, trivial mitral regurgitation, and normal CVP.  From a cardiac perspective, there was concern he was volume depleted with blood pressure too low for escalation of of GDMT.  Outpatient cardiac monitoring was recommended, though unavailable for review.  Neurology recommended DAPT with aspirin  81 mg and clopidogrel  75 mg for 21 days followed by discontinuation of aspirin  at that time with continuation of clopidogrel .  Outpatient follow-up with neurology was recommended in 2 to 3 weeks.  He comes in today from assisted living facility accompanied by his daughter.  He is without symptoms of angina or cardiac decompensation.  He continues to struggle with fine motor skills involving the left upper extremity and ambulation following his CVA.  Chronic dyspnea is stable on supplemental oxygen  via nasal cannula at 3 L.  No lower extremity swelling, abdominal distention, or progressive orthopnea.  No symptoms of hematochezia or melena.  Upon reviewing meds from living facility, from a cardiac perspective, he is currently taking aspirin  81 mg, clopidogrel  75 mg, atorvastatin  80 mg, ezetimibe  10 mg, Farxiga  10 mg, furosemide  40 mg on Monday and Friday with 20 mg the remaining days of the week, midodrine  5 mg twice daily, and spironolactone  12.5 mg.  Today, his weight is down approximately 5 pounds when compared to his last clinic visit in our office in 03/2023.   Labs independently reviewed: 09/2023 - potassium 3.8, BUN 15, serum creatinine 1.02, albumin 2.7, AST/ALT normal, Hgb  12.1, PLT 201, magnesium  1.9 07/2023 - A1c 5.2, TC 133, TG 140, HDL 29, LDL 76 06/2023 - TSH normal  Past Medical History:  Diagnosis Date   AAA (abdominal aortic aneurysm)    a.  CTA 7/19: measured 4.5 x 5.3 cm and greatest transverse dimensions   Abnormal nuclear stress test    a.  Myoview  2012: anterior wall ischemia with an estimated EF of 26%.  This was a new wall motion abnormality as well as a newly reduced EF   Brain bleed (HCC)    COPD (chronic obstructive pulmonary disease) (HCC)    Coronary artery disease    a.  Posterior MI in 2002 status post BMS to the LCx; b. LHC 2012: 95% stenosis of the proximal LAD, 95% stenosis of the mid LAD, 30% in-stent restenosis of the mid left circumflex with a second lesion of diffuse 50% stenosis.  The patient underwent successful PCI/BMS to the mid LAD with 0% residual stenosis, LV gram not performed   GERD (gastroesophageal reflux disease)    History of kidney stones    History of prostate cancer    Hyperlipidemia    Hypertension    Myocardial infarction [facility])    Neuromuscular disorder (HCC)    Prostate cancer (HCC)    a.  Status post seeding   PVD (peripheral vascular disease)    Stroke (HCC)  Past Surgical History:  Procedure Laterality Date   BACK SURGERY  2009/2010   x 2   BOWEL RESECTION N/A 03/18/2021   Procedure: SMALL BOWEL RESECTION;  Surgeon: Paola Dreama SAILOR, MD;  Location: [facility] OR;  Service: General;  Laterality: N/A;   CARDIAC CATHETERIZATION  2002   stent placement [place]   ENDOVASCULAR STENT GRAFT (AAA) N/A 01/02/2018   Procedure: ENDOVASCULAR REPAIR/STENT GRAFT;  Surgeon: Marea Selinda RAMAN, MD;  Location: [facility] INVASIVE CV LAB;  Service: Cardiovascular;  Laterality: N/A;   FOOT SURGERY     bilateral    HERNIA REPAIR     bilateral    HIP ARTHROPLASTY Left 07/28/2023   Procedure: LEFT HIP HEMIARTHROPLASTY;  Surgeon: Germaine Redbird, MD;  Location: [facility] OR;  Service: Orthopedics;  Laterality: Left;   INGUINAL HERNIA REPAIR  Left 03/18/2021   Procedure: INGUINAL HERNIA REPAIR;  Surgeon: Paola Dreama SAILOR, MD;  Location: [facility] OR;  Service: General;  Laterality: Left;   INSERTION OF MESH Left 03/18/2021   Procedure: INSERTION OF MESH;  Surgeon: Paola Dreama SAILOR, MD;  Location: [facility] OR;  Service: General;  Laterality: Left;   KNEE SURGERY     right knee    PROSTATE SURGERY  09/2009   prostate implant   SPINE SURGERY     UMBILICAL HERNIA REPAIR N/A 03/18/2021   Procedure: UMBILICAL HERNIA REPAIR;  Surgeon: Paola Dreama SAILOR, MD;  Location: [facility] OR;  Service: General;  Laterality: N/A;    Current Medications: Current Meds  Medication Sig   acetaminophen  (TYLENOL ) 325 MG tablet Take 2 tablets (650 mg total) by mouth every 6 (six) hours as needed for mild pain (or Fever >/= 101).   albuterol  (PROVENTIL ) (2.5 MG/3ML) 0.083% nebulizer solution Take 2.5 mg by nebulization every 6 (six) hours as needed.   albuterol  (VENTOLIN  HFA) 108 (90 Base) MCG/ACT inhaler Inhale 2 puffs into the lungs every 6 (six) hours as needed for wheezing.   atorvastatin  (LIPITOR ) 80 MG tablet Take 1 tablet (80 mg total) by mouth daily.   clopidogrel  (PLAVIX ) 75 MG tablet Take 1 tablet (75 mg total) by mouth daily.   dapagliflozin  propanediol (FARXIGA ) 10 MG TABS tablet Take 1 tablet (10 mg total) by mouth daily.   ezetimibe  (ZETIA ) 10 MG tablet Take 1 tablet (10 mg total) by mouth daily.   furosemide  (LASIX ) 20 MG tablet Take 20 mg by mouth See admin instructions. Take one tablet by mouth on Sunday, Tuesday, Wednesday, Thursday, and Saturday.   furosemide  (LASIX ) 40 MG tablet Take 40 mg by mouth 2 (two) times a week. Take one tablet my mouth on Monday and Friday.   ipratropium-albuterol  (DUONEB) 0.5-2.5 (3) MG/3ML SOLN Use twice a day scheduled and every 4 hours as needed for shortness of breath and wheezing (Patient taking differently: Inhale 3 mLs into the lungs 2 (two) times daily.)   ipratropium-albuterol  (DUONEB) 0.5-2.5 (3) MG/3ML SOLN Inhale 3 mLs  into the lungs every 4 (four) hours as needed (Shortness of breath or wheezing).   midodrine  (PROAMATINE ) 5 MG tablet Take 1 tablet (5 mg total) by mouth 2 (two) times daily with a meal.   Multiple Vitamins-Minerals (MULTIVITAMIN WITH MINERALS) tablet Take 1 tablet by mouth daily.   omeprazole  (PRILOSEC) 40 MG capsule TAKE 1 CAPSULE BY MOUTH DAILY USUALLY BEFORE BREAKFAST   spironolactone  (ALDACTONE ) 25 MG tablet Take 0.5 tablets (12.5 mg total) by mouth daily. FOR CHRONIC COMBINED CHF   tiotropium (SPIRIVA  HANDIHALER) 18 MCG inhalation capsule Place 1  capsule (18 mcg total) into inhaler and inhale daily.   [DISCONTINUED] aspirin  EC 81 MG tablet Take 1 tablet (81 mg total) by mouth daily.    Allergies:   Lyrica [pregabalin]   Social History   Socioeconomic History   Marital status: Married    Spouse name: Not on file   Number of children: Not on file   Years of education: Not on file   Highest education level: Not on file  Occupational History   Not on file  Tobacco Use   Smoking status: Every Day    Current packs/day: 0.25    Average packs/day: 0.3 packs/day for 56.0 years (14.0 ttl pk-yrs)    Types: Cigarettes   Smokeless tobacco: Never   Tobacco comments:    back to smoking X1 year  Vaping Use   Vaping status: Never Used  Substance and Sexual Activity   Alcohol use: No    Alcohol/week: 0.0 standard drinks of alcohol   Drug use: No   Sexual activity: Not Currently  Other Topics Concern   Not on file  Social History Narrative   Not on file   Social Drivers of Health   Financial Resource Strain: Low Risk  (01/03/2023)   Received from [facility]   Overall Financial Resource Strain (CARDIA)    Difficulty of Paying Living Expenses: Not hard at all  Food Insecurity: No Food Insecurity (08/08/2023)   Hunger Vital Sign    Worried About Running Out of Food in the Last Year: Never true    Ran Out of Food in the Last Year: Never true   Transportation Needs: No Transportation Needs (08/08/2023)   PRAPARE - Administrator, Civil Service (Medical): No    Lack of Transportation (Non-Medical): No  Physical Activity: Not on file  Stress: No Stress Concern Present (08/21/2018)   [employer] of Occupational Health - Occupational Stress Questionnaire    Feeling of Stress : Not at all  Social Connections: Moderately Isolated (08/08/2023)   Social Connection and Isolation Panel    Frequency of Communication with Friends and Family: Three times a week    Frequency of Social Gatherings with Friends and Family: Three times a week    Attends Religious Services: More than 4 times per year    Active Member of Clubs or Organizations: No    Attends Banker Meetings: Never    Marital Status: Widowed     Family History:  The patient's family history includes Diabetes in his mother; Heart attack in his mother; Heart disease in his father; Lung cancer in his daughter.  ROS:   12-point review of systems is negative unless otherwise noted in the HPI.   EKGs/Labs/Other Studies Reviewed:    Studies reviewed were summarized above. The additional studies were reviewed today:  2D echo 08/06/2023: 1. Left ventricular ejection fraction, by estimation, is 30 to 35%. The  left ventricle has moderately decreased function. The left ventricle  demonstrates regional wall motion abnormalities with severe hypokinesis of  the anterosepl, anterior, and  inferior walls as well as the apex. No LV thrombus noted. Left ventricular  diastolic parameters are consistent with Grade I diastolic dysfunction  (impaired relaxation).   2. Right ventricular systolic function is mildly reduced. The right  ventricular size is normal. There is normal pulmonary artery systolic  pressure. The estimated right ventricular systolic pressure is 29.0 mmHg.   3. The mitral valve is normal in structure. Trivial mitral valve  regurgitation. No  evidence of mitral stenosis.   4. The aortic valve was not well visualized. Aortic valve regurgitation  is trivial. No aortic stenosis is present.   5. The inferior vena cava is normal in size with greater than 50%  respiratory variability, suggesting right atrial pressure of 3 mmHg.  __________  2D echo 12/12/2022: 1. Left ventricular ejection fraction, by estimation, is 40 to 45%. The  left ventricle has mildly decreased function. The left ventricle  demonstrates global hypokinesis. There is moderate left ventricular  hypertrophy. Left ventricular diastolic  parameters are consistent with Grade I diastolic dysfunction (impaired  relaxation).   2. Right ventricular systolic function is normal. The right ventricular  size is mildly enlarged. There is normal pulmonary artery systolic  pressure.   3. Left atrial size was mildly dilated.   4. Right atrial size was mildly dilated.   5. The mitral valve is grossly normal. Mild mitral valve regurgitation.  No evidence of mitral stenosis.   6. The aortic valve was not well visualized. Aortic valve regurgitation  is not visualized. No aortic stenosis is present.   7. Pulmonic valve regurgitation not well-assessed.   8. The inferior vena cava is normal in size with greater than 50%  respiratory variability, suggesting right atrial pressure of 3 mmHg.  __________   2D echo 04/25/2021: 1. Left ventricular ejection fraction, by estimation, is 30 to 35%. The  left ventricle has moderately decreased function. The left ventricle  demonstrates global hypokinesis. There is mild left ventricular  hypertrophy. Left ventricular diastolic  parameters are consistent with Grade I diastolic dysfunction (impaired  relaxation).   2. Right ventricular systolic function is normal. The right ventricular  size is mildly enlarged. There is moderately elevated pulmonary artery  systolic pressure. The estimated right ventricular systolic pressure is  54.5 mmHg.    3. Left atrial size was mildly dilated.   4. Right atrial size was severely dilated.   5. The mitral valve is normal in structure. Mild mitral valve  regurgitation.   6. The aortic valve was not well visualized. Aortic valve regurgitation  is not visualized. No aortic stenosis is present.   7. The inferior vena cava is dilated in size with >50% respiratory  variability, suggesting right atrial pressure of 8 mmHg. __________   2D echo 02/21/2021: 1. Left ventricular ejection fraction, by estimation, is 40 to 45%. Left  ventricular ejection fraction by 2D MOD biplane is 44.2 %. The left  ventricle has mild to moderately decreased function. The left ventricle  demonstrates global hypokinesis. There  is mild left ventricular hypertrophy of the basal-septal segment. Left  ventricular diastolic parameters are consistent with Grade I diastolic  dysfunction (impaired relaxation).   2. Right ventricular systolic function is normal. The right ventricular  size is normal.   3. The mitral valve is normal in structure. No evidence of mitral valve  regurgitation.   4. The aortic valve was not well visualized. Aortic valve regurgitation  is not visualized.   5. The inferior vena cava is normal in size with <50% respiratory  variability, suggesting right atrial pressure of 8 mmHg. __________   2D echo 06/24/2020: 1. Left ventricular ejection fraction, by estimation, is 30 to 35%. The  left ventricle has moderate to severely decreased function. The left  ventricle demonstrates global hypokinesis. The left ventricular internal  cavity size was mildly dilated. Left  ventricular diastolic parameters are consistent with Grade I diastolic  dysfunction (impaired relaxation).  2. Right ventricular systolic function is low normal. The right  ventricular size is normal.   3. The mitral valve is normal in structure. No evidence of mitral valve  regurgitation.   4. The aortic valve is grossly normal.  Aortic valve regurgitation is  trivial. __________   2D echo 10/25/2018: 1. Severe hypokinesis of the left ventricular, mid-apical anteroseptal  wall, anterior wall, inferoseptal wall and apical segment.   2. The left ventricle has moderate-severely reduced systolic function,  with an ejection fraction of 30-35%. The cavity size was moderately  dilated. There is mild concentric left ventricular hypertrophy. Left  ventricular diastolic Doppler parameters are  consistent with impaired relaxation.   3. The right ventricle has normal systolic function. The cavity was  normal. There is no increase in right ventricular wall thickness.   4. Left atrial size was mildly dilated.   5. Right atrial size was mildly dilated.   6. The mitral valve is grossly normal.   7. The tricuspid valve is grossly normal.   8. The aortic valve is grossly normal. Aortic valve regurgitation is  trivial by color flow Doppler. No stenosis of the aortic valve. __________   2D echo 12/25/2017: - Left ventricle: The cavity size was moderately dilated. There was    mild concentric hypertrophy. Systolic function was moderately to    severely reduced. The estimated ejection fraction was in the    range of 30% to 35%. Wall motion was normal; there were no    regional wall motion abnormalities. Doppler parameters are    consistent with abnormal left ventricular relaxation (grade 1    diastolic dysfunction).  - Aorta: Aortic root dimension: 39 mm (ED).  - Mitral valve: There was mild regurgitation.  - Left atrium: The atrium was mildly dilated.  - Pulmonary arteries: Systolic pressure was within the normal    range. __________   Lexiscan  MPI 12/21/2017: T wave inversion was noted during stress in the V5 and V6 leads. There was no ST segment deviation noted during stress. Defect 1: There is a small defect of moderate severity present in the basal inferior and apex location. Findings consistent with prior myocardial  infarction. This is a high risk study. Nuclear stress EF: 16%.   EKG:  EKG is ordered today.  The EKG ordered today demonstrates NSR, 64 bpm, left axis deviation, first-degree AV block, LVH, prior inferoposterior infarct, nonspecific ST-T changes occasional PACs, significant baseline artifact and wandering  Recent Labs: 07/07/2023: B Natriuretic Peptide 1,463.7 09/20/2023: ALT 14; BUN 15; Creatinine, Ser 1.02; Hemoglobin 12.1; Magnesium  1.9; Platelets 201; Potassium 3.8; Sodium 140  Recent Lipid Panel    Component Value Date/Time   CHOL 133 08/06/2023 0240   CHOL 154 03/04/2013 0831   TRIG 140 08/06/2023 0240   HDL 29 (L) 08/06/2023 0240   HDL 39 (L) 03/04/2013 0831   CHOLHDL 4.6 08/06/2023 0240   VLDL 28 08/06/2023 0240   LDLCALC 76 08/06/2023 0240   LDLCALC 88 03/04/2013 0831    PHYSICAL EXAM:    VS:  BP 136/80 (BP Location: Right Arm, Patient Position: Sitting, Cuff Size: Normal)   Pulse 64 Comment: 62 oximeter  Ht 6' (1.829 m)   Wt 170 lb 12.8 oz (77.5 kg)   SpO2 99%   BMI 23.16 kg/m   BMI: Body mass index is 23.16 kg/m.  Physical Exam Vitals reviewed.  Constitutional:      Appearance: He is well-developed.     Comments: Presents in a wheelchair.  Chronically ill-appearing.  HENT:     Head: Normocephalic and atraumatic.  Eyes:     General:        Right eye: No discharge.        Left eye: No discharge.  Cardiovascular:     Rate and Rhythm: Normal rate and regular rhythm.     Heart sounds: Normal heart sounds, S1 normal and S2 normal. Heart sounds not distant. No midsystolic click and no opening snap. No murmur heard.    No friction rub.  Pulmonary:     Effort: Pulmonary effort is normal. No respiratory distress.     Breath sounds: Decreased breath sounds present. No wheezing, rhonchi or rales.     Comments: Supplemental oxygen  via nasal cannula at 3 L. Musculoskeletal:     Cervical back: Normal range of motion.     Right lower leg: No edema.     Left lower  leg: No edema.  Skin:    General: Skin is warm and dry.     Nails: There is no clubbing.  Neurological:     Mental Status: He is alert and oriented to person, place, and time.  Psychiatric:        Speech: Speech normal.        Behavior: Behavior normal.        Thought Content: Thought content normal.        Judgment: Judgment normal.     Wt Readings from Last 3 Encounters:  03/04/24 170 lb 12.8 oz (77.5 kg)  09/13/23 164 lb 0.4 oz (74.4 kg)  08/08/23 164 lb 0.4 oz (74.4 kg)     ASSESSMENT & PLAN:   HFrEF: He appears euvolemic.  NYHA class is difficult to assess secondary to CVA with limited functional status.  No longer on Entresto  or beta-blocker secondary to relative hypotension.  Blood pressure is well-controlled today and would look to reescalate GDMT moving forward if blood pressure continues to allow.  BP currently augmented with midodrine , upon reviewing BP log from living facility, his lowest blood pressure there has been 116 mmHg systolic.  Would look to de-escalate midodrine  therapy moving forward as able.  Over the years his EF has fluctuated between 35 and 45% with most recent echo at the time of CVA in 07/2023 showing an EF of 35%.  For now, he remains on Farxiga  10 mg, spironolactone  12.5 mg, and furosemide  40 mg on Mondays and Fridays with 20 mg every other day of the week.  Repeat echo to further evaluate cardiomyopathy and assist in pharmacotherapy moving forward.  This will also be obtained with a bubble study as outlined below given CVA.  Check CMP.  CAD involving the native coronary arteries without angina: No symptoms of angina.  We are discontinuing aspirin  as outlined below with recommendation to continue clopidogrel  75 mg monotherapy given CVA as recommended by neurology.  Update echo as above.  Continue atorvastatin  80 mg and now on ezetimibe  10 mg at time of CVA.  CVA with cerebrovascular disease: Admitted to the hospital in 07/2023 with right MCA territory infarct.   Echo at that time did not include bubble study.  Repeat echo with bubble study.  Place ZIO AT x 2, if these are unrevealing, recommend referral to EP for consideration of ILR.  Neurology recommended DAPT with aspirin  and clopidogrel  for 21 days post CVA, remains on DAPT at this time.  Per neurology recommendations, discontinue aspirin  with continuation of clopidogrel  75 mg daily monotherapy.  Has not followed  up with neurology in the outpatient setting.  He will be referred to neurology for ongoing management of his CVA and underlying cerebrovascular disease.  HTN: Blood pressure is reasonably controlled in the office today.  Remains on midodrine , would look to de-escalate moving forward.  Continue spironolactone  as outlined above.  HLD: LDL 76 in 07/2023.  Target LDL less than 70.  He remains on atorvastatin  80 mg and ezetimibe  10 mg.  Obtain LFT, lipid panel, and direct LDL.  AAA: Status post endovascular repair in 2019.  Last abdominal ultrasound from 2021 showing patent endovascular aneurysm repair with no evidence of endoleak with largest diameter relatively stable measuring 4.23 cm (previously 4.1 cm).  Has not followed up with vascular surgery or undergone routine follow-up imaging since.  He will be referred to vascular surgery for ongoing monitoring.  Chronic hypoxic respiratory failure with COPD and ongoing tobacco use: Stable, without acute exacerbation.  Follow-up with PCP and pulmonology.  Smoking cessation is encouraged.    Disposition: F/u with Dr. Gollan in 2 months.   Medication Adjustments/Labs and Tests Ordered: Current medicines are reviewed at length with the patient today.  Concerns regarding medicines are outlined above. Medication changes, Labs and Tests ordered today are summarized above and listed in the Patient Instructions accessible in Encounters.   Signed, Bernardino Bring, PA-C 03/04/2024 5:19 PM     [facility] [address] [place], [place] [address] [phone]

## 2024-03-04 ENCOUNTER — Encounter: Payer: Self-pay | Admitting: Physician Assistant

## 2024-03-04 ENCOUNTER — Ambulatory Visit

## 2024-03-04 ENCOUNTER — Ambulatory Visit: Attending: Physician Assistant | Admitting: Physician Assistant

## 2024-03-04 VITALS — BP 136/80 | HR 64 | Ht 72.0 in | Wt 170.8 lb

## 2024-03-04 DIAGNOSIS — E785 Hyperlipidemia, unspecified: Secondary | ICD-10-CM | POA: Diagnosis not present

## 2024-03-04 DIAGNOSIS — Z720 Tobacco use: Secondary | ICD-10-CM | POA: Diagnosis not present

## 2024-03-04 DIAGNOSIS — I2510 Atherosclerotic heart disease of native coronary artery without angina pectoris: Secondary | ICD-10-CM

## 2024-03-04 DIAGNOSIS — Z9981 Dependence on supplemental oxygen: Secondary | ICD-10-CM

## 2024-03-04 DIAGNOSIS — I639 Cerebral infarction, unspecified: Secondary | ICD-10-CM

## 2024-03-04 DIAGNOSIS — I5020 Unspecified systolic (congestive) heart failure: Secondary | ICD-10-CM

## 2024-03-04 DIAGNOSIS — J449 Chronic obstructive pulmonary disease, unspecified: Secondary | ICD-10-CM

## 2024-03-04 DIAGNOSIS — I679 Cerebrovascular disease, unspecified: Secondary | ICD-10-CM

## 2024-03-04 DIAGNOSIS — I10 Essential (primary) hypertension: Secondary | ICD-10-CM | POA: Diagnosis not present

## 2024-03-04 DIAGNOSIS — J9611 Chronic respiratory failure with hypoxia: Secondary | ICD-10-CM

## 2024-03-04 DIAGNOSIS — I714 Abdominal aortic aneurysm, without rupture, unspecified: Secondary | ICD-10-CM

## 2024-03-04 NOTE — Patient Instructions (Signed)
 Medication Instructions:  Your physician recommends the following medication changes.  STOP TAKING: Aspirin   *If you need a refill on your cardiac medications before your next appointment, please call your pharmacy*  Lab Work: None ordered at this time  If you have labs (blood work) drawn today and your tests are completely normal, you will receive your results only by: MyChart Message (if you have MyChart) OR A paper copy in the mail If you have any lab test that is abnormal or we need to change your treatment, we will call you to review the results.  Testing/Procedures: Your physician has requested that you have an echocardiogram WITH BUBBLES. Echocardiography is a painless test that uses sound waves to create images of your heart. It provides your doctor with information about the size and shape of your heart and how well your heart's chambers and valves are working.   You may receive an ultrasound enhancing agent through an IV if needed to better visualize your heart during the echo. This procedure takes approximately one hour.  There are no restrictions for this procedure.  This will take place at [address] [facility] [address]) [address], [place] [address]  Please note: We ask at that you not bring children with you during ultrasound (echo/ vascular) testing. Due to room size and safety concerns, children are not allowed in the ultrasound rooms during exams. Our front office staff cannot provide observation of children in our lobby area while testing is being conducted. An adult accompanying a patient to their appointment will only be allowed in the ultrasound room at the discretion of the ultrasound technician under special circumstances. We apologize for any inconvenience.  ZIO AT Long term monitor-Live Telemetry  Your physician has requested you wear a ZIO patch monitor for 28 days (this will be two monitors of 14 days back to back).  This is a single patch monitor. [employer]  supplies one patch monitor per enrollment.  Additional stickers are not available.  Please do not apply patch if you will be having a Nuclear Stress Test, Echocardiogram, Cardiac CT, MRI, or Chest Xray during the period you would be wearing the monitor.  The patch cannot be worn during these tests.  You cannot remove and re-apply the ZIO AT patch monitor.  Your ZIO patch monitor will be mailed 3 day USPS to your address on file. It may take 3-5 days to receive your monitor after you have been enrolled.  Once you have received your monitor, please review the enclosed instructions. Your monitor has already been registered assigning a specific monitor serial number to you.   Billing and Patient Assistance Program information  [place] has been supplied with any insurance information on record for billing.  [employer] offers a sliding scale Patient Assistance Program for patients without insurance, or whose insurance does not completely cover the cost of the ZIO patch monitor.  You must apply for the Patient Assistance Program to qualify for the discounted rate.  To apply, call [employer] at [phone], select option 4, select option 2 , ask to apply for the Patient Assistance Program, (you can request an interpreter if needed).  [employer] will ask your household income and how many people are in your household.  [employer] will quote your out-of-pocket cost based on this information. They will also be able to set up a 12 month interest free payment plan if needed.  Applying the monitor   Shave hair from upper left chest.  Hold the abrader disc by  orange tab. Rub the abrader in 40 strokes over left upper chest as indicated in your monitor instructions.  Clean area with 4 enclosed alcohol pads. Use all pads to ensure the area is cleaned thoroughly. Let dry.  Apply patch as indicated in monitor instructions. Patch will be placed under collarbone on left side of chest with arrow pointing upward.  Rub patch adhesive  wings for 2 minutes. Remove the white label marked 1. Remove the white label marked 2. Rub patch adhesive wings for 2 additional minutes.  While looking in a mirror, press and release button in center of patch. A small green light will flash 3-4 times.  This will be your only indicator that the monitor has been turned on.   Do not shower for the first 24 hours.  You may shower after the first 24 hours.  Press the button if you feel a symptom.  You will hear a small click.  Record Date, Time and Symptom in the Patient Log.   Starting the Gateway  In your kit there is a audiological scientist box the size of a cellphone.  This is [employer]. It transmits all your recorded data to [facility].  This box must always stay within 10 feet of you.  Open the box and push the * button.  There will be a light that blinks orange and then green a few times.  When the light stops blinking, the Gateway is connected to the ZIO patch. Call [employer] at [phone] to confirm your monitor is transmitting.  Returning your monitor  Remove your patch and place it inside the Gateway.  In the lower half of the Gateway box there is a white bag with prepaid postage on it.  Place Gateway in bag and seal.  Mail package back to [place] as soon as possible.  Your physician should have your final report approximately 7-14 days after you have mailed back your monitor. Call [facility] Customer Care at [phone] if you have questions regarding your ZIO AT patch monitor.   Call them immediately if you see an orange light blinking on your monitor.  If your monitor falls off in less than 4 days, contact our Monitor department at [phone]. If your monitor becomes loose or falls off after 4 days call [employer] at [phone] for suggestions on securing your monitor.    Follow-Up: At [facility], you and your health needs are our priority.  As part of our continuing mission to provide you with exceptional heart  care, our providers are all part of one team.  This team includes your primary Cardiologist (physician) and Advanced Practice Providers or APPs (Physician Assistants and Nurse Practitioners) who all work together to provide you with the care you need, when you need it.  Your next appointment:   2 month(s)  Provider:   You may see Timothy Gollan, MD  We recommend signing up for the patient portal called MyChart.  Sign up information is provided on this After Visit Summary.  MyChart is used to connect with patients for Virtual Visits (Telemedicine).  Patients are able to view lab/test results, encounter notes, upcoming appointments, etc.  Non-urgent messages can be sent to your provider as well.   To learn more about what you can do with MyChart, go to [email or web address].

## 2024-03-11 ENCOUNTER — Telehealth: Payer: Self-pay | Admitting: Emergency Medicine

## 2024-03-11 NOTE — Telephone Encounter (Signed)
 Received message that pt hasn't activated ZIO AT; called and spoke to [place] (per [facility]) reports: I wanted to call you because I didn't want him to be wearing the monitor during a test; advised to apply one now and then the second after Bubble study on 12/18  Thanked for called and reports intention to comply

## 2024-03-18 DIAGNOSIS — R1312 Dysphagia, oropharyngeal phase: Secondary | ICD-10-CM | POA: Diagnosis not present

## 2024-03-18 DIAGNOSIS — E785 Hyperlipidemia, unspecified: Secondary | ICD-10-CM | POA: Diagnosis not present

## 2024-03-18 DIAGNOSIS — R41841 Cognitive communication deficit: Secondary | ICD-10-CM | POA: Diagnosis not present

## 2024-03-18 DIAGNOSIS — J449 Chronic obstructive pulmonary disease, unspecified: Secondary | ICD-10-CM | POA: Diagnosis not present

## 2024-03-18 DIAGNOSIS — I639 Cerebral infarction, unspecified: Secondary | ICD-10-CM | POA: Diagnosis not present

## 2024-03-18 DIAGNOSIS — K2270 Barrett's esophagus without dysplasia: Secondary | ICD-10-CM | POA: Diagnosis not present

## 2024-03-18 DIAGNOSIS — D696 Thrombocytopenia, unspecified: Secondary | ICD-10-CM | POA: Diagnosis not present

## 2024-03-18 DIAGNOSIS — S72002D Fracture of unspecified part of neck of left femur, subsequent encounter for closed fracture with routine healing: Secondary | ICD-10-CM | POA: Diagnosis not present

## 2024-03-21 ENCOUNTER — Encounter (HOSPITAL_COMMUNITY): Payer: Self-pay | Admitting: Surgery

## 2024-03-24 ENCOUNTER — Telehealth: Payer: Self-pay

## 2024-03-24 DIAGNOSIS — I5020 Unspecified systolic (congestive) heart failure: Secondary | ICD-10-CM

## 2024-03-24 NOTE — Progress Notes (Signed)
 Complex Care Management Note  Care Guide Note 03/24/2024 Name: [name] MRN: [id number] DOB: Sep 15, 1945  [name] is a 78 y.o. year old male who sees Cleotilde Oneil FALCON, MD for primary care. I reached out to [name] by phone today to offer complex care management services.  [name] was given information about Complex Care Management services today including:   The Complex Care Management services include support from the care team which includes your Nurse Care Manager, Clinical Social Worker, or Pharmacist.  The Complex Care Management team is here to help remove barriers to the health concerns and goals most important to you. Complex Care Management services are voluntary, and the patient may decline or stop services at any time by request to their care team member.   Complex Care Management Consent Status: Patient did not agree to participate in complex care management services at this time.  Follow up plan:  Patient in long term care at [facility].   Encounter Outcome:  Patients daughter refused.  Dreama [facility]  [facility], [facility] VBCI Assistant Direct Dial: [phone]  Fax: [phone]

## 2024-03-25 ENCOUNTER — Encounter: Payer: Self-pay | Admitting: Physician Assistant

## 2024-03-27 DIAGNOSIS — R051 Acute cough: Secondary | ICD-10-CM | POA: Diagnosis not present

## 2024-03-27 DIAGNOSIS — R059 Cough, unspecified: Secondary | ICD-10-CM | POA: Diagnosis not present

## 2024-04-03 ENCOUNTER — Ambulatory Visit

## 2024-04-03 ENCOUNTER — Ambulatory Visit: Payer: Self-pay | Admitting: Physician Assistant

## 2024-04-03 DIAGNOSIS — I639 Cerebral infarction, unspecified: Secondary | ICD-10-CM

## 2024-04-03 MED ORDER — PERFLUTREN LIPID MICROSPHERE
1.0000 mL | INTRAVENOUS | Status: AC | PRN
  Administered 2024-04-03: 14:00:00 2 mL via INTRAVENOUS

## 2024-04-07 ENCOUNTER — Other Ambulatory Visit: Payer: Self-pay

## 2024-04-07 ENCOUNTER — Emergency Department (HOSPITAL_COMMUNITY)

## 2024-04-07 ENCOUNTER — Observation Stay (HOSPITAL_COMMUNITY)
Admission: EM | Admit: 2024-04-07 | Discharge: 2024-04-09 | Disposition: A | Discharge status: Skilled Nursing Facility | Attending: Family Medicine | Admitting: Family Medicine

## 2024-04-07 DIAGNOSIS — Z96642 Presence of left artificial hip joint: Secondary | ICD-10-CM | POA: Diagnosis not present

## 2024-04-07 DIAGNOSIS — I9589 Other hypotension: Secondary | ICD-10-CM | POA: Diagnosis not present

## 2024-04-07 DIAGNOSIS — N1831 Chronic kidney disease, stage 3a: Secondary | ICD-10-CM | POA: Diagnosis not present

## 2024-04-07 DIAGNOSIS — J441 Chronic obstructive pulmonary disease with (acute) exacerbation: Principal | ICD-10-CM | POA: Diagnosis present

## 2024-04-07 DIAGNOSIS — Z8546 Personal history of malignant neoplasm of prostate: Secondary | ICD-10-CM | POA: Insufficient documentation

## 2024-04-07 DIAGNOSIS — I2510 Atherosclerotic heart disease of native coronary artery without angina pectoris: Secondary | ICD-10-CM | POA: Insufficient documentation

## 2024-04-07 DIAGNOSIS — I130 Hypertensive heart and chronic kidney disease with heart failure and stage 1 through stage 4 chronic kidney disease, or unspecified chronic kidney disease: Secondary | ICD-10-CM | POA: Diagnosis not present

## 2024-04-07 DIAGNOSIS — K5909 Other constipation: Secondary | ICD-10-CM | POA: Insufficient documentation

## 2024-04-07 DIAGNOSIS — R9431 Abnormal electrocardiogram [ECG] [EKG]: Secondary | ICD-10-CM

## 2024-04-07 DIAGNOSIS — Z79899 Other long term (current) drug therapy: Secondary | ICD-10-CM | POA: Diagnosis not present

## 2024-04-07 DIAGNOSIS — E785 Hyperlipidemia, unspecified: Secondary | ICD-10-CM | POA: Insufficient documentation

## 2024-04-07 DIAGNOSIS — R7989 Other specified abnormal findings of blood chemistry: Secondary | ICD-10-CM | POA: Diagnosis present

## 2024-04-07 DIAGNOSIS — K219 Gastro-esophageal reflux disease without esophagitis: Secondary | ICD-10-CM | POA: Diagnosis not present

## 2024-04-07 DIAGNOSIS — I739 Peripheral vascular disease, unspecified: Secondary | ICD-10-CM | POA: Insufficient documentation

## 2024-04-07 DIAGNOSIS — I5033 Acute on chronic diastolic (congestive) heart failure: Secondary | ICD-10-CM | POA: Diagnosis not present

## 2024-04-07 DIAGNOSIS — Z7902 Long term (current) use of antithrombotics/antiplatelets: Secondary | ICD-10-CM | POA: Insufficient documentation

## 2024-04-07 DIAGNOSIS — J9620 Acute and chronic respiratory failure, unspecified whether with hypoxia or hypercapnia: Secondary | ICD-10-CM | POA: Diagnosis not present

## 2024-04-07 DIAGNOSIS — I509 Heart failure, unspecified: Principal | ICD-10-CM

## 2024-04-07 DIAGNOSIS — R0602 Shortness of breath: Secondary | ICD-10-CM | POA: Diagnosis present

## 2024-04-07 DIAGNOSIS — J9622 Acute and chronic respiratory failure with hypercapnia: Secondary | ICD-10-CM

## 2024-04-07 DIAGNOSIS — J9621 Acute and chronic respiratory failure with hypoxia: Secondary | ICD-10-CM

## 2024-04-07 DIAGNOSIS — I5023 Acute on chronic systolic (congestive) heart failure: Secondary | ICD-10-CM | POA: Insufficient documentation

## 2024-04-07 DIAGNOSIS — Z96651 Presence of right artificial knee joint: Secondary | ICD-10-CM | POA: Diagnosis not present

## 2024-04-07 LAB — BASIC METABOLIC PANEL WITH GFR
Anion gap: 9 (ref 5–15)
BUN: 17 mg/dL (ref 8–23)
CO2: 31 mmol/L (ref 22–32)
Calcium: 8.9 mg/dL (ref 8.9–10.3)
Chloride: 102 mmol/L (ref 98–111)
Creatinine, Ser: 1.27 mg/dL — ABNORMAL HIGH (ref 0.61–1.24)
GFR, Estimated: 57 mL/min — ABNORMAL LOW
Glucose, Bld: 104 mg/dL — ABNORMAL HIGH (ref 70–99)
Potassium: 4.3 mmol/L (ref 3.5–5.1)
Sodium: 142 mmol/L (ref 135–145)

## 2024-04-07 LAB — RESP PANEL BY RT-PCR (RSV, FLU A&B, COVID)  RVPGX2
Influenza A by PCR: NEGATIVE
Influenza B by PCR: NEGATIVE
Resp Syncytial Virus by PCR: NEGATIVE
SARS Coronavirus 2 by RT PCR: NEGATIVE

## 2024-04-07 LAB — CBC WITH DIFFERENTIAL/PLATELET
Abs Immature Granulocytes: 0.02 K/uL (ref 0.00–0.07)
Basophils Absolute: 0 K/uL (ref 0.0–0.1)
Basophils Relative: 1 %
Eosinophils Absolute: 0.4 K/uL (ref 0.0–0.5)
Eosinophils Relative: 6 %
HCT: 43.7 % (ref 39.0–52.0)
Hemoglobin: 13.9 g/dL (ref 13.0–17.0)
Immature Granulocytes: 0 %
Lymphocytes Relative: 31 %
Lymphs Abs: 2.2 K/uL (ref 0.7–4.0)
MCH: 31.3 pg (ref 26.0–34.0)
MCHC: 31.8 g/dL (ref 30.0–36.0)
MCV: 98.4 fL (ref 80.0–100.0)
Monocytes Absolute: 0.6 K/uL (ref 0.1–1.0)
Monocytes Relative: 8 %
Neutro Abs: 3.9 K/uL (ref 1.7–7.7)
Neutrophils Relative %: 54 %
Platelets: 172 K/uL (ref 150–400)
RBC: 4.44 MIL/uL (ref 4.22–5.81)
RDW: 12.4 % (ref 11.5–15.5)
WBC: 7.2 K/uL (ref 4.0–10.5)
nRBC: 0 % (ref 0.0–0.2)

## 2024-04-07 LAB — TROPONIN T, HIGH SENSITIVITY
Troponin T High Sensitivity: 54 ng/L — ABNORMAL HIGH (ref 0–19)
Troponin T High Sensitivity: 45 ng/L — ABNORMAL HIGH (ref 0–19)

## 2024-04-07 LAB — I-STAT VENOUS BLOOD GAS, ED
Acid-Base Excess: 6 mmol/L — ABNORMAL HIGH (ref 0.0–2.0)
Acid-Base Excess: 3 mmol/L — ABNORMAL HIGH (ref 0.0–2.0)
Bicarbonate: 34.2 mmol/L — ABNORMAL HIGH (ref 20.0–28.0)
Bicarbonate: 30.1 mmol/L — ABNORMAL HIGH (ref 20.0–28.0)
Calcium, Ion: 1.15 mmol/L (ref 1.15–1.40)
Calcium, Ion: 1.14 mmol/L — ABNORMAL LOW (ref 1.15–1.40)
HCT: 43 % (ref 39.0–52.0)
HCT: 39 % (ref 39.0–52.0)
Hemoglobin: 14.6 g/dL (ref 13.0–17.0)
Hemoglobin: 13.3 g/dL (ref 13.0–17.0)
O2 Saturation: 45 %
O2 Saturation: 77 %
Potassium: 4.2 mmol/L (ref 3.5–5.1)
Potassium: 3.7 mmol/L (ref 3.5–5.1)
Sodium: 143 mmol/L (ref 135–145)
Sodium: 142 mmol/L (ref 135–145)
TCO2: 36 mmol/L — ABNORMAL HIGH (ref 22–32)
TCO2: 32 mmol/L (ref 22–32)
pCO2, Ven: 61.6 mmHg — ABNORMAL HIGH (ref 44–60)
pCO2, Ven: 55.1 mmHg (ref 44–60)
pH, Ven: 7.352 (ref 7.25–7.43)
pH, Ven: 7.345 (ref 7.25–7.43)
pO2, Ven: 27 mmHg — CL (ref 32–45)
pO2, Ven: 45 mmHg (ref 32–45)

## 2024-04-07 LAB — I-STAT CHEM 8, ED
BUN: 20 mg/dL (ref 8–23)
Calcium, Ion: 1.14 mmol/L — ABNORMAL LOW (ref 1.15–1.40)
Chloride: 100 mmol/L (ref 98–111)
Creatinine, Ser: 1.3 mg/dL — ABNORMAL HIGH (ref 0.61–1.24)
Glucose, Bld: 98 mg/dL (ref 70–99)
HCT: 43 % (ref 39.0–52.0)
Hemoglobin: 14.6 g/dL (ref 13.0–17.0)
Potassium: 4.2 mmol/L (ref 3.5–5.1)
Sodium: 142 mmol/L (ref 135–145)
TCO2: 30 mmol/L (ref 22–32)

## 2024-04-07 LAB — I-STAT CG4 LACTIC ACID, ED: Lactic Acid, Venous: 2.3 mmol/L (ref 0.5–1.9)

## 2024-04-07 LAB — PRO BRAIN NATRIURETIC PEPTIDE: Pro Brain Natriuretic Peptide: 1027 pg/mL — ABNORMAL HIGH

## 2024-04-07 LAB — MAGNESIUM: Magnesium: 2.3 mg/dL (ref 1.7–2.4)

## 2024-04-07 MED ORDER — GUAIFENESIN ER 600 MG PO TB12
600.0000 mg | ORAL_TABLET | Freq: Two times a day (BID) | ORAL | Status: DC
  Administered 2024-04-07 – 2024-04-09 (×5): 600 mg via ORAL
  Filled 2024-04-07 (×5): qty 1

## 2024-04-07 MED ORDER — POLYETHYLENE GLYCOL 3350 17 G PO PACK: 17.0000 g | PACK | Freq: Every day | ORAL | Status: DC | PRN

## 2024-04-07 MED ORDER — ATORVASTATIN CALCIUM 80 MG PO TABS
80.0000 mg | ORAL_TABLET | Freq: Every day | ORAL | Status: DC
  Administered 2024-04-07 – 2024-04-09 (×3): 80 mg via ORAL
  Filled 2024-04-07: qty 1
  Filled 2024-04-07: qty 2
  Filled 2024-04-07: qty 1

## 2024-04-07 MED ORDER — PANTOPRAZOLE SODIUM 40 MG PO TBEC
40.0000 mg | DELAYED_RELEASE_TABLET | Freq: Every day | ORAL | Status: DC
  Administered 2024-04-07 – 2024-04-09 (×3): 40 mg via ORAL
  Filled 2024-04-07 (×3): qty 1

## 2024-04-07 MED ORDER — CLOPIDOGREL BISULFATE 75 MG PO TABS
75.0000 mg | ORAL_TABLET | Freq: Every day | ORAL | Status: DC
  Administered 2024-04-07 – 2024-04-09 (×3): 75 mg via ORAL
  Filled 2024-04-07 (×3): qty 1

## 2024-04-07 MED ORDER — BUDESONIDE 0.25 MG/2ML IN SUSP
0.2500 mg | Freq: Two times a day (BID) | RESPIRATORY_TRACT | Status: DC
  Administered 2024-04-07 – 2024-04-09 (×4): 0.25 mg via RESPIRATORY_TRACT
  Filled 2024-04-07 (×5): qty 2

## 2024-04-07 MED ORDER — ALBUTEROL SULFATE (2.5 MG/3ML) 0.083% IN NEBU: 2.5000 mg | INHALATION_SOLUTION | RESPIRATORY_TRACT | Status: DC | PRN

## 2024-04-07 MED ORDER — ALBUTEROL SULFATE (2.5 MG/3ML) 0.083% IN NEBU
2.5000 mg | INHALATION_SOLUTION | Freq: Once | RESPIRATORY_TRACT | Status: AC
  Administered 2024-04-07: 2.5 mg via RESPIRATORY_TRACT
  Filled 2024-04-07: qty 3

## 2024-04-07 MED ORDER — EZETIMIBE 10 MG PO TABS
10.0000 mg | ORAL_TABLET | Freq: Every day | ORAL | Status: DC
  Administered 2024-04-07 – 2024-04-09 (×3): 10 mg via ORAL
  Filled 2024-04-07 (×3): qty 1

## 2024-04-07 MED ORDER — PREDNISONE 20 MG PO TABS
40.0000 mg | ORAL_TABLET | Freq: Every day | ORAL | Status: DC
  Administered 2024-04-07 – 2024-04-09 (×3): 40 mg via ORAL
  Filled 2024-04-07 (×3): qty 2

## 2024-04-07 MED ORDER — SENNOSIDES-DOCUSATE SODIUM 8.6-50 MG PO TABS
1.0000 | ORAL_TABLET | Freq: Two times a day (BID) | ORAL | Status: DC
  Administered 2024-04-07 – 2024-04-09 (×4): 1 via ORAL
  Filled 2024-04-07 (×4): qty 1

## 2024-04-07 MED ORDER — MIDODRINE HCL 5 MG PO TABS
5.0000 mg | ORAL_TABLET | Freq: Two times a day (BID) | ORAL | Status: DC
  Administered 2024-04-07 (×2): 5 mg via ORAL
  Filled 2024-04-07 (×3): qty 1

## 2024-04-07 MED ORDER — ENOXAPARIN SODIUM 40 MG/0.4ML IJ SOSY
40.0000 mg | PREFILLED_SYRINGE | INTRAMUSCULAR | Status: DC
  Administered 2024-04-07 – 2024-04-09 (×3): 40 mg via SUBCUTANEOUS
  Filled 2024-04-07 (×4): qty 0.4

## 2024-04-07 MED ORDER — FUROSEMIDE 10 MG/ML IJ SOLN
40.0000 mg | Freq: Once | INTRAMUSCULAR | Status: AC
  Administered 2024-04-07: 40 mg via INTRAVENOUS
  Filled 2024-04-07: qty 4

## 2024-04-07 MED ORDER — DOCUSATE SODIUM 100 MG PO CAPS
100.0000 mg | ORAL_CAPSULE | Freq: Two times a day (BID) | ORAL | Status: DC
  Administered 2024-04-07 – 2024-04-09 (×4): 100 mg via ORAL
  Filled 2024-04-07 (×4): qty 1

## 2024-04-07 MED ORDER — ACETAMINOPHEN 325 MG PO TABS
650.0000 mg | ORAL_TABLET | Freq: Four times a day (QID) | ORAL | Status: DC | PRN
  Administered 2024-04-07 – 2024-04-09 (×4): 650 mg via ORAL
  Filled 2024-04-07 (×5): qty 2

## 2024-04-07 MED ORDER — OXYCODONE HCL 5 MG PO TABS
5.0000 mg | ORAL_TABLET | Freq: Four times a day (QID) | ORAL | Status: DC | PRN
  Administered 2024-04-07 – 2024-04-09 (×2): 5 mg via ORAL
  Filled 2024-04-07 (×2): qty 1

## 2024-04-07 MED ORDER — IPRATROPIUM-ALBUTEROL 0.5-2.5 (3) MG/3ML IN SOLN
3.0000 mL | Freq: Three times a day (TID) | RESPIRATORY_TRACT | Status: DC
  Administered 2024-04-07 – 2024-04-09 (×6): 3 mL via RESPIRATORY_TRACT
  Filled 2024-04-07 (×6): qty 3

## 2024-04-07 NOTE — TOC Initial Note (Signed)
 Transition of Care [facility]) - Initial/Assessment Note    Patient Details  Name: [name] MRN: [id number] Date of Birth: 1945-06-30  [facility] CM/SW Contact:    Luise JAYSON Pan, LCSWA Phone Number: 04/07/2024, 4:06 PM  Clinical Narrative:   Patient from [facility]. Patient can return when medically ready. CSW followed up with patients son and inquired if patient can return to [place], son stated yes.  CSW will continue to follow.              Expected Discharge Plan: Skilled Nursing Facility Barriers to Discharge: Continued Medical Work up   Patient Goals and CMS Choice Patient states their goals for this hospitalization and ongoing recovery are:: To return to ltc          Expected Discharge Plan and Services In-house Referral: Clinical Social Work   Post Acute Care Choice: Skilled Nursing Facility Living arrangements for the past 2 months: Skilled Nursing Facility                                      Prior Living Arrangements/Services Living arrangements for the past 2 months: Skilled Nursing Facility Lives with:: Facility Resident Patient language and need for interpreter reviewed:: Yes Do you feel safe going back to the place where you live?: Yes      Need for Family Participation in Patient Care: Yes (Comment) Care giver support system in place?: Yes (comment)   Criminal Activity/Legal Involvement Pertinent to Current Situation/Hospitalization: No - Comment as needed  Activities of Daily Living   ADL Screening (condition at time of admission) Independently performs ADLs?: Yes (appropriate for developmental age) Is the patient deaf or have difficulty hearing?: No Does the patient have difficulty seeing, even when wearing glasses/contacts?: No Does the patient have difficulty concentrating, remembering, or making decisions?: No  Permission Sought/Granted Permission sought to share information with : Facility Social Worker, Family Supports Permission granted to share information with : No (family contact info in chart)  Share Information with NAME: Chesley Glendia Blades, [phone]  Permission granted to share info w AGENCY: SNF ltc        Emotional Assessment Appearance:: Appears stated age Attitude/Demeanor/Rapport: Unable to Assess Affect (typically observed): Unable to Assess Orientation: :  (Unable to assess) Alcohol / Substance Use: Not Applicable Psych Involvement: No (comment)  Admission diagnosis:  COPD exacerbation (HCC) [J44.1] Acute on chronic respiratory failure with hypoxia (HCC) [J96.21] Acute on chronic congestive heart failure, unspecified heart failure type [facility]) [I50.9] Patient Active Problem List   Diagnosis Date Noted   QT prolongation 04/07/2024   Dysphagia 08/06/2023   Acute ischemic stroke (HCC) 08/06/2023   Cerebrovascular accident (CVA) (HCC) 08/05/2023   Fracture of femoral neck, left (HCC) 07/28/2023   History of CAD (coronary artery disease) 07/28/2023   History of AAA (abdominal aortic aneurysm) repair 07/28/2023   Acute on chronic respiratory failure with hypoxia and hypercapnia (HCC) 12/13/2022   COPD (chronic obstructive pulmonary disease) (HCC) 12/12/2022   Acute hypoxemic respiratory failure (HCC) 12/12/2022   Acute on chronic combined systolic and diastolic CHF (congestive heart failure) (HCC) 04/24/2021   Anemia 04/24/2021   Acute on chronic respiratory failure with hypoxia (HCC) 04/24/2021   Incarcerated inguinal hernia 03/18/2021   Acute on chronic HFrEF (heart failure with reduced ejection fraction) (HCC)    Fall at home, initial encounter 02/21/2021   History  of stroke 02/21/2021   Elevated troponin 02/21/2021   Other spondylosis with radiculopathy, lumbar region 09/04/2018   Centrilobular emphysema (HCC) 07/19/2018   Dilated cardiomyopathy (HCC) 07/19/2018   Coronary artery disease of native artery of native heart with stable angina  pectoris 01/20/2018   AAA (abdominal aortic aneurysm) without rupture 10/30/2017   COPD exacerbation (HCC) 04/03/2015   Barrett's esophagus 05/20/2014   Constipation 05/20/2014   Arthritis of hand 05/07/2014   Pulmonary nodule 11/06/2011   Chronic hypoxic respiratory failure (HCC) 04/24/2011   Tobacco abuse 02/12/2011   CAD, NATIVE VESSEL/HLD 10/18/2008   COLONIC POLYPS 08/05/2008   Hyperlipidemia 08/05/2008   Essential hypertension 08/05/2008   GERD 08/05/2008   PCP:  Cleotilde Oneil FALCON, MD Pharmacy:   [facility] [place], [place] - [address] [place] [address] [place] [address] Phone: [phone] Fax: [phone]     Social Drivers of Health (SDOH) Social History: SDOH Screenings   Food Insecurity: No Food Insecurity (04/07/2024)  Housing: Low Risk (04/07/2024)  Transportation Needs: No Transportation Needs (04/07/2024)  Utilities: Not At Risk (08/08/2023)  Financial Resource Strain: Low Risk  (01/03/2023)   Received from [facility]  Social Connections: Socially Isolated (04/07/2024)  Tobacco Use: High Risk (03/04/2024)   SDOH Interventions:     Readmission Risk Interventions     No data to display

## 2024-04-07 NOTE — ED Notes (Signed)
 CCMD called; Pt. Is on monitor

## 2024-04-07 NOTE — ED Provider Notes (Signed)
 " [place] EMERGENCY DEPARTMENT AT [facility] Provider Note   CSN: [id number] Arrival date & time: 04/07/24  0151     Patient presents with: Shortness of Breath   [name] is a 78 y.o. male.   HPI Patient presents for shortness of breath.  Medical history includes COPD, CAD, HTN, HLD, GERD, AAA, CVA.  Per chart review, last echocardiogram was 4 days ago.  This showed LVEF of 30 to 35%.  Patient is prescribed Lasix .  He arrives today via EMS from nursing facility.  Per EMS, he recently completed a course of antibiotics for treatment of pneumonia.  He had onset of shortness of breath today.  He has had a cough productive of yellow sputum.  At baseline, he wears 3 L of supplemental oxygen .  EMS noted SpO2 of 94% on his baseline oxygen .  He did have increased work of breathing and diffuse rhonchi on lung auscultation.  He was given 2 breathing treatments and 125 mg of Solu-Medrol .  His work of breathing has improved.    Prior to Admission medications  Medication Sig Start Date End Date Taking? Authorizing Provider  acetaminophen  (TYLENOL ) 325 MG tablet Take 2 tablets (650 mg total) by mouth every 6 (six) hours as needed for mild pain (or Fever >/= 101). 04/28/21  Yes Elgergawy, Brayton RAMAN, MD  albuterol  (PROVENTIL ) (2.5 MG/3ML) 0.083% nebulizer solution Take 2.5 mg by nebulization every 6 (six) hours as needed. 10/09/22  Yes [provider]  atorvastatin  (LIPITOR ) 80 MG tablet Take 1 tablet (80 mg total) by mouth daily. 06/28/23  Yes Hackney, Ellouise A, FNP  clopidogrel  (PLAVIX ) 75 MG tablet Take 1 tablet (75 mg total) by mouth daily. 08/08/23  Yes Dennise Lavada POUR, MD  dapagliflozin  propanediol (FARXIGA ) 10 MG TABS tablet Take 1 tablet (10 mg total) by mouth daily. 06/28/23  Yes Hackney, Tina A, FNP  docusate sodium  (COLACE) 100 MG capsule Take 100 mg by mouth 2 (two) times daily.   Yes [provider]  ezetimibe  (ZETIA ) 10 MG tablet Take 1 tablet (10 mg total) by  mouth daily. 08/08/23  Yes Dennise Lavada POUR, MD  furosemide  (LASIX ) 20 MG tablet Take 20 mg by mouth See admin instructions. Take one tablet by mouth on Sunday, Tuesday, Wednesday, Thursday, and Saturday.   Yes [provider]  furosemide  (LASIX ) 40 MG tablet Take 40 mg by mouth 2 (two) times a week. Take one tablet my mouth on Monday and Friday. 08/02/23  Yes [provider]  ipratropium-albuterol  (DUONEB) 0.5-2.5 (3) MG/3ML SOLN Use twice a day scheduled and every 4 hours as needed for shortness of breath and wheezing Patient taking differently: Inhale 3 mLs into the lungs every 4 (four) hours as needed (for shortness of breath and wheezing). 07/07/23  Yes Singh, Prashant K, MD  lactose free nutrition (BOOST) LIQD Take 237 mLs by mouth 2 (two) times daily between meals.   Yes [provider]  Melatonin 10 MG TABS Take 10 mg by mouth at bedtime.   Yes [provider]  midodrine  (PROAMATINE ) 5 MG tablet Take 1 tablet (5 mg total) by mouth 2 (two) times daily with a meal. 07/11/23  Yes Singh, Prashant K, MD  Multiple Vitamins-Minerals (MULTIVITAMIN WITH MINERALS) tablet Take 1 tablet by mouth daily.   Yes [provider]  omeprazole  (PRILOSEC) 40 MG capsule TAKE 1 CAPSULE BY MOUTH DAILY USUALLY BEFORE BREAKFAST 04/07/21  Yes Medina-Vargas, Monina C, NP  senna-docusate (SENOKOT-S) 8.6-50 MG  tablet Take 1 tablet by mouth 2 (two) times daily.   Yes [provider]  spironolactone  (ALDACTONE ) 25 MG tablet Take 0.5 tablets (12.5 mg total) by mouth daily. FOR CHRONIC COMBINED CHF 06/28/23  Yes [place] A, FNP  tiotropium (SPIRIVA  HANDIHALER) 18 MCG inhalation capsule Place 1 capsule (18 mcg total) into inhaler and inhale daily. 07/07/23 07/06/24 Yes Dennise Lavada POUR, MD  albuterol  (VENTOLIN  HFA) 108 (90 Base) MCG/ACT inhaler Inhale 2 puffs into the lungs every 6 (six) hours as needed for wheezing. Patient not taking: Reported on 04/07/2024  04/07/21 11/30/24  Medina-Vargas, Monina C, NP  amoxicillin -clavulanate (AUGMENTIN ) 875-125 MG tablet Take 1 tablet by mouth 2 (two) times daily. Patient not taking: Reported on 04/07/2024 04/03/24   [provider]    Allergies: Lyrica [pregabalin]    Review of Systems  Constitutional:  Positive for fatigue.  Respiratory:  Positive for cough and shortness of breath.   All other systems reviewed and are negative.   Updated Vital Signs BP (!) 103/55   Pulse 81   Temp 98.2 F (36.8 C) (Temporal)   Resp 17   SpO2 100%   Physical Exam Vitals and nursing note reviewed.  Constitutional:      General: He is not in acute distress.    Appearance: He is well-developed. He is not toxic-appearing or diaphoretic.  HENT:     Head: Normocephalic and atraumatic.  Eyes:     Conjunctiva/sclera: Conjunctivae normal.  Cardiovascular:     Rate and Rhythm: Regular rhythm.     Heart sounds: No murmur heard. Pulmonary:     Effort: Pulmonary effort is normal. Tachypnea present. No respiratory distress.     Breath sounds: Wheezing and rhonchi present.  Abdominal:     Palpations: Abdomen is soft.     Tenderness: There is no abdominal tenderness.  Musculoskeletal:        General: No swelling. Normal range of motion.     Cervical back: Normal range of motion and neck supple.  Skin:    General: Skin is warm and dry.     Coloration: Skin is not cyanotic or pale.  Neurological:     General: No focal deficit present.     Mental Status: He is alert and oriented to person, place, and time.  Psychiatric:        Mood and Affect: Mood normal.        Behavior: Behavior normal.     (all labs ordered are listed, but only abnormal results are displayed) Labs Reviewed  BASIC METABOLIC PANEL WITH GFR - Abnormal; Notable for the following components:      Result Value   Glucose, Bld 104 (*)    Creatinine, Ser 1.27 (*)    GFR, Estimated 57 (*)    All other components within normal limits  PRO  BRAIN NATRIURETIC PEPTIDE - Abnormal; Notable for the following components:   Pro Brain Natriuretic Peptide 1,027.0 (*)    All other components within normal limits  I-STAT CHEM 8, ED - Abnormal; Notable for the following components:   Creatinine, Ser 1.30 (*)    Calcium , Ion 1.14 (*)    All other components within normal limits  I-STAT VENOUS BLOOD GAS, ED - Abnormal; Notable for the following components:   pCO2, Ven 61.6 (*)    pO2, Ven 27 (*)    Bicarbonate 34.2 (*)    TCO2 36 (*)    Acid-Base Excess 6.0 (*)    All other components  within normal limits  I-STAT VENOUS BLOOD GAS, ED - Abnormal; Notable for the following components:   Bicarbonate 30.1 (*)    Acid-Base Excess 3.0 (*)    Calcium , Ion 1.14 (*)    All other components within normal limits  I-STAT CG4 LACTIC ACID, ED - Abnormal; Notable for the following components:   Lactic Acid, Venous 2.3 (*)    All other components within normal limits  TROPONIN T, HIGH SENSITIVITY - Abnormal; Notable for the following components:   Troponin T High Sensitivity 54 (*)    All other components within normal limits  RESP PANEL BY RT-PCR (RSV, FLU A&B, COVID)  RVPGX2  CBC WITH DIFFERENTIAL/PLATELET  MAGNESIUM     EKG: None  Radiology: DG Chest Port 1 View Result Date: 04/07/2024 CLINICAL DATA:  Shortness of breath EXAM: PORTABLE CHEST 1 VIEW COMPARISON:  09/20/2023 FINDINGS: Cardiac shadow is within normal limits. Aortic calcifications are noted. Lungs are well aerated bilaterally. No focal infiltrate or effusion is noted. No bony abnormality is noted. IMPRESSION: No active disease. Electronically Signed   By: Oneil Devonshire M.D.   On: 04/07/2024 02:29     Procedures   Medications Ordered in the ED  albuterol  (PROVENTIL ) (2.5 MG/3ML) 0.083% nebulizer solution 2.5 mg (2.5 mg Nebulization Given 04/07/24 0222)  furosemide  (LASIX ) injection 40 mg (40 mg Intravenous Given 04/07/24 0319)                                    Medical  Decision Making Amount and/or Complexity of Data Reviewed Labs: ordered. Radiology: ordered.  Risk Prescription drug management. Decision regarding hospitalization.   This patient presents to the ED for concern of shortness of breath, this involves an extensive number of treatment options, and is a complaint that carries with it a high risk of complications and morbidity.  The differential diagnosis includes CHF, reactive airway disease, pneumonia, anemia, acidosis, pleural effusion, pulmonary edema   Co morbidities / Chronic conditions that complicate the patient evaluation  COPD, CAD, HTN, HLD, GERD, AAA, CVA   Additional history obtained:  Additional history obtained from EMR External records from outside source obtained and reviewed including EMS   Lab Tests:  I Ordered, and personally interpreted labs.  The pertinent results include: Normal hemoglobin, no leukocytosis, creatinine slightly increased from baseline, elevations in troponin and BNP suggestive of CHF exacerbation.  Compensated hypercarbia on blood gas.   Imaging Studies ordered:  I ordered imaging studies including chest x-ray I independently visualized and interpreted imaging which showed no acute findings I agree with the radiologist interpretation   Cardiac Monitoring: / EKG:  The patient was maintained on a cardiac monitor.  I personally viewed and interpreted the cardiac monitored which showed an underlying rhythm of: Sinus rhythm   Problem List / ED Course / Critical interventions / Medication management  Patient presenting for cough and shortness of breath.  This reportedly began within the last 24 hours.  He received 2 breathing treatments and 125 mg of Solu-Medrol  with EMS.  On arrival, patient is tachypneic with mildly increased work of breathing.  He does have continued wheezing and rhonchi on auscultation.  Patient was placed on monitor.  Workup was initiated.  Patient's lab work notable for  elevations in troponin and BNP suggestive of CHF exacerbation.  Dose of Lasix  was ordered.  Blood gas showed compensated hypercarbia.  On reassessment, patient's work of breathing has improved.  He does have increased oxygen  requirement from baseline.  Patient to be admitted for further management. I ordered medication including albuterol  for COPD; Lasix  for diuresis Reevaluation of the patient after these medicines showed that the patient improved I have reviewed the patients home medicines and have made adjustments as needed  Social Determinants of Health:  Resides in nursing facility  CRITICAL CARE Performed by: Bernardino Fireman   Total critical care time: 31 minutes  Critical care time was exclusive of separately billable procedures and treating other patients.  Critical care was necessary to treat or prevent imminent or life-threatening deterioration.  Critical care was time spent personally by me on the following activities: development of treatment plan with patient and/or surrogate as well as nursing, discussions with consultants, evaluation of patient's response to treatment, examination of patient, obtaining history from patient or surrogate, ordering and performing treatments and interventions, ordering and review of laboratory studies, ordering and review of radiographic studies, pulse oximetry and re-evaluation of patient's condition.     Final diagnoses:  Acute on chronic congestive heart failure, unspecified heart failure type (HCC)  COPD exacerbation (HCC)  Acute on chronic respiratory failure with hypoxia [facility])    ED Discharge Orders     None          Fireman Bernardino, MD 04/07/24 [phone]  "

## 2024-04-07 NOTE — Plan of Care (Signed)
  Problem: Education: Goal: Knowledge of General Education information will improve Description: Including pain rating scale, medication(s)/side effects and non-pharmacologic comfort measures Outcome: Progressing   Problem: Clinical Measurements: Goal: Ability to maintain clinical measurements within normal limits will improve Outcome: Progressing   Problem: Coping: Goal: Level of anxiety will decrease Outcome: Progressing   

## 2024-04-07 NOTE — Progress Notes (Signed)
 [facility]  PROGRESS NOTE  [name] FMW:[id number] DOB: [birth year] DOA: 04/07/2024 PCP: Nathaniel Oneil FALCON, MD   Brief HPI:   78 y.o. male with medical history significant of COPD, chronic hypoxemic respiratory failure 4-6 L home oxygen , AAA status post endovascular repair, HFrEF, CAD, chronic hypotension on midodrine , hyperlipidemia, PVD, CVA, tobacco abuse, GERD, history of prostate cancer presents to the ED via EMS from his nursing facility for evaluation of shortness of breath, cough, and wheezing/rhonchi.  Per EMS, patient recently completed a course of antibiotics for treatment of pneumonia.  Patient was given 2 DuoNeb treatments and Solu-Medrol  125 mg prior to arrival with relief.  Vital signs on arrival to the ED: Temperature 97.5 F, pulse 84, respiratory rate 23, blood pressure 136/62, and SpO2 100% on 6 L supplemental oxygen .  EKG showing sinus rhythm, PACs, QTc 498, and no STEMI.  Labs showing no leukocytosis, proBNP 1027, troponin 54, COVID/influenza/RSV PCR negative.  Initial VBG showing pH 7.35 and pCO2 61.6.  Chest x-ray showing no active disease.  Patient was given albuterol  neb and IV Lasix  40 mg.  Repeat VBG showing improvement of pCO2 to 55.1.  TRH called to admit.   Patient is reporting 3-week history of progressively worsening dyspnea and productive cough.  Denies fevers or chest pain.  He is chronically on 3 L supplemental oxygen  24/7.  Stopped smoking cigarettes a year ago.  He is not sure which medications he is receiving at his nursing facility.  Reports chronic constipation for which he receives a laxative/stool softener at his nursing facility and denies nausea, vomiting, or abdominal pain.  He is not able to give any additional history.      Assessment/Plan:     Acute on chronic hypoxemic hypercarbic respiratory failure secondary to COPD exacerbation - Presented with shortness of breath, cough and wheezing -Recently completed course of antibiotics for  treatment of pneumonia -Chronically on 3 L of oxygen  at home; ABG showed improvement of hypercarbia  -Continue treatment with prednisone  40 mg daily,  DuoNeb every 8 hours, albuterol  neb every 4 hours PRN, and Pulmicort  neb twice daily.   Mucinex , flutter valve.  Continue supplemental oxygen .   Acute on chronic HFrEF -proBNP 1027, chest x-ray not suggestive of pulmonary edema -Echocardiogram 4 days ago showed EF of 30 to 35%, stable since prior echo in April 2025 -Received Lasix  40 mg IV in the ED -Lasix  on hold due to soft BP -Monitor intake and output   Elevated troponin History of CAD -Suspect elevated troponin is due to demand ischemia.   -EKG without STEMI and troponin 54.  Patient denies chest pain.  Trend troponin.   QT prolongation Potassium and magnesium  within normal range.  Avoid QT prolonging drugs if possible.   Chronic hypotension Continue midodrine .   Hyperlipidemia Continue Lipitor  and Zetia .   PVD History of CVA Continue Plavix , Lipitor , and Zetia .   GERD Continue PPI.   Chronic constipation Continue Colace and Senokot-S.  MiraLAX  PRN.      DVT prophylaxis: Lovenox    Medications     atorvastatin   80 mg Oral Daily   budesonide  (PULMICORT ) nebulizer solution  0.25 mg Nebulization BID   clopidogrel   75 mg Oral Daily   docusate sodium   100 mg Oral BID   enoxaparin  (LOVENOX ) injection  40 mg Subcutaneous Q24H   ezetimibe   10 mg Oral Daily   guaiFENesin   600 mg Oral BID   ipratropium-albuterol   3 mL Nebulization Q8H   midodrine   5 mg  Oral BID WC   pantoprazole   40 mg Oral Daily   predniSONE   40 mg Oral Q breakfast   senna-docusate  1 tablet Oral BID     Data Reviewed:   CBG:  No results for input(s): GLUCAP in the last 168 hours.  SpO2: 97 % O2 Flow Rate (L/min): 4 L/min    Vitals:   04/07/24 0700 04/07/24 0707 04/07/24 0715 04/07/24 0730  BP: 130/80  117/61 (!) 123/95  Pulse: (!) 49  (!) 47 75  Resp: (!) 22  (!) 21 17  Temp:       TempSrc:      SpO2: 100%  99% 97%  Weight:  77.5 kg        Data Reviewed:  Basic Metabolic Panel: Recent Labs  Lab 04/07/24 0115 04/07/24 0218 04/07/24 0219 04/07/24 0415  NA 142 142 143 142  K 4.3 4.2 4.2 3.7  CL 102 100  --   --   CO2 31  --   --   --   GLUCOSE 104* 98  --   --   BUN 17 20  --   --   CREATININE 1.27* 1.30*  --   --   CALCIUM  8.9  --   --   --   MG 2.3  --   --   --     CBC: Recent Labs  Lab 04/07/24 0115 04/07/24 0218 04/07/24 0219 04/07/24 0415  WBC 7.2  --   --   --   NEUTROABS 3.9  --   --   --   HGB 13.9 14.6 14.6 13.3  HCT 43.7 43.0 43.0 39.0  MCV 98.4  --   --   --   PLT 172  --   --   --     LFT No results for input(s): AST, ALT, ALKPHOS, BILITOT, PROT, ALBUMIN in the last 168 hours.   Antibiotics: Anti-infectives (From admission, onward)    None        CONSULTS   Code Status: DNR  Family Communication: No family at bedside     Subjective   Denies shortness of breath   Objective    Physical Examination:   General-appears in no acute distress Heart-S1-S2, regular, no murmur auscultated Lungs-clear to auscultation bilaterally, no wheezing or crackles auscultated Abdomen-soft, nontender, no organomegaly Extremities-no edema in the lower extremities Neuro-alert, oriented x3, no focal deficit noted           [name]   [facility] If 7PM-7AM, please contact night-coverage at [email or web address], Office  [phone]   04/07/2024, 8:02 AM  LOS: 0 days

## 2024-04-07 NOTE — ED Notes (Signed)
 Pt found to be in a brief with no way to collect and measure urine.  Brief was wet.  Brief has been changed and a condom cath applied.   Pt is was satting 100% Room air.prior to breathing tx and still is now.  He is not on 02 at this time.  He is eating his breakfast. Morning meds have been given.

## 2024-04-07 NOTE — ED Notes (Signed)
 PT brief changed at this time. Repositioned for comfort.

## 2024-04-07 NOTE — H&P (Signed)
 " History and Physical    [name] FMW:[id number] DOB: December 06, 1945 DOA: 04/07/2024  PCP: Cleotilde Oneil FALCON, MD  Chief Complaint: Shortness of breath  HPI: [name] is a 78 y.o. male with medical history significant of COPD, chronic hypoxemic respiratory failure 4-6 L home oxygen , AAA status post endovascular repair, HFrEF, CAD, chronic hypotension on midodrine , hyperlipidemia, PVD, CVA, tobacco abuse, GERD, history of prostate cancer presents to the ED via EMS from his nursing facility for evaluation of shortness of breath, cough, and wheezing/rhonchi.  Per EMS, patient recently completed a course of antibiotics for treatment of pneumonia.  Patient was given 2 DuoNeb treatments and Solu-Medrol  125 mg prior to arrival with relief.  Vital signs on arrival to the ED: Temperature 97.5 F, pulse 84, respiratory rate 23, blood pressure 136/62, and SpO2 100% on 6 L supplemental oxygen .  EKG showing sinus rhythm, PACs, QTc 498, and no STEMI.  Labs showing no leukocytosis, proBNP 1027, troponin 54, COVID/influenza/RSV PCR negative.  Initial VBG showing pH 7.35 and pCO2 61.6.  Chest x-ray showing no active disease.  Patient was given albuterol  neb and IV Lasix  40 mg.  Repeat VBG showing improvement of pCO2 to 55.1.  TRH called to admit.  Patient is reporting 3-week history of progressively worsening dyspnea and productive cough.  Denies fevers or chest pain.  He is chronically on 3 L supplemental oxygen  24/7.  Stopped smoking cigarettes a year ago.  He is not sure which medications he is receiving at his nursing facility.  Reports chronic constipation for which he receives a laxative/stool softener at his nursing facility and denies nausea, vomiting, or abdominal pain.  He is not able to give any additional history.  Review of Systems:  Review of Systems  All other systems reviewed and are negative.   Past Medical History:  Diagnosis Date   AAA (abdominal aortic aneurysm)    a.  CTA 7/19: measured  4.5 x 5.3 cm and greatest transverse dimensions   Abnormal nuclear stress test    a.  Myoview  2012: anterior wall ischemia with an estimated EF of 26%.  This was a new wall motion abnormality as well as a newly reduced EF   Brain bleed (HCC)    COPD (chronic obstructive pulmonary disease) (HCC)    Coronary artery disease    a.  Posterior MI in 2002 status post BMS to the LCx; b. LHC 2012: 95% stenosis of the proximal LAD, 95% stenosis of the mid LAD, 30% in-stent restenosis of the mid left circumflex with a second lesion of diffuse 50% stenosis.  The patient underwent successful PCI/BMS to the mid LAD with 0% residual stenosis, LV gram not performed   GERD (gastroesophageal reflux disease)    History of kidney stones    History of prostate cancer    Hyperlipidemia    Hypertension    Myocardial infarction [facility])    Neuromuscular disorder (HCC)    Prostate cancer (HCC)    a.  Status post seeding   PVD (peripheral vascular disease)    Stroke [facility])     Past Surgical History:  Procedure Laterality Date   BACK SURGERY  2009/2010   x 2   BOWEL RESECTION N/A 03/18/2021   Procedure: SMALL BOWEL RESECTION;  Surgeon: Paola Dreama SAILOR, MD;  Location: [facility] OR;  Service: General;  Laterality: N/A;   CARDIAC CATHETERIZATION  2002   stent placement [place]   ENDOVASCULAR STENT GRAFT (AAA) N/A 01/02/2018   Procedure: ENDOVASCULAR  REPAIR/STENT GRAFT;  Surgeon: Marea Selinda RAMAN, MD;  Location: [facility] INVASIVE CV LAB;  Service: Cardiovascular;  Laterality: N/A;   FOOT SURGERY     bilateral    HERNIA REPAIR     bilateral    HIP ARTHROPLASTY Left 07/28/2023   Procedure: LEFT HIP HEMIARTHROPLASTY;  Surgeon: Germaine Redbird, MD;  Location: [facility] OR;  Service: Orthopedics;  Laterality: Left;   INGUINAL HERNIA REPAIR Left 03/18/2021   Procedure: INGUINAL HERNIA REPAIR;  Surgeon: Paola Dreama SAILOR, MD;  Location: [facility] OR;  Service: General;  Laterality: Left;   INSERTION OF MESH Left 03/18/2021   Procedure: INSERTION OF  MESH;  Surgeon: Paola Dreama SAILOR, MD;  Location: [facility] OR;  Service: General;  Laterality: Left;   KNEE SURGERY     right knee    PROSTATE SURGERY  09/2009   prostate implant   SPINE SURGERY     UMBILICAL HERNIA REPAIR N/A 03/18/2021   Procedure: UMBILICAL HERNIA REPAIR;  Surgeon: Paola Dreama SAILOR, MD;  Location: [facility] OR;  Service: General;  Laterality: N/A;     reports that he has been smoking cigarettes. He has a 14 pack-year smoking history. He has never used smokeless tobacco. He reports that he does not drink alcohol and does not use drugs.  Allergies[1]  Family History  Problem Relation Age of Onset   Heart attack Mother    Diabetes Mother    Heart disease Father    Lung cancer Daughter     Prior to Admission medications  Medication Sig Start Date End Date Taking? Authorizing Provider  acetaminophen  (TYLENOL ) 325 MG tablet Take 2 tablets (650 mg total) by mouth every 6 (six) hours as needed for mild pain (or Fever >/= 101). 04/28/21  Yes Elgergawy, Brayton RAMAN, MD  albuterol  (PROVENTIL ) (2.5 MG/3ML) 0.083% nebulizer solution Take 2.5 mg by nebulization every 6 (six) hours as needed. 10/09/22  Yes [provider]  atorvastatin  (LIPITOR ) 80 MG tablet Take 1 tablet (80 mg total) by mouth daily. 06/28/23  Yes Hackney, Ellouise A, FNP  clopidogrel  (PLAVIX ) 75 MG tablet Take 1 tablet (75 mg total) by mouth daily. 08/08/23  Yes Singh, Prashant K, MD  dapagliflozin  propanediol (FARXIGA ) 10 MG TABS tablet Take 1 tablet (10 mg total) by mouth daily. 06/28/23  Yes Hackney, Ellouise A, FNP  docusate sodium  (COLACE) 100 MG capsule Take 100 mg by mouth 2 (two) times daily.   Yes [provider]  ezetimibe  (ZETIA ) 10 MG tablet Take 1 tablet (10 mg total) by mouth daily. 08/08/23  Yes Dennise Lavada POUR, MD  furosemide  (LASIX ) 20 MG tablet Take 20 mg by mouth See admin instructions. Take one tablet by mouth on Sunday, Tuesday, Wednesday, Thursday, and Saturday.   Yes [provider]   furosemide  (LASIX ) 40 MG tablet Take 40 mg by mouth 2 (two) times a week. Take one tablet my mouth on Monday and Friday. 08/02/23  Yes [provider]  ipratropium-albuterol  (DUONEB) 0.5-2.5 (3) MG/3ML SOLN Use twice a day scheduled and every 4 hours as needed for shortness of breath and wheezing Patient taking differently: Inhale 3 mLs into the lungs every 4 (four) hours as needed (for shortness of breath and wheezing). 07/07/23  Yes Singh, Prashant K, MD  lactose free nutrition (BOOST) LIQD Take 237 mLs by mouth 2 (two) times daily between meals.   Yes [provider]  Melatonin 10 MG TABS Take 10 mg by mouth at bedtime.   Yes [provider]  midodrine  (  PROAMATINE ) 5 MG tablet Take 1 tablet (5 mg total) by mouth 2 (two) times daily with a meal. 07/11/23  Yes Singh, Prashant K, MD  Multiple Vitamins-Minerals (MULTIVITAMIN WITH MINERALS) tablet Take 1 tablet by mouth daily.   Yes [provider]  omeprazole  (PRILOSEC) 40 MG capsule TAKE 1 CAPSULE BY MOUTH DAILY USUALLY BEFORE BREAKFAST 04/07/21  Yes Medina-Vargas, Monina C, NP  senna-docusate (SENOKOT-S) 8.6-50 MG tablet Take 1 tablet by mouth 2 (two) times daily.   Yes [provider]  spironolactone  (ALDACTONE ) 25 MG tablet Take 0.5 tablets (12.5 mg total) by mouth daily. FOR CHRONIC COMBINED CHF 06/28/23  Yes [place] A, FNP  tiotropium (SPIRIVA  HANDIHALER) 18 MCG inhalation capsule Place 1 capsule (18 mcg total) into inhaler and inhale daily. 07/07/23 07/06/24 Yes Dennise Lavada POUR, MD  albuterol  (VENTOLIN  HFA) 108 (90 Base) MCG/ACT inhaler Inhale 2 puffs into the lungs every 6 (six) hours as needed for wheezing. Patient not taking: Reported on 04/07/2024 04/07/21 11/30/24  Medina-Vargas, Monina C, NP  amoxicillin -clavulanate (AUGMENTIN ) 875-125 MG tablet Take 1 tablet by mouth 2 (two) times daily. Patient not taking: Reported on 04/07/2024 04/03/24   [provider]    Physical  Exam: Vitals:   04/07/24 0245 04/07/24 0400 04/07/24 0415 04/07/24 0430  BP: (!) 125/59 (!) 124/58 (!) 104/44 115/71  Pulse: 87 87 86 90  Resp: (!) 21 17 19  (!) 21  Temp:      TempSrc:      SpO2: 100% 97% 97% 97%    Physical Exam Vitals reviewed.  Constitutional:      General: He is not in acute distress. HENT:     Head: Normocephalic and atraumatic.  Eyes:     Comments: Unable to examine as patient kept his eyes closed  Cardiovascular:     Rate and Rhythm: Normal rate and regular rhythm.  Pulmonary:     Effort: Pulmonary effort is normal. No respiratory distress.     Breath sounds: No wheezing, rhonchi or rales.  Abdominal:     General: Bowel sounds are normal. There is no distension.     Palpations: Abdomen is soft.     Tenderness: There is no abdominal tenderness. There is no guarding.  Musculoskeletal:     Cervical back: Normal range of motion.     Right lower leg: No edema.     Left lower leg: No edema.  Skin:    General: Skin is warm and dry.  Neurological:     General: No focal deficit present.     Mental Status: He is alert and oriented to person, place, and time.     Labs on Admission: I have personally reviewed following labs and imaging studies  CBC: Recent Labs  Lab 04/07/24 0115 04/07/24 0218 04/07/24 0219 04/07/24 0415  WBC 7.2  --   --   --   NEUTROABS 3.9  --   --   --   HGB 13.9 14.6 14.6 13.3  HCT 43.7 43.0 43.0 39.0  MCV 98.4  --   --   --   PLT 172  --   --   --    Basic Metabolic Panel: Recent Labs  Lab 04/07/24 0115 04/07/24 0218 04/07/24 0219 04/07/24 0415  NA 142 142 143 142  K 4.3 4.2 4.2 3.7  CL 102 100  --   --   CO2 31  --   --   --   GLUCOSE 104* 98  --   --  BUN 17 20  --   --   CREATININE 1.27* 1.30*  --   --   CALCIUM  8.9  --   --   --   MG 2.3  --   --   --    GFR: CrCl cannot be calculated (Unknown ideal weight.). Liver Function Tests: No results for input(s): AST, ALT, ALKPHOS, BILITOT, PROT,  ALBUMIN in the last 168 hours. No results for input(s): LIPASE, AMYLASE in the last 168 hours. No results for input(s): AMMONIA in the last 168 hours. Coagulation Profile: No results for input(s): INR, PROTIME in the last 168 hours. Cardiac Enzymes: No results for input(s): CKTOTAL, CKMB, CKMBINDEX, TROPONINI in the last 168 hours. BNP (last 3 results) Recent Labs    04/07/24 0115  PROBNP 1,027.0*   HbA1C: No results for input(s): HGBA1C in the last 72 hours. CBG: No results for input(s): GLUCAP in the last 168 hours. Lipid Profile: No results for input(s): CHOL, HDL, LDLCALC, TRIG, CHOLHDL, LDLDIRECT in the last 72 hours. Thyroid  Function Tests: No results for input(s): TSH, T4TOTAL, FREET4, T3FREE, THYROIDAB in the last 72 hours. Anemia Panel: No results for input(s): VITAMINB12, FOLATE, FERRITIN, TIBC, IRON, RETICCTPCT in the last 72 hours. Urine analysis:    Component Value Date/Time   COLORURINE YELLOW 09/20/2023 0444   APPEARANCEUR CLEAR 09/20/2023 0444   APPEARANCEUR Cloudy 04/21/2011 1418   LABSPEC 1.010 09/20/2023 0444   LABSPEC 1.013 04/21/2011 1418   PHURINE 7.0 09/20/2023 0444   GLUCOSEU >=500 (A) 09/20/2023 0444   GLUCOSEU Negative 04/21/2011 1418   HGBUR LARGE (A) 09/20/2023 0444   BILIRUBINUR NEGATIVE 09/20/2023 0444   BILIRUBINUR Negative 04/21/2011 1418   KETONESUR NEGATIVE 09/20/2023 0444   PROTEINUR NEGATIVE 09/20/2023 0444   NITRITE NEGATIVE 09/20/2023 0444   LEUKOCYTESUR NEGATIVE 09/20/2023 0444   LEUKOCYTESUR Negative 04/21/2011 1418    Radiological Exams on Admission: DG Chest Port 1 View Result Date: 04/07/2024 CLINICAL DATA:  Shortness of breath EXAM: PORTABLE CHEST 1 VIEW COMPARISON:  09/20/2023 FINDINGS: Cardiac shadow is within normal limits. Aortic calcifications are noted. Lungs are well aerated bilaterally. No focal infiltrate or effusion is noted. No bony abnormality is noted.  IMPRESSION: No active disease. Electronically Signed   By: Oneil Devonshire M.D.   On: 04/07/2024 02:29    Assessment and Plan  Acute on chronic hypoxemic hypercarbic respiratory failure secondary to COPD exacerbation Patient is here from a nursing facility and recently treated with a course of Augmentin  for pneumonia.  No fever or leukocytosis.  COVID/influenza/RSV PCR negative.  Chest x-ray showing no active disease.  Upon initial evaluation, patient was noted to have wheezing and rhonchi on exam.  He was given DuoNeb x 2 and Solu-Medrol  125 mg by EMS prior to arrival with relief.  In the ED, he received additional albuterol  neb treatment.  No longer wheezing on exam.  Patient is chronically on 3 L supplemental oxygen  and currently requiring 4 L to maintain oxygen  saturation in the 90s.  Repeat blood gas showing improvement of hypercarbia.  PE less likely given no tachycardia, chest pain, or clinical signs of DVT on exam.  Continue treatment with prednisone  40 mg daily, DuoNeb every 8 hours, albuterol  neb every 4 hours PRN, and Pulmicort  neb twice daily.  Mucinex , flutter valve.  Continue supplemental oxygen .  Acute on chronic HFrEF proBNP 1027 but chest x-ray not suggestive of pulmonary edema.  Recent echo done 4 days ago showing EF 30 to 35% (EF stable since prior echo done  in April 2025).  He was given IV Lasix  40 mg in the ED and blood pressure currently soft with systolic in the 90s.  Will hold additional diuretics at this time.  Monitor intake and output, daily weights.  Elevated troponin History of CAD Suspect elevated troponin is due to demand ischemia.  EKG without STEMI and troponin 54.  Patient denies chest pain.  Trend troponin.  QT prolongation Potassium and magnesium  within normal range.  Avoid QT prolonging drugs if possible.  Chronic hypotension Continue midodrine .  Hyperlipidemia Continue Lipitor  and Zetia .  PVD History of CVA Continue Plavix , Lipitor , and  Zetia .  GERD Continue PPI.  Chronic constipation Continue Colace and Senokot-S.  MiraLAX  PRN.  DVT prophylaxis: Lovenox  Code Status: DNR/DNI (discussed with the patient) Level of care: Progressive Care Unit Admission status: It is my clinical opinion that referral for OBSERVATION is reasonable and necessary in this patient based on the above information provided. The aforementioned taken together are felt to place the patient at high risk for further clinical deterioration. However, it is anticipated that the patient may be medically stable for discharge from the hospital within 24 to 48 hours.  Editha Ram MD [facility]  If 7PM-7AM, please contact night-coverage [email or web address]  04/07/2024, 6:04 AM       [1]  Allergies Allergen Reactions   Lyrica [Pregabalin] Swelling        "

## 2024-04-07 NOTE — ED Triage Notes (Signed)
 Patient arrived with EMS from [facility] , staff reported SOB with chest congestion/cough for several days , recently diagnosed with pneumonia and completed his antibiotic , received 2 doses of Duoneb treatment and Solumedrol 125 mg IV prior to arrival with relief.

## 2024-04-08 ENCOUNTER — Other Ambulatory Visit (HOSPITAL_COMMUNITY): Payer: Self-pay

## 2024-04-08 DIAGNOSIS — J441 Chronic obstructive pulmonary disease with (acute) exacerbation: Secondary | ICD-10-CM | POA: Diagnosis not present

## 2024-04-08 DIAGNOSIS — R7989 Other specified abnormal findings of blood chemistry: Secondary | ICD-10-CM | POA: Diagnosis not present

## 2024-04-08 DIAGNOSIS — I5023 Acute on chronic systolic (congestive) heart failure: Secondary | ICD-10-CM | POA: Diagnosis not present

## 2024-04-08 DIAGNOSIS — J9622 Acute and chronic respiratory failure with hypercapnia: Secondary | ICD-10-CM | POA: Diagnosis not present

## 2024-04-08 DIAGNOSIS — J9621 Acute and chronic respiratory failure with hypoxia: Secondary | ICD-10-CM | POA: Diagnosis not present

## 2024-04-08 LAB — CBC
HCT: 38.1 % — ABNORMAL LOW (ref 39.0–52.0)
Hemoglobin: 12.4 g/dL — ABNORMAL LOW (ref 13.0–17.0)
MCH: 31.4 pg (ref 26.0–34.0)
MCHC: 32.5 g/dL (ref 30.0–36.0)
MCV: 96.5 fL (ref 80.0–100.0)
Platelets: 179 K/uL (ref 150–400)
RBC: 3.95 MIL/uL — ABNORMAL LOW (ref 4.22–5.81)
RDW: 12.5 % (ref 11.5–15.5)
WBC: 10.6 K/uL — ABNORMAL HIGH (ref 4.0–10.5)
nRBC: 0 % (ref 0.0–0.2)

## 2024-04-08 LAB — BASIC METABOLIC PANEL WITH GFR
Anion gap: 9 (ref 5–15)
BUN: 22 mg/dL (ref 8–23)
CO2: 29 mmol/L (ref 22–32)
Calcium: 8.9 mg/dL (ref 8.9–10.3)
Chloride: 104 mmol/L (ref 98–111)
Creatinine, Ser: 1.35 mg/dL — ABNORMAL HIGH (ref 0.61–1.24)
GFR, Estimated: 53 mL/min — ABNORMAL LOW
Glucose, Bld: 117 mg/dL — ABNORMAL HIGH (ref 70–99)
Potassium: 4.2 mmol/L (ref 3.5–5.1)
Sodium: 141 mmol/L (ref 135–145)

## 2024-04-08 MED ORDER — BENZONATATE 100 MG PO CAPS
100.0000 mg | ORAL_CAPSULE | Freq: Three times a day (TID) | ORAL | Status: DC | PRN
  Administered 2024-04-08 – 2024-04-09 (×3): 100 mg via ORAL
  Filled 2024-04-08 (×3): qty 1

## 2024-04-08 MED ORDER — FUROSEMIDE 40 MG PO TABS
40.0000 mg | ORAL_TABLET | Freq: Every day | ORAL | Status: DC
  Administered 2024-04-08 – 2024-04-09 (×2): 40 mg via ORAL
  Filled 2024-04-08 (×2): qty 1

## 2024-04-08 NOTE — Progress Notes (Signed)
 Heart Failure Navigator Progress Note  Assessed for Heart & Vascular TOC clinic readiness.  Patient does not meet criteria due to he is seen by Ellouise at [facility] AHF clinic. He has a scheduled follow up appointment with Dr. Zenaida on 04/24/2024. .   Navigator will sign off at this time.   Stephane Haddock, BSN, Scientist, Clinical (histocompatibility And Immunogenetics) Only

## 2024-04-08 NOTE — Progress Notes (Signed)
 [facility]  PROGRESS NOTE  MERCURY ROCK FMW:[id number] DOB: 11/29/1945 DOA: 04/07/2024 PCP: Cleotilde Oneil FALCON, MD   Brief HPI:   78 y.o. male with medical history significant of COPD, chronic hypoxemic respiratory failure 4-6 L home oxygen , AAA status post endovascular repair, HFrEF, CAD, chronic hypotension on midodrine , hyperlipidemia, PVD, CVA, tobacco abuse, GERD, history of prostate cancer presents to the ED via EMS from his nursing facility for evaluation of shortness of breath, cough, and wheezing/rhonchi.  Per EMS, patient recently completed a course of antibiotics for treatment of pneumonia.  Patient was given 2 DuoNeb treatments and Solu-Medrol  125 mg prior to arrival with relief.  Vital signs on arrival to the ED: Temperature 97.5 F, pulse 84, respiratory rate 23, blood pressure 136/62, and SpO2 100% on 6 L supplemental oxygen .  EKG showing sinus rhythm, PACs, QTc 498, and no STEMI.  Labs showing no leukocytosis, proBNP 1027, troponin 54, COVID/influenza/RSV PCR negative.  Initial VBG showing pH 7.35 and pCO2 61.6.  Chest x-ray showing no active disease.  Patient was given albuterol  neb and IV Lasix  40 mg.  Repeat VBG showing improvement of pCO2 to 55.1.  TRH called to admit.   Patient is reporting 3-week history of progressively worsening dyspnea and productive cough.  Denies fevers or chest pain.  He is chronically on 3 L supplemental oxygen  24/7.  Stopped smoking cigarettes a year ago.  He is not sure which medications he is receiving at his nursing facility.  Reports chronic constipation for which he receives a laxative/stool softener at his nursing facility and denies nausea, vomiting, or abdominal pain.  He is not able to give any additional history.      Assessment/Plan:    Acute on chronic hypoxemic hypercarbic respiratory failure secondary to COPD exacerbation - Presented with shortness of breath, cough and wheezing -Recently completed course of antibiotics for treatment  of pneumonia -Chronically on 3 L of oxygen  at home; ABG showed improvement of hypercarbia  -Continue treatment with prednisone  40 mg daily,  DuoNeb every 8 hours, albuterol  neb every 4 hours PRN, and Pulmicort  neb twice daily.   Mucinex , flutter valve.  Continue supplemental oxygen .   Acute on chronic HFrEF -proBNP 1027, chest x-ray not suggestive of pulmonary edema -Echocardiogram 4 days ago showed EF of 30 to 35%, stable since prior echo in April 2025 -Received Lasix  40 mg IV in the ED -Lasix  was held due to soft BP -BP has now improved, will start Lasix  40 mg daily -Monitor intake and output   Elevated troponin History of CAD -Suspect elevated troponin is due to demand ischemia.   -EKG without STEMI and troponin 54.  Patient denies chest pain.  Trend troponin.   QT prolongation Potassium and magnesium  within normal range.  Avoid QT prolonging drugs if possible.   Chronic hypotension Continue midodrine .   Hyperlipidemia Continue Lipitor  and Zetia .   PVD History of CVA Continue Plavix , Lipitor , and Zetia .   GERD Continue PPI.   Chronic constipation Continue Colace and Senokot-S.  MiraLAX  PRN.      DVT prophylaxis: Lovenox    Medications     atorvastatin   80 mg Oral Daily   budesonide  (PULMICORT ) nebulizer solution  0.25 mg Nebulization BID   clopidogrel   75 mg Oral Daily   docusate sodium   100 mg Oral BID   enoxaparin  (LOVENOX ) injection  40 mg Subcutaneous Q24H   ezetimibe   10 mg Oral Daily   guaiFENesin   600 mg Oral BID   ipratropium-albuterol   3  mL Nebulization Q8H   midodrine   5 mg Oral BID WC   pantoprazole   40 mg Oral Daily   predniSONE   40 mg Oral Q breakfast   senna-docusate  1 tablet Oral BID     Data Reviewed:   CBG:  No results for input(s): GLUCAP in the last 168 hours.  SpO2: 95 % O2 Flow Rate (L/min): 3 L/min FiO2 (%): 32 %    Vitals:   04/07/24 2039 04/07/24 2300 04/08/24 0300 04/08/24 0745  BP:  99/60 123/71 (!) 125/57   Pulse:  76 73 (!) 42  Resp:  20 19 (!) 27  Temp:  98.3 F (36.8 C) 98.3 F (36.8 C) 97.7 F (36.5 C)  TempSrc:  Oral Oral Oral  SpO2: 99% (!) 86% 91% 95%  Weight:   72.6 kg   Height:   6' (1.829 m)       Data Reviewed:  Basic Metabolic Panel: Recent Labs  Lab 04/07/24 0115 04/07/24 0218 04/07/24 0219 04/07/24 0415 04/08/24 0359  NA 142 142 143 142 141  K 4.3 4.2 4.2 3.7 4.2  CL 102 100  --   --  104  CO2 31  --   --   --  29  GLUCOSE 104* 98  --   --  117*  BUN 17 20  --   --  22  CREATININE 1.27* 1.30*  --   --  1.35*  CALCIUM  8.9  --   --   --  8.9  MG 2.3  --   --   --   --     CBC: Recent Labs  Lab 04/07/24 0115 04/07/24 0218 04/07/24 0219 04/07/24 0415 04/08/24 0359  WBC 7.2  --   --   --  10.6*  NEUTROABS 3.9  --   --   --   --   HGB 13.9 14.6 14.6 13.3 12.4*  HCT 43.7 43.0 43.0 39.0 38.1*  MCV 98.4  --   --   --  96.5  PLT 172  --   --   --  179    LFT No results for input(s): AST, ALT, ALKPHOS, BILITOT, PROT, ALBUMIN in the last 168 hours.   Antibiotics: Anti-infectives (From admission, onward)    None        CONSULTS   Code Status: DNR  Family Communication: No family at bedside     Subjective   Patient seen and examined, complains of dry cough.   Objective    Physical Examination:   Appears in no acute distress S1-S2, regular Lungs-bilateral rhonchi auscultated Abdomen is soft, nontender, no organomegaly Extremities no edema          Asheton Viramontes S Sherrod Toothman   [facility] If 7PM-7AM, please contact night-coverage at [email or web address], Office  [phone]   04/08/2024, 8:06 AM  LOS: 0 days

## 2024-04-08 NOTE — Progress Notes (Signed)
 Mobility Specialist Progress Note:    04/08/24 1418  Mobility  Activity Dangled on edge of bed;Stood at bedside (Heel/Toe Raises, Leg Ext, Sitting Marches. Side Steps)  Level of Assistance Contact guard assist, steadying assist  Assistive Device Other (Comment) (HHA)  Distance Ambulated (ft) 3 ft  Range of Motion/Exercises Active  Activity Response Tolerated fair  Mobility Referral Yes  Mobility visit 1 Mobility  Mobility Specialist Start Time (ACUTE ONLY) 1418  Mobility Specialist Stop Time (ACUTE ONLY) 1430  Mobility Specialist Time Calculation (min) (ACUTE ONLY) 12 min   Received pt laying in bed agreeable to session. Pt able to transfer to EOB w/o assist. Pt able to perform movements and take steps towards the [facility] w/ HHA. Returned pt to bed w/ all needs met.   Venetia Keel Mobility Specialist Please Neurosurgeon or Rehab Office at [phone]

## 2024-04-08 NOTE — TOC Progression Note (Addendum)
 Transition of Care [facility]) - Progression Note    Patient Details  Name: [name] MRN: [id number] Date of Birth: 25-May-1945  [facility] of [facility]) CM/SW Contact  Luise JAYSON Pan, [place] Phone Number: 04/08/2024, 8:41 AM  Clinical Narrative:   Per Emmalene, they would like for CSW to submit for prior auth before patient discharges to facility. CSW inquired if shara is denied can patient still return, facility stated yes.  11:26 AM CSW started auth with Jori form [employer] at this time for SNF and PTAR.  CSW will continue to follow.    Expected Discharge Plan: Skilled Nursing Facility Barriers to Discharge: Continued Medical Work up               Expected Discharge Plan and Services In-house Referral: Clinical Social Work   Post Acute Care Choice: Skilled Nursing Facility Living arrangements for the past 2 months: Skilled Nursing Facility                                       Social Drivers of Health (SDOH) Interventions SDOH Screenings   Food Insecurity: No Food Insecurity (04/07/2024)  Housing: Low Risk (04/07/2024)  Transportation Needs: No Transportation Needs (04/07/2024)  Utilities: Not At Risk (04/08/2024)  Financial Resource Strain: Low Risk  (01/03/2023)   Received from [facility]  Social Connections: Socially Isolated (04/07/2024)  Tobacco Use: High Risk (03/04/2024)    Readmission Risk Interventions     No data to display

## 2024-04-08 NOTE — Care Management Obs Status (Signed)
 MEDICARE OBSERVATION STATUS NOTIFICATION   Patient Details  Name: [name] MRN: [id number] Date of Birth: [birth year]   Medicare Observation Status Notification Given:  Yes    Vonzell Arrie Sharps 04/08/2024, 9:32 AM

## 2024-04-08 NOTE — [place] FL2 (Signed)
 " [place]  MEDICAID FL2 LEVEL OF CARE FORM     IDENTIFICATION  Patient Name: [name] Birthdate: 11-01-1945 Sex: male Admission Date (Current Location): 04/07/2024  [facility] [facility] [place] Number:  Producer, Television/film/video and Address:  [facility], [address], [place], [place] [address]      Provider Number: [id number]  Attending Physician Name and Address:  Drusilla Sabas RAMAN, MD  Relative Name and Phone Number:  Chesley Glendia Blades, Emergency Contact  [phone]    Current Level of Care: Hospital Recommended Level of Care: Skilled Nursing Facility Prior Approval Number:    Date Approved/Denied:   PASRR Number: [id number]  Discharge Plan: SNF    Current Diagnoses: Patient Active Problem List   Diagnosis Date Noted   QT prolongation 04/07/2024   Dysphagia 08/06/2023   Acute ischemic stroke (HCC) 08/06/2023   Cerebrovascular accident (CVA) (HCC) 08/05/2023   Fracture of femoral neck, left (HCC) 07/28/2023   History of CAD (coronary artery disease) 07/28/2023   History of AAA (abdominal aortic aneurysm) repair 07/28/2023   Acute on chronic respiratory failure with hypoxia and hypercapnia (HCC) 12/13/2022   COPD (chronic obstructive pulmonary disease) (HCC) 12/12/2022   Acute hypoxemic respiratory failure (HCC) 12/12/2022   Acute on chronic combined systolic and diastolic CHF (congestive heart failure) (HCC) 04/24/2021   Anemia 04/24/2021   Acute on chronic respiratory failure with hypoxia (HCC) 04/24/2021   Incarcerated inguinal hernia 03/18/2021   Acute on chronic HFrEF (heart failure with reduced ejection fraction) (HCC)    Fall at home, initial encounter 02/21/2021   History of stroke 02/21/2021   Elevated troponin 02/21/2021   Other spondylosis with radiculopathy, lumbar region 09/04/2018   Centrilobular emphysema (HCC) 07/19/2018   Dilated cardiomyopathy (HCC) 07/19/2018   Coronary artery disease of native artery of native heart with  stable angina pectoris 01/20/2018   AAA (abdominal aortic aneurysm) without rupture 10/30/2017   COPD exacerbation (HCC) 04/03/2015   Barrett's esophagus 05/20/2014   Constipation 05/20/2014   Arthritis of hand 05/07/2014   Pulmonary nodule 11/06/2011   Chronic hypoxic respiratory failure (HCC) 04/24/2011   Tobacco abuse 02/12/2011   CAD, NATIVE VESSEL/HLD 10/18/2008   COLONIC POLYPS 08/05/2008   Hyperlipidemia 08/05/2008   Essential hypertension 08/05/2008   GERD 08/05/2008    Orientation RESPIRATION BLADDER Height & Weight     Self, Time, Situation, Place  O2 (3L O2 [place])   Weight: 160 lb 0.9 oz (72.6 kg) Height:  6' (182.9 cm)  BEHAVIORAL SYMPTOMS/MOOD NEUROLOGICAL BOWEL NUTRITION STATUS        Diet (Please see dsicharge summary)  AMBULATORY STATUS COMMUNICATION OF NEEDS Skin     Verbally Normal                       Personal Care Assistance Level of Assistance  Bathing, Feeding, Dressing Bathing Assistance: Maximum assistance Feeding assistance: Limited assistance Dressing Assistance: Maximum assistance     Functional Limitations Info  Sight, Hearing, Speech Sight Info: Impaired (Eyeglasses) Hearing Info: Adequate Speech Info: Adequate    SPECIAL CARE FACTORS FREQUENCY                       Contractures Contractures Info: Not present    Additional Factors Info  Code Status, Allergies Code Status Info: DNR limited Allergies Info: Lyrica (pregabalin)           Current Medications (04/08/2024):  This is the current hospital active  medication list Current Facility-Administered Medications  Medication Dose Route Frequency Provider Last Rate Last Admin   acetaminophen  (TYLENOL ) tablet 650 mg  650 mg Oral Q6H PRN Lama, Gagan S, MD   650 mg at 04/07/24 1045   albuterol  (PROVENTIL ) (2.5 MG/3ML) 0.083% nebulizer solution 2.5 mg  2.5 mg Nebulization Q4H PRN Alfornia Madison, MD       atorvastatin  (LIPITOR ) tablet 80 mg  80 mg Oral Daily Rathore,  Vasundhra, MD   80 mg at 04/07/24 9092   benzonatate  (TESSALON ) capsule 100 mg  100 mg Oral TID PRN Drusilla Sabas RAMAN, MD       budesonide  (PULMICORT ) nebulizer solution 0.25 mg  0.25 mg Nebulization BID Rathore, Vasundhra, MD   0.25 mg at 04/08/24 9167   clopidogrel  (PLAVIX ) tablet 75 mg  75 mg Oral Daily Rathore, Vasundhra, MD   75 mg at 04/07/24 0908   docusate sodium  (COLACE) capsule 100 mg  100 mg Oral BID Rathore, Vasundhra, MD   100 mg at 04/07/24 9092   enoxaparin  (LOVENOX ) injection 40 mg  40 mg Subcutaneous Q24H Rathore, Vasundhra, MD   40 mg at 04/07/24 9091   ezetimibe  (ZETIA ) tablet 10 mg  10 mg Oral Daily Rathore, Vasundhra, MD   10 mg at 04/07/24 9092   guaiFENesin  (MUCINEX ) 12 hr tablet 600 mg  600 mg Oral BID Rathore, Vasundhra, MD   600 mg at 04/07/24 2213   ipratropium-albuterol  (DUONEB) 0.5-2.5 (3) MG/3ML nebulizer solution 3 mL  3 mL Nebulization Q8H Alfornia Madison, MD   3 mL at 04/08/24 9367   midodrine  (PROAMATINE ) tablet 5 mg  5 mg Oral BID WC Rathore, Vasundhra, MD   5 mg at 04/07/24 1626   oxyCODONE  (Oxy IR/ROXICODONE ) immediate release tablet 5 mg  5 mg Oral Q6H PRN Lama, Gagan S, MD   5 mg at 04/07/24 1626   pantoprazole  (PROTONIX ) EC tablet 40 mg  40 mg Oral Daily Rathore, Vasundhra, MD   40 mg at 04/07/24 0907   polyethylene glycol (MIRALAX  / GLYCOLAX ) packet 17 g  17 g Oral Daily PRN Rathore, Vasundhra, MD       predniSONE  (DELTASONE ) tablet 40 mg  40 mg Oral Q breakfast Rathore, Vasundhra, MD   40 mg at 04/07/24 0908   senna-docusate (Senokot-S) tablet 1 tablet  1 tablet Oral BID Rathore, Vasundhra, MD   1 tablet at 04/07/24 0907     Discharge Medications: Please see discharge summary for a list of discharge medications.  Relevant Imaging Results:  Relevant Lab Results:   Additional Information SSN: [id number]  Luise JAYSON [place], [place]     "

## 2024-04-08 NOTE — Plan of Care (Signed)

## 2024-04-08 NOTE — Evaluation (Signed)
 Physical Therapy Evaluation Patient Details Name: [name] MRN: [id number] DOB: March 30, 1946 Today's Date: 04/08/2024  History of Present Illness  Pt is 78 yo presenting to [facility] on 12/22 due to COPD exacerbation.  PMH of COPD stage II, anxiety, stroke, neuromuscular disorder,depression, HTN, prostate cancer s/p seed implantation, HLD, lumbar disc disease, CAD, AAA , intraparenchymal hemorrhage in 02/2021, , nephrolithiasis, GERD, tobacco use and chronic ischemic heart failure.  Clinical Impression  Pt is currently presenting at Min A for bed mobility, Mod A for sit to stand and side stepping at EOB. Pt reports that he was receiving skilled physical therapy services at [facility] prior to hospitalization. Due to pt current functional status, home set up and available assistance at home recommending skilled physical therapy services < 3 hours/day in order to address strength, balance and functional mobility to decrease risk for falls, injury, immobility, skin break down and re-hospitalization.          If plan is discharge home, recommend the following: A lot of help with walking and/or transfers;Assist for transportation;Assistance with cooking/housework   Can travel by private vehicle   No    Equipment Recommendations None recommended by PT     Functional Status Assessment Patient has had a recent decline in their functional status and demonstrates the ability to make significant improvements in function in a reasonable and predictable amount of time.     Precautions / Restrictions Precautions Precautions: Fall Recall of Precautions/Restrictions: Intact Restrictions Weight Bearing Restrictions Per Provider Order: No      Mobility  Bed Mobility Overal bed mobility: Needs Assistance Bed Mobility: Supine to Sit, Sit to Supine     Supine to sit: Min assist Sit to supine: Min assist   General bed mobility comments: Min A for trunk to mid line and LE to EOB.     Transfers Overall transfer level: Needs assistance Equipment used: None Transfers: Sit to/from Stand Sit to Stand: Mod assist           General transfer comment: Posterior lean with Mod A for sit to stand from EOB with face to face standing transfer. Pt performed side steps at EOB demonstrating he can perform a stand pivot transfer from EOB to W/C which is what he has been doing at [facility].    Ambulation/Gait   General Gait Details: Pt currently is non-ambulatory.     Balance Overall balance assessment: Needs assistance Sitting-balance support: Single extremity supported, Feet supported Sitting balance-Leahy Scale: Fair   Postural control: Posterior lean Standing balance support: Bilateral upper extremity supported, During functional activity Standing balance-Leahy Scale: Poor Standing balance comment: Heavy posterior lean with Mod A for standing.       Pertinent Vitals/Pain Pain Assessment Pain Assessment: Faces Faces Pain Scale: Hurts a little bit Pain Location: head Pain Descriptors / Indicators: Aching Pain Intervention(s): Patient requesting pain meds-RN notified    Home Living Family/patient expects to be discharged to:: Skilled nursing facility     Additional Comments: Pt reports he is at long term rehab facility [facility].    Prior Function Prior Level of Function : Independent/Modified Independent         Mobility Comments: Pt reports he transfers with assist to W/C and mobilizes mostly in W/C. ADLs Comments: Pt reports he is able to assist but does get physical assist for all ADLs.     Extremity/Trunk Assessment   Upper Extremity Assessment Upper Extremity Assessment: Generalized weakness    Lower Extremity Assessment  Lower Extremity Assessment: Generalized weakness    Cervical / Trunk Assessment Cervical / Trunk Assessment: Kyphotic  Communication   Communication Communication: Impaired Factors Affecting Communication:  Hearing impaired    Cognition Arousal: Alert Behavior During Therapy: WFL for tasks assessed/performed   PT - Cognitive impairments: No apparent impairments     Following commands: Intact       Cueing Cueing Techniques: Verbal cues            Assessment/Plan    PT Assessment Patient needs continued PT services  PT Problem List Decreased strength;Decreased activity tolerance;Decreased balance;Decreased mobility       PT Treatment Interventions DME instruction;Therapeutic exercise;Gait training;Balance training;Functional mobility training;Neuromuscular re-education;Patient/family education;Therapeutic activities    PT Goals (Current goals can be found in the Care Plan section)  Acute Rehab PT Goals Patient Stated Goal: Return to facility and continue to work on mobility PT Goal Formulation: With patient Time For Goal Achievement: 04/22/24 Potential to Achieve Goals: Good Additional Goals Additional Goal #1: will wheel himself 50 ft with W/C at supervision level and Max A with w/c parts to improve independence.    Frequency Min 1X/week        AM-PAC PT 6 Clicks Mobility  Outcome Measure Help needed turning from your back to your side while in a flat bed without using bedrails?: A Little Help needed moving from lying on your back to sitting on the side of a flat bed without using bedrails?: A Little Help needed moving to and from a bed to a chair (including a wheelchair)?: A Lot Help needed standing up from a chair using your arms (e.g., wheelchair or bedside chair)?: A Lot Help needed to walk in hospital room?: Total Help needed climbing 3-5 steps with a railing? : Total 6 Click Score: 12    End of Session Equipment Utilized During Treatment: Gait belt Activity Tolerance: Patient tolerated treatment well Patient left: in bed;with call bell/phone within reach;with bed alarm set Nurse Communication: Mobility status PT Visit Diagnosis: Unsteadiness on feet  (R26.81);Other abnormalities of gait and mobility (R26.89);Muscle weakness (generalized) (M62.81)    Time: 9053-9041 PT Time Calculation (min) (ACUTE ONLY): 12 min   Charges:   PT Evaluation $PT Eval Low Complexity: 1 Low   PT General Charges $$ ACUTE PT VISIT: 1 Visit       Dorothyann Maier, DPT, CLT  Acute Rehabilitation Services Office: [phone] (Secure chat preferred)   Dorothyann VEAR Maier 04/08/2024, 11:23 AM

## 2024-04-09 DIAGNOSIS — J9621 Acute and chronic respiratory failure with hypoxia: Secondary | ICD-10-CM | POA: Diagnosis not present

## 2024-04-09 DIAGNOSIS — I5023 Acute on chronic systolic (congestive) heart failure: Secondary | ICD-10-CM | POA: Diagnosis not present

## 2024-04-09 DIAGNOSIS — J441 Chronic obstructive pulmonary disease with (acute) exacerbation: Secondary | ICD-10-CM | POA: Diagnosis not present

## 2024-04-09 DIAGNOSIS — J9622 Acute and chronic respiratory failure with hypercapnia: Secondary | ICD-10-CM | POA: Diagnosis not present

## 2024-04-09 LAB — BASIC METABOLIC PANEL WITH GFR
Anion gap: 8 (ref 5–15)
BUN: 27 mg/dL — ABNORMAL HIGH (ref 8–23)
CO2: 31 mmol/L (ref 22–32)
Calcium: 8.7 mg/dL — ABNORMAL LOW (ref 8.9–10.3)
Chloride: 99 mmol/L (ref 98–111)
Creatinine, Ser: 1.39 mg/dL — ABNORMAL HIGH (ref 0.61–1.24)
GFR, Estimated: 51 mL/min — ABNORMAL LOW
Glucose, Bld: 148 mg/dL — ABNORMAL HIGH (ref 70–99)
Potassium: 3.9 mmol/L (ref 3.5–5.1)
Sodium: 138 mmol/L (ref 135–145)

## 2024-04-09 MED ORDER — GUAIFENESIN ER 600 MG PO TB12: 600.0000 mg | ORAL_TABLET | Freq: Two times a day (BID) | ORAL | Status: AC

## 2024-04-09 MED ORDER — IPRATROPIUM-ALBUTEROL 0.5-2.5 (3) MG/3ML IN SOLN: 3.0000 mL | Freq: Two times a day (BID) | RESPIRATORY_TRACT | Status: DC

## 2024-04-09 MED ORDER — BENZONATATE 100 MG PO CAPS: 100.0000 mg | ORAL_CAPSULE | Freq: Three times a day (TID) | ORAL | Status: AC | PRN

## 2024-04-09 NOTE — Plan of Care (Signed)
 " Problem: Education: Goal: Knowledge of General Education information will improve Description: Including pain rating scale, medication(s)/side effects and non-pharmacologic comfort measures 04/09/2024 1052 by Norine Colman BIRCH, RN Outcome: Adequate for Discharge 04/09/2024 1051 by Norine Colman BIRCH, RN Outcome: Progressing   Problem: Health Behavior/Discharge Planning: Goal: Ability to manage health-related needs will improve 04/09/2024 1052 by Norine Colman BIRCH, RN Outcome: Adequate for Discharge 04/09/2024 1051 by Norine Colman BIRCH, RN Outcome: Progressing   Problem: Clinical Measurements: Goal: Ability to maintain clinical measurements within normal limits will improve 04/09/2024 1052 by Norine Colman BIRCH, RN Outcome: Adequate for Discharge 04/09/2024 1051 by Norine Colman BIRCH, RN Outcome: Progressing Goal: Will remain free from infection 04/09/2024 1052 by Norine Colman BIRCH, RN Outcome: Adequate for Discharge 04/09/2024 1051 by Norine Colman BIRCH, RN Outcome: Progressing Goal: Diagnostic test results will improve 04/09/2024 1052 by Norine Colman BIRCH, RN Outcome: Adequate for Discharge 04/09/2024 1051 by Norine Colman BIRCH, RN Outcome: Progressing Goal: Respiratory complications will improve 04/09/2024 1052 by Norine Colman BIRCH, RN Outcome: Adequate for Discharge 04/09/2024 1051 by Norine Colman BIRCH, RN Outcome: Progressing Goal: Cardiovascular complication will be avoided 04/09/2024 1052 by Norine Colman BIRCH, RN Outcome: Adequate for Discharge 04/09/2024 1051 by Norine Colman BIRCH, RN Outcome: Progressing   Problem: Activity: Goal: Risk for activity intolerance will decrease 04/09/2024 1052 by Norine Colman BIRCH, RN Outcome: Adequate for Discharge 04/09/2024 1051 by Norine Colman BIRCH, RN Outcome: Progressing   Problem: Nutrition: Goal: Adequate nutrition will be maintained 04/09/2024 1052 by Norine Colman BIRCH, RN Outcome: Adequate for Discharge 04/09/2024 1051 by Norine Colman BIRCH, RN Outcome: Progressing   Problem: Coping: Goal: Level of anxiety will decrease 04/09/2024 1052 by Norine Colman BIRCH, RN Outcome: Adequate for Discharge 04/09/2024 1051 by Norine Colman BIRCH, RN Outcome: Progressing   Problem: Elimination: Goal: Will not experience complications related to bowel motility 04/09/2024 1052 by Norine Colman BIRCH, RN Outcome: Adequate for Discharge 04/09/2024 1051 by Norine Colman BIRCH, RN Outcome: Progressing Goal: Will not experience complications related to urinary retention 04/09/2024 1052 by Norine Colman BIRCH, RN Outcome: Adequate for Discharge 04/09/2024 1051 by Norine Colman BIRCH, RN Outcome: Progressing   Problem: Pain Managment: Goal: General experience of comfort will improve and/or be controlled 04/09/2024 1052 by Norine Colman BIRCH, RN Outcome: Adequate for Discharge 04/09/2024 1051 by Norine Colman BIRCH, RN Outcome: Progressing   Problem: Safety: Goal: Ability to remain free from injury will improve 04/09/2024 1052 by Norine Colman BIRCH, RN Outcome: Adequate for Discharge 04/09/2024 1051 by Norine Colman BIRCH, RN Outcome: Progressing   Problem: Skin Integrity: Goal: Risk for impaired skin integrity will decrease 04/09/2024 1052 by Norine Colman BIRCH, RN Outcome: Adequate for Discharge 04/09/2024 1051 by Norine Colman BIRCH, RN Outcome: Progressing   Problem: Education: Goal: Ability to demonstrate management of disease process will improve 04/09/2024 1052 by Norine Colman BIRCH, RN Outcome: Adequate for Discharge 04/09/2024 1051 by Norine Colman BIRCH, RN Outcome: Progressing Goal: Ability to verbalize understanding of medication therapies will improve 04/09/2024 1052 by Norine Colman BIRCH, RN Outcome: Adequate for Discharge 04/09/2024 1051 by Norine Colman BIRCH, RN Outcome:  Progressing Goal: Individualized Educational Video(s) 04/09/2024 1052 by Norine Colman BIRCH, RN Outcome: Adequate for Discharge 04/09/2024 1051 by Norine Colman BIRCH, RN Outcome: Progressing   Problem: Activity: Goal: Capacity to carry out activities will improve 04/09/2024 1052 by Norine Colman BIRCH, RN Outcome: Adequate for Discharge 04/09/2024 1051 by Norine Colman BIRCH, RN Outcome: Progressing   Problem: Cardiac: Goal: Ability to achieve and  maintain adequate cardiopulmonary perfusion will improve 04/09/2024 1052 by Norine Colman BIRCH, RN Outcome: Adequate for Discharge 04/09/2024 1051 by Norine Colman BIRCH, RN Outcome: Progressing   "

## 2024-04-09 NOTE — Discharge Summary (Addendum)
 " Physician Discharge Summary   Patient: [name] MRN: [id number] DOB: July 28, 1945  Admit date:     04/07/2024  Discharge date: 04/09/2024  Discharge Physician: Sabas GORMAN Brod   PCP: Cleotilde Oneil FALCON, MD   Recommendations at discharge:   Patient to be discharged back to skilled nursing facility Check BMP in 3 days  Discharge Diagnoses: Principal Problem:   COPD exacerbation [facility]) Active Problems:   Acute on chronic respiratory failure with hypoxia and hypercapnia (HCC)   Elevated troponin   Acute on chronic HFrEF (heart failure with reduced ejection fraction) (HCC)   QT prolongation  Resolved Problems:   * No resolved hospital problems. *  Hospital Course: 78 y.o. male with medical history significant of COPD, chronic hypoxemic respiratory failure 4-6 L home oxygen , AAA status post endovascular repair, HFrEF, CAD, chronic hypotension on midodrine , hyperlipidemia, PVD, CVA, tobacco abuse, GERD, history of prostate cancer presents to the ED via EMS from his nursing facility for evaluation of shortness of breath, cough, and wheezing/rhonchi.  Per EMS, patient recently completed a course of antibiotics for treatment of pneumonia.  Patient was given 2 DuoNeb treatments and Solu-Medrol  125 mg prior to arrival with relief.  Vital signs on arrival to the ED: Temperature 97.5 F, pulse 84, respiratory rate 23, blood pressure 136/62, and SpO2 100% on 6 L supplemental oxygen .  EKG showing sinus rhythm, PACs, QTc 498, and no STEMI.  Labs showing no leukocytosis, proBNP 1027, troponin 54, COVID/influenza/RSV PCR negative.  Initial VBG showing pH 7.35 and pCO2 61.6.  Chest x-ray showing no active disease.  Patient was given albuterol  neb and IV Lasix  40 mg.  Repeat VBG showing improvement of pCO2 to 55.1.  TRH called to admit.   Patient is reporting 3-week history of progressively worsening dyspnea and productive cough.  Denies fevers or chest pain.  He is chronically on 3 L supplemental oxygen  24/7.   Stopped smoking cigarettes a year ago.  He is not sure which medications he is receiving at his nursing facility.  Reports chronic constipation for which he receives a laxative/stool softener at his nursing facility and denies nausea, vomiting, or abdominal pain.  He is not able to give any additional history.    Assessment and Plan:  Acute on chronic hypoxemic hypercarbic respiratory failure secondary to COPD exacerbation -Resolved, back to baseline - Presented with shortness of breath, cough and wheezing -Recently completed course of antibiotics for treatment of pneumonia -Chronically on 3 L of oxygen  at home; ABG showed improvement of hypercarbia  - Treated with IV Solu-Medrol  and then prednisone  40 mg daily.  Will discontinue steroids  as patient has significantly improved.   -Continue Mucinex  as needed, Tessalon  Perles as needed - Continue supplemental oxygen  3 L/min   Acute on chronic HFrEF -proBNP 1027, chest x-ray not suggestive of pulmonary edema -Echocardiogram 4 days ago showed EF of 30 to 35%, stable since prior echo in April 2025 -Received Lasix  40 mg IV in the ED -Lasix  was held due to soft BP -BP has now improved - Restart Lasix  at home dose of 40 mg p.o. twice daily   Elevated troponin History of CAD -Suspect elevated troponin is due to demand ischemia.   -EKG without STEMI and troponin 54.  Patient denies chest pain.   - Troponin is downtrending,   QT prolongation Potassium and magnesium  within normal range.     Chronic hypotension Continue midodrine .   Hyperlipidemia Continue Lipitor  and Zetia .   PVD History of CVA  Continue Plavix , Lipitor , and Zetia .   GERD Continue PPI.   Chronic constipation Continue Colace and Senokot-S.  MiraLAX  PRN.  CKD stage IIIa - Creatinine at baseline 1.30-1.39 -Likely in setting of diuretics -Check BMP at facility 3 days          Consultants:  Procedures performed:  Disposition: Skilled nursing facility Diet  recommendation:  Regular diet DISCHARGE MEDICATION: Allergies as of 04/09/2024       Reactions   Lyrica [pregabalin] Swelling           Medication List     STOP taking these medications    amoxicillin -clavulanate 875-125 MG tablet Commonly known as: AUGMENTIN        TAKE these medications    acetaminophen  325 MG tablet Commonly known as: TYLENOL  Take 2 tablets (650 mg total) by mouth every 6 (six) hours as needed for mild pain (or Fever >/= 101).   albuterol  108 (90 Base) MCG/ACT inhaler Commonly known as: VENTOLIN  HFA Inhale 2 puffs into the lungs every 6 (six) hours as needed for wheezing.   albuterol  (2.5 MG/3ML) 0.083% nebulizer solution Commonly known as: PROVENTIL  Take 2.5 mg by nebulization every 6 (six) hours as needed.   atorvastatin  80 MG tablet Commonly known as: LIPITOR  Take 1 tablet (80 mg total) by mouth daily.   benzonatate  100 MG capsule Commonly known as: TESSALON  Take 1 capsule (100 mg total) by mouth 3 (three) times daily as needed for cough.   clopidogrel  75 MG tablet Commonly known as: PLAVIX  Take 1 tablet (75 mg total) by mouth daily.   dapagliflozin  propanediol 10 MG Tabs tablet Commonly known as: FARXIGA  Take 1 tablet (10 mg total) by mouth daily.   docusate sodium  100 MG capsule Commonly known as: COLACE Take 100 mg by mouth 2 (two) times daily.   ezetimibe  10 MG tablet Commonly known as: ZETIA  Take 1 tablet (10 mg total) by mouth daily.   furosemide  20 MG tablet Commonly known as: LASIX  Take 20 mg by mouth See admin instructions. Take one tablet by mouth on Sunday, Tuesday, Wednesday, Thursday, and Saturday.   furosemide  40 MG tablet Commonly known as: LASIX  Take 40 mg by mouth 2 (two) times a week. Take one tablet my mouth on Monday and Friday.   guaiFENesin  600 MG 12 hr tablet Commonly known as: MUCINEX  Take 1 tablet (600 mg total) by mouth 2 (two) times daily for 5 days.   ipratropium-albuterol  0.5-2.5 (3) MG/3ML  Soln Commonly known as: DUONEB Use 1 vial twice a day (scheduled) and every 4 hours as needed for shortness of breath and wheezing as directed (Use twice a day scheduled and every 4 hours as needed for shortness of breath and wheezing) What changed:  how much to take how to take this when to take this reasons to take this additional instructions   lactose free nutrition Liqd Take 237 mLs by mouth 2 (two) times daily between meals.   Melatonin 10 MG Tabs Take 10 mg by mouth at bedtime.   midodrine  5 MG tablet Commonly known as: PROAMATINE  Take 1 tablet (5 mg total) by mouth 2 (two) times daily with a meal.   multivitamin with minerals tablet Take 1 tablet by mouth daily.   omeprazole  40 MG capsule Commonly known as: PRILOSEC TAKE 1 CAPSULE BY MOUTH DAILY USUALLY BEFORE BREAKFAST   senna-docusate 8.6-50 MG tablet Commonly known as: Senokot-S Take 1 tablet by mouth 2 (two) times daily.   spironolactone  25 MG tablet Commonly known  as: ALDACTONE  Take 0.5 tablets (12.5 mg total) by mouth daily. FOR CHRONIC COMBINED CHF   tiotropium 18 MCG inhalation capsule Commonly known as: Spiriva  HandiHaler Place 1 capsule (18 mcg total) into inhaler and inhale daily.        Contact information for follow-up providers     [facility]. Go on 04/24/2024.   Specialty: Cardiology Why: Hospital Follow-Up 04/24/24 @ 3:00 PM with Dr. Zenaida Please bring all medications to follow-up appointment [facility], [address] Floor Free Valet Parking at the door Contact information: [address] [place]  [address] [phone]             Contact information for after-discharge care     Destination     [facility] .   Service: Skilled Nursing Contact information: [address] [place]  [address] [phone]                     Discharge Exam: Fredricka Weights   04/07/24 1519 04/08/24 0300 04/09/24 0349  Weight: 71.6 kg 72.6 kg 77.9 kg   General-appears in no acute distress Heart-S1-S2, regular, no murmur auscultated Lungs-clear to auscultation bilaterally, no wheezing or crackles auscultated Abdomen-soft, nontender, no organomegaly Extremities-no edema in the lower extremities Neuro-alert, oriented x3, no focal deficit noted  Condition at discharge: good  The results of significant diagnostics from this hospitalization (including imaging, microbiology, ancillary and laboratory) are listed below for reference.   Imaging Studies: DG Chest Port 1 View Result Date: 04/07/2024 CLINICAL DATA:  Shortness of breath EXAM: PORTABLE CHEST 1 VIEW COMPARISON:  09/20/2023 FINDINGS: Cardiac shadow is within normal limits. Aortic calcifications are noted. Lungs are well aerated bilaterally. No focal infiltrate or effusion is noted. No bony abnormality is noted. IMPRESSION: No active disease. Electronically Signed   By: Oneil Devonshire M.D.   On: 04/07/2024 02:29   ECHOCARDIOGRAM COMPLETE BUBBLE STUDY Result Date: 04/03/2024    ECHOCARDIOGRAM REPORT   Patient Name:   [name] Date of Exam: 04/03/2024 Medical Rec #:  [id number]      Height:       72.0 in Accession #:    [id number]     Weight:       170.8 lb Date of Birth:  [birth year]      BSA:          1.992 m Patient Age:    78 years       BP:           132/76 mmHg Patient Gender: M              HR:           70 bpm. Exam Location:  [place] Procedure: 2D Echo, Saline Contrast Bubble Study, Cardiac Doppler, Color Doppler            and Intracardiac Opacification Agent (Both Spectral and Color Flow            Doppler were utilized during procedure). Indications:    CVA; I42.0 Dilated cardiomyopathy  History:        Patient has prior history of Echocardiogram examinations, most                 recent 07/17/2023. Hypertrophic Cardiomyopathy, CAD and Previous                  Myocardial Infarction, Stroke and COPD; Risk  Factors:Hypertension, Dyslipidemia and Current Smoker.  Sonographer:    Arley Pac RDMS, RVT, RDCS Referring Phys: [id number] BERNARDINO HERO DUNN  Sonographer Comments: Very TDS due to limited windows and difficulty positioning patient due to stroke symptoms IMPRESSIONS  1. Left ventricular ejection fraction, by estimation, is 30 to 35%. The left ventricle has moderately decreased function. The left ventricle demonstrates global hypokinesis. Left ventricular diastolic parameters are indeterminate.  2. Right ventricular systolic function is normal. The right ventricular size is normal. Tricuspid regurgitation signal is inadequate for assessing PA pressure.  3. The mitral valve is normal in structure. No evidence of mitral valve regurgitation. No evidence of mitral stenosis.  4. The aortic valve is normal in structure. Aortic valve regurgitation is not visualized. No aortic stenosis is present.  5. The inferior vena cava is normal in size with greater than 50% respiratory variability, suggesting right atrial pressure of 3 mmHg. FINDINGS  Left Ventricle: Left ventricular ejection fraction, by estimation, is 30 to 35%. The left ventricle has moderately decreased function. The left ventricle demonstrates global hypokinesis. Definity  contrast agent was given IV to delineate the left ventricular endocardial borders. Strain was performed and the global longitudinal strain is indeterminate. The left ventricular internal cavity size was normal in size. There is no left ventricular hypertrophy. Left ventricular diastolic parameters are indeterminate. Right Ventricle: The right ventricular size is normal. No increase in right ventricular wall thickness. Right ventricular systolic function is normal. Tricuspid regurgitation signal is inadequate for assessing PA pressure. Left Atrium: Left atrial size was normal in size. Right Atrium: Right atrial size was normal in size.  Pericardium: There is no evidence of pericardial effusion. Mitral Valve: The mitral valve is normal in structure. There is mild calcification of the mitral valve leaflet(s). No evidence of mitral valve regurgitation. No evidence of mitral valve stenosis. Tricuspid Valve: The tricuspid valve is normal in structure. Tricuspid valve regurgitation is not demonstrated. No evidence of tricuspid stenosis. Aortic Valve: The aortic valve is normal in structure. Aortic valve regurgitation is not visualized. No aortic stenosis is present. Aortic valve mean gradient measures 2.0 mmHg. Aortic valve peak gradient measures 3.6 mmHg. Aortic valve area, by VTI measures 3.87 cm. Pulmonic Valve: The pulmonic valve was normal in structure. Pulmonic valve regurgitation is not visualized. No evidence of pulmonic stenosis. Aorta: The aortic root is normal in size and structure. Venous: The inferior vena cava is normal in size with greater than 50% respiratory variability, suggesting right atrial pressure of 3 mmHg. IAS/Shunts: No atrial level shunt detected by color flow Doppler. Additional Comments: 3D was performed not requiring image post processing on an independent workstation and was indeterminate.  LEFT VENTRICLE PLAX 2D LVOT diam:     2.40 cm LV SV:         79 LV SV Index:   40 LVOT Area:     4.52 cm  RIGHT VENTRICLE RV S prime:     12.20 cm/s LEFT ATRIUM         Index LA diam:    5.00 cm 2.51 cm/m  AORTIC VALVE                    PULMONIC VALVE AV Area (Vmax):    3.52 cm     PV Vmax:       0.88 m/s AV Area (Vmean):   3.33 cm     PV Peak grad:  3.1 mmHg AV Area (VTI):     3.87 cm AV Vmax:  94.35 cm/s AV Vmean:          64.200 cm/s AV VTI:            0.205 m AV Peak Grad:      3.6 mmHg AV Mean Grad:      2.0 mmHg LVOT Vmax:         73.40 cm/s LVOT Vmean:        47.200 cm/s LVOT VTI:          0.175 m LVOT/AV VTI ratio: 0.86  AORTA Ao Root diam: 3.30 cm  SHUNTS Systemic VTI:  0.18 m Systemic Diam: 2.40 cm Evalene Lunger MD Electronically signed by Evalene Lunger MD Signature Date/Time: 04/03/2024/4:37:17 PM    Final     Microbiology: Results for orders placed or performed during the hospital encounter of 04/07/24  Resp panel by RT-PCR (RSV, Flu A&B, Covid) Anterior Nasal Swab     Status: None   Collection Time: 04/07/24  2:10 AM   Specimen: Anterior Nasal Swab  Result Value Ref Range Status   SARS Coronavirus 2 by RT PCR NEGATIVE NEGATIVE Final   Influenza A by PCR NEGATIVE NEGATIVE Final   Influenza B by PCR NEGATIVE NEGATIVE Final    Comment: (NOTE) The Xpert Xpress SARS-CoV-2/FLU/RSV plus assay is intended as an aid in the diagnosis of influenza from Nasopharyngeal swab specimens and should not be used as a sole basis for treatment. Nasal washings and aspirates are unacceptable for Xpert Xpress SARS-CoV-2/FLU/RSV testing.  Fact Sheet for Patients: bloggercourse.com  Fact Sheet for Healthcare Providers: seriousbroker.it  This test is not yet approved or cleared by the [place]  FDA and has been authorized for detection and/or diagnosis of SARS-CoV-2 by FDA under an Emergency Use Authorization (EUA). This EUA will remain in effect (meaning this test can be used) for the duration of the COVID-19 declaration under Section 564(b)(1) of the Act, 21 U.S.C. section 360bbb-3(b)(1), unless the authorization is terminated or revoked.     Resp Syncytial Virus by PCR NEGATIVE NEGATIVE Final    Comment: (NOTE) Fact Sheet for Patients: bloggercourse.com  Fact Sheet for Healthcare Providers: seriousbroker.it  This test is not yet approved or cleared by the [place]  FDA and has been authorized for detection and/or diagnosis of SARS-CoV-2 by FDA under an Emergency Use Authorization (EUA). This EUA will remain in effect (meaning this test can be used) for the duration of the COVID-19  declaration under Section 564(b)(1) of the Act, 21 U.S.C. section 360bbb-3(b)(1), unless the authorization is terminated or revoked.  Performed at [facility], [address], [place], [place] [address]     Labs: CBC: Recent Labs  Lab 04/07/24 0115 04/07/24 0218 04/07/24 0219 04/07/24 0415 04/08/24 0359  WBC 7.2  --   --   --  10.6*  NEUTROABS 3.9  --   --   --   --   HGB 13.9 14.6 14.6 13.3 12.4*  HCT 43.7 43.0 43.0 39.0 38.1*  MCV 98.4  --   --   --  96.5  PLT 172  --   --   --  179   Basic Metabolic Panel: Recent Labs  Lab 04/07/24 0115 04/07/24 0218 04/07/24 0219 04/07/24 0415 04/08/24 0359 04/09/24 0300  NA 142 142 143 142 141 138  K 4.3 4.2 4.2 3.7 4.2 3.9  CL 102 100  --   --  104 99  CO2 31  --   --   --  29  31  GLUCOSE 104* 98  --   --  117* 148*  BUN 17 20  --   --  22 27*  CREATININE 1.27* 1.30*  --   --  1.35* 1.39*  CALCIUM  8.9  --   --   --  8.9 8.7*  MG 2.3  --   --   --   --   --    Liver Function Tests: No results for input(s): AST, ALT, ALKPHOS, BILITOT, PROT, ALBUMIN in the last 168 hours. CBG: No results for input(s): GLUCAP in the last 168 hours.  Discharge time spent: greater than 30 minutes.  Signed: Sabas GORMAN Brod, MD [facility] 04/09/2024 "

## 2024-04-09 NOTE — Progress Notes (Signed)
 Called the facility at [facility] # [phone] and spoke with the receptionist twice and still can't get in touch with the nurse that will be getting report. Per receptionist they will call back to get report.

## 2024-04-09 NOTE — TOC Progression Note (Addendum)
 Transition of Care [facility]) - Progression Note    Patient Details  Name: HAYZE GAZDA MRN: [id number] Date of Birth: 1946-01-19  [facility] CM/SW Contact  Lendia Dais, [place] Phone Number: 04/09/2024, 9:06 AM  Clinical Narrative:  Shara for ambulance was approved ref # [id number] and SNF shara was denied. P2P offered w/ a deadline of 3PM with Dr. Janit at [phone]. MD notified.  MD notified the CSW that the peer to peer was denied but denied for STR. CSW spoke to Darrien of [facility] who stated that they would still be able to return to LTC.  1550 - Jori informed the CSW of denial paperwork via phone, CSW requested for connie to sent the paperwork via mail due to the pt already discharging.  CSW will continue to follow.     Expected Discharge Plan: Skilled Nursing Facility Barriers to Discharge: Continued Medical Work up               Expected Discharge Plan and Services In-house Referral: Clinical Social Work   Post Acute Care Choice: Skilled Nursing Facility Living arrangements for the past 2 months: Skilled Nursing Facility                                       Social Drivers of Health (SDOH) Interventions SDOH Screenings   Food Insecurity: No Food Insecurity (04/07/2024)  Housing: Low Risk (04/07/2024)  Transportation Needs: No Transportation Needs (04/07/2024)  Utilities: Not At Risk (04/08/2024)  Financial Resource Strain: Low Risk  (01/03/2023)   Received from [facility]  Social Connections: Socially Isolated (04/07/2024)  Tobacco Use: High Risk (03/04/2024)    Readmission Risk Interventions     No data to display

## 2024-04-09 NOTE — TOC Transition Note (Addendum)
 Transition of Care [facility]) - Discharge Note   Patient Details  Name: KOLLYN LINGAFELTER MRN: [id number] Date of Birth: [birth year]  Transition of [facility]) CM/SW Contact:  Lendia Dais, LCSWA Phone Number: 04/09/2024, 9:47 AM   Clinical Narrative:  Pt is discharging and returning to [facility]. RN report to [phone] Rm # [phone]. [facility] called at 10AM.  CSW spoke to pt's son Glendia and informed him of discharge and transport.  No further TOC needs.       Final next level of care: Long Term Nursing Home Barriers to Discharge: Barriers Resolved   Patient Goals and CMS Choice Patient states their goals for this hospitalization and ongoing recovery are:: To return to ltc          Discharge Placement              Patient chooses bed at: [facility] Patient to be transferred to facility by: [facility] Name of family member notified: Glendia (son) Patient and family notified of of transfer: 04/09/24  Discharge Plan and Services Additional resources added to the After Visit Summary for   In-house Referral: Clinical Social Work   Post Acute Care Choice: Skilled Nursing Facility                               Social Drivers of Health (SDOH) Interventions SDOH Screenings   Food Insecurity: No Food Insecurity (04/07/2024)  Housing: Low Risk (04/07/2024)  Transportation Needs: No Transportation Needs (04/07/2024)  Utilities: Not At Risk (04/08/2024)  Financial Resource Strain: Low Risk  (01/03/2023)   Received from [facility]  Social Connections: Socially Isolated (04/07/2024)  Tobacco Use: High Risk (03/04/2024)     Readmission Risk Interventions     No data to display

## 2024-04-09 NOTE — Plan of Care (Signed)

## 2024-04-13 LAB — ECHOCARDIOGRAM COMPLETE BUBBLE STUDY
AR max vel: 3.52 cm2
AV Area VTI: 3.87 cm2
AV Area mean vel: 3.33 cm2
AV Mean grad: 2 mmHg
AV Peak grad: 3.6 mmHg
Ao pk vel: 0.94 m/s

## 2024-04-23 ENCOUNTER — Telehealth: Payer: Self-pay | Admitting: Cardiology

## 2024-04-23 NOTE — Telephone Encounter (Signed)
 Called to confirm/remind patient of their appointment at the [facility] on 1/826.   Appointment:   [x] Confirmed  [] Left mess   [] No answer/No voice mail  [] VM Full/unable to leave message  [] Phone not in service  Patient reminded to bring all medications and/or complete list.  Confirmed patient has transportation. Gave directions, instructed to utilize valet parking.

## 2024-04-24 ENCOUNTER — Encounter: Payer: Self-pay | Admitting: Cardiology

## 2024-04-24 ENCOUNTER — Ambulatory Visit: Attending: Cardiology | Admitting: Cardiology

## 2024-04-24 VITALS — BP 142/77 | HR 66 | Wt 163.5 lb

## 2024-04-24 DIAGNOSIS — Z79899 Other long term (current) drug therapy: Secondary | ICD-10-CM | POA: Insufficient documentation

## 2024-04-24 DIAGNOSIS — Z8673 Personal history of transient ischemic attack (TIA), and cerebral infarction without residual deficits: Secondary | ICD-10-CM | POA: Diagnosis not present

## 2024-04-24 DIAGNOSIS — Z7902 Long term (current) use of antithrombotics/antiplatelets: Secondary | ICD-10-CM | POA: Diagnosis not present

## 2024-04-24 DIAGNOSIS — Z955 Presence of coronary angioplasty implant and graft: Secondary | ICD-10-CM | POA: Diagnosis not present

## 2024-04-24 DIAGNOSIS — Z9889 Other specified postprocedural states: Secondary | ICD-10-CM | POA: Insufficient documentation

## 2024-04-24 DIAGNOSIS — J449 Chronic obstructive pulmonary disease, unspecified: Secondary | ICD-10-CM | POA: Insufficient documentation

## 2024-04-24 DIAGNOSIS — I5022 Chronic systolic (congestive) heart failure: Secondary | ICD-10-CM | POA: Diagnosis present

## 2024-04-24 DIAGNOSIS — I2510 Atherosclerotic heart disease of native coronary artery without angina pectoris: Secondary | ICD-10-CM | POA: Insufficient documentation

## 2024-04-24 DIAGNOSIS — R0602 Shortness of breath: Secondary | ICD-10-CM | POA: Diagnosis present

## 2024-04-24 MED ORDER — TRELEGY ELLIPTA 100-62.5-25 MCG/ACT IN AEPB: 2.0000 | INHALATION_SPRAY | Freq: Two times a day (BID) | RESPIRATORY_TRACT | 1 refills | Status: AC

## 2024-04-24 NOTE — Patient Instructions (Signed)
 Medication Changes:  START Trelidgy  STOP Midodrine   Referrals:  You have been referred to [facility]. They will contact you in order to schedule an appointment.   Follow-Up in: Please follow up with the [facility] in 6 months with Ellouise Class, FNP.   Thank you for choosing [facility].    At the [facility], you and your health needs are our priority. We have a designated team specialized in the treatment of Heart Failure. This Care Team includes your primary Heart Failure Specialized Cardiologist (physician), Advanced Practice Providers (APPs- Physician Assistants and Nurse Practitioners), and Pharmacist who all work together to provide you with the care you need, when you need it.   You may see any of the following providers on your designated Care Team at your next follow up:  Dr. Toribio Fuel Dr. Ezra Shuck Dr. Ria Commander Dr. Morene Brownie Ellouise Class, FNP Jaun Bash, RPH-CPP  Please be sure to bring in all your medications bottles to every appointment.   Need to Contact Us :  If you have any questions or concerns before your next appointment please send us  a message through [place] or call our office at [phone].    TO LEAVE A MESSAGE FOR THE NURSE SELECT OPTION 2, PLEASE LEAVE A MESSAGE INCLUDING: YOUR NAME DATE OF BIRTH CALL BACK NUMBER REASON FOR CALL**this is important as we prioritize the call backs  YOU WILL RECEIVE A CALL BACK THE SAME DAY AS LONG AS YOU CALL BEFORE 4:00 PM

## 2024-04-26 NOTE — Progress Notes (Unsigned)
 "  ADVANCED HEART FAILURE FOLLOW UP CLINIC NOTE  Referring Physician: Cleotilde Oneil FALCON, MD  Primary Care: Cleotilde Oneil FALCON, MD Primary Cardiologist:  HPI: [name] is a 79 y.o. male who presents for follow up of chronic systolic heart failure.      He underwent Myoview  in 2010 that was negative for ischemia with an EF of 60%. In 2012, nuclear stress testing showed anterior wall ischemia with an estimated EF of 26%. Follow-up cardiac cath on 01/05/2011 showed 95% stenosis of the proximal LAD, 95% stenosis of the mid LAD, 30% in-stent restenosis of the mid left circumflex with a second lesion of diffuse 50% stenosis.  The patient underwent successful PCI/BMS to the mid LAD with 0% residual stenosis    Admitted to the hospital in 04/2021 with acute on chronic combined CHF and elevated troponin peaking at 448, felt to be related to demand ischemia.  Was improved with IV diuresis. Echo in 04/2021 demonstrating an EF of 30 to 35% with global hypokinesis, mild, grade 1 diastolic dysfunction, normal RV systolic function with mildly enlarged cavity size, PASP 54.5 mmHg, mildly dilated left atrium, severely dilated right atrium and estimated right atrial pressure of 8 mmHg.    Admitted 12/12/22 due to new onset of productive cough, shortness of breath and wheezing for 3 days prior to presentation. In the ED patient was hypoxic with pulse ox of 80% on room air. Chest x-ray showed pulmonary vascular congestion. Given IV lasix . Echocardiogram this admission shows LV ejection fraction of 40 to 45% with global hypokinesis. Improvement in ejection fraction compared to echo from 04/2021. Qualified for home oxygen .  He was seen recently in clinic on 9/3 at which time his BP in his left arm read with systolics in the 50s. BP on the right with systolic in the 80s. His furosemide  was held, GDMT decreased, and he was arranged to have close follow up.       SUBJECTIVE:  Patient overall reports that he has been very  short of breath since leaving the hospital. Time he feels better is right after doing his rescue nebulizer therapy.  He has been short of breath like this for some time.  He is not on any controller therapy.  Denies any worsening orthopnea, lower extremity swelling.  Does get occasional chest discomfort, but only when wheezing or short of breath, improves with albuterol  therapy.  Blood pressure values have largely been normal to hypertensive at home.  PMH, current medications, allergies, social history, and family history reviewed in epic.  PHYSICAL EXAM: Vitals:   04/24/24 1503  BP: (!) 142/77  Pulse: 66  SpO2: 95%   GENERAL: Elderly, ill-appearing PULM: Significant end expiratory wheezing CARDIAC:  JVP: Flat         Normal rate with regular rhythm. No murmurs, rubs or gallops.  Trace edema. Warm and well perfused extremities. ABDOMEN: Soft, non-tender, non-distended. NEUROLOGIC: Patient is oriented x3 with no focal or lateralizing neurologic deficits.     ASSESSMENT & PLAN:  Chronic systolic heart failure: NYHA class IV symptoms, multifactorial and suspect at this point largely due to COPD.  Off the majority of GDMT and now on midodrine  given recurrent admissions.  Appears euvolemic.  Suspected ischemic - Continue spironolactone  12.5 mg daily, Farxiga  10 mg daily - Stop midodrine  - Continue Lasix  40 mg twice weekly, 20 mg on the remaining days - Not a candidate for advanced therapies - Heart rate borderline, if improves in the future would choose bisoprolol   given COPD - Will assess blood pressure off midodrine  to see if he can tolerate ACE/ARB  COPD: End-stage, on home oxygen , significant wheezing on exam.  Unfortunately not on therapy besides as needed albuterol . -Start Trelegy 2 puffs 2 times daily - Pulmonary follow-up - Given multiple admissions and failure to thrive could consider palliative care consult in the future  Coronary artery disease: - Continue Plavix  75 mg  daily, atorvastatin  80 mg daily, ezetimibe  10 mg daily - Suspect above angina more likely related to uncontrolled COPD  CVA: 07/2023 with right MCA territory infarct. - Suspect due to known cerebrovascular disease  AAA: Status post endovascular repair in 2019.   Follow up in 6 months with APP  Morene Brownie, MD Advanced Heart Failure Mechanical Circulatory Support 04/26/2024 "

## 2024-05-06 ENCOUNTER — Ambulatory Visit: Admitting: Physician Assistant

## 2024-05-07 ENCOUNTER — Ambulatory Visit: Admitting: Pulmonary Disease

## 2024-05-07 ENCOUNTER — Encounter: Payer: Self-pay | Admitting: Pulmonary Disease

## 2024-05-07 VITALS — BP 136/76 | HR 61 | Temp 97.8°F | Ht 72.0 in | Wt 167.8 lb

## 2024-05-07 DIAGNOSIS — I5022 Chronic systolic (congestive) heart failure: Secondary | ICD-10-CM

## 2024-05-07 DIAGNOSIS — Z87891 Personal history of nicotine dependence: Secondary | ICD-10-CM | POA: Diagnosis not present

## 2024-05-07 DIAGNOSIS — J9611 Chronic respiratory failure with hypoxia: Secondary | ICD-10-CM

## 2024-05-07 DIAGNOSIS — J449 Chronic obstructive pulmonary disease, unspecified: Secondary | ICD-10-CM

## 2024-05-07 DIAGNOSIS — J439 Emphysema, unspecified: Secondary | ICD-10-CM

## 2024-05-07 MED ORDER — ALBUTEROL SULFATE HFA 108 (90 BASE) MCG/ACT IN AERS: 2.0000 | INHALATION_SPRAY | Freq: Four times a day (QID) | RESPIRATORY_TRACT | 2 refills | Status: AC | PRN

## 2024-05-07 NOTE — Progress Notes (Signed)
 "   Synopsis: Referred in by Zenaida Morene PARAS, MD   Subjective:   PATIENT ID: [name] GENDER: male DOB: January 20, 1946, MRN: [id number]  Chief Complaint  Patient presents with   Consult    ER in December. SOB. Wheezing. Cough with brown sputum. Has been diagnosed with Emphysema. Trelegy- daily, does not know if it helps. Spiriva - daily. Duoneb- helps with is breathing     HPI Discussed the use of AI scribe software for clinical note transcription with the patient, who gave verbal consent to proceed.  History of Present Illness   [name] is an 79 year old male with COPD who presents with respiratory symptoms. He was referred by his heart specialist for COPD management.  He has a long-standing history of COPD and was recently started on Trelegy inhaler two weeks ago at the heart failure clinic. There has been no significant change in symptoms since starting the inhaler. He does not currently have a rescue inhaler, although he used to have one at home.  He experiences episodes of shortness of breath, particularly when upset, which is accompanied by chest pain. He also has a productive cough with brown sputum, which he associates with past episodes of pneumonia, the last of which occurred in November. Wheezing occurs occasionally and was more pronounced during his visit to the heart failure clinic.  He quit smoking about a year ago, having previously smoked five packs a day since he was 49 or 79 years old. He has been on oxygen  therapy since 2023, using three liters continuously. He mostly sits at home due to poor balance, which he attributes to a past stroke that left his left side weak.  He denies any recent weight loss and describes his appetite as 'all right'. He follows a bland diet but can eat other foods brought to him. He has occasional difficulty swallowing.       Family History  Problem Relation Age of Onset   Heart attack Mother    Diabetes Mother    Heart disease  Father    Lung cancer Daughter      Social History   Socioeconomic History   Marital status: Married    Spouse name: Not on file   Number of children: Not on file   Years of education: Not on file   Highest education level: Not on file  Occupational History   Not on file  Tobacco Use   Smoking status: Former    Current packs/day: 0.00    Average packs/day: 0.3 packs/day for 56.0 years (14.0 ttl pk-yrs)    Types: Cigarettes    Quit date: 06/2023    Years since quitting: 0.8   Smokeless tobacco: Never   Tobacco comments:    Quit smoking in 06/2023.        Started smoking at 79 years old.    Smoked 1.5 PPD at his heaviest.  Vaping Use   Vaping status: Never Used  Substance and Sexual Activity   Alcohol use: No    Alcohol/week: 0.0 standard drinks of alcohol   Drug use: No   Sexual activity: Not Currently  Other Topics Concern   Not on file  Social History Narrative   Not on file   Social Drivers of Health   Tobacco Use: Medium Risk (05/07/2024)   Patient History    Smoking Tobacco Use: Former    Smokeless Tobacco Use: Never    Passive Exposure: Not on Actuary Strain: Low  Risk  (01/03/2023)   Received from [facility]   Overall Financial Resource Strain (CARDIA)    Difficulty of Paying Living Expenses: Not hard at all  Food Insecurity: No Food Insecurity (04/07/2024)   Epic    Worried About Running Out of Food in the Last Year: Never true    Ran Out of Food in the Last Year: Never true  Transportation Needs: No Transportation Needs (04/07/2024)   Epic    Lack of Transportation (Medical): No    Lack of Transportation (Non-Medical): No  Physical Activity: Not on file  Stress: Not on file  Social Connections: Socially Isolated (04/07/2024)   Social Connection and Isolation Panel    Frequency of Communication with Friends and Family: Three times a week    Frequency of Social Gatherings with Friends and Family: Three times a week     Attends Religious Services: Never    Active Member of Clubs or Organizations: No    Attends Banker Meetings: Never    Marital Status: Widowed  Intimate Partner Violence: Not At Risk (04/07/2024)   Epic    Fear of Current or Ex-Partner: No    Emotionally Abused: No    Physically Abused: No    Sexually Abused: No  Depression (PHQ2-9): Not on file  Alcohol Screen: Not on file  Housing: Low Risk (04/07/2024)   Epic    Unable to Pay for Housing in the Last Year: No    Number of Times Moved in the Last Year: 0    Homeless in the Last Year: No  Utilities: Not At Risk (04/08/2024)   Epic    Threatened with loss of utilities: No  Health Literacy: Not on file        Objective:   Vitals:   05/07/24 0929  BP: 136/76  Pulse: 61  Temp: 97.8 F (36.6 C)  SpO2: 99%  Weight: 167 lb 12.8 oz (76.1 kg)  Height: 6' (1.829 m)   99% on RA BMI Readings from Last 3 Encounters:  05/07/24 22.76 kg/m  04/24/24 22.17 kg/m  04/09/24 23.29 kg/m   Wt Readings from Last 3 Encounters:  05/07/24 167 lb 12.8 oz (76.1 kg)  04/24/24 163 lb 8 oz (74.2 kg)  04/09/24 171 lb 11.8 oz (77.9 kg)    Physical Exam GEN: NAD, Healthy Appearing HEENT: Supple Neck, Reactive Pupils, EOMI  CVS: Normal S1, Normal S2, RRR, No murmurs or ES appreciated  Lungs: Faint expiratory wheezing.  Abdomen: Soft, non tender, non distended, + BS  Extremities: Warm and well perfused, No edema   Ancillary Information   CBC    Component Value Date/Time   WBC 10.6 (H) 04/08/2024 0359   RBC 3.95 (L) 04/08/2024 0359   HGB 12.4 (L) 04/08/2024 0359   HGB 14.4 12/19/2022 1039   HCT 38.1 (L) 04/08/2024 0359   HCT 43.8 12/19/2022 1039   PLT 179 04/08/2024 0359   PLT 200 12/19/2022 1039   MCV 96.5 04/08/2024 0359   MCV 96 12/19/2022 1039   MCV 93 02/27/2012 1406   MCH 31.4 04/08/2024 0359   MCHC 32.5 04/08/2024 0359   RDW 12.5 04/08/2024 0359   RDW 11.8 12/19/2022 1039   RDW 12.9 02/27/2012 1406    LYMPHSABS 2.2 04/07/2024 0115   LYMPHSABS 2.1 02/27/2012 1406   MONOABS 0.6 04/07/2024 0115   MONOABS 0.6 02/27/2012 1406   EOSABS 0.4 04/07/2024 0115   EOSABS 0.4 02/27/2012 1406   BASOSABS 0.0 04/07/2024 0115  BASOSABS 0.1 02/27/2012 1406         No data to display           Assessment & Plan:  Assessment and Plan    #Chronic obstructive pulmonary disease COPD with intermittent wheezing and shortness of breath. Wheezing improved with Trelegy Ellipta . Occasional chest tightness and brown sputum. Smoking cessation for one year. Balance issues from previous stroke. - Continue Trelegy inhaler 100 1 puff daily. Advised mouth rinsing after each use - Prescribed rescue inhaler for chest tightness or shortness of breath. - Ordered pulmonary function test to assess degree of COPD. - Declined pulmonary rehab due to transportation. Continue current physical therapy at long-term care facility twice a week.  #Chronic hypoxic respiratory failure on 3L/[place].  Assess spO2 at rest on RA and maintained sats at 95%. Does not require oxygen  supp at rest. On supplemental oxygen  at 3 liters per minute since 2023. Primarily sedentary due to balance issues from previous stroke. - Continue supplemental oxygen  at 3 liters per minute on exertion and frequent pulse ox monitoring.     #Chronic systolic heart failure: ICM. NYHA class IV. Appears euvolemic on exam. Trace LE edema.  - On Spirinolactone, Farxiga  and lasix .  - Follow up with advance heart failure.   #CVA 07/2023 with aphasia and left pesistent hemiparesis.    Return in about 3 months (around 08/05/2024).  I personally spent a total of 60 minutes in the care of the patient today including preparing to see the patient, getting/reviewing separately obtained history, performing a medically appropriate exam/evaluation, counseling and educating, placing orders, documenting clinical information in the EHR, independently interpreting results, and  communicating results.   Darrin Barn, MD [facility] 05/07/2024 9:59 AM  "

## 2024-05-12 DIAGNOSIS — I639 Cerebral infarction, unspecified: Secondary | ICD-10-CM

## 2024-05-13 ENCOUNTER — Ambulatory Visit: Admitting: Cardiovascular Disease

## 2024-05-14 ENCOUNTER — Other Ambulatory Visit: Payer: Self-pay | Admitting: Emergency Medicine

## 2024-05-14 DIAGNOSIS — I493 Ventricular premature depolarization: Secondary | ICD-10-CM

## 2024-05-14 DIAGNOSIS — I639 Cerebral infarction, unspecified: Secondary | ICD-10-CM

## 2024-05-20 ENCOUNTER — Ambulatory Visit: Admitting: Cardiovascular Disease

## 2024-05-20 DIAGNOSIS — E785 Hyperlipidemia, unspecified: Secondary | ICD-10-CM

## 2024-05-20 DIAGNOSIS — I420 Dilated cardiomyopathy: Secondary | ICD-10-CM

## 2024-05-20 DIAGNOSIS — I25118 Atherosclerotic heart disease of native coronary artery with other forms of angina pectoris: Secondary | ICD-10-CM

## 2024-05-20 DIAGNOSIS — I10 Essential (primary) hypertension: Secondary | ICD-10-CM

## 2024-05-20 DIAGNOSIS — I5042 Chronic combined systolic (congestive) and diastolic (congestive) heart failure: Secondary | ICD-10-CM

## 2024-05-20 DIAGNOSIS — I714 Abdominal aortic aneurysm, without rupture, unspecified: Secondary | ICD-10-CM

## 2024-06-02 ENCOUNTER — Ambulatory Visit: Admitting: Physician Assistant

## 2024-07-09 ENCOUNTER — Encounter

## 2024-07-09 ENCOUNTER — Ambulatory Visit: Admitting: Pulmonary Disease

## 2024-07-29 ENCOUNTER — Ambulatory Visit: Admitting: Cardiovascular Disease

## 2024-08-12 ENCOUNTER — Ambulatory Visit: Admitting: Cardiology

## 2024-10-23 ENCOUNTER — Ambulatory Visit: Admitting: Family
